# Patient Record
Sex: Male | Born: 1970
Health system: Southern US, Community
[De-identification: ages and names within clinical notes are randomized; demographics above are authoritative.]

## PROBLEM LIST (undated history)

## (undated) DIAGNOSIS — E669 Obesity, unspecified: Secondary | ICD-10-CM

## (undated) DIAGNOSIS — I639 Cerebral infarction, unspecified: Secondary | ICD-10-CM

## (undated) DIAGNOSIS — J189 Pneumonia, unspecified organism: Secondary | ICD-10-CM

## (undated) DIAGNOSIS — I5022 Chronic systolic (congestive) heart failure: Secondary | ICD-10-CM

## (undated) DIAGNOSIS — I4892 Unspecified atrial flutter: Secondary | ICD-10-CM

## (undated) DIAGNOSIS — N189 Chronic kidney disease, unspecified: Secondary | ICD-10-CM

## (undated) DIAGNOSIS — I428 Other cardiomyopathies: Secondary | ICD-10-CM

## (undated) DIAGNOSIS — Z95810 Presence of automatic (implantable) cardiac defibrillator: Secondary | ICD-10-CM

## (undated) DIAGNOSIS — I10 Essential (primary) hypertension: Secondary | ICD-10-CM

## (undated) DIAGNOSIS — I4819 Other persistent atrial fibrillation: Secondary | ICD-10-CM

## (undated) DIAGNOSIS — I219 Acute myocardial infarction, unspecified: Secondary | ICD-10-CM

## (undated) DIAGNOSIS — E119 Type 2 diabetes mellitus without complications: Secondary | ICD-10-CM

## (undated) DIAGNOSIS — E785 Hyperlipidemia, unspecified: Secondary | ICD-10-CM

## (undated) DIAGNOSIS — Z8739 Personal history of other diseases of the musculoskeletal system and connective tissue: Secondary | ICD-10-CM

## (undated) HISTORY — DX: Essential (primary) hypertension: I10

## (undated) HISTORY — DX: Obesity, unspecified: E66.9

## (undated) HISTORY — DX: Hyperlipidemia, unspecified: E78.5

## (undated) HISTORY — DX: Other cardiomyopathies: I42.8

## (undated) HISTORY — DX: Chronic systolic (congestive) heart failure: I50.22

---

## 1998-07-19 ENCOUNTER — Emergency Department (HOSPITAL_COMMUNITY): Admission: EM | Admit: 1998-07-19 | Discharge: 1998-07-19 | Payer: Self-pay | Admitting: Emergency Medicine

## 1998-07-20 ENCOUNTER — Emergency Department (HOSPITAL_COMMUNITY): Admission: EM | Admit: 1998-07-20 | Discharge: 1998-07-20 | Payer: Self-pay | Admitting: Emergency Medicine

## 1998-07-22 ENCOUNTER — Emergency Department (HOSPITAL_COMMUNITY): Admission: EM | Admit: 1998-07-22 | Discharge: 1998-07-22 | Payer: Self-pay | Admitting: Emergency Medicine

## 1998-07-25 ENCOUNTER — Emergency Department (HOSPITAL_COMMUNITY): Admission: EM | Admit: 1998-07-25 | Discharge: 1998-07-25 | Payer: Self-pay | Admitting: Emergency Medicine

## 2003-09-15 ENCOUNTER — Inpatient Hospital Stay (HOSPITAL_COMMUNITY): Admission: EM | Admit: 2003-09-15 | Discharge: 2003-09-21 | Payer: Self-pay | Admitting: Emergency Medicine

## 2003-09-16 ENCOUNTER — Encounter: Payer: Self-pay | Admitting: Cardiovascular Disease

## 2003-09-28 ENCOUNTER — Emergency Department (HOSPITAL_COMMUNITY): Admission: EM | Admit: 2003-09-28 | Discharge: 2003-09-28 | Payer: Self-pay | Admitting: Family Medicine

## 2003-10-19 ENCOUNTER — Emergency Department (HOSPITAL_COMMUNITY): Admission: EM | Admit: 2003-10-19 | Discharge: 2003-10-19 | Payer: Self-pay | Admitting: Family Medicine

## 2004-08-28 ENCOUNTER — Inpatient Hospital Stay (HOSPITAL_COMMUNITY): Admission: EM | Admit: 2004-08-28 | Discharge: 2004-09-01 | Payer: Self-pay | Admitting: Emergency Medicine

## 2004-08-29 ENCOUNTER — Encounter (INDEPENDENT_AMBULATORY_CARE_PROVIDER_SITE_OTHER): Payer: Self-pay | Admitting: Cardiovascular Disease

## 2004-11-23 ENCOUNTER — Inpatient Hospital Stay (HOSPITAL_COMMUNITY): Admission: EM | Admit: 2004-11-23 | Discharge: 2004-11-25 | Payer: Self-pay | Admitting: Emergency Medicine

## 2004-11-23 ENCOUNTER — Ambulatory Visit: Payer: Self-pay | Admitting: Internal Medicine

## 2004-12-08 ENCOUNTER — Ambulatory Visit: Payer: Self-pay | Admitting: Internal Medicine

## 2004-12-21 ENCOUNTER — Ambulatory Visit: Payer: Self-pay | Admitting: Internal Medicine

## 2004-12-22 ENCOUNTER — Ambulatory Visit: Payer: Self-pay | Admitting: Internal Medicine

## 2005-01-05 ENCOUNTER — Ambulatory Visit: Payer: Self-pay | Admitting: Hospitalist

## 2005-01-05 ENCOUNTER — Ambulatory Visit (HOSPITAL_COMMUNITY): Admission: RE | Admit: 2005-01-05 | Discharge: 2005-01-05 | Payer: Self-pay | Admitting: Internal Medicine

## 2005-01-23 ENCOUNTER — Ambulatory Visit: Payer: Self-pay | Admitting: Internal Medicine

## 2005-01-31 ENCOUNTER — Ambulatory Visit: Payer: Self-pay | Admitting: Internal Medicine

## 2005-02-08 ENCOUNTER — Ambulatory Visit: Payer: Self-pay | Admitting: Internal Medicine

## 2005-04-21 ENCOUNTER — Ambulatory Visit: Payer: Self-pay | Admitting: Internal Medicine

## 2005-06-10 ENCOUNTER — Emergency Department (HOSPITAL_COMMUNITY): Admission: AD | Admit: 2005-06-10 | Discharge: 2005-06-10 | Payer: Self-pay | Admitting: Family Medicine

## 2005-06-13 ENCOUNTER — Encounter: Payer: Self-pay | Admitting: Emergency Medicine

## 2005-06-13 ENCOUNTER — Encounter (INDEPENDENT_AMBULATORY_CARE_PROVIDER_SITE_OTHER): Payer: Self-pay | Admitting: Cardiovascular Disease

## 2005-06-13 ENCOUNTER — Ambulatory Visit: Payer: Self-pay | Admitting: Internal Medicine

## 2005-06-13 ENCOUNTER — Inpatient Hospital Stay (HOSPITAL_COMMUNITY): Admission: EM | Admit: 2005-06-13 | Discharge: 2005-06-15 | Payer: Self-pay | Admitting: Internal Medicine

## 2005-06-19 ENCOUNTER — Ambulatory Visit: Payer: Self-pay | Admitting: Hospitalist

## 2005-06-23 ENCOUNTER — Ambulatory Visit: Payer: Self-pay | Admitting: Internal Medicine

## 2005-07-19 ENCOUNTER — Ambulatory Visit: Payer: Self-pay | Admitting: Internal Medicine

## 2005-08-25 ENCOUNTER — Ambulatory Visit: Payer: Self-pay | Admitting: Internal Medicine

## 2005-12-26 DIAGNOSIS — E119 Type 2 diabetes mellitus without complications: Secondary | ICD-10-CM | POA: Insufficient documentation

## 2005-12-26 DIAGNOSIS — E1159 Type 2 diabetes mellitus with other circulatory complications: Secondary | ICD-10-CM | POA: Insufficient documentation

## 2005-12-26 DIAGNOSIS — E669 Obesity, unspecified: Secondary | ICD-10-CM | POA: Insufficient documentation

## 2005-12-26 DIAGNOSIS — E785 Hyperlipidemia, unspecified: Secondary | ICD-10-CM

## 2005-12-26 DIAGNOSIS — E1169 Type 2 diabetes mellitus with other specified complication: Secondary | ICD-10-CM | POA: Insufficient documentation

## 2005-12-26 DIAGNOSIS — I152 Hypertension secondary to endocrine disorders: Secondary | ICD-10-CM | POA: Insufficient documentation

## 2005-12-26 DIAGNOSIS — I10 Essential (primary) hypertension: Secondary | ICD-10-CM

## 2006-03-08 ENCOUNTER — Emergency Department (HOSPITAL_COMMUNITY): Admission: EM | Admit: 2006-03-08 | Discharge: 2006-03-08 | Payer: Self-pay | Admitting: Emergency Medicine

## 2006-04-20 ENCOUNTER — Telehealth: Payer: Self-pay | Admitting: *Deleted

## 2006-05-17 ENCOUNTER — Ambulatory Visit: Payer: Self-pay | Admitting: Internal Medicine

## 2006-05-17 ENCOUNTER — Encounter (INDEPENDENT_AMBULATORY_CARE_PROVIDER_SITE_OTHER): Payer: Self-pay | Admitting: Internal Medicine

## 2006-05-17 LAB — CONVERTED CEMR LAB
Blood Glucose, Fingerstick: 109
Hgb A1c MFr Bld: 6.8 %

## 2006-05-23 LAB — CONVERTED CEMR LAB
ALT: 14 units/L (ref 0–53)
AST: 17 units/L (ref 0–37)
Albumin: 4 g/dL (ref 3.5–5.2)
Alkaline Phosphatase: 98 units/L (ref 39–117)
BUN: 12 mg/dL (ref 6–23)
Basophils Absolute: 0 10*3/uL (ref 0.0–0.1)
Basophils Relative: 0 % (ref 0–1)
CO2: 25 meq/L (ref 19–32)
Calcium: 9.2 mg/dL (ref 8.4–10.5)
Chloride: 103 meq/L (ref 96–112)
Creatinine, Ser: 1.4 mg/dL (ref 0.40–1.50)
Creatinine, Urine: 186.1 mg/dL
Eosinophils Absolute: 0.2 10*3/uL (ref 0.0–0.7)
Eosinophils Relative: 4 % (ref 0–5)
Glucose, Bld: 94 mg/dL (ref 70–99)
HCT: 39 % (ref 39.0–52.0)
Hemoglobin: 12.2 g/dL — ABNORMAL LOW (ref 13.0–17.0)
Lymphocytes Relative: 32 % (ref 12–46)
Lymphs Abs: 1.7 10*3/uL (ref 0.7–3.3)
MCHC: 31.3 g/dL (ref 30.0–36.0)
MCV: 90.3 fL (ref 78.0–100.0)
Microalb Creat Ratio: 17.9 mg/g (ref 0.0–30.0)
Microalb, Ur: 3.34 mg/dL — ABNORMAL HIGH (ref 0.00–1.89)
Monocytes Absolute: 0.3 10*3/uL (ref 0.2–0.7)
Monocytes Relative: 5 % (ref 3–11)
Neutro Abs: 3.2 10*3/uL (ref 1.7–7.7)
Neutrophils Relative %: 59 % (ref 43–77)
Platelets: 323 10*3/uL (ref 150–400)
Potassium: 3.9 meq/L (ref 3.5–5.3)
RBC: 4.32 M/uL (ref 4.22–5.81)
RDW: 13.7 % (ref 11.5–14.0)
Sodium: 142 meq/L (ref 135–145)
Total Bilirubin: 0.5 mg/dL (ref 0.3–1.2)
Total Protein: 7.7 g/dL (ref 6.0–8.3)
WBC: 5.4 10*3/uL (ref 4.0–10.5)

## 2006-12-18 ENCOUNTER — Inpatient Hospital Stay (HOSPITAL_COMMUNITY): Admission: EM | Admit: 2006-12-18 | Discharge: 2006-12-20 | Payer: Self-pay | Admitting: Emergency Medicine

## 2006-12-18 ENCOUNTER — Ambulatory Visit: Payer: Self-pay | Admitting: Cardiology

## 2006-12-19 ENCOUNTER — Encounter (INDEPENDENT_AMBULATORY_CARE_PROVIDER_SITE_OTHER): Payer: Self-pay | Admitting: Internal Medicine

## 2006-12-20 ENCOUNTER — Ambulatory Visit: Payer: Self-pay | Admitting: Internal Medicine

## 2007-01-04 ENCOUNTER — Ambulatory Visit: Payer: Self-pay | Admitting: Infectious Diseases

## 2007-01-04 LAB — CONVERTED CEMR LAB
Blood Glucose, Fingerstick: 127
Hgb A1c MFr Bld: 6.6 %

## 2007-01-05 ENCOUNTER — Encounter (INDEPENDENT_AMBULATORY_CARE_PROVIDER_SITE_OTHER): Payer: Self-pay | Admitting: *Deleted

## 2007-01-05 LAB — CONVERTED CEMR LAB
BUN: 15 mg/dL (ref 6–23)
CO2: 29 meq/L (ref 19–32)
Calcium: 9.2 mg/dL (ref 8.4–10.5)
Chloride: 103 meq/L (ref 96–112)
Creatinine, Ser: 1.43 mg/dL (ref 0.40–1.50)
Glucose, Bld: 117 mg/dL — ABNORMAL HIGH (ref 70–99)
Potassium: 3.7 meq/L (ref 3.5–5.3)
Sodium: 141 meq/L (ref 135–145)

## 2007-01-11 ENCOUNTER — Ambulatory Visit: Payer: Self-pay | Admitting: Internal Medicine

## 2007-01-31 ENCOUNTER — Ambulatory Visit: Payer: Self-pay | Admitting: Internal Medicine

## 2007-01-31 ENCOUNTER — Encounter (INDEPENDENT_AMBULATORY_CARE_PROVIDER_SITE_OTHER): Payer: Self-pay | Admitting: Internal Medicine

## 2007-01-31 LAB — CONVERTED CEMR LAB: Blood Glucose, Fingerstick: 135

## 2007-02-02 LAB — CONVERTED CEMR LAB
ALT: 15 units/L (ref 0–53)
AST: 16 units/L (ref 0–37)
Albumin: 4.2 g/dL (ref 3.5–5.2)
Alkaline Phosphatase: 90 units/L (ref 39–117)
BUN: 12 mg/dL (ref 6–23)
CO2: 26 meq/L (ref 19–32)
Calcium: 9 mg/dL (ref 8.4–10.5)
Chloride: 102 meq/L (ref 96–112)
Cholesterol: 201 mg/dL — ABNORMAL HIGH (ref 0–200)
Creatinine, Ser: 1.34 mg/dL (ref 0.40–1.50)
Glucose, Bld: 99 mg/dL (ref 70–99)
HDL: 31 mg/dL — ABNORMAL LOW (ref >39)
LDL Cholesterol: 142 mg/dL — ABNORMAL HIGH (ref 0–99)
Potassium: 3.7 meq/L (ref 3.5–5.3)
Sodium: 142 meq/L (ref 135–145)
Total Bilirubin: 0.9 mg/dL (ref 0.3–1.2)
Total CHOL/HDL Ratio: 6.5
Total Protein: 7.9 g/dL (ref 6.0–8.3)
Triglycerides: 139 mg/dL (ref <150)
VLDL: 28 mg/dL (ref 0–40)

## 2007-03-27 ENCOUNTER — Ambulatory Visit: Payer: Self-pay | Admitting: Internal Medicine

## 2007-03-27 LAB — CONVERTED CEMR LAB
Blood Glucose, Fingerstick: 97
Hgb A1c MFr Bld: 6 %

## 2007-04-08 ENCOUNTER — Ambulatory Visit: Payer: Self-pay | Admitting: Internal Medicine

## 2007-04-18 ENCOUNTER — Ambulatory Visit: Payer: Self-pay | Admitting: Internal Medicine

## 2007-04-18 LAB — CONVERTED CEMR LAB
BUN: 11 mg/dL (ref 6–23)
Basophils Absolute: 0 10*3/uL (ref 0.0–0.1)
Basophils Relative: 0.5 % (ref 0.0–1.0)
CO2: 32 meq/L (ref 19–32)
Calcium: 9.3 mg/dL (ref 8.4–10.5)
Chloride: 104 meq/L (ref 96–112)
Creatinine, Ser: 1.3 mg/dL (ref 0.4–1.5)
Eosinophils Absolute: 0.7 10*3/uL — ABNORMAL HIGH (ref 0.0–0.6)
Eosinophils Relative: 10.9 % — ABNORMAL HIGH (ref 0.0–5.0)
GFR calc Af Amer: 80 mL/min
GFR calc non Af Amer: 66 mL/min
Glucose, Bld: 123 mg/dL — ABNORMAL HIGH (ref 70–99)
HCT: 42.2 % (ref 39.0–52.0)
Hemoglobin: 14 g/dL (ref 13.0–17.0)
INR: 0.9 (ref 0.8–1.0)
Lymphocytes Relative: 33.2 % (ref 12.0–46.0)
MCHC: 33.2 g/dL (ref 30.0–36.0)
MCV: 88.1 fL (ref 78.0–100.0)
Monocytes Absolute: 0.5 10*3/uL (ref 0.2–0.7)
Monocytes Relative: 8.9 % (ref 3.0–11.0)
Neutro Abs: 2.8 10*3/uL (ref 1.4–7.7)
Neutrophils Relative %: 46.5 % (ref 43.0–77.0)
Platelets: 289 10*3/uL (ref 150–400)
Potassium: 3.4 meq/L — ABNORMAL LOW (ref 3.5–5.1)
Prothrombin Time: 11.5 s (ref 10.9–13.3)
RBC: 4.79 M/uL (ref 4.22–5.81)
RDW: 14 % (ref 11.5–14.6)
Sodium: 141 meq/L (ref 135–145)
WBC: 6 10*3/uL (ref 4.5–10.5)
aPTT: 31.8 s — ABNORMAL HIGH (ref 21.7–29.8)

## 2007-04-24 ENCOUNTER — Observation Stay (HOSPITAL_COMMUNITY): Admission: RE | Admit: 2007-04-24 | Discharge: 2007-04-25 | Payer: Self-pay | Admitting: Internal Medicine

## 2007-04-24 ENCOUNTER — Ambulatory Visit: Payer: Self-pay | Admitting: Internal Medicine

## 2007-05-06 ENCOUNTER — Ambulatory Visit: Payer: Self-pay | Admitting: Internal Medicine

## 2007-05-22 ENCOUNTER — Ambulatory Visit: Payer: Self-pay

## 2007-06-05 ENCOUNTER — Encounter (INDEPENDENT_AMBULATORY_CARE_PROVIDER_SITE_OTHER): Payer: Self-pay | Admitting: Internal Medicine

## 2007-06-05 ENCOUNTER — Ambulatory Visit: Payer: Self-pay | Admitting: Hospitalist

## 2007-06-05 LAB — CONVERTED CEMR LAB
Amphetamine Screen, Ur: NEGATIVE
Barbiturate Quant, Ur: NEGATIVE
Benzodiazepines.: NEGATIVE
Blood Glucose, Fingerstick: 144
Cocaine Metabolites: NEGATIVE
Creatinine, Urine: 54.1 mg/dL
Creatinine,U: 53.4 mg/dL
Marijuana Metabolite: NEGATIVE
Methadone: NEGATIVE
Microalb Creat Ratio: 37.2 mg/g — ABNORMAL HIGH
Microalb, Ur: 2.01 mg/dL — ABNORMAL HIGH
Opiates: NEGATIVE
Phencyclidine (PCP): NEGATIVE
Propoxyphene: NEGATIVE

## 2007-07-30 ENCOUNTER — Ambulatory Visit: Payer: Self-pay | Admitting: Internal Medicine

## 2007-08-28 ENCOUNTER — Encounter (INDEPENDENT_AMBULATORY_CARE_PROVIDER_SITE_OTHER): Payer: Self-pay | Admitting: Internal Medicine

## 2007-08-28 ENCOUNTER — Ambulatory Visit: Payer: Self-pay | Admitting: *Deleted

## 2007-08-28 LAB — CONVERTED CEMR LAB
ALT: 18 units/L (ref 0–53)
AST: 22 units/L (ref 0–37)
Albumin: 4.2 g/dL (ref 3.5–5.2)
Alkaline Phosphatase: 80 units/L (ref 39–117)
BUN: 12 mg/dL (ref 6–23)
CO2: 26 meq/L (ref 19–32)
Calcium: 9 mg/dL (ref 8.4–10.5)
Chloride: 102 meq/L (ref 96–112)
Creatinine, Ser: 1.37 mg/dL (ref 0.40–1.50)
Glucose, Bld: 83 mg/dL (ref 70–99)
Potassium: 3.6 meq/L (ref 3.5–5.3)
Sodium: 142 meq/L (ref 135–145)
Total Bilirubin: 1.1 mg/dL (ref 0.3–1.2)
Total Protein: 7.8 g/dL (ref 6.0–8.3)

## 2007-09-18 ENCOUNTER — Telehealth: Payer: Self-pay | Admitting: Infectious Diseases

## 2007-10-07 ENCOUNTER — Ambulatory Visit: Payer: Self-pay | Admitting: Internal Medicine

## 2007-10-07 ENCOUNTER — Ambulatory Visit: Payer: Self-pay

## 2007-10-07 ENCOUNTER — Encounter: Payer: Self-pay | Admitting: Internal Medicine

## 2007-10-07 LAB — CONVERTED CEMR LAB
ALT: 15 units/L (ref 0–53)
AST: 14 units/L (ref 0–37)
Albumin: 4.4 g/dL (ref 3.5–5.2)
Alkaline Phosphatase: 83 units/L (ref 39–117)
BUN: 13 mg/dL (ref 6–23)
CO2: 23 meq/L (ref 19–32)
Calcium: 9.2 mg/dL (ref 8.4–10.5)
Chloride: 106 meq/L (ref 96–112)
Creatinine, Ser: 1.41 mg/dL (ref 0.40–1.50)
Glucose, Bld: 110 mg/dL — ABNORMAL HIGH (ref 70–99)
Potassium: 3.8 meq/L (ref 3.5–5.3)
Sodium: 142 meq/L (ref 135–145)
Total Bilirubin: 0.6 mg/dL (ref 0.3–1.2)
Total Protein: 7.9 g/dL (ref 6.0–8.3)

## 2007-11-27 ENCOUNTER — Telehealth (INDEPENDENT_AMBULATORY_CARE_PROVIDER_SITE_OTHER): Payer: Self-pay | Admitting: *Deleted

## 2007-12-06 ENCOUNTER — Telehealth: Payer: Self-pay | Admitting: Internal Medicine

## 2007-12-06 ENCOUNTER — Ambulatory Visit: Payer: Self-pay | Admitting: Internal Medicine

## 2007-12-06 LAB — CONVERTED CEMR LAB
Blood Glucose, Fingerstick: 198
Hgb A1c MFr Bld: 6.7 %

## 2007-12-12 ENCOUNTER — Ambulatory Visit: Payer: Self-pay

## 2008-01-13 ENCOUNTER — Telehealth: Payer: Self-pay | Admitting: *Deleted

## 2008-01-21 ENCOUNTER — Encounter: Payer: Self-pay | Admitting: Internal Medicine

## 2008-02-14 ENCOUNTER — Ambulatory Visit: Payer: Self-pay | Admitting: Internal Medicine

## 2008-02-14 LAB — CONVERTED CEMR LAB: Blood Glucose, Fingerstick: 466

## 2008-02-18 ENCOUNTER — Encounter: Payer: Self-pay | Admitting: Internal Medicine

## 2008-02-18 ENCOUNTER — Ambulatory Visit: Payer: Self-pay | Admitting: Internal Medicine

## 2008-02-18 ENCOUNTER — Telehealth: Payer: Self-pay | Admitting: Internal Medicine

## 2008-02-18 LAB — CONVERTED CEMR LAB
ALT: 18 units/L (ref 0–53)
AST: 13 units/L (ref 0–37)
Albumin: 4.1 g/dL (ref 3.5–5.2)
Alkaline Phosphatase: 121 units/L — ABNORMAL HIGH (ref 39–117)
BUN: 12 mg/dL (ref 6–23)
CO2: 26 meq/L (ref 19–32)
Calcium: 9.5 mg/dL (ref 8.4–10.5)
Chloride: 100 meq/L (ref 96–112)
Cholesterol: 194 mg/dL (ref 0–200)
Creatinine, Ser: 1.28 mg/dL (ref 0.40–1.50)
Glucose, Bld: 468 mg/dL — ABNORMAL HIGH (ref 70–99)
HDL: 33 mg/dL — ABNORMAL LOW (ref >39)
Hgb A1c MFr Bld: 10.7 %
LDL Cholesterol: 125 mg/dL — ABNORMAL HIGH (ref 0–99)
Potassium: 3.7 meq/L (ref 3.5–5.3)
Sodium: 137 meq/L (ref 135–145)
Total Bilirubin: 0.6 mg/dL (ref 0.3–1.2)
Total CHOL/HDL Ratio: 5.9
Total Protein: 7.6 g/dL (ref 6.0–8.3)
Triglycerides: 181 mg/dL — ABNORMAL HIGH (ref <150)
VLDL: 36 mg/dL (ref 0–40)

## 2008-02-24 ENCOUNTER — Ambulatory Visit: Payer: Self-pay | Admitting: Internal Medicine

## 2008-02-24 LAB — CONVERTED CEMR LAB: Blood Glucose, Fingerstick: 472

## 2008-03-09 ENCOUNTER — Ambulatory Visit: Payer: Self-pay | Admitting: Infectious Diseases

## 2008-03-09 LAB — CONVERTED CEMR LAB: Blood Glucose, Fingerstick: 426

## 2008-03-18 ENCOUNTER — Ambulatory Visit: Payer: Self-pay

## 2008-03-20 ENCOUNTER — Telehealth (INDEPENDENT_AMBULATORY_CARE_PROVIDER_SITE_OTHER): Payer: Self-pay | Admitting: *Deleted

## 2008-03-25 ENCOUNTER — Ambulatory Visit: Payer: Self-pay | Admitting: Internal Medicine

## 2008-03-25 LAB — CONVERTED CEMR LAB: Blood Glucose, Fingerstick: 519

## 2008-04-24 ENCOUNTER — Encounter: Payer: Self-pay | Admitting: Internal Medicine

## 2008-04-29 ENCOUNTER — Encounter: Payer: Self-pay | Admitting: Internal Medicine

## 2008-04-29 ENCOUNTER — Ambulatory Visit: Payer: Self-pay | Admitting: Internal Medicine

## 2008-04-29 LAB — CONVERTED CEMR LAB
BUN: 12 mg/dL (ref 6–23)
Blood Glucose, Fingerstick: 368
Blood Glucose, Home Monitor: 2 mg/dL
CO2: 25 meq/L (ref 19–32)
Calcium: 9 mg/dL (ref 8.4–10.5)
Chloride: 100 meq/L (ref 96–112)
Creatinine, Ser: 1.06 mg/dL (ref 0.40–1.50)
Glucose, Bld: 391 mg/dL — ABNORMAL HIGH (ref 70–99)
Hgb A1c MFr Bld: 14 %
Potassium: 4.1 meq/L (ref 3.5–5.3)
Sodium: 137 meq/L (ref 135–145)

## 2008-05-06 ENCOUNTER — Telehealth: Payer: Self-pay | Admitting: Internal Medicine

## 2008-06-01 ENCOUNTER — Ambulatory Visit: Payer: Self-pay | Admitting: Internal Medicine

## 2008-06-01 LAB — CONVERTED CEMR LAB: Blood Glucose, Fingerstick: 573

## 2008-06-03 ENCOUNTER — Telehealth (INDEPENDENT_AMBULATORY_CARE_PROVIDER_SITE_OTHER): Payer: Self-pay | Admitting: Internal Medicine

## 2008-06-04 ENCOUNTER — Telehealth: Payer: Self-pay | Admitting: Internal Medicine

## 2008-06-18 ENCOUNTER — Encounter (INDEPENDENT_AMBULATORY_CARE_PROVIDER_SITE_OTHER): Payer: Self-pay | Admitting: Internal Medicine

## 2008-06-18 ENCOUNTER — Ambulatory Visit: Payer: Self-pay | Admitting: Internal Medicine

## 2008-06-18 ENCOUNTER — Encounter: Payer: Self-pay | Admitting: Internal Medicine

## 2008-06-18 LAB — CONVERTED CEMR LAB: Blood Glucose, Fingerstick: 600

## 2008-06-19 LAB — CONVERTED CEMR LAB
ALT: 13 units/L (ref 0–53)
AST: 9 units/L (ref 0–37)
Albumin: 3.9 g/dL (ref 3.5–5.2)
Alkaline Phosphatase: 128 units/L — ABNORMAL HIGH (ref 39–117)
Amphetamine Screen, Ur: NEGATIVE
Anti Nuclear Antibody(ANA): NEGATIVE
BUN: 11 mg/dL (ref 6–23)
Barbiturate Quant, Ur: NEGATIVE
Benzodiazepines.: NEGATIVE
CO2: 25 meq/L (ref 19–32)
Calcium: 9 mg/dL (ref 8.4–10.5)
Chloride: 97 meq/L (ref 96–112)
Cholesterol: 208 mg/dL — ABNORMAL HIGH (ref 0–200)
Cocaine Metabolites: NEGATIVE
Cortisol, Plasma: 19.1 ug/dL
Creatinine, Ser: 1.33 mg/dL (ref 0.40–1.50)
Creatinine,U: 35.2 mg/dL
GFR calc Af Amer: >60 mL/min (ref >60)
GFR calc non Af Amer: >60 mL/min (ref >60)
Glucose, Bld: 614 mg/dL (ref 70–99)
HDL: 42 mg/dL (ref >39)
LDL Cholesterol: 124 mg/dL — ABNORMAL HIGH (ref 0–99)
Marijuana Metabolite: NEGATIVE
Methadone: NEGATIVE
Opiates: NEGATIVE
Phencyclidine (PCP): NEGATIVE
Potassium: 3.9 meq/L (ref 3.5–5.3)
Propoxyphene: NEGATIVE
Rheumatoid fact SerPl-aCnc: <20 intl units/mL (ref 0–20)
Sodium: 134 meq/L — ABNORMAL LOW (ref 135–145)
Total Bilirubin: 0.5 mg/dL (ref 0.3–1.2)
Total CHOL/HDL Ratio: 5
Total Protein: 7.1 g/dL (ref 6.0–8.3)
Triglycerides: 209 mg/dL — ABNORMAL HIGH (ref <150)
VLDL: 42 mg/dL — ABNORMAL HIGH (ref 0–40)

## 2008-06-30 ENCOUNTER — Ambulatory Visit: Payer: Self-pay | Admitting: Internal Medicine

## 2008-06-30 LAB — CONVERTED CEMR LAB: Blood Glucose, Fingerstick: 224

## 2008-07-03 DIAGNOSIS — Z95810 Presence of automatic (implantable) cardiac defibrillator: Secondary | ICD-10-CM | POA: Insufficient documentation

## 2008-07-03 DIAGNOSIS — I5020 Unspecified systolic (congestive) heart failure: Secondary | ICD-10-CM | POA: Insufficient documentation

## 2008-07-03 DIAGNOSIS — I429 Cardiomyopathy, unspecified: Secondary | ICD-10-CM | POA: Insufficient documentation

## 2008-07-08 ENCOUNTER — Ambulatory Visit: Payer: Self-pay

## 2008-07-08 ENCOUNTER — Encounter: Payer: Self-pay | Admitting: Internal Medicine

## 2008-10-19 ENCOUNTER — Encounter: Payer: Self-pay | Admitting: Internal Medicine

## 2008-10-20 ENCOUNTER — Ambulatory Visit: Payer: Self-pay | Admitting: Internal Medicine

## 2008-10-20 LAB — CONVERTED CEMR LAB
Blood Glucose, Fingerstick: 271
Hgb A1c MFr Bld: 11.7 %

## 2008-10-29 DIAGNOSIS — E876 Hypokalemia: Secondary | ICD-10-CM | POA: Insufficient documentation

## 2008-10-29 LAB — CONVERTED CEMR LAB
ALT: 12 units/L (ref 0–53)
AST: 13 units/L (ref 0–37)
Albumin: 4.5 g/dL (ref 3.5–5.2)
Alkaline Phosphatase: 76 units/L (ref 39–117)
BUN: 9 mg/dL (ref 6–23)
CO2: 23 meq/L (ref 19–32)
Calcium: 9 mg/dL (ref 8.4–10.5)
Chloride: 102 meq/L (ref 96–112)
Cholesterol: 192 mg/dL (ref 0–200)
Creatinine, Ser: 1.21 mg/dL (ref 0.40–1.50)
Glucose, Bld: 251 mg/dL — ABNORMAL HIGH (ref 70–99)
HDL: 43 mg/dL (ref >39)
LDL Cholesterol: 116 mg/dL — ABNORMAL HIGH (ref 0–99)
Potassium: 3.4 meq/L — ABNORMAL LOW (ref 3.5–5.3)
Sodium: 140 meq/L (ref 135–145)
Total Bilirubin: 0.7 mg/dL (ref 0.3–1.2)
Total CHOL/HDL Ratio: 4.5
Total Protein: 7.8 g/dL (ref 6.0–8.3)
Triglycerides: 165 mg/dL — ABNORMAL HIGH (ref <150)
VLDL: 33 mg/dL (ref 0–40)

## 2008-11-09 ENCOUNTER — Encounter: Payer: Self-pay | Admitting: Internal Medicine

## 2008-11-09 ENCOUNTER — Ambulatory Visit: Payer: Self-pay | Admitting: Internal Medicine

## 2008-11-12 ENCOUNTER — Telehealth: Payer: Self-pay | Admitting: Internal Medicine

## 2008-12-02 ENCOUNTER — Telehealth: Payer: Self-pay | Admitting: Licensed Clinical Social Worker

## 2009-01-29 ENCOUNTER — Encounter (INDEPENDENT_AMBULATORY_CARE_PROVIDER_SITE_OTHER): Payer: Self-pay | Admitting: *Deleted

## 2009-02-23 ENCOUNTER — Ambulatory Visit: Payer: Self-pay | Admitting: Internal Medicine

## 2009-02-23 LAB — CONVERTED CEMR LAB
CO2: 22 meq/L (ref 19–32)
Chloride: 100 meq/L (ref 96–112)
Creatinine, Ser: 1.15 mg/dL (ref 0.40–1.50)
Hgb A1c MFr Bld: 8 %
Microalb Creat Ratio: 248.9 mg/g — ABNORMAL HIGH (ref 0.0–30.0)

## 2009-03-04 ENCOUNTER — Encounter: Payer: Self-pay | Admitting: Internal Medicine

## 2009-03-04 ENCOUNTER — Ambulatory Visit: Payer: Self-pay

## 2009-03-29 ENCOUNTER — Telehealth: Payer: Self-pay | Admitting: Internal Medicine

## 2009-04-13 ENCOUNTER — Ambulatory Visit: Payer: Self-pay | Admitting: Internal Medicine

## 2009-04-14 LAB — CONVERTED CEMR LAB
Albumin: 4.1 g/dL (ref 3.5–5.2)
Alkaline Phosphatase: 81 units/L (ref 39–117)
BUN: 16 mg/dL (ref 6–23)
CO2: 25 meq/L (ref 19–32)
Calcium: 9.2 mg/dL (ref 8.4–10.5)
Cholesterol: 196 mg/dL (ref 0–200)
Glucose, Bld: 139 mg/dL — ABNORMAL HIGH (ref 70–99)
HDL: 39 mg/dL — ABNORMAL LOW (ref >39)
LDL Cholesterol: 136 mg/dL — ABNORMAL HIGH (ref 0–99)
Potassium: 4.2 meq/L (ref 3.5–5.3)
Triglycerides: 107 mg/dL (ref <150)

## 2009-04-27 ENCOUNTER — Telehealth: Payer: Self-pay | Admitting: Licensed Clinical Social Worker

## 2009-05-04 ENCOUNTER — Encounter: Payer: Self-pay | Admitting: Licensed Clinical Social Worker

## 2009-07-05 ENCOUNTER — Encounter: Payer: Self-pay | Admitting: Internal Medicine

## 2009-07-21 ENCOUNTER — Encounter (INDEPENDENT_AMBULATORY_CARE_PROVIDER_SITE_OTHER): Payer: Self-pay | Admitting: *Deleted

## 2009-12-10 ENCOUNTER — Encounter (INDEPENDENT_AMBULATORY_CARE_PROVIDER_SITE_OTHER): Payer: Self-pay | Admitting: *Deleted

## 2009-12-24 ENCOUNTER — Encounter: Payer: Self-pay | Admitting: Internal Medicine

## 2010-02-15 ENCOUNTER — Ambulatory Visit: Payer: Self-pay | Admitting: Internal Medicine

## 2010-02-18 ENCOUNTER — Telehealth: Payer: Self-pay | Admitting: Internal Medicine

## 2010-03-16 ENCOUNTER — Ambulatory Visit: Admit: 2010-03-16 | Payer: Self-pay

## 2010-04-14 NOTE — Miscellaneous (Signed)
Summary: [facility] [employer]   Imported By: Garlan Fillers 07/08/2009 11:29:47  _____________________________________________________________________  External Attachment:    Type:   Image     Comment:   External Document

## 2010-04-14 NOTE — Cardiovascular Report (Signed)
Summary: Office Visit   Office Visit   Imported By: Sallee Provencal 02/24/2010 11:57:01  _____________________________________________________________________  External Attachment:    Type:   Image     Comment:   External Document

## 2010-04-14 NOTE — Progress Notes (Signed)
Summary: refill/ hla  Phone Note Refill Request Message from:  Fax from Pharmacy on March 29, 2009 4:47 PM  Refills Requested: Medication #1:  METFORMIN HCL 1000 MG TABS Take 1 tab by mouth two times a day   Last Refilled: 10/05/2008 Initial call taken by: Freddy Finner RN,  March 29, 2009 4:47 PM  Follow-up for Phone Call        Refill approved-nurse to complete    Prescriptions: METFORMIN HCL 1000 MG TABS (METFORMIN HCL) Take 1 tab by mouth two times a day  #60 x 6   Entered and Authorized by:   Barton Dubois MD   Signed by:   Barton Dubois MD on 03/29/2009   Method used:   Electronically to        [facility] (retail)       [address]       [place], [place]  [address]       Ph: [phone]       Fax: [phone]   RxID:   [id number]

## 2010-04-14 NOTE — Assessment & Plan Note (Signed)
Summary: EST-CK/FU/MEDS/CFB   Vital Signs:  Patient profile:   40 year old male Height:      71 inches (180.34 cm) Weight:      250.9 pounds (114.05 kg) BMI:     35.12 Temp:     98.0 degrees F (36.67 degrees C) oral Pulse rate:   71 / minute BP sitting:   146 / 98  (right arm)  Vitals Entered By: Hilda Blades Ditzler RN (April 13, 2009 10:52 AM) Is Patient Diabetic? Yes Did you bring your meter with you today? No Pain Assessment Patient in pain? no      Nutritional Status BMI of > 30 = obese Nutritional Status Detail appetite good CBG Result 129  Have you ever been in a relationship where you felt threatened, hurt or afraid?denies   Does patient need assistance? Functional Status Self care Ambulation Normal Comments Ck-up.   Diabetic Foot Exam Last Podiatry Exam Date: 01/04/2007 Foot Inspection Is there a history of a foot ulcer?              No Is there a foot ulcer now?              No Can the patient see the bottom of their feet?          Yes Are the shoes appropriate in style and fit?          Yes Is there swelling or an abnormal foot shape?          No Are the toenails long?                Yes Are the toenails thick?                No Are the toenails ingrown?              No Is there heavy callous build-up?              No Is there pain in the calf muscle (Intermittent claudication) when walking?    NoIs there a claw toe deformity?              No Is there elevated skin temperature?            No Is there limited ankle dorsiflexion?            No Is there foot or ankle muscle weakness?            No  Diabetic Foot Care Education Patient educated on appropriate care of diabetic feet.  Pulse Check          Right Foot          Left Foot Posterior Tibial:        normal            normal Dorsalis Pedis:        normal            normal  High Risk Feet? No   10-g (5.07) Semmes-Weinstein Monofilament Test           Right Foot          Left Foot Visual Inspection                Test Control      normal         normal Site 1         normal         normal Site 2  normal         normal Site 3         normal         normal Site 4         normal         normal Site 5         normal         normal Site 6         normal         normal Site 7         normal         normal Site 8         normal         normal Site 9         normal         normal Site 10         normal         normal  Impression      normal         normal   Primary Care Provider:  Barton Dubois MD   History of Present Illness: 40 years old male with pmh as described on the EMR; who comes to the clinic for followup of her HTN, DM and HLD.  Pt denies any complaints today and reports to be taking all his meds, especially his insulin two times a day. Pt BP is much betterin comparisson to BP during previous visit and he endorses feeling better.  Pt has been exercising and following recomendations for better control of his blood sugar and reports that while checking cbg's at home the highest level obtained was in the 160's range (postprandial).  BP today was 146/98 and his CBG was 129.  Depression History:      The patient denies a depressed mood most of the day and a diminished interest in his usual daily activities.        The patient denies that he feels like life is not worth living, denies that he wishes that he were dead, and denies that he has thought about ending his life.         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) 5)  Dyslipidemia  (ICD-272.4) 6)   Hypertension  (ICD-401.9) 7)  Obesity  (ICD-278.00) 8)  Diabetes Mellitus, Type II  (ICD-250.00) 9)  Inadequate Material Resources  (ICD-V60.2)  Current Problems (verified): 1)  Hypokalemia  ([id number]) 2)  Systolic Heart Failure, Chronic  (ICD-428.22) 3)  Cardiomyopathy, Secondary  (ICD-425.9) 4)  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 Take 1 Tablet By Mouth Twice A Day 2)  Lisinopril 40 Mg Tabs (Lisinopril) .... Take 1 Tablet By Mouth Once A Day 3)  Carvedilol 25 Mg  Tabs (Carvedilol) .... Take 1 Tablet By Mouth Two Times A Day 4)  Aspirin 325 Mg Tabs (Aspirin) .... Take 1 Tablet By Mouth Once A Day 5)  Metformin Hcl 1000 Mg Tabs (Metformin Hcl) .... Take 1 Tab By Mouth Two Times A Day 6)  Pravachol 40 Mg  Tabs (Pravastatin Sodium) .... Take 1 Tab By Mouth At Bedtime 7)  Insulin Syringe 31g X 5/16" 0.5 Ml Misc (Insulin Syringe-Needle U-100) .... Use To Inject Insulin Before Breakfast and Dinner 8)  Relion 70/30 70-30 % Susp (Insulin Isophane & Regular) .... Inject 40 Units Under Skin 30 Minutes Before Breakfast and Inject 25 Units Under Skin 30 Minutes Before Evening Meal 9)  Clonidine Hcl 0.2 Mg Tabs (Clonidine Hcl) .... Take 1 Tablet By Mouth Two Times A Day 10)  Amlodipine Besylate 10 Mg Tabs (Amlodipine Besylate) .... Take 1 Tablet By Mouth Once A Day  Current Medications (verified): 1)  Furosemide 80 Mg Tabs (Furosemide) .... Take 1 Tablet By Mouth Twice A Day 2)  Lisinopril 40 Mg Tabs (Lisinopril) .... Take 1 Tablet By Mouth Once A Day 3)  Carvedilol 25 Mg  Tabs (Carvedilol) .... Take 1 Tablet By Mouth Two Times A Day 4)  Aspirin 325 Mg Tabs (Aspirin) .... Take 1 Tablet By Mouth Once A Day 5)  Metformin Hcl 1000 Mg Tabs (Metformin Hcl)  .... Take 1 Tab By Mouth Two Times A Day 6)  Pravachol 40 Mg  Tabs (Pravastatin Sodium) .... Take 1 Tab By Mouth At Bedtime 7)  Insulin Syringe 31g X 5/16" 0.5 Ml Misc (Insulin Syringe-Needle U-100) .... Use To Inject Insulin Before Breakfast and Dinner 8)  Relion 70/30 70-30 % Susp (Insulin Isophane & Regular) .... Inject 40 Units Under Skin 30 Minutes Before Breakfast and Inject 25 Units Under Skin 30 Minutes Before Evening Meal 9)  Clonidine Hcl 0.2 Mg Tabs (Clonidine Hcl) .... Take 1 Tablet By Mouth Two Times A Day 10)  Amlodipine Besylate 10 Mg Tabs (Amlodipine Besylate) .... Take 1 Tablet By Mouth Once A Day  Allergies (verified): No Known Drug Allergies  Past History:  Past Medical History: Last updated: [id number] SYSTOLIC HEART FAILURE, CHRONIC (ICD-428.22) CARDIOMYOPATHY, SECONDARY (ICD-425.9) ICD - IN SITU (ICD-V45.02) DYSLIPIDEMIA (ICD-272.4) HYPERTENSION (ICD-401.9) OBESITY (ICD-278.00) DIABETES MELLITUS, TYPE II (ICD-250.00) INADEQUATE MATERIAL RESOURCES (ICD-V60.2)    Past Surgical History: Last updated: 07/03/2008 ICD - [name]. Lovena Le, MD - 04/24/07 - Atlas II VR from [place] for nonischemic cardiomyopathy and   class II heart failure  Family History: Last updated: 07/03/2008 Family History of Diabetes:  Family History of Hypertension:   Social History: Last updated: 07/03/2008 Tobacco Use - No.  Alcohol Use - no Drug Use - no  Risk Factors: Alcohol Use: 0 (04/13/2009) Exercise: yes (04/13/2009)  Risk Factors: Smoking Status: never (04/13/2009) Passive Smoke Exposure: no (04/13/2009)  Review of Systems  The patient denies anorexia, fever, chest pain, syncope, dyspnea on exertion, peripheral edema, headaches, abdominal pain, melena, hematochezia, severe indigestion/heartburn, hematuria, unusual weight change, and abnormal bleeding.    Physical Exam  General:  alert, well-developed, well-nourished, and well-hydrated.   Lungs:  Normal respiratory  effort, chest expands symmetrically. Lungs are clear to auscultation, no crackles or wheezes. Heart:  Normal rate and regular rhythm. S1 and S2 normal without gallop, murmur, click, rub or other extra sounds. Abdomen:  Bowel sounds positive,abdomen soft and non-tender without masses, organomegaly or hernias noted. Pulses:  R and L carotid,radial,femoral,dorsalis pedis and  posterior tibial pulses are full and equal bilaterally Extremities:  No clubbing, cyanosis, edema, or deformity noted with normal full range of motion of all joints.   Neurologic:  Cranial nerves intact. Plantar reflexes are down-going bilaterally. DTRs are symmetrical throughout. Sensory, motor and coordinative functions appear intact. Normal Gait.  Diabetes Management Exam:    Foot Exam (with socks and/or shoes not present):       Sensory-Monofilament:          Left foot: normal          Right foot: normal   Impression & Recommendations:  Problem # 1:  CARDIOMYOPATHY, SECONDARY (ICD-425.9) Assessment Comment Only Pt denies chest pain, SOB, peripheral edema or dyspnea on exertion. He has is icd in place and follow closely by Dr. Lovena Le. Will continue controlling his BP and also having a good control of his diabetes and HLD. Patient advised to follow a low sodium diet and to be compliant with his medications. Will continue lisinopril, coreg, lasix and aspirin.  Problem # 2:  DYSLIPIDEMIA (ICD-272.4) Will check lipid profile and also LFT's today. Pt will continue statins and the dose would be adjusted as needed upon results. Patient advised to follow a low fat diet.   His updated medication list for this problem includes:    Pravachol 40 Mg Tabs (Pravastatin sodium) .Marland Kitchen... Take 1 tab by mouth at bedtime  Orders: T-Lipid Profile [id number])  Problem # 3:  HYPERTENSION (ICD-401.9) Patient is now compliant with his medications and motivated to get his health back. BP is improved today. Will continue current medication  regimen; will check renal function and also electrolytes status. Pt advised to follow a low sodium diet and to lose weight.  His updated medication list for this problem includes:    Furosemide 80 Mg Tabs (Furosemide) .Marland Kitchen... Take 1 tablet by mouth twice a day    Lisinopril 40 Mg Tabs (Lisinopril) .Marland Kitchen... Take 1 tablet by mouth once a day    Carvedilol 25 Mg Tabs (Carvedilol) .Marland Kitchen... Take 1 tablet by mouth two times a day    Clonidine Hcl 0.2 Mg Tabs (Clonidine hcl) .Marland Kitchen... Take 1 tablet by mouth two times a day    Amlodipine Besylate 10 Mg Tabs (Amlodipine besylate) .Marland Kitchen... Take 1 tablet by mouth once a day  Orders: T-Comprehensive Metabolic Panel ([id number])  Problem # 4:  DIABETES MELLITUS, TYPE II (ICD-250.00) Patient willcontinue using metformin and insulin. Last A1C was 8.0; will check A1C leveltoday and will adjust his insulin as needed. CBG in the office was 129.  His updated medication list for this problem includes:    Lisinopril 40 Mg Tabs (Lisinopril) .Marland Kitchen... Take 1 tablet by mouth once a day    Aspirin 325 Mg Tabs (Aspirin) .Marland Kitchen... Take 1 tablet by mouth once a day    Metformin Hcl 1000 Mg Tabs (Metformin hcl) .Marland Kitchen... Take 1 tab by mouth two times a day    Relion 70/30 70-30 % Susp (Insulin isophane & regular) ..... Inject 40 units under skin 30 minutes before breakfast and inject 25 units under skin 30 minutes before evening meal  Orders: Capillary Blood Glucose/CBG [id number]) Ophthalmology Referral (Ophthalmology) T-Hgb A1C (in-house) [id number])  Complete Medication List: 1)  Furosemide 80 Mg Tabs (Furosemide) .... Take 1 tablet by mouth twice a day 2)  Lisinopril 40 Mg Tabs (Lisinopril) .... Take 1 tablet by mouth once a day 3)  Carvedilol 25 Mg Tabs (Carvedilol) .... Take 1 tablet by mouth two times a day 4)  Aspirin 325 Mg Tabs (Aspirin) .... Take 1 tablet by mouth once a day 5)  Metformin Hcl 1000 Mg Tabs (Metformin hcl) .... Take 1 tab by mouth two times a day 6)  Pravachol 40 Mg  Tabs (Pravastatin sodium) .... Take 1 tab by mouth at bedtime 7)  Insulin Syringe 31g X 5/16" 0.5 Ml Misc (Insulin syringe-needle u-100) .... Use to inject insulin before breakfast and dinner 8)  Relion 70/30 70-30 % Susp (Insulin isophane & regular) .... Inject 40 units under skin 30 minutes before breakfast and inject 25 units under skin 30 minutes before evening meal 9)  Clonidine Hcl 0.2 Mg Tabs (Clonidine hcl) .... Take 1 tablet by mouth two times a day 10)  Amlodipine Besylate 10 Mg Tabs (Amlodipine besylate) .... Take 1 tablet by mouth once a day  Patient Instructions: 1)  Please schedule a follow-up appointment in 3 months. 2)  Take your medications as prescribed. 3)  Excellent job with your BP control, keep with the good job. 4)  Follow a low sodium, low fat and low carbohydrates diet. 5)  You will be called with any abnormalities in the tests scheduled or performed today.  If you don't hear from [place] within a week from when the test was performed, you can assume that your test was normal. Process Orders Check Orders Results:     [facility]: ABN not required for this insurance Tests Sent for requisitioning (April 13, 2009 11:42 AM):     04/13/2009: [facility] -- T-Lipid Profile [id number] (signed)     04/13/2009: [facility] -- T-Comprehensive Metabolic Panel [id number] (signed)    Prevention & Chronic Care Immunizations   Influenza vaccine: Fluvax 3+  (02/23/2009)   Influenza vaccine deferral: Deferred  (04/13/2009)    Tetanus booster: Not documented   Td booster deferral: Deferred  (04/13/2009)    Pneumococcal vaccine: Not documented   Pneumococcal vaccine deferral: Deferred  (04/13/2009)    Immunization comments: Patient will wait untilnext visit in March in order to get his T-DAP shot.  Other Screening   Smoking status: never  (04/13/2009)  Diabetes Mellitus   HgbA1C: 8.0  (02/23/2009)   HgbA1C  action/deferral: Ordered  (04/13/2009)    Eye exam: No diabetic retinopathy.   Visual acuity OD (best corrected):     20/200 Visual acuity OS (best corrected):     20/30   (01/21/2008)   Diabetic eye exam action/deferral: Ophthalmology referral  (04/13/2009)   Eye exam due: 01/2009    Foot exam: yes  (04/13/2009)   Foot exam action/deferral: Do today   High risk foot: No  (04/13/2009)   Foot care education: Done  (04/13/2009)    Urine microalbumin/creatinine ratio: 248.9  (02/23/2009)   Urine microalbumin action/deferral: Ordered    Diabetes flowsheet reviewed?: Yes   Progress toward A1C goal: Improved  Lipids   Total Cholesterol: 192  (10/20/2008)   Lipid panel action/deferral: Lipid Panel ordered   LDL: 116  (10/20/2008)   LDL Direct: Not documented   HDL: 43  (10/20/2008)   Triglycerides: 165  (10/20/2008)    SGOT (AST): 13  (10/20/2008)   BMP action: Ordered   SGPT (ALT): 12  (10/20/2008) CMP ordered    Alkaline phosphatase: 76  (10/20/2008)   Total bilirubin: 0.7  (10/20/2008)    Lipid flowsheet reviewed?: Yes   Progress toward LDL goal: Improved  Hypertension   Last Blood Pressure: 146 / 98  (04/13/2009)   Serum creatinine: 1.15  (  02/23/2009)   BMP action: Ordered   Serum potassium 3.7  (02/23/2009) CMP ordered     Hypertension flowsheet reviewed?: Yes   Progress toward BP goal: Improved  Self-Management Support :   Personal Goals (by the next clinic visit) :     Personal A1C goal: 7  (02/23/2009)     Personal blood pressure goal: 130/80  (04/13/2009)     Personal LDL goal: 100  (02/23/2009)    Patient will work on the following items until the next clinic visit to reach self-care goals:     Medications and monitoring: take my medicines every day, check my blood sugar, examine my feet every day  (04/13/2009)     Eating: drink diet soda or water instead of juice or soda, eat more vegetables, use fresh or frozen vegetables, eat foods that are low in salt,  eat baked foods instead of fried foods, eat fruit for snacks and desserts, limit or avoid alcohol  (04/13/2009)     Activity: take a 30 minute walk every day, park at the far end of the parking lot  (04/13/2009)     Home glucose monitoring frequency: 2 times a day  (02/23/2009)    Diabetes self-management support: Written self-care plan  (04/13/2009)   Diabetes care plan printed   Last diabetes self-management training by diabetes educator: 06/30/2008   Last medical nutrition therapy: 02/24/2008    Hypertension self-management support: Written self-care plan  (04/13/2009)   Hypertension self-care plan printed.    Lipid self-management support: Written self-care plan  (04/13/2009)   Lipid self-care plan printed.   Nursing Instructions: Diabetic foot exam today Refer for screening diabetic eye exam (see order) HgbA1C today (see order)    Appended Document: Results of HGB A1C    Lab Visit  Laboratory Results   Blood Tests   Date/Time Received: April 13, 2009 12:19 PM  Date/Time Reported: Lenoria Farrier  April 13, 2009 12:19 PM   HGBA1C: 7.2%   (Normal Range: Non-Diabetic - 3-6%   Control Diabetic - 6-8%)    Orders Today:

## 2010-04-14 NOTE — Letter (Signed)
Summary: Letter of [facility]  [address]   [place], [place] [address]   Phone: [phone]  Fax: [phone]    05/04/2009      RE:  Travis Bryan  [id number]  [birth year]  To Whom It May Concern:  Travis Bryan is a patient of the [facility] and under my care.   [name] has congestive heart failure with last ejection fraction below 20% and with implanted AICD.  He has uncontrolled diabetes mellitus II, uncontroled hypertension, mild renal insufficiency, and elevated cholesterol.  He cannot return to his former job or engage in any strenuous physical activity which will make substantial, gainful employment very doubtful for him. Therefore, without income he is unable to meet his child support obligations.  We support [name] in his efforts to obtain [employer] and are hopeful that they will approve him for this program and associated benefits.   Sincerely,    Barton Dubois, MD Physician  Appended Document: Letter of Support/Disability Phone number said patient not available.  Mailed letter to patient today.

## 2010-04-14 NOTE — Assessment & Plan Note (Signed)
Summary: device ck/mt   Visit Type:  Follow-up Primary Zira Helinski:  Barton Dubois MD   History of Present Illness: Travis Bryan returns today for follow-up.  He is a very pleasant young male with a nonischemic cardiomyopathy and congestive heart failure, hypertension and obesity.  He returns today for follow-up.  He has been in jail. No recent hospitalizations for CHF.   Current Medications (verified): 1)  Furosemide 80 Mg Tabs (Furosemide) .... Take 1 Tablet By Mouth Twice A Day 2)  Lisinopril 40 Mg Tabs (Lisinopril) .... Take 1 Tablet By Mouth Once A Day 3)  Carvedilol 25 Mg  Tabs (Carvedilol) .... Take 1 Tablet By Mouth Two Times A Day 4)  Aspirin 325 Mg Tabs (Aspirin) .... Take 1 Tablet By Mouth Once A Day 5)  Metformin Hcl 1000 Mg Tabs (Metformin Hcl) .... Take 1 Tab By Mouth Two Times A Day 6)  Insulin Syringe 31g X 5/16" 0.5 Ml Misc (Insulin Syringe-Needle U-100) .... Use To Inject Insulin Before Breakfast and Dinner 7)  Relion 70/30 70-30 % Susp (Insulin Isophane & Regular) .... Inject 40 Units Under Skin 30 Minutes Before Breakfast and Inject 25 Units Under Skin 30 Minutes Before Evening Meal 8)  Clonidine Hcl 0.2 Mg Tabs (Clonidine Hcl) .... Take 1 Tablet By Mouth Two Times A Day 9)  Amlodipine Besylate 10 Mg Tabs (Amlodipine Besylate) .... Take 1 Tablet By Mouth Once A Day  Allergies (verified): No Known Drug Allergies  Past History:  Past Medical History: Last updated: [id number] SYSTOLIC HEART FAILURE, CHRONIC (ICD-428.22) CARDIOMYOPATHY, SECONDARY (ICD-425.9) ICD - IN SITU (ICD-V45.02) DYSLIPIDEMIA (ICD-272.4) HYPERTENSION (ICD-401.9) OBESITY (ICD-278.00) DIABETES MELLITUS, TYPE II (ICD-250.00) INADEQUATE MATERIAL RESOURCES (ICD-V60.2)    Past Surgical History: Last updated: 07/03/2008 ICD - [name]. Lovena Le, MD - 04/24/07 - Atlas II VR from [facility] for nonischemic cardiomyopathy and   class II heart failure  Review of Systems  The patient denies chest pain,  syncope, dyspnea on exertion, and peripheral edema.    Vital Signs:  Patient profile:   40 year old male Height:      71 inches Weight:      245 pounds BMI:     34.29 Pulse rate:   69 / minute BP sitting:   181 / 122  Vitals Entered By: Margaretmary Bayley CMA (February 15, 2010 12:06 PM)  Physical Exam  General:  alert, well-developed, well-nourished, and well-hydrated.   Head:  normocephalic and atraumatic.   Eyes:  pupils equal, pupils round, and pupils reactive to light.   Mouth:  Oral mucosa and oropharynx without lesions or exudates.  Poor dentition Neck:  No bruitsor goiter. Chest Wall:  AICD in place. Lungs:  Normal respiratory effort, chest expands symmetrically. Lungs are clear to auscultation, no crackles or wheezes. Heart:  Normal rate and regular rhythm. S1 and S2 normal without gallop, murmur, click, rub or other extra sounds. Abdomen:  Bowel sounds positive,abdomen soft and non-tender without masses, organomegaly or hernias noted. Msk:  normal ROM.   Pulses:  R and L carotid,radial,femoral,dorsalis pedis and posterior tibial pulses are full and equal bilaterally Extremities:  No clubbing, cyanosis, edema, or deformity noted with normal full range of motion of all joints.   Neurologic:  Cranial nerves intact. Plantar reflexes are down-going bilaterally. DTRs are symmetrical throughout. Sensory, motor and coordinative functions appear intact. Normal Gait.    ICD Specifications Following MD:  Cristopher Peru, MD     ICD Vendor:  [facility]  ICD Model Number:  [id number]     ICD Serial Number:  [id number] ICD DOI:  04/24/2007     ICD Implanting MD:  Cristopher Peru, MD  Lead 1:    Location: RV     DOI: 04/24/2007     Model #: [id number]     Serial #: [id number]     Status: active  Indications::  NICM   ICD Follow Up Battery Voltage:  2.84 V     Charge Time:  11.1 seconds     Underlying rhythm:  SR ICD Dependent:  No       ICD Device Measurements Right Ventricle:  Amplitude: 12.0 mV,  Impedance: 375 ohms, Threshold: 0.25 V at 0.5 msec Shock Impedance: 44 ohms   Episodes MS Episodes:  0     Coumadin:  No Shock:  0     ATP:  0     Nonsustained:  0     Atrial Therapies:  0 Ventricular Pacing:  <1%  Brady Parameters Mode VVI     Lower Rate Limit:  40      Tachy Zones VF:  200     VT:  140 (MONITOR)     Next Cardiology Appt Due:  05/12/2010 Tech Comments:  NORMAL DEVICE FUNCTION.  NO EPISODES SINCE LAST CHECK.  NO CHANGES MADE. ROV IN 3 MTHS W/DEVICE CLINIC. Shelly Bombard  February 15, 2010 12:10 PM MD Comments:  Agree with above.  Impression & Recommendations:  Problem # 1:  ICD - IN SITU (ICD-V45.02) His device is working normally.  Will recheck in several months.  Problem # 2:  SYSTOLIC HEART FAILURE, CHRONIC (ICD-428.22) His symptoms remain class 2.  will follow. His updated medication list for this problem includes:    Furosemide 80 Mg Tabs (Furosemide) .Marland Kitchen... Take 1 tablet by mouth twice a day    Lisinopril 40 Mg Tabs (Lisinopril) .Marland Kitchen... Take 1 tablet by mouth once a day    Carvedilol 25 Mg Tabs (Carvedilol) .Marland Kitchen... Take 1 tablet by mouth two times a day    Aspirin 325 Mg Tabs (Aspirin) .Marland Kitchen... Take 1 tablet by mouth once a day    Amlodipine Besylate 10 Mg Tabs (Amlodipine besylate) .Marland Kitchen... Take 1 tablet by mouth once a day  Problem # 3:  HYPERTENSION (ICD-401.9) His blood pressure is not well controlled. He admits to dietary non-compliance. His updated medication list for this problem includes:    Furosemide 80 Mg Tabs (Furosemide) .Marland Kitchen... Take 1 tablet by mouth twice a day    Lisinopril 40 Mg Tabs (Lisinopril) .Marland Kitchen... Take 1 tablet by mouth once a day    Carvedilol 25 Mg Tabs (Carvedilol) .Marland Kitchen... Take 1 tablet by mouth two times a day    Aspirin 325 Mg Tabs (Aspirin) .Marland Kitchen... Take 1 tablet by mouth once a day    Clonidine Hcl 0.2 Mg Tabs (Clonidine hcl) .Marland Kitchen... Take 1 tablet by mouth two times a day    Amlodipine Besylate 10 Mg Tabs (Amlodipine besylate) .Marland Kitchen... Take 1  tablet by mouth once a day  Patient Instructions: 1)  Your physician wants you to follow-up in: 3 months with the device clinic and 12 months with Dr Travis Bryan will receive a reminder letter in the mail two months in advance. If you don't receive a letter, please call our office to schedule the follow-up appointment.

## 2010-04-14 NOTE — Letter (Signed)
Summary: Appointment - Missed  [facility], [place]  [address] [place]   [place], [place] [address]   Phone: [phone]  Fax: [phone]     Jul 21, 2009 MRN: [id number]   [facility] [address], [place]  [address]   Dear Mr. Delker,  Our records indicate you missed your appointment on 4/26 with Dr Lovena Le. It is very important that we reach you to reschedule this appointment. We look forward to participating in your health care needs. Please contact us at the number listed above at your earliest convenience to reschedule this appointment.     Sincerely,   Luanna Cole Scheduling Team

## 2010-04-14 NOTE — Cardiovascular Report (Signed)
Summary: Office Visit   Office Visit   Imported By: Sallee Provencal 03/25/2009 12:40:06  _____________________________________________________________________  External Attachment:    Type:   Image     Comment:   External Document

## 2010-04-14 NOTE — Progress Notes (Signed)
Summary: Pt request call  Phone Note Call from Patient Call back at Home Phone [phone]   Caller: Patient Reason for Call: Talk to Nurse Initial call taken by: Delsa Sale,  February 18, 2010 3:31 PM  Follow-up for Phone Call        pt called and gave me his medication list and I will let Dr Lovena Le review next week.  I called in refills for him Janan Halter, RN, BSN  February 18, 2010 6:00 PM    Prescriptions: AMLODIPINE BESYLATE 10 MG TABS (AMLODIPINE BESYLATE) Take 1 tablet by mouth once a day  #30 x 3   Entered by:   Janan Halter, RN, BSN   Authorized by:   Sophronia Simas, MD, [facility]   Signed by:   Janan Halter, RN, BSN on 02/18/2010   Method used:   Electronically to        [facility] (retail)       [address]       [place], [place]  [address]       Ph: [phone]       Fax: [phone]   RxID:   [id number] CLONIDINE HCL 0.2 MG TABS (CLONIDINE HCL) Take 1 tablet by mouth two times a day  #60 x 3   Entered by:   Janan Halter, RN, BSN   Authorized by:   Sophronia Simas, MD, [facility]   Signed by:   Janan Halter, RN, BSN on 02/18/2010   Method used:   Electronically to        [facility] (retail)       [address]       [place], [place]  [address]       Ph: [phone]       Fax: [phone]   [id number] CARVEDILOL 25 MG  TABS (CARVEDILOL) Take 1 tablet by mouth two times a day  #60 x 6   Entered by:   Janan Halter, RN, BSN   Authorized by:   Sophronia Simas, MD, [facility]   Signed by:   Janan Halter, RN, BSN on 02/18/2010   Method used:   Electronically to        [facility] (retail)       [address]       [place], [place]  [address]       Ph: [phone]       Fax: [phone]   [id number] LISINOPRIL 40 MG TABS (LISINOPRIL) Take 1 tablet by mouth once a day  #30 x 6   Entered by:   Janan Halter, RN, BSN  Authorized by:   Sophronia Simas, MD, [facility]   Signed by:   Janan Halter, RN, BSN on 02/18/2010   Method used:   Electronically to        [facility] (retail)       [address]       [place], [place]  [address]       Ph: [phone]       Fax: [phone]   RxID:   [id number] FUROSEMIDE 80 MG TABS (FUROSEMIDE) Take 1 tablet by mouth twice a day  #60 x 6   Entered by:   Claiborne Billings  Madilyn Fireman, RN, BSN   Authorized by:   Sophronia Simas, MD, [facility]   Signed by:   Janan Halter, RN, BSN on 02/18/2010   Method used:   Electronically to        [facility] (retail)       [address]       [place],   [address]       Ph: [phone]       Fax: [phone]   RxID:   [id number]

## 2010-04-14 NOTE — Letter (Signed)
Summary: Device-Delinquent Check  [place] [place] [address] [place]   [place], [place] [address]   Phone: [phone]  Fax: [phone]     December 10, 2009 MRN: [id number]   [facility] [place] [address] [place], [place]  [address]   Dear Mr. Stonesifer,  According to our records, you have not had your implanted device checked in the recommended period of time.  We are unable to determine appropriate device function without checking your device on a regular basis.  Please call our office to schedule an appointment with Dr Lovena Le, as soon as possible.  If you are having your device checked by another physician, please call us so that we may update our records.  Thank you,  Gasper Sells, EMT  December 10, 2009 2:50 PM  [facility] certified

## 2010-04-14 NOTE — Progress Notes (Signed)
Summary: phone/gg  Phone Note Call from Patient   Caller: Patient Summary of Call: Pt request a letter  that stat how long it will be before he can return to work.  He needs this for child support as a court date is coming up. Dr Dyann Kief said he has written this in the past and nothing has changed. Can you call the pt and see if this letter needs to be re-written? Pt # is [id number] Initial call taken by: Gevena Cotton RN,  April 27, 2009 3:09 PM  Follow-up for Phone Call        letter has been written by Butch Penny T Follow-up by: Gevena Cotton RN,  May 04, 2009 1:56 PM

## 2010-04-14 NOTE — Cardiovascular Report (Signed)
Summary: Certified Letter Signed - Patient (Appt. Made)  Certified Letter Signed - Patient (Appt. Made)   Imported By: Ranell [name] 01/18/2010 13:08:45  _____________________________________________________________________  External Attachment:    Type:   Image     Comment:   External Document

## 2010-06-05 ENCOUNTER — Encounter: Payer: Self-pay | Admitting: Internal Medicine

## 2010-06-14 LAB — GLUCOSE, CAPILLARY: Glucose-Capillary: 176 mg/dL — ABNORMAL HIGH (ref 70–99)

## 2010-06-23 LAB — GLUCOSE, CAPILLARY: Glucose-Capillary: 573 mg/dL (ref 70–99)

## 2010-06-27 LAB — GLUCOSE, CAPILLARY

## 2010-06-28 LAB — GLUCOSE, CAPILLARY: Glucose-Capillary: 368 mg/dL — ABNORMAL HIGH (ref 70–99)

## 2010-07-26 NOTE — Discharge Summary (Signed)
Travis Bryan, Travis Bryan NO.:  [id number]   MEDICAL RECORD NO.:  [id number]          PATIENT TYPE:  OBV   LOCATION:  2007                         FACILITY:  [facility]   PHYSICIAN:  [name]. Lovena Le, MD    DATE OF BIRTH:  08-06-1970   DATE OF ADMISSION:  04/24/2007  DATE OF DISCHARGE:  04/25/2007                               DISCHARGE SUMMARY   ALLERGIES:  NO KNOWN DRUG ALLERGIES.   Dictation, exam and workup greater than 40 minutes.   FINAL DISCHARGE DIAGNOSES:  1. Status post implant of a Estate agent II VR cardioverter      defibrillator.  2. Nonischemic cardiomyopathy.      a.     Echocardiogram April 2007:  Ejection fraction 50%.      b.     Echocardiogram October 2008:  Great decline, ejection       fraction 10-15%.  3. Persistent hypertension this admission despite medical therapy.   PLAN FOR BLOOD PRESSURE CONTROL:  Stop metoprolol and add Coreg since  the patient exhibits a marked cardiomyopathy.  We expect Coreg to be up-  titrated as tolerated from 12.5 mg twice daily to 25 mg twice daily and  eventually 50 mg twice daily.  This is for Dr. Jerilee Hoh.   SECONDARY DIAGNOSES:  1. Left heart catheterization June 2006:  Nonobstructive coronary      artery disease.  Ejection fraction 15%.  2. New York Heart Association class II chronic systolic congestive      heart failure.  3. Hypertension with admission hypertensive emergency October 2008.  4. Diabetes.  5. Obesity.  6. Admission October 2008 with acute on chronic renal failure.  7. Dyslipidemia.   PROCEDURE:  April 24, 2007:  Implant of St. Jude Atlas II VR  cardioverter defibrillator, Dr. Cristopher Peru.  Defibrillator threshold  70, less than or equal to 20 joules.   HISTORY:  Travis Bryan is a 40 year old male.  He has a history of  nonischemic cardiomyopathy.  His ejection fraction runs 10-15%.  He has  a remote of substance abuse, but none in over a year.  He has diabetes  and hypertension.   In fact, he has been admitted in October 2008 with  hypertensive emergency.   The patient is stable from a cardiac perspective, but has clear-cut  indication for ICD implantation.  The risks, benefits, and goals have  been described to the patient and his wife.  They wish to proceed.   HOSPITAL COURSE:  The patient presents electively April 24, 2007 for  cardioverter defibrillator implant.  His blood pressure is exceeding 99991111  systolic.  He has been treated in the EP lab with IV medication.  He has  been treated after the procedure with several different medications  including IV hydralazine and then re-institution of his home  medications.  Post-procedure day number 1, his blood pressure was in the  170s.  He was given Coreg 12.5 mg.  He was given lisinopril 20 mg.  He  was given clonidine 0.2 mg.  He was given hydralazine 25 mg.  He  was  given isosorbide dinitrate 20 mg.  His blood pressure normalized to [id number]  systolic.  This suggested that he go home with several of these  antihypertensives as listed below and talk with Dr. Jerilee Hoh about  escalating Coreg coverage.  This will help not only with his  cardiomyopathy, but also his blood pressure.   LABORATORY DATA:  White cells are 6, hemoglobin 14, hematocrit 42.2,  platelets 289, pro-time 11.5, INR 1, sodium 141, potassium 3.4, chloride  104, carbonate 32, glucose 123, BUN 11, creatinine 1.3.   FOLLOW UP:  1. This patient has a follow up at the ICD clinic with [facility] [address], number 1, May 08, 2007 at 9:20.  2. He will see Dr. Lovena Le Tuesday, Jul 30, 2007 at 3:50 for re-      interrogation of his device.   The patient has been given several prescriptions pertaining to blood  pressure control and also 1 for Coreg.  In addition, his incision is  healing well.  There is no hematoma.  His heart rate is a sinus rhythm  at discharge.      Sueanne Margarita, PA      [place] Lovena Le, MD   Electronically Signed    GM/MEDQ  D:  04/25/2007  T:  04/26/2007  Job:  [id number]   cc:   [name]. Lovena Le, MD  Domingo Mend, M.D.

## 2010-07-26 NOTE — Op Note (Signed)
Travis Bryan, BROKER NO.:  [id number]   MEDICAL RECORD NO.:  [id number]          PATIENT TYPE:  OBV   LOCATION:  2007                         FACILITY:  [facility]   PHYSICIAN:  [name]. Lovena Le, MD    DATE OF BIRTH:  1970/06/10   DATE OF PROCEDURE:  04/24/2007  DATE OF DISCHARGE:                               OPERATIVE REPORT   PROCEDURE PERFORMED:  ICD implantation with defibrillation threshold  testing.   INDICATIONS:  Dilated cardiomyopathy, EF 15%, class II heart failure.   INTRODUCTION:  The patient is a 40 year old male with longstanding  nonischemic cardiomyopathy, hypertension and congestive heart failure  who is admitted for ICD implantation.   PROCEDURE:  After informed consent was obtained, the patient was taken  to the diagnostic EP lab in the fasting state.  After the usual  preparation and draping, intravenous fentanyl and midazolam was given  for sedation.  Lidocaine 30 mL was infiltrated in the left  infraclavicular region.  A 7 cm incision was carried out over this  region.  Electrocautery was utilized to dissect down to the fascial  plane.  The left subclavian vein was subsequently punctured and the St.  Jude Durata, model 7121 65 cm active fixation defibrillation lead,  serial number [id number], was advanced into the right ventricle.  The lead  was placed on the RV septum where the R-waves measured 12 mV and the  pacing impedance was 594 ohms, threshold was 0.7 volts at 0.5  milliseconds and 10-volt pacing did not stimulate the diaphragm.  With  the ventricular lead in satisfactory position, it was secured to the  subpectoralis fascia with a figure-of-eight silk suture.  The sewing  sleeve was secured with silk suture.  Electrocautery was utilized to  make a subcutaneous pocket.  Kanamycin was utilized to irrigate the  pocket.  Electrocautery was utilized to assure hemostasis.  The Atlas II  VR from [place] was connected to the defibrillation  lead, serial number  [id number], and placed in the subcutaneous pocket.  Generator was secured  with silk suture.  Additional kanamycin was utilized to irrigate the  pocket and then defibrillation threshold testing was carried out.   After the patient more deeply sedated with fentanyl and Versed, VF was  induced with T-wave shock.  A 20-joule shock was delivered which  terminated VF and restored sinus rhythm.  At this point, no additional  defibrillation threshold testing was carried out and the incision closed  with a layer of 2-0 Vicryl, followed by a layer of 3-0 Vicryl.  Benzoin  was painted on the skin, Steri-Strips were applied and a pressure  dressing was placed.  The patient was returned to his room in  satisfactory condition.   COMPLICATIONS:  There were no immediate complications.   RESULTS:  This demonstrated successful implantation of a St. Jude single  chamber defibrillator in a patient with nonischemic cardiomyopathy and  class II heart failure, ejection fraction 15%.      [name]. Lovena Le, MD  Electronically Signed     GWT/MEDQ  D:  04/24/2007  T:  04/25/2007  Job:  [id number]   cc:   Caroleen Hamman, MD

## 2010-07-26 NOTE — Assessment & Plan Note (Signed)
[facility]                         ELECTROPHYSIOLOGY OFFICE NOTE   TORRE, CARULLI                        MRN:          [id number]  DATE:07/30/2007                            DOB:          12-17-1970    Travis Bryan returns today for follow-up.  He is a very pleasant young male  with a nonischemic cardiomyopathy and congestive heart failure,  hypertension and obesity.  He returns today for follow-up.  He also has  dyslipidemia.  He denies dietary indiscretion but has gained 9 pounds  since January.   His medications include:  1. Aspirin 325 a day.  2. Lasix 80 twice daily.  3. Glipizide 10 mg daily.  4. Metformin 1 g twice daily.  5. Carvedilol 12.5 mg twice daily.  6. Lisinopril 40 mg daily.   On physical exam, he is a pleasant young man in no acute distress.  He  is somewhat unkempt appearing.  The blood pressure was 180/130; on  recheck, it was 180/120.  The pulse 98 and regular, respirations were  18.  Weight was 259 pounds.  NECK:  Revealed 7-cm jugular venous distention.  LUNGS:  Clear bilaterally to auscultation.  No wheezes, rales or  rhonchi.  There is no increased work of breathing.  CARDIOVASCULAR EXAM:  Revealed a regular rate and rhythm.  Normal S1-S2.  There was a fairly prominent S4 gallop present.  The PMI was enlarged  and laterally displaced.  ABDOMINAL EXAM:  Was soft, nontender.  EXTREMITIES:  Demonstrated no peripheral edema.   Interrogation of his defibrillator demonstrates a Training and development officer V-168.  The R waves greater than 12.  The impedance 430.  The threshold 1.25  volts at 0.5 milliseconds.  Battery voltage was 3.2 volts.  Underlying  rhythm was sinus.  He was less than 1% V paced.   IMPRESSION:  1. Nonischemic cardiomyopathy.  2. Congestive heart failure class II.  3. Hypertension.  4. Status post ICD insertion.   DISCUSSION:  Overall, Mr. Seliga is stable.  His defibrillator is working  normally.  I have asked  that he decrease his dietary intake of calories  as he is gaining weight.  I have also asked that he maintain a low salt  diet; he is not.  We will plan to see him back in the office in several  months.     [name]. Lovena Le, MD  Electronically Signed    GWT/MedQ  DD: 07/30/2007  DT: 07/30/2007  Job #: [id number]

## 2010-07-26 NOTE — Assessment & Plan Note (Signed)
[facility]                         ELECTROPHYSIOLOGY OFFICE NOTE   EUGUNE, LOCKNER                        MRN:          [id number]  DATE:01/11/2007                            DOB:          [birth year]    [name] is referred today by Dr. Caroleen Hamman at the [facility]  outpatient clinic for consideration of prophylactic ICD insertion.  The  patient is a very pleasant 40 year old man with a longstanding  nonischemic cardiomyopathy initially diagnosed two years ago.  The  etiology of this cardiomyopathy is unclear.  He has a history of alcohol  abuse as well as a history of recreational drug use, although he denies  both in the last two years.  The patient has never had syncope.  He was  recently in the emergency department complaining of some abdominal  fullness and was found to have elevation of his BNP of over 1000.  He  was treated with diuresis and felt better.  Since his diagnosis of heart  failure, he has had little in the way of symptoms, although he does note  that when he exerts himself extremely, he gets short of breath and has  to stop what he is doing.  He has had no chest pain.  He denies  peripheral edema.   Additional past medical history is notable for hypertension for over 10  years and diabetes for several years.   FAMILY HISTORY:  Notable for mother and several aunts having dilated  cardiomyopathy.  One aunt died suddenly.   SOCIAL HISTORY:  Patient has a history of alcohol abuse but stopped  drinking two years ago.  He denies tobacco use.  He denies current  recreational drug use, although he did use it in the past.  He works in  [employer].   REVIEW OF SYSTEMS:  As noted in the HPI.  All systems were reviewed and  found to be negative, except as noted above.   PHYSICAL EXAMINATION:  He is a pleasant, obese young man in no acute  distress.  Blood pressure today was 163/110.  The pulse was 79 and regular.  Respirations were 18.  The weight was 238 pounds.  HEENT:  Normocephalic and atraumatic.  Pupils are equal and round.  Oropharynx is moist.  Sclerae are anicteric.  NECK:  There was 7 cm of jugular venous distention.  There was no  thyromegaly.  The trachea was midline.  The carotids were 2+ and  symmetric.  LUNGS:  Clear bilaterally to auscultation.  There are no wheezes, rales  or rhonchi present.  There is no increased work of breathing.  CARDIOVASCULAR:  Regular rate and rhythm with a normal S1 and S2.  There  is a common S4 gallop present.  The PMI was enlarged and laterally  displaced.  ABDOMEN:  Soft, nontender, nondistended.  There is no organomegaly.  The  bowel sounds are present.  There is no rebound or guarding.  EXTREMITIES:  No clubbing, cyanosis or edema.  The pulses were 2+ and  symmetric.  NEUROLOGIC:  Alert and oriented x3.  His cranial  nerves are intact.  Strength is 5/5 and symmetric.   EKG demonstrates sinus rhythm with LVH, left atrial enlargement, and  left anterior fascicular block.   MEDICATIONS:  1. Aspirin 325 a day.  2. Lasix 80 twice daily.  3. Glipizide 10 daily.  4. Imdur 60 daily.  5. Lisinopril 20 a day.  6. Metformin 1 gm twice daily.  7. Atenolol 100 mg daily.  8. Niacin 1 gm daily.  9. Potassium supplement.   IMPRESSION:  1. Nonischemic cardiomyopathy.  2. Congestive heart failure, class II.  3. Diabetes.  4. Obesity.  5. Hypertension.   DISCUSSION:  I have discussed treatment options with the patient in  detail.  He has an indication for prophylactic ICD insertion.  He is  considering his options and will call if he would like to proceed with  ICD implant.     [name]. Lovena Le, MD  Electronically Signed    GWT/MedQ  DD: 01/11/2007  DT: 01/12/2007  Job #: [id number]   cc:   Caroleen Hamman, MD

## 2010-07-26 NOTE — Discharge Summary (Signed)
Travis Bryan, Travis Bryan               ACCOUNT NO.:  [id number]   MEDICAL RECORD NO.:  [id number]          PATIENT TYPE:  INP   LOCATION:  [id number]                         FACILITY:  [facility]   PHYSICIAN:  Thomes Lolling, M.D.    DATE OF BIRTH:  February 05, 1971   DATE OF ADMISSION:  12/18/2006  DATE OF DISCHARGE:  12/20/2006                               DISCHARGE SUMMARY   DISCHARGE DIAGNOSES:  1. CHF exacerbation with ejection fraction of 10%-15% by      echocardiogram performed on December 18, 2006.  2. Hypertensive emergency.  3. Diabetes mellitus type 2 poorly controlled with last hemoglobin A1c      of 6.8.  4. Dyslipidemia.  5. Obesity.  6. History of alcohol abuse.  7. Acute on chronic renal failure.  8. Hypokalemia.   DISCHARGE MEDICATIONS:  1. Hydralazine 100 mg p.o. twice daily.  2. Aspirin 325 mg p.o. daily.  3. Lasix 80 mg p.o. twice daily.  4. Imdur 60 mg p.o. daily.  5. Lisinopril 20 mg p.o. once daily.  6. Metformin 1000 mg p.o. twice daily.  7. Metaxalone 400 mg p.o. daily.  8. K-Dur 40 mEq p.o. twice daily.  9. Niacin 100 mg p.o. daily.   HOSPITAL FOLLOWUP:  The patient will follow up with Dr. Lovena Le of  Cardiology on January 11, 2007, at 11 a.m.  The patient will need to be  worked for possibility of getting AICD given very low ejection fraction  and history of dilated cardiomyopathy.  He will also follow with Dr.  Jerilee Hoh, his primary care doctor, and he will call on Monday to  schedule that appointment.   PROCEDURES PERFORMED:  1. Acute abdominal series performed on December 18, 2006, showing      pulmonary congestion and cardiomegaly.  2. Two-view echocardiogram performed on December 18, 2006, ejection      fraction of 10%-15%.   BRIEF ADMITTING HISTORY AND PHYSICAL:  This is a 40 year old with the  history of nonischemic cardiomyopathy with EF of 50% by 2-D echo  performed in 2007.  He has a history of alcohol abuse, hypertension,  diabetes and  hypercholesterolemia.  He comes in with increasing  orthopnea and dyspnea on exertion for the last few weeks.  He also  describessymptoms suggestive of PND as he has occassionally had with  waking episodes with gasping.  He had previously stopped taking his  prescribed Imdur and metoprolol over the last month.  He also is  reporting increased abdominal girth and increasing peripheral edema over  that time.   ADMITTING PHYSICAL EXAMINATION:  VITAL SIGNS:  Temperature 97.8, blood  pressure 178/136, pulse 120, respirations 18, and satting 97% on room  air.  GENERAL:  This is a mildly obese patient, in no acute distress or any  distraction.  HEENT:  Pupils are equally round and reactive.  Extraocular eye  movements intact.  Oropharynx is clear of any erythema, exudates, or  swelling.  NECK:  Supple.  No chronic bruits or JVD.  RESPIRATORY:  Air crackles at the bases bilaterally.  The patient has  good air movement.  CARDIOVASCULAR:  Systolic ejection murmur on a sternomastoid border 2/6;  otherwise regular rhythm and rate.  GI:  Abdomen is distended and nontender.  Tympanic to percussion with no  fluid wave appreciated.  EXTREMITIES:  Negative for edema or cyanosis.  They are warm and dry.  SKIN:  No rashes seen on exam.  NEUROLOGIC:  Alert and oriented x3.  Cranial nerves II through XII  intact.  Muscle strength is +5 in all extremities.  Sensation is intact  to light touch in lower extremities.   ADMITTING LABORATORIES:  Sodium 140, potassium 3.1, chloride 105,  bicarbonate 27, BUN 12, creatinine 1.7, and glucose 142.  Hemoglobin  12.6, white count 6.7, and AST 4.7.  UA negative.  Quantitative cardiac  markers; myoglobin 114, CK-MB 1.4, troponin 0.11, BNP 1596, anion gap 8,  bilirubin 2.2, alkaline phosphatase 56, AST 22, ALT 18, protein 7.0,  albumin 3.4, and calcium 9.0.   HOSPITAL COURSE BY PROBLEM:  1. CHF exacerbation.  His CHF exacerbation was secondary to      uncontrolled  hypertension. He had abruptly stopped his metoprolol      recently. His SBP was was 178/136 upon admission.  We restarted his      prescribed medicines.  We gave him 80 mg IV of Lasix twice daily.      The patient's blood pressure responded nicely.  We were able to      diurese the patient well within the next 24 hours.  He was negative      2.3 liters in the 24 hours and clinically the patient's lungs were      clear to auscultation.  On day #2 of admission, he was complaining      of less shortness of breath and orthopnea as well.  An      echocardiogram was obtained that showed an EF 10%-15%.  The patient      will need to see a cardiologist and we made an appointment with Dr.      Lovena Le of Cardiology for the possibility of placement of the ICD on      an outpatient basis.  We discharged the patient on p.o. Lasix with      potassium supplementation in addition to hydralazine, Imdur, and      atenolol.  2. Hypertensive emergency.  The patient was admitted with hypertensive      emergency, blood pressure above 170, and shortness of breath.  We      lowered the patient's blood pressure originally with hydralazine,      Lasix, and Imdur.  The patient did quite well over the next 24-48      hours and his blood pressure eventually dropped to systolic between      [id number].  He previously had not been taking his medications, so it      is likely the cause of his hypertensive emergency.  He will need      close follow up on outpatient basis. We counselled the patient in      regards to the importance of taking his antihypertensives.  3. Acute on chronic renal failure.  The patient has baseline      creatinine of 1.2 to 1.4.  He was admitted with the creatinine of      1.7.  His elevated creatinine was likely due to poor cardiac      output.  Once we optimized his heart function, his creatinine      eventually trended down  to 1.2. We additional lasix though his      creatinine trended back up  to 1.6.  Given that he was switched over      to p.o. Lasix.  He will need a BMP at his next primary care visit      to monitor his creatinine.  4. Hypokalemia.  The patient admitted to the hospital hypokalemic      several times. This is likely secondary to his Lasix use.  We      discharged the patient on 80 mg p.o. b.i.d. of Lasix in addition to      potassium supplements.  His electrolytes will be checked at his      primary care visit.  5. Hyperbilirubinemia.  The patient had a bilirubin of 2.6 on      admission with direct 2.2, indirect 0.4.  His haptoglobin and LDH      were negative.  Remainder of his LFTs were all normal.  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   DISCHARGE LABORATORIES:  Sodium 140, potassium 3.6, chloride 103, and  bicarb 29.  White count 6.2, hemoglobin 12.1, platelets 280,000.   DISCHARGE VITALS:  Temperature 98.3, blood pressure 154/117, pulse 108,  respirations 20, and satting 95%-97% on room air.      Caroleen Hamman, MD  Electronically Signed      Thomes Lolling, M.D.  Electronically Signed    CB/MEDQ  D:  01/23/2007  T:  01/24/2007  Job:  [id number]   cc:   Domingo Mend, M.D.  [name]. Lovena Le, MD

## 2010-07-26 NOTE — Discharge Summary (Signed)
NAMESHYHEIM, ZAZUETA NO.:  [id number]   MEDICAL RECORD NO.:  [id number]          PATIENT TYPE:  OBV   LOCATION:  2007                         FACILITY:  [facility]   PHYSICIAN:  Sueanne Margarita, PA   DATE OF BIRTH:  [birth year]   DATE OF ADMISSION:  04/24/2007  DATE OF DISCHARGE:  04/25/2007                               DISCHARGE SUMMARY   ADDENDUM:  This concerns exit interview with regard to describing  medication therapy.  As dictated in the discharge summary, the patient  did require quite some variety of antihypertensives in order to get his  blood pressure from the 170s to 190s into the 130s.  When describing his  discharge medications, the patient lying with his head at the foot of  the bed, half listing, not particularly interested in discussion of  hypertension.  However, we did have to go over some medications.  I am  afraid I probably have given him too many medications for him to  comprehend or except.  The one important one is for him to stop taking  metoprolol and to start with Coreg, especially in view of his  cardiomyopathy.  He will continue on lisinopril.  I had thought that  maybe the generic form of BiDil would be helpful in this African  American.  When I got to that point in the medications, the patient  became very resistant and somewhat aggressive.  Actually, perhaps I  insulted him by giving him too many medications and potentially costing  him too much money.  So, I emphasized that he needs Coreg, lisinopril,  and he would follow up with Dr. Jerilee Hoh.      Sueanne Margarita, PA     GM/MEDQ  D:  04/25/2007  T:  04/26/2007  Job:  [id number]   cc:   [name]. Lovena Le, MD  Domingo Mend, M.D.

## 2010-07-26 NOTE — Assessment & Plan Note (Signed)
[facility]                         ELECTROPHYSIOLOGY OFFICE NOTE   ALEXXANDER, BRINDLE                        MRN:          [id number]  DATE:04/08/2007                            DOB:          10/11/1970    Mr. Fiser returns today for followup.  He is a pleasant young male with  a history of nonischemic cardiomyopathy and congestive heart failure, EF  10-15%.  He has a remote history of substance abuse but none for over a  year.  He also has diabetes with hemoglobin A1c in the six to seven  range.  He has hypertension as well.  The patient returns today for  follow-up.  Medications include aspirin 325, Lasix 80 b.i.d., glipizide  10 a day, lisinopril 20 a day, metformin 1 gram b.i.d., metoprolol, dose  unknown, and Zaroxolyn.   On exam he is a pleasant well-appearing young man in no distress.  Blood  pressure was 190/130, the pulse was 63 and regular, respirations were  18.  Weight was 250 pounds.  NECK:  Revealed no jugular distention.  LUNGS:  Clear bilateral to auscultation.  No wheezes, rales or rhonchi  are present.  There is no increased work of breathing.  CARDIOVASCULAR EXAM:  Regular rate and rhythm with normal S1-S2.  There  is a soft S4 gallop present.  EXTREMITIES:  Demonstrate no edema.   IMPRESSION:  1. Nonischemic cardiomyopathy, EF of 10-15%.  2. Congestive heart failure class II.  3. History of dyslipidemia.   DISCUSSION:  Overall Mr. Ciccone is stable but has clear-cut indication  for prophylactic ICD implantation.  The risks, benefits, goals and  expectation the procedure has been discussed with  the patient and he we  will try to schedule his implant in the next several weeks.     [name]. Lovena Le, MD  Electronically Signed    GWT/MedQ  DD: 04/08/2007  DT: 04/08/2007  Job #: [id number]   cc:   Thomes Lolling, M.D.

## 2010-07-28 ENCOUNTER — Encounter: Payer: Self-pay | Admitting: Internal Medicine

## 2010-07-28 ENCOUNTER — Ambulatory Visit (INDEPENDENT_AMBULATORY_CARE_PROVIDER_SITE_OTHER): Payer: Self-pay | Admitting: Internal Medicine

## 2010-07-28 VITALS — BP 189/144 | HR 116 | Temp 98.3°F | Ht 70.0 in | Wt 223.8 lb

## 2010-07-28 DIAGNOSIS — E119 Type 2 diabetes mellitus without complications: Secondary | ICD-10-CM

## 2010-07-28 DIAGNOSIS — I10 Essential (primary) hypertension: Secondary | ICD-10-CM

## 2010-07-28 LAB — GLUCOSE, CAPILLARY: Glucose-Capillary: 560 mg/dL (ref 70–99)

## 2010-07-28 LAB — POCT GLYCOSYLATED HEMOGLOBIN (HGB A1C): Hemoglobin A1C: >14

## 2010-07-28 MED ORDER — FUROSEMIDE 40 MG PO TABS: 40.0000 mg | ORAL_TABLET | Freq: Two times a day (BID) | ORAL | Status: DC

## 2010-07-28 MED ORDER — INSULIN NPH ISOPHANE & REGULAR (70-30) 100 UNIT/ML ~~LOC~~ SUSP: 15.0000 [IU] | Freq: Every day | SUBCUTANEOUS | Status: DC

## 2010-07-28 MED ORDER — ASPIRIN 325 MG PO TABS: 325.0000 mg | ORAL_TABLET | Freq: Every day | ORAL | Status: DC

## 2010-07-28 MED ORDER — LISINOPRIL 40 MG PO TABS: 40.0000 mg | ORAL_TABLET | Freq: Every day | ORAL | Status: DC

## 2010-07-28 MED ORDER — CARVEDILOL 25 MG PO TABS: 25.0000 mg | ORAL_TABLET | Freq: Two times a day (BID) | ORAL | Status: DC

## 2010-07-28 MED ORDER — AMLODIPINE BESYLATE 10 MG PO TABS: 10.0000 mg | ORAL_TABLET | Freq: Every day | ORAL | Status: DC

## 2010-07-28 MED ORDER — INSULIN NPH ISOPHANE & REGULAR (70-30) 100 UNIT/ML ~~LOC~~ SUSP: 40.0000 [IU] | Freq: Every day | SUBCUTANEOUS | Status: DC

## 2010-07-28 MED ORDER — METFORMIN HCL 1000 MG PO TABS: 1000.0000 mg | ORAL_TABLET | Freq: Two times a day (BID) | ORAL | Status: DC

## 2010-07-28 NOTE — Progress Notes (Signed)
  Subjective:    Patient ID: Travis Bryan, male    DOB: 11/03/1970, 40 y.o.   MRN: [id number]  HPI Travis Bryan is a pleasant 40 year old man with a history of DM 2, uncontrolled hypertension, systolic heart failure who comes to the clinic after her about more than a year, for medication refills. He was in prison  last year for about 6 months and got out in October and had about a month supply of medications from the prison. Once the medications got over he didn't take any since December until now and was not seeing a doctor. Hi HbA1c today is more than 14 and his CBG is 570 and his blood pressure 189/444. He is asymptomatic and denies any chest pain, shortness of breath, back pain, abdominal pain, vision changes, headache.     Review of Systems     any fever, chills, recent weight loss, nausea, vomiting.  Objective:   Physical Exam    Constitutional: Vital signs reviewed.  Patient is a well-developed and well-nourished in no acute distress and cooperative with exam. Alert and oriented x3.  Head: Normocephalic and atraumatic Mouth: no erythema or exudates, MMM Eyes: PERRL, EOMI, conjunctivae normal, No scleral icterus.  Neck: Supple, Trachea midline normal ROM, No JVD, mass, thyromegaly, or carotid bruit present.  Cardiovascular: Tachycardic, S1 normal, S2 normal, no MRG, pulses symmetric and intact bilaterally Pulmonary/Chest: CTAB, no wheezes, rales, or rhonchi Abdominal: Soft. Non-tender, non-distended, bowel sounds are normal, no masses, organomegaly, or guarding present.  Musculoskeletal: No joint deformities, erythema, or stiffness, ROM full and no nontender Neurological: A&O x3, Strenght is normal and symmetric bilaterally, cranial nerve II-XII are grossly intact, no focal motor deficit, sensory intact to light touch bilaterally.  Skin: Warm, dry and intact. No rash, cyanosis, or clubbing.       Assessment & Plan:

## 2010-07-28 NOTE — Patient Instructions (Signed)
Please make a followup appointment in 10-12 days with Dr. Posey Pronto. Please fill up all the prescription today and started the medication as instructed, as it is very important for you to start back the medications as your sugars are very high and your blood pressure was very high.

## 2010-07-28 NOTE — Assessment & Plan Note (Signed)
No medications taken for about 5 months now. CBG 570 and HbA1c> 14. We'll start him back on metformin thousand milligram twice a day which he was on before and will give her NovoLog 70/30 1 while supply from the clinic and dose him at 15 units before breakfast and 10 units before dinner. We'll see him back in 10 days and reevaluate.

## 2010-07-28 NOTE — Assessment & Plan Note (Addendum)
Blood pressure severely uncontrolled-189/144. No blood pus medication taken for about 5 months. We'll check a BMP today. We'll refill lisinopril, Norvasc, carvedilol, Lasix and see him back in 10 days and reevaluate.

## 2010-07-29 LAB — BASIC METABOLIC PANEL WITH GFR
BUN: 12 mg/dL (ref 6–23)
Chloride: 101 mEq/L (ref 96–112)
Creat: 1.42 mg/dL (ref 0.40–1.50)
GFR, Est Non African American: 56 mL/min — ABNORMAL LOW (ref >60)
Glucose, Bld: 479 mg/dL — ABNORMAL HIGH (ref 70–99)

## 2010-07-29 NOTE — Consult Note (Signed)
Travis Bryan, BISAILLON NO.:  [id number]   MEDICAL RECORD NO.:  [id number]                   PATIENT TYPE:  INP   LOCATION:  [id number]                                 FACILITY:  [facility]   PHYSICIAN:  Thayer Headings, M.D.              DATE OF BIRTH:  12-Oct-1970   DATE OF CONSULTATION:  09/15/2003  DATE OF DISCHARGE:                                   CONSULTATION   [name] is a 40 year old black gentleman with a history of hypertension.  He was admitted to the hospital with several days of shortness of breath and  chest tightness.   The patient has a history of hypertension.  He has been told by several  nurses at the [facility] that he has had high blood pressure.  He has never really sought medical therapy.  He has not been on any  particular diet and does not get any regular exercise.  He does not have an  primary medical doctor and really does not go to the doctor all that often.   He presents to the [facility] Emergency Room after a week of progressive  dyspnea.  Over the past day or so the dyspnea has become more and more  severe prompting his visit.  Here in the ER he was found to have an elevated  BNP as well as a chest x-ray that was consistent with congestive heart  failure.  He is admitted for further evaluation and management.   He denies any episodes of angina, syncope, or presyncope.  He denies any  orthopnea or PND.   CURRENT MEDICATIONS:  None.   ALLERGIES:  NONE.   PAST MEDICAL HISTORY:  Hypertension.   SOCIAL HISTORY:  The patient smokes several cigars a day.   FAMILY HISTORY:  His mother had a history of diabetes mellitus as well as  coronary artery disease and coronary artery bypass grafting.   REVIEW OF SYSTEMS:  Significant for dyspnea for the past several weeks.  He  denies any other problems.   PHYSICAL EXAMINATION:  GENERAL:  He is a young black gentleman.  He is  currently in no acute distress.  VITAL  SIGNS:  Blood pressure 149/104 with a heart rate of 100, his  temperature is 99 degrees.  NECK:  Exam reveals 2+ carotids, he has no bruits.  There is no JVD, no  thyromegaly.  LUNGS:  Exam reveals a few basilar rales.  HEART:  Regular rate, S1, S2.  He has no S3 gallop.  ABDOMEN:  Exam reveals good bowel sounds and is nontender.  EXTREMITIES:  He has no edema.  NEUROLOGIC:  Nonfocal.   LABORATORY DATA:  Sodium 140, potassium 2.8, chloride 108, C02 24, BUN 12,  creatinine 1.5, glucose 126.  His BNP is 964, white blood cell count 12.8,  hemoglobin 13.2 with a hematocrit of 38.  His CK-MB is 1.2 with  a troponin  of 0.05.  His chest x-ray reveals cardiomegaly.  He has pulmonary edema and  he has fluid in his fissures bilaterally.   IMPRESSION AND PLAN:  1. Hypertension with mild congestive heart failure -- the patient presents     with moderate to severe hypertension with mild congestive heart failure.     He actually appears to be fairly comfortable after receiving some     intravenous Lasix.  In addition his potassium is very low.  I would like     to start him on an ACE inhibitor plus hydrochlorothiazide.  We also may     consider hydralazine and Imdur combination.  I agree with the Lasix for     now.  I suspect that he will not need many days of intravenous Lasix.     His congestive heart failure is most likely related to his hypertension     and he should not have recurrent congestive heart failure if his blood     pressure is well-controlled.  I suspect that he will be able to go home     soon.  I have given him a card and he is to call my office for an     appointment.  2. Hypokalemia -- the patient's potassium was quite low today.  We will     increase his potassium supplementation and he will need a followup BNP     tomorrow morning.  He will need to follow up with his medical doctor.  I     have advised him to get out and exercise on a daily basis.  I have     encouraged him  to stop smoking.  I have also asked him to eat a low salt     diet.                                               Thayer Headings, M.D.    PJN/MEDQ  D:  09/15/2003  T:  09/15/2003  Job:  [id number]   cc:   Annita Brod, M.D.

## 2010-07-29 NOTE — H&P (Signed)
Travis Bryan, Travis Bryan NO.:  [id number]   MEDICAL RECORD NO.:  [id number]          PATIENT TYPE:  INP   LOCATION:  2304                         FACILITY:  [facility]   PHYSICIAN:  Sherryl Manges, M.D.  DATE OF BIRTH:  Dec 31, 1970   DATE OF ADMISSION:  06/13/2005  DATE OF DISCHARGE:                                HISTORY & PHYSICAL   PRIMARY CARE PHYSICIAN:  Gust Brooms, M.D., [facility] outpatient  clinic.   CHIEF COMPLAINT:  Weakness and lethargy for approximately six days,  associated with increased thirst and frequent urination.   HISTORY OF PRESENT ILLNESS:  This is a 40 year old male.  For past medical  history, see below.  According to the patient, over the past week or so he  has had increased thirst and frequency of urination, associated with  weakness and lethargy.  He vomited x1 on June 11, 2005, however denies  abdominal pain, diarrhea, fever, shortness of breath or cough.   PAST MEDICAL HISTORY:  1.  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  2.  Hypertension.  3.  Dyslipidemia.  4.  Obesity.   MEDICATIONS:  1.  Clonidine 0.2 mg p.o. q.i.d.  2.  K-Dur 20 mEq p.o. b.i.d.  3.  Lisinopril 40 mg p.o. daily.  4.  Hydralazine 37.5 mg p.o. t.i.d.  5.  Niacin 500 mg p.o. daily.  6.  Lasix 40 mg p.o. daily.  7.  Isosorbide dinitrate 20 mg p.o. t.i.d.  8.  Lopressor one p.o. b.i.d. (query dosage).  9.  Aspirin 325 mg p.o. daily.   ALLERGIES:  No known drug allergies.   REVIEW OF SYSTEMS:  As per HPI and chief complaint.  Otherwise negative.   SOCIAL HISTORY:  The patient is currently unemployed, but used to work as a  Astronomer at [employer].  He has three offspring and two stepdaughters.  Has one sister who is alive and well.  He is a nonsmoker, nondrinker.  Has  no history of drug abuse.  His mother is in her 79s, is hypertensive, has  cardiomyopathy/CHF/diabetes mellitus.  Father's health status is unknown.   PHYSICAL  EXAMINATION:  VITAL SIGNS:  Temperature 100.5, pulse 92 per minute  and regular, respiratory rate 19, BP 157/96 mmHg.  Pulse oximetry 96% on  room air.  GENERAL:  The patient is alert and oriented, not in acute distress,  communicative.  No short of breath at rest.  HEENT:  No clinical pallor, no jaundice, no conjunctival injection.  NECK:  Supple, JVP not seen, no palpable lymphadenopathy, no palpable  goiter.  CHEST:  Clinically clear to auscultation, no wheezes, no crackles.  CARDIAC:  Heart sounds 1 and 2 heard, normal and regular, no murmurs.  ABDOMEN:  Moderately obese, soft and nontender.  There is no palpable  organomegaly, no palpable masses, normal bowel sounds.  EXTREMITIES:  Lower extremity examination:  No pitting edema.  Palpable  peripheral pulses.  MUSCULOSKELETAL:  Quite unremarkable.  CENTRAL NERVOUS SYSTEM:  No focal neurologic deficit on gross examination.   INVESTIGATIONS:  WBC 8.1, hemoglobin 14.3, hematocrit 44.1, platelets 543.  Electrolytes:  Sodium 118, potassium 6.9, chloride 81, CO2 26, BUN 36,  creatinine 2.1, glucose 1340.  Urinalysis is negative.  ABG on room air  shows a pH of 7.38, PCO2 38.7, PO2 79.4, bicarbonate 22.0.  Blood acetone  small.  Chest x-ray dated June 12, 2005, shows cardiomegaly, however no  acute cardiopulmonary disease.  EKG dated June 13, 2005, showed sinus  rhythm, regular, normal axes, no acute ischemic changes, rate 98 per minute.   ASSESSMENT AND PLAN:  1.  Diabetes mellitus, new diagnosis.  This is severe, uncontrolled,      associated with hyperosmolarity.  The patient is clinically dehydrated.      Will hydrate with intravenous infusion of half normal saline and      intravenous insulin infusion. We shall follow electrolytes and manage      appropriately.   1.  History of idiopathic cardiomyopathy.  No clinical evidence of heart      failure.  The patient appears dehydrated, as a matter of fact. We shall      have to watch  fluid status closely.  The patient is being admitted to      ICU for close monitoring.   1.  Dehydration/Acute renal failure, secondary to #1 above.  Follow renal      indices.   1.  Hypertension.  This is uncontrolled.  The patient will restart pre-      admission antihypertensives.   1.  Hyperkalemia.  This is likely to respond to insulin treatment and      hydration, however, we shall monitor closely and address appropriately.   Further management will depend on clinical course.      Sherryl Manges, M.D.  Electronically Signed     CO/MEDQ  D:  06/13/2005  T:  06/13/2005  Job:  [id number]   cc:   Gust Brooms, MD  Fax: [phone]

## 2010-07-29 NOTE — Discharge Summary (Signed)
Travis Bryan, HI NO.:  [id number]   MEDICAL RECORD NO.:  [id number]          PATIENT TYPE:  INP   LOCATION:  2020                         FACILITY:  [facility]   PHYSICIAN:  Minus Liberty, M.D.    DATE OF BIRTH:  06-18-1970   DATE OF ADMISSION:  11/23/2004  DATE OF DISCHARGE:  11/25/2004                                 DISCHARGE SUMMARY   DISCHARGE DIAGNOSES:  1.  Acute exacerbation of congestive heart failure, resolved.  2.  Dilated cardiomyopathy, idiopathic.  3.  Hypertension.  4.  Dyslipidemia (hypertriglyceridemia, hypo-HDL).  5.  Financial barriers to access to medicines.  6.  Obesity.   DISCHARGE MEDICATIONS:  1.  Catapres 0.1 mg twice daily.  2.  K-Dur 20 mEq twice daily.  3.  Lisinopril 40 mg daily.  4.  Hydralazine 37.5 mg three times daily.  5.  Niacin 500 mg each day.  6.  Lasix 40 mg each day.  7.  Isosorbide dinitrate 20 mg three times daily.  8.  Coreg 6.25 mg twice a day.  9.  Aspirin 325 mg each day.   PROCEDURE:  A 2-D echocardiogram  performed on November 24, 2004, which  still has not been read.   CONSULTATIONS:  Cardiology, [place].   HISTORY AND PHYSICAL:  For a full H&P, please consult the chart for the  admission history and physical but in brief, Travis Bryan is a 40 year old  obese African American man with known idiopathic cardiomyopathy diagnosed  two years ago with an ejection fraction of 10 to 15% who presented to the  emergency room with several day history of symptoms of congestive heart  failure.  He had been hospitalized back in June of this year for similar  complaints and had been sent home with some very good medicines that he was  unable to subsequently obtain secondary to financial woes.  He says that he  is in the process of applying for Medicaid and that he is unable to pay for  medications in the interim.  He did not have a PCP at the time of admission.   PHYSICAL EXAMINATION:  VITAL SIGNS:  Temperature 99.7,  blood pressure  194/142, pulse 120, respirations 22, O2 saturation 92% on room air and 98%  on two liters nasal cannula.  NECK:  Positive JVD half way up the  neck to the mandible in a 45 degree  seated position without carotid bruits.  LUNGS:  Bilateral crackles half way up lung fields on both sides with good  air entry.  CARDIOVASCULAR:  Tachycardia with an S3 gallop. No murmurs or rubs audible.  ABDOMEN:  Benign.  EXTREMITIES:  No pitting edema bilaterally.  The rest of his exam was completely benign.   On admission, his BNP was 705.8.  His BMET was normal except for a glucose  of 128.  His CBC was normal.  His chest x-ray showed dilated cardiomyopathy,  pulmonary edema and pulmonary vascular congestion.  EKG showed bilateral  atrial dilatation or dilation, left ventricular hypertrophy, sinus  tachycardia at 120, inverted T-waves in V6 and 0 degree  QRS axis.  His  venous blood gas showed a pH of 7.474, pCO2 of 29.9, and a bicarb of 22.  His first set of cardiac enzymes was significant for an elevated troponin at  0.12 and an elevated CK at 276.  The CK-MB was normal at 3.4 and the  relative index was normal at 1.2.  His liver function tests were also within  normal limits.  His phosphorus was normal at 1.9 and magnesium was normal at  2.0.   HOSPITAL COURSE:  PROBLEM #1 -  CONGESTIVE HEART FAILURE DECOMPENSATION:  This was directly a result of his dilated cardiomyopathy we felt plus the  medical noncompliance due to a lack of finances.  We diuresed him over the  next few days with Lasix and restricting his fluid intake to 1.2 liters  daily and he improved.  He diuresed nicely in response to the Lasix and  improved quickly.  He was placed on isosorbide dinitrate and hydralazine  initially and then when his symptoms got better, a Coreg and beta-blocker  was added.  We followed his BNP during his hospitalization and they dropped  quite nicely.  It went from 705.8 to 292.9 and then at  the time of  discharge, 94.8.  His weight, likewise decreased across the time of his  hospitalization.  He remained chest pain-free throughout the admission and  did not complain of shortness of breath after the first day.   PROBLEM #2 -  DILATED CARDIOMYOPATHY:  This cardiomyopathy was initially  attributed to a virus or some other idiopathic etiology.  We confirmed that  by checking an HIV which was negative as well as a urine drug screen which  was negative.  The patient adamantly insists that he never has done any type  of drugs.  He used to drink up to two beers a day but stopped that over a  year ago when he was told that it could exacerbate his dilated  cardiomyopathy.  Cardiology was consulted, secondary to the elevated  troponins.  They were elevated across all the cardiac enzymes and they  recommended repeating another 2-D echocardiogram.  The last one showed an  ejection fraction of 10 to 15%.  This echo was performed on November 24, 2004, but still has not been read according to the computer.  The plan for  him is to manage him medically and to maximize him on medical therapy.  Dr.  Darrold Span plans to oversee the patient's treatment at an outpatient and if  maximize medical therapy is still insufficient for Travis Bryan, a  biventricular pacer or other invasive options remain.   PROBLEM #3 -  HYPERTENSION:  At admission, the patient's blood pressure was  at 194/142 with symptoms of congestive heart failure, thus qualifying him  for the diagnosis of hypertensive emergency.  Our goal was to decrease his  mean arterial pressure by 25% in the first two hours and that goal was  reached.  By the evening, his blood pressure had normalized to 140/90 in  response to Lasix and Lisinopril and hydralazine.  On the next day, he was  placed on a beta-blocker, and his blood pressures were within normal limits. At discharge, his Catapres was added back, as it had been added at discharge  after  his last hospitalization.   PROBLEM #4 -  DYSLIPIDEMIA WITH PRIOR TRIGLYCERIDE OF 172 AND HDL OF 24 ON  FASTING LIPID PANEL OF August 29, 2004:  Because the patient has not  been  treated since his admission or at least not treated with any regularity, a  fasting lipid profile was not repeated, as it was not expected to change.  He was begun on Niacin 250 mg daily to decrease his triglycerides and  increase his HDL.  He tolerated this dose and at discharge, he was  discharged on a dose of 500 mg daily.  The goal is to increase Niacin by 250  mg a day every four to seven days until the patient is taking 2 g daily and  then thereafter to increase by 250 to 500 mg daily every two to four weeks  for a maximum dose of 6 g daily.  This is a dirt cheap medication that works  well if the patient can tolerate it.  It is highly advisable that he stay on  this medication if he can tolerate it, as it will accomplish both of his  goals, that is to decrease his triglycerides and increase his HDL or good  cholesterol.   PROBLEM #5 -  LACK OF FUNDS/PENDING MEDICAID STATUS:  At admission, it was  requested that case management or social work come by and look over the  patient's situation and on the day of discharge, case worker came by and  facilitated in obtaining two weeks of medications for the patient so that he  may be covered before his follow-up visit in the outpatient clinic at [facility].  Also on discharge today, we sent him down to  speak with Bonna Gains in the outpatient clinic who deals with financial  issues and financial barriers to medical care and medicines.  If he does not  qualify for Medicaid in he end, as his Medicaid application is pending, and  if he does not qualify for health care share, the last option is to obtain  pain medications for him through the drug assistance programs that are  managed at the outpatient clinic by [facility].  Dr. Darrold Span is very  familiar  with this patient and plans to follow  him as an outpatient as his primary  care physician.  He is certain to make good on this and take care of the  medication issues so that at least lack of medications will not be a factor  in Travis Bryan' future hospitalizations.   PROBLEM #6 -  MORBID OBESITY:  This problem was not addressed during his  hospitalization except to put him on a heart healthy diet, recommend a heart  health diet to him.  He is going to need extensive education both for him  and his wife as an outpatient about living with congestive heart failure and  living with dilated cardiomyopathy.  As someone with  heart disease already  and hypertension, it would behoove him to engage in a weight loss program to  minimize other risk factors for cardiac disease.  He seems to require quite  a deal of education as he demonstrated poor appreciation for the implications of his illness or the types of lifestyle changes he might need  to make in order to manage it.  On the day of discharge, Travis Bryan was  symptom-free and back to his normal state.  Temperature was 98.2, blood  pressure 113 to 134/67 to 91, pulse 87 to 100, respirations 18 to 20.  He  was negative at least two liters.  Saturations were greater than 96% on room  air.   On physical examination on the day  of discharge, there was no gallop heard  and although he was slightly tachycardic still.  No crackles or wheezes were  heard in the lung fields.  There was no pitting edema nor was there calf  tenderness.   His CBC was normal with a hemoglobin 13.3, hematocrit 40, white cell count  7.1 and platelets 347.  The BMET was likewise normal with one exception, the  creatinine which was 1.6.  It had climbed up from 1.3 to 1.5 and then  subsequently to 1.6 with Lasix administration.  He was receiving quite a bit  of Lasix during his hospitalization and this is expected to normalize with  normal p.o. intake and only 40 mg  of Lasix daily.  The rest of the BMET  showed a sodium of 138, potassium 3.5, chloride 104, bicarb 27, BUN 15,  again creatinine 1.6 and glucose 118.  Urine drug screen was negative. HIV  was negative.  BNP was 94.8 and a 2-D echo was pending.  This should be  followed up as an outpatient.      Minus Liberty, M.D.     IN/MEDQ  D:  11/25/2004  T:  11/25/2004  Job:  [id number]   cc:   Gust Brooms, MD  Fax: [phone]

## 2010-07-29 NOTE — Discharge Summary (Signed)
Travis Bryan, Travis Bryan               ACCOUNT NO.:  [id number]   MEDICAL RECORD NO.:  [id number]          PATIENT TYPE:  INP   LOCATION:  0153                         FACILITY:  [facility]   PHYSICIAN:  Sherryl Manges, M.D.  DATE OF BIRTH:  06-15-1970   DATE OF ADMISSION:  08/28/2004  DATE OF DISCHARGE:                                 DISCHARGE SUMMARY   DIAGNOSES:  1.  Decompensation of congestive cardiomyopathy.  2.  Hypertension, severe, uncontrolled.  3.  History of dyslipidemia.  4.  Medical noncompliance, secondary to financial issues in the past 1-2      months.  5.  Smoker.   DISCHARGE MEDICATIONS:  To be determined at the time of discharge.   PROCEDURES:  1.  Chest x-ray dated on August 28, 2004:  This showed findings most      compatible with congestive heart failure.  2.  Renal ultrasound, dated August 28, 2004:  This showed no hydronephrosis or      renal lesion, i.e., essentially normal renal ultrasound.  3.  A 2D echocardiogram done on August 29, 2004:  This showed mildly dilated      left ventricle.  Overall left ventricular systolic function.  Altogether      reduced LVEF, which was estimated to being between 10-15%.  There was      severe diffuse left ventricular hypokinesis.  The ventricular wall      thickness was mildly increased.  There was trivial aortic valvular      regurgitation, mild mitral regurgitation.  The left atrium was mildly      dilated.  The pulmonary veins were grossly normal.  Right ventricular      systolic function was markedly reduced.   CONSULTATIONS:  Dr. Dixie Dials, cardiologist.   ADMISSION HISTORY:  As in H&P notes of August 28, 2004; however, this is a 40-  year-old African-American male with a background history of idiopathic  cardiomyopathy, ejection fraction 10-20% on 2D echocardiogram of July, 2005,  and also hypertension, who presents with a one-week history of shortness of  breath on exertion and profound orthopnea.  According to the  patient,  because of financial reasons, he has been  unable to be compliant with his  medications over the past 1-2 months.  Denied chest pain, palpitations.  Was  found to be clinically in heart failure with markedly uncontrolled  hypertension and was admitted for evaluation, investigation, and management.   CLINICAL COURSE:  Problem 1.  Severe uncontrolled hypertension:  At the time  of initial evaluation, the patient's blood pressure was found to be markedly  elevated at 205/160 mmHg.  The patient was initially managed with  intravenous nitroglycerin in the emergency department and subsequently with  intravenous Nipride, i.e., sodium nitroprusside, with good blood pressure  control.  He was subsequently commenced on ACE inhibitor, Coreg, and BiDil,  with vastly improved blood pressure control.   Problem 2:  Congestive heart failure:  This is secondary to dilated  cardiomyopathy.  Patient has a background history of idiopathic  cardiomyopathy diagnosed in July, 2005.  At that time,  he had the 2D  echocardiogram which showed an ejection fraction between 10-20%.  At that  time, this was managed with ACE inhibitors and diuretics.  The patient  presents on this occasion after 1-2 months of medical noncompliance.  Initial evaluation showed clinical features of congestive heart failure,  which was supported by chest x-ray findings dated August 28, 2004.  He was  managed with diuretics, ACE inhibitors, and BiDil with good clinical  response.  A 2D echocardiogram dated August 29, 2004 confirmed dilated  cardiomyopathy.  It is also felt that #1 above, i.e., uncontrolled  hypertension, may have been contributory to his present decompensation.  He  has been  offered cardiac catheterization by Dr. Doylene Canard, who was called in  on cardiology consultation, and this is still pending.   Problem 3:  History of dyslipidemia:  Patient was previously on niacin for  dyslipidemia.  He was continued on this  during the course of his hospital  admission; however, evaluation of fasting lipid profile shows the following  findings:  Total cholesterol 150, triglycerides 172, HDL 24, LDL 92.  This  appears quite reasonable, however, should the patient prove to have  significant coronary artery disease on cardiac catheterization, he may well  benefit from Statin treatment.   Problem 4:  Smoking history:  Patient is a smoker who utilizes cigars.  He  has been counseled to quit smoking and offered Nicoderm patch, which he has  declined.  He wants to quit on his own.  He has therefore been encouraged  accordingly.   DISPOSITION:  This is to be determined at the time of discharge; however,  the patient will certainly need outpatient follow-up.  Will need to  establish a PMD.  He was previously referred in July, 2005 to [facility],  but I understand that because of his financial status at that time, he was  rejected.  Social Work consultation has been called to help address these  concerns, particularly as the patient is unable to financially afford his  medications, according to him.  He will also need cardiology followup, which  is to be arranged at the time of discharge.  Further disposition  arrangements will be added at the time of actual discharge.       CO/MEDQ  D:  08/30/2004  T:  08/30/2004  Job:  [id number]   cc:   Birdie Riddle, M.D.  [address]  [place] [address]  Fax: [phone]

## 2010-07-29 NOTE — H&P (Signed)
Travis Bryan, Travis Bryan               ACCOUNT NO.:  [id number]   MEDICAL RECORD NO.:  [id number]          PATIENT TYPE:  INP   LOCATION:  0153                         [facility]ACILITY:  [name]   PHYSICIAN:  Levy Sjogren, M.D.     DATE OF BIRTH:  07/26/1970   DATE OF ADMISSION:  08/28/2004  DATE OF DISCHARGE:                                HISTORY & PHYSICAL   PRIMARY CARE PHYSICIAN:  Unassigned.   REASON FOR ADMISSION:  Chest pain, shortness of breath.   HISTORY OF PRESENT ILLNESS:  The patient is a 40 year old African-American  male with a three day history of shortness of breath and midsternal chest  pressure brought upon by exertional efforts and relieved by rest associated  with no nausea, no vomiting, no sweating, no palpations.  The patient  finally could not take it anymore and came to the emergency room to be  evaluated.  In the emergency room, he was found to be very hypertensive and  in congestive heart failure.   PAST MEDICAL HISTORY:  1.  CHF with ejection fraction of 10 to 20%.  2.  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  3.  Hypertension, poorly controlled.  4.  Dyslipidemia.  5.  Noncompliance.   PAST SURGICAL HISTORY:  None.   FAMILY HISTORY:  Positive for hypertension.   SOCIAL HISTORY:  He smokes some cigars.  Drinks one to two beers a day.  Is  married, has children.  Works as Social research officer, government.   ALLERGIES:  No known drug allergies.   CURRENT MEDICATIONS:  None.   REVIEW OF SYSTEMS:  CONSTITUTIONAL:  No fevers, chills, or night sweats.  No  weight change.  GASTROINTESTINAL:  No nausea, vomiting, diarrhea, or  constipation.  GENITOURINARY:  No dysuria, hematuria, or retention.   PHYSICAL EXAMINATION:  GENERAL:  Oriented x3.  No acute distress.  NECK:  Positive JVD, no bruits.  CHEST:  Clear to auscultation bilaterally.  No wheezes, rhonchi, or rales.  HEART:  Regular rate and rhythm.  No murmurs, rubs, or gallops.  ABDOMEN:  Soft, nontender,  nondistended.  Normoactive bowel sounds.  EXTREMITIES:  No cyanosis, clubbing, or edema.  NEUROLOGIC:  Grossly negative.   STUDIES:  CBC showed normal white count, platelets, and hemoglobin.  Coagulation studies were all within normal limits.  Electrolytes were all  within normal limits.  Glucose was 154.  Liver function's were normal.  UA  was negative.  BNP was elevated at 720.  Chest x-ray showed bilateral  pulmonary edema.  EKG showed LVH.   ASSESSMENT:  1.  Hypertensive emergency.  2.  Congestive heart failure exacerbation.   PLAN:  Admit to the intensive care unit, start nitro-prusside drip, overlap  with oral Norvasc, Lisinopril, and hydrochlorothiazide.  The plan is to  admit to intensive care unit, start Nitro-prusside drip, and Norvasc, and  Lisinopril, and hydrochlorothiazide.  Check cardiac enzymes q.8h. x3.       SA/MEDQ  D:  08/28/2004  T:  08/28/2004  Job:  [id number]

## 2010-07-29 NOTE — Consult Note (Signed)
Travis Bryan, Travis Bryan               ACCOUNT NO.:  [id number]   MEDICAL RECORD NO.:  [id number]          PATIENT TYPE:  INP   LOCATION:  0153                         FACILITY:  [facility]   PHYSICIAN:  Birdie Riddle, M.D.  DATE OF BIRTH:  1971/01/16   DATE OF CONSULTATION:  DATE OF DISCHARGE:                                   CONSULTATION   IMPRESSION:  1.  Axillary hypertension.  2.  Cardialated cardiomyopathy with congestive heart failure.  3.  Dyslipidemia.  4.  Tobacco abuse.   RECOMMENDATIONS:  1.  We agree with most of the antihypertensive medications  2.  We will change the hydrochlorothiazide to p.o. Lasix and add KCL due to      a complaint of Lasix and a lot potassium of 3.7.  Add Clonidine until      the blood pressure comes down.  Add Bidil small dose for long term      benefit.  Check a 2-D echocardiogram tomorrow.   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   PAST MEDICAL HISTORY:   PAST MEDICAL HISTORY:  Positive for smoking and positive for alcohol use.  Also positive for hyperlipidemia. Negative for MI.   PAST PHYSICAL HISTORY:  None.   MEDICATIONS:  Current medications include:  1.  Norvasc 1 mg daily.  2.  Hydrochlorothiazide 25 mg one daily.  3.  Aspirin 81 mg one daily.  4.  Colace 100 mg daily.  5.  Niacin 500 mg one daily.  6.  Lasix 40 mg IV x1.   ALLERGIES:  None.   SOCIAL HISTORY:  The patient is married and works as a Astronomer.   PHYSICAL EXAMINATION:  VITAL SIGNS:  Pulse 84, respirations 20, blood  pressure 130/95, height 5 feet 15 inches oxygen saturation 98% on 2L of  oxygen.  HEENT:  Normocephalic atraumatic with brown eyes. Pupils equal, round, and  reactive to light.  Extra ocular movements intact.  NECK:  No JVD, no carotid  bruits.  LUNGS:  Clear bilaterally.  HEART:  Normal S1 S2  with a grade 2/7 systolic murmur.  ABDOMEN:  Soft and non-tender.  EXTREMITIES:  No edema.  CENTRAL NERVOUS SYSTEM:  The patient moves all four extremities.   LABORATORY DATA:  Normal electrolytes, normal BUN, normal creatinine of 1.6,  normal sugar of 108, normal hemoglobin hematocrit, near normal hemoglobin  A1c.  CK slight elevated but MB is normal x2 and troponin-I is slightly  elevated at 0.13 and 0.14.  Urinalysis showed a little bit of protein but  otherwise unremarkable.  EKG revealed a sinus tachycardia with a left  ventricular hypertrophy.  Chest x-ray revealed cardiomegaly with congestive  heart failure.   Thank you for the consultation.       ASK/MEDQ  D:  08/28/2004  T:  08/29/2004  Job:  [id number]

## 2010-07-29 NOTE — Cardiovascular Report (Signed)
NAMECLINTON, Travis Bryan NO.:  [id number]   MEDICAL RECORD NO.:  [id number]          PATIENT TYPE:  OUT   LOCATION:  CATH                         FACILITY:  [facility]   PHYSICIAN:  Birdie Riddle, M.D.  DATE OF BIRTH:  02-May-1970   DATE OF PROCEDURE:  08/31/2004  DATE OF DISCHARGE:                              CARDIAC CATHETERIZATION   REFERRING PHYSICIAN:  Sherryl Manges, M.D.   PROCEDURES:  Right and left heart catheterization, selective coronary  angiography, and cardiac output study.   INDICATIONS FOR PROCEDURE:  This 40 year old black male with dilated  cardiomyopathy has ejection fraction of around 15%, along with hypertension  and abnormal electrocardiogram.   APPROACH:  Right femoral artery using 5 French sheath in he right femoral  vein using 7 French sheath and Swan-Ganz catheter.   COMPLICATIONS:  None.   Around 50 cc of dye was used.   HEMODYNAMIC DATA:  The left ventricular pressure was 130/70, the aortic  pressure was 132/103.  The right ventricular pressure was 55/12, and the  right atrial pressure was 16/11.  The pulmonary artery pressure was 53/28.  The pulmonary capillary wedge pressure was 29/26.  The cardiac output was  5.5, by thermal dilation method, and 6.6 by Fick method.  Cardiac index was  2.4 by thermal dilation method and 2.9 by Fick method.  The body surface  area was 2.25 sq/m.  Saturations were 94% in blood sample from aorta ad 69%  in pulmonary artery.  Heart rate was 91.   CORONARY ANGIOGRAPHY:  1.  The left main coronary artery was double barreled.  2.  The left anterior descending coronary artery had 20% proximal lesion.      The diagonal #1 was unremarkable, and diagonal #2, also was a small      vessel.  3.  The left circumflex coronary artery was dominant, had proximal 20 to 30%      lesion.  A ramus branch was a very small vessel, obtuse marginal branch      #1 and #2 were slender vessels, and posterior descending  coronary artery      was unremarkable.  4.  The right coronary artery was nondominant with slender distal vessels      with spasm, otherwise, unremarkable.   IMPRESSION:  1.  Mild multivessel coronary artery disease.  2.  Dilated cardiomyopathy from electrocardiographic evaluation, ejection      fraction of about 15%.  3.  Moderate pulmonary hypertension.   RECOMMENDATIONS:  This patient will be treated medically for now with the  possible addition of Lanoxin and Coumadin.       ASK/MEDQ  D:  08/31/2004  T:  08/31/2004  Job:  [id number]

## 2010-07-29 NOTE — Discharge Summary (Signed)
NAMEADVITH, JANOTA               ACCOUNT NO.:  [id number]   MEDICAL RECORD NO.:  [id number]          PATIENT TYPE:  INP   LOCATION:  2035                         FACILITY:  [facility]   PHYSICIAN:  Thomes Lolling, M.D.    DATE OF BIRTH:  December 23, 1970   DATE OF ADMISSION:  06/13/2005  DATE OF DISCHARGE:  06/15/2005                                 DISCHARGE SUMMARY   DISCHARGE DIAGNOSES:  1.  Newly diagnosed diabetes mellitus presenting with hyperosmolar and non-      ketotic coma, resolved.  2.  Non-ischemic cardiomyopathy with previously calculated ejection fraction      of 15%.  3.  Hypertension.  4.  Hyperlipidemia.  5.  Past history of alcohol and substance abuse.   DISCHARGE MEDICATIONS:  1.  Lasix 40 mg p.o. daily.  2.  Lisinopril 10 mg p.o. daily.  3.  Hydralazine 37.5 mg p.o. t.i.d.  4.  __________ 1 g daily.  5.  Imdur 20 mg t.i.d.  6.  Lopressor 50 mg p.o. b.i.d.  7.  Aspirin 325 mg p.o. daily.  8.  Glipizide 10 mg p.o. daily.  9.  Metformin 500 mg p.o. b.i.d.   CONSULTANTS:  None.   PROCEDURES:  Patient had a 2-D echocardiogram on June 13, 2005 consistent  with a mildly decreased left ventricular systolic function with an ejection  fraction of 50% with mild diffuse left ventricular hypokinesis and increased  contribution of atrial contraction to left ventricular filling.   HISTORY AND PHYSICAL EXAMINATION:  For full details please refer to the  chart but in brief Mr. Donn is a 40 year old African-American male with a  history of a dilated non-ischemic cardiomyopathy and EF of 15% who presented  to the [facility] Emergency Room on the night of April 2 complaining of one  week history of malaise, polyuria, polyphagia, and polydipsia, denying  nausea and abdominal pain and review of systems otherwise negative as well.  He was initially admitted to a hospitalist service secondary to there being  no beds at [facility] and was subsequently transferred to our service on  April 3.   VITALS UPON ADMISSION:  Temperature 99.1, blood pressure 108/45, respiratory  rate 20 with an O2 saturation of 97%.   ADMISSION LABORATORIES:  White count of 8.1, hemoglobin of 14.3 with a  glucose of 1300.  Sodium 131, potassium 3.4, chloride 95,  CO2 24, BUN 28,  creatinine 1.7.  Cardiac enzymes are negative x3.  The urinalysis shows some  trace ketones and greater than 1000 of glucose.  His TSH was normal at  2.054.   HOSPITAL COURSE:  #1 - NEWLY DIAGNOSED DIABETIC PRESENTING WITH HYPEROSMOLAR  NON-KETOTIC COMA:  Initially was hydrated, placed n.p.o. and on an insulin  drip.  Was treated as a regular [facility] patient.  Was subsequently transitioned  over to subcutaneous insulin with good results.  C-peptide levels were  normal at 1.09 indicating to [place] that he is still producing insulin which  leads [place] to believe that his problem is more of insulin resistance than  insulin non-production so we started him  on Glipizide 10 mg.  His creatinine  was slightly elevated at 1.7 which was why we did not initially start him on  Metformin but on day of discharge his creatinine has normalized back down to  1.2-1.3 range so we have initiated him as well on Metformin 500 mg p.o.  b.i.d.  He will follow up with me, Dr. Jerilee Hoh, in the outpatient clinic  on Monday, April 9 to check a stat BMET and monitor his glucose level and he  will see his primary care physician, Dr. Darrold Span, again in the [facility] again on April 13.   #2 - NON-ISCHEMIC DILATED CARDIOMYOPATHY:  Initially thought to have an EF  of 15% per previous echocardiograms.  New 2-D echocardiogram performed this  hospitalization shows only a mildly reduced ejection fraction of 50% with  mild diffuse left ventricular hypokinesis.  Given these findings with  improved ejection fraction we believe that the most likely diagnosis for his  elevated cardiomyopathy was of alcoholic causes and since he stopped his   alcohol abuse his cardiac function has slowly improved.  He will be  discharged on all of his previous medications.  Lasix dose will be decreased  from 80 daily to 40 daily which is what he was receiving at the hospital and  he had no evidence of volume overload such as pulmonary edema or peripheral  edema.   #3 - HYPERTENSION:  As stated, he will be discharged on his previous  medications.  Lasix is to be cut in half and we will discontinue the  clonidine as it does have negative effects on cardiac contractility and his  pressures have remained stable at the 140 range only on his lisinopril 10  mg.  We will restart his hydralazine, Imdur, and send him home on that  regimen.   #4 - HYPERLIPIDEMIA:  He will resume his Niacin at home.   DISPOSITION AND FOLLOW-UP:  He has a hospital follow-up appointment at the  outpatient clinic on April 9 and he will see his PCP, Dr. Darrold Span on April 13  for regular management of his cardiomyopathy and his newly diagnosed  diabetes.  I will also try to schedule an appointment with Barnabas Harries, our  diabetes educator.      Domingo Mend, M.D.      Thomes Lolling, M.D.  Electronically Signed    EH/MEDQ  D:  06/15/2005  T:  06/16/2005  Job:  [id number]

## 2010-07-29 NOTE — Discharge Summary (Signed)
Travis Bryan, Travis Bryan               ACCOUNT NO.:  [id number]   MEDICAL RECORD NO.:  [id number]          PATIENT TYPE:  INP   LOCATION:  [id number]                         FACILITY:  [place] [place]:  Lolita Lenz, M.D.DATE OF BIRTH:  11/25/1970   DATE OF ADMISSION:  08/28/2004  DATE OF DISCHARGE:  09/01/2004                                 DISCHARGE SUMMARY   CONSULTANTS:  Birdie Riddle, M.D., cardiology.   FOLLOW UP:  With [facility], Dr. Tomma Lightning.   DISCHARGE DIAGNOSES:  1.  Congestive heart failure exacerbation.  2.  Dilated idiopathic cardiomyopathy.  3.  Accelerated hypertension.  4.  Dyslipidemia.  5.  Tobacco use.   DISCHARGE MEDICATIONS:  1.  Lisinopril 40 mg p.o. q.d.  2.  Coreg 6.25 mg p.o. b.i.d.  3.  Niacin 500 mg p.o. q.d.  4.  Lasix 40 mg p.o. q.d.  5.  K-Dur 20 mg p.o. b.i.d.  6.  BiDil 20/37.5 p.o. b.i.d.  7.  Catapres 0.1 mg p.o. b.i.d.  8.  Coumadin 5 mg p.o. q.d.  9.  Aspirin 325 mg p.o. q.d.  10. Plavix 75 mg p.o. q.d.  11. Colace 100 mg p.o. b.i.d. p.r.n.   FOLLOWUP APPOINTMENTS:  1.  Birdie Riddle, M.D., at [address] phone number [phone]      [phone], on June 27 at 11:30 a.m.  2.  [facility] in 1-2 weeks.   PROCEDURE:  1.  Cardiac catheterization on August 31, 2004, showing mild multivessel      coronary artery disease with dilated cardiomyopathy, moderate pulmonary      hypertension.  2.  Echocardiogram from 08/29/2004 shows dilated left ventricle with EF 10-      15% with diffuse left ventricular hypokinesis with also reduced right      ventricular systolic function.   HOSPITAL COURSE:  The patient is a 40 year old African-American male with a  history of uncontrolled hypertension and congestive heart failure who was re-  admitted for accelerated hypertension and exacerbation of his dilated  cardiomyopathy with decompensated CHF.  He was initially placed in the ICU  and started on nitroprusside to control his blood  pressure and started on IV  diuretics, ACE inhibitors, and afterload reduction.  Once the patient became  euvolemic and was stabilized, he was transferred to the floor and subsequent  cardiology consult was obtained.  Repeat echocardiogram verified the patient  continued to have dilated cardiomyopathy with a depressed left ventricular  systolic function with an EF of 10-15% which is unchanged from  echocardiogram from July 2005.  Dr. Doylene Canard then proceeded with cardiac  catheterization which showed mild multivessel coronary artery disease.  At  this time, the patient will be medically managed with ACE inhibitor, beta  blocker, afterload reducer, Niacin to increase his HDL level, and Lasix and  K-Dur to get a euvolemic state, the BiDil as a nitrate as well, and alpha  blocker with Catapres.  Due to his depressed ejection fraction, he is at  high risk for thrombus formation.  Therefore, he will be placed on Coumadin  as well  as aspirin and Plavix.  His anticoagulation will be checked in 1  week's time when he follows up with Dr. Doylene Canard for his INR level.  Have  discussed with the patient the importance of being compliant with his  medications.  We are in the process of trying to assist the patient with  Medicaid application so that he will be able to afford his medications, as  this will be very important in not only keeping him out of the hospital but  also to prevent morbidity and mortality.  It is also thought that his  cardiomyopathy, although idiopathic, may have a contribution from alcohol as  well and so he is advised against this as well as tobacco cessation.  At the  time of discharge, the patient's condition is improved.   PHYSICAL EXAMINATION:  Vital signs:  He is afebrile with temperature 97.7,  blood pressure 118/73, O2 saturation 98% on room air, discharge weight 230  pounds.  Lungs:  Clear to auscultation.  Heart rate is regular with normal  S1 and S2.  No murmurs are  appreciated.  He has no peripheral edema.   PERTINENT DISCHARGE LABORATORY:  Hemoglobin 13.1, platelets 393, white count  6.1, BUN 10, creatinine 1.3, potassium 4.0.  Sodium 137, chloride 106, CO2  25, glucose 115, calcium 8.7.   FOLLOW UP:  As noted, the patient is to follow up with Dr. Doylene Canard for his  cardiology followup and then [facility] for general medical issues.  At  followup, he will need to have an INR level checked.       RLK/MEDQ  D:  09/01/2004  T:  09/01/2004  Job:  [id number]   cc:   Birdie Riddle, M.D.  [address]  [place] [address]  Fax: [place] Tomma Lightning, M.D.  [phone]  [place] [address]  Fax: [phone]

## 2010-07-29 NOTE — Discharge Summary (Signed)
Travis Bryan, Travis Bryan                           ACCOUNT NO.:  [id number]   MEDICAL RECORD NO.:  [id number]                   PATIENT TYPE:  INP   LOCATION:  [id number]                                 FACILITY:  [facility]   PHYSICIAN:  Jon Gills, M.D.             DATE OF BIRTH:  08-Jul-1970   DATE OF ADMISSION:  09/15/2003  DATE OF DISCHARGE:  09/21/2003                                 DISCHARGE SUMMARY   DISCHARGE DIAGNOSES:  1. Congestive heart failure with an ejection fraction of 10-20% thought     secondary to uncontrolled hypertension and possibly a viral     cardiomyopathy.  The patient ruled out for myocardial infarction.  2. Hypertension.  3. Dyslipidemia.   DISCHARGE MEDICATIONS:  1. Ecotrin 81 mg p.o. daily.  2. Coreg 3.125 mg p.o. b.i.d.  3. Lasix 40 mg p.o. daily.  4. Lisinopril 40 mg p.o. daily.  5. Niacin 500 mg p.o. daily.  6. Kay-Ciel 20 mEq p.o. b.i.d.  7. Coumadin 12.5 mg just for tonight and the patient would have received     this today prior to discharge.  The rest is as per the [facility].  Until he is seen at [facility], he needs to be taking 10 mg a     day.  However, he has been told that he must be seen at [facility]     tomorrow for them to do a Coumadin check on him tomorrow.  8. Hydrochlorothiazide 25 mg p.o. daily.   DISCHARGE ACTIVITY:  As tolerated.  The patient has been given the Coumadin  booklet.   DISCHARGE DIET:  A low-salt diet has been recommended, which he apparently  has already been following.  The patient is also to follow the instructions  as written in the Coumadin booklet.   SPECIAL INSTRUCTIONS:  The patient has been advised to take Coumadin the  same time every day so that he remembers to take it.  Have also stressed the  importance of following through on all of his followup visits and ensuring  that he takes his medicines regularly.  He has also been told that if there  are any new medicines added to his regimen by  any other doctors, the doctor  who is monitoring his Coumadin needs to be aware of it so that they can tell  him when his next INR checks need to be made.  The patient was also advised  to bring the instruction sheet that we are giving him to the [facility]  visit so that they have preliminary information on what is needed to be  done.   FOLLOWUP:  Followup will be with [facility] tomorrow and with Dr. Acie Fredrickson in  cardiology in approximately one week.  The patient was given Dr. Elmarie Shiley  business card by Dr. Acie Fredrickson himself.   HOSPITAL COURSE:  This 40 year old African American gentleman with a past  medical history of hypertension for an unknown period of time was admitted  with complaints of shortness of breath.  He was found to be in frank heart  failure with significantly elevated BNP.  His BNP was 964 at the time of  admission.  His cardiac markers have been negative x 3, which ruled him out  for a myocardial infarction.  His EKG showed left ventricular hypertrophy.  Otherwise it was normal sinus rhythm.  The patient was brought in, placed on  telemetry, and started on IV diuretics and hypertensive medications.  Cardiology was also consulted in light of his presentation in case they  needed to do any further interventional studies on him.  A 2-D  echocardiogram was obtained and that showed markedly decreased left  ventricular systolic function with an EF of 10-20%.  He also had features  consistent with severe diastolic dysfunction.  There was no valvular disease  noted of the aortic valve or mitral valve.  There was no left atrial  thrombus noted either.  He did have some mild tricuspid regurgitation.  Also, the echocardiogram was negative for any pericardial effusion.  His  presentation story was told to the admitting doctor.  He apparently had been  told in the past that his blood pressure was high, but he never followed up  with this and he has never taken any medications for  blood pressure either.  The patient apparently was doing well a few days prior to admission when he  started complaining of increasing shortness of breath and dyspnea on  exertion.  He denied any chest pain, palpitations, fever, or chills.  He did  have some complaints of cough.  In the emergency room, the patient was given  potassium 40 mg IV x 1 and some labetalol to bring his blood pressure down.  He was also noted to have a potassium of 3.3 and a creatinine of 1.5.  At  the time of discharge, his electrolyte panel one prior to discharge showed a  sodium of 140, a potassium of 3.7, a chloride of 102, a CO2 of 34, a BUN of  18, a creatinine of 1.6, a glucose of 107, and a calcium of 9.2.  Based on  the recommendations of cardiology with his poor EF, Coumadin therapy was  initiated for anticoagulation.  The patient has slowly trended up on his  INR.  On the day of discharge, his INR is 1.4.  The goal is to get it in the  2-3 range.  His PT today is 16.  The patient has been significantly  inpatient and is also getting very antsy about being in the hospital,  especially since he does not have insurance and financially it is putting a  strain on him.  With all of these considerations, cardiology and the [facility] were comfortable with him going home with an INR of 1.4,  provided he follows up with [facility].  Our case manager has been involved  with his case and they have given him information on [facility].  He also  has paperwork for an application for Medicaid.  I have stressed the  importance of making sure that he takes his blood pressure medications, the  dangers of what happens if he is not consistent with his Coumadin or his  medicines.  Our case manager will be obtaining some help from the hospital  system to get him some of his medicines for at least a week until he can get  these through [facility].  Once again, the patient has been given his Coumadin dose of  12.5 mg today with followup with [facility] tomorrow to  get his Coumadin check done.  Since his lisinopril was increased over the  weekend from 20 mg to 40 mg and he was taken off of hydralazine because we  were going up on the lisinopril and to simply his medications some, he will  need a repeat BMET done in a few days.  In fact would recommend that he get  a BMET done at the time of visit with [facility] tomorrow.  The patient has  been advised to bring his discharge sheet that he was given at the time of  discharge to his visit with [facility].   As mentioned earlier, the patient did present with hypertensive urgency.  His initial blood pressure was 185/142 with a heart rate of 116.  He has now  trended very nicely down on his blood pressure.  At the time of discharge,  his blood pressure is 129/82.  This is on Coreg, hydrochlorothiazide, Lasix,  and Prinivil.  As mentioned earlier, he was taken off of his  hydrochlorothiazide just to streamline and try and simply his medications as  much as possible.  I have informed him once again the importance of taking  these medications and that he will be on a significantly large number of  medicines than what he came in with.   The patient did have a fasting cholesterol panel done during his hospital  stay here.  Overall they were actually fairly decent, but did show some  dyslipidemia.  His total cholesterol was 155, his triglycerides were 126,  and his LDL was 103, but his HDL was low at 27.  Based on this information,  I had advised him to be on niacin to try and raise that HDL up.  Further  adjustments on the niacin will need to be made per the [facility] doctor to  get his HDL at goal, which would be greater than 40.   Once again the patient has been stressed the importance of being on his  medicines and following up with his new doctors at [facility].   His BNP on September 18, 2003, has come down to 190 and clinically he is no longer   in congestive heart failure.  He did have a TSH done as part of his workup  and that was normal at 2.219.                                               Jon Gills, M.D.    RRV/MEDQ  D:  09/21/2003  T:  09/21/2003  Job:  [id number]   cc:   Thayer Headings, M.D.  [address], [place]  [place] [address]  Fax: [phone]   [facility]

## 2010-07-29 NOTE — H&P (Signed)
Travis Bryan, Travis Bryan NO.:  [id number]   MEDICAL RECORD NO.:  [id number]                   PATIENT TYPE:  INP   LOCATION:  0103                                 FACILITY:  [facility]   PHYSICIAN:  Annita Brod, M.D.            DATE OF BIRTH:  1971/01/27   DATE OF ADMISSION:  09/15/2003  DATE OF DISCHARGE:                                HISTORY & PHYSICAL   ATTENDING PHYSICIAN:  Annita Brod, M.D.   PRIMARY CARE PHYSICIAN:  None.   CONSULTATIONS:  Thayer Headings, M.D., cardiology.   CHIEF COMPLAINT:  Shortness of breath.   HISTORY OF PRESENT ILLNESS:  This is a 40 year old African-American male  with a past medical history of hypertension for an unknown length of time,  who does not see a doctor or take any medications for this, who comes in  complaining of shortness of breath for the past few days, and found to have  new onset congestive heart failure.  The patient tells me that overall he  has been previously well, although he does not see a doctor. He was told  sometime in the past that he has had high blood pressure, but has never  followed up with this or taken any medications for this.  The patient tells  me that he was doing well, but a few days ago he started complaining of some  increasing shortness of breath and dyspnea on exertion.  This continued to  worsen, and the patient came in.  He complained of a cough, but no sputum  could be produced.  He also denied any type of chest pain or palpitations or  fevers or chills.  The patient came into the emergency room, and he was noted to have a blood  pressure of 185/142 with a heart rate of 116.  His O2 saturation was 96% on  room air.  A chest x-ray done showed evidence of pulmonary edema.  His  laboratory work checked noted that the patient had a potassium of 2.8,  creatinine 1.5, a beta natriuretic peptide of 964.  The cardiac enzymes were  checked and found to be normal.  An  electrocardiogram was done on the  patient, showing some left ventricular hypertrophy.  No evidence of any  acute severe ST elevations were noted.  The patient was given some  replacement potassium, as well as IV Lasix 40 mg and some labetalol.  His  blood pressure since then has come down to as low as 149/104.  He has  started to diurese as well.  Currently the patient states that his breathing  is better.  He still complains of some mild shortness of breath. He denies  any chest pain.  He denies any headaches, visual changes, dysphagia, nausea,  vomiting, or palpitations.  He denies any abdominal pain, hematuria,  dysuria, constipation, or diarrhea.  No focal extremity weakness or  numbness.  PAST MEDICAL HISTORY:  1. Hypertension.  2. Tobacco abuse, which the patient tells me that he recently gave up.  He     is also noted to have marijuana on his urine drug screen, and he tells me     that he drinks approximately one to two beers a day.   MEDICATIONS:  None.   ALLERGIES:  No known drug allergies.   SOCIAL HISTORY:  The patient appears to drink, smoke and occasionally smoke  marijuana.   FAMILY HISTORY:  Diabetes.   PHYSICAL EXAMINATION:  VITAL SIGNS:  On admission temperature 99.1 degrees,  heart rate 116, blood pressure 185/142, now 149/104, respirations 16, O2  saturation 96% on room air.  GENERAL:  He appears alert and oriented x3, and currently in no apparent  distress.  HEENT:  Normocephalic, atraumatic.  Mucous membranes moist.  NECK:  He has no carotid bruits.  HEART:  A regular rate and rhythm with a 2/6 systolic ejection murmur.  LUNGS:  He has decreased breath sounds throughout with bibasilar crackles  approximately going one-third up each way.  ABDOMEN:  Soft, nontender, nondistended.  Positive bowel sounds.  EXTREMITIES:  No clubbing or cyanosis.  He has a 1+ pitting edema.  He has  2+ pulses.  NEUROLOGIC:  He has no focal neurological deficits or loss of  sensation.   LABORATORY DATA:  His chest x-ray shows bilateral pulmonary edema.  Sodium  140, potassium 2.8, chloride 108, bicarbonate 24, BUN 12, creatinine 1.5,  glucose 126.  Calcium 8.6, albumin 3.3, total bilirubin 1.6.  The rest of  his liver function tests are normal.  CPK and MB are 50 and less than 1  respectively, with a troponin I of less than 0.05.  Beta natriuretic peptide  is 964.  Two more sets of cardiac markers were checked in the emergency  room, and these were found to be normal as well.  His urinalysis is found to  be completely negative.  His urine drug screen is noted to be positive for  THC.  His white count is 4.8, H&H 13.2 and 38.7 respectively, MCV 91,  platelets count 295, with a 58% shift.   ASSESSMENT/PLAN:  1. Newly-diagnosed congestive heart failure:  The patient is poorly     compliant.  Not on any medicines and he does not see a doctor.  Will     diurese and check a 2-D echocardiogram, TSH and cardiac enzymes.  I have     asked cardiology, Dr. Acie Fredrickson, to see him.  2. Hypokalemia:  This was replaced in the emergency room.  Will watch for     further drops while being on diuretics, and have given him 20 mEq of     potassium daily.  3. Hypertensive urgency:  The patient is unsure of how long he has had     hypertension.  He is not on any medications.  Will start with Lasix and     Mavik.  4. Tetrahydrocannabinol abuse.  5. Tobacco abuse:  The patient recently quit.                                               Annita Brod, M.D.    SKK/MEDQ  D:  09/15/2003  T:  09/15/2003  Job:  [id number]   cc:   Thayer Headings, M.D.  [phone]  [address]  [place] [address]  Fax: [phone]

## 2010-08-11 ENCOUNTER — Ambulatory Visit (INDEPENDENT_AMBULATORY_CARE_PROVIDER_SITE_OTHER): Payer: Self-pay | Admitting: Internal Medicine

## 2010-08-11 ENCOUNTER — Encounter: Payer: Self-pay | Admitting: Internal Medicine

## 2010-08-11 DIAGNOSIS — E119 Type 2 diabetes mellitus without complications: Secondary | ICD-10-CM

## 2010-08-11 DIAGNOSIS — I10 Essential (primary) hypertension: Secondary | ICD-10-CM

## 2010-08-11 NOTE — Patient Instructions (Signed)
Please make a followup appointment in 2 weeks for glucose and blood pressure check and further alterations in management. Please increase the dose of insulin to 20 units in the morning and at night instead of 10 and 15 units. Also continue taking all medications regularly. As we talked try to drink diet soda every time will drink soda and also try to take low-calorie diet and do exercise regularly. Try to walk at least 30 minutes every day for 5 days a week which will help to control her diabetes and blood pressure.

## 2010-08-11 NOTE — Assessment & Plan Note (Addendum)
Blood pressure 151/118 today. Better than last visit. Advised to Continue taking all medications regularly and follow low salt diet and regular exercise.

## 2010-08-11 NOTE — Assessment & Plan Note (Addendum)
CBG 465. Still uncontrolled and last HbA1c was in 14. Will increase the dose of insulin to 20 units twice a day and follow him up in 2 weeks. He drinks regular soda daily and counseled him extensively to drink diet soda instead of regular soda and also to follow a low-calorie diet and exercise-walk at least 30 minutes a day for 5 days a week.

## 2010-08-11 NOTE — Progress Notes (Signed)
  Subjective:    Patient ID: Travis Bryan, male    DOB: May 24, 1970, 40 y.o.   MRN: [id number]  HPI Travis Bryan is a pleasant 40 year old man with past medical history of DM type II, hypertension comes to the clinic for followup visit for these problems. I saw him 2 weeks before and at that time he did not take any medications for his above problems for about 5 months. I started him back on his diabetes medications namely metformin thousand twice a day, Novolin 70/30 10 in a.m. and 15 in p.m. His CBG today is 460s. And he says that he is taking all his medications regularly. So his blood pressure was severely uncontrolled during last visit and didn't take any medications for 5 months for that to. Started him back on his regular blood pressure pills. His BMP within normal limits and his blood pressure today is 151 or 118 which is much better than the last visit. He denies any symptoms for now namely chest pain, shortness of breath, nausea, vomiting, headache, cough, fever, chills, urinary abnormalities, abdominal pain.    Review of Systems    as per history of present illness Objective:   Physical Exam    Constitutional: Vital signs reviewed.  Patient is a well-developed and well-nourished in no acute distress and cooperative with exam. Alert and oriented x3.  Head: Normocephalic and atraumatic Mouth: no erythema or exudates, MMM Eyes: PERRL, EOMI, conjunctivae normal, No scleral icterus.  Neck: Supple, Trachea midline normal ROM, No JVD, mass, thyromegaly, or carotid bruit present.  Cardiovascular: RRR, S1 normal, S2 normal, no MRG. Pulmonary/Chest: CTAB, no wheezes, rales, or rhonchi Abdominal: Soft. Non-tender, non-distended, bowel sounds are normal, no masses, organomegaly, or guarding present.  Musculoskeletal: No joint deformities, erythema, or stiffness, ROM full and no nontender Neurological: A&O x3, Strenght is normal and symmetric bilaterally, cranial nerve II-XII are grossly intact,  no focal motor deficit, sensory intact to light touch bilaterally.  Skin: Warm, dry and intact. No rash, cyanosis, or clubbing.      Assessment & Plan:

## 2010-08-22 ENCOUNTER — Encounter: Payer: Self-pay | Admitting: Internal Medicine

## 2010-09-13 ENCOUNTER — Encounter: Payer: Self-pay | Admitting: Internal Medicine

## 2010-11-29 ENCOUNTER — Emergency Department (HOSPITAL_COMMUNITY)
Admission: EM | Admit: 2010-11-29 | Discharge: 2010-11-29 | Disposition: A | Discharge status: Home / Self Care | Payer: No Typology Code available for payment source | Attending: Emergency Medicine | Admitting: Emergency Medicine

## 2010-11-29 DIAGNOSIS — I10 Essential (primary) hypertension: Secondary | ICD-10-CM | POA: Insufficient documentation

## 2010-11-29 DIAGNOSIS — S20219A Contusion of unspecified front wall of thorax, initial encounter: Secondary | ICD-10-CM | POA: Insufficient documentation

## 2010-11-29 DIAGNOSIS — IMO0002 Reserved for concepts with insufficient information to code with codable children: Secondary | ICD-10-CM | POA: Insufficient documentation

## 2010-11-29 DIAGNOSIS — M79609 Pain in unspecified limb: Secondary | ICD-10-CM | POA: Insufficient documentation

## 2010-11-29 DIAGNOSIS — E119 Type 2 diabetes mellitus without complications: Secondary | ICD-10-CM | POA: Insufficient documentation

## 2010-11-29 DIAGNOSIS — R079 Chest pain, unspecified: Secondary | ICD-10-CM | POA: Insufficient documentation

## 2010-11-29 DIAGNOSIS — Z794 Long term (current) use of insulin: Secondary | ICD-10-CM | POA: Insufficient documentation

## 2010-11-29 DIAGNOSIS — Z79899 Other long term (current) drug therapy: Secondary | ICD-10-CM | POA: Insufficient documentation

## 2010-11-29 DIAGNOSIS — I509 Heart failure, unspecified: Secondary | ICD-10-CM | POA: Insufficient documentation

## 2010-12-12 LAB — GLUCOSE, CAPILLARY: Glucose-Capillary: 198 — ABNORMAL HIGH

## 2010-12-16 LAB — GLUCOSE, CAPILLARY: Glucose-Capillary: 466 mg/dL — ABNORMAL HIGH (ref 70–99)

## 2010-12-22 LAB — URINALYSIS, ROUTINE W REFLEX MICROSCOPIC
Bilirubin Urine: NEGATIVE
Hgb urine dipstick: NEGATIVE
Ketones, ur: NEGATIVE
Protein, ur: 30 — AB
Urobilinogen, UA: 0.2

## 2010-12-22 LAB — BILIRUBIN, FRACTIONATED(TOT/DIR/INDIR)
Bilirubin, Direct: 0.4 — ABNORMAL HIGH
Indirect Bilirubin: 2.2 — ABNORMAL HIGH
Total Bilirubin: 2.6 — ABNORMAL HIGH

## 2010-12-22 LAB — BASIC METABOLIC PANEL
Chloride: 109
GFR calc non Af Amer: 47 — ABNORMAL LOW
Potassium: 3.3 — ABNORMAL LOW
Sodium: 142

## 2010-12-22 LAB — CBC
HCT: 37.4 — ABNORMAL LOW
HCT: 37.8 — ABNORMAL LOW
Hemoglobin: 12.5 — ABNORMAL LOW
Hemoglobin: 12.6 — ABNORMAL LOW
Hemoglobin: 12.6 — ABNORMAL LOW
MCHC: 33.3
MCHC: 33.4
MCV: 87.3
MCV: 87.4
Platelets: 281
Platelets: 287
Platelets: 294
RBC: 4.29
RBC: 4.33
RDW: 13.9
RDW: 14.1 — ABNORMAL HIGH
RDW: 14.1 — ABNORMAL HIGH
WBC: 5.8
WBC: 6.2

## 2010-12-22 LAB — COMPREHENSIVE METABOLIC PANEL
ALT: 17
ALT: 19
AST: 21
Albumin: 3.2 — ABNORMAL LOW
Albumin: 3.4 — ABNORMAL LOW
Alkaline Phosphatase: 75
Alkaline Phosphatase: 76
BUN: 18
CO2: 25
Calcium: 9.2
Chloride: 108
Creatinine, Ser: 1.64 — ABNORMAL HIGH
GFR calc Af Amer: 55 — ABNORMAL LOW
GFR calc Af Amer: 58 — ABNORMAL LOW
GFR calc non Af Amer: 48 — ABNORMAL LOW
Glucose, Bld: 95
Potassium: 3.1 — ABNORMAL LOW
Potassium: 3.6
Sodium: 140
Sodium: 140
Total Bilirubin: 2.5 — ABNORMAL HIGH
Total Protein: 6.9
Total Protein: 7

## 2010-12-22 LAB — RETICULOCYTES: RBC.: 4.38

## 2010-12-22 LAB — POCT CARDIAC MARKERS
CKMB, poc: 1.4
Myoglobin, poc: 114
Operator id: 270651
Troponin i, poc: 0.11 — ABNORMAL HIGH

## 2010-12-22 LAB — DIFFERENTIAL
Basophils Absolute: 0
Basophils Relative: 0
Basophils Relative: 0
Eosinophils Absolute: 0.1
Eosinophils Absolute: 0.3
Eosinophils Relative: 4
Lymphocytes Relative: 35
Lymphs Abs: 2.2
Monocytes Absolute: 0.3
Monocytes Absolute: 0.3
Monocytes Relative: 4
Monocytes Relative: 4
Neutro Abs: 3.5
Neutrophils Relative %: 56

## 2010-12-22 LAB — TSH: TSH: 2.321

## 2010-12-22 LAB — ETHANOL: Alcohol, Ethyl (B): <5

## 2010-12-22 LAB — IRON AND TIBC
Iron: 56
Saturation Ratios: 18 — ABNORMAL LOW
TIBC: 314
UIBC: 258

## 2010-12-22 LAB — PROTIME-INR
INR: 1.2
Prothrombin Time: 15.1

## 2010-12-22 LAB — CARDIAC PANEL(CRET KIN+CKTOT+MB+TROPI)
CK, MB: 2.1
CK, MB: 2.3
CK, MB: 2.6
CK, MB: 2.8
Relative Index: 1.5
Relative Index: 1.6
Relative Index: 1.6
Relative Index: 1.9
Total CK: 129
Total CK: 153
Total CK: 170
Troponin I: 0.14 — ABNORMAL HIGH
Troponin I: 0.14 — ABNORMAL HIGH
Troponin I: 0.18 — ABNORMAL HIGH

## 2010-12-22 LAB — CK TOTAL AND CKMB (NOT AT ARMC)
CK, MB: 2.2
Relative Index: 1.1
Total CK: 196

## 2010-12-22 LAB — LIPID PANEL
Cholesterol: 139
HDL: 24 — ABNORMAL LOW
LDL Cholesterol: 96
Total CHOL/HDL Ratio: 5.8
Triglycerides: 93
VLDL: 19

## 2010-12-22 LAB — CREATININE, URINE, RANDOM: Creatinine, Urine: 82.4

## 2010-12-22 LAB — HIV ANTIBODY (ROUTINE TESTING W REFLEX): HIV: NONREACTIVE

## 2010-12-22 LAB — B-NATRIURETIC PEPTIDE (CONVERTED LAB): Pro B Natriuretic peptide (BNP): 1596 — ABNORMAL HIGH

## 2010-12-22 LAB — VITAMIN B12: Vitamin B-12: 419 (ref 211–911)

## 2010-12-22 LAB — TROPONIN I: Troponin I: 0.15 — ABNORMAL HIGH

## 2010-12-22 LAB — MICROALBUMIN, URINE: Microalb, Ur: 13.5 — ABNORMAL HIGH

## 2010-12-22 LAB — FERRITIN: Ferritin: 65 (ref 22–322)

## 2010-12-22 LAB — RAPID URINE DRUG SCREEN, HOSP PERFORMED
Barbiturates: NOT DETECTED
Benzodiazepines: NOT DETECTED

## 2010-12-22 LAB — FOLATE: Folate: 16.7

## 2010-12-22 LAB — PHOSPHORUS: Phosphorus: 3.3

## 2010-12-22 LAB — URINE MICROSCOPIC-ADD ON

## 2010-12-22 LAB — HAPTOGLOBIN: Haptoglobin: 178

## 2010-12-22 LAB — LACTATE DEHYDROGENASE: LDH: 241

## 2011-03-05 ENCOUNTER — Other Ambulatory Visit: Payer: Self-pay | Admitting: Internal Medicine

## 2011-03-09 ENCOUNTER — Other Ambulatory Visit: Payer: Self-pay | Admitting: Internal Medicine

## 2011-03-24 ENCOUNTER — Encounter: Payer: Self-pay | Admitting: Dietician

## 2011-05-10 ENCOUNTER — Other Ambulatory Visit: Payer: Self-pay | Admitting: Internal Medicine

## 2011-06-01 ENCOUNTER — Telehealth: Payer: Self-pay | Admitting: Dietician

## 2011-06-01 NOTE — Telephone Encounter (Signed)
In basket message form front office:Phone number on file was non working [phone] and sent him a letter to last address we have on file on 05-03-11.

## 2011-06-07 ENCOUNTER — Other Ambulatory Visit: Payer: Self-pay | Admitting: Internal Medicine

## 2011-06-08 ENCOUNTER — Other Ambulatory Visit: Payer: Self-pay

## 2011-06-08 MED ORDER — FUROSEMIDE 80 MG PO TABS: 80.0000 mg | ORAL_TABLET | Freq: Every day | ORAL | Status: DC

## 2011-08-01 ENCOUNTER — Other Ambulatory Visit: Payer: Self-pay | Admitting: Internal Medicine

## 2011-08-04 ENCOUNTER — Telehealth: Payer: Self-pay

## 2011-08-04 ENCOUNTER — Other Ambulatory Visit: Payer: Self-pay | Admitting: Internal Medicine

## 2011-08-04 MED ORDER — FUROSEMIDE 80 MG PO TABS: 80.0000 mg | ORAL_TABLET | Freq: Every day | ORAL | Status: DC

## 2011-08-04 NOTE — Telephone Encounter (Signed)
..   Requested Prescriptions   Signed Prescriptions Disp Refills  . furosemide (LASIX) 80 MG tablet 30 tablet 2    Sig: Take 1 tablet (80 mg total) by mouth daily.    Authorizing Provider: Evans Lance    Ordering User: Ardis Hughs, Yoshi Mancillas Jerilynn Mages

## 2011-08-04 NOTE — Telephone Encounter (Signed)
Called Patient about his refill on his lasix 80 mg , give him 30 tabs with 2 refills. He has appointment in August. I explained to him that he needed to make this appointment to get addition refills onthis med

## 2011-08-04 NOTE — Telephone Encounter (Signed)
Refill- Lasix  Verified [facility] as [facility], patient ordered from correct pharmacy 5/17 but pharmacy does not have refill for him.  Please research refill and call patient at [phone] to advise

## 2011-09-29 ENCOUNTER — Other Ambulatory Visit: Payer: Self-pay | Admitting: Internal Medicine

## 2011-10-20 ENCOUNTER — Telehealth: Payer: Self-pay | Admitting: Internal Medicine

## 2011-10-20 ENCOUNTER — Other Ambulatory Visit: Payer: Self-pay | Admitting: Internal Medicine

## 2011-10-20 DIAGNOSIS — I10 Essential (primary) hypertension: Secondary | ICD-10-CM

## 2011-10-20 MED ORDER — FUROSEMIDE 40 MG PO TABS: 40.0000 mg | ORAL_TABLET | Freq: Two times a day (BID) | ORAL | Status: DC

## 2011-10-20 NOTE — Telephone Encounter (Signed)
Pt calling back to let us know he is out of med and needs asap today, pls call [phone]

## 2011-10-20 NOTE — Telephone Encounter (Signed)
New Problem:    Patient called in because his pharmacy denied refillig his furosemide (LASIX) 40 MG tablet until he had another visit with Dr. Lovena Le.  Patient is scheduled to have another visit on 10/25/11 and would like to know if he could have some to hold him over until his appointment.  Please call back once the order has been placed.

## 2011-10-25 ENCOUNTER — Ambulatory Visit (INDEPENDENT_AMBULATORY_CARE_PROVIDER_SITE_OTHER): Payer: Self-pay | Admitting: Internal Medicine

## 2011-10-25 ENCOUNTER — Encounter: Payer: Self-pay | Admitting: Internal Medicine

## 2011-10-25 VITALS — BP 160/98 | Ht 70.0 in | Wt 206.0 lb

## 2011-10-25 DIAGNOSIS — E119 Type 2 diabetes mellitus without complications: Secondary | ICD-10-CM

## 2011-10-25 DIAGNOSIS — Z95810 Presence of automatic (implantable) cardiac defibrillator: Secondary | ICD-10-CM

## 2011-10-25 DIAGNOSIS — I429 Cardiomyopathy, unspecified: Secondary | ICD-10-CM

## 2011-10-25 DIAGNOSIS — I5022 Chronic systolic (congestive) heart failure: Secondary | ICD-10-CM

## 2011-10-25 DIAGNOSIS — I10 Essential (primary) hypertension: Secondary | ICD-10-CM

## 2011-10-25 LAB — ICD DEVICE OBSERVATION
CHARGE TIME: 0 s
HV IMPEDENCE: 44 Ohm
RV LEAD AMPLITUDE: 11 mv
RV LEAD IMPEDENCE ICD: 345 Ohm
RV LEAD THRESHOLD: 1 V
TOT-0006: 20111206000000
TOT-0009: 1
TOT-0010: 43

## 2011-10-25 MED ORDER — AMLODIPINE BESYLATE 10 MG PO TABS: 10.0000 mg | ORAL_TABLET | Freq: Every day | ORAL | Status: DC

## 2011-10-25 MED ORDER — METFORMIN HCL 1000 MG PO TABS: 1000.0000 mg | ORAL_TABLET | Freq: Two times a day (BID) | ORAL | Status: DC

## 2011-10-25 MED ORDER — LISINOPRIL 40 MG PO TABS: 40.0000 mg | ORAL_TABLET | Freq: Every day | ORAL | Status: DC

## 2011-10-25 MED ORDER — INSULIN NPH ISOPHANE & REGULAR (70-30) 100 UNIT/ML ~~LOC~~ SUSP: 20.0000 [IU] | Freq: Two times a day (BID) | SUBCUTANEOUS | Status: DC

## 2011-10-25 NOTE — Assessment & Plan Note (Signed)
His congestive heart failure symptoms are class II. I've encouraged him to continue his current medical therapy and maintain a low-sodium diet.

## 2011-10-25 NOTE — Progress Notes (Signed)
HPI Travis Bryan returns today for followup. He is a very pleasant 41 year old man with chronic systolic heart failure, hypertension, diabetes, status post ICD implantation. In the interim he has done well. He denies chest pain, shortness of breath, or syncope. No peripheral edema. No Known Allergies   Current Outpatient Prescriptions  Medication Sig Dispense Refill  . amLODipine (NORVASC) 10 MG tablet Take 1 tablet (10 mg total) by mouth daily.  31 tablet  6  . aspirin 325 MG tablet Take 1 tablet (325 mg total) by mouth daily.  31 tablet  11  . carvedilol (COREG) 25 MG tablet TAKE ONE TABLET BY MOUTH TWICE DAILY  60 tablet  0  . furosemide (LASIX) 40 MG tablet Take 1 tablet (40 mg total) by mouth 2 (two) times daily.  30 tablet  0  . furosemide (LASIX) 80 MG tablet TAKE ONE TABLET BY MOUTH EVERY DAY  30 tablet  0  . insulin NPH-insulin regular (NOVOLIN 70/30) (70-30) 100 UNIT/ML injection Inject 20 Units into the skin 2 (two) times daily with a meal.  10 mL  6  . lisinopril (PRINIVIL,ZESTRIL) 40 MG tablet Take 1 tablet (40 mg total) by mouth daily.  30 tablet  6  . metFORMIN (GLUCOPHAGE) 1000 MG tablet Take 1 tablet (1,000 mg total) by mouth 2 (two) times daily with a meal.  60 tablet  6  . DISCONTD: amLODipine (NORVASC) 10 MG tablet Take 1 tablet (10 mg total) by mouth daily.  31 tablet  10  . DISCONTD: insulin NPH-insulin regular (NOVOLIN 70/30) (70-30) 100 UNIT/ML injection Inject 20 Units into the skin 2 (two) times daily with a meal.        . DISCONTD: lisinopril (PRINIVIL,ZESTRIL) 40 MG tablet Take 1 tablet (40 mg total) by mouth daily.  30 tablet  11  . DISCONTD: metFORMIN (GLUCOPHAGE) 1000 MG tablet Take 1 tablet (1,000 mg total) by mouth 2 (two) times daily with a meal.  60 tablet  11     Past Medical History  Diagnosis Date  . Systolic heart failure   . Cardiomyopathy   . ICD (implantable cardiac defibrillator), single, in situ   . Hyperlipidemia   . Hypertension   . Obesity   .  Diabetes mellitus     ROS:   All systems reviewed and negative except as noted in the HPI.   Past Surgical History  Procedure Date  . Icd 04/24/07    [name]. [name] II VR from [place] for nonischemic cardiomyopathy and class II heart failure     No family history on file.   History   Social History  . Marital Status: Married    Spouse Name: N/A    Number of Children: N/A  . Years of Education: N/A   Occupational History  . Not on file.   Social History Main Topics  . Smoking status: Former Smoker    Quit date: 07/28/2002  . Smokeless tobacco: Not on file  . Alcohol Use: No  . Drug Use: No  . Sexually Active: Not on file   Other Topics Concern  . Not on file   Social History Narrative  . No narrative on file     BP 160/98  Ht 5\' 10"  (1.778 m)  Wt 206 lb (93.441 kg)  BMI 29.56 kg/m2  Physical Exam:  Well appearing NAD HEENT: Unremarkable Neck:  No JVD, no thyromegally Lungs:  Clear with no wheezes, rales, or rhonchi. HEART:  Regular rate rhythm,  no murmurs, no rubs, no clicks Abd:  soft, positive bowel sounds, no organomegally, no rebound, no guarding Ext:  2 plus pulses, no edema, no cyanosis, no clubbing Skin:  No rashes no nodules Neuro:  CN II through XII intact, motor grossly intact  DEVICE  Normal device function.  See PaceArt for details.   Assess/Plan:

## 2011-10-25 NOTE — Assessment & Plan Note (Signed)
His device is working normally. He is approaching elective replacement. We'll continue followup in several months.

## 2011-10-25 NOTE — Addendum Note (Signed)
Addended by: Phineas Inches D on: 10/25/2011 10:35 AM   Modules accepted: Orders

## 2011-10-25 NOTE — Patient Instructions (Addendum)
Your physician wants you to follow-up in: 1 year with Dr. Taylor. You will receive a reminder letter in the mail two months in advance. If you don't receive a letter, please call our office to schedule the follow-up appointment.  

## 2011-10-25 NOTE — Assessment & Plan Note (Signed)
His blood pressure is elevated today.

## 2011-11-09 ENCOUNTER — Other Ambulatory Visit: Payer: Self-pay | Admitting: Cardiology

## 2011-12-14 ENCOUNTER — Other Ambulatory Visit: Payer: Self-pay | Admitting: Internal Medicine

## 2012-03-15 ENCOUNTER — Other Ambulatory Visit: Payer: Self-pay | Admitting: Internal Medicine

## 2012-03-16 ENCOUNTER — Other Ambulatory Visit: Payer: Self-pay | Admitting: Internal Medicine

## 2012-03-18 NOTE — Telephone Encounter (Signed)
New Problem:    Patient called in needing a refill of his furosemide (LASIX) 80 MG tablet.

## 2012-03-19 MED ORDER — FUROSEMIDE 80 MG PO TABS: 80.0000 mg | ORAL_TABLET | Freq: Every day | ORAL | Status: DC

## 2012-07-24 ENCOUNTER — Other Ambulatory Visit: Payer: Self-pay | Admitting: Internal Medicine

## 2012-09-19 ENCOUNTER — Other Ambulatory Visit: Payer: Self-pay

## 2012-09-23 ENCOUNTER — Encounter: Payer: Self-pay | Admitting: Internal Medicine

## 2012-10-08 ENCOUNTER — Encounter: Payer: Self-pay | Admitting: Cardiology

## 2012-10-09 ENCOUNTER — Other Ambulatory Visit: Payer: Self-pay | Admitting: Internal Medicine

## 2012-10-24 ENCOUNTER — Encounter: Payer: Self-pay | Admitting: Cardiology

## 2012-11-05 ENCOUNTER — Encounter: Payer: Self-pay | Admitting: Cardiology

## 2012-12-05 ENCOUNTER — Other Ambulatory Visit: Payer: Self-pay | Admitting: Internal Medicine

## 2012-12-10 ENCOUNTER — Encounter: Payer: Self-pay | Admitting: Cardiology

## 2012-12-10 ENCOUNTER — Ambulatory Visit (INDEPENDENT_AMBULATORY_CARE_PROVIDER_SITE_OTHER): Payer: Self-pay | Admitting: Cardiology

## 2012-12-10 VITALS — BP 156/117 | HR 79 | Ht 70.0 in | Wt 212.1 lb

## 2012-12-10 DIAGNOSIS — I5022 Chronic systolic (congestive) heart failure: Secondary | ICD-10-CM

## 2012-12-10 DIAGNOSIS — I10 Essential (primary) hypertension: Secondary | ICD-10-CM

## 2012-12-10 DIAGNOSIS — I4901 Ventricular fibrillation: Secondary | ICD-10-CM

## 2012-12-10 DIAGNOSIS — Z95810 Presence of automatic (implantable) cardiac defibrillator: Secondary | ICD-10-CM

## 2012-12-10 DIAGNOSIS — I428 Other cardiomyopathies: Secondary | ICD-10-CM

## 2012-12-10 LAB — ICD DEVICE OBSERVATION
BATTERY VOLTAGE: 2.53 V
DEVICE MODEL ICD: 400562
PACEART VT: 0
RV LEAD AMPLITUDE: 8.4 mv
VENTRICULAR PACING ICD: 0 pct
VF: 1

## 2012-12-10 NOTE — Patient Instructions (Addendum)
Your physician recommends that you schedule a follow-up appointment in: [place] 2 MONTHS   Your physician recommends that you continue on your current medications as directed. Please refer to the Current Medication list given to you today.

## 2012-12-10 NOTE — Progress Notes (Signed)
ELECTROPHYSIOLOGY OFFICE NOTE  Patient ID: Travis Bryan MRN: [id number], DOB/AGE: 42-Jan-1972   Date of Visit: 12/10/2012  Primary Physician: No primary provider on file. Primary Cardiologist: Cristopher Peru, MD Reason for Visit: EP/device follow-up  History of Present Illness  Travis Bryan is a 42 y.o. male with NICM, chronic systolic HF, HTN, DM, CKD and alcohol abuse who presents today for routine electrophysiology followup. Since last being seen in our clinic, he reports he is doing well and has no complaints. He denies chest pain or shortness of breath. He denies palpitations, dizziness, near syncope or syncope. He denies LE swelling, orthopnea, PND or recent weight gain. He denies ICD shocks. He reports compliance with medications although he has taken his medication today.  Past Medical History Past Medical History  Diagnosis Date  . Systolic heart failure   . Cardiomyopathy   . ICD (implantable cardiac defibrillator), single, in situ   . Hyperlipidemia   . Hypertension   . Obesity   . Diabetes mellitus     Past Surgical History Past Surgical History  Procedure Laterality Date  . Icd  04/24/07    [name]. [name] II VR from [place] for nonischemic cardiomyopathy and class II heart failure    Allergies/Intolerances No Known Allergies  Current Home Medications Current Outpatient Prescriptions  Medication Sig Dispense Refill  . aspirin 325 MG tablet Take 1 tablet (325 mg total) by mouth daily.  31 tablet  11  . carvedilol (COREG) 25 MG tablet TAKE ONE TABLET BY MOUTH TWICE DAILY  60 tablet  6  . furosemide (LASIX) 80 MG tablet Take 1 tablet (80 mg total) by mouth daily.  30 tablet  6  . lisinopril (PRINIVIL,ZESTRIL) 40 MG tablet Take 1 tablet (40 mg total) by mouth daily.  30 tablet  6  . metFORMIN (GLUCOPHAGE) 1000 MG tablet Take 1 tablet (1,000 mg total) by mouth 2 (two) times daily with a meal.  60 tablet  6   No current facility-administered medications for  this visit.    Social History Social History  . Marital Status: Married   Social History Main Topics  . Smoking status: Former Smoker    Quit date: 07/28/2002  . Smokeless tobacco: No  . Alcohol Use: No  . Drug Use: No   Review of Systems General: No chills, fever, night sweats or weight changes Cardiovascular: No chest pain, dyspnea on exertion, edema, orthopnea, palpitations, paroxysmal nocturnal dyspnea Dermatological: No rash, lesions or masses Respiratory: No cough, dyspnea Urologic: No hematuria, dysuria Abdominal: No nausea, vomiting, diarrhea, bright red blood per rectum, melena, or hematemesis Neurologic: No visual changes, weakness, changes in mental status All other systems reviewed and are otherwise negative except as noted above.  Physical Exam Vitals: Blood pressure 156/117, pulse 79, height 5\' 10"  (1.778 m), weight 212 lb 1.9 oz (96.217 kg).  General: Well developed, well appearing 42 y.o. male in no acute distress. HEENT: Normocephalic, atraumatic. EOMs intact. Sclera nonicteric. Oropharynx clear.  Neck: Supple. No JVD. Lungs: Respirations regular and unlabored, CTA bilaterally. No wheezes, rales or rhonchi. Heart: RRR. S1, S2 present. No murmurs, rub, S3 or S4. Abdomen: Soft, non-distended.   Extremities: No clubbing, cyanosis or edema. PT/Radials 2+ and equal bilaterally. Psych: Normal affect. Neuro: Alert and oriented X 3. Moves all extremities spontaneously.   Diagnostics Device interrogation today - Normal device function. Thresholds and sensing consistent with previous device measurements. Impedance trends stable over time. 1 VF episode on 03/20/2012, 18 seconds in  duration, 20J shock x 1 with dirty break to SR. Otherwise no evidence of any ventricular arrhythmias. Histogram distribution appropriate for patient and level of activity. No changes made this session. Device programmed at appropriate safety margins. Device programmed to optimize intrinsic  conduction. Nearing ERI so will have patitent return to clinic in 2 months.  Assessment and Plan 1. NICM s/p ICD implant - normal device function - 1 VF episode, treated with 20J shock x 1 in Jan 2014 (not reported by Mr. Kaman as he states he was unaware) - no programming changes made - battery nearing ERI so will schedule follow-up device check in 2 months 2. Chronic systolic HF - stable without HF symptoms - continue medical therapy with Coreg and lisinopril - counseled regarding low sodium diet and daily weights 3. HTN  - BP elevated today but he has not taken medications  Signed, Ceaira Ernster, PA-C 12/10/2012, 9:47 AM

## 2012-12-24 ENCOUNTER — Other Ambulatory Visit: Payer: Self-pay | Admitting: Internal Medicine

## 2013-01-01 ENCOUNTER — Encounter: Payer: Self-pay | Admitting: Internal Medicine

## 2013-01-31 ENCOUNTER — Other Ambulatory Visit: Payer: Self-pay | Admitting: Internal Medicine

## 2013-02-11 ENCOUNTER — Encounter: Payer: Self-pay | Admitting: Cardiology

## 2013-02-17 ENCOUNTER — Encounter: Payer: Self-pay | Admitting: Internal Medicine

## 2013-02-21 ENCOUNTER — Other Ambulatory Visit: Payer: Self-pay | Admitting: Internal Medicine

## 2013-03-19 ENCOUNTER — Other Ambulatory Visit: Payer: Self-pay | Admitting: Internal Medicine

## 2013-03-21 ENCOUNTER — Other Ambulatory Visit: Payer: Self-pay | Admitting: *Deleted

## 2013-03-21 DIAGNOSIS — I10 Essential (primary) hypertension: Secondary | ICD-10-CM

## 2013-03-21 MED ORDER — LISINOPRIL 40 MG PO TABS: 40.0000 mg | ORAL_TABLET | Freq: Every day | ORAL | Status: DC

## 2013-03-25 ENCOUNTER — Encounter: Payer: Self-pay | Admitting: Cardiology

## 2013-04-09 ENCOUNTER — Other Ambulatory Visit: Payer: Self-pay | Admitting: Internal Medicine

## 2013-04-15 ENCOUNTER — Ambulatory Visit (INDEPENDENT_AMBULATORY_CARE_PROVIDER_SITE_OTHER): Payer: Self-pay | Admitting: Cardiology

## 2013-04-15 ENCOUNTER — Other Ambulatory Visit: Payer: Self-pay | Admitting: *Deleted

## 2013-04-15 ENCOUNTER — Encounter: Payer: Self-pay | Admitting: Cardiology

## 2013-04-15 VITALS — BP 162/110 | HR 82 | Wt 227.0 lb

## 2013-04-15 DIAGNOSIS — Z95810 Presence of automatic (implantable) cardiac defibrillator: Secondary | ICD-10-CM

## 2013-04-15 DIAGNOSIS — I428 Other cardiomyopathies: Secondary | ICD-10-CM

## 2013-04-15 DIAGNOSIS — I10 Essential (primary) hypertension: Secondary | ICD-10-CM

## 2013-04-15 DIAGNOSIS — I5022 Chronic systolic (congestive) heart failure: Secondary | ICD-10-CM

## 2013-04-15 DIAGNOSIS — Z4502 Encounter for adjustment and management of automatic implantable cardiac defibrillator: Secondary | ICD-10-CM

## 2013-04-15 LAB — MDC_IDC_ENUM_SESS_TYPE_INCLINIC
Battery Voltage: 2.5 V
Brady Statistic RV Percent Paced: 1 % — CL
Implantable Pulse Generator Serial Number: 400562
Lead Channel Pacing Threshold Amplitude: 0.75 V
Lead Channel Pacing Threshold Pulse Width: 0.5 ms
Lead Channel Setting Pacing Amplitude: 2.5 V
Lead Channel Setting Pacing Pulse Width: 0.5 ms
Lead Channel Setting Sensing Sensitivity: 0.3 mV
MDC IDC MSMT LEADCHNL RV IMPEDANCE VALUE: 320 Ohm
MDC IDC MSMT LEADCHNL RV SENSING INTR AMPL: 10.1 mV
MDC IDC SESS DTM: 20150203143244
MDC IDC SET ZONE DETECTION INTERVAL: 300 ms

## 2013-04-15 MED ORDER — FUROSEMIDE 80 MG PO TABS: ORAL_TABLET | ORAL | Status: DC

## 2013-04-15 MED ORDER — LISINOPRIL 40 MG PO TABS
ORAL_TABLET | ORAL | Status: DC
Start: 2013-04-15 — End: 2013-08-03

## 2013-04-15 MED ORDER — CARVEDILOL 25 MG PO TABS: ORAL_TABLET | ORAL | Status: DC

## 2013-04-15 NOTE — Progress Notes (Signed)
Patient ID: Travis Bryan MRN: [id number], DOB/AGE: [birth year]   Date of Visit: 04/15/2013  Primary Physician: No primary provider on file. Primary Cardiologist: Cristopher Peru, MD  Reason for Visit: EP/device follow-up   History of Present Illness  Travis Bryan is a 43 y.o. male with NICM, chronic systolic HF, HTN, DM, CKD and alcohol abuse who presents today for routine electrophysiology followup. His ICD battery is nearing ERI.   Since last being seen in our clinic, he reports he is doing well and has no complaints. He denies chest pain or shortness of breath. He denies palpitations, dizziness, near syncope or syncope. He denies LE swelling, orthopnea, PND or recent weight gain. He denies ICD shocks. He reports compliance with medications although he has taken his medication this morning. He does admit to eating salty foods, specifically chips.  Past Medical History Past Medical History  Diagnosis Date  . Systolic heart failure   . Cardiomyopathy   . ICD (implantable cardiac defibrillator), single, in situ   . Hyperlipidemia   . Hypertension   . Obesity   . Diabetes mellitus     Past Surgical History Past Surgical History  Procedure Laterality Date  . Icd  04/24/07    [name]. [name] II VR from [place] for nonischemic cardiomyopathy and class II heart failure    Allergies/Intolerances No Known Allergies Current Home Medications Current Outpatient Prescriptions  Medication Sig Dispense Refill  . carvedilol (COREG) 25 MG tablet TAKE ONE TABLET BY MOUTH TWICE DAILY  60 tablet  0  . furosemide (LASIX) 80 MG tablet TAKE ONE TABLET BY MOUTH ONCE DAILY  30 tablet  0  . lisinopril (PRINIVIL,ZESTRIL) 40 MG tablet TAKE ONE TABLET BY MOUTH ONCE BID      . metFORMIN (GLUCOPHAGE) 1000 MG tablet Take 1 tablet (1,000 mg total) by mouth 2 (two) times daily with a meal.  60 tablet  6   No current facility-administered medications for this visit.   Social History History    Social History  . Marital Status: Married    Spouse Name: N/A    Number of Children: N/A  . Years of Education: N/A   Occupational History  . Not on file.   Social History Main Topics  . Smoking status: Former Smoker    Quit date: 07/28/2002  . Smokeless tobacco: Not on file  . Alcohol Use: No  . Drug Use: No  . Sexual Activity: Not on file   Other Topics Concern  . Not on file   Social History Narrative  . No narrative on file     Review of Systems General: No chills, fever, night sweats or weight changes Cardiovascular: No chest pain, dyspnea on exertion, edema, orthopnea, palpitations, paroxysmal nocturnal dyspnea Dermatological: No rash, lesions or masses Respiratory: No cough, dyspnea Urologic: No hematuria, dysuria Abdominal: No nausea, vomiting, diarrhea, bright red blood per rectum, melena, or hematemesis Neurologic: No visual changes, weakness, changes in mental status All other systems reviewed and are otherwise negative except as noted above.  Physical Exam Vitals: Blood pressure 162/110, pulse 82, weight 227 lb (102.967 kg).  General: Well developed, well appearing 43 y.o. male in no acute distress. HEENT: Normocephalic, atraumatic. EOMs intact. Sclera nonicteric. Oropharynx clear.  Neck: Supple without bruits. No JVD. Lungs: Respirations regular and unlabored, CTA bilaterally. No wheezes, rales or rhonchi. Heart: RRR. S1, S2 present. No murmurs, rub, S3 or S4. Abdomen: Soft, non-tender, non-distended. BS present x 4 quadrants. No hepatosplenomegaly.  Extremities:  No clubbing, cyanosis or edema. DP/PT/Radials 2+ and equal bilaterally. Psych: Normal affect. Neuro: Alert and oriented X 3. Moves all extremities spontaneously.   Diagnostics Device interrogation today - Normal device function. Threshold and sensing consistent with previous device measurements. Impedance trends stable over time. No VT/VF episodes. No changes made this session. Device  programmed at appropriate safety margins. Device programmed to optimize intrinsic conduction. Nearing ERI so will have patitent return to clinic in 6 weeks.   Assessment and Plan  1. NICM s/p ICD implant  - normal device function  - no VT/VF episodes - no programming changes made  - battery nearing ERI so will schedule follow-up device check in 6 weeks 2. Chronic systolic HF  - stable without HF symptoms  - continue medical therapy with Coreg and lisinopril  - counseled regarding low sodium diet and daily weights  3. HTN  - BP elevated today but he has not taken medications (he states he usually takes his AM meds at 10:00) - counseled regarding compliance with medications and low sodium diet   Signed, Teola Felipe, PA-C 04/15/2013, 9:43 AM

## 2013-04-15 NOTE — Patient Instructions (Addendum)
Your physician recommends that you keep your schedule follow-up appointment on March 17 th at 8:30am with Travis Bryan  Your physician recommends that you continue on your current medications as directed. Please refer to the Current Medication list given to you today.

## 2013-05-12 ENCOUNTER — Encounter: Payer: Self-pay | Admitting: Cardiology

## 2013-05-19 ENCOUNTER — Encounter: Payer: Self-pay | Admitting: Internal Medicine

## 2013-05-27 ENCOUNTER — Encounter: Payer: Self-pay | Admitting: Cardiology

## 2013-06-10 ENCOUNTER — Encounter: Payer: Self-pay | Admitting: Cardiology

## 2013-06-11 ENCOUNTER — Encounter: Payer: Self-pay | Admitting: Cardiology

## 2013-07-02 ENCOUNTER — Other Ambulatory Visit: Payer: Self-pay | Admitting: *Deleted

## 2013-07-02 MED ORDER — FUROSEMIDE 80 MG PO TABS: ORAL_TABLET | ORAL | Status: DC

## 2013-07-23 ENCOUNTER — Encounter: Payer: Self-pay | Admitting: Internal Medicine

## 2013-07-23 ENCOUNTER — Ambulatory Visit (INDEPENDENT_AMBULATORY_CARE_PROVIDER_SITE_OTHER): Payer: Self-pay | Admitting: Internal Medicine

## 2013-07-23 VITALS — BP 160/100 | HR 79 | Ht 70.0 in | Wt 223.0 lb

## 2013-07-23 DIAGNOSIS — Z4502 Encounter for adjustment and management of automatic implantable cardiac defibrillator: Secondary | ICD-10-CM

## 2013-07-23 DIAGNOSIS — Z95810 Presence of automatic (implantable) cardiac defibrillator: Secondary | ICD-10-CM

## 2013-07-23 DIAGNOSIS — I428 Other cardiomyopathies: Secondary | ICD-10-CM

## 2013-07-23 LAB — MDC_IDC_ENUM_SESS_TYPE_INCLINIC
Lead Channel Sensing Intrinsic Amplitude: 8.4 mV
Lead Channel Setting Pacing Amplitude: 2.5 V
Lead Channel Setting Pacing Pulse Width: 0.5 ms
MDC IDC MSMT LEADCHNL RV IMPEDANCE VALUE: 320 Ohm
MDC IDC PG SERIAL: 400562
MDC IDC SET LEADCHNL RV SENSING SENSITIVITY: 0.3 mV
MDC IDC STAT BRADY RV PERCENT PACED: 1 % — AB
Zone Setting Detection Interval: 300 ms

## 2013-07-23 MED ORDER — FUROSEMIDE 80 MG PO TABS: ORAL_TABLET | ORAL | Status: DC

## 2013-07-23 NOTE — Assessment & Plan Note (Signed)
His symptoms have remained class 2. He will continue his current meds. Will repeat 2D echo to see if his EF has worsened or improved. Previously EF was 15%. No change in meds.

## 2013-07-23 NOTE — Progress Notes (Signed)
HPI [name] returns today for followup. He is a very pleasant 43 year old man with chronic systolic heart failure, hypertension, diabetes, status post ICD implantation. In the interim he has done well. He denies chest pain, shortness of breath, or syncope. No peripheral edema. He has reached ERI. He has not had an echo in 7 years. His heart failure is class 2.   No Known Allergies   Current Outpatient Prescriptions  Medication Sig Dispense Refill  . carvedilol (COREG) 25 MG tablet TAKE ONE TABLET BY MOUTH TWICE DAILY  60 tablet  0  . furosemide (LASIX) 80 MG tablet TAKE ONE TABLET BY MOUTH ONCE DAILY  30 tablet  0  . lisinopril (PRINIVIL,ZESTRIL) 40 MG tablet TAKE ONE TABLET BY MOUTH ONCE BID  60 tablet  3  . metFORMIN (GLUCOPHAGE) 1000 MG tablet Take 1 tablet (1,000 mg total) by mouth 2 (two) times daily with a meal.  60 tablet  6   No current facility-administered medications for this visit.     Past Medical History  Diagnosis Date  . Systolic heart failure   . Cardiomyopathy   . ICD (implantable cardiac defibrillator), single, in situ   . Hyperlipidemia   . Hypertension   . Obesity   . Diabetes mellitus     ROS:   All systems reviewed and negative except as noted in the HPI.   Past Surgical History  Procedure Laterality Date  . Icd  04/24/07    [name]. [name] II VR from [place] for nonischemic cardiomyopathy and class II heart failure     No family history on file.   History   Social History  . Marital Status: Married    Spouse Name: N/A    Number of Children: N/A  . Years of Education: N/A   Occupational History  . Not on file.   Social History Main Topics  . Smoking status: Former Smoker    Quit date: 07/28/2002  . Smokeless tobacco: Not on file  . Alcohol Use: No  . Drug Use: No  . Sexual Activity: Not on file   Other Topics Concern  . Not on file   Social History Narrative  . No narrative on file     BP 160/100  Pulse 79  Ht 5'  10" (1.778 m)  Wt 223 lb (101.152 kg)  BMI 32.00 kg/m2  Physical Exam:  Well appearing NAD HEENT: Unremarkable except for very poor dentition Neck:  No JVD, no thyromegally Lungs:  Clear with no wheezes, rales, or rhonchi. HEART:  Regular rate rhythm, no murmurs, no rubs, no clicks Abd:  soft, positive bowel sounds, no organomegally, no rebound, no guarding Ext:  2 plus pulses, no edema, no cyanosis, no clubbing Skin:  No rashes no nodules Neuro:  CN II through XII intact, motor grossly intact  DEVICE  Normal device function.  See PaceArt for details. Device is at [facility]  Assess/Plan:

## 2013-07-23 NOTE — Assessment & Plan Note (Signed)
His device has reached ERI. He will undergo 2D echo. Will schedule generator change out if his EF is less than or equal to 35%.

## 2013-07-23 NOTE — Patient Instructions (Addendum)
Your physician has requested that you have an echocardiogram. Echocardiography is a painless test that uses sound waves to create images of your heart. It provides your doctor with information about the size and shape of your heart and how well your heart's chambers and valves are working. This procedure takes approximately one hour. There are no restrictions for this procedure.   Will call after echo to schedule the battery replacement

## 2013-07-28 ENCOUNTER — Encounter: Payer: Self-pay | Admitting: Internal Medicine

## 2013-08-03 ENCOUNTER — Encounter (HOSPITAL_COMMUNITY): Payer: Self-pay | Admitting: Emergency Medicine

## 2013-08-03 ENCOUNTER — Emergency Department (HOSPITAL_COMMUNITY)
Admission: EM | Admit: 2013-08-03 | Discharge: 2013-08-03 | Disposition: A | Discharge status: Home / Self Care | Payer: Self-pay | Attending: Emergency Medicine | Admitting: Emergency Medicine

## 2013-08-03 DIAGNOSIS — Z4502 Encounter for adjustment and management of automatic implantable cardiac defibrillator: Secondary | ICD-10-CM | POA: Insufficient documentation

## 2013-08-03 DIAGNOSIS — I5020 Unspecified systolic (congestive) heart failure: Secondary | ICD-10-CM | POA: Insufficient documentation

## 2013-08-03 DIAGNOSIS — Z79899 Other long term (current) drug therapy: Secondary | ICD-10-CM | POA: Insufficient documentation

## 2013-08-03 DIAGNOSIS — Z87891 Personal history of nicotine dependence: Secondary | ICD-10-CM | POA: Insufficient documentation

## 2013-08-03 DIAGNOSIS — E669 Obesity, unspecified: Secondary | ICD-10-CM | POA: Insufficient documentation

## 2013-08-03 DIAGNOSIS — I10 Essential (primary) hypertension: Secondary | ICD-10-CM | POA: Insufficient documentation

## 2013-08-03 DIAGNOSIS — E119 Type 2 diabetes mellitus without complications: Secondary | ICD-10-CM | POA: Insufficient documentation

## 2013-08-03 DIAGNOSIS — Z95810 Presence of automatic (implantable) cardiac defibrillator: Secondary | ICD-10-CM

## 2013-08-03 NOTE — ED Notes (Signed)
He states he felt his defibrillator "vibrate" this morning. He thinks his batteries might be low. He denies any complaints and states he "feels fine."

## 2013-08-03 NOTE — Discharge Instructions (Signed)
Cardioverter Defibrillator Implantation  An implantable cardioverter defibrillator (ICD) is a small, lightweight, battery-powered device that is placed (implanted) under the skin in the chest or abdomen. Your caregiver may prescribe an ICD if:  · You have had an irregular heart rhythm (arrhythmia) that originated in the lower chambers of the heart (ventricles).  · Your heart has been damaged by a disease (such as coronary artery disease) or heart condition (such as a heart attack).  An ICD consists of a battery that lasts several years, a small computer called a pulse generator, and wires called leads that go into the heart. It is used to detect and correct two dangerous arrhythmias: a rapid heart rhythm (tachycardia) and an arrhythmia in which the ventricles contract in an uncoordinated way (fibrillation). When an ICD detects tachycardia, it sends an electrical signal to the heart that restores the heartbeat to normal (cardioversion). This signal is usually painless. If cardioversion does not work or if the ICD detects fibrillation, it delivers a small electrical shock to the heart (defibrillation) to restart the heart. The shock may feel like a strong jolt in the chest. ICDs may be programmed to correct other problems. Sometimes, ICDs are programmed to act as another type of implantable device called a pacemaker. Pacemakers are used to treat a slow heartbeat (bradycardia).  LET YOUR CAREGIVER KNOW ABOUT:  · Any allergies you have.  · All medicines you are taking, including vitamins, herbs, eyedrops, and over-the-counter medicines and creams.  · Previous problems you or members of your family have had with the use of anesthetics.  · Any blood disorders you have had.  · Other health problems you have.  RISKS AND COMPLICATIONS  Generally, the procedure to implant an ICD is safe. However, as with any surgical procedure, complications can occur. Possible complications associated with implanting an ICD  include:  · Swelling, bleeding, or bruising at the site where the ICD was implanted.  · Infection at the site where the ICD was implanted.  · A reaction to medicine used during the procedure.  · Nerve, heart, or blood vessel damage.  · Blood clots.  BEFORE THE PROCEDURE  · You may need to have blood tests, heart tests, or a chest X-ray done before the day of the procedure.  · Ask your caregiver about changing or stopping your regular medicines.  · Make plans to have someone drive you home. You may need to stay in the hospital overnight after the procedure.  · Stop smoking at least 24 hours before the procedure.  · Take a bath or shower the night before the procedure. You may need to scrub your chest or abdomen with a special type of soap.  · Do not eat or drink before your procedure for as long as directed by your caregiver. Ask if it is okay to take any needed medicine with a small sip of water.  PROCEDURE   The procedure to implant an ICD in your chest or abdomen is usually done at a hospital in a room that has a large X-ray machine called a fluoroscope. The machine will be above you during the procedure. It will help your caregiver see your heart during the procedure. Implanting an ICD usually takes 1 3 hours. Before the procedure:   · Small monitors will be put on your body. They will be used to check your heart, blood pressure, and oxygen level.  · A needle will be put into a vein in your hand or arm. This is   called an intravenous (IV) access tube. Fluids and medicine will flow directly into your body through the IV tube.  · Your chest or abdomen will be cleaned with a germ-killing (antiseptic) solution. The area may be shaved.  · You may be given medicine to help you relax (sedative).  · You will be given a medicine called a local anesthetic. This medicine will make the surgical site numb while the ICD is implanted. You will be sleepy but awake during the procedure.  After you are numb the procedure will  begin. The caregiver will:  · Make a small cut (incision). This will make a pocket deep under your skin that will hold the pulse generator.  · Guide the leads through a large blood vessel into your heart and attach them to the heart muscles. Depending on the ICD, the leads may go into one ventricle or they may go to both ventricles and into an upper chamber of the heart (atrium).  · Test the ICD.  · Close the incision with stitches, glue, or staples.  AFTER THE PROCEDURE  · You may feel pain. Some pain is normal. It may last a few days.  · You may stay in a recovery area until the local anesthetic has worn off. Your blood pressure and pulse will be checked often. You will be taken to a room where your heart will be monitored.  · A chest X-ray will be taken. This is done to check that the cardioverter defibrillator is in the right place.  · You may stay in the hospital overnight.  · A slight bump may be seen over the skin where the ICD was placed. Sometimes, it is possible to feel the ICD under the skin. This is normal.  · In the months and years afterward, your caregiver will check the device, the leads, and the battery every few months. Eventually, when the battery is low, the ICD will be replaced.  Document Released: 11/19/2001 Document Revised: 12/18/2012 Document Reviewed: 03/18/2012  ExitCare® Patient Information ©2014 [employer].

## 2013-08-03 NOTE — ED Provider Notes (Signed)
CSN: [id number]     Arrival date & time 08/03/13  1251 History   First MD Initiated Contact with Patient 08/03/13 1256     Chief Complaint  Patient presents with  . Pacemaker Problem     (Consider location/radiation/quality/duration/timing/severity/associated sxs/prior Treatment) HPI  43yM presenting after AICD vibrated this morning. Happened as was getting in shower. Vibrated for several seconds, stopped for a few seconds and then vibrating again. No complaints otherwise. No CP. No SOB. No fever or chills. No dizziness or lightheadedness.   Past Medical History  Diagnosis Date  . Systolic heart failure   . Cardiomyopathy   . ICD (implantable cardiac defibrillator), single, in situ   . Hyperlipidemia   . Hypertension   . Obesity   . Diabetes mellitus    Past Surgical History  Procedure Laterality Date  . Icd  04/24/07    [name]. [name] II VR from [place] for nonischemic cardiomyopathy and class II heart failure   History reviewed. No pertinent family history. History  Substance Use Topics  . Smoking status: Former Smoker    Quit date: 07/28/2002  . Smokeless tobacco: Not on file  . Alcohol Use: No    Review of Systems  All systems reviewed and negative, other than as noted in HPI.   Allergies  Review of patient's allergies indicates no known allergies.  Home Medications   Prior to Admission medications   Medication Sig Start Date End Date Taking? Authorizing Provider  carvedilol (COREG) 25 MG tablet TAKE ONE TABLET BY MOUTH TWICE DAILY 04/15/13   Brooke O Edmisten, PA-C  furosemide (LASIX) 80 MG tablet TAKE ONE TABLET BY MOUTH ONCE DAILY 07/23/13   Evans Lance, MD  lisinopril (PRINIVIL,ZESTRIL) 40 MG tablet TAKE ONE TABLET BY MOUTH ONCE BID 04/15/13   Brooke O Edmisten, PA-C  metFORMIN (GLUCOPHAGE) 1000 MG tablet Take 1 tablet (1,000 mg total) by mouth 2 (two) times daily with a meal. 10/25/11   Evans Lance, MD   BP 188/122  Pulse 97  Temp(Src) 98.7 F  (37.1 C) (Oral)  Resp 18  SpO2 99% Physical Exam  Nursing note and vitals reviewed. Constitutional: He appears well-developed and well-nourished. No distress.  HENT:  Head: Normocephalic and atraumatic.  Eyes: Conjunctivae are normal. Right eye exhibits no discharge. Left eye exhibits no discharge.  Neck: Neck supple.  Cardiovascular: Normal rate, regular rhythm and normal heart sounds.  Exam reveals no gallop and no friction rub.   No murmur heard. Pulmonary/Chest: Effort normal and breath sounds normal. No respiratory distress.  AICD device left chest. No concerning overlying skin changes.  Abdominal: Soft. He exhibits no distension. There is no tenderness.  Musculoskeletal: He exhibits no edema and no tenderness.  Neurological: He is alert.  Skin: Skin is warm and dry.  Psychiatric: He has a normal mood and affect. His behavior is normal. Thought content normal.    ED Course  Procedures (including critical care time) Labs Review Labs Reviewed - No data to display  Imaging Review No results found.   EKG Interpretation   Date/Time:  Sunday Aug 03 2013 12:57:06 EDT Ventricular Rate:  87 PR Interval:  232 QRS Duration: 108 QT Interval:  412 QTC Calculation: 495 R Axis:   -122 Text Interpretation:  Sinus rhythm with 1st degree A-V block Biatrial  enlargement Right superior axis deviation Pulmonary disease pattern  Prolonged QT Abnormal ECG ED PHYSICIAN INTERPRETATION AVAILABLE IN [facility] Confirmed by TEST, Record ([id number]) on  08/05/2013 7:35:34 AM      MDM   Final diagnoses:  ICD (implantable cardioverter-defibrillator) in place    43 year old male presenting after his ICD vibrator earlier today. Known that device is at [facility]. Suspect that this is patient notification by device of this. He is already scheduled for an echocardiogram this Friday to assess his EF for possible generator change. He has no complains otherwise. I'm not familiar of all the specifics of  his device so will verify that this is reasonable.   Device was interrogated. Patient at [facility]. No concerning findings on interrogation. Discussed with patient that this device will likely continue 2 heart rate intermittently. Understands that he feels a shock or has any other concerning symptoms he needs return the emergency room right away. If not, followup this Friday for echocardiogram and with cardiology.  Virgel Manifold, MD 08/06/13 [phone]

## 2013-08-08 ENCOUNTER — Ambulatory Visit (HOSPITAL_COMMUNITY): Payer: Self-pay | Attending: Internal Medicine | Admitting: Radiology

## 2013-08-08 DIAGNOSIS — I509 Heart failure, unspecified: Secondary | ICD-10-CM

## 2013-08-08 DIAGNOSIS — I428 Other cardiomyopathies: Secondary | ICD-10-CM | POA: Insufficient documentation

## 2013-08-08 NOTE — Progress Notes (Signed)
Echocardiogram performed.  

## 2013-08-15 ENCOUNTER — Encounter: Payer: Self-pay | Admitting: *Deleted

## 2013-08-15 ENCOUNTER — Telehealth: Payer: Self-pay | Admitting: *Deleted

## 2013-08-15 NOTE — Telephone Encounter (Signed)
Informed pt of wound check and reviewed instructions. Patient verbalized understanding and agreeable to plan.

## 2013-08-15 NOTE — Telephone Encounter (Signed)
lmtcb  (Need to inform pt of wound check appointment & letter of instructions left at front desk for him to pick up next week at lab draw)

## 2013-08-17 ENCOUNTER — Other Ambulatory Visit: Payer: Self-pay | Admitting: *Deleted

## 2013-08-17 DIAGNOSIS — I428 Other cardiomyopathies: Secondary | ICD-10-CM

## 2013-08-20 ENCOUNTER — Encounter: Payer: Self-pay | Admitting: *Deleted

## 2013-08-20 ENCOUNTER — Other Ambulatory Visit (INDEPENDENT_AMBULATORY_CARE_PROVIDER_SITE_OTHER): Payer: Self-pay

## 2013-08-20 ENCOUNTER — Other Ambulatory Visit: Payer: Self-pay | Admitting: *Deleted

## 2013-08-20 DIAGNOSIS — Z4502 Encounter for adjustment and management of automatic implantable cardiac defibrillator: Secondary | ICD-10-CM

## 2013-08-20 DIAGNOSIS — I428 Other cardiomyopathies: Secondary | ICD-10-CM

## 2013-08-20 DIAGNOSIS — Z95810 Presence of automatic (implantable) cardiac defibrillator: Secondary | ICD-10-CM

## 2013-08-20 DIAGNOSIS — E876 Hypokalemia: Secondary | ICD-10-CM

## 2013-08-20 LAB — CBC WITH DIFFERENTIAL/PLATELET
BASOS ABS: 0 10*3/uL (ref 0.0–0.1)
Basophils Relative: 0.5 % (ref 0.0–3.0)
Eosinophils Absolute: 0.3 10*3/uL (ref 0.0–0.7)
Eosinophils Relative: 6 % — ABNORMAL HIGH (ref 0.0–5.0)
HCT: 39.3 % (ref 39.0–52.0)
Hemoglobin: 12.9 g/dL — ABNORMAL LOW (ref 13.0–17.0)
LYMPHS ABS: 1.4 10*3/uL (ref 0.7–4.0)
Lymphocytes Relative: 27.2 % (ref 12.0–46.0)
MCHC: 32.9 g/dL (ref 30.0–36.0)
MCV: 93.9 fl (ref 78.0–100.0)
MONO ABS: 0.4 10*3/uL (ref 0.1–1.0)
MONOS PCT: 8 % (ref 3.0–12.0)
NEUTROS ABS: 3.1 10*3/uL (ref 1.4–7.7)
Neutrophils Relative %: 58.3 % (ref 43.0–77.0)
PLATELETS: 182 10*3/uL (ref 150.0–400.0)
RBC: 4.19 Mil/uL — ABNORMAL LOW (ref 4.22–5.81)
RDW: 14.8 % (ref 11.5–15.5)
WBC: 5.2 10*3/uL (ref 4.0–10.5)

## 2013-08-20 LAB — BASIC METABOLIC PANEL
BUN: 20 mg/dL (ref 6–23)
CHLORIDE: 101 meq/L (ref 96–112)
CO2: 30 meq/L (ref 19–32)
Calcium: 8.7 mg/dL (ref 8.4–10.5)
Creatinine, Ser: 1.6 mg/dL — ABNORMAL HIGH (ref 0.4–1.5)
GFR: 60.64 mL/min (ref >60.00)
GLUCOSE: 137 mg/dL — AB (ref 70–99)
Potassium: 2.7 mEq/L — CL (ref 3.5–5.1)
SODIUM: 139 meq/L (ref 135–145)

## 2013-08-20 MED ORDER — POTASSIUM CHLORIDE CRYS ER 20 MEQ PO TBCR: 40.0000 meq | EXTENDED_RELEASE_TABLET | Freq: Every day | ORAL | Status: DC

## 2013-08-22 ENCOUNTER — Telehealth: Payer: Self-pay | Admitting: Internal Medicine

## 2013-08-22 ENCOUNTER — Encounter (HOSPITAL_COMMUNITY): Payer: Self-pay | Admitting: Pharmacy Technician

## 2013-08-22 NOTE — Telephone Encounter (Signed)
Spoke with the pharmacy and a 30day supply is $36, there is no $4 brand per pharmacist.   I have left a message for patient to pick up

## 2013-08-22 NOTE — Telephone Encounter (Signed)
I'm not sure, I cant find his insurance information, he may need to use the 4.00 [facility] brand but his K+ dose is high

## 2013-08-22 NOTE — Telephone Encounter (Signed)
New message    The cost of k+ is too hight $ 105.00 . Looking for something cheaper.

## 2013-08-22 NOTE — Telephone Encounter (Signed)
Why would this be so high?

## 2013-08-25 ENCOUNTER — Other Ambulatory Visit: Payer: Self-pay

## 2013-08-26 ENCOUNTER — Other Ambulatory Visit (INDEPENDENT_AMBULATORY_CARE_PROVIDER_SITE_OTHER): Payer: Self-pay

## 2013-08-26 DIAGNOSIS — E876 Hypokalemia: Secondary | ICD-10-CM

## 2013-08-26 LAB — BASIC METABOLIC PANEL
BUN: 24 mg/dL — ABNORMAL HIGH (ref 6–23)
CALCIUM: 9.3 mg/dL (ref 8.4–10.5)
CO2: 28 mEq/L (ref 19–32)
Chloride: 107 mEq/L (ref 96–112)
Creatinine, Ser: 2 mg/dL — ABNORMAL HIGH (ref 0.4–1.5)
GFR: 47.76 mL/min — AB (ref >60.00)
GLUCOSE: 123 mg/dL — AB (ref 70–99)
Potassium: 3.2 mEq/L — ABNORMAL LOW (ref 3.5–5.1)
SODIUM: 142 meq/L (ref 135–145)

## 2013-08-26 MED ORDER — CEFAZOLIN SODIUM-DEXTROSE 2-3 GM-% IV SOLR
2.0000 g | INTRAVENOUS | Status: DC
  Filled 2013-08-26: qty 50

## 2013-08-26 MED ORDER — SODIUM CHLORIDE 0.9 % IR SOLN
80.0000 mg | Status: DC
  Filled 2013-08-26 (×2): qty 2

## 2013-08-27 ENCOUNTER — Encounter (HOSPITAL_COMMUNITY)
Admission: RE | Disposition: A | Discharge status: Home / Self Care | Payer: Self-pay | Source: Ambulatory Visit | Attending: Internal Medicine

## 2013-08-27 ENCOUNTER — Ambulatory Visit (HOSPITAL_COMMUNITY)
Admission: RE | Admit: 2013-08-27 | Discharge: 2013-08-27 | Disposition: A | Discharge status: Home / Self Care | Payer: Self-pay | Source: Ambulatory Visit | Attending: Internal Medicine | Admitting: Internal Medicine

## 2013-08-27 DIAGNOSIS — I5022 Chronic systolic (congestive) heart failure: Secondary | ICD-10-CM | POA: Insufficient documentation

## 2013-08-27 DIAGNOSIS — I10 Essential (primary) hypertension: Secondary | ICD-10-CM | POA: Insufficient documentation

## 2013-08-27 DIAGNOSIS — E119 Type 2 diabetes mellitus without complications: Secondary | ICD-10-CM | POA: Insufficient documentation

## 2013-08-27 DIAGNOSIS — I509 Heart failure, unspecified: Secondary | ICD-10-CM | POA: Insufficient documentation

## 2013-08-27 DIAGNOSIS — Z6832 Body mass index (BMI) 32.0-32.9, adult: Secondary | ICD-10-CM | POA: Insufficient documentation

## 2013-08-27 DIAGNOSIS — Z87891 Personal history of nicotine dependence: Secondary | ICD-10-CM | POA: Insufficient documentation

## 2013-08-27 DIAGNOSIS — E785 Hyperlipidemia, unspecified: Secondary | ICD-10-CM | POA: Insufficient documentation

## 2013-08-27 DIAGNOSIS — Z4502 Encounter for adjustment and management of automatic implantable cardiac defibrillator: Secondary | ICD-10-CM | POA: Insufficient documentation

## 2013-08-27 DIAGNOSIS — I428 Other cardiomyopathies: Secondary | ICD-10-CM

## 2013-08-27 DIAGNOSIS — E669 Obesity, unspecified: Secondary | ICD-10-CM | POA: Insufficient documentation

## 2013-08-27 HISTORY — PX: IMPLANTABLE CARDIOVERTER DEFIBRILLATOR (ICD) GENERATOR CHANGE: SHX5469

## 2013-08-27 LAB — GLUCOSE, CAPILLARY
Glucose-Capillary: 98 mg/dL (ref 70–99)
Glucose-Capillary: 108 mg/dL — ABNORMAL HIGH (ref 70–99)

## 2013-08-27 LAB — POTASSIUM: Potassium: 3.1 mEq/L — ABNORMAL LOW (ref 3.7–5.3)

## 2013-08-27 LAB — SURGICAL PCR SCREEN
MRSA, PCR: NEGATIVE
Staphylococcus aureus: NEGATIVE

## 2013-08-27 SURGERY — ICD GENERATOR CHANGE
Anesthesia: LOCAL

## 2013-08-27 MED ORDER — MIDAZOLAM HCL 5 MG/5ML IJ SOLN
INTRAMUSCULAR | Status: AC
Start: 2013-08-27 — End: 2013-08-27
  Filled 2013-08-27: qty 5

## 2013-08-27 MED ORDER — FENTANYL CITRATE 0.05 MG/ML IJ SOLN
INTRAMUSCULAR | Status: AC
  Filled 2013-08-27: qty 2

## 2013-08-27 MED ORDER — LIDOCAINE HCL (PF) 1 % IJ SOLN
INTRAMUSCULAR | Status: AC
  Filled 2013-08-27: qty 60

## 2013-08-27 MED ORDER — MUPIROCIN 2 % EX OINT
TOPICAL_OINTMENT | Freq: Two times a day (BID) | CUTANEOUS | Status: DC
  Administered 2013-08-27: 08:00:00 via NASAL

## 2013-08-27 MED ORDER — ONDANSETRON HCL 4 MG/2ML IJ SOLN: 4.0000 mg | Freq: Four times a day (QID) | INTRAMUSCULAR | Status: DC | PRN

## 2013-08-27 MED ORDER — CHLORHEXIDINE GLUCONATE 4 % EX LIQD: 60.0000 mL | Freq: Once | CUTANEOUS | Status: DC

## 2013-08-27 MED ORDER — ACETAMINOPHEN 325 MG PO TABS
325.0000 mg | ORAL_TABLET | ORAL | Status: DC | PRN
Start: 2013-08-27 — End: 2013-08-27

## 2013-08-27 MED ORDER — SODIUM CHLORIDE 0.9 % IV SOLN
INTRAVENOUS | Status: DC
  Administered 2013-08-27: 08:00:00 via INTRAVENOUS

## 2013-08-27 MED ORDER — POTASSIUM CHLORIDE CRYS ER 20 MEQ PO TBCR
40.0000 meq | EXTENDED_RELEASE_TABLET | Freq: Once | ORAL | Status: AC
  Administered 2013-08-27: 40 meq via ORAL
  Filled 2013-08-27: qty 2

## 2013-08-27 MED ORDER — MUPIROCIN 2 % EX OINT
TOPICAL_OINTMENT | CUTANEOUS | Status: AC
  Filled 2013-08-27: qty 22

## 2013-08-27 NOTE — Discharge Instructions (Signed)
Pacemaker Battery Change, Care After  °Refer to this sheet in the next few weeks. These instructions provide you with information on caring for yourself after your procedure. Your health care provider may also give you more specific instructions. Your treatment has been planned according to current medical practices, but problems sometimes occur. Call your health care provider if you have any problems or questions after your procedure. °WHAT TO EXPECT AFTER THE PROCEDURE °After your procedure, it is typical to have the following sensations: °· Soreness at the pacemaker site. °HOME CARE INSTRUCTIONS  °· Keep the incision clean and dry. °· Unless advised otherwise, you may shower beginning 48 hours after your procedure. °· For the first week after the replacement, avoid stretching motions that pull at the incision site and avoid heavy exercise with the arm on the same side as the incision. °· Only take over-the-counter or prescription medicines for pain, discomfort, or fever as directed by your health care provider. °· Your health care provider will tell you when you will need to next test your pacemaker by telephone or when to return to the office for follow up for removal of stitches. °SEEK MEDICAL CARE IF:  °· You have pain at the incision site that is not relieved by over-the-counter or prescription medicine. °· There is drainage or pus from the incision site. °· There is swelling larger than a lime at the incision site. °· You develop red streaking that extends above or below the incision site. °· You feel brief, intermittent palpitations, lightheadedness, or any symptoms that you feel might be related to your heart. °SEEK IMMEDIATE MEDICAL CARE IF:  °· You experience chest pain that is different than the pain at the pacemaker site. °· Shortness of breath. °· Palpitations or irregular heart beat. °· Lightheadedness that does not go away quickly. °· Fainting. °· You have pain that gets worse and is not relieved by  medicine. °MAKE SURE YOU:  °· Understand these instructions. °· Will watch your condition. °· Will get help right away if you are not doing well or get worse. °Document Released: 12/18/2012 Document Reviewed: 09/11/2012 °ExitCare® Patient Information ©2014 [employer]. ° °

## 2013-08-27 NOTE — H&P (Signed)
HPI [name] returns today for followup. He is a very pleasant 43 year old man with chronic systolic heart failure, hypertension, non-ischemic CM, diabetes, status post ICD implantation. In the interim he has done well. He denies chest pain, shortness of breath, or syncope. No peripheral edema. He has reached ERI. He has not had an echo in 7 years. His heart failure is class 2. Subsequent repeat echo demonstrated an EF of 25-30% despite maximal medical therapy.    No Known Allergies      Current Outpatient Prescriptions   Medication  Sig  Dispense  Refill   .  carvedilol (COREG) 25 MG tablet  TAKE ONE TABLET BY MOUTH TWICE DAILY   60 tablet   0   .  furosemide (LASIX) 80 MG tablet  TAKE ONE TABLET BY MOUTH ONCE DAILY   30 tablet   0   .  lisinopril (PRINIVIL,ZESTRIL) 40 MG tablet  TAKE ONE TABLET BY MOUTH ONCE BID   60 tablet   3   .  metFORMIN (GLUCOPHAGE) 1000 MG tablet  Take 1 tablet (1,000 mg total) by mouth 2 (two) times daily with a meal.   60 tablet   6       No current facility-administered medications for this visit.           Past Medical History   Diagnosis  Date   .  Systolic heart failure     .  Cardiomyopathy     .  ICD (implantable cardiac defibrillator), single, in situ     .  Hyperlipidemia     .  Hypertension     .  Obesity     .  Diabetes mellitus          ROS:    All systems reviewed and negative except as noted in the HPI.      Past Surgical History   Procedure  Laterality  Date   .  Icd    04/24/07       [name]. [name] II VR from [place] for nonischemic cardiomyopathy and class II heart failure          No family history on file.      History       Social History   .  Marital Status:  Married       Spouse Name:  N/A       Number of Children:  N/A   .  Years of Education:  N/A       Occupational History   .  Not on file.       Social History Main Topics   .  Smoking status:  Former Smoker       Quit date:   07/28/2002   .  Smokeless tobacco:  Not on file   .  Alcohol Use:  No   .  Drug Use:  No   .  Sexual Activity:  Not on file       Other Topics  Concern   .  Not on file       Social History Narrative   .  No narrative on file          BP 160/100  Pulse 79  Ht 5\' 10"  (1.778 m)  Wt 223 lb (101.152 kg)  BMI 32.00 kg/m2   Physical Exam:   Well appearing NAD HEENT: Unremarkable except for very poor dentition Neck:  No JVD, no thyromegally Lungs:  Clear with no  wheezes, rales, or rhonchi. HEART:  Regular rate rhythm, no murmurs, no rubs, no clicks Abd:  soft, positive bowel sounds, no organomegally, no rebound, no guarding Ext:  2 plus pulses, no edema, no cyanosis, no clubbing Skin:  No rashes no nodules Neuro:  CN II through XII intact, motor grossly intact   DEVICE   Normal device function.  See PaceArt for details. Device is at [facility]   Assess/Plan:           [place], CHRONIC - Evans Lance, MD at 07/23/2013  9:14 AM    Status: Written Related Problem: SYSTOLIC HEART FAILURE, CHRONIC    His symptoms have remained class 2. His repeat echo several weeks ago demonstrated that his EF was 25-30%.  Will plan to proceed with removal of his old ICD and insertion of a new ICD. I have discussed the risks/benefits/goals/expectations of ICD generator change with the patient and he wishes to proceed.  Mikle Bosworth.D.

## 2013-08-27 NOTE — CV Procedure (Signed)
EP Procedure Note  Procedure: Removal of a previously implanted ICD which had reached ERI, and insertion of a new ICD   Indication: Chronic class 2 CHF, EF 25% s/p ICD at [facility]  Findings: After informed consent was obtained, the patient was taken to the diagnostic EP lab in the fasting state. After the usual preparation and draping, IV versed and fentanyl was used for sedation. 30 cc of Lidocaine was infiltrated into the left infraclavicular region. A 6 cm incision was made and electrocautery was utilized to dissect down to the facial plane. The ICD generator was removed with gentle traction and the lead was evaluated and found to be working satisfactorily. The R waves were 9 mV. The impedence was 316 ohms. The threshold was 0.9 V at 0.5 ms. The St. Jude Leslie VR, serial #[id number] was connected to the old lead and placed back into the sub cutaneous pocket. The pocket was irrigated with antibiotic irrigation and the incision was closed with 2 layers of vicryl suture. Benzoin and steristrips were placed on the skin. A bandage was placed over the wound and the patient was returned to the recovery area in satisfactory condition.   Complications: none immediately  Conclusion: Successful removal of a St. Jude ICD with insertion of a new St. Jude ICD without immediate complication.  Mikle Bosworth.D.

## 2013-08-27 NOTE — H&P (Signed)
  ICD Criteria  Current LVEF:25% ;Obtained < 1 month ago.  NYHA Functional Classification: Class II  Heart Failure History:  Yes, Duration of heart failure since onset is > 9 months  Non-Ischemic Dilated Cardiomyopathy History:  Yes, timeframe is > 9 months  Atrial Fibrillation/Atrial Flutter:  No.  Ventricular Tachycardia History:  No.  Cardiac Arrest History:  No  History of Syndromes with Risk of Sudden Death:  No.  Previous ICD:  Yes, ICD Type:  Single, Reason for ICD:  Primary prevention.  15%  Electrophysiology Study: No.  Prior MI: No.  PPM: No.  OSA:  No  Patient Life Expectancy of >=1 year: Yes.  Anticoagulation Therapy:  Patient is NOT on anticoagulation therapy.   Beta Blocker Therapy:  Yes.   Ace Inhibitor/ARB Therapy:  Yes.

## 2013-08-28 ENCOUNTER — Other Ambulatory Visit: Payer: Self-pay | Admitting: *Deleted

## 2013-08-28 DIAGNOSIS — E876 Hypokalemia: Secondary | ICD-10-CM

## 2013-08-29 ENCOUNTER — Other Ambulatory Visit: Payer: Self-pay | Admitting: Cardiology

## 2013-08-29 ENCOUNTER — Telehealth: Payer: Self-pay | Admitting: Internal Medicine

## 2013-08-29 NOTE — Telephone Encounter (Signed)
New message     Pt brought some forms by to be completed earlier this week-----have they been completed?

## 2013-08-29 NOTE — Telephone Encounter (Signed)
He is aware that we will contact him when the forms are ready

## 2013-09-02 ENCOUNTER — Encounter (HOSPITAL_COMMUNITY): Payer: Self-pay | Admitting: *Deleted

## 2013-09-03 ENCOUNTER — Telehealth: Payer: Self-pay | Admitting: Internal Medicine

## 2013-09-03 NOTE — Telephone Encounter (Signed)
New message     Talk to [facility] regarding forms we sent somewhere to be completed is going to cost him 25.00 per form.

## 2013-09-05 NOTE — Telephone Encounter (Signed)
Spoke with patient and he is coming in on Monday for his wound check.  I called Beverly with [employer] [phone]) and spoke with her about the patient and his paperwork  All he needs is a note to return to work with no restrictions.  He is going to be here on Mon for his wound check and we will be able to provide that for him and fax to her at [phone]

## 2013-09-05 NOTE — Telephone Encounter (Signed)
Spoke with patient and let him know I would be glad to get his papers back from [facility] and ask Dr Lovena Le if he would complete on Tues.  He said they were due today.

## 2013-09-08 ENCOUNTER — Ambulatory Visit (INDEPENDENT_AMBULATORY_CARE_PROVIDER_SITE_OTHER): Payer: Self-pay | Admitting: *Deleted

## 2013-09-08 ENCOUNTER — Encounter: Payer: Self-pay | Admitting: *Deleted

## 2013-09-08 DIAGNOSIS — E876 Hypokalemia: Secondary | ICD-10-CM

## 2013-09-08 DIAGNOSIS — I429 Cardiomyopathy, unspecified: Secondary | ICD-10-CM

## 2013-09-08 DIAGNOSIS — I5022 Chronic systolic (congestive) heart failure: Secondary | ICD-10-CM

## 2013-09-08 LAB — BASIC METABOLIC PANEL
BUN: 16 mg/dL (ref 6–23)
CHLORIDE: 102 meq/L (ref 96–112)
CO2: 27 mEq/L (ref 19–32)
Calcium: 8.5 mg/dL (ref 8.4–10.5)
Creatinine, Ser: 1.4 mg/dL (ref 0.4–1.5)
GFR: 73.66 mL/min (ref >60.00)
Glucose, Bld: 121 mg/dL — ABNORMAL HIGH (ref 70–99)
POTASSIUM: 3.1 meq/L — AB (ref 3.5–5.1)
SODIUM: 137 meq/L (ref 135–145)

## 2013-09-08 LAB — MDC_IDC_ENUM_SESS_TYPE_INCLINIC
Battery Remaining Longevity: 104.4 mo
Date Time Interrogation Session: 20150629113226
HighPow Impedance: 41.5969
HighPow Impedance: 42 Ohm
Lead Channel Pacing Threshold Amplitude: 1.25 V
Lead Channel Pacing Threshold Pulse Width: 0.5 ms
Lead Channel Setting Pacing Amplitude: 2.5 V
Lead Channel Setting Pacing Pulse Width: 0.5 ms
Lead Channel Setting Sensing Sensitivity: 0.5 mV
MDC IDC MSMT LEADCHNL RV IMPEDANCE VALUE: 300 Ohm
MDC IDC MSMT LEADCHNL RV PACING THRESHOLD AMPLITUDE: 1.25 V
MDC IDC MSMT LEADCHNL RV PACING THRESHOLD PULSEWIDTH: 0.5 ms
MDC IDC MSMT LEADCHNL RV SENSING INTR AMPL: 8.8 mV
MDC IDC PG SERIAL: 7200640
MDC IDC STAT BRADY RV PERCENT PACED: 0.05 %
Zone Setting Detection Interval: 300 ms

## 2013-09-08 NOTE — Progress Notes (Signed)
Wound check appointment. Steri-strips removed. Wound without redness, minimal edema noted. Incision edges approximated, wound well healed. Normal device function. Thresholds, sensing, and impedances consistent with implant measurements. Device programmed at 3.5V for extra safety margin until 3 month visit. Histogram distribution appropriate for patient and level of activity. No ventricular arrhythmias noted. Patient educated about wound care, arm mobility, lifting restrictions, shock plan. ROV in 3 months with implanting physician.

## 2013-09-10 ENCOUNTER — Other Ambulatory Visit: Payer: Self-pay | Admitting: *Deleted

## 2013-09-10 DIAGNOSIS — E876 Hypokalemia: Secondary | ICD-10-CM

## 2013-09-10 MED ORDER — SPIRONOLACTONE 25 MG PO TABS: 25.0000 mg | ORAL_TABLET | Freq: Every day | ORAL | Status: DC

## 2013-09-10 NOTE — Patient Instructions (Signed)
Will start Aldactone

## 2013-09-18 ENCOUNTER — Encounter: Payer: Self-pay | Admitting: Internal Medicine

## 2013-10-20 ENCOUNTER — Other Ambulatory Visit: Payer: Self-pay | Admitting: Internal Medicine

## 2013-10-21 ENCOUNTER — Other Ambulatory Visit: Payer: Self-pay

## 2013-10-21 MED ORDER — CARVEDILOL 25 MG PO TABS: 25.0000 mg | ORAL_TABLET | Freq: Two times a day (BID) | ORAL | Status: DC

## 2013-12-01 ENCOUNTER — Encounter: Payer: Self-pay | Admitting: Internal Medicine

## 2013-12-09 ENCOUNTER — Encounter: Payer: Self-pay | Admitting: Internal Medicine

## 2014-02-19 ENCOUNTER — Encounter (HOSPITAL_COMMUNITY): Payer: Self-pay | Admitting: Internal Medicine

## 2014-03-27 ENCOUNTER — Encounter: Payer: Self-pay | Admitting: *Deleted

## 2014-03-30 ENCOUNTER — Other Ambulatory Visit: Payer: Self-pay | Admitting: Internal Medicine

## 2014-04-23 ENCOUNTER — Other Ambulatory Visit: Payer: Self-pay | Admitting: Internal Medicine

## 2014-04-25 ENCOUNTER — Telehealth: Payer: Self-pay | Admitting: Physician Assistant

## 2014-04-25 MED ORDER — CARVEDILOL 25 MG PO TABS: 25.0000 mg | ORAL_TABLET | Freq: Two times a day (BID) | ORAL | Status: DC

## 2014-04-25 NOTE — Telephone Encounter (Addendum)
Pt called Saturday answering service. States he requested refill for Coreg but pharmacy hasn't received it yet. Rx request sent in 04/23/14 per EPIC. The refill request still appears to be pending. Since he had run out today, I offered to refill - sent in electronically to his pharmacy as requested. Yzabelle Calles PA-C

## 2014-04-30 ENCOUNTER — Other Ambulatory Visit: Payer: Self-pay

## 2014-04-30 MED ORDER — LISINOPRIL 40 MG PO TABS: 40.0000 mg | ORAL_TABLET | Freq: Every day | ORAL | Status: DC

## 2014-05-04 ENCOUNTER — Other Ambulatory Visit: Payer: Self-pay | Admitting: Internal Medicine

## 2014-06-17 ENCOUNTER — Encounter: Payer: Self-pay | Admitting: Internal Medicine

## 2014-06-17 ENCOUNTER — Ambulatory Visit (INDEPENDENT_AMBULATORY_CARE_PROVIDER_SITE_OTHER): Payer: Self-pay | Admitting: Internal Medicine

## 2014-06-17 VITALS — BP 166/124 | HR 75 | Ht 70.0 in | Wt 240.2 lb

## 2014-06-17 DIAGNOSIS — Z4502 Encounter for adjustment and management of automatic implantable cardiac defibrillator: Secondary | ICD-10-CM

## 2014-06-17 DIAGNOSIS — Z95810 Presence of automatic (implantable) cardiac defibrillator: Secondary | ICD-10-CM

## 2014-06-17 DIAGNOSIS — I10 Essential (primary) hypertension: Secondary | ICD-10-CM

## 2014-06-17 DIAGNOSIS — I5022 Chronic systolic (congestive) heart failure: Secondary | ICD-10-CM

## 2014-06-17 LAB — BASIC METABOLIC PANEL
BUN: 18 mg/dL (ref 6–23)
CALCIUM: 9.3 mg/dL (ref 8.4–10.5)
CHLORIDE: 98 meq/L (ref 96–112)
CO2: 34 meq/L — AB (ref 19–32)
CREATININE: 1.48 mg/dL (ref 0.40–1.50)
GFR: 66.57 mL/min (ref >60.00)
Glucose, Bld: 163 mg/dL — ABNORMAL HIGH (ref 70–99)
Potassium: 3 mEq/L — ABNORMAL LOW (ref 3.5–5.1)
Sodium: 138 mEq/L (ref 135–145)

## 2014-06-17 LAB — MDC_IDC_ENUM_SESS_TYPE_INCLINIC
Brady Statistic RV Percent Paced: 0.28 %
Date Time Interrogation Session: 20160406111447
HighPow Impedance: 47.625
Lead Channel Impedance Value: 337.5 Ohm
Lead Channel Pacing Threshold Pulse Width: 0.5 ms
Lead Channel Sensing Intrinsic Amplitude: 11.8 mV
Lead Channel Setting Pacing Pulse Width: 0.5 ms
MDC IDC MSMT BATTERY REMAINING LONGEVITY: 99.6 mo
MDC IDC MSMT LEADCHNL RV PACING THRESHOLD AMPLITUDE: 1 V
MDC IDC PG SERIAL: 7200640
MDC IDC SET LEADCHNL RV PACING AMPLITUDE: 2.5 V
MDC IDC SET LEADCHNL RV SENSING SENSITIVITY: 0.5 mV
Zone Setting Detection Interval: 300 ms

## 2014-06-17 MED ORDER — CARVEDILOL 25 MG PO TABS: 25.0000 mg | ORAL_TABLET | Freq: Two times a day (BID) | ORAL | Status: DC

## 2014-06-17 MED ORDER — LISINOPRIL 40 MG PO TABS: 40.0000 mg | ORAL_TABLET | Freq: Every day | ORAL | Status: DC

## 2014-06-17 MED ORDER — SPIRONOLACTONE 25 MG PO TABS: 25.0000 mg | ORAL_TABLET | Freq: Every day | ORAL | Status: DC

## 2014-06-17 MED ORDER — POTASSIUM CHLORIDE CRYS ER 20 MEQ PO TBCR: 40.0000 meq | EXTENDED_RELEASE_TABLET | Freq: Every day | ORAL | Status: DC

## 2014-06-17 MED ORDER — FUROSEMIDE 80 MG PO TABS: 80.0000 mg | ORAL_TABLET | Freq: Every day | ORAL | Status: DC

## 2014-06-17 NOTE — Patient Instructions (Signed)
Your physician recommends that you continue on your current medications as directed. Please refer to the Current Medication list given to you today.  Lab today: BMET  Remote monitoring is used to monitor your Pacemaker of ICD from home. This monitoring reduces the number of office visits required to check your device to one time per year. It allows [place] to keep an eye on the functioning of your device to ensure it is working properly. You are scheduled for a device check from home on 09/16/14. You may send your transmission at any time that day. If you have a wireless device, the transmission will be sent automatically. After your physician reviews your transmission, you will receive a postcard with your next transmission date.  Your physician wants you to follow-up in: 1 year with Dr. Lovena Le.  You will receive a reminder letter in the mail two months in advance. If you don't receive a letter, please call our office to schedule the follow-up appointment.

## 2014-06-17 NOTE — Assessment & Plan Note (Signed)
His symptoms are class II. I've encouraged the patient to reduce his salt intake. He will continue his current medical therapy.

## 2014-06-17 NOTE — Assessment & Plan Note (Signed)
His St. Jude ICD is working normally. We'll plan to recheck in several months. 

## 2014-06-17 NOTE — Progress Notes (Signed)
HPI Travis Bryan returns today for followup. He is a very pleasant 44 year old man with chronic systolic heart failure, hypertension, diabetes, status post ICD implantation. In the interim he has done well. He denies chest pain, shortness of breath, or syncope. No peripheral edema. He has undergone ICD generator change out several months ago. In the interim he has been stable with no sustained ventricular arrhythmias. He denies chest pain. He notes that at times his blood pressure is elevated. No Known Allergies   Current Outpatient Prescriptions  Medication Sig Dispense Refill  . carvedilol (COREG) 25 MG tablet Take 1 tablet (25 mg total) by mouth 2 (two) times daily with a meal. 60 tablet 11  . furosemide (LASIX) 80 MG tablet Take 1 tablet (80 mg total) by mouth daily. 90 tablet 3  . lisinopril (PRINIVIL,ZESTRIL) 40 MG tablet Take 1 tablet (40 mg total) by mouth daily. 90 tablet 3  . metFORMIN (GLUCOPHAGE) 1000 MG tablet Take 1 tablet (1,000 mg total) by mouth 2 (two) times daily with a meal. 60 tablet 6  . potassium chloride SA (K-DUR,KLOR-CON) 20 MEQ tablet Take 2 tablets (40 mEq total) by mouth daily. 180 tablet 3  . spironolactone (ALDACTONE) 25 MG tablet Take 1 tablet (25 mg total) by mouth daily. 90 tablet 3   No current facility-administered medications for this visit.     Past Medical History  Diagnosis Date  . Systolic heart failure   . Cardiomyopathy   . Hyperlipidemia   . Hypertension   . Obesity   . Diabetes mellitus     ROS:   All systems reviewed and negative except as noted in the HPI.   Past Surgical History  Procedure Laterality Date  . Icd  04/24/07; 08-27-2013    [name]. [name] II VR from [place] for nonischemic cardiomyopathy; gen chamge 08-27-2013  . Implantable cardioverter defibrillator (icd) generator change N/A 08/27/2013    Procedure: ICD GENERATOR CHANGE;  Surgeon: Evans Lance, MD;  Location: [facility] CATH LAB;  Service: Cardiovascular;  Laterality:  N/A;     Family History  Problem Relation Age of Onset  . Hypertension Mother   . Heart disease Mother      History   Social History  . Marital Status: Married    Spouse Name: N/A  . Number of Children: N/A  . Years of Education: N/A   Occupational History  . Not on file.   Social History Main Topics  . Smoking status: Former Smoker    Quit date: 07/28/2002  . Smokeless tobacco: Not on file  . Alcohol Use: No  . Drug Use: No  . Sexual Activity: Not on file   Other Topics Concern  . Not on file   Social History Narrative     BP 166/124 mmHg  Pulse 75  Ht 5\' 10"  (1.778 m)  Wt 240 lb 3.2 oz (108.954 kg)  BMI 34.47 kg/m2 Blood pressure was 140/98 by me Physical Exam:  Well appearing NAD HEENT: Unremarkable except for very poor dentition Neck:  7 cm JVD, no thyromegally Lungs:  Clear with no wheezes, rales, or rhonchi. HEART:  Regular rate rhythm, no murmurs, no rubs, no clicks, soft S4 gallop is present Abd:  soft, positive bowel sounds, no organomegally, no rebound, no guarding Ext:  2 plus pulses, no edema, no cyanosis, no clubbing Skin:  No rashes no nodules Neuro:  CN II through XII intact, motor grossly intact  DEVICE  Normal device function.  See PaceArt  for details.   Assess/Plan:

## 2014-06-17 NOTE — Assessment & Plan Note (Signed)
His blood pressure is slightly elevated on my exam. I've encouraged the patient to reduce his salt intake. He'll continue his current medications.

## 2014-06-18 ENCOUNTER — Telehealth: Payer: Self-pay | Admitting: Internal Medicine

## 2014-06-18 DIAGNOSIS — E876 Hypokalemia: Secondary | ICD-10-CM

## 2014-06-18 NOTE — Telephone Encounter (Signed)
New message ° ° ° ° °Returning a nurses call to get lab results °

## 2014-06-18 NOTE — Telephone Encounter (Addendum)
Spoke with patient and he has not been taking Potassium at all.  I have asked that he take 20 meq twice daily and have his labs repeated 07/02/14.  He says he has been trying to eat potassium rich foods

## 2014-06-18 NOTE — Telephone Encounter (Signed)
Left message for patient to call me back. 

## 2014-06-18 NOTE — Telephone Encounter (Signed)
Left message for patient to return my call.

## 2014-07-02 ENCOUNTER — Other Ambulatory Visit: Payer: Self-pay

## 2014-09-16 ENCOUNTER — Telehealth: Payer: Self-pay | Admitting: Cardiology

## 2014-09-16 ENCOUNTER — Encounter: Payer: Self-pay | Admitting: *Deleted

## 2014-09-16 NOTE — Telephone Encounter (Signed)
LMOVM reminding pt to send remote transmission.   

## 2014-09-17 ENCOUNTER — Encounter: Payer: Self-pay | Admitting: Cardiology

## 2015-01-04 ENCOUNTER — Other Ambulatory Visit: Payer: Self-pay | Admitting: Internal Medicine

## 2015-01-06 ENCOUNTER — Telehealth: Payer: Self-pay | Admitting: *Deleted

## 2015-01-06 ENCOUNTER — Other Ambulatory Visit: Payer: Self-pay | Admitting: Internal Medicine

## 2015-01-06 NOTE — Telephone Encounter (Signed)
Spoke with Claiborne Billings, she wants the pt to come in as soon as possible with the next available extender, talked to Hoskins and he will schedule pt in with the extender asap, he is coming in tomorrow and seeing Richardson Dopp. Will let Julaine Hua know what is going on.

## 2015-01-06 NOTE — Progress Notes (Addendum)
Cardiology Office Note   Date:  01/07/2015   ID:  Travis Bryan, DOB [birth year], MRN [id number]  PCP:  No PCP Per Patient  Cardiologist:  Dr. Cristopher Peru     Chief Complaint  Patient presents with  . Congestive Heart Failure     History of Present Illness: Travis Bryan is a 44 y.o. male with a hx of chronic systolic CHF, NICM, status post ICD, HTN, diabetes, HL.  Last seen by Dr. Lovena Le 4/16. Patient called in recently for refills on furosemide. He's apparently been taking higher than recommended dosages. He increased this on his own apparently because of fluid excess. He was added on to my schedule today for further evaluation.  He is here alone today.  He is somewhat of a poor historian.  He denies chest pain, syncope, orthopnea, PND.  He tells me that he takes Lasix 80 mg QD. Some days he takes 120 mg because he feels swollen.  He denies any significant dyspnea. He works at [employer].  He does not have insurance.    Studies/Reports Reviewed Today:  Echo 5/15 EF 15%, diffuse HK, restrictive physiology, trivial AI, trivial MR, mild LAE, moderate RVE, moderately reduced RVSF, moderate RAE, mild to moderate TR, PASP 43 mmHg  LHC 6/06 LM: Okay LAD: Proximal 20% LCx: Proximal 20-30% RCA: Unremarkable   Past Medical History  Diagnosis Date  . Chronic systolic CHF (congestive heart failure) ([place])   . NICM (nonischemic cardiomyopathy) ([place])     a. LHC 6/06: pLAD 20, pLCx 20-30; b. Echo 5/15:  EF 15%, diffuse HK, restrictive physiology, trivial AI, trivial MR, mild LAE, moderate RVE, moderately reduced RVSF, moderate RAE, mild to moderate TR, PASP 43 mmHg  . Hyperlipidemia   . Hypertension   . Obesity   . Diabetes mellitus   . S/P implantation of automatic cardioverter/defibrillator (AICD)     Past Surgical History  Procedure Laterality Date  . Icd  04/24/07; 08-27-2013    [name]. [name] II VR from [place] for nonischemic cardiomyopathy; gen chamge 08-27-2013    . Implantable cardioverter defibrillator (icd) generator change N/A 08/27/2013    Procedure: ICD GENERATOR CHANGE;  Surgeon: Evans Lance, MD;  Location: [facility] C[facility] LAB;  Service: Cardiovascular;  Laterality: N/A;     Current Outpatient Prescriptions  Medication Sig Dispense Refill  . carvedilol (COREG) 25 MG tablet Take 1 tablet (25 mg total) by mouth 2 (two) times daily with a meal. 60 tablet 11  . furosemide (LASIX) 80 MG tablet Take 1 tablet (80 mg total) by mouth 2 (two) times daily. 180 tablet 3  . lisinopril (PRINIVIL,ZESTRIL) 40 MG tablet Take 1 tablet (40 mg total) by mouth daily. 90 tablet 3  . metFORMIN (GLUCOPHAGE) 1000 MG tablet Take 1 tablet (1,000 mg total) by mouth 2 (two) times daily with a meal. 60 tablet 6  . potassium chloride SA (K-DUR,KLOR-CON) 20 MEQ tablet Take 2 tablets (40 mEq total) by mouth 2 (two) times daily. 360 tablet 3   No current facility-administered medications for this visit.    Allergies:   Review of patient's allergies indicates no known allergies.    Social History:   Social History   Social History  . Marital Status: Married    Spouse Name: N/A  . Number of Children: N/A  . Years of Education: N/A   Social History Main Topics  . Smoking status: Former Smoker    Quit date: 07/28/2002  . Smokeless tobacco: None  .  Alcohol Use: No  . Drug Use: No  . Sexual Activity: Not Asked   Other Topics Concern  . None   Social History Narrative    Family History:   Family History  Problem Relation Age of Onset  . Hypertension Mother   . Heart disease Mother   . Diabetes Mother      ROS:   Please see the history of present illness.   Review of Systems  All other systems reviewed and are negative.     PHYSICAL EXAM: VS:  BP 180/90 mmHg  Pulse 77  Ht 5\' 10"  (1.778 m)  Wt 230 lb 6.4 oz (104.509 kg)  BMI 33.06 kg/m2  SpO2 98%    Wt Readings from Last 3 Encounters:  01/07/15 230 lb 6.4 oz (104.509 kg)  06/17/14 240 lb 3.2 oz  (108.954 kg)  08/27/13 223 lb (101.152 kg)     GEN: Well nourished, well developed, in no acute distress HEENT: normal Neck: JVP 10-12 cm,   no masses Cardiac:  Normal S1/S2, RRR; no murmur,  no rubs or gallops, tight 1+ bilateral LE edema   Respiratory: + bibasilar rales, no wheezing. GI: soft, nontender, + distended  MS: no deformity or atrophy Skin: warm and dry  Neuro:  CNs II-XII intact, Strength and sensation are intact Psych: Normal affect   EKG:  EKG is ordered today.  It demonstrates:   NSR, LAD, IVCD, poor R-wave progression, QTc 484 ms, no change since prior tracing   Recent Labs: 06/17/2014: BUN 18; Creatinine, Ser 1.48; Potassium 3.0*; Sodium 138    Lipid Panel    Component Value Date/Time   CHOL 196 04/13/2009 1945   TRIG 107 04/13/2009 1945   HDL 39* 04/13/2009 1945   CHOLHDL 5.0 Ratio 04/13/2009 1945   VLDL 21 04/13/2009 1945   LDLCALC 136* 04/13/2009 1945      ASSESSMENT AND PLAN:  1. Acute on Chronic Systolic CHF:  He is volume overloaded. He has rales on exam, JVD is elevated and he has LE edema.  He denies significant symptoms however.  He reports NYHA 2 symptoms.  He would benefit from more intense treatment of his CHF.  First, he needs diuresis.   -  Increase Lasix to 80 mg bid  -  Increase K+ to 40 mEq bid   -  BMET, BNP today  -  BMET 1 week  2. Nonischemic Cardiomyopathy:  As noted, he needs more intensive treatment of his HF.  He stopped taking Spironolactone due to concerns over warnings on the PI.  He is currently on ACE inhibitor, beta-blocker.  He would probably benefit from changing his ACE to ARNI Delene Loll).  This may be difficult given finances.  This is my first meeting with him and, at this point, I am not sure of feasibility of his ability to navigate the [employer] assistance program.  -  Consider adding Hydralazine/Nitrates at FU    -  I will refer him to the HF Clinic.  He will benefit from social work, Automotive engineer, pharmacy support  3.  Status post AICD:  FU with EP as planned.   4. Hypertensive Heart Disease:  His BP is uncontrolled.  He has not taken any medications yet today.  Will try to start Hydralazine and Nitrates at FU.  I suspect HTN is the major contributor to his Cardiomyopathy.  5. Diabetes Mellitus:  He is on metformin.  However, he has no primary care FU for his diabetes.   -  Refer to [facility] for FU.    Medication Changes: Current medicines are reviewed at length with the patient today.  Concerns regarding medicines are as outlined above.  The following changes have been made:   Discontinued Medications   SPIRONOLACTONE (ALDACTONE) 25 MG TABLET    Take 1 tablet (25 mg total) by mouth daily.   Modified Medications   Modified Medication Previous Medication   FUROSEMIDE (LASIX) 80 MG TABLET furosemide (LASIX) 80 MG tablet      Take 1 tablet (80 mg total) by mouth 2 (two) times daily.    Take 1 tablet (80 mg total) by mouth daily.   POTASSIUM CHLORIDE SA (K-DUR,KLOR-CON) 20 MEQ TABLET potassium chloride SA (K-DUR,KLOR-CON) 20 MEQ tablet      Take 2 tablets (40 mEq total) by mouth 2 (two) times daily.    Take 2 tablets (40 mEq total) by mouth daily.   New Prescriptions   No medications on file   Labs/ tests ordered today include:   Orders Placed This Encounter  Procedures  . Basic Metabolic Panel (BMET)  . Basic Metabolic Panel (BMET)  . B Nat Peptide  . Ambulatory referral to Internal Medicine  . AMB referral to CHF clinic  . EKG 12-Lead     Disposition:    FU with me in 1 week. Refer to CHF Clinic 3-4 weeks.    Signed, Versie Starks, MHS 01/07/2015 10:18 AM    [facility] [place], [place], [place]  [address] Phone: [phone]; Fax: [phone]

## 2015-01-06 NOTE — Telephone Encounter (Signed)
calling for lasix refill, has a year supply on file, he states that [facility] filled all the refills, i will call the pharmacy to see what is going on and call the pt back with information.  Spoke to [employer], she confirmed pt is out of refills due to he took it [employer] and is using a discount card, i called pt back to see if he was taking more then he is prescribed, he stated that he is taking 1.5 tablets by mouth daily. Pt stated that he fells like 1 tab po qd is not enough and that he is still retaining fluid, so that is why he started taking 1.5 tab po qd, I will speak with Janan Halter for further advice concerning pts medication and if it should be changed.

## 2015-01-07 ENCOUNTER — Ambulatory Visit (INDEPENDENT_AMBULATORY_CARE_PROVIDER_SITE_OTHER): Payer: Self-pay | Admitting: Physician Assistant

## 2015-01-07 ENCOUNTER — Encounter: Payer: Self-pay | Admitting: Physician Assistant

## 2015-01-07 ENCOUNTER — Telehealth: Payer: Self-pay | Admitting: *Deleted

## 2015-01-07 VITALS — BP 180/90 | HR 77 | Ht 70.0 in | Wt 230.4 lb

## 2015-01-07 DIAGNOSIS — E785 Hyperlipidemia, unspecified: Secondary | ICD-10-CM

## 2015-01-07 DIAGNOSIS — I429 Cardiomyopathy, unspecified: Secondary | ICD-10-CM

## 2015-01-07 DIAGNOSIS — I5023 Acute on chronic systolic (congestive) heart failure: Secondary | ICD-10-CM

## 2015-01-07 DIAGNOSIS — I110 Hypertensive heart disease with heart failure: Secondary | ICD-10-CM

## 2015-01-07 DIAGNOSIS — E118 Type 2 diabetes mellitus with unspecified complications: Secondary | ICD-10-CM

## 2015-01-07 DIAGNOSIS — I5022 Chronic systolic (congestive) heart failure: Secondary | ICD-10-CM

## 2015-01-07 DIAGNOSIS — I428 Other cardiomyopathies: Secondary | ICD-10-CM

## 2015-01-07 LAB — BASIC METABOLIC PANEL
BUN: 21 mg/dL (ref 7–25)
CO2: 31 mmol/L (ref 20–31)
Calcium: 9.2 mg/dL (ref 8.6–10.3)
Chloride: 103 mmol/L (ref 98–110)
Creat: 1.54 mg/dL — ABNORMAL HIGH (ref 0.60–1.35)
GLUCOSE: 170 mg/dL — AB (ref 65–99)
Potassium: 3 mmol/L — ABNORMAL LOW (ref 3.5–5.3)
SODIUM: 141 mmol/L (ref 135–146)

## 2015-01-07 LAB — BRAIN NATRIURETIC PEPTIDE: Brain Natriuretic Peptide: 819.4 pg/mL — ABNORMAL HIGH (ref 0.0–100.0)

## 2015-01-07 MED ORDER — POTASSIUM CHLORIDE CRYS ER 20 MEQ PO TBCR: 40.0000 meq | EXTENDED_RELEASE_TABLET | Freq: Two times a day (BID) | ORAL | Status: DC

## 2015-01-07 MED ORDER — FUROSEMIDE 80 MG PO TABS: 80.0000 mg | ORAL_TABLET | Freq: Two times a day (BID) | ORAL | Status: DC

## 2015-01-07 NOTE — Patient Instructions (Signed)
Medication Instructions:  1. INCREASE LASIX TO 80 MG TWICE DAILY; NEW RX SENT  2. INCREASE POTASSIUM TO 40 MEQ TWICE DAILY; NEW RX SENT  Labwork: 1. TODAY BMET, BNP  2. 1 WEEK BMET  Testing/Procedures: NONE  Follow-Up: 1. SCOTT WEAVER, PAC IN 1 WEEK  2. YOU ARE BEING REFERRED TO THE HEART FAILURE CLINIC DX SYSTOLIC HF; YOU WILL NEED AN APPT IN 3-4 WEEKS PER SCOTT WEAVER, PAC  3. YOU ARE BEING REFERRED TO [facility]; DX DIABETES  Any Other Special Instructions Will Be Listed Below (If Applicable).     If you need a refill on your cardiac medications before your next appointment, please call your pharmacy.

## 2015-01-07 NOTE — Telephone Encounter (Signed)
Lmptcb on cell # to go over lab results and med change; home # disconnected.

## 2015-01-07 NOTE — Telephone Encounter (Signed)
See office note 01/07/2015 Richardson Dopp, PA-C   01/07/2015 12:04 PM

## 2015-01-08 MED ORDER — POTASSIUM CHLORIDE CRYS ER 20 MEQ PO TBCR: 40.0000 meq | EXTENDED_RELEASE_TABLET | Freq: Three times a day (TID) | ORAL | Status: DC

## 2015-01-08 NOTE — Telephone Encounter (Signed)
I rtnd pt's call; lmptcb to go over lab results

## 2015-01-08 NOTE — Telephone Encounter (Signed)
Corrected the quantity on Rx K+ ; should had been 540 tablets x 3 refills

## 2015-01-08 NOTE — Telephone Encounter (Signed)
Ptcb and has been notified of lab results and med change to increase K+ to 40 meq TID. New Rx sent in. BMET 11/4 when he sees PA. Pt vebalized plan of care with understanding.

## 2015-01-08 NOTE — Telephone Encounter (Signed)
Follow up     Returned a call to the nurse to get lab results. Verified number with pt.

## 2015-01-08 NOTE — Addendum Note (Signed)
Addended by: Michae Kava on: 01/08/2015 01:42 PM   Modules accepted: Orders

## 2015-01-14 NOTE — Progress Notes (Signed)
Cardiology Office Note   Date:  01/14/2015   ID:  Travis Bryan, DOB November 19, 1970, MRN [id number]  PCP:  No PCP Per Patient  Cardiologist:  Dr. Cristopher Peru     Chief Complaint  Patient presents with  . Follow-up  . Congestive Heart Failure     History of Present Illness: Travis Bryan is a 44 y.o. male with a hx of chronic systolic CHF, NICM, s/p ICD, HTN, DM, HL.  Last seen by Dr. Lovena Le 4/16.  I saw him 01/07/15.  He was volume overloaded and I adjusted his Lasix.  He returns for FU.     Studies/Reports Reviewed Today:  Echo 5/15 EF 15%, diffuse HK, restrictive physiology, trivial AI, trivial MR, mild LAE, moderate RVE, moderately reduced RVSF, moderate RAE, mild to moderate TR, PASP 43 mmHg  LHC 6/06 LM: Okay LAD: Proximal 20% LCx: Proximal 20-30% RCA: Unremarkable   Past Medical History  Diagnosis Date  . Chronic systolic CHF (congestive heart failure) ([place])   . NICM (nonischemic cardiomyopathy) ([place])     a. LHC 6/06: pLAD 20, pLCx 20-30; b. Echo 5/15:  EF 15%, diffuse HK, restrictive physiology, trivial AI, trivial MR, mild LAE, moderate RVE, moderately reduced RVSF, moderate RAE, mild to moderate TR, PASP 43 mmHg  . Hyperlipidemia   . Hypertension   . Obesity   . Diabetes mellitus   . S/P implantation of automatic cardioverter/defibrillator (AICD)     Past Surgical History  Procedure Laterality Date  . Icd  04/24/07; 08-27-2013    [name]. [name] II VR from [place] for nonischemic cardiomyopathy; gen chamge 08-27-2013  . Implantable cardioverter defibrillator (icd) generator change N/A 08/27/2013    Procedure: ICD GENERATOR CHANGE;  Surgeon: Evans Lance, MD;  [facility];  Service: Cardiovascular;  Laterality: N/A;     Current Outpatient Prescriptions  Medication Sig Dispense Refill  . carvedilol (COREG) 25 MG tablet Take 1 tablet (25 mg total) by mouth 2 (two) times daily with a meal. 60 tablet 11  . furosemide (LASIX) 80 MG tablet  Take 1 tablet (80 mg total) by mouth 2 (two) times daily. 180 tablet 3  . lisinopril (PRINIVIL,ZESTRIL) 40 MG tablet Take 1 tablet (40 mg total) by mouth daily. 90 tablet 3  . metFORMIN (GLUCOPHAGE) 1000 MG tablet Take 1 tablet (1,000 mg total) by mouth 2 (two) times daily with a meal. 60 tablet 6  . potassium chloride SA (K-DUR,KLOR-CON) 20 MEQ tablet Take 2 tablets (40 mEq total) by mouth 3 (three) times daily. 540 tablet 3   No current facility-administered medications for this visit.    Allergies:   Review of patient's allergies indicates no known allergies.    Social History:   Social History   Social History  . Marital Status: Married    Spouse Name: N/A  . Number of Children: N/A  . Years of Education: N/A   Social History Main Topics  . Smoking status: Former Smoker    Quit date: 07/28/2002  . Smokeless tobacco: Not on file  . Alcohol Use: No  . Drug Use: No  . Sexual Activity: Not on file   Other Topics Concern  . Not on file   Social History Narrative    Family History:   Family History  Problem Relation Age of Onset  . Hypertension Mother   . Heart disease Mother   . Diabetes Mother      ROS:   Please see the history of  present illness.   Review of Systems  All other systems reviewed and are negative.     PHYSICAL EXAM: VS:  There were no vitals taken for this visit.    Wt Readings from Last 3 Encounters:  01/07/15 230 lb 6.4 oz (104.509 kg)  06/17/14 240 lb 3.2 oz (108.954 kg)  08/27/13 223 lb (101.152 kg)     GEN: Well nourished, well developed, in no acute distress HEENT: normal Neck: JVP 10-12 cm,   no masses Cardiac:  Normal S1/S2, RRR; no murmur,  no rubs or gallops, tight 1+ bilateral LE edema   Respiratory: + bibasilar rales, no wheezing. GI: soft, nontender, + distended  MS: no deformity or atrophy Skin: warm and dry  Neuro:  CNs II-XII intact, Strength and sensation are intact Psych: Normal affect   EKG:  EKG is ordered today.   It demonstrates:      Recent Labs: 01/07/2015: BUN 21; Creat 1.54*; Potassium 3.0*; Sodium 141    Lipid Panel    Component Value Date/Time   CHOL 196 04/13/2009 1945   TRIG 107 04/13/2009 1945   HDL 39* 04/13/2009 1945   CHOLHDL 5.0 Ratio 04/13/2009 1945   VLDL 21 04/13/2009 1945   LDLCALC 136* 04/13/2009 1945      ASSESSMENT AND PLAN:  1. Acute on Chronic Systolic CHF:    2. Nonischemic Cardiomyopathy:   ACE inhibitor, beta-blocker.   3. Status post AICD:  FU with EP as planned.   4. Hypertensive Heart Disease:    5. Diabetes Mellitus:  He is on metformin.  I have referred him to [facility] for FU.    Medication Changes: Current medicines are reviewed at length with the patient today.  Concerns regarding medicines are as outlined above.  The following changes have been made:   Discontinued Medications   No medications on file   Modified Medications   No medications on file   New Prescriptions   No medications on file   Labs/ tests ordered today include:   No orders of the defined types were placed in this encounter.     Disposition:    FU with     Signed, Richardson Dopp, PA-C, MHS 01/14/2015 5:57 PM    [facility] [address], [place], [place]  [address] Phone: [phone]; Fax: [phone]    This encounter was created in error - please disregard.

## 2015-01-15 ENCOUNTER — Encounter: Payer: Self-pay | Admitting: Physician Assistant

## 2015-02-08 ENCOUNTER — Inpatient Hospital Stay (HOSPITAL_COMMUNITY): Admission: RE | Admit: 2015-02-08 | Payer: Self-pay | Source: Ambulatory Visit

## 2015-02-09 NOTE — Progress Notes (Signed)
Cardiology Office Note   Date:  02/10/2015   ID:  Travis Bryan, DOB 12/13/1970, MRN [id number]  PCP:  No PCP Per Patient  Cardiologist:  Dr. Cristopher Peru     Chief Complaint  Patient presents with  . Follow-up  . Congestive Heart Failure     History of Present Illness: Travis Bryan is a 44 y.o. male with a hx of chronic systolic CHF, NICM, status post ICD, HTN, diabetes, HL.  I saw him last month after he called in for refills on furosemide. He was apparently taking a higher than recommended dose. He increased this on his own because of fluid excess.  He had a paucity of symptoms but was significantly volume overloaded on exam. I adjusted his Lasix and scheduled him to FU in 1 week. He did not show for this appointment. I also referred him to the [facility], but he missed that appointment earlier this week.  He has a referral to [facility] for primary care pending.   Returns for FU.  He is doing well.  The patient denies chest pain, shortness of breath, syncope, orthopnea, PND or significant pedal edema. Denies HAs, blurry vision, unilateral weakness, speech difficulty.  He has not had any meds today.     Studies/Reports Reviewed Today:  Echo 5/15 EF 15%, diffuse HK, restrictive physiology, trivial AI, trivial MR, mild LAE, moderate RVE, moderately reduced RVSF, moderate RAE, mild to moderate TR, PASP 43 mmHg  LHC 6/06 LM: Okay LAD: Proximal 20% LCx: Proximal 20-30% RCA: Unremarkable   Past Medical History  Diagnosis Date  . Chronic systolic CHF (congestive heart failure) ([place])   . NICM (nonischemic cardiomyopathy) ([place])     a. LHC 6/06: pLAD 20, pLCx 20-30; b. Echo 5/15:  EF 15%, diffuse HK, restrictive physiology, trivial AI, trivial MR, mild LAE, moderate RVE, moderately reduced RVSF, moderate RAE, mild to moderate TR, PASP 43 mmHg  . Hyperlipidemia   . Hypertension   . Obesity   . Diabetes mellitus   . S/P implantation of automatic cardioverter/defibrillator  (AICD)     Past Surgical History  Procedure Laterality Date  . Icd  04/24/07; 08-27-2013    [name]. [name] II VR from [place] for nonischemic cardiomyopathy; gen chamge 08-27-2013  . Implantable cardioverter defibrillator (icd) generator change N/A 08/27/2013    Procedure: ICD GENERATOR CHANGE;  Surgeon: Evans Lance, MD;  Location: [facility] CATH LAB;  Service: Cardiovascular;  Laterality: N/A;     Current Outpatient Prescriptions  Medication Sig Dispense Refill  . carvedilol (COREG) 25 MG tablet Take 1 tablet (25 mg total) by mouth 2 (two) times daily with a meal. 60 tablet 11  . furosemide (LASIX) 80 MG tablet Take 1 tablet (80 mg total) by mouth 2 (two) times daily. 180 tablet 3  . lisinopril (PRINIVIL,ZESTRIL) 40 MG tablet Take 1 tablet (40 mg total) by mouth daily. 90 tablet 3  . metFORMIN (GLUCOPHAGE) 1000 MG tablet Take 1 tablet (1,000 mg total) by mouth 2 (two) times daily with a meal. 60 tablet 6  . hydrALAZINE (APRESOLINE) 25 MG tablet Take 1 tablet (25 mg total) by mouth 3 (three) times daily. 90 tablet 11  . potassium chloride SA (K-DUR,KLOR-CON) 10 MEQ tablet Take 4 tablets (40 mEq total) by mouth 3 (three) times daily. 360 tablet 11   No current facility-administered medications for this visit.    Allergies:   Review of patient's allergies indicates no known allergies.    Social  History:   Social History   Social History  . Marital Status: Married    Spouse Name: N/A  . Number of Children: N/A  . Years of Education: N/A   Social History Main Topics  . Smoking status: Former Smoker    Quit date: 07/28/2002  . Smokeless tobacco: None  . Alcohol Use: No  . Drug Use: No  . Sexual Activity: Not Asked   Other Topics Concern  . None   Social History Narrative    Family History:   Family History  Problem Relation Age of Onset  . Hypertension Mother   . Heart disease Mother   . Diabetes Mother      ROS:   Please see the history of present illness.     Review of Systems  All other systems reviewed and are negative.     PHYSICAL EXAM: VS:  BP 180/120 mmHg  Pulse 76  Ht 5\' 10"  (1.778 m)  Wt 227 lb (102.967 kg)  BMI 32.57 kg/m2    Wt Readings from Last 3 Encounters:  02/10/15 227 lb (102.967 kg)  01/07/15 230 lb 6.4 oz (104.509 kg)  06/17/14 240 lb 3.2 oz (108.954 kg)     GEN: Well nourished, well developed, in no acute distress HEENT: normal Neck: JVP 6-7 cm,   no masses Cardiac:  Normal S1/S2, RRR; no murmur,  no rubs or gallops, trace bilateral LE edema   Respiratory: coarse BS at bases bilat, no wheezing. GI: soft, nontender, non-distended  MS: no deformity or atrophy Skin: warm and dry  Neuro:  CNs II-XII intact, Strength and sensation are intact Psych: Normal affect   EKG:  EKG is ordered today.  It demonstrates:   NSR, HR 76, LAD, LVH, repol abnormality, QTc 483 ms, no changes   Recent Labs: 01/07/2015: BUN 21; Creat 1.54*; Potassium 3.0*; Sodium 141    Lipid Panel    Component Value Date/Time   CHOL 196 04/13/2009 1945   TRIG 107 04/13/2009 1945   HDL 39* 04/13/2009 1945   CHOLHDL 5.0 Ratio 04/13/2009 1945   VLDL 21 04/13/2009 1945   LDLCALC 136* 04/13/2009 1945      ASSESSMENT AND PLAN:  1. Chronic Systolic CHF:  Volume appears improved. He again denies any symptoms. Overall he is NYHA 2. Continue current dose of Lasix.  He is not taking K+ due to cost.  I printed off a coupon for K+ 10 mEq tabs.  We will contact the [facility] clinic to see if they can allow him to fill his Rx at their pharmacy to help with cost.  Check BMET today and repeat in 1 week.   2. Nonischemic Cardiomyopathy:   He stopped taking Spironolactone due to concerns over warnings on the PI.  I recommended that he take this but he is not willing to try it. Continue ACE inhibitor, beta-blocker.   BP is out of control.  Start Hydralazine 25 mg TID.  He did not show for the HF Clinic.  I will hold off on sending him back.    3. Status post  AICD:  FU with EP as planned.   4. Hypertensive Heart Disease:  As noted, his BP is out of control.  He has not had any medications yet today.  We discussed the importance of managing his BP.  We discussed the dangers of having a CVA and worsening HF.  I advised him to limit his salt.  His NICM is probably related to uncontrolled HTN.  Continue beta-blocker, ACE inhibitor.  Start Hydralazine.  Consider nitrates at FU. I may need to resume Norvasc at some point as well (he was on this in the past).    5. Diabetes Mellitus:  He is on metformin.  However, he has no primary care FU for his diabetes.  Referral to [facility] is pending.   6. CKD:  Stage 3.  Continue to keep a close eye on renal function and K+ with adjustments in diuretics and BP meds.     Medication Changes: Current medicines are reviewed at length with the patient today.  Concerns regarding medicines are as outlined above.  The following changes have been made:   Discontinued Medications   No medications on file   Modified Medications   Modified Medication Previous Medication   POTASSIUM CHLORIDE SA (K-DUR,KLOR-CON) 10 MEQ TABLET potassium chloride SA (K-DUR,KLOR-CON) 20 MEQ tablet      Take 4 tablets (40 mEq total) by mouth 3 (three) times daily.    Take 2 tablets (40 mEq total) by mouth 3 (three) times daily.   New Prescriptions   HYDRALAZINE (APRESOLINE) 25 MG TABLET    Take 1 tablet (25 mg total) by mouth 3 (three) times daily.   Labs/ tests ordered today include:   Orders Placed This Encounter  Procedures  . Basic Metabolic Panel (BMET)  . Basic Metabolic Panel (BMET)  . EKG 12-Lead     Disposition:    FU with me 2 weeks    Signed, Versie Starks, MHS 02/10/2015 12:53 PM    [facility] [place], [place],   [address] Phone: [phone]; Fax: [phone]

## 2015-02-10 ENCOUNTER — Encounter: Payer: Self-pay | Admitting: Physician Assistant

## 2015-02-10 ENCOUNTER — Ambulatory Visit (INDEPENDENT_AMBULATORY_CARE_PROVIDER_SITE_OTHER): Payer: Self-pay | Admitting: Physician Assistant

## 2015-02-10 VITALS — BP 180/120 | HR 76 | Ht 70.0 in | Wt 227.0 lb

## 2015-02-10 DIAGNOSIS — I429 Cardiomyopathy, unspecified: Secondary | ICD-10-CM

## 2015-02-10 DIAGNOSIS — I110 Hypertensive heart disease with heart failure: Secondary | ICD-10-CM

## 2015-02-10 DIAGNOSIS — E118 Type 2 diabetes mellitus with unspecified complications: Secondary | ICD-10-CM

## 2015-02-10 DIAGNOSIS — N183 Chronic kidney disease, stage 3 (moderate): Secondary | ICD-10-CM

## 2015-02-10 DIAGNOSIS — I428 Other cardiomyopathies: Secondary | ICD-10-CM

## 2015-02-10 DIAGNOSIS — Z95810 Presence of automatic (implantable) cardiac defibrillator: Secondary | ICD-10-CM

## 2015-02-10 DIAGNOSIS — I5023 Acute on chronic systolic (congestive) heart failure: Secondary | ICD-10-CM

## 2015-02-10 LAB — BASIC METABOLIC PANEL
BUN: 18 mg/dL (ref 7–25)
CALCIUM: 9.5 mg/dL (ref 8.6–10.3)
CO2: 32 mmol/L — ABNORMAL HIGH (ref 20–31)
Chloride: 99 mmol/L (ref 98–110)
Creat: 1.43 mg/dL — ABNORMAL HIGH (ref 0.60–1.35)
Glucose, Bld: 125 mg/dL — ABNORMAL HIGH (ref 65–99)
POTASSIUM: 2.9 mmol/L — AB (ref 3.5–5.3)
SODIUM: 141 mmol/L (ref 135–146)

## 2015-02-10 MED ORDER — HYDRALAZINE HCL 25 MG PO TABS: 25.0000 mg | ORAL_TABLET | Freq: Three times a day (TID) | ORAL | Status: DC

## 2015-02-10 MED ORDER — POTASSIUM CHLORIDE CRYS ER 10 MEQ PO TBCR: 40.0000 meq | EXTENDED_RELEASE_TABLET | Freq: Three times a day (TID) | ORAL | Status: DC

## 2015-02-10 NOTE — Patient Instructions (Signed)
Medication Instructions:  1. YOUR RX FOR POTASSIUM HAS BEEN CHANGED TO THE 10 MEQ TABLET SIZE; YOUR DIRECTIONS READ TO TAKE 4 TABLETS THREE TIMES A DAY  2. START HYDRALAZINE 25 MG DIRECTIONS READ TO TAKE 1 TABLET THREE TIMES DAILY  Labwork: 1. BMET TODAY  2. REPEAT BMET ON 02/17/15   Testing/Procedures: NONE  Follow-Up: 03/01/15 12:10 WITH SCOTT WEAVER, PAC  Any Other Special Instructions Will Be Listed Below (If Applicable).     If you need a refill on your cardiac medications before your next appointment, please call your pharmacy.

## 2015-02-11 ENCOUNTER — Telehealth: Payer: Self-pay | Admitting: *Deleted

## 2015-02-11 NOTE — Telephone Encounter (Signed)
Lmptcb to go over lab results and to verify if pt picked up K+ Rx yesterday. Home # disconnected.

## 2015-02-12 ENCOUNTER — Telehealth: Payer: Self-pay | Admitting: *Deleted

## 2015-02-12 NOTE — Telephone Encounter (Signed)
I cb pt to advised per Brynda Rim. PA to eat 2 banana's today, start K+ tomorrow as already discussed in previous phone note earlier.

## 2015-02-12 NOTE — Telephone Encounter (Signed)
Pt notified of lab results today. he states he has not picked up K+ yet, said he is getting it Saturday 12/3 because he was waiting for another of his medications to filled that will not be filled until 12/3.I advised pt eat a banana today, start K+ 12/3 and get lab work on Tuesday. Pt verbalized understanding to plan of care and is agreeable to plan of care.

## 2015-02-12 NOTE — Telephone Encounter (Signed)
F/u  Pt returning Rn phone call- can use [phone]. Please call back and discuss.

## 2015-02-17 ENCOUNTER — Other Ambulatory Visit: Payer: Self-pay

## 2015-02-18 ENCOUNTER — Other Ambulatory Visit: Payer: Self-pay

## 2015-02-19 ENCOUNTER — Telehealth: Payer: Self-pay | Admitting: *Deleted

## 2015-02-19 NOTE — Telephone Encounter (Signed)
Lmptcb to see about lab appt that was scheduled for yesterday 12/8 though pt did not show. Pt was started on K+ last week and needs a bmet to check K+ .

## 2015-03-01 ENCOUNTER — Ambulatory Visit: Payer: Self-pay | Admitting: Physician Assistant

## 2015-03-02 ENCOUNTER — Other Ambulatory Visit: Payer: Self-pay | Admitting: Internal Medicine

## 2015-03-02 NOTE — Progress Notes (Signed)
Cardiology Office Note    Date:  03/03/2015   ID:  Travis Bryan, DOB [birth year], MRN [id number]  PCP:  No PCP Per Patient  Cardiologist:  Dr. Cristopher Peru   Electrophysiologist:  Dr. Cristopher Peru   Chief Complaint  Patient presents with  . Follow-up    Hypertension  . Congestive Heart Failure    History of Present Illness:  Travis Bryan is a 44 y.o. male with a hx of chronic systolic CHF, NICM, status post ICD, HTN, diabetes, HL. I saw in October 2016 after he called in for refills on furosemide. He was apparently taking a higher than recommended dose. He increased this on his own because of fluid excess. He had a paucity of symptoms but was significantly volume overloaded on exam. I adjusted his Lasix.  I referred him to the CHF clinic. He missed this appointment. I have also been trying to get him established with primary care. Last seen by me 02/10/15.  Symptoms were stable. Volume status was improved. Blood pressure was out of control. I placed him on hydralazine. Follow-up labs demonstrated hypokalemia. The patient was having difficulty getting his potassium. I gave him a coupon to get a reduced cost on his potassium pills. He was supposed to follow-up with repeat blood work but did not show. He returns for follow-up.  He denies any chest pain, dyspnea, orthopnea, PND, edema. No syncope.     Past Medical History  Diagnosis Date  . Chronic systolic CHF (congestive heart failure) ([place])   . NICM (nonischemic cardiomyopathy) ([place])     a. LHC 6/06: pLAD 20, pLCx 20-30; b. Echo 5/15:  EF 15%, diffuse HK, restrictive physiology, trivial AI, trivial MR, mild LAE, moderate RVE, moderately reduced RVSF, moderate RAE, mild to moderate TR, PASP 43 mmHg  . Hyperlipidemia   . Hypertension   . Obesity   . Diabetes mellitus   . S/P implantation of automatic cardioverter/defibrillator (AICD)     Past Surgical History  Procedure Laterality Date  . Icd  04/24/07; 08-27-2013    [name].  [name] II VR from [place] for nonischemic cardiomyopathy; gen chamge 08-27-2013  . Implantable cardioverter defibrillator (icd) generator change N/A 08/27/2013    Procedure: ICD GENERATOR CHANGE;  Surgeon: Evans Lance, MD;  Location: [facility] CATH LAB;  Service: Cardiovascular;  Laterality: N/A;    Current Outpatient Prescriptions  Medication Sig Dispense Refill  . carvedilol (COREG) 25 MG tablet Take 1 tablet (25 mg total) by mouth 2 (two) times daily with a meal. 60 tablet 11  . furosemide (LASIX) 80 MG tablet Take 1 tablet (80 mg total) by mouth 2 (two) times daily. 180 tablet 3  . hydrALAZINE (APRESOLINE) 50 MG tablet Take 1 tablet (50 mg total) by mouth 3 (three) times daily. 90 tablet 11  . lisinopril (PRINIVIL,ZESTRIL) 40 MG tablet TAKE ONE TABLET BY MOUTH ONCE DAILY 90 tablet 0  . metFORMIN (GLUCOPHAGE) 1000 MG tablet Take 1 tablet (1,000 mg total) by mouth 2 (two) times daily with a meal. 60 tablet 6  . [START ON 03/05/2015] spironolactone (ALDACTONE) 25 MG tablet Take 1 tablet (25 mg total) by mouth daily. 30 tablet 11   No current facility-administered medications for this visit.    Allergies:   Review of patient's allergies indicates no known allergies.   Social History   Social History  . Marital Status: Married    Spouse Name: N/A  . Number of Children: N/A  . Years of Education:  N/A   Social History Main Topics  . Smoking status: Former Smoker    Quit date: 07/28/2002  . Smokeless tobacco: None  . Alcohol Use: No  . Drug Use: No  . Sexual Activity: Not Asked   Other Topics Concern  . None   Social History Narrative     Family History:  The patient's family history includes Diabetes in his mother; Heart disease in his mother; Hypertension in his mother.   ROS:   Please see the history of present illness.    ROS All other systems reviewed and are negative.   PHYSICAL EXAM:   VS:  BP 180/112 mmHg  Pulse 83  Ht 5\' 10"  (1.778 m)  Wt 237 lb 12.8 oz  (107.865 kg)  BMI 34.12 kg/m2   GEN: Well nourished, well developed, in no acute distress HEENT: normal Neck: + JVD, no masses Cardiac: Normal S1/S2,  RRR; no murmurs, rubs, or gallops, Trace-1+ bilateral LE edema  Respiratory:  Decreased breath sounds bilaterally; no wheezing, rhonchi or rales GI: soft, nontender, nondistended, + BS MS: no deformity or atrophy Skin: warm and dry, no rash Neuro:  no focal deficits  Psych: Alert and oriented x 3, normal affect  Wt Readings from Last 3 Encounters:  03/03/15 237 lb 12.8 oz (107.865 kg)  02/10/15 227 lb (102.967 kg)  01/07/15 230 lb 6.4 oz (104.509 kg)      Studies/Labs Reviewed:   EKG:  EKG is not ordered today.  The ekg ordered today demonstrates n/a  Recent Labs: 02/10/2015: BUN 18; Creat 1.43*; Potassium 2.9*; Sodium 141   Recent Lipid Panel    Component Value Date/Time   CHOL 196 04/13/2009 1945   TRIG 107 04/13/2009 1945   HDL 39* 04/13/2009 1945   CHOLHDL 5.0 Ratio 04/13/2009 1945   VLDL 21 04/13/2009 1945   LDLCALC 136* 04/13/2009 1945    Additional studies/ records that were reviewed today include:   Echo 5/15 EF 15%, diffuse HK, restrictive physiology, trivial AI, trivial MR, mild LAE, moderate RVE, moderately reduced RVSF, moderate RAE, mild to moderate TR, PASP 43 mmHg  LHC 6/06 LM: Okay LAD: Proximal 20% LCx: Proximal 20-30% RCA: Unremarkable   ASSESSMENT:    1. Chronic systolic heart failure ([place])   2. Non-ischemic cardiomyopathy ([place])   3. ICD (implantable cardioverter-defibrillator) in place   4. Hypertensive heart disease with heart failure ([place])   5. CKD (chronic kidney disease), stage 3 (moderate)   6. Hypokalemia   7. Acute on chronic systolic CHF (congestive heart failure) ([place])   8. Type 2 diabetes mellitus with complication, without long-term current use of insulin (HCC)     PLAN:  In order of problems listed above:  1. Chronic Systolic CHF: He is chronically volume overloaded.  However, he does not complain of any symptoms. Overall he is NYHA 2.   His care is complicated by his inability to take potassium supplementation. I called his pharmacy today. There is no way to get potassium cheaper.  His expense is driven by the fact he has to take 12 tablets a day.  He has not taken K+ in months.  I again asked him to take Spironolactone.  He is concerned about side effects and the possibility of ICD discharge.  I had a very long conversation with him today.  We discussed the dangers of ventricular arrhythmias with low K+ and high K+.  After much discussion, he is willing to take Spironolactone.  2. Nonischemic Cardiomyopathy: He stopped  taking Spironolactone due to concerns over warnings on the PI.  Continue ACE inhibitor, beta-blocker, Hydralazine.  As noted, he is willing to take Spironolactone.  -  Start Spironolactone 25 mg QD.  -  BMET today  -  Repeat BMET 5 days and 12 days after 1st dose.  -  We discussed the importance of close FU on renal function and K+ with starting Spironolactone.   3. Status post AICD: FU with EP as planned.   4. Hypertensive Heart Disease:  His NICM is probably related to uncontrolled HTN. Continue beta-blocker, ACE inhibitor, Hydralazine. BP is uncontrolled.  We had a long discussion regarding the dangers of uncontrolled HTN to include HF, CVA and ESRD.    -  Increase Hydralazine to 50 mg TID  -  Start Spironolactone as noted.   5. CKD: Stage 3. Continue to keep a close eye on renal function and K+ with adjustments in diuretics and BP meds as noted.     6. Hypokalemia - Repeat BMET today.  Start Spironolactone.  Monitor K+ closely as outlined above.  7. Diabetes - We will see if the [facility] will arrange his appointment.  He has not been contacted yet.     Medication Adjustments/Labs and Tests Ordered: Current medicines are reviewed at length with the patient today.  Concerns regarding medicines are  outlined above.  Medication changes, Labs and Tests ordered today are listed below. Patient Instructions  Medication Instructions:  1. STOP POTASSIUM  2. START SPIRONOLACTONE 25 MG DAILY  3. INCREASE HYDRALAZINE 50 MG 1 TABLET THREE TIMES DAILY  Labwork: 1. TODAY BMET  2. BMET TO BE DONE ON 03/09/15; DUE TO NEW START OF SPIRONOLACTONE  3. BMET TO BE DONE ON 03/16/15; DUE TO NEW START OF SPIRONOLACTONE  Testing/Procedures: NONE  Follow-Up: 1. SCOTT WEAVER, PAC 1 MONTH  2. YOU NEED TO SCHEDULE AN APPT AT [facility]  Any Other Special Instructions Will Be Listed Below (If Applicable).   If you need a refill on your cardiac medications before your next appointment, please call your pharmacy.        Signed, Richardson Dopp, PA-C  03/03/2015 11:52 AM    [facility] [place], [place],   [address] Phone: [phone]; Fax: [phone]

## 2015-03-03 ENCOUNTER — Ambulatory Visit (INDEPENDENT_AMBULATORY_CARE_PROVIDER_SITE_OTHER): Payer: Self-pay | Admitting: Physician Assistant

## 2015-03-03 ENCOUNTER — Encounter: Payer: Self-pay | Admitting: Physician Assistant

## 2015-03-03 VITALS — BP 180/112 | HR 83 | Ht 70.0 in | Wt 237.8 lb

## 2015-03-03 DIAGNOSIS — E118 Type 2 diabetes mellitus with unspecified complications: Secondary | ICD-10-CM

## 2015-03-03 DIAGNOSIS — I428 Other cardiomyopathies: Secondary | ICD-10-CM

## 2015-03-03 DIAGNOSIS — I429 Cardiomyopathy, unspecified: Secondary | ICD-10-CM

## 2015-03-03 DIAGNOSIS — N183 Chronic kidney disease, stage 3 (moderate): Secondary | ICD-10-CM

## 2015-03-03 DIAGNOSIS — I5022 Chronic systolic (congestive) heart failure: Secondary | ICD-10-CM

## 2015-03-03 DIAGNOSIS — Z95810 Presence of automatic (implantable) cardiac defibrillator: Secondary | ICD-10-CM

## 2015-03-03 DIAGNOSIS — I110 Hypertensive heart disease with heart failure: Secondary | ICD-10-CM

## 2015-03-03 DIAGNOSIS — E876 Hypokalemia: Secondary | ICD-10-CM

## 2015-03-03 DIAGNOSIS — I5023 Acute on chronic systolic (congestive) heart failure: Secondary | ICD-10-CM

## 2015-03-03 LAB — BASIC METABOLIC PANEL
BUN: 17 mg/dL (ref 7–25)
CALCIUM: 8.8 mg/dL (ref 8.6–10.3)
CO2: 33 mmol/L — ABNORMAL HIGH (ref 20–31)
CREATININE: 1.48 mg/dL — AB (ref 0.60–1.35)
Chloride: 97 mmol/L — ABNORMAL LOW (ref 98–110)
Glucose, Bld: 164 mg/dL — ABNORMAL HIGH (ref 65–99)
POTASSIUM: 3.1 mmol/L — AB (ref 3.5–5.3)
Sodium: 137 mmol/L (ref 135–146)

## 2015-03-03 MED ORDER — HYDRALAZINE HCL 50 MG PO TABS: 50.0000 mg | ORAL_TABLET | Freq: Three times a day (TID) | ORAL | Status: DC

## 2015-03-03 MED ORDER — SPIRONOLACTONE 25 MG PO TABS: 25.0000 mg | ORAL_TABLET | Freq: Every day | ORAL | Status: DC

## 2015-03-03 NOTE — Patient Instructions (Addendum)
Medication Instructions:  1. STOP POTASSIUM  2. START SPIRONOLACTONE 25 MG DAILY  3. INCREASE HYDRALAZINE 50 MG 1 TABLET THREE TIMES DAILY  Labwork: 1. TODAY BMET  2. BMET TO BE DONE ON 03/09/15; DUE TO NEW START OF SPIRONOLACTONE  3. BMET TO BE DONE ON 03/16/15; DUE TO NEW START OF SPIRONOLACTONE  Testing/Procedures: NONE  Follow-Up: 1. SCOTT WEAVER, PAC 1 MONTH  2. YOU NEED TO SCHEDULE AN APPT AT [facility]  Any Other Special Instructions Will Be Listed Below (If Applicable).   If you need a refill on your cardiac medications before your next appointment, please call your pharmacy.

## 2015-03-05 ENCOUNTER — Telehealth: Payer: Self-pay | Admitting: *Deleted

## 2015-03-05 NOTE — Telephone Encounter (Signed)
Lmom lab work ok; lmom for pt to make sure he comes in 12/27 for bmet. LMOM cb [phone] if any questions.

## 2015-03-09 ENCOUNTER — Other Ambulatory Visit (INDEPENDENT_AMBULATORY_CARE_PROVIDER_SITE_OTHER): Payer: Self-pay | Admitting: *Deleted

## 2015-03-09 DIAGNOSIS — I5022 Chronic systolic (congestive) heart failure: Secondary | ICD-10-CM

## 2015-03-09 LAB — BASIC METABOLIC PANEL
BUN: 21 mg/dL (ref 7–25)
CALCIUM: 9.6 mg/dL (ref 8.6–10.3)
CHLORIDE: 99 mmol/L (ref 98–110)
CO2: 30 mmol/L (ref 20–31)
Creat: 1.58 mg/dL — ABNORMAL HIGH (ref 0.60–1.35)
GLUCOSE: 206 mg/dL — AB (ref 65–99)
POTASSIUM: 3.3 mmol/L — AB (ref 3.5–5.3)
Sodium: 140 mmol/L (ref 135–146)

## 2015-03-09 NOTE — Addendum Note (Signed)
Addended by: Eulis Foster on: 03/09/2015 08:03 AM   Modules accepted: Orders

## 2015-03-11 ENCOUNTER — Telehealth: Payer: Self-pay | Admitting: Physician Assistant

## 2015-03-11 NOTE — Telephone Encounter (Signed)
F/u  Pt returning Rn phone call. Please call back and discuss.   

## 2015-03-11 NOTE — Telephone Encounter (Signed)
Pt notified of lab results and findings by phone with verbal understanding. Pt states he is taking the Spironolactone, and is scheduled for bmet 03/16/15. Pt advised to keep lab appt to monitor K+.

## 2015-03-16 ENCOUNTER — Other Ambulatory Visit (INDEPENDENT_AMBULATORY_CARE_PROVIDER_SITE_OTHER): Payer: Self-pay | Admitting: *Deleted

## 2015-03-16 DIAGNOSIS — I5022 Chronic systolic (congestive) heart failure: Secondary | ICD-10-CM

## 2015-03-16 LAB — BASIC METABOLIC PANEL
BUN: 18 mg/dL (ref 7–25)
CALCIUM: 8.9 mg/dL (ref 8.6–10.3)
CO2: 32 mmol/L — ABNORMAL HIGH (ref 20–31)
CREATININE: 1.46 mg/dL — AB (ref 0.60–1.35)
Chloride: 100 mmol/L (ref 98–110)
Glucose, Bld: 183 mg/dL — ABNORMAL HIGH (ref 65–99)
Potassium: 3.4 mmol/L — ABNORMAL LOW (ref 3.5–5.3)
Sodium: 140 mmol/L (ref 135–146)

## 2015-03-17 ENCOUNTER — Telehealth: Payer: Self-pay | Admitting: *Deleted

## 2015-03-17 DIAGNOSIS — I5022 Chronic systolic (congestive) heart failure: Secondary | ICD-10-CM

## 2015-03-17 MED ORDER — POTASSIUM CHLORIDE ER 10 MEQ PO TBCR
10.0000 meq | EXTENDED_RELEASE_TABLET | Freq: Every day | ORAL | Status: DC
Start: 2015-03-17 — End: 2016-04-14

## 2015-03-17 NOTE — Telephone Encounter (Signed)
Pt notified of lab results and to start K+ 10 meq daily, bmet 1/13..Pt agreeable to plan of care.

## 2015-03-26 ENCOUNTER — Other Ambulatory Visit: Payer: Self-pay

## 2015-04-04 NOTE — Progress Notes (Signed)
Cardiology Office Note:    Date:  04/05/2015   ID:  Travis Bryan, DOB [birth year], MRN [id number]  PCP:  No PCP Per Patient  Cardiologist:  Dr. Cristopher Peru   Electrophysiologist:  Dr. Cristopher Peru   Chief Complaint  Patient presents with  . Congestive Heart Failure    Follow up    History of Present Illness:    Travis Bryan is a 45 y.o. male with a hx of chronic systolic CHF, NICM, s/p ICD, HTN, diabetes, HL. I saw him for the 1st time in October 2016.  He had a paucity of symptoms but was significantly volume overloaded on exam. I adjusted his Lasix. I referred him to the CHF clinic but missed the appointment. I have also been trying to get him established with primary care.   His BP has remained out of control. I have placed him on hydralazine. He has persistent hypokalemia. He was having difficulty getting his potassium due to cost.  At last visit in 12/16, I was able to convince him to take Spironolactone (previously concerned about potential side effects).  K+ has improved but remains low and low dose K+ supplement was started.  He returns for FU.    He stopped taking the Spironolactone when he was started on the K+.  He has not taken any medications yet today.  He only takes Hydralazine once a day. He has still not arranged an appointment with primary care.  He denies chest pain, dyspnea, syncope, orthopnea, PND, edema.     Past Medical History  Diagnosis Date  . Chronic systolic CHF (congestive heart failure) ([place])   . NICM (nonischemic cardiomyopathy) ([place])     a. LHC 6/06: pLAD 20, pLCx 20-30; b. Echo 5/15:  EF 15%, diffuse HK, restrictive physiology, trivial AI, trivial MR, mild LAE, moderate RVE, moderately reduced RVSF, moderate RAE, mild to moderate TR, PASP 43 mmHg  . Hyperlipidemia   . Hypertension   . Obesity   . Diabetes mellitus   . S/P implantation of automatic cardioverter/defibrillator (AICD)     Past Surgical History  Procedure Laterality Date  .  Icd  04/24/07; 08-27-2013    [name]. [name] II VR from [place] for nonischemic cardiomyopathy; gen chamge 08-27-2013  . Implantable cardioverter defibrillator (icd) generator change N/A 08/27/2013    Procedure: ICD GENERATOR CHANGE;  Surgeon: Evans Lance, MD;  Location: [facility] CATH LAB;  Service: Cardiovascular;  Laterality: N/A;    Current Medications: Outpatient Prescriptions Prior to Visit  Medication Sig Dispense Refill  . carvedilol (COREG) 25 MG tablet Take 1 tablet (25 mg total) by mouth 2 (two) times daily with a meal. 60 tablet 11  . furosemide (LASIX) 80 MG tablet Take 1 tablet (80 mg total) by mouth 2 (two) times daily. 180 tablet 3  . hydrALAZINE (APRESOLINE) 50 MG tablet Take 1 tablet (50 mg total) by mouth 3 (three) times daily. 90 tablet 11  . lisinopril (PRINIVIL,ZESTRIL) 40 MG tablet TAKE ONE TABLET BY MOUTH ONCE DAILY 90 tablet 0  . metFORMIN (GLUCOPHAGE) 1000 MG tablet Take 1 tablet (1,000 mg total) by mouth 2 (two) times daily with a meal. 60 tablet 6  . potassium chloride (K-DUR) 10 MEQ tablet Take 1 tablet (10 mEq total) by mouth daily. 30 tablet 11  . spironolactone (ALDACTONE) 25 MG tablet Take 1 tablet (25 mg total) by mouth daily. (Patient not taking: Reported on 04/05/2015) 30 tablet 11   No facility-administered medications prior to  visit.     Allergies:   Review of patient's allergies indicates no known allergies.   Social History   Social History  . Marital Status: Married    Spouse Name: N/A  . Number of Children: N/A  . Years of Education: N/A   Social History Main Topics  . Smoking status: Former Smoker    Quit date: 07/28/2002  . Smokeless tobacco: None  . Alcohol Use: No  . Drug Use: No  . Sexual Activity: Not Asked   Other Topics Concern  . None   Social History Narrative     Family History:  The patient's family history includes Diabetes in his mother; Heart disease in his mother; Hypertension in his mother.   ROS:   Please see  the history of present illness.    ROS All other systems reviewed and are negative.   Physical Exam:    VS:  BP 160/120 mmHg  Pulse 74  Ht 5\' 10"  (1.778 m)  Wt 232 lb (105.235 kg)  BMI 33.29 kg/m2   GEN: Well nourished, well developed, in no acute distress HEENT: normal Neck: + JVD, no masses Cardiac: Normal S1/S2, RRR; no murmurs; no edema    Respiratory:  clear to auscultation bilaterally; no wheezing, rhonchi or rales GI: soft, nontender MS: no deformity or atrophy Skin: warm and dry, no rash Neuro:   no focal deficits  Psych: Alert and oriented x 3, normal affect  Wt Readings from Last 3 Encounters:  04/05/15 232 lb (105.235 kg)  03/03/15 237 lb 12.8 oz (107.865 kg)  02/10/15 227 lb (102.967 kg)      Studies/Labs Reviewed:    EKG:  EKG is not ordered today.  The ekg ordered today demonstrates n/a  Recent Labs: 03/16/2015: BUN 18; Creat 1.46*; Potassium 3.4*; Sodium 140   Recent Lipid Panel    Component Value Date/Time   CHOL 196 04/13/2009 1945   TRIG 107 04/13/2009 1945   HDL 39* 04/13/2009 1945   CHOLHDL 5.0 Ratio 04/13/2009 1945   VLDL 21 04/13/2009 1945   LDLCALC 136* 04/13/2009 1945    Additional studies/ records that were reviewed today include:   Echo 5/15 EF 15%, diffuse HK, restrictive physiology, trivial AI, trivial MR, mild LAE, moderate RVE, moderately reduced RVSF, moderate RAE, mild to moderate TR, PASP 43 mmHg  LHC 6/06 LM: Okay LAD: Proximal 20% LCx: Proximal 20-30% RCA: Unremarkable   ASSESSMENT:    1. Chronic systolic CHF (congestive heart failure) ([place])   2. Non-ischemic cardiomyopathy ([place])   3. ICD (implantable cardioverter-defibrillator) in place   4. Hypertensive heart disease with heart failure ([place])   5. CKD (chronic kidney disease), stage 3 (moderate)     PLAN:    In order of problems listed above:  1. Chronic Systolic CHF - He remains NYHA 2.  I am not certain about his adherence with medical therapy.  I have  continuously tried to counsel him on the dangers of uncontrolled HTN and how it relates to his CKD and CHF.  He tells me today that I have never told him about his kidneys and feels that we tell him one thing on one occasion and a different thing on another.  I again went over all of his comorbid conditions today and reviewed his creatinines going back to 2008.  I am not able to determine how committed he is to controlling his CHF.  He has stopped his Spironolactone.  -  Continue Coreg, Hydralazine, Lisinopril, Lasix  -  Continue K+ 10 mEq QD  -  Resume Spironolactone 25 mg QD  -  BMET 1 week.  -  I have asked him to take Hydralazine TID  2. Nonischemic Cardiomyopathy -  Continue ACE inhibitor, beta-blocker, Hydralazine, Spironolactone.    3. S/p AICD - FU with EP as planned.   4. Hypertensive Heart Disease - His NICM is probably related to uncontrolled HTN. Continue beta-blocker, ACE inhibitor, Hydralazine, Spironolactone.  Again, I am not certain he is adherent to his medical therapy.  I have again warned him about the dangers of stroke, worsening CHF, worsening renal function. Adjust medications as noted.    5. CKD - Stage 3. As noted, he did not remember that we have discussed his CKD in the past and how it relates to his HTN, CHF.  I have gone over this again with him.    6. Diabetes - He has still not made an appt with the [facility] to establish primary care.  I again asked him to do this.  With his CKD, I am concerned about him remaining on Metformin. I reviewed this again with him today.        Medication Adjustments/Labs and Tests Ordered: Current medicines are reviewed at length with the patient today.  Concerns regarding medicines are outlined above.  Medication changes, Labs and Tests ordered today are outlined in the Patient Instructions noted below. Patient Instructions  Medication Instructions:  1. RESTART THE SPIRONOLACTONE 25 MG  DAILY  2. CONTINUE TO TAKE THE POTASSIUM 10 MEQ DAILY  3. MAKE SURE YOU ARE TAKING THE HYDRALAZINE EVERY 6 HOURS (THIS IS 3 TIMES A DAY)   Labwork: 04/12/15 BMET; LAB IS OPEN FROM 7:30 AM TO 5 PM  Testing/Procedures: NONE  Follow-Up: DR. Lovena Le 3 MONTHS  Any Other Special Instructions Will Be Listed Below (If Applicable). YOU NEED TO GET AN APPOINTMENT WITH YOUR PRIMARY CARE    If you need a refill on your cardiac medications before your next appointment, please call your pharmacy.    Signed, Richardson Dopp, PA-C  04/05/2015 12:00 PM    [facility] [place], [place],   [address] Phone: [phone]; Fax: [phone]

## 2015-04-05 ENCOUNTER — Encounter: Payer: Self-pay | Admitting: Physician Assistant

## 2015-04-05 ENCOUNTER — Ambulatory Visit (INDEPENDENT_AMBULATORY_CARE_PROVIDER_SITE_OTHER): Payer: Self-pay | Admitting: Physician Assistant

## 2015-04-05 VITALS — BP 160/120 | HR 74 | Ht 70.0 in | Wt 232.0 lb

## 2015-04-05 DIAGNOSIS — I110 Hypertensive heart disease with heart failure: Secondary | ICD-10-CM

## 2015-04-05 DIAGNOSIS — I5022 Chronic systolic (congestive) heart failure: Secondary | ICD-10-CM

## 2015-04-05 DIAGNOSIS — Z95810 Presence of automatic (implantable) cardiac defibrillator: Secondary | ICD-10-CM

## 2015-04-05 DIAGNOSIS — I429 Cardiomyopathy, unspecified: Secondary | ICD-10-CM

## 2015-04-05 DIAGNOSIS — I428 Other cardiomyopathies: Secondary | ICD-10-CM

## 2015-04-05 DIAGNOSIS — N183 Chronic kidney disease, stage 3 (moderate): Secondary | ICD-10-CM

## 2015-04-05 NOTE — Patient Instructions (Signed)
Medication Instructions:  1. RESTART THE SPIRONOLACTONE 25 MG DAILY  2. CONTINUE TO TAKE THE POTASSIUM 10 MEQ DAILY  3. MAKE SURE YOU ARE TAKING THE HYDRALAZINE EVERY 6 HOURS (THIS IS 3 TIMES A DAY)   Labwork: 04/12/15 BMET; LAB IS OPEN FROM 7:30 AM TO 5 PM  Testing/Procedures: NONE  Follow-Up: DR. Lovena Le 3 MONTHS  Any Other Special Instructions Will Be Listed Below (If Applicable). YOU NEED TO GET AN APPOINTMENT WITH YOUR PRIMARY CARE    If you need a refill on your cardiac medications before your next appointment, please call your pharmacy.

## 2015-04-12 ENCOUNTER — Other Ambulatory Visit: Payer: Self-pay

## 2015-06-21 ENCOUNTER — Encounter: Payer: Self-pay | Admitting: Internal Medicine

## 2015-06-22 ENCOUNTER — Encounter: Payer: Self-pay | Admitting: Internal Medicine

## 2015-07-20 ENCOUNTER — Other Ambulatory Visit: Payer: Self-pay | Admitting: Internal Medicine

## 2015-08-13 ENCOUNTER — Ambulatory Visit (INDEPENDENT_AMBULATORY_CARE_PROVIDER_SITE_OTHER): Payer: Self-pay | Admitting: Internal Medicine

## 2015-08-13 ENCOUNTER — Encounter: Payer: Self-pay | Admitting: Internal Medicine

## 2015-08-13 VITALS — BP 178/120 | HR 87 | Ht 70.0 in | Wt 230.0 lb

## 2015-08-13 DIAGNOSIS — Z95810 Presence of automatic (implantable) cardiac defibrillator: Secondary | ICD-10-CM

## 2015-08-13 DIAGNOSIS — I5022 Chronic systolic (congestive) heart failure: Secondary | ICD-10-CM

## 2015-08-13 LAB — CUP PACEART INCLINIC DEVICE CHECK
Battery Remaining Longevity: 91.2
Brady Statistic RV Percent Paced: 0.34 %
HighPow Impedance: 49.1338
Implantable Lead Implant Date: 20090211
Implantable Lead Location: 753860
Implantable Lead Model: 7121
Lead Channel Pacing Threshold Pulse Width: 0.5 ms
Lead Channel Setting Pacing Pulse Width: 0.5 ms
MDC IDC MSMT LEADCHNL RV IMPEDANCE VALUE: 300 Ohm
MDC IDC MSMT LEADCHNL RV PACING THRESHOLD AMPLITUDE: 1 V
MDC IDC MSMT LEADCHNL RV SENSING INTR AMPL: 11.8 mV
MDC IDC PG SERIAL: 7200640
MDC IDC SESS DTM: 20170602114920
MDC IDC SET LEADCHNL RV PACING AMPLITUDE: 2.5 V
MDC IDC SET LEADCHNL RV SENSING SENSITIVITY: 0.5 mV

## 2015-08-13 MED ORDER — LISINOPRIL 40 MG PO TABS: 40.0000 mg | ORAL_TABLET | Freq: Every day | ORAL | Status: DC

## 2015-08-13 NOTE — Progress Notes (Signed)
HPI [name] returns today for followup. He is a very pleasant 45 year old man with chronic systolic heart failure, uncontrolled hypertension, diabetes, status post ICD implantation. In the interim he has done well. He denies chest pain, shortness of breath, or syncope. No peripheral edema. In the interim he has been stable with no sustained ventricular arrhythmias. He denies chest pain. He notes that at times his blood pressure is elevated. 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 He denies non-compliance with his meds but admits to dietary indiscretion. No Known Allergies   Current Outpatient Prescriptions  Medication Sig Dispense Refill  . carvedilol (COREG) 25 MG tablet TAKE ONE TABLET BY MOUTH TWICE DAILY WITH MEALS 60 tablet 7  . furosemide (LASIX) 80 MG tablet Take 1 tablet (80 mg total) by mouth 2 (two) times daily. 180 tablet 3  . hydrALAZINE (APRESOLINE) 50 MG tablet Take 1 tablet (50 mg total) by mouth 3 (three) times daily. 90 tablet 11  . lisinopril (PRINIVIL,ZESTRIL) 40 MG tablet Take 1 tablet (40 mg total) by mouth daily. 90 tablet 3  . metFORMIN (GLUCOPHAGE) 1000 MG tablet Take 1 tablet (1,000 mg total) by mouth 2 (two) times daily with a meal. 60 tablet 6  . potassium chloride (K-DUR) 10 MEQ tablet Take 1 tablet (10 mEq total) by mouth daily. 30 tablet 11  . spironolactone (ALDACTONE) 25 MG tablet Take 1 tablet (25 mg total) by mouth daily. 30 tablet 11   No current facility-administered medications for this visit.     Past Medical History  Diagnosis Date  . Chronic systolic CHF (congestive heart failure) ([place])   . NICM (nonischemic cardiomyopathy) ([place])     a. LHC 6/06: pLAD 20, pLCx 20-30; b. Echo 5/15:  EF 15%, diffuse HK, restrictive physiology, trivial AI, trivial MR, mild LAE, moderate RVE, moderately reduced RVSF, moderate RAE, mild to moderate TR, PASP 43 mmHg  . Hyperlipidemia   . Hypertension   . Obesity   . Diabetes mellitus   .  S/P implantation of automatic cardioverter/defibrillator (AICD)     ROS:   All systems reviewed and negative except as noted in the HPI.   Past Surgical History  Procedure Laterality Date  . Icd  04/24/07; 08-27-2013    [name]. [name] II VR from [place] for nonischemic cardiomyopathy; gen chamge 08-27-2013  . Implantable cardioverter defibrillator (icd) generator change N/A 08/27/2013    Procedure: ICD GENERATOR CHANGE;  Surgeon: Evans Lance, MD;  [facility]: [facility] CATH LAB;  Service: Cardiovascular;  Laterality: N/A;     Family History  Problem Relation Age of Onset  . Hypertension Mother   . Heart disease Mother   . Diabetes Mother      Social History   Social History  . Marital Status: Married    Spouse Name: N/A  . Number of Children: N/A  . Years of Education: N/A   Occupational History  . Not on file.   Social History Main Topics  . Smoking status: Former Smoker    Quit date: 07/28/2002  . Smokeless tobacco: Not on file  . Alcohol Use: No  . Drug Use: No  . Sexual Activity: Not on file   Other Topics Concern  . Not on file   Social History Narrative     BP 178/120 mmHg  Pulse 87  Ht 5\' 10"  (1.778 m)  Wt 230 lb (104.327 kg)  BMI 33.00 kg/m2 Blood pressure was 150/105  by me Physical Exam:  Well appearing NAD HEENT: Unremarkable except for very poor dentition Neck:  7 cm JVD, no thyromegally Lungs:  Clear with no wheezes, rales, or rhonchi. HEART:  Regular rate rhythm, no murmurs, no rubs, no clicks, soft S4 gallop is present Abd:  soft, positive bowel sounds, no organomegally, no rebound, no guarding Ext:  2 plus pulses, no edema, no cyanosis, no clubbing Skin:  No rashes no nodules Neuro:  CN II through XII intact, motor grossly intact  DEVICE  Normal device function.  See PaceArt for details.   Assess/Plan:  1. VT - he had a single episode several months ago for which he had therapy held due to the irregularity of the tachycardia. 2.  Uncontrolled HTN - i have asked the patient to avoid salty food. He states that he will try and do better. 3. Chronic systolic heart failure - his symptoms are class 2A. I have asked him to eat less salt. I considered having him increase his coreg. 4. ICD - his [employer] single chamber ICD is working normally. Will follow.  Mikle Bosworth.D.

## 2015-08-13 NOTE — Patient Instructions (Addendum)
Medication Instructions:  Your physician recommends that you continue on your current medications as directed. Please refer to the Current Medication list given to you today.   Labwork: None ordered   Testing/Procedures: None ordered   Follow-Up: Your physician wants you to follow-up in: 3 months in the [facility] and 12 months with Dr. Lovena Le. You will receive a reminder letter in the mail two months in advance. If you don't receive a letter, please call our office to schedule the follow-up appointment.   Any Other Special Instructions Will Be Listed Below (If Applicable).     If you need a refill on your cardiac medications before your next appointment, please call your pharmacy.

## 2015-10-22 ENCOUNTER — Other Ambulatory Visit: Payer: Self-pay | Admitting: Physician Assistant

## 2015-10-22 DIAGNOSIS — I5023 Acute on chronic systolic (congestive) heart failure: Secondary | ICD-10-CM

## 2016-01-19 ENCOUNTER — Encounter (HOSPITAL_COMMUNITY): Payer: Self-pay

## 2016-01-19 ENCOUNTER — Emergency Department (HOSPITAL_COMMUNITY): Payer: Self-pay

## 2016-01-19 ENCOUNTER — Emergency Department (HOSPITAL_COMMUNITY)
Admission: EM | Admit: 2016-01-19 | Discharge: 2016-01-19 | Disposition: A | Discharge status: Home / Self Care | Payer: Self-pay | Attending: Emergency Medicine | Admitting: Emergency Medicine

## 2016-01-19 DIAGNOSIS — I5022 Chronic systolic (congestive) heart failure: Secondary | ICD-10-CM | POA: Insufficient documentation

## 2016-01-19 DIAGNOSIS — Z7984 Long term (current) use of oral hypoglycemic drugs: Secondary | ICD-10-CM | POA: Insufficient documentation

## 2016-01-19 DIAGNOSIS — Z95810 Presence of automatic (implantable) cardiac defibrillator: Secondary | ICD-10-CM | POA: Insufficient documentation

## 2016-01-19 DIAGNOSIS — Z87891 Personal history of nicotine dependence: Secondary | ICD-10-CM | POA: Insufficient documentation

## 2016-01-19 DIAGNOSIS — N189 Chronic kidney disease, unspecified: Secondary | ICD-10-CM | POA: Insufficient documentation

## 2016-01-19 DIAGNOSIS — I130 Hypertensive heart and chronic kidney disease with heart failure and stage 1 through stage 4 chronic kidney disease, or unspecified chronic kidney disease: Secondary | ICD-10-CM | POA: Insufficient documentation

## 2016-01-19 DIAGNOSIS — M109 Gout, unspecified: Secondary | ICD-10-CM | POA: Insufficient documentation

## 2016-01-19 DIAGNOSIS — E1122 Type 2 diabetes mellitus with diabetic chronic kidney disease: Secondary | ICD-10-CM | POA: Insufficient documentation

## 2016-01-19 MED ORDER — HYDROCODONE-ACETAMINOPHEN 5-325 MG PO TABS: 1.0000 | ORAL_TABLET | ORAL | 0 refills | Status: DC | PRN

## 2016-01-19 MED ORDER — HYDROCODONE-ACETAMINOPHEN 5-325 MG PO TABS
2.0000 | ORAL_TABLET | Freq: Once | ORAL | Status: AC
  Administered 2016-01-19: 2 via ORAL
  Filled 2016-01-19: qty 2

## 2016-01-19 NOTE — ED Provider Notes (Signed)
[facility] DEPT Provider Note   CSN: [id number] Arrival date & time: 01/19/16  2053     History   Chief Complaint Chief Complaint  Patient presents with  . Gout    HPI Travis Bryan is a 45 y.o. male.  HPI  45 year old male with a history of chronic CHF, diabetes, and AICD presents with right foot pain. Started this morning. He states he's had this before that usually goes away with an aspirin. He tried aspirin today with no relief. He has had gout before, he got home he times. However he thinks this is similar to gout. However he also tells me that he thinks it's a little bit worse. Has not had any redness. A type of movement or palpation causes pain. No injuries. Pain is currently severe.  Past Medical History:  Diagnosis Date  . Chronic systolic CHF (congestive heart failure) ([place])   . Diabetes mellitus   . Hyperlipidemia   . Hypertension   . NICM (nonischemic cardiomyopathy) ([place])    a. LHC 6/06: pLAD 20, pLCx 20-30; b. Echo 5/15:  EF 15%, diffuse HK, restrictive physiology, trivial AI, trivial MR, mild LAE, moderate RVE, moderately reduced RVSF, moderate RAE, mild to moderate TR, PASP 43 mmHg  . Obesity   . S/P implantation of automatic cardioverter/defibrillator (AICD)     Patient Active Problem List   Diagnosis Date Noted  . CARDIOMYOPATHY, SECONDARY 07/03/2008  . SYSTOLIC HEART FAILURE, CHRONIC 07/03/2008  . Automatic implantable cardioverter-defibrillator in situ 07/03/2008  . DIABETES MELLITUS, TYPE II 12/26/2005  . DYSLIPIDEMIA 12/26/2005  . OBESITY 12/26/2005  . Essential hypertension 12/26/2005    Past Surgical History:  Procedure Laterality Date  . ICD  04/24/07; 08-27-2013   [name]. [name] II VR from [place] for nonischemic cardiomyopathy; gen chamge 08-27-2013  . IMPLANTABLE CARDIOVERTER DEFIBRILLATOR (ICD) GENERATOR CHANGE N/A 08/27/2013   Procedure: ICD GENERATOR CHANGE;  Surgeon: Evans Lance, MD;  Location: [facility] CATH LAB;  Service:  Cardiovascular;  Laterality: N/A;       Home Medications    Prior to Admission medications   Medication Sig Start Date End Date Taking? Authorizing Provider  carvedilol (COREG) 25 MG tablet TAKE ONE TABLET BY MOUTH TWICE DAILY WITH MEALS Patient taking differently: Take 25 mg by mouth two times a day 07/21/15  Yes Evans Lance, MD  furosemide (LASIX) 80 MG tablet TAKE ONE TABLET BY MOUTH TWICE DAILY Patient taking differently: Take 80 mg by mouth two times a day 10/22/15  Yes Morganne Haile T Kathlen Mody, PA-C  hydrALAZINE (APRESOLINE) 50 MG tablet Take 1 tablet (50 mg total) by mouth 3 (three) times daily. 03/03/15  Yes Trenae Brunke T Kathlen Mody, PA-C  lisinopril (PRINIVIL,ZESTRIL) 40 MG tablet Take 1 tablet (40 mg total) by mouth daily. 08/13/15  Yes Evans Lance, MD  metFORMIN (GLUCOPHAGE) 1000 MG tablet Take 1 tablet (1,000 mg total) by mouth 2 (two) times daily with a meal. 10/25/11  Yes Evans Lance, MD  spironolactone (ALDACTONE) 25 MG tablet Take 1 tablet (25 mg total) by mouth daily. 03/05/15  Yes Liliane Shi, PA-C  HYDROcodone-acetaminophen (NORCO) 5-325 MG tablet Take 1 tablet by mouth every 4 (four) hours as needed for severe pain. 01/19/16   Sherwood Gambler, MD  potassium chloride (K-DUR) 10 MEQ tablet Take 1 tablet (10 mEq total) by mouth daily. Patient not taking: Reported on 01/19/2016 03/17/15   Liliane Shi, PA-C    Family History Family History  Problem Relation Age of  Onset  . Hypertension Mother   . Heart disease Mother   . Diabetes Mother     Social History Social History  Substance Use Topics  . Smoking status: Former Smoker    Quit date: 07/28/2002  . Smokeless tobacco: Not on file  . Alcohol use No     Allergies   Patient has no known allergies.   Review of Systems Review of Systems  Constitutional: Negative for fever.  Musculoskeletal: Positive for arthralgias and joint swelling.  Skin: Negative for color change.  Neurological: Negative for numbness.  All other  systems reviewed and are negative.    Physical Exam Updated Vital Signs BP 133/85   Pulse 102   Temp 98.5 F (36.9 C) (Oral)   Resp 20   Ht 5\' 10"  (1.778 m)   Wt 230 lb (104.3 kg)   SpO2 93%   BMI 33.00 kg/m   Physical Exam  Constitutional: He is oriented to person, place, and time. He appears well-developed and well-nourished. No distress.  Obese, no acute distress  HENT:  Head: Normocephalic and atraumatic.  Right Ear: External ear normal.  Left Ear: External ear normal.  Nose: Nose normal.  Eyes: Right eye exhibits no discharge. Left eye exhibits no discharge.  Cardiovascular: Normal rate and regular rhythm.   Pulses:      Dorsalis pedis pulses are 2+ on the right side.  Pulmonary/Chest: Effort normal.  Musculoskeletal: He exhibits no edema.       Right foot: There is decreased range of motion, tenderness and swelling.       Feet:  Neurological: He is alert and oriented to person, place, and time.  Skin: Skin is warm and dry. He is not diaphoretic.  Nursing note and vitals reviewed.    ED Treatments / Results  Labs (all labs ordered are listed, but only abnormal results are displayed) Labs Reviewed - No data to display  EKG  EKG Interpretation None       Radiology Dg Foot Complete Right  Result Date: 01/19/2016 CLINICAL DATA:  Right great toe pain and swelling, onset today. History of diabetes and gout. No known injury. EXAM: RIGHT FOOT COMPLETE - 3+ VIEW COMPARISON:  None. FINDINGS: There is mild generalized diffuse soft tissue swelling about the foot with plantar and dorsal enthesophytes of the calcaneus. No extra articular erosions nor soft tissue tophi are apparent. No acute displaced fracture is seen. IMPRESSION: Generalized soft tissue swelling right foot. Calcaneal enthesophytes. No acute osseous abnormality nor frank bone destruction. Electronically Signed   By: [place] Royalty M.D.   On: 01/19/2016 22:33    Procedures Procedures (including critical  care time)  Medications Ordered in ED Medications  HYDROcodone-acetaminophen (NORCO/VICODIN) 5-325 MG per tablet 2 tablet (2 tablets Oral Given 01/19/16 2210)     Initial Impression / Assessment and Plan / ED Course  I have reviewed the triage vital signs and the nursing notes.  Pertinent labs & imaging results that were available during my care of the patient were reviewed by me and considered in my medical decision making (see chart for details).  Clinical Course as of Jan 19 2243  Wed Jan 19, 2016  2206 This is probably gout but he states it's both similar and different than other episodes. Will get xray, give hydrocodone, re-assess.  [SG]    Clinical Course User Index [SG] Sherwood Gambler, MD    X-rays unremarkable. This is most likely gout. My suspicion for joint infection is quite low. Given  his comorbidities including CHF and chronic kidney disease (typical creatinine around 1.4 or 1.5) as well as diabetes I think NSAIDs, prednisone, and colchicine would be unwise. Thus we'll give hydrocodone for pain and let this resolve on its own. Follow-up with PCP for better outpatient management.  Final Clinical Impressions(s) / ED Diagnoses   Final diagnoses:  Acute gout involving toe of right foot, unspecified cause    New Prescriptions New Prescriptions   HYDROCODONE-ACETAMINOPHEN (NORCO) 5-325 MG TABLET    Take 1 tablet by mouth every 4 (four) hours as needed for severe pain.     Sherwood Gambler, MD 01/19/16 [phone]

## 2016-01-19 NOTE — ED Notes (Signed)
Pt stable, ambulatory, states understanding of discharge instructions 

## 2016-01-19 NOTE — ED Triage Notes (Signed)
Pt states that R foot stated hurting and has some mild swelling around his big toe that started today, denies injury, pt has a hx of gout

## 2016-03-08 ENCOUNTER — Other Ambulatory Visit: Payer: Self-pay | Admitting: Physician Assistant

## 2016-03-08 DIAGNOSIS — I5023 Acute on chronic systolic (congestive) heart failure: Secondary | ICD-10-CM

## 2016-03-28 ENCOUNTER — Inpatient Hospital Stay (HOSPITAL_COMMUNITY): Payer: BLUE CROSS/BLUE SHIELD

## 2016-03-28 ENCOUNTER — Emergency Department (HOSPITAL_COMMUNITY): Payer: BLUE CROSS/BLUE SHIELD

## 2016-03-28 ENCOUNTER — Inpatient Hospital Stay (HOSPITAL_COMMUNITY)
Admission: EM | Admit: 2016-03-28 | Discharge: 2016-04-14 | DRG: 273 | Disposition: A | Discharge status: Home / Self Care | Payer: BLUE CROSS/BLUE SHIELD | Attending: Cardiovascular Disease | Admitting: Cardiovascular Disease

## 2016-03-28 ENCOUNTER — Encounter (HOSPITAL_COMMUNITY): Payer: Self-pay

## 2016-03-28 DIAGNOSIS — E871 Hypo-osmolality and hyponatremia: Secondary | ICD-10-CM | POA: Diagnosis not present

## 2016-03-28 DIAGNOSIS — N170 Acute kidney failure with tubular necrosis: Secondary | ICD-10-CM | POA: Diagnosis not present

## 2016-03-28 DIAGNOSIS — I5023 Acute on chronic systolic (congestive) heart failure: Secondary | ICD-10-CM

## 2016-03-28 DIAGNOSIS — E785 Hyperlipidemia, unspecified: Secondary | ICD-10-CM | POA: Diagnosis present

## 2016-03-28 DIAGNOSIS — E1165 Type 2 diabetes mellitus with hyperglycemia: Secondary | ICD-10-CM | POA: Diagnosis present

## 2016-03-28 DIAGNOSIS — Z794 Long term (current) use of insulin: Secondary | ICD-10-CM

## 2016-03-28 DIAGNOSIS — I472 Ventricular tachycardia: Secondary | ICD-10-CM | POA: Diagnosis present

## 2016-03-28 DIAGNOSIS — I509 Heart failure, unspecified: Secondary | ICD-10-CM

## 2016-03-28 DIAGNOSIS — Z8249 Family history of ischemic heart disease and other diseases of the circulatory system: Secondary | ICD-10-CM

## 2016-03-28 DIAGNOSIS — E876 Hypokalemia: Secondary | ICD-10-CM

## 2016-03-28 DIAGNOSIS — Z833 Family history of diabetes mellitus: Secondary | ICD-10-CM | POA: Diagnosis not present

## 2016-03-28 DIAGNOSIS — I252 Old myocardial infarction: Secondary | ICD-10-CM | POA: Diagnosis not present

## 2016-03-28 DIAGNOSIS — I4891 Unspecified atrial fibrillation: Secondary | ICD-10-CM | POA: Diagnosis not present

## 2016-03-28 DIAGNOSIS — Z9114 Patient's other noncompliance with medication regimen: Secondary | ICD-10-CM | POA: Diagnosis not present

## 2016-03-28 DIAGNOSIS — E1122 Type 2 diabetes mellitus with diabetic chronic kidney disease: Secondary | ICD-10-CM | POA: Diagnosis not present

## 2016-03-28 DIAGNOSIS — I429 Cardiomyopathy, unspecified: Secondary | ICD-10-CM

## 2016-03-28 DIAGNOSIS — I4901 Ventricular fibrillation: Secondary | ICD-10-CM

## 2016-03-28 DIAGNOSIS — Z79899 Other long term (current) drug therapy: Secondary | ICD-10-CM | POA: Diagnosis not present

## 2016-03-28 DIAGNOSIS — R739 Hyperglycemia, unspecified: Secondary | ICD-10-CM

## 2016-03-28 DIAGNOSIS — I5022 Chronic systolic (congestive) heart failure: Secondary | ICD-10-CM

## 2016-03-28 DIAGNOSIS — Z95810 Presence of automatic (implantable) cardiac defibrillator: Secondary | ICD-10-CM | POA: Diagnosis not present

## 2016-03-28 DIAGNOSIS — Z7901 Long term (current) use of anticoagulants: Secondary | ICD-10-CM

## 2016-03-28 DIAGNOSIS — R0602 Shortness of breath: Secondary | ICD-10-CM

## 2016-03-28 DIAGNOSIS — R570 Cardiogenic shock: Secondary | ICD-10-CM | POA: Diagnosis not present

## 2016-03-28 DIAGNOSIS — N179 Acute kidney failure, unspecified: Secondary | ICD-10-CM

## 2016-03-28 DIAGNOSIS — R0600 Dyspnea, unspecified: Secondary | ICD-10-CM | POA: Diagnosis not present

## 2016-03-28 DIAGNOSIS — I2510 Atherosclerotic heart disease of native coronary artery without angina pectoris: Secondary | ICD-10-CM | POA: Diagnosis present

## 2016-03-28 DIAGNOSIS — Z87891 Personal history of nicotine dependence: Secondary | ICD-10-CM

## 2016-03-28 DIAGNOSIS — N184 Chronic kidney disease, stage 4 (severe): Secondary | ICD-10-CM | POA: Diagnosis not present

## 2016-03-28 DIAGNOSIS — I483 Typical atrial flutter: Secondary | ICD-10-CM | POA: Diagnosis not present

## 2016-03-28 DIAGNOSIS — I130 Hypertensive heart and chronic kidney disease with heart failure and stage 1 through stage 4 chronic kidney disease, or unspecified chronic kidney disease: Secondary | ICD-10-CM | POA: Diagnosis not present

## 2016-03-28 DIAGNOSIS — I499 Cardiac arrhythmia, unspecified: Secondary | ICD-10-CM

## 2016-03-28 DIAGNOSIS — Z7982 Long term (current) use of aspirin: Secondary | ICD-10-CM

## 2016-03-28 DIAGNOSIS — Z9111 Patient's noncompliance with dietary regimen: Secondary | ICD-10-CM | POA: Diagnosis not present

## 2016-03-28 DIAGNOSIS — I447 Left bundle-branch block, unspecified: Secondary | ICD-10-CM

## 2016-03-28 DIAGNOSIS — I4819 Other persistent atrial fibrillation: Secondary | ICD-10-CM

## 2016-03-28 HISTORY — DX: Acute myocardial infarction, unspecified: I21.9

## 2016-03-28 HISTORY — DX: Type 2 diabetes mellitus without complications: E11.9

## 2016-03-28 HISTORY — DX: Personal history of other diseases of the musculoskeletal system and connective tissue: Z87.39

## 2016-03-28 HISTORY — DX: Presence of automatic (implantable) cardiac defibrillator: Z95.810

## 2016-03-28 HISTORY — DX: Other persistent atrial fibrillation: I48.19

## 2016-03-28 HISTORY — DX: Unspecified atrial flutter: I48.92

## 2016-03-28 LAB — ECHOCARDIOGRAM COMPLETE: WEIGHTICAEL: 3707.26 [oz_av]

## 2016-03-28 LAB — BRAIN NATRIURETIC PEPTIDE: B NATRIURETIC PEPTIDE 5: 1431.5 pg/mL — AB (ref 0.0–100.0)

## 2016-03-28 LAB — CBC WITH DIFFERENTIAL/PLATELET
BASOS ABS: 0 10*3/uL (ref 0.0–0.1)
Basophils Relative: 0 %
EOS PCT: 1 %
Eosinophils Absolute: 0.1 10*3/uL (ref 0.0–0.7)
HEMATOCRIT: 40.9 % (ref 39.0–52.0)
Hemoglobin: 13.9 g/dL (ref 13.0–17.0)
LYMPHS PCT: 24 %
Lymphs Abs: 1.7 10*3/uL (ref 0.7–4.0)
MCH: 30.2 pg (ref 26.0–34.0)
MCHC: 34 g/dL (ref 30.0–36.0)
MCV: 88.9 fL (ref 78.0–100.0)
MONOS PCT: 7 %
Monocytes Absolute: 0.5 10*3/uL (ref 0.1–1.0)
Neutro Abs: 4.8 10*3/uL (ref 1.7–7.7)
Neutrophils Relative %: 68 %
PLATELETS: 243 10*3/uL (ref 150–400)
RBC: 4.6 MIL/uL (ref 4.22–5.81)
RDW: 13.7 % (ref 11.5–15.5)
WBC: 7 10*3/uL (ref 4.0–10.5)

## 2016-03-28 LAB — COMPREHENSIVE METABOLIC PANEL
ALT: 10 U/L — ABNORMAL LOW (ref 17–63)
ANION GAP: 14 (ref 5–15)
AST: 14 U/L — AB (ref 15–41)
Albumin: 3 g/dL — ABNORMAL LOW (ref 3.5–5.0)
Alkaline Phosphatase: 124 U/L (ref 38–126)
BILIRUBIN TOTAL: 3 mg/dL — AB (ref 0.3–1.2)
BUN: 18 mg/dL (ref 6–20)
CHLORIDE: 87 mmol/L — AB (ref 101–111)
CO2: 28 mmol/L (ref 22–32)
Calcium: 8.8 mg/dL — ABNORMAL LOW (ref 8.9–10.3)
Creatinine, Ser: 2.24 mg/dL — ABNORMAL HIGH (ref 0.61–1.24)
GFR, EST AFRICAN AMERICAN: 39 mL/min — AB (ref >60)
GFR, EST NON AFRICAN AMERICAN: 34 mL/min — AB (ref >60)
Glucose, Bld: 529 mg/dL (ref 65–99)
POTASSIUM: 2.7 mmol/L — AB (ref 3.5–5.1)
Sodium: 129 mmol/L — ABNORMAL LOW (ref 135–145)
TOTAL PROTEIN: 7 g/dL (ref 6.5–8.1)

## 2016-03-28 LAB — GLUCOSE, CAPILLARY
GLUCOSE-CAPILLARY: 316 mg/dL — AB (ref 65–99)
Glucose-Capillary: 252 mg/dL — ABNORMAL HIGH (ref 65–99)
Glucose-Capillary: 259 mg/dL — ABNORMAL HIGH (ref 65–99)

## 2016-03-28 LAB — MAGNESIUM: MAGNESIUM: 1.6 mg/dL — AB (ref 1.7–2.4)

## 2016-03-28 LAB — TROPONIN I: TROPONIN I: 0.05 ng/mL — AB (ref <0.03)

## 2016-03-28 MED ORDER — FUROSEMIDE 10 MG/ML IJ SOLN
80.0000 mg | Freq: Once | INTRAMUSCULAR | Status: DC
Start: 2016-03-28 — End: 2016-03-28

## 2016-03-28 MED ORDER — FUROSEMIDE 10 MG/ML IJ SOLN
80.0000 mg | Freq: Two times a day (BID) | INTRAMUSCULAR | Status: DC
  Administered 2016-03-29 – 2016-04-01 (×7): 80 mg via INTRAVENOUS
  Filled 2016-03-28 (×10): qty 8

## 2016-03-28 MED ORDER — CARVEDILOL 25 MG PO TABS
25.0000 mg | ORAL_TABLET | Freq: Two times a day (BID) | ORAL | Status: DC
  Administered 2016-03-28 – 2016-04-01 (×7): 25 mg via ORAL
  Filled 2016-03-28 (×7): qty 1

## 2016-03-28 MED ORDER — AMIODARONE HCL 200 MG PO TABS
400.0000 mg | ORAL_TABLET | Freq: Two times a day (BID) | ORAL | Status: DC
  Administered 2016-03-28: 400 mg via ORAL
  Filled 2016-03-28 (×2): qty 2

## 2016-03-28 MED ORDER — POTASSIUM CHLORIDE CRYS ER 20 MEQ PO TBCR: 40.0000 meq | EXTENDED_RELEASE_TABLET | Freq: Once | ORAL | Status: DC

## 2016-03-28 MED ORDER — INSULIN ASPART 100 UNIT/ML ~~LOC~~ SOLN
0.0000 [IU] | Freq: Three times a day (TID) | SUBCUTANEOUS | Status: DC
  Administered 2016-03-28: 11 [IU] via SUBCUTANEOUS
  Administered 2016-03-29: 15 [IU] via SUBCUTANEOUS
  Administered 2016-03-29: 4 [IU] via SUBCUTANEOUS
  Administered 2016-03-30: 7 [IU] via SUBCUTANEOUS
  Administered 2016-03-30: 11 [IU] via SUBCUTANEOUS
  Administered 2016-03-31 – 2016-04-01 (×3): 4 [IU] via SUBCUTANEOUS
  Administered 2016-04-01 – 2016-04-02 (×3): 7 [IU] via SUBCUTANEOUS
  Administered 2016-04-03: 11 [IU] via SUBCUTANEOUS
  Administered 2016-04-03 (×2): 4 [IU] via SUBCUTANEOUS
  Administered 2016-04-04: 3 [IU] via SUBCUTANEOUS
  Administered 2016-04-04: 4 [IU] via SUBCUTANEOUS
  Administered 2016-04-05: 3 [IU] via SUBCUTANEOUS
  Administered 2016-04-05 – 2016-04-07 (×4): 4 [IU] via SUBCUTANEOUS
  Administered 2016-04-08: 3 [IU] via SUBCUTANEOUS
  Administered 2016-04-09 – 2016-04-10 (×2): 7 [IU] via SUBCUTANEOUS
  Administered 2016-04-10 (×2): 3 [IU] via SUBCUTANEOUS
  Administered 2016-04-11: 7 [IU] via SUBCUTANEOUS
  Administered 2016-04-13 (×2): 3 [IU] via SUBCUTANEOUS

## 2016-03-28 MED ORDER — HYDRALAZINE HCL 50 MG PO TABS
50.0000 mg | ORAL_TABLET | Freq: Three times a day (TID) | ORAL | Status: DC
  Administered 2016-03-28 – 2016-03-30 (×8): 50 mg via ORAL
  Filled 2016-03-28 (×9): qty 1

## 2016-03-28 MED ORDER — POTASSIUM CHLORIDE CRYS ER 20 MEQ PO TBCR
40.0000 meq | EXTENDED_RELEASE_TABLET | Freq: Once | ORAL | Status: AC
  Administered 2016-03-28: 40 meq via ORAL
  Filled 2016-03-28: qty 2

## 2016-03-28 MED ORDER — SPIRONOLACTONE 25 MG PO TABS
25.0000 mg | ORAL_TABLET | Freq: Every day | ORAL | Status: DC
  Administered 2016-03-28 – 2016-04-01 (×5): 25 mg via ORAL
  Filled 2016-03-28 (×5): qty 1

## 2016-03-28 MED ORDER — ASPIRIN EC 81 MG PO TBEC
81.0000 mg | DELAYED_RELEASE_TABLET | Freq: Every day | ORAL | Status: DC
  Administered 2016-03-28: 81 mg via ORAL
  Filled 2016-03-28 (×2): qty 1

## 2016-03-28 MED ORDER — INSULIN ASPART 100 UNIT/ML ~~LOC~~ SOLN
5.0000 [IU] | Freq: Once | SUBCUTANEOUS | Status: AC
  Administered 2016-03-28: 5 [IU] via SUBCUTANEOUS

## 2016-03-28 MED ORDER — SODIUM CHLORIDE 0.9 % IV SOLN
30.0000 meq | Freq: Once | INTRAVENOUS | Status: AC
  Administered 2016-03-28: 30 meq via INTRAVENOUS
  Filled 2016-03-28: qty 15

## 2016-03-28 MED ORDER — POTASSIUM CHLORIDE CRYS ER 20 MEQ PO TBCR
40.0000 meq | EXTENDED_RELEASE_TABLET | Freq: Two times a day (BID) | ORAL | Status: DC
  Administered 2016-03-28 – 2016-03-31 (×6): 40 meq via ORAL
  Filled 2016-03-28 (×6): qty 2

## 2016-03-28 MED ORDER — ACETAMINOPHEN 325 MG PO TABS
650.0000 mg | ORAL_TABLET | ORAL | Status: DC | PRN
  Administered 2016-04-01 – 2016-04-02 (×2): 650 mg via ORAL
  Filled 2016-03-28 (×2): qty 2

## 2016-03-28 MED ORDER — ONDANSETRON HCL 4 MG/2ML IJ SOLN
4.0000 mg | Freq: Four times a day (QID) | INTRAMUSCULAR | Status: DC | PRN
  Administered 2016-04-02: 4 mg via INTRAVENOUS
  Filled 2016-03-28: qty 2

## 2016-03-28 MED ORDER — SODIUM CHLORIDE 0.9 % IV BOLUS (SEPSIS)
500.0000 mL | Freq: Once | INTRAVENOUS | Status: AC
  Administered 2016-03-28: 500 mL via INTRAVENOUS

## 2016-03-28 MED ORDER — INSULIN ASPART 100 UNIT/ML ~~LOC~~ SOLN
15.0000 [IU] | Freq: Once | SUBCUTANEOUS | Status: AC
  Administered 2016-03-28: 15 [IU] via SUBCUTANEOUS
  Filled 2016-03-28: qty 1

## 2016-03-28 MED ORDER — HEPARIN SODIUM (PORCINE) 5000 UNIT/ML IJ SOLN
5000.0000 [IU] | Freq: Three times a day (TID) | INTRAMUSCULAR | Status: DC
  Filled 2016-03-28: qty 1

## 2016-03-28 MED ORDER — LISINOPRIL 40 MG PO TABS
40.0000 mg | ORAL_TABLET | Freq: Every day | ORAL | Status: DC
  Administered 2016-03-28 – 2016-03-30 (×3): 40 mg via ORAL
  Filled 2016-03-28 (×3): qty 1

## 2016-03-28 NOTE — Progress Notes (Signed)
Pt. Educated about safety and bed alarm during the night, however, he refuses to be on bed alarm during the night. Will continue to monitor.   Cranston Koors, RN

## 2016-03-28 NOTE — ED Provider Notes (Signed)
Patient presented to the ER with dyspnea on exertion.  Face to face Exam: HEENT - PERRLA Lungs - CTAB Heart - RRR, s3 gallop, +JVD, bilat LE edema Abd - S/NT/ND Neuro - alert, oriented x3  Plan: Interrogation of AICD reveals that he did receive a shock for V. fib 2 days ago. Workup today suspicious for congestive heart failure.   Orpah Greek, MD 03/28/16 [phone]

## 2016-03-28 NOTE — ED Notes (Signed)
Placed pt on 2 L [place] as his O2 was dropping when he slept.

## 2016-03-28 NOTE — ED Notes (Signed)
[employer] called to report that pt did receive a shock on 03/26/16 at 7:50pm due to his heart beating 315bpm which did return it to normal.  Fax to follow.  Informed Dr. Betsey Holiday and Aniceto Boss.

## 2016-03-28 NOTE — Progress Notes (Signed)
  Echocardiogram 2D Echocardiogram has been performed.  Tresa Res 03/28/2016, 4:54 PM

## 2016-03-28 NOTE — Progress Notes (Addendum)
Pt is alert and oriented Male calm and pleasant stated he is from home with Family. Drove himself to the ED d/y having SOB and left Knee swelling. Sat 93 on 2 l of 02 plan to give IV lasix and monitor electrolytes. And Blood sugar  Patient states he needs a PCP

## 2016-03-28 NOTE — ED Notes (Signed)
Ordered meal tray. 

## 2016-03-28 NOTE — H&P (Signed)
Cardiology H&P    Patient ID: Kristoph Sattler MRN: [id number], DOB/AGE: December 12, 1970   Admit date: 03/28/2016 Date of Consult: 03/28/2016  Primary Physician: No PCP Per Patient  Primary Cardiologist: Dr. Lovena Le   Patient Profile    Hermon Zea is a 46 y.o. male with a hx of chronic systolic CHF, NICM (EF 37%) s/p ICD, HTN, diabetes, HL.  History of Present Illness    Mr. Karas presented today to the ED with a host of vague complaints. He tells me that he feels like he has fluid in his chest. He denies orthopnea, PND, and cough. He does have some exertional dyspnea, saying he had to stop while walking from his house to his car.   His ICD was interrogated and he did receive a shock for VF on 03/26/16 at 7:51pm. He has no recollection of this.   He was last seen in our office by Richardson Dopp one year ago. He was volume overloaded and it was noted that he had intermittent compliance with his medications due to cost.   Today, he tells me that he has eaten fast food this week and had a high salt meal last night. His BNP is 1431. Also he is hypokalemic (K is 2.7) and being repleted.   Past Medical History   Past Medical History:  Diagnosis Date  . Chronic systolic CHF (congestive heart failure) ([place])   . Diabetes mellitus   . Hyperlipidemia   . Hypertension   . NICM (nonischemic cardiomyopathy) ([place])    a. LHC 6/06: pLAD 20, pLCx 20-30; b. Echo 5/15:  EF 15%, diffuse HK, restrictive physiology, trivial AI, trivial MR, mild LAE, moderate RVE, moderately reduced RVSF, moderate RAE, mild to moderate TR, PASP 43 mmHg  . Obesity   . S/P implantation of automatic cardioverter/defibrillator (AICD)     Past Surgical History:  Procedure Laterality Date  . ICD  04/24/07; 08-27-2013   [name]. [name] II VR from [place] for nonischemic cardiomyopathy; gen chamge 08-27-2013  . IMPLANTABLE CARDIOVERTER DEFIBRILLATOR (ICD) GENERATOR CHANGE N/A 08/27/2013   Procedure: ICD GENERATOR CHANGE;   Surgeon: Evans Lance, MD;  Location: [facility] CATH LAB;  Service: Cardiovascular;  Laterality: N/A;     Allergies  No Known Allergies  Inpatient Medications    . furosemide  80 mg Intravenous Once  . potassium chloride (KCL MULTIRUN) 30 mEq in 265 mL IVPB  30 mEq Intravenous Once  . potassium chloride  40 mEq Oral Once    Family History    Family History  Problem Relation Age of Onset  . Hypertension Mother   . Heart disease Mother   . Diabetes Mother     Social History    Social History   Social History  . Marital status: Married    Spouse name: N/A  . Number of children: N/A  . Years of education: N/A   Occupational History  . Not on file.   Social History Main Topics  . Smoking status: Former Smoker    Quit date: 07/28/2002  . Smokeless tobacco: Never Used  . Alcohol use No  . Drug use: No  . Sexual activity: Not on file   Other Topics Concern  . Not on file   Social History Narrative  . No narrative on file     Review of Systems    General:  No chills, fever, night sweats or weight changes.  Cardiovascular:  No chest pain, + dyspnea on exertion, edema, orthopnea,  palpitations, paroxysmal nocturnal dyspnea. Dermatological: No rash, lesions/masses Respiratory: No cough, dyspnea Urologic: No hematuria, dysuria Abdominal:   No nausea, vomiting, diarrhea, bright red blood per rectum, melena, or hematemesis Neurologic:  No visual changes, wkns, changes in mental status. All other systems reviewed and are otherwise negative except as noted above.  Physical Exam    Blood pressure 125/87, pulse 111, temperature 98.3 F (36.8 C), temperature source Oral, resp. rate (!) 27, SpO2 94 %.  General: Pleasant, NAD Psych: Normal affect. Neuro: Alert and oriented X 3. Moves all extremities spontaneously. HEENT: Normal  Neck: Supple without bruits. JVD 6-7 cm above clavicle at 45 degrees.  Lungs:  Resp regular and unlabored, diminished in bases.  Heart: RRR no s3,  s4, or murmurs. Abdomen: Soft, non-tender, non-distended, BS + x 4.  Extremities: No clubbing, cyanosis or edema. DP/PT/Radials 2+ and equal bilaterally.  Labs     Recent Labs  03/28/16 0407  TROPONINI 0.05*   Lab Results  Component Value Date   WBC 7.0 03/28/2016   HGB 13.9 03/28/2016   HCT 40.9 03/28/2016   MCV 88.9 03/28/2016   PLT 243 03/28/2016    Recent Labs Lab 03/28/16 0407  NA 129*  K 2.7*  CL 87*  CO2 28  BUN 18  CREATININE 2.24*  CALCIUM 8.8*  PROT 7.0  BILITOT 3.0*  ALKPHOS 124  ALT 10*  AST 14*  GLUCOSE 529*   Lab Results  Component Value Date   CHOL 196 04/13/2009   HDL 39 (L) 04/13/2009   LDLCALC 136 (H) 04/13/2009   TRIG 107 04/13/2009   No results found for: [facility]   Radiology Studies    Dg Chest 2 View  Result Date: 03/28/2016 CLINICAL DATA:  Shortness of breath EXAM: CHEST  2 VIEW COMPARISON:  04/25/2007 FINDINGS: Left-sided single lead pacing device with lead over right ventricle. Moderate cardiomegaly without overt failure. No acute infiltrate, consolidation, or pleural effusion. No pneumothorax peer IMPRESSION: Cardiomegaly without overt failure.  No acute infiltrate Electronically Signed   By: Donavan Foil M.D.   On: 03/28/2016 01:55    EKG & Cardiac Imaging    EKG: LBBB, new when compared to old EKG's.   Assessment & Plan    1. Acute on chronic systolic CHF: BNP elevated and exam consistent with volume overload. Will diurese with IV Lasix, 120mg  BID.   Continue Coreg, hydralazine, lisinopril, spiro.   Impedence high today, consistent with clinical picture.   2. Nonischemic cardiomyopathy: Repeat Echo this admission.   3. DM: Glucose > 500, Gap is closed. Will put on resistant sliding scale.   4. Vfib: s/p ICD shock. Coreg at max dose. Consider starting Amiodarone. Will get EP to see.   5. LBBB on EKG: last heart cath was in 2006:  CORONARY ANGIOGRAPHY:  1.  The left main coronary artery was double barreled.  2.  The  left anterior descending coronary artery had 20% proximal lesion.      The diagonal #1 was unremarkable, and diagonal #2, also was a small      vessel.  3.  The left circumflex coronary artery was dominant, had proximal 20 to 30%      lesion.  A ramus branch was a very small vessel, obtuse marginal branch      #1 and #2 were slender vessels, and posterior descending coronary artery      was unremarkable.  4.  The right coronary artery was nondominant with slender distal vessels  with spasm, otherwise, unremarkable  LBBB could be rate related. Question whether he needs ischemic evaluation.   Signed, Arbutus Leas, NP 03/28/2016, 9:16 AM Pager: [phone]  Agree with note by Jettie Booze NP  Pt of Dr Elliot Cousin with H/O nonischemic cardiomyopathy status post cardiac catheterization in 2006. He does have an ICD. He is being admitted with symptoms of volume overload and low output. He admits to medication noncompliance as well as dietary noncompliance. He does have rales on exam. He has no peripheral edema. He has a summation gallop. His EKG shows sinus rhythm, sinus tachycardia with nonspecific IVCD. Interrogation of his ICD reveals VF with successful ICD treatment on the 14th. We will admit him for IV diuresis and have the heart failure/EP service evaluate as well.  Lorretta Harp, M.D., [place], [facility], [place] [address] [place], [place]  [address]  [phone] 03/28/2016 1:00 PM

## 2016-03-28 NOTE — Progress Notes (Addendum)
Insulin Aspart 5 units ordered for bedtime.Thereasa Distance order given per cardiology Fudim.   Will continue to monitor.   Benett Swoyer, RN

## 2016-03-28 NOTE — ED Notes (Signed)
Informed MD of critical labs

## 2016-03-28 NOTE — ED Notes (Signed)
Cardiology at bedside.

## 2016-03-28 NOTE — ED Provider Notes (Signed)
[facility] DEPT Provider Note   CSN: [id number] Arrival date & time: 03/28/16  0110     History   Chief Complaint Chief Complaint  Patient presents with  . Knee Pain  . Cough  . Tachycardia    HPI Travis Bryan is a 46 y.o. male.  Patient with a history of T2DM, HTN, NICM, systolic heart failure, ICD placement, presents with complaint of left knee pain and swelling, but also c/o SOB with exertion over the last several days. He denies chest pain, cough, fever, vomiting. He reports his legs swell intermittently. He denies that he has felt any defibrillator shocks. No syncope or pre-syncope. He states his left knee has been swollen and painful for several days without injury.       Past Medical History:  Diagnosis Date  . Chronic systolic CHF (congestive heart failure) ([place])   . Diabetes mellitus   . Hyperlipidemia   . Hypertension   . NICM (nonischemic cardiomyopathy) ([place])    a. LHC 6/06: pLAD 20, pLCx 20-30; b. Echo 5/15:  EF 15%, diffuse HK, restrictive physiology, trivial AI, trivial MR, mild LAE, moderate RVE, moderately reduced RVSF, moderate RAE, mild to moderate TR, PASP 43 mmHg  . Obesity   . S/P implantation of automatic cardioverter/defibrillator (AICD)     Patient Active Problem List   Diagnosis Date Noted  . CARDIOMYOPATHY, SECONDARY 07/03/2008  . SYSTOLIC HEART FAILURE, CHRONIC 07/03/2008  . Automatic implantable cardioverter-defibrillator in situ 07/03/2008  . DIABETES MELLITUS, TYPE II 12/26/2005  . DYSLIPIDEMIA 12/26/2005  . OBESITY 12/26/2005  . Essential hypertension 12/26/2005    Past Surgical History:  Procedure Laterality Date  . ICD  04/24/07; 08-27-2013   [name]. [name] II VR from [place] for nonischemic cardiomyopathy; gen chamge 08-27-2013  . IMPLANTABLE CARDIOVERTER DEFIBRILLATOR (ICD) GENERATOR CHANGE N/A 08/27/2013   Procedure: ICD GENERATOR CHANGE;  Surgeon: Evans Lance, MD;  Location: [facility] CATH LAB;  Service: Cardiovascular;   Laterality: N/A;       Home Medications    Prior to Admission medications   Medication Sig Start Date End Date Taking? Authorizing Provider  carvedilol (COREG) 25 MG tablet TAKE ONE TABLET BY MOUTH TWICE DAILY WITH MEALS Patient taking differently: Take 25 mg by mouth two times a day 07/21/15  Yes Evans Lance, MD  furosemide (LASIX) 80 MG tablet TAKE ONE TABLET BY MOUTH TWICE DAILY Patient taking differently: Take 80 mg by mouth two times a day 10/22/15  Yes Scott T Kathlen Mody, PA-C  hydrALAZINE (APRESOLINE) 50 MG tablet TAKE ONE TABLET BY MOUTH THREE TIMES DAILY 03/08/16  Yes Liliane Shi, PA-C  lisinopril (PRINIVIL,ZESTRIL) 40 MG tablet Take 1 tablet (40 mg total) by mouth daily. 08/13/15  Yes Evans Lance, MD  metFORMIN (GLUCOPHAGE) 1000 MG tablet Take 1 tablet (1,000 mg total) by mouth 2 (two) times daily with a meal. 10/25/11  Yes Evans Lance, MD  spironolactone (ALDACTONE) 25 MG tablet Take 1 tablet (25 mg total) by mouth daily. 03/05/15  Yes Liliane Shi, PA-C  HYDROcodone-acetaminophen (NORCO) 5-325 MG tablet Take 1 tablet by mouth every 4 (four) hours as needed for severe pain. Patient not taking: Reported on 03/28/2016 01/19/16   Sherwood Gambler, MD  potassium chloride (K-DUR) 10 MEQ tablet Take 1 tablet (10 mEq total) by mouth daily. Patient not taking: Reported on 03/28/2016 03/17/15   Liliane Shi, PA-C    Family History Family History  Problem Relation Age of Onset  .  Hypertension Mother   . Heart disease Mother   . Diabetes Mother     Social History Social History  Substance Use Topics  . Smoking status: Former Smoker    Quit date: 07/28/2002  . Smokeless tobacco: Never Used  . Alcohol use No     Allergies   Patient has no known allergies.   Review of Systems Review of Systems  Constitutional: Negative for chills and fever.  HENT: Negative.   Respiratory: Positive for shortness of breath. Negative for cough.   Cardiovascular: Positive for leg swelling.  Negative for chest pain and palpitations.  Gastrointestinal: Negative.  Negative for abdominal pain, nausea and vomiting.  Genitourinary: Negative.   Musculoskeletal: Negative.  Negative for myalgias.  Skin: Negative.   Neurological: Negative.  Negative for syncope and weakness.     Physical Exam Updated Vital Signs BP 121/98   Pulse 111   Temp 98.3 F (36.8 C) (Oral)   Resp 22   SpO2 93%   Physical Exam  Constitutional: He is oriented to person, place, and time. He appears well-developed and well-nourished. No distress.  HENT:  Head: Normocephalic.  Neck: Normal range of motion. Neck supple.  Cardiovascular: Regular rhythm and intact distal pulses.  Tachycardia present.   No murmur heard. Pulmonary/Chest: Effort normal and breath sounds normal. He has no wheezes. He has no rales.  Abdominal: Soft. Bowel sounds are normal. There is no tenderness. There is no rebound and no guarding.  Musculoskeletal: Normal range of motion. He exhibits no edema.  Left knee is swollen with moderate effusion. No redness or warmth. Positive crepitance. FROM.   Neurological: He is alert and oriented to person, place, and time.  Skin: Skin is warm and dry. No rash noted.  Psychiatric: He has a normal mood and affect.     ED Treatments / Results  Labs (all labs ordered are listed, but only abnormal results are displayed) Labs Reviewed  COMPREHENSIVE METABOLIC PANEL - Abnormal; Notable for the following:       Result Value   Sodium 129 (*)    Potassium 2.7 (*)    Chloride 87 (*)    Glucose, Bld 529 (*)    Creatinine, Ser 2.24 (*)    Calcium 8.8 (*)    Albumin 3.0 (*)    AST 14 (*)    ALT 10 (*)    Total Bilirubin 3.0 (*)    GFR calc non Af Amer 34 (*)    GFR calc Af Amer 39 (*)    All other components within normal limits  TROPONIN I - Abnormal; Notable for the following:    Troponin I 0.05 (*)    All other components within normal limits  BRAIN NATRIURETIC PEPTIDE - Abnormal;  Notable for the following:    B Natriuretic Peptide 1,431.5 (*)    All other components within normal limits  CBC WITH DIFFERENTIAL/PLATELET    EKG  EKG Interpretation  Date/Time:  Tuesday March 28 2016 01:20:14 EST Ventricular Rate:  112 PR Interval:    QRS Duration: 166 QT Interval:  376 QTC Calculation: 513 R Axis:   163 Text Interpretation:  Sinus tachycardia Interpretation limited secondary to artifact Abnormal ECG Right axis deviation RSR' or QR pattern in V1 suggests right ventricular conduction delay Left bundle branch block Abnormal ECG Confirmed by Christy Gentles  MD, DONALD ([id number]) on 03/28/2016 1:31:08 AM       Radiology Dg Chest 2 View  Result Date: 03/28/2016 CLINICAL DATA:  Shortness of  breath EXAM: CHEST  2 VIEW COMPARISON:  04/25/2007 FINDINGS: Left-sided single lead pacing device with lead over right ventricle. Moderate cardiomegaly without overt failure. No acute infiltrate, consolidation, or pleural effusion. No pneumothorax peer IMPRESSION: Cardiomegaly without overt failure.  No acute infiltrate Electronically Signed   By: Donavan Foil M.D.   On: 03/28/2016 01:55    Procedures Procedures (including critical care time)  Medications Ordered in ED Medications  sodium chloride 0.9 % bolus 500 mL (500 mLs Intravenous Not Given 03/28/16 0558)  potassium chloride SA (K-DUR,KLOR-CON) CR tablet 40 mEq (not administered)  potassium chloride SA (K-DUR,KLOR-CON) CR tablet 40 mEq (40 mEq Oral Not Given 03/28/16 0558)  insulin aspart (novoLOG) injection 15 Units (not administered)  potassium chloride 30 mEq in sodium chloride 0.9 % 265 mL (KCL MULTIRUN) IVPB (not administered)     Initial Impression / Assessment and Plan / ED Course  I have reviewed the triage vital signs and the nursing notes.  Pertinent labs & imaging results that were available during my care of the patient were reviewed by me and considered in my medical decision making (see chart for  details).  Clinical Course     Patient presents with knee pain and swelling but reports DOE without chest pain. He is found to be tachycardic with an EKG changed from prior showing LBBB, right axis deviation and suggestion of conduction delay in V1.  Labs show significant abnormalities as well: hyperglycemic to 529 without evidence acidoss; hypokalemia of 2.7, possibly contributing to dysrhythmia; AKI with Cr 2.24 with baseline of 1.5; elevated BNP of 1431.5 and troponin of 0.05, suggesting failure (troponin in the past elevated to 0.13 which appears baseline).  St. Jude ICD interrgated and found to have multiple discharges recently. Patient denies sensing shocks. Discussed with Dr. Betsey Holiday. Cardiology consulted with anticipated admission to their service.   Final Clinical Impressions(s) / ED Diagnoses   Final diagnoses:  None  1. Dysrhythmia 2. Hypokalemia 3. AKI 4. CHF 5. Hyperglycemia   New Prescriptions New Prescriptions   No medications on file     Charlann Lange, Hershal Coria 03/28/16 [place], MD 03/28/16 [phone]

## 2016-03-28 NOTE — ED Triage Notes (Signed)
Pt complaining of L knee pain and swelling. Pt denies any injury/trauma. Pt states also feeling occasionally dyspneic with exertion. Pt states hx CHF. Pt states taking lasix as rx'd. Pt denies any chest pain or SOB at rest.

## 2016-03-28 NOTE — Consult Note (Addendum)
ELECTROPHYSIOLOGY CONSULT NOTE    Patient ID: Travis Bryan MRN: [id number], DOB/AGE: [birth year] 46 y.o.  Admit date: 03/28/2016 Date of Consult: 03/28/2016   Primary Physician: No PCP Per Patient Primary Cardiologist: Dr. Lovena Le Requesting MD: Dr. Saunders Revel  Reason for Consultation: ICD therapy  HPI: Travis Bryan is a 46 y.o. male with PMHx of NICM, chronic CHF (systolic), HTN, DM, HLD came to the ER with c/o R knee pain and a feeling like he had fluid in his chest.  He denies any CP, palpitations.  He denies any near syncope or syncope, in discussion regarding his ICD therapy 03/26/16, he did not know he had been shocked, and again denied any hx of syncope.   LABS: K+ 2.7 (replacement in progress) BUN/Creat 18/2.24 Glucose 529 Mag 1.6 H/H 13/40 WBC 7.0 plts 243 BNP 1431 Trop I: 0.05   DEVICE information: SJM single chamber ICD, implanted 2009, primary prevention, Dr. Lovena Le, gen change 08/27/13.   Past Medical History:  Diagnosis Date  . Chronic systolic CHF (congestive heart failure) ([place])   . Diabetes mellitus   . Hyperlipidemia   . Hypertension   . NICM (nonischemic cardiomyopathy) ([place])    a. LHC 6/06: pLAD 20, pLCx 20-30; b. Echo 5/15:  EF 15%, diffuse HK, restrictive physiology, trivial AI, trivial MR, mild LAE, moderate RVE, moderately reduced RVSF, moderate RAE, mild to moderate TR, PASP 43 mmHg  . Obesity   . S/P implantation of automatic cardioverter/defibrillator (AICD)      Surgical History:  Past Surgical History:  Procedure Laterality Date  . ICD  04/24/07; 08-27-2013   [name]. [name] II VR from [place] for nonischemic cardiomyopathy; gen chamge 08-27-2013  . IMPLANTABLE CARDIOVERTER DEFIBRILLATOR (ICD) GENERATOR CHANGE N/A 08/27/2013   Procedure: ICD GENERATOR CHANGE;  Surgeon: Evans Lance, MD;  Location: [facility] CATH LAB;  Service: Cardiovascular;  Laterality: N/A;      (Not in a hospital admission)  Inpatient Medications:   Allergies: No Known  Allergies  Social History   Social History  . Marital status: Married    Spouse name: N/A  . Number of children: N/A  . Years of education: N/A   Occupational History  . Not on file.   Social History Main Topics  . Smoking status: Former Smoker    Quit date: 07/28/2002  . Smokeless tobacco: Never Used  . Alcohol use No  . Drug use: No  . Sexual activity: Not on file   Other Topics Concern  . Not on file   Social History Narrative  . No narrative on file     Family History  Problem Relation Age of Onset  . Hypertension Mother   . Heart disease Mother   . Diabetes Mother      Review of Systems: All other systems reviewed and are otherwise negative except as noted above.  Physical Exam: Vitals:   03/28/16 0814 03/28/16 0830 03/28/16 0900 03/28/16 1000  BP: 110/82 113/76 125/87 117/96  Pulse: 114 103 111 111  Resp: 25 23 (!) 27 26  Temp:      TempSrc:      SpO2: 95% 95% 94% 100%    GEN- The patient is well appearing, alert and oriented x 3 today.  Sleeping but wakes to verbal and is conversatioinal HEENT: normocephalic, atraumatic; sclera clear, conjunctiva pink; hearing intact; oropharynx clear; neck supple, no JVP Lymph- no cervical lymphadenopathy Lungs- diminished at the bases, normal work of breathing.  No wheezes, rales, rhonchi Heart-  RRR, no murmurs, rubs or gallops, PMI not laterally displaced GI- soft, non-tender, non-distended, bowel sounds present Extremities- no clubbing, cyanosis, trace edema MS- no significant deformity or atrophy Skin- warm and dry, no rash or lesion Psych- euthymic mood, full affect Neuro- no gross deficits observed  Labs:   Lab Results  Component Value Date   WBC 7.0 03/28/2016   HGB 13.9 03/28/2016   HCT 40.9 03/28/2016   MCV 88.9 03/28/2016   PLT 243 03/28/2016    Recent Labs Lab 03/28/16 0407  NA 129*  K 2.7*  CL 87*  CO2 28  BUN 18  CREATININE 2.24*  CALCIUM 8.8*  PROT 7.0  BILITOT 3.0*  ALKPHOS 124   ALT 10*  AST 14*  GLUCOSE 529*      Radiology/Studies:  Dg Chest 2 View Result Date: 03/28/2016 CLINICAL DATA:  Shortness of breath EXAM: CHEST  2 VIEW COMPARISON:  04/25/2007 FINDINGS: Left-sided single lead pacing device with lead over right ventricle. Moderate cardiomegaly without overt failure. No acute infiltrate, consolidation, or pleural effusion. No pneumothorax peer IMPRESSION: Cardiomegaly without overt failure.  No acute infiltrate Electronically Signed   By: Donavan Foil M.D.   On: 03/28/2016 01:55    EKG: Presenting EKG is SR, LBBB, 112bpm, QRS 160ms #2 is ST, 113bpm, PR 225ms, QRS 170ms, QTc 579ms TELEMETRY: SR  08/08/13: TTE Study Conclusions - Left ventricle: The cavity size was mildly dilated. Wall thickness was normal. The estimated ejection fraction was 15%. Diffuse hypokinesis. Doppler parameters are consistent with restrictive physiology, indicative of decreased left ventricular diastolic compliance and/or increased left atrial pressure. E/medial e&' > 15 suggests LV end diastolic pressure at least 20 mmHg. - Ventricular septum: The interventricular septum is D-shaped, suggestive of RV pressure/volume overload. - Aortic valve: There was no stenosis. There was trivial regurgitation. - Mitral valve: There was trivial regurgitation. - Left atrium: The atrium was mildly dilated. - Right ventricle: The cavity size was moderately dilated. Pacer wire or catheter noted in right ventricle. Systolic function was moderately reduced. - Right atrium: The atrium was moderately dilated. - Tricuspid valve: There was mild-moderate regurgitation. Peak RV-RA gradient (S): 28 mm Hg. - Pulmonary arteries: PA peak pressure: 43 mm Hg (S). - Systemic veins: IVC measured 3.0 cm with < 50% respirophasic variation, suggesting RA pressure 15 mmHg. Impressions: - Mildly dilated LV with EF 15%, diffuse hypokinesis. Restrictive diastolic function with  evidence for elevated LV filling pressure. Moderately dilated RV with moderately decreased systolic function. D-shaped interventricular septum suggestive of RV pressure/volume overload.    Assessment and Plan:   1. Polymorphic VT 03/26/16     successfully terminated with 20J shock     Patient was unaware of event     Agree with amiodarone, QTc too long for Sotalol Also noted is 01/11/16: Episode in VF zone that he ATP failed to terminate and was shocked w/20J with success.    Echo is pending  The patient has been made aware of [place] law, no driving for 6 month s/p ICD therapy  2. CHF exacerbation      IV diurestics started      On BB/ACE/spironolacton and hydralazine    Presenting EKG with wide QRS LBBB, f/u resolved Patient denies anginal complaints, given VF, would consider ischemic evaluation.  3. Significant hypokalemia, electrolyte imbalanace, noting hyponatremia as well 4. Significant hyperglycemia     DM ooc Suspect medication non-compliance though patient assures [place] he takes his medicines as prescibed Continue care with  primary team, addressing  5. HTN     BP stable    Signed, Tommye Standard, PA-C 03/28/2016 10:47 AM   I have seen and examined this patient with Tommye Standard.  Agree with above, note added to reflect my findings.  On exam, tachycardic, regular rhythm, no murmurs, crackles throughout. Presented to the hospital with vague complaints of knee pain and feeling like he had fluid in his chest. He was found to be in heart failure with an elevated BNP, hypokalemic, and hyponatremic. On device interrogation, he was found to have had ventricular fibrillation on 1/14. He received ICD shock for his VF. In discussing this with the patient, he does not remember his defibrillator firing. He says that he did not pass out at any time. For his episode of VF, which start him on amiodarone 400 mg twice a day. He is young, but his QTC is prolonged at this time, and  therefore would not be a good candidate for sotalol. Of note due to his ICD shock, advised him of [place] and no driving for 6 months.  Ousman Dise M. Sufyaan Palma MD 03/28/2016 2:42 PM

## 2016-03-29 ENCOUNTER — Inpatient Hospital Stay (HOSPITAL_COMMUNITY): Payer: BLUE CROSS/BLUE SHIELD

## 2016-03-29 DIAGNOSIS — I483 Typical atrial flutter: Secondary | ICD-10-CM

## 2016-03-29 DIAGNOSIS — R0602 Shortness of breath: Secondary | ICD-10-CM

## 2016-03-29 DIAGNOSIS — N179 Acute kidney failure, unspecified: Secondary | ICD-10-CM

## 2016-03-29 DIAGNOSIS — I472 Ventricular tachycardia: Secondary | ICD-10-CM

## 2016-03-29 LAB — BASIC METABOLIC PANEL
Anion gap: 10 (ref 5–15)
Anion gap: 6 (ref 5–15)
BUN: 16 mg/dL (ref 6–20)
BUN: 15 mg/dL (ref 6–20)
CALCIUM: 8.7 mg/dL — AB (ref 8.9–10.3)
CALCIUM: 8.7 mg/dL — AB (ref 8.9–10.3)
CO2: 28 mmol/L (ref 22–32)
CO2: 32 mmol/L (ref 22–32)
CREATININE: 1.76 mg/dL — AB (ref 0.61–1.24)
Chloride: 97 mmol/L — ABNORMAL LOW (ref 101–111)
Chloride: 100 mmol/L — ABNORMAL LOW (ref 101–111)
Creatinine, Ser: 1.62 mg/dL — ABNORMAL HIGH (ref 0.61–1.24)
GFR calc Af Amer: 52 mL/min — ABNORMAL LOW (ref >60)
GFR calc Af Amer: 58 mL/min — ABNORMAL LOW (ref >60)
GFR calc non Af Amer: 45 mL/min — ABNORMAL LOW (ref >60)
GFR, EST NON AFRICAN AMERICAN: 50 mL/min — AB (ref >60)
GLUCOSE: 377 mg/dL — AB (ref 65–99)
Glucose, Bld: 127 mg/dL — ABNORMAL HIGH (ref 65–99)
POTASSIUM: 3.4 mmol/L — AB (ref 3.5–5.1)
Potassium: 3.2 mmol/L — ABNORMAL LOW (ref 3.5–5.1)
Sodium: 135 mmol/L (ref 135–145)
Sodium: 138 mmol/L (ref 135–145)

## 2016-03-29 LAB — GLUCOSE, CAPILLARY
GLUCOSE-CAPILLARY: 333 mg/dL — AB (ref 65–99)
GLUCOSE-CAPILLARY: 191 mg/dL — AB (ref 65–99)
Glucose-Capillary: 115 mg/dL — ABNORMAL HIGH (ref 65–99)
Glucose-Capillary: 274 mg/dL — ABNORMAL HIGH (ref 65–99)

## 2016-03-29 LAB — NM MYOCAR MULTI W/SPECT W/WALL MOTION / EF
CHL CUP MPHR: 175 {beats}/min
CSEPEW: 1 METS
Exercise duration (min): 0 min
Exercise duration (sec): 0 s
Peak HR: 111 {beats}/min
Percent HR: 63 %
Rest HR: 91 {beats}/min

## 2016-03-29 MED ORDER — HEPARIN BOLUS VIA INFUSION
4000.0000 [IU] | Freq: Once | INTRAVENOUS | Status: DC
  Filled 2016-03-29: qty 4000

## 2016-03-29 MED ORDER — AMIODARONE HCL IN DEXTROSE 360-4.14 MG/200ML-% IV SOLN: 60.0000 mg/h | INTRAVENOUS | Status: DC

## 2016-03-29 MED ORDER — HEPARIN (PORCINE) IN NACL 100-0.45 UNIT/ML-% IJ SOLN: 1400.0000 [IU]/h | INTRAMUSCULAR | Status: DC

## 2016-03-29 MED ORDER — TECHNETIUM TC 99M TETROFOSMIN IV KIT
30.0000 | PACK | Freq: Once | INTRAVENOUS | Status: AC | PRN
  Administered 2016-03-29: 30 via INTRAVENOUS

## 2016-03-29 MED ORDER — AMIODARONE LOAD VIA INFUSION
150.0000 mg | Freq: Once | INTRAVENOUS | Status: DC
  Filled 2016-03-29: qty 83.34

## 2016-03-29 MED ORDER — LIVING WELL WITH DIABETES BOOK
Freq: Once | Status: AC
  Administered 2016-03-29: 14:00:00
  Filled 2016-03-29: qty 1

## 2016-03-29 MED ORDER — APIXABAN 5 MG PO TABS
5.0000 mg | ORAL_TABLET | Freq: Two times a day (BID) | ORAL | Status: DC
  Administered 2016-03-29 – 2016-04-02 (×9): 5 mg via ORAL
  Filled 2016-03-29 (×9): qty 1

## 2016-03-29 MED ORDER — AMIODARONE HCL 200 MG PO TABS
400.0000 mg | ORAL_TABLET | Freq: Two times a day (BID) | ORAL | Status: DC
  Administered 2016-03-29 – 2016-04-01 (×7): 400 mg via ORAL
  Filled 2016-03-29 (×6): qty 2

## 2016-03-29 MED ORDER — MAGNESIUM OXIDE 400 (241.3 MG) MG PO TABS
400.0000 mg | ORAL_TABLET | Freq: Every day | ORAL | Status: DC
  Administered 2016-03-29 – 2016-04-14 (×17): 400 mg via ORAL
  Filled 2016-03-29 (×17): qty 1

## 2016-03-29 MED ORDER — ISOSORBIDE MONONITRATE ER 30 MG PO TB24
30.0000 mg | ORAL_TABLET | Freq: Every day | ORAL | Status: DC
  Administered 2016-03-29 – 2016-03-30 (×2): 30 mg via ORAL
  Filled 2016-03-29 (×2): qty 1

## 2016-03-29 MED ORDER — INSULIN STARTER KIT- SYRINGES (ENGLISH)
1.0000 | Freq: Once | Status: AC
  Administered 2016-03-29: 1
  Filled 2016-03-29: qty 1

## 2016-03-29 MED ORDER — AMIODARONE HCL IN DEXTROSE 360-4.14 MG/200ML-% IV SOLN: 30.0000 mg/h | INTRAVENOUS | Status: DC

## 2016-03-29 MED ORDER — REGADENOSON 0.4 MG/5ML IV SOLN
INTRAVENOUS | Status: AC
  Filled 2016-03-29: qty 5

## 2016-03-29 MED ORDER — POTASSIUM CHLORIDE CRYS ER 20 MEQ PO TBCR
40.0000 meq | EXTENDED_RELEASE_TABLET | Freq: Once | ORAL | Status: AC
  Administered 2016-03-29: 40 meq via ORAL
  Filled 2016-03-29: qty 2

## 2016-03-29 MED ORDER — INSULIN GLARGINE 100 UNIT/ML ~~LOC~~ SOLN: 10.0000 [IU] | Freq: Every day | SUBCUTANEOUS | Status: DC

## 2016-03-29 MED ORDER — METOLAZONE 2.5 MG PO TABS
2.5000 mg | ORAL_TABLET | Freq: Once | ORAL | Status: AC
  Administered 2016-03-29: 2.5 mg via ORAL
  Filled 2016-03-29: qty 1

## 2016-03-29 MED ORDER — REGADENOSON 0.4 MG/5ML IV SOLN
0.4000 mg | Freq: Once | INTRAVENOUS | Status: DC
  Filled 2016-03-29: qty 5

## 2016-03-29 MED ORDER — ORAL CARE MOUTH RINSE
15.0000 mL | Freq: Two times a day (BID) | OROMUCOSAL | Status: DC
  Administered 2016-03-29 – 2016-04-01 (×4): 15 mL via OROMUCOSAL

## 2016-03-29 MED ORDER — INSULIN ASPART 100 UNIT/ML ~~LOC~~ SOLN
5.0000 [IU] | Freq: Three times a day (TID) | SUBCUTANEOUS | Status: DC
  Administered 2016-03-29 – 2016-03-30 (×3): 5 [IU] via SUBCUTANEOUS

## 2016-03-29 MED ORDER — INSULIN GLARGINE 100 UNIT/ML ~~LOC~~ SOLN
20.0000 [IU] | Freq: Every day | SUBCUTANEOUS | Status: DC
  Administered 2016-03-29 – 2016-03-30 (×2): 20 [IU] via SUBCUTANEOUS
  Filled 2016-03-29 (×2): qty 0.2

## 2016-03-29 MED ORDER — TECHNETIUM TC 99M TETROFOSMIN IV KIT
10.0000 | PACK | Freq: Once | INTRAVENOUS | Status: AC | PRN
  Administered 2016-03-29: 10 via INTRAVENOUS

## 2016-03-29 NOTE — Progress Notes (Signed)
Patient ID: Travis Bryan, male   DOB: 06-18-1970, 46 y.o.   MRN: [id number]   SUBJECTIVE: Patient denies dyspnea at rest, was able to lie down for Cardiolite without problems.  Noted to be in atrial flutter today (flutter on admission ECG also).   Scheduled Meds: . amiodarone  400 mg Oral BID  . apixaban  5 mg Oral BID  . aspirin EC  81 mg Oral Daily  . carvedilol  25 mg Oral BID WC  . furosemide  80 mg Intravenous BID  . hydrALAZINE  50 mg Oral TID  . insulin aspart  0-20 Units Subcutaneous TID WC  . isosorbide mononitrate  30 mg Oral Daily  . lisinopril  40 mg Oral Daily  . magnesium oxide  400 mg Oral Daily  . mouth rinse  15 mL Mouth Rinse BID  . potassium chloride  40 mEq Oral BID  . regadenoson      . regadenoson  0.4 mg Intravenous Once  . spironolactone  25 mg Oral Daily   Continuous Infusions: PRN Meds:.acetaminophen, ondansetron (ZOFRAN) IV    Vitals:   03/29/16 0942 03/29/16 0947 03/29/16 0949 03/29/16 0951  BP: (!) 135/103 (!) 135/91 (!) 148/96 (!) 142/97  Pulse: (!) 106 98 (!) 110 (!) 111  Resp:      Temp:      TempSrc:      SpO2:  98%    Weight:        Intake/Output Summary (Last 24 hours) at 03/29/16 1055 Last data filed at 03/29/16 0636  Gross per 24 hour  Intake              620 ml  Output             1100 ml  Net             -480 ml    LABS: Basic Metabolic Panel:  Recent Labs  03/28/16 0407 03/28/16 1011 03/29/16 0506  NA 129*  --  135  K 2.7*  --  3.2*  CL 87*  --  97*  CO2 28  --  28  GLUCOSE 529*  --  377*  BUN 18  --  16  CREATININE 2.24*  --  1.76*  CALCIUM 8.8*  --  8.7*  MG  --  1.6*  --    Liver Function Tests:  Recent Labs  03/28/16 0407  AST 14*  ALT 10*  ALKPHOS 124  BILITOT 3.0*  PROT 7.0  ALBUMIN 3.0*   No results for input(s): LIPASE, AMYLASE in the last 72 hours. CBC:  Recent Labs  03/28/16 0407  WBC 7.0  NEUTROABS 4.8  HGB 13.9  HCT 40.9  MCV 88.9  PLT 243   Cardiac Enzymes:  Recent Labs  03/28/16 0407  TROPONINI 0.05*   BNP: Invalid input(s): POCBNP D-Dimer: No results for input(s): DDIMER in the last 72 hours. Hemoglobin A1C: No results for input(s): HGBA1C in the last 72 hours. Fasting Lipid Panel: No results for input(s): CHOL, HDL, LDLCALC, TRIG, CHOLHDL, LDLDIRECT in the last 72 hours. Thyroid Function Tests: No results for input(s): TSH, T4TOTAL, T3FREE, THYROIDAB in the last 72 hours.  Invalid input(s): FREET3 Anemia Panel: No results for input(s): VITAMINB12, FOLATE, FERRITIN, TIBC, IRON, RETICCTPCT in the last 72 hours.  RADIOLOGY: Dg Chest 2 View  Result Date: 03/28/2016 CLINICAL DATA:  Shortness of breath EXAM: CHEST  2 VIEW COMPARISON:  04/25/2007 FINDINGS: Left-sided single lead pacing device with lead over right ventricle.  Moderate cardiomegaly without overt failure. No acute infiltrate, consolidation, or pleural effusion. No pneumothorax peer IMPRESSION: Cardiomegaly without overt failure.  No acute infiltrate Electronically Signed   By: Donavan Foil M.D.   On: 03/28/2016 01:55    PHYSICAL EXAM General: NAD Neck: JVP 14-16 cm, no thyromegaly or thyroid nodule.  Lungs: Clear to auscultation bilaterally with normal respiratory effort. CV: Lateral PMI.  Heart tachy, irregular S1/S2, no S3/S4, no murmur.  No peripheral edema.  Abdomen: Soft, nontender, no hepatosplenomegaly, mild distention.  Neurologic: Alert and oriented x 3.  Psych: Normal affect. Extremities: No clubbing or cyanosis.   TELEMETRY: Reviewed telemetry pt in atrial flutter, rate in 100s  ASSESSMENT AND PLAN: 46 yo with long history of nonischemic cardiomyopathy, HTN, DM, and CKD stage III presented with exertional dyspnea/CHF exacerbation and was noted to have had ICD discharge on 1/14 for polymorphic VT.  1. Atrial flutter with mild RVR: Newly noted, was in atrial flutter at admission.  Given severe cardiomyopathy, he will not tolerate this well. This may have triggered his current  CHF exacerbation.  - Continue amiodarone, begun at admission for VT.  - Continue Coreg. - Start apixaban.   - Discuss with Dr Lovena Le, possible flutter ablation on Friday, would need TEE on Friday am prior.  2. Acute on chronic systolic CHF: EF 74-73% on echo with moderate to severely decreased RV systolic function.  He is volume overloaded on exam, BNP elevated at admission.  BP stable.  Possibly hypertensive cardiomyopathy.  Had cath around time of diagnosis in 2006 without significant coronary disease.  He was set up today for Cardiolite, but suspect not ischemic.  - Lasix 80 mg IV bid.  Replace K, and will give a dose of metolazone before the evening dose.  - Continue hydralazine, add Imdur 30 daily.  - Continue Coreg and lisinopril. Would consider transition to [facility] down the road. - Continue spironolactone 25.  - Await Cardiolite results, high threshold for catheterization.  3. CKD: Stage III.  Creatinine better today at 1.76.  Baseline looks to be around 1.5.  Likely related to HTN and DM.  Watch carefully with diuresis.  4. DM: Poor control.  Will get diabetes consult.  5. VT: Polymorphic VT on 1/14 with ICD discharge, VT on 01/11/16 with ICD discharge.  Suspect related to CHF exacerbation rather than ischemia.  Amiodarone started.    Loralie Champagne 03/29/2016 11:10 AM

## 2016-03-29 NOTE — Progress Notes (Signed)
1 day lexiscan myoview completed without complication, pending result by [facility] radiologist  Signed, Almyra Deforest PA Pager: [phone]

## 2016-03-29 NOTE — Progress Notes (Signed)
Patient with no complaints or concerns during 7pm - 7am shift.  Taiten Brawn, RN 

## 2016-03-29 NOTE — Progress Notes (Signed)
Inpatient Diabetes Program Recommendations  AACE/ADA: New Consensus Statement on Inpatient Glycemic Control (2015)  Target Ranges:  Prepandial:   less than 140 mg/dL      Peak postprandial:   less than 180 mg/dL (1-2 hours)      Critically ill patients:  140 - 180 mg/dL   Lab Results  Component Value Date   GLUCAP 191 (H) 03/29/2016   HGBA1C >14.0 07/28/2010    Review of Glycemic Control  Diabetes history: DM2 Outpatient Diabetes medications: Metformin 1000 mg bid Current orders for Inpatient glycemic control: Lantus 10 units QHS, Novolog 0-20 units tidwc + 5 units tidwc  Inpatient Diabetes Program Recommendations:    Increase Lantus to 20 units QHS Add HS correction. Need HgbA1C to assess glycemic control prior to hospitalization  Long discussion with pt today regarding his glucose control. States he does not have a PCP for diabetes management. Doesn't know who orders his metformin. States he tries to eat healthy at home. Does not check blood sugars. States he has not experienced polyuria, polydipsia. Seemed annoyed when attempting to speak with him about diabetes. Stressed importance of obtaining PCP to manage his diabetes. Discussed how diet, exercise affect his blood sugars and importance of knowing what his blood sugar is. Needs much education. Will send Living Well book and order diabetes videos. If HgbA1C is > 9.0%, would benefit from going home on insulin.  Will f/u in am.  Thank you. Lorenda Peck, RD, LDN, CDE Inpatient Diabetes Coordinator [phone]

## 2016-03-29 NOTE — Progress Notes (Signed)
Pt c/o not feeling well EKG obtained. Pt is flutter. PA HO on the floor and spoke to pt. No new orders.

## 2016-03-29 NOTE — Discharge Instructions (Addendum)
°Information on my medicine - Coumadin®   (Warfarin) ° °This medication education was reviewed with me or my healthcare representative as part of my discharge preparation.  The pharmacist that spoke with me during my hospital stay was:  Delano Scardino Rhea, RPH ° °Why was Coumadin prescribed for you? °Coumadin was prescribed for you because you have a blood clot or a medical condition that can cause an increased risk of forming blood clots. Blood clots can cause serious health problems by blocking the flow of blood to the heart, lung, or brain. Coumadin can prevent harmful blood clots from forming. °As a reminder your indication for Coumadin is:   Stroke Prevention Because Of Atrial Fibrillation ° °What test will check on my response to Coumadin? °While on Coumadin (warfarin) you will need to have an INR test regularly to ensure that your dose is keeping you in the desired range. The INR (international normalized ratio) number is calculated from the result of the laboratory test called prothrombin time (PT). ° °If an INR APPOINTMENT HAS NOT ALREADY BEEN MADE FOR YOU please schedule an appointment to have this lab work done by your health care provider within 7 days. °Your INR goal is usually a number between:  2 to 3 or your provider may give you a more narrow range like 2-2.5.  Ask your health care provider during an office visit what your goal INR is. ° °What  do you need to  know  About  COUMADIN? °Take Coumadin (warfarin) exactly as prescribed by your healthcare provider about the same time each day.  DO NOT stop taking without talking to the doctor who prescribed the medication.  Stopping without other blood clot prevention medication to take the place of Coumadin may increase your risk of developing a new clot or stroke.  Get refills before you run out. ° °What do you do if you miss a dose? °If you miss a dose, take it as soon as you remember on the same day then continue your regularly scheduled regimen the  next day.  Do not take two doses of Coumadin at the same time. ° °Important Safety Information °A possible side effect of Coumadin (Warfarin) is an increased risk of bleeding. You should call your healthcare provider right away if you experience any of the following: °  Bleeding from an injury or your nose that does not stop. °  Unusual colored urine (red or dark brown) or unusual colored stools (red or black). °  Unusual bruising for unknown reasons. °  A serious fall or if you hit your head (even if there is no bleeding). ° °Some foods or medicines interact with Coumadin® (warfarin) and might alter your response to warfarin. To help avoid this: °  Eat a balanced diet, maintaining a consistent amount of Vitamin K. °  Notify your provider about major diet changes you plan to make. °  Avoid alcohol or limit your intake to 1 drink for women and 2 drinks for men per day. °(1 drink is 5 oz. wine, 12 oz. beer, or 1.5 oz. liquor.) ° °Make sure that ANY health care provider who prescribes medication for you knows that you are taking Coumadin (warfarin).  Also make sure the healthcare provider who is monitoring your Coumadin knows when you have started a new medication including herbals and non-prescription products. ° °Coumadin® (Warfarin)  Major Drug Interactions  °Increased Warfarin Effect Decreased Warfarin Effect  °Alcohol (large quantities) °Antibiotics (esp. Septra/Bactrim, Flagyl, Cipro) °Amiodarone (Cordarone) °Aspirin (  ASA) °Cimetidine (Tagamet) °Megestrol (Megace) °NSAIDs (ibuprofen, naproxen, etc.) °Piroxicam (Feldene) °Propafenone (Rythmol SR) °Propranolol (Inderal) °Isoniazid (INH) °Posaconazole (Noxafil) Barbiturates (Phenobarbital) °Carbamazepine (Tegretol) °Chlordiazepoxide (Librium) °Cholestyramine (Questran) °Griseofulvin °Oral Contraceptives °Rifampin °Sucralfate (Carafate) °Vitamin K  ° °Coumadin® (Warfarin) Major Herbal Interactions  °Increased Warfarin Effect Decreased Warfarin Effect   °Garlic °Ginseng °Ginkgo biloba Coenzyme Q10 °Green tea °St. John’s wort   ° °Coumadin® (Warfarin) FOOD Interactions  °Eat a consistent number of servings per week of foods HIGH in Vitamin K °(1 serving = ½ cup)  °Collards (cooked, or boiled & drained) °Kale (cooked, or boiled & drained) °Mustard greens (cooked, or boiled & drained) °Parsley *serving size only = ¼ cup °Spinach (cooked, or boiled & drained) °Swiss chard (cooked, or boiled & drained) °Turnip greens (cooked, or boiled & drained)  °Eat a consistent number of servings per week of foods MEDIUM-HIGH in Vitamin K °(1 serving = 1 cup)  °Asparagus (cooked, or boiled & drained) °Broccoli (cooked, boiled & drained, or raw & chopped) °Brussel sprouts (cooked, or boiled & drained) *serving size only = ½ cup °Lettuce, raw (green leaf, endive, romaine) °Spinach, raw °Turnip greens, raw & chopped  ° °These websites have more information on Coumadin (warfarin):  www.coumadin.com; °www.ahrq.gov/consumer/coumadin.htm; ° ° ° °

## 2016-03-29 NOTE — Progress Notes (Signed)
ANTICOAGULATION CONSULT NOTE - Initial Consult  Pharmacy Consult:  Heparin Indication:  New-onset Aflutter  No Known Allergies  Patient Measurements: Weight: 231 lb (104.8 kg) Heparin Dosing Weight: 95 kg  Vital Signs: Temp: 98.1 F (36.7 C) (01/17 0819) Temp Source: Oral (01/17 0819) BP: 142/97 (01/17 0951) Pulse Rate: 111 (01/17 0951)  Labs:  Recent Labs  03/28/16 0407 03/29/16 0506  HGB 13.9  --   HCT 40.9  --   PLT 243  --   CREATININE 2.24* 1.76*  TROPONINI 0.05*  --     Estimated Creatinine Clearance: 64.2 mL/min (by C-G formula based on SCr of 1.76 mg/dL (H)).   Medical History: Past Medical History:  Diagnosis Date  . AICD (automatic cardioverter/defibrillator) present   . Chronic systolic CHF (congestive heart failure) ([place])   . History of gout   . Hyperlipidemia   . Hypertension   . Myocardial infarction    "I think I had one a long long time ago" (03/28/2016)  . NICM (nonischemic cardiomyopathy) ([place])    a. LHC 6/06: pLAD 20, pLCx 20-30; b. Echo 5/15:  EF 15%, diffuse HK, restrictive physiology, trivial AI, trivial MR, mild LAE, moderate RVE, moderately reduced RVSF, moderate RAE, mild to moderate TR, PASP 43 mmHg  . Obesity   . Type II diabetes mellitus ([place])      Assessment: 53 YOM presented with dyspnea on exertion.  Interrogation of AICD reveals that patient received a shock 2 days PTA for Vfib.  Now with new-onset Aflutter and Pharmacy consulted to initiate IV heparin while awaiting ischemic evaluation.  Baseline labs reviewed and noted that patient has been refusing heparin SQ.   Goal of Therapy:  Heparin level 0.3-0.7 units/ml Monitor platelets by anticoagulation protocol: Yes    Plan:  - D/C heparin SQ - Heparin 4000 units IV bolus x 1, then - Heparin gtt at 1400 units/hr - Check 6 hr heparin level - Daily heparin level and CBC   Havannah Streat D. Mina Marble, PharmD, BCPS Pager:  [phone] 03/29/2016, 10:45 AM

## 2016-03-29 NOTE — Progress Notes (Signed)
Patient Name: Travis Bryan Date of Encounter: 03/29/2016  Primary Cardiologist: Dr. Lovena Le, Does not have a general cardiologist.   Hospital Problem List     Active Problems:   Acute on chronic clinical systolic heart failure ([place])     Subjective   Feels ok. Denies chest pain.   Inpatient Medications    Scheduled Meds: . regadenoson      . amiodarone  400 mg Oral BID  . aspirin EC  81 mg Oral Daily  . carvedilol  25 mg Oral BID WC  . furosemide  80 mg Intravenous BID  . heparin  5,000 Units Subcutaneous Q8H  . hydrALAZINE  50 mg Oral TID  . insulin aspart  0-20 Units Subcutaneous TID WC  . lisinopril  40 mg Oral Daily  . mouth rinse  15 mL Mouth Rinse BID  . potassium chloride  40 mEq Oral BID  . regadenoson  0.4 mg Intravenous Once  . spironolactone  25 mg Oral Daily   Continuous Infusions:  PRN Meds: acetaminophen, ondansetron (ZOFRAN) IV   Vital Signs    Vitals:   03/29/16 0942 03/29/16 0947 03/29/16 0949 03/29/16 0951  BP: (!) 135/103 (!) 135/91 (!) 148/96 (!) 142/97  Pulse: (!) 106 98 (!) 110 (!) 111  Resp:      Temp:      TempSrc:      SpO2:  98%    Weight:        Intake/Output Summary (Last 24 hours) at 03/29/16 0952 Last data filed at 03/29/16 0636  Gross per 24 hour  Intake              620 ml  Output             1100 ml  Net             -480 ml   Filed Weights   03/28/16 1500 03/29/16 0443  Weight: 231 lb 11.3 oz (105.1 kg) 231 lb (104.8 kg)    Physical Exam   GEN: Well nourished, well developed, in no acute distress.  HEENT: Grossly normal.  Neck: Supple, no JVD, carotid bruits, or masses. Cardiac: RRR, no murmurs, rubs, or gallops. No clubbing, cyanosis, edema.  Radials/DP/PT 2+ and equal bilaterally.  Respiratory:  Respirations regular and unlabored, clear to auscultation bilaterally. GI: Soft, nontender, nondistended, BS + x 4. MS: no deformity or atrophy. Skin: warm and dry, no rash. Neuro:  Strength and sensation are  intact. Psych: AAOx3.  Normal affect.  Labs    CBC  Recent Labs  03/28/16 0407  WBC 7.0  NEUTROABS 4.8  HGB 13.9  HCT 40.9  MCV 88.9  PLT 716   Basic Metabolic Panel  Recent Labs  03/28/16 0407 03/28/16 1011 03/29/16 0506  NA 129*  --  135  K 2.7*  --  3.2*  CL 87*  --  97*  CO2 28  --  28  GLUCOSE 529*  --  377*  BUN 18  --  16  CREATININE 2.24*  --  1.76*  CALCIUM 8.8*  --  8.7*  MG  --  1.6*  --    Liver Function Tests  Recent Labs  03/28/16 0407  AST 14*  ALT 10*  ALKPHOS 124  BILITOT 3.0*  PROT 7.0  ALBUMIN 3.0*   Cardiac Enzymes  Recent Labs  03/28/16 0407  TROPONINI 0.05*     Telemetry     2:1 atrial flutter. - Personally Reviewed  ECG  2:1 atrial flutter.  - Personally Reviewed  Radiology    Dg Chest 2 View  Result Date: 03/28/2016 CLINICAL DATA:  Shortness of breath EXAM: CHEST  2 VIEW COMPARISON:  04/25/2007 FINDINGS: Left-sided single lead pacing device with lead over right ventricle. Moderate cardiomegaly without overt failure. No acute infiltrate, consolidation, or pleural effusion. No pneumothorax peer IMPRESSION: Cardiomegaly without overt failure.  No acute infiltrate Electronically Signed   By: Donavan Foil M.D.   On: 03/28/2016 01:55    Cardiac Studies   Transthoracic Echocardiography 03/28/16 Study Conclusions  - Left ventricle: The cavity size was mildly reduced. Systolic   function was severely reduced. The estimated ejection fraction   was in the range of 10% to 15%. - Left atrium: The atrium was moderately dilated. - Right ventricle: Systolic function was moderately to severely   reduced. Patient Profile     Travis Bryan is a 46 year old male with a past medical history of nonischemic cardiomyopathy EF is 10-15% this admission. Also with history of HTN and DM. Has AICD in place. Presented on 03/28/16 with DOE and BNP was 1400.   Assessment & Plan    1. Acute on chronic combined systolic and diastolic CHF: EF  40-98% this admission, last EF was in 2015 and was 15%. BNP 1431. Diuresed with 80mg  IV Lasix (only gotten one dose so far?) so only out 480 ml so far. He admits to dietary noncompliance the past few days and intermittent compliance with his medications.   Continue Spiro 25mg  daily, lisinopril 40mg  daily, hydralazine 50mg  TID, and Coreg 25mg  BID.   Can add metolazone if IV diuresis doesn't pick up, however has only gotten one dose today.   Last ischemic eval was cath in 2006. Patient had polymorphic VT and ICD shock x 1 on 03/27/15. Plan for [facility] today to rule out new ischemia.   2. Polymorphic VT: Started on po Amiodarone yesterday. No further VT on tele.   3. Hypokalemia: K is 3.2 this am. Getting 51mEq BID. Mag was 1.6 yesterday, with recent polymorphic VT will start on Mag-Ox daily.   4. Chronic renal insufficiency: Creatinine improved today.   5. New onset atrial flutter: Noted on telemetry and EKG. No previous documentation of Aflutter. Started on Amiodarone po yesterday for VT, question whether we need to start on IV Amiodarone as he has intermittent rapid rates. EP was consulted yesterday.   This raises the question of anticoagulation.   This patients CHA2DS2-VASc Score and unadjusted Ischemic Stroke Rate (% per year) is equal to 3.2 % stroke rate/year from a score of 3 Above score calculated as 1 point each if present [CHF, HTN, DM, Vascular=MI/PAD/Aortic Plaque, Age if 65-74, or Male], 2 points each if present [Age > 75, or Stroke/TIA/TE]  Put on heparin gtt for now, while awaiting ischemic evaluation.   Signed, Travis Leas, NP  03/29/2016, 9:52 AM   EP Attending  Patient seen and examined. Agree with above. He is well known to me from prior clinic visits. His heart failure has been reasonably well controlled. Exam appears mostly euvolemic with minimal basilar rales. No/minimal edema on lower extremity exam. Tele reveals atrial flutter. ICD interogation reviewed  with PMVT. myoview is pending. A/P 1. PMVT - continue oral amiodarone. Will plan to dc tomorrow if no more ventricular arrhythmias 2. Acute on chronic systolic heart failure - will give an additional dose of IV lasix and then plan to switch to po 3. Ischemia eval -  would only recommend heart cath if scan show large burden of ischemia, not for LV dysfunction as we know he has a h/o non-ischemic cardiomyopathy.  4. Atrial flutter - this is a new problem. I would anticipate he undergo catheter ablation in the near future though probably not during this admit. I will discuss with him further. For now, anti-coagulation is indicated with a CHADSVASC score of 2. Will start Eliquis 5 mg bid.  Mikle Bosworth.D.

## 2016-03-30 LAB — BASIC METABOLIC PANEL
Anion gap: 9 (ref 5–15)
BUN: 20 mg/dL (ref 6–20)
CO2: 30 mmol/L (ref 22–32)
Calcium: 9 mg/dL (ref 8.9–10.3)
Chloride: 97 mmol/L — ABNORMAL LOW (ref 101–111)
Creatinine, Ser: 1.76 mg/dL — ABNORMAL HIGH (ref 0.61–1.24)
GFR, EST AFRICAN AMERICAN: 52 mL/min — AB (ref >60)
GFR, EST NON AFRICAN AMERICAN: 45 mL/min — AB (ref >60)
Glucose, Bld: 295 mg/dL — ABNORMAL HIGH (ref 65–99)
POTASSIUM: 3.7 mmol/L (ref 3.5–5.1)
SODIUM: 136 mmol/L (ref 135–145)

## 2016-03-30 LAB — GLUCOSE, CAPILLARY
GLUCOSE-CAPILLARY: 278 mg/dL — AB (ref 65–99)
GLUCOSE-CAPILLARY: 236 mg/dL — AB (ref 65–99)
GLUCOSE-CAPILLARY: 93 mg/dL (ref 65–99)
Glucose-Capillary: 184 mg/dL — ABNORMAL HIGH (ref 65–99)

## 2016-03-30 MED ORDER — POTASSIUM CHLORIDE CRYS ER 20 MEQ PO TBCR
40.0000 meq | EXTENDED_RELEASE_TABLET | Freq: Once | ORAL | Status: AC
Start: 2016-03-30 — End: 2016-03-30
  Administered 2016-03-30: 40 meq via ORAL
  Filled 2016-03-30: qty 2

## 2016-03-30 MED ORDER — METOLAZONE 5 MG PO TABS
5.0000 mg | ORAL_TABLET | Freq: Once | ORAL | Status: AC
  Administered 2016-03-30: 5 mg via ORAL
  Filled 2016-03-30: qty 1

## 2016-03-30 MED ORDER — SODIUM CHLORIDE 0.9 % IV SOLN: INTRAVENOUS | Status: DC

## 2016-03-30 MED ORDER — INSULIN STARTER KIT- SYRINGES (ENGLISH)
1.0000 | Freq: Once | Status: AC
  Filled 2016-03-30: qty 1

## 2016-03-30 NOTE — Progress Notes (Signed)
Patient ID: Travis Bryan, male   DOB: 1970-04-19, 46 y.o.   MRN: [id number]   SUBJECTIVE: Patient remains in atrial flutter, rate controlled.  He did diurese some yesterday.   No dyspnea at rest.    Cardiolite: EF 7%, inferolateral fixed defect no ischemia.   Scheduled Meds: . amiodarone  400 mg Oral BID  . apixaban  5 mg Oral BID  . carvedilol  25 mg Oral BID WC  . furosemide  80 mg Intravenous BID  . hydrALAZINE  50 mg Oral TID  . insulin aspart  0-20 Units Subcutaneous TID WC  . insulin aspart  5 Units Subcutaneous TID WC  . insulin glargine  20 Units Subcutaneous QHS  . isosorbide mononitrate  30 mg Oral Daily  . lisinopril  40 mg Oral Daily  . magnesium oxide  400 mg Oral Daily  . mouth rinse  15 mL Mouth Rinse BID  . metolazone  5 mg Oral Once  . potassium chloride  40 mEq Oral BID  . potassium chloride  40 mEq Oral Once  . regadenoson  0.4 mg Intravenous Once  . spironolactone  25 mg Oral Daily   Continuous Infusions: PRN Meds:.acetaminophen, ondansetron (ZOFRAN) IV    Vitals:   03/29/16 0951 03/29/16 2116 03/30/16 0059 03/30/16 0534  BP: (!) 142/97 124/78 129/68 127/81  Pulse: (!) 111 86  89  Resp:  18 18 18   Temp:  98.2 F (36.8 C) 98.2 F (36.8 C) 97.6 F (36.4 C)  TempSrc:  Oral Oral Oral  SpO2:  100% 100% 98%  Weight:    229 lb 6.4 oz (104.1 kg)    Intake/Output Summary (Last 24 hours) at 03/30/16 0846 Last data filed at 03/30/16 0215  Gross per 24 hour  Intake              480 ml  Output             2300 ml  Net            -1820 ml    LABS: Basic Metabolic Panel:  Recent Labs  03/28/16 1011  03/29/16 1611 03/30/16 0533  NA  --   < > 138 136  K  --   < > 3.4* 3.7  CL  --   < > 100* 97*  CO2  --   < > 32 30  GLUCOSE  --   < > 127* 295*  BUN  --   < > 15 20  CREATININE  --   < > 1.62* 1.76*  CALCIUM  --   < > 8.7* 9.0  MG 1.6*  --   --   --   < > = values in this interval not displayed. Liver Function Tests:  Recent Labs   03/28/16 0407  AST 14*  ALT 10*  ALKPHOS 124  BILITOT 3.0*  PROT 7.0  ALBUMIN 3.0*   No results for input(s): LIPASE, AMYLASE in the last 72 hours. CBC:  Recent Labs  03/28/16 0407  WBC 7.0  NEUTROABS 4.8  HGB 13.9  HCT 40.9  MCV 88.9  PLT 243   Cardiac Enzymes:  Recent Labs  03/28/16 0407  TROPONINI 0.05*   BNP: Invalid input(s): POCBNP D-Dimer: No results for input(s): DDIMER in the last 72 hours. Hemoglobin A1C: No results for input(s): HGBA1C in the last 72 hours. Fasting Lipid Panel: No results for input(s): CHOL, HDL, LDLCALC, TRIG, CHOLHDL, LDLDIRECT in the last 72 hours. Thyroid Function Tests: No  results for input(s): TSH, T4TOTAL, T3FREE, THYROIDAB in the last 72 hours.  Invalid input(s): FREET3 Anemia Panel: No results for input(s): VITAMINB12, FOLATE, FERRITIN, TIBC, IRON, RETICCTPCT in the last 72 hours.  RADIOLOGY: Dg Chest 2 View  Result Date: 03/28/2016 CLINICAL DATA:  Shortness of breath EXAM: CHEST  2 VIEW COMPARISON:  04/25/2007 FINDINGS: Left-sided single lead pacing device with lead over right ventricle. Moderate cardiomegaly without overt failure. No acute infiltrate, consolidation, or pleural effusion. No pneumothorax peer IMPRESSION: Cardiomegaly without overt failure.  No acute infiltrate Electronically Signed   By: Donavan Foil M.D.   On: 03/28/2016 01:55   Nm Myocar Multi W/spect W/wall Motion / Ef  Result Date: 03/29/2016 CLINICAL DATA:  History of cardiomyopathy and atrial flutter. Acute on chronic systolic CHF. EXAM: MYOCARDIAL IMAGING WITH SPECT (REST AND PHARMACOLOGIC-STRESS) GATED LEFT VENTRICULAR WALL MOTION STUDY LEFT VENTRICULAR EJECTION FRACTION TECHNIQUE: Standard myocardial SPECT imaging was performed after resting intravenous injection of 10 mCi Tc-13m tetrofosmin. Subsequently, intravenous infusion of Lexiscan was performed under the supervision of the Cardiology staff. At peak effect of the drug, 30 mCi Tc-52m tetrofosmin  was injected intravenously and standard myocardial SPECT imaging was performed. Quantitative gated imaging was also performed to evaluate left ventricular wall motion, and estimate left ventricular ejection fraction. COMPARISON:  None. FINDINGS: Perfusion: There is a large fixed defect involving the inferolateral wall compatible with an infarct. There is no evidence to suggest reversibility or ischemia. Left ventricle is dilated. Wall Motion: There is markedly abnormal wall motion throughout the left ventricle. Severe hypokinesia throughout the left ventricle and difficult to exclude areas of paradoxical motion due to the minimal wall motion. Left Ventricular Ejection Fraction: 7 % End diastolic volume 130 ml End systolic volume 865 ml IMPRESSION: 1. Large infarct involving the inferolateral wall. No evidence for ischemia or reversibility. 2. Markedly abnormal wall motion throughout the left ventricle. Severe hypokinesia throughout the left ventricle. 3. Left ventricular ejection fraction is 7%. 4. Non invasive risk stratification*: High *2012 Appropriate Use Criteria for Coronary Revascularization Focused Update: J Am Coll Cardiol. 7846;96(2):952-841. http://content.[email or web address]?[email or web address] Electronically Signed   By: Markus Daft M.D.   On: 03/29/2016 13:52    PHYSICAL EXAM General: NAD Neck: JVP 14-16 cm, no thyromegaly or thyroid nodule.  Lungs: Clear to auscultation bilaterally with normal respiratory effort. CV: Lateral PMI.  Heart tachy, irregular S1/S2, no S3/S4, no murmur.  No peripheral edema.  Abdomen: Soft, nontender, no hepatosplenomegaly, mild distention.  Neurologic: Alert and oriented x 3.  Psych: Normal affect. Extremities: No clubbing or cyanosis.   TELEMETRY: Reviewed telemetry pt in atrial flutter, rate in 100s  ASSESSMENT AND PLAN: 46 yo with long history of nonischemic cardiomyopathy, HTN, DM, and CKD stage III presented with exertional dyspnea/CHF  exacerbation and was noted to have had ICD discharge on 1/14 for polymorphic VT.  1. Atrial flutter with mild RVR: Newly noted, was in atrial flutter at admission.  Given severe cardiomyopathy, he will not tolerate this well. This may have triggered his current CHF exacerbation.  - Continue amiodarone, begun at admission for VT.  - Continue Coreg. - Continue apixaban.   - Plan for atrial flutter ablation on Friday with TEE prior, Dr Lovena Le to see.  2. Acute on chronic systolic CHF: EF 32-44% on echo with moderate to severely decreased RV systolic function.  Possibly hypertensive cardiomyopathy.  Had cath around time of diagnosis in 2006 without significant coronary disease.  Cardiolite 1/17 showed EF 7% with inferolateral fixed defect, no  ischemia (possible artifact).  He remains volume overloaded on exam, BP is stable.  - Lasix 80 mg IV bid + dose of metolazone this morning.  Replace K.  - Continue hydralazine + Imdur.  - Continue Coreg and lisinopril. Would consider transition to [facility] down the road. - Continue spironolactone 25.   3. CKD: Stage III.  Creatinine stable.  Baseline elevation likely related to HTN and DM.  Watch carefully with diuresis.  4. DM: Poor control.  He does not know who manages this at home (?no PCP).  Has just been taking metformin.  On insulin in hospital, adjusting with help of diabetes nurse coordinator.  Need care management assistance for home health and to see if we can find him a PCP.   5. VT: Polymorphic VT on 1/14 with ICD discharge, VT on 01/11/16 with ICD discharge.  Suspect related to CHF exacerbation rather than ischemia.  Amiodarone started.   6. Patient seems to have very poor understanding of his health issues.  Aside from his cardiac problems, he has uncontrolled diabetes and does not know who is supposed to be managing this as an outpatient.  May benefit from paramedicine and will see if we can get home health and assistance with finding a PCP.   Loralie Champagne 03/30/2016 8:46 AM

## 2016-03-30 NOTE — Progress Notes (Signed)
Inpatient Diabetes Program Recommendations  AACE/ADA: New Consensus Statement on Inpatient Glycemic Control (2015)  Target Ranges:  Prepandial:   less than 140 mg/dL      Peak postprandial:   less than 180 mg/dL (1-2 hours)      Critically ill patients:  140 - 180 mg/dL   Lab Results  Component Value Date   GLUCAP 236 (H) 03/30/2016   HGBA1C >14.0 07/28/2010    Review of Glycemic Control  Spoke to pt again this am. Seems more open to going home on insulin. States he knows how to give himself insulin because his mother had DM and had given her insulin in the past. Will ask RN to allow pt to give his own insulin injections and prick finger for glucose monitoring. States he has several meters at home with strips, lancets. Discussed importance of checking blood sugars several times/day and taking logbook to PCP for needed adjustments.   Inpatient Diabetes Program Recommendations:    Add CHO mod medium to heart healthy diet. Increase Lantus to 25 units QHS Increase Novolog to 6 units tidwc for meal coverage insulin Add HS correction.  RN please allow pt to give insulin injections and check blood sugars. Will follow.  Thank you. Lorenda Peck, RD, LDN, CDE Inpatient Diabetes Coordinator [phone]

## 2016-03-30 NOTE — Progress Notes (Signed)
SUBJECTIVE: The patient is improved today.  At this time, he denies chest pain, shortness of breath, or any new concerns.  Shortness of breath improved since admission.  CURRENT MEDICATIONS: . amiodarone  400 mg Oral BID  . apixaban  5 mg Oral BID  . carvedilol  25 mg Oral BID WC  . furosemide  80 mg Intravenous BID  . hydrALAZINE  50 mg Oral TID  . insulin aspart  0-20 Units Subcutaneous TID WC  . insulin aspart  5 Units Subcutaneous TID WC  . insulin glargine  20 Units Subcutaneous QHS  . isosorbide mononitrate  30 mg Oral Daily  . lisinopril  40 mg Oral Daily  . magnesium oxide  400 mg Oral Daily  . mouth rinse  15 mL Mouth Rinse BID  . metolazone  5 mg Oral Once  . potassium chloride  40 mEq Oral BID  . potassium chloride  40 mEq Oral Once  . regadenoson  0.4 mg Intravenous Once  . spironolactone  25 mg Oral Daily     OBJECTIVE: Physical Exam: Vitals:   03/29/16 0951 03/29/16 2116 03/30/16 0059 03/30/16 0534  BP: (!) 142/97 124/78 129/68 127/81  Pulse: (!) 111 86  89  Resp:  18 18 18   Temp:  98.2 F (36.8 C) 98.2 F (36.8 C) 97.6 F (36.4 C)  TempSrc:  Oral Oral Oral  SpO2:  100% 100% 98%  Weight:    229 lb 6.4 oz (104.1 kg)    Intake/Output Summary (Last 24 hours) at 03/30/16 5631 Last data filed at 03/30/16 0215  Gross per 24 hour  Intake              480 ml  Output             2300 ml  Net            -1820 ml    Telemetry reveals atrial flutter with V rates 90-110  GEN- The patient is well appearing, alert and oriented x 3 today.   Head- normocephalic, atraumatic Eyes-  Sclera clear, conjunctiva pink Ears- hearing intact Oropharynx- clear Neck- supple  Lungs- Clear to ausculation bilaterally, normal work of breathing Heart- Tachycardic regular rate and rhythm  GI- soft, NT, ND, + BS Extremities- no clubbing, cyanosis, or edema Skin- no rash or lesion Psych- euthymic mood, full affect Neuro- strength and sensation are intact  LABS: Basic  Metabolic Panel:  Recent Labs  03/28/16 1011  03/29/16 1611 03/30/16 0533  NA  --   < > 138 136  K  --   < > 3.4* 3.7  CL  --   < > 100* 97*  CO2  --   < > 32 30  GLUCOSE  --   < > 127* 295*  BUN  --   < > 15 20  CREATININE  --   < > 1.62* 1.76*  CALCIUM  --   < > 8.7* 9.0  MG 1.6*  --   --   --   < > = values in this interval not displayed. Liver Function Tests:  Recent Labs  03/28/16 0407  AST 14*  ALT 10*  ALKPHOS 124  BILITOT 3.0*  PROT 7.0  ALBUMIN 3.0*    CBC:  Recent Labs  03/28/16 0407  WBC 7.0  NEUTROABS 4.8  HGB 13.9  HCT 40.9  MCV 88.9  PLT 243   Cardiac Enzymes:  Recent Labs  03/28/16 0407  TROPONINI 0.05*  RADIOLOGY: Dg Chest 2 View Result Date: 03/28/2016 CLINICAL DATA:  Shortness of breath EXAM: CHEST  2 VIEW COMPARISON:  04/25/2007 FINDINGS: Left-sided single lead pacing device with lead over right ventricle. Moderate cardiomegaly without overt failure. No acute infiltrate, consolidation, or pleural effusion. No pneumothorax peer IMPRESSION: Cardiomegaly without overt failure.  No acute infiltrate Electronically Signed   By: Donavan Foil M.D.   On: 03/28/2016 01:55   Nm Myocar Multi W/spect W/wall Motion / Ef Result Date: 03/29/2016 CLINICAL DATA:  History of cardiomyopathy and atrial flutter. Acute on chronic systolic CHF. EXAM: MYOCARDIAL IMAGING WITH SPECT (REST AND PHARMACOLOGIC-STRESS) GATED LEFT VENTRICULAR WALL MOTION STUDY LEFT VENTRICULAR EJECTION FRACTION TECHNIQUE: Standard myocardial SPECT imaging was performed after resting intravenous injection of 10 mCi Tc-3m tetrofosmin. Subsequently, intravenous infusion of Lexiscan was performed under the supervision of the Cardiology staff. At peak effect of the drug, 30 mCi Tc-41m tetrofosmin was injected intravenously and standard myocardial SPECT imaging was performed. Quantitative gated imaging was also performed to evaluate left ventricular wall motion, and estimate left ventricular  ejection fraction. COMPARISON:  None. FINDINGS: Perfusion: There is a large fixed defect involving the inferolateral wall compatible with an infarct. There is no evidence to suggest reversibility or ischemia. Left ventricle is dilated. Wall Motion: There is markedly abnormal wall motion throughout the left ventricle. Severe hypokinesia throughout the left ventricle and difficult to exclude areas of paradoxical motion due to the minimal wall motion. Left Ventricular Ejection Fraction: 7 % End diastolic volume 456 ml End systolic volume 256 ml IMPRESSION: 1. Large infarct involving the inferolateral wall. No evidence for ischemia or reversibility. 2. Markedly abnormal wall motion throughout the left ventricle. Severe hypokinesia throughout the left ventricle. 3. Left ventricular ejection fraction is 7%. 4. Non invasive risk stratification*: High *2012 Appropriate Use Criteria for Coronary Revascularization Focused Update: J Am Coll Cardiol. 3893;73(4):287-681. http://[email or web address]?[email or web address] Electronically Signed   By: Markus Daft M.D.   On: 03/29/2016 13:52    ASSESSMENT AND PLAN:  Active Problems:   Acute on chronic clinical systolic heart failure ([facility])   AKI (acute kidney injury) ([place])   Typical atrial flutter ([place])   1.  Atrial flutter Newly diagnosed this admission. He has been placed on Eliquis for CHADS2VASC of at least 2.   Dr Lovena Le discussed with Dr Aundra Dubin who feels that flutter is exacerbating HF and patient would do better in SR.  Discussed treatment options with patient today. Will plan for TEE/flutter ablation tomorrow with Dr Lovena Le. Risks, benefits reviewed with patient who wishes to proceed.  2.  PMVT No recurrence this admission Continue amiodarone for now Would have low threshold for discontinuing once HF status stabilized  3.  CKD stage III Stable No change required today  4.  Acute on chronic systolic heart failure Appreciate AHF team Myoview  yesterday showed infarct but no ischemia, EF 7% Continue medical therapy  Chanetta Marshall, NP 03/30/2016 9:21 AM  EP Attending  Patient seen and examined. Agree with above. His atrial flutter is rate controlled. I have discussed the treatment options and we will plan to proceed with TEE guided flutter ablation tomorrow. I have discussed the indications/risks/benefits with the patient and he is willing to proceed.   Mikle Bosworth.D.

## 2016-03-30 NOTE — Care Management Note (Signed)
Case Management Note  Patient Details  Name: Travis Bryan MRN: [id number] Date of Birth: 10-18-1970  Subjective/Objective:      Admitted with CHF              Action/Plan: Patient is independent of his ADL, lives at home with his Travis Bryan and Travis Bryan, he works full time at  [employer] and stated that he has [employer] with prescription drug coverage; Patient stated that he just got his medical insurance and does not know exactly where his card is ( somewhere in his car) CM informed him to carry his insurance card in his wallet if possible; No PCP; CM offered him to contact his Aunt and Uncle to help find his insurance card so that I could find him a PCP; patient stated that he will find his own doctor; His uncle cooks a low sodium diet; CM explained the importance of weighing himself daily. Patient stated that " my doctor has fixed my heart." I talked to him about taking care of himself is an ongoing process and a lot depends on how well he takes care of himself at home. Lots of education needed; Unable to arrange [place] due to patient is not homebound, he is independent and works everyday 40 + hours a week; CM will continue to follow for DCP.  Expected Discharge Date:   possibly 04/02/2016               Expected Discharge Plan:  Home/Self Care  Discharge planning Services  CM Consult  Status of Service:  In process, will continue to follow  Sherrilyn Rist [phone] 03/30/2016, 11:19 AM

## 2016-03-30 NOTE — Progress Notes (Signed)
Discussed, observed and teach back was done with pt on insulin injection today with afternoon insulin dose. Pt able to give self insulin injection in thigh. Pt reluctant to give self injection in abdomen. Encouraged to try with next injections needed. Pt needs further practice.  Education video # [phone] started at 75 (pt educated on how to dial number and request video by number using room telephone and tv).  Pt to watch other videos following this one.  Will report to oncoming RN on education and video viewing.

## 2016-03-31 ENCOUNTER — Inpatient Hospital Stay (HOSPITAL_COMMUNITY): Payer: BLUE CROSS/BLUE SHIELD

## 2016-03-31 ENCOUNTER — Encounter (HOSPITAL_COMMUNITY)
Admission: EM | Disposition: A | Discharge status: Home / Self Care | Payer: Self-pay | Source: Home / Self Care | Attending: Cardiovascular Disease

## 2016-03-31 ENCOUNTER — Encounter (HOSPITAL_COMMUNITY): Payer: Self-pay

## 2016-03-31 DIAGNOSIS — I483 Typical atrial flutter: Secondary | ICD-10-CM

## 2016-03-31 DIAGNOSIS — I4892 Unspecified atrial flutter: Secondary | ICD-10-CM

## 2016-03-31 HISTORY — PX: ELECTROPHYSIOLOGIC STUDY: SHX172A

## 2016-03-31 HISTORY — PX: TEE WITHOUT CARDIOVERSION: SHX5443

## 2016-03-31 LAB — GLUCOSE, CAPILLARY
GLUCOSE-CAPILLARY: 140 mg/dL — AB (ref 65–99)
Glucose-Capillary: 172 mg/dL — ABNORMAL HIGH (ref 65–99)
Glucose-Capillary: 120 mg/dL — ABNORMAL HIGH (ref 65–99)
Glucose-Capillary: 230 mg/dL — ABNORMAL HIGH (ref 65–99)

## 2016-03-31 LAB — BASIC METABOLIC PANEL
Anion gap: 8 (ref 5–15)
BUN: 26 mg/dL — AB (ref 6–20)
CO2: 31 mmol/L (ref 22–32)
CREATININE: 2.13 mg/dL — AB (ref 0.61–1.24)
Calcium: 9.4 mg/dL (ref 8.9–10.3)
Chloride: 97 mmol/L — ABNORMAL LOW (ref 101–111)
GFR calc Af Amer: 41 mL/min — ABNORMAL LOW (ref >60)
GFR, EST NON AFRICAN AMERICAN: 36 mL/min — AB (ref >60)
Glucose, Bld: 188 mg/dL — ABNORMAL HIGH (ref 65–99)
Potassium: 3.5 mmol/L (ref 3.5–5.1)
SODIUM: 136 mmol/L (ref 135–145)

## 2016-03-31 LAB — HEMOGLOBIN A1C
Hgb A1c MFr Bld: 13.4 % — ABNORMAL HIGH (ref 4.8–5.6)
MEAN PLASMA GLUCOSE: 338 mg/dL

## 2016-03-31 SURGERY — A-FLUTTER ABLATION
Anesthesia: LOCAL

## 2016-03-31 SURGERY — ECHOCARDIOGRAM, TRANSESOPHAGEAL
Anesthesia: Moderate Sedation

## 2016-03-31 MED ORDER — BUPIVACAINE HCL (PF) 0.25 % IJ SOLN
INTRAMUSCULAR | Status: AC
  Filled 2016-03-31: qty 30

## 2016-03-31 MED ORDER — METOLAZONE 2.5 MG PO TABS: 2.5000 mg | ORAL_TABLET | Freq: Once | ORAL | Status: DC

## 2016-03-31 MED ORDER — HEPARIN (PORCINE) IN NACL 2-0.9 UNIT/ML-% IJ SOLN
INTRAMUSCULAR | Status: AC
  Filled 2016-03-31: qty 500

## 2016-03-31 MED ORDER — FENTANYL CITRATE (PF) 100 MCG/2ML IJ SOLN
INTRAMUSCULAR | Status: DC | PRN
  Administered 2016-03-31 (×6): 12.5 ug via INTRAVENOUS

## 2016-03-31 MED ORDER — INSULIN GLARGINE 100 UNIT/ML ~~LOC~~ SOLN
25.0000 [IU] | Freq: Every day | SUBCUTANEOUS | Status: DC
  Administered 2016-03-31 – 2016-04-13 (×14): 25 [IU] via SUBCUTANEOUS
  Filled 2016-03-31 (×14): qty 0.25

## 2016-03-31 MED ORDER — ACETAMINOPHEN 325 MG PO TABS: 650.0000 mg | ORAL_TABLET | ORAL | Status: DC | PRN

## 2016-03-31 MED ORDER — MIDAZOLAM HCL 5 MG/ML IJ SOLN
INTRAMUSCULAR | Status: AC
  Filled 2016-03-31: qty 2

## 2016-03-31 MED ORDER — INSULIN ASPART 100 UNIT/ML ~~LOC~~ SOLN
6.0000 [IU] | Freq: Three times a day (TID) | SUBCUTANEOUS | Status: DC
  Administered 2016-04-01 – 2016-04-14 (×33): 6 [IU] via SUBCUTANEOUS

## 2016-03-31 MED ORDER — SODIUM CHLORIDE 0.9% FLUSH
3.0000 mL | Freq: Two times a day (BID) | INTRAVENOUS | Status: DC
  Administered 2016-03-31 – 2016-04-02 (×5): 3 mL via INTRAVENOUS

## 2016-03-31 MED ORDER — MIDAZOLAM HCL 5 MG/5ML IJ SOLN
INTRAMUSCULAR | Status: DC | PRN
  Administered 2016-03-31 (×6): 1 mg via INTRAVENOUS

## 2016-03-31 MED ORDER — ISOSORBIDE MONONITRATE ER 60 MG PO TB24
60.0000 mg | ORAL_TABLET | Freq: Every day | ORAL | Status: DC
  Administered 2016-03-31 – 2016-04-01 (×2): 60 mg via ORAL
  Filled 2016-03-31 (×2): qty 1

## 2016-03-31 MED ORDER — MIDAZOLAM HCL 5 MG/5ML IJ SOLN
INTRAMUSCULAR | Status: AC
Start: 2016-03-31 — End: 2016-03-31
  Filled 2016-03-31: qty 5

## 2016-03-31 MED ORDER — HYDRALAZINE HCL 25 MG PO TABS
75.0000 mg | ORAL_TABLET | Freq: Three times a day (TID) | ORAL | Status: DC
  Administered 2016-03-31 – 2016-04-01 (×3): 75 mg via ORAL
  Filled 2016-03-31 (×3): qty 1

## 2016-03-31 MED ORDER — SODIUM CHLORIDE 0.9% FLUSH: 3.0000 mL | INTRAVENOUS | Status: DC | PRN

## 2016-03-31 MED ORDER — FENTANYL CITRATE (PF) 100 MCG/2ML IJ SOLN
INTRAMUSCULAR | Status: DC | PRN
  Administered 2016-03-31 (×2): 25 ug via INTRAVENOUS

## 2016-03-31 MED ORDER — FENTANYL CITRATE (PF) 100 MCG/2ML IJ SOLN
INTRAMUSCULAR | Status: AC
  Filled 2016-03-31: qty 2

## 2016-03-31 MED ORDER — ONDANSETRON HCL 4 MG/2ML IJ SOLN: 4.0000 mg | Freq: Four times a day (QID) | INTRAMUSCULAR | Status: DC | PRN

## 2016-03-31 MED ORDER — SODIUM CHLORIDE 0.9 % IV SOLN
250.0000 mL | INTRAVENOUS | Status: DC | PRN
  Administered 2016-04-02: 250 mL via INTRAVENOUS

## 2016-03-31 MED ORDER — BUTAMBEN-TETRACAINE-BENZOCAINE 2-2-14 % EX AERO
INHALATION_SPRAY | CUTANEOUS | Status: DC | PRN
  Administered 2016-03-31: 2 via TOPICAL

## 2016-03-31 MED ORDER — BUPIVACAINE HCL (PF) 0.25 % IJ SOLN
INTRAMUSCULAR | Status: DC | PRN
  Administered 2016-03-31: 45 mL

## 2016-03-31 MED ORDER — MIDAZOLAM HCL 10 MG/2ML IJ SOLN
INTRAMUSCULAR | Status: DC | PRN
  Administered 2016-03-31: 1 mg via INTRAVENOUS
  Administered 2016-03-31 (×2): 2 mg via INTRAVENOUS

## 2016-03-31 MED ORDER — HEPARIN (PORCINE) IN NACL 2-0.9 UNIT/ML-% IJ SOLN
INTRAMUSCULAR | Status: DC | PRN
  Administered 2016-03-31: 500 mL via INTRAVENOUS

## 2016-03-31 SURGICAL SUPPLY — 12 items
BAG SNAP BAND KOVER 36X36 (MISCELLANEOUS) ×1 IMPLANT
CATH BLAZERPRIME XP (ABLATOR) ×1 IMPLANT
CATH DUODECA HALO/ISMUS 7FR (CATHETERS) ×1 IMPLANT
CATH JOSEPHSON QUAD-ALLRED 6FR (CATHETERS) ×1 IMPLANT
CATH POLARIS X 2.5/5/2.5 DECAP (CATHETERS) ×1 IMPLANT
PACK EP LATEX FREE (CUSTOM PROCEDURE TRAY) ×2
PACK EP LF (CUSTOM PROCEDURE TRAY) IMPLANT
PAD DEFIB LIFELINK (PAD) ×1 IMPLANT
SHEATH PINNACLE 6F 10CM (SHEATH) ×1 IMPLANT
SHEATH PINNACLE 7F 10CM (SHEATH) ×2 IMPLANT
SHEATH PINNACLE 8F 10CM (SHEATH) ×2 IMPLANT
SHIELD RADPAD SCOOP 12X17 (MISCELLANEOUS) ×1 IMPLANT

## 2016-03-31 NOTE — Interval H&P Note (Signed)
History and Physical Interval Note:  03/31/2016 2:00 PM  Travis Bryan  has presented today for surgery, with the diagnosis of flutter  The various methods of treatment have been discussed with the patient and family. After consideration of risks, benefits and other options for treatment, the patient has consented to  Procedure(s): A-Flutter Ablation (N/A) as a surgical intervention .  The patient's history has been reviewed, patient examined, no change in status, stable for surgery.  I have reviewed the patient's chart and labs.  Questions were answered to the patient's satisfaction.     Cristopher Peru

## 2016-03-31 NOTE — Interval H&P Note (Signed)
History and Physical Interval Note:  03/31/2016 12:43 PM  Travis Bryan  has presented today for surgery, with the diagnosis of atrial flutter  The various methods of treatment have been discussed with the patient and family. After consideration of risks, benefits and other options for treatment, the patient has consented to  Procedure(s): TRANSESOPHAGEAL ECHOCARDIOGRAM (TEE) (N/A) as a surgical intervention .  The patient's history has been reviewed, patient examined, no change in status, stable for surgery.  I have reviewed the patient's chart and labs.  Questions were answered to the patient's satisfaction.     Berkeley Veldman [employer]

## 2016-03-31 NOTE — Progress Notes (Signed)
Referral received for patient for HF [employer].  I have sent the referral form via secure email to the HF [employer].  Patient will follow in the AHF Clinic after discharge and I will call / follow-up with patient after discharge for any further needs.

## 2016-03-31 NOTE — CV Procedure (Signed)
Procedure: TEE  Indication: Atrial flutter, pre-ablation  Sedation: Versed 5 mg IV, Fentanyl 50 mcg IV  Findings: Please see echo section for full report.  Mildly dilated LV with normal wall thickness.  EF 15-20% with diffuse hypokinesis.  Mildly dilated RV with moderate to severely decreased systolic function.  Trivial TR.  Trivial MR.  Trileaflet aortic valve with no stenosis, trivial aortic insufficiency.  Moderate left atrial enlargement with no LA appendage thrombus noted.  There was smoke in the LA.  Mild right atrial enlargement.  No evidence for PFO/ASD by color doppler.  Normal caliber aorta with mild plaque.   Impression: No LAA thrombus, may proceed to flutter ablation.   Travis Bryan 03/31/2016 1:00 PM

## 2016-03-31 NOTE — Progress Notes (Signed)
Site area: Rt fem venous sheaths x3removed  Site Prior to Removal:  Level 0 Pressure Applied For: 30 min Manual: yes   Patient Status During Pull: A/O  Post Pull Site:  Level 0 Post Pull Instructions Given:  Pt understands post instructions Post Pull Pulses Present: RT Pt 2+ Dressing Applied:  tegaderm and a 4x4 Bedrest begins @ 16:55:00 Comments: Pt leaves cath lab holding in stable condition. Rt IJ and Rt Groin unremarkable. Rt IJ and Rt groin dressing is CDI.

## 2016-03-31 NOTE — Progress Notes (Signed)
Pt returns from cath lab. R IJ site is stable no hematoma or blood present. Pt R groin site stable, no hematoma or blood noted. Pt instructed he is on bedrest till 2130. Vital signs taken. Will continue to monitor.

## 2016-03-31 NOTE — Progress Notes (Signed)
Echocardiogram Echocardiogram Transesophageal has been performed.  Aggie Cosier 03/31/2016, 1:14 PM

## 2016-03-31 NOTE — H&P (View-Only) (Signed)
Patient ID: Travis Bryan, male   DOB: 05-10-1970, 46 y.o.   MRN: [id number]   SUBJECTIVE: Patient remains in atrial flutter, rate controlled.  He diuresed well yesterday, says breathing is better.    Cardiolite: EF 7%, inferolateral fixed defect no ischemia.   Scheduled Meds: . amiodarone  400 mg Oral BID  . apixaban  5 mg Oral BID  . carvedilol  25 mg Oral BID WC  . furosemide  80 mg Intravenous BID  . hydrALAZINE  50 mg Oral TID  . insulin aspart  0-20 Units Subcutaneous TID WC  . insulin aspart  6 Units Subcutaneous TID WC  . insulin glargine  25 Units Subcutaneous QHS  . isosorbide mononitrate  30 mg Oral Daily  . lisinopril  40 mg Oral Daily  . magnesium oxide  400 mg Oral Daily  . mouth rinse  15 mL Mouth Rinse BID  . metolazone  2.5 mg Oral Once  . potassium chloride  40 mEq Oral BID  . regadenoson  0.4 mg Intravenous Once  . spironolactone  25 mg Oral Daily   Continuous Infusions: . sodium chloride     PRN Meds:.acetaminophen, ondansetron (ZOFRAN) IV    Vitals:   03/30/16 1811 03/30/16 2002 03/31/16 0508 03/31/16 0514  BP: 115/88 122/80 122/90   Pulse:  99 100   Resp:  18 16   Temp:  98.5 F (36.9 C) 98.1 F (36.7 C)   TempSrc:  Oral Oral   SpO2:  100% 100%   Weight:    225 lb 9.6 oz (102.3 kg)    Intake/Output Summary (Last 24 hours) at 03/31/16 0755 Last data filed at 03/31/16 0600  Gross per 24 hour  Intake              720 ml  Output             3725 ml  Net            -3005 ml    LABS: Basic Metabolic Panel:  Recent Labs  03/28/16 1011  03/29/16 1611 03/30/16 0533  NA  --   < > 138 136  K  --   < > 3.4* 3.7  CL  --   < > 100* 97*  CO2  --   < > 32 30  GLUCOSE  --   < > 127* 295*  BUN  --   < > 15 20  CREATININE  --   < > 1.62* 1.76*  CALCIUM  --   < > 8.7* 9.0  MG 1.6*  --   --   --   < > = values in this interval not displayed. Liver Function Tests: No results for input(s): AST, ALT, ALKPHOS, BILITOT, PROT, ALBUMIN in the last 72  hours. No results for input(s): LIPASE, AMYLASE in the last 72 hours. CBC: No results for input(s): WBC, NEUTROABS, HGB, HCT, MCV, PLT in the last 72 hours. Cardiac Enzymes: No results for input(s): CKTOTAL, CKMB, CKMBINDEX, TROPONINI in the last 72 hours. BNP: Invalid input(s): POCBNP D-Dimer: No results for input(s): DDIMER in the last 72 hours. Hemoglobin A1C:  Recent Labs  03/30/16 0800  HGBA1C 13.4*   Fasting Lipid Panel: No results for input(s): CHOL, HDL, LDLCALC, TRIG, CHOLHDL, LDLDIRECT in the last 72 hours. Thyroid Function Tests: No results for input(s): TSH, T4TOTAL, T3FREE, THYROIDAB in the last 72 hours.  Invalid input(s): FREET3 Anemia Panel: No results for input(s): VITAMINB12, FOLATE, FERRITIN, TIBC, IRON, RETICCTPCT in  the last 72 hours.  RADIOLOGY: Dg Chest 2 View  Result Date: 03/28/2016 CLINICAL DATA:  Shortness of breath EXAM: CHEST  2 VIEW COMPARISON:  04/25/2007 FINDINGS: Left-sided single lead pacing device with lead over right ventricle. Moderate cardiomegaly without overt failure. No acute infiltrate, consolidation, or pleural effusion. No pneumothorax peer IMPRESSION: Cardiomegaly without overt failure.  No acute infiltrate Electronically Signed   By: Donavan Foil M.D.   On: 03/28/2016 01:55   Nm Myocar Multi W/spect W/wall Motion / Ef  Result Date: 03/29/2016 CLINICAL DATA:  History of cardiomyopathy and atrial flutter. Acute on chronic systolic CHF. EXAM: MYOCARDIAL IMAGING WITH SPECT (REST AND PHARMACOLOGIC-STRESS) GATED LEFT VENTRICULAR WALL MOTION STUDY LEFT VENTRICULAR EJECTION FRACTION TECHNIQUE: Standard myocardial SPECT imaging was performed after resting intravenous injection of 10 mCi Tc-65m tetrofosmin. Subsequently, intravenous infusion of Lexiscan was performed under the supervision of the Cardiology staff. At peak effect of the drug, 30 mCi Tc-15m tetrofosmin was injected intravenously and standard myocardial SPECT imaging was performed.  Quantitative gated imaging was also performed to evaluate left ventricular wall motion, and estimate left ventricular ejection fraction. COMPARISON:  None. FINDINGS: Perfusion: There is a large fixed defect involving the inferolateral wall compatible with an infarct. There is no evidence to suggest reversibility or ischemia. Left ventricle is dilated. Wall Motion: There is markedly abnormal wall motion throughout the left ventricle. Severe hypokinesia throughout the left ventricle and difficult to exclude areas of paradoxical motion due to the minimal wall motion. Left Ventricular Ejection Fraction: 7 % End diastolic volume 938 ml End systolic volume 101 ml IMPRESSION: 1. Large infarct involving the inferolateral wall. No evidence for ischemia or reversibility. 2. Markedly abnormal wall motion throughout the left ventricle. Severe hypokinesia throughout the left ventricle. 3. Left ventricular ejection fraction is 7%. 4. Non invasive risk stratification*: High *2012 Appropriate Use Criteria for Coronary Revascularization Focused Update: J Am Coll Cardiol. 7510;25(8):527-782. http://[email or web address]?[email or web address] Electronically Signed   By: Markus Daft M.D.   On: 03/29/2016 13:52    PHYSICAL EXAM General: NAD Neck: JVP 14 cm, no thyromegaly or thyroid nodule.  Lungs: Clear to auscultation bilaterally with normal respiratory effort. CV: Lateral PMI.  Heart irregular S1/S2, no S3/S4, no murmur.  No peripheral edema.  Abdomen: Soft, nontender, no hepatosplenomegaly, mild distention.  Neurologic: Alert and oriented x 3.  Psych: Normal affect. Extremities: No clubbing or cyanosis.   TELEMETRY: Reviewed telemetry pt in atrial flutter, rate in 70s  ASSESSMENT AND PLAN: 46 yo with long history of nonischemic cardiomyopathy, HTN, DM, and CKD stage III presented with exertional dyspnea/CHF exacerbation and was noted to have had ICD discharge on 1/14 for polymorphic VT.  1. Atrial flutter  with mild RVR: Newly noted, was in atrial flutter at admission.  Given severe cardiomyopathy, he will not tolerate this well. This may have triggered his current CHF exacerbation.  - Continue amiodarone, begun at admission for VT.  - Continue Coreg. - Continue apixaban.   - Plan for atrial flutter ablation on Friday with TEE prior, Dr Lovena Le to see.  2. Acute on chronic systolic CHF: EF 42-35% on echo with moderate to severely decreased RV systolic function.  Possibly hypertensive cardiomyopathy.  Had cath around time of diagnosis in 2006 without significant coronary disease.  Cardiolite 1/17 showed EF 7% with inferolateral fixed defect, no ischemia (possible artifact).  He remains volume overloaded on exam, BP is stable. He diuresed well yesterday on Lasix + metolazone, still pending labs. - Lasix 80 mg IV bid +  dose of metolazone this morning.  Replace K when BMET is back.   - Continue hydralazine + Imdur.  - Continue Coreg and lisinopril. Would consider transition to [facility] down the road. - Continue spironolactone 25.   3. CKD: Stage III.  Creatinine has been stable but pending BMET today.  Baseline elevation likely related to HTN and DM.  Watch carefully with diuresis.  4. DM: Poor control => hgbA1c 13.6.  He does not know who manages this at home (?no PCP).  Has just been taking metformin.  On insulin in hospital, adjusting with help of diabetes nurse coordinator.  Need care management assistance for home health and to see if we can find him a PCP.   5. VT: Polymorphic VT on 1/14 with ICD discharge, VT on 01/11/16 with ICD discharge.  Suspect related to CHF exacerbation rather than ischemia.  Amiodarone started.   6. Patient seems to have very poor understanding of his health issues.  Aside from his cardiac problems, he has uncontrolled diabetes and does not know who is supposed to be managing this as an outpatient.  May benefit from paramedicine and will see if we can get home health and  assistance with finding a PCP.   Loralie Champagne 03/31/2016 7:55 AM   Creatinine came back 2.1 this morning though he is still volume overloaded.  Will give IV Lasix but hold off on metolazone.  Stop lisinopril for now and increase hydralazine to 75 mg tid and Imdur to 60 daily.   Loralie Champagne 03/31/2016 8:44 AM

## 2016-03-31 NOTE — H&P (View-Only) (Signed)
SUBJECTIVE: The patient is improved today.  At this time, he denies chest pain, shortness of breath, or any new concerns.  Shortness of breath improved since admission.  CURRENT MEDICATIONS: . amiodarone  400 mg Oral BID  . apixaban  5 mg Oral BID  . carvedilol  25 mg Oral BID WC  . furosemide  80 mg Intravenous BID  . hydrALAZINE  50 mg Oral TID  . insulin aspart  0-20 Units Subcutaneous TID WC  . insulin aspart  5 Units Subcutaneous TID WC  . insulin glargine  20 Units Subcutaneous QHS  . isosorbide mononitrate  30 mg Oral Daily  . lisinopril  40 mg Oral Daily  . magnesium oxide  400 mg Oral Daily  . mouth rinse  15 mL Mouth Rinse BID  . metolazone  5 mg Oral Once  . potassium chloride  40 mEq Oral BID  . potassium chloride  40 mEq Oral Once  . regadenoson  0.4 mg Intravenous Once  . spironolactone  25 mg Oral Daily     OBJECTIVE: Physical Exam: Vitals:   03/29/16 0951 03/29/16 2116 03/30/16 0059 03/30/16 0534  BP: (!) 142/97 124/78 129/68 127/81  Pulse: (!) 111 86  89  Resp:  18 18 18   Temp:  98.2 F (36.8 C) 98.2 F (36.8 C) 97.6 F (36.4 C)  TempSrc:  Oral Oral Oral  SpO2:  100% 100% 98%  Weight:    229 lb 6.4 oz (104.1 kg)    Intake/Output Summary (Last 24 hours) at 03/30/16 5009 Last data filed at 03/30/16 0215  Gross per 24 hour  Intake              480 ml  Output             2300 ml  Net            -1820 ml    Telemetry reveals atrial flutter with V rates 90-110  GEN- The patient is well appearing, alert and oriented x 3 today.   Head- normocephalic, atraumatic Eyes-  Sclera clear, conjunctiva pink Ears- hearing intact Oropharynx- clear Neck- supple  Lungs- Clear to ausculation bilaterally, normal work of breathing Heart- Tachycardic regular rate and rhythm  GI- soft, NT, ND, + BS Extremities- no clubbing, cyanosis, or edema Skin- no rash or lesion Psych- euthymic mood, full affect Neuro- strength and sensation are intact  LABS: Basic  Metabolic Panel:  Recent Labs  03/28/16 1011  03/29/16 1611 03/30/16 0533  NA  --   < > 138 136  K  --   < > 3.4* 3.7  CL  --   < > 100* 97*  CO2  --   < > 32 30  GLUCOSE  --   < > 127* 295*  BUN  --   < > 15 20  CREATININE  --   < > 1.62* 1.76*  CALCIUM  --   < > 8.7* 9.0  MG 1.6*  --   --   --   < > = values in this interval not displayed. Liver Function Tests:  Recent Labs  03/28/16 0407  AST 14*  ALT 10*  ALKPHOS 124  BILITOT 3.0*  PROT 7.0  ALBUMIN 3.0*    CBC:  Recent Labs  03/28/16 0407  WBC 7.0  NEUTROABS 4.8  HGB 13.9  HCT 40.9  MCV 88.9  PLT 243   Cardiac Enzymes:  Recent Labs  03/28/16 0407  TROPONINI 0.05*  RADIOLOGY: Dg Chest 2 View Result Date: 03/28/2016 CLINICAL DATA:  Shortness of breath EXAM: CHEST  2 VIEW COMPARISON:  04/25/2007 FINDINGS: Left-sided single lead pacing device with lead over right ventricle. Moderate cardiomegaly without overt failure. No acute infiltrate, consolidation, or pleural effusion. No pneumothorax peer IMPRESSION: Cardiomegaly without overt failure.  No acute infiltrate Electronically Signed   By: Donavan Foil M.D.   On: 03/28/2016 01:55   Nm Myocar Multi W/spect W/wall Motion / Ef Result Date: 03/29/2016 CLINICAL DATA:  History of cardiomyopathy and atrial flutter. Acute on chronic systolic CHF. EXAM: MYOCARDIAL IMAGING WITH SPECT (REST AND PHARMACOLOGIC-STRESS) GATED LEFT VENTRICULAR WALL MOTION STUDY LEFT VENTRICULAR EJECTION FRACTION TECHNIQUE: Standard myocardial SPECT imaging was performed after resting intravenous injection of 10 mCi Tc-30m tetrofosmin. Subsequently, intravenous infusion of Lexiscan was performed under the supervision of the Cardiology staff. At peak effect of the drug, 30 mCi Tc-65m tetrofosmin was injected intravenously and standard myocardial SPECT imaging was performed. Quantitative gated imaging was also performed to evaluate left ventricular wall motion, and estimate left ventricular  ejection fraction. COMPARISON:  None. FINDINGS: Perfusion: There is a large fixed defect involving the inferolateral wall compatible with an infarct. There is no evidence to suggest reversibility or ischemia. Left ventricle is dilated. Wall Motion: There is markedly abnormal wall motion throughout the left ventricle. Severe hypokinesia throughout the left ventricle and difficult to exclude areas of paradoxical motion due to the minimal wall motion. Left Ventricular Ejection Fraction: 7 % End diastolic volume 630 ml End systolic volume 160 ml IMPRESSION: 1. Large infarct involving the inferolateral wall. No evidence for ischemia or reversibility. 2. Markedly abnormal wall motion throughout the left ventricle. Severe hypokinesia throughout the left ventricle. 3. Left ventricular ejection fraction is 7%. 4. Non invasive risk stratification*: High *2012 Appropriate Use Criteria for Coronary Revascularization Focused Update: J Am Coll Cardiol. 1093;23(5):573-220. http://[email or web address] Electronically Signed   By: Markus Daft M.D.   On: 03/29/2016 13:52    ASSESSMENT AND PLAN:  Active Problems:   Acute on chronic clinical systolic heart failure (HCC)   AKI (acute kidney injury) ([place])   Typical atrial flutter ([place])   1.  Atrial flutter Newly diagnosed this admission. He has been placed on Eliquis for CHADS2VASC of at least 2.   Dr Lovena Le discussed with Dr Aundra Dubin who feels that flutter is exacerbating HF and patient would do better in SR.  Discussed treatment options with patient today. Will plan for TEE/flutter ablation tomorrow with Dr Lovena Le. Risks, benefits reviewed with patient who wishes to proceed.  2.  PMVT No recurrence this admission Continue amiodarone for now Would have low threshold for discontinuing once HF status stabilized  3.  CKD stage III Stable No change required today  4.  Acute on chronic systolic heart failure Appreciate AHF team Myoview  yesterday showed infarct but no ischemia, EF 7% Continue medical therapy  Chanetta Marshall, NP 03/30/2016 9:21 AM  EP Attending  Patient seen and examined. Agree with above. His atrial flutter is rate controlled. I have discussed the treatment options and we will plan to proceed with TEE guided flutter ablation tomorrow. I have discussed the indications/risks/benefits with the patient and he is willing to proceed.   Mikle Bosworth.D.

## 2016-03-31 NOTE — Progress Notes (Addendum)
Patient ID: Travis Bryan, male   DOB: 03-16-1970, 46 y.o.   MRN: [id number]   SUBJECTIVE: Patient remains in atrial flutter, rate controlled.  He diuresed well yesterday, says breathing is better.    Cardiolite: EF 7%, inferolateral fixed defect no ischemia.   Scheduled Meds: . amiodarone  400 mg Oral BID  . apixaban  5 mg Oral BID  . carvedilol  25 mg Oral BID WC  . furosemide  80 mg Intravenous BID  . hydrALAZINE  50 mg Oral TID  . insulin aspart  0-20 Units Subcutaneous TID WC  . insulin aspart  6 Units Subcutaneous TID WC  . insulin glargine  25 Units Subcutaneous QHS  . isosorbide mononitrate  30 mg Oral Daily  . lisinopril  40 mg Oral Daily  . magnesium oxide  400 mg Oral Daily  . mouth rinse  15 mL Mouth Rinse BID  . metolazone  2.5 mg Oral Once  . potassium chloride  40 mEq Oral BID  . regadenoson  0.4 mg Intravenous Once  . spironolactone  25 mg Oral Daily   Continuous Infusions: . sodium chloride     PRN Meds:.acetaminophen, ondansetron (ZOFRAN) IV    Vitals:   03/30/16 1811 03/30/16 2002 03/31/16 0508 03/31/16 0514  BP: 115/88 122/80 122/90   Pulse:  99 100   Resp:  18 16   Temp:  98.5 F (36.9 C) 98.1 F (36.7 C)   TempSrc:  Oral Oral   SpO2:  100% 100%   Weight:    225 lb 9.6 oz (102.3 kg)    Intake/Output Summary (Last 24 hours) at 03/31/16 0755 Last data filed at 03/31/16 0600  Gross per 24 hour  Intake              720 ml  Output             3725 ml  Net            -3005 ml    LABS: Basic Metabolic Panel:  Recent Labs  03/28/16 1011  03/29/16 1611 03/30/16 0533  NA  --   < > 138 136  K  --   < > 3.4* 3.7  CL  --   < > 100* 97*  CO2  --   < > 32 30  GLUCOSE  --   < > 127* 295*  BUN  --   < > 15 20  CREATININE  --   < > 1.62* 1.76*  CALCIUM  --   < > 8.7* 9.0  MG 1.6*  --   --   --   < > = values in this interval not displayed. Liver Function Tests: No results for input(s): AST, ALT, ALKPHOS, BILITOT, PROT, ALBUMIN in the last 72  hours. No results for input(s): LIPASE, AMYLASE in the last 72 hours. CBC: No results for input(s): WBC, NEUTROABS, HGB, HCT, MCV, PLT in the last 72 hours. Cardiac Enzymes: No results for input(s): CKTOTAL, CKMB, CKMBINDEX, TROPONINI in the last 72 hours. BNP: Invalid input(s): POCBNP D-Dimer: No results for input(s): DDIMER in the last 72 hours. Hemoglobin A1C:  Recent Labs  03/30/16 0800  HGBA1C 13.4*   Fasting Lipid Panel: No results for input(s): CHOL, HDL, LDLCALC, TRIG, CHOLHDL, LDLDIRECT in the last 72 hours. Thyroid Function Tests: No results for input(s): TSH, T4TOTAL, T3FREE, THYROIDAB in the last 72 hours.  Invalid input(s): FREET3 Anemia Panel: No results for input(s): VITAMINB12, FOLATE, FERRITIN, TIBC, IRON, RETICCTPCT in  the last 72 hours.  RADIOLOGY: Dg Chest 2 View  Result Date: 03/28/2016 CLINICAL DATA:  Shortness of breath EXAM: CHEST  2 VIEW COMPARISON:  04/25/2007 FINDINGS: Left-sided single lead pacing device with lead over right ventricle. Moderate cardiomegaly without overt failure. No acute infiltrate, consolidation, or pleural effusion. No pneumothorax peer IMPRESSION: Cardiomegaly without overt failure.  No acute infiltrate Electronically Signed   By: Donavan Foil M.D.   On: 03/28/2016 01:55   Nm Myocar Multi W/spect W/wall Motion / Ef  Result Date: 03/29/2016 CLINICAL DATA:  History of cardiomyopathy and atrial flutter. Acute on chronic systolic CHF. EXAM: MYOCARDIAL IMAGING WITH SPECT (REST AND PHARMACOLOGIC-STRESS) GATED LEFT VENTRICULAR WALL MOTION STUDY LEFT VENTRICULAR EJECTION FRACTION TECHNIQUE: Standard myocardial SPECT imaging was performed after resting intravenous injection of 10 mCi Tc-38m tetrofosmin. Subsequently, intravenous infusion of Lexiscan was performed under the supervision of the Cardiology staff. At peak effect of the drug, 30 mCi Tc-77m tetrofosmin was injected intravenously and standard myocardial SPECT imaging was performed.  Quantitative gated imaging was also performed to evaluate left ventricular wall motion, and estimate left ventricular ejection fraction. COMPARISON:  None. FINDINGS: Perfusion: There is a large fixed defect involving the inferolateral wall compatible with an infarct. There is no evidence to suggest reversibility or ischemia. Left ventricle is dilated. Wall Motion: There is markedly abnormal wall motion throughout the left ventricle. Severe hypokinesia throughout the left ventricle and difficult to exclude areas of paradoxical motion due to the minimal wall motion. Left Ventricular Ejection Fraction: 7 % End diastolic volume 496 ml End systolic volume 759 ml IMPRESSION: 1. Large infarct involving the inferolateral wall. No evidence for ischemia or reversibility. 2. Markedly abnormal wall motion throughout the left ventricle. Severe hypokinesia throughout the left ventricle. 3. Left ventricular ejection fraction is 7%. 4. Non invasive risk stratification*: High *2012 Appropriate Use Criteria for Coronary Revascularization Focused Update: J Am Coll Cardiol. 1638;46(6):599-357. http://[email or web address] Electronically Signed   By: Markus Daft M.D.   On: 03/29/2016 13:52    PHYSICAL EXAM General: NAD Neck: JVP 14 cm, no thyromegaly or thyroid nodule.  Lungs: Clear to auscultation bilaterally with normal respiratory effort. CV: Lateral PMI.  Heart irregular S1/S2, no S3/S4, no murmur.  No peripheral edema.  Abdomen: Soft, nontender, no hepatosplenomegaly, mild distention.  Neurologic: Alert and oriented x 3.  Psych: Normal affect. Extremities: No clubbing or cyanosis.   TELEMETRY: Reviewed telemetry pt in atrial flutter, rate in 70s  ASSESSMENT AND PLAN: 46 yo with long history of nonischemic cardiomyopathy, HTN, DM, and CKD stage III presented with exertional dyspnea/CHF exacerbation and was noted to have had ICD discharge on 1/14 for polymorphic VT.  1. Atrial flutter  with mild RVR: Newly noted, was in atrial flutter at admission.  Given severe cardiomyopathy, he will not tolerate this well. This may have triggered his current CHF exacerbation.  - Continue amiodarone, begun at admission for VT.  - Continue Coreg. - Continue apixaban.   - Plan for atrial flutter ablation on Friday with TEE prior, Dr Lovena Le to see.  2. Acute on chronic systolic CHF: EF 01-77% on echo with moderate to severely decreased RV systolic function.  Possibly hypertensive cardiomyopathy.  Had cath around time of diagnosis in 2006 without significant coronary disease.  Cardiolite 1/17 showed EF 7% with inferolateral fixed defect, no ischemia (possible artifact).  He remains volume overloaded on exam, BP is stable. He diuresed well yesterday on Lasix + metolazone, still pending labs. - Lasix 80 mg IV bid +  dose of metolazone this morning.  Replace K when BMET is back.   - Continue hydralazine + Imdur.  - Continue Coreg and lisinopril. Would consider transition to [facility] down the road. - Continue spironolactone 25.   3. CKD: Stage III.  Creatinine has been stable but pending BMET today.  Baseline elevation likely related to HTN and DM.  Watch carefully with diuresis.  4. DM: Poor control => hgbA1c 13.6.  He does not know who manages this at home (?no PCP).  Has just been taking metformin.  On insulin in hospital, adjusting with help of diabetes nurse coordinator.  Need care management assistance for home health and to see if we can find him a PCP.   5. VT: Polymorphic VT on 1/14 with ICD discharge, VT on 01/11/16 with ICD discharge.  Suspect related to CHF exacerbation rather than ischemia.  Amiodarone started.   6. Patient seems to have very poor understanding of his health issues.  Aside from his cardiac problems, he has uncontrolled diabetes and does not know who is supposed to be managing this as an outpatient.  May benefit from paramedicine and will see if we can get home health and  assistance with finding a PCP.   Loralie Champagne 03/31/2016 7:55 AM   Creatinine came back 2.1 this morning though he is still volume overloaded.  Will give IV Lasix but hold off on metolazone.  Stop lisinopril for now and increase hydralazine to 75 mg tid and Imdur to 60 daily.   Loralie Champagne 03/31/2016 8:44 AM

## 2016-03-31 NOTE — Progress Notes (Signed)
Site area: Rt ij Site Prior to Removal:  Level 0 Pressure Applied For: 10 min Manual:   yes Patient Status During Pull:  A/O Post Pull Site:  Level 0 Post Pull Instructions Given:  Yes pt understands Post Pull Pulses Present:  Dressing Applied:  tegaderm and a 2x2  Comments:additional sheaths to be removed

## 2016-04-01 ENCOUNTER — Inpatient Hospital Stay (HOSPITAL_COMMUNITY): Payer: BLUE CROSS/BLUE SHIELD

## 2016-04-01 ENCOUNTER — Encounter (HOSPITAL_COMMUNITY): Payer: Self-pay | Admitting: Cardiology

## 2016-04-01 LAB — GLUCOSE, CAPILLARY
GLUCOSE-CAPILLARY: 226 mg/dL — AB (ref 65–99)
GLUCOSE-CAPILLARY: 163 mg/dL — AB (ref 65–99)
Glucose-Capillary: 165 mg/dL — ABNORMAL HIGH (ref 65–99)
Glucose-Capillary: 189 mg/dL — ABNORMAL HIGH (ref 65–99)

## 2016-04-01 LAB — CBC
HCT: 44.8 % (ref 39.0–52.0)
Hemoglobin: 14.4 g/dL (ref 13.0–17.0)
MCH: 29.8 pg (ref 26.0–34.0)
MCHC: 32.1 g/dL (ref 30.0–36.0)
MCV: 92.6 fL (ref 78.0–100.0)
Platelets: 283 10*3/uL (ref 150–400)
RBC: 4.84 MIL/uL (ref 4.22–5.81)
RDW: 13.9 % (ref 11.5–15.5)
WBC: 5.3 10*3/uL (ref 4.0–10.5)

## 2016-04-01 LAB — BASIC METABOLIC PANEL
Anion gap: 10 (ref 5–15)
BUN: 35 mg/dL — AB (ref 6–20)
CALCIUM: 9.3 mg/dL (ref 8.9–10.3)
CO2: 30 mmol/L (ref 22–32)
CREATININE: 2.73 mg/dL — AB (ref 0.61–1.24)
Chloride: 95 mmol/L — ABNORMAL LOW (ref 101–111)
GFR, EST AFRICAN AMERICAN: 31 mL/min — AB (ref >60)
GFR, EST NON AFRICAN AMERICAN: 26 mL/min — AB (ref >60)
Glucose, Bld: 226 mg/dL — ABNORMAL HIGH (ref 65–99)
Potassium: 4.5 mmol/L (ref 3.5–5.1)
SODIUM: 135 mmol/L (ref 135–145)

## 2016-04-01 MED ORDER — DOBUTAMINE IN D5W 4-5 MG/ML-% IV SOLN
2.5000 ug/kg/min | INTRAVENOUS | Status: DC
  Administered 2016-04-02: 2.5 ug/kg/min via INTRAVENOUS
  Administered 2016-04-03 – 2016-04-04 (×2): 5 ug/kg/min via INTRAVENOUS
  Filled 2016-04-01 (×3): qty 250

## 2016-04-01 MED ORDER — AMIODARONE HCL 200 MG PO TABS
200.0000 mg | ORAL_TABLET | Freq: Two times a day (BID) | ORAL | Status: DC
  Administered 2016-04-01: 200 mg via ORAL
  Filled 2016-04-01 (×3): qty 1

## 2016-04-01 MED ORDER — OXYCODONE-ACETAMINOPHEN 5-325 MG PO TABS
2.0000 | ORAL_TABLET | Freq: Four times a day (QID) | ORAL | Status: DC | PRN
  Administered 2016-04-01 – 2016-04-10 (×5): 2 via ORAL
  Filled 2016-04-01 (×5): qty 2

## 2016-04-01 MED ORDER — MILRINONE LACTATE IN DEXTROSE 20-5 MG/100ML-% IV SOLN
0.1250 ug/kg/min | INTRAVENOUS | Status: DC
  Administered 2016-04-01: 0.125 ug/kg/min via INTRAVENOUS
  Filled 2016-04-01: qty 100

## 2016-04-01 MED ORDER — SPIRONOLACTONE 25 MG PO TABS: 12.5000 mg | ORAL_TABLET | Freq: Every day | ORAL | Status: DC

## 2016-04-01 NOTE — Progress Notes (Addendum)
Patient on milrinone alert and oriented BP soft in 80-90, MD notified, Dr. Haroldine Laws said okay to keep infusing milrinone. At around 1600 pt. C/o pain on his L. big toe, the toe look swollen also c/o back and chest pain with movement, 12 lead ekg done because the patient unable to describe the pain. NP and cardiology fellow notified. Pain med given. BP checked again at 1630 75/47 manually, pt. Is lethargic c/o lightheaded but oriented. milrinone and lasix on hold per order. Card fellow notified,  MD came to see the pt. Will continue to monitor the patient.

## 2016-04-01 NOTE — Progress Notes (Signed)
Patient stable overnight. RIJ site stable no hematoma. Right groin site stable no hematoma or bleeding noted with movement. Patient vital signs stable. Patient continues to be in NSR after ablation. HR in the 80's.

## 2016-04-01 NOTE — Progress Notes (Signed)
Patient ID: Travis Bryan, male   DOB: 06/25/1970, 46 y.o.   MRN: [id number]   SUBJECTIVE:   Underwent TEE and AFL ablation 1/19  EF 15-20% with moderate to severe RV HK  Lisinopril stopped due to low BP and worsening creatinine.   Now in NSR in 60-70s. Says he is "straight". Denies SOB. Not very interactive. Frustrated he can't go home.   Cardiolite: EF 7%, inferolateral fixed defect no ischemia.   Scheduled Meds: . amiodarone  400 mg Oral BID  . apixaban  5 mg Oral BID  . carvedilol  25 mg Oral BID WC  . furosemide  80 mg Intravenous BID  . hydrALAZINE  75 mg Oral TID  . insulin aspart  0-20 Units Subcutaneous TID WC  . insulin aspart  6 Units Subcutaneous TID WC  . insulin glargine  25 Units Subcutaneous QHS  . isosorbide mononitrate  60 mg Oral Daily  . magnesium oxide  400 mg Oral Daily  . mouth rinse  15 mL Mouth Rinse BID  . potassium chloride  40 mEq Oral BID  . regadenoson  0.4 mg Intravenous Once  . sodium chloride flush  3 mL Intravenous Q12H  . spironolactone  25 mg Oral Daily   Continuous Infusions:  PRN Meds:.sodium chloride, acetaminophen, ondansetron (ZOFRAN) IV, sodium chloride flush    Vitals:   03/31/16 2100 03/31/16 2115 04/01/16 0649 04/01/16 0851  BP: 106/62 107/60 101/64 96/69  Pulse: 75 74 72 74  Resp:   20 18  Temp:   99.1 F (37.3 C) 98 F (36.7 C)  TempSrc:   Oral Oral  SpO2:   100% 100%  Weight:   102.3 kg (225 lb 8 oz)     Intake/Output Summary (Last 24 hours) at 04/01/16 1059 Last data filed at 04/01/16 0923  Gross per 24 hour  Intake              240 ml  Output             1575 ml  Net            -1335 ml    LABS: Basic Metabolic Panel:  Recent Labs  03/31/16 0712 04/01/16 0634  NA 136 135  K 3.5 4.5  CL 97* 95*  CO2 31 30  GLUCOSE 188* 226*  BUN 26* 35*  CREATININE 2.13* 2.73*  CALCIUM 9.4 9.3   Liver Function Tests: No results for input(s): AST, ALT, ALKPHOS, BILITOT, PROT, ALBUMIN in the last 72 hours. No  results for input(s): LIPASE, AMYLASE in the last 72 hours. CBC:  Recent Labs  04/01/16 0634  WBC 5.3  HGB 14.4  HCT 44.8  MCV 92.6  PLT 283   Cardiac Enzymes: No results for input(s): CKTOTAL, CKMB, CKMBINDEX, TROPONINI in the last 72 hours. BNP: Invalid input(s): POCBNP D-Dimer: No results for input(s): DDIMER in the last 72 hours. Hemoglobin A1C:  Recent Labs  03/30/16 0800  HGBA1C 13.4*   Fasting Lipid Panel: No results for input(s): CHOL, HDL, LDLCALC, TRIG, CHOLHDL, LDLDIRECT in the last 72 hours. Thyroid Function Tests: No results for input(s): TSH, T4TOTAL, T3FREE, THYROIDAB in the last 72 hours.  Invalid input(s): FREET3 Anemia Panel: No results for input(s): VITAMINB12, FOLATE, FERRITIN, TIBC, IRON, RETICCTPCT in the last 72 hours.  RADIOLOGY: Dg Chest 2 View  Result Date: 03/28/2016 CLINICAL DATA:  Shortness of breath EXAM: CHEST  2 VIEW COMPARISON:  04/25/2007 FINDINGS: Left-sided single lead pacing device with lead over right ventricle.  Moderate cardiomegaly without overt failure. No acute infiltrate, consolidation, or pleural effusion. No pneumothorax peer IMPRESSION: Cardiomegaly without overt failure.  No acute infiltrate Electronically Signed   By: Donavan Foil M.D.   On: 03/28/2016 01:55   Nm Myocar Multi W/spect W/wall Motion / Ef  Result Date: 03/29/2016 CLINICAL DATA:  History of cardiomyopathy and atrial flutter. Acute on chronic systolic CHF. EXAM: MYOCARDIAL IMAGING WITH SPECT (REST AND PHARMACOLOGIC-STRESS) GATED LEFT VENTRICULAR WALL MOTION STUDY LEFT VENTRICULAR EJECTION FRACTION TECHNIQUE: Standard myocardial SPECT imaging was performed after resting intravenous injection of 10 mCi Tc-78m tetrofosmin. Subsequently, intravenous infusion of Lexiscan was performed under the supervision of the Cardiology staff. At peak effect of the drug, 30 mCi Tc-39m tetrofosmin was injected intravenously and standard myocardial SPECT imaging was performed.  Quantitative gated imaging was also performed to evaluate left ventricular wall motion, and estimate left ventricular ejection fraction. COMPARISON:  None. FINDINGS: Perfusion: There is a large fixed defect involving the inferolateral wall compatible with an infarct. There is no evidence to suggest reversibility or ischemia. Left ventricle is dilated. Wall Motion: There is markedly abnormal wall motion throughout the left ventricle. Severe hypokinesia throughout the left ventricle and difficult to exclude areas of paradoxical motion due to the minimal wall motion. Left Ventricular Ejection Fraction: 7 % End diastolic volume 448 ml End systolic volume 185 ml IMPRESSION: 1. Large infarct involving the inferolateral wall. No evidence for ischemia or reversibility. 2. Markedly abnormal wall motion throughout the left ventricle. Severe hypokinesia throughout the left ventricle. 3. Left ventricular ejection fraction is 7%. 4. Non invasive risk stratification*: High *2012 Appropriate Use Criteria for Coronary Revascularization Focused Update: J Am Coll Cardiol. 6314;97(0):263-785. http://[email or web address] Electronically Signed   By: Markus Daft M.D.   On: 03/29/2016 13:52    PHYSICAL EXAM General: NAD Neck: JVP jaw no thyromegaly or thyroid nodule.  Lungs: Clear to auscultation bilaterally with normal respiratory effort. CV: Lateral PMI.  Heart irregular S1/S2, n+s3 no murmur.  1+  edema. cool Abdomen: Soft, nontender, no hepatosplenomegaly, mild distention.  Neurologic: Alert and oriented x 3.  Psych: Normal affect. Extremities: No clubbing or cyanosis.   TELEMETRY: Reviewed telemetry pt in NSR rate in 70s  ASSESSMENT AND PLAN: 46 yo with long history of nonischemic cardiomyopathy, HTN, DM, and CKD stage III presented with exertional dyspnea/CHF exacerbation and was noted to have had ICD discharge on 1/14 for polymorphic VT.  1. Atrial flutter with mild RVR: Newly noted,  was in atrial flutter at admission.   - Now s/p AFL ablation 1/19. In NSR - Continue amiodarone, begun at admission for VT.  - Continue Coreg. - Continue apixaban.   2. Acute on chronic systolic CHF: EF 88-50% on echo with moderate to severely decreased RV systolic function.  Possibly hypertensive cardiomyopathy.  Had cath around time of diagnosis in 2006 without significant coronary disease.  Cardiolite 1/17 showed EF 7% with inferolateral fixed defect, no ischemia (possible artifact).  He remains volume overloaded on exam, BP is stable.  --TEE with severe biventricular dysfunction --Volume status remains elevated. Prominent s3 on exam. Renal function worse. Suspect low output. --He is hesitant about PICC. Will start empiric milrinone through PIV and gauge response.  --Continue lasix 80 IV bid --Stop carvedilol. Continue to hold lisinopril  --Watch for recurrent VT on milrinone 3. Acute on CKD: Stage IV.   --Creatinine worse. Likely low output cardio-renal. Hold lisinopril and carvedilol. Start milrinone. May need RHC/PICC. 4. DM: Poor control => hgbA1c 13.6.  He does not know who manages this at home (?no PCP).  Has just been taking metformin.  On insulin in hospital, adjusting with help of diabetes nurse coordinator.  Need care management assistance for home health and to see if we can find him a PCP.   5. VT: Polymorphic VT on 1/14 with ICD discharge, VT on 01/11/16 with ICD discharge.  Suspect related to CHF exacerbation rather than ischemia.  Amiodarone started --Keep K> 4 and mag > 2.   6. Patient seems to have very poor understanding of his health issues.  Aside from his cardiac problems, he has uncontrolled diabetes and does not know who is supposed to be managing this as an outpatient.  May benefit from paramedicine and will see if we can get home health and assistance with finding a PCP.   Glori Bickers MD 04/01/2016 10:59 AM

## 2016-04-02 ENCOUNTER — Inpatient Hospital Stay (HOSPITAL_COMMUNITY): Payer: BLUE CROSS/BLUE SHIELD

## 2016-04-02 DIAGNOSIS — R570 Cardiogenic shock: Secondary | ICD-10-CM

## 2016-04-02 DIAGNOSIS — I509 Heart failure, unspecified: Secondary | ICD-10-CM

## 2016-04-02 LAB — GLUCOSE, CAPILLARY
GLUCOSE-CAPILLARY: 227 mg/dL — AB (ref 65–99)
GLUCOSE-CAPILLARY: 115 mg/dL — AB (ref 65–99)
Glucose-Capillary: 202 mg/dL — ABNORMAL HIGH (ref 65–99)

## 2016-04-02 LAB — ECHOCARDIOGRAM LIMITED
Height: 70 in
Weight: 3668.45 oz

## 2016-04-02 LAB — BASIC METABOLIC PANEL
Anion gap: 12 (ref 5–15)
BUN: 46 mg/dL — AB (ref 6–20)
CALCIUM: 8.9 mg/dL (ref 8.9–10.3)
CO2: 25 mmol/L (ref 22–32)
CREATININE: 4.48 mg/dL — AB (ref 0.61–1.24)
Chloride: 95 mmol/L — ABNORMAL LOW (ref 101–111)
GFR calc Af Amer: 17 mL/min — ABNORMAL LOW (ref >60)
GFR, EST NON AFRICAN AMERICAN: 15 mL/min — AB (ref >60)
GLUCOSE: 182 mg/dL — AB (ref 65–99)
Potassium: 4.3 mmol/L (ref 3.5–5.1)
Sodium: 132 mmol/L — ABNORMAL LOW (ref 135–145)

## 2016-04-02 LAB — COOXEMETRY PANEL
CARBOXYHEMOGLOBIN: 2.1 % — AB (ref 0.5–1.5)
METHEMOGLOBIN: 1 % (ref 0.0–1.5)
O2 Saturation: 59.4 %
Total hemoglobin: 14.3 g/dL (ref 12.0–16.0)

## 2016-04-02 LAB — MAGNESIUM: Magnesium: 1.8 mg/dL (ref 1.7–2.4)

## 2016-04-02 LAB — MRSA PCR SCREENING: MRSA by PCR: NEGATIVE

## 2016-04-02 MED ORDER — MAGNESIUM SULFATE 2 GM/50ML IV SOLN
2.0000 g | Freq: Once | INTRAVENOUS | Status: AC
  Administered 2016-04-02: 2 g via INTRAVENOUS
  Filled 2016-04-02: qty 50

## 2016-04-02 MED ORDER — GI COCKTAIL ~~LOC~~
30.0000 mL | Freq: Once | ORAL | Status: AC
  Administered 2016-04-02: 30 mL via ORAL
  Filled 2016-04-02: qty 30

## 2016-04-02 MED ORDER — NOREPINEPHRINE BITARTRATE 1 MG/ML IV SOLN
0.0000 ug/min | INTRAVENOUS | Status: DC
  Administered 2016-04-02: 2 ug/min via INTRAVENOUS
  Administered 2016-04-03 (×2): 8 ug/min via INTRAVENOUS
  Filled 2016-04-02 (×2): qty 16

## 2016-04-02 MED ORDER — SODIUM CHLORIDE 0.9% FLUSH
10.0000 mL | INTRAVENOUS | Status: DC | PRN
Start: 2016-04-02 — End: 2016-04-14
  Administered 2016-04-08: 40 mL
  Administered 2016-04-08: 20 mL
  Filled 2016-04-02 (×2): qty 40

## 2016-04-02 MED ORDER — AMIODARONE HCL IN DEXTROSE 360-4.14 MG/200ML-% IV SOLN
60.0000 mg/h | INTRAVENOUS | Status: AC
  Filled 2016-04-02: qty 200

## 2016-04-02 MED ORDER — ALBUMIN HUMAN 5 % IV SOLN
25.0000 g | Freq: Once | INTRAVENOUS | Status: AC
  Administered 2016-04-02: 25 g via INTRAVENOUS
  Filled 2016-04-02: qty 250
  Filled 2016-04-02: qty 500

## 2016-04-02 MED ORDER — AMIODARONE HCL IN DEXTROSE 360-4.14 MG/200ML-% IV SOLN
30.0000 mg/h | INTRAVENOUS | Status: DC
  Administered 2016-04-03 – 2016-04-08 (×13): 30 mg/h via INTRAVENOUS
  Filled 2016-04-02 (×12): qty 200

## 2016-04-02 MED ORDER — SODIUM CHLORIDE 0.9 % IV SOLN: 250.0000 mL | INTRAVENOUS | Status: DC

## 2016-04-02 MED ORDER — SODIUM CHLORIDE 0.9% FLUSH: 3.0000 mL | INTRAVENOUS | Status: DC | PRN

## 2016-04-02 MED ORDER — HEPARIN (PORCINE) IN NACL 100-0.45 UNIT/ML-% IJ SOLN
1400.0000 [IU]/h | INTRAMUSCULAR | Status: DC
  Administered 2016-04-02: 1400 [IU]/h via INTRAVENOUS
  Administered 2016-04-03 – 2016-04-04 (×2): 1650 [IU]/h via INTRAVENOUS
  Administered 2016-04-05 – 2016-04-12 (×12): 1500 [IU]/h via INTRAVENOUS
  Administered 2016-04-13: 1400 [IU]/h via INTRAVENOUS
  Filled 2016-04-02 (×17): qty 250

## 2016-04-02 MED ORDER — AMIODARONE LOAD VIA INFUSION
150.0000 mg | Freq: Once | INTRAVENOUS | Status: AC
  Administered 2016-04-02: 150 mg via INTRAVENOUS
  Filled 2016-04-02: qty 83.34

## 2016-04-02 MED ORDER — AMIODARONE HCL IN DEXTROSE 360-4.14 MG/200ML-% IV SOLN
INTRAVENOUS | Status: AC
  Filled 2016-04-02: qty 200

## 2016-04-02 MED ORDER — ALBUMIN HUMAN 5 % IV SOLN
INTRAVENOUS | Status: AC
  Filled 2016-04-02: qty 250

## 2016-04-02 MED ORDER — FUROSEMIDE 10 MG/ML IJ SOLN
80.0000 mg | Freq: Once | INTRAMUSCULAR | Status: AC
  Administered 2016-04-02: 80 mg via INTRAVENOUS

## 2016-04-02 MED ORDER — SODIUM CHLORIDE 0.9% FLUSH
3.0000 mL | Freq: Two times a day (BID) | INTRAVENOUS | Status: DC
  Administered 2016-04-03: 3 mL via INTRAVENOUS

## 2016-04-02 MED ORDER — FUROSEMIDE 10 MG/ML IJ SOLN
160.0000 mg | Freq: Three times a day (TID) | INTRAVENOUS | Status: DC
  Administered 2016-04-02 – 2016-04-03 (×3): 160 mg via INTRAVENOUS
  Filled 2016-04-02 (×4): qty 16

## 2016-04-02 NOTE — Progress Notes (Signed)
Patient transferred to room 4N02. A/O X 4. No acute distress. Connected to monitor, CHF bath given, MRSA swab performed.

## 2016-04-02 NOTE — Progress Notes (Signed)
Re-check of 0430 BP of 74/45. Pt now lying on back and BP WNL

## 2016-04-02 NOTE — Progress Notes (Signed)
ANTICOAGULATION CONSULT NOTE - Initial Consult  Pharmacy Consult for heparin Indication: atrial fibrillation  No Known Allergies  Patient Measurements: Height: 5\' 10"  (177.8 cm) Weight: 229 lb 4.5 oz (104 kg) IBW/kg (Calculated) : 73 Heparin Dosing Weight: 96kg  Vital Signs: Temp: 98.9 F (37.2 C) (01/21 1100) Temp Source: Oral (01/21 1100) BP: 87/69 (01/21 1100) Pulse Rate: 81 (01/21 1100)  Labs:  Recent Labs  03/31/16 0712 04/01/16 0634 04/02/16 0234  HGB  --  14.4  --   HCT  --  44.8  --   PLT  --  283  --   CREATININE 2.13* 2.73* 4.48*    Estimated Creatinine Clearance: 25.2 mL/min (by C-G formula based on SCr of 4.48 mg/dL (H)).   Medical History: Past Medical History:  Diagnosis Date  . AICD (automatic cardioverter/defibrillator) present   . Chronic systolic CHF (congestive heart failure) ([place])   . History of gout   . Hyperlipidemia   . Hypertension   . Myocardial infarction    "I think I had one a long long time ago" (03/28/2016)  . NICM (nonischemic cardiomyopathy) ([place])    a. LHC 6/06: pLAD 20, pLCx 20-30; b. Echo 5/15:  EF 15%, diffuse HK, restrictive physiology, trivial AI, trivial MR, mild LAE, moderate RVE, moderately reduced RVSF, moderate RAE, mild to moderate TR, PASP 43 mmHg  . Obesity   . Type II diabetes mellitus [facility])    Assessment: 46 year old male s/p TEE/AFL ablation on 1/19. Patient was placed on apixaban prior to ablation. Patient now with low output heart failure and acute renal failure. Given possible need for procedures will change anticoagulation to heparin for now. Apixaban given this morning so will plan on starting heparin tonight. CBC normal.   Goal of Therapy:  Heparin level 0.3-0.7 units/ml Monitor platelets by anticoagulation protocol: Yes   Plan:  Start heparin tonight at 1400 units/hr Check 6 hour HL/CBC in am then daily  Erin Hearing PharmD., BCPS Clinical Pharmacist Pager [phone] 04/02/2016 11:50 AM

## 2016-04-02 NOTE — Progress Notes (Signed)
Peripherally Inserted Central Catheter/Midline Placement  The IV Nurse has discussed with the patient and/or persons authorized to consent for the patient, the purpose of this procedure and the potential benefits and risks involved with this procedure.  The benefits include less needle sticks, lab draws from the catheter, and the patient may be discharged home with the catheter. Risks include, but not limited to, infection, bleeding, blood clot (thrombus formation), and puncture of an artery; nerve damage and irregular heartbeat and possibility to perform a PICC exchange if needed/ordered by physician.  Alternatives to this procedure were also discussed.  Bard Power PICC patient education guide, fact sheet on infection prevention and patient information card has been provided to patient /or left at bedside.    PICC/Midline Placement Documentation  PICC Triple Lumen 04/02/16 PICC Right Brachial 41 cm 0 cm (Active)  Indication for Insertion or Continuance of Line Vasoactive infusions;Prolonged intravenous therapies 04/02/2016  9:14 AM  Exposed Catheter (cm) 0 cm 04/02/2016  9:14 AM  Site Assessment Clean;Dry;Intact 04/02/2016  9:14 AM  Lumen #1 Status Flushed;Saline locked;Blood return noted 04/02/2016  9:14 AM  Lumen #2 Status Flushed;Saline locked;Blood return noted 04/02/2016  9:14 AM  Lumen #3 Status Flushed;Saline locked;Blood return noted 04/02/2016  9:14 AM  Dressing Type Transparent 04/02/2016  9:14 AM  Dressing Status Clean;Dry;Intact;Antimicrobial disc in place 04/02/2016  9:14 AM  Line Care Connections checked and tightened 04/02/2016  9:14 AM  Line Adjustment (NICU/IV Team Only) No 04/02/2016  9:14 AM  Dressing Intervention New dressing 04/02/2016  9:14 AM  Dressing Change Due 04/09/16 04/02/2016  9:14 AM       Rolena Infante 04/02/2016, 9:15 AM

## 2016-04-02 NOTE — Progress Notes (Signed)
  Echocardiogram 2D Echocardiogram Limited has been performed.  Travis Bryan M 04/02/2016, 11:54 AM

## 2016-04-02 NOTE — Progress Notes (Signed)
  Amiodarone Drug - Drug Interaction Consult Note  Recommendations: Monitor K, QTc  Amiodarone is metabolized by the cytochrome P450 system and therefore has the potential to cause many drug interactions. Amiodarone has an average plasma half-life of 50 days (range 20 to 100 days).   There is potential for drug interactions to occur several weeks or months after stopping treatment and the onset of drug interactions may be slow after initiating amiodarone.   []  Statins: Increased risk of myopathy. Simvastatin- restrict dose to 20mg  daily. Other statins: counsel patients to report any muscle pain or weakness immediately.  []  Anticoagulants: Amiodarone can increase anticoagulant effect. Consider warfarin dose reduction. Patients should be monitored closely and the dose of anticoagulant altered accordingly, remembering that amiodarone levels take several weeks to stabilize.  []  Antiepileptics: Amiodarone can increase plasma concentration of phenytoin, the dose should be reduced. Note that small changes in phenytoin dose can result in large changes in levels. Monitor patient and counsel on signs of toxicity.  []  Beta blockers: increased risk of bradycardia, AV block and myocardial depression. Sotalol - avoid concomitant use.  []   Calcium channel blockers (diltiazem and verapamil): increased risk of bradycardia, AV block and myocardial depression.  []   Cyclosporine: Amiodarone increases levels of cyclosporine. Reduced dose of cyclosporine is recommended.  []  Digoxin dose should be halved when amiodarone is started.  [x]  Diuretics: increased risk of cardiotoxicity if hypokalemia occurs.  []  Oral hypoglycemic agents (glyburide, glipizide, glimepiride): increased risk of hypoglycemia. Patient's glucose levels should be monitored closely when initiating amiodarone therapy.   [x]  Drugs that prolong the QT interval:  Torsades de pointes risk may be increased with concurrent use - avoid if possible.   Monitor QTc, also keep magnesium/potassium WNL if concurrent therapy can't be avoided. Marland Kitchen Antibiotics: e.g. fluoroquinolones, erythromycin. . Antiarrhythmics: e.g. quinidine, procainamide, disopyramide, sotalol. . Antipsychotics: e.g. phenothiazines, haloperidol.  . Lithium, tricyclic antidepressants, and methadone. Thank You,  Narda Bonds  04/02/2016 10:45 PM

## 2016-04-02 NOTE — Progress Notes (Signed)
Pt lying on left side, will reassess

## 2016-04-02 NOTE — Progress Notes (Signed)
Patient continues to c/o pain in chest area but has moved from mid epigastric area to over the heart and radiating to left shoulder. Patient given Tylenol. Notified Dr. Kenton Kingfisher (cardiologist on call) who recommended trying a GI cocktail. Will administer as ordered and continue to monitor.

## 2016-04-02 NOTE — Consult Note (Addendum)
[facility] Consult Note     Date: 04/02/2016                  Patient Name:  Travis Bryan  MRN: [id number]  DOB: 1970-04-02  Age / Sex: 46 y.o., male         PCP: No PCP Per Patient                 Service Requesting Consult: Dr. Haroldine Laws Heart Failure                 Reason for Consult: Acute kidney injury            Chief Complaint: "fluid in the chest"  HPI: Pt is a 7M with NICM with EF 10-15% and moderately reduced RV function, HTN, HLD, and CKD Stage III who is now seen in consultation at the request of Dr. Haroldine Laws for evaluation and recommendations surrounding acute on chronic kidney injury.  Briefly, pt presented to ED for exertional dyspnea/CHF exacerbation and was noted to have an AICD shock for polymorphic VT .  Once admitted, he was found to have aflutter with mild RVR and underwent TEE and ablation 1/19.    He has decompensated and is now markedly volume overload.  He has become oliguric and creatinine has gone from 1.7--> 4.5.  Was briefly trialed on milrinone but was stopped due to hypotension.  Now on dobutamine and high dose Lasix 160 IV BID.  Reports nausea.  Past Medical History:  Diagnosis Date  . AICD (automatic cardioverter/defibrillator) present   . Chronic systolic CHF (congestive heart failure) ([place])   . History of gout   . Hyperlipidemia   . Hypertension   . Myocardial infarction    "I think I had one a long long time ago" (03/28/2016)  . NICM (nonischemic cardiomyopathy) ([place])    a. LHC 6/06: pLAD 20, pLCx 20-30; b. Echo 5/15:  EF 15%, diffuse HK, restrictive physiology, trivial AI, trivial MR, mild LAE, moderate RVE, moderately reduced RVSF, moderate RAE, mild to moderate TR, PASP 43 mmHg  . Obesity   . Type II diabetes mellitus ([place])     Past Surgical History:  Procedure Laterality Date  . IMPLANTABLE CARDIOVERTER DEFIBRILLATOR (ICD) GENERATOR CHANGE N/A 08/27/2013   Procedure: ICD GENERATOR CHANGE;  Surgeon: Evans Lance, MD;  Location: [facility] CATH LAB;  Service: Cardiovascular;  Laterality: N/A;  . TEE WITHOUT CARDIOVERSION N/A 03/31/2016   Procedure: TRANSESOPHAGEAL ECHOCARDIOGRAM (TEE);  Surgeon: Larey Dresser, MD;  Location: [facility] ENDOSCOPY;  Service: Cardiovascular;  Laterality: N/A;    Family History  Problem Relation Age of Onset  . Hypertension Mother   . Heart disease Mother   . Diabetes Mother    Social History:  reports that he quit smoking about 13 years ago. His smoking use included Cigars. He quit after 10.00 years of use. He has never used smokeless tobacco. He reports that he drinks alcohol. He reports that he does not use drugs.  Allergies: No Known Allergies  Medications Prior to Admission  Medication Sig Dispense Refill  . carvedilol (COREG) 25 MG tablet TAKE ONE TABLET BY MOUTH TWICE DAILY WITH MEALS (Patient taking differently: Take 25 mg by mouth two times a day) 60 tablet 7  . furosemide (LASIX) 80 MG tablet TAKE ONE TABLET BY MOUTH TWICE DAILY (Patient taking differently: Take 80 mg by mouth two times a day) 180 tablet 3  . hydrALAZINE (APRESOLINE) 50 MG tablet TAKE ONE TABLET  BY MOUTH THREE TIMES DAILY 270 tablet 1  . lisinopril (PRINIVIL,ZESTRIL) 40 MG tablet Take 1 tablet (40 mg total) by mouth daily. 90 tablet 3  . metFORMIN (GLUCOPHAGE) 1000 MG tablet Take 1 tablet (1,000 mg total) by mouth 2 (two) times daily with a meal. 60 tablet 6  . spironolactone (ALDACTONE) 25 MG tablet Take 1 tablet (25 mg total) by mouth daily. 30 tablet 11  . HYDROcodone-acetaminophen (NORCO) 5-325 MG tablet Take 1 tablet by mouth every 4 (four) hours as needed for severe pain. (Patient not taking: Reported on 03/28/2016) 15 tablet 0  . potassium chloride (K-DUR) 10 MEQ tablet Take 1 tablet (10 mEq total) by mouth daily. (Patient not taking: Reported on 03/28/2016) 30 tablet 11    Results for orders placed or performed during the hospital encounter of 03/28/16 (from the past 48 hour(s))  Glucose,  capillary     Status: Abnormal   Collection Time: 03/31/16  4:26 PM  Result Value Ref Range   Glucose-Capillary 140 (H) 65 - 99 mg/dL  Glucose, capillary     Status: Abnormal   Collection Time: 03/31/16  9:15 PM  Result Value Ref Range   Glucose-Capillary 230 (H) 65 - 99 mg/dL   Comment 1 Notify RN    Comment 2 Document in Chart   Basic metabolic panel     Status: Abnormal   Collection Time: 04/01/16  6:34 AM  Result Value Ref Range   Sodium 135 135 - 145 mmol/L   Potassium 4.5 3.5 - 5.1 mmol/L   Chloride 95 (L) 101 - 111 mmol/L   CO2 30 22 - 32 mmol/L   Glucose, Bld 226 (H) 65 - 99 mg/dL   BUN 35 (H) 6 - 20 mg/dL   Creatinine, Ser 2.73 (H) 0.61 - 1.24 mg/dL   Calcium 9.3 8.9 - 10.3 mg/dL   GFR calc non Af Amer 26 (L) >60 mL/min   GFR calc Af Amer 31 (L) >60 mL/min    Comment: (NOTE) The eGFR has been calculated using the CKD EPI equation. This calculation has not been validated in all clinical situations. eGFR's persistently <60 mL/min signify possible Chronic Kidney Disease.    Anion gap 10 5 - 15  CBC     Status: None   Collection Time: 04/01/16  6:34 AM  Result Value Ref Range   WBC 5.3 4.0 - 10.5 K/uL   RBC 4.84 4.22 - 5.81 MIL/uL   Hemoglobin 14.4 13.0 - 17.0 g/dL   HCT 44.8 39.0 - 52.0 %   MCV 92.6 78.0 - 100.0 fL   MCH 29.8 26.0 - 34.0 pg   MCHC 32.1 30.0 - 36.0 g/dL   RDW 13.9 11.5 - 15.5 %   Platelets 283 150 - 400 K/uL  Glucose, capillary     Status: Abnormal   Collection Time: 04/01/16  6:43 AM  Result Value Ref Range   Glucose-Capillary 226 (H) 65 - 99 mg/dL   Comment 1 Notify RN    Comment 2 Document in Chart   Glucose, capillary     Status: Abnormal   Collection Time: 04/01/16 11:43 AM  Result Value Ref Range   Glucose-Capillary 163 (H) 65 - 99 mg/dL   Comment 1 Notify RN   Glucose, capillary     Status: Abnormal   Collection Time: 04/01/16  4:37 PM  Result Value Ref Range   Glucose-Capillary 165 (H) 65 - 99 mg/dL   Comment 1 Notify RN    Glucose, capillary  Status: Abnormal   Collection Time: 04/01/16  9:34 PM  Result Value Ref Range   Glucose-Capillary 189 (H) 65 - 99 mg/dL   Comment 1 Notify RN    Comment 2 Document in Chart   MRSA PCR Screening     Status: None   Collection Time: 04/02/16  2:31 AM  Result Value Ref Range   MRSA by PCR NEGATIVE NEGATIVE    Comment:        The GeneXpert MRSA Assay (FDA approved for NASAL specimens only), is one component of a comprehensive MRSA colonization surveillance program. It is not intended to diagnose MRSA infection nor to guide or monitor treatment for MRSA infections.   Basic metabolic panel     Status: Abnormal   Collection Time: 04/02/16  2:34 AM  Result Value Ref Range   Sodium 132 (L) 135 - 145 mmol/L   Potassium 4.3 3.5 - 5.1 mmol/L   Chloride 95 (L) 101 - 111 mmol/L   CO2 25 22 - 32 mmol/L   Glucose, Bld 182 (H) 65 - 99 mg/dL   BUN 46 (H) 6 - 20 mg/dL   Creatinine, Ser 4.48 (H) 0.61 - 1.24 mg/dL    Comment: DELTA CHECK NOTED   Calcium 8.9 8.9 - 10.3 mg/dL   GFR calc non Af Amer 15 (L) >60 mL/min   GFR calc Af Amer 17 (L) >60 mL/min    Comment: (NOTE) The eGFR has been calculated using the CKD EPI equation. This calculation has not been validated in all clinical situations. eGFR's persistently <60 mL/min signify possible Chronic Kidney Disease.    Anion gap 12 5 - 15  Magnesium     Status: None   Collection Time: 04/02/16  2:34 AM  Result Value Ref Range   Magnesium 1.8 1.7 - 2.4 mg/dL  Glucose, capillary     Status: Abnormal   Collection Time: 04/02/16  7:21 AM  Result Value Ref Range   Glucose-Capillary 202 (H) 65 - 99 mg/dL  .Cooxemetry Panel (carboxy, met, total hgb, O2 sat)     Status: Abnormal   Collection Time: 04/02/16  8:07 AM  Result Value Ref Range   Total hemoglobin 14.3 12.0 - 16.0 g/dL   O2 Saturation 59.4 %   Carboxyhemoglobin 2.1 (H) 0.5 - 1.5 %   Methemoglobin 1.0 0.0 - 1.5 %  Glucose, capillary     Status: Abnormal    Collection Time: 04/02/16 11:30 AM  Result Value Ref Range   Glucose-Capillary 227 (H) 65 - 99 mg/dL   Dg Chest Port 1 View  Result Date: 04/01/2016 CLINICAL DATA:  Acute on chronic systolic congestive heart failure. Nonischemic cardiomyopathy. EXAM: PORTABLE CHEST 1 VIEW COMPARISON:  03/28/2016 FINDINGS: Stable mild cardiomegaly. AICD remains in stable position with tip overlying the left ventricle. No pneumothorax visualized. Mild opacity in the left costophrenic angle may be due to atelectasis or small pleural effusion. Lung fields are otherwise clear. IMPRESSION: New opacity in left costophrenic angle, which may be due to atelectasis or pleural effusion. Electronically Signed   By: Earle Gell M.D.   On: 04/01/2016 20:57    ROS: all other systems reviewed and are negative except as per HPI  Blood pressure (!) 114/102, pulse (!) 57, temperature 98.9 F (37.2 C), temperature source Oral, resp. rate (!) 27, height '5\' 10"'  (1.778 m), weight 104 kg (229 lb 4.5 oz), SpO2 95 %. Physical Exam  GEN ill-appearing, sleeping HEENT EOMI, PERRL, MMM poor dentition NECK JVD to angle  of mandible PULM bibasilar crackles CV tachycardic, regular, +S3 ABD distended, +liver edge palpable 4 cm below costal margin EXT 1+ LE edema NEURO sleeping but arousable, nonfocal  Assessment/Plan  1.  Acute kidney injury superimposed on CKD Stage 3: due to decompensated CHF with severely reduced EF .  Intrope trial started and high-dose Lasix begun.  If no improvement in urine output, will likely begin CRRT.  Not a good outpatient dialysis candidate given severely reduced Ef.  2.  Acute systolic CHF exacerbation: inotropes (dobutamine) and norepi if needed and lasix as above.  3.  Afib/ flutter: s/p ablation 1/19, on Eliquis  4.  Tanna Furry- s/p AICD shock 03/26/16; amio started but d/c'd due to CHF  5.  DM II : Hgb A1c quite elevated 13.6   Madelon Lips MD [facility] 04/02/2016, 3:26 PM

## 2016-04-02 NOTE — Progress Notes (Addendum)
Patient ID: Travis Bryan, male   DOB: Aug 21, 1970, 46 y.o.   MRN: [id number]   SUBJECTIVE:   Underwent TEE and AFL ablation 1/19  EF 15-20% with moderate to severe RV HK  Lisinopril stopped due to low BP and worsening creatinine.   Yesterday was lethargic and volume overloaded. Started on milrinone but BP dropped and switched to dobutamine. Transferred to SDU.PICC placed. SBP 90s. CVP 15. Co-ox 59% on dobutamine 2.5. Creatinine up to 4.5. Feels weak but denies other complaints.    Bedside echo done showed EF 15-20. No periprocedural effusion.   Cardiolite: EF 7%, inferolateral fixed defect no ischemia.   Scheduled Meds: . amiodarone  200 mg Oral BID  . furosemide  80 mg Intravenous BID  . insulin aspart  0-20 Units Subcutaneous TID WC  . insulin aspart  6 Units Subcutaneous TID WC  . insulin glargine  25 Units Subcutaneous QHS  . magnesium oxide  400 mg Oral Daily  . magnesium sulfate 1 - 4 g bolus IVPB  2 g Intravenous Once  . sodium chloride flush  3 mL Intravenous Q12H   Continuous Infusions: . DOBUTamine 5 mcg/kg/min (04/02/16 1057)  . heparin     PRN Meds:.sodium chloride, acetaminophen, ondansetron (ZOFRAN) IV, oxyCODONE-acetaminophen, sodium chloride flush, sodium chloride flush    Vitals:   04/02/16 0900 04/02/16 0930 04/02/16 1000 04/02/16 1100  BP: 101/69 (!) 80/54 (!) 89/66 (!) 87/69  Pulse: 85 84 83 81  Resp: (!) 28 (!) 31 (!) 24 (!) 21  Temp:    98.9 F (37.2 C)  TempSrc:    Oral  SpO2: 95% 94% 96% 96%  Weight:      Height:        Intake/Output Summary (Last 24 hours) at 04/02/16 1210 Last data filed at 04/02/16 1000  Gross per 24 hour  Intake          1097.69 ml  Output              350 ml  Net           747.69 ml    LABS: Basic Metabolic Panel:  Recent Labs  04/01/16 0634 04/02/16 0234  NA 135 132*  K 4.5 4.3  CL 95* 95*  CO2 30 25  GLUCOSE 226* 182*  BUN 35* 46*  CREATININE 2.73* 4.48*  CALCIUM 9.3 8.9  MG  --  1.8   Liver  Function Tests: No results for input(s): AST, ALT, ALKPHOS, BILITOT, PROT, ALBUMIN in the last 72 hours. No results for input(s): LIPASE, AMYLASE in the last 72 hours. CBC:  Recent Labs  04/01/16 0634  WBC 5.3  HGB 14.4  HCT 44.8  MCV 92.6  PLT 283   Cardiac Enzymes: No results for input(s): CKTOTAL, CKMB, CKMBINDEX, TROPONINI in the last 72 hours. BNP: Invalid input(s): POCBNP D-Dimer: No results for input(s): DDIMER in the last 72 hours. Hemoglobin A1C: No results for input(s): HGBA1C in the last 72 hours. Fasting Lipid Panel: No results for input(s): CHOL, HDL, LDLCALC, TRIG, CHOLHDL, LDLDIRECT in the last 72 hours. Thyroid Function Tests: No results for input(s): TSH, T4TOTAL, T3FREE, THYROIDAB in the last 72 hours.  Invalid input(s): FREET3 Anemia Panel: No results for input(s): VITAMINB12, FOLATE, FERRITIN, TIBC, IRON, RETICCTPCT in the last 72 hours.  RADIOLOGY: Dg Chest 2 View  Result Date: 03/28/2016 CLINICAL DATA:  Shortness of breath EXAM: CHEST  2 VIEW COMPARISON:  04/25/2007 FINDINGS: Left-sided single lead pacing device with lead over right ventricle.  Moderate cardiomegaly without overt failure. No acute infiltrate, consolidation, or pleural effusion. No pneumothorax peer IMPRESSION: Cardiomegaly without overt failure.  No acute infiltrate Electronically Signed   By: Donavan Foil M.D.   On: 03/28/2016 01:55   Nm Myocar Multi W/spect W/wall Motion / Ef  Result Date: 03/29/2016 CLINICAL DATA:  History of cardiomyopathy and atrial flutter. Acute on chronic systolic CHF. EXAM: MYOCARDIAL IMAGING WITH SPECT (REST AND PHARMACOLOGIC-STRESS) GATED LEFT VENTRICULAR WALL MOTION STUDY LEFT VENTRICULAR EJECTION FRACTION TECHNIQUE: Standard myocardial SPECT imaging was performed after resting intravenous injection of 10 mCi Tc-64m tetrofosmin. Subsequently, intravenous infusion of Lexiscan was performed under the supervision of the Cardiology staff. At peak effect of the drug,  30 mCi Tc-29m tetrofosmin was injected intravenously and standard myocardial SPECT imaging was performed. Quantitative gated imaging was also performed to evaluate left ventricular wall motion, and estimate left ventricular ejection fraction. COMPARISON:  None. FINDINGS: Perfusion: There is a large fixed defect involving the inferolateral wall compatible with an infarct. There is no evidence to suggest reversibility or ischemia. Left ventricle is dilated. Wall Motion: There is markedly abnormal wall motion throughout the left ventricle. Severe hypokinesia throughout the left ventricle and difficult to exclude areas of paradoxical motion due to the minimal wall motion. Left Ventricular Ejection Fraction: 7 % End diastolic volume 638 ml End systolic volume 453 ml IMPRESSION: 1. Large infarct involving the inferolateral wall. No evidence for ischemia or reversibility. 2. Markedly abnormal wall motion throughout the left ventricle. Severe hypokinesia throughout the left ventricle. 3. Left ventricular ejection fraction is 7%. 4. Non invasive risk stratification*: High *2012 Appropriate Use Criteria for Coronary Revascularization Focused Update: J Am Coll Cardiol. 6468;03(2):122-482. http://content.airportbarriers.com.aspx?articleid=[email or web address] Electronically Signed   By: Markus Daft M.D.   On: 03/29/2016 13:52   Dg Chest Port 1 View  Result Date: 04/01/2016 CLINICAL DATA:  Acute on chronic systolic congestive heart failure. Nonischemic cardiomyopathy. EXAM: PORTABLE CHEST 1 VIEW COMPARISON:  03/28/2016 FINDINGS: Stable mild cardiomegaly. AICD remains in stable position with tip overlying the left ventricle. No pneumothorax visualized. Mild opacity in the left costophrenic angle may be due to atelectasis or small pleural effusion. Lung fields are otherwise clear. IMPRESSION: New opacity in left costophrenic angle, which may be due to atelectasis or pleural effusion. Electronically Signed   By: Earle Gell M.D.   On:  04/01/2016 20:57    PHYSICAL EXAM General: NAD. Fatigued HEENT: poor dentition Neck: JVP jaw no thyromegaly or thyroid nodule.  Lungs: Basilar crackles CV: Lateral PMI.  Heart irregular S1/S2, +prominent s3 no murmur.  1+  edema. cool Abdomen: Soft, nontender, no hepatosplenomegaly, ++distention.  Neurologic: Alert and oriented x 3.  Psych: Flat affect. Extremities: No clubbing or cyanosis.   TELEMETRY: Reviewed telemetry pt in NSR rate in 80s  ASSESSMENT AND PLAN: 46 yo with long history of nonischemic cardiomyopathy, HTN, DM, and CKD stage III presented with exertional dyspnea/CHF exacerbation and was noted to have had ICD discharge on 1/14 for polymorphic VT.  1. Atrial flutter with mild RVR: Newly noted, was in atrial flutter at admission.   - Now s/p AFL ablation 1/19. In NSR - Amiodarone stopped due to hypotension  - Will switch Eliquis to heparin with AKI and probable need for Trialysis catheter 2. Acute on chronic systolic CHF -> cardiogenic shock: EF 10-15% on echo with moderate to severely decreased RV systolic function.  Possibly hypertensive cardiomyopathy.  Had cath around time of diagnosis in 2006 without significant coronary disease.  Cardiolite 1/17  showed EF 7% with inferolateral fixed defect, no ischemia (possible artifact).  He remains volume overloaded on exam, BP is stable.  --TEE with severe biventricular dysfunction --He is in cardiogenic shock with marked volume overload. ACE and BB stopped --Will increase dobutamine to 5. Add norepi as needed to keep SBP > 90 --Give lasix 160 IV q8 --I have discussed with Renal and CCM. Will likely need Trialysis catheter for CVVHD if no response to inotropes and high-dose lasix 3. Acute on CKD: Stage IV.   --Creatinine worse. Likely low output cardio-renal -> ATN. See above 4. DM: Poor control => hgbA1c 13.6.  He does not know who manages this at home (?no PCP).  Has just been taking metformin.  On insulin in hospital,  adjusting with help of diabetes nurse coordinator.  Need care management assistance for home health and to see if we can find him a PCP.   5. VT: Polymorphic VT on 1/14 with ICD discharge, VT on 01/11/16 with ICD discharge.  Suspect related to CHF exacerbation rather than ischemia.  Amiodarone started but now off due to low output HF --Keep K> 4 and mag > 2.   6. Patient seems to have very poor understanding of his health issues due to cognitive difficulties. I did my best to explain to him how sick he is.   The patient is critically ill with multiple organ systems failure and requires high complexity decision making for assessment and support, frequent evaluation and titration of therapies, application of advanced monitoring technologies and extensive interpretation of multiple databases.   Critical Care Time devoted to patient care services described in this note is 35 Minutes.    Glori Bickers MD 04/02/2016 12:10 PM

## 2016-04-02 NOTE — Progress Notes (Signed)
Called due to patient going into afib initially with RVR but now HR controlled in the 90's.  MAP 60mmHg on Levo at 12.  Had alutter ablation a few days ago.  He has severe LV dysfunction with low output HF.  As BP is stable and HR controlled, will continue IV Dobutamine for HF.  Start Amio with 150mg  IV bolus and then gtt at 60mg /hr for 6 hours and then 30mg /hr.  Will make NPO after MN for DCCV in am by Dr. Haroldine Laws.

## 2016-04-03 ENCOUNTER — Encounter (HOSPITAL_COMMUNITY): Payer: Self-pay | Admitting: Internal Medicine

## 2016-04-03 DIAGNOSIS — I481 Persistent atrial fibrillation: Secondary | ICD-10-CM

## 2016-04-03 LAB — BASIC METABOLIC PANEL
Anion gap: 13 (ref 5–15)
BUN: 51 mg/dL — AB (ref 6–20)
CHLORIDE: 90 mmol/L — AB (ref 101–111)
CO2: 28 mmol/L (ref 22–32)
Calcium: 9.1 mg/dL (ref 8.9–10.3)
Creatinine, Ser: 4.34 mg/dL — ABNORMAL HIGH (ref 0.61–1.24)
GFR calc Af Amer: 18 mL/min — ABNORMAL LOW (ref >60)
GFR calc non Af Amer: 15 mL/min — ABNORMAL LOW (ref >60)
Glucose, Bld: 275 mg/dL — ABNORMAL HIGH (ref 65–99)
POTASSIUM: 4.4 mmol/L (ref 3.5–5.1)
SODIUM: 131 mmol/L — AB (ref 135–145)

## 2016-04-03 LAB — CBC
HEMATOCRIT: 47.1 % (ref 39.0–52.0)
Hemoglobin: 15.9 g/dL (ref 13.0–17.0)
MCH: 30.6 pg (ref 26.0–34.0)
MCHC: 33.8 g/dL (ref 30.0–36.0)
MCV: 90.8 fL (ref 78.0–100.0)
Platelets: 307 10*3/uL (ref 150–400)
RBC: 5.19 MIL/uL (ref 4.22–5.81)
RDW: 14.1 % (ref 11.5–15.5)
WBC: 9.6 10*3/uL (ref 4.0–10.5)

## 2016-04-03 LAB — APTT
APTT: 45 s — AB (ref 24–36)
APTT: 95 s — AB (ref 24–36)
aPTT: 80 seconds — ABNORMAL HIGH (ref 24–36)

## 2016-04-03 LAB — COOXEMETRY PANEL
CARBOXYHEMOGLOBIN: 1.8 % — AB (ref 0.5–1.5)
Carboxyhemoglobin: 1.5 % (ref 0.5–1.5)
METHEMOGLOBIN: 0.9 % (ref 0.0–1.5)
Methemoglobin: 0.9 % (ref 0.0–1.5)
O2 SAT: 60.6 %
O2 Saturation: 62.9 %
TOTAL HEMOGLOBIN: 14.9 g/dL (ref 12.0–16.0)
Total hemoglobin: 15.7 g/dL (ref 12.0–16.0)

## 2016-04-03 LAB — GLUCOSE, CAPILLARY
GLUCOSE-CAPILLARY: 180 mg/dL — AB (ref 65–99)
GLUCOSE-CAPILLARY: 253 mg/dL — AB (ref 65–99)
GLUCOSE-CAPILLARY: 260 mg/dL — AB (ref 65–99)
GLUCOSE-CAPILLARY: 179 mg/dL — AB (ref 65–99)
Glucose-Capillary: 152 mg/dL — ABNORMAL HIGH (ref 65–99)
Glucose-Capillary: 395 mg/dL — ABNORMAL HIGH (ref 65–99)

## 2016-04-03 LAB — HEPARIN LEVEL (UNFRACTIONATED): Heparin Unfractionated: 2.2 IU/mL — ABNORMAL HIGH (ref 0.30–0.70)

## 2016-04-03 MED ORDER — FUROSEMIDE 10 MG/ML IJ SOLN
160.0000 mg | Freq: Two times a day (BID) | INTRAVENOUS | Status: DC
  Administered 2016-04-03 – 2016-04-06 (×7): 160 mg via INTRAVENOUS
  Filled 2016-04-03 (×8): qty 16

## 2016-04-03 NOTE — Progress Notes (Signed)
[facility] Rounding Note  Subjective/Interval History:  Remains on norepi and dobutamine   Objective Vital signs in last 24 hours: Vitals:   04/03/16 1130 04/03/16 1159 04/03/16 1200 04/03/16 1230  BP: (!) 117/93  (!) 108/96 119/83  Pulse: (!) 35  (!) 59 (!) 43  Resp: 20  (!) 25 (!) 24  Temp:  98.5 F (36.9 C)    TempSrc:  Oral    SpO2: 97%  97% 97%  Weight:      Height:       Weight change: -0.1 kg (-3.5 oz)  Intake/Output Summary (Last 24 hours) at 04/03/16 1255 Last data filed at 04/03/16 1200  Gross per 24 hour  Intake          2090.02 ml  Output             3025 ml  Net          -934.98 ml   Physical Exam:  Blood pressure 119/83, pulse (!) 43, temperature 98.5 F (36.9 C), temperature source Oral, resp. rate (!) 24, height 5\' 10"  (1.778 m), weight 103.9 kg (229 lb 0.9 oz), SpO2 97 %.  General: NAD, lying in bed Hard to engage Poor dentition Neck: JVP 7-8  Lungs: Decreased in the bases. No crackles CV: Heart irregular S1/S2,  +prominent s3  No murmur.    Abdomen: Soft, nontender, , mild distention.  Neurologic: Alert and oriented x 3.  Psych: Flat affect. Extremities: No clubbing or cyanosis.  No LE edema  Labs:   Recent Labs Lab 03/29/16 0506 03/29/16 1611 03/30/16 0533 03/31/16 0712 04/01/16 0634 04/02/16 0234 04/03/16 0524  NA 135 138 136 136 135 132* 131*  K 3.2* 3.4* 3.7 3.5 4.5 4.3 4.4  CL 97* 100* 97* 97* 95* 95* 90*  CO2 28 32 30 31 30 25 28   GLUCOSE 377* 127* 295* 188* 226* 182* 275*  BUN 16 15 20  26* 35* 46* 51*  CREATININE 1.76* 1.62* 1.76* 2.13* 2.73* 4.48* 4.34*  CALCIUM 8.7* 8.7* 9.0 9.4 9.3 8.9 9.1   Liver Function Tests:  Recent Labs Lab 03/28/16 0407  AST 14*  ALT 10*  ALKPHOS 124  BILITOT 3.0*  PROT 7.0  ALBUMIN 3.0*     Recent Labs Lab 03/28/16 0407 04/01/16 0634 04/03/16 0524  WBC 7.0 5.3 9.6  NEUTROABS 4.8  --   --   HGB 13.9 14.4 15.9  HCT 40.9 44.8 47.1  MCV 88.9 92.6 90.8  PLT 243 283 307  ) Recent  Labs Lab 03/28/16 0407  TROPONINI 0.05*     Recent Labs Lab 04/02/16 0721 04/02/16 1130 04/02/16 1554 04/02/16 2225 04/03/16 1201  GLUCAP 202* 227* 115* 253* 152*   Studies/Results: Dg Chest Port 1 View  Result Date: 04/01/2016 CLINICAL DATA:  Acute on chronic systolic congestive heart failure. Nonischemic cardiomyopathy. EXAM: PORTABLE CHEST 1 VIEW COMPARISON:  03/28/2016 FINDINGS: Stable mild cardiomegaly. AICD remains in stable position with tip overlying the left ventricle. No pneumothorax visualized. Mild opacity in the left costophrenic angle may be due to atelectasis or small pleural effusion. Lung fields are otherwise clear. IMPRESSION: New opacity in left costophrenic angle, which may be due to atelectasis or pleural effusion. Electronically Signed   By: Earle Gell M.D.   On: 04/01/2016 20:57   Medications: . amiodarone 30 mg/hr (04/03/16 1200)  . DOBUTamine 5 mcg/kg/min (04/03/16 1200)  . heparin 1,650 Units/hr (04/03/16 1200)  . norepinephrine (LEVOPHED) Adult infusion 8 mcg/min (04/03/16 1200)   .  furosemide  160 mg Intravenous BID  . insulin aspart  0-20 Units Subcutaneous TID WC  . insulin aspart  6 Units Subcutaneous TID WC  . insulin glargine  25 Units Subcutaneous QHS  . magnesium oxide  400 mg Oral Daily    I  have reviewed scheduled and prn medications.  Background:  46 yo AAM with NICM with EF 7% by nuclear study 03/29/16, moderately reduced RV function, HTN, HLD, DM, and CKD Stage III. Presented to ED 1/16 for exertional dyspnea/CHF exacerbation; noted to have an AICD shock for polymorphic VT. On admission found to have aflutter with mild RVR and underwent TEE and ablation 1/19.  Renal was consulted 04/02/16 for increase in creatinine from 1.7 to 4.5 with associated oliguria. Marked volume overload, intolerant of milronone 2/2 hypotension, and getting high dose lasix and dobutamine. Baseline creatinine appears to be 1.4-1,5  generally  Assessment/Recommendations 1. Acute kidney injury superimposed on CKD Stage 3: due to decompensated CHF with severely reduced EF (7% by Cardiolyte).  Intrope (dobutamine)/NEpiu added and lasix increased 160 TID. UOP has since picked up to about 2 liters and creatinine has dropped just a hair. Dr. Aundra Dubin has backed off on lasix with CVP today down to 7. Getting lasix BID now. Not an outpatient dialysis candidate given severely reduced EF. No indication for CRRT at this time 2. Acute systolic CHF exacerbation: inotropes (dobutamine) and norepi if needed and lasix as above.  3. Afib/ flutter: s/p ablation 1/19, back in AF - on amio drip 4. Tanna Furry- s/p AICD shock 03/26/16; amio started but d/c'd due to CHF 5. DM II : Poorly controlled. Hgb A1c quite elevated 13.6. Unclear who manages. Only outpt med has been metformin.   Travis Maes, MD [facility] [phone] pager 04/03/2016, 12:55 PM

## 2016-04-03 NOTE — Progress Notes (Signed)
ANTICOAGULATION CONSULT NOTE - Follow Up Consult  Pharmacy Consult for Heparin Indication: atrial fibrillation  No Known Allergies  Patient Measurements: Height: 5\' 10"  (177.8 cm) Weight: 229 lb 0.9 oz (103.9 kg) IBW/kg (Calculated) : 73 Heparin Dosing Weight: 96kg  Vital Signs: Temp: 98.7 F (37.1 C) (01/22 1709) Temp Source: Oral (01/22 1709) BP: 115/87 (01/22 2000) Pulse Rate: 74 (01/22 2000)  Labs:  Recent Labs  04/01/16 7517 04/02/16 0234 04/03/16 0524 04/03/16 0616 04/03/16 1345 04/03/16 2120  HGB 14.4  --  15.9  --   --   --   HCT 44.8  --  47.1  --   --   --   PLT 283  --  307  --   --   --   APTT  --   --   --  45* 80* 95*  HEPARINUNFRC  --   --  2.20*  --   --   --   CREATININE 2.73* 4.48* 4.34*  --   --   --     Estimated Creatinine Clearance: 26 mL/min (by C-G formula based on SCr of 4.34 mg/dL (H)).   Medications:  . amiodarone 30 mg/hr (04/03/16 2100)  . DOBUTamine 5 mcg/kg/min (04/03/16 2000)  . heparin 1,650 Units/hr (04/03/16 2000)  . norepinephrine (LEVOPHED) Adult infusion 8 mcg/min (04/03/16 2030)     Assessment: 45yom transitioned from apixaban to heparin yesterday for afib given acute renal failure and need for possible CRRT.Last apixaban dose 1/21 @ 0940.Using aPTTs to guide initial heparin dosing as apixaban falsely elevates heparin levels.    APTT remains therapeutic at 95 seconds this evening. CBC stable. No bleeding.   Goal of Therapy:  Heparin level 0.3-0.7 aPTT 66-102 seconds Monitor platelets by anticoagulation protocol: Yes   Plan:  1) Continue heparin at 1650 units/hr 2) Daily aPTT and heparin level  [place] Crane, Vena [place], BCPS  Clinical Pharmacist Pager [phone]  04/03/2016 10:21 PM

## 2016-04-03 NOTE — Progress Notes (Addendum)
Patient ID: Travis Bryan, male   DOB: 1970-07-03, 46 y.o.   MRN: [id number]   SUBJECTIVE:   Underwent TEE and AFL ablation 1/19  EF 15-20% with moderate to severe RV HK  Lisinopril stopped due to low BP and worsening creatinine.   Bedside echo done showed EF 15-20. No periprocedural effusion.   Cardiolite: EF 7%, inferolateral fixed defect no ischemia.   Last night  amio started due to A fib RVR. BP soft so norepi added to dobutamine 5 mcg. Todays CO-OX 61%. Diuresing with high dose lasix. Increased urine output noted. CVP 7.   Denies SOB/CP  Scheduled Meds: . furosemide  160 mg Intravenous Q8H  . insulin aspart  0-20 Units Subcutaneous TID WC  . insulin aspart  6 Units Subcutaneous TID WC  . insulin glargine  25 Units Subcutaneous QHS  . magnesium oxide  400 mg Oral Daily  . sodium chloride flush  3 mL Intravenous Q12H  . sodium chloride flush  3 mL Intravenous Q12H   Continuous Infusions: . sodium chloride    . amiodarone 30 mg/hr (04/03/16 0430)  . DOBUTamine 5 mcg/kg/min (04/02/16 2000)  . heparin 1,650 Units/hr (04/03/16 0640)  . norepinephrine (LEVOPHED) Adult infusion 14 mcg/min (04/02/16 2341)   PRN Meds:.sodium chloride, acetaminophen, ondansetron (ZOFRAN) IV, oxyCODONE-acetaminophen, sodium chloride flush, sodium chloride flush, sodium chloride flush    Vitals:   04/03/16 0453 04/03/16 0500 04/03/16 0517 04/03/16 0600  BP:  91/77 101/88 101/77  Pulse:  (!) 50 (!) 48 (!) 28  Resp:  (!) 25 (!) 23 (!) 29  Temp:      TempSrc:      SpO2:  96% 96% 98%  Weight: 229 lb 0.9 oz (103.9 kg)     Height:        Intake/Output Summary (Last 24 hours) at 04/03/16 5784 Last data filed at 04/03/16 0600  Gross per 24 hour  Intake          1637.07 ml  Output             2050 ml  Net          -412.93 ml    LABS: Basic Metabolic Panel:  Recent Labs  04/02/16 0234 04/03/16 0524  NA 132* 131*  K 4.3 4.4  CL 95* 90*  CO2 25 28  GLUCOSE 182* 275*  BUN 46* 51*    CREATININE 4.48* 4.34*  CALCIUM 8.9 9.1  MG 1.8  --    Liver Function Tests: No results for input(s): AST, ALT, ALKPHOS, BILITOT, PROT, ALBUMIN in the last 72 hours. No results for input(s): LIPASE, AMYLASE in the last 72 hours. CBC:  Recent Labs  04/01/16 0634 04/03/16 0524  WBC 5.3 9.6  HGB 14.4 15.9  HCT 44.8 47.1  MCV 92.6 90.8  PLT 283 307   Cardiac Enzymes: No results for input(s): CKTOTAL, CKMB, CKMBINDEX, TROPONINI in the last 72 hours. BNP: Invalid input(s): POCBNP D-Dimer: No results for input(s): DDIMER in the last 72 hours. Hemoglobin A1C: No results for input(s): HGBA1C in the last 72 hours. Fasting Lipid Panel: No results for input(s): CHOL, HDL, LDLCALC, TRIG, CHOLHDL, LDLDIRECT in the last 72 hours. Thyroid Function Tests: No results for input(s): TSH, T4TOTAL, T3FREE, THYROIDAB in the last 72 hours.  Invalid input(s): FREET3 Anemia Panel: No results for input(s): VITAMINB12, FOLATE, FERRITIN, TIBC, IRON, RETICCTPCT in the last 72 hours.  RADIOLOGY: Dg Chest 2 View  Result Date: 03/28/2016 CLINICAL DATA:  Shortness of breath EXAM:  CHEST  2 VIEW COMPARISON:  04/25/2007 FINDINGS: Left-sided single lead pacing device with lead over right ventricle. Moderate cardiomegaly without overt failure. No acute infiltrate, consolidation, or pleural effusion. No pneumothorax peer IMPRESSION: Cardiomegaly without overt failure.  No acute infiltrate Electronically Signed   By: Donavan Foil M.D.   On: 03/28/2016 01:55   Nm Myocar Multi W/spect W/wall Motion / Ef  Result Date: 03/29/2016 CLINICAL DATA:  History of cardiomyopathy and atrial flutter. Acute on chronic systolic CHF. EXAM: MYOCARDIAL IMAGING WITH SPECT (REST AND PHARMACOLOGIC-STRESS) GATED LEFT VENTRICULAR WALL MOTION STUDY LEFT VENTRICULAR EJECTION FRACTION TECHNIQUE: Standard myocardial SPECT imaging was performed after resting intravenous injection of 10 mCi Tc-77m tetrofosmin. Subsequently, intravenous  infusion of Lexiscan was performed under the supervision of the Cardiology staff. At peak effect of the drug, 30 mCi Tc-39m tetrofosmin was injected intravenously and standard myocardial SPECT imaging was performed. Quantitative gated imaging was also performed to evaluate left ventricular wall motion, and estimate left ventricular ejection fraction. COMPARISON:  None. FINDINGS: Perfusion: There is a large fixed defect involving the inferolateral wall compatible with an infarct. There is no evidence to suggest reversibility or ischemia. Left ventricle is dilated. Wall Motion: There is markedly abnormal wall motion throughout the left ventricle. Severe hypokinesia throughout the left ventricle and difficult to exclude areas of paradoxical motion due to the minimal wall motion. Left Ventricular Ejection Fraction: 7 % End diastolic volume 009 ml End systolic volume 381 ml IMPRESSION: 1. Large infarct involving the inferolateral wall. No evidence for ischemia or reversibility. 2. Markedly abnormal wall motion throughout the left ventricle. Severe hypokinesia throughout the left ventricle. 3. Left ventricular ejection fraction is 7%. 4. Non invasive risk stratification*: High *2012 Appropriate Use Criteria for Coronary Revascularization Focused Update: J Am Coll Cardiol. 8299;37(1):696-789. http://content.airportbarriers.com.aspx?[email or web address] Electronically Signed   By: Markus Daft M.D.   On: 03/29/2016 13:52   Dg Chest Port 1 View  Result Date: 04/01/2016 CLINICAL DATA:  Acute on chronic systolic congestive heart failure. Nonischemic cardiomyopathy. EXAM: PORTABLE CHEST 1 VIEW COMPARISON:  03/28/2016 FINDINGS: Stable mild cardiomegaly. AICD remains in stable position with tip overlying the left ventricle. No pneumothorax visualized. Mild opacity in the left costophrenic angle may be due to atelectasis or small pleural effusion. Lung fields are otherwise clear. IMPRESSION: New opacity in left costophrenic  angle, which may be due to atelectasis or pleural effusion. Electronically Signed   By: Earle Gell M.D.   On: 04/01/2016 20:57    PHYSICAL EXAM CVP 7 General: NAD.  HEENT: poor dentition Neck: JVP 7-8 Nothyromegaly or thyroid nodule.  Lungs: Decreased in the bases.  CV: Lateral PMI.  Heart irregular S1/S2, +prominent s3 no murmur.  No edema. Extremities warm.  Abdomen: Soft, nontender, no hepatosplenomegaly, ++distention.  Neurologic: Alert and oriented x 3.  Psych: Flat affect. Extremities: No clubbing or cyanosis.   TELEMETRY:  Aflutter 80s .   ASSESSMENT AND PLAN: 46 yo with long history of nonischemic cardiomyopathy, HTN, DM, and CKD stage III presented with exertional dyspnea/CHF exacerbation and was noted to have had ICD discharge on 1/14 for polymorphic VT.  1. Atrial flutter with mild RVR: Newly noted, was in atrial flutter at admission.   - Now s/p AFL ablation 1/19.  - In A fib RVR over night. Todays rate is controlled. Continue amio drip at 30 mg per hour. I discussed DCCV with EP this morning and since rate controlled will likely need to hold off until we can wean pressors.   -  Continue heparin with AKI.  May need Trialysis catheter 2. Acute on chronic systolic CHF -> cardiogenic shock: EF 04/02/2016  EF 20% on echo with moderate to severely decreased RV systolic function.  Possibly hypertensive cardiomyopathy.  Had cath around time of diagnosis in 2006 without significant coronary disease.  Cardiolite 1/17 showed EF 7% with inferolateral fixed defect, no ischemia (possible artifact).   --TEE with severe biventricular dysfunction --He is in cardiogenic shock with marked volume overload. ACE and BB stopped --Remains on dobutamine to 5+ norepi 14 mcg. Try to wean norepi. Todays CO-OX is 61%.  --CVP down to 7. Cut back lasix to 160 mg twice a day. May need to stop later today.  -3. Acute on CKD: Stage IV.   --Creatinine a little better. Likely low output cardio-renal -> ATN. See  above 4. DM: Poor control => hgbA1c 13.6.  He does not know who manages this at home (?no PCP).  Has just been taking metformin.  On insulin in hospital, adjusting with help of diabetes nurse coordinator.  Need care management assistance for home health and to see if we can find him a PCP.   5. VT: Polymorphic VT on 1/14 with ICD discharge, VT on 01/11/16 with ICD discharge.  Suspect related to CHF exacerbation rather than ischemia. On Amio drip with A fib RVR. --Keep K> 4 and mag > 2.  K stable today. Check Mag in am.   Amy Clegg NP-C  04/03/2016 7:12 AM   Patient seen with NP, agree with the above note.  BP stable on current support with co-ox 61%.  CVP 10 on my check.  Will decrease Lasix to 160 mg IV bid today and will try to titrate down on norepinephrine.  He is in atrial fibrillation, will require DCCV eventually (had TEE prior to atrial flutter ablation so will not require repeat TEE as he has been continuously anticoagulated => now on heparin gtt).  Would hold off until pressor/inotrope requirement improved.    35 minutes critical care time.   Loralie Champagne 04/03/2016 8:35 AM

## 2016-04-03 NOTE — Progress Notes (Signed)
SUBJECTIVE: The patient reports that his shortness of breath is stable.  No chest pain. He does not have awareness of AF.   CURRENT MEDICATIONS: . furosemide  160 mg Intravenous BID  . insulin aspart  0-20 Units Subcutaneous TID WC  . insulin aspart  6 Units Subcutaneous TID WC  . insulin glargine  25 Units Subcutaneous QHS  . magnesium oxide  400 mg Oral Daily   . amiodarone 30 mg/hr (04/03/16 1200)  . DOBUTamine 5 mcg/kg/min (04/03/16 1200)  . heparin 1,650 Units/hr (04/03/16 1200)  . norepinephrine (LEVOPHED) Adult infusion 8 mcg/min (04/03/16 1200)    OBJECTIVE: Physical Exam: Vitals:   04/03/16 1100 04/03/16 1115 04/03/16 1130 04/03/16 1159  BP: 106/66 (!) 86/73 (!) 117/93   Pulse: 67 (!) 38 (!) 35   Resp: 19 20 20    Temp:    98.5 F (36.9 C)  TempSrc:    Oral  SpO2: 97% 98% 97%   Weight:      Height:        Intake/Output Summary (Last 24 hours) at 04/03/16 1247 Last data filed at 04/03/16 1200  Gross per 24 hour  Intake          2090.02 ml  Output             3025 ml  Net          -934.98 ml    Telemetry reveals rate controlled AF   GEN- The patient is chronically ill appearing, alert and oriented x 3 today.   Head- normocephalic, atraumatic Eyes-  Sclera clear, conjunctiva pink Ears- hearing intact Oropharynx- clear, poor dentition Neck- supple  Lungs- Clear to ausculation bilaterally, normal work of breathing Heart- Irregular rate and rhythm  GI- soft, NT, ND, + BS Extremities- no clubbing, cyanosis, or edema Skin- no rash or lesion Psych- euthymic mood, full affect Neuro- strength and sensation are intact  LABS: Basic Metabolic Panel:  Recent Labs  04/02/16 0234 04/03/16 0524  NA 132* 131*  K 4.3 4.4  CL 95* 90*  CO2 25 28  GLUCOSE 182* 275*  BUN 46* 51*  CREATININE 4.48* 4.34*  CALCIUM 8.9 9.1  MG 1.8  --    CBC:  Recent Labs  04/01/16 0634 04/03/16 0524  WBC 5.3 9.6  HGB 14.4 15.9  HCT 44.8 47.1  MCV 92.6 90.8  PLT 283  307    RADIOLOGY: Dg Chest Port 1 View Result Date: 04/01/2016 CLINICAL DATA:  Acute on chronic systolic congestive heart failure. Nonischemic cardiomyopathy. EXAM: PORTABLE CHEST 1 VIEW COMPARISON:  03/28/2016 FINDINGS: Stable mild cardiomegaly. AICD remains in stable position with tip overlying the left ventricle. No pneumothorax visualized. Mild opacity in the left costophrenic angle may be due to atelectasis or small pleural effusion. Lung fields are otherwise clear. IMPRESSION: New opacity in left costophrenic angle, which may be due to atelectasis or pleural effusion. Electronically Signed   By: Earle Gell M.D.   On: 04/01/2016 20:57    ASSESSMENT AND PLAN:  Active Problems:   Acute on chronic clinical systolic heart failure (HCC)   AKI (acute kidney injury) ([place])   Typical atrial flutter ([place])  1. Persistent atrial flutter/fibrillation Newly diagnosed this admission.  He is s/p flutter ablation last week, but unfortunately has developed atrial fibrillation since requiring pressors Continue [place] long term for CHADS2VASC of 2 Would continue amiodarone for now and hold off on DCCV until weaned from pressors to give better chance at maintaining SR  2.  PMVT No recurrence this admission Continue amiodarone for now  3.  CKD stage III Stable No change required today  4.  Acute on chronic systolic heart failure Management per AHF team  Chanetta Marshall, NP 04/03/2016 12:51 PM    EP Attending  Patient seen and examined. Agree with above. He has a low output state. He will require more amio to maintain NSR. I would suggest several more days followed by DCCV.  Mikle Bosworth.D.

## 2016-04-03 NOTE — Progress Notes (Signed)
ANTICOAGULATION CONSULT NOTE - Follow Up Consult  Pharmacy Consult for Heparin (Apixaban on hold) Indication: atrial fibrillation  No Known Allergies  Patient Measurements: Height: 5\' 10"  (177.8 cm) Weight: 229 lb 0.9 oz (103.9 kg) IBW/kg (Calculated) : 73  Vital Signs: Temp: 98.4 F (36.9 C) (01/22 0400) Temp Source: Oral (01/22 0400) BP: 101/77 (01/22 0600) Pulse Rate: 28 (01/22 0600)  Labs:  Recent Labs  04/01/16 0634 04/02/16 0234 04/03/16 0524 04/03/16 0616  HGB 14.4  --  15.9  --   HCT 44.8  --  47.1  --   PLT 283  --  307  --   APTT  --   --   --  45*  HEPARINUNFRC  --   --  2.20*  --   CREATININE 2.73* 4.48* 4.34*  --     Estimated Creatinine Clearance: 26 mL/min (by C-G formula based on SCr of 4.34 mg/dL (H)).   Assessment: Heparin while apixaban on hold with possible need for procedures, initial aPTT is low, using aPTT to guide dosing for now given apixaban influence on anti-Xa levels, no issues per RN.   Goal of Therapy:  Heparin level 0.3-0.7 units/ml aPTT 66-102 seconds Monitor platelets by anticoagulation protocol: Yes   Plan:  -Inc heparin to 1650 units/hr -1400 aPTT  Narda Bonds 04/03/2016,6:39 AM

## 2016-04-03 NOTE — Progress Notes (Signed)
ANTICOAGULATION CONSULT NOTE - Follow Up Consult  Pharmacy Consult for Heparin Indication: atrial fibrillation  No Known Allergies  Patient Measurements: Height: 5\' 10"  (177.8 cm) Weight: 229 lb 0.9 oz (103.9 kg) IBW/kg (Calculated) : 73 Heparin Dosing Weight: 96kg  Vital Signs: Temp: 98.5 F (36.9 C) (01/22 1159) Temp Source: Oral (01/22 1159) BP: 119/83 (01/22 1230) Pulse Rate: 43 (01/22 1230)  Labs:  Recent Labs  04/01/16 5183 04/02/16 0234 04/03/16 0524 04/03/16 0616 04/03/16 1345  HGB 14.4  --  15.9  --   --   HCT 44.8  --  47.1  --   --   PLT 283  --  307  --   --   APTT  --   --   --  45* 80*  HEPARINUNFRC  --   --  2.20*  --   --   CREATININE 2.73* 4.48* 4.34*  --   --     Estimated Creatinine Clearance: 26 mL/min (by C-G formula based on SCr of 4.34 mg/dL (H)).   Medications:  Heparin @ 1650 units/hr  Assessment: 45yom transitioned from apixaban to heparin yesterday for afib given acute renal failure and need for possible CRRT.Last apixaban dose 1/21 @ 0940.Using aPTTs to guide initial heparin dosing as apixaban falsely elevates heparin levels.  APTT is now therapeutic at 80 seconds. CBC stable. No bleeding.   Goal of Therapy:  Heparin level 0.3-0.7 aPTT 66-102 seconds Monitor platelets by anticoagulation protocol: Yes   Plan:  1) Continue heparin at 1650 units/hr 2) 8 hour aPTT to confirm 3) Daily aPTT and heparin level  Deboraha Sprang 04/03/2016,2:18 PM

## 2016-04-03 NOTE — Plan of Care (Signed)
Problem: Education: Goal: Ability to demonstrate managment of disease process will improve Outcome: Progressing Needs reinforcement

## 2016-04-04 LAB — CBC
HEMATOCRIT: 44.7 % (ref 39.0–52.0)
HEMOGLOBIN: 14.8 g/dL (ref 13.0–17.0)
MCH: 29.8 pg (ref 26.0–34.0)
MCHC: 33.1 g/dL (ref 30.0–36.0)
MCV: 89.9 fL (ref 78.0–100.0)
Platelets: 277 10*3/uL (ref 150–400)
RBC: 4.97 MIL/uL (ref 4.22–5.81)
RDW: 14 % (ref 11.5–15.5)
WBC: 6.1 10*3/uL (ref 4.0–10.5)

## 2016-04-04 LAB — BASIC METABOLIC PANEL
Anion gap: 12 (ref 5–15)
BUN: 58 mg/dL — AB (ref 6–20)
CHLORIDE: 89 mmol/L — AB (ref 101–111)
CO2: 28 mmol/L (ref 22–32)
CREATININE: 3.9 mg/dL — AB (ref 0.61–1.24)
Calcium: 9 mg/dL (ref 8.9–10.3)
GFR calc Af Amer: 20 mL/min — ABNORMAL LOW (ref >60)
GFR calc non Af Amer: 17 mL/min — ABNORMAL LOW (ref >60)
GLUCOSE: 268 mg/dL — AB (ref 65–99)
Potassium: 3.6 mmol/L (ref 3.5–5.1)
Sodium: 129 mmol/L — ABNORMAL LOW (ref 135–145)

## 2016-04-04 LAB — COOXEMETRY PANEL
Carboxyhemoglobin: 1.2 % (ref 0.5–1.5)
Methemoglobin: 0.9 % (ref 0.0–1.5)
O2 SAT: 60.6 %
Total hemoglobin: 15.2 g/dL (ref 12.0–16.0)

## 2016-04-04 LAB — GLUCOSE, CAPILLARY
Glucose-Capillary: 110 mg/dL — ABNORMAL HIGH (ref 65–99)
Glucose-Capillary: 149 mg/dL — ABNORMAL HIGH (ref 65–99)
Glucose-Capillary: 155 mg/dL — ABNORMAL HIGH (ref 65–99)
Glucose-Capillary: 137 mg/dL — ABNORMAL HIGH (ref 65–99)

## 2016-04-04 LAB — APTT: APTT: 94 s — AB (ref 24–36)

## 2016-04-04 LAB — HEPARIN LEVEL (UNFRACTIONATED): HEPARIN UNFRACTIONATED: 1.16 [IU]/mL — AB (ref 0.30–0.70)

## 2016-04-04 LAB — MAGNESIUM: Magnesium: 2.4 mg/dL (ref 1.7–2.4)

## 2016-04-04 MED ORDER — POTASSIUM CHLORIDE CRYS ER 20 MEQ PO TBCR
40.0000 meq | EXTENDED_RELEASE_TABLET | Freq: Once | ORAL | Status: AC
  Administered 2016-04-04: 40 meq via ORAL
  Filled 2016-04-04: qty 2

## 2016-04-04 MED ORDER — METOLAZONE 5 MG PO TABS
5.0000 mg | ORAL_TABLET | Freq: Once | ORAL | Status: AC
  Administered 2016-04-04: 5 mg via ORAL
  Filled 2016-04-04: qty 1

## 2016-04-04 MED ORDER — ISOSORB DINITRATE-HYDRALAZINE 20-37.5 MG PO TABS
0.5000 | ORAL_TABLET | Freq: Three times a day (TID) | ORAL | Status: DC
  Administered 2016-04-04 (×3): 0.5 via ORAL
  Filled 2016-04-04 (×3): qty 1

## 2016-04-04 NOTE — Progress Notes (Signed)
Please see previous NCM notes NCM spoke to pt and explained importance of having his insurance info on file. States he will call his employer to see if they can fax [employer] info to unit. Jonnie Finner RN CCM Case Mgmt phone [phone]

## 2016-04-04 NOTE — Progress Notes (Addendum)
Patient ID: Travis Bryan, male   DOB: 14-Apr-1970, 46 y.o.   MRN: [id number]   SUBJECTIVE:   Admitted with acute/chronic systolic CHF and atrial flutter. Underwent TEE and AFL ablation 1/19.  Post-procedure developed AKI and cardiogenic shock, started on dobutamine and norepinephrine.  He subsequently went into atrial flutter with controlled rate on amiodarone.   Off norepinephrine now, on dobutamine 5.  BP stable, co-ox 61%.   CVP 12-13 this morning.  Denies dyspnea.  UOP more sluggish with lower Lasix dose yesterday.  Creatinine trending down but BUN trending up.   EF 15-20% with moderate to severe RV HK  Bedside echo done showed EF 15-20%. No periprocedural effusion.   Cardiolite: EF 7%, inferolateral fixed defect no ischemia.   Scheduled Meds: . furosemide  160 mg Intravenous BID  . insulin aspart  0-20 Units Subcutaneous TID WC  . insulin aspart  6 Units Subcutaneous TID WC  . insulin glargine  25 Units Subcutaneous QHS  . isosorbide-hydrALAZINE  0.5 tablet Oral TID  . magnesium oxide  400 mg Oral Daily  . metolazone  5 mg Oral Once  . potassium chloride  40 mEq Oral Once   Continuous Infusions: . amiodarone 30 mg/hr (04/03/16 2100)  . DOBUTamine 5 mcg/kg/min (04/03/16 2000)  . heparin 1,650 Units/hr (04/03/16 2000)  . norepinephrine (LEVOPHED) Adult infusion Stopped (04/04/16 0335)   PRN Meds:.acetaminophen, ondansetron (ZOFRAN) IV, oxyCODONE-acetaminophen, sodium chloride flush    Vitals:   04/04/16 0400 04/04/16 0500 04/04/16 0600 04/04/16 0700  BP: (!) 127/97 (!) 131/101 (!) 135/100 (!) 119/97  Pulse: (!) 36 (!) 57 (!) 34 (!) 46  Resp: (!) 24 19 16 16   Temp: 98.7 F (37.1 C)     TempSrc: Oral     SpO2: 98% 98% 98% 96%  Weight:   226 lb 6.6 oz (102.7 kg)   Height:        Intake/Output Summary (Last 24 hours) at 04/04/16 0732 Last data filed at 04/04/16 0700  Gross per 24 hour  Intake          1973.83 ml  Output             1306 ml  Net           667.83 ml      LABS: Basic Metabolic Panel:  Recent Labs  04/02/16 0234 04/03/16 0524 04/04/16 0334  NA 132* 131* 129*  K 4.3 4.4 3.6  CL 95* 90* 89*  CO2 25 28 28   GLUCOSE 182* 275* 268*  BUN 46* 51* 58*  CREATININE 4.48* 4.34* 3.90*  CALCIUM 8.9 9.1 9.0  MG 1.8  --  2.4   Liver Function Tests: No results for input(s): AST, ALT, ALKPHOS, BILITOT, PROT, ALBUMIN in the last 72 hours. No results for input(s): LIPASE, AMYLASE in the last 72 hours. CBC:  Recent Labs  04/03/16 0524 04/04/16 0334  WBC 9.6 6.1  HGB 15.9 14.8  HCT 47.1 44.7  MCV 90.8 89.9  PLT 307 277   Cardiac Enzymes: No results for input(s): CKTOTAL, CKMB, CKMBINDEX, TROPONINI in the last 72 hours. BNP: Invalid input(s): POCBNP D-Dimer: No results for input(s): DDIMER in the last 72 hours. Hemoglobin A1C: No results for input(s): HGBA1C in the last 72 hours. Fasting Lipid Panel: No results for input(s): CHOL, HDL, LDLCALC, TRIG, CHOLHDL, LDLDIRECT in the last 72 hours. Thyroid Function Tests: No results for input(s): TSH, T4TOTAL, T3FREE, THYROIDAB in the last 72 hours.  Invalid input(s): FREET3 Anemia Panel: No  results for input(s): VITAMINB12, FOLATE, FERRITIN, TIBC, IRON, RETICCTPCT in the last 72 hours.  RADIOLOGY: Dg Chest 2 View  Result Date: 03/28/2016 CLINICAL DATA:  Shortness of breath EXAM: CHEST  2 VIEW COMPARISON:  04/25/2007 FINDINGS: Left-sided single lead pacing device with lead over right ventricle. Moderate cardiomegaly without overt failure. No acute infiltrate, consolidation, or pleural effusion. No pneumothorax peer IMPRESSION: Cardiomegaly without overt failure.  No acute infiltrate Electronically Signed   By: Donavan Foil M.D.   On: 03/28/2016 01:55   Nm Myocar Multi W/spect W/wall Motion / Ef  Result Date: 03/29/2016 CLINICAL DATA:  History of cardiomyopathy and atrial flutter. Acute on chronic systolic CHF. EXAM: MYOCARDIAL IMAGING WITH SPECT (REST AND PHARMACOLOGIC-STRESS) GATED  LEFT VENTRICULAR WALL MOTION STUDY LEFT VENTRICULAR EJECTION FRACTION TECHNIQUE: Standard myocardial SPECT imaging was performed after resting intravenous injection of 10 mCi Tc-63m tetrofosmin. Subsequently, intravenous infusion of Lexiscan was performed under the supervision of the Cardiology staff. At peak effect of the drug, 30 mCi Tc-61m tetrofosmin was injected intravenously and standard myocardial SPECT imaging was performed. Quantitative gated imaging was also performed to evaluate left ventricular wall motion, and estimate left ventricular ejection fraction. COMPARISON:  None. FINDINGS: Perfusion: There is a large fixed defect involving the inferolateral wall compatible with an infarct. There is no evidence to suggest reversibility or ischemia. Left ventricle is dilated. Wall Motion: There is markedly abnormal wall motion throughout the left ventricle. Severe hypokinesia throughout the left ventricle and difficult to exclude areas of paradoxical motion due to the minimal wall motion. Left Ventricular Ejection Fraction: 7 % End diastolic volume 253 ml End systolic volume 664 ml IMPRESSION: 1. Large infarct involving the inferolateral wall. No evidence for ischemia or reversibility. 2. Markedly abnormal wall motion throughout the left ventricle. Severe hypokinesia throughout the left ventricle. 3. Left ventricular ejection fraction is 7%. 4. Non invasive risk stratification*: High *2012 Appropriate Use Criteria for Coronary Revascularization Focused Update: J Am Coll Cardiol. 4034;74(2):595-638. http://content.airportbarriers.com.aspx?articleid=[email or web address] Electronically Signed   By: Markus Daft M.D.   On: 03/29/2016 13:52   Dg Chest Port 1 View  Result Date: 04/01/2016 CLINICAL DATA:  Acute on chronic systolic congestive heart failure. Nonischemic cardiomyopathy. EXAM: PORTABLE CHEST 1 VIEW COMPARISON:  03/28/2016 FINDINGS: Stable mild cardiomegaly. AICD remains in stable position with tip overlying the  left ventricle. No pneumothorax visualized. Mild opacity in the left costophrenic angle may be due to atelectasis or small pleural effusion. Lung fields are otherwise clear. IMPRESSION: New opacity in left costophrenic angle, which may be due to atelectasis or pleural effusion. Electronically Signed   By: Earle Gell M.D.   On: 04/01/2016 20:57    PHYSICAL EXAM CVP 12-13 General: NAD.  HEENT: poor dentition Neck: JVP 10-12. No thyromegaly or thyroid nodule.  Lungs: Decreased in the bases.  CV: Lateral PMI.  Heart irregular S1/S2, +prominent s3 no murmur.  No edema. Extremities warm.  Abdomen: Soft, nontender, no hepatosplenomegaly, no distention.  Neurologic: Alert and oriented x 3.  Psych: Flat affect. Extremities: No clubbing or cyanosis.   TELEMETRY:  Atrial fibrillation 80s   ASSESSMENT AND PLAN: 46 yo with long history of nonischemic cardiomyopathy, HTN, DM, and CKD stage III presented with exertional dyspnea/CHF exacerbation and was noted to have had ICD discharge on 1/14 for polymorphic VT.  1. Atrial flutter with mild RVR => atrial fibrillation: Newly noted, was in atrial flutter at admission.  Now s/p AFL ablation 1/19.  Currently in atrial fibrillation.  - Continue amiodarone gtt,  will plan DCCV when inotrope titrated down.   He has been continuously anticoagulated.  - Continue heparin with AKI.  2. Acute on chronic systolic CHF -> cardiogenic shock: EF 04/02/2016  EF 20% on echo with moderate to severely decreased RV systolic function.  Possibly hypertensive cardiomyopathy.  Had cath around time of diagnosis in 2006 without significant coronary disease.  Cardiolite 1/17 showed EF 7% with inferolateral fixed defect, no ischemia (possible artifact).  TEE with severe biventricular dysfunction.  He developed cardiogenic shock with AKI after flutter ablation.  Today, he is off norepinephrine but still on dobutamine 5 with co-ox 61% and CVP 12-13.   - Continue dobutamine 5 today, ?start  wean tomorrow.  - Continue Lasix 160 mg IV bid but add metolazone 5 mg x 1.  - Good BP, will add Bidil 1/2 tab tid.  3. Acute on CKD: Stage IV.  Creatinine better but BUN worse. Likely low output cardio-renal -> ATN. See above 4. DM: Poor control => hgbA1c 13.6.  He does not know who manages this at home (?no PCP).  Has just been taking metformin.  On insulin in hospital, adjusting with help of diabetes nurse coordinator.  Need care management assistance for home health and to see if we can find him a PCP.   5. VT: Polymorphic VT on 1/14 with ICD discharge, VT on 01/11/16 with ICD discharge.  Suspect related to CHF exacerbation rather than ischemia. On Amio drip with A fib RVR. - Keep K> 4 and mag > 2.  Replace K today.   35 minutes critical care time.   Loralie Champagne 04/04/2016 7:32 AM

## 2016-04-04 NOTE — Progress Notes (Signed)
CARDIAC REHAB PHASE I   PRE:  Rate/Rhythm: 75 afib few PVCs  BP:  Supine:   Sitting: 102/69  Standing:    SaO2: 95% 2L   92%RA  MODE:  Ambulation: 390 ft   POST:  Rate/Rhythm: heart rate up to 138 afib ? BBB  Wide but did not look like VT        78 after resting  BP:  Supine:   Sitting: 117/92  Standing:    SaO2: 95%RA   Back to 2L in room 1400-1448 Pt walked 390 ft with steady gait, the only complaint being ? Gout left foot.  Stated left foot a little painful. To recliner after walk. [name] heart rate up to 138 at the highest but pt asymptomatic. Denied SOB or dizziness. To  Recliner after walk. Call bell in reach. Left CHF booklet. Pt stated he does not have scales.  Would be good to have family here for ed. Will continue to assess. Heart rate to 78 with rest and narrow again.   Graylon Good, RN BSN  04/04/2016 2:43 PM

## 2016-04-04 NOTE — Progress Notes (Signed)
ANTICOAGULATION CONSULT NOTE - Follow Up Consult  Pharmacy Consult for Heparin Indication: atrial fibrillation  No Known Allergies  Patient Measurements: Height: 5\' 10"  (177.8 cm) Weight: 226 lb 6.6 oz (102.7 kg) IBW/kg (Calculated) : 73 Heparin Dosing Weight: 96kg  Vital Signs: Temp: 97.8 F (36.6 C) (01/23 0819) Temp Source: Oral (01/23 0819) BP: 119/97 (01/23 0700) Pulse Rate: 46 (01/23 0700)  Labs:  Recent Labs  04/02/16 0234 04/03/16 0524  04/03/16 1345 04/03/16 2120 04/04/16 0334  HGB  --  15.9  --   --   --  14.8  HCT  --  47.1  --   --   --  44.7  PLT  --  307  --   --   --  277  APTT  --   --   < > 80* 95* 94*  HEPARINUNFRC  --  2.20*  --   --   --  1.16*  CREATININE 4.48* 4.34*  --   --   --  3.90*  < > = values in this interval not displayed.  Estimated Creatinine Clearance: 28.7 mL/min (by C-G formula based on SCr of 3.9 mg/dL (H)).   Medications:  Heparin @ 1650 units/hr  Assessment: 45yom transitioned from apixaban to heparin 1/21 for afib given acute renal failure and need for possible CRRT.Last apixaban dose 1/21 @ 0940. Using aPTTs to guide initial heparin dosing as apixaban falsely elevates heparin levels.    APTT is therapeutic at 94 seconds. Heparin level remains elevated at 1.16 but trending down. CBC stable. No bleeding.   Goal of Therapy:  Heparin level 0.3-0.7 aPTT 66-102 seconds Monitor platelets by anticoagulation protocol: Yes   Plan:  1) Continue heparin at 1650 units/hr 2) Daily aPTT and heparin level  Deboraha Sprang 04/04/2016,9:59 AM

## 2016-04-04 NOTE — Progress Notes (Signed)
[facility] Rounding Note Subjective/Interval History:  Remains difficult to engage in any kind of conversation Denies SOB or pain HR as low as 30's currently 90's on IV Amio Norepi off/dobutamine at 5 UOP  1.4 liters/24 hours (on reduced lasix at BID)  Objective  Intake/Output Summary (Last 24 hours) at 04/04/16 0701 Last data filed at 04/04/16 0100  Gross per 24 hour  Intake           1719.4 ml  Output             1436 ml  Net            283.4 ml   Physical Exam:  Blood pressure (!) 135/100, pulse (!) 34, temperature 98.7 F (37.1 C), temperature source Oral, resp. rate 16, height 5\' 10"  (1.778 m), weight 102.7 kg (226 lb 6.6 oz), SpO2 98 %.  NAD, lying in bed Hard to engage Poor dentition BS decreased in the bases.  No crackles Heart irregularly irreg  S1/S2  +prominent S3  No murmur.    Abd soft, nontender, mild distention unchanged.  Awakens, alert, extremely flat affect. No LE edema  Labs:   Recent Labs Lab 03/29/16 1611 03/30/16 0533 03/31/16 2353 04/01/16 6144 04/02/16 0234 04/03/16 0524 04/04/16 0334  NA 138 136 136 135 132* 131* 129*  K 3.4* 3.7 3.5 4.5 4.3 4.4 3.6  CL 100* 97* 97* 95* 95* 90* 89*  CO2 32 30 31 30 25 28 28   GLUCOSE 127* 295* 188* 226* 182* 275* 268*  BUN 15 20 26* 35* 46* 51* 58*  CREATININE 1.62* 1.76* 2.13* 2.73* 4.48* 4.34* 3.90*  CALCIUM 8.7* 9.0 9.4 9.3 8.9 9.1 9.0    Recent Labs Lab 04/01/16 0634 04/03/16 0524 04/04/16 0334  WBC 5.3 9.6 6.1  HGB 14.4 15.9 14.8  HCT 44.8 47.1 44.7  MCV 92.6 90.8 89.9  PLT 283 307 277    Recent Labs Lab 04/02/16 2225 04/03/16 0809 04/03/16 1201 04/03/16 1656 04/03/16 2119  GLUCAP 253* 260* 152* 179* 395*    Medications: . amiodarone 30 mg/hr (04/03/16 2100)  . DOBUTamine 5 mcg/kg/min (04/03/16 2000)  . heparin 1,650 Units/hr (04/03/16 2000)  . norepinephrine (LEVOPHED) Adult infusion Stopped (04/04/16 0335)   . furosemide  160 mg Intravenous BID  . insulin aspart  0-20 Units  Subcutaneous TID WC  . insulin aspart  6 Units Subcutaneous TID WC  . insulin glargine  25 Units Subcutaneous QHS  . magnesium oxide  400 mg Oral Daily    Background:  46 yo AAM with NICM with EF 7% by nuclear study 03/29/16 (20% by ECHO) and mod to severely reduced RV function, HTN, HLD, uncontrolled DM, and CKD Stage III at baseline. Presented to ED 1/16 for exertional dyspnea/CHF exacerbation. Noted to have an AICD shock for polymorphic VT. On admission ->aflutter with mild RVR ->TEE and ablation 1/19 (w recurrence of AFF).  Renal consulted 04/02/16 for increase in creatinine from 1.7 to 4.5 with oliguria. Marked volume overload, intolerant of milronone 2/2 hypotension, and on high dose lasix and dobutamine. Baseline creatinine appears to be 1.4-1.5 generally  Assessment/Recommendations  1. AKI superimposed on CKD Stage III  2/2 cardiorenal syndrome (decompensated biventricular CHF with severely reduced EF - 7% by Cardiolyte/20% by echo).  Lasix reduced 1/22 from tid->bid, norepi off during the night. Still reasonable UOP and creatinine starting to decline, but CVP back up to 12 this AM. Cards has not seen yet (Dr. Aundra Dubin is managing Lasix).  NOT an  outpatient dialysis candidate given severely reduced EF. No indication for CRRT but given his severe HF, would have to think long and hard about whether that intervention even appropriate in this patient.  2. Acute systolic CHF exacerbation: remains on dobutamine and lasix  3. Afib/ flutter: s/p ablation 1/19, back in AF - on amio drip; EP following 4. VT- s/p AICD shock 03/26/16 5. DM II : Poorly controlled. Hgb A1c quite elevated 13.6. Unclear who manages. Only outpt med has been metformin.   Jamal Maes, MD [facility] [phone] pager 04/04/2016, 7:01 AM

## 2016-04-05 LAB — BASIC METABOLIC PANEL
Anion gap: 13 (ref 5–15)
BUN: 60 mg/dL — AB (ref 6–20)
CO2: 30 mmol/L (ref 22–32)
Calcium: 9.3 mg/dL (ref 8.9–10.3)
Chloride: 89 mmol/L — ABNORMAL LOW (ref 101–111)
Creatinine, Ser: 3.46 mg/dL — ABNORMAL HIGH (ref 0.61–1.24)
GFR, EST AFRICAN AMERICAN: 23 mL/min — AB (ref >60)
GFR, EST NON AFRICAN AMERICAN: 20 mL/min — AB (ref >60)
Glucose, Bld: 177 mg/dL — ABNORMAL HIGH (ref 65–99)
POTASSIUM: 3.5 mmol/L (ref 3.5–5.1)
SODIUM: 132 mmol/L — AB (ref 135–145)

## 2016-04-05 LAB — CBC
HEMATOCRIT: 41.8 % (ref 39.0–52.0)
Hemoglobin: 14.1 g/dL (ref 13.0–17.0)
MCH: 30.3 pg (ref 26.0–34.0)
MCHC: 33.7 g/dL (ref 30.0–36.0)
MCV: 89.7 fL (ref 78.0–100.0)
PLATELETS: 258 10*3/uL (ref 150–400)
RBC: 4.66 MIL/uL (ref 4.22–5.81)
RDW: 13.8 % (ref 11.5–15.5)
WBC: 5.3 10*3/uL (ref 4.0–10.5)

## 2016-04-05 LAB — GLUCOSE, CAPILLARY
GLUCOSE-CAPILLARY: 91 mg/dL (ref 65–99)
Glucose-Capillary: 180 mg/dL — ABNORMAL HIGH (ref 65–99)
Glucose-Capillary: 143 mg/dL — ABNORMAL HIGH (ref 65–99)
Glucose-Capillary: 146 mg/dL — ABNORMAL HIGH (ref 65–99)

## 2016-04-05 LAB — APTT
aPTT: 116 seconds — ABNORMAL HIGH (ref 24–36)
aPTT: 77 seconds — ABNORMAL HIGH (ref 24–36)

## 2016-04-05 LAB — COOXEMETRY PANEL
Carboxyhemoglobin: 1.1 % (ref 0.5–1.5)
Methemoglobin: 0.8 % (ref 0.0–1.5)
O2 SAT: 60.8 %
Total hemoglobin: 14.5 g/dL (ref 12.0–16.0)

## 2016-04-05 LAB — HEPARIN LEVEL (UNFRACTIONATED): Heparin Unfractionated: 1.09 IU/mL — ABNORMAL HIGH (ref 0.30–0.70)

## 2016-04-05 MED ORDER — METOLAZONE 5 MG PO TABS
5.0000 mg | ORAL_TABLET | Freq: Once | ORAL | Status: AC
  Administered 2016-04-05: 5 mg via ORAL
  Filled 2016-04-05: qty 1

## 2016-04-05 MED ORDER — ISOSORB DINITRATE-HYDRALAZINE 20-37.5 MG PO TABS
1.0000 | ORAL_TABLET | Freq: Three times a day (TID) | ORAL | Status: DC
  Administered 2016-04-05 (×3): 1 via ORAL
  Filled 2016-04-05 (×3): qty 1

## 2016-04-05 MED ORDER — POTASSIUM CHLORIDE CRYS ER 20 MEQ PO TBCR
40.0000 meq | EXTENDED_RELEASE_TABLET | Freq: Once | ORAL | Status: AC
  Administered 2016-04-05: 40 meq via ORAL
  Filled 2016-04-05: qty 2

## 2016-04-05 NOTE — Progress Notes (Signed)
ANTICOAGULATION CONSULT NOTE - Follow Up Consult  Pharmacy Consult for Heparin Indication: atrial fibrillation  No Known Allergies  Patient Measurements: Height: 5\' 10"  (177.8 cm) Weight: 225 lb 8.5 oz (102.3 kg) IBW/kg (Calculated) : 73 Heparin Dosing Weight: 96kg  Vital Signs: Temp: 98.2 F (36.8 C) (01/24 1100) Temp Source: Oral (01/24 1100) BP: 133/104 (01/24 1200) Pulse Rate: 60 (01/24 1200)  Labs:  Recent Labs  04/03/16 0524  04/04/16 0334 04/05/16 0451 04/05/16 1254  HGB 15.9  --  14.8 14.1  --   HCT 47.1  --  44.7 41.8  --   PLT 307  --  277 258  --   APTT  --   < > 94* 116* 77*  HEPARINUNFRC 2.20*  --  1.16* 1.09*  --   CREATININE 4.34*  --  3.90* 3.46*  --   < > = values in this interval not displayed.  Estimated Creatinine Clearance: 32.3 mL/min (by C-G formula based on SCr of 3.46 mg/dL (H)).   Medications:  Heparin @ 1500 units/hr  Assessment: 45yom transitioned from apixaban to heparin 1/21 for afib given acute renal failure and need for possible CRRT.Last apixaban dose 1/21 @ 0940. Using aPTTs to guide initial heparin dosing as apixaban falsely elevates heparin levels.    APTT was high this morning and rate decreased. Follow up aPTT is therapeutic at 77 seconds. CBC stable. No bleeding  Goal of Therapy:  Heparin level 0.3-0.7 aPTT 66-102 seconds Monitor platelets by anticoagulation protocol: Yes   Plan:  1) Continue heparin at 1500 units/hr 2) Daily aPTT, heparin level, and CBC   Deboraha Sprang 04/05/2016,1:30 PM

## 2016-04-05 NOTE — Progress Notes (Signed)
ANTICOAGULATION CONSULT NOTE - Follow Up Consult  Pharmacy Consult for Heparin Indication: atrial fibrillation  No Known Allergies  Patient Measurements: Height: 5\' 10"  (177.8 cm) Weight: 225 lb 8.5 oz (102.3 kg) IBW/kg (Calculated) : 73 Heparin Dosing Weight: 96kg  Vital Signs: BP: 136/107 (01/24 0300) Pulse Rate: 93 (01/24 0300)  Labs:  Recent Labs  04/03/16 0524  04/03/16 2120 04/04/16 0334 04/05/16 0451  HGB 15.9  --   --  14.8 14.1  HCT 47.1  --   --  44.7 41.8  PLT 307  --   --  277 258  APTT  --   < > 95* 94* 116*  HEPARINUNFRC 2.20*  --   --  1.16* 1.09*  CREATININE 4.34*  --   --  3.90* 3.46*  < > = values in this interval not displayed.  Estimated Creatinine Clearance: 32.3 mL/min (by C-G formula based on SCr of 3.46 mg/dL (H)).   Medications:  Heparin @ 1650 units/hr  Assessment: 45yom transitioned from apixaban to heparin 1/21 for afib given acute renal failure and need for possible CRRT.Last apixaban dose 1/21 @ 0940. Using aPTTs to guide initial heparin dosing as apixaban falsely elevates heparin levels.    APTT is supratherapeutic at 116 seconds. Heparin level remains elevated at 1.09 but trending down. CBC stable. No bleeding.   Goal of Therapy:  Heparin level 0.3-0.7 units/ml aPTT 66-102 seconds Monitor platelets by anticoagulation protocol: Yes   Plan:  1) Decrease heparin to 1500 units/hr 2) F/u 6 hr PTT  Sherlon Handing, PharmD, BCPS Clinical pharmacist, pager [phone] 04/05/2016,6:12 AM

## 2016-04-05 NOTE — Progress Notes (Signed)
Spoke with the diabetes coordinator, patient will most likely be discharged with insulin, was asked to provide patient with more education and teach patient how to give self injections of insulin. I spoke with the patient at the bedside and patient states, "I know how to give myself insulin I have done it before". I tried to gain clarification but patient was vague and couldn't tell me when he was on insulin or for how long. I asked the patient to show me how he gives insulin. I recommended that patient give his shots in his stomach however patient would prefer to give shots in his thigh. After discussing and providing education patient was able to provide teach back. Patient had concerns and stated, "I hate giving myself shots". Patient would benefit from continuing education on diabetes management and self injections.  Rowe Pavy, RN

## 2016-04-05 NOTE — Progress Notes (Signed)
[facility] Rounding Note Subjective/Interval History:   More interactive today Currently denies SOB Great UOP yesterday (>3 liters) Creatinine slow decline Off norepi  Objective  Intake/Output Summary (Last 24 hours) at 04/05/16 0840 Last data filed at 04/05/16 2130  Gross per 24 hour  Intake          1430.97 ml  Output             3176 ml  Net         -1745.03 ml   Physical Exam:  Blood pressure (!) 136/107, pulse 93, temperature 97.8 F (36.6 C), temperature source Oral, resp. rate 20, height 5\' 10"  (1.778 m), weight 102.3 kg (225 lb 8.5 oz), SpO2 96 %.  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  Post crackles Heart irregularly irreg  S1/S2  +S3  No murmur.    Abd soft, nontender, mild distention unchanged.  Awakens, alert, extremely flat affect. No LE edema  Labs:  Recent Labs Lab 03/30/16 0533 03/31/16 8657 04/01/16 8469 04/02/16 0234 04/03/16 0524 04/04/16 0334 04/05/16 0451  NA 136 136 135 132* 131* 129* 132*  K 3.7 3.5 4.5 4.3 4.4 3.6 3.5  CL 97* 97* 95* 95* 90* 89* 89*  CO2 30 31 30 25 28 28 30   GLUCOSE 295* 188* 226* 182* 275* 268* 177*  BUN 20 26* 35* 46* 51* 58* 60*  CREATININE 1.76* 2.13* 2.73* 4.48* 4.34* 3.90* 3.46*  CALCIUM 9.0 9.4 9.3 8.9 9.1 9.0 9.3    Recent Labs Lab 04/01/16 0634 04/03/16 0524 04/04/16 0334 04/05/16 0451  WBC 5.3 9.6 6.1 5.3  HGB 14.4 15.9 14.8 14.1  HCT 44.8 47.1 44.7 41.8  MCV 92.6 90.8 89.9 89.7  PLT 283 307 277 258    Recent Labs Lab 04/04/16 0820 04/04/16 1248 04/04/16 1627 04/04/16 2103 04/05/16 0830  GLUCAP 110* 149* 155* 137* 91    Medications: . amiodarone 30 mg/hr (04/05/16 0713)  . DOBUTamine 2.5 mcg/kg/min (04/05/16 0820)  . heparin 1,500 Units/hr (04/05/16 6295)  . norepinephrine (LEVOPHED) Adult infusion Stopped (04/04/16 0335)   . furosemide  160 mg Intravenous BID  . insulin aspart   0-20 Units Subcutaneous TID WC  . insulin aspart  6 Units Subcutaneous TID WC  . insulin glargine  25 Units Subcutaneous QHS  . isosorbide-hydrALAZINE  1 tablet Oral TID  . magnesium oxide  400 mg Oral Daily    Background:  46 yo AAM with NICM with EF 7% by nuclear study 03/29/16 (20% by ECHO) and mod to severely reduced RV function, HTN, HLD, uncontrolled DM, and CKD Stage III at baseline. Presented to ED 1/16 for exertional dyspnea/CHF exacerbation. Noted to have an AICD shock for polymorphic VT. On admission ->aflutter with mild RVR ->TEE and ablation 1/19 (w recurrence of AFF).  Renal consulted 04/02/16 for increase in creatinine from 1.7 to 4.5 with oliguria. Marked volume overload, intolerant of milronone 2/2 hypotension, and on high dose lasix and dobutamine. Baseline creatinine appears to be 1.4-1.5 generally  Assessment/Recommendations  1. AKI superimposed on CKD Stage III  2/2 cardiorenal syndrome (decompensated biventricular CHF with severely reduced EF - 7% by Cardiolyte/20% by echo).  Current lasix 160 BID/dobutamine at 2.5 and still diuresing.  Creatinine continues to improve slowly even in face of diuresis. He is NOT an outpatient dialysis candidate given severely reduced EF. For future reference, if he redevelops progressive AKI on  CKD would have to think long and hard as to whether CRRT would even be appropriate. Fortunately have not had to consider this trip  2. Acute systolic CHF exacerbation: remains on dobutamine and lasix, managed by Dr. Aundra Dubin 3. Afib/ flutter: s/p ablation 1/19, back in AF - on amio drip; EP following 4. VT- s/p AICD shock 03/26/16 5. DM II : Poorly controlled. Hgb A1c quite elevated 13.6. Unclear who manages. Only outpt med has been metformin.   At this time I am not adding anything to his care. My thoughts are as noted in #1 above. Renal will sign off at this time. Please call me if further questions come up.  Jamal Maes, MD [facility] [phone] pager 04/05/2016, 8:40 AM

## 2016-04-05 NOTE — Progress Notes (Signed)
Patient ID: Travis Bryan, male   DOB: 27-Feb-1971, 46 y.o.   MRN: [id number]   SUBJECTIVE:   Admitted with acute/chronic systolic CHF and atrial flutter. Underwent TEE and AFL ablation 1/19.  Post-procedure developed AKI and cardiogenic shock, started on dobutamine and norepinephrine.  He subsequently went into atrial flutter with controlled rate on amiodarone.   Off norepinephrine now.  BP high last night, dobutamine was on at 5 but was stopped this morning.  Currently, SBP 110s-120s. Co-ox 61%.   CVP 17 this morning despite good diuresis yesterday.  Denies dyspnea, walked with cardiac rehab.  Creatinine lower.  EF 15-20% with moderate to severe RV HK  Cardiolite: EF 7%, inferolateral fixed defect no ischemia.   Scheduled Meds: . furosemide  160 mg Intravenous BID  . insulin aspart  0-20 Units Subcutaneous TID WC  . insulin aspart  6 Units Subcutaneous TID WC  . insulin glargine  25 Units Subcutaneous QHS  . isosorbide-hydrALAZINE  1 tablet Oral TID  . magnesium oxide  400 mg Oral Daily  . metolazone  5 mg Oral Once  . potassium chloride  40 mEq Oral Once   Continuous Infusions: . amiodarone 30 mg/hr (04/05/16 0713)  . DOBUTamine Stopped (04/05/16 0358)  . heparin 1,500 Units/hr (04/05/16 7824)  . norepinephrine (LEVOPHED) Adult infusion Stopped (04/04/16 0335)   PRN Meds:.acetaminophen, ondansetron (ZOFRAN) IV, oxyCODONE-acetaminophen, sodium chloride flush    Vitals:   04/05/16 0100 04/05/16 0200 04/05/16 0300 04/05/16 0432  BP: 109/82 102/75 (!) 136/107   Pulse: 75 (!) 45 93   Resp: 17 (!) 21 20   Temp:      TempSrc:      SpO2: 97% 96% 96%   Weight:    225 lb 8.5 oz (102.3 kg)  Height:        Intake/Output Summary (Last 24 hours) at 04/05/16 0736 Last data filed at 04/05/16 2353  Gross per 24 hour  Intake          1430.97 ml  Output             3526 ml  Net         -2095.03 ml    LABS: Basic Metabolic Panel:  Recent Labs  04/04/16 0334 04/05/16 0451  NA  129* 132*  K 3.6 3.5  CL 89* 89*  CO2 28 30  GLUCOSE 268* 177*  BUN 58* 60*  CREATININE 3.90* 3.46*  CALCIUM 9.0 9.3  MG 2.4  --    Liver Function Tests: No results for input(s): AST, ALT, ALKPHOS, BILITOT, PROT, ALBUMIN in the last 72 hours. No results for input(s): LIPASE, AMYLASE in the last 72 hours. CBC:  Recent Labs  04/04/16 0334 04/05/16 0451  WBC 6.1 5.3  HGB 14.8 14.1  HCT 44.7 41.8  MCV 89.9 89.7  PLT 277 258   Cardiac Enzymes: No results for input(s): CKTOTAL, CKMB, CKMBINDEX, TROPONINI in the last 72 hours. BNP: Invalid input(s): POCBNP D-Dimer: No results for input(s): DDIMER in the last 72 hours. Hemoglobin A1C: No results for input(s): HGBA1C in the last 72 hours. Fasting Lipid Panel: No results for input(s): CHOL, HDL, LDLCALC, TRIG, CHOLHDL, LDLDIRECT in the last 72 hours. Thyroid Function Tests: No results for input(s): TSH, T4TOTAL, T3FREE, THYROIDAB in the last 72 hours.  Invalid input(s): FREET3 Anemia Panel: No results for input(s): VITAMINB12, FOLATE, FERRITIN, TIBC, IRON, RETICCTPCT in the last 72 hours.  RADIOLOGY: Dg Chest 2 View  Result Date: 03/28/2016 CLINICAL DATA:  Shortness of  breath EXAM: CHEST  2 VIEW COMPARISON:  04/25/2007 FINDINGS: Left-sided single lead pacing device with lead over right ventricle. Moderate cardiomegaly without overt failure. No acute infiltrate, consolidation, or pleural effusion. No pneumothorax peer IMPRESSION: Cardiomegaly without overt failure.  No acute infiltrate Electronically Signed   By: Donavan Foil M.D.   On: 03/28/2016 01:55   Nm Myocar Multi W/spect W/wall Motion / Ef  Result Date: 03/29/2016 CLINICAL DATA:  History of cardiomyopathy and atrial flutter. Acute on chronic systolic CHF. EXAM: MYOCARDIAL IMAGING WITH SPECT (REST AND PHARMACOLOGIC-STRESS) GATED LEFT VENTRICULAR WALL MOTION STUDY LEFT VENTRICULAR EJECTION FRACTION TECHNIQUE: Standard myocardial SPECT imaging was performed after resting  intravenous injection of 10 mCi Tc-27m tetrofosmin. Subsequently, intravenous infusion of Lexiscan was performed under the supervision of the Cardiology staff. At peak effect of the drug, 30 mCi Tc-67m tetrofosmin was injected intravenously and standard myocardial SPECT imaging was performed. Quantitative gated imaging was also performed to evaluate left ventricular wall motion, and estimate left ventricular ejection fraction. COMPARISON:  None. FINDINGS: Perfusion: There is a large fixed defect involving the inferolateral wall compatible with an infarct. There is no evidence to suggest reversibility or ischemia. Left ventricle is dilated. Wall Motion: There is markedly abnormal wall motion throughout the left ventricle. Severe hypokinesia throughout the left ventricle and difficult to exclude areas of paradoxical motion due to the minimal wall motion. Left Ventricular Ejection Fraction: 7 % End diastolic volume 622 ml End systolic volume 633 ml IMPRESSION: 1. Large infarct involving the inferolateral wall. No evidence for ischemia or reversibility. 2. Markedly abnormal wall motion throughout the left ventricle. Severe hypokinesia throughout the left ventricle. 3. Left ventricular ejection fraction is 7%. 4. Non invasive risk stratification*: High *2012 Appropriate Use Criteria for Coronary Revascularization Focused Update: J Am Coll Cardiol. 3545;62(5):638-937. http://content.[email or web address]?[email or web address] Electronically Signed   By: Markus Daft M.D.   On: 03/29/2016 13:52   Dg Chest Port 1 View  Result Date: 04/01/2016 CLINICAL DATA:  Acute on chronic systolic congestive heart failure. Nonischemic cardiomyopathy. EXAM: PORTABLE CHEST 1 VIEW COMPARISON:  03/28/2016 FINDINGS: Stable mild cardiomegaly. AICD remains in stable position with tip overlying the left ventricle. No pneumothorax visualized. Mild opacity in the left costophrenic angle may be due to atelectasis or small pleural effusion. Lung  fields are otherwise clear. IMPRESSION: New opacity in left costophrenic angle, which may be due to atelectasis or pleural effusion. Electronically Signed   By: Earle Gell M.D.   On: 04/01/2016 20:57    PHYSICAL EXAM CVP 17 General: NAD.  HEENT: poor dentition Neck: JVP 12. No thyromegaly or thyroid nodule.  Lungs: Decreased in the bases.  CV: Lateral PMI.  Heart irregular S1/S2, +prominent s3 no murmur.  No edema. Extremities warm.  Abdomen: Soft, nontender, no hepatosplenomegaly, no distention.  Neurologic: Alert and oriented x 3.  Psych: Flat affect. Extremities: No clubbing or cyanosis.   TELEMETRY:  Atrial fibrillation 80s   ASSESSMENT AND PLAN: 46 yo with long history of nonischemic cardiomyopathy, HTN, DM, and CKD stage III presented with exertional dyspnea/CHF exacerbation and was noted to have had ICD discharge on 1/14 for polymorphic VT.  1. Atrial flutter with mild RVR => atrial fibrillation: Newly noted, was in atrial flutter at admission.  Now s/p AFL ablation 1/19.  Currently in atrial fibrillation.  - Continue amiodarone gtt, will plan DCCV when inotrope titrated off.   He has been continuously anticoagulated.  - Continue heparin with AKI.  2. Acute on chronic systolic CHF -> cardiogenic  shock: EF 04/02/2016  EF 20% on echo with moderate to severely decreased RV systolic function.  Possibly hypertensive cardiomyopathy.  Had cath around time of diagnosis in 2006 without significant coronary disease.  Cardiolite 1/17 showed EF 7% with inferolateral fixed defect, no ischemia (possible artifact).  TEE with severe biventricular dysfunction.  He developed cardiogenic shock with AKI after flutter ablation. Norepinephrine now off.  Dobutamine was on at 5 yesterday, stopped this morning with elevated BP.  Co-ox 61%, CVP 17.  He needs further diuresis.  - Start dobutamine back at 2.5 mcg/kg/min.  - Continue Lasix 160 mg IV bid and add metolazone 5 mg x 1 again today.  - Increase Bidil  to 1 tab tid.  3. Acute on CKD: Stage IV.  Creatinine better. Likely low output cardio-renal -> ATN. See above 4. DM: Poor control => hgbA1c 13.6.  He does not know who manages this at home (?no PCP).  Has just been taking metformin.  On insulin in hospital, adjusting with help of diabetes nurse coordinator.  Need care management assistance for home health and to see if we can find him a PCP.   5. VT: Polymorphic VT on 1/14 with ICD discharge, VT on 01/11/16 with ICD discharge.  Suspect related to CHF exacerbation rather than ischemia. On Amio drip with A fib RVR. - Keep K> 4 and mag > 2.  Replace K today.   Loralie Champagne 04/05/2016 7:36 AM

## 2016-04-05 NOTE — Progress Notes (Signed)
CARDIAC REHAB PHASE I   PRE:  Rate/Rhythm: 79 afib    BP: sitting 123/87    SaO2: 96 2L  MODE:  Ambulation: 390 ft   POST:  Rate/Rhythm: 141 afib    BP: sitting 122/83     SaO2: 89-91 RA  Pt c/o left top of foot hurting, like gout. Sts this has been ongoing for a few days. Less limping with distance. Pt denied SOB however HR elevated with more distance. To recliner. Left off O2 and notified RN. Pt sts he has not read HF booklet. Will f/u. Peyton, ACSM 04/05/2016 12:03 PM

## 2016-04-05 NOTE — Progress Notes (Signed)
Spoke with Dr. regarding concerns with elevated blood pressure.  Instructed to turn off gtt, and to titrate according to pressure.  Gtt was turned off and monitoring continues.

## 2016-04-06 DIAGNOSIS — I4891 Unspecified atrial fibrillation: Secondary | ICD-10-CM

## 2016-04-06 LAB — CBC
HEMATOCRIT: 41.4 % (ref 39.0–52.0)
Hemoglobin: 13.9 g/dL (ref 13.0–17.0)
MCH: 30 pg (ref 26.0–34.0)
MCHC: 33.6 g/dL (ref 30.0–36.0)
MCV: 89.4 fL (ref 78.0–100.0)
Platelets: 283 10*3/uL (ref 150–400)
RBC: 4.63 MIL/uL (ref 4.22–5.81)
RDW: 13.6 % (ref 11.5–15.5)
WBC: 5 10*3/uL (ref 4.0–10.5)

## 2016-04-06 LAB — HEPARIN LEVEL (UNFRACTIONATED): Heparin Unfractionated: 0.8 IU/mL — ABNORMAL HIGH (ref 0.30–0.70)

## 2016-04-06 LAB — BASIC METABOLIC PANEL
Anion gap: 14 (ref 5–15)
BUN: 64 mg/dL — ABNORMAL HIGH (ref 6–20)
CO2: 29 mmol/L (ref 22–32)
Calcium: 9.4 mg/dL (ref 8.9–10.3)
Chloride: 90 mmol/L — ABNORMAL LOW (ref 101–111)
Creatinine, Ser: 3.47 mg/dL — ABNORMAL HIGH (ref 0.61–1.24)
GFR, EST AFRICAN AMERICAN: 23 mL/min — AB (ref >60)
GFR, EST NON AFRICAN AMERICAN: 20 mL/min — AB (ref >60)
GLUCOSE: 142 mg/dL — AB (ref 65–99)
POTASSIUM: 3.6 mmol/L (ref 3.5–5.1)
Sodium: 133 mmol/L — ABNORMAL LOW (ref 135–145)

## 2016-04-06 LAB — COOXEMETRY PANEL
CARBOXYHEMOGLOBIN: 1.6 % — AB (ref 0.5–1.5)
METHEMOGLOBIN: 0.6 % (ref 0.0–1.5)
O2 Saturation: 75.2 %
TOTAL HEMOGLOBIN: 14.4 g/dL (ref 12.0–16.0)

## 2016-04-06 LAB — GLUCOSE, CAPILLARY
GLUCOSE-CAPILLARY: 129 mg/dL — AB (ref 65–99)
Glucose-Capillary: 91 mg/dL (ref 65–99)
Glucose-Capillary: 154 mg/dL — ABNORMAL HIGH (ref 65–99)
Glucose-Capillary: 151 mg/dL — ABNORMAL HIGH (ref 65–99)

## 2016-04-06 LAB — APTT: aPTT: 87 seconds — ABNORMAL HIGH (ref 24–36)

## 2016-04-06 MED ORDER — ISOSORB DINITRATE-HYDRALAZINE 20-37.5 MG PO TABS
2.0000 | ORAL_TABLET | Freq: Three times a day (TID) | ORAL | Status: DC
  Administered 2016-04-06 – 2016-04-14 (×23): 2 via ORAL
  Filled 2016-04-06 (×25): qty 2

## 2016-04-06 MED ORDER — METOLAZONE 5 MG PO TABS
5.0000 mg | ORAL_TABLET | Freq: Once | ORAL | Status: AC
  Administered 2016-04-06: 5 mg via ORAL
  Filled 2016-04-06: qty 1

## 2016-04-06 MED ORDER — POTASSIUM CHLORIDE CRYS ER 20 MEQ PO TBCR
40.0000 meq | EXTENDED_RELEASE_TABLET | Freq: Once | ORAL | Status: AC
  Administered 2016-04-06: 40 meq via ORAL
  Filled 2016-04-06: qty 2

## 2016-04-06 NOTE — Progress Notes (Signed)
ANTICOAGULATION CONSULT NOTE - Follow Up Consult  Pharmacy Consult for Heparin Indication: atrial fibrillation  No Known Allergies  Patient Measurements: Height: 5\' 10"  (177.8 cm) Weight: 224 lb 6.9 oz (101.8 kg) IBW/kg (Calculated) : 73 Heparin Dosing Weight: 96kg  Vital Signs: Temp: 98.1 F (36.7 C) (01/25 0700) Temp Source: Oral (01/25 0700) BP: 135/125 (01/25 0400) Pulse Rate: 88 (01/25 0400)  Labs:  Recent Labs  04/04/16 0334 04/05/16 0451 04/05/16 1254 04/06/16 0500  HGB 14.8 14.1  --  13.9  HCT 44.7 41.8  --  41.4  PLT 277 258  --  283  APTT 94* 116* 77* 87*  HEPARINUNFRC 1.16* 1.09*  --  0.80*  CREATININE 3.90* 3.46*  --  3.47*    Estimated Creatinine Clearance: 32.1 mL/min (by C-G formula based on SCr of 3.47 mg/dL (H)).   Medications:  Heparin @ 1500 units/hr  Assessment: 45yom transitioned from apixaban to heparin 1/21 for afib given acute renal failure and need for possible CRRT.Last apixaban dose 1/21 @ 0940. Using aPTTs to guide initial heparin dosing as apixaban falsely elevates heparin levels.    APTT therapeutic at 87 seconds. Heparin level remains elevated but trending down to 0.80. APTT and heparin level starting to correlate. CBC stable. No bleeding. Noted plan for DCCV tomorrow. Renal function slowly improving with high dose lasix and dobutamine - will likely avoid CRRT this admit per renal, not outpatient dialysis candidate either.   Goal of Therapy:  Heparin level 0.3-0.7 aPTT 66-102 seconds Monitor platelets by anticoagulation protocol: Yes   Plan:  1) Continue heparin at 1500 units/hr 2) Daily aPTT, heparin level, and CBC - may be able to switch to just heparin levels after tomorrow   Deboraha Sprang 04/06/2016,8:29 AM

## 2016-04-06 NOTE — Progress Notes (Signed)
Patient ID: Travis Bryan, male   DOB: 1970/08/10, 46 y.o.   MRN: [id number]   SUBJECTIVE:   Admitted with acute/chronic systolic CHF and atrial flutter. Underwent TEE and AFL ablation 1/19.  Post-procedure developed AKI and cardiogenic shock, started on dobutamine and norepinephrine.  He subsequently went into atrial flutter with controlled rate on amiodarone.   Yesterday dobutamine restarted at 2.5 mcg. Todays CO-OX 75%. Continues to diurese with high dose IV lasix. Weight down 1 pound.  Creatinine unchanged 3.4. Ambulated 390 feet.   Denies SOB/CP.   EF 15-20% with moderate to severe RV HK  Cardiolite: EF 7%, inferolateral fixed defect no ischemia.   Scheduled Meds: . furosemide  160 mg Intravenous BID  . insulin aspart  0-20 Units Subcutaneous TID WC  . insulin aspart  6 Units Subcutaneous TID WC  . insulin glargine  25 Units Subcutaneous QHS  . isosorbide-hydrALAZINE  1 tablet Oral TID  . magnesium oxide  400 mg Oral Daily   Continuous Infusions: . amiodarone 30 mg/hr (04/06/16 0537)  . DOBUTamine 2.5 mcg/kg/min (04/06/16 0000)  . heparin 1,500 Units/hr (04/06/16 0537)  . norepinephrine (LEVOPHED) Adult infusion Stopped (04/04/16 0335)   PRN Meds:.acetaminophen, ondansetron (ZOFRAN) IV, oxyCODONE-acetaminophen, sodium chloride flush    Vitals:   04/06/16 0000 04/06/16 0200 04/06/16 0300 04/06/16 0400  BP: 110/72 116/79 120/89 (!) 135/125  Pulse: (!) 41 (!) 59 (!) 57 88  Resp: (!) 21 16 17 20   Temp: 98.4 F (36.9 C)   98.1 F (36.7 C)  TempSrc: Oral   Oral  SpO2: 98% 98% 95% 97%  Weight:    224 lb 6.9 oz (101.8 kg)  Height:        Intake/Output Summary (Last 24 hours) at 04/06/16 0716 Last data filed at 04/06/16 0000  Gross per 24 hour  Intake          1253.71 ml  Output             2225 ml  Net          -971.29 ml    LABS: Basic Metabolic Panel:  Recent Labs  04/04/16 0334 04/05/16 0451 04/06/16 0500  NA 129* 132* 133*  K 3.6 3.5 3.6  CL 89* 89*  90*  CO2 28 30 29   GLUCOSE 268* 177* 142*  BUN 58* 60* 64*  CREATININE 3.90* 3.46* 3.47*  CALCIUM 9.0 9.3 9.4  MG 2.4  --   --    Liver Function Tests: No results for input(s): AST, ALT, ALKPHOS, BILITOT, PROT, ALBUMIN in the last 72 hours. No results for input(s): LIPASE, AMYLASE in the last 72 hours. CBC:  Recent Labs  04/05/16 0451 04/06/16 0500  WBC 5.3 5.0  HGB 14.1 13.9  HCT 41.8 41.4  MCV 89.7 89.4  PLT 258 283   Cardiac Enzymes: No results for input(s): CKTOTAL, CKMB, CKMBINDEX, TROPONINI in the last 72 hours. BNP: Invalid input(s): POCBNP D-Dimer: No results for input(s): DDIMER in the last 72 hours. Hemoglobin A1C: No results for input(s): HGBA1C in the last 72 hours. Fasting Lipid Panel: No results for input(s): CHOL, HDL, LDLCALC, TRIG, CHOLHDL, LDLDIRECT in the last 72 hours. Thyroid Function Tests: No results for input(s): TSH, T4TOTAL, T3FREE, THYROIDAB in the last 72 hours.  Invalid input(s): FREET3 Anemia Panel: No results for input(s): VITAMINB12, FOLATE, FERRITIN, TIBC, IRON, RETICCTPCT in the last 72 hours.  RADIOLOGY: Dg Chest 2 View  Result Date: 03/28/2016 CLINICAL DATA:  Shortness of breath EXAM: CHEST  2  VIEW COMPARISON:  04/25/2007 FINDINGS: Left-sided single lead pacing device with lead over right ventricle. Moderate cardiomegaly without overt failure. No acute infiltrate, consolidation, or pleural effusion. No pneumothorax peer IMPRESSION: Cardiomegaly without overt failure.  No acute infiltrate Electronically Signed   By: Donavan Foil M.D.   On: 03/28/2016 01:55   Nm Myocar Multi W/spect W/wall Motion / Ef  Result Date: 03/29/2016 CLINICAL DATA:  History of cardiomyopathy and atrial flutter. Acute on chronic systolic CHF. EXAM: MYOCARDIAL IMAGING WITH SPECT (REST AND PHARMACOLOGIC-STRESS) GATED LEFT VENTRICULAR WALL MOTION STUDY LEFT VENTRICULAR EJECTION FRACTION TECHNIQUE: Standard myocardial SPECT imaging was performed after resting  intravenous injection of 10 mCi Tc-43m tetrofosmin. Subsequently, intravenous infusion of Lexiscan was performed under the supervision of the Cardiology staff. At peak effect of the drug, 30 mCi Tc-54m tetrofosmin was injected intravenously and standard myocardial SPECT imaging was performed. Quantitative gated imaging was also performed to evaluate left ventricular wall motion, and estimate left ventricular ejection fraction. COMPARISON:  None. FINDINGS: Perfusion: There is a large fixed defect involving the inferolateral wall compatible with an infarct. There is no evidence to suggest reversibility or ischemia. Left ventricle is dilated. Wall Motion: There is markedly abnormal wall motion throughout the left ventricle. Severe hypokinesia throughout the left ventricle and difficult to exclude areas of paradoxical motion due to the minimal wall motion. Left Ventricular Ejection Fraction: 7 % End diastolic volume 025 ml End systolic volume 427 ml IMPRESSION: 1. Large infarct involving the inferolateral wall. No evidence for ischemia or reversibility. 2. Markedly abnormal wall motion throughout the left ventricle. Severe hypokinesia throughout the left ventricle. 3. Left ventricular ejection fraction is 7%. 4. Non invasive risk stratification*: High *2012 Appropriate Use Criteria for Coronary Revascularization Focused Update: J Am Coll Cardiol. 0623;76(2):831-517. http://[email or web address]?[email or web address] Electronically Signed   By: Markus Daft M.D.   On: 03/29/2016 13:52   Dg Chest Port 1 View  Result Date: 04/01/2016 CLINICAL DATA:  Acute on chronic systolic congestive heart failure. Nonischemic cardiomyopathy. EXAM: PORTABLE CHEST 1 VIEW COMPARISON:  03/28/2016 FINDINGS: Stable mild cardiomegaly. AICD remains in stable position with tip overlying the left ventricle. No pneumothorax visualized. Mild opacity in the left costophrenic angle may be due to atelectasis or small pleural effusion. Lung  fields are otherwise clear. IMPRESSION: New opacity in left costophrenic angle, which may be due to atelectasis or pleural effusion. Electronically Signed   By: Earle Gell M.D.   On: 04/01/2016 20:57    PHYSICAL EXAM CVP 11 General: NAD. In bed  HEENT: poor dentition Neck: JVP 10-11. No thyromegaly or thyroid nodule.  Lungs: Decreased in the bases.  CV: Lateral PMI.  Heart irregular S1/S2, +prominent s3 no murmur.  No edema. Extremities warm.  Abdomen: Soft, nontender, no hepatosplenomegaly, no distention.  Neurologic: Alert and oriented x 3.  Psych: Flat affect. Extremities: No clubbing or cyanosis.   TELEMETRY:  Atrial fibrillation 70s   ASSESSMENT AND PLAN: 46 yo with long history of nonischemic cardiomyopathy, HTN, DM, and CKD stage III presented with exertional dyspnea/CHF exacerbation and was noted to have had ICD discharge on 1/14 for polymorphic VT.  1. Atrial flutter with mild RVR => atrial fibrillation: Newly noted, was in atrial flutter at admission.  Now s/p AFL ablation 1/19.  Currently in atrial fibrillation.  - Continue amiodarone gtt, will plan DCCV when inotrope titrated off.   He has been continuously anticoagulated.  - Continue heparin with AKI.  2. Acute on chronic systolic CHF -> cardiogenic shock: EF 04/02/2016  EF 20% on echo with moderate to severely decreased RV systolic function.  Possibly hypertensive cardiomyopathy.  Had cath around time of diagnosis in 2006 without significant coronary disease.  Cardiolite 1/17 showed EF 7% with inferolateral fixed defect, no ischemia (possible artifact).  TEE with severe biventricular dysfunction.  He developed cardiogenic shock with AKI after flutter ablation. Norepinephrine now off.   Todays CO-OX is 75%. CVP down to 10-11.   - Continue dobutamine at 2.5 mcg/kg/min.  - Continue Lasix 160 mg IV bid and  metolazone 5 mg x 1 today. Anticipate transitioning to po diuretics tomorrow.  - Increase Bidil to 2 tab tid.  3. Acute on  CKD: Stage IV.  Creatinine leveled off 3.4 Likely low output cardio-renal -> ATN. See above 4. DM: Poor control => hgbA1c 13.6.  He does not know who manages this at home (?no PCP).  Has just been taking metformin.  On insulin in hospital, adjusting with help of diabetes nurse coordinator.  Need care management assistance for home health and to see if we can find him a PCP.   5. VT: Polymorphic VT on 1/14 with ICD discharge, VT on 01/11/16 with ICD discharge.  Suspect related to CHF exacerbation rather than ischemia. On Amio drip with A fib RVR. - Keep K> 4 and mag > 2.  K 3.6 today. Give 40 meq potassium   Continue to mobilize.      Amy Clegg NP-C  04/06/2016 7:16 AM  Patient seen with NP, agree with the above note.  Still some volume overload.  Creatinine stable.  Good BP.   - Diurese today as above, probably to po tomorrow. - Continue dobutamine while diuresing, likely stop in am tomorrow. - Will plan DCCV tomorrow afternoon after dobutamine stopped.  - Increase Bidil.   Loralie Champagne 04/06/2016 7:42 AM

## 2016-04-06 NOTE — Progress Notes (Signed)
Patient transferred to 4N11. A/OX4. NAD. VSS.

## 2016-04-06 NOTE — Progress Notes (Signed)
Pt declined. Sts his feet hurt and that he just got meds for them. Sts he will walk with nursing at 1630. Will f/u tomorrow. Yves Dill CES, ACSM 3:32 PM 04/06/2016

## 2016-04-06 NOTE — Progress Notes (Signed)
Inpatient Diabetes Program Recommendations  AACE/ADA: New Consensus Statement on Inpatient Glycemic Control (2015)  Target Ranges:  Prepandial:   less than 140 mg/dL      Peak postprandial:   less than 180 mg/dL (1-2 hours)      Critically ill patients:  140 - 180 mg/dL   Lab Results  Component Value Date   GLUCAP 91 04/06/2016   HGBA1C 13.4 (H) 03/30/2016    Review of Glycemic Control:  Results for GERAD, CORNELIO (MRN [id number]) as of 04/06/2016 09:13  Ref. Range 04/05/2016 08:30 04/05/2016 11:09 04/05/2016 16:03 04/05/2016 21:45 04/06/2016 08:17  Glucose-Capillary Latest Ref Range: 65 - 99 mg/dL 91 180 (H) 143 (H) 146 (H) 91   Current orders for Inpatient glycemic control:  Lantus 25 units q HS, Novolog 6 units tid with meals, Novolog resistant tid with meals  Inpatient Diabetes Program Recommendations:    Blood sugars are within hospital goal.  Excellent teaching done by RN last PM. Note that patient states that he does not like injections.  May consider at discharge only ordering Lantus once daily (without Novolog multiple daily injections).  Based on BUN/Creatnine, patient will not be able to resume Metformin.   Will follow-up with patient and RN.    Thanks, Adah Perl, RN, BC-ADM Inpatient Diabetes Coordinator Pager [phone] (8a-5p)

## 2016-04-07 ENCOUNTER — Encounter (HOSPITAL_COMMUNITY)
Admission: EM | Disposition: A | Discharge status: Home / Self Care | Payer: Self-pay | Source: Home / Self Care | Attending: Cardiovascular Disease

## 2016-04-07 ENCOUNTER — Inpatient Hospital Stay (HOSPITAL_COMMUNITY): Payer: BLUE CROSS/BLUE SHIELD | Admitting: Anesthesiology

## 2016-04-07 ENCOUNTER — Encounter (HOSPITAL_COMMUNITY): Payer: Self-pay | Admitting: *Deleted

## 2016-04-07 DIAGNOSIS — I4819 Other persistent atrial fibrillation: Secondary | ICD-10-CM

## 2016-04-07 HISTORY — PX: CARDIOVERSION: SHX1299

## 2016-04-07 LAB — HEPARIN LEVEL (UNFRACTIONATED): Heparin Unfractionated: 0.63 IU/mL (ref 0.30–0.70)

## 2016-04-07 LAB — GLUCOSE, CAPILLARY
GLUCOSE-CAPILLARY: 114 mg/dL — AB (ref 65–99)
Glucose-Capillary: 90 mg/dL (ref 65–99)
Glucose-Capillary: 120 mg/dL — ABNORMAL HIGH (ref 65–99)
Glucose-Capillary: 186 mg/dL — ABNORMAL HIGH (ref 65–99)

## 2016-04-07 LAB — BASIC METABOLIC PANEL
Anion gap: 13 (ref 5–15)
BUN: 69 mg/dL — AB (ref 6–20)
CO2: 31 mmol/L (ref 22–32)
CREATININE: 3.62 mg/dL — AB (ref 0.61–1.24)
Calcium: 9.9 mg/dL (ref 8.9–10.3)
Chloride: 89 mmol/L — ABNORMAL LOW (ref 101–111)
GFR calc Af Amer: 22 mL/min — ABNORMAL LOW (ref >60)
GFR, EST NON AFRICAN AMERICAN: 19 mL/min — AB (ref >60)
GLUCOSE: 128 mg/dL — AB (ref 65–99)
Potassium: 4 mmol/L (ref 3.5–5.1)
Sodium: 133 mmol/L — ABNORMAL LOW (ref 135–145)

## 2016-04-07 LAB — CBC
HEMATOCRIT: 43 % (ref 39.0–52.0)
Hemoglobin: 14.1 g/dL (ref 13.0–17.0)
MCH: 29.4 pg (ref 26.0–34.0)
MCHC: 32.8 g/dL (ref 30.0–36.0)
MCV: 89.8 fL (ref 78.0–100.0)
PLATELETS: 270 10*3/uL (ref 150–400)
RBC: 4.79 MIL/uL (ref 4.22–5.81)
RDW: 13.6 % (ref 11.5–15.5)
WBC: 5.2 10*3/uL (ref 4.0–10.5)

## 2016-04-07 LAB — COOXEMETRY PANEL
Carboxyhemoglobin: 1 % (ref 0.5–1.5)
METHEMOGLOBIN: 0.8 % (ref 0.0–1.5)
O2 Saturation: 59.5 %
Total hemoglobin: 16.6 g/dL — ABNORMAL HIGH (ref 12.0–16.0)

## 2016-04-07 LAB — APTT: aPTT: 87 seconds — ABNORMAL HIGH (ref 24–36)

## 2016-04-07 SURGERY — CARDIOVERSION
Anesthesia: Monitor Anesthesia Care

## 2016-04-07 MED ORDER — PROPOFOL 10 MG/ML IV BOLUS
INTRAVENOUS | Status: DC | PRN
  Administered 2016-04-07: 70 mg via INTRAVENOUS

## 2016-04-07 MED ORDER — SODIUM CHLORIDE 0.9 % IV SOLN
INTRAVENOUS | Status: DC | PRN
  Administered 2016-04-07: 10:00:00 via INTRAVENOUS

## 2016-04-07 MED ORDER — ORAL CARE MOUTH RINSE
15.0000 mL | Freq: Two times a day (BID) | OROMUCOSAL | Status: DC
  Administered 2016-04-07 – 2016-04-11 (×3): 15 mL via OROMUCOSAL

## 2016-04-07 MED ORDER — WARFARIN - PHARMACIST DOSING INPATIENT
Freq: Every day | Status: DC
  Administered 2016-04-07 – 2016-04-13 (×7)

## 2016-04-07 MED ORDER — BIOTENE DRY MOUTH MT LIQD
15.0000 mL | Freq: Two times a day (BID) | OROMUCOSAL | Status: DC
  Administered 2016-04-07: 15 mL via OROMUCOSAL

## 2016-04-07 MED ORDER — WARFARIN SODIUM 7.5 MG PO TABS
7.5000 mg | ORAL_TABLET | Freq: Once | ORAL | Status: AC
  Administered 2016-04-07: 7.5 mg via ORAL
  Filled 2016-04-07: qty 1

## 2016-04-07 MED ORDER — LIDOCAINE HCL (CARDIAC) 20 MG/ML IV SOLN
INTRAVENOUS | Status: DC | PRN
  Administered 2016-04-07: 100 mg via INTRATRACHEAL

## 2016-04-07 MED ORDER — TORSEMIDE 20 MG PO TABS
60.0000 mg | ORAL_TABLET | Freq: Two times a day (BID) | ORAL | Status: DC
  Administered 2016-04-08: 60 mg via ORAL
  Filled 2016-04-07: qty 3

## 2016-04-07 NOTE — Progress Notes (Signed)
Inpatient Diabetes Program Recommendations  AACE/ADA: New Consensus Statement on Inpatient Glycemic Control (2015)  Target Ranges:  Prepandial:   less than 140 mg/dL      Peak postprandial:   less than 180 mg/dL (1-2 hours)      Critically ill patients:  140 - 180 mg/dL   Lab Results  Component Value Date   GLUCAP 120 (H) 04/07/2016   HGBA1C 13.4 (H) 03/30/2016    Review of Glycemic Control:  Results for TAWAN, DEGROOTE (MRN [id number]) as of 04/07/2016 15:01  Ref. Range 04/06/2016 11:48 04/06/2016 16:35 04/06/2016 21:00 04/07/2016 08:53 04/07/2016 12:46  Glucose-Capillary Latest Ref Range: 65 - 99 mg/dL 154 (H) 151 (H) 129 (H) 90 120 (H)   Current orders for Inpatient glycemic control:   Novolog resistant tid with meals, Lantus 25 units q HS, Novolog 6 units tid with meals  Inpatient Diabetes Program Recommendations:    Note that blood sugars well controlled.  May consider d/c of Novolog meal coverage and correction when patient is discharged home.  Spoke with patient regarding medications/prescription coverage.  He states that he has BCBS but does not know where the card is.  Briefly reviewed survival skills of diabetes including high and low blood sugars, treatment of low blood sugars, foot care, importance of f/u with MD and monitoring.  Patient states he knows how to check blood sugars however does not have a meter.  At discharge he will need prescription for meter (order #[id number]), Lantus insulin and syringes.  If he does have Rx. Coverage, he may prefer insulin pen however he states he has done vial and syringe before in the past.   Discussed with RN.    Thanks, Adah Perl, RN, BC-ADM Inpatient Diabetes Coordinator Pager [phone] (8a-5p)

## 2016-04-07 NOTE — Progress Notes (Addendum)
ANTICOAGULATION CONSULT NOTE - Follow Up Consult  Pharmacy Consult for Heparin Indication: atrial fibrillation  No Known Allergies  Patient Measurements: Height: 5\' 10"  (177.8 cm) Weight: 222 lb 6.4 oz (100.9 kg) IBW/kg (Calculated) : 73 Heparin Dosing Weight: 96kg  Vital Signs: Temp: 98.5 F (36.9 C) (01/26 0300) Temp Source: Oral (01/26 0300) BP: 125/91 (01/26 0300) Pulse Rate: 70 (01/26 0300)  Labs:  Recent Labs  04/05/16 0451 04/05/16 1254 04/06/16 0500 04/07/16 0510  HGB 14.1  --  13.9 14.1  HCT 41.8  --  41.4 43.0  PLT 258  --  283 270  APTT 116* 77* 87* 87*  HEPARINUNFRC 1.09*  --  0.80* 0.63  CREATININE 3.46*  --  3.47* 3.62*    Estimated Creatinine Clearance: 30.7 mL/min (by C-G formula based on SCr of 3.62 mg/dL (H)).   Medications:  Heparin @ 1500 units/hr  Assessment: 45yom transitioned from apixaban to heparin 1/21 for afib given acute renal failure and need for possible CRRT.Last apixaban dose 1/21 @ 0940. Using aPTTs to guide initial heparin dosing as apixaban falsely elevates heparin levels.    APTT therapeutic at 87 seconds. Heparin level down to 0.63. APTT and heparin level appear to correlate. CBC stable. No bleeding.  Noted plan for DCCV today. Renal function slowly improving with high dose lasix and dobutamine (turn off prior to dccv) - will likely avoid CRRT this admit per renal, not outpatient dialysis candidate either.   Given poor renal function will transition to coumadin tonight as opposed to going back on apixaban.   Goal of Therapy:  INR goal 2-3 Heparin level 0.3-0.7 aPTT 66-102 seconds Monitor platelets by anticoagulation protocol: Yes   Plan:  1) Continue heparin at 1500 units/hr 2) Daily heparin level, and CBC 3) Warfarin 7.5mg  tonight 4) Daily INR  Erin Hearing PharmD., BCPS Clinical Pharmacist Pager [phone] 04/07/2016 8:05 AM

## 2016-04-07 NOTE — Hospital Discharge Follow-Up (Addendum)
[facility] [place]:  This Case Manager received call from Elissa Hefty, RN CM who was inquiring if patient meets criteria for the [facility] at [name] and [facility]. Patient does not meet criteria for the Transitional Care program as this is his first admission in the last year. Would benefit from a hospital follow-up appointment at [place]; however, there are not any available appointments at this time. Will contact Elissa Hefty, RN CM if an appointment becomes available at a later time. Elissa Hefty, RN CM updated.  Addendum-This Case Manager placed call to Elissa Hefty, RN CM to inform her appointment available on 04/12/16 at [facility] and [facility]. She requested this Case Manager contact patient to discuss appointment. Call placed to patient in room. Provided information about [facility] [facility] and inquired if patient would like this Case Manager to schedule him an appointment. Patient indicated he did not want to schedule an appointment at this time. Elissa Hefty, RN CM updated. Will put clinic's contact information on AVS in case patient wants to schedule appointment after discharge.

## 2016-04-07 NOTE — CV Procedure (Signed)
    Cardioversion Note  Travis Bryan [id number] Jul 08, 1970  Procedure: DC Cardioversion Indications: Atrial fib  Procedure Details Consent: Obtained Time Out: Verified patient identification, verified procedure, site/side was marked, verified correct patient position, special equipment/implants available, Radiology Safety Procedures followed,  medications/allergies/relevent history reviewed, required imaging and test results available.  Performed  The patient has been on adequate anticoagulation.  The patient received Lidocaine 100 mg followed by Propopfol 80 mg  for sedation.  Synchronous cardioversion was performed at 120  joules.  The cardioversion was successful     Complications: No apparent complications Patient did tolerate procedure well.   Travis Bryan, Brooke Bonito., MD, [facility] 04/07/2016, 10:49 AM

## 2016-04-07 NOTE — Transfer of Care (Signed)
Immediate Anesthesia Transfer of Care Note  Patient: Travis Bryan  Procedure(s) Performed: Procedure(s): CARDIOVERSION (N/A)  Patient Location: Endoscopy Unit  Anesthesia Type:MAC  Level of Consciousness: awake, alert  and oriented  Airway & Oxygen Therapy: Patient Spontanous Breathing and Patient connected to nasal cannula oxygen  Post-op Assessment: Report given to RN and Post -op Vital signs reviewed and stable  Post vital signs: Reviewed and stable  Last Vitals:  Vitals:   04/07/16 0855 04/07/16 1026  BP: (!) 122/92 112/65  Pulse: 60 80  Resp: 18 20  Temp: 36.8 C 36.7 C    Last Pain:  Vitals:   04/07/16 1026  TempSrc: Oral  PainSc:       Patients Stated Pain Goal: 0 (23/36/12 2449)  Complications: No apparent anesthesia complications

## 2016-04-07 NOTE — Anesthesia Preprocedure Evaluation (Signed)
Anesthesia Evaluation  Patient identified by MRN, date of birth, ID band Patient awake    Reviewed: Allergy & Precautions, NPO status , Patient's Chart, lab work & pertinent test results  Airway Mallampati: II  TM Distance: >3 FB Neck ROM: Full    Dental no notable dental hx. (+) Poor Dentition, Missing, Loose   Pulmonary former smoker,    Pulmonary exam normal breath sounds clear to auscultation       Cardiovascular hypertension, Pt. on medications + CAD, + Past MI and +CHF  Normal cardiovascular exam+ dysrhythmias Atrial Fibrillation + Cardiac Defibrillator  Rhythm:Regular Rate:Normal     Neuro/Psych negative neurological ROS  negative psych ROS   GI/Hepatic negative GI ROS, Neg liver ROS,   Endo/Other  diabetes, Type 2  Renal/GU negative Renal ROS  negative genitourinary   Musculoskeletal negative musculoskeletal ROS (+)   Abdominal   Peds negative pediatric ROS (+)  Hematology negative hematology ROS (+)   Anesthesia Other Findings   Reproductive/Obstetrics negative OB ROS                             Anesthesia Physical Anesthesia Plan  ASA: IV  Anesthesia Plan: General   Post-op Pain Management:    Induction: Intravenous  Airway Management Planned: Mask  Additional Equipment:   Intra-op Plan:   Post-operative Plan:   Informed Consent: I have reviewed the patients History and Physical, chart, labs and discussed the procedure including the risks, benefits and alternatives for the proposed anesthesia with the patient or authorized representative who has indicated his/her understanding and acceptance.   Dental advisory given  Plan Discussed with: CRNA  Anesthesia Plan Comments:         Anesthesia Quick Evaluation

## 2016-04-07 NOTE — Care Management Note (Addendum)
Case Management Note  Patient Details  Name: Travis Bryan MRN: [id number] Date of Birth: 10/31/1970  Subjective/Objective:     Adm w chf, will go home on insulin for diabetes also               Action/Plan: lives w aunt and uncle. States he works and has [employer] ins. States doesn't have card with him and his aunt could not find his card. He feels like when he gets home he will be able to find his ins card.   Expected Discharge Date:                  Expected Discharge Plan:  Home/Self Care  In-House Referral:     Discharge planning Services  CM Consult, Medication Assistance, [facility]  Post Acute Care Choice:    Choice offered to:     DME Arranged:    DME Agency:     HH Arranged:    HH Agency:     Status of Service:  Completed, signed off  If discussed at [employer] of Stay Meetings, dates discussed:    Additional Comments: gave pt phone number to call to get lantus copay card where he would not have any copay for lantus. Since no ins listed gave pt [facility] list,inform on [facility] card and pt assist form for lantus w [employer]. Did give pt match letter in case cannot find ins card he will have a way to get meds til can get into orange card program.  Lacretia Leigh, RN 04/07/2016, 3:00 PM

## 2016-04-07 NOTE — Progress Notes (Signed)
Patient ID: Travis Bryan, male   DOB: March 26, 1970, 46 y.o.   MRN: [id number]   SUBJECTIVE:   Admitted with acute/chronic systolic CHF and atrial flutter. Underwent TEE and AFL ablation 1/19.  Post-procedure developed AKI and cardiogenic shock, started on dobutamine and norepinephrine.  He subsequently went into atrial flutter with controlled rate on amiodarone.   Yesterday he continued to diurese with IV lasix. CVP 6-7.  Todays CO-OX is 60%. Creatinine trending up 3.4>3.6 .   Denies SOB/CP.   EF 15-20% with moderate to severe RV HK  Cardiolite: EF 7%, inferolateral fixed defect no ischemia.   Scheduled Meds: . furosemide  160 mg Intravenous BID  . insulin aspart  0-20 Units Subcutaneous TID WC  . insulin aspart  6 Units Subcutaneous TID WC  . insulin glargine  25 Units Subcutaneous QHS  . isosorbide-hydrALAZINE  2 tablet Oral TID  . magnesium oxide  400 mg Oral Daily   Continuous Infusions: . amiodarone 30 mg/hr (04/07/16 0703)  . DOBUTamine 2.5 mcg/kg/min (04/06/16 1100)  . heparin 1,500 Units/hr (04/06/16 2240)  . norepinephrine (LEVOPHED) Adult infusion Stopped (04/04/16 0335)   PRN Meds:.acetaminophen, ondansetron (ZOFRAN) IV, oxyCODONE-acetaminophen, sodium chloride flush    Vitals:   04/06/16 2300 04/07/16 0000 04/07/16 0300 04/07/16 0500  BP: 100/68 119/85 (!) 125/91   Pulse: 62 (!) 55 70   Resp: 19 16 18    Temp: 97.8 F (36.6 C)  98.5 F (36.9 C)   TempSrc: Oral  Oral   SpO2: 98% 97% 96%   Weight:    222 lb 6.4 oz (100.9 kg)  Height:        Intake/Output Summary (Last 24 hours) at 04/07/16 0803 Last data filed at 04/07/16 0700  Gross per 24 hour  Intake           1232.4 ml  Output             1401 ml  Net           -168.6 ml    LABS: Basic Metabolic Panel:  Recent Labs  04/06/16 0500 04/07/16 0510  NA 133* 133*  K 3.6 4.0  CL 90* 89*  CO2 29 31  GLUCOSE 142* 128*  BUN 64* 69*  CREATININE 3.47* 3.62*  CALCIUM 9.4 9.9   Liver Function  Tests: No results for input(s): AST, ALT, ALKPHOS, BILITOT, PROT, ALBUMIN in the last 72 hours. No results for input(s): LIPASE, AMYLASE in the last 72 hours. CBC:  Recent Labs  04/06/16 0500 04/07/16 0510  WBC 5.0 5.2  HGB 13.9 14.1  HCT 41.4 43.0  MCV 89.4 89.8  PLT 283 270   Cardiac Enzymes: No results for input(s): CKTOTAL, CKMB, CKMBINDEX, TROPONINI in the last 72 hours. BNP: Invalid input(s): POCBNP D-Dimer: No results for input(s): DDIMER in the last 72 hours. Hemoglobin A1C: No results for input(s): HGBA1C in the last 72 hours. Fasting Lipid Panel: No results for input(s): CHOL, HDL, LDLCALC, TRIG, CHOLHDL, LDLDIRECT in the last 72 hours. Thyroid Function Tests: No results for input(s): TSH, T4TOTAL, T3FREE, THYROIDAB in the last 72 hours.  Invalid input(s): FREET3 Anemia Panel: No results for input(s): VITAMINB12, FOLATE, FERRITIN, TIBC, IRON, RETICCTPCT in the last 72 hours.  RADIOLOGY: Dg Chest 2 View  Result Date: 03/28/2016 CLINICAL DATA:  Shortness of breath EXAM: CHEST  2 VIEW COMPARISON:  04/25/2007 FINDINGS: Left-sided single lead pacing device with lead over right ventricle. Moderate cardiomegaly without overt failure. No acute infiltrate, consolidation, or pleural effusion. No  pneumothorax peer IMPRESSION: Cardiomegaly without overt failure.  No acute infiltrate Electronically Signed   By: Donavan Foil M.D.   On: 03/28/2016 01:55   Nm Myocar Multi W/spect W/wall Motion / Ef  Result Date: 03/29/2016 CLINICAL DATA:  History of cardiomyopathy and atrial flutter. Acute on chronic systolic CHF. EXAM: MYOCARDIAL IMAGING WITH SPECT (REST AND PHARMACOLOGIC-STRESS) GATED LEFT VENTRICULAR WALL MOTION STUDY LEFT VENTRICULAR EJECTION FRACTION TECHNIQUE: Standard myocardial SPECT imaging was performed after resting intravenous injection of 10 mCi Tc-73m tetrofosmin. Subsequently, intravenous infusion of Lexiscan was performed under the supervision of the Cardiology  staff. At peak effect of the drug, 30 mCi Tc-72m tetrofosmin was injected intravenously and standard myocardial SPECT imaging was performed. Quantitative gated imaging was also performed to evaluate left ventricular wall motion, and estimate left ventricular ejection fraction. COMPARISON:  None. FINDINGS: Perfusion: There is a large fixed defect involving the inferolateral wall compatible with an infarct. There is no evidence to suggest reversibility or ischemia. Left ventricle is dilated. Wall Motion: There is markedly abnormal wall motion throughout the left ventricle. Severe hypokinesia throughout the left ventricle and difficult to exclude areas of paradoxical motion due to the minimal wall motion. Left Ventricular Ejection Fraction: 7 % End diastolic volume 220 ml End systolic volume 254 ml IMPRESSION: 1. Large infarct involving the inferolateral wall. No evidence for ischemia or reversibility. 2. Markedly abnormal wall motion throughout the left ventricle. Severe hypokinesia throughout the left ventricle. 3. Left ventricular ejection fraction is 7%. 4. Non invasive risk stratification*: High *2012 Appropriate Use Criteria for Coronary Revascularization Focused Update: J Am Coll Cardiol. 2706;23(7):628-315. http://content.airportbarriers.com.aspx?articleid=[email or web address] Electronically Signed   By: Markus Daft M.D.   On: 03/29/2016 13:52   Dg Chest Port 1 View  Result Date: 04/01/2016 CLINICAL DATA:  Acute on chronic systolic congestive heart failure. Nonischemic cardiomyopathy. EXAM: PORTABLE CHEST 1 VIEW COMPARISON:  03/28/2016 FINDINGS: Stable mild cardiomegaly. AICD remains in stable position with tip overlying the left ventricle. No pneumothorax visualized. Mild opacity in the left costophrenic angle may be due to atelectasis or small pleural effusion. Lung fields are otherwise clear. IMPRESSION: New opacity in left costophrenic angle, which may be due to atelectasis or pleural effusion. Electronically  Signed   By: Earle Gell M.D.   On: 04/01/2016 20:57    PHYSICAL EXAM CVP 6-7 General: NAD. In the chair  HEENT: poor dentition Neck: JVP 6-7. No thyromegaly or thyroid nodule.  Lungs: Decreased in the bases with crackles. .  CV: Lateral PMI.  Heart irregular S1/S2, +prominent s3 no murmur.  No edema. Extremities warm.  Abdomen: Soft, nontender, no hepatosplenomegaly, no distention.  Neurologic: Alert and oriented x 3.  Psych: Flat affect. Extremities: No clubbing or cyanosis.   TELEMETRY:  Atrial fibrillation 70-80s   ASSESSMENT AND PLAN: 46 yo with long history of nonischemic cardiomyopathy, HTN, DM, and CKD stage III presented with exertional dyspnea/CHF exacerbation and was noted to have had ICD discharge on 1/14 for polymorphic VT.  1. Atrial flutter with mild RVR => atrial fibrillation: Newly noted, was in atrial flutter at admission.  Now s/p AFL ablation 1/19.  Currently in atrial fibrillation.  - Continue amiodarone gtt, will plan DCCV today.  He has been continuously anticoagulated.  - Continue heparin with AKI.  2. Acute on chronic systolic CHF -> cardiogenic shock: EF 04/02/2016  EF 20% on echo with moderate to severely decreased RV systolic function.  Possibly hypertensive cardiomyopathy.  Had cath around time of diagnosis in 2006 without significant  coronary disease.  Cardiolite 1/17 showed EF 7% with inferolateral fixed defect, no ischemia (possible artifact).  TEE with severe biventricular dysfunction.  He developed cardiogenic shock with AKI after flutter ablation. Norepinephrine now off.   Todays CO-OX is 60%. CVP down 6-7.   - Stop dobutamine   - Hold IV lasix and metolazone.  Start torsemide 60 mg twice a day tomorrow.  - Continue Bidil to 2 tab tid.  3. Acute on CKD: Stage IV.  Creatinine 3.62, mildly higher than yesterday. Suspect low output cardio-renal -> ATN. See above.  Renal was following but has now signed off with plateau in creatinine.  4. DM: Poor control =>  hgbA1c 13.6.  He does not know who manages this at home (?no PCP).  Has just been taking metformin.  On insulin in hospital, adjusting with help of diabetes nurse coordinator.  Need care management assistance for home health and to see if we can find him a PCP.   5. VT: Polymorphic VT on 1/14 with ICD discharge, VT on 01/11/16 with ICD discharge.  Suspect related to CHF exacerbation rather than ischemia. On Amio drip with A fib - Keep K> 4 and mag > 2.  K 4  Consult cardiac rehab.    Amy Clegg NP-C  04/07/2016 8:03 AM   Patient seen with NP, agree with the above note.  Successful cardioversion today, now back in NSR.  He is off dobutamine with adequate co-ox this morning.  CVP 6-7.  IV Lasix/metolazone stopped today with good co-ox and rise in creatinine.  Tomorrow, if creatinine is stable, will start torsemide 60 mg po bid.   As creatinine is remaining > 3, will start him on warfarin rather than going back to DOAC.  Will start tonight, will need bridge to therapeutic warfarin with DCCV today.    Can stop amiodarone gtt tomorrow morning and start po.   Loralie Champagne 04/07/2016 2:06 PM

## 2016-04-07 NOTE — Anesthesia Postprocedure Evaluation (Signed)
Anesthesia Post Note  Patient: Travis Bryan  Procedure(s) Performed: Procedure(s) (LRB): CARDIOVERSION (N/A)  Patient location during evaluation: Endoscopy Anesthesia Type: General Level of consciousness: awake and alert Pain management: pain level controlled Vital Signs Assessment: post-procedure vital signs reviewed and stable Respiratory status: spontaneous breathing, nonlabored ventilation, respiratory function stable and patient connected to nasal cannula oxygen Cardiovascular status: blood pressure returned to baseline and stable Postop Assessment: no signs of nausea or vomiting Anesthetic complications: no       Last Vitals:  Vitals:   04/07/16 1100 04/07/16 1110  BP: 108/68 (!) 97/46  Pulse: 71 71  Resp: (!) 21 14  Temp:  36.5 C    Last Pain:  Vitals:   04/07/16 1110  TempSrc: Oral  PainSc:                  [name]

## 2016-04-08 ENCOUNTER — Encounter (HOSPITAL_COMMUNITY): Payer: Self-pay | Admitting: Cardiovascular Disease

## 2016-04-08 LAB — CBC
HEMATOCRIT: 43.9 % (ref 39.0–52.0)
HEMOGLOBIN: 14.6 g/dL (ref 13.0–17.0)
MCH: 30.1 pg (ref 26.0–34.0)
MCHC: 33.3 g/dL (ref 30.0–36.0)
MCV: 90.5 fL (ref 78.0–100.0)
Platelets: 261 10*3/uL (ref 150–400)
RBC: 4.85 MIL/uL (ref 4.22–5.81)
RDW: 14 % (ref 11.5–15.5)
WBC: 5.8 10*3/uL (ref 4.0–10.5)

## 2016-04-08 LAB — GLUCOSE, CAPILLARY
GLUCOSE-CAPILLARY: 67 mg/dL (ref 65–99)
GLUCOSE-CAPILLARY: 123 mg/dL — AB (ref 65–99)
GLUCOSE-CAPILLARY: 113 mg/dL — AB (ref 65–99)
Glucose-Capillary: 78 mg/dL (ref 65–99)

## 2016-04-08 LAB — BASIC METABOLIC PANEL
ANION GAP: 15 (ref 5–15)
BUN: 70 mg/dL — ABNORMAL HIGH (ref 6–20)
CHLORIDE: 89 mmol/L — AB (ref 101–111)
CO2: 31 mmol/L (ref 22–32)
Calcium: 9.9 mg/dL (ref 8.9–10.3)
Creatinine, Ser: 3.93 mg/dL — ABNORMAL HIGH (ref 0.61–1.24)
GFR calc non Af Amer: 17 mL/min — ABNORMAL LOW (ref >60)
GFR, EST AFRICAN AMERICAN: 20 mL/min — AB (ref >60)
GLUCOSE: 63 mg/dL — AB (ref 65–99)
Potassium: 3.9 mmol/L (ref 3.5–5.1)
Sodium: 135 mmol/L (ref 135–145)

## 2016-04-08 LAB — PROTIME-INR
INR: 1.17
Prothrombin Time: 15 seconds (ref 11.4–15.2)

## 2016-04-08 LAB — COOXEMETRY PANEL
Carboxyhemoglobin: 1.8 % — ABNORMAL HIGH (ref 0.5–1.5)
Methemoglobin: 0.6 % (ref 0.0–1.5)
O2 Saturation: 70.6 %
TOTAL HEMOGLOBIN: 15 g/dL (ref 12.0–16.0)

## 2016-04-08 LAB — HEPARIN LEVEL (UNFRACTIONATED): HEPARIN UNFRACTIONATED: 0.63 [IU]/mL (ref 0.30–0.70)

## 2016-04-08 MED ORDER — WARFARIN SODIUM 7.5 MG PO TABS
7.5000 mg | ORAL_TABLET | Freq: Once | ORAL | Status: AC
  Administered 2016-04-08: 7.5 mg via ORAL
  Filled 2016-04-08: qty 1

## 2016-04-08 MED ORDER — AMIODARONE HCL 200 MG PO TABS
200.0000 mg | ORAL_TABLET | Freq: Two times a day (BID) | ORAL | Status: DC
  Administered 2016-04-08 – 2016-04-09 (×3): 200 mg via ORAL
  Filled 2016-04-08 (×3): qty 1

## 2016-04-08 NOTE — Progress Notes (Signed)
ANTICOAGULATION CONSULT NOTE - Follow Up Consult  Pharmacy Consult for Heparin bridge to warfarin Indication: atrial fibrillation  No Known Allergies  Patient Measurements: Height: 5\' 10"  (177.8 cm) Weight: 223 lb 4.8 oz (101.3 kg) IBW/kg (Calculated) : 73 Heparin Dosing Weight: 96kg  Vital Signs: Temp: 97.7 F (36.5 C) (01/27 0857) Temp Source: Oral (01/27 0857) BP: 124/85 (01/27 0857) Pulse Rate: 77 (01/27 0857)  Labs:  Recent Labs  04/05/16 1254  04/06/16 0500 04/07/16 0510 04/08/16 0458  HGB  --   < > 13.9 14.1 14.6  HCT  --   --  41.4 43.0 43.9  PLT  --   --  283 270 261  APTT 77*  --  87* 87*  --   LABPROT  --   --   --   --  15.0  INR  --   --   --   --  1.17  HEPARINUNFRC  --   --  0.80* 0.63 0.63  CREATININE  --   --  3.47* 3.62* 3.93*  < > = values in this interval not displayed.  Estimated Creatinine Clearance: 28.3 mL/min (by C-G formula based on SCr of 3.93 mg/dL (H)).   Medications:  Heparin @ 1500 units/hr  Assessment: 45yom transitioned from apixaban to heparin 1/21 for afib given acute renal failure and need for possible CRRT. Last apixaban dose 1/21 @ 0940. Used both APTT and heparin level with DOAC but they now appear to correlate. Transition to heparin levels only, therapeutic today. Successful DCCV 1/26. SCr still increasing (3.93 today) but patient still producing urine - will likely avoid CRRT this admit per renal, not outpatient dialysis candidate either. Given poor renal function, transitioning to coumadin as opposed to going back on apixaban.   Goal of Therapy:  INR goal 2-3 Heparin level 0.3-0.7 Monitor platelets by anticoagulation protocol: Yes   Plan:  1) Continue heparin at 1500 units/hr 2) Daily heparin level, and CBC 3) Warfarin 7.5mg  tonight 4) Daily INR  Melburn Popper, PharmD Clinical Pharmacy Resident Pager: [phone] 04/08/16 10:25 AM

## 2016-04-08 NOTE — Progress Notes (Signed)
CARDIAC REHAB PHASE I   PRE:  Rate/Rhythm: 74 SR  BP:  Supine:   Sitting: 113/78  Standing:    SaO2: 95%RA  MODE:  Ambulation: 940 ft   POST:  Rate/Rhythm: 103 ST  BP:  Supine:   Sitting: 135/92  Standing:    SaO2: 94%RA 1350-1416 Pt walked 940 ft on RA with slow steady gait. Left foot still a little painful. To recliner after walk. Tolerated well. Encouraged him to eat green vegetables as they are not as starchy.. Asked him what Diabetic RN talked with him about. He stated same things I heard before. A little of this and a little of that. Not sure best way to reach his educational needs. Would help to have family here so they could re enforce. Not interested in looking at CHF booklet at this time.   Graylon Good, RN BSN  04/08/2016 2:12 PM

## 2016-04-08 NOTE — Progress Notes (Signed)
Cbg-67mg /dl. Asymptomatic, finished breakfast and oj 120 ml given. Repeat cbg-76mg /dl. Cont. To monitor.

## 2016-04-08 NOTE — Progress Notes (Addendum)
Patient ID: Travis Bryan, male   DOB: 1970/08/09, 46 y.o.   MRN: [id number]   SUBJECTIVE:   Admitted with acute/chronic systolic CHF and atrial flutter. Underwent TEE and AFL ablation 1/19.  Post-procedure developed AKI and cardiogenic shock, started on dobutamine and norepinephrine.  He subsequently went into atrial flutter with controlled rate on amiodarone.   Dobutamine stopped yesterday and underwent DCCV.  IV lasix stopped yesterday.  Co-ox 71% today. Feels ok. Anxious to go home. Creatinine climbing again. 3.5->3.6->3.9. CVP not set up   Denies SOB/CP.    EF 15-20% with moderate to severe RV HK  Cardiolite: EF 7%, inferolateral fixed defect no ischemia.   Scheduled Meds: . insulin aspart  0-20 Units Subcutaneous TID WC  . insulin aspart  6 Units Subcutaneous TID WC  . insulin glargine  25 Units Subcutaneous QHS  . isosorbide-hydrALAZINE  2 tablet Oral TID  . magnesium oxide  400 mg Oral Daily  . mouth rinse  15 mL Mouth Rinse BID  . torsemide  60 mg Oral BID  . warfarin  7.5 mg Oral ONCE-1800  . Warfarin - Pharmacist Dosing Inpatient   Does not apply q1800   Continuous Infusions: . amiodarone 30 mg/hr (04/08/16 1100)  . heparin 1,500 Units/hr (04/08/16 1000)  . norepinephrine (LEVOPHED) Adult infusion Stopped (04/04/16 0335)   PRN Meds:.acetaminophen, ondansetron (ZOFRAN) IV, oxyCODONE-acetaminophen, sodium chloride flush    Vitals:   04/08/16 0300 04/08/16 0500 04/08/16 0857 04/08/16 1156  BP: 114/72  124/85   Pulse: 70  77 77  Resp: 16  17 18   Temp: 97.3 F (36.3 C)  97.7 F (36.5 C) 99 F (37.2 C)  TempSrc: Oral  Oral Oral  SpO2: (!) 89%  90% 91%  Weight:  101.3 kg (223 lb 4.8 oz)    Height:        Intake/Output Summary (Last 24 hours) at 04/08/16 1202 Last data filed at 04/08/16 1157  Gross per 24 hour  Intake              915 ml  Output             1350 ml  Net             -435 ml    LABS: Basic Metabolic Panel:  Recent Labs  04/07/16 0510  04/08/16 0458  NA 133* 135  K 4.0 3.9  CL 89* 89*  CO2 31 31  GLUCOSE 128* 63*  BUN 69* 70*  CREATININE 3.62* 3.93*  CALCIUM 9.9 9.9   Liver Function Tests: No results for input(s): AST, ALT, ALKPHOS, BILITOT, PROT, ALBUMIN in the last 72 hours. No results for input(s): LIPASE, AMYLASE in the last 72 hours. CBC:  Recent Labs  04/07/16 0510 04/08/16 0458  WBC 5.2 5.8  HGB 14.1 14.6  HCT 43.0 43.9  MCV 89.8 90.5  PLT 270 261   Cardiac Enzymes: No results for input(s): CKTOTAL, CKMB, CKMBINDEX, TROPONINI in the last 72 hours. BNP: Invalid input(s): POCBNP D-Dimer: No results for input(s): DDIMER in the last 72 hours. Hemoglobin A1C: No results for input(s): HGBA1C in the last 72 hours. Fasting Lipid Panel: No results for input(s): CHOL, HDL, LDLCALC, TRIG, CHOLHDL, LDLDIRECT in the last 72 hours. Thyroid Function Tests: No results for input(s): TSH, T4TOTAL, T3FREE, THYROIDAB in the last 72 hours.  Invalid input(s): FREET3 Anemia Panel: No results for input(s): VITAMINB12, FOLATE, FERRITIN, TIBC, IRON, RETICCTPCT in the last 72 hours.  RADIOLOGY: Dg Chest 2 View  Result Date: 03/28/2016 CLINICAL DATA:  Shortness of breath EXAM: CHEST  2 VIEW COMPARISON:  04/25/2007 FINDINGS: Left-sided single lead pacing device with lead over right ventricle. Moderate cardiomegaly without overt failure. No acute infiltrate, consolidation, or pleural effusion. No pneumothorax peer IMPRESSION: Cardiomegaly without overt failure.  No acute infiltrate Electronically Signed   By: Donavan Foil M.D.   On: 03/28/2016 01:55   Nm Myocar Multi W/spect W/wall Motion / Ef  Result Date: 03/29/2016 CLINICAL DATA:  History of cardiomyopathy and atrial flutter. Acute on chronic systolic CHF. EXAM: MYOCARDIAL IMAGING WITH SPECT (REST AND PHARMACOLOGIC-STRESS) GATED LEFT VENTRICULAR WALL MOTION STUDY LEFT VENTRICULAR EJECTION FRACTION TECHNIQUE: Standard myocardial SPECT imaging was performed after  resting intravenous injection of 10 mCi Tc-36m tetrofosmin. Subsequently, intravenous infusion of Lexiscan was performed under the supervision of the Cardiology staff. At peak effect of the drug, 30 mCi Tc-22m tetrofosmin was injected intravenously and standard myocardial SPECT imaging was performed. Quantitative gated imaging was also performed to evaluate left ventricular wall motion, and estimate left ventricular ejection fraction. COMPARISON:  None. FINDINGS: Perfusion: There is a large fixed defect involving the inferolateral wall compatible with an infarct. There is no evidence to suggest reversibility or ischemia. Left ventricle is dilated. Wall Motion: There is markedly abnormal wall motion throughout the left ventricle. Severe hypokinesia throughout the left ventricle and difficult to exclude areas of paradoxical motion due to the minimal wall motion. Left Ventricular Ejection Fraction: 7 % End diastolic volume 824 ml End systolic volume 235 ml IMPRESSION: 1. Large infarct involving the inferolateral wall. No evidence for ischemia or reversibility. 2. Markedly abnormal wall motion throughout the left ventricle. Severe hypokinesia throughout the left ventricle. 3. Left ventricular ejection fraction is 7%. 4. Non invasive risk stratification*: High *2012 Appropriate Use Criteria for Coronary Revascularization Focused Update: J Am Coll Cardiol. 3614;43(1):540-086. http://[email or web address] Electronically Signed   By: Markus Daft M.D.   On: 03/29/2016 13:52   Dg Chest Port 1 View  Result Date: 04/01/2016 CLINICAL DATA:  Acute on chronic systolic congestive heart failure. Nonischemic cardiomyopathy. EXAM: PORTABLE CHEST 1 VIEW COMPARISON:  03/28/2016 FINDINGS: Stable mild cardiomegaly. AICD remains in stable position with tip overlying the left ventricle. No pneumothorax visualized. Mild opacity in the left costophrenic angle may be due to atelectasis or small pleural  effusion. Lung fields are otherwise clear. IMPRESSION: New opacity in left costophrenic angle, which may be due to atelectasis or pleural effusion. Electronically Signed   By: Earle Gell M.D.   On: 04/01/2016 20:57    PHYSICAL EXAM General: NAD. In bed HEENT: poor dentition Neck: JVP 8-9. No thyromegaly or thyroid nodule.  Lungs: Decreased in the bases with crackles. .  CV: Lateral PMI.  Heart regular S1/S2, +prominent s3 no murmur.  No edema. Extremities warm.  Abdomen: Soft, nontender, no hepatosplenomegaly, no distention.  Neurologic: Alert and oriented x 3.  Psych: Flat affect. Extremities: No clubbing or cyanosis.   TELEMETRY:  Atrial fibrillation 70-80s   ASSESSMENT AND PLAN: 46 yo with long history of nonischemic cardiomyopathy, HTN, DM, and CKD stage III presented with exertional dyspnea/CHF exacerbation and was noted to have had ICD discharge on 1/14 for polymorphic VT.  1. Atrial flutter with mild RVR => atrial fibrillation: Newly noted, was in atrial flutter at admission.  Now s/p AFL ablation 1/19.   - S/p DCVV 1/26 - Switch amio to po - INR 1.17. Loading coumadin. Continue heparin. 2. Acute on chronic systolic CHF -> cardiogenic shock: EF 04/02/2016  EF 20% on echo with moderate to severely decreased RV systolic function.  Possibly hypertensive cardiomyopathy.  Had cath around time of diagnosis in 2006 without significant coronary disease.  Cardiolite 1/17 showed EF 7% with inferolateral fixed defect, no ischemia (possible artifact).  TEE with severe biventricular dysfunction.  He developed cardiogenic shock with AKI after flutter ablation. Norepinephrine now off.   Todays CO-OX is 71%. CVP down 6-7.   - Now off norepi and dobutamine   - Renal function continues to deteriorate despite normal co-ox and adequate BP. Continue to hold diuretics. May need to cut abck Bidil.  - Continue Bidil to 2 tab tid.  3. Acute on CKD: Stage IV.  Creatinine trending back up despite normal  co-ox. Continue to hold diuretics.  - Renal has signed off. May need to reconsult. He is not candidate for HD. If creatinine worsening may need to consider Hospice care.  4. DM: Poor control => hgbA1c 13.6.  He does not know who manages this at home (?no PCP).  Has just been taking metformin.  On insulin in hospital, adjusting with help of diabetes nurse coordinator.  Need care management assistance for home health and to see if we can find him a PCP.   5. VT: Polymorphic VT on 1/14 with ICD discharge, VT on 01/11/16 with ICD discharge.  Suspect related to CHF exacerbation rather than ischemia. On Amio drip with A fib - Keep K> 4 and mag > 2.  K 4   Maizie Garno MD 04/08/2016 12:02 PM

## 2016-04-09 LAB — BASIC METABOLIC PANEL
ANION GAP: 14 (ref 5–15)
BUN: 68 mg/dL — ABNORMAL HIGH (ref 6–20)
CALCIUM: 9.9 mg/dL (ref 8.9–10.3)
CO2: 29 mmol/L (ref 22–32)
Chloride: 89 mmol/L — ABNORMAL LOW (ref 101–111)
Creatinine, Ser: 3.95 mg/dL — ABNORMAL HIGH (ref 0.61–1.24)
GFR, EST AFRICAN AMERICAN: 20 mL/min — AB (ref >60)
GFR, EST NON AFRICAN AMERICAN: 17 mL/min — AB (ref >60)
Glucose, Bld: 123 mg/dL — ABNORMAL HIGH (ref 65–99)
Potassium: 4 mmol/L (ref 3.5–5.1)
Sodium: 132 mmol/L — ABNORMAL LOW (ref 135–145)

## 2016-04-09 LAB — GLUCOSE, CAPILLARY
GLUCOSE-CAPILLARY: 157 mg/dL — AB (ref 65–99)
GLUCOSE-CAPILLARY: 202 mg/dL — AB (ref 65–99)
GLUCOSE-CAPILLARY: 172 mg/dL — AB (ref 65–99)
Glucose-Capillary: 100 mg/dL — ABNORMAL HIGH (ref 65–99)
Glucose-Capillary: 106 mg/dL — ABNORMAL HIGH (ref 65–99)

## 2016-04-09 LAB — COOXEMETRY PANEL
CARBOXYHEMOGLOBIN: 1.9 % — AB (ref 0.5–1.5)
Methemoglobin: 0.6 % (ref 0.0–1.5)
O2 SAT: 61.8 %
Total hemoglobin: 14.7 g/dL (ref 12.0–16.0)

## 2016-04-09 LAB — CBC
HCT: 42.4 % (ref 39.0–52.0)
Hemoglobin: 14.2 g/dL (ref 13.0–17.0)
MCH: 29.8 pg (ref 26.0–34.0)
MCHC: 33.5 g/dL (ref 30.0–36.0)
MCV: 88.9 fL (ref 78.0–100.0)
PLATELETS: 260 10*3/uL (ref 150–400)
RBC: 4.77 MIL/uL (ref 4.22–5.81)
RDW: 13.8 % (ref 11.5–15.5)
WBC: 5 10*3/uL (ref 4.0–10.5)

## 2016-04-09 LAB — PROTIME-INR
INR: 1.18
Prothrombin Time: 15 seconds (ref 11.4–15.2)

## 2016-04-09 LAB — HEPARIN LEVEL (UNFRACTIONATED): HEPARIN UNFRACTIONATED: 0.59 [IU]/mL (ref 0.30–0.70)

## 2016-04-09 MED ORDER — WARFARIN SODIUM 10 MG PO TABS
10.0000 mg | ORAL_TABLET | Freq: Once | ORAL | Status: AC
  Administered 2016-04-09: 10 mg via ORAL
  Filled 2016-04-09: qty 1

## 2016-04-09 MED ORDER — AMIODARONE HCL IN DEXTROSE 360-4.14 MG/200ML-% IV SOLN
60.0000 mg/h | INTRAVENOUS | Status: AC
  Administered 2016-04-09 (×2): 60 mg/h via INTRAVENOUS
  Filled 2016-04-09 (×2): qty 200

## 2016-04-09 MED ORDER — AMIODARONE LOAD VIA INFUSION
150.0000 mg | Freq: Once | INTRAVENOUS | Status: AC
  Administered 2016-04-09: 150 mg via INTRAVENOUS
  Filled 2016-04-09: qty 83.34

## 2016-04-09 NOTE — Progress Notes (Signed)
Patient ID: Travis Bryan, male   DOB: 1970/09/17, 46 y.o.   MRN: [id number]   SUBJECTIVE:   Admitted with acute/chronic systolic CHF and atrial flutter. Underwent TEE and AFL ablation 1/19.  Post-procedure developed AKI and cardiogenic shock, started on dobutamine and norepinephrine.  He subsequently went into atrial flutter with controlled rate on amiodarone.   Dobutamine stopped 1/26 and underwent DCCV of atrial fib.  IV lasix stopped 1/27  Went back into AF last night. Rate controlled. Completely asymptomatic.Wants to go home.  CVP 3. Co-ox 62%  Creatinine 3.5->3.6->3.9->3.9    EF 15-20% with moderate to severe RV HK  Cardiolite: EF 7%, inferolateral fixed defect no ischemia.   Scheduled Meds: . insulin aspart  0-20 Units Subcutaneous TID WC  . insulin aspart  6 Units Subcutaneous TID WC  . insulin glargine  25 Units Subcutaneous QHS  . isosorbide-hydrALAZINE  2 tablet Oral TID  . magnesium oxide  400 mg Oral Daily  . mouth rinse  15 mL Mouth Rinse BID  . warfarin  10 mg Oral ONCE-1800  . Warfarin - Pharmacist Dosing Inpatient   Does not apply q1800   Continuous Infusions: . amiodarone 60 mg/hr (04/09/16 1205)  . heparin 1,500 Units/hr (04/09/16 0020)  . norepinephrine (LEVOPHED) Adult infusion Stopped (04/04/16 0335)   PRN Meds:.acetaminophen, ondansetron (ZOFRAN) IV, oxyCODONE-acetaminophen, sodium chloride flush    Vitals:   04/09/16 0900 04/09/16 1000 04/09/16 1100 04/09/16 1200  BP: 111/84 111/67    Pulse: (!) 116 (!) 46  (!) 32  Resp: 19 (!) 21 (!) 24 18  Temp:      TempSrc:      SpO2: 90% 97%  94%  Weight:      Height:        Intake/Output Summary (Last 24 hours) at 04/09/16 1314 Last data filed at 04/09/16 1204  Gross per 24 hour  Intake              735 ml  Output             2300 ml  Net            -1565 ml    LABS: Basic Metabolic Panel:  Recent Labs  04/08/16 0458 04/09/16 0415  NA 135 132*  K 3.9 4.0  CL 89* 89*  CO2 31 29  GLUCOSE  63* 123*  BUN 70* 68*  CREATININE 3.93* 3.95*  CALCIUM 9.9 9.9   Liver Function Tests: No results for input(s): AST, ALT, ALKPHOS, BILITOT, PROT, ALBUMIN in the last 72 hours. No results for input(s): LIPASE, AMYLASE in the last 72 hours. CBC:  Recent Labs  04/08/16 0458 04/09/16 0415  WBC 5.8 5.0  HGB 14.6 14.2  HCT 43.9 42.4  MCV 90.5 88.9  PLT 261 260   Cardiac Enzymes: No results for input(s): CKTOTAL, CKMB, CKMBINDEX, TROPONINI in the last 72 hours. BNP: Invalid input(s): POCBNP D-Dimer: No results for input(s): DDIMER in the last 72 hours. Hemoglobin A1C: No results for input(s): HGBA1C in the last 72 hours. Fasting Lipid Panel: No results for input(s): CHOL, HDL, LDLCALC, TRIG, CHOLHDL, LDLDIRECT in the last 72 hours. Thyroid Function Tests: No results for input(s): TSH, T4TOTAL, T3FREE, THYROIDAB in the last 72 hours.  Invalid input(s): FREET3 Anemia Panel: No results for input(s): VITAMINB12, FOLATE, FERRITIN, TIBC, IRON, RETICCTPCT in the last 72 hours.  RADIOLOGY: Dg Chest 2 View  Result Date: 03/28/2016 CLINICAL DATA:  Shortness of breath EXAM: CHEST  2 VIEW COMPARISON:  04/25/2007 FINDINGS: Left-sided single lead pacing device with lead over right ventricle. Moderate cardiomegaly without overt failure. No acute infiltrate, consolidation, or pleural effusion. No pneumothorax peer IMPRESSION: Cardiomegaly without overt failure.  No acute infiltrate Electronically Signed   By: Donavan Foil M.D.   On: 03/28/2016 01:55   Nm Myocar Multi W/spect W/wall Motion / Ef  Result Date: 03/29/2016 CLINICAL DATA:  History of cardiomyopathy and atrial flutter. Acute on chronic systolic CHF. EXAM: MYOCARDIAL IMAGING WITH SPECT (REST AND PHARMACOLOGIC-STRESS) GATED LEFT VENTRICULAR WALL MOTION STUDY LEFT VENTRICULAR EJECTION FRACTION TECHNIQUE: Standard myocardial SPECT imaging was performed after resting intravenous injection of 10 mCi Tc-45m tetrofosmin. Subsequently,  intravenous infusion of Lexiscan was performed under the supervision of the Cardiology staff. At peak effect of the drug, 30 mCi Tc-23m tetrofosmin was injected intravenously and standard myocardial SPECT imaging was performed. Quantitative gated imaging was also performed to evaluate left ventricular wall motion, and estimate left ventricular ejection fraction. COMPARISON:  None. FINDINGS: Perfusion: There is a large fixed defect involving the inferolateral wall compatible with an infarct. There is no evidence to suggest reversibility or ischemia. Left ventricle is dilated. Wall Motion: There is markedly abnormal wall motion throughout the left ventricle. Severe hypokinesia throughout the left ventricle and difficult to exclude areas of paradoxical motion due to the minimal wall motion. Left Ventricular Ejection Fraction: 7 % End diastolic volume 620 ml End systolic volume 355 ml IMPRESSION: 1. Large infarct involving the inferolateral wall. No evidence for ischemia or reversibility. 2. Markedly abnormal wall motion throughout the left ventricle. Severe hypokinesia throughout the left ventricle. 3. Left ventricular ejection fraction is 7%. 4. Non invasive risk stratification*: High *2012 Appropriate Use Criteria for Coronary Revascularization Focused Update: J Am Coll Cardiol. 9741;63(8):453-646. http://[email or web address] Electronically Signed   By: Markus Daft M.D.   On: 03/29/2016 13:52   Dg Chest Port 1 View  Result Date: 04/01/2016 CLINICAL DATA:  Acute on chronic systolic congestive heart failure. Nonischemic cardiomyopathy. EXAM: PORTABLE CHEST 1 VIEW COMPARISON:  03/28/2016 FINDINGS: Stable mild cardiomegaly. AICD remains in stable position with tip overlying the left ventricle. No pneumothorax visualized. Mild opacity in the left costophrenic angle may be due to atelectasis or small pleural effusion. Lung fields are otherwise clear. IMPRESSION: New opacity in left  costophrenic angle, which may be due to atelectasis or pleural effusion. Electronically Signed   By: Earle Gell M.D.   On: 04/01/2016 20:57    PHYSICAL EXAM CVP 3 General: NAD. Sitting in chair HEENT: poor dentition Neck: JVP flat No thyromegaly or thyroid nodule.  Lungs: Clear.  CV: Lateral PMI.  Heart irregular S1/S2, +s3 no murmur.  No edema. Extremities warm.  Abdomen: Soft, nontender, no hepatosplenomegaly, no distention.  Neurologic: Alert and oriented x 3.  Psych: Flat affect. Extremities: No clubbing or cyanosis.   TELEMETRY:  Atrial fibrillation 70-80s   ASSESSMENT AND PLAN: 46 yo with long history of nonischemic cardiomyopathy, HTN, DM, and CKD stage III presented with exertional dyspnea/CHF exacerbation and was noted to have had ICD discharge on 1/14 for polymorphic VT.  1. Atrial flutter with mild RVR => atrial fibrillation: Newly noted, was in atrial flutter at admission.  Now s/p AFL ablation 1/19.   - S/p DCVV 1/26 - Back in atrial fib this am but asymptomatic. Rate controlled and co-ox ok.  - Will restart IV amio (was on po). Will watch co-ox and rate. If rate and co-ox stable may elect for rate control strategy. If dropping then will  need to cosndier repeat DC-CV with possible addition of Ranexa.  - INR 1.18. Loading coumadin. Continue heparin. - NPO after MN.  2. Acute on chronic systolic CHF -> cardiogenic shock: EF 04/02/2016  EF 20% on echo with moderate to severely decreased RV systolic function.  Possibly hypertensive cardiomyopathy.  Had cath around time of diagnosis in 2006 without significant coronary disease.  Cardiolite 1/17 showed EF 7% with inferolateral fixed defect, no ischemia (possible artifact).  TEE with severe biventricular dysfunction.  He developed cardiogenic shock with AKI after flutter ablation. Norepinephrine now off.   --Todays CO-OX is 62%. CVP down 3.   - Now off norepi and dobutamine   - Renal function elevated but stable. Continue to hold  diuretics. Follow creatinine closely.  - Continue Bidil to 2 tab tid. Can cut back if SBP < 110 consistently - Off b-blocker and ACE due to low ouput and AKI 3. Acute on CKD: Stage IV.  Creatinine trending back up despite normal co-ox. Continue to hold diuretics.  - Renal has signed off. May need to reconsult. He is not candidate for HD. If creatinine worsening may need to consider Hospice care.  4. DM: Poor control => hgbA1c 13.6.  He does not know who manages this at home (?no PCP).  Has just been taking metformin.  On insulin in hospital, adjusting with help of diabetes nurse coordinator.  Need care management assistance for home health and to see if we can find him a PCP.   5. VT: Polymorphic VT on 1/14 with ICD discharge, VT on 01/11/16 with ICD discharge.  Suspect related to CHF exacerbation rather than ischemia.  -Quiescent on amio - Keep K> 4 and mag > 2.  K 4   Bensimhon, Daniel MD 04/09/2016 1:14 PM

## 2016-04-09 NOTE — Progress Notes (Signed)
ANTICOAGULATION CONSULT NOTE - Follow Up Consult  Pharmacy Consult for heparin bridge to warfarin Indication: atrial fibrillation  No Known Allergies  Patient Measurements: Height: 5\' 10"  (177.8 cm) Weight: 223 lb (101.2 kg) IBW/kg (Calculated) : 73 Heparin Dosing Weight: 96kg  Vital Signs: Temp: 97.8 F (36.6 C) (01/28 0300) Temp Source: Oral (01/28 0300) BP: 136/95 (01/28 0700) Pulse Rate: 68 (01/28 0700)  Labs:  Recent Labs  04/07/16 0510 04/08/16 0458 04/09/16 0415  HGB 14.1 14.6 14.2  HCT 43.0 43.9 42.4  PLT 270 261 260  APTT 87*  --   --   LABPROT  --  15.0 15.0  INR  --  1.17 1.18  HEPARINUNFRC 0.63 0.63 0.59  CREATININE 3.62* 3.93* 3.95*    Estimated Creatinine Clearance: 28.2 mL/min (by C-G formula based on SCr of 3.95 mg/dL (H)).   Medications:  Heparin @ 1500 units/hr  Assessment: 45yom transitioned from apixaban to heparin 1/21 for afib given acute renal failure and need for possible CRRT. Last apixaban dose 1/21 @ 0940. Used both APTT and heparin level with DOAC but they now appear to correlate. Transition to heparin levels only, therapeutic today. Successful DCCV 1/26.   SCr still increasing (3.95 today) despite good co-ox and per HF note, may reconsult renal. Previously mentioned that patient is not an outpatient dialysis candidate. Given poor renal function, transitioning to coumadin as opposed to going back on apixaban. INR remains subtherapeutic. Increase dose today. CBC stable.  Goal of Therapy:  INR goal 2-3 Heparin level 0.3-0.7 Monitor platelets by anticoagulation protocol: Yes   Plan:  1) Continue heparin at 1500 units/hr 2) Daily heparin level, and CBC 3) Warfarin 10mg  tonight 4) Daily INR  Melburn Popper, PharmD Clinical Pharmacy Resident Pager: [phone] 04/09/16 8:25 AM

## 2016-04-09 NOTE — Progress Notes (Signed)
Spoke with Dr. Oval Linsey. Asa long as patient is asymptomatic we will continue to monitor and Dr. Haroldine Laws will be rounding this morning.

## 2016-04-09 NOTE — Progress Notes (Signed)
Paged on call cardiologist. Patient was cardioverted yesterday and is now back in [place] and SVT. Have had several calls from [place] concerning rhythm.

## 2016-04-10 LAB — COOXEMETRY PANEL
Carboxyhemoglobin: 1.7 % — ABNORMAL HIGH (ref 0.5–1.5)
METHEMOGLOBIN: 0.6 % (ref 0.0–1.5)
O2 Saturation: 61.6 %
Total hemoglobin: 15.4 g/dL (ref 12.0–16.0)

## 2016-04-10 LAB — GLUCOSE, CAPILLARY
GLUCOSE-CAPILLARY: 114 mg/dL — AB (ref 65–99)
GLUCOSE-CAPILLARY: 126 mg/dL — AB (ref 65–99)
Glucose-Capillary: 235 mg/dL — ABNORMAL HIGH (ref 65–99)
Glucose-Capillary: 72 mg/dL (ref 65–99)

## 2016-04-10 LAB — CBC
HEMATOCRIT: 44.9 % (ref 39.0–52.0)
HEMOGLOBIN: 15 g/dL (ref 13.0–17.0)
MCH: 29.8 pg (ref 26.0–34.0)
MCHC: 33.4 g/dL (ref 30.0–36.0)
MCV: 89.1 fL (ref 78.0–100.0)
Platelets: 293 10*3/uL (ref 150–400)
RBC: 5.04 MIL/uL (ref 4.22–5.81)
RDW: 14 % (ref 11.5–15.5)
WBC: 5.4 10*3/uL (ref 4.0–10.5)

## 2016-04-10 LAB — BASIC METABOLIC PANEL
ANION GAP: 13 (ref 5–15)
BUN: 69 mg/dL — ABNORMAL HIGH (ref 6–20)
CO2: 28 mmol/L (ref 22–32)
Calcium: 9.9 mg/dL (ref 8.9–10.3)
Chloride: 91 mmol/L — ABNORMAL LOW (ref 101–111)
Creatinine, Ser: 3.63 mg/dL — ABNORMAL HIGH (ref 0.61–1.24)
GFR calc non Af Amer: 19 mL/min — ABNORMAL LOW (ref >60)
GFR, EST AFRICAN AMERICAN: 22 mL/min — AB (ref >60)
GLUCOSE: 129 mg/dL — AB (ref 65–99)
POTASSIUM: 4 mmol/L (ref 3.5–5.1)
Sodium: 132 mmol/L — ABNORMAL LOW (ref 135–145)

## 2016-04-10 LAB — PROTIME-INR
INR: 1.21
Prothrombin Time: 15.4 seconds — ABNORMAL HIGH (ref 11.4–15.2)

## 2016-04-10 LAB — HEPARIN LEVEL (UNFRACTIONATED): HEPARIN UNFRACTIONATED: 0.54 [IU]/mL (ref 0.30–0.70)

## 2016-04-10 MED ORDER — SODIUM CHLORIDE 0.9% FLUSH
3.0000 mL | Freq: Two times a day (BID) | INTRAVENOUS | Status: DC
  Administered 2016-04-10 – 2016-04-12 (×6): 3 mL via INTRAVENOUS

## 2016-04-10 MED ORDER — SODIUM CHLORIDE 0.9% FLUSH: 3.0000 mL | INTRAVENOUS | Status: DC | PRN

## 2016-04-10 MED ORDER — SODIUM CHLORIDE 0.9 % IV SOLN
250.0000 mL | INTRAVENOUS | Status: DC
  Administered 2016-04-11: 08:00:00 via INTRAVENOUS
  Administered 2016-04-11: 500 mL via INTRAVENOUS

## 2016-04-10 MED ORDER — TORSEMIDE 20 MG PO TABS: 80.0000 mg | ORAL_TABLET | Freq: Every day | ORAL | Status: DC

## 2016-04-10 MED ORDER — RANOLAZINE ER 500 MG PO TB12
500.0000 mg | ORAL_TABLET | Freq: Two times a day (BID) | ORAL | Status: DC
  Administered 2016-04-10 – 2016-04-14 (×9): 500 mg via ORAL
  Filled 2016-04-10 (×9): qty 1

## 2016-04-10 MED ORDER — AMIODARONE HCL IN DEXTROSE 360-4.14 MG/200ML-% IV SOLN
30.0000 mg/h | INTRAVENOUS | Status: DC
  Administered 2016-04-10 – 2016-04-12 (×5): 30 mg/h via INTRAVENOUS
  Filled 2016-04-10 (×5): qty 200

## 2016-04-10 MED ORDER — WARFARIN SODIUM 10 MG PO TABS
10.0000 mg | ORAL_TABLET | Freq: Once | ORAL | Status: AC
  Administered 2016-04-10: 10 mg via ORAL
  Filled 2016-04-10: qty 1

## 2016-04-10 MED ORDER — TORSEMIDE 20 MG PO TABS
80.0000 mg | ORAL_TABLET | Freq: Every day | ORAL | Status: DC
  Administered 2016-04-11: 80 mg via ORAL
  Filled 2016-04-10: qty 4

## 2016-04-10 NOTE — Progress Notes (Signed)
Patient ID: Travis Bryan, male   DOB: 06-16-1970, 46 y.o.   MRN: [id number]   SUBJECTIVE:   Admitted with acute/chronic systolic CHF and atrial flutter. Underwent TEE and AFL ablation 1/19.  Post-procedure developed AKI and cardiogenic shock, started on dobutamine and norepinephrine.  He subsequently went into atrial flutter with controlled rate on amiodarone.   Dobutamine stopped 1/26 and underwent DCCV of atrial fib.  IV lasix stopped 1/27  Went back into AF 1/27. Rate controlled. Co-ox 62% today.  Weight down 1 lb.  No dyspnea.   Creatinine 3.5->3.6->3.9->3.9->3.63  EF 15-20% with moderate to severe RV HK  Cardiolite: EF 7%, inferolateral fixed defect no ischemia.   Scheduled Meds: . insulin aspart  0-20 Units Subcutaneous TID WC  . insulin aspart  6 Units Subcutaneous TID WC  . insulin glargine  25 Units Subcutaneous QHS  . isosorbide-hydrALAZINE  2 tablet Oral TID  . magnesium oxide  400 mg Oral Daily  . mouth rinse  15 mL Mouth Rinse BID  . ranolazine  500 mg Oral BID  . torsemide  80 mg Oral Daily  . Warfarin - Pharmacist Dosing Inpatient   Does not apply q1800   Continuous Infusions: . amiodarone 30 mg/hr (04/10/16 0116)  . heparin 1,500 Units/hr (04/10/16 0510)  . norepinephrine (LEVOPHED) Adult infusion Stopped (04/04/16 0335)   PRN Meds:.acetaminophen, ondansetron (ZOFRAN) IV, oxyCODONE-acetaminophen, sodium chloride flush    Vitals:   04/10/16 0000 04/10/16 0400 04/10/16 0500 04/10/16 0700  BP: 111/80 (!) 137/98  124/84  Pulse: 65 (!) 145  (!) 44  Resp: 16 (!) 21  18  Temp: 98.7 F (37.1 C)   98.2 F (36.8 C)  TempSrc: Oral   Oral  SpO2: 96% 93%  95%  Weight:   222 lb 7.1 oz (100.9 kg)   Height:        Intake/Output Summary (Last 24 hours) at 04/10/16 0858 Last data filed at 04/10/16 0700  Gross per 24 hour  Intake          1117.65 ml  Output             2700 ml  Net         -1582.35 ml    LABS: Basic Metabolic Panel:  Recent Labs   04/09/16 0415 04/10/16 0521  NA 132* 132*  K 4.0 4.0  CL 89* 91*  CO2 29 28  GLUCOSE 123* 129*  BUN 68* 69*  CREATININE 3.95* 3.63*  CALCIUM 9.9 9.9   Liver Function Tests: No results for input(s): AST, ALT, ALKPHOS, BILITOT, PROT, ALBUMIN in the last 72 hours. No results for input(s): LIPASE, AMYLASE in the last 72 hours. CBC:  Recent Labs  04/09/16 0415 04/10/16 0521  WBC 5.0 5.4  HGB 14.2 15.0  HCT 42.4 44.9  MCV 88.9 89.1  PLT 260 293   Cardiac Enzymes: No results for input(s): CKTOTAL, CKMB, CKMBINDEX, TROPONINI in the last 72 hours. BNP: Invalid input(s): POCBNP D-Dimer: No results for input(s): DDIMER in the last 72 hours. Hemoglobin A1C: No results for input(s): HGBA1C in the last 72 hours. Fasting Lipid Panel: No results for input(s): CHOL, HDL, LDLCALC, TRIG, CHOLHDL, LDLDIRECT in the last 72 hours. Thyroid Function Tests: No results for input(s): TSH, T4TOTAL, T3FREE, THYROIDAB in the last 72 hours.  Invalid input(s): FREET3 Anemia Panel: No results for input(s): VITAMINB12, FOLATE, FERRITIN, TIBC, IRON, RETICCTPCT in the last 72 hours.  RADIOLOGY: Dg Chest 2 View  Result Date: 03/28/2016 CLINICAL DATA:  Shortness of breath EXAM: CHEST  2 VIEW COMPARISON:  04/25/2007 FINDINGS: Left-sided single lead pacing device with lead over right ventricle. Moderate cardiomegaly without overt failure. No acute infiltrate, consolidation, or pleural effusion. No pneumothorax peer IMPRESSION: Cardiomegaly without overt failure.  No acute infiltrate Electronically Signed   By: Donavan Foil M.D.   On: 03/28/2016 01:55   Nm Myocar Multi W/spect W/wall Motion / Ef  Result Date: 03/29/2016 CLINICAL DATA:  History of cardiomyopathy and atrial flutter. Acute on chronic systolic CHF. EXAM: MYOCARDIAL IMAGING WITH SPECT (REST AND PHARMACOLOGIC-STRESS) GATED LEFT VENTRICULAR WALL MOTION STUDY LEFT VENTRICULAR EJECTION FRACTION TECHNIQUE: Standard myocardial SPECT imaging was  performed after resting intravenous injection of 10 mCi Tc-42m tetrofosmin. Subsequently, intravenous infusion of Lexiscan was performed under the supervision of the Cardiology staff. At peak effect of the drug, 30 mCi Tc-12m tetrofosmin was injected intravenously and standard myocardial SPECT imaging was performed. Quantitative gated imaging was also performed to evaluate left ventricular wall motion, and estimate left ventricular ejection fraction. COMPARISON:  None. FINDINGS: Perfusion: There is a large fixed defect involving the inferolateral wall compatible with an infarct. There is no evidence to suggest reversibility or ischemia. Left ventricle is dilated. Wall Motion: There is markedly abnormal wall motion throughout the left ventricle. Severe hypokinesia throughout the left ventricle and difficult to exclude areas of paradoxical motion due to the minimal wall motion. Left Ventricular Ejection Fraction: 7 % End diastolic volume 559 ml End systolic volume 741 ml IMPRESSION: 1. Large infarct involving the inferolateral wall. No evidence for ischemia or reversibility. 2. Markedly abnormal wall motion throughout the left ventricle. Severe hypokinesia throughout the left ventricle. 3. Left ventricular ejection fraction is 7%. 4. Non invasive risk stratification*: High *2012 Appropriate Use Criteria for Coronary Revascularization Focused Update: J Am Coll Cardiol. 6384;53(6):468-032. http://content.airportbarriers.com.aspx?articleid=[email or web address] Electronically Signed   By: Markus Daft M.D.   On: 03/29/2016 13:52   Dg Chest Port 1 View  Result Date: 04/01/2016 CLINICAL DATA:  Acute on chronic systolic congestive heart failure. Nonischemic cardiomyopathy. EXAM: PORTABLE CHEST 1 VIEW COMPARISON:  03/28/2016 FINDINGS: Stable mild cardiomegaly. AICD remains in stable position with tip overlying the left ventricle. No pneumothorax visualized. Mild opacity in the left costophrenic angle may be due to atelectasis or  small pleural effusion. Lung fields are otherwise clear. IMPRESSION: New opacity in left costophrenic angle, which may be due to atelectasis or pleural effusion. Electronically Signed   By: Earle Gell M.D.   On: 04/01/2016 20:57    PHYSICAL EXAM General: NAD. Sitting in chair HEENT: poor dentition Neck: JVP 8 cm. No thyromegaly or thyroid nodule.  Lungs: Clear.  CV: Lateral PMI.  Heart irregular S1/S2, no S3, no murmur.  No edema. Extremities warm.  Abdomen: Soft, nontender, no hepatosplenomegaly, no distention.  Neurologic: Alert and oriented x 3.  Psych: Flat affect. Extremities: No clubbing or cyanosis.   TELEMETRY:  Atrial fibrillation 90s-100s   ASSESSMENT AND PLAN: 46 yo with long history of nonischemic cardiomyopathy, HTN, DM, and CKD stage III presented with exertional dyspnea/CHF exacerbation and was noted to have had ICD discharge on 1/14 for polymorphic VT.  1. Atrial flutter with mild RVR => atrial fibrillation: Newly noted, was in atrial flutter at admission.  Now s/p AFL ablation 1/19.  Next went into atrial fibrillation, s/p DCVV 1/26.  Now back in atrial fibrillation, IV amiodarone restarted yesterday. - Long-term, think he will do better in NSR given severe HF.  Will add ranolazine this morning and repeat  DCCV (will get ECG for QT interval).  If he fails this cardioversion on ranolazine, will plan rate control strategy.  - INR 1.2. Loading coumadin. Continue heparin.  2. Acute on chronic systolic CHF -> cardiogenic shock: EF 04/02/2016  EF 20% on echo with moderate to severely decreased RV systolic function.  Possibly hypertensive cardiomyopathy.  Had cath around time of diagnosis in 2006 without significant coronary disease.  Cardiolite 1/17 showed EF 7% with inferolateral fixed defect, no ischemia (possible artifact).  TEE with severe biventricular dysfunction.  He developed cardiogenic shock with AKI after flutter ablation. Norepinephrine and dobutamine now off, co-ox 62%.   Volume status looks ok.  Creatinine trending down today.   - Would start on diuretic probably tomorrow (still good UOP and weight down with no diuretic yesterday), torsemide 80 mg daily.  - Continue Bidil to 2 tab tid. BP stable.  - Off b-blocker and ACE due to low ouput and AKI 3. Acute on CKD: Stage IV.  Creatinine trending down, good UOP.  As above, will start po diuretic tomorrow.  4. DM: Poor control => hgbA1c 13.6.  He does not know who manages this at home (?no PCP).  Has just been taking metformin.  On insulin in hospital, adjusting with help of diabetes nurse coordinator.  Need care management assistance for home health and to see if we can find him a PCP.   5. VT: Polymorphic VT on 1/14 with ICD discharge, VT on 01/11/16 with ICD discharge.  Suspect related to CHF exacerbation rather than ischemia.  -Quiescent on amio - Keep K> 4 and mag > 2.  K 4   Loralie Champagne MD 04/10/2016 8:58 AM

## 2016-04-10 NOTE — Progress Notes (Signed)
ANTICOAGULATION CONSULT NOTE - Follow Up Consult  Pharmacy Consult for heparin bridge to warfarin Indication: atrial fibrillation  No Known Allergies  Patient Measurements: Height: 5\' 10"  (177.8 cm) Weight: 222 lb 7.1 oz (100.9 kg) IBW/kg (Calculated) : 73 Heparin Dosing Weight: 96kg  Vital Signs: Temp: 98.9 F (37.2 C) (01/29 1100) Temp Source: Oral (01/29 1100) BP: 94/61 (01/29 1100) Pulse Rate: 104 (01/29 1100)  Labs:  Recent Labs  04/08/16 0458 04/09/16 0415 04/10/16 0521  HGB 14.6 14.2 15.0  HCT 43.9 42.4 44.9  PLT 261 260 293  LABPROT 15.0 15.0 15.4*  INR 1.17 1.18 1.21  HEPARINUNFRC 0.63 0.59 0.54  CREATININE 3.93* 3.95* 3.63*    Estimated Creatinine Clearance: 30.6 mL/min (by C-G formula based on SCr of 3.63 mg/dL (H)).   Medications:  Heparin @ 1500 units/hr  Assessment: 45yom transitioned from apixaban to heparin 1/21 for afib given acute renal failure and need for possible CRRT. Last apixaban dose 1/21 @ 0940. Used both APTT and heparin level with DOAC but they now appear to correlate. Transition to heparin levels only, therapeutic today. Successful DCCV 1/26.   Given poor renal function, transitioning to coumadin as opposed to going back on apixaban. INR remains subtherapeutic at 1.2. Increase dose done yesterday. CBC stable. Heparin remains at goal on 1500 units/hr.  Goal of Therapy:  INR goal 2-3 Heparin level 0.3-0.7 Monitor platelets by anticoagulation protocol: Yes   Plan:  1) Continue heparin at 1500 units/hr 2) Daily heparin level, and CBC 3) Warfarin 10mg  again tonight 4) Daily INR  Erin Hearing PharmD., BCPS Clinical Pharmacist Pager [phone] 04/10/2016 11:37 AM

## 2016-04-10 NOTE — Progress Notes (Signed)
CSW met with patient at bedside with paramedics to introduce and discuss paramedicine program. CSW discussed program and follow up in the heart failure clinic. Patient agreeable to referral and support provided by Paramedicine program post hospitalization. CSW will follow up with patient and paramedics post discharge through the paramedicine program. Raquel Sarna, [place], [place]

## 2016-04-10 NOTE — Progress Notes (Signed)
CARDIAC REHAB PHASE I   PRE:  Rate/Rhythm: 80 afib    BP: sitting 138/96    SaO2: 97 RA  MODE:  Ambulation: 1410 ft   POST:  Rate/Rhythm: 145 max, mostly 130s walking    BP: sitting 160/126     SaO2: 97 RA  Pt moving well without c/o. Denies SOB however HR elevated with walking (typical for him when in afib). Pt wanted to increase distance today, walking 3 laps. BP was elevated after walk. Pt continues to deny problems, return to recliner.  I went in 10 minutes later to recheck his BP but machine would not register correctly. I began discussing low sodium diet, daily wts, and reasoning for this. Pt seemed to be taking this in. Will continue education process. [phone]  [place], ACSM 04/10/2016 9:32 AM

## 2016-04-11 ENCOUNTER — Inpatient Hospital Stay (HOSPITAL_COMMUNITY): Payer: BLUE CROSS/BLUE SHIELD | Admitting: Certified Registered Nurse Anesthetist

## 2016-04-11 ENCOUNTER — Encounter (HOSPITAL_COMMUNITY): Payer: Self-pay | Admitting: *Deleted

## 2016-04-11 ENCOUNTER — Encounter (HOSPITAL_COMMUNITY)
Admission: EM | Disposition: A | Discharge status: Home / Self Care | Payer: Self-pay | Source: Home / Self Care | Attending: Cardiovascular Disease

## 2016-04-11 DIAGNOSIS — I4891 Unspecified atrial fibrillation: Secondary | ICD-10-CM

## 2016-04-11 HISTORY — PX: CARDIOVERSION: SHX1299

## 2016-04-11 LAB — BASIC METABOLIC PANEL
Anion gap: 12 (ref 5–15)
BUN: 63 mg/dL — ABNORMAL HIGH (ref 6–20)
CALCIUM: 9.3 mg/dL (ref 8.9–10.3)
CO2: 29 mmol/L (ref 22–32)
Chloride: 92 mmol/L — ABNORMAL LOW (ref 101–111)
Creatinine, Ser: 3.3 mg/dL — ABNORMAL HIGH (ref 0.61–1.24)
GFR, EST AFRICAN AMERICAN: 24 mL/min — AB (ref >60)
GFR, EST NON AFRICAN AMERICAN: 21 mL/min — AB (ref >60)
GLUCOSE: 123 mg/dL — AB (ref 65–99)
Potassium: 3.9 mmol/L (ref 3.5–5.1)
SODIUM: 133 mmol/L — AB (ref 135–145)

## 2016-04-11 LAB — CBC
HCT: 31.9 % — ABNORMAL LOW (ref 39.0–52.0)
HEMATOCRIT: 43.2 % (ref 39.0–52.0)
Hemoglobin: 10.5 g/dL — ABNORMAL LOW (ref 13.0–17.0)
Hemoglobin: 14.5 g/dL (ref 13.0–17.0)
MCH: 29.5 pg (ref 26.0–34.0)
MCH: 29.7 pg (ref 26.0–34.0)
MCHC: 32.9 g/dL (ref 30.0–36.0)
MCHC: 33.6 g/dL (ref 30.0–36.0)
MCV: 89.6 fL (ref 78.0–100.0)
MCV: 88.5 fL (ref 78.0–100.0)
PLATELETS: 200 10*3/uL (ref 150–400)
PLATELETS: 285 10*3/uL (ref 150–400)
RBC: 3.56 MIL/uL — AB (ref 4.22–5.81)
RBC: 4.88 MIL/uL (ref 4.22–5.81)
RDW: 14 % (ref 11.5–15.5)
RDW: 14.1 % (ref 11.5–15.5)
WBC: 4 10*3/uL (ref 4.0–10.5)
WBC: 5.4 10*3/uL (ref 4.0–10.5)

## 2016-04-11 LAB — COOXEMETRY PANEL
CARBOXYHEMOGLOBIN: 1.1 % (ref 0.5–1.5)
METHEMOGLOBIN: 0.8 % (ref 0.0–1.5)
O2 SAT: 61.9 %
Total hemoglobin: 16.9 g/dL — ABNORMAL HIGH (ref 12.0–16.0)

## 2016-04-11 LAB — PROTIME-INR
INR: 3.07
INR: 1.45
PROTHROMBIN TIME: 32.4 s — AB (ref 11.4–15.2)
Prothrombin Time: 17.7 seconds — ABNORMAL HIGH (ref 11.4–15.2)

## 2016-04-11 LAB — HEPARIN LEVEL (UNFRACTIONATED): Heparin Unfractionated: 0.59 IU/mL (ref 0.30–0.70)

## 2016-04-11 LAB — GLUCOSE, CAPILLARY
GLUCOSE-CAPILLARY: 96 mg/dL (ref 65–99)
GLUCOSE-CAPILLARY: 236 mg/dL — AB (ref 65–99)
GLUCOSE-CAPILLARY: 146 mg/dL — AB (ref 65–99)
Glucose-Capillary: 115 mg/dL — ABNORMAL HIGH (ref 65–99)

## 2016-04-11 SURGERY — CARDIOVERSION
Anesthesia: General

## 2016-04-11 MED ORDER — WARFARIN SODIUM 2.5 MG PO TABS
12.5000 mg | ORAL_TABLET | Freq: Once | ORAL | Status: AC
  Administered 2016-04-11: 12.5 mg via ORAL
  Filled 2016-04-11: qty 1

## 2016-04-11 MED ORDER — PROPOFOL 10 MG/ML IV BOLUS
INTRAVENOUS | Status: DC | PRN
  Administered 2016-04-11: 30 mg via INTRAVENOUS
  Administered 2016-04-11: 40 mg via INTRAVENOUS
  Administered 2016-04-11: 30 mg via INTRAVENOUS

## 2016-04-11 NOTE — Interval H&P Note (Signed)
History and Physical Interval Note:  04/11/2016 8:18 AM  Travis Bryan  has presented today for surgery, with the diagnosis of a fib  The various methods of treatment have been discussed with the patient and family. After consideration of risks, benefits and other options for treatment, the patient has consented to  Procedure(s): CARDIOVERSION (N/A) as a surgical intervention .  The patient's history has been reviewed, patient examined, no change in status, stable for surgery.  I have reviewed the patient's chart and labs.  Questions were answered to the patient's satisfaction.     Ruthellen Tippy [employer]

## 2016-04-11 NOTE — Transfer of Care (Signed)
Immediate Anesthesia Transfer of Care Note  Patient: Travis Bryan  Procedure(s) Performed: Procedure(s): CARDIOVERSION (N/A)  Patient Location: Endoscopy Unit  Anesthesia Type:General  Level of Consciousness: awake, alert  and oriented  Airway & Oxygen Therapy: Patient Spontanous Breathing  Post-op Assessment: Report given to RN and Post -op Vital signs reviewed and stable  Post vital signs: Reviewed and stable  Last Vitals:  Vitals:   04/11/16 0600 04/11/16 0744  BP: (!) 123/91 (!) 141/99  Pulse: (!) 48 81  Resp: (!) 23 (!) 23  Temp:  37.3 C    Last Pain:  Vitals:   04/11/16 0744  TempSrc: Oral  PainSc:       Patients Stated Pain Goal: 2 (62/82/41 7530)  Complications: No apparent anesthesia complications

## 2016-04-11 NOTE — Progress Notes (Signed)
CARDIAC REHAB PHASE I   PRE:  Rate/Rhythm: 72 SR  BP:  Supine:   Sitting: 134/93  Standing:    SaO2: 96%RA  MODE:  Ambulation: 1410 ft   POST:  Rate/Rhythm: 86 SR  BP:  Supine:   Sitting: 150/104  Standing:    SaO2: 97%RA 1355-1430 Pt walked 1410 ft on RA independently as I managed IV. Remained in NSR. Tried to re enforce ed as we walked. He could remember to call MD if he gains 3 pounds in a day but did not remember the 5 pounds in a week. Encouraged him to watch salt and discussed some healthy food choices. Back to recliner after walk. No complaints.   Graylon Good, RN BSN  04/11/2016 2:26 PM

## 2016-04-11 NOTE — Progress Notes (Signed)
Pt is in the procedure room during report time.

## 2016-04-11 NOTE — Anesthesia Postprocedure Evaluation (Addendum)
Anesthesia Post Note  Patient: Travis Bryan  Procedure(s) Performed: Procedure(s) (LRB): CARDIOVERSION (N/A)  Patient location during evaluation: Endoscopy Anesthesia Type: General Level of consciousness: awake, awake and alert and oriented Pain management: pain level controlled Vital Signs Assessment: post-procedure vital signs reviewed and stable Respiratory status: spontaneous breathing Cardiovascular status: blood pressure returned to baseline Postop Assessment: no signs of nausea or vomiting Anesthetic complications: no       Last Vitals:  Vitals:   04/11/16 0600 04/11/16 0744  BP: (!) 123/91 (!) 141/99  Pulse: (!) 48 81  Resp: (!) 23 (!) 23  Temp:  37.3 C    Last Pain:  Vitals:   04/11/16 0744  TempSrc: Oral  PainSc:                  Clearnce Sorrel

## 2016-04-11 NOTE — Hospital Discharge Follow-Up (Addendum)
[facility] [place]:  This Case Manager received call from Elissa Hefty, RN CM that patient needing a hospital follow-up appointment and to establish care with a PCP. Informed Debbie that clinic does not have any available appointments at this time. Will continue to follow in case appointment becomes available. Have [facility] for [facility] to see if they can take pt on.

## 2016-04-11 NOTE — Anesthesia Preprocedure Evaluation (Addendum)
Anesthesia Evaluation  Patient identified by MRN, date of birth, ID band Patient awake    Reviewed: Allergy & Precautions, NPO status , Patient's Chart, lab work & pertinent test results  History of Anesthesia Complications Negative for: history of anesthetic complications  Airway Mallampati: II  TM Distance: >3 FB Neck ROM: Full    Dental  (+) Dental Advisory Given   Pulmonary former smoker,    breath sounds clear to auscultation       Cardiovascular hypertension, Pt. on medications + Past MI and +CHF  + dysrhythmias + Cardiac Defibrillator  Rhythm:Irregular     Neuro/Psych    GI/Hepatic   Endo/Other  diabetes, Type 2, Insulin Dependent  Renal/GU      Musculoskeletal   Abdominal   Peds  Hematology   Anesthesia Other Findings   Reproductive/Obstetrics                            Anesthesia Physical Anesthesia Plan  ASA: III  Anesthesia Plan: General   Post-op Pain Management:    Induction: Intravenous  Airway Management Planned: Mask  Additional Equipment: None  Intra-op Plan:   Post-operative Plan:   Informed Consent: I have reviewed the patients History and Physical, chart, labs and discussed the procedure including the risks, benefits and alternatives for the proposed anesthesia with the patient or authorized representative who has indicated his/her understanding and acceptance.   Dental advisory given  Plan Discussed with: Anesthesiologist and Surgeon  Anesthesia Plan Comments:        Anesthesia Quick Evaluation

## 2016-04-11 NOTE — Procedures (Signed)
Electrical Cardioversion Procedure Note Travis Bryan [id number] [birth year]  Procedure: Electrical Cardioversion Indications:  Atrial Fibrillation.  He is on heparin gtt/warfarin.   Procedure Details Consent: Risks of procedure as well as the alternatives and risks of each were explained to the (patient/caregiver).  Consent for procedure obtained. Time Out: Verified patient identification, verified procedure, site/side was marked, verified correct patient position, special equipment/implants available, medications/allergies/relevent history reviewed, required imaging and test results available.  Performed  Patient placed on cardiac monitor, pulse oximetry, supplemental oxygen as necessary.  Sedation given: Propofol per anesthesia Pacer pads placed anterior and posterior chest.  Cardioverted 1 time(s).  Cardioverted at [name].  Evaluation Findings: Post procedure EKG shows: NSR Complications: None Patient did tolerate procedure well.   Travis Bryan 04/11/2016, 8:25 AM

## 2016-04-11 NOTE — Progress Notes (Signed)
ANTICOAGULATION CONSULT NOTE - Follow Up Consult  Pharmacy Consult for heparin bridge to warfarin Indication: atrial fibrillation  Allergies  Allergen Reactions  . No Known Allergies     Patient Measurements: Height: 5\' 10"  (177.8 cm) Weight: 222 lb (100.7 kg) IBW/kg (Calculated) : 73 Heparin Dosing Weight: 96kg  Vital Signs: Temp: 97.6 F (36.4 C) (01/30 1100) Temp Source: Oral (01/30 1100) BP: 117/79 (01/30 1100) Pulse Rate: 76 (01/30 1200)  Labs:  Recent Labs  04/09/16 0415 04/10/16 0521 04/11/16 0421 04/11/16 0422 04/11/16 0603  HGB 14.2 15.0  --  10.5* 14.5  HCT 42.4 44.9  --  31.9* 43.2  PLT 260 293  --  200 285  LABPROT 15.0 15.4* 32.4*  --  17.7*  INR 1.18 1.21 3.07  --  1.45  HEPARINUNFRC 0.59 0.54 <0.10*  --  0.59  CREATININE 3.95* 3.63*  --  3.30*  --     Estimated Creatinine Clearance: 33.6 mL/min (by C-G formula based on SCr of 3.3 mg/dL (H)).   Medications:  Heparin @ 1500 units/hr  Assessment: 45yom transitioned from apixaban to heparin 1/21 for afib given acute renal failure and need for possible CRRT. Last apixaban dose 1/21 @ 0940. Used both APTT and heparin level with DOAC but they now appear to correlate. Transition to heparin levels only, therapeutic today. Successful DCCV 1/26.   Given poor renal function, transitioning to coumadin as opposed to going back on apixaban.   INR remains subtherapeutic at 1.45. Increase dose tonight. CBC stable. Heparin remains at goal on 1500 units/hr.  Goal of Therapy:  INR goal 2-3 Heparin level 0.3-0.7 Monitor platelets by anticoagulation protocol: Yes   Plan:  1) Continue heparin at 1500 units/hr 2) Daily heparin level, and CBC 3) Warfarin 12.5mg  tonight 4) Daily INR  Erin Hearing PharmD., BCPS Clinical Pharmacist Pager [phone] 04/11/2016 3:09 PM

## 2016-04-11 NOTE — H&P (View-Only) (Signed)
Patient ID: Travis Bryan, male   DOB: 1971/01/17, 46 y.o.   MRN: [id number]   SUBJECTIVE:   Admitted with acute/chronic systolic CHF and atrial flutter. Underwent TEE and AFL ablation 1/19.  Post-procedure developed AKI and cardiogenic shock, started on dobutamine and norepinephrine.  He subsequently went into atrial flutter with controlled rate on amiodarone.   Dobutamine stopped 1/26 and underwent DCCV of atrial fib.  IV lasix stopped 1/27  Went back into AF 1/27. Rate controlled. Co-ox 62% today.  Weight down 1 lb.  No dyspnea.   Creatinine 3.5->3.6->3.9->3.9->3.63  EF 15-20% with moderate to severe RV HK  Cardiolite: EF 7%, inferolateral fixed defect no ischemia.   Scheduled Meds: . insulin aspart  0-20 Units Subcutaneous TID WC  . insulin aspart  6 Units Subcutaneous TID WC  . insulin glargine  25 Units Subcutaneous QHS  . isosorbide-hydrALAZINE  2 tablet Oral TID  . magnesium oxide  400 mg Oral Daily  . mouth rinse  15 mL Mouth Rinse BID  . ranolazine  500 mg Oral BID  . torsemide  80 mg Oral Daily  . Warfarin - Pharmacist Dosing Inpatient   Does not apply q1800   Continuous Infusions: . amiodarone 30 mg/hr (04/10/16 0116)  . heparin 1,500 Units/hr (04/10/16 0510)  . norepinephrine (LEVOPHED) Adult infusion Stopped (04/04/16 0335)   PRN Meds:.acetaminophen, ondansetron (ZOFRAN) IV, oxyCODONE-acetaminophen, sodium chloride flush    Vitals:   04/10/16 0000 04/10/16 0400 04/10/16 0500 04/10/16 0700  BP: 111/80 (!) 137/98  124/84  Pulse: 65 (!) 145  (!) 44  Resp: 16 (!) 21  18  Temp: 98.7 F (37.1 C)   98.2 F (36.8 C)  TempSrc: Oral   Oral  SpO2: 96% 93%  95%  Weight:   222 lb 7.1 oz (100.9 kg)   Height:        Intake/Output Summary (Last 24 hours) at 04/10/16 0858 Last data filed at 04/10/16 0700  Gross per 24 hour  Intake          1117.65 ml  Output             2700 ml  Net         -1582.35 ml    LABS: Basic Metabolic Panel:  Recent Labs   04/09/16 0415 04/10/16 0521  NA 132* 132*  K 4.0 4.0  CL 89* 91*  CO2 29 28  GLUCOSE 123* 129*  BUN 68* 69*  CREATININE 3.95* 3.63*  CALCIUM 9.9 9.9   Liver Function Tests: No results for input(s): AST, ALT, ALKPHOS, BILITOT, PROT, ALBUMIN in the last 72 hours. No results for input(s): LIPASE, AMYLASE in the last 72 hours. CBC:  Recent Labs  04/09/16 0415 04/10/16 0521  WBC 5.0 5.4  HGB 14.2 15.0  HCT 42.4 44.9  MCV 88.9 89.1  PLT 260 293   Cardiac Enzymes: No results for input(s): CKTOTAL, CKMB, CKMBINDEX, TROPONINI in the last 72 hours. BNP: Invalid input(s): POCBNP D-Dimer: No results for input(s): DDIMER in the last 72 hours. Hemoglobin A1C: No results for input(s): HGBA1C in the last 72 hours. Fasting Lipid Panel: No results for input(s): CHOL, HDL, LDLCALC, TRIG, CHOLHDL, LDLDIRECT in the last 72 hours. Thyroid Function Tests: No results for input(s): TSH, T4TOTAL, T3FREE, THYROIDAB in the last 72 hours.  Invalid input(s): FREET3 Anemia Panel: No results for input(s): VITAMINB12, FOLATE, FERRITIN, TIBC, IRON, RETICCTPCT in the last 72 hours.  RADIOLOGY: Dg Chest 2 View  Result Date: 03/28/2016 CLINICAL DATA:  Shortness of breath EXAM: CHEST  2 VIEW COMPARISON:  04/25/2007 FINDINGS: Left-sided single lead pacing device with lead over right ventricle. Moderate cardiomegaly without overt failure. No acute infiltrate, consolidation, or pleural effusion. No pneumothorax peer IMPRESSION: Cardiomegaly without overt failure.  No acute infiltrate Electronically Signed   By: Donavan Foil M.D.   On: 03/28/2016 01:55   Nm Myocar Multi W/spect W/wall Motion / Ef  Result Date: 03/29/2016 CLINICAL DATA:  History of cardiomyopathy and atrial flutter. Acute on chronic systolic CHF. EXAM: MYOCARDIAL IMAGING WITH SPECT (REST AND PHARMACOLOGIC-STRESS) GATED LEFT VENTRICULAR WALL MOTION STUDY LEFT VENTRICULAR EJECTION FRACTION TECHNIQUE: Standard myocardial SPECT imaging was  performed after resting intravenous injection of 10 mCi Tc-30m tetrofosmin. Subsequently, intravenous infusion of Lexiscan was performed under the supervision of the Cardiology staff. At peak effect of the drug, 30 mCi Tc-10m tetrofosmin was injected intravenously and standard myocardial SPECT imaging was performed. Quantitative gated imaging was also performed to evaluate left ventricular wall motion, and estimate left ventricular ejection fraction. COMPARISON:  None. FINDINGS: Perfusion: There is a large fixed defect involving the inferolateral wall compatible with an infarct. There is no evidence to suggest reversibility or ischemia. Left ventricle is dilated. Wall Motion: There is markedly abnormal wall motion throughout the left ventricle. Severe hypokinesia throughout the left ventricle and difficult to exclude areas of paradoxical motion due to the minimal wall motion. Left Ventricular Ejection Fraction: 7 % End diastolic volume 161 ml End systolic volume 096 ml IMPRESSION: 1. Large infarct involving the inferolateral wall. No evidence for ischemia or reversibility. 2. Markedly abnormal wall motion throughout the left ventricle. Severe hypokinesia throughout the left ventricle. 3. Left ventricular ejection fraction is 7%. 4. Non invasive risk stratification*: High *2012 Appropriate Use Criteria for Coronary Revascularization Focused Update: J Am Coll Cardiol. 0454;09(8):119-147. http://[email or web address]?[email or web address] Electronically Signed   By: Markus Daft M.D.   On: 03/29/2016 13:52   Dg Chest Port 1 View  Result Date: 04/01/2016 CLINICAL DATA:  Acute on chronic systolic congestive heart failure. Nonischemic cardiomyopathy. EXAM: PORTABLE CHEST 1 VIEW COMPARISON:  03/28/2016 FINDINGS: Stable mild cardiomegaly. AICD remains in stable position with tip overlying the left ventricle. No pneumothorax visualized. Mild opacity in the left costophrenic angle may be due to atelectasis or  small pleural effusion. Lung fields are otherwise clear. IMPRESSION: New opacity in left costophrenic angle, which may be due to atelectasis or pleural effusion. Electronically Signed   By: Earle Gell M.D.   On: 04/01/2016 20:57    PHYSICAL EXAM General: NAD. Sitting in chair HEENT: poor dentition Neck: JVP 8 cm. No thyromegaly or thyroid nodule.  Lungs: Clear.  CV: Lateral PMI.  Heart irregular S1/S2, no S3, no murmur.  No edema. Extremities warm.  Abdomen: Soft, nontender, no hepatosplenomegaly, no distention.  Neurologic: Alert and oriented x 3.  Psych: Flat affect. Extremities: No clubbing or cyanosis.   TELEMETRY:  Atrial fibrillation 90s-100s   ASSESSMENT AND PLAN: 46 yo with long history of nonischemic cardiomyopathy, HTN, DM, and CKD stage III presented with exertional dyspnea/CHF exacerbation and was noted to have had ICD discharge on 1/14 for polymorphic VT.  1. Atrial flutter with mild RVR => atrial fibrillation: Newly noted, was in atrial flutter at admission.  Now s/p AFL ablation 1/19.  Next went into atrial fibrillation, s/p DCVV 1/26.  Now back in atrial fibrillation, IV amiodarone restarted yesterday. - Long-term, think he will do better in NSR given severe HF.  Will add ranolazine this morning and repeat  DCCV (will get ECG for QT interval).  If he fails this cardioversion on ranolazine, will plan rate control strategy.  - INR 1.2. Loading coumadin. Continue heparin.  2. Acute on chronic systolic CHF -> cardiogenic shock: EF 04/02/2016  EF 20% on echo with moderate to severely decreased RV systolic function.  Possibly hypertensive cardiomyopathy.  Had cath around time of diagnosis in 2006 without significant coronary disease.  Cardiolite 1/17 showed EF 7% with inferolateral fixed defect, no ischemia (possible artifact).  TEE with severe biventricular dysfunction.  He developed cardiogenic shock with AKI after flutter ablation. Norepinephrine and dobutamine now off, co-ox 62%.   Volume status looks ok.  Creatinine trending down today.   - Would start on diuretic probably tomorrow (still good UOP and weight down with no diuretic yesterday), torsemide 80 mg daily.  - Continue Bidil to 2 tab tid. BP stable.  - Off b-blocker and ACE due to low ouput and AKI 3. Acute on CKD: Stage IV.  Creatinine trending down, good UOP.  As above, will start po diuretic tomorrow.  4. DM: Poor control => hgbA1c 13.6.  He does not know who manages this at home (?no PCP).  Has just been taking metformin.  On insulin in hospital, adjusting with help of diabetes nurse coordinator.  Need care management assistance for home health and to see if we can find him a PCP.   5. VT: Polymorphic VT on 1/14 with ICD discharge, VT on 01/11/16 with ICD discharge.  Suspect related to CHF exacerbation rather than ischemia.  -Quiescent on amio - Keep K> 4 and mag > 2.  K 4   Loralie Champagne MD 04/10/2016 8:58 AM

## 2016-04-11 NOTE — Progress Notes (Signed)
Cm had spoken w transitional care [facility]. Pt had refused appt being set up. They are looking for appt again but need md to stress importance of following up w pcp. Will cont to follow.

## 2016-04-11 NOTE — Progress Notes (Signed)
Patient ID: Travis Bryan, male   DOB: 17-Feb-1971, 46 y.o.   MRN: [id number]   SUBJECTIVE:   Admitted with acute/chronic systolic CHF and atrial flutter. Underwent TEE and AFL ablation 1/19.  Post-procedure developed AKI and cardiogenic shock, started on dobutamine and norepinephrine.  He subsequently went into atrial flutter with controlled rate on amiodarone.   Dobutamine stopped 1/26 and underwent DCCV of atrial fib.  IV lasix stopped 1/27  Went back into AF 1/27. I cardioverted him back to NSR this morning after starting ranolazine. Co-ox 62% today.  No dyspnea, walking in halls.  Creatinine 3.5->3.6->3.9->3.9->3.63->3.3.   EF 15-20% with moderate to severe RV HK  Cardiolite: EF 7%, inferolateral fixed defect no ischemia.   Scheduled Meds: . [MAR Hold] insulin aspart  0-20 Units Subcutaneous TID WC  . [MAR Hold] insulin aspart  6 Units Subcutaneous TID WC  . [MAR Hold] insulin glargine  25 Units Subcutaneous QHS  . [MAR Hold] isosorbide-hydrALAZINE  2 tablet Oral TID  . [MAR Hold] magnesium oxide  400 mg Oral Daily  . [MAR Hold] mouth rinse  15 mL Mouth Rinse BID  . [MAR Hold] ranolazine  500 mg Oral BID  . [MAR Hold] sodium chloride flush  3 mL Intravenous Q12H  . [MAR Hold] torsemide  80 mg Oral Daily  . [MAR Hold] Warfarin - Pharmacist Dosing Inpatient   Does not apply q1800   Continuous Infusions: . sodium chloride 500 mL (04/11/16 0748)  . amiodarone 30 mg/hr (04/10/16 2154)  . heparin 1,500 Units/hr (04/10/16 2044)  . [MAR Hold] norepinephrine (LEVOPHED) Adult infusion Stopped (04/04/16 0335)   PRN Meds:.[MAR Hold] acetaminophen, [MAR Hold] ondansetron (ZOFRAN) IV, [MAR Hold] oxyCODONE-acetaminophen, [MAR Hold] sodium chloride flush, [MAR Hold] sodium chloride flush    Vitals:   04/11/16 0600 04/11/16 0744 04/11/16 0829 04/11/16 0830  BP: (!) 123/91 (!) 141/99 (!) 138/95 136/88  Pulse: (!) 48 81 77   Resp: (!) 23 (!) 23 (!) 24   Temp:  99.1 F (37.3 C)      TempSrc:  Oral    SpO2: 94% 92% 95%   Weight:      Height:        Intake/Output Summary (Last 24 hours) at 04/11/16 0835 Last data filed at 04/11/16 0600  Gross per 24 hour  Intake           867.35 ml  Output             1200 ml  Net          -332.65 ml    LABS: Basic Metabolic Panel:  Recent Labs  04/10/16 0521 04/11/16 0422  NA 132* 133*  K 4.0 3.9  CL 91* 92*  CO2 28 29  GLUCOSE 129* 123*  BUN 69* 63*  CREATININE 3.63* 3.30*  CALCIUM 9.9 9.3   Liver Function Tests: No results for input(s): AST, ALT, ALKPHOS, BILITOT, PROT, ALBUMIN in the last 72 hours. No results for input(s): LIPASE, AMYLASE in the last 72 hours. CBC:  Recent Labs  04/11/16 0422 04/11/16 0603  WBC 4.0 5.4  HGB 10.5* 14.5  HCT 31.9* 43.2  MCV 89.6 88.5  PLT 200 285   Cardiac Enzymes: No results for input(s): CKTOTAL, CKMB, CKMBINDEX, TROPONINI in the last 72 hours. BNP: Invalid input(s): POCBNP D-Dimer: No results for input(s): DDIMER in the last 72 hours. Hemoglobin A1C: No results for input(s): HGBA1C in the last 72 hours. Fasting Lipid Panel: No results for input(s): CHOL, HDL, LDLCALC,  TRIG, CHOLHDL, LDLDIRECT in the last 72 hours. Thyroid Function Tests: No results for input(s): TSH, T4TOTAL, T3FREE, THYROIDAB in the last 72 hours.  Invalid input(s): FREET3 Anemia Panel: No results for input(s): VITAMINB12, FOLATE, FERRITIN, TIBC, IRON, RETICCTPCT in the last 72 hours.  RADIOLOGY: Dg Chest 2 View  Result Date: 03/28/2016 CLINICAL DATA:  Shortness of breath EXAM: CHEST  2 VIEW COMPARISON:  04/25/2007 FINDINGS: Left-sided single lead pacing device with lead over right ventricle. Moderate cardiomegaly without overt failure. No acute infiltrate, consolidation, or pleural effusion. No pneumothorax peer IMPRESSION: Cardiomegaly without overt failure.  No acute infiltrate Electronically Signed   By: Donavan Foil M.D.   On: 03/28/2016 01:55   Nm Myocar Multi W/spect W/wall Motion  / Ef  Result Date: 03/29/2016 CLINICAL DATA:  History of cardiomyopathy and atrial flutter. Acute on chronic systolic CHF. EXAM: MYOCARDIAL IMAGING WITH SPECT (REST AND PHARMACOLOGIC-STRESS) GATED LEFT VENTRICULAR WALL MOTION STUDY LEFT VENTRICULAR EJECTION FRACTION TECHNIQUE: Standard myocardial SPECT imaging was performed after resting intravenous injection of 10 mCi Tc-71m tetrofosmin. Subsequently, intravenous infusion of Lexiscan was performed under the supervision of the Cardiology staff. At peak effect of the drug, 30 mCi Tc-51m tetrofosmin was injected intravenously and standard myocardial SPECT imaging was performed. Quantitative gated imaging was also performed to evaluate left ventricular wall motion, and estimate left ventricular ejection fraction. COMPARISON:  None. FINDINGS: Perfusion: There is a large fixed defect involving the inferolateral wall compatible with an infarct. There is no evidence to suggest reversibility or ischemia. Left ventricle is dilated. Wall Motion: There is markedly abnormal wall motion throughout the left ventricle. Severe hypokinesia throughout the left ventricle and difficult to exclude areas of paradoxical motion due to the minimal wall motion. Left Ventricular Ejection Fraction: 7 % End diastolic volume 811 ml End systolic volume 914 ml IMPRESSION: 1. Large infarct involving the inferolateral wall. No evidence for ischemia or reversibility. 2. Markedly abnormal wall motion throughout the left ventricle. Severe hypokinesia throughout the left ventricle. 3. Left ventricular ejection fraction is 7%. 4. Non invasive risk stratification*: High *2012 Appropriate Use Criteria for Coronary Revascularization Focused Update: J Am Coll Cardiol. 7829;56(2):130-865. http://[email or web address]?[email or web address] Electronically Signed   By: Markus Daft M.D.   On: 03/29/2016 13:52   Dg Chest Port 1 View  Result Date: 04/01/2016 CLINICAL DATA:  Acute on chronic systolic  congestive heart failure. Nonischemic cardiomyopathy. EXAM: PORTABLE CHEST 1 VIEW COMPARISON:  03/28/2016 FINDINGS: Stable mild cardiomegaly. AICD remains in stable position with tip overlying the left ventricle. No pneumothorax visualized. Mild opacity in the left costophrenic angle may be due to atelectasis or small pleural effusion. Lung fields are otherwise clear. IMPRESSION: New opacity in left costophrenic angle, which may be due to atelectasis or pleural effusion. Electronically Signed   By: Earle Gell M.D.   On: 04/01/2016 20:57    PHYSICAL EXAM General: NAD. Sitting in chair HEENT: poor dentition Neck: JVP 8 cm. No thyromegaly or thyroid nodule.  Lungs: Clear.  CV: Lateral PMI.  Heart irregular S1/S2, no S3, no murmur.  No edema. Extremities warm.  Abdomen: Soft, nontender, no hepatosplenomegaly, no distention.  Neurologic: Alert and oriented x 3.  Psych: Flat affect. Extremities: No clubbing or cyanosis.   TELEMETRY:  Atrial fibrillation 90s-100s => NSR 70s  ASSESSMENT AND PLAN: 46 yo with long history of nonischemic cardiomyopathy, HTN, DM, and CKD stage III presented with exertional dyspnea/CHF exacerbation and was noted to have had ICD discharge on 1/14 for polymorphic VT.  1. Atrial  flutter with mild RVR => atrial fibrillation: Newly noted, was in atrial flutter at admission.  Now s/p AFL ablation 1/19.  Next went into atrial fibrillation, s/p DCVV 1/26.  Back in atrial fibrillation 1/27.  Ranolazine started.  DCCV to NSR 1/30. - Long-term, think he will do better in NSR given severe HF. He is now on ranolazine + amiodarone, QTc 517 msec this morning (ok with amiodarone use and IVCD).  If he fails this cardioversion on ranolazine, will plan rate control strategy.  - INR 1.45. Loading coumadin. Continue heparin.  2. Acute on chronic systolic CHF -> cardiogenic shock: EF 04/02/2016  EF 20% on echo with moderate to severely decreased RV systolic function.  Possibly hypertensive  cardiomyopathy.  Had cath around time of diagnosis in 2006 without significant coronary disease.  Cardiolite 1/17 showed EF 7% with inferolateral fixed defect, no ischemia (possible artifact).  TEE with severe biventricular dysfunction.  He developed cardiogenic shock with AKI after flutter ablation. Norepinephrine and dobutamine now off, co-ox 62%.  Volume status looks ok.  Creatinine trending down today.   - He will start torsemide 80 mg daily today.  - Continue Bidil to 2 tab tid. BP stable.  - Off b-blocker and ACE due to low ouput and AKI 3. Acute on CKD: Stage IV.  Creatinine trending down, good UOP.  As above, will start po diuretic today.   4. DM: Poor control => hgbA1c 13.6.  He does not know who manages this at home (?no PCP).  Has just been taking metformin.  On insulin in hospital, adjusting with help of diabetes nurse coordinator.  Need care management assistance for home health and to see if we can find him a PCP.   5. VT: Polymorphic VT on 1/14 with ICD discharge, VT on 01/11/16 with ICD discharge.  Suspect related to CHF exacerbation rather than ischemia.  - Quiescent on amio - Keep K> 4 and mag > 2.  K 4  Loralie Champagne MD 04/11/2016 8:35 AM

## 2016-04-12 ENCOUNTER — Encounter (HOSPITAL_COMMUNITY): Payer: Self-pay | Admitting: Cardiology

## 2016-04-12 LAB — CBC
HEMATOCRIT: 39.9 % (ref 39.0–52.0)
Hemoglobin: 13.3 g/dL (ref 13.0–17.0)
MCH: 29.7 pg (ref 26.0–34.0)
MCHC: 33.3 g/dL (ref 30.0–36.0)
MCV: 89.1 fL (ref 78.0–100.0)
PLATELETS: 276 10*3/uL (ref 150–400)
RBC: 4.48 MIL/uL (ref 4.22–5.81)
RDW: 13.9 % (ref 11.5–15.5)
WBC: 4.3 10*3/uL (ref 4.0–10.5)

## 2016-04-12 LAB — BASIC METABOLIC PANEL
Anion gap: 12 (ref 5–15)
BUN: 64 mg/dL — AB (ref 6–20)
CALCIUM: 9.6 mg/dL (ref 8.9–10.3)
CO2: 27 mmol/L (ref 22–32)
Chloride: 94 mmol/L — ABNORMAL LOW (ref 101–111)
Creatinine, Ser: 3.81 mg/dL — ABNORMAL HIGH (ref 0.61–1.24)
GFR calc Af Amer: 21 mL/min — ABNORMAL LOW (ref >60)
GFR, EST NON AFRICAN AMERICAN: 18 mL/min — AB (ref >60)
GLUCOSE: 117 mg/dL — AB (ref 65–99)
POTASSIUM: 4.1 mmol/L (ref 3.5–5.1)
SODIUM: 133 mmol/L — AB (ref 135–145)

## 2016-04-12 LAB — PROTIME-INR
INR: 1.61
PROTHROMBIN TIME: 19.4 s — AB (ref 11.4–15.2)

## 2016-04-12 LAB — COOXEMETRY PANEL
Carboxyhemoglobin: 1.5 % (ref 0.5–1.5)
METHEMOGLOBIN: 0.6 % (ref 0.0–1.5)
O2 SAT: 62.3 %
TOTAL HEMOGLOBIN: 18.6 g/dL — AB (ref 12.0–16.0)

## 2016-04-12 LAB — MAGNESIUM: MAGNESIUM: 2.3 mg/dL (ref 1.7–2.4)

## 2016-04-12 LAB — HEPARIN LEVEL (UNFRACTIONATED): HEPARIN UNFRACTIONATED: 0.66 [IU]/mL (ref 0.30–0.70)

## 2016-04-12 LAB — GLUCOSE, CAPILLARY
GLUCOSE-CAPILLARY: 100 mg/dL — AB (ref 65–99)
GLUCOSE-CAPILLARY: 82 mg/dL (ref 65–99)
GLUCOSE-CAPILLARY: 76 mg/dL (ref 65–99)
Glucose-Capillary: 98 mg/dL (ref 65–99)

## 2016-04-12 MED ORDER — AMIODARONE HCL 200 MG PO TABS
400.0000 mg | ORAL_TABLET | Freq: Two times a day (BID) | ORAL | Status: DC
  Administered 2016-04-12 – 2016-04-14 (×5): 400 mg via ORAL
  Filled 2016-04-12 (×5): qty 2

## 2016-04-12 MED ORDER — TORSEMIDE 20 MG PO TABS
60.0000 mg | ORAL_TABLET | Freq: Every day | ORAL | Status: DC
  Administered 2016-04-13 – 2016-04-14 (×2): 60 mg via ORAL
  Filled 2016-04-12 (×2): qty 3

## 2016-04-12 MED ORDER — WARFARIN SODIUM 7.5 MG PO TABS
15.0000 mg | ORAL_TABLET | Freq: Once | ORAL | Status: AC
  Administered 2016-04-12: 15 mg via ORAL
  Filled 2016-04-12: qty 2

## 2016-04-12 NOTE — Progress Notes (Signed)
ANTICOAGULATION CONSULT NOTE - Follow Up Consult  Pharmacy Consult for heparin bridge to warfarin Indication: atrial fibrillation  Allergies  Allergen Reactions  . No Known Allergies     Patient Measurements: Height: 5\' 10"  (177.8 cm) Weight: 224 lb 3.2 oz (101.7 kg) IBW/kg (Calculated) : 73 Heparin Dosing Weight: 96kg  Vital Signs: Temp: 98.2 F (36.8 C) (01/31 0848) Temp Source: Oral (01/31 0848) BP: 142/101 (01/31 0848) Pulse Rate: 64 (01/31 0848)  Labs:  Recent Labs  04/10/16 0521 04/11/16 0421 04/11/16 0422 04/11/16 0603 04/12/16 0410  HGB 15.0  --  10.5* 14.5 13.3  HCT 44.9  --  31.9* 43.2 39.9  PLT 293  --  200 285 276  LABPROT 15.4* 32.4*  --  17.7* 19.4*  INR 1.21 3.07  --  1.45 1.61  HEPARINUNFRC 0.54 <0.10*  --  0.59 0.66  CREATININE 3.63*  --  3.30*  --  3.81*    Estimated Creatinine Clearance: 29.3 mL/min (by C-G formula based on SCr of 3.81 mg/dL (H)).   Medications:  Heparin @ 1500 units/hr  Assessment: 45yom transitioned from apixaban to heparin 1/21 for afib given acute renal failure and need for possible CRRT. Last apixaban dose 1/21 @ 0940. Used both APTT and heparin level with DOAC but they now appear to correlate. Transition to heparin levels only, therapeutic today. Successful DCCV 1/26.   Given poor renal function, transitioning to coumadin as opposed to going back on apixaban.   INR remains subtherapeutic at 1.6. Increase dose tonight. CBC stable. Heparin remains at goal on 1500 units/hr.  Goal of Therapy:  INR goal 2-3 Heparin level 0.3-0.7 Monitor platelets by anticoagulation protocol: Yes   Plan:  1) Continue heparin at 1500 units/hr 2) Daily heparin level, and CBC 3) Warfarin 15mg  tonight 4) Daily INR  Erin Hearing PharmD., BCPS Clinical Pharmacist Pager [phone] 04/12/2016 9:41 AM

## 2016-04-12 NOTE — Progress Notes (Signed)
CARDIAC REHAB PHASE I   PRE:  Rate/Rhythm: 69 SR    BP: sitting 131/86    SaO2: 92 RA  MODE:  Ambulation: 1880 ft   POST:  Rate/Rhythm: 84 SR    BP: sitting 146/108      SaO2: 96 RA  Tolerated well, no c/o. Increased distance. Discussed low sodium, daily wts, when to call MD, DM diet, walking daily. He is starting to get the basics, not sure how much he will actually apply. Set up HF video and also showed him pages from HF booklet. Encouraged more walking. [place], ACSM 04/12/2016 11:42 AM

## 2016-04-12 NOTE — Progress Notes (Signed)
Patient ID: Travis Bryan, male   DOB: 12-03-1970, 46 y.o.   MRN: [id number]   SUBJECTIVE:   Admitted with acute/chronic systolic CHF and atrial flutter. Underwent TEE and AFL ablation 1/19.  Post-procedure developed AKI and cardiogenic shock, started on dobutamine and norepinephrine.  He subsequently went into atrial flutter with controlled rate on amiodarone.   Dobutamine stopped 1/26 and underwent DCCV of atrial fib.  IV lasix stopped 1/27  Went back into AF 1/27. S/p DCCV 04/11/16 with return to NSR. Now on amio and ranolazine.    Feeling good this am.  No SOB walking around room or halls.   Coox 62.3% this am. Creatinine 3.3 -> 3.8. CVP 4-5 seated in chair.   INR 1.6  EF 15-20% with moderate to severe RV HK  Cardiolite: EF 7%, inferolateral fixed defect no ischemia.   Scheduled Meds: . insulin aspart  0-20 Units Subcutaneous TID WC  . insulin aspart  6 Units Subcutaneous TID WC  . insulin glargine  25 Units Subcutaneous QHS  . isosorbide-hydrALAZINE  2 tablet Oral TID  . magnesium oxide  400 mg Oral Daily  . mouth rinse  15 mL Mouth Rinse BID  . ranolazine  500 mg Oral BID  . sodium chloride flush  3 mL Intravenous Q12H  . torsemide  80 mg Oral Daily  . Warfarin - Pharmacist Dosing Inpatient   Does not apply q1800   Continuous Infusions: . sodium chloride 500 mL (04/11/16 0748)  . amiodarone 30 mg/hr (04/12/16 0045)  . heparin 1,500 Units/hr (04/12/16 0626)  . norepinephrine (LEVOPHED) Adult infusion Stopped (04/04/16 0335)   PRN Meds:.acetaminophen, ondansetron (ZOFRAN) IV, oxyCODONE-acetaminophen, sodium chloride flush, sodium chloride flush    Vitals:   04/12/16 0200 04/12/16 0359 04/12/16 0400 04/12/16 0600  BP: (!) 140/101 (!) 150/104 (!) 144/104 (!) 132/99  Pulse: 65 70 70 61  Resp: (!) 28 20 (!) 24 15  Temp:  98.1 F (36.7 C)    TempSrc:  Oral    SpO2: 95% 95% 94% 97%  Weight:  224 lb 3.2 oz (101.7 kg)    Height:        Intake/Output Summary (Last 24  hours) at 04/12/16 0849 Last data filed at 04/12/16 0600  Gross per 24 hour  Intake           1727.4 ml  Output             2100 ml  Net           -372.6 ml    LABS: Basic Metabolic Panel:  Recent Labs  04/11/16 0422 04/12/16 0410  NA 133* 133*  K 3.9 4.1  CL 92* 94*  CO2 29 27  GLUCOSE 123* 117*  BUN 63* 64*  CREATININE 3.30* 3.81*  CALCIUM 9.3 9.6   Liver Function Tests: No results for input(s): AST, ALT, ALKPHOS, BILITOT, PROT, ALBUMIN in the last 72 hours. No results for input(s): LIPASE, AMYLASE in the last 72 hours. CBC:  Recent Labs  04/11/16 0603 04/12/16 0410  WBC 5.4 4.3  HGB 14.5 13.3  HCT 43.2 39.9  MCV 88.5 89.1  PLT 285 276   Cardiac Enzymes: No results for input(s): CKTOTAL, CKMB, CKMBINDEX, TROPONINI in the last 72 hours. BNP: Invalid input(s): POCBNP D-Dimer: No results for input(s): DDIMER in the last 72 hours. Hemoglobin A1C: No results for input(s): HGBA1C in the last 72 hours. Fasting Lipid Panel: No results for input(s): CHOL, HDL, LDLCALC, TRIG, CHOLHDL, LDLDIRECT in the last  72 hours. Thyroid Function Tests: No results for input(s): TSH, T4TOTAL, T3FREE, THYROIDAB in the last 72 hours.  Invalid input(s): FREET3 Anemia Panel: No results for input(s): VITAMINB12, FOLATE, FERRITIN, TIBC, IRON, RETICCTPCT in the last 72 hours.  RADIOLOGY: Dg Chest 2 View  Result Date: 03/28/2016 CLINICAL DATA:  Shortness of breath EXAM: CHEST  2 VIEW COMPARISON:  04/25/2007 FINDINGS: Left-sided single lead pacing device with lead over right ventricle. Moderate cardiomegaly without overt failure. No acute infiltrate, consolidation, or pleural effusion. No pneumothorax peer IMPRESSION: Cardiomegaly without overt failure.  No acute infiltrate Electronically Signed   By: Donavan Foil M.D.   On: 03/28/2016 01:55   Nm Myocar Multi W/spect W/wall Motion / Ef  Result Date: 03/29/2016 CLINICAL DATA:  History of cardiomyopathy and atrial flutter. Acute on  chronic systolic CHF. EXAM: MYOCARDIAL IMAGING WITH SPECT (REST AND PHARMACOLOGIC-STRESS) GATED LEFT VENTRICULAR WALL MOTION STUDY LEFT VENTRICULAR EJECTION FRACTION TECHNIQUE: Standard myocardial SPECT imaging was performed after resting intravenous injection of 10 mCi Tc-49m tetrofosmin. Subsequently, intravenous infusion of Lexiscan was performed under the supervision of the Cardiology staff. At peak effect of the drug, 30 mCi Tc-52m tetrofosmin was injected intravenously and standard myocardial SPECT imaging was performed. Quantitative gated imaging was also performed to evaluate left ventricular wall motion, and estimate left ventricular ejection fraction. COMPARISON:  None. FINDINGS: Perfusion: There is a large fixed defect involving the inferolateral wall compatible with an infarct. There is no evidence to suggest reversibility or ischemia. Left ventricle is dilated. Wall Motion: There is markedly abnormal wall motion throughout the left ventricle. Severe hypokinesia throughout the left ventricle and difficult to exclude areas of paradoxical motion due to the minimal wall motion. Left Ventricular Ejection Fraction: 7 % End diastolic volume 283 ml End systolic volume 662 ml IMPRESSION: 1. Large infarct involving the inferolateral wall. No evidence for ischemia or reversibility. 2. Markedly abnormal wall motion throughout the left ventricle. Severe hypokinesia throughout the left ventricle. 3. Left ventricular ejection fraction is 7%. 4. Non invasive risk stratification*: High *2012 Appropriate Use Criteria for Coronary Revascularization Focused Update: J Am Coll Cardiol. 9476;54(6):503-546. http://content.[email or web address]?articleid=[email or web address] Electronically Signed   By: Markus Daft M.D.   On: 03/29/2016 13:52   Dg Chest Port 1 View  Result Date: 04/01/2016 CLINICAL DATA:  Acute on chronic systolic congestive heart failure. Nonischemic cardiomyopathy. EXAM: PORTABLE CHEST 1 VIEW COMPARISON:   03/28/2016 FINDINGS: Stable mild cardiomegaly. AICD remains in stable position with tip overlying the left ventricle. No pneumothorax visualized. Mild opacity in the left costophrenic angle may be due to atelectasis or small pleural effusion. Lung fields are otherwise clear. IMPRESSION: New opacity in left costophrenic angle, which may be due to atelectasis or pleural effusion. Electronically Signed   By: Earle Gell M.D.   On: 04/01/2016 20:57   PHYSICAL EXAM General: Seated in chair. NAD.  HEENT: poor dentition Neck: JVP 8 cm. No thyromegaly or thyroid nodule.  Lungs: Mildly diminished basilar sounds.  CV: Lateral PMI.  Heart irregular S1/S2, no S3, no murmur.   Abdomen: Soft, NT, ND, no HSM. No bruits or masses. +BS  Neurologic: Alert and oriented x 3.  Psych: Flat affect. Extremities: No clubbing or cyanosis. No peripheral edema.   TELEMETRY:  Reviewed, Remains in NSR 67s   ASSESSMENT AND PLAN: 46 yo with long history of nonischemic cardiomyopathy, HTN, DM, and CKD stage III presented with exertional dyspnea/CHF exacerbation and was noted to have had ICD discharge on 1/14 for polymorphic VT.   1.  Atrial flutter with mild RVR => atrial fibrillation: Newly noted, was in atrial flutter at admission.  Now s/p AFL ablation 1/19.  Next went into atrial fibrillation, s/p DCVV 1/26.  Back in atrial fibrillation 1/27.  Ranolazine started.  s/p DCCV to NSR 1/30. - Continue ranolazine + amiodarone, QTc 517 msec 04/11/16 (OK with amiodarone use and IVCD).   - If goes back into Afib, will favor rate control.  - INR 1.61. Loading coumadin. Continue heparin.  2. Acute on chronic systolic CHF -> cardiogenic shock: EF 04/02/2016  EF 20% on echo with moderate to severely decreased RV systolic function.  Possibly hypertensive cardiomyopathy.  Had cath around time of diagnosis in 2006 without significant coronary disease.  Cardiolite 1/17 showed EF 7% with inferolateral fixed defect, no ischemia (possible  artifact).  TEE with severe biventricular dysfunction.  He developed cardiogenic shock with AKI after flutter ablation. Norepinephrine and dobutamine now off, co-ox 62%.   - Volume status stable to dry.  - Creatinine back up after starting torsemide 80 mg daily  yesterday. Will hold today and start on 60 mg torsemide daily tomorrow.  - Continue Bidil to 2 tab tid. BP stable.  - Off b-blocker and ACE due to low ouput and AKI 3. Acute on CKD: Stage IV.   - Creatinine back up with resumption of po meds. Adjustment as above.  4. DM: Poor control => hgbA1c 13.6.  He does not know who manages this at home (?no PCP).  Has just been taking metformin.  On insulin in hospital, adjusting with help of diabetes nurse coordinator.  - Care management following and has PCP.    5. VT: Polymorphic VT on 1/14 with ICD discharge, VT on 01/11/16 with ICD discharge.  Suspect related to CHF exacerbation rather than ischemia.  - Quiescent on amio - Keep K> 4 and mag > 2.  K 4.1 this am. Check Mg.   Shirley Friar, PA-C  04/12/2016 8:49 AM   Advanced Heart Failure Team Pager [phone] (M-F; 7a - 4p)  Please contact [facility] for night-coverage after hours (4p -7a ) and weekends on [email or web address]  Patient seen with PA, agree with the above note.  He is remaining in NSR after DCCV yesterday.  Creatinine up to 3.8 today, CVP low.  - Hold torsemide today, possibly restart tomorrow at 60 mg daily (lower dose).  - Transition to po amiodarone.  - Home when INR therapeutic (bridging with heparin gtt).   Loralie Champagne 04/12/2016 9:22 AM

## 2016-04-12 NOTE — Progress Notes (Signed)
Pt ambulated around unit x3. No acute or respiratory distress noted. Remained in SR with rate in 90's. No complaints. Text page sent to on-call regarding elevated BP's on last 2 readings. Awaiting response. Pt is asymptomatic. BP readings taken prior to ambulation

## 2016-04-12 NOTE — Progress Notes (Signed)
Cm has gotten pt appt at [facility] for feb 8,2018 at 10:15am. Pt will need to be at clinic by 10am.

## 2016-04-13 LAB — COMPREHENSIVE METABOLIC PANEL
ALT: 19 U/L (ref 17–63)
ANION GAP: 12 (ref 5–15)
AST: 22 U/L (ref 15–41)
Albumin: 3.2 g/dL — ABNORMAL LOW (ref 3.5–5.0)
Alkaline Phosphatase: 112 U/L (ref 38–126)
BUN: 61 mg/dL — ABNORMAL HIGH (ref 6–20)
CHLORIDE: 94 mmol/L — AB (ref 101–111)
CO2: 28 mmol/L (ref 22–32)
Calcium: 9.7 mg/dL (ref 8.9–10.3)
Creatinine, Ser: 3.47 mg/dL — ABNORMAL HIGH (ref 0.61–1.24)
GFR calc non Af Amer: 20 mL/min — ABNORMAL LOW (ref >60)
GFR, EST AFRICAN AMERICAN: 23 mL/min — AB (ref >60)
Glucose, Bld: 92 mg/dL (ref 65–99)
Potassium: 3.8 mmol/L (ref 3.5–5.1)
SODIUM: 134 mmol/L — AB (ref 135–145)
Total Bilirubin: 1.2 mg/dL (ref 0.3–1.2)
Total Protein: 7.9 g/dL (ref 6.5–8.1)

## 2016-04-13 LAB — CBC WITH DIFFERENTIAL/PLATELET
BASOS PCT: 1 %
Basophils Absolute: 0 10*3/uL (ref 0.0–0.1)
EOS ABS: 0.3 10*3/uL (ref 0.0–0.7)
EOS PCT: 7 %
HCT: 39.5 % (ref 39.0–52.0)
Hemoglobin: 13.2 g/dL (ref 13.0–17.0)
LYMPHS ABS: 1.4 10*3/uL (ref 0.7–4.0)
Lymphocytes Relative: 37 %
MCH: 29.8 pg (ref 26.0–34.0)
MCHC: 33.4 g/dL (ref 30.0–36.0)
MCV: 89.2 fL (ref 78.0–100.0)
Monocytes Absolute: 0.4 10*3/uL (ref 0.1–1.0)
Monocytes Relative: 9 %
Neutro Abs: 1.8 10*3/uL (ref 1.7–7.7)
Neutrophils Relative %: 46 %
PLATELETS: 273 10*3/uL (ref 150–400)
RBC: 4.43 MIL/uL (ref 4.22–5.81)
RDW: 13.9 % (ref 11.5–15.5)
WBC: 3.8 10*3/uL — AB (ref 4.0–10.5)

## 2016-04-13 LAB — GLUCOSE, CAPILLARY
GLUCOSE-CAPILLARY: 132 mg/dL — AB (ref 65–99)
GLUCOSE-CAPILLARY: 140 mg/dL — AB (ref 65–99)
Glucose-Capillary: 98 mg/dL (ref 65–99)
Glucose-Capillary: 100 mg/dL — ABNORMAL HIGH (ref 65–99)

## 2016-04-13 LAB — COOXEMETRY PANEL
CARBOXYHEMOGLOBIN: 1.5 % (ref 0.5–1.5)
METHEMOGLOBIN: 0.5 % (ref 0.0–1.5)
O2 SAT: 69 %
Total hemoglobin: 13.2 g/dL (ref 12.0–16.0)

## 2016-04-13 LAB — HEPARIN LEVEL (UNFRACTIONATED)
HEPARIN UNFRACTIONATED: 0.66 [IU]/mL (ref 0.30–0.70)
Heparin Unfractionated: 0.76 IU/mL — ABNORMAL HIGH (ref 0.30–0.70)

## 2016-04-13 LAB — PROTIME-INR
INR: 1.86
Prothrombin Time: 21.6 seconds — ABNORMAL HIGH (ref 11.4–15.2)

## 2016-04-13 MED ORDER — POTASSIUM CHLORIDE CRYS ER 20 MEQ PO TBCR
20.0000 meq | EXTENDED_RELEASE_TABLET | Freq: Every day | ORAL | Status: DC
  Administered 2016-04-13 – 2016-04-14 (×2): 20 meq via ORAL
  Filled 2016-04-13 (×2): qty 1

## 2016-04-13 MED ORDER — WARFARIN SODIUM 2.5 MG PO TABS
12.5000 mg | ORAL_TABLET | Freq: Once | ORAL | Status: AC
  Administered 2016-04-13: 12.5 mg via ORAL
  Filled 2016-04-13: qty 1

## 2016-04-13 NOTE — Progress Notes (Signed)
Patient ID: Travis Bryan, male   DOB: Jun 08, 1970, 46 y.o.   MRN: [id number]   SUBJECTIVE:   Admitted with acute/chronic systolic CHF and atrial flutter. Underwent TEE and AFL ablation 1/19.  Post-procedure developed AKI and cardiogenic shock, started on dobutamine and norepinephrine.  He subsequently went into atrial flutter with controlled rate on amiodarone.   Dobutamine stopped 1/26 and underwent DCCV of atrial fib.  IV lasix stopped 1/27  Went back into AF 1/27. S/p DCCV 04/11/16 with return to NSR. Now on amio and ranolazine.    Feeling OK this morning.  Denies SOB or lightheadedness.   Coox 69%. Creatinine 3.3 -> 3.8 -> 3.4. CVP 9  INR 1.86  EF 15-20% with moderate to severe RV HK  Cardiolite: EF 7%, inferolateral fixed defect no ischemia.   Scheduled Meds: . amiodarone  400 mg Oral BID  . insulin aspart  0-20 Units Subcutaneous TID WC  . insulin aspart  6 Units Subcutaneous TID WC  . insulin glargine  25 Units Subcutaneous QHS  . isosorbide-hydrALAZINE  2 tablet Oral TID  . magnesium oxide  400 mg Oral Daily  . mouth rinse  15 mL Mouth Rinse BID  . ranolazine  500 mg Oral BID  . sodium chloride flush  3 mL Intravenous Q12H  . torsemide  60 mg Oral Daily  . warfarin  12.5 mg Oral ONCE-1800  . Warfarin - Pharmacist Dosing Inpatient   Does not apply q1800   Continuous Infusions: . sodium chloride 500 mL (04/11/16 0748)  . heparin 1,400 Units/hr (04/13/16 0421)   PRN Meds:.acetaminophen, ondansetron (ZOFRAN) IV, oxyCODONE-acetaminophen, sodium chloride flush, sodium chloride flush    Vitals:   04/13/16 0000 04/13/16 0300 04/13/16 0400 04/13/16 0700  BP: (!) 136/93 (!) 147/99 (!) 147/99 (!) 142/102  Pulse: 62 65 80 65  Resp: 14 18 (!) 22 19  Temp:  98.4 F (36.9 C)  97.6 F (36.4 C)  TempSrc:  Oral  Oral  SpO2: 97% 96% 97% 95%  Weight:  223 lb 8 oz (101.4 kg)    Height:        Intake/Output Summary (Last 24 hours) at 04/13/16 0854 Last data filed at 04/13/16  0800  Gross per 24 hour  Intake              810 ml  Output             2050 ml  Net            -1240 ml    LABS: Basic Metabolic Panel:  Recent Labs  04/12/16 0410 04/12/16 0930 04/13/16 0340  NA 133*  --  134*  K 4.1  --  3.8  CL 94*  --  94*  CO2 27  --  28  GLUCOSE 117*  --  92  BUN 64*  --  61*  CREATININE 3.81*  --  3.47*  CALCIUM 9.6  --  9.7  MG  --  2.3  --    Liver Function Tests:  Recent Labs  04/13/16 0340  AST 22  ALT 19  ALKPHOS 112  BILITOT 1.2  PROT 7.9  ALBUMIN 3.2*   No results for input(s): LIPASE, AMYLASE in the last 72 hours. CBC:  Recent Labs  04/12/16 0410 04/13/16 0340  WBC 4.3 3.8*  NEUTROABS  --  1.8  HGB 13.3 13.2  HCT 39.9 39.5  MCV 89.1 89.2  PLT 276 273   Cardiac Enzymes: No results for input(s): CKTOTAL,  CKMB, CKMBINDEX, TROPONINI in the last 72 hours. BNP: Invalid input(s): POCBNP D-Dimer: No results for input(s): DDIMER in the last 72 hours. Hemoglobin A1C: No results for input(s): HGBA1C in the last 72 hours. Fasting Lipid Panel: No results for input(s): CHOL, HDL, LDLCALC, TRIG, CHOLHDL, LDLDIRECT in the last 72 hours. Thyroid Function Tests: No results for input(s): TSH, T4TOTAL, T3FREE, THYROIDAB in the last 72 hours.  Invalid input(s): FREET3 Anemia Panel: No results for input(s): VITAMINB12, FOLATE, FERRITIN, TIBC, IRON, RETICCTPCT in the last 72 hours.  RADIOLOGY: Dg Chest 2 View  Result Date: 03/28/2016 CLINICAL DATA:  Shortness of breath EXAM: CHEST  2 VIEW COMPARISON:  04/25/2007 FINDINGS: Left-sided single lead pacing device with lead over right ventricle. Moderate cardiomegaly without overt failure. No acute infiltrate, consolidation, or pleural effusion. No pneumothorax peer IMPRESSION: Cardiomegaly without overt failure.  No acute infiltrate Electronically Signed   By: Donavan Foil M.D.   On: 03/28/2016 01:55   Nm Myocar Multi W/spect W/wall Motion / Ef  Result Date: 03/29/2016 CLINICAL DATA:   History of cardiomyopathy and atrial flutter. Acute on chronic systolic CHF. EXAM: MYOCARDIAL IMAGING WITH SPECT (REST AND PHARMACOLOGIC-STRESS) GATED LEFT VENTRICULAR WALL MOTION STUDY LEFT VENTRICULAR EJECTION FRACTION TECHNIQUE: Standard myocardial SPECT imaging was performed after resting intravenous injection of 10 mCi Tc-79m tetrofosmin. Subsequently, intravenous infusion of Lexiscan was performed under the supervision of the Cardiology staff. At peak effect of the drug, 30 mCi Tc-5m tetrofosmin was injected intravenously and standard myocardial SPECT imaging was performed. Quantitative gated imaging was also performed to evaluate left ventricular wall motion, and estimate left ventricular ejection fraction. COMPARISON:  None. FINDINGS: Perfusion: There is a large fixed defect involving the inferolateral wall compatible with an infarct. There is no evidence to suggest reversibility or ischemia. Left ventricle is dilated. Wall Motion: There is markedly abnormal wall motion throughout the left ventricle. Severe hypokinesia throughout the left ventricle and difficult to exclude areas of paradoxical motion due to the minimal wall motion. Left Ventricular Ejection Fraction: 7 % End diastolic volume 092 ml End systolic volume 330 ml IMPRESSION: 1. Large infarct involving the inferolateral wall. No evidence for ischemia or reversibility. 2. Markedly abnormal wall motion throughout the left ventricle. Severe hypokinesia throughout the left ventricle. 3. Left ventricular ejection fraction is 7%. 4. Non invasive risk stratification*: High *2012 Appropriate Use Criteria for Coronary Revascularization Focused Update: J Am Coll Cardiol. 0762;26(3):335-456. http://content.[email or web address]?[email or web address] Electronically Signed   By: Markus Daft M.D.   On: 03/29/2016 13:52   Dg Chest Port 1 View  Result Date: 04/01/2016 CLINICAL DATA:  Acute on chronic systolic congestive heart failure. Nonischemic  cardiomyopathy. EXAM: PORTABLE CHEST 1 VIEW COMPARISON:  03/28/2016 FINDINGS: Stable mild cardiomegaly. AICD remains in stable position with tip overlying the left ventricle. No pneumothorax visualized. Mild opacity in the left costophrenic angle may be due to atelectasis or small pleural effusion. Lung fields are otherwise clear. IMPRESSION: New opacity in left costophrenic angle, which may be due to atelectasis or pleural effusion. Electronically Signed   By: Earle Gell M.D.   On: 04/01/2016 20:57   PHYSICAL EXAM CVP 9 General: Seated in chair.   HEENT: Poor dentition.  Neck: JVP 8 cm. No thyromegaly or thyroid nodule.  Lungs: Mildly diminished basilar sounds.  CV: Lateral PMI.  Heart irregular S1/S2, no S3, no murmur.   Abdomen: Soft, NT, ND, no HSM. No bruits or masses. +BS  Neurologic: Alert and oriented x 3.  Psych: Flat affect. Extremities: No clubbing  or cyanosis. No peripheral edema.   TELEMETRY:  Reviewed, Remains in NSR 60s   ASSESSMENT AND PLAN: 46 yo with long history of nonischemic cardiomyopathy, HTN, DM, and CKD stage III presented with exertional dyspnea/CHF exacerbation and was noted to have had ICD discharge on 1/14 for polymorphic VT.   1. Atrial flutter with mild RVR => atrial fibrillation: Newly noted, was in atrial flutter at admission.  Now s/p AFL ablation 1/19.  Next went into atrial fibrillation, s/p DCVV 1/26.  Back in atrial fibrillation 1/27.  Ranolazine started.  s/p DCCV to NSR 1/30. - Continue ranolazine + amiodarone, QTc 517 msec 04/11/16 (OK with amiodarone use and IVCD).   - If goes back into Afib, will favor rate control.  - INR 1.86. Loading coumadin. Continue heparin.   2. Acute on chronic systolic CHF -> cardiogenic shock: EF 04/02/2016  EF 20% on echo with moderate to severely decreased RV systolic function.  Possibly hypertensive cardiomyopathy.  Had cath around time of diagnosis in 2006 without significant coronary disease.  Cardiolite 1/17 showed EF  7% with inferolateral fixed defect, no ischemia (possible artifact).  TEE with severe biventricular dysfunction.  He developed cardiogenic shock with AKI after flutter ablation.  - Coox 69% off pressor support - Volume status trending back up.  Starting torsemide back after holding yesterday. Will use 60 mg daily for now.  - Creatinine 3.4 this am. Diuretics held yesterday.  - Continue Bidil to 2 tab tid. BP stable.  - Off b-blocker and ACE due to low ouput and AKI - Spiro contraindicated with Creatinine > 2.5 at baseline.  3. Acute on CKD Stage III.   - Elevated.  Resuming torsemide as above.  Baseline on admission ~2.0. Currently 3.4.  4. DM: Poor control => hgbA1c 13.6.  He does not know who manages this at home (?no PCP).  Has just been taking metformin.  On insulin in hospital, adjusting with help of diabetes nurse coordinator.  - Care management following and has PCP.   No change to current plan.  5. VT: Polymorphic VT on 1/14 with ICD discharge, VT on 01/11/16 with ICD discharge.  Suspect related to CHF exacerbation rather than ischemia.  - Quiescent on amio - Keep K> 4 and mag > 2.   Creatinine remains elevated from baseline.  INR trending up with coumadin with load. Possibly home tomorrow with close follow up.   Shirley Friar, PA-C  04/13/2016 8:54 AM   Advanced Heart Failure Team Pager [phone] (M-F; 7a - 4p)  Please contact [facility] for night-coverage after hours (4p -7a ) and weekends on [email or web address]  Patient seen with PA, agree with the above note.  He remains in NSR.  Co-ox and CVP ok today, creatinine down to 3.4.  INR 1.86.  - Continue heparin/warfarin overlap for likely 1 more day.  - Start back on torsemide 60 mg daily today.  - Possible discharge tomorrow.   Loralie Champagne 04/13/2016 9:13 AM

## 2016-04-13 NOTE — Progress Notes (Signed)
ANTICOAGULATION CONSULT NOTE - Follow Up Consult  Pharmacy Consult for heparin Indication: atrial fibrillation  Labs:  Recent Labs  04/10/16 0521  04/11/16 0422 04/11/16 0603 04/12/16 0410 04/13/16 0340  HGB 15.0  --  10.5* 14.5 13.3 13.2  HCT 44.9  --  31.9* 43.2 39.9 39.5  PLT 293  --  200 285 276 273  LABPROT 15.4*  < >  --  17.7* 19.4* 21.6*  INR 1.21  < >  --  1.45 1.61 1.86  HEPARINUNFRC 0.54  < >  --  0.59 0.66 0.76*  CREATININE 3.63*  --  3.30*  --  3.81*  --   < > = values in this interval not displayed.   Assessment: 46yo male now above goal on heparin after several levels at goal though had been trending up.  Goal of Therapy:  Heparin level 0.3-0.7 units/ml   Plan:  Will decrease heparin gtt slightly to 1400 units/hr and check level in [place], PharmD, BCPS  04/13/2016,4:19 AM

## 2016-04-13 NOTE — Progress Notes (Signed)
[place] for heparin bridge to warfarin Indication: atrial fibrillation  Allergies  Allergen Reactions  . No Known Allergies     Patient Measurements: Height: 5\' 10"  (177.8 cm) Weight: 223 lb 8 oz (101.4 kg) IBW/kg (Calculated) : 73 Heparin Dosing Weight: 96kg  Vital Signs: Temp: 97.6 F (36.4 C) (02/01 0700) Temp Source: Oral (02/01 0700) BP: 142/102 (02/01 0700) Pulse Rate: 65 (02/01 0700)  Labs:  Recent Labs  04/11/16 0422 04/11/16 0603 04/12/16 0410 04/13/16 0340  HGB 10.5* 14.5 13.3 13.2  HCT 31.9* 43.2 39.9 39.5  PLT 200 285 276 273  LABPROT  --  17.7* 19.4* 21.6*  INR  --  1.45 1.61 1.86  HEPARINUNFRC  --  0.59 0.66 0.76*  CREATININE 3.30*  --  3.81* 3.47*    Estimated Creatinine Clearance: 32.1 mL/min (by C-G formula based on SCr of 3.47 mg/dL (H)).   Medications:  Heparin @ 1500 units/hr  Assessment: 45yom transitioned from apixaban to heparin 1/21 for afib given acute renal failure and need for possible CRRT. Last apixaban dose 1/21 @ 0940. Used both APTT and heparin level with DOAC but they now appear to correlate. Transition to heparin levels only, therapeutic today. Successful DCCV 1/26.   Given poor renal function, transitioning to coumadin as opposed to going back on apixaban.   INR remains subtherapeutic at 1.8.  Heparin above goal on 1500 units/hr this am will adjust. No bleeding issues noted. CBC stable.  Goal of Therapy:  INR goal 2-3 Heparin level 0.3-0.7 Monitor platelets by anticoagulation protocol: Yes   Plan:  1) Continue heparin at 1500 units/hr 2) Daily heparin level, and CBC 3) Warfarin 15mg  tonight 4) Daily INR  Erin Hearing PharmD., BCPS Clinical Pharmacist Pager [phone] 04/13/2016 8:16 AM

## 2016-04-13 NOTE — Progress Notes (Signed)
CARDIAC REHAB PHASE I   PRE:  Rate/Rhythm: 68 SR    BP: sitting 132/88    SaO2: 96 RA  MODE:  Ambulation: 2350 ft   POST:  Rate/Rhythm: 95 SR    BP: sitting 145/109     SaO2: 96 RA  Tolerated well. No c/o. Pt not highly motivated, needs encouragement. I continued to reinforce education, which he can perform teach back. Encouraged more walking. [phone]   [place], ACSM 04/13/2016 11:20 AM

## 2016-04-14 ENCOUNTER — Other Ambulatory Visit (HOSPITAL_COMMUNITY): Payer: Self-pay | Admitting: Cardiology

## 2016-04-14 LAB — GLUCOSE, CAPILLARY
Glucose-Capillary: 107 mg/dL — ABNORMAL HIGH (ref 65–99)
Glucose-Capillary: 94 mg/dL (ref 65–99)

## 2016-04-14 LAB — BASIC METABOLIC PANEL
Anion gap: 14 (ref 5–15)
BUN: 55 mg/dL — AB (ref 6–20)
CHLORIDE: 97 mmol/L — AB (ref 101–111)
CO2: 23 mmol/L (ref 22–32)
CREATININE: 3.51 mg/dL — AB (ref 0.61–1.24)
Calcium: 10.1 mg/dL (ref 8.9–10.3)
GFR calc Af Amer: 23 mL/min — ABNORMAL LOW (ref >60)
GFR calc non Af Amer: 20 mL/min — ABNORMAL LOW (ref >60)
GLUCOSE: 118 mg/dL — AB (ref 65–99)
Potassium: 4.6 mmol/L (ref 3.5–5.1)
Sodium: 134 mmol/L — ABNORMAL LOW (ref 135–145)

## 2016-04-14 LAB — COOXEMETRY PANEL
Carboxyhemoglobin: 1.5 % (ref 0.5–1.5)
Methemoglobin: 0.5 % (ref 0.0–1.5)
O2 SAT: 66 %
TOTAL HEMOGLOBIN: 14.4 g/dL (ref 12.0–16.0)

## 2016-04-14 LAB — HEPARIN LEVEL (UNFRACTIONATED): HEPARIN UNFRACTIONATED: 0.65 [IU]/mL (ref 0.30–0.70)

## 2016-04-14 LAB — PROTIME-INR
INR: 2.17
Prothrombin Time: 24.6 seconds — ABNORMAL HIGH (ref 11.4–15.2)

## 2016-04-14 LAB — CBC
HEMATOCRIT: 44.1 % (ref 39.0–52.0)
Hemoglobin: 14.6 g/dL (ref 13.0–17.0)
MCH: 29.7 pg (ref 26.0–34.0)
MCHC: 33.1 g/dL (ref 30.0–36.0)
MCV: 89.6 fL (ref 78.0–100.0)
PLATELETS: 270 10*3/uL (ref 150–400)
RBC: 4.92 MIL/uL (ref 4.22–5.81)
RDW: 13.9 % (ref 11.5–15.5)
WBC: 4.4 10*3/uL (ref 4.0–10.5)

## 2016-04-14 MED ORDER — INSULIN ASPART 100 UNIT/ML ~~LOC~~ SOLN: 0.0000 [IU] | Freq: Three times a day (TID) | SUBCUTANEOUS | 11 refills | Status: DC

## 2016-04-14 MED ORDER — HYDRALAZINE HCL 25 MG PO TABS: 75.0000 mg | ORAL_TABLET | Freq: Three times a day (TID) | ORAL | 3 refills | Status: DC

## 2016-04-14 MED ORDER — CARVEDILOL 3.125 MG PO TABS: 3.1250 mg | ORAL_TABLET | Freq: Two times a day (BID) | ORAL | 6 refills | Status: DC

## 2016-04-14 MED ORDER — MAGNESIUM OXIDE 400 (241.3 MG) MG PO TABS: 400.0000 mg | ORAL_TABLET | Freq: Every day | ORAL | 6 refills | Status: DC

## 2016-04-14 MED ORDER — INSULIN GLARGINE 100 UNIT/ML ~~LOC~~ SOLN: 25.0000 [IU] | Freq: Every day | SUBCUTANEOUS | 11 refills | Status: DC

## 2016-04-14 MED ORDER — ISOSORB DINITRATE-HYDRALAZINE 20-37.5 MG PO TABS: 2.0000 | ORAL_TABLET | Freq: Three times a day (TID) | ORAL | 6 refills | Status: DC

## 2016-04-14 MED ORDER — AMIODARONE HCL 200 MG PO TABS: ORAL_TABLET | ORAL | 3 refills | Status: DC

## 2016-04-14 MED ORDER — CARVEDILOL 3.125 MG PO TABS
3.1250 mg | ORAL_TABLET | Freq: Two times a day (BID) | ORAL | Status: DC
  Administered 2016-04-14: 3.125 mg via ORAL
  Filled 2016-04-14: qty 1

## 2016-04-14 MED ORDER — INSULIN STARTER KIT- SYRINGES (ENGLISH): 1.0000 | Freq: Once | 0 refills | Status: AC

## 2016-04-14 MED ORDER — RANOLAZINE ER 500 MG PO TB12: 500.0000 mg | ORAL_TABLET | Freq: Two times a day (BID) | ORAL | 6 refills | Status: DC

## 2016-04-14 MED ORDER — WARFARIN SODIUM 10 MG PO TABS: 10.0000 mg | ORAL_TABLET | Freq: Every day | ORAL | Status: DC

## 2016-04-14 MED ORDER — ISOSORBIDE MONONITRATE ER 30 MG PO TB24: 90.0000 mg | ORAL_TABLET | Freq: Every day | ORAL | 3 refills | Status: DC

## 2016-04-14 MED ORDER — INSULIN ASPART 100 UNIT/ML ~~LOC~~ SOLN: 6.0000 [IU] | Freq: Three times a day (TID) | SUBCUTANEOUS | 11 refills | Status: DC

## 2016-04-14 MED ORDER — WARFARIN SODIUM 10 MG PO TABS: 10.0000 mg | ORAL_TABLET | Freq: Every day | ORAL | 6 refills | Status: DC

## 2016-04-14 MED ORDER — TORSEMIDE 20 MG PO TABS: 60.0000 mg | ORAL_TABLET | Freq: Every day | ORAL | 6 refills | Status: DC

## 2016-04-14 MED ORDER — INSULIN STARTER KIT- SYRINGES (ENGLISH)
1.0000 | Freq: Once | Status: AC
  Administered 2016-04-14: 1
  Filled 2016-04-14: qty 1

## 2016-04-14 MED ORDER — POTASSIUM CHLORIDE CRYS ER 20 MEQ PO TBCR: 20.0000 meq | EXTENDED_RELEASE_TABLET | Freq: Every day | ORAL | 6 refills | Status: DC

## 2016-04-14 MED FILL — hydrALAZINE HCL 25 MG TABS: 25 | 30 days supply | Qty: 270 | Fill #0

## 2016-04-14 MED FILL — ISOSORBIDE MN ER 30 MG TAB: 30 | 30 days supply | Qty: 90 | Fill #0

## 2016-04-14 MED FILL — KLOR-CON M20 TABLET: 20 | 30 days supply | Qty: 30 | Fill #0

## 2016-04-14 MED FILL — CARVEDILOL 3.125 MG TABLET: 3.125 | 30 days supply | Qty: 60 | Fill #0

## 2016-04-14 MED FILL — AMIODARONE HCL 200 MG TAB: 200 | 30 days supply | Qty: 62 | Fill #0

## 2016-04-14 MED FILL — LANTUS 100 UNITS/ML VIAL: 100 | 28 days supply | Qty: 10 | Fill #0

## 2016-04-14 MED FILL — WARFARIN NA 10 MG TAB: 10 | 30 days supply | Qty: 40 | Fill #0

## 2016-04-14 MED FILL — TORSEMIDE 20 MG TABLET: 20 | 30 days supply | Qty: 90 | Fill #0

## 2016-04-14 MED FILL — RANEXA ER 500 MG TABLET: 500 | 30 days supply | Qty: 60 | Fill #0

## 2016-04-14 MED FILL — NovoLOG 100 UNIT/ML SOLN: 100 | 17 days supply | Qty: 10 | Fill #0

## 2016-04-14 NOTE — Progress Notes (Signed)
CARDIAC REHAB PHASE I   PRE:  Rate/Rhythm: 76 SR  BP:  Sitting: 134/93        SaO2: 95 RA  MODE:  Ambulation: 1410 ft   POST:  Rate/Rhythm: 99 SR  BP:  Sitting: 156/104         SaO2: 94 RA  Pt ambulated 1410 ft on RA, IV, steady gait, tolerated well with no complaints. Pt BP elevated. Reviewed education, pt able to perform teach back. Pt states he has insurance with BCBS, discussed possibility of phase 2 cardiac rehab, pt expressed interest, will send referral to [facility] per pt request. Reviewed diabetes diet handout, pt may benefit from diabetes coordinator consultation for further diabetes education prior to d/c. Pt to recliner after walk, call bell within reach.     [phone] Lenna Sciara, RN, BSN 04/14/2016 10:15 AM

## 2016-04-14 NOTE — Progress Notes (Signed)
Dc instructions given to patient at this time.  Pt verbalized understanding of all instructions.  No s/s of any acute distress noted.

## 2016-04-14 NOTE — Care Management Note (Addendum)
Case Management Note  Patient Details  Name: Travis Bryan MRN: [id number] Date of Birth: 04/22/1970  Subjective/Objective:    Adm w chf, arrthymia                Action/Plan:lives w family, no pcp pta   Expected Discharge Date:                  Expected Discharge Plan:  Home/Self Care  In-House Referral:     Discharge planning Services  CM Consult, Medication Assistance, [facility], [facility], Follow-up appt scheduled  Post Acute Care Choice:    Choice offered to:     DME Arranged:    DME Agency:     HH Arranged:    [place] Agency:     Status of Service:  Completed, signed off  If discussed at [employer], dates discussed:    Additional Comments:[facility] to establish at pcp. appt for 04-20-16. appt on dc instruct sheet. Pt has match letter to use for meds til gets in clinic and can then [facility]. [facility] for [facility]s w match letter. Explained match letter again.  Lacretia Leigh, RN 04/14/2016, 9:37 AM

## 2016-04-14 NOTE — Progress Notes (Signed)
[facility] for heparin bridge to warfarin Indication: atrial fibrillation  Allergies  Allergen Reactions  . No Known Allergies     Patient Measurements: Height: 5\' 10"  (177.8 cm) Weight: 222 lb 6.4 oz (100.9 kg) IBW/kg (Calculated) : 73 Heparin Dosing Weight: 96kg  Vital Signs: Temp: 98.1 F (36.7 C) (02/02 0747) Temp Source: Oral (02/02 0747) BP: 143/111 (02/02 0747) Pulse Rate: 68 (02/02 0747)  Labs:  Recent Labs  04/12/16 0410 04/13/16 0340 04/13/16 1229 04/14/16 0500 04/14/16 0810  HGB 13.3 13.2  --   --  14.6  HCT 39.9 39.5  --   --  44.1  PLT 276 273  --   --  270  LABPROT 19.4* 21.6*  --   --  24.6*  INR 1.61 1.86  --   --  2.17  HEPARINUNFRC 0.66 0.76* 0.66 0.65  --   CREATININE 3.81* 3.47*  --   --  3.51*    Estimated Creatinine Clearance: 31.7 mL/min (by C-G formula based on SCr of 3.51 mg/dL (H)).   Medications:  Heparin @ 1500 units/hr  Assessment/Plan:  45yom transitioned from apixaban to heparin 1/21 for afib given acute renal failure and need for possible CRRT. Last apixaban dose 1/21 @ 0940. Used both APTT and heparin level with DOAC but they now appear to correlate. Transition to heparin levels only, therapeutic today. Successful DCCV 1/26.   Given poor renal function, transitioning to coumadin as opposed to going back on apixaban.   INR now at goal 2.1 this am.  Heparin also at goal on 1400 units/hr will stop as he is being discharged home. No bleeding issues noted. CBC stable.  Average coumadin dose has been around 10mg  daily. Will send home on 10mg  tablets with INR follow up in clinic early next week (04/19/16)  Goal of Therapy:  INR goal 2-3 Heparin level 0.3-0.7 Monitor platelets by anticoagulation protocol: Yes   Erin Hearing PharmD., BCPS Clinical Pharmacist Pager [phone] 04/14/2016 10:20 AM

## 2016-04-14 NOTE — Progress Notes (Signed)
Patient ID: Travis Bryan, male   DOB: 1970-10-10, 46 y.o.   MRN: [id number]   SUBJECTIVE:   Admitted with acute/chronic systolic CHF and atrial flutter. Underwent TEE and AFL ablation 1/19.  Post-procedure developed AKI and cardiogenic shock, started on dobutamine and norepinephrine.  He subsequently went into atrial flutter with controlled rate on amiodarone.   Dobutamine stopped 1/26 and underwent DCCV of atrial fib.  IV lasix stopped 1/27  Went back into AF 1/27. S/p DCCV 04/11/16 with return to NSR. Now on amio and ranolazine.    Feeling OK this morning.  Denies SOB or lightheadedness.   Coox 66%. Creatinine 3.3 -> 3.8 -> 3.4 -> pending. CVP 6.   INR pending.   EF 15-20% with moderate to severe RV HK  Cardiolite: EF 7%, inferolateral fixed defect no ischemia.   Scheduled Meds: . amiodarone  400 mg Oral BID  . carvedilol  3.125 mg Oral BID WC  . insulin aspart  0-20 Units Subcutaneous TID WC  . insulin aspart  6 Units Subcutaneous TID WC  . insulin glargine  25 Units Subcutaneous QHS  . isosorbide-hydrALAZINE  2 tablet Oral TID  . magnesium oxide  400 mg Oral Daily  . mouth rinse  15 mL Mouth Rinse BID  . potassium chloride  20 mEq Oral Daily  . ranolazine  500 mg Oral BID  . sodium chloride flush  3 mL Intravenous Q12H  . torsemide  60 mg Oral Daily  . Warfarin - Pharmacist Dosing Inpatient   Does not apply q1800   Continuous Infusions: . sodium chloride 500 mL (04/11/16 0748)  . heparin 1,400 Units/hr (04/13/16 1900)   PRN Meds:.acetaminophen, ondansetron (ZOFRAN) IV, oxyCODONE-acetaminophen, sodium chloride flush, sodium chloride flush    Vitals:   04/14/16 0000 04/14/16 0034 04/14/16 0300 04/14/16 0747  BP: 136/85 136/85 (!) 132/92 (!) 143/111  Pulse: 65 66 69 68  Resp: 20 18 19  (!) 25  Temp:  98 F (36.7 C) 97.4 F (36.3 C) 98.1 F (36.7 C)  TempSrc:  Oral Oral Oral  SpO2: 90% 95% 94% 91%  Weight:   222 lb 6.4 oz (100.9 kg)   Height:         Intake/Output Summary (Last 24 hours) at 04/14/16 0816 Last data filed at 04/14/16 0200  Gross per 24 hour  Intake              732 ml  Output             1150 ml  Net             -418 ml    LABS: Basic Metabolic Panel:  Recent Labs  04/12/16 0410 04/12/16 0930 04/13/16 0340  NA 133*  --  134*  K 4.1  --  3.8  CL 94*  --  94*  CO2 27  --  28  GLUCOSE 117*  --  92  BUN 64*  --  61*  CREATININE 3.81*  --  3.47*  CALCIUM 9.6  --  9.7  MG  --  2.3  --    Liver Function Tests:  Recent Labs  04/13/16 0340  AST 22  ALT 19  ALKPHOS 112  BILITOT 1.2  PROT 7.9  ALBUMIN 3.2*   No results for input(s): LIPASE, AMYLASE in the last 72 hours. CBC:  Recent Labs  04/12/16 0410 04/13/16 0340  WBC 4.3 3.8*  NEUTROABS  --  1.8  HGB 13.3 13.2  HCT 39.9 39.5  MCV 89.1 89.2  PLT 276 273   Cardiac Enzymes: No results for input(s): CKTOTAL, CKMB, CKMBINDEX, TROPONINI in the last 72 hours. BNP: Invalid input(s): POCBNP D-Dimer: No results for input(s): DDIMER in the last 72 hours. Hemoglobin A1C: No results for input(s): HGBA1C in the last 72 hours. Fasting Lipid Panel: No results for input(s): CHOL, HDL, LDLCALC, TRIG, CHOLHDL, LDLDIRECT in the last 72 hours. Thyroid Function Tests: No results for input(s): TSH, T4TOTAL, T3FREE, THYROIDAB in the last 72 hours.  Invalid input(s): FREET3 Anemia Panel: No results for input(s): VITAMINB12, FOLATE, FERRITIN, TIBC, IRON, RETICCTPCT in the last 72 hours.  RADIOLOGY: Dg Chest 2 View  Result Date: 03/28/2016 CLINICAL DATA:  Shortness of breath EXAM: CHEST  2 VIEW COMPARISON:  04/25/2007 FINDINGS: Left-sided single lead pacing device with lead over right ventricle. Moderate cardiomegaly without overt failure. No acute infiltrate, consolidation, or pleural effusion. No pneumothorax peer IMPRESSION: Cardiomegaly without overt failure.  No acute infiltrate Electronically Signed   By: Donavan Foil M.D.   On: 03/28/2016 01:55    Nm Myocar Multi W/spect W/wall Motion / Ef  Result Date: 03/29/2016 CLINICAL DATA:  History of cardiomyopathy and atrial flutter. Acute on chronic systolic CHF. EXAM: MYOCARDIAL IMAGING WITH SPECT (REST AND PHARMACOLOGIC-STRESS) GATED LEFT VENTRICULAR WALL MOTION STUDY LEFT VENTRICULAR EJECTION FRACTION TECHNIQUE: Standard myocardial SPECT imaging was performed after resting intravenous injection of 10 mCi Tc-60m tetrofosmin. Subsequently, intravenous infusion of Lexiscan was performed under the supervision of the Cardiology staff. At peak effect of the drug, 30 mCi Tc-69m tetrofosmin was injected intravenously and standard myocardial SPECT imaging was performed. Quantitative gated imaging was also performed to evaluate left ventricular wall motion, and estimate left ventricular ejection fraction. COMPARISON:  None. FINDINGS: Perfusion: There is a large fixed defect involving the inferolateral wall compatible with an infarct. There is no evidence to suggest reversibility or ischemia. Left ventricle is dilated. Wall Motion: There is markedly abnormal wall motion throughout the left ventricle. Severe hypokinesia throughout the left ventricle and difficult to exclude areas of paradoxical motion due to the minimal wall motion. Left Ventricular Ejection Fraction: 7 % End diastolic volume 295 ml End systolic volume 188 ml IMPRESSION: 1. Large infarct involving the inferolateral wall. No evidence for ischemia or reversibility. 2. Markedly abnormal wall motion throughout the left ventricle. Severe hypokinesia throughout the left ventricle. 3. Left ventricular ejection fraction is 7%. 4. Non invasive risk stratification*: High *2012 Appropriate Use Criteria for Coronary Revascularization Focused Update: J Am Coll Cardiol. 4166;06(3):016-010. http://content.airportbarriers.com.aspx?articleid=[email or web address] Electronically Signed   By: Markus Daft M.D.   On: 03/29/2016 13:52   Dg Chest Port 1 View  Result Date:  04/01/2016 CLINICAL DATA:  Acute on chronic systolic congestive heart failure. Nonischemic cardiomyopathy. EXAM: PORTABLE CHEST 1 VIEW COMPARISON:  03/28/2016 FINDINGS: Stable mild cardiomegaly. AICD remains in stable position with tip overlying the left ventricle. No pneumothorax visualized. Mild opacity in the left costophrenic angle may be due to atelectasis or small pleural effusion. Lung fields are otherwise clear. IMPRESSION: New opacity in left costophrenic angle, which may be due to atelectasis or pleural effusion. Electronically Signed   By: Earle Gell M.D.   On: 04/01/2016 20:57   PHYSICAL EXAM CVP 7 General: Seated in chair.   HEENT: Poor dentition.  Neck: JVP 7 cm. No thyromegaly or thyroid nodule.  Lungs: Mildly diminished basilar sounds.  CV: Lateral PMI.  Heart irregular S1/S2, no S3, no murmur.   Abdomen: Soft, NT, ND, no HSM. No bruits or  masses. +BS  Neurologic: Alert and oriented x 3.  Psych: Normal affect. Extremities: No clubbing or cyanosis. No peripheral edema.   TELEMETRY:  Reviewed, Remains in NSR 70s   ASSESSMENT AND PLAN: 46 yo with long history of nonischemic cardiomyopathy, HTN, DM, and CKD stage III presented with exertional dyspnea/CHF exacerbation and was noted to have had ICD discharge on 1/14 for polymorphic VT.   1. Atrial flutter with mild RVR => atrial fibrillation: Newly noted, was in atrial flutter at admission.  Now s/p AFL ablation 1/19.  Next went into atrial fibrillation, s/p DCVV 1/26.  Back in atrial fibrillation 1/27.  Ranolazine started.  s/p DCCV to NSR 1/30. - Continue ranolazine + amiodarone, QTc 503 msec 04/13/16 (OK with amiodarone use and IVCD).   - If goes back into Afib, will favor rate control.  - Pending INR this morning, loading coumadin with heparin bridge.    2. Acute on chronic systolic CHF -> cardiogenic shock: EF 04/02/2016  EF 20% on echo with moderate to severely decreased RV systolic function.  Possibly hypertensive  cardiomyopathy.  Had cath around time of diagnosis in 2006 without significant coronary disease.  St Jude ICD.  Cardiolite 1/17 showed EF 7% with inferolateral fixed defect, no ischemia (possible artifact).  TEE with severe biventricular dysfunction.  He developed cardiogenic shock with AKI after flutter ablation. He has been diuresed and is now stable.  Co-ox 66% today.  - Stable volume, continue torsemide 60 mg daily.  - Continue Bidil to 2 tab tid. BP stable.  - Continue Coreg 3.125 mg bid. Arlyce Harman contraindicated with Creatinine > 2.5 at baseline.  3. Acute on CKD Stage III: Pending am creatinine.  4. DM: Poor control => hgbA1c 13.6.  He does not know who manages this at home (?no PCP).  Has just been taking metformin.  On insulin in hospital, adjusting with help of diabetes nurse coordinator.  - Care management following and has PCP.   No change to current plan.  5. VT: Polymorphic VT on 1/14 with ICD discharge, VT on 01/11/16 with ICD discharge.  Suspect related to CHF exacerbation rather than ischemia.  - Quiescent on amio - Keep K> 4 and mag > 2.  6. Disposition: I would like to send him home today.  Still pending INR, can give Lovenox if still low but suspect close.  He will need followup with me in 10-14 days.  He needs a PCP urgently to follow his diabetes.  He needs coumadin clinic followup.  Cardiac meds for home: Coumadin, Coreg 3.125 mg bid, Bidil 2 tabs tid, torsemide 60 daily, amiodarone 400 mg bid x 5 more days then 200 mg bid x 1 week then 200 mg daily after that, KCl 20 daily, ranolazine 500 mg bid.  He will need to go home on current insulin regimen, will likely need teaching as well as a home machine to check blood glucose.  He needs followup scheduled with PCP.   Loralie Champagne 04/14/2016 8:16 AM

## 2016-04-14 NOTE — Discharge Summary (Signed)
Advanced Heart Failure Discharge Note  Discharge Summary   Patient ID: Travis Bryan MRN: [id number], DOB/AGE: 05/09/1970 46 y.o. Admit date: 03/28/2016 D/C date:     04/14/2016   Primary Discharge Diagnoses:  1. Aflutter with mild RVR => Afib 2. Acute on chronic systolic CHF - LVEF 40% on echo 04/02/16 s/p St Jude ICD 3. Acute on CKD III 4. Uncontrolled DM2 -> Hgb A1c 13.6 5. VT 6. Hypokalemia  Hospital Course:   Travis Bryan is a 46 y.o. male with history of Chronic systolic CHF s/p , A flutter, hx of polymorphic VT, poorly controlled Diabetes type 2, and CKD stage III  Admitted 03/28/16 after presenting to ED with multiple complaints.  By ICD interrogation had VT with shock on 03/26/16 and did not remember.  Found to be markedly hypokalemic at 2.7 which was repleted  Noted to be in Aflutter on tele and EKG on admission. Started on Eliquis but transitioned to coumadin prior to d/c. Discussed with EP.  TEE performed prior to undergoing AFL ablation 03/31/16. Pt then went into afib.  S/p DCCV 1/26 but did not hold NSR. Ranolazone started and DCCV repeat 04/11/16. Pt held NSR this time and remained in NSR up to discharge.  Hospital course complicated by AKI and cardiogenic shock s/p AFL ablation requiring dobutamine and norepineprhine.  These were weaned as tolerated. Echo 04/02/16 with LVEF 20% with mod/sev RV dysfunction. HF medications titrated as tolerated once off pressor support.   Pt also noted to have uncontrolled DM2 with markedly elevated Hgb A1c at 13.6 on admission.  Started on insulin and to be continued at discharge.  Follow up with PCP and CHW arranged.   Overall pt diuresed 14.6L admission. Weight down 9 lbs from admission weight. He will be discharged to home in stable but tenuous condition with follow up as below.  Pt is at high risk for re-admission with poor insight into his disease and questionable compliance.  Every effort was made to make adequate follow up for this  patient and provide resources for follow up and medication.  Match letter sent to pay for his medications, which will be sent to [facility].   Discharge Weight Range: 222 lbs Discharge Vitals: Blood pressure (!) 143/111, pulse 68, temperature 98.1 F (36.7 C), temperature source Oral, resp. rate (!) 25, height _0  (1.778 m), weight 222 lb 6.4 oz (100.9 kg), SpO2 91 %.  Labs: Lab Results  Component Value Date   WBC 4.4 04/14/2016   HGB 14.6 04/14/2016   HCT 44.1 04/14/2016   MCV 89.6 04/14/2016   PLT 270 04/14/2016     Recent Labs Lab 04/13/16 0340 04/14/16 0810  NA 134* 134*  K 3.8 4.6  CL 94* 97*  CO2 28 23  BUN 61* 55*  CREATININE 3.47* 3.51*  CALCIUM 9.7 10.1  PROT 7.9  --   BILITOT 1.2  --   ALKPHOS 112  --   ALT 19  --   AST 22  --   GLUCOSE 92 118*   Lab Results  Component Value Date   CHOL 196 04/13/2009   HDL 39 (L) 04/13/2009   LDLCALC 136 (H) 04/13/2009   TRIG 107 04/13/2009   BNP (last 3 results)  Recent Labs  03/28/16 0407  BNP 1,431.5*    ProBNP (last 3 results) No results for input(s): PROBNP in the last 8760 hours.   Diagnostic Studies/Procedures   No results found.  Discharge Medications   Allergies as  of 04/14/2016      Reactions   No Known Allergies       Medication List    STOP taking these medications   furosemide 80 MG tablet Commonly known as:  LASIX   hydrALAZINE 50 MG tablet Commonly known as:  APRESOLINE   HYDROcodone-acetaminophen 5-325 MG tablet Commonly known as:  NORCO   lisinopril 40 MG tablet Commonly known as:  PRINIVIL,ZESTRIL   metFORMIN 1000 MG tablet Commonly known as:  GLUCOPHAGE   potassium chloride 10 MEQ tablet Commonly known as:  K-DUR   spironolactone 25 MG tablet Commonly known as:  ALDACTONE     TAKE these medications   amiodarone 200 MG tablet Commonly known as:  PACERONE Take 400 mg (2 tabs) BID until 04/19/16. Then take 200 mg (1 tab) BID until 04/26/16.  Then  take 200 mg daily.   carvedilol 3.125 MG tablet Commonly known as:  COREG Take 1 tablet (3.125 mg total) by mouth 2 (two) times daily with a meal. What changed:  See the new instructions.   insulin aspart 100 UNIT/ML injection Commonly known as:  novoLOG Inject 6 Units into the skin 3 (three) times daily with meals.   insulin aspart 100 UNIT/ML injection Commonly known as:  novoLOG Inject 0-20 Units into the skin 3 (three) times daily with meals.   insulin glargine 100 UNIT/ML injection Commonly known as:  LANTUS Inject 0.25 mLs (25 Units total) into the skin at bedtime.   insulin starter kit- syringes Misc 1 kit by Other route once.   isosorbide-hydrALAZINE 20-37.5 MG tablet Commonly known as:  BIDIL Take 2 tablets by mouth 3 (three) times daily.   magnesium oxide 400 (241.3 Mg) MG tablet Commonly known as:  MAG-OX Take 1 tablet (400 mg total) by mouth daily. Start taking on:  04/15/2016   potassium chloride SA 20 MEQ tablet Commonly known as:  K-DUR,KLOR-CON Take 1 tablet (20 mEq total) by mouth daily. Start taking on:  04/15/2016   ranolazine 500 MG 12 hr tablet Commonly known as:  RANEXA Take 1 tablet (500 mg total) by mouth 2 (two) times daily.   torsemide 20 MG tablet Commonly known as:  DEMADEX Take 3 tablets (60 mg total) by mouth daily. Start taking on:  04/15/2016   warfarin 10 MG tablet Commonly known as:  COUMADIN Take 1 tablet (10 mg total) by mouth daily at 6 PM. Or as directed by Coumadin clinic       Disposition   The patient will be discharged in stable condition to home. Discharge Instructions    (HEART FAILURE PATIENTS) Call MD:  Anytime you have any of the following symptoms: 1) 3 pound weight gain in 24 hours or 5 pounds in 1 week 2) shortness of breath, with or without a dry hacking cough 3) swelling in the hands, feet or stomach 4) if you have to sleep on extra pillows at night in order to breathe.    Complete by:  As directed    Amb Referral  to Cardiac Rehabilitation    Complete by:  As directed    Diagnosis:  Heart Failure (see criteria below if ordering Phase II)   Heart Failure Type:  Chronic Systolic   Diet - low sodium heart healthy    Complete by:  As directed    Heart Failure patients record your daily weight using the same scale at the same time of day    Complete by:  As directed    Increase  activity slowly    Complete by:  As directed      [place] Follow up on 04/20/2016.   Why:  appt at 10:15am please be there at 10am Contact information: [address] [place] Follow up on 04/19/2016.   Specialty:  Cardiology Why:  at 0915 for coumadin follow up.  Contact information: [address] [place] Luis Llorens Torres, NP Follow up on 05/05/2016.   Specialty:  Cardiology Why:  at 0940 for follow up in Dr. Forde Dandy office. Please bring all of your medication to your visit.  Contact information: [place] [id number] [phone]        Loralie Champagne, MD Follow up on 04/26/2016.   Specialty:  Cardiology Why:  at 1030 for post hospital follow up. Please bring all of your medications to your visit. The code for parking is 5001. Leisure centre manager through Architect site [employer]. Underground parking on your right. Or park in bottom ER lot.  Contact information: [place] [phone]             Duration of Discharge Encounter: Greater than 35 minutes   Signed, Shirley Friar, PA-C 04/14/2016, 10:59 AM

## 2016-04-19 ENCOUNTER — Telehealth (HOSPITAL_COMMUNITY): Payer: Self-pay | Admitting: Cardiology

## 2016-04-19 ENCOUNTER — Telehealth: Payer: Self-pay | Admitting: General Practice

## 2016-04-19 NOTE — Telephone Encounter (Signed)
APT. REMINDER CALL, LMTCB °

## 2016-04-19 NOTE — Telephone Encounter (Signed)
Katie called after patients home visit to report bp 158/118 Took all meds, no sxs, will see pcp 2/8   Per [place] to increase hydralazine to 100 mg TID  Detailed message left for [facility] [place] paramedicine  Patient aware and voiced understanding

## 2016-04-20 ENCOUNTER — Ambulatory Visit (INDEPENDENT_AMBULATORY_CARE_PROVIDER_SITE_OTHER): Payer: Self-pay | Admitting: *Deleted

## 2016-04-20 ENCOUNTER — Encounter: Payer: Self-pay | Admitting: Internal Medicine

## 2016-04-20 ENCOUNTER — Ambulatory Visit (INDEPENDENT_AMBULATORY_CARE_PROVIDER_SITE_OTHER): Payer: Self-pay | Admitting: Internal Medicine

## 2016-04-20 DIAGNOSIS — Z8249 Family history of ischemic heart disease and other diseases of the circulatory system: Secondary | ICD-10-CM

## 2016-04-20 DIAGNOSIS — I481 Persistent atrial fibrillation: Secondary | ICD-10-CM

## 2016-04-20 DIAGNOSIS — Z794 Long term (current) use of insulin: Secondary | ICD-10-CM

## 2016-04-20 DIAGNOSIS — N184 Chronic kidney disease, stage 4 (severe): Secondary | ICD-10-CM

## 2016-04-20 DIAGNOSIS — Z7901 Long term (current) use of anticoagulants: Secondary | ICD-10-CM

## 2016-04-20 DIAGNOSIS — Z833 Family history of diabetes mellitus: Secondary | ICD-10-CM

## 2016-04-20 DIAGNOSIS — I4819 Other persistent atrial fibrillation: Secondary | ICD-10-CM

## 2016-04-20 DIAGNOSIS — Z5181 Encounter for therapeutic drug level monitoring: Secondary | ICD-10-CM

## 2016-04-20 DIAGNOSIS — E1165 Type 2 diabetes mellitus with hyperglycemia: Secondary | ICD-10-CM

## 2016-04-20 DIAGNOSIS — I5022 Chronic systolic (congestive) heart failure: Secondary | ICD-10-CM

## 2016-04-20 DIAGNOSIS — I483 Typical atrial flutter: Secondary | ICD-10-CM

## 2016-04-20 DIAGNOSIS — I10 Essential (primary) hypertension: Secondary | ICD-10-CM

## 2016-04-20 DIAGNOSIS — E1122 Type 2 diabetes mellitus with diabetic chronic kidney disease: Secondary | ICD-10-CM

## 2016-04-20 DIAGNOSIS — Z95810 Presence of automatic (implantable) cardiac defibrillator: Secondary | ICD-10-CM

## 2016-04-20 DIAGNOSIS — I130 Hypertensive heart and chronic kidney disease with heart failure and stage 1 through stage 4 chronic kidney disease, or unspecified chronic kidney disease: Secondary | ICD-10-CM

## 2016-04-20 DIAGNOSIS — Z87891 Personal history of nicotine dependence: Secondary | ICD-10-CM

## 2016-04-20 LAB — POCT INR: INR: 4.9

## 2016-04-20 LAB — GLUCOSE, CAPILLARY: Glucose-Capillary: 171 mg/dL — ABNORMAL HIGH (ref 65–99)

## 2016-04-20 NOTE — Assessment & Plan Note (Addendum)
Patient was recently admitted to the hospital with a flutter. Patient was noted to be in a flutter on telemetry and EKG on admission he remains on Coumadin since discharge. TEE was performed prior to undergoing a bowel ablation on January 19 of 2018, but patient later went into atrial fibrillation. He is status post DCCV on January 26 but unfortunately did not maintain normal sinus rhythm. He again underwent DCCV on January 30. He maintained normal sinus rhythm until discharge. INR check today was 4.9. His Coumadin was adjusted accordingly. We went over his Coumadin doses thoroughly and patient understands his regimen. Patient is to follow up with Coumadin clinic in one week. Patient denies any heart fluttering racing or palpitations. Thankfully patient remains in sinus rhythm on this regimen. His heart rate today is 88.  Plan: -Continue amiodarone 200 mg twice a day until February 14 and then reduce dose to 200 mg once a day. Patient voices understanding. -Continue Coumadin -Patient is to hold Coumadin today and tomorrow, then return to his schedule on February 10 due to supratherapeutic INR at 4.9 today patient voices understanding

## 2016-04-20 NOTE — Progress Notes (Signed)
CC: Hospital follow-up for chronic systolic heart failure and type 2 diabetes  HPI: Travis Bryan is a 46 y.o. male with PMHx of uncontrolled type 2 diabetes, obesity, persistent atrial fibrillation on Coumadin, hypertension, and chronic systolic heart failure status post AICD placement who presents to the clinic for follow-up for chronic systolic heart failure and type 2 diabetes.   Patient was recently admitted to the hospital with a flutter. Patient was noted to be in a flutter on telemetry and EKG on admission he remains on Coumadin since discharge. TEE was performed prior to undergoing a bowel ablation on January 19 of 2018, but patient later went into atrial fibrillation. He is status post DCCV on January 26 but unfortunately did not maintain normal sinus rhythm. He again underwent DCCV on January 30. He maintained normal sinus rhythm until discharge. INR check today was 4.9. His Coumadin was adjusted accordingly. We went over his Coumadin doses thoroughly and patient understands his regimen. Patient is to follow up with Coumadin clinic in one week. Patient denies any heart fluttering racing or palpitations.  Patient has a history of chronic kidney disease stage III. However, during hospitalization he developed acute on chronic kidney disease. His creatinine on discharge was 3.51. We will recheck a basic metabolic panel today. He remains on potassium and magnesium supplementation.  Patient has chronic systolic heart failure for which she previously followed with cardiology. During hospitalization, patient developed cardiogenic shock requiring dobutamine and norepinephrine. Echocardiogram on January 21 revealed EF of 20% and moderate to severe right ventricular dysfunction. Dobutamine and norepinephrine were titrated off and his new heart failure medications were titrated up as tolerated. He was diuresed 14.6 L during hospitalization and weight decreased 9 pounds from admission weight. His  weight on discharge was 222 pounds, weight today is 223.6. Patient denies chest pain and shortness of breath weight gain and dyspnea on exertion or orthopnea. He reports good understanding of his medication regimen and reports compliance. He denies any issues with obtaining his medications. He does not need any refills at this time. His aunt has been helping him with his medications and he has a pillbox at home as well. He will follow-up with cardiology on February 14 in February 23.  Patient also has uncontrolled diabetes. His A1c on 03/30/2016 was 13.4 he was discharged on NovoLog 6 units 3 times a day with meals, NovoLog sliding scale insulin, and Lantus 20 units at night. He did not bring his meter with him today. Patient reports compliance with his medications, but he has been taking his NovoLog after he eats. We discussed taking this medication prior to eating. He did not bring his meter with him, but he reports his fasting glucose in the morning is in the 80s to 90s. Denies any lightheadedness diaphoresis or weakness at this range. Throughout the rest of the day his glucose ranges from the 120s to 150s. Today his CBG check was 170 after drinking orange juice.  Past Medical History:  Diagnosis Date  . AICD (automatic cardioverter/defibrillator) present   . Atrial flutter ([place])    a. s/p ablation 03/2016  . Chronic systolic CHF (congestive heart failure) ([place])   . History of gout   . Hyperlipidemia   . Hypertension   . Myocardial infarction    "I think I had one a long long time ago" (03/28/2016)  . NICM (nonischemic cardiomyopathy) ([place])    a. LHC 6/06: pLAD 20, pLCx 20-30; b. Echo 5/15:  EF 15%, diffuse HK,  restrictive physiology, trivial AI, trivial MR, mild LAE, moderate RVE, moderately reduced RVSF, moderate RAE, mild to moderate TR, PASP 43 mmHg  . Obesity   . Persistent atrial fibrillation ([place])   . Type II diabetes mellitus ([facility])    Family History  Problem Relation Age of Onset  .  Hypertension Mother   . Heart disease Mother   . Diabetes Mother    Social History   Social History  . Marital status: Widowed    Spouse name: N/A  . Number of children: N/A  . Years of education: N/A   Occupational History  . Not on file.   Social History Main Topics  . Smoking status: Former Smoker    Years: 10.00    Types: Cigars    Quit date: 07/28/2002  . Smokeless tobacco: Never Used  . Alcohol use Yes     Comment: 03/28/2016 "stopped drinking 4-5 months ago"  . Drug use: No  . Sexual activity: Not Currently   Other Topics Concern  . Not on file   Social History Narrative  . No narrative on file   Past Surgical History:  Procedure Laterality Date  . CARDIOVERSION N/A 04/07/2016   Procedure: CARDIOVERSION;  Surgeon: Thayer Headings, MD;  Location: [facility] ENDOSCOPY;  Service: Cardiovascular;  Laterality: N/A;  . CARDIOVERSION N/A 04/11/2016   Procedure: CARDIOVERSION;  Surgeon: Larey Dresser, MD;  Location: [place];  Service: Cardiovascular;  Laterality: N/A;  . ELECTROPHYSIOLOGIC STUDY N/A 03/31/2016   Procedure: A-Flutter Ablation;  Surgeon: Evans Lance, MD;  Location: [facility] LAB;  Service: Cardiovascular;  Laterality: N/A;  . IMPLANTABLE CARDIOVERTER DEFIBRILLATOR (ICD) GENERATOR CHANGE N/A 08/27/2013   Procedure: ICD GENERATOR CHANGE;  Surgeon: Evans Lance, MD;  Location: [facility] CATH LAB;  Service: Cardiovascular;  Laterality: N/A;  . TEE WITHOUT CARDIOVERSION N/A 03/31/2016   Procedure: TRANSESOPHAGEAL ECHOCARDIOGRAM (TEE);  Surgeon: Larey Dresser, MD;  Location: [facility] ENDOSCOPY;  Service: Cardiovascular;  Laterality: N/A;     Review of Systems: Please see pertinent ROS reviewed in HPI and problem based charting.    Physical Exam: Vitals:   04/20/16 1331 04/20/16 1407  BP: (!) 150/97 134/84  Pulse: 80   Temp: 98.7 F (37.1 C)   TempSrc: Oral   SpO2: 98%   Weight: 223 lb 9.6 oz (101.4 kg)   Height: 5\' 10"  (1.778 m)    General: Vital signs  reviewed.  Patient is chronically ill appearing, in no acute distress and cooperative with exam.  Eyes: No scleral icterus or conjunctival injection. Mouth: Normal oral mucosa. Neck: Supple, trachea midline, no JVD or carotid bruit present.  Cardiovascular: RRR, S1 normal, S2 normal Pulmonary/Chest: Clear to auscultation bilaterally, no wheezes, rales, or rhonchi. Abdominal: Soft, non-tender, non-distended, BS + Extremities: Trace lower extremity edema bilaterally, non-tender Skin: No rashes or erythema.  Psychiatric: Normal mood and affect. speech and behavior is normal. Cognition and memory are normal.   Assessment & Plan:  See encounters tab for problem based medical decision making. Patient discussed with Dr. Daryll Drown

## 2016-04-20 NOTE — Patient Instructions (Addendum)
Travis Bryan, Travis Bryan are doing a great job with taking your medications! Please continue taking all medications as prescribed.  For your diabetes, continue your current regimen, but remember to take the Novolog 6 units before breakfast, lunch, and dinner. If you forget to take it beforehand, you can take it after you eats. Continue to check you blood sugar. He would like you to follow up with [place] in 2 months to recheck your diabetes control.  Please follow-up with your cardiologist for your heart failure. If you develop shortness of breath or weight gain, please call the clinic and we would be happy to see you. Your current weight is 223 pounds and you should aim to stay in this range. Gaining weight more than 5 pounds above this, would be concern for fluid.  If you have any questions going forward or have any difficulty with your medications please give our clinic a call!

## 2016-04-20 NOTE — Assessment & Plan Note (Addendum)
Patient also has uncontrolled diabetes. His A1c on 03/30/2016 was 13.4 he was discharged on NovoLog 6 units 3 times a day with meals, NovoLog sliding scale insulin, and Lantus 20 units at night. He did not bring his meter with him today. Patient reports compliance with his medications, but he has been taking his NovoLog after he eats. We discussed taking this medication prior to eating. He did not bring his meter with him, but he reports his fasting glucose in the morning is in the 80s to 90s. Denies any lightheadedness diaphoresis or weakness at this range. Throughout the rest of the day his glucose ranges from the 120s to 150s. Today his CBG check was 170 after drinking orange juice.  Plan: -Continue current regimen of Lantus 20 units at night and NovoLog 6 units 3 times a day with meals -Return in 2 months for repeat A1c

## 2016-04-20 NOTE — Assessment & Plan Note (Addendum)
BP Readings from Last 3 Encounters:  04/20/16 134/84  04/14/16 (!) 146/108  01/19/16 128/82    Lab Results  Component Value Date   NA 134 (L) 04/14/2016   K 4.6 04/14/2016   CREATININE 3.51 (H) 04/14/2016    Assessment: Blood pressure control:   controlled Progress toward BP goal:   at goal Comments: Compliant with torsemide 60 mg daily, Imdur 90 mg daily, hydralazine 75 mg 3 times a day, Coreg 3.125 mg twice a day, and amiodarone 200 mg twice a day.  Plan: Medications:  continue current medications

## 2016-04-20 NOTE — Patient Instructions (Signed)

## 2016-04-20 NOTE — Assessment & Plan Note (Addendum)
Patient has chronic systolic heart failure for which she previously followed with cardiology. During hospitalization, patient developed cardiogenic shock requiring dobutamine and norepinephrine. Echocardiogram on January 21 revealed EF of 20% and moderate to severe right ventricular dysfunction. Dobutamine and norepinephrine were titrated off and his new heart failure medications were titrated up as tolerated. He was diuresed 14.6 L during hospitalization and weight decreased 9 pounds from admission weight. His weight on discharge was 222 pounds, weight today is 223.6. Patient denies chest pain and shortness of breath weight gain and dyspnea on exertion or orthopnea. He reports good understanding of his medication regimen and reports compliance. He denies any issues with obtaining his medications. He does not need any refills at this time. His aunt has been helping him with his medications and he has a pillbox at home as well. He will follow-up with cardiology on February 14 in February 23. Patient appears euvolemic on exam.  Plan: -Continue current regimen of torsemide 60 mg once a day, Ranexa 500 mg twice a day, Imdur 90 mg once a day, hydralazine 75 mg 3 times a day, Coreg 3.125 mg twice a day, and amiodarone 200 mg once a day. -Continue weight checks, patient instructed to notify our clinic or cardiology if he gains 3-5 pounds in a day or 2.

## 2016-04-20 NOTE — Assessment & Plan Note (Addendum)
Patient has a history of chronic kidney disease stage III. However, during hospitalization he developed acute on chronic kidney disease. His creatinine on discharge was 3.51. We will recheck a basic metabolic panel today. He remains on potassium and magnesium supplementation.

## 2016-04-21 ENCOUNTER — Encounter: Payer: Self-pay | Admitting: Internal Medicine

## 2016-04-21 LAB — BMP8+ANION GAP
Anion Gap: 19 mmol/L — ABNORMAL HIGH (ref 10.0–18.0)
BUN / CREAT RATIO: 13 (ref 9–20)
BUN: 43 mg/dL — AB (ref 6–24)
CHLORIDE: 99 mmol/L (ref 96–106)
CO2: 23 mmol/L (ref 18–29)
Calcium: 9.6 mg/dL (ref 8.7–10.2)
Creatinine, Ser: 3.43 mg/dL — ABNORMAL HIGH (ref 0.76–1.27)
GFR calc Af Amer: 24 mL/min/{1.73_m2} — ABNORMAL LOW (ref >59)
GFR calc non Af Amer: 20 mL/min/{1.73_m2} — ABNORMAL LOW (ref >59)
GLUCOSE: 135 mg/dL — AB (ref 65–99)
POTASSIUM: 4.9 mmol/L (ref 3.5–5.2)
SODIUM: 141 mmol/L (ref 134–144)

## 2016-04-26 ENCOUNTER — Ambulatory Visit (INDEPENDENT_AMBULATORY_CARE_PROVIDER_SITE_OTHER): Payer: BLUE CROSS/BLUE SHIELD | Admitting: *Deleted

## 2016-04-26 ENCOUNTER — Encounter (HOSPITAL_COMMUNITY): Payer: Self-pay

## 2016-04-26 ENCOUNTER — Ambulatory Visit (HOSPITAL_COMMUNITY)
Admit: 2016-04-26 | Discharge: 2016-04-26 | Disposition: A | Discharge status: Home / Self Care | Payer: BLUE CROSS/BLUE SHIELD | Attending: Cardiology | Admitting: Cardiology

## 2016-04-26 ENCOUNTER — Other Ambulatory Visit (HOSPITAL_COMMUNITY): Payer: Self-pay

## 2016-04-26 VITALS — BP 160/90 | HR 85 | Wt 228.0 lb

## 2016-04-26 DIAGNOSIS — E119 Type 2 diabetes mellitus without complications: Secondary | ICD-10-CM | POA: Insufficient documentation

## 2016-04-26 DIAGNOSIS — I5022 Chronic systolic (congestive) heart failure: Secondary | ICD-10-CM | POA: Diagnosis not present

## 2016-04-26 DIAGNOSIS — Z794 Long term (current) use of insulin: Secondary | ICD-10-CM | POA: Insufficient documentation

## 2016-04-26 DIAGNOSIS — I480 Paroxysmal atrial fibrillation: Secondary | ICD-10-CM | POA: Diagnosis not present

## 2016-04-26 DIAGNOSIS — I481 Persistent atrial fibrillation: Secondary | ICD-10-CM

## 2016-04-26 DIAGNOSIS — Z7901 Long term (current) use of anticoagulants: Secondary | ICD-10-CM | POA: Insufficient documentation

## 2016-04-26 DIAGNOSIS — I428 Other cardiomyopathies: Secondary | ICD-10-CM

## 2016-04-26 DIAGNOSIS — I2510 Atherosclerotic heart disease of native coronary artery without angina pectoris: Secondary | ICD-10-CM | POA: Insufficient documentation

## 2016-04-26 DIAGNOSIS — Z9889 Other specified postprocedural states: Secondary | ICD-10-CM | POA: Insufficient documentation

## 2016-04-26 DIAGNOSIS — I130 Hypertensive heart and chronic kidney disease with heart failure and stage 1 through stage 4 chronic kidney disease, or unspecified chronic kidney disease: Secondary | ICD-10-CM | POA: Insufficient documentation

## 2016-04-26 DIAGNOSIS — I4819 Other persistent atrial fibrillation: Secondary | ICD-10-CM

## 2016-04-26 DIAGNOSIS — I4892 Unspecified atrial flutter: Secondary | ICD-10-CM | POA: Insufficient documentation

## 2016-04-26 DIAGNOSIS — N184 Chronic kidney disease, stage 4 (severe): Secondary | ICD-10-CM

## 2016-04-26 DIAGNOSIS — Z87891 Personal history of nicotine dependence: Secondary | ICD-10-CM | POA: Insufficient documentation

## 2016-04-26 DIAGNOSIS — E1122 Type 2 diabetes mellitus with diabetic chronic kidney disease: Secondary | ICD-10-CM | POA: Insufficient documentation

## 2016-04-26 DIAGNOSIS — I429 Cardiomyopathy, unspecified: Secondary | ICD-10-CM | POA: Insufficient documentation

## 2016-04-26 LAB — COMPREHENSIVE METABOLIC PANEL
ALK PHOS: 124 U/L (ref 38–126)
ALT: 24 U/L (ref 17–63)
AST: 26 U/L (ref 15–41)
Albumin: 3.5 g/dL (ref 3.5–5.0)
Anion gap: 12 (ref 5–15)
BILIRUBIN TOTAL: 1 mg/dL (ref 0.3–1.2)
BUN: 25 mg/dL — ABNORMAL HIGH (ref 6–20)
CALCIUM: 9.5 mg/dL (ref 8.9–10.3)
CO2: 27 mmol/L (ref 22–32)
CREATININE: 2.79 mg/dL — AB (ref 0.61–1.24)
Chloride: 102 mmol/L (ref 101–111)
GFR, EST AFRICAN AMERICAN: 30 mL/min — AB (ref >60)
GFR, EST NON AFRICAN AMERICAN: 26 mL/min — AB (ref >60)
Glucose, Bld: 91 mg/dL (ref 65–99)
Potassium: 4.1 mmol/L (ref 3.5–5.1)
Sodium: 141 mmol/L (ref 135–145)
TOTAL PROTEIN: 7.8 g/dL (ref 6.5–8.1)

## 2016-04-26 LAB — CBC
HEMATOCRIT: 44.3 % (ref 39.0–52.0)
Hemoglobin: 14.4 g/dL (ref 13.0–17.0)
MCH: 29.1 pg (ref 26.0–34.0)
MCHC: 32.5 g/dL (ref 30.0–36.0)
MCV: 89.5 fL (ref 78.0–100.0)
Platelets: 207 10*3/uL (ref 150–400)
RBC: 4.95 MIL/uL (ref 4.22–5.81)
RDW: 14.3 % (ref 11.5–15.5)
WBC: 4.6 10*3/uL (ref 4.0–10.5)

## 2016-04-26 LAB — TSH: TSH: 4.118 u[IU]/mL (ref 0.350–4.500)

## 2016-04-26 LAB — MAGNESIUM: MAGNESIUM: 1.8 mg/dL (ref 1.7–2.4)

## 2016-04-26 MED ORDER — CARVEDILOL 6.25 MG PO TABS: 6.2500 mg | ORAL_TABLET | Freq: Two times a day (BID) | ORAL | 3 refills | Status: DC

## 2016-04-26 MED ORDER — TORSEMIDE 20 MG PO TABS: 60.0000 mg | ORAL_TABLET | Freq: Every day | ORAL | 6 refills | Status: DC

## 2016-04-26 MED ORDER — HYDRALAZINE HCL 100 MG PO TABS: 100.0000 mg | ORAL_TABLET | Freq: Three times a day (TID) | ORAL | 2 refills | Status: DC

## 2016-04-26 MED FILL — CARVEDILOL 6.25 MG TABLET: 6.25 | 30 days supply | Qty: 60 | Fill #0

## 2016-04-26 MED FILL — hydrALAZINE HCL 100 MG TABS: 100 | 25 days supply | Qty: 90 | Fill #0

## 2016-04-26 NOTE — Progress Notes (Signed)
Patient brought FMLA papers with him to his visit.  These have been completed and faxed back to [employer] @ [phone]. Copy provided to patient.

## 2016-04-26 NOTE — Patient Instructions (Addendum)
Labs today (will call for abnormal results, otherwise no news is good news)  STOP taking potassium (KCL)  Increase Hydralazine to 100 mg (1 Tablet) Three Times Daily  Increase Carvedilol to 6.25 mg (1 Tablet) Two times Daily  Increase Torsemide to 80 mg (4 Tablets) Daily for 3 days only, then resume current dose of 60 mg (3 Tablets) once Daily.  We have Referred you to see a Nephrologist.  They will contact you to set up your initial appointment with them.   Follow up in 3 weeks

## 2016-04-26 NOTE — Progress Notes (Signed)
Paramedicine Encounter   Patient ID: Travis Bryan , male,   DOB: November 01, 1970,45 y.o.,  MRN: [id number]   Met patient in clinic today with provider.  There were some med changes today and I will follow up on Thursday for bp check and to verify he has been doing the med changes at home. Last week during our visit his b/p was elevated and his hydralazine was increased to 181m TID per the clinic but pt states he hasnt been doing that at home.   Weight today @ clinic-228 Last visit-217 Not weighing daily-didn't weigh yesterday-dont recall last time he weighed CBG PTA-121 @ home this morning Time spent with patient [id number] 04/26/2016

## 2016-04-26 NOTE — Progress Notes (Signed)
CSW met with patient and paramedic in the clinic today. Patient states he is doing well and has his medications. Katie paramedic has made a home visit and working on medication compliance as well as additional education and patient completing daily weights. Patient is very pleasant and grateful for the assistance with paramedicine program. Patient is hopeful to return to work asap. CSW discussed importance of medications and reaching out to the clinic for financial assistance if needed. Patient verbalizes understanding and will continue to be followed through the clinic and paramedicine program. CSW will coordinate with paramedicine as needed. Raquel Sarna, [place], [place]

## 2016-04-26 NOTE — Progress Notes (Signed)
PCP: Dr. Martyn Malay HF Cardiology: Dr. Aundra Dubin EP: Dr. Lovena Le  46 yo with history of poorly controlled DM, atrial flutter and fibrillation, nonischemic cardiomyopathy, and CKD presents for cardiology followup.  Cardiomyopathy has been known for a number of years. Cath in 6/06 showed no obstructive coronary disease.  He had ICD discharge for VT in 10/17 and again in 1/18, both times likely in the setting of CHF exacerbation.    He was admitted in 1/18 with exertional dyspnea/CHF exacerbation.  ICD had also discharged for VT.  He was noted to be in new atrial flutter.  He had a flutter ablation.  However, post-procedure, he developed AKI and cardiogenic shock.  Dobutamine and norepinephrine were required but eventually titrated off.  He developed atrial fibrillation subsequent to the atrial flutter ablation.  He had DCCV to NSR 04/11/16.  He is on amiodarone and ranolazine.   Today, he remains in NSR.  BP is elevated despite taking meds. He says that he is doing well.  No dyspnea walking on flat ground.  No chest pain.  No orthopnea/PND.  No palpitations.  He wants to go back to work.  No BRBPR or melena.   Labs (2/18): hgb 14.6, K 4.9, creatinine 3.43  ECG: NSR, 1st degree AVB, IVCD 188 msec, QTc 537  Corevue: impedance falling suggestive of fluid overload.   PMH: 1. Chronic systolic CHF: Nonischemic cardiomyopathy.  St Jude ICD.  - LHC (6/06): Nonobstructive CAD.  - Echo (1/18): EF 20%, moderate RV dilation with moderate-severely decreased RV systolic function, PASP 48 mmHg.  - Cardiolite (1/18): EF 7%, large fixed inferolateral defect no ischemia.  2. Atrial flutter: s/p ablation 1/18.  3. Atrial fibrillation: Paroxysmal.  S/p DCCV 1/18.  4. CKD: Stage IV. 5. Type II diabetes: Poorly controlled over time.  6. VT: 10/17 and 1/18 with ICD discharge.    Social History   Social History  . Marital status: Widowed    Spouse name: N/A  . Number of children: N/A  . Years of education: N/A    Social History Main Topics  . Smoking status: Former Smoker    Years: 10.00    Types: Cigars    Quit date: 07/28/2002  . Smokeless tobacco: Never Used  . Alcohol use Yes     Comment: 03/28/2016 "stopped drinking 4-5 months ago"  . Drug use: No  . Sexual activity: Not Currently   Other Topics Concern  . None   Social History Narrative  . None   Family History  Problem Relation Age of Onset  . Hypertension Mother   . Heart disease Mother   . Diabetes Mother    ROS: All systems reviewed and negative except as per HPI.   Current Outpatient Prescriptions  Medication Sig Dispense Refill  . amiodarone (PACERONE) 200 MG tablet Take 400 mg (2 tabs) BID until 04/19/16. Then take 200 mg (1 tab) BID until 04/26/16.  Then take 200 mg daily. 62 tablet 3  . carvedilol (COREG) 6.25 MG tablet Take 1 tablet (6.25 mg total) by mouth 2 (two) times daily with a meal. 60 tablet 3  . hydrALAZINE (APRESOLINE) 100 MG tablet Take 1 tablet (100 mg total) by mouth 3 (three) times daily. 90 tablet 2  . insulin aspart (NOVOLOG) 100 UNIT/ML injection Inject 6 Units into the skin 3 (three) times daily with meals. 10 mL 11  . insulin aspart (NOVOLOG) 100 UNIT/ML injection Inject 0-20 Units into the skin 3 (three) times daily with meals. 10  mL 11  . insulin glargine (LANTUS) 100 UNIT/ML injection Inject 0.25 mLs (25 Units total) into the skin at bedtime. 10 mL 11  . isosorbide mononitrate (IMDUR) 30 MG 24 hr tablet Take 3 tablets (90 mg total) by mouth daily. 270 tablet 3  . magnesium oxide (MAG-OX) 400 (241.3 Mg) MG tablet Take 1 tablet (400 mg total) by mouth daily. 30 tablet 6  . ranolazine (RANEXA) 500 MG 12 hr tablet Take 1 tablet (500 mg total) by mouth 2 (two) times daily. 60 tablet 6  . torsemide (DEMADEX) 20 MG tablet Take 3 tablets (60 mg total) by mouth daily. 90 tablet 6  . warfarin (COUMADIN) 10 MG tablet Take 1 tablet (10 mg total) by mouth daily at 6 PM. Or as directed by Coumadin clinic 40 tablet 6    No current facility-administered medications for this encounter.    BP (!) 160/90   Pulse 85   Wt 228 lb (103.4 kg)   SpO2 98%   BMI 32.71 kg/m  General: NAD Neck: No JVD, no thyromegaly or thyroid nodule.  Lungs: Clear to auscultation bilaterally with normal respiratory effort. CV: Nondisplaced PMI.  Heart regular S1/S2, no S3/S4, no murmur.  No peripheral edema.  No carotid bruit.  Normal pedal pulses.  Abdomen: Soft, nontender, no hepatosplenomegaly, no distention.  Skin: Intact without lesions or rashes.  Neurologic: Alert and oriented x 3.  Psych: Normal affect. Extremities: No clubbing or cyanosis.  HEENT: Normal.   Assessment/Plan: 1. Chronic systolic CHF: Nonischemic cardiomyopathy.  St Jude ICD.  Echo 1/18 with EF 20% and moderate to severely decreased RV systolic function.  On exam, he does not appear significantly volume overloaded but Corevue suggests that fluid is building up.  NYHA class II symptoms.  - Increase torsemide to 80 mg daily x 3 days then back to 60 mg daily.  BMET today.  - Stop KCl.  - Increase Coreg to 6.25 mg bid.  - Increase hydralazine to 100 mg tid and continue Imdur 90 daily.  - No ACEI/ARB/ARNI or spironolactone with CKD.  - Wide QRS today, 188 msec with IVCD.  Will discuss with Dr Lovena Le regarding benefit to upgrading device to CRT.  2. Atrial fibrillation: He is in NSR today.   - Continue amiodarone.  He is on a tapering dose.  Check LFTs and TSH today.  He will need a regular eye exam.  - Continue warfarin.   3. HTN: BP elevated today, increasing meds as above.  4. Diabetes: He now has a PCP.  5. VT: Now on amiodarone.  6. CKD: Stage IV.  BMET today.  Needs nephrology referral, I will make today.   Loralie Champagne 04/26/2016

## 2016-04-27 ENCOUNTER — Encounter (INDEPENDENT_AMBULATORY_CARE_PROVIDER_SITE_OTHER): Payer: Self-pay

## 2016-04-27 ENCOUNTER — Telehealth (HOSPITAL_COMMUNITY): Payer: Self-pay | Admitting: Vascular Surgery

## 2016-04-27 ENCOUNTER — Encounter (HOSPITAL_COMMUNITY): Payer: Self-pay | Admitting: *Deleted

## 2016-04-27 ENCOUNTER — Telehealth (HOSPITAL_COMMUNITY): Payer: Self-pay | Admitting: *Deleted

## 2016-04-27 ENCOUNTER — Ambulatory Visit (INDEPENDENT_AMBULATORY_CARE_PROVIDER_SITE_OTHER): Payer: BLUE CROSS/BLUE SHIELD | Admitting: *Deleted

## 2016-04-27 ENCOUNTER — Encounter: Payer: Self-pay | Admitting: Cardiology

## 2016-04-27 DIAGNOSIS — Z95810 Presence of automatic (implantable) cardiac defibrillator: Secondary | ICD-10-CM

## 2016-04-27 DIAGNOSIS — Z5181 Encounter for therapeutic drug level monitoring: Secondary | ICD-10-CM

## 2016-04-27 DIAGNOSIS — I483 Typical atrial flutter: Secondary | ICD-10-CM

## 2016-04-27 DIAGNOSIS — I481 Persistent atrial fibrillation: Secondary | ICD-10-CM

## 2016-04-27 DIAGNOSIS — I4819 Other persistent atrial fibrillation: Secondary | ICD-10-CM

## 2016-04-27 LAB — PROTIME-INR
INR: 5.6 — AB (ref <=1.1)
INR: 5.6 — ABNORMAL HIGH (ref 0.8–1.2)
PROTHROMBIN TIME: 55.4 s — AB (ref 9.1–12.0)

## 2016-04-27 LAB — POCT INR: INR: 6

## 2016-04-27 NOTE — Telephone Encounter (Signed)
Pt wants a letter stating he can go back to work , he is Dr. Aundra Dubin.Marland Kitchen Please advise

## 2016-04-27 NOTE — Progress Notes (Signed)
Remote ICD transmission.   

## 2016-04-27 NOTE — Telephone Encounter (Signed)
Letter completed and left at the front desk.  I have called patient and left VM letting him know the letter is ready for him to pick up.

## 2016-04-27 NOTE — Telephone Encounter (Signed)
Referral form and required documents faxed to [place] @ [phone].

## 2016-05-03 ENCOUNTER — Other Ambulatory Visit (HOSPITAL_COMMUNITY): Payer: Self-pay

## 2016-05-03 ENCOUNTER — Telehealth (HOSPITAL_COMMUNITY): Payer: Self-pay | Admitting: *Deleted

## 2016-05-03 MED FILL — ISOSORBIDE MN ER 30 MG TAB: 30 | 30 days supply | Qty: 90 | Fill #1

## 2016-05-03 MED FILL — TORSEMIDE 20 MG TABLET: 20 | 30 days supply | Qty: 90 | Fill #1

## 2016-05-03 NOTE — Telephone Encounter (Signed)
Katie left a VM earlier stating pt's wt is up from 217 lb to 223 lb in 1 week.  Pt denied SOB but does have LE edema and abd distention.  BP 152/110.  Pt has been taking meds.  Discussed all above w/Andy Chalmers Cater, PA he recommends pt increase Torsemide to 80 mg daily for 3 days then return to 60 mg daily, paramedicine f/u next week.  Spoke w/Katie, she is aware and agreeable, she will let pt know of med changes and will see him again next week, she will let us know if not improving.

## 2016-05-03 NOTE — Progress Notes (Signed)
Internal Medicine Clinic Attending  Case discussed with Dr. Burns soon after the resident saw the patient.  We reviewed the resident's history and exam and pertinent patient test results.  I agree with the assessment, diagnosis, and plan of care documented in the resident's note. 

## 2016-05-03 NOTE — Progress Notes (Signed)
Paramedicine Encounter    Patient ID: Travis Bryan, male    DOB: 15-Jul-1970, 46 y.o.   MRN: [id number]   Patient Care Team: Zada Finders, MD as PCP - General (Internal Medicine)  Patient Active Problem List   Diagnosis Date Noted  . Persistent atrial fibrillation ([place])   . Chronic kidney disease, stage IV (severe) ([place])   . Typical atrial flutter ([place])   . CARDIOMYOPATHY, SECONDARY 07/03/2008  . SYSTOLIC HEART FAILURE, CHRONIC 07/03/2008  . Automatic implantable cardioverter-defibrillator in situ 07/03/2008  . Type 2 diabetes mellitus ([place]) 12/26/2005  . DYSLIPIDEMIA 12/26/2005  . OBESITY 12/26/2005  . Essential hypertension 12/26/2005    Current Outpatient Prescriptions:  .  amiodarone (PACERONE) 200 MG tablet, Take 400 mg (2 tabs) BID until 04/19/16. Then take 200 mg (1 tab) BID until 04/26/16.  Then take 200 mg daily., Disp: 62 tablet, Rfl: 3 .  carvedilol (COREG) 6.25 MG tablet, Take 1 tablet (6.25 mg total) by mouth 2 (two) times daily with a meal., Disp: 60 tablet, Rfl: 3 .  hydrALAZINE (APRESOLINE) 100 MG tablet, Take 1 tablet (100 mg total) by mouth 3 (three) times daily., Disp: 90 tablet, Rfl: 2 .  insulin aspart (NOVOLOG) 100 UNIT/ML injection, Inject 6 Units into the skin 3 (three) times daily with meals., Disp: 10 mL, Rfl: 11 .  insulin aspart (NOVOLOG) 100 UNIT/ML injection, Inject 0-20 Units into the skin 3 (three) times daily with meals., Disp: 10 mL, Rfl: 11 .  insulin glargine (LANTUS) 100 UNIT/ML injection, Inject 0.25 mLs (25 Units total) into the skin at bedtime., Disp: 10 mL, Rfl: 11 .  isosorbide mononitrate (IMDUR) 30 MG 24 hr tablet, Take 3 tablets (90 mg total) by mouth daily., Disp: 270 tablet, Rfl: 3 .  magnesium oxide (MAG-OX) 400 (241.3 Mg) MG tablet, Take 1 tablet (400 mg total) by mouth daily., Disp: 30 tablet, Rfl: 6 .  ranolazine (RANEXA) 500 MG 12 hr tablet, Take 1 tablet (500 mg total) by mouth 2 (two) times daily., Disp: 60 tablet, Rfl: 6 .  torsemide  (DEMADEX) 20 MG tablet, Take 3 tablets (60 mg total) by mouth daily., Disp: 90 tablet, Rfl: 6 .  warfarin (COUMADIN) 10 MG tablet, Take 1 tablet (10 mg total) by mouth daily at 6 PM. Or as directed by Coumadin clinic, Disp: 40 tablet, Rfl: 6 Allergies  Allergen Reactions  . No Known Allergies      Social History   Social History  . Marital status: Widowed    Spouse name: N/A  . Number of children: N/A  . Years of education: N/A   Occupational History  . Not on file.   Social History Main Topics  . Smoking status: Former Smoker    Years: 10.00    Types: Cigars    Quit date: 07/28/2002  . Smokeless tobacco: Never Used  . Alcohol use Yes     Comment: 03/28/2016 "stopped drinking 4-5 months ago"  . Drug use: No  . Sexual activity: Not Currently   Other Topics Concern  . Not on file   Social History Narrative  . No narrative on file    Physical Exam  Constitutional: He is oriented to person, place, and time. He appears well-developed.  Eyes: Pupils are equal, round, and reactive to light.  Neck: No JVD present.  Cardiovascular: Normal rate and regular rhythm.   Pulmonary/Chest: Effort normal and breath sounds normal. No respiratory distress. He has no wheezes. He has no rales.  Abdominal: He  exhibits distension.  Musculoskeletal: He exhibits edema.  Neurological: He is oriented to person, place, and time.  Skin: Skin is warm and dry.        SAFE - 04/26/16 1100      Situation   Admitting diagnosis chf   Heart failure history Exisiting   Comorbidities Atrial fibillation;CKD/renal insufficiency;DM;HTN;Other  pacemaker/defib   Readmitted within 30 days Yes   Hospital admission within past 12 months Yes     Assessment   Lives alone No   Primary support person aunt/uncle   Mode of transportation personal car   Other services involved None   Home equipement Scale     Weight   Weighs self daily No   Scale provided No  has own scales   Records on weight chart  No     Resources   Has "Living better w/heart failure" book Yes   Has HF Zone tool Yes   Able to identify yellow zone signs/when to call MD Yes   Records zone daily No     Medications   Uses a pill box Yes   Who stocks the pill box aunt/uncle   Pill box checked this visit No  did not bring to appointment   Difficulty obtaining medications No   Missed one or more doses of medications per week Yes   How many missed doses this week --  was not doing the increased dose of hydralazine     Nutrition   Patient receives meals on wheels No   Patient follows low sodium diet No   Has foods at home that meet the current recommended diet No   Patient follows low sugar/card diet No   Nutritional concerns/issues y     Activity Level   ADL's/Mobility Independent   How many feet can patient ambulate >73ft   Typical activity level active   Barriers none     Urine   Difficulty urinating Yes   Changes in urine None     Time spent with patient   Time spent with patient  45 Minutes        Future Appointments Date Time Provider [place]  05/05/2016 9:40 AM Luetta Nutting Sena Slate, NP [facility]  05/05/2016 10:45 AM [facility]IN [facility]  05/19/2016 2:20 PM [facility] None  06/21/2016 1:15 PM Zada Finders, MD [facility]  07/26/2016 11:20 AM [facility] DEVICE REMOTES [facility]   BP (!) 152/110 (Patient Position: Sitting)   Pulse 68   Wt 223 lb 8 oz (101.4 kg)   SpO2 98%   BMI 32.07 kg/m   CBG-127 Weight yesterday-didn't weigh Last visit weight-228 @ clinic   Last home visit 97  Mr. Jeff is seen at home today and reports feeling well. He denied SOB, H/A, and orthopnea. He complained of right side low back pain which lasted "a couple days" but had "mostly" resolved. He had an elevated BP, edema in lower extremities and his abdomen was distended. The triage line at the HF clinic was called and a message was left  regarding these findings. Upon receiving a response from the clinic, Mr. Londo will be contacted by phone with their instructions. Medications in pillbox were verified with no missed doses. Torsemide and isosorbide were ordered during visit as well.  ACTION: Home visit completed Next visit planned for 1 week   Jacquiline Doe, EMT-P Marylouise Stacks, EMT-P

## 2016-05-04 ENCOUNTER — Emergency Department (HOSPITAL_COMMUNITY)
Admission: EM | Admit: 2016-05-04 | Discharge: 2016-05-04 | Disposition: A | Discharge status: Home / Self Care | Payer: BLUE CROSS/BLUE SHIELD | Attending: Emergency Medicine | Admitting: Emergency Medicine

## 2016-05-04 ENCOUNTER — Encounter (HOSPITAL_COMMUNITY): Payer: Self-pay

## 2016-05-04 ENCOUNTER — Emergency Department (HOSPITAL_COMMUNITY): Payer: BLUE CROSS/BLUE SHIELD

## 2016-05-04 DIAGNOSIS — E1122 Type 2 diabetes mellitus with diabetic chronic kidney disease: Secondary | ICD-10-CM | POA: Diagnosis not present

## 2016-05-04 DIAGNOSIS — I252 Old myocardial infarction: Secondary | ICD-10-CM | POA: Diagnosis not present

## 2016-05-04 DIAGNOSIS — N184 Chronic kidney disease, stage 4 (severe): Secondary | ICD-10-CM | POA: Insufficient documentation

## 2016-05-04 DIAGNOSIS — M25551 Pain in right hip: Secondary | ICD-10-CM | POA: Insufficient documentation

## 2016-05-04 DIAGNOSIS — I130 Hypertensive heart and chronic kidney disease with heart failure and stage 1 through stage 4 chronic kidney disease, or unspecified chronic kidney disease: Secondary | ICD-10-CM | POA: Diagnosis not present

## 2016-05-04 DIAGNOSIS — Z95810 Presence of automatic (implantable) cardiac defibrillator: Secondary | ICD-10-CM | POA: Insufficient documentation

## 2016-05-04 DIAGNOSIS — Z87891 Personal history of nicotine dependence: Secondary | ICD-10-CM | POA: Diagnosis not present

## 2016-05-04 DIAGNOSIS — I5022 Chronic systolic (congestive) heart failure: Secondary | ICD-10-CM | POA: Diagnosis not present

## 2016-05-04 DIAGNOSIS — Z7901 Long term (current) use of anticoagulants: Secondary | ICD-10-CM | POA: Diagnosis not present

## 2016-05-04 DIAGNOSIS — Z794 Long term (current) use of insulin: Secondary | ICD-10-CM | POA: Diagnosis not present

## 2016-05-04 LAB — URINALYSIS, ROUTINE W REFLEX MICROSCOPIC
Bilirubin Urine: NEGATIVE
GLUCOSE, UA: NEGATIVE mg/dL
HGB URINE DIPSTICK: NEGATIVE
KETONES UR: NEGATIVE mg/dL
LEUKOCYTES UA: NEGATIVE
Nitrite: NEGATIVE
PROTEIN: NEGATIVE mg/dL
Specific Gravity, Urine: 1.006 (ref 1.005–1.030)
pH: 5 (ref 5.0–8.0)

## 2016-05-04 MED ORDER — LIDOCAINE 5 % EX PTCH: 1.0000 | MEDICATED_PATCH | CUTANEOUS | 0 refills | Status: DC

## 2016-05-04 MED ORDER — LIDOCAINE 5 % EX PTCH
1.0000 | MEDICATED_PATCH | CUTANEOUS | Status: DC
  Administered 2016-05-04: 1 via TRANSDERMAL
  Filled 2016-05-04: qty 1

## 2016-05-04 NOTE — ED Provider Notes (Signed)
[facility] DEPT Provider Note   CSN: [id number] Arrival date & time: 05/04/16  1115  By signing my name below, I, Neta Mends, attest that this documentation has been prepared under the direction and in the presence of Shawn Joy, PA-C. Electronically Signed: Neta Mends, ED Scribe. 05/04/2016. 12:14 PM.   History   Chief Complaint Chief Complaint  Patient presents with  . Hip Pain   The history is provided by the patient. No language interpreter was used.   HPI Comments:  Travis Bryan is a 46 y.o. male who presents to the Emergency Department complaining of constant pain to his right hip/buttocks x 4 days. Pain began as an aching that increased in intensity. It originally began after he was sitting on a hard chair for an extended period of time. Current pain is 8 out of 10, aching, radiating down the back of the right leg. Pain starts in the posterior right hip/lower back and radiates into the right buttocks and posterior upper right leg. He endorses previous back pain, but none with similar radiation. He has been ambulatory since the pain began. He has taken Tylenol intermittently without complete relief. He denies falls/trauma, lower extremity swelling, changes in bowel or bladder function, urinary symptoms, neuro deficits, or any other complaints.    Past Medical History:  Diagnosis Date  . AICD (automatic cardioverter/defibrillator) present   . Atrial flutter ([place])    a. s/p ablation 03/2016  . Chronic systolic CHF (congestive heart failure) ([place])   . History of gout   . Hyperlipidemia   . Hypertension   . Myocardial infarction    "I think I had one a long long time ago" (03/28/2016)  . NICM (nonischemic cardiomyopathy) ([place])    a. LHC 6/06: pLAD 20, pLCx 20-30; b. Echo 5/15:  EF 15%, diffuse HK, restrictive physiology, trivial AI, trivial MR, mild LAE, moderate RVE, moderately reduced RVSF, moderate RAE, mild to moderate TR, PASP 43 mmHg  . Obesity   .  Persistent atrial fibrillation ([place])   . Type II diabetes mellitus [facility])     Patient Active Problem List   Diagnosis Date Noted  . Persistent atrial fibrillation ([place])   . Chronic kidney disease, stage IV (severe) ([place])   . Typical atrial flutter ([place])   . CARDIOMYOPATHY, SECONDARY 07/03/2008  . SYSTOLIC HEART FAILURE, CHRONIC 07/03/2008  . Automatic implantable cardioverter-defibrillator in situ 07/03/2008  . Type 2 diabetes mellitus ([place]) 12/26/2005  . DYSLIPIDEMIA 12/26/2005  . OBESITY 12/26/2005  . Essential hypertension 12/26/2005    Past Surgical History:  Procedure Laterality Date  . CARDIOVERSION N/A 04/07/2016   Procedure: CARDIOVERSION;  Surgeon: Thayer Headings, MD;  Location[facility] ENDOSCOPY;  Service: Cardiovascular;  Laterality: N/A;  . CARDIOVERSION N/A 04/11/2016   Procedure: CARDIOVERSION;  Surgeon: Larey Dresser, MD;  Location: [facility];  Service: Cardiovascular;  Laterality: N/A;  . ELECTROPHYSIOLOGIC STUDY N/A 03/31/2016   Procedure: A-Flutter Ablation;  Surgeon: Evans Lance, MD;  Location: [place] CV LAB;  Service: Cardiovascular;  Laterality: N/A;  . IMPLANTABLE CARDIOVERTER DEFIBRILLATOR (ICD) GENERATOR CHANGE N/A 08/27/2013   Procedure: ICD GENERATOR CHANGE;  Surgeon: Evans Lance, MD;  Location: [facility] CATH LAB;  Service: Cardiovascular;  Laterality: N/A;  . TEE WITHOUT CARDIOVERSION N/A 03/31/2016   Procedure: TRANSESOPHAGEAL ECHOCARDIOGRAM (TEE);  Surgeon: Larey Dresser, MD;  Location: [facility] ENDOSCOPY;  Service: Cardiovascular;  Laterality: N/A;       Home Medications    Prior to Admission medications  Medication Sig Start Date End Date Taking? Authorizing Provider  amiodarone (PACERONE) 200 MG tablet Take 200 mg by mouth daily.    Historical Provider, MD  carvedilol (COREG) 6.25 MG tablet Take 1 tablet (6.25 mg total) by mouth 2 (two) times daily with a meal. 04/26/16   Larey Dresser, MD  hydrALAZINE (APRESOLINE) 100 MG tablet Take 1 tablet  (100 mg total) by mouth 3 (three) times daily. 04/26/16 07/25/16  Larey Dresser, MD  insulin aspart (NOVOLOG) 100 UNIT/ML injection Inject 6 Units into the skin 3 (three) times daily with meals. 04/14/16   Shirley Friar, PA-C  insulin glargine (LANTUS) 100 UNIT/ML injection Inject 0.25 mLs (25 Units total) into the skin at bedtime. 04/14/16   Shirley Friar, PA-C  isosorbide mononitrate (IMDUR) 30 MG 24 hr tablet Take 3 tablets (90 mg total) by mouth daily. 04/14/16 07/13/16  Shirley Friar, PA-C  lidocaine (LIDODERM) 5 % Place 1 patch onto the skin daily. Remove & Discard patch within 12 hours or as directed by MD 05/04/16   Lorayne Bender, PA-C  magnesium oxide (MAG-OX) 400 (241.3 Mg) MG tablet Take 1 tablet (400 mg total) by mouth daily. 04/15/16   Shirley Friar, PA-C  ranolazine (RANEXA) 500 MG 12 hr tablet Take 1 tablet (500 mg total) by mouth 2 (two) times daily. 04/14/16   Shirley Friar, PA-C  torsemide (DEMADEX) 20 MG tablet Take 3 tablets (60 mg total) by mouth daily. 04/26/16   Larey Dresser, MD  warfarin (COUMADIN) 5 MG tablet Take 1 tablet (5 mg total) by mouth daily. Take As Directed by Coumadin Clinic 05/05/16   Evans Lance, MD    Family History Family History  Problem Relation Age of Onset  . Hypertension Mother   . Heart disease Mother   . Diabetes Mother     Social History Social History  Substance Use Topics  . Smoking status: Former Smoker    Years: 10.00    Types: Cigars    Quit date: 07/28/2002  . Smokeless tobacco: Never Used  . Alcohol use No     Comment: 03/28/2016 "stopped drinking 4-5 months ago"     Allergies   No known allergies   Review of Systems Review of Systems  Constitutional: Negative for chills and fever.  Gastrointestinal: Negative for abdominal pain, constipation, diarrhea, nausea and vomiting.  Genitourinary: Negative for difficulty urinating, discharge and dysuria.  Musculoskeletal: Positive for arthralgias  and back pain. Negative for joint swelling.  Neurological: Negative for weakness and numbness.     Physical Exam Updated Vital Signs BP 124/85 (BP Location: Left Arm)   Pulse 73   Temp 98.6 F (37 C) (Oral)   Resp 20   Ht 5\' 10"  (1.778 m)   Wt 223 lb (101.2 kg)   SpO2 94%   BMI 32.00 kg/m   Physical Exam  Constitutional: He appears well-developed and well-nourished. No distress.  HENT:  Head: Normocephalic and atraumatic.  Eyes: Conjunctivae are normal.  Neck: Neck supple.  Cardiovascular: Normal rate and regular rhythm.   Pulmonary/Chest: Effort normal.  Musculoskeletal: He exhibits tenderness.  Tenderness to the right lower back and into the right buttocks. No tenderness to right lateral or anterior hip, abdomen, or groin. Increased pain to the region with external rotation of the right hip. No erythema, swelling, or increased warmth in the hip. Normal motor function intact in left lower extremity and spine. No midline spinal tenderness.   Lymphadenopathy:  Right: No inguinal adenopathy present.       Left: No inguinal adenopathy present.  Neurological: He is alert.  No sensory deficits. Strength 5/5 in both lower extremities. No gait disturbance. Coordination intact including heel to shin.  Skin: Skin is warm and dry. He is not diaphoretic.  Psychiatric: He has a normal mood and affect. His behavior is normal.  Nursing note and vitals reviewed.    ED Treatments / Results  DIAGNOSTIC STUDIES:  Oxygen Saturation is 94% on RA, adequate by my interpretation.    COORDINATION OF CARE:  12:14 PM Will order xray. Discussed treatment plan with pt at bedside and pt agreed to plan.   Labs (all labs ordered are listed, but only abnormal results are displayed) Labs Reviewed  URINALYSIS, ROUTINE W REFLEX MICROSCOPIC    EKG  EKG Interpretation None       Radiology No results found.  Procedures Procedures (including critical care time)  Medications Ordered  in ED Medications - No data to display   Initial Impression / Assessment and Plan / ED Course  I have reviewed the triage vital signs and the nursing notes.  Pertinent labs & imaging results that were available during my care of the patient were reviewed by me and considered in my medical decision making (see chart for details).     Patient presents with right lower back pain extending into the hip and the right upper leg. Very low suspicion for septic joint. Patient has no wound or point of entry. He has no previous history of gonorrhea, history of joint surgery, or injury to the area. The area is not exquisitely tender. He has full range of motion with very little reaction. Patient is nontoxic appearing, afebrile, not tachycardic, not tachypneic, not hypotensive, maintains adequate SPO2 on room air, and is in no apparent distress.  Treatment of patient's pain limited by his medical history. Patient has access to close PCP follow-up and was encouraged to do so. Orthopedic follow-up also presented as an option. Return precautions discussed. Pt voices understanding of all instructions and is comfortable with discharge.    Vitals:   05/04/16 1129 05/04/16 1134 05/04/16 1433  BP: 124/85  115/66  Pulse: 73  90  Resp: 20  18  Temp: 98.6 F (37 C)    TempSrc: Oral    SpO2: 94%  100%  Weight:  101.2 kg   Height:  5\' 10"  (1.778 m)      Final Clinical Impressions(s) / ED Diagnoses   Final diagnoses:  Right hip pain    New Prescriptions Discharge Medication List as of 05/04/2016  2:22 PM    START taking these medications   Details  lidocaine (LIDODERM) 5 % Place 1 patch onto the skin daily. Remove & Discard patch within 12 hours or as directed by MD, Starting Thu 05/04/2016, Print       I personally performed the services described in this documentation, which was scribed in my presence. The recorded information has been reviewed and is accurate.    Lorayne Bender, PA-C 05/07/16  [place], MD 05/08/16 [phone]

## 2016-05-04 NOTE — Progress Notes (Signed)
Electrophysiology Office Note Date: 05/05/2016  ID:  Travis Bryan, DOB [birth year], MRN [id number]  PCP: Zada Finders, MD Primary Cardiologist: Aundra Dubin Electrophysiologist: Lovena Le  CC: hospital and ICD follow-up  Travis Bryan is a 46 y.o. male seen today for Dr Lovena Le. He was admitted last month with atrial flutter and underwent flutter ablation. Post procedure, he developed AKI and cardiogenic shock with subsequent atrial fibrillation.  He was improved and eventually discharged on amiodarone and ranexa.  He presents today for EP post hospital follow up. He has recently been seen by AHF clinic. Since discharge, the patient reports doing very well. He denies chest pain, palpitations, dyspnea, PND, orthopnea, nausea, vomiting, dizziness, syncope, edema, weight gain, or early satiety.  He has not had ICD shocks.   Device History: STJ single chamber ICD implanted 2009 for NICM; gen change 2015 History of appropriate therapy: yes 03/2016 History of AAD therapy: Yes - amiodarone for AF    Past Medical History:  Diagnosis Date  . AICD (automatic cardioverter/defibrillator) present   . Atrial flutter ([place])    a. s/p ablation 03/2016  . Chronic systolic CHF (congestive heart failure) ([place])   . History of gout   . Hyperlipidemia   . Hypertension   . Myocardial infarction    "I think I had one a long long time ago" (03/28/2016)  . NICM (nonischemic cardiomyopathy) ([place])    a. LHC 6/06: pLAD 20, pLCx 20-30; b. Echo 5/15:  EF 15%, diffuse HK, restrictive physiology, trivial AI, trivial MR, mild LAE, moderate RVE, moderately reduced RVSF, moderate RAE, mild to moderate TR, PASP 43 mmHg  . Obesity   . Persistent atrial fibrillation ([place])   . Type II diabetes mellitus ([place])    Past Surgical History:  Procedure Laterality Date  . CARDIOVERSION N/A 04/07/2016   Procedure: CARDIOVERSION;  Surgeon: Thayer Headings, MD;  Location: [facility] ENDOSCOPY;  Service: Cardiovascular;  Laterality: N/A;  .  CARDIOVERSION N/A 04/11/2016   Procedure: CARDIOVERSION;  Surgeon: Larey Dresser, MD;  Location: [facility];  Service: Cardiovascular;  Laterality: N/A;  . ELECTROPHYSIOLOGIC STUDY N/A 03/31/2016   Procedure: A-Flutter Ablation;  Surgeon: Evans Lance, MD;  Location: [place] CV LAB;  Service: Cardiovascular;  Laterality: N/A;  . IMPLANTABLE CARDIOVERTER DEFIBRILLATOR (ICD) GENERATOR CHANGE N/A 08/27/2013   Procedure: ICD GENERATOR CHANGE;  Surgeon: Evans Lance, MD;  Location: [facility] CATH LAB;  Service: Cardiovascular;  Laterality: N/A;  . TEE WITHOUT CARDIOVERSION N/A 03/31/2016   Procedure: TRANSESOPHAGEAL ECHOCARDIOGRAM (TEE);  Surgeon: Larey Dresser, MD;  Location: [facility] ENDOSCOPY;  Service: Cardiovascular;  Laterality: N/A;    Current Outpatient Prescriptions  Medication Sig Dispense Refill  . amiodarone (PACERONE) 200 MG tablet Take 200 mg by mouth daily.    . carvedilol (COREG) 6.25 MG tablet Take 1 tablet (6.25 mg total) by mouth 2 (two) times daily with a meal. 60 tablet 3  . hydrALAZINE (APRESOLINE) 100 MG tablet Take 1 tablet (100 mg total) by mouth 3 (three) times daily. 90 tablet 2  . insulin aspart (NOVOLOG) 100 UNIT/ML injection Inject 6 Units into the skin 3 (three) times daily with meals. 10 mL 11  . insulin glargine (LANTUS) 100 UNIT/ML injection Inject 0.25 mLs (25 Units total) into the skin at bedtime. 10 mL 11  . isosorbide mononitrate (IMDUR) 30 MG 24 hr tablet Take 3 tablets (90 mg total) by mouth daily. 270 tablet 3  . lidocaine (LIDODERM) 5 % Place 1 patch onto the skin daily.  Remove & Discard patch within 12 hours or as directed by MD 30 patch 0  . magnesium oxide (MAG-OX) 400 (241.3 Mg) MG tablet Take 1 tablet (400 mg total) by mouth daily. 30 tablet 6  . ranolazine (RANEXA) 500 MG 12 hr tablet Take 1 tablet (500 mg total) by mouth 2 (two) times daily. 60 tablet 6  . torsemide (DEMADEX) 20 MG tablet Take 3 tablets (60 mg total) by mouth daily. 90 tablet 6  .  warfarin (COUMADIN) 10 MG tablet Take 1 tablet (10 mg total) by mouth daily at 6 PM. Or as directed by Coumadin clinic 40 tablet 6   No current facility-administered medications for this visit.     Allergies:   No known allergies   Social History: Social History   Social History  . Marital status: Widowed    Spouse name: N/A  . Number of children: N/A  . Years of education: N/A   Occupational History  . Not on file.   Social History Main Topics  . Smoking status: Former Smoker    Years: 10.00    Types: Cigars    Quit date: 07/28/2002  . Smokeless tobacco: Never Used  . Alcohol use No     Comment: 03/28/2016 "stopped drinking 4-5 months ago"  . Drug use: No  . Sexual activity: Not Currently   Other Topics Concern  . Not on file   Social History Narrative  . No narrative on file    Family History: Family History  Problem Relation Age of Onset  . Hypertension Mother   . Heart disease Mother   . Diabetes Mother     Review of Systems: All other systems reviewed and are otherwise negative except as noted above.   Physical Exam: VS:  BP (!) 156/98   Pulse 77   Ht 5\' 10"  (1.778 m)   Wt 226 lb (102.5 kg)   SpO2 96%   BMI 32.43 kg/m  , BMI Body mass index is 32.43 kg/m.  GEN- The patient is obese and chronically ill appearing, alert and oriented x 3 today.   HEENT: normocephalic, atraumatic; sclera clear, conjunctiva pink; hearing intact; oropharynx clear; neck supple, poor dentition Lungs- Clear to ausculation bilaterally, normal work of breathing.  No wheezes, rales, rhonchi Heart- Regular rate and rhythm  GI- soft, non-tender, non-distended, bowel sounds present  Extremities- no clubbing, cyanosis, +BLE dependent edema MS- no significant deformity or atrophy Skin- warm and dry, no rash or lesion; ICD pocket well healed Psych- euthymic mood, full affect Neuro- strength and sensation are intact  ICD interrogation- reviewed in detail today,  See PACEART  report  EKG:  EKG is not ordered today.  Recent Labs: 03/28/2016: B Natriuretic Peptide 1,431.5 04/26/2016: ALT 24; BUN 25; Creatinine, Ser 2.79; Hemoglobin 14.4; Magnesium 1.8; Platelets 207; Potassium 4.1; Sodium 141; TSH 4.118   Wt Readings from Last 3 Encounters:  05/05/16 226 lb (102.5 kg)  05/04/16 223 lb (101.2 kg)  05/03/16 223 lb 8 oz (101.4 kg)     Other studies Reviewed: Additional studies/ records that were reviewed today include: hospital records   Assessment and Plan:  1.  Chronic systolic dysfunction euvolemic today Stable on an appropriate medical regimen Normal ICD function See [facility] report No changes today Continue follow up in AHF clinic Will enroll in [facility] clinic today EKG reviewed from last week- LBBB with QRS of >171msec. He would likely benefit from CRT upgrade. A1C during last admission 13.4 which makes infection  risks too high at this point. Would continue medical therapy and work on glucose control.  If A1C improved in several weeks, could consider upgrade at that time.   2.  Persistent atrial fibrillation Continue warfarin for CHADS2VASC of 3 V rates controlled Continue amiodarone 200mg  daily for now.  Recent LFT's, TSH stable.   3.  HTN Stable No change required today  4.  VT No recurrence since discharge Amiodarone as above  5.  CKD, stage IV Referred to nephrology by AHF clinic  Current medicines are reviewed at length with the patient today.   The patient does not have concerns regarding his medicines.  The following changes were made today:  none  Labs/ tests ordered today include: none No orders of the defined types were placed in this encounter.    Disposition:   Follow up with Dr Lovena Le in 6 weeks to consider CRT upgrade if glucose under better control    Signed, Chanetta Marshall, NP 05/05/2016 12:14 PM  [place] [address] [place] [address] [phone] (office) [phone] (fax)

## 2016-05-04 NOTE — ED Notes (Signed)
Declined W/C at D/C and was escorted to lobby by RN. 

## 2016-05-04 NOTE — ED Triage Notes (Signed)
Per Pt, Pt is coming from home with pain in his right buttock. Pt denies injury, fall or rash. Denies any change in bowel or bladder. Reports no relief with Tylenol.

## 2016-05-04 NOTE — Discharge Instructions (Addendum)
There were no acute abnormalities on the x-ray or the urine test today. If you are able to take medications like ibuprofen, these types are medications are best for this type of pain. If you are unable to take these medications, you may take tylenol.  Performed the attached exercises to stretch out the joint and the surrounding muscles. Follow-up with your PCP on this matter as soon as possible. You may also follow-up with orthopedics should symptoms fail to resolve. Return to ED should symptoms worsen.

## 2016-05-05 ENCOUNTER — Ambulatory Visit (INDEPENDENT_AMBULATORY_CARE_PROVIDER_SITE_OTHER): Payer: BLUE CROSS/BLUE SHIELD | Admitting: *Deleted

## 2016-05-05 ENCOUNTER — Ambulatory Visit (INDEPENDENT_AMBULATORY_CARE_PROVIDER_SITE_OTHER): Payer: BLUE CROSS/BLUE SHIELD | Admitting: Nurse Practitioner

## 2016-05-05 ENCOUNTER — Encounter: Payer: Self-pay | Admitting: Nurse Practitioner

## 2016-05-05 VITALS — BP 156/98 | HR 77 | Ht 70.0 in | Wt 226.0 lb

## 2016-05-05 DIAGNOSIS — Z5181 Encounter for therapeutic drug level monitoring: Secondary | ICD-10-CM | POA: Diagnosis not present

## 2016-05-05 DIAGNOSIS — I472 Ventricular tachycardia, unspecified: Secondary | ICD-10-CM

## 2016-05-05 DIAGNOSIS — I5022 Chronic systolic (congestive) heart failure: Secondary | ICD-10-CM | POA: Diagnosis not present

## 2016-05-05 DIAGNOSIS — I10 Essential (primary) hypertension: Secondary | ICD-10-CM

## 2016-05-05 DIAGNOSIS — I4819 Other persistent atrial fibrillation: Secondary | ICD-10-CM

## 2016-05-05 DIAGNOSIS — I483 Typical atrial flutter: Secondary | ICD-10-CM | POA: Diagnosis not present

## 2016-05-05 DIAGNOSIS — N184 Chronic kidney disease, stage 4 (severe): Secondary | ICD-10-CM | POA: Diagnosis not present

## 2016-05-05 DIAGNOSIS — I481 Persistent atrial fibrillation: Secondary | ICD-10-CM | POA: Diagnosis not present

## 2016-05-05 DIAGNOSIS — Z95810 Presence of automatic (implantable) cardiac defibrillator: Secondary | ICD-10-CM

## 2016-05-05 LAB — CUP PACEART REMOTE DEVICE CHECK
Battery Voltage: 2.98 V
Date Time Interrogation Session: 20180214154859
HIGH POWER IMPEDANCE MEASURED VALUE: 48 Ohm
HighPow Impedance: 48 Ohm
Implantable Lead Location: 753860
Implantable Pulse Generator Implant Date: 20150617
Lead Channel Pacing Threshold Pulse Width: 0.5 ms
Lead Channel Sensing Intrinsic Amplitude: 11.8 mV
Lead Channel Setting Pacing Amplitude: 2.5 V
Lead Channel Setting Pacing Pulse Width: 0.5 ms
MDC IDC LEAD IMPLANT DT: 20090211
MDC IDC MSMT BATTERY REMAINING LONGEVITY: 79 mo
MDC IDC MSMT BATTERY REMAINING PERCENTAGE: 78 %
MDC IDC MSMT LEADCHNL RV IMPEDANCE VALUE: 300 Ohm
MDC IDC MSMT LEADCHNL RV PACING THRESHOLD AMPLITUDE: 1 V
MDC IDC SET LEADCHNL RV SENSING SENSITIVITY: 0.5 mV
MDC IDC STAT BRADY RV PERCENT PACED: 1 %
Pulse Gen Serial Number: 7200640

## 2016-05-05 LAB — PROTIME-INR
INR: 6.1 — AB (ref 0.8–1.2)
Prothrombin Time: 60 s — ABNORMAL HIGH (ref 9.1–12.0)

## 2016-05-05 LAB — POCT INR: INR: 8

## 2016-05-05 MED ORDER — WARFARIN SODIUM 5 MG PO TABS: 5.0000 mg | ORAL_TABLET | Freq: Every day | ORAL | 1 refills | Status: DC

## 2016-05-05 MED FILL — WARFARIN SODIUM 5 MG TABLET: 5 | 30 days supply | Qty: 30 | Fill #0

## 2016-05-05 NOTE — Patient Instructions (Signed)
Medication Instructions:   Your physician recommends that you continue on your current medications as directed. Please refer to the Current Medication list given to you today.   If you need a refill on your cardiac medications before your next appointment, please call your pharmacy.  Labwork:   LAB TODAY PER COUMADIN   Testing/Procedures: NONE ORDERED  TODAY    Follow-Up: 6 WEEKS   WITH  DR Lovena Le     Any Other Special Instructions Will Be Listed Below (If Applicable).

## 2016-05-10 ENCOUNTER — Other Ambulatory Visit (HOSPITAL_COMMUNITY): Payer: Self-pay

## 2016-05-10 NOTE — Progress Notes (Signed)
Paramedicine Encounter    Patient ID: Travis Bryan, male    DOB: [birth year], 46 y.o.   MRN: [id number]   Patient Care Team: Zada Finders, MD as PCP - General (Internal Medicine)  Patient Active Problem List   Diagnosis Date Noted  . Persistent atrial fibrillation ([place])   . Chronic kidney disease, stage IV (severe) ([place])   . Typical atrial flutter ([place])   . CARDIOMYOPATHY, SECONDARY 07/03/2008  . SYSTOLIC HEART FAILURE, CHRONIC 07/03/2008  . Automatic implantable cardioverter-defibrillator in situ 07/03/2008  . Type 2 diabetes mellitus ([place]) 12/26/2005  . DYSLIPIDEMIA 12/26/2005  . OBESITY 12/26/2005  . Essential hypertension 12/26/2005    Current Outpatient Prescriptions:  .  amiodarone (PACERONE) 200 MG tablet, Take 200 mg by mouth daily., Disp: , Rfl:  .  carvedilol (COREG) 6.25 MG tablet, Take 1 tablet (6.25 mg total) by mouth 2 (two) times daily with a meal., Disp: 60 tablet, Rfl: 3 .  hydrALAZINE (APRESOLINE) 100 MG tablet, Take 1 tablet (100 mg total) by mouth 3 (three) times daily., Disp: 90 tablet, Rfl: 2 .  insulin aspart (NOVOLOG) 100 UNIT/ML injection, Inject 6 Units into the skin 3 (three) times daily with meals., Disp: 10 mL, Rfl: 11 .  insulin glargine (LANTUS) 100 UNIT/ML injection, Inject 0.25 mLs (25 Units total) into the skin at bedtime., Disp: 10 mL, Rfl: 11 .  isosorbide mononitrate (IMDUR) 30 MG 24 hr tablet, Take 3 tablets (90 mg total) by mouth daily., Disp: 270 tablet, Rfl: 3 .  lidocaine (LIDODERM) 5 %, Place 1 patch onto the skin daily. Remove & Discard patch within 12 hours or as directed by MD, Disp: 30 patch, Rfl: 0 .  magnesium oxide (MAG-OX) 400 (241.3 Mg) MG tablet, Take 1 tablet (400 mg total) by mouth daily., Disp: 30 tablet, Rfl: 6 .  ranolazine (RANEXA) 500 MG 12 hr tablet, Take 1 tablet (500 mg total) by mouth 2 (two) times daily., Disp: 60 tablet, Rfl: 6 .  torsemide (DEMADEX) 20 MG tablet, Take 3 tablets (60 mg total) by mouth daily., Disp: 90  tablet, Rfl: 6 .  warfarin (COUMADIN) 5 MG tablet, Take 1 tablet (5 mg total) by mouth daily. Take As Directed by Coumadin Clinic, Disp: 30 tablet, Rfl: 1 Allergies  Allergen Reactions  . No Known Allergies      Social History   Social History  . Marital status: Widowed    Spouse name: N/A  . Number of children: N/A  . Years of education: N/A   Occupational History  . Not on file.   Social History Main Topics  . Smoking status: Former Smoker    Years: 10.00    Types: Cigars    Quit date: 07/28/2002  . Smokeless tobacco: Never Used  . Alcohol use No     Comment: 03/28/2016 "stopped drinking 4-5 months ago"  . Drug use: No  . Sexual activity: Not Currently   Other Topics Concern  . Not on file   Social History Narrative  . No narrative on file    Physical Exam  Constitutional: He appears well-developed.  Neck: Normal range of motion.  Cardiovascular: Normal rate.   Pulmonary/Chest: Effort normal. No respiratory distress.  Abdominal: He exhibits no distension.  Musculoskeletal: He exhibits no edema.  Neurological: He is alert.  Skin: Skin is warm.        SAFE - 04/26/16 1100      Situation   Admitting diagnosis chf   Heart failure history Exisiting  Comorbidities Atrial fibillation;CKD/renal insufficiency;DM;HTN;Other  pacemaker/defib   Readmitted within 30 days Yes   Hospital admission within past 12 months Yes     Assessment   Lives alone No   Primary support person aunt/uncle   Mode of transportation personal car   Other services involved None   Home equipement Scale     Weight   Weighs self daily No   Scale provided No  has own scales   Records on weight chart No     Resources   Has "Living better w/heart failure" book Yes   Has HF Zone tool Yes   Able to identify yellow zone signs/when to call MD Yes   Records zone daily No     Medications   Uses a pill box Yes   Who stocks the pill box aunt/uncle   Pill box checked this visit No   did not bring to appointment   Difficulty obtaining medications No   Missed one or more doses of medications per week Yes   How many missed doses this week --  was not doing the increased dose of hydralazine     Nutrition   Patient receives meals on wheels No   Patient follows low sodium diet No   Has foods at home that meet the current recommended diet No   Patient follows low sugar/card diet No   Nutritional concerns/issues y     Activity Level   ADL's/Mobility Independent   How many feet can patient ambulate >41ft   Typical activity level active   Barriers none     Urine   Difficulty urinating Yes   Changes in urine None     Time spent with patient   Time spent with patient  45 Minutes        Future Appointments Date Time Provider [place]  05/12/2016 9:15 AM [facility]  05/19/2016 2:20 PM [place] None  06/21/2016 1:15 PM Zada Finders, MD [facility]  07/26/2016 11:20 AM [facility] DEVICE REMOTES [facility]   There were no vitals taken for this visit. Weight yesterday- Unknown Last visit weight- 223 lb  Routine visit with Travis Bryan. He denies any SOB, H/A, dizziness or bleeding. Medication box was refilled and was Ranexa ordered. He has enough to last until Monday and stated he will be able to pick up before then. The edema which was present at last visit has resolved. No medication doses missed.  Jacquiline Doe, EMT-Paramedic Marylouise Stacks, EMT-Paramedic 05/10/16  ACTION: Home visit completed Next visit planned for 05/15/16

## 2016-05-11 MED FILL — RANEXA ER 500 MG TABLET: 500 | 30 days supply | Qty: 60 | Fill #1

## 2016-05-12 ENCOUNTER — Ambulatory Visit (INDEPENDENT_AMBULATORY_CARE_PROVIDER_SITE_OTHER): Payer: BLUE CROSS/BLUE SHIELD

## 2016-05-12 DIAGNOSIS — I481 Persistent atrial fibrillation: Secondary | ICD-10-CM | POA: Diagnosis not present

## 2016-05-12 DIAGNOSIS — Z5181 Encounter for therapeutic drug level monitoring: Secondary | ICD-10-CM | POA: Diagnosis not present

## 2016-05-12 DIAGNOSIS — I4819 Other persistent atrial fibrillation: Secondary | ICD-10-CM

## 2016-05-12 DIAGNOSIS — I483 Typical atrial flutter: Secondary | ICD-10-CM

## 2016-05-12 DIAGNOSIS — Z95810 Presence of automatic (implantable) cardiac defibrillator: Secondary | ICD-10-CM | POA: Diagnosis not present

## 2016-05-12 LAB — POCT INR: INR: 1.7

## 2016-05-15 ENCOUNTER — Other Ambulatory Visit (HOSPITAL_COMMUNITY): Payer: Self-pay

## 2016-05-15 NOTE — Progress Notes (Addendum)
Paramedicine Encounter    Patient ID: Travis Bryan, male    DOB: 1971/01/30, 46 y.o.   MRN: [id number]   Patient Care Team: Zada Finders, MD as PCP - General (Internal Medicine)  Patient Active Problem List   Diagnosis Date Noted  . Persistent atrial fibrillation ([place])   . Chronic kidney disease, stage IV (severe) ([place])   . Typical atrial flutter ([place])   . CARDIOMYOPATHY, SECONDARY 07/03/2008  . SYSTOLIC HEART FAILURE, CHRONIC 07/03/2008  . Automatic implantable cardioverter-defibrillator in situ 07/03/2008  . Type 2 diabetes mellitus ([place]) 12/26/2005  . DYSLIPIDEMIA 12/26/2005  . OBESITY 12/26/2005  . Essential hypertension 12/26/2005    Current Outpatient Prescriptions:  .  amiodarone (PACERONE) 200 MG tablet, Take 200 mg by mouth daily., Disp: , Rfl:  .  carvedilol (COREG) 6.25 MG tablet, Take 1 tablet (6.25 mg total) by mouth 2 (two) times daily with a meal., Disp: 60 tablet, Rfl: 3 .  hydrALAZINE (APRESOLINE) 100 MG tablet, Take 1 tablet (100 mg total) by mouth 3 (three) times daily., Disp: 90 tablet, Rfl: 2 .  insulin aspart (NOVOLOG) 100 UNIT/ML injection, Inject 6 Units into the skin 3 (three) times daily with meals., Disp: 10 mL, Rfl: 11 .  insulin glargine (LANTUS) 100 UNIT/ML injection, Inject 0.25 mLs (25 Units total) into the skin at bedtime., Disp: 10 mL, Rfl: 11 .  isosorbide mononitrate (IMDUR) 30 MG 24 hr tablet, Take 3 tablets (90 mg total) by mouth daily., Disp: 270 tablet, Rfl: 3 .  magnesium oxide (MAG-OX) 400 (241.3 Mg) MG tablet, Take 1 tablet (400 mg total) by mouth daily., Disp: 30 tablet, Rfl: 6 .  ranolazine (RANEXA) 500 MG 12 hr tablet, Take 1 tablet (500 mg total) by mouth 2 (two) times daily., Disp: 60 tablet, Rfl: 6 .  torsemide (DEMADEX) 20 MG tablet, Take 3 tablets (60 mg total) by mouth daily., Disp: 90 tablet, Rfl: 6 .  warfarin (COUMADIN) 5 MG tablet, Take 1 tablet (5 mg total) by mouth daily. Take As Directed by [facility]mad[facility] Clinic, Disp: 30 tablet,  Rfl: 1 .  lidocaine (LIDODERM) 5 %, Place 1 patch onto the skin daily. Remove & Discard patch within 12 hours or as directed by MD (Patient not taking: Reported on 05/10/2016), Disp: 30 patch, Rfl: 0 Allergies  Allergen Reactions  . No Known Allergies      Social History   Social History  . Marital status: Widowed    Spouse name: N/A  . Number of children: N/A  . Years of education: N/A   Occupational History  . Not on file.   Social History Main Topics  . Smoking status: Former Smoker    Years: 10.00    Types: Cigars    Quit date: 07/28/2002  . Smokeless tobacco: Never Used  . Alcohol use No     Comment: 03/28/2016 "stopped drinking 4-5 months ago"  . Drug use: No  . Sexual activity: Not Currently   Other Topics Concern  . Not on file   Social History Narrative  . No narrative on file    Physical Exam  Constitutional: He is oriented to person, place, and time. He appears well-developed.  Neck: Normal range of motion.  Cardiovascular: Normal rate.   Pulmonary/Chest: Effort normal and breath sounds normal.  Abdominal: Soft.  Musculoskeletal: Normal range of motion. He exhibits no edema.  Neurological: He is oriented to person, place, and time.  Skin: Skin is warm and dry.  Psychiatric: He has a normal  mood and affect.        SAFE - 04/26/16 1100      Situation   Admitting diagnosis chf   Heart failure history Exisiting   Comorbidities Atrial fibillation;CKD/renal insufficiency;DM;HTN;Other  pacemaker/defib   Readmitted within 30 days Yes   Hospital admission within past 12 months Yes     Assessment   Lives alone No   Primary support person aunt/uncle   Mode of transportation personal car   Other services involved None   Home equipement Scale     Weight   Weighs self daily No   Scale provided No  has own scales   Records on weight chart No     Resources   Has "Living better w/heart failure" book Yes   Has HF Zone tool Yes   Able to identify  yellow zone signs/when to call MD Yes   Records zone daily No     Medications   Uses a pill box Yes   Who stocks the pill box aunt/uncle   Pill box checked this visit No  did not bring to appointment   Difficulty obtaining medications No   Missed one or more doses of medications per week Yes   How many missed doses this week --  was not doing the increased dose of hydralazine     Nutrition   Patient receives meals on wheels No   Patient follows low sodium diet No   Has foods at home that meet the current recommended diet No   Patient follows low sugar/card diet No   Nutritional concerns/issues y     Activity Level   ADL's/Mobility Independent   How many feet can patient ambulate >21ft   Typical activity level active   Barriers none     Urine   Difficulty urinating Yes   Changes in urine None     Time spent with patient   Time spent with patient  45 Minutes        Future Appointments Date Time Provider [place]  05/19/2016 1:45 PM [facility]  05/19/2016 2:20 PM [facility] None  06/21/2016 1:15 PM Zada Finders, MD [facility]  07/26/2016 11:20 AM [facility] DEVICE REMOTES [facility]   BP (!) 144/102   Pulse 64   Resp 16   Wt 225 lb 14.4 oz (102.5 kg)   SpO2 96%   BMI 32.41 kg/m  Weight yesterday-226 Last visit weight-226.5 CBG-120 PTA  Pt reports he is feeling good, pts aunt and himself are doing his pill box without any issues-meds verified and the ones in pill box verified and it was all correct.  No sob, no dizziness, no h/a.  Pt states his low sodium diet is so-so but he is trying to improve that. He states he drinks between 3-4 bottles of liquid a day-advised him to be cautious of the fluid limit and that exceeds it by a small amount and cut back a tad with the fluids. B/p elevated-he states he just got back in a few min before I arrived-states he took his meds around 9am.  No edema  today.  Checked his b/p after a few min of him resting and it did come down,  I left a message with heart clinic-he also has f/u clinic appoint this week.   170/100 144/102      Marylouise Stacks, EMT-Paramedic  05/15/16

## 2016-05-18 MED FILL — KLOR-CON M20 TABLET: 20 | 30 days supply | Qty: 30 | Fill #0

## 2016-05-19 ENCOUNTER — Encounter (HOSPITAL_COMMUNITY): Payer: Self-pay

## 2016-05-19 ENCOUNTER — Ambulatory Visit (HOSPITAL_COMMUNITY)
Admission: RE | Admit: 2016-05-19 | Discharge: 2016-05-19 | Disposition: A | Discharge status: Home / Self Care | Payer: BLUE CROSS/BLUE SHIELD | Source: Ambulatory Visit | Attending: Cardiology | Admitting: Cardiology

## 2016-05-19 ENCOUNTER — Ambulatory Visit (INDEPENDENT_AMBULATORY_CARE_PROVIDER_SITE_OTHER): Payer: BLUE CROSS/BLUE SHIELD | Admitting: *Deleted

## 2016-05-19 VITALS — BP 178/120 | HR 69 | Wt 231.0 lb

## 2016-05-19 DIAGNOSIS — I472 Ventricular tachycardia: Secondary | ICD-10-CM | POA: Diagnosis not present

## 2016-05-19 DIAGNOSIS — Z9889 Other specified postprocedural states: Secondary | ICD-10-CM | POA: Insufficient documentation

## 2016-05-19 DIAGNOSIS — I481 Persistent atrial fibrillation: Secondary | ICD-10-CM

## 2016-05-19 DIAGNOSIS — I429 Cardiomyopathy, unspecified: Secondary | ICD-10-CM | POA: Diagnosis not present

## 2016-05-19 DIAGNOSIS — N184 Chronic kidney disease, stage 4 (severe): Secondary | ICD-10-CM | POA: Insufficient documentation

## 2016-05-19 DIAGNOSIS — Z87891 Personal history of nicotine dependence: Secondary | ICD-10-CM | POA: Insufficient documentation

## 2016-05-19 DIAGNOSIS — Z794 Long term (current) use of insulin: Secondary | ICD-10-CM

## 2016-05-19 DIAGNOSIS — I5022 Chronic systolic (congestive) heart failure: Secondary | ICD-10-CM | POA: Insufficient documentation

## 2016-05-19 DIAGNOSIS — I4892 Unspecified atrial flutter: Secondary | ICD-10-CM | POA: Diagnosis not present

## 2016-05-19 DIAGNOSIS — E1165 Type 2 diabetes mellitus with hyperglycemia: Secondary | ICD-10-CM | POA: Diagnosis not present

## 2016-05-19 DIAGNOSIS — I447 Left bundle-branch block, unspecified: Secondary | ICD-10-CM | POA: Diagnosis not present

## 2016-05-19 DIAGNOSIS — I130 Hypertensive heart and chronic kidney disease with heart failure and stage 1 through stage 4 chronic kidney disease, or unspecified chronic kidney disease: Secondary | ICD-10-CM | POA: Diagnosis not present

## 2016-05-19 DIAGNOSIS — I2510 Atherosclerotic heart disease of native coronary artery without angina pectoris: Secondary | ICD-10-CM | POA: Diagnosis not present

## 2016-05-19 DIAGNOSIS — Z95810 Presence of automatic (implantable) cardiac defibrillator: Secondary | ICD-10-CM | POA: Diagnosis not present

## 2016-05-19 DIAGNOSIS — Z5181 Encounter for therapeutic drug level monitoring: Secondary | ICD-10-CM

## 2016-05-19 DIAGNOSIS — I480 Paroxysmal atrial fibrillation: Secondary | ICD-10-CM

## 2016-05-19 DIAGNOSIS — I4819 Other persistent atrial fibrillation: Secondary | ICD-10-CM

## 2016-05-19 DIAGNOSIS — I483 Typical atrial flutter: Secondary | ICD-10-CM | POA: Diagnosis not present

## 2016-05-19 DIAGNOSIS — E1122 Type 2 diabetes mellitus with diabetic chronic kidney disease: Secondary | ICD-10-CM | POA: Diagnosis not present

## 2016-05-19 DIAGNOSIS — Z7901 Long term (current) use of anticoagulants: Secondary | ICD-10-CM | POA: Diagnosis not present

## 2016-05-19 LAB — BASIC METABOLIC PANEL
ANION GAP: 7 (ref 5–15)
BUN: 22 mg/dL — AB (ref 6–20)
CHLORIDE: 103 mmol/L (ref 101–111)
CO2: 31 mmol/L (ref 22–32)
Calcium: 9.1 mg/dL (ref 8.9–10.3)
Creatinine, Ser: 2.23 mg/dL — ABNORMAL HIGH (ref 0.61–1.24)
GFR, EST AFRICAN AMERICAN: 39 mL/min — AB (ref >60)
GFR, EST NON AFRICAN AMERICAN: 34 mL/min — AB (ref >60)
Glucose, Bld: 102 mg/dL — ABNORMAL HIGH (ref 65–99)
POTASSIUM: 3 mmol/L — AB (ref 3.5–5.1)
Sodium: 141 mmol/L (ref 135–145)

## 2016-05-19 LAB — POCT INR: INR: 1.6

## 2016-05-19 MED ORDER — CARVEDILOL 6.25 MG PO TABS: 9.3750 mg | ORAL_TABLET | Freq: Two times a day (BID) | ORAL | 3 refills | Status: DC

## 2016-05-19 MED ORDER — TORSEMIDE 20 MG PO TABS: 80.0000 mg | ORAL_TABLET | Freq: Every day | ORAL | 6 refills | Status: DC

## 2016-05-19 MED ORDER — AMLODIPINE BESYLATE 5 MG PO TABS: 5.0000 mg | ORAL_TABLET | Freq: Every day | ORAL | 6 refills | Status: DC

## 2016-05-19 MED FILL — AMLODIPINE BESYLATE 5 MG TA: 5 | 30 days supply | Qty: 30 | Fill #0

## 2016-05-19 MED FILL — CARVEDILOL 6.25 MG TABLET: 6.25 | 30 days supply | Qty: 90 | Fill #0

## 2016-05-19 NOTE — Progress Notes (Signed)
PCP: Dr. Martyn Malay HF Cardiology: Dr. Aundra Dubin EP: Dr. Lovena Le Nephrology: Dr Lorrene Reid   46 yo with history of poorly controlled DM, atrial flutter and fibrillation, nonischemic cardiomyopathy, and CKD presents for cardiology followup.  Cardiomyopathy has been known for a number of years. Cath in 6/06 showed no obstructive coronary disease.  He had ICD discharge for VT in 10/17 and again in 1/18, both times likely in the setting of CHF exacerbation.    He was admitted in 1/18 with exertional dyspnea/CHF exacerbation.  ICD had also discharged for VT.  He was noted to be in new atrial flutter.  He had a flutter ablation.  However, post-procedure, he developed AKI and cardiogenic shock.  Dobutamine and norepinephrine were required but eventually titrated off.  He developed atrial fibrillation subsequent to the atrial flutter ablation.  He had DCCV to NSR 04/11/16.  He is on amiodarone and ranolazine. He is in NSR today.   Today he returns for HF follow up. Last visit he increased torsemide for a few days, increased Coreg to 6.25 mg bid, and increase hydralazine to 100 mg tid.  Last week he saw Dr Lorrene Reid to establish care. Overall feeling ok. Denies SOB/PND/Orthopnea. Back at work full time. Weight at home 226 pounds.  No alcohol or drug use. Taking all medications. Followed by Paramedicine.  BP remains elevated.     Labs (2/18): hgb 14.6, K 4.9, creatinine 3.43 Labs 04/26/2016: K 4.1 Creatinine 2.79, TSH normal, LFTs normal, hgb 14.4  ECG (personally reviewed): NSR, 1st degree AVB, IVCD 162 msec  Corevue: impedance falling suggestive of fluid overload.   PMH: 1. Chronic systolic CHF: Nonischemic cardiomyopathy.  St Jude ICD.  - LHC (6/06): Nonobstructive CAD.  - Echo (1/18): EF 20%, moderate RV dilation with moderate-severely decreased RV systolic function, PASP 48 mmHg.  - Cardiolite (1/18): EF 7%, large fixed inferolateral defect no ischemia.  2. Atrial flutter: s/p ablation 1/18.  3. Atrial  fibrillation: Paroxysmal.  S/p DCCV 1/18.  4. CKD: Stage IV. 5. Type II diabetes: Poorly controlled over time.  6. VT: 10/17 and 1/18 with ICD discharge.    Social History   Social History  . Marital status: Widowed    Spouse name: N/A  . Number of children: N/A  . Years of education: N/A   Social History Main Topics  . Smoking status: Former Smoker    Years: 10.00    Types: Cigars    Quit date: 07/28/2002  . Smokeless tobacco: Never Used  . Alcohol use No     Comment: 03/28/2016 "stopped drinking 4-5 months ago"  . Drug use: No  . Sexual activity: Not Currently   Other Topics Concern  . None   Social History Narrative  . None   Family History  Problem Relation Age of Onset  . Hypertension Mother   . Heart disease Mother   . Diabetes Mother    ROS: All systems reviewed and negative except as per HPI.   Current Outpatient Prescriptions  Medication Sig Dispense Refill  . amiodarone (PACERONE) 200 MG tablet Take 200 mg by mouth daily.    . carvedilol (COREG) 6.25 MG tablet Take 1 tablet (6.25 mg total) by mouth 2 (two) times daily with a meal. 60 tablet 3  . hydrALAZINE (APRESOLINE) 100 MG tablet Take 1 tablet (100 mg total) by mouth 3 (three) times daily. 90 tablet 2  . insulin aspart (NOVOLOG) 100 UNIT/ML injection Inject 6 Units into the skin 3 (three) times daily  with meals. 10 mL 11  . insulin glargine (LANTUS) 100 UNIT/ML injection Inject 0.25 mLs (25 Units total) into the skin at bedtime. 10 mL 11  . isosorbide mononitrate (IMDUR) 30 MG 24 hr tablet Take 3 tablets (90 mg total) by mouth daily. 270 tablet 3  . magnesium oxide (MAG-OX) 400 (241.3 Mg) MG tablet Take 1 tablet (400 mg total) by mouth daily. 30 tablet 6  . ranolazine (RANEXA) 500 MG 12 hr tablet Take 1 tablet (500 mg total) by mouth 2 (two) times daily. 60 tablet 6  . torsemide (DEMADEX) 20 MG tablet Take 3 tablets (60 mg total) by mouth daily. 90 tablet 6  . warfarin (COUMADIN) 5 MG tablet Take 1 tablet  (5 mg total) by mouth daily. Take As Directed by Coumadin Clinic 30 tablet 1  . lidocaine (LIDODERM) 5 % Place 1 patch onto the skin daily. Remove & Discard patch within 12 hours or as directed by MD (Patient not taking: Reported on 05/10/2016) 30 patch 0   No current facility-administered medications for this encounter.    BP (!) 178/120 (BP Location: Left Arm, Patient Position: Sitting, Cuff Size: Normal)   Pulse 69   Wt 231 lb (104.8 kg)   SpO2 93%   BMI 33.15 kg/m  General: NAD Neck: JVP 10 cm, no thyromegaly or thyroid nodule.  Lungs: CTAB. CV: Nondisplaced PMI.  Heart regular S1/S2, no S3/S4, no murmur.  No peripheral edema.  No carotid bruit.  Normal pedal pulses.  Abdomen: Soft, nontender, no hepatosplenomegaly, no distention.  Skin: Intact without lesions or rashes.  Neurologic: Alert and oriented x 3.  Psych: Normal affect. Extremities: No clubbing or cyanosis.  HEENT: Normal.   Assessment/Plan: 1. Chronic systolic CHF: Nonischemic cardiomyopathy.  St Jude ICD.  Echo 1/18 with EF 20% and moderate to severely decreased RV systolic function.  NYHA class II symptoms.  Mild volume overload by exam and Corevue.  - Increase torsemide to 80 mg daily.   - Increase Coreg to 9.375 mg bid.  - Continue hydralazine 100 mg tid and continue Imdur 90 daily.  - No ACEI/ARB/ARNI or spironolactone with CKD.  - With LBBB-like IVCD, would favor CRT upgrade.  Dr. Lovena Le would like to see better glucose control prior to placing LV lead.  2. Atrial fibrillation: Paroxysmal.  He is in NSR today.   - Continue amiodarone.  LFTs and TSH normal recently.  He will need a regular eye exam.  - Continue warfarin.   3. HTN: BP elevated today, add amlodipine 5 mg daily and increase Coreg as above.   4. Diabetes: He now has a PCP.  Will check hgbA1c today.  Needs better glucose control prior to placing LV lead.  5. VT: Now on amiodarone.  6. CKD: Stage IV.  Seen by nephrology.    Loralie Champagne 05/21/2016

## 2016-05-19 NOTE — Patient Instructions (Addendum)
INCREASE Coreg to 9.375 mg ( one and one half tab) twice a day INCREASE Torsemide to 80 mg ( 4 tabs) daily START Amlodipine 5 mg one tab daily   BE SURE TO FOLLOW UP WITH YOUR PCP ABOUT YOUR BLOOD SUGARS  Your physician recommends that you schedule a follow-up appointment in: [place] AMY CLEGG, NP

## 2016-05-20 LAB — HEMOGLOBIN A1C
HEMOGLOBIN A1C: 9 % — AB (ref 4.8–5.6)
MEAN PLASMA GLUCOSE: 212 mg/dL

## 2016-05-20 MED FILL — TORSEMIDE 20 MG TABLET: 20 | 30 days supply | Qty: 120 | Fill #0

## 2016-05-24 ENCOUNTER — Telehealth (HOSPITAL_COMMUNITY): Payer: Self-pay | Admitting: Internal Medicine

## 2016-05-24 ENCOUNTER — Other Ambulatory Visit (HOSPITAL_COMMUNITY): Payer: Self-pay

## 2016-05-24 ENCOUNTER — Other Ambulatory Visit (HOSPITAL_COMMUNITY): Payer: Self-pay | Admitting: Pharmacist

## 2016-05-24 MED ORDER — POTASSIUM CHLORIDE CRYS ER 20 MEQ PO TBCR: 20.0000 meq | EXTENDED_RELEASE_TABLET | Freq: Every day | ORAL | 11 refills | Status: DC

## 2016-05-24 MED FILL — AMIODARONE HCL 200 MG TAB: 200 | 30 days supply | Qty: 30 | Fill #1

## 2016-05-24 MED FILL — hydrALAZINE HCL 100 MG TABS: 100 | 30 days supply | Qty: 90 | Fill #1

## 2016-05-24 NOTE — Progress Notes (Signed)
Paramedicine Encounter    Patient ID: Travis Bryan, male    DOB: 1970-12-13, 46 y.o.   MRN: [id number]   Patient Care Team: Zada Finders, MD as PCP - General (Internal Medicine)  Patient Active Problem List   Diagnosis Date Noted  . Persistent atrial fibrillation ([place])   . Chronic kidney disease, stage IV (severe) ([place])   . Typical atrial flutter ([place])   . CARDIOMYOPATHY, SECONDARY 07/03/2008  . SYSTOLIC HEART FAILURE, CHRONIC 07/03/2008  . Automatic implantable cardioverter-defibrillator in situ 07/03/2008  . Type 2 diabetes mellitus ([place]) 12/26/2005  . DYSLIPIDEMIA 12/26/2005  . OBESITY 12/26/2005  . Essential hypertension 12/26/2005    Current Outpatient Prescriptions:  .  amiodarone (PACERONE) 200 MG tablet, Take 200 mg by mouth daily., Disp: , Rfl:  .  amLODipine (NORVASC) 5 MG tablet, Take 1 tablet (5 mg total) by mouth daily., Disp: 30 tablet, Rfl: 6 .  carvedilol (COREG) 6.25 MG tablet, Take 1.5 tablets (9.375 mg total) by mouth 2 (two) times daily with a meal., Disp: 90 tablet, Rfl: 3 .  hydrALAZINE (APRESOLINE) 100 MG tablet, Take 1 tablet (100 mg total) by mouth 3 (three) times daily., Disp: 90 tablet, Rfl: 2 .  insulin aspart (NOVOLOG) 100 UNIT/ML injection, Inject 6 Units into the skin 3 (three) times daily with meals., Disp: 10 mL, Rfl: 11 .  insulin glargine (LANTUS) 100 UNIT/ML injection, Inject 0.25 mLs (25 Units total) into the skin at bedtime., Disp: 10 mL, Rfl: 11 .  isosorbide mononitrate (IMDUR) 30 MG 24 hr tablet, Take 3 tablets (90 mg total) by mouth daily., Disp: 270 tablet, Rfl: 3 .  magnesium oxide (MAG-OX) 400 (241.3 Mg) MG tablet, Take 1 tablet (400 mg total) by mouth daily., Disp: 30 tablet, Rfl: 6 .  ranolazine (RANEXA) 500 MG 12 hr tablet, Take 1 tablet (500 mg total) by mouth 2 (two) times daily., Disp: 60 tablet, Rfl: 6 .  torsemide (DEMADEX) 20 MG tablet, Take 4 tablets (80 mg total) by mouth daily., Disp: 120 tablet, Rfl: 6 .  warfarin (COUMADIN)  5 MG tablet, Take 1 tablet (5 mg total) by mouth daily. Take As Directed by [facility] Clinic, Disp: 30 tablet, Rfl: 1 .  lidocaine (LIDODERM) 5 %, Place 1 patch onto the skin daily. Remove & Discard patch within 12 hours or as directed by MD (Patient not taking: Reported on 05/10/2016), Disp: 30 patch, Rfl: 0 Allergies  Allergen Reactions  . No Known Allergies      Social History   Social History  . Marital status: Widowed    Spouse name: N/A  . Number of children: N/A  . Years of education: N/A   Occupational History  . Not on file.   Social History Main Topics  . Smoking status: Former Smoker    Years: 10.00    Types: Cigars    Quit date: 07/28/2002  . Smokeless tobacco: Never Used  . Alcohol use No     Comment: 03/28/2016 "stopped drinking 4-5 months ago"  . Drug use: No  . Sexual activity: Not Currently   Other Topics Concern  . Not on file   Social History Narrative  . No narrative on file    Physical Exam  Constitutional: He is oriented to person, place, and time.  Neck: Normal range of motion.  Cardiovascular: Normal rate.   Pulmonary/Chest: Effort normal and breath sounds normal. No respiratory distress. He has no wheezes. He has no rales.  Musculoskeletal: He exhibits no edema.  Neurological: He is alert and oriented to person, place, and time.  Skin: Skin is warm and dry.        SAFE - 04/26/16 1100      Situation   Admitting diagnosis chf   Heart failure history Exisiting   Comorbidities Atrial fibillation;CKD/renal insufficiency;DM;HTN;Other  pacemaker/defib   Readmitted within 30 days Yes   Hospital admission within past 12 months Yes     Assessment   Lives alone No   Primary support person aunt/uncle   Mode of transportation personal car   Other services involved None   Home equipement Scale     Weight   Weighs self daily No   Scale provided No  has own scales   Records on weight chart No     Resources   Has "Living better w/heart  failure" book Yes   Has HF Zone tool Yes   Able to identify yellow zone signs/when to call MD Yes   Records zone daily No     Medications   Uses a pill box Yes   Who stocks the pill box aunt/uncle   Pill box checked this visit No  did not bring to appointment   Difficulty obtaining medications No   Missed one or more doses of medications per week Yes   How many missed doses this week --  was not doing the increased dose of hydralazine     Nutrition   Patient receives meals on wheels No   Patient follows low sodium diet No   Has foods at home that meet the current recommended diet No   Patient follows low sugar/card diet No   Nutritional concerns/issues y     Activity Level   ADL's/Mobility Independent   How many feet can patient ambulate >74ft   Typical activity level active   Barriers none     Urine   Difficulty urinating Yes   Changes in urine None     Time spent with patient   Time spent with patient  45 Minutes        Future Appointments Date Time Provider [place]  05/26/2016 11:00 AM [facility]  06/21/2016 1:15 PM Zada Finders, MD [facility]  06/22/2016 10:30 AM [facility] PA/NP [facility] None  07/26/2016 11:20 AM [facility] DEVICE REMOTES [facility]   BP (!) 150/92   Pulse 72   Resp 16   SpO2 92%  Weight yesterday-226 Last visit weight-225 CBG-145  Mr. Leonetti is seen at home today and reports feeling well. He denies orthopnea, dizziness, headache or SOB. No doses of medication had been missed from the previous week. He reports eating chinese food last night which may be a contributing factor to his hypertension today. Medications were verified and pillbox refilled. Amiodarone was only able to be placed through Sunday. Amiodarone, hydralazine and torsemide were called in at the outpatient pharmacy and should be ready by tomorrow. Mr. Lavell says he will be able to pick the up.    Jacquiline Doe, EMT-Paramedic Marylouise Stacks, EMT-Paramedic 05/24/16  ACTION: Home visit completed Next visit planned for 1 week

## 2016-05-24 NOTE — Telephone Encounter (Signed)
Verified BCBS insurance benefits through [employer] No Co-Pay, Co-Insurance 20%, Deductible $600.00, pt has met all. Out of Pocket $3500.00, pt has met $1177.67, pt's responsibility [phone]. Reference [id number].... KJ  S/w pt concerning our Cardiac Rehab Program to see if he was ready to set up class time. He states he needs a little more time to call him back in 3 weeks and to mail out our program to pt. Mailed out program today... KJ

## 2016-05-26 ENCOUNTER — Ambulatory Visit (INDEPENDENT_AMBULATORY_CARE_PROVIDER_SITE_OTHER): Payer: BLUE CROSS/BLUE SHIELD | Admitting: *Deleted

## 2016-05-26 DIAGNOSIS — Z95810 Presence of automatic (implantable) cardiac defibrillator: Secondary | ICD-10-CM | POA: Diagnosis not present

## 2016-05-26 DIAGNOSIS — Z5181 Encounter for therapeutic drug level monitoring: Secondary | ICD-10-CM

## 2016-05-26 DIAGNOSIS — I481 Persistent atrial fibrillation: Secondary | ICD-10-CM

## 2016-05-26 DIAGNOSIS — I483 Typical atrial flutter: Secondary | ICD-10-CM | POA: Diagnosis not present

## 2016-05-26 DIAGNOSIS — I4819 Other persistent atrial fibrillation: Secondary | ICD-10-CM

## 2016-05-26 LAB — POCT INR: INR: 2.4

## 2016-05-30 ENCOUNTER — Other Ambulatory Visit: Payer: Self-pay | Admitting: Internal Medicine

## 2016-06-01 ENCOUNTER — Other Ambulatory Visit (HOSPITAL_COMMUNITY): Payer: Self-pay

## 2016-06-01 ENCOUNTER — Telehealth (HOSPITAL_COMMUNITY): Payer: Self-pay

## 2016-06-01 NOTE — Progress Notes (Signed)
Paramedicine Encounter    Patient ID: Travis Bryan, male    DOB: [birth year], 46 y.o.   MRN: [id number]   Patient Care Team: Zada Finders, MD as PCP - General (Internal Medicine)  Patient Active Problem List   Diagnosis Date Noted  . Persistent atrial fibrillation ([place])   . Chronic kidney disease, stage IV (severe) ([place])   . Typical atrial flutter ([place])   . CARDIOMYOPATHY, SECONDARY 07/03/2008  . SYSTOLIC HEART FAILURE, CHRONIC 07/03/2008  . Automatic implantable cardioverter-defibrillator in situ 07/03/2008  . Type 2 diabetes mellitus ([place]) 12/26/2005  . DYSLIPIDEMIA 12/26/2005  . OBESITY 12/26/2005  . Essential hypertension 12/26/2005    Current Outpatient Prescriptions:  .  amiodarone (PACERONE) 200 MG tablet, Take 200 mg by mouth daily., Disp: , Rfl:  .  amLODipine (NORVASC) 5 MG tablet, Take 1 tablet (5 mg total) by mouth daily., Disp: 30 tablet, Rfl: 6 .  carvedilol (COREG) 6.25 MG tablet, Take 1.5 tablets (9.375 mg total) by mouth 2 (two) times daily with a meal., Disp: 90 tablet, Rfl: 3 .  hydrALAZINE (APRESOLINE) 100 MG tablet, Take 1 tablet (100 mg total) by mouth 3 (three) times daily., Disp: 90 tablet, Rfl: 2 .  insulin aspart (NOVOLOG) 100 UNIT/ML injection, Inject 6 Units into the skin 3 (three) times daily with meals., Disp: 10 mL, Rfl: 11 .  insulin glargine (LANTUS) 100 UNIT/ML injection, Inject 0.25 mLs (25 Units total) into the skin at bedtime., Disp: 10 mL, Rfl: 11 .  isosorbide mononitrate (IMDUR) 30 MG 24 hr tablet, Take 3 tablets (90 mg total) by mouth daily., Disp: 270 tablet, Rfl: 3 .  magnesium oxide (MAG-OX) 400 (241.3 Mg) MG tablet, Take 1 tablet (400 mg total) by mouth daily., Disp: 30 tablet, Rfl: 6 .  potassium chloride SA (K-DUR,KLOR-CON) 20 MEQ tablet, Take 1 tablet (20 mEq total) by mouth daily., Disp: 30 tablet, Rfl: 11 .  ranolazine (RANEXA) 500 MG 12 hr tablet, Take 1 tablet (500 mg total) by mouth 2 (two) times daily., Disp: 60 tablet, Rfl: 6 .   torsemide (DEMADEX) 20 MG tablet, Take 4 tablets (80 mg total) by mouth daily., Disp: 120 tablet, Rfl: 6 .  warfarin (COUMADIN) 5 MG tablet, Take 1 tablet (5 mg total) by mouth daily. Take As Directed by Coumadin Clinic, Disp: 30 tablet, Rfl: 1 .  lidocaine (LIDODERM) 5 %, Place 1 patch onto the skin daily. Remove & Discard patch within 12 hours or as directed by MD (Patient not taking: Reported on 05/10/2016), Disp: 30 patch, Rfl: 0 Allergies  Allergen Reactions  . No Known Allergies      Social History   Social History  . Marital status: Widowed    Spouse name: N/A  . Number of children: N/A  . Years of education: N/A   Occupational History  . Not on file.   Social History Main Topics  . Smoking status: Former Smoker    Years: 10.00    Types: Cigars    Quit date: 07/28/2002  . Smokeless tobacco: Never Used  . Alcohol use No     Comment: 03/28/2016 "stopped drinking 4-5 months ago"  . Drug use: No  . Sexual activity: Not Currently   Other Topics Concern  . Not on file   Social History Narrative  . No narrative on file    Physical Exam      SAFE - 04/26/16 1100      Situation   Admitting diagnosis chf   Heart failure  history Exisiting   Comorbidities Atrial fibillation;CKD/renal insufficiency;DM;HTN;Other  pacemaker/defib   Readmitted within 30 days Yes   Hospital admission within past 12 months Yes     Assessment   Lives alone No   Primary support person aunt/uncle   Mode of transportation personal car   Other services involved None   Home equipement Scale     Weight   Weighs self daily No   Scale provided No  has own scales   Records on weight chart No     Resources   Has "Living better w/heart failure" book Yes   Has HF Zone tool Yes   Able to identify yellow zone signs/when to call MD Yes   Records zone daily No     Medications   Uses a pill box Yes   Who stocks the pill box aunt/uncle   Pill box checked this visit No  did not bring to  appointment   Difficulty obtaining medications No   Missed one or more doses of medications per week Yes   How many missed doses this week --  was not doing the increased dose of hydralazine     Nutrition   Patient receives meals on wheels No   Patient follows low sodium diet No   Has foods at home that meet the current recommended diet No   Patient follows low sugar/card diet No   Nutritional concerns/issues y     Activity Level   ADL's/Mobility Independent   How many feet can patient ambulate >19ft   Typical activity level active   Barriers none     Urine   Difficulty urinating Yes   Changes in urine None     Time spent with patient   Time spent with patient  45 Minutes          ReDS Vest - 06/01/16 1200      ReDS Vest   MR  No   Estimated volume prior to reading Med   Fitting Posture Standing   Height Marker Tall   Ruler Value [place]   ReDS Value 50      Future Appointments Date Time Provider [place]  06/02/2016 11:00 AM [facility]  06/15/2016 3:15 PM Evans Lance, MD [facility]  06/21/2016 1:15 PM Zada Finders, MD [facility]  06/22/2016 10:30 AM [facility] PA/NP [facility] None  07/26/2016 11:20 AM [facility] DEVICE REMOTES [facility]   BP (!) 144/102   Pulse 63   Resp 16   Wt 234 lb (106.1 kg)   SpO2 97%   BMI 33.58 kg/m  Weight yesterday-didn't weigh  Last visit weight-225 CBG PTA-90  Pt states he feels good, his weight is up 8lbs in 2 days--he states it isnt fluid though. Pt denies any c/p, no h/a, no dizziness.  Pt denies any sob, is able to lay flat in the bed.  Pt or his aunt does his pill box and during med check today everything is placed in there perfectly. Just advised him to be sure to space out the hydralazine every 8hrs and the the bedtime ones at least 12 hrs from the morning as he was taking them too close together.  Reds vest applied and  value was 50%. Left message on triage line regarding b/p, weight and reds value.   ACTION: Home visit completed Next visit planned for next week    Marylouise Stacks, EMT-Paramedic  06/01/16

## 2016-06-01 NOTE — Telephone Encounter (Signed)
Katie called CHF clinic to report patient had 8 lb weight gain with BLEE and BP 140s/100s Per Oda Kilts PA-C advised to increase torsemide to 80 mg in am and 40 mg in pm for 2-3 days and repeat bmet Monday.  Renee Pain, RN

## 2016-06-02 ENCOUNTER — Other Ambulatory Visit (HOSPITAL_COMMUNITY): Payer: Self-pay

## 2016-06-02 ENCOUNTER — Ambulatory Visit (INDEPENDENT_AMBULATORY_CARE_PROVIDER_SITE_OTHER): Payer: BLUE CROSS/BLUE SHIELD | Admitting: Pharmacist

## 2016-06-02 DIAGNOSIS — I483 Typical atrial flutter: Secondary | ICD-10-CM

## 2016-06-02 DIAGNOSIS — I481 Persistent atrial fibrillation: Secondary | ICD-10-CM

## 2016-06-02 DIAGNOSIS — Z95810 Presence of automatic (implantable) cardiac defibrillator: Secondary | ICD-10-CM

## 2016-06-02 DIAGNOSIS — I4819 Other persistent atrial fibrillation: Secondary | ICD-10-CM

## 2016-06-02 DIAGNOSIS — Z5181 Encounter for therapeutic drug level monitoring: Secondary | ICD-10-CM

## 2016-06-02 LAB — POCT INR: INR: 1.5

## 2016-06-02 NOTE — Progress Notes (Signed)
Paramedicine Encounter    Patient ID: Travis Bryan, male    DOB: 12/08/1970, 46 y.o.   MRN: [id number]   Patient Care Team: Zada Finders, MD as PCP - General (Internal Medicine)  Patient Active Problem List   Diagnosis Date Noted  . Persistent atrial fibrillation ([place])   . Chronic kidney disease, stage IV (severe) ([place])   . Typical atrial flutter ([place])   . CARDIOMYOPATHY, SECONDARY 07/03/2008  . SYSTOLIC HEART FAILURE, CHRONIC 07/03/2008  . Automatic implantable cardioverter-defibrillator in situ 07/03/2008  . Type 2 diabetes mellitus ([place]) 12/26/2005  . DYSLIPIDEMIA 12/26/2005  . OBESITY 12/26/2005  . Essential hypertension 12/26/2005    Current Outpatient Prescriptions:  .  amiodarone (PACERONE) 200 MG tablet, Take 200 mg by mouth daily., Disp: , Rfl:  .  amLODipine (NORVASC) 5 MG tablet, Take 1 tablet (5 mg total) by mouth daily., Disp: 30 tablet, Rfl: 6 .  carvedilol (COREG) 6.25 MG tablet, Take 1.5 tablets (9.375 mg total) by mouth 2 (two) times daily with a meal., Disp: 90 tablet, Rfl: 3 .  hydrALAZINE (APRESOLINE) 100 MG tablet, Take 1 tablet (100 mg total) by mouth 3 (three) times daily., Disp: 90 tablet, Rfl: 2 .  insulin aspart (NOVOLOG) 100 UNIT/ML injection, Inject 6 Units into the skin 3 (three) times daily with meals., Disp: 10 mL, Rfl: 11 .  insulin glargine (LANTUS) 100 UNIT/ML injection, Inject 0.25 mLs (25 Units total) into the skin at bedtime., Disp: 10 mL, Rfl: 11 .  isosorbide mononitrate (IMDUR) 30 MG 24 hr tablet, Take 3 tablets (90 mg total) by mouth daily., Disp: 270 tablet, Rfl: 3 .  lidocaine (LIDODERM) 5 %, Place 1 patch onto the skin daily. Remove & Discard patch within 12 hours or as directed by MD (Patient not taking: Reported on 05/10/2016), Disp: 30 patch, Rfl: 0 .  magnesium oxide (MAG-OX) 400 (241.3 Mg) MG tablet, Take 1 tablet (400 mg total) by mouth daily., Disp: 30 tablet, Rfl: 6 .  potassium chloride SA (K-DUR,KLOR-CON) 20 MEQ tablet, Take 1  tablet (20 mEq total) by mouth daily., Disp: 30 tablet, Rfl: 11 .  ranolazine (RANEXA) 500 MG 12 hr tablet, Take 1 tablet (500 mg total) by mouth 2 (two) times daily., Disp: 60 tablet, Rfl: 6 .  torsemide (DEMADEX) 20 MG tablet, Take 4 tablets (80 mg total) by mouth daily., Disp: 120 tablet, Rfl: 6 .  warfarin (COUMADIN) 5 MG tablet, Take 1 tablet (5 mg total) by mouth daily. Take As Directed by Coumadin Clinic, Disp: 30 tablet, Rfl: 1 Allergies  Allergen Reactions  . No Known Allergies      Social History   Social History  . Marital status: Widowed    Spouse name: N/A  . Number of children: N/A  . Years of education: N/A   Occupational History  . Not on file.   Social History Main Topics  . Smoking status: Former Smoker    Years: 10.00    Types: Cigars    Quit date: 07/28/2002  . Smokeless tobacco: Never Used  . Alcohol use No     Comment: 03/28/2016 "stopped drinking 4-5 months ago"  . Drug use: No  . Sexual activity: Not Currently   Other Topics Concern  . Not on file   Social History Narrative  . No narrative on file    Physical Exam  Constitutional: He is oriented to person, place, and time.  Neck: Normal range of motion.  Cardiovascular: Normal rate.   Pulmonary/Chest: Effort  normal and breath sounds normal. No respiratory distress. He has no wheezes. He has no rales.  Abdominal: Soft.  Musculoskeletal: Normal range of motion. He exhibits edema.  Neurological: He is alert and oriented to person, place, and time.        SAFE - 04/26/16 1100      Situation   Admitting diagnosis chf   Heart failure history Exisiting   Comorbidities Atrial fibillation;CKD/renal insufficiency;DM;HTN;Other  pacemaker/defib   Readmitted within 30 days Yes   Hospital admission within past 12 months Yes     Assessment   Lives alone No   Primary support person aunt/uncle   Mode of transportation personal car   Other services involved None   Home equipement Scale      Weight   Weighs self daily No   Scale provided No  has own scales   Records on weight chart No     Resources   Has "Living better w/heart failure" book Yes   Has HF Zone tool Yes   Able to identify yellow zone signs/when to call MD Yes   Records zone daily No     Medications   Uses a pill box Yes   Who stocks the pill box aunt/uncle   Pill box checked this visit No  did not bring to appointment   Difficulty obtaining medications No   Missed one or more doses of medications per week Yes   How many missed doses this week --  was not doing the increased dose of hydralazine     Nutrition   Patient receives meals on wheels No   Patient follows low sodium diet No   Has foods at home that meet the current recommended diet No   Patient follows low sugar/card diet No   Nutritional concerns/issues y     Activity Level   ADL's/Mobility Independent   How many feet can patient ambulate >59ft   Typical activity level active   Barriers none     Urine   Difficulty urinating Yes   Changes in urine None     Time spent with patient   Time spent with patient  45 Minutes          ReDS Vest - 06/01/16 1200      ReDS Vest   MR  No   Estimated volume prior to reading Med   Fitting Posture Standing   Height Marker Tall   Ruler Value [place]   ReDS Value 50      Future Appointments Date Time Provider [place]  06/09/2016 11:00 AM [facility]  06/15/2016 3:15 PM Evans Lance, MD [facility]  06/21/2016 1:15 PM Zada Finders, MD [facility]  06/22/2016 10:30 AM [facility] PA/NP [facility] None  07/26/2016 11:20 AM [facility] DEVICE REMOTES [facility]   BP (!) 130/98 (BP Location: Left Arm, Patient Position: Sitting, Cuff Size: Normal)   Pulse 68   Resp 16   SpO2 97%    Mr Antrim is seen at home today and reports feeling well. He denies SOB, orthopnea, dizziness and headache. Today's  visit was a follow up to inform him of a change in his Torsemide dosage. He was increased to 80 mg in the morning and 40 mg in the evening for the next three days and instructed to present to the clinic on Monday for lab work. An appointment was scheduled for 10:00 via [place] at the clinic. While scheduling he lab  appointment for Mr Mende, I asked if he should change the amount of potassium he was taking over the next three days. Jonni Sanger advise, via [place], that no change was necessary. Mr Wadhwa remained edematous in bilateral lower extremities and his abdomen appeared distended, though he denied the latter.   Jacquiline Doe, EMT 06/02/16  ACTION: Home visit completed

## 2016-06-05 ENCOUNTER — Other Ambulatory Visit: Payer: Self-pay | Admitting: Internal Medicine

## 2016-06-05 ENCOUNTER — Ambulatory Visit (HOSPITAL_COMMUNITY)
Admission: RE | Admit: 2016-06-05 | Discharge: 2016-06-05 | Disposition: A | Discharge status: Home / Self Care | Payer: BLUE CROSS/BLUE SHIELD | Source: Ambulatory Visit | Attending: Internal Medicine | Admitting: Internal Medicine

## 2016-06-05 DIAGNOSIS — I5022 Chronic systolic (congestive) heart failure: Secondary | ICD-10-CM | POA: Insufficient documentation

## 2016-06-05 LAB — BASIC METABOLIC PANEL
Anion gap: 9 (ref 5–15)
BUN: 28 mg/dL — ABNORMAL HIGH (ref 6–20)
CHLORIDE: 101 mmol/L (ref 101–111)
CO2: 30 mmol/L (ref 22–32)
CREATININE: 2.49 mg/dL — AB (ref 0.61–1.24)
Calcium: 9 mg/dL (ref 8.9–10.3)
GFR calc non Af Amer: 30 mL/min — ABNORMAL LOW (ref >60)
GFR, EST AFRICAN AMERICAN: 34 mL/min — AB (ref >60)
Glucose, Bld: 109 mg/dL — ABNORMAL HIGH (ref 65–99)
Potassium: 3.5 mmol/L (ref 3.5–5.1)
SODIUM: 140 mmol/L (ref 135–145)

## 2016-06-07 ENCOUNTER — Telehealth (HOSPITAL_COMMUNITY): Payer: Self-pay | Admitting: *Deleted

## 2016-06-07 ENCOUNTER — Other Ambulatory Visit (HOSPITAL_COMMUNITY): Payer: Self-pay

## 2016-06-07 DIAGNOSIS — I5022 Chronic systolic (congestive) heart failure: Secondary | ICD-10-CM

## 2016-06-07 MED ORDER — METOLAZONE 2.5 MG PO TABS: 2.5000 mg | ORAL_TABLET | ORAL | 0 refills | Status: DC | PRN

## 2016-06-07 MED ORDER — TORSEMIDE 20 MG PO TABS: ORAL_TABLET | ORAL | 3 refills | Status: DC

## 2016-06-07 MED ORDER — POTASSIUM CHLORIDE CRYS ER 20 MEQ PO TBCR: 40.0000 meq | EXTENDED_RELEASE_TABLET | Freq: Every day | ORAL | 3 refills | Status: DC

## 2016-06-07 MED FILL — WARFARIN SODIUM 5 MG TABLET: 5 | 30 days supply | Qty: 30 | Fill #1

## 2016-06-07 MED FILL — metOLazone 2.5 MG TABS: 2.5 | 5 days supply | Qty: 5 | Fill #0

## 2016-06-07 MED FILL — TORSEMIDE 20 MG TABLET: 20 | 30 days supply | Qty: 180 | Fill #0

## 2016-06-07 MED FILL — KLOR-CON M20 TABLET: 20 | 30 days supply | Qty: 60 | Fill #0

## 2016-06-07 NOTE — Telephone Encounter (Signed)
Katie with paramedic called while visiting patient to report that patient's weight is up an additional 2 lbs from last week and vest reading was 49%.  Patient's vest reading was 50% last week.  Patient is not complaining of shortness of breath but still has swelling in both legs.  Spoke with Darrick Grinder and she advised patient to increase Torsemide to 80 mg in the AM and 40 mg in the PM.  Increase Potassium to 40 mEq Daily and have him take 2.5 mg of Metolazone for two days only.  Also wants him in for lab work next week.  Medication's ordered and BMET ordered.  Appointment scheduled for next Monday.  Joellen Jersey is aware of changes and will instruct patient on the changes.

## 2016-06-07 NOTE — Progress Notes (Signed)
Paramedicine Encounter    Patient ID: Damarri Rampy, male    DOB: [birth year], 46 y.o.   MRN: [id number]   Patient Care Team: Zada Finders, MD as PCP - General (Internal Medicine)  Patient Active Problem List   Diagnosis Date Noted  . Persistent atrial fibrillation ([place])   . Chronic kidney disease, stage IV (severe) ([place])   . Typical atrial flutter ([place])   . CARDIOMYOPATHY, SECONDARY 07/03/2008  . SYSTOLIC HEART FAILURE, CHRONIC 07/03/2008  . Automatic implantable cardioverter-defibrillator in situ 07/03/2008  . Type 2 diabetes mellitus ([place]) 12/26/2005  . DYSLIPIDEMIA 12/26/2005  . OBESITY 12/26/2005  . Essential hypertension 12/26/2005    Current Outpatient Prescriptions:  .  amiodarone (PACERONE) 200 MG tablet, Take 200 mg by mouth daily., Disp: , Rfl:  .  amLODipine (NORVASC) 5 MG tablet, Take 1 tablet (5 mg total) by mouth daily., Disp: 30 tablet, Rfl: 6 .  carvedilol (COREG) 6.25 MG tablet, Take 1.5 tablets (9.375 mg total) by mouth 2 (two) times daily with a meal., Disp: 90 tablet, Rfl: 3 .  hydrALAZINE (APRESOLINE) 100 MG tablet, Take 1 tablet (100 mg total) by mouth 3 (three) times daily., Disp: 90 tablet, Rfl: 2 .  insulin aspart (NOVOLOG) 100 UNIT/ML injection, Inject 6 Units into the skin 3 (three) times daily with meals., Disp: 10 mL, Rfl: 11 .  insulin glargine (LANTUS) 100 UNIT/ML injection, Inject 0.25 mLs (25 Units total) into the skin at bedtime., Disp: 10 mL, Rfl: 11 .  isosorbide mononitrate (IMDUR) 30 MG 24 hr tablet, Take 3 tablets (90 mg total) by mouth daily., Disp: 270 tablet, Rfl: 3 .  magnesium oxide (MAG-OX) 400 (241.3 Mg) MG tablet, Take 1 tablet (400 mg total) by mouth daily., Disp: 30 tablet, Rfl: 6 .  potassium chloride SA (K-DUR,KLOR-CON) 20 MEQ tablet, Take 1 tablet (20 mEq total) by mouth daily., Disp: 30 tablet, Rfl: 11 .  ranolazine (RANEXA) 500 MG 12 hr tablet, Take 1 tablet (500 mg total) by mouth 2 (two) times daily., Disp: 60 tablet, Rfl: 6 .   torsemide (DEMADEX) 20 MG tablet, Take 4 tablets (80 mg total) by mouth daily., Disp: 120 tablet, Rfl: 6 .  warfarin (COUMADIN) 5 MG tablet, Take 1 tablet (5 mg total) by mouth daily. Take As Directed by Coumadin Clinic, Disp: 30 tablet, Rfl: 1 .  lidocaine (LIDODERM) 5 %, Place 1 patch onto the skin daily. Remove & Discard patch within 12 hours or as directed by MD (Patient not taking: Reported on 05/10/2016), Disp: 30 patch, Rfl: 0 Allergies  Allergen Reactions  . No Known Allergies      Social History   Social History  . Marital status: Widowed    Spouse name: N/A  . Number of children: N/A  . Years of education: N/A   Occupational History  . Not on file.   Social History Main Topics  . Smoking status: Former Smoker    Years: 10.00    Types: Cigars    Quit date: 07/28/2002  . Smokeless tobacco: Never Used  . Alcohol use No     Comment: 03/28/2016 "stopped drinking 4-5 months ago"  . Drug use: No  . Sexual activity: Not Currently   Other Topics Concern  . Not on file   Social History Narrative  . No narrative on file    Physical Exam  Constitutional: He is oriented to person, place, and time.  Pulmonary/Chest: Effort normal and breath sounds normal. No respiratory distress. He has no  wheezes. He has no rales.  Abdominal: He exhibits distension.  abd appears distended but pt denies   Musculoskeletal: Normal range of motion. He exhibits edema.  Legs are still swollen   Neurological: He is oriented to person, place, and time.  Skin: Skin is warm and dry.        SAFE - 04/26/16 1100      Situation   Admitting diagnosis chf   Heart failure history Exisiting   Comorbidities Atrial fibillation;CKD/renal insufficiency;DM;HTN;Other  pacemaker/defib   Readmitted within 30 days Yes   Hospital admission within past 12 months Yes     Assessment   Lives alone No   Primary support person aunt/uncle   Mode of transportation personal car   Other services involved None    Home equipement Scale     Weight   Weighs self daily No   Scale provided No  has own scales   Records on weight chart No     Resources   Has "Living better w/heart failure" book Yes   Has HF Zone tool Yes   Able to identify yellow zone signs/when to call MD Yes   Records zone daily No     Medications   Uses a pill box Yes   Who stocks the pill box aunt/uncle   Pill box checked this visit No  did not bring to appointment   Difficulty obtaining medications No   Missed one or more doses of medications per week Yes   How many missed doses this week --  was not doing the increased dose of hydralazine     Nutrition   Patient receives meals on wheels No   Patient follows low sodium diet No   Has foods at home that meet the current recommended diet No   Patient follows low sugar/card diet No   Nutritional concerns/issues y     Activity Level   ADL's/Mobility Independent   How many feet can patient ambulate >58ft   Typical activity level active   Barriers none     Urine   Difficulty urinating Yes   Changes in urine None     Time spent with patient   Time spent with patient  45 Minutes          ReDS Vest - 06/07/16 1300      ReDS Vest   MR  No   Estimated volume prior to reading Med   Fitting Posture Standing   Height Marker Tall   Ruler Value [place]   ReDS Value 49      Future Appointments Date Time Provider [place]  06/09/2016 11:00 AM [facility]  06/15/2016 3:15 PM Evans Lance, MD [facility]  06/21/2016 1:15 PM Zada Finders, MD [facility]  06/22/2016 10:30 AM [facility] PA/NP [facility] None  07/26/2016 11:20 AM [facility] DEVICE REMOTES [facility]   BP 108/70   Pulse 68   Resp 16   Wt 226 lb (102.5 kg)   SpO2 97%   BMI 32.43 kg/m  Weight yesterday-didn't weigh Last visit weight-224 CBG PTA-70 CBG EMS-242  Pt reports he followed through with the  orders last week of increasing his torsemide. He said he felt no different.  He hasnt been weighing daily. He states he is trying to get into it. He does his own pill box, has one day left before he is going to refill it.  CBG as noted-he did not take his insulin  with breakfast this morning since it was 70. Advised him to contact the doc that is prescribing the insulin to let them know its continuously being low in the mornings. Spoke with susie at clinic and she will talk to andy about pts vest value, bp and weight. He is up 2 lbs from last week.  Susie called me back and advised for pt to increase torsemide to 80mg  in am and 40mg  in pm, increase to 46meq of potassium, for 2 days take 2.5mg  of metolazone and come in for labs on Monday at 11-pt is aware and understands the change of meds and appointment. I will go p/u meds for him today and bring them to him tomorrow.   ACTION: Home visit completed Next visit planned for next week   Marylouise Stacks, EMT-Paramedic 06/07/16

## 2016-06-08 ENCOUNTER — Other Ambulatory Visit (HOSPITAL_COMMUNITY): Payer: Self-pay

## 2016-06-08 NOTE — Progress Notes (Signed)
Paramedicine Encounter   Patient ID: Travis Bryan , male,   DOB: January 28, 1971,45 y.o.,  MRN: [id number]   Picked up pts meds at pharmacy today and delivered them to his house so he could start the metolazone dose per clinic for 2 days.  Will f/u next week .  Marylouise Stacks, EMT-Paramedic 06/08/2016   ACTION: Home visit completed Next visit planned for next week

## 2016-06-09 ENCOUNTER — Ambulatory Visit (INDEPENDENT_AMBULATORY_CARE_PROVIDER_SITE_OTHER): Payer: BLUE CROSS/BLUE SHIELD | Admitting: *Deleted

## 2016-06-09 DIAGNOSIS — Z5181 Encounter for therapeutic drug level monitoring: Secondary | ICD-10-CM

## 2016-06-09 DIAGNOSIS — Z95810 Presence of automatic (implantable) cardiac defibrillator: Secondary | ICD-10-CM | POA: Diagnosis not present

## 2016-06-09 DIAGNOSIS — I481 Persistent atrial fibrillation: Secondary | ICD-10-CM

## 2016-06-09 DIAGNOSIS — I483 Typical atrial flutter: Secondary | ICD-10-CM

## 2016-06-09 DIAGNOSIS — I4819 Other persistent atrial fibrillation: Secondary | ICD-10-CM

## 2016-06-09 LAB — POCT INR: INR: 1.9

## 2016-06-13 ENCOUNTER — Ambulatory Visit (HOSPITAL_COMMUNITY)
Admission: RE | Admit: 2016-06-13 | Discharge: 2016-06-13 | Disposition: A | Discharge status: Home / Self Care | Payer: BLUE CROSS/BLUE SHIELD | Source: Ambulatory Visit | Attending: Internal Medicine | Admitting: Internal Medicine

## 2016-06-13 DIAGNOSIS — I5022 Chronic systolic (congestive) heart failure: Secondary | ICD-10-CM | POA: Diagnosis present

## 2016-06-13 LAB — BASIC METABOLIC PANEL
Anion gap: 11 (ref 5–15)
BUN: 54 mg/dL — AB (ref 6–20)
CHLORIDE: 95 mmol/L — AB (ref 101–111)
CO2: 32 mmol/L (ref 22–32)
CREATININE: 3.47 mg/dL — AB (ref 0.61–1.24)
Calcium: 9.5 mg/dL (ref 8.9–10.3)
GFR calc Af Amer: 23 mL/min — ABNORMAL LOW (ref >60)
GFR calc non Af Amer: 20 mL/min — ABNORMAL LOW (ref >60)
GLUCOSE: 111 mg/dL — AB (ref 65–99)
POTASSIUM: 3.2 mmol/L — AB (ref 3.5–5.1)
SODIUM: 138 mmol/L (ref 135–145)

## 2016-06-14 MED FILL — ISOSORBIDE MN ER 30 MG TAB: 30 | 30 days supply | Qty: 90 | Fill #2

## 2016-06-14 MED FILL — RANEXA ER 500 MG TABLET: 500 | 30 days supply | Qty: 60 | Fill #2

## 2016-06-14 MED FILL — AMLODIPINE BESYLATE 5 MG TA: 5 | 30 days supply | Qty: 30 | Fill #1

## 2016-06-14 MED FILL — LANTUS 100 UNITS/ML VIAL: 100 | 28 days supply | Qty: 10 | Fill #1

## 2016-06-15 ENCOUNTER — Other Ambulatory Visit (HOSPITAL_COMMUNITY): Payer: Self-pay

## 2016-06-15 ENCOUNTER — Ambulatory Visit (INDEPENDENT_AMBULATORY_CARE_PROVIDER_SITE_OTHER): Payer: BLUE CROSS/BLUE SHIELD | Admitting: Internal Medicine

## 2016-06-15 ENCOUNTER — Ambulatory Visit (INDEPENDENT_AMBULATORY_CARE_PROVIDER_SITE_OTHER): Payer: BLUE CROSS/BLUE SHIELD | Admitting: Pharmacist

## 2016-06-15 ENCOUNTER — Encounter: Payer: Self-pay | Admitting: Internal Medicine

## 2016-06-15 ENCOUNTER — Telehealth (HOSPITAL_COMMUNITY): Payer: Self-pay | Admitting: *Deleted

## 2016-06-15 ENCOUNTER — Telehealth (HOSPITAL_COMMUNITY): Payer: Self-pay | Admitting: Internal Medicine

## 2016-06-15 VITALS — BP 120/84 | HR 66 | Ht 70.0 in | Wt 227.0 lb

## 2016-06-15 DIAGNOSIS — I483 Typical atrial flutter: Secondary | ICD-10-CM

## 2016-06-15 DIAGNOSIS — I428 Other cardiomyopathies: Secondary | ICD-10-CM

## 2016-06-15 DIAGNOSIS — Z95810 Presence of automatic (implantable) cardiac defibrillator: Secondary | ICD-10-CM

## 2016-06-15 DIAGNOSIS — I472 Ventricular tachycardia, unspecified: Secondary | ICD-10-CM

## 2016-06-15 DIAGNOSIS — I4819 Other persistent atrial fibrillation: Secondary | ICD-10-CM

## 2016-06-15 DIAGNOSIS — I481 Persistent atrial fibrillation: Secondary | ICD-10-CM

## 2016-06-15 DIAGNOSIS — Z5181 Encounter for therapeutic drug level monitoring: Secondary | ICD-10-CM | POA: Diagnosis not present

## 2016-06-15 DIAGNOSIS — I5022 Chronic systolic (congestive) heart failure: Secondary | ICD-10-CM | POA: Diagnosis not present

## 2016-06-15 LAB — CUP PACEART INCLINIC DEVICE CHECK
Brady Statistic RV Percent Paced: 0.02 %
HighPow Impedance: 50.3036
Implantable Lead Implant Date: 20090211
Implantable Lead Model: 7121
Lead Channel Pacing Threshold Amplitude: 1 V
Lead Channel Setting Pacing Pulse Width: 0.5 ms
MDC IDC LEAD LOCATION: 753860
MDC IDC MSMT LEADCHNL RV IMPEDANCE VALUE: 337.5 Ohm
MDC IDC MSMT LEADCHNL RV PACING THRESHOLD PULSEWIDTH: 0.5 ms
MDC IDC MSMT LEADCHNL RV SENSING INTR AMPL: 11.8 mV
MDC IDC PG IMPLANT DT: 20150617
MDC IDC SESS DTM: 20180405180607
MDC IDC SET LEADCHNL RV PACING AMPLITUDE: 2.5 V
MDC IDC SET LEADCHNL RV SENSING SENSITIVITY: 0.5 mV
Pulse Gen Serial Number: 7200640

## 2016-06-15 LAB — POCT INR: INR: 1.8

## 2016-06-15 MED ORDER — TORSEMIDE 20 MG PO TABS: 60.0000 mg | ORAL_TABLET | Freq: Every day | ORAL | 3 refills | Status: DC

## 2016-06-15 NOTE — Patient Instructions (Addendum)
Medication Instructions:  Your physician recommends that you continue on your current medications as directed. Please refer to the Current Medication list given to you today.   Labwork: none  Testing/Procedures: none  Follow-Up: Remote monitoring is used to monitor your Pacemaker of ICD from home. This monitoring reduces the number of office visits required to check your device to one time per year. It allows [place] to keep an eye on the functioning of your device to ensure it is working properly. You are scheduled for a device check from home on 09/14/16. You may send your transmission at any time that day. If you have a wireless device, the transmission will be sent automatically. After your physician reviews your transmission, you will receive a postcard with your next transmission date.  Your physician wants you to follow-up in: 6 months.  You will receive a reminder letter in the mail two months in advance. If you don't receive a letter, please call our office to schedule the follow-up appointment.   Any Other Special Instructions Will Be Listed Below (If Applicable).     If you need a refill on your cardiac medications before your next appointment, please call your pharmacy.

## 2016-06-15 NOTE — Progress Notes (Signed)
HPI Travis Bryan returns today for followup. He is a very pleasant 46 year old man with chronic systolic heart failure, uncontrolled hypertension, diabetes, status post ICD implantation. In the interim, he has been in the hospital with decompensated CHF associated with atrial flutter, s/p ablation and atrial fib, s/p initiation of amiodarone therapy. He feels better. He is referred back today as he has been noted to have an increase in his QRS duration from 120 to 170 with an IVCD. The patient's CHF is improved under careful followup in our CHF clinic. Minimal edema. I note his HgbA1C has been elevated, most recently 9, and prior to that 13. Since DC from the hospital, he appears to have mostly class 2 symptoms. He is working the evening shift. Allergies  Allergen Reactions  . No Known Allergies      Current Outpatient Prescriptions  Medication Sig Dispense Refill  . amiodarone (PACERONE) 200 MG tablet Take 200 mg by mouth daily.    Marland Kitchen amLODipine (NORVASC) 5 MG tablet Take 1 tablet (5 mg total) by mouth daily. 30 tablet 6  . carvedilol (COREG) 6.25 MG tablet Take 1.5 tablets (9.375 mg total) by mouth 2 (two) times daily with a meal. 90 tablet 3  . hydrALAZINE (APRESOLINE) 100 MG tablet Take 1 tablet (100 mg total) by mouth 3 (three) times daily. 90 tablet 2  . insulin aspart (NOVOLOG) 100 UNIT/ML injection Inject 6 Units into the skin 3 (three) times daily with meals. 10 mL 11  . insulin glargine (LANTUS) 100 UNIT/ML injection Inject 0.25 mLs (25 Units total) into the skin at bedtime. 10 mL 11  . isosorbide mononitrate (IMDUR) 30 MG 24 hr tablet Take 3 tablets (90 mg total) by mouth daily. 270 tablet 3  . lidocaine (LIDODERM) 5 % Place 1 patch onto the skin daily. Remove & Discard patch within 12 hours or as directed by MD 30 patch 0  . magnesium oxide (MAG-OX) 400 (241.3 Mg) MG tablet Take 1 tablet (400 mg total) by mouth daily. 30 tablet 6  . potassium chloride SA (K-DUR,KLOR-CON) 20 MEQ tablet Take  2 tablets (40 mEq total) by mouth daily. 60 tablet 3  . ranolazine (RANEXA) 500 MG 12 hr tablet Take 1 tablet (500 mg total) by mouth 2 (two) times daily. 60 tablet 6  . torsemide (DEMADEX) 20 MG tablet Take 3 tablets (60 mg total) by mouth daily. Take 80 mg (4 Tabs) in the AM and 40 mg (2 Tabs) in the PM. 180 tablet 3  . warfarin (COUMADIN) 5 MG tablet Take 1 tablet (5 mg total) by mouth daily. Take As Directed by Coumadin Clinic 30 tablet 1  . metolazone (ZAROXOLYN) 2.5 MG tablet Take 1 tablet (2.5 mg total) by mouth as needed. Take as directed (Patient not taking: Reported on 06/15/2016) 5 tablet 0   No current facility-administered medications for this visit.      Past Medical History:  Diagnosis Date  . AICD (automatic cardioverter/defibrillator) present   . Atrial flutter ([place])    a. s/p ablation 03/2016  . Chronic systolic CHF (congestive heart failure) ([place])   . History of gout   . Hyperlipidemia   . Hypertension   . Myocardial infarction    "I think I had one a long long time ago" (03/28/2016)  . NICM (nonischemic cardiomyopathy) ([place])    a. LHC 6/06: pLAD 20, pLCx 20-30; b. Echo 5/15:  EF 15%, diffuse HK, restrictive physiology, trivial AI, trivial MR, mild LAE, moderate RVE, moderately reduced  RVSF, moderate RAE, mild to moderate TR, PASP 43 mmHg  . Obesity   . Persistent atrial fibrillation ([place])   . Type II diabetes mellitus ([facility])     ROS:   All systems reviewed and negative except as noted in the HPI.   Past Surgical History:  Procedure Laterality Date  . CARDIOVERSION N/A 04/07/2016   Procedure: CARDIOVERSION;  Surgeon: Thayer Headings, MD;  Location: [facility] ENDOSCOPY;  Service: Cardiovascular;  Laterality: N/A;  . CARDIOVERSION N/A 04/11/2016   Procedure: CARDIOVERSION;  Surgeon: Larey Dresser, MD;  Location: [place];  Service: Cardiovascular;  Laterality: N/A;  . ELECTROPHYSIOLOGIC STUDY N/A 03/31/2016   Procedure: A-Flutter Ablation;  Surgeon: Evans Lance, MD;   Location: [place] CV LAB;  Service: Cardiovascular;  Laterality: N/A;  . IMPLANTABLE CARDIOVERTER DEFIBRILLATOR (ICD) GENERATOR CHANGE N/A 08/27/2013   Procedure: ICD GENERATOR CHANGE;  Surgeon: Evans Lance, MD;  Location: [facility] CATH LAB;  Service: Cardiovascular;  Laterality: N/A;  . TEE WITHOUT CARDIOVERSION N/A 03/31/2016   Procedure: TRANSESOPHAGEAL ECHOCARDIOGRAM (TEE);  Surgeon: Larey Dresser, MD;  Location: [facility] ENDOSCOPY;  Service: Cardiovascular;  Laterality: N/A;     Family History  Problem Relation Age of Onset  . Hypertension Mother   . Heart disease Mother   . Diabetes Mother      Social History   Social History  . Marital status: Widowed    Spouse name: N/A  . Number of children: N/A  . Years of education: N/A   Occupational History  . Not on file.   Social History Main Topics  . Smoking status: Former Smoker    Years: 10.00    Types: Cigars    Quit date: 07/28/2002  . Smokeless tobacco: Never Used  . Alcohol use No     Comment: 03/28/2016 "stopped drinking 4-5 months ago"  . Drug use: No  . Sexual activity: Not Currently   Other Topics Concern  . Not on file   Social History Narrative  . No narrative on file     BP 120/84   Pulse 66   Ht 5\' 10"  (1.778 m)   Wt 227 lb (103 kg)   SpO2 93%   BMI 32.57 kg/m  Blood pressure was 150/105 by me Physical Exam:  Well appearing NAD HEENT: Unremarkable except for very poor dentition Neck:  7 cm JVD, no thyromegally Lungs:  Clear with no wheezes, rales, or rhonchi. HEART:  Regular rate rhythm, no murmurs, no rubs, no clicks, soft S4 gallop is present Abd:  soft, positive bowel sounds, no organomegally, no rebound, no guarding Ext:  2 plus pulses, no edema, no cyanosis, no clubbing Skin:  No rashes no nodules Neuro:  CN II through XII intact, motor grossly intact  DEVICE  Normal device function.  See PaceArt for details.   Assess/Plan:  1. VT - he is now on amio and has had no additional VT. Will  follow. 2. HTN heart disease - I have asked the patient to avoid salty food. He states that he will try and do better. 3. Chronic systolic heart failure - his symptoms are class 2A. I have asked him to eat less salt. He is being treated in our CHF clinic and is much improved.  4. ICD - his [employer] single chamber ICD is working normally. In the past 2 months since being on amiodarone, his QRS duration has increased. However, his CHF is compensated. For now, I would suggest he undergo watchful waiting.  His ECG shows and IVCD. His DM is not controlled as well as I would like.  Mikle Bosworth.D.

## 2016-06-15 NOTE — Progress Notes (Signed)
Paramedicine Encounter    Patient ID: Travis Bryan, male    DOB: [birth year], 46 y.o.   MRN: [id number]   Patient Care Team: Zada Finders, MD as PCP - General (Internal Medicine)  Patient Active Problem List   Diagnosis Date Noted  . Persistent atrial fibrillation ([place])   . Chronic kidney disease, stage IV (severe) ([place])   . Typical atrial flutter ([place])   . CARDIOMYOPATHY, SECONDARY 07/03/2008  . SYSTOLIC HEART FAILURE, CHRONIC 07/03/2008  . Automatic implantable cardioverter-defibrillator in situ 07/03/2008  . Type 2 diabetes mellitus ([place]) 12/26/2005  . DYSLIPIDEMIA 12/26/2005  . OBESITY 12/26/2005  . Essential hypertension 12/26/2005    Current Outpatient Prescriptions:  .  amiodarone (PACERONE) 200 MG tablet, Take 200 mg by mouth daily., Disp: , Rfl:  .  amLODipine (NORVASC) 5 MG tablet, Take 1 tablet (5 mg total) by mouth daily., Disp: 30 tablet, Rfl: 6 .  carvedilol (COREG) 6.25 MG tablet, Take 1.5 tablets (9.375 mg total) by mouth 2 (two) times daily with a meal., Disp: 90 tablet, Rfl: 3 .  hydrALAZINE (APRESOLINE) 100 MG tablet, Take 1 tablet (100 mg total) by mouth 3 (three) times daily., Disp: 90 tablet, Rfl: 2 .  insulin aspart (NOVOLOG) 100 UNIT/ML injection, Inject 6 Units into the skin 3 (three) times daily with meals., Disp: 10 mL, Rfl: 11 .  insulin glargine (LANTUS) 100 UNIT/ML injection, Inject 0.25 mLs (25 Units total) into the skin at bedtime., Disp: 10 mL, Rfl: 11 .  isosorbide mononitrate (IMDUR) 30 MG 24 hr tablet, Take 3 tablets (90 mg total) by mouth daily., Disp: 270 tablet, Rfl: 3 .  lidocaine (LIDODERM) 5 %, Place 1 patch onto the skin daily. Remove & Discard patch within 12 hours or as directed by MD (Patient not taking: Reported on 05/10/2016), Disp: 30 patch, Rfl: 0 .  magnesium oxide (MAG-OX) 400 (241.3 Mg) MG tablet, Take 1 tablet (400 mg total) by mouth daily., Disp: 30 tablet, Rfl: 6 .  metolazone (ZAROXOLYN) 2.5 MG tablet, Take 1 tablet (2.5 mg  total) by mouth as needed. Take as directed, Disp: 5 tablet, Rfl: 0 .  potassium chloride SA (K-DUR,KLOR-CON) 20 MEQ tablet, Take 2 tablets (40 mEq total) by mouth daily., Disp: 60 tablet, Rfl: 3 .  ranolazine (RANEXA) 500 MG 12 hr tablet, Take 1 tablet (500 mg total) by mouth 2 (two) times daily., Disp: 60 tablet, Rfl: 6 .  torsemide (DEMADEX) 20 MG tablet, Take 80 mg (4 Tabs) in the AM and 40 mg (2 Tabs) in the PM., Disp: 180 tablet, Rfl: 3 .  warfarin (COUMADIN) 5 MG tablet, Take 1 tablet (5 mg total) by mouth daily. Take As Directed by Coumadin Clinic, Disp: 30 tablet, Rfl: 1 Allergies  Allergen Reactions  . No Known Allergies      Social History   Social History  . Marital status: Widowed    Spouse name: N/A  . Number of children: N/A  . Years of education: N/A   Occupational History  . Not on file.   Social History Main Topics  . Smoking status: Former Smoker    Years: 10.00    Types: Cigars    Quit date: 07/28/2002  . Smokeless tobacco: Never Used  . Alcohol use No     Comment: 03/28/2016 "stopped drinking 4-5 months ago"  . Drug use: No  . Sexual activity: Not Currently   Other Topics Concern  . Not on file   Social History Narrative  . No  narrative on file    Physical Exam  Constitutional: He is oriented to person, place, and time.  Neck: Normal range of motion.  Pulmonary/Chest: Effort normal and breath sounds normal. No respiratory distress. He has no wheezes. He has no rales.  Abdominal: Soft.  Musculoskeletal: He exhibits edema.  Swelling to left leg, none to right   Neurological: He is oriented to person, place, and time.  Skin: Skin is warm and dry.  Psychiatric: He has a normal mood and affect.        SAFE - 04/26/16 1100      Situation   Admitting diagnosis chf   Heart failure history Exisiting   Comorbidities Atrial fibillation;CKD/renal insufficiency;DM;HTN;Other  pacemaker/defib   Readmitted within 30 days Yes   Hospital admission  within past 12 months Yes     Assessment   Lives alone No   Primary support person aunt/uncle   Mode of transportation personal car   Other services involved None   Home equipement Scale     Weight   Weighs self daily No   Scale provided No  has own scales   Records on weight chart No     Resources   Has "Living better w/heart failure" book Yes   Has HF Zone tool Yes   Able to identify yellow zone signs/when to call MD Yes   Records zone daily No     Medications   Uses a pill box Yes   Who stocks the pill box aunt/uncle   Pill box checked this visit No  did not bring to appointment   Difficulty obtaining medications No   Missed one or more doses of medications per week Yes   How many missed doses this week --  was not doing the increased dose of hydralazine     Nutrition   Patient receives meals on wheels No   Patient follows low sodium diet No   Has foods at home that meet the current recommended diet No   Patient follows low sugar/card diet No   Nutritional concerns/issues y     Activity Level   ADL's/Mobility Independent   How many feet can patient ambulate >36ft   Typical activity level active   Barriers none     Urine   Difficulty urinating Yes   Changes in urine None     Time spent with patient   Time spent with patient  45 Minutes        Future Appointments Date Time Provider [place]  06/15/2016 2:45 PM [facility]  06/15/2016 3:15 PM Evans Lance, MD [facility]  06/21/2016 1:15 PM Zada Finders, MD [facility]  06/22/2016 10:30 AM [facility] PA/NP [facility] None  07/26/2016 11:20 AM [facility] DEVICE REMOTES [facility]   BP (!) 140/100   Pulse 70   Resp 16   Wt 222 lb 4.8 oz (100.8 kg)   SpO2 98%   BMI 31.90 kg/m  Weight yesterday-223 Last visit weight-226 CBG PTA-85 CBG EMS-98  Pt states he has no complaints, clinic attempted to get in touch with him yesterday  about holding his torsemide but didn't get in touch with him-so he is going to hold dose today and go back to 60mg  daily. meds verified and pill box adjusted-he was missing a couple hydralazine in a couple slots and missing ranexa in one slot so that was fixed as well. No complaints. No dizziness. No h/a. He has swelling to the right and not  the left. He has appointment next week with clinic and goes to the coumadin clinic today and he will adjust accordingly.     ACTION: Home visit completed Next visit planned for next week at clinic. advised him to call if he got increased sob or increased swelling since his torsemide was reduced.   Marylouise Stacks, EMT-Paramedic 06/15/16

## 2016-06-15 NOTE — Telephone Encounter (Signed)
Notes recorded by Scarlette Calico, RN on 06/15/2016 at 12:39 PM EDT Katie w/paramedicine called about pt's lab results. She will advise pt of med changes, he has an appt 4/12 and will have repeat labs at that time ------  Notes recorded by Kerry Dory, CMA on 06/14/2016 at 4:25 PM EDT Left message for patient to call back. [phone]  ------  Notes recorded by Conrad , NP on 06/14/2016 at 2:05 PM EDT Please call. Hold torsemide for 24 hours. Cut back torsemide 60 mg daily. Repeat BMET in 10 days.

## 2016-06-15 NOTE — Telephone Encounter (Signed)
I left message on patient voicemail to call office and let us know if interested in the Cardiac Rehab program. If patient does not respond, patient has been contacted by phone 2X and letter has been sent. If no response from message left referral will be canceled.

## 2016-06-21 ENCOUNTER — Encounter: Payer: Self-pay | Admitting: Internal Medicine

## 2016-06-21 ENCOUNTER — Ambulatory Visit (INDEPENDENT_AMBULATORY_CARE_PROVIDER_SITE_OTHER): Payer: BLUE CROSS/BLUE SHIELD | Admitting: Internal Medicine

## 2016-06-21 VITALS — BP 137/83 | HR 65 | Temp 98.0°F | Ht 70.0 in | Wt 227.6 lb

## 2016-06-21 DIAGNOSIS — Z95810 Presence of automatic (implantable) cardiac defibrillator: Secondary | ICD-10-CM | POA: Diagnosis not present

## 2016-06-21 DIAGNOSIS — Z794 Long term (current) use of insulin: Secondary | ICD-10-CM | POA: Diagnosis not present

## 2016-06-21 DIAGNOSIS — E1122 Type 2 diabetes mellitus with diabetic chronic kidney disease: Secondary | ICD-10-CM | POA: Diagnosis not present

## 2016-06-21 DIAGNOSIS — Z0000 Encounter for general adult medical examination without abnormal findings: Secondary | ICD-10-CM

## 2016-06-21 DIAGNOSIS — N183 Chronic kidney disease, stage 3 (moderate): Secondary | ICD-10-CM | POA: Diagnosis not present

## 2016-06-21 DIAGNOSIS — I481 Persistent atrial fibrillation: Secondary | ICD-10-CM | POA: Diagnosis not present

## 2016-06-21 DIAGNOSIS — Z79899 Other long term (current) drug therapy: Secondary | ICD-10-CM | POA: Diagnosis not present

## 2016-06-21 DIAGNOSIS — Z87891 Personal history of nicotine dependence: Secondary | ICD-10-CM

## 2016-06-21 DIAGNOSIS — E1165 Type 2 diabetes mellitus with hyperglycemia: Secondary | ICD-10-CM

## 2016-06-21 DIAGNOSIS — I5022 Chronic systolic (congestive) heart failure: Secondary | ICD-10-CM

## 2016-06-21 DIAGNOSIS — I130 Hypertensive heart and chronic kidney disease with heart failure and stage 1 through stage 4 chronic kidney disease, or unspecified chronic kidney disease: Secondary | ICD-10-CM | POA: Diagnosis not present

## 2016-06-21 DIAGNOSIS — N184 Chronic kidney disease, stage 4 (severe): Secondary | ICD-10-CM

## 2016-06-21 DIAGNOSIS — Z7901 Long term (current) use of anticoagulants: Secondary | ICD-10-CM | POA: Diagnosis not present

## 2016-06-21 DIAGNOSIS — I10 Essential (primary) hypertension: Secondary | ICD-10-CM

## 2016-06-21 DIAGNOSIS — I4819 Other persistent atrial fibrillation: Secondary | ICD-10-CM

## 2016-06-21 NOTE — Patient Instructions (Addendum)
It was a pleasure to meet you Travis Bryan.  I think you are doing a good job working on your diabetes. Your blood sugar readings from your meter look good. We will recheck your Hemoglobin A1c in 2 months.  Please continue your Lantus 25 units at night and your Novolog 6 units with breakfast.  Please continue to think about trying Victoza (Liraglutide) for your diabetes. This would be once a day and you would not have to worry about taking insulin at work if we start this.   I have referred you to an eye doctor and to our diabetic counselor.  Think about the Pneumonia vaccine for next visit.  Liraglutide injection What is this medicine? LIRAGLUTIDE (LIR a GLOO tide) is used to improve blood sugar control in adults with type 2 diabetes. This medicine may be used with other diabetes medicines. This drug may also reduce the risk of heart attack or stroke if you have type 2 diabetes and risk factors for heart disease. This medicine may be used for other purposes; ask your health care provider or pharmacist if you have questions. COMMON BRAND NAME(S): Victoza What should I tell my health care provider before I take this medicine? They need to know if you have any of these conditions: -endocrine tumors (MEN 2) or if someone in your family had these tumors -gallbladder disease -high cholesterol -history of alcohol abuse problem -history of pancreatitis -kidney disease or if you are on dialysis -liver disease -previous swelling of the tongue, face, or lips with difficulty breathing, difficulty swallowing, hoarseness, or tightening of the throat -stomach problems -thyroid cancer or if someone in your family had thyroid cancer -an unusual or allergic reaction to liraglutide, other medicines, foods, dyes, or preservatives -pregnant or trying to get pregnant -breast-feeding How should I use this medicine? This medicine is for injection under the skin of your upper leg, stomach area, or upper arm.  You will be taught how to prepare and give this medicine. Use exactly as directed. Take your medicine at regular intervals. Do not take it more often than directed. It is important that you put your used needles and syringes in a special sharps container. Do not put them in a trash can. If you do not have a sharps container, call your pharmacist or healthcare provider to get one. A special MedGuide will be given to you by the pharmacist with each prescription and refill. Be sure to read this information carefully each time. Talk to your pediatrician regarding the use of this medicine in children. Special care may be needed. Overdosage: If you think you have taken too much of this medicine contact a poison control center or emergency room at once. NOTE: This medicine is only for you. Do not share this medicine with others. What if I miss a dose? If you miss a dose, take it as soon as you can. If it is almost time for your next dose, take only that dose. Do not take double or extra doses. What may interact with this medicine? -other medicines for diabetes Many medications may cause changes in blood sugar, these include: -alcohol containing beverages -antiviral medicines for HIV or AIDS -aspirin and aspirin-like drugs -certain medicines for blood pressure, heart disease, irregular heart beat -chromium -diuretics -male hormones, such as estrogens or progestins, birth control pills -fenofibrate -gemfibrozil -isoniazid -lanreotide -male hormones or anabolic steroids -MAOIs like Carbex, Eldepryl, Marplan, Nardil, and Parnate -medicines for weight loss -medicines for allergies, asthma, cold, or cough -medicines  for depression, anxiety, or psychotic disturbances -niacin -nicotine -NSAIDs, medicines for pain and inflammation, like ibuprofen or naproxen -octreotide -pasireotide -pentamidine -phenytoin -probenecid -quinolone antibiotics such as ciprofloxacin, levofloxacin, ofloxacin -some  herbal dietary supplements -steroid medicines such as prednisone or cortisone -sulfamethoxazole; trimethoprim -thyroid hormones Some medications can hide the warning symptoms of low blood sugar (hypoglycemia). You may need to monitor your blood sugar more closely if you are taking one of these medications. These include: -beta-blockers, often used for high blood pressure or heart problems (examples include atenolol, metoprolol, propranolol) -clonidine -guanethidine -reserpine This list may not describe all possible interactions. Give your health care provider a list of all the medicines, herbs, non-prescription drugs, or dietary supplements you use. Also tell them if you smoke, drink alcohol, or use illegal drugs. Some items may interact with your medicine. What should I watch for while using this medicine? Visit your doctor or health care professional for regular checks on your progress. Drink plenty of fluids while taking this medicine. Check with your doctor or health care professional if you get an attack of severe diarrhea, nausea, and vomiting. The loss of too much body fluid can make it dangerous for you to take this medicine. A test called the HbA1C (A1C) will be monitored. This is a simple blood test. It measures your blood sugar control over the last 2 to 3 months. You will receive this test every 3 to 6 months. Learn how to check your blood sugar. Learn the symptoms of low and high blood sugar and how to manage them. Always carry a quick-source of sugar with you in case you have symptoms of low blood sugar. Examples include hard sugar candy or glucose tablets. Make sure others know that you can choke if you eat or drink when you develop serious symptoms of low blood sugar, such as seizures or unconsciousness. They must get medical help at once. Tell your doctor or health care professional if you have high blood sugar. You might need to change the dose of your medicine. If you are sick or  exercising more than usual, you might need to change the dose of your medicine. Do not skip meals. Ask your doctor or health care professional if you should avoid alcohol. Many nonprescription cough and cold products contain sugar or alcohol. These can affect blood sugar. Pens should never be shared. Even if the needle is changed, sharing may result in passing of viruses like hepatitis or HIV. Wear a medical ID bracelet or chain, and carry a card that describes your disease and details of your medicine and dosage times. What side effects may I notice from receiving this medicine? Side effects that you should report to your doctor or health care professional as soon as possible: -allergic reactions like skin rash, itching or hives, swelling of the face, lips, or tongue -breathing problems -diarrhea that continues or is severe -lump or swelling on the neck -severe nausea -signs and symptoms of infection like fever or chills; cough; sore throat; pain or trouble passing urine -signs and symptoms of low blood sugar such as feeling anxious, confusion, dizziness, increased hunger, unusually weak or tired, sweating, shakiness, cold, irritable, headache, blurred vision, fast heartbeat, loss of consciousness -signs and symptoms of kidney injury like trouble passing urine or change in the amount of urine -trouble swallowing -unusual stomach upset or pain -vomiting Side effects that usually do not require medical attention (report to your doctor or health care professional if they continue or are bothersome): -constipation -  decreased appetite -diarrhea -fatigue -headache -nausea -pain, redness, or irritation at site where injected -stomach upset -stuffy or runny nose This list may not describe all possible side effects. Call your doctor for medical advice about side effects. You may report side effects to FDA at 1-800-FDA-1088. Where should I keep my medicine? Keep out of the reach of  children. Store unopened pen in a refrigerator between 2 and 8 degrees C (36 and 46 degrees F). Do not freeze or use if the medicine has been frozen. Protect from light and excessive heat. After you first use the pen, it can be stored at room temperature between 15 and 30 degrees C (59 and 86 degrees F) or in a refrigerator. Throw away your used pen after 30 days or after the expiration date, whichever comes first. Do not store your pen with the needle attached. If the needle is left on, medicine may leak from the pen. NOTE: This sheet is a summary. It may not cover all possible information. If you have questions about this medicine, talk to your doctor, pharmacist, or health care provider.  2018 Elsevier/Gold Standard (2016-03-16 14:39:40)

## 2016-06-21 NOTE — Progress Notes (Signed)
CC: T2DM  HPI:  [name] is a 46 y.o. male with PMH as listed below including Chronic systolic CHF, NICM, Afib/Aflutter s/p ablation, ICD placement, HTN, CKD, T2DM, and HLD who presents for follow up management of his T2DM. Please see problem list for status of patient's chronic medical issues.  T2DM: Patient is currently prescribed Lantus 25 units qhs and Novolog 6 units three times daily with meals. He says he is only taking the Novolog 6 units with breakfast and takes his Lantus with dinner. He says he checks his blood sugars at breakfast and dinner time. He does not check at lunch time because it is difficult while he is at work. He did bring his meter which I reviewed. His blood sugars have ranged from 68-212 over the last month with an average reading of 120. His pre-dinner CBGs average 138. He denies feeling any hyperglycemic or hypoglycemic symptoms (he is on a beta blocker). He tries to eat three consistent meals per day with small snacks in between.  Chronic Systolic CHF s/p ICD placement: Patient is following closely with the Heart Failure team (Dr. Aundra Bryan) and EP (Dr. Lovena Bryan). TTE on 04/02/2016 showed an EF of 20%, diffuse hypokinesis, grade 2 diastolic dysfunction. He is currently taking Torsemide 60 mg daily, Coreg 9.375 mg BID, Hydralazine 100 mg TID, Imdur 90 mg daily, and Ranexa 500 mg BID. He denies any chest pain, palpitations, shortness of breath, orthopnea, peripheral edema, or significant change in weight.  HTN: Patient currently takes Amlodipine 5 mg daily in addition to his CHF medications listed above. BP is 137/83 this visit.  Atrial Fibrillation: He is s/p DCCV on 04/11/16 with return to NSR. He currently takes Amiodarone for rhythm control and is on Coumadin for anticoagulation.  CKD 3-4: GFR has been ranging between 20-35 mL/min since the beginning of this year. He has been referred to Nephrology and is now following with Dr. Lorrene Bryan.  Healthcare  Maintenance: Patient is eligible for the PPSV23 vaccine. He has not had an eye exam.   Past Medical History:  Diagnosis Date  . AICD (automatic cardioverter/defibrillator) present   . Atrial flutter ([place])    a. s/p ablation 03/2016  . Chronic systolic CHF (congestive heart failure) ([place])   . History of gout   . Hyperlipidemia   . Hypertension   . Myocardial infarction    "I think I had one a long long time ago" (03/28/2016)  . NICM (nonischemic cardiomyopathy) ([place])    a. LHC 6/06: pLAD 20, pLCx 20-30; b. Echo 5/15:  EF 15%, diffuse HK, restrictive physiology, trivial AI, trivial MR, mild LAE, moderate RVE, moderately reduced RVSF, moderate RAE, mild to moderate TR, PASP 43 mmHg  . Obesity   . Persistent atrial fibrillation ([place])   . Type II diabetes mellitus ([place])     Review of Systems:   Review of Systems  Constitutional: Negative for chills, diaphoresis, fever and weight loss.  Respiratory: Negative for cough, sputum production, shortness of breath and wheezing.   Cardiovascular: Negative for chest pain, palpitations, orthopnea and leg swelling.  Musculoskeletal: Negative for falls.  Neurological: Negative for dizziness and loss of consciousness.     Physical Exam:  Vitals:   06/21/16 1327  BP: 137/83  Pulse: 65  Temp: 98 F (36.7 C)  TempSrc: Oral  SpO2: 100%  Weight: 227 lb 9.6 oz (103.2 kg)  Height: 5\' 10"  (1.778 m)   Physical Exam  Constitutional: He is oriented to person, place, and time.  He appears well-nourished. No distress.  HENT:  Poor dentition  Cardiovascular: Normal rate and regular rhythm.   Pulses:      Dorsalis pedis pulses are 1+ on the right side, and 1+ on the left side.       Posterior tibial pulses are 2+ on the right side, and 2+ on the left side.  Systolic murmur heard best at LUS border  Pulmonary/Chest: Effort normal. No respiratory distress. He has no wheezes. He has no rales.  Musculoskeletal: Normal range of motion. He exhibits no  edema or tenderness.  Neurological: He is alert and oriented to person, place, and time.  Skin: He is not diaphoretic.    Assessment & Plan:   See Encounters Tab for problem based charting.  Patient discussed with Dr. Dareen Bryan  Type 2 diabetes mellitus [facility]) Patient has not been taking his insulin as prescribed, however his blood sugars on review of his meter have been mostly well controlled. If anything, he has a few low blood sugars which I am concerned about. His pre-dinner insulin readings are well-controlled with an average of 138 even though he is not taking mealtime insulin with lunch.  I think he would benefit from a transition away from meal time Novolog insulin with the addition of an agent such as Liraglutide or Sitagliptin, however patient is reluctant to make a change at this time. He is not due for a repeat A1c for another 2 months, however I anticipate this would come down nicely from his A1c of 9.0 (05/19/16) if he continues his current medication regimen and eating pattern. I did provide him information on Liraglutide so he can think about starting this. I do think it would be easier for him to take the once daily Liraglutide so he does not have to worry about mealtime insulin while at work. For now, we will continue with the insulin as he has been taking. - Continue Lantus 25 units qhs - Continue Novolog 6 units with breakfast only - Consider change from mealtime insulin to Liraglutide or Sitagliptin if patient agreeable - Continue to check CBGs three times daily; bring meter on follow up - Repeat Hgb A1c in 2 months - Referred to Ophthalmology for retinopathy screen - Foot exam completed this visit  Chronic systolic heart failure ([place]) His CHF is currently stable. He reports adherence to his medications and continues to follow closely with Heart Failure. He will continue his current management and follow up with cardiology as scheduled.  Essential hypertension His blood  pressure is currently controlled. He will continue Amlodipine 5 mg daily in addition to his heart failure medications.  Persistent atrial fibrillation ([place]) Patient appears to be in sinus rhythm with rate controlled this visit. He will continue his Amiodarone for rhythm control and Coumadin for anticoagulation. - Continue current management and f/u with Cardiology as scheduled - Referred to Ophthalmology for diabetic retinopathy screen and current Amiodarone use  Chronic kidney disease, stage IV (severe) ([place]) Patient with history of CKD III with subsequent acute on chronic kidney injury during hospitalization earlier this year. He is now following with Nephrology, Dr. Lorrene Bryan. - Continue to follow with Nephrology  Healthcare maintenance Patient declined the pneumonia vaccine this visit. He will think about it. Referral sent for Ophthalmology for diabetic retinopathy screen and current Amiodarone use.

## 2016-06-22 ENCOUNTER — Encounter (HOSPITAL_COMMUNITY): Payer: Self-pay

## 2016-06-22 ENCOUNTER — Other Ambulatory Visit (HOSPITAL_COMMUNITY): Payer: Self-pay

## 2016-06-22 ENCOUNTER — Ambulatory Visit (HOSPITAL_COMMUNITY)
Admission: RE | Admit: 2016-06-22 | Discharge: 2016-06-22 | Disposition: A | Discharge status: Home / Self Care | Payer: BLUE CROSS/BLUE SHIELD | Source: Ambulatory Visit | Attending: Cardiology | Admitting: Cardiology

## 2016-06-22 VITALS — BP 126/80 | HR 65 | Wt 228.0 lb

## 2016-06-22 DIAGNOSIS — I428 Other cardiomyopathies: Secondary | ICD-10-CM | POA: Insufficient documentation

## 2016-06-22 DIAGNOSIS — I447 Left bundle-branch block, unspecified: Secondary | ICD-10-CM | POA: Insufficient documentation

## 2016-06-22 DIAGNOSIS — Z7901 Long term (current) use of anticoagulants: Secondary | ICD-10-CM | POA: Diagnosis not present

## 2016-06-22 DIAGNOSIS — I472 Ventricular tachycardia, unspecified: Secondary | ICD-10-CM

## 2016-06-22 DIAGNOSIS — I2510 Atherosclerotic heart disease of native coronary artery without angina pectoris: Secondary | ICD-10-CM | POA: Insufficient documentation

## 2016-06-22 DIAGNOSIS — I110 Hypertensive heart disease with heart failure: Secondary | ICD-10-CM | POA: Diagnosis not present

## 2016-06-22 DIAGNOSIS — E1165 Type 2 diabetes mellitus with hyperglycemia: Secondary | ICD-10-CM | POA: Diagnosis not present

## 2016-06-22 DIAGNOSIS — I5022 Chronic systolic (congestive) heart failure: Secondary | ICD-10-CM

## 2016-06-22 DIAGNOSIS — Z87891 Personal history of nicotine dependence: Secondary | ICD-10-CM | POA: Diagnosis not present

## 2016-06-22 DIAGNOSIS — Z794 Long term (current) use of insulin: Secondary | ICD-10-CM | POA: Diagnosis not present

## 2016-06-22 DIAGNOSIS — E1122 Type 2 diabetes mellitus with diabetic chronic kidney disease: Secondary | ICD-10-CM | POA: Diagnosis not present

## 2016-06-22 DIAGNOSIS — N184 Chronic kidney disease, stage 4 (severe): Secondary | ICD-10-CM

## 2016-06-22 DIAGNOSIS — I4892 Unspecified atrial flutter: Secondary | ICD-10-CM | POA: Diagnosis not present

## 2016-06-22 DIAGNOSIS — I130 Hypertensive heart and chronic kidney disease with heart failure and stage 1 through stage 4 chronic kidney disease, or unspecified chronic kidney disease: Secondary | ICD-10-CM | POA: Diagnosis not present

## 2016-06-22 DIAGNOSIS — Z9889 Other specified postprocedural states: Secondary | ICD-10-CM | POA: Insufficient documentation

## 2016-06-22 DIAGNOSIS — I480 Paroxysmal atrial fibrillation: Secondary | ICD-10-CM | POA: Diagnosis not present

## 2016-06-22 LAB — BASIC METABOLIC PANEL
ANION GAP: 9 (ref 5–15)
BUN: 34 mg/dL — ABNORMAL HIGH (ref 6–20)
CHLORIDE: 102 mmol/L (ref 101–111)
CO2: 27 mmol/L (ref 22–32)
Calcium: 9.1 mg/dL (ref 8.9–10.3)
Creatinine, Ser: 2.61 mg/dL — ABNORMAL HIGH (ref 0.61–1.24)
GFR calc Af Amer: 32 mL/min — ABNORMAL LOW (ref >60)
GFR calc non Af Amer: 28 mL/min — ABNORMAL LOW (ref >60)
GLUCOSE: 106 mg/dL — AB (ref 65–99)
POTASSIUM: 3.7 mmol/L (ref 3.5–5.1)
Sodium: 138 mmol/L (ref 135–145)

## 2016-06-22 MED FILL — CARVEDILOL 6.25 MG TABLET: 6.25 | 30 days supply | Qty: 90 | Fill #1

## 2016-06-22 NOTE — Progress Notes (Signed)
PCP: Dr. Martyn Malay HF Cardiology: Dr. Aundra Dubin EP: Dr. Lovena Le Nephrology: Dr Lorrene Reid   46 yo with history of poorly controlled DM, atrial flutter and fibrillation, nonischemic cardiomyopathy, and CKD presents for cardiology followup.  Cardiomyopathy has been known for a number of years. Cath in 6/06 showed no obstructive coronary disease.  He had ICD discharge for VT in 10/17 and again in 1/18, both times likely in the setting of CHF exacerbation.    He was admitted in 1/18 with exertional dyspnea/CHF exacerbation.  ICD had also discharged for VT.  He was noted to be in new atrial flutter.  He had a flutter ablation.  However, post-procedure, he developed AKI and cardiogenic shock.  Dobutamine and norepinephrine were required but eventually titrated off.  He developed atrial fibrillation subsequent to the atrial flutter ablation.  He had DCCV to NSR 04/11/16.  He is on amiodarone and ranolazine. He is in NSR today.   Today, he returns for HF follow up. Overall feeling well, breathing is ok. No SOB with walking into clinic, can walk around the grocery store without SOB. Works at a [employer] without SOB. Weights at home 223-228 pounds. Denies orthopnea and PND. Taking all medications with compliance, followed by paramedicine. Drinking less than 2L a day, eating a low salt diet. Does not exercise.     Labs (2/18): hgb 14.6, K 4.9, creatinine 3.43 Labs 04/26/2016: K 4.1 Creatinine 2.79, TSH normal, LFTs normal, hgb 14.4  Corevue:volume status ok, no VT/VF.   PMH: 1. Chronic systolic CHF: Nonischemic cardiomyopathy.  St Jude ICD.  - LHC (6/06): Nonobstructive CAD.  - Echo (1/18): EF 20%, moderate RV dilation with moderate-severely decreased RV systolic function, PASP 48 mmHg.  - Cardiolite (1/18): EF 7%, large fixed inferolateral defect no ischemia.  2. Atrial flutter: s/p ablation 1/18.  3. Atrial fibrillation: Paroxysmal.  S/p DCCV 1/18.  4. CKD: Stage IV. 5. Type II diabetes:  Poorly controlled over time.  6. VT: 10/17 and 1/18 with ICD discharge.    Social History   Social History  . Marital status: Widowed    Spouse name: N/A  . Number of children: N/A  . Years of education: N/A   Social History Main Topics  . Smoking status: Former Smoker    Years: 10.00    Types: Cigars    Quit date: 07/28/2002  . Smokeless tobacco: Never Used  . Alcohol use No     Comment: 03/28/2016 "stopped drinking 4-5 months ago"  . Drug use: No  . Sexual activity: Not Currently   Other Topics Concern  . None   Social History Narrative  . None   Family History  Problem Relation Age of Onset  . Hypertension Mother   . Heart disease Mother   . Diabetes Mother    ROS: All systems reviewed and negative except as per HPI.   Current Outpatient Prescriptions  Medication Sig Dispense Refill  . amiodarone (PACERONE) 200 MG tablet Take 200 mg by mouth daily.    Marland Kitchen amLODipine (NORVASC) 5 MG tablet Take 1 tablet (5 mg total) by mouth daily. 30 tablet 6  . carvedilol (COREG) 6.25 MG tablet Take 1.5 tablets (9.375 mg total) by mouth 2 (two) times daily with a meal. 90 tablet 3  . hydrALAZINE (APRESOLINE) 100 MG tablet Take 1 tablet (100 mg total) by mouth 3 (three) times daily. 90 tablet 2  . insulin aspart (NOVOLOG) 100 UNIT/ML injection Inject 6 Units into the skin  3 (three) times daily with meals. 10 mL 11  . insulin glargine (LANTUS) 100 UNIT/ML injection Inject 0.25 mLs (25 Units total) into the skin at bedtime. 10 mL 11  . isosorbide mononitrate (IMDUR) 30 MG 24 hr tablet Take 3 tablets (90 mg total) by mouth daily. 270 tablet 3  . lidocaine (LIDODERM) 5 % Place 1 patch onto the skin daily. Remove & Discard patch within 12 hours or as directed by MD 30 patch 0  . magnesium oxide (MAG-OX) 400 (241.3 Mg) MG tablet Take 1 tablet (400 mg total) by mouth daily. 30 tablet 6  . potassium chloride SA (K-DUR,KLOR-CON) 20 MEQ tablet Take 2 tablets (40 mEq total) by mouth daily. 60 tablet  3  . ranolazine (RANEXA) 500 MG 12 hr tablet Take 1 tablet (500 mg total) by mouth 2 (two) times daily. 60 tablet 6  . torsemide (DEMADEX) 20 MG tablet Take 60 mg by mouth daily.    Marland Kitchen warfarin (COUMADIN) 5 MG tablet Take 1 tablet (5 mg total) by mouth daily. Take As Directed by Coumadin Clinic 30 tablet 1  . metolazone (ZAROXOLYN) 2.5 MG tablet Take 1 tablet (2.5 mg total) by mouth as needed. Take as directed (Patient not taking: Reported on 06/15/2016) 5 tablet 0   No current facility-administered medications for this encounter.    BP 126/80   Pulse 65   Wt 228 lb (103.4 kg)   SpO2 99%   BMI 32.71 kg/m  General: NAD, walked into clinic without difficulty.  HEENT: Normal.  Neck: JVP 8-9 cm. no thyromegaly or thyroid nodule.  Lungs: Clear in upper lobes, diminished in bases.  CV: Nondisplaced PMI.  Heart rate regular, S1 and S2. No murmurs  No peripheral edema.  No carotid bruit.  Normal pedal pulses.  Abdomen: Soft, nontender, no hepatosplenomegaly, no distention.  Skin: Intact without lesions or rashes.  Neurologic: Alert and oriented x 3.  Psych: Normal affect Extremities: No clubbing or cyanosis.    Assessment/Plan: 1. Chronic systolic CHF: Nonischemic cardiomyopathy.  St Jude ICD.  Echo 1/18 with EF 20% and moderate to severely decreased RV systolic function.  NYHA class II symptoms.    - Volume status stable on exam, REDs vest 42 which is improved from prior reading which was 49.  - Continue torsemide 60mg  BID, recently decreased with worsening renal function. He is going to see Dr. Lorrene Reid tomorrow. Hesitant to increase diuretics with worsening renal function.  - Continue Coreg to 9.375 mg bid.  - Continue hydralazine 100 mg tid and continue Imdur 90 daily.  - No ACEI/ARB/ARNI or spironolactone with CKD.  - With LBBB-like IVCD, would favor CRT upgrade.  Dr. Lovena Le suggests watchful waiting for now.  2. Atrial fibrillation: Paroxysmal.   - NSR today.  - Continue amiodarone.   LFTs and TSH normal in 04/2016.   - Instructed him to get regular eye exams.  - Continue warfarin.   3. HTN:  - Much improved with the addition of amlodipine.  - well controlled today.  4. Diabetes:  - A1c is 9.0 - followed by Internal Medicine center 5. VT: No episodes noted on CorVue - Continue Amiodarone  6. CKD: Stage IV.   - BMET today. Followed by Renal, he has an appointment with Renal tomorrow  Follow up in 2 weeks. BMET, BNP today.   Arbutus Leas 06/22/2016

## 2016-06-22 NOTE — Progress Notes (Signed)
Paramedicine Encounter   Patient ID: Travis Bryan , male,   DOB: 17-Jan-1971,45 y.o.,  MRN: [id number]   Met patient in clinic today with provider.  Time spent with patient 79mn  Pt reports he feeling good, weight at home between 223-227lbs.  He reports his breathing is doing good. Able to work without difficulty. Pt hadnt checked CBG this morning and also hasnt eaten this morning either but did take his meds. No dizziness. No h/a.  Pt has all his meds. He fills his own pill box. REDS reading was 42%.   KMarylouise Stacks EMT-Paramedic 06/22/2016  ACTION: Home visit completed Next visit planned for next week

## 2016-06-22 NOTE — Progress Notes (Signed)
    ReDS Vest - 06/22/16 1100      ReDS Vest   MR  No   Fitting Posture Standing   Height Marker Tall   Ruler Value 8   Center Strip Shifted   ReDS Value 42

## 2016-06-22 NOTE — Patient Instructions (Signed)
Labs today  Your physician recommends that you schedule a follow-up appointment in: 2 weeks

## 2016-06-23 ENCOUNTER — Ambulatory Visit (INDEPENDENT_AMBULATORY_CARE_PROVIDER_SITE_OTHER): Payer: BLUE CROSS/BLUE SHIELD | Admitting: *Deleted

## 2016-06-23 ENCOUNTER — Telehealth: Payer: Self-pay | Admitting: *Deleted

## 2016-06-23 DIAGNOSIS — I483 Typical atrial flutter: Secondary | ICD-10-CM

## 2016-06-23 DIAGNOSIS — Z95810 Presence of automatic (implantable) cardiac defibrillator: Secondary | ICD-10-CM | POA: Diagnosis not present

## 2016-06-23 DIAGNOSIS — Z5181 Encounter for therapeutic drug level monitoring: Secondary | ICD-10-CM

## 2016-06-23 DIAGNOSIS — I481 Persistent atrial fibrillation: Secondary | ICD-10-CM | POA: Diagnosis not present

## 2016-06-23 DIAGNOSIS — I4819 Other persistent atrial fibrillation: Secondary | ICD-10-CM

## 2016-06-23 LAB — POCT INR: INR: 1.5

## 2016-06-23 NOTE — Telephone Encounter (Signed)
LVM FOR PATIENT REGARDING HIS EYE APPOINTMENT WITH [facility] MAY 8.018 ARRIVE 9:30 A.M.  / APPOINTMENT ALSO MAILED TO PATIENT.

## 2016-06-24 DIAGNOSIS — Z0000 Encounter for general adult medical examination without abnormal findings: Secondary | ICD-10-CM | POA: Insufficient documentation

## 2016-06-24 MED ORDER — INSULIN ASPART 100 UNIT/ML ~~LOC~~ SOLN: 6.0000 [IU] | Freq: Every day | SUBCUTANEOUS | 11 refills | Status: DC

## 2016-06-24 NOTE — Assessment & Plan Note (Signed)
Patient with history of CKD III with subsequent acute on chronic kidney injury during hospitalization earlier this year. He is now following with Nephrology, Dr. Lorrene Reid. - Continue to follow with Nephrology

## 2016-06-24 NOTE — Assessment & Plan Note (Signed)
His blood pressure is currently controlled. He will continue Amlodipine 5 mg daily in addition to his heart failure medications.

## 2016-06-24 NOTE — Assessment & Plan Note (Signed)
Patient has not been taking his insulin as prescribed, however his blood sugars on review of his meter have been mostly well controlled. If anything, he has a few low blood sugars which I am concerned about. His pre-dinner insulin readings are well-controlled with an average of 138 even though he is not taking mealtime insulin with lunch.  I think he would benefit from a transition away from meal time Novolog insulin with the addition of an agent such as Liraglutide or Sitagliptin, however patient is reluctant to make a change at this time. He is not due for a repeat A1c for another 2 months, however I anticipate this would come down nicely from his A1c of 9.0 (05/19/16) if he continues his current medication regimen and eating pattern. I did provide him information on Liraglutide so he can think about starting this. I do think it would be easier for him to take the once daily Liraglutide so he does not have to worry about mealtime insulin while at work. For now, we will continue with the insulin as he has been taking. - Continue Lantus 25 units qhs - Continue Novolog 6 units with breakfast only - Consider change from mealtime insulin to Liraglutide or Sitagliptin if patient agreeable - Continue to check CBGs three times daily; bring meter on follow up - Repeat Hgb A1c in 2 months - Referred to Ophthalmology for retinopathy screen - Foot exam completed this visit

## 2016-06-24 NOTE — Assessment & Plan Note (Signed)
His CHF is currently stable. He reports adherence to his medications and continues to follow closely with Heart Failure. He will continue his current management and follow up with cardiology as scheduled.

## 2016-06-24 NOTE — Assessment & Plan Note (Signed)
Patient declined the pneumonia vaccine this visit. He will think about it. Referral sent for Ophthalmology for diabetic retinopathy screen and current Amiodarone use.

## 2016-06-24 NOTE — Assessment & Plan Note (Signed)
Patient appears to be in sinus rhythm with rate controlled this visit. He will continue his Amiodarone for rhythm control and Coumadin for anticoagulation. - Continue current management and f/u with Cardiology as scheduled - Referred to Ophthalmology for diabetic retinopathy screen and current Amiodarone use

## 2016-06-27 NOTE — Progress Notes (Signed)
Internal Medicine Clinic Attending  Case discussed with Dr. Patel,Vishal at the time of the visit.  We reviewed the resident's history and exam and pertinent patient test results.  I agree with the assessment, diagnosis, and plan of care documented in the resident's note.  

## 2016-06-30 ENCOUNTER — Encounter (HOSPITAL_COMMUNITY): Payer: Self-pay | Admitting: Internal Medicine

## 2016-06-30 ENCOUNTER — Ambulatory Visit (INDEPENDENT_AMBULATORY_CARE_PROVIDER_SITE_OTHER): Payer: BLUE CROSS/BLUE SHIELD | Admitting: *Deleted

## 2016-06-30 ENCOUNTER — Other Ambulatory Visit (HOSPITAL_COMMUNITY): Payer: Self-pay

## 2016-06-30 DIAGNOSIS — I483 Typical atrial flutter: Secondary | ICD-10-CM

## 2016-06-30 DIAGNOSIS — Z95810 Presence of automatic (implantable) cardiac defibrillator: Secondary | ICD-10-CM

## 2016-06-30 DIAGNOSIS — Z5181 Encounter for therapeutic drug level monitoring: Secondary | ICD-10-CM | POA: Diagnosis not present

## 2016-06-30 DIAGNOSIS — I481 Persistent atrial fibrillation: Secondary | ICD-10-CM

## 2016-06-30 DIAGNOSIS — I4819 Other persistent atrial fibrillation: Secondary | ICD-10-CM

## 2016-06-30 LAB — POCT INR: INR: 2.2

## 2016-06-30 NOTE — Progress Notes (Signed)
Mailed letter w/CRP to pt... Travis Bryan

## 2016-06-30 NOTE — Progress Notes (Signed)
Paramedicine Encounter    Patient ID: Travis Bryan, male    DOB: 21-Nov-1970, 46 y.o.   MRN: [id number]   Patient Care Team: Zada Finders, MD as PCP - General (Internal Medicine)  Patient Active Problem List   Diagnosis Date Noted  . Healthcare maintenance 06/24/2016  . Persistent atrial fibrillation ([place])   . Chronic kidney disease, stage IV (severe) ([place])   . Typical atrial flutter ([place])   . CARDIOMYOPATHY, SECONDARY 07/03/2008  . Chronic systolic heart failure ([place]) 07/03/2008  . Automatic implantable cardioverter-defibrillator in situ 07/03/2008  . Type 2 diabetes mellitus ([place]) 12/26/2005  . DYSLIPIDEMIA 12/26/2005  . Essential hypertension 12/26/2005    Current Outpatient Prescriptions:  .  amiodarone (PACERONE) 200 MG tablet, Take 200 mg by mouth daily., Disp: , Rfl:  .  amLODipine (NORVASC) 5 MG tablet, Take 1 tablet (5 mg total) by mouth daily., Disp: 30 tablet, Rfl: 6 .  carvedilol (COREG) 6.25 MG tablet, Take 1.5 tablets (9.375 mg total) by mouth 2 (two) times daily with a meal., Disp: 90 tablet, Rfl: 3 .  hydrALAZINE (APRESOLINE) 100 MG tablet, Take 1 tablet (100 mg total) by mouth 3 (three) times daily., Disp: 90 tablet, Rfl: 2 .  insulin aspart (NOVOLOG) 100 UNIT/ML injection, Inject 6 Units into the skin daily with breakfast., Disp: 10 mL, Rfl: 11 .  insulin glargine (LANTUS) 100 UNIT/ML injection, Inject 0.25 mLs (25 Units total) into the skin at bedtime., Disp: 10 mL, Rfl: 11 .  isosorbide mononitrate (IMDUR) 30 MG 24 hr tablet, Take 3 tablets (90 mg total) by mouth daily., Disp: 270 tablet, Rfl: 3 .  lidocaine (LIDODERM) 5 %, Place 1 patch onto the skin daily. Remove & Discard patch within 12 hours or as directed by MD, Disp: 30 patch, Rfl: 0 .  magnesium oxide (MAG-OX) 400 (241.3 Mg) MG tablet, Take 1 tablet (400 mg total) by mouth daily., Disp: 30 tablet, Rfl: 6 .  potassium chloride SA (K-DUR,KLOR-CON) 20 MEQ tablet, Take 2 tablets (40 mEq total) by mouth daily.,  Disp: 60 tablet, Rfl: 3 .  ranolazine (RANEXA) 500 MG 12 hr tablet, Take 1 tablet (500 mg total) by mouth 2 (two) times daily., Disp: 60 tablet, Rfl: 6 .  torsemide (DEMADEX) 20 MG tablet, Take 60 mg by mouth daily., Disp: , Rfl:  .  warfarin (COUMADIN) 5 MG tablet, Take 1 tablet (5 mg total) by mouth daily. Take As Directed by Coumadin Clinic, Disp: 30 tablet, Rfl: 1 .  metolazone (ZAROXOLYN) 2.5 MG tablet, Take 1 tablet (2.5 mg total) by mouth as needed. Take as directed (Patient not taking: Reported on 06/15/2016), Disp: 5 tablet, Rfl: 0 Allergies  Allergen Reactions  . No Known Allergies      Social History   Social History  . Marital status: Widowed    Spouse name: N/A  . Number of children: N/A  . Years of education: N/A   Occupational History  . Not on file.   Social History Main Topics  . Smoking status: Former Smoker    Years: 10.00    Types: Cigars    Quit date: 07/28/2002  . Smokeless tobacco: Never Used  . Alcohol use No     Comment: 03/28/2016 "stopped drinking 4-5 months ago"  . Drug use: No  . Sexual activity: Not Currently   Other Topics Concern  . Not on file   Social History Narrative  . No narrative on file    Physical Exam  Constitutional: He is  oriented to person, place, and time. He appears well-developed.  Eyes: Pupils are equal, round, and reactive to light.  Neck: Normal range of motion. No JVD present.  Cardiovascular: Normal rate and regular rhythm.   Pulmonary/Chest: No respiratory distress. He has no wheezes. He has no rales.  Abdominal: He exhibits no distension. There is no tenderness.  Musculoskeletal: Normal range of motion. He exhibits edema.  Neurological: He is alert and oriented to person, place, and time.  Skin: Skin is warm and dry.        Future Appointments Date Time Provider [place]  07/06/2016 9:30 AM [facility] PA/NP [facility] None  07/06/2016 10:35 AM [facility]  07/18/2016  9:15 AM Chauncey Reading Plyler, RD [facility]  09/06/2016 1:15 PM Zada Finders, MD [facility]  09/14/2016 3:35 PM [facility] DEVICE REMOTES [facility]   BP (!) 158/100 (BP Location: Left Arm, Patient Position: Sitting, Cuff Size: Normal)   Pulse 68   Resp 16   Wt 229 lb 14.4 oz (104.3 kg)   SpO2 96%   BMI 32.99 kg/m  Weight yesterday- Did not weigh Last visit weight- 228 lbs  Travis Bryan was seen at home today and reports feeling in good health and had just come home from a visit at the coumadin clinic. He denies SOB, H/A, dizziness or orthopnea. Minimal edema was noted in his lower extremities, specifically where the top elastic of his socks were. He had missed his afternoon dose of hydralazine all week but it appeared he has been taking his other medications as prescribed. He did not give an explanation for the missed doses. He did not weigh yesterday and could not tell me what his weights have been running this week but did weigh at my request today. Medications were verified and his pillbox was refilled. He was hypertensive as noted in the V/S but had not taken his morning medications until after I arrived. I requested that, on his way to work, he stop at a drug store and use the automated cuff and let me know the reading it gives to see if there is an improvement.    Jacquiline Doe, EMT-Paramedic 06/30/16  ACTION: Home visit completed Next visit planned for 1 week

## 2016-07-06 ENCOUNTER — Other Ambulatory Visit: Payer: Self-pay | Admitting: Internal Medicine

## 2016-07-06 ENCOUNTER — Other Ambulatory Visit (HOSPITAL_COMMUNITY): Payer: Self-pay

## 2016-07-06 ENCOUNTER — Ambulatory Visit (HOSPITAL_COMMUNITY)
Admission: RE | Admit: 2016-07-06 | Discharge: 2016-07-06 | Disposition: A | Discharge status: Home / Self Care | Payer: BLUE CROSS/BLUE SHIELD | Source: Ambulatory Visit | Attending: Cardiology | Admitting: Cardiology

## 2016-07-06 ENCOUNTER — Encounter (HOSPITAL_COMMUNITY): Payer: Self-pay

## 2016-07-06 VITALS — BP 158/112 | HR 77 | Wt 232.6 lb

## 2016-07-06 DIAGNOSIS — I4892 Unspecified atrial flutter: Secondary | ICD-10-CM | POA: Insufficient documentation

## 2016-07-06 DIAGNOSIS — E1165 Type 2 diabetes mellitus with hyperglycemia: Secondary | ICD-10-CM | POA: Diagnosis not present

## 2016-07-06 DIAGNOSIS — I130 Hypertensive heart and chronic kidney disease with heart failure and stage 1 through stage 4 chronic kidney disease, or unspecified chronic kidney disease: Secondary | ICD-10-CM | POA: Insufficient documentation

## 2016-07-06 DIAGNOSIS — Z95810 Presence of automatic (implantable) cardiac defibrillator: Secondary | ICD-10-CM | POA: Insufficient documentation

## 2016-07-06 DIAGNOSIS — I110 Hypertensive heart disease with heart failure: Secondary | ICD-10-CM

## 2016-07-06 DIAGNOSIS — I10 Essential (primary) hypertension: Secondary | ICD-10-CM | POA: Diagnosis not present

## 2016-07-06 DIAGNOSIS — Z794 Long term (current) use of insulin: Secondary | ICD-10-CM

## 2016-07-06 DIAGNOSIS — N184 Chronic kidney disease, stage 4 (severe): Secondary | ICD-10-CM

## 2016-07-06 DIAGNOSIS — Z87891 Personal history of nicotine dependence: Secondary | ICD-10-CM | POA: Diagnosis not present

## 2016-07-06 DIAGNOSIS — Z7901 Long term (current) use of anticoagulants: Secondary | ICD-10-CM | POA: Insufficient documentation

## 2016-07-06 DIAGNOSIS — I5022 Chronic systolic (congestive) heart failure: Secondary | ICD-10-CM

## 2016-07-06 DIAGNOSIS — I447 Left bundle-branch block, unspecified: Secondary | ICD-10-CM | POA: Diagnosis not present

## 2016-07-06 DIAGNOSIS — E1122 Type 2 diabetes mellitus with diabetic chronic kidney disease: Secondary | ICD-10-CM | POA: Diagnosis not present

## 2016-07-06 DIAGNOSIS — I480 Paroxysmal atrial fibrillation: Secondary | ICD-10-CM | POA: Diagnosis not present

## 2016-07-06 DIAGNOSIS — I2510 Atherosclerotic heart disease of native coronary artery without angina pectoris: Secondary | ICD-10-CM | POA: Diagnosis not present

## 2016-07-06 DIAGNOSIS — Z79899 Other long term (current) drug therapy: Secondary | ICD-10-CM | POA: Diagnosis not present

## 2016-07-06 DIAGNOSIS — I429 Cardiomyopathy, unspecified: Secondary | ICD-10-CM | POA: Diagnosis not present

## 2016-07-06 NOTE — Progress Notes (Signed)
Paramedicine Encounter   Patient ID: Travis Bryan , male,   DOB: Aug 05, 1970,45 y.o.,  MRN: [id number]   Met patient in clinic today with provider.  Time spent with patient 30 min   Pt states he has not taken his meds this morning as he just woke up, he reports he is not weighing everyday.-last time he weighed was last week when zack was there was 229-today in clinic was 232. On 4/5 his weight was 222.  He denies any increased sob. Not taking the hydralazine 3x a day. He eats the "[employer]"- he reports that it comes from [employer] and its frozen meals-asked him next time we are there to let us look at it to read the sodium content. No changes were made today. He did not check his CBG this morning either and did not eat breakfast.   Marylouise Stacks, EMT-Paramedic 07/06/2016   ACTION: Home visit completed Next visit planned for next week

## 2016-07-06 NOTE — Progress Notes (Signed)
PCP: Dr. Martyn Malay HF Cardiology: Dr. Aundra Dubin EP: Dr. Lovena Le Nephrology: Dr Lorrene Reid   46 yo with history of poorly controlled DM, atrial flutter and fibrillation, nonischemic cardiomyopathy, and CKD presents for cardiology followup.  Cardiomyopathy has been known for a number of years. Cath in 6/06 showed no obstructive coronary disease.  He had ICD discharge for VT in 10/17 and again in 1/18, both times likely in the setting of CHF exacerbation.    He was admitted in 1/18 with exertional dyspnea/CHF exacerbation.  ICD had also discharged for VT.  He was noted to be in new atrial flutter.  He had a flutter ablation.  However, post-procedure, he developed AKI and cardiogenic shock.  Dobutamine and norepinephrine were required but eventually titrated off.  He developed atrial fibrillation subsequent to the atrial flutter ablation.  He had DCCV to NSR 04/11/16.  He is on amiodarone and ranolazine. He is in NSR today.   He returns today for HF follow up. Overall feeling ok, continues to have poor insight regarding his health. He has not taken his morning medications, BP is 158/100 and says he "doesn't feel any different". Weight up 4 pounds from last visit. Not weighing at home. Taking medications with intermittent compliance. Denies chest pain, denies SOB. 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 No SOB with stairs. Drinking less than 2L a day, eating the food provided by [facility]. Has a large amount of sodium content - frozen meals. Corevue not done this visit, as it was out of service.     Labs (2/18): hgb 14.6, K 4.9, creatinine 3.43 Labs 04/26/2016: K 4.1 Creatinine 2.79, TSH normal, LFTs normal, hgb 14.4   PMH: 1. Chronic systolic CHF: Nonischemic cardiomyopathy.  St Jude ICD.  - LHC (6/06): Nonobstructive CAD.  - Echo (1/18): EF 20%, moderate RV dilation with moderate-severely decreased RV systolic function, PASP 48 mmHg.  - Cardiolite  (1/18): EF 7%, large fixed inferolateral defect no ischemia.  2. Atrial flutter: s/p ablation 1/18.  3. Atrial fibrillation: Paroxysmal.  S/p DCCV 1/18.  4. CKD: Stage IV. 5. Type II diabetes: Poorly controlled over time.  6. VT: 10/17 and 1/18 with ICD discharge.    Social History   Social History  . Marital status: Widowed    Spouse name: N/A  . Number of children: N/A  . Years of education: N/A   Social History Main Topics  . Smoking status: Former Smoker    Years: 10.00    Types: Cigars    Quit date: 07/28/2002  . Smokeless tobacco: Never Used  . Alcohol use No     Comment: 03/28/2016 "stopped drinking 4-5 months ago"  . Drug use: No  . Sexual activity: Not Currently   Other Topics Concern  . None   Social History Narrative  . None   Family History  Problem Relation Age of Onset  . Hypertension Mother   . Heart disease Mother   . Diabetes Mother    ROS: All systems reviewed and negative except as per HPI.   Current Outpatient Prescriptions  Medication Sig Dispense Refill  . amiodarone (PACERONE) 200 MG tablet Take 200 mg by mouth daily.    Marland Kitchen amLODipine (NORVASC) 5 MG tablet Take 1 tablet (5 mg total) by mouth daily. 30 tablet 6  . carvedilol (COREG) 6.25 MG tablet Take 1.5 tablets (9.375 mg total) by mouth 2 (two) times daily with a meal.  90 tablet 3  . hydrALAZINE (APRESOLINE) 100 MG tablet Take 1 tablet (100 mg total) by mouth 3 (three) times daily. 90 tablet 2  . insulin aspart (NOVOLOG) 100 UNIT/ML injection Inject 6 Units into the skin daily with breakfast. 10 mL 11  . insulin glargine (LANTUS) 100 UNIT/ML injection Inject 0.25 mLs (25 Units total) into the skin at bedtime. 10 mL 11  . isosorbide mononitrate (IMDUR) 30 MG 24 hr tablet Take 3 tablets (90 mg total) by mouth daily. 270 tablet 3  . lidocaine (LIDODERM) 5 % Place 1 patch onto the skin daily. Remove & Discard patch within 12 hours or as directed by MD 30 patch 0  . magnesium oxide (MAG-OX) 400  (241.3 Mg) MG tablet Take 1 tablet (400 mg total) by mouth daily. 30 tablet 6  . metolazone (ZAROXOLYN) 2.5 MG tablet Take 1 tablet (2.5 mg total) by mouth as needed. Take as directed 5 tablet 0  . potassium chloride SA (K-DUR,KLOR-CON) 20 MEQ tablet Take 2 tablets (40 mEq total) by mouth daily. 60 tablet 3  . ranolazine (RANEXA) 500 MG 12 hr tablet Take 1 tablet (500 mg total) by mouth 2 (two) times daily. 60 tablet 6  . torsemide (DEMADEX) 20 MG tablet Take 60 mg by mouth daily.    Marland Kitchen warfarin (COUMADIN) 5 MG tablet Take 1 tablet (5 mg total) by mouth daily. Take As Directed by Coumadin Clinic 30 tablet 1   No current facility-administered medications for this encounter.    BP (!) 158/112 (BP Location: Left Arm, Patient Position: Sitting, Cuff Size: Normal)   Pulse 77   Wt 232 lb 9.6 oz (105.5 kg)   SpO2 96%   BMI 33.37 kg/m  General: Male, NAD. Walked into clinic without difficulty.  HEENT: normal, atraumatic.  Neck: Supple, JVP 6-7cm. No thyromegaly or thyroid nodule.  Lungs: Clear to auscultation bilaterally. Normal effort.   CV: PMI nondisplaced. Regular rate and rhythm. No murmurs  No peripheral edema.  No carotid bruit.  Normal pedal pulses.  Abdomen: Soft, nontender, no hepatosplenomegaly, no distention.  Skin: Intact without lesions or rashes.  Neurologic: Alert and oriented x 3.  Psych: Normal affect Extremities: No clubbing or cyanosis. No edema. Extremities warm.    Assessment/Plan: 1. Chronic systolic CHF: Nonischemic cardiomyopathy.  St Jude ICD.  Echo 1/18 with EF 20% and moderate to severely decreased RV systolic function.     - NYHA II - Volume status stable.  - Continue Coreg to 9.375 mg bid.  - Continue hydralazine 100mg  TID - Continue Imdur 90mg  daily.   - No ACEI/ARB/ARNI or spironolactone with CKD.  - With LBBB-like IVCD, would favor CRT upgrade.  Saw Dr. Lovena Le in 06/2016 and he suggested watchful waiting.  2. Atrial fibrillation: Paroxysmal.   - Continue  amiodarone 200mg   LFTs and TSH normal in 04/2016.   - He understands to get yearly eye exams.  - Continue warfarin for anticoagulation.  3. HTN:  - Hypertensive today. He has not taken his medications. He will take them as soon as he leaves here. No dizziness, headaches.  - His BP is well controlled when he takes his medications, will not adjust any today. We talked about compliance and how he may not feel bad when his BP is high, but it is damaging to his organs further worsening his overall health.  4. Diabetes:  - A1c is 9.0 - Follows with internal medicine.  5. VT: No episodes noted on CorVue - Continue  Amiodarone  6. CKD: Stage IV.   - Baseline creatinine 2.7-3. Sometimes as high as 3.6-3.9 - Followed by Dr. Lorrene Reid - Last visit was 06/23/16 - He currently has no access for HD, plan is for access to be placed once his GFR is consistently <20.  - GFR in the 30's.    Follow up in 2 weeks. Will schedule repeat Echo in June.   Arbutus Leas 07/06/2016

## 2016-07-06 NOTE — Patient Instructions (Signed)
Follow up in 2 weeks.    Echo has been ordered for you to have done in June, 2018.  We will schedule your echo at check out.

## 2016-07-10 ENCOUNTER — Other Ambulatory Visit (HOSPITAL_COMMUNITY): Payer: Self-pay

## 2016-07-10 NOTE — Progress Notes (Signed)
Paramedicine Encounter    Patient ID: Travis Bryan, male    DOB: 1970-07-04, 46 y.o.   MRN: [id number]   Patient Care Team: Zada Finders, MD as PCP - General (Internal Medicine)  Patient Active Problem List   Diagnosis Date Noted  . Healthcare maintenance 06/24/2016  . Persistent atrial fibrillation ([place])   . Chronic kidney disease, stage IV (severe) ([place])   . Typical atrial flutter ([place])   . CARDIOMYOPATHY, SECONDARY 07/03/2008  . Chronic systolic heart failure ([place]) 07/03/2008  . Automatic implantable cardioverter-defibrillator in situ 07/03/2008  . Type 2 diabetes mellitus ([place]) 12/26/2005  . DYSLIPIDEMIA 12/26/2005  . Essential hypertension 12/26/2005    Current Outpatient Prescriptions:  .  amiodarone (PACERONE) 200 MG tablet, Take 200 mg by mouth daily., Disp: , Rfl:  .  amLODipine (NORVASC) 5 MG tablet, Take 1 tablet (5 mg total) by mouth daily., Disp: 30 tablet, Rfl: 6 .  carvedilol (COREG) 6.25 MG tablet, Take 1.5 tablets (9.375 mg total) by mouth 2 (two) times daily with a meal., Disp: 90 tablet, Rfl: 3 .  hydrALAZINE (APRESOLINE) 100 MG tablet, Take 1 tablet (100 mg total) by mouth 3 (three) times daily., Disp: 90 tablet, Rfl: 2 .  insulin aspart (NOVOLOG) 100 UNIT/ML injection, Inject 6 Units into the skin daily with breakfast., Disp: 10 mL, Rfl: 11 .  insulin glargine (LANTUS) 100 UNIT/ML injection, Inject 0.25 mLs (25 Units total) into the skin at bedtime., Disp: 10 mL, Rfl: 11 .  isosorbide mononitrate (IMDUR) 30 MG 24 hr tablet, Take 3 tablets (90 mg total) by mouth daily., Disp: 270 tablet, Rfl: 3 .  lidocaine (LIDODERM) 5 %, Place 1 patch onto the skin daily. Remove & Discard patch within 12 hours or as directed by MD, Disp: 30 patch, Rfl: 0 .  magnesium oxide (MAG-OX) 400 (241.3 Mg) MG tablet, Take 1 tablet (400 mg total) by mouth daily., Disp: 30 tablet, Rfl: 6 .  metolazone (ZAROXOLYN) 2.5 MG tablet, Take 1 tablet (2.5 mg total) by mouth as needed. Take as  directed, Disp: 5 tablet, Rfl: 0 .  potassium chloride SA (K-DUR,KLOR-CON) 20 MEQ tablet, Take 2 tablets (40 mEq total) by mouth daily., Disp: 60 tablet, Rfl: 3 .  ranolazine (RANEXA) 500 MG 12 hr tablet, Take 1 tablet (500 mg total) by mouth 2 (two) times daily., Disp: 60 tablet, Rfl: 6 .  torsemide (DEMADEX) 20 MG tablet, Take 60 mg by mouth daily., Disp: , Rfl:  .  warfarin (COUMADIN) 5 MG tablet, Take 1 tablet (5 mg total) by mouth daily. Take As Directed by Coumadin Clinic, Disp: 30 tablet, Rfl: 1 Allergies  Allergen Reactions  . No Known Allergies      Social History   Social History  . Marital status: Widowed    Spouse name: N/A  . Number of children: N/A  . Years of education: N/A   Occupational History  . Not on file.   Social History Main Topics  . Smoking status: Former Smoker    Years: 10.00    Types: Cigars    Quit date: 07/28/2002  . Smokeless tobacco: Never Used  . Alcohol use No     Comment: 03/28/2016 "stopped drinking 4-5 months ago"  . Drug use: No  . Sexual activity: Not Currently   Other Topics Concern  . Not on file   Social History Narrative  . No narrative on file    Physical Exam  Constitutional: He is oriented to person, place, and time.  Neck: Normal range of motion. No JVD present.  Pulmonary/Chest: Effort normal and breath sounds normal. No respiratory distress. He has no wheezes. He has no rales.  Abdominal: He exhibits no distension.  Musculoskeletal: He exhibits no edema.  Left knee is swollen and becomes sore/stiff after sitting for long periods of time. This began on Friday (4.27.18) Distal pulses were intact. No edema was seen anywhere else.  Neurological: He is alert and oriented to person, place, and time.  Skin: Skin is warm and dry. He is not diaphoretic.        Future Appointments Date Time Provider [place]  07/11/2016 11:45 AM [facility]  07/18/2016 9:15 AM Chauncey Reading Plyler,  RD [facility]  07/20/2016 10:00 AM [facility] PA/NP [facility] None  09/05/2016 10:00 AM MC ECHO 1-BUZZ [facility]-ECHOL[facility]  09/06/2016 1:15 PM Zada Finders, MD [facility]  09/14/2016 3:35 PM [facility] DEVICE REMOTES [facility]   BP (!) 136/96 (BP Location: Right Arm, Patient Position: Sitting, Cuff Size: Normal)   Pulse 68   Resp 16   Wt 225 lb 9.6 oz (102.3 kg)   SpO2 93%   BMI 32.37 kg/m  Weight yesterday-Did not weigh Last visit weight-229 lbs  Travis Bryan is seen at home today and reports feeling generally well. He denied SOB, H/A, dizziness or orthopnea. He was filling his pillbox when I arrived and said he did not need help with it. His medications were verified. His only complaint today was left knee swelling which began on Friday. He denied pain while standing but was limping. There were not obvious injuries and he was not able to think of anything he did that could have caused it. Distal pulses were intact and there was no difference in sensation between knees. I advised him to call his doctor at internal medicine to be seen but he said he wanted to "go to the ER after work." I tried to convince him to be seen before work and avoid the ER if possible but he insisted. His BP was elevated today, as it was at the last visit but he had not taken any of his medications this morning. He has not been weighing daily and we talked about why this is important and he said he will start again.   Travis Bryan, EMT 07/10/16  ACTION: Home visit completed Next visit planned for 1 week

## 2016-07-11 ENCOUNTER — Ambulatory Visit (INDEPENDENT_AMBULATORY_CARE_PROVIDER_SITE_OTHER): Payer: BLUE CROSS/BLUE SHIELD | Admitting: *Deleted

## 2016-07-11 DIAGNOSIS — I4819 Other persistent atrial fibrillation: Secondary | ICD-10-CM

## 2016-07-11 DIAGNOSIS — I483 Typical atrial flutter: Secondary | ICD-10-CM

## 2016-07-11 DIAGNOSIS — I481 Persistent atrial fibrillation: Secondary | ICD-10-CM | POA: Diagnosis not present

## 2016-07-11 DIAGNOSIS — Z5181 Encounter for therapeutic drug level monitoring: Secondary | ICD-10-CM | POA: Diagnosis not present

## 2016-07-11 DIAGNOSIS — Z95810 Presence of automatic (implantable) cardiac defibrillator: Secondary | ICD-10-CM | POA: Diagnosis not present

## 2016-07-11 LAB — POCT INR: INR: 1.3

## 2016-07-11 MED ORDER — WARFARIN SODIUM 5 MG PO TABS: ORAL_TABLET | ORAL | 0 refills | Status: DC

## 2016-07-11 MED FILL — WARFARIN SODIUM 5 MG TABLET: 5 | 30 days supply | Qty: 45 | Fill #0

## 2016-07-12 MED FILL — AMLODIPINE BESYLATE 5 MG TA: 5 | 30 days supply | Qty: 30 | Fill #2

## 2016-07-12 MED FILL — POTASSIUM CL ER 20 MEQ TABL: 20 | 30 days supply | Qty: 60 | Fill #1

## 2016-07-12 MED FILL — ISOSORBIDE MN ER 30 MG TAB: 30 | 30 days supply | Qty: 90 | Fill #3

## 2016-07-12 MED FILL — hydrALAZINE HCL 100 MG TABS: 100 | 30 days supply | Qty: 90 | Fill #2

## 2016-07-12 MED FILL — RANEXA ER 500 MG TABLET: 500 | 30 days supply | Qty: 60 | Fill #3

## 2016-07-13 MED FILL — NovoLOG 100 UNIT/ML SOLN: 100 | 17 days supply | Qty: 10 | Fill #1

## 2016-07-18 ENCOUNTER — Ambulatory Visit (INDEPENDENT_AMBULATORY_CARE_PROVIDER_SITE_OTHER): Payer: BLUE CROSS/BLUE SHIELD | Admitting: Dietician

## 2016-07-18 ENCOUNTER — Ambulatory Visit (INDEPENDENT_AMBULATORY_CARE_PROVIDER_SITE_OTHER): Payer: BLUE CROSS/BLUE SHIELD | Admitting: *Deleted

## 2016-07-18 DIAGNOSIS — Z713 Dietary counseling and surveillance: Secondary | ICD-10-CM

## 2016-07-18 DIAGNOSIS — Z95810 Presence of automatic (implantable) cardiac defibrillator: Secondary | ICD-10-CM

## 2016-07-18 DIAGNOSIS — I4891 Unspecified atrial fibrillation: Secondary | ICD-10-CM

## 2016-07-18 DIAGNOSIS — Z794 Long term (current) use of insulin: Secondary | ICD-10-CM

## 2016-07-18 DIAGNOSIS — I4892 Unspecified atrial flutter: Secondary | ICD-10-CM

## 2016-07-18 DIAGNOSIS — E1165 Type 2 diabetes mellitus with hyperglycemia: Secondary | ICD-10-CM | POA: Diagnosis not present

## 2016-07-18 DIAGNOSIS — Z6833 Body mass index (BMI) 33.0-33.9, adult: Secondary | ICD-10-CM | POA: Diagnosis not present

## 2016-07-18 DIAGNOSIS — I4819 Other persistent atrial fibrillation: Secondary | ICD-10-CM

## 2016-07-18 DIAGNOSIS — I483 Typical atrial flutter: Secondary | ICD-10-CM | POA: Diagnosis not present

## 2016-07-18 DIAGNOSIS — I481 Persistent atrial fibrillation: Secondary | ICD-10-CM

## 2016-07-18 DIAGNOSIS — Z5181 Encounter for therapeutic drug level monitoring: Secondary | ICD-10-CM | POA: Diagnosis not present

## 2016-07-18 LAB — GLUCOSE, CAPILLARY: Glucose-Capillary: 96 mg/dL (ref 65–99)

## 2016-07-18 LAB — POCT INR: INR: 1.5

## 2016-07-18 NOTE — Patient Instructions (Addendum)
PLEASE MAKE AN APPOINTMENT WITH Travis Bryan in November 2018  Dr. Katy Fitch - EYE DOCTOR- Friday May 18th at 11 AM-    Please cut toenails carefully.   Consider drinking smaller amount of juice at breakfast  Checking blood sugar before lunch and sometimes before dinner.

## 2016-07-18 NOTE — Progress Notes (Signed)
  Medical Nutrition Therapy:  Appt start time: 0900 end time:  1000. Visit # 1  Patient says he met with me before. It must of been before our new EMR came in 2012.   Assessment:  Primary concerns today: no concerns  "I have always been big". He is content with his weight 220-230#. He has been told a healthy weight is ~ 215# He eats 3 meals and 1-2 snacks a day. He says he does not add salt to foods. Lunch is out often. He drinks sweet tea, large glass of orange juice, ice cream at bedtime adding extra calories and likely increasing his blood sugar. He is active at work, but not outside of work.  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 He missed his eye appointment today which was scheduled at the same tine as this visit. This was rescheduled.  Has not seen a dentist in >5 years. His feet are in good shape, but his toenails are very long. I suggested podiatry, but he wanted to cut them. Written and oral information about how to cut them safely was provided today Preferred Learning Style: No preference indicated  Learning Readiness: Contemplating  ANTHROPOMETRICS: weight-231.1#, height-69.5", BMI-33 64- obese class 1 WEIGHT HISTORY: Highest:  Lowest-170-210# in his teens and 20s SLEEP:4-8 hours a night, mostly 4-6 on weeknights No  Nausea,Vomiting,Diarrhea, Constipation,hair loss reported  MEDICATIONS: lantus 25 units at bedtime, reports 100% adherence  BLOOD SUGAR:no meter with him today, reports 98 last night before bed, 939,030,09,23,30,07, only two high at 242 and 231 in past  Few weeksDIETARY INTAKE: Usual eating pattern includes 3 meals and 2 snacks per day.    Dining Out (times/week): 5 days a week for lunch 24-hr recall:  B ( 10 AM): grits, 3/4 cup, eggs x2, turley sausage, 12-16 oz orange juice at home L ( 1-2 PM): subway 6 and 12 " no veggies, american cheese, tuna or chicken on [place] herb and cheese Snk ( 630 PM): few sips regular green ta D ( 830 PM): green  tea, [employer] entress from his aunt- meat, starch and veggie Snk ( PM): watches tv before bed after work Beverages: water, regular sweet tea two times a day, milk with cereal- frosted flakes & cheerios as snacks  Progress Towards Goal(s):  No progress.   Nutritional Diagnosis:  NB-1.3 Not ready for diet/lifestyle change  As related to lack of desire to change.  As evidenced by his lack of concerns, questions or desire to change lifestyle.    Intervention:  Nutrition education about general diabetes self care and nutrtion self care for diabetes and kidney disease. . Coordination of care: none needed today  Teaching Method Utilized: Visual, Auditory, Hands on Handouts given during visit include: Barriers to learning/adherence to lifestyle change: competing values Demonstrated degree of understanding via:  Teach Back   Monitoring/Evaluation:  Dietary intake, exercise, meter, and body weight in 6 month(s). [place], [place] 07/18/2016 4:48 PM.

## 2016-07-20 ENCOUNTER — Ambulatory Visit (HOSPITAL_COMMUNITY)
Admission: RE | Admit: 2016-07-20 | Discharge: 2016-07-20 | Disposition: A | Discharge status: Home / Self Care | Payer: BLUE CROSS/BLUE SHIELD | Source: Ambulatory Visit | Attending: Internal Medicine | Admitting: Internal Medicine

## 2016-07-20 ENCOUNTER — Encounter (HOSPITAL_COMMUNITY): Payer: Self-pay

## 2016-07-20 VITALS — BP 130/88 | HR 74 | Wt 232.2 lb

## 2016-07-20 DIAGNOSIS — I5022 Chronic systolic (congestive) heart failure: Secondary | ICD-10-CM | POA: Insufficient documentation

## 2016-07-20 DIAGNOSIS — I429 Cardiomyopathy, unspecified: Secondary | ICD-10-CM | POA: Insufficient documentation

## 2016-07-20 DIAGNOSIS — I110 Hypertensive heart disease with heart failure: Secondary | ICD-10-CM | POA: Diagnosis not present

## 2016-07-20 DIAGNOSIS — I480 Paroxysmal atrial fibrillation: Secondary | ICD-10-CM | POA: Insufficient documentation

## 2016-07-20 DIAGNOSIS — I472 Ventricular tachycardia, unspecified: Secondary | ICD-10-CM

## 2016-07-20 DIAGNOSIS — I447 Left bundle-branch block, unspecified: Secondary | ICD-10-CM | POA: Diagnosis not present

## 2016-07-20 DIAGNOSIS — E1122 Type 2 diabetes mellitus with diabetic chronic kidney disease: Secondary | ICD-10-CM | POA: Insufficient documentation

## 2016-07-20 DIAGNOSIS — Z9889 Other specified postprocedural states: Secondary | ICD-10-CM | POA: Diagnosis not present

## 2016-07-20 DIAGNOSIS — I2510 Atherosclerotic heart disease of native coronary artery without angina pectoris: Secondary | ICD-10-CM | POA: Diagnosis not present

## 2016-07-20 DIAGNOSIS — Z87891 Personal history of nicotine dependence: Secondary | ICD-10-CM | POA: Insufficient documentation

## 2016-07-20 DIAGNOSIS — Z794 Long term (current) use of insulin: Secondary | ICD-10-CM | POA: Insufficient documentation

## 2016-07-20 DIAGNOSIS — I4892 Unspecified atrial flutter: Secondary | ICD-10-CM | POA: Insufficient documentation

## 2016-07-20 DIAGNOSIS — I428 Other cardiomyopathies: Secondary | ICD-10-CM

## 2016-07-20 DIAGNOSIS — Z9114 Patient's other noncompliance with medication regimen: Secondary | ICD-10-CM | POA: Insufficient documentation

## 2016-07-20 DIAGNOSIS — N184 Chronic kidney disease, stage 4 (severe): Secondary | ICD-10-CM | POA: Insufficient documentation

## 2016-07-20 DIAGNOSIS — I130 Hypertensive heart and chronic kidney disease with heart failure and stage 1 through stage 4 chronic kidney disease, or unspecified chronic kidney disease: Secondary | ICD-10-CM | POA: Insufficient documentation

## 2016-07-20 DIAGNOSIS — Z7901 Long term (current) use of anticoagulants: Secondary | ICD-10-CM | POA: Insufficient documentation

## 2016-07-20 MED ORDER — CARVEDILOL 12.5 MG PO TABS: 12.5000 mg | ORAL_TABLET | Freq: Two times a day (BID) | ORAL | 6 refills | Status: DC

## 2016-07-20 MED FILL — CARVEDILOL 12.5 MG TABLET: 12.5 | 30 days supply | Qty: 60 | Fill #0

## 2016-07-20 NOTE — Patient Instructions (Signed)
INCREASE Coreg to 12.5 mg, one tab twice a day   Your physician recommends that you schedule a follow-up appointment in: with Dr Aundra Dubin in 6-8 weeks and echo  Your physician has requested that you have an echocardiogram. Echocardiography is a painless test that uses sound waves to create images of your heart. It provides your doctor with information about the size and shape of your heart and how well your heart's chambers and valves are working. This procedure takes approximately one hour. There are no restrictions for this procedure.

## 2016-07-20 NOTE — Progress Notes (Signed)
PCP: Dr. Martyn Malay HF Cardiology: Dr. Aundra Dubin EP: Dr. Lovena Le Nephrology: Dr Lorrene Reid   Travis Bryan is a 35 year oold with a history of PAF, uncontrolled DM, CKD Stage IV, NICM, and VT.  Cardiomyopathy has been known for a number of years. Cath in 6/06 showed no obstructive coronary disease.  He had ICD discharge for VT in 10/17 and again in 1/18, both times likely in the setting of CHF exacerbation.    He was admitted in 1/18 with exertional dyspnea/CHF exacerbation.  ICD had also discharged for VT.  He was noted to be in new atrial flutter.  He had a flutter ablation.  However, post-procedure, he developed AKI and cardiogenic shock.  Dobutamine and norepinephrine were required but eventually titrated off.  He developed atrial fibrillation subsequent to the atrial flutter ablation.  He had DCCV to NSR 04/11/16.  He is on amiodarone and ranolazine. He is in NSR today.   Today he returns for HF follow up. Last visit he had not been taking medications and he was hypertensive. He instructed to take all HF meds. Overall feeling good. Denies SOB/PND/Orthopnea. Able to walk up steps without difficulty. No ICD shocks. He is not weighing at home. No bleeding problems. Tries to follow low salt diet. No fever or chills. Working full time. Taking all medications.   Labs  06/22/2016: K 3.7 creatinine 2.61    PMH:  1. Chronic systolic CHF: Nonischemic cardiomyopathy.  St Jude ICD.  - LHC (6/06): Nonobstructive CAD.  - Echo (1/18): EF 20%, moderate RV dilation with moderate-severely decreased RV systolic function, PASP 48 mmHg.  - Cardiolite (1/18): EF 7%, large fixed inferolateral defect no ischemia.  2. Atrial flutter: s/p ablation 1/18.  3. Atrial fibrillation: Paroxysmal.  S/p DCCV 1/18.  4. CKD: Stage IV. 5. Type II diabetes: Poorly controlled over time.  6. VT: 10/17 and 1/18 with ICD discharge.    Social History   Social History  . Marital status: Widowed    Spouse name: N/A  . Number of  children: N/A  . Years of education: N/A   Social History Main Topics  . Smoking status: Former Smoker    Years: 10.00    Types: Cigars    Quit date: 07/28/2002  . Smokeless tobacco: Never Used  . Alcohol use No     Comment: 03/28/2016 "stopped drinking 4-5 months ago"  . Drug use: No  . Sexual activity: Not Currently   Other Topics Concern  . Not on file   Social History Narrative  . No narrative on file   Family History  Problem Relation Age of Onset  . Hypertension Mother   . Heart disease Mother   . Diabetes Mother    ROS: All systems reviewed and negative except as per HPI.   Current Outpatient Prescriptions  Medication Sig Dispense Refill  . amiodarone (PACERONE) 200 MG tablet Take 200 mg by mouth daily.    Marland Kitchen amLODipine (NORVASC) 5 MG tablet Take 1 tablet (5 mg total) by mouth daily. 30 tablet 6  . carvedilol (COREG) 6.25 MG tablet Take 1.5 tablets (9.375 mg total) by mouth 2 (two) times daily with a meal. 90 tablet 3  . hydrALAZINE (APRESOLINE) 100 MG tablet Take 1 tablet (100 mg total) by mouth 3 (three) times daily. 90 tablet 2  . insulin aspart (NOVOLOG) 100 UNIT/ML injection Inject 6 Units into the skin daily with breakfast. 10 mL 11  . insulin glargine (LANTUS) 100 UNIT/ML  injection Inject 0.25 mLs (25 Units total) into the skin at bedtime. 10 mL 11  . isosorbide mononitrate (IMDUR) 30 MG 24 hr tablet Take 3 tablets (90 mg total) by mouth daily. 270 tablet 3  . lidocaine (LIDODERM) 5 % Place 1 patch onto the skin daily. Remove & Discard patch within 12 hours or as directed by MD 30 patch 0  . magnesium oxide (MAG-OX) 400 (241.3 Mg) MG tablet Take 1 tablet (400 mg total) by mouth daily. 30 tablet 6  . potassium chloride SA (K-DUR,KLOR-CON) 20 MEQ tablet Take 2 tablets (40 mEq total) by mouth daily. 60 tablet 3  . ranolazine (RANEXA) 500 MG 12 hr tablet Take 1 tablet (500 mg total) by mouth 2 (two) times daily. 60 tablet 6  . torsemide (DEMADEX) 20 MG tablet Take 60  mg by mouth daily.    Marland Kitchen warfarin (COUMADIN) 5 MG tablet Take As Directed by [facility]madin Clinic 45 tablet 0  . metolazone (ZAROXOLYN) 2.5 MG tablet Take 1 tablet (2.5 mg total) by mouth as needed. Take as directed (Patient not taking: Reported on 07/20/2016) 5 tablet 0   No current facility-administered medications for this encounter.    BP 130/88   Pulse 74   Wt 232 lb 4 oz (105.3 kg)   SpO2 97%   BMI 33.81 kg/m   Filed Weights   07/20/16 1001  Weight: 232 lb 4 oz (105.3 kg)    General:  Well appearing. No resp difficulty. Ambulated in the clinic without difficulty HEENT: normal except poor dentition Neck: supple. JVP 5-6 . Carotids 2+ bilat; no bruits. No lymphadenopathy or thryomegaly appreciated. Cor: PMI nondisplaced. Regular rate & rhythm. No rubs, gallops or murmurs. Lungs: clear Abdomen: soft, nontender, nondistended. No hepatosplenomegaly. No bruits or masses. Good bowel sounds. Extremities: no cyanosis, clubbing, rash, edema Neuro: alert & orientedx3, cranial nerves grossly intact. moves all 4 extremities w/o difficulty. Affect pleasant    Assessment/Plan: 1. Chronic systolic CHF: Nonischemic cardiomyopathy.  St Jude ICD.  Echo 1/18 with EF 20% and moderate to severely decreased RV systolic function.     -NYHA 1. Volume status stable.  Increase coreg to 12.5 mg twice a day.  Continue hydralazine and imdur at current dose.  - No Ace/ arni/spiro with ckd    - With LBBB-like IVCD, would favor CRT upgrade.  Saw Dr. Lovena Le in 06/2016 and he suggested watchful waiting.  Repeat ECHO in June  2. Atrial fibrillation: Paroxysmal.   - Regular pulse. Continue amiodarone 200 mg daily.   LFTs and TSH normal in 04/2016.   - He understands to get yearly eye exams.  - On coumadin. No bleeding problems.   3. HTN:  -Continue current regimen. Improved with medication compliance.  4. VT: No ICD shocks  - Continue Amiodarone  5. CKD: Stage IV.   - Baseline creatinine 2.7-3. Most recent  creatinine 2.6  - Followed by Dr. Lorrene Reid 6. Medication noncompliance: Reinforced medication adherence.   Greater than 50% of the (total minutes 25) visit spent in counseling/coordination of care regarding heart failure, renal disease, and medication compliance.  Follow up 6-8 weeks with Dr Aundra Dubin.   Catie Chiao NP-C  10:19 AM

## 2016-07-21 ENCOUNTER — Other Ambulatory Visit (HOSPITAL_COMMUNITY): Payer: Self-pay

## 2016-07-21 NOTE — Progress Notes (Signed)
Paramedicine Encounter    Patient ID: Travis Bryan, male    DOB: 13-Feb-1971, 46 y.o.   MRN: [id number]   Patient Care Team: Zada Finders, MD as PCP - General (Internal Medicine)  Patient Active Problem List   Diagnosis Date Noted  . Healthcare maintenance 06/24/2016  . Persistent atrial fibrillation ([place])   . Chronic kidney disease, stage IV (severe) ([place])   . Typical atrial flutter ([place])   . CARDIOMYOPATHY, SECONDARY 07/03/2008  . Chronic systolic heart failure ([place]) 07/03/2008  . Automatic implantable cardioverter-defibrillator in situ 07/03/2008  . Type 2 diabetes mellitus ([place]) 12/26/2005  . DYSLIPIDEMIA 12/26/2005  . Essential hypertension 12/26/2005    Current Outpatient Prescriptions:  .  amiodarone (PACERONE) 200 MG tablet, Take 200 mg by mouth daily., Disp: , Rfl:  .  amLODipine (NORVASC) 5 MG tablet, Take 1 tablet (5 mg total) by mouth daily., Disp: 30 tablet, Rfl: 6 .  carvedilol (COREG) 12.5 MG tablet, Take 1 tablet (12.5 mg total) by mouth 2 (two) times daily with a meal., Disp: 60 tablet, Rfl: 6 .  hydrALAZINE (APRESOLINE) 100 MG tablet, Take 1 tablet (100 mg total) by mouth 3 (three) times daily., Disp: 90 tablet, Rfl: 2 .  insulin aspart (NOVOLOG) 100 UNIT/ML injection, Inject 6 Units into the skin daily with breakfast., Disp: 10 mL, Rfl: 11 .  insulin glargine (LANTUS) 100 UNIT/ML injection, Inject 0.25 mLs (25 Units total) into the skin at bedtime., Disp: 10 mL, Rfl: 11 .  isosorbide mononitrate (IMDUR) 30 MG 24 hr tablet, Take 3 tablets (90 mg total) by mouth daily., Disp: 270 tablet, Rfl: 3 .  lidocaine (LIDODERM) 5 %, Place 1 patch onto the skin daily. Remove & Discard patch within 12 hours or as directed by MD, Disp: 30 patch, Rfl: 0 .  magnesium oxide (MAG-OX) 400 (241.3 Mg) MG tablet, Take 1 tablet (400 mg total) by mouth daily., Disp: 30 tablet, Rfl: 6 .  potassium chloride SA (K-DUR,KLOR-CON) 20 MEQ tablet, Take 2 tablets (40 mEq total) by mouth daily.,  Disp: 60 tablet, Rfl: 3 .  ranolazine (RANEXA) 500 MG 12 hr tablet, Take 1 tablet (500 mg total) by mouth 2 (two) times daily., Disp: 60 tablet, Rfl: 6 .  torsemide (DEMADEX) 20 MG tablet, Take 60 mg by mouth daily., Disp: , Rfl:  .  warfarin (COUMADIN) 5 MG tablet, Take As Directed by Coumadin Clinic, Disp: 45 tablet, Rfl: 0 .  metolazone (ZAROXOLYN) 2.5 MG tablet, Take 1 tablet (2.5 mg total) by mouth as needed. Take as directed (Patient not taking: Reported on 07/20/2016), Disp: 5 tablet, Rfl: 0 Allergies  Allergen Reactions  . No Known Allergies      Social History   Social History  . Marital status: Widowed    Spouse name: N/A  . Number of children: N/A  . Years of education: N/A   Occupational History  . Not on file.   Social History Main Topics  . Smoking status: Former Smoker    Years: 10.00    Types: Cigars    Quit date: 07/28/2002  . Smokeless tobacco: Never Used  . Alcohol use No     Comment: 03/28/2016 "stopped drinking 4-5 months ago"  . Drug use: No  . Sexual activity: Not Currently   Other Topics Concern  . Not on file   Social History Narrative  . No narrative on file    Physical Exam  Constitutional: He is oriented to person, place, and time.  Neck: Normal  range of motion.  Cardiovascular: Normal rate and regular rhythm.   Abdominal: Soft. He exhibits no distension.  Musculoskeletal: Normal range of motion. He exhibits no edema.  Neurological: He is alert and oriented to person, place, and time.  Skin: Skin is warm and dry.        Future Appointments Date Time Provider [place]  07/28/2016 10:45 AM [facility]  09/06/2016 1:15 PM Zada Finders, MD [facility]  09/11/2016 9:00 AM [facility] ECHO 1-BUZZ [facility]  09/11/2016 10:20 AM Larey Dresser, MD [facility] None  09/14/2016 3:35 PM [facility] DEVICE REMOTES [facility]   BP 114/80 (BP Location: Left Arm, Patient Position: Sitting,  Cuff Size: Normal)   Pulse 64   Resp 16   Wt 231 lb (104.8 kg)   SpO2 91%   BMI 33.62 kg/m  Weight yesterday- 232 lbs Last visit weight- 225 lbs  Travis Bryan was seen at home today and reports feeling well. He is taking all medications as prdered and had his pill box filled when I arrived. I verified his medications and confirmed the pillbox was filled correctly. His SpO2 was low but he denied SOB. He did not appear edematous in his extremities and his lung sounds were clear and equal bilaterally. He denied SOB, H/A, dizziness or orthopnea.   Jacquiline Doe, EMT 07/21/16  ACTION: Home visit completed Next visit planned for 1 week

## 2016-07-27 ENCOUNTER — Other Ambulatory Visit: Payer: Self-pay | Admitting: Dietician

## 2016-07-27 DIAGNOSIS — E1165 Type 2 diabetes mellitus with hyperglycemia: Secondary | ICD-10-CM

## 2016-07-27 DIAGNOSIS — Z794 Long term (current) use of insulin: Principal | ICD-10-CM

## 2016-07-27 MED ORDER — "INSULIN SYRINGE-NEEDLE U-100 31G X 15/64"" 0.3 ML MISC": 12 refills | Status: DC

## 2016-07-27 MED FILL — NovoLOG 100 UNIT/ML SOLN: 100 | 17 days supply | Qty: 10 | Fill #2

## 2016-07-27 MED FILL — ULTICARE SYR 0.3 ML 30GX5/1: 30G X 5/16" | 50 days supply | Qty: 200 | Fill #0

## 2016-07-27 NOTE — Telephone Encounter (Signed)
Patient called requesting syringes.   Called [employer] for dental benefits: He only has medical. Left message to Informing patient and recommended he see dentist and pay out of pocket.

## 2016-07-28 ENCOUNTER — Other Ambulatory Visit (HOSPITAL_COMMUNITY): Payer: Self-pay

## 2016-07-28 ENCOUNTER — Ambulatory Visit (INDEPENDENT_AMBULATORY_CARE_PROVIDER_SITE_OTHER): Payer: BLUE CROSS/BLUE SHIELD | Admitting: *Deleted

## 2016-07-28 DIAGNOSIS — Z95810 Presence of automatic (implantable) cardiac defibrillator: Secondary | ICD-10-CM

## 2016-07-28 DIAGNOSIS — I483 Typical atrial flutter: Secondary | ICD-10-CM

## 2016-07-28 DIAGNOSIS — I481 Persistent atrial fibrillation: Secondary | ICD-10-CM | POA: Diagnosis not present

## 2016-07-28 DIAGNOSIS — Z5181 Encounter for therapeutic drug level monitoring: Secondary | ICD-10-CM

## 2016-07-28 DIAGNOSIS — I4819 Other persistent atrial fibrillation: Secondary | ICD-10-CM

## 2016-07-28 LAB — POCT INR: INR: 2.1

## 2016-07-28 NOTE — Progress Notes (Signed)
Paramedicine Encounter    Patient ID: Travis Bryan, male    DOB: 1970-03-29, 46 y.o.   MRN: [id number]   Patient Care Team: Travis Finders, MD as PCP - General (Internal Medicine)  Patient Active Problem List   Diagnosis Date Noted  . Healthcare maintenance 06/24/2016  . Persistent atrial fibrillation ([place])   . Chronic kidney disease, stage IV (severe) ([place])   . Typical atrial flutter ([place])   . CARDIOMYOPATHY, SECONDARY 07/03/2008  . Chronic systolic heart failure ([place]) 07/03/2008  . Automatic implantable cardioverter-defibrillator in situ 07/03/2008  . Type 2 diabetes mellitus ([place]) 12/26/2005  . DYSLIPIDEMIA 12/26/2005  . Essential hypertension 12/26/2005    Current Outpatient Prescriptions:  .  amiodarone (PACERONE) 200 MG tablet, Take 200 mg by mouth daily., Disp: , Rfl:  .  amLODipine (NORVASC) 5 MG tablet, Take 1 tablet (5 mg total) by mouth daily., Disp: 30 tablet, Rfl: 6 .  carvedilol (COREG) 12.5 MG tablet, Take 1 tablet (12.5 mg total) by mouth 2 (two) times daily with a meal., Disp: 60 tablet, Rfl: 6 .  hydrALAZINE (APRESOLINE) 100 MG tablet, Take 1 tablet (100 mg total) by mouth 3 (three) times daily., Disp: 90 tablet, Rfl: 2 .  insulin aspart (NOVOLOG) 100 UNIT/ML injection, Inject 6 Units into the skin daily with breakfast., Disp: 10 mL, Rfl: 11 .  insulin glargine (LANTUS) 100 UNIT/ML injection, Inject 0.25 mLs (25 Units total) into the skin at bedtime., Disp: 10 mL, Rfl: 11 .  Insulin Syringe-Needle U-100 31G X 15/64" 0.3 ML MISC, Use to inject insulin 4 times a day, Disp: 200 each, Rfl: 12 .  isosorbide mononitrate (IMDUR) 30 MG 24 hr tablet, Take 3 tablets (90 mg total) by mouth daily., Disp: 270 tablet, Rfl: 3 .  magnesium oxide (MAG-OX) 400 (241.3 Mg) MG tablet, Take 1 tablet (400 mg total) by mouth daily., Disp: 30 tablet, Rfl: 6 .  potassium chloride SA (K-DUR,KLOR-CON) 20 MEQ tablet, Take 2 tablets (40 mEq total) by mouth daily., Disp: 60 tablet, Rfl: 3 .   ranolazine (RANEXA) 500 MG 12 hr tablet, Take 1 tablet (500 mg total) by mouth 2 (two) times daily., Disp: 60 tablet, Rfl: 6 .  torsemide (DEMADEX) 20 MG tablet, Take 60 mg by mouth daily., Disp: , Rfl:  .  warfarin (COUMADIN) 5 MG tablet, Take As Directed by Coumadin Clinic, Disp: 45 tablet, Rfl: 0 .  lidocaine (LIDODERM) 5 %, Place 1 patch onto the skin daily. Remove & Discard patch within 12 hours or as directed by MD (Patient not taking: Reported on 07/28/2016), Disp: 30 patch, Rfl: 0 .  metolazone (ZAROXOLYN) 2.5 MG tablet, Take 1 tablet (2.5 mg total) by mouth as needed. Take as directed (Patient not taking: Reported on 07/20/2016), Disp: 5 tablet, Rfl: 0 Allergies  Allergen Reactions  . No Known Allergies      Social History   Social History  . Marital status: Widowed    Spouse name: N/A  . Number of children: N/A  . Years of education: N/A   Occupational History  . Not on file.   Social History Main Topics  . Smoking status: Former Smoker    Years: 10.00    Types: Cigars    Quit date: 07/28/2002  . Smokeless tobacco: Never Used  . Alcohol use No     Comment: 03/28/2016 "stopped drinking 4-5 months ago"  . Drug use: No  . Sexual activity: Not Currently   Other Topics Concern  . Not on  file   Social History Narrative  . No narrative on file    Physical Exam  Constitutional: He is oriented to person, place, and time.  Neck: Normal range of motion.  Cardiovascular: Normal rate and regular rhythm.   Pulmonary/Chest: Effort normal and breath sounds normal. No respiratory distress. He has no wheezes. He has no rales.  Abdominal: Soft. He exhibits no distension.  Musculoskeletal: Normal range of motion. He exhibits edema.  Neurological: He is alert and oriented to person, place, and time.  Skin: Skin is warm and dry.        Future Appointments Date Time Provider [place]  08/11/2016 11:15 AM [facility]  09/06/2016  1:15 PM Travis Finders, MD [facility]  09/11/2016 9:00 AM MC ECHO 1-BUZZ [facility]  09/11/2016 10:20 AM Larey Dresser, MD [facility] None  09/14/2016 3:35 PM [facility] DEVICE REMOTES [facility]   BP (!) 146/98 (BP Location: Left Arm, Patient Position: Sitting, Cuff Size: Normal)   Pulse 68   Resp 18   Wt 232 lb 12.8 oz (105.6 kg)   SpO2 95%   BMI 33.89 kg/m  Weight yesterday- did not weigh  Last visit weight- 231 lbs   Mr Travis Bryan was seen at home today and reports feeling well. He is taking all of his medications as ordered but has been inconsistently weighing himself. I explained the importance of daily weight to him again and he said he understood. He denied SOB, H/A, dizziness or orthopnea. He had bilateral lower leg edema. His pillbox was refilled but amiodarone ran out (has enough to get through Tuesday). It was ordered ordered and will be ready this afternoon but Mr Travis Bryan said he would pick it up Monday.  Travis Bryan, EMT 07/28/16  ACTION: Home visit completed Next visit planned for 1 week

## 2016-08-04 ENCOUNTER — Other Ambulatory Visit (HOSPITAL_COMMUNITY): Payer: Self-pay

## 2016-08-04 MED FILL — TORSEMIDE 20 MG TABLET: 20 | 30 days supply | Qty: 180 | Fill #1

## 2016-08-04 NOTE — Progress Notes (Signed)
Paramedicine Encounter    Patient ID: Travis Bryan, male    DOB: [birth year], 46 y.o.   MRN: [id number]   Patient Care Team: Zada Finders, MD as PCP - General (Internal Medicine)  Patient Active Problem List   Diagnosis Date Noted  . Healthcare maintenance 06/24/2016  . Persistent atrial fibrillation ([place])   . Chronic kidney disease, stage IV (severe) ([place])   . Typical atrial flutter ([place])   . CARDIOMYOPATHY, SECONDARY 07/03/2008  . Chronic systolic heart failure ([place]) 07/03/2008  . Automatic implantable cardioverter-defibrillator in situ 07/03/2008  . Type 2 diabetes mellitus ([place]) 12/26/2005  . DYSLIPIDEMIA 12/26/2005  . Essential hypertension 12/26/2005    Current Outpatient Prescriptions:  .  amiodarone (PACERONE) 200 MG tablet, Take 200 mg by mouth daily., Disp: , Rfl:  .  amLODipine (NORVASC) 5 MG tablet, Take 1 tablet (5 mg total) by mouth daily., Disp: 30 tablet, Rfl: 6 .  carvedilol (COREG) 12.5 MG tablet, Take 1 tablet (12.5 mg total) by mouth 2 (two) times daily with a meal., Disp: 60 tablet, Rfl: 6 .  hydrALAZINE (APRESOLINE) 100 MG tablet, Take 1 tablet (100 mg total) by mouth 3 (three) times daily., Disp: 90 tablet, Rfl: 2 .  insulin aspart (NOVOLOG) 100 UNIT/ML injection, Inject 6 Units into the skin daily with breakfast., Disp: 10 mL, Rfl: 11 .  insulin glargine (LANTUS) 100 UNIT/ML injection, Inject 0.25 mLs (25 Units total) into the skin at bedtime., Disp: 10 mL, Rfl: 11 .  Insulin Syringe-Needle U-100 31G X 15/64" 0.3 ML MISC, Use to inject insulin 4 times a day, Disp: 200 each, Rfl: 12 .  isosorbide mononitrate (IMDUR) 30 MG 24 hr tablet, Take 3 tablets (90 mg total) by mouth daily., Disp: 270 tablet, Rfl: 3 .  lidocaine (LIDODERM) 5 %, Place 1 patch onto the skin daily. Remove & Discard patch within 12 hours or as directed by MD (Patient not taking: Reported on 07/28/2016), Disp: 30 patch, Rfl: 0 .  magnesium oxide (MAG-OX) 400 (241.3 Mg) MG tablet, Take 1 tablet  (400 mg total) by mouth daily., Disp: 30 tablet, Rfl: 6 .  metolazone (ZAROXOLYN) 2.5 MG tablet, Take 1 tablet (2.5 mg total) by mouth as needed. Take as directed (Patient not taking: Reported on 07/20/2016), Disp: 5 tablet, Rfl: 0 .  potassium chloride SA (K-DUR,KLOR-CON) 20 MEQ tablet, Take 2 tablets (40 mEq total) by mouth daily., Disp: 60 tablet, Rfl: 3 .  ranolazine (RANEXA) 500 MG 12 hr tablet, Take 1 tablet (500 mg total) by mouth 2 (two) times daily., Disp: 60 tablet, Rfl: 6 .  torsemide (DEMADEX) 20 MG tablet, Take 60 mg by mouth daily., Disp: , Rfl:  .  warfarin (COUMADIN) 5 MG tablet, Take As Directed by [facility]in Clinic, Disp: 45 tablet, Rfl: 0 Allergies  Allergen Reactions  . No Known Allergies      Social History   Social History  . Marital status: Widowed    Spouse name: N/A  . Number of children: N/A  . Years of education: N/A   Occupational History  . Not on file.   Social History Main Topics  . Smoking status: Former Smoker    Years: 10.00    Types: Cigars    Quit date: 07/28/2002  . Smokeless tobacco: Never Used  . Alcohol use No     Comment: 03/28/2016 "stopped drinking 4-5 months ago"  . Drug use: No  . Sexual activity: Not Currently   Other Topics Concern  . Not on  file   Social History Narrative  . No narrative on file    Physical Exam  Constitutional: He is oriented to person, place, and time.  Neck: Normal range of motion.  Cardiovascular: Normal rate and regular rhythm.   Pulmonary/Chest: Effort normal and breath sounds normal. No respiratory distress. He has no wheezes. He has no rales.  Abdominal: Soft. He exhibits no distension.  Musculoskeletal: Normal range of motion. He exhibits no edema.  Neurological: He is alert and oriented to person, place, and time.  Skin: Skin is warm and dry.  Psychiatric: He has a normal mood and affect.        Future Appointments Date Time Provider [place]  08/11/2016 11:15 AM [facility]  09/06/2016 1:15 PM Zada Finders, MD [facility]  09/11/2016 9:00 AM MC ECHO 1-BUZZ [facility]  09/11/2016 10:20 AM Larey Dresser, MD MC-[facility] None  09/14/2016 3:35 PM [facility] DEVICE REMOTES [facility]   There were no vitals taken for this visit. Weight yesterday- 233.7 lbs Last visit weight- 232 lbs CBG- 70 mg/dl  Has not picked up amiodarone since last week and is out. He said the pharmacy had called to let him know that it was ready but he forgot to pick it up. I contacted the pharmacy and they advised it was still ready for pick-up and Torsemide was ordered. He said he would pick up both today. He did not take his afternoon dose of hydralazine all week because he forgets to take it to work with him. To fix this he requested to have the afternoon dose placed with his evening dose since he remembers to take that to work and he would take be sure to take them separately. His warfarin was in his bed time slot and it did not appear that he had taken in because it was all still present in all slots. He stated he prefers to have them in his evening slot so the pills were moved. He denied SOB, H/A, dizziness or orthopnea. 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 As a result he says he has felt bloated. He denied SOB during this time and says he did not have > 3lbs weight gain.  On Tuesday the following medications need to be ordered in order to have them ready for Friday; Potassium, Ranexa, Magnesium, Amlodipine, Isosorbide, Warfarin.  Jacquiline Doe, EMT 08/04/16  ACTION: Home visit completed Next visit planned for 1 week

## 2016-08-08 MED FILL — ISOSORBIDE MN ER 30 MG TAB: 30 | 30 days supply | Qty: 90 | Fill #4

## 2016-08-08 MED FILL — POTASSIUM CL ER 20 MEQ TABL: 20 | 30 days supply | Qty: 60 | Fill #2

## 2016-08-08 MED FILL — AMLODIPINE BESYLATE 5 MG TA: 5 | 30 days supply | Qty: 30 | Fill #3

## 2016-08-08 MED FILL — RANEXA ER 500 MG TABLET: 500 | 30 days supply | Qty: 60 | Fill #4

## 2016-08-08 MED FILL — LANTUS 100 UNITS/ML VIAL: 100 | 28 days supply | Qty: 10 | Fill #2

## 2016-08-11 ENCOUNTER — Encounter (INDEPENDENT_AMBULATORY_CARE_PROVIDER_SITE_OTHER): Payer: Self-pay

## 2016-08-11 ENCOUNTER — Ambulatory Visit (INDEPENDENT_AMBULATORY_CARE_PROVIDER_SITE_OTHER): Payer: BLUE CROSS/BLUE SHIELD | Admitting: *Deleted

## 2016-08-11 DIAGNOSIS — I483 Typical atrial flutter: Secondary | ICD-10-CM | POA: Diagnosis not present

## 2016-08-11 DIAGNOSIS — Z5181 Encounter for therapeutic drug level monitoring: Secondary | ICD-10-CM

## 2016-08-11 DIAGNOSIS — I4819 Other persistent atrial fibrillation: Secondary | ICD-10-CM

## 2016-08-11 DIAGNOSIS — Z95810 Presence of automatic (implantable) cardiac defibrillator: Secondary | ICD-10-CM

## 2016-08-11 DIAGNOSIS — I481 Persistent atrial fibrillation: Secondary | ICD-10-CM

## 2016-08-11 LAB — POCT INR: INR: 1.9

## 2016-08-14 NOTE — Addendum Note (Signed)
Addendum  created 08/14/16 1033 by Oleta Mouse, MD   Sign clinical note

## 2016-08-18 ENCOUNTER — Other Ambulatory Visit (HOSPITAL_COMMUNITY): Payer: Self-pay | Admitting: Pharmacist

## 2016-08-18 ENCOUNTER — Other Ambulatory Visit: Payer: Self-pay | Admitting: *Deleted

## 2016-08-18 ENCOUNTER — Other Ambulatory Visit (HOSPITAL_COMMUNITY): Payer: Self-pay

## 2016-08-18 MED ORDER — WARFARIN SODIUM 5 MG PO TABS
ORAL_TABLET | ORAL | 2 refills | Status: DC
Start: 2016-08-18 — End: 2016-11-14

## 2016-08-18 MED ORDER — METOLAZONE 2.5 MG PO TABS: 2.5000 mg | ORAL_TABLET | ORAL | 0 refills | Status: DC | PRN

## 2016-08-18 MED FILL — AMIODARONE HCL 200 MG TAB: 200 | 30 days supply | Qty: 30 | Fill #2

## 2016-08-18 MED FILL — WARFARIN SODIUM 5 MG TABLET: 5 | 30 days supply | Qty: 45 | Fill #0

## 2016-08-18 MED FILL — CARVEDILOL 12.5 MG TABLET: 12.5 | 30 days supply | Qty: 60 | Fill #1

## 2016-08-18 NOTE — Progress Notes (Signed)
Paramedicine Encounter    Patient ID: Mahmoud Blazejewski, male    DOB: 11-21-1970, 46 y.o.   MRN: [id number]   Patient Care Team: Zada Finders, MD as PCP - General (Internal Medicine)  Patient Active Problem List   Diagnosis Date Noted  . Healthcare maintenance 06/24/2016  . Persistent atrial fibrillation ([place])   . Chronic kidney disease, stage IV (severe) ([place])   . Typical atrial flutter ([place])   . CARDIOMYOPATHY, SECONDARY 07/03/2008  . Chronic systolic heart failure ([place]) 07/03/2008  . Automatic implantable cardioverter-defibrillator in situ 07/03/2008  . Type 2 diabetes mellitus ([place]) 12/26/2005  . DYSLIPIDEMIA 12/26/2005  . Essential hypertension 12/26/2005    Current Outpatient Prescriptions:  .  amiodarone (PACERONE) 200 MG tablet, Take 200 mg by mouth daily., Disp: , Rfl:  .  amLODipine (NORVASC) 5 MG tablet, Take 1 tablet (5 mg total) by mouth daily., Disp: 30 tablet, Rfl: 6 .  carvedilol (COREG) 12.5 MG tablet, Take 1 tablet (12.5 mg total) by mouth 2 (two) times daily with a meal., Disp: 60 tablet, Rfl: 6 .  hydrALAZINE (APRESOLINE) 100 MG tablet, Take 1 tablet (100 mg total) by mouth 3 (three) times daily., Disp: 90 tablet, Rfl: 2 .  insulin aspart (NOVOLOG) 100 UNIT/ML injection, Inject 6 Units into the skin daily with breakfast., Disp: 10 mL, Rfl: 11 .  insulin glargine (LANTUS) 100 UNIT/ML injection, Inject 0.25 mLs (25 Units total) into the skin at bedtime., Disp: 10 mL, Rfl: 11 .  Insulin Syringe-Needle U-100 31G X 15/64" 0.3 ML MISC, Use to inject insulin 4 times a day, Disp: 200 each, Rfl: 12 .  isosorbide mononitrate (IMDUR) 30 MG 24 hr tablet, Take 3 tablets (90 mg total) by mouth daily., Disp: 270 tablet, Rfl: 3 .  magnesium oxide (MAG-OX) 400 (241.3 Mg) MG tablet, Take 1 tablet (400 mg total) by mouth daily., Disp: 30 tablet, Rfl: 6 .  potassium chloride SA (K-DUR,KLOR-CON) 20 MEQ tablet, Take 2 tablets (40 mEq total) by mouth daily., Disp: 60 tablet, Rfl: 3 .   ranolazine (RANEXA) 500 MG 12 hr tablet, Take 1 tablet (500 mg total) by mouth 2 (two) times daily., Disp: 60 tablet, Rfl: 6 .  torsemide (DEMADEX) 20 MG tablet, Take 60 mg by mouth daily., Disp: , Rfl:  .  lidocaine (LIDODERM) 5 %, Place 1 patch onto the skin daily. Remove & Discard patch within 12 hours or as directed by MD (Patient not taking: Reported on 07/28/2016), Disp: 30 patch, Rfl: 0 .  metolazone (ZAROXOLYN) 2.5 MG tablet, Take 1 tablet (2.5 mg total) by mouth as needed. Take as directed, Disp: 5 tablet, Rfl: 0 .  warfarin (COUMADIN) 5 MG tablet, Take As Directed by Coumadin Clinic, Disp: 45 tablet, Rfl: 2 Allergies  Allergen Reactions  . No Known Allergies      Social History   Social History  . Marital status: Widowed    Spouse name: N/A  . Number of children: N/A  . Years of education: N/A   Occupational History  . Not on file.   Social History Main Topics  . Smoking status: Former Smoker    Years: 10.00    Types: Cigars    Quit date: 07/28/2002  . Smokeless tobacco: Never Used  . Alcohol use No     Comment: 03/28/2016 "stopped drinking 4-5 months ago"  . Drug use: No  . Sexual activity: Not Currently   Other Topics Concern  . Not on file   Social History Narrative  .  No narrative on file    Physical Exam  Constitutional: He is oriented to person, place, and time. He appears well-developed.  Neck: Normal range of motion.  Cardiovascular: Normal rate and regular rhythm.   Pulmonary/Chest: Effort normal and breath sounds normal. No respiratory distress. He has no wheezes. He has no rales.  Abdominal: Soft. He exhibits no distension. There is no tenderness.  Musculoskeletal: Normal range of motion. He exhibits no edema.  Neurological: He is alert and oriented to person, place, and time.  Skin: Skin is warm and dry.        Future Appointments Date Time Provider [place]  08/25/2016 11:15 AM [facility]   09/06/2016 1:15 PM Zada Finders, MD [facility]  09/11/2016 9:00 AM MC ECHO 1-BUZZ [facility]ECH[facility]  09/11/2016 10:20 AM Larey Dresser, MD MC-[facility]SC None  09/14/2016 3:35 PM [facility] DEVICE REMOTES [facility]   BP 116/78 (BP Location: Right Arm, Patient Position: Sitting, Cuff Size: Normal)   Pulse 66   Resp 16   Wt 228 lb (103.4 kg)   SpO2 93%   BMI 33.19 kg/m  Weight yesterday- 230 lbs Last visit weight- 232 lbs CBG- 167 mg/dL  Mr Carreker was seen at home today and reports feeling well. He does not have any edema noted and denied SOB, H/A or dizziness. He continues to sleep without a pillow at all and has been taking all of his medications as ordered. He had not picked up amiodarone, carvedilol, or warfarin but all were ready to be picked up at the pharmacy. He advised he would go get those medications this afternoon and put the in his box. I will follow up on Monday to via phone to ensure he has picked up the medications and also called in hydralazine.   Jacquiline Doe, EMT 08/18/16  ACTION: Home visit completed Next visit planned for 1 week

## 2016-08-25 ENCOUNTER — Other Ambulatory Visit (HOSPITAL_COMMUNITY): Payer: Self-pay | Admitting: Cardiology

## 2016-08-25 ENCOUNTER — Ambulatory Visit (INDEPENDENT_AMBULATORY_CARE_PROVIDER_SITE_OTHER): Payer: BLUE CROSS/BLUE SHIELD | Admitting: Pharmacist

## 2016-08-25 ENCOUNTER — Other Ambulatory Visit (HOSPITAL_COMMUNITY): Payer: Self-pay

## 2016-08-25 DIAGNOSIS — I481 Persistent atrial fibrillation: Secondary | ICD-10-CM | POA: Diagnosis not present

## 2016-08-25 DIAGNOSIS — I483 Typical atrial flutter: Secondary | ICD-10-CM | POA: Diagnosis not present

## 2016-08-25 DIAGNOSIS — Z95810 Presence of automatic (implantable) cardiac defibrillator: Secondary | ICD-10-CM

## 2016-08-25 DIAGNOSIS — I4819 Other persistent atrial fibrillation: Secondary | ICD-10-CM

## 2016-08-25 DIAGNOSIS — Z5181 Encounter for therapeutic drug level monitoring: Secondary | ICD-10-CM

## 2016-08-25 LAB — POCT INR: INR: 2.1

## 2016-08-25 NOTE — Progress Notes (Signed)
Paramedicine Encounter    Patient ID: Rosemary Pentecost, male    DOB: [birth year], 46 y.o.   MRN: [id number]   Patient Care Team: Zada Finders, MD as PCP - General (Internal Medicine)  Patient Active Problem List   Diagnosis Date Noted  . Healthcare maintenance 06/24/2016  . Persistent atrial fibrillation ([place])   . Chronic kidney disease, stage IV (severe) ([place])   . Typical atrial flutter ([place])   . CARDIOMYOPATHY, SECONDARY 07/03/2008  . Chronic systolic heart failure ([place]) 07/03/2008  . Automatic implantable cardioverter-defibrillator in situ 07/03/2008  . Type 2 diabetes mellitus ([place]) 12/26/2005  . DYSLIPIDEMIA 12/26/2005  . Essential hypertension 12/26/2005    Current Outpatient Prescriptions:  .  amiodarone (PACERONE) 200 MG tablet, Take 200 mg by mouth daily., Disp: , Rfl:  .  amLODipine (NORVASC) 5 MG tablet, Take 1 tablet (5 mg total) by mouth daily., Disp: 30 tablet, Rfl: 6 .  carvedilol (COREG) 12.5 MG tablet, Take 1 tablet (12.5 mg total) by mouth 2 (two) times daily with a meal., Disp: 60 tablet, Rfl: 6 .  hydrALAZINE (APRESOLINE) 100 MG tablet, Take 1 tablet (100 mg total) by mouth 3 (three) times daily., Disp: 90 tablet, Rfl: 2 .  insulin aspart (NOVOLOG) 100 UNIT/ML injection, Inject 6 Units into the skin daily with breakfast., Disp: 10 mL, Rfl: 11 .  insulin glargine (LANTUS) 100 UNIT/ML injection, Inject 0.25 mLs (25 Units total) into the skin at bedtime., Disp: 10 mL, Rfl: 11 .  Insulin Syringe-Needle U-100 31G X 15/64" 0.3 ML MISC, Use to inject insulin 4 times a day, Disp: 200 each, Rfl: 12 .  isosorbide mononitrate (IMDUR) 30 MG 24 hr tablet, Take 3 tablets (90 mg total) by mouth daily., Disp: 270 tablet, Rfl: 3 .  magnesium oxide (MAG-OX) 400 (241.3 Mg) MG tablet, Take 1 tablet (400 mg total) by mouth daily., Disp: 30 tablet, Rfl: 6 .  potassium chloride SA (K-DUR,KLOR-CON) 20 MEQ tablet, Take 2 tablets (40 mEq total) by mouth daily., Disp: 60 tablet, Rfl: 3 .   ranolazine (RANEXA) 500 MG 12 hr tablet, Take 1 tablet (500 mg total) by mouth 2 (two) times daily., Disp: 60 tablet, Rfl: 6 .  torsemide (DEMADEX) 20 MG tablet, Take 60 mg by mouth daily., Disp: , Rfl:  .  warfarin (COUMADIN) 5 MG tablet, Take As Directed by Coumadin Clinic, Disp: 45 tablet, Rfl: 2 .  lidocaine (LIDODERM) 5 %, Place 1 patch onto the skin daily. Remove & Discard patch within 12 hours or as directed by MD (Patient not taking: Reported on 07/28/2016), Disp: 30 patch, Rfl: 0 .  metolazone (ZAROXOLYN) 2.5 MG tablet, Take 1 tablet (2.5 mg total) by mouth as needed. Take as directed, Disp: 5 tablet, Rfl: 0 Allergies  Allergen Reactions  . No Known Allergies      Social History   Social History  . Marital status: Widowed    Spouse name: N/A  . Number of children: N/A  . Years of education: N/A   Occupational History  . Not on file.   Social History Main Topics  . Smoking status: Former Smoker    Years: 10.00    Types: Cigars    Quit date: 07/28/2002  . Smokeless tobacco: Never Used  . Alcohol use No     Comment: 03/28/2016 "stopped drinking 4-5 months ago"  . Drug use: No  . Sexual activity: Not Currently   Other Topics Concern  . Not on file   Social History Narrative  .  No narrative on file    Physical Exam  Constitutional: He is oriented to person, place, and time. He appears well-developed.  Neck: Normal range of motion.  Cardiovascular: Normal rate and regular rhythm.   Pulmonary/Chest: Effort normal and breath sounds normal. No respiratory distress. He has no wheezes. He has no rales.  Abdominal: Soft. He exhibits no distension.  Musculoskeletal: Normal range of motion. He exhibits no edema.  Neurological: He is alert and oriented to person, place, and time.  Skin: Skin is warm and dry.  Psychiatric: He has a normal mood and affect.        Future Appointments Date Time Provider [place]  09/06/2016 1:15 PM Zada Finders, MD [facility]   09/08/2016 8:30 AM [facility]  09/11/2016 9:00 AM MC ECHO 1-BUZZ MC-ECHOL[facility]  09/11/2016 10:20 AM Larey Dresser, MD MC-[facility] None  09/14/2016 3:35 PM [facility] DEVICE REMOTES [facility]   BP 136/84 (BP Location: Left Arm, Patient Position: Supine, Cuff Size: Normal)   Pulse 72   Resp 18   Wt 230 lb 12.8 oz (104.7 kg)   SpO2 98%   BMI 33.59 kg/m  Weight yesterday- 233 lbs Last visit weight- 228 lbs  Mr Lyles was seen at home today and reports feeling well. He denied SOB, H/A, dizziness or orthopnea. Yesterday his weight had increased by 5 pounds over the course of the week but he was back down today. 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 This is a possibility for his weight fluctuation. His medications were verified and his pillbox was refilled. All medications requiring refill were ordered from the pharmacy and will be ready for pick up by the beginning of next week. He denied needing anything further.  Jacquiline Doe, EMT-Paramedic 08/25/16  ACTION: Home visit completed Next visit planned for 1 week

## 2016-09-01 ENCOUNTER — Other Ambulatory Visit (HOSPITAL_COMMUNITY): Payer: Self-pay

## 2016-09-01 MED FILL — hydrALAZINE HCL 100 MG TABS: 100 | 30 days supply | Qty: 90 | Fill #0

## 2016-09-01 NOTE — Progress Notes (Signed)
Paramedicine Encounter    Patient ID: Travis Bryan, male    DOB: 1970/10/27, 46 y.o.   MRN: [id number]   Patient Care Team: Zada Finders, MD as PCP - General (Internal Medicine)  Patient Active Problem List   Diagnosis Date Noted  . Healthcare maintenance 06/24/2016  . Persistent atrial fibrillation ([place])   . Chronic kidney disease, stage IV (severe) ([place])   . Typical atrial flutter ([place])   . CARDIOMYOPATHY, SECONDARY 07/03/2008  . Chronic systolic heart failure ([place]) 07/03/2008  . Automatic implantable cardioverter-defibrillator in situ 07/03/2008  . Type 2 diabetes mellitus ([place]) 12/26/2005  . DYSLIPIDEMIA 12/26/2005  . Essential hypertension 12/26/2005    Current Outpatient Prescriptions:  .  amLODipine (NORVASC) 5 MG tablet, Take 1 tablet (5 mg total) by mouth daily., Disp: 30 tablet, Rfl: 6 .  carvedilol (COREG) 12.5 MG tablet, Take 1 tablet (12.5 mg total) by mouth 2 (two) times daily with a meal., Disp: 60 tablet, Rfl: 6 .  hydrALAZINE (APRESOLINE) 100 MG tablet, TAKE 1 TABLET BY MOUTH 3 TIMES DAILY., Disp: 90 tablet, Rfl: 2 .  insulin aspart (NOVOLOG) 100 UNIT/ML injection, Inject 6 Units into the skin daily with breakfast., Disp: 10 mL, Rfl: 11 .  insulin glargine (LANTUS) 100 UNIT/ML injection, Inject 0.25 mLs (25 Units total) into the skin at bedtime., Disp: 10 mL, Rfl: 11 .  Insulin Syringe-Needle U-100 31G X 15/64" 0.3 ML MISC, Use to inject insulin 4 times a day, Disp: 200 each, Rfl: 12 .  isosorbide mononitrate (IMDUR) 30 MG 24 hr tablet, Take 3 tablets (90 mg total) by mouth daily., Disp: 270 tablet, Rfl: 3 .  magnesium oxide (MAG-OX) 400 (241.3 Mg) MG tablet, Take 1 tablet (400 mg total) by mouth daily., Disp: 30 tablet, Rfl: 6 .  potassium chloride SA (K-DUR,KLOR-CON) 20 MEQ tablet, Take 2 tablets (40 mEq total) by mouth daily., Disp: 60 tablet, Rfl: 3 .  ranolazine (RANEXA) 500 MG 12 hr tablet, Take 1 tablet (500 mg total) by mouth 2 (two) times daily., Disp: 60  tablet, Rfl: 6 .  torsemide (DEMADEX) 20 MG tablet, Take 60 mg by mouth daily., Disp: , Rfl:  .  warfarin (COUMADIN) 5 MG tablet, Take As Directed by Coumadin Clinic, Disp: 45 tablet, Rfl: 2 .  amiodarone (PACERONE) 200 MG tablet, Take 200 mg by mouth daily., Disp: , Rfl:  .  lidocaine (LIDODERM) 5 %, Place 1 patch onto the skin daily. Remove & Discard patch within 12 hours or as directed by MD (Patient not taking: Reported on 07/28/2016), Disp: 30 patch, Rfl: 0 .  metolazone (ZAROXOLYN) 2.5 MG tablet, Take 1 tablet (2.5 mg total) by mouth as needed. Take as directed (Patient not taking: Reported on 09/01/2016), Disp: 5 tablet, Rfl: 0 Allergies  Allergen Reactions  . No Known Allergies      Social History   Social History  . Marital status: Widowed    Spouse name: N/A  . Number of children: N/A  . Years of education: N/A   Occupational History  . Not on file.   Social History Main Topics  . Smoking status: Former Smoker    Years: 10.00    Types: Cigars    Quit date: 07/28/2002  . Smokeless tobacco: Never Used  . Alcohol use No     Comment: 03/28/2016 "stopped drinking 4-5 months ago"  . Drug use: No  . Sexual activity: Not Currently   Other Topics Concern  . Not on file   Social  History Narrative  . No narrative on file    Physical Exam  Constitutional: He is oriented to person, place, and time. He appears well-developed.  Neck: Normal range of motion.  Cardiovascular: Normal rate and regular rhythm.   Pulmonary/Chest: Effort normal and breath sounds normal. No respiratory distress. He has no wheezes. He has no rales.  Abdominal: Soft. He exhibits no distension.  Musculoskeletal: Normal range of motion. He exhibits no edema.  Neurological: He is alert and oriented to person, place, and time.  Skin: Skin is warm and dry.        Future Appointments Date Time Provider [place]  09/06/2016 1:15 PM Zada Finders, MD [facility]  09/08/2016 8:30 AM  [facility]  09/11/2016 9:00 AM MC ECHO 1-BUZZ MC-ECHOLAB [facility]  09/11/2016 10:20 AM Larey Dresser, MD MC-[facility] None  09/14/2016 3:35 PM [facility] DEVICE REMOTES [facility]   BP 120/86 (BP Location: Right Arm, Patient Position: Sitting, Cuff Size: Normal)   Pulse 66   Wt 229 lb (103.9 kg)   SpO2 94%   BMI 33.33 kg/m  Weight yesterday- Did not weigh Last visit weight- 230 lbs  Travis Bryan was seen at home today and reports feeling well. He denied SOB, H/A, dizziness or orthopnea. His weight is down one pound from last week but he says he forgot to weigh all week. I advised that daily weights are important and he said he understood. He stated he is out of 100 mg hydralazine but had some 50 mg tablets that he was doubling up until he could pick up his 100 mg tablets. He said he had car trouble and had to pay to have it fixed which left him low on funds. He had plenty to use through next week and said he would be able to pat for his medications by then. All daily medications were verified and he reports taking them as prescribed. His pill box was refilled and we spoke about decreasing his visits from once a week to once every two weeks. He said that sounded good to him and said he knows how to fill his pillbox. I will contact the clinic and let them know about the change.  Jacquiline Doe, EMT 09/01/16  ACTION: Home visit completed Next visit planned for 2 weeks

## 2016-09-05 ENCOUNTER — Other Ambulatory Visit (HOSPITAL_COMMUNITY): Payer: BLUE CROSS/BLUE SHIELD

## 2016-09-06 ENCOUNTER — Ambulatory Visit (INDEPENDENT_AMBULATORY_CARE_PROVIDER_SITE_OTHER): Payer: BLUE CROSS/BLUE SHIELD | Admitting: Internal Medicine

## 2016-09-06 ENCOUNTER — Encounter: Payer: Self-pay | Admitting: Internal Medicine

## 2016-09-06 VITALS — BP 117/66 | HR 65 | Temp 98.4°F | Ht 70.0 in | Wt 235.3 lb

## 2016-09-06 DIAGNOSIS — Z79899 Other long term (current) drug therapy: Secondary | ICD-10-CM | POA: Diagnosis not present

## 2016-09-06 DIAGNOSIS — E1169 Type 2 diabetes mellitus with other specified complication: Secondary | ICD-10-CM

## 2016-09-06 DIAGNOSIS — Z95810 Presence of automatic (implantable) cardiac defibrillator: Secondary | ICD-10-CM

## 2016-09-06 DIAGNOSIS — N184 Chronic kidney disease, stage 4 (severe): Secondary | ICD-10-CM

## 2016-09-06 DIAGNOSIS — Z0000 Encounter for general adult medical examination without abnormal findings: Secondary | ICD-10-CM

## 2016-09-06 DIAGNOSIS — I4819 Other persistent atrial fibrillation: Secondary | ICD-10-CM

## 2016-09-06 DIAGNOSIS — E118 Type 2 diabetes mellitus with unspecified complications: Secondary | ICD-10-CM

## 2016-09-06 DIAGNOSIS — I5022 Chronic systolic (congestive) heart failure: Secondary | ICD-10-CM | POA: Diagnosis not present

## 2016-09-06 DIAGNOSIS — I481 Persistent atrial fibrillation: Secondary | ICD-10-CM | POA: Diagnosis not present

## 2016-09-06 DIAGNOSIS — Z7901 Long term (current) use of anticoagulants: Secondary | ICD-10-CM | POA: Diagnosis not present

## 2016-09-06 DIAGNOSIS — E1122 Type 2 diabetes mellitus with diabetic chronic kidney disease: Secondary | ICD-10-CM | POA: Diagnosis not present

## 2016-09-06 DIAGNOSIS — Z87891 Personal history of nicotine dependence: Secondary | ICD-10-CM | POA: Diagnosis not present

## 2016-09-06 DIAGNOSIS — Z794 Long term (current) use of insulin: Secondary | ICD-10-CM | POA: Diagnosis not present

## 2016-09-06 DIAGNOSIS — I130 Hypertensive heart and chronic kidney disease with heart failure and stage 1 through stage 4 chronic kidney disease, or unspecified chronic kidney disease: Secondary | ICD-10-CM

## 2016-09-06 DIAGNOSIS — E784 Other hyperlipidemia: Secondary | ICD-10-CM | POA: Diagnosis not present

## 2016-09-06 DIAGNOSIS — E785 Hyperlipidemia, unspecified: Secondary | ICD-10-CM

## 2016-09-06 DIAGNOSIS — I10 Essential (primary) hypertension: Secondary | ICD-10-CM

## 2016-09-06 LAB — POCT GLYCOSYLATED HEMOGLOBIN (HGB A1C): HEMOGLOBIN A1C: 6

## 2016-09-06 LAB — GLUCOSE, CAPILLARY: Glucose-Capillary: 114 mg/dL — ABNORMAL HIGH (ref 65–99)

## 2016-09-06 NOTE — Patient Instructions (Signed)
It was a pleasure to see you again Travis Bryan.  Your diabetes is under good control, keep up the good work!  Your Hemoglobin A1c is 6.0 today.  I have included information on the diabetes medications we talked about today.  Please follow up with the heart, kidney, and eye doctors as scheduled.  Please see me again in 3 months or sooner if needed.   Empagliflozin oral tablets What is this medicine? EMPAGLIFLOZIN (EM pa gli FLOE zin) helps to treat type 2 diabetes. It helps to control blood sugar. This drug may also reduce the risk of heart attack or stroke if you have type 2 diabetes and risk factors for heart disease. Treatment is combined with diet and exercise. This medicine may be used for other purposes; ask your health care provider or pharmacist if you have questions. COMMON BRAND NAME(S): JARDIANCE What should I tell my health care provider before I take this medicine? They need to know if you have any of these conditions: -dehydration -diabetic ketoacidosis -diet low in salt -eating less due to illness, surgery, dieting, or any other reason -having surgery -high cholesterol -high levels of potassium in the blood -history of pancreatitis or pancreas problems -history of yeast infection of the penis or vagina -if you often drink alcohol -infections in the bladder, kidneys, or urinary tract -kidney disease -liver disease -low blood pressure -on hemodialysis -problems urinating -type 1 diabetes -uncircumcised male -an unusual or allergic reaction to empagliflozin, other medicines, foods, dyes, or preservatives -pregnant or trying to get pregnant -breast-feeding How should I use this medicine? Take this medicine by mouth with a glass of water. Follow the directions on the prescription label. Take it in the morning, with or without food. Take your dose at the same time each day. Do not take more often than directed. Do not stop taking except on your doctor's advice. Talk to  your pediatrician regarding the use of this medicine in children. Special care may be needed. Overdosage: If you think you have taken too much of this medicine contact a poison control center or emergency room at once. NOTE: This medicine is only for you. Do not share this medicine with others. What if I miss a dose? If you miss a dose, take it as soon as you can. If it is almost time for your next dose, take only that dose. Do not take double or extra doses. What may interact with this medicine? Do not take this medicine with any of the following medications: -gatifloxacin This medicine may also interact with the following medications: -alcohol -certain medicines for blood pressure, heart disease -diuretics This list may not describe all possible interactions. Give your health care provider a list of all the medicines, herbs, non-prescription drugs, or dietary supplements you use. Also tell them if you smoke, drink alcohol, or use illegal drugs. Some items may interact with your medicine. What should I watch for while using this medicine? Visit your doctor or health care professional for regular checks on your progress. This medicine can cause a serious condition in which there is too much acid in the blood. If you develop nausea, vomiting, stomach pain, unusual tiredness, or breathing problems, stop taking this medicine and call your doctor right away. If possible, use a ketone dipstick to check for ketones in your urine. A test called the HbA1C (A1C) will be monitored. This is a simple blood test. It measures your blood sugar control over the last 2 to 3 months. You will  receive this test every 3 to 6 months. Learn how to check your blood sugar. Learn the symptoms of low and high blood sugar and how to manage them. Always carry a quick-source of sugar with you in case you have symptoms of low blood sugar. Examples include hard sugar candy or glucose tablets. Make sure others know that you can  choke if you eat or drink when you develop serious symptoms of low blood sugar, such as seizures or unconsciousness. They must get medical help at once. Tell your doctor or health care professional if you have high blood sugar. You might need to change the dose of your medicine. If you are sick or exercising more than usual, you might need to change the dose of your medicine. Do not skip meals. Ask your doctor or health care professional if you should avoid alcohol. Many nonprescription cough and cold products contain sugar or alcohol. These can affect blood sugar. Wear a medical ID bracelet or chain, and carry a card that describes your disease and details of your medicine and dosage times. What side effects may I notice from receiving this medicine? Side effects that you should report to your doctor or health care professional as soon as possible: -allergic reactions like skin rash, itching or hives, swelling of the face, lips, or tongue -breathing problems -dizziness -fast or irregular heartbeat -feeling faint or lightheaded, falls -muscle weakness -nausea, vomiting, unusual stomach upset or pain -signs and symptoms of low blood sugar such as feeling anxious, confusion, dizziness, increased hunger, unusually weak or tired, sweating, shakiness, cold, irritable, headache, blurred vision, fast heartbeat, loss of consciousness -signs and symptoms of a urinary tract infection, such as fever, chills, a burning feeling when urinating, blood in the urine, back pain -trouble passing urine or change in the amount of urine, including an urgent need to urinate more often, in larger amounts, or at night -penile discharge, itching, or pain in men -unusual tiredness -vaginal discharge, itching, or odor in women Side effects that usually do not require medical attention (report to your doctor or health care professional if they continue or are bothersome): -joint pain -mild increase in  urination -thirsty This list may not describe all possible side effects. Call your doctor for medical advice about side effects. You may report side effects to FDA at 1-800-FDA-1088. Where should I keep my medicine? Keep out of the reach of children. Store at room temperature between 20 and 25 degrees C (68 and 77 degrees F). Throw away any unused medicine after the expiration date. NOTE: This sheet is a summary. It may not cover all possible information. If you have questions about this medicine, talk to your doctor, pharmacist, or health care provider.  2018 Elsevier/Gold Standard (2015-04-01 11:46:10)   Sitagliptin oral tablet What is this medicine? SITAGLIPTIN (sit a GLIP tin) helps to treat type 2 diabetes. It helps to control blood sugar. Treatment is combined with diet and exercise. This medicine may be used for other purposes; ask your health care provider or pharmacist if you have questions. COMMON BRAND NAME(S): Januvia What should I tell my health care provider before I take this medicine? They need to know if you have any of these conditions: -diabetic ketoacidosis -kidney disease -pancreatitis -previous swelling of the tongue, face, or lips with difficulty breathing, difficulty swallowing, hoarseness, or tightening of the throat -type 1 diabetes -an unusual or allergic reaction to sitagliptin, other medicines, foods, dyes, or preservatives -pregnant or trying to get pregnant -breast-feeding How  should I use this medicine? Take this medicine by mouth with a glass of water. Follow the directions on the prescription label. You can take it with or without food. Do not cut, crush or chew this medicine. Take your dose at the same time each day. Do not take more often than directed. Do not stop taking except on your doctor's advice. A special MedGuide will be given to you by the pharmacist with each prescription and refill. Be sure to read this information carefully each time. Talk  to your pediatrician regarding the use of this medicine in children. Special care may be needed. Overdosage: If you think you have taken too much of this medicine contact a poison control center or emergency room at once. NOTE: This medicine is only for you. Do not share this medicine with others. What if I miss a dose? If you miss a dose, take it as soon as you can. If it is almost time for your next dose, take only that dose. Do not take double or extra doses. What may interact with this medicine? Do not take this medicine with any of the following medications: -gatifloxacin This medicine may also interact with the following medications: -alcohol -digoxin -insulin -sulfonylureas like glimepiride, glipizide, glyburide This list may not describe all possible interactions. Give your health care provider a list of all the medicines, herbs, non-prescription drugs, or dietary supplements you use. Also tell them if you smoke, drink alcohol, or use illegal drugs. Some items may interact with your medicine. What should I watch for while using this medicine? Visit your doctor or health care professional for regular checks on your progress. A test called the HbA1C (A1C) will be monitored. This is a simple blood test. It measures your blood sugar control over the last 2 to 3 months. You will receive this test every 3 to 6 months. Learn how to check your blood sugar. Learn the symptoms of low and high blood sugar and how to manage them. Always carry a quick-source of sugar with you in case you have symptoms of low blood sugar. Examples include hard sugar candy or glucose tablets. Make sure others know that you can choke if you eat or drink when you develop serious symptoms of low blood sugar, such as seizures or unconsciousness. They must get medical help at once. Tell your doctor or health care professional if you have high blood sugar. You might need to change the dose of your medicine. If you are sick or  exercising more than usual, you might need to change the dose of your medicine. Do not skip meals. Ask your doctor or health care professional if you should avoid alcohol. Many nonprescription cough and cold products contain sugar or alcohol. These can affect blood sugar. Wear a medical ID bracelet or chain, and carry a card that describes your disease and details of your medicine and dosage times. What side effects may I notice from receiving this medicine? Side effects that you should report to your doctor or health care professional as soon as possible: -allergic reactions like skin rash, itching or hives, swelling of the face, lips, or tongue -breathing problems -general ill feeling or flu-like symptoms -joint pain -loss of appetite -redness, blistering, peeling or loosening of the skin, including inside the mouth -signs and symptoms of low blood sugar such as feeling anxious, confusion, dizziness, increased hunger, unusually weak or tired, sweating, shakiness, cold, irritable, headache, blurred vision, fast heartbeat, loss of consciousness -unusual stomach pain or  discomfort -vomiting Side effects that usually do not require medical attention (report to your doctor or health care professional if they continue or are bothersome): -diarrhea -headache -sore throat -stomach upset -stuffy or runny nose This list may not describe all possible side effects. Call your doctor for medical advice about side effects. You may report side effects to FDA at 1-800-FDA-1088. Where should I keep my medicine? Keep out of the reach of children. Store at room temperature between 15 and 30 degrees C (59 and 86 degrees F). Throw away any unused medicine after the expiration date. NOTE: This sheet is a summary. It may not cover all possible information. If you have questions about this medicine, talk to your doctor, pharmacist, or health care provider.  2018 Elsevier/Gold Standard (2015-08-27 14:23:55)

## 2016-09-06 NOTE — Progress Notes (Signed)
CC: T2DM  HPI:  Travis Bryan is a 46 y.o. male with PMH as listed below including Chronic systolic CHF(EF 19% in 08/2227), NICM, Afib/Aflutter s/p ablation, ICD placement, HTN, CKD, T2DM, and HLD who presents for follow up management of his T2DM. Please see problem list for status of patient's chronic medical issues.  T2DM: Last A1c was 9.0 in March 2018. Patient is currently prescribed Lantus 25 units qhs and Novolog 6 units once daily with breakfast. He reports adherence. He says he checks his blood sugars at breakfast and dinner time. He does not check at lunch time because it is difficult while he is at work. He did not bring his meter this visit. He says his blood sugars have mostly ranged from 80-120. He does report a high in the 200s which was after he ate a meal. He denies feeling any hyperglycemic or hypoglycemic symptoms (he is on a beta blocker). We discussed adjusting his medications on last visit by transitioning to [place] or adding oral medications, however he did not want to make a change at that time.  Chronic Systolic CHF s/p ICD placement: Patient is following closely with the Heart Failure team (Dr. Aundra Dubin) and EP (Dr. Lovena Le). TTE on 04/02/2016 showed an EF of 20%, diffuse hypokinesis, grade 2 diastolic dysfunction. He is currently taking Torsemide 60 mg daily, Coreg 12.5 mg BID, Hydralazine 100 mg TID, Imdur 90 mg daily, and Ranexa 500 mg BID. He denies any chest pain, palpitations, shortness of breath, orthopnea, or peripheral edema. He occasionally checks his weight at home and reports a range from 229-232 lbs. He did not check his weight this morning. His weight in clinic is 235 lbs compared to 229 lbs 5 days prior.  HTN: Patient currently takes Amlodipine 5 mg daily in addition to his CHF medications listed above. BP is 117/66 this visit.  Atrial Fibrillation: He is s/p DCCV on 04/11/16 with return to NSR. He currently takes Amiodarone for rhythm control and is on  Coumadin for anticoagulation. He denies any obvious bleeding.  HLD: Patient is not currently on a statin. His current 10 year ASCVD risk is 11.4% with a lifetime risk of 69%.  CKD 3-4: GFR has been ranging between 20-35 mL/min since the beginning of this year. He has been referred to Nephrology and is now following with Dr. Lorrene Reid.  Healthcare Maintenance: Patient is eligible for the PPSV23 vaccine. He was referred to ophthalmology for annual diabetic retinopathy screening, however he had to reschedule his appointment.   Past Medical History:  Diagnosis Date  . AICD (automatic cardioverter/defibrillator) present   . Atrial flutter ([place])    a. s/p ablation 03/2016  . Chronic systolic CHF (congestive heart failure) ([place])   . History of gout   . Hyperlipidemia   . Hypertension   . Myocardial infarction [facility])    "I think I had one a long long time ago" (03/28/2016)  . NICM (nonischemic cardiomyopathy) ([place])    a. LHC 6/06: pLAD 20, pLCx 20-30; b. Echo 5/15:  EF 15%, diffuse HK, restrictive physiology, trivial AI, trivial MR, mild LAE, moderate RVE, moderately reduced RVSF, moderate RAE, mild to moderate TR, PASP 43 mmHg  . Obesity   . Persistent atrial fibrillation ([place])   . Type II diabetes mellitus ([place])     Review of Systems:   Review of Systems  Constitutional: Negative for chills, diaphoresis, fever and weight loss.  HENT: Negative for nosebleeds.   Respiratory: Negative for cough, hemoptysis, shortness of  breath and wheezing.   Cardiovascular: Negative for chest pain, palpitations, orthopnea, leg swelling and PND.  Gastrointestinal: Negative for blood in stool and melena.  Genitourinary: Negative for hematuria.  Endo/Heme/Allergies: Does not bruise/bleed easily.     Physical Exam:  Vitals:   09/06/16 1321  BP: 117/66  Pulse: 65  Temp: 98.4 F (36.9 C)  TempSrc: Oral  SpO2: 95%  Weight: 235 lb 4.8 oz (106.7 kg)  Height: 5\' 10"  (1.778 m)   Physical Exam    Constitutional: He is oriented to person, place, and time. He appears well-developed and well-nourished. No distress.  Cardiovascular: Normal rate and regular rhythm.   Pulmonary/Chest: Effort normal. No respiratory distress. He has no wheezes.  Bibasilar crackles  Musculoskeletal: He exhibits no edema or tenderness.  Neurological: He is alert and oriented to person, place, and time.  Skin: He is not diaphoretic.    Assessment & Plan:   See Encounters Tab for problem based charting.  Patient discussed with Dr. Dareen Piano  Type 2 diabetes mellitus ([place]) Patient's Hgb A1c this visit is 6.0. His diabetes is now well-controlled, however I am concerned he may be having hypoglycemia at home. Unfortunately he did not bring his meter so it is hard to pinpoint any variations in his glycemic control. He says he does notice his lower CBGs (80-90) in the morning before breakfast. He does eat a meal everytime he uses Novolog. He says he has been avoiding sugary beverages and is trying to eat healthier. I discussed transitioning him to Liraglutide or Sitagliptin and coming down on his insulin, however he wants to think about this. For now, we will come down on his Lantus and continue his Novolog with breakfast. - Decrease Lantus to 22 units qhs - Continue Novolog 6 units with breakfast only - Consider addition of Sitagliptin 25 mg daily (renal dosed) - Repeat A1c in 3 months - f/u with Ophthalmology - Consider Continuous Glucose Monitoring on follow up  Chronic systolic heart failure ([place]) Patient denies symptoms of orthopnea or dyspnea. He does not have peripheral edema but his weight is up and he has bibasilar crackles on lung auscultation. I have advised him to take one of his Metolazone and continue his other medications as prescribed. I also advised him to monitor his weights daily as well as fluid/salt restriction. He will follow up with cardiology on 09/11/16 for planned repeat TTE. - Take  Metolazone 2.5 mg once - Continue Torsemide 60 mg daily, Coreg 12.5 mg BID, Hydralazine 100 mg TID, Imdur 90 mg daily, and Ranexa 500 mg BID  Essential hypertension BP Readings from Last 3 Encounters:  09/06/16 117/66  09/01/16 120/86  08/25/16 136/84   Blood pressure is well-controlled on his current heart failure medications as well as Amlodipine 5 mg daily. - Continue Amlodipine 5 mg daily, Torsemide 60 mg daily, Coreg 12.5 mg BID, Hydralazine 100 mg TID, Imdur 90 mg daily  Persistent atrial fibrillation ([facility]) Rate is normal with regular rhythm on exam this visit. He denies any palpitations or obvious bleeding while on Coumadin. He reports adherence to Amiodarone 200 mg daily. - Continue current management  Hyperlipidemia associated with type 2 diabetes mellitus ([facility]) Lipid Panel     Component Value Date/Time   CHOL 243 (H) 09/06/2016 1415   TRIG 134 09/06/2016 1415   HDL 49 09/06/2016 1415   CHOLHDL 5.0 09/06/2016 1415   CHOLHDL 5.0 Ratio 04/13/2009 1945   VLDL 21 04/13/2009 1945   LDLCALC 167 (H) 09/06/2016 1415  Patient's current 10 year ASCVD risk is 11.4% with a lifetime risk of 69%. We will start high-intensity Atorvastatin 40 mg daily.  Chronic kidney disease, stage IV (severe) ([place]) Patient reports good urine output without dysuria or hematuria. Patient to follow up with Nephrology this week. Urine microalbumin/creatinine ratio this visit is 206.6 mg/g indicative of microalbuminuria without nephrotic range proteinuria. - f/u with Nephrology  Healthcare maintenance Patient declined the pneumonia vaccine this visit. He has rescheduled his Ophthalmology appointment for diabetic retinopathy screening and current Amiodarone use.

## 2016-09-07 LAB — LIPID PANEL
CHOLESTEROL TOTAL: 243 mg/dL — AB (ref 100–199)
Chol/HDL Ratio: 5 ratio (ref 0.0–5.0)
HDL: 49 mg/dL (ref >39)
LDL CALC: 167 mg/dL — AB (ref 0–99)
TRIGLYCERIDES: 134 mg/dL (ref 0–149)
VLDL CHOLESTEROL CAL: 27 mg/dL (ref 5–40)

## 2016-09-07 LAB — MICROALBUMIN / CREATININE URINE RATIO
Creatinine, Urine: 54.7 mg/dL
Microalb/Creat Ratio: 206.6 mg/g creat — ABNORMAL HIGH (ref 0.0–30.0)
Microalbumin, Urine: 113 ug/mL

## 2016-09-07 MED ORDER — ATORVASTATIN CALCIUM 40 MG PO TABS: 40.0000 mg | ORAL_TABLET | Freq: Every day | ORAL | 3 refills | Status: DC

## 2016-09-07 NOTE — Assessment & Plan Note (Addendum)
Patient denies symptoms of orthopnea or dyspnea. He does not have peripheral edema but his weight is up and he has bibasilar crackles on lung auscultation. I have advised him to take one of his Metolazone and continue his other medications as prescribed. I also advised him to monitor his weights daily as well as fluid/salt restriction. He will follow up with cardiology on 09/11/16 for planned repeat TTE. - Take Metolazone 2.5 mg once - Continue Torsemide 60 mg daily, Coreg 12.5 mg BID, Hydralazine 100 mg TID, Imdur 90 mg daily, and Ranexa 500 mg BID

## 2016-09-07 NOTE — Assessment & Plan Note (Signed)
Patient declined the pneumonia vaccine this visit. He has rescheduled his Ophthalmology appointment for diabetic retinopathy screening and current Amiodarone use.

## 2016-09-07 NOTE — Assessment & Plan Note (Signed)
Lipid Panel     Component Value Date/Time   CHOL 243 (H) 09/06/2016 1415   TRIG 134 09/06/2016 1415   HDL 49 09/06/2016 1415   CHOLHDL 5.0 09/06/2016 1415   CHOLHDL 5.0 Ratio 04/13/2009 1945   VLDL 21 04/13/2009 1945   LDLCALC 167 (H) 09/06/2016 1415   Patient's current 10 year ASCVD risk is 11.4% with a lifetime risk of 69%. We will start high-intensity Atorvastatin 40 mg daily.

## 2016-09-07 NOTE — Assessment & Plan Note (Signed)
Patient reports good urine output without dysuria or hematuria. Patient to follow up with Nephrology this week. Urine microalbumin/creatinine ratio this visit is 206.6 mg/g indicative of microalbuminuria without nephrotic range proteinuria. - f/u with Nephrology

## 2016-09-07 NOTE — Assessment & Plan Note (Signed)
Rate is normal with regular rhythm on exam this visit. He denies any palpitations or obvious bleeding while on Coumadin. He reports adherence to Amiodarone 200 mg daily. - Continue current management

## 2016-09-07 NOTE — Progress Notes (Signed)
Internal Medicine Clinic Attending  Case discussed with Dr. Patel,Vishal at the time of the visit.  We reviewed the resident's history and exam and pertinent patient test results.  I agree with the assessment, diagnosis, and plan of care documented in the resident's note.  

## 2016-09-07 NOTE — Assessment & Plan Note (Addendum)
Patient's Hgb A1c this visit is 6.0. His diabetes is now well-controlled, however I am concerned he may be having hypoglycemia at home. Unfortunately he did not bring his meter so it is hard to pinpoint any variations in his glycemic control. He says he does notice his lower CBGs (80-90) in the morning before breakfast. He does eat a meal everytime he uses Novolog. He says he has been avoiding sugary beverages and is trying to eat healthier. I discussed transitioning him to Liraglutide or Sitagliptin and coming down on his insulin, however he wants to think about this. For now, we will come down on his Lantus and continue his Novolog with breakfast. - Decrease Lantus to 22 units qhs - Continue Novolog 6 units with breakfast only - Consider addition of Sitagliptin 25 mg daily (renal dosed) - Repeat A1c in 3 months - f/u with Ophthalmology - Consider Continuous Glucose Monitoring on follow up

## 2016-09-07 NOTE — Assessment & Plan Note (Signed)
BP Readings from Last 3 Encounters:  09/06/16 117/66  09/01/16 120/86  08/25/16 136/84   Blood pressure is well-controlled on his current heart failure medications as well as Amlodipine 5 mg daily. - Continue Amlodipine 5 mg daily, Torsemide 60 mg daily, Coreg 12.5 mg BID, Hydralazine 100 mg TID, Imdur 90 mg daily

## 2016-09-08 ENCOUNTER — Ambulatory Visit (INDEPENDENT_AMBULATORY_CARE_PROVIDER_SITE_OTHER): Payer: BLUE CROSS/BLUE SHIELD | Admitting: *Deleted

## 2016-09-08 DIAGNOSIS — Z95810 Presence of automatic (implantable) cardiac defibrillator: Secondary | ICD-10-CM | POA: Diagnosis not present

## 2016-09-08 DIAGNOSIS — Z5181 Encounter for therapeutic drug level monitoring: Secondary | ICD-10-CM | POA: Diagnosis not present

## 2016-09-08 DIAGNOSIS — I481 Persistent atrial fibrillation: Secondary | ICD-10-CM

## 2016-09-08 DIAGNOSIS — I4819 Other persistent atrial fibrillation: Secondary | ICD-10-CM

## 2016-09-08 DIAGNOSIS — I483 Typical atrial flutter: Secondary | ICD-10-CM | POA: Diagnosis not present

## 2016-09-08 LAB — POCT INR: INR: 2.3

## 2016-09-08 MED FILL — metOLazone 2.5 MG TABS: 2.5 | 5 days supply | Qty: 5 | Fill #0

## 2016-09-11 ENCOUNTER — Other Ambulatory Visit (HOSPITAL_COMMUNITY): Payer: Self-pay

## 2016-09-11 ENCOUNTER — Encounter (HOSPITAL_COMMUNITY): Payer: Self-pay | Admitting: Cardiology

## 2016-09-11 ENCOUNTER — Ambulatory Visit (HOSPITAL_COMMUNITY)
Admission: RE | Admit: 2016-09-11 | Discharge: 2016-09-11 | Disposition: A | Discharge status: Home / Self Care | Payer: BLUE CROSS/BLUE SHIELD | Source: Ambulatory Visit | Attending: Cardiology | Admitting: Cardiology

## 2016-09-11 ENCOUNTER — Ambulatory Visit (HOSPITAL_BASED_OUTPATIENT_CLINIC_OR_DEPARTMENT_OTHER)
Admission: RE | Admit: 2016-09-11 | Discharge: 2016-09-11 | Disposition: A | Discharge status: Home / Self Care | Payer: BLUE CROSS/BLUE SHIELD | Source: Ambulatory Visit | Attending: Cardiology | Admitting: Cardiology

## 2016-09-11 VITALS — BP 151/98 | HR 65 | Wt 230.0 lb

## 2016-09-11 DIAGNOSIS — I5022 Chronic systolic (congestive) heart failure: Secondary | ICD-10-CM

## 2016-09-11 DIAGNOSIS — I454 Nonspecific intraventricular block: Secondary | ICD-10-CM | POA: Insufficient documentation

## 2016-09-11 DIAGNOSIS — E1169 Type 2 diabetes mellitus with other specified complication: Secondary | ICD-10-CM | POA: Diagnosis not present

## 2016-09-11 DIAGNOSIS — Z7901 Long term (current) use of anticoagulants: Secondary | ICD-10-CM | POA: Diagnosis not present

## 2016-09-11 DIAGNOSIS — Z87891 Personal history of nicotine dependence: Secondary | ICD-10-CM | POA: Diagnosis not present

## 2016-09-11 DIAGNOSIS — Z95810 Presence of automatic (implantable) cardiac defibrillator: Secondary | ICD-10-CM | POA: Insufficient documentation

## 2016-09-11 DIAGNOSIS — E785 Hyperlipidemia, unspecified: Secondary | ICD-10-CM | POA: Diagnosis not present

## 2016-09-11 DIAGNOSIS — I130 Hypertensive heart and chronic kidney disease with heart failure and stage 1 through stage 4 chronic kidney disease, or unspecified chronic kidney disease: Secondary | ICD-10-CM | POA: Diagnosis present

## 2016-09-11 DIAGNOSIS — Z79899 Other long term (current) drug therapy: Secondary | ICD-10-CM | POA: Insufficient documentation

## 2016-09-11 DIAGNOSIS — I428 Other cardiomyopathies: Secondary | ICD-10-CM | POA: Diagnosis not present

## 2016-09-11 DIAGNOSIS — Z794 Long term (current) use of insulin: Secondary | ICD-10-CM | POA: Insufficient documentation

## 2016-09-11 DIAGNOSIS — R001 Bradycardia, unspecified: Secondary | ICD-10-CM | POA: Insufficient documentation

## 2016-09-11 DIAGNOSIS — I2510 Atherosclerotic heart disease of native coronary artery without angina pectoris: Secondary | ICD-10-CM | POA: Diagnosis not present

## 2016-09-11 DIAGNOSIS — I480 Paroxysmal atrial fibrillation: Secondary | ICD-10-CM | POA: Insufficient documentation

## 2016-09-11 DIAGNOSIS — I252 Old myocardial infarction: Secondary | ICD-10-CM | POA: Insufficient documentation

## 2016-09-11 DIAGNOSIS — I509 Heart failure, unspecified: Secondary | ICD-10-CM | POA: Insufficient documentation

## 2016-09-11 DIAGNOSIS — N184 Chronic kidney disease, stage 4 (severe): Secondary | ICD-10-CM | POA: Insufficient documentation

## 2016-09-11 DIAGNOSIS — I10 Essential (primary) hypertension: Secondary | ICD-10-CM | POA: Diagnosis not present

## 2016-09-11 DIAGNOSIS — E1122 Type 2 diabetes mellitus with diabetic chronic kidney disease: Secondary | ICD-10-CM | POA: Insufficient documentation

## 2016-09-11 DIAGNOSIS — I083 Combined rheumatic disorders of mitral, aortic and tricuspid valves: Secondary | ICD-10-CM | POA: Diagnosis not present

## 2016-09-11 DIAGNOSIS — I440 Atrioventricular block, first degree: Secondary | ICD-10-CM | POA: Diagnosis not present

## 2016-09-11 LAB — COMPREHENSIVE METABOLIC PANEL
ALK PHOS: 159 U/L — AB (ref 38–126)
ALT: 25 U/L (ref 17–63)
ANION GAP: 11 (ref 5–15)
AST: 29 U/L (ref 15–41)
Albumin: 3.8 g/dL (ref 3.5–5.0)
BUN: 27 mg/dL — ABNORMAL HIGH (ref 6–20)
CALCIUM: 9.4 mg/dL (ref 8.9–10.3)
CO2: 28 mmol/L (ref 22–32)
CREATININE: 2.72 mg/dL — AB (ref 0.61–1.24)
Chloride: 99 mmol/L — ABNORMAL LOW (ref 101–111)
GFR, EST AFRICAN AMERICAN: 31 mL/min — AB (ref >60)
GFR, EST NON AFRICAN AMERICAN: 27 mL/min — AB (ref >60)
Glucose, Bld: 127 mg/dL — ABNORMAL HIGH (ref 65–99)
Potassium: 3 mmol/L — ABNORMAL LOW (ref 3.5–5.1)
Sodium: 138 mmol/L (ref 135–145)
TOTAL PROTEIN: 8.3 g/dL — AB (ref 6.5–8.1)
Total Bilirubin: 1.2 mg/dL (ref 0.3–1.2)

## 2016-09-11 LAB — TSH: TSH: 4.823 u[IU]/mL — AB (ref 0.350–4.500)

## 2016-09-11 MED ORDER — TORSEMIDE 20 MG PO TABS: ORAL_TABLET | ORAL | 3 refills | Status: DC

## 2016-09-11 MED ORDER — ATORVASTATIN CALCIUM 40 MG PO TABS: 40.0000 mg | ORAL_TABLET | Freq: Every day | ORAL | 3 refills | Status: DC

## 2016-09-11 MED ORDER — CARVEDILOL 12.5 MG PO TABS: 18.7500 mg | ORAL_TABLET | Freq: Two times a day (BID) | ORAL | 6 refills | Status: DC

## 2016-09-11 MED FILL — ATORVASTATIN 40 MG TABLET: 40 | 90 days supply | Qty: 90 | Fill #0

## 2016-09-11 MED FILL — CARVEDILOL 12.5 MG TABLET: 12.5 | 30 days supply | Qty: 90 | Fill #0

## 2016-09-11 MED FILL — TORSEMIDE 20 MG TABLET: 20 | 30 days supply | Qty: 105 | Fill #0

## 2016-09-11 NOTE — Patient Instructions (Signed)
Increase Carvedilol 18.75 mg (1 & 1/2 tabs) Twice daily   Increase Torsemide to 60 mg (3 tabs) every other day ALTERNATING with 80 mg (4 tabs) every other day   Labs today  Labs in 2 weeks  Your physician recommends that you schedule a follow-up appointment in: 6 weeks

## 2016-09-11 NOTE — Progress Notes (Signed)
Paramedicine Encounter   Patient ID: Travis Bryan , male,   DOB: Jun 07, 1970,45 y.o.,  MRN: [id number]   Met patient in clinic today with provider.  Time spent with patient 40 minutes  Was started on 40 mg atorvastatin once per day on 09-07-16 per internal medicine. Dr. Aundra Dubin is interested in upgrading to a biventricular defibrillator. Dr Aundra Dubin is increasing torsemide from 60 mg to 80 mg every other morning.   Jacquiline Doe, EMT-Paramedic 09/11/2016   ACTION: Next visit planned for 2 weeks

## 2016-09-11 NOTE — Progress Notes (Signed)
  Echocardiogram 2D Echocardiogram has been performed.  Travis Bryan M 09/11/2016, 9:25 AM

## 2016-09-11 NOTE — Progress Notes (Signed)
PCP: Dr. Posey Pronto HF Cardiology: Dr. Aundra Dubin EP: Dr. Lovena Le Nephrology: Dr Lorrene Reid   46 yo with history of poorly controlled DM, atrial flutter and fibrillation, nonischemic cardiomyopathy, and CKD presents for cardiology followup.  Cardiomyopathy has been known for a number of years. Cath in 6/06 showed no obstructive coronary disease.  He had ICD discharge for VT in 10/17 and again in 1/18, both times likely in the setting of CHF exacerbation.    He was admitted in 1/18 with exertional dyspnea/CHF exacerbation.  ICD had also discharged for VT.  He was noted to be in new atrial flutter.  He had a flutter ablation.  However, post-procedure, he developed AKI and cardiogenic shock.  Dobutamine and norepinephrine were required but eventually titrated off.  He developed atrial fibrillation subsequent to the atrial flutter ablation.  He had DCCV to NSR 04/11/16.  He is on amiodarone and ranolazine. He is in NSR today.   Echo today was reviewed, EF 25-30%, moderate dilation, moderate LVH, mild MR, moderately decreased RV systolic function, PASP 51 mmHg.   Today he returns for HF follow up. He continues to feel quite good.  Working full time in a warehouse. Able to do his job and walk around generally without dyspnea. No chest pain.  No orthopnea/PND. Taking all medications. Followed by Paramedicine.  BP remains elevated.     Labs (2/18): hgb 14.6, K 4.9, creatinine 3.43 Labs 04/26/2016: K 4.1 Creatinine 2.79, TSH normal, LFTs normal, hgb 14.4 Labs (4/18): K 3.7, creatinine 2.61 Labs (6/18): LDL 167, HDL 49, hgb A1c 6  ECG (personally reviewed): NSR, 1st degree AVB, IVCD 136 msec  PMH: 1. Chronic systolic CHF: Nonischemic cardiomyopathy.  St Jude ICD.  - LHC (6/06): Nonobstructive CAD.  - Echo (1/18): EF 20%, moderate RV dilation with moderate-severely decreased RV systolic function, PASP 48 mmHg.  - Cardiolite (1/18): EF 7%, large fixed inferolateral defect no ischemia.  2. Atrial flutter: s/p ablation  1/18.  3. Atrial fibrillation: Paroxysmal.  S/p DCCV 1/18.  4. CKD: Stage IV. 5. Type II diabetes: Poorly controlled over time.  6. VT: 10/17 and 1/18 with ICD discharge.    Social History   Social History  . Marital status: Widowed    Spouse name: N/A  . Number of children: N/A  . Years of education: N/A   Social History Main Topics  . Smoking status: Former Smoker    Years: 10.00    Types: Cigars    Quit date: 07/28/2002  . Smokeless tobacco: Never Used  . Alcohol use No     Comment: 03/28/2016 "stopped drinking 4-5 months ago"  . Drug use: No  . Sexual activity: Not Currently   Other Topics Concern  . None   Social History Narrative  . None   Family History  Problem Relation Age of Onset  . Hypertension Mother   . Heart disease Mother   . Diabetes Mother    ROS: All systems reviewed and negative except as per HPI.   Current Outpatient Prescriptions  Medication Sig Dispense Refill  . amiodarone (PACERONE) 200 MG tablet Take 200 mg by mouth daily.    Marland Kitchen amLODipine (NORVASC) 5 MG tablet Take 1 tablet (5 mg total) by mouth daily. 30 tablet 6  . atorvastatin (LIPITOR) 40 MG tablet Take 1 tablet (40 mg total) by mouth daily. 90 tablet 3  . carvedilol (COREG) 12.5 MG tablet Take 1.5 tablets (18.75 mg total) by mouth 2 (two) times daily with a meal. 90  tablet 6  . hydrALAZINE (APRESOLINE) 100 MG tablet TAKE 1 TABLET BY MOUTH 3 TIMES DAILY. 90 tablet 2  . insulin aspart (NOVOLOG) 100 UNIT/ML injection Inject 6 Units into the skin daily with breakfast. 10 mL 11  . insulin glargine (LANTUS) 100 UNIT/ML injection Inject 0.25 mLs (25 Units total) into the skin at bedtime. 10 mL 11  . Insulin Syringe-Needle U-100 31G X 15/64" 0.3 ML MISC Use to inject insulin 4 times a day 200 each 12  . isosorbide mononitrate (IMDUR) 30 MG 24 hr tablet Take 3 tablets (90 mg total) by mouth daily. 270 tablet 3  . magnesium oxide (MAG-OX) 400 (241.3 Mg) MG tablet Take 1 tablet (400 mg total) by  mouth daily. 30 tablet 6  . metolazone (ZAROXOLYN) 2.5 MG tablet Take 1 tablet (2.5 mg total) by mouth as needed. Take as directed 5 tablet 0  . potassium chloride SA (K-DUR,KLOR-CON) 20 MEQ tablet Take 2 tablets (40 mEq total) by mouth daily. 60 tablet 3  . ranolazine (RANEXA) 500 MG 12 hr tablet Take 1 tablet (500 mg total) by mouth 2 (two) times daily. 60 tablet 6  . torsemide (DEMADEX) 20 MG tablet Take 3 tabs every other day ALTERNATING with 4 tabs every other day 120 tablet 3  . warfarin (COUMADIN) 5 MG tablet Take As Directed by [facility] 45 tablet 2   No current facility-administered medications for this encounter.    BP (!) 151/98   Pulse 65   Wt 230 lb (104.3 kg)   SpO2 98%   BMI 33.00 kg/m  General: NAD Neck: JVP 10-11 cm with HJR, no thyromegaly or thyroid nodule.  Lungs: Clear bilaterally CV: Nondisplaced PMI.  Heart regular S1/S2, no S3/S4, no murmur.  No peripheral edema.  No carotid bruit.  Normal pedal pulses.  Abdomen: Soft, nontender, no hepatosplenomegaly, no distention.  Skin: Intact without lesions or rashes.  Neurologic: Alert and oriented x 3.  Psych: Normal affect. Extremities: No clubbing or cyanosis.  HEENT: Normal.   Assessment/Plan: 1. Chronic systolic CHF: Nonischemic cardiomyopathy.  St Jude ICD.  Echo 1/18 with EF 20% and moderate to severely decreased RV systolic function.  Echo 6/18 with EF 25-30%, moderate dilation, moderate LVH, mild MR, moderately decreased RV systolic function, PASP 51 mmHg. NYHA class II symptoms.  Mild volume overload by exam though symptoms minimal.  - Increase torsemide to 80 mg daily alternating with 60 mg daily (he is currently taking 60 mg daily), BMET today and in 2 wks. .     - Increase Coreg to 18.75 mg bid.  - Continue hydralazine 100 mg tid and continue Imdur 90 daily.  - No ACEI/ARB/ARNI or spironolactone with CKD.  - He had a wide IVCD when last checked prior to today, and I had discussed with Dr. Lovena Le  regarding CRT upgrade.  However, today IVCD is only 136 msec. I am not sure that he will get much benefit from CRT.  2. Atrial fibrillation: Paroxysmal.  He is in NSR today.   - Continue amiodarone.  LFTs and TSH to be checked today.  He will need a regular eye exam.  - Continue warfarin.   3. HTN: BP elevated today, increasing Coreg as above.   4. Diabetes: Last hgbA1c was 6.  5. VT: Now on amiodarone.  6. CKD: Stage IV.  Seen by nephrology.  BMET today.   Loralie Champagne 09/11/2016

## 2016-09-14 ENCOUNTER — Telehealth: Payer: Self-pay | Admitting: Cardiology

## 2016-09-14 ENCOUNTER — Ambulatory Visit (INDEPENDENT_AMBULATORY_CARE_PROVIDER_SITE_OTHER): Payer: BLUE CROSS/BLUE SHIELD | Admitting: *Deleted

## 2016-09-14 ENCOUNTER — Telehealth (HOSPITAL_COMMUNITY): Payer: Self-pay | Admitting: *Deleted

## 2016-09-14 DIAGNOSIS — R7989 Other specified abnormal findings of blood chemistry: Secondary | ICD-10-CM

## 2016-09-14 DIAGNOSIS — I428 Other cardiomyopathies: Secondary | ICD-10-CM

## 2016-09-14 DIAGNOSIS — I5022 Chronic systolic (congestive) heart failure: Secondary | ICD-10-CM

## 2016-09-14 MED ORDER — POTASSIUM CHLORIDE CRYS ER 20 MEQ PO TBCR
EXTENDED_RELEASE_TABLET | ORAL | 3 refills | Status: DC
Start: 2016-09-14 — End: 2017-01-16

## 2016-09-14 MED ORDER — ISOSORBIDE MONONITRATE ER 30 MG PO TB24: 90.0000 mg | ORAL_TABLET | Freq: Every day | ORAL | 3 refills | Status: DC

## 2016-09-14 MED ORDER — RANOLAZINE ER 500 MG PO TB12: 500.0000 mg | ORAL_TABLET | Freq: Two times a day (BID) | ORAL | 6 refills | Status: DC

## 2016-09-14 MED ORDER — AMLODIPINE BESYLATE 5 MG PO TABS: 5.0000 mg | ORAL_TABLET | Freq: Every day | ORAL | 6 refills | Status: DC

## 2016-09-14 MED FILL — ISOSORBIDE MN ER 30 MG TAB: 30 | 90 days supply | Qty: 270 | Fill #0

## 2016-09-14 MED FILL — AMIODARONE HCL 200 MG TAB: 200 | 30 days supply | Qty: 30 | Fill #3

## 2016-09-14 MED FILL — RANEXA ER 500 MG TABLET: 500 | 30 days supply | Qty: 60 | Fill #5

## 2016-09-14 MED FILL — WARFARIN SODIUM 5 MG TABLET: 5 | 30 days supply | Qty: 45 | Fill #1

## 2016-09-14 MED FILL — AMLODIPINE BESYLATE 5 MG TA: 5 | 30 days supply | Qty: 30 | Fill #0

## 2016-09-14 MED FILL — POTASSIUM CL ER 20 MEQ TABL: 20 | 30 days supply | Qty: 90 | Fill #0

## 2016-09-14 NOTE — Telephone Encounter (Signed)
Notes recorded by Scarlette Calico, RN on 09/14/2016 at 10:26 AM EDT Pt aware, agreeable and verbalizes understanding, he is sch for bmet 7/16, resch to 7/12 and added thyroid panel ------  Notes recorded by Kennieth Rad, RN on 09/12/2016 at 4:46 PM EDT Second message left on mobile phone, tried calling home number and no answer with no VM to leave message ------  Notes recorded by Kennieth Rad, RN on 09/12/2016 at 4:37 PM EDT Called and left message for patient to call us back.

## 2016-09-14 NOTE — Telephone Encounter (Signed)
Spoke with pt and reminded pt of remote transmission that is due today. Pt verbalized understanding.   

## 2016-09-14 NOTE — Telephone Encounter (Signed)
-----   Message from Larey Dresser, MD sent at 09/12/2016  4:28 PM EDT ----- K is low, have to be careful with elevated creatinine.  Would take KCl 40 mEq bid x 1 day, then KCl 40 qam/20 qpm after that.  TSH mildly elevated, at followup will need to check TSH, free T3, free T4.

## 2016-09-15 NOTE — Progress Notes (Signed)
Remote ICD transmission.   

## 2016-09-22 ENCOUNTER — Telehealth (HOSPITAL_COMMUNITY): Payer: Self-pay | Admitting: *Deleted

## 2016-09-22 ENCOUNTER — Other Ambulatory Visit (HOSPITAL_COMMUNITY): Payer: Self-pay

## 2016-09-22 ENCOUNTER — Encounter: Payer: Self-pay | Admitting: Cardiology

## 2016-09-22 NOTE — Progress Notes (Signed)
Paramedicine Encounter    Patient ID: Travis Bryan, male    DOB: [birth year], 46 y.o.   MRN: [id number]   Patient Care Team: Zada Finders, MD as PCP - General (Internal Medicine)  Patient Active Problem List   Diagnosis Date Noted  . Healthcare maintenance 06/24/2016  . Persistent atrial fibrillation ([place])   . Chronic kidney disease, stage IV (severe) ([place])   . Typical atrial flutter ([place])   . CARDIOMYOPATHY, SECONDARY 07/03/2008  . Chronic systolic heart failure ([place]) 07/03/2008  . Automatic implantable cardioverter-defibrillator in situ 07/03/2008  . Type 2 diabetes mellitus ([place]) 12/26/2005  . Hyperlipidemia associated with type 2 diabetes mellitus ([place]) 12/26/2005  . Essential hypertension 12/26/2005    Current Outpatient Prescriptions:  .  amiodarone (PACERONE) 200 MG tablet, Take 200 mg by mouth daily., Disp: , Rfl:  .  amLODipine (NORVASC) 5 MG tablet, Take 1 tablet (5 mg total) by mouth daily., Disp: 30 tablet, Rfl: 6 .  atorvastatin (LIPITOR) 40 MG tablet, Take 1 tablet (40 mg total) by mouth daily., Disp: 90 tablet, Rfl: 3 .  carvedilol (COREG) 12.5 MG tablet, Take 1.5 tablets (18.75 mg total) by mouth 2 (two) times daily with a meal., Disp: 90 tablet, Rfl: 6 .  hydrALAZINE (APRESOLINE) 100 MG tablet, TAKE 1 TABLET BY MOUTH 3 TIMES DAILY., Disp: 90 tablet, Rfl: 2 .  insulin aspart (NOVOLOG) 100 UNIT/ML injection, Inject 6 Units into the skin daily with breakfast., Disp: 10 mL, Rfl: 11 .  insulin glargine (LANTUS) 100 UNIT/ML injection, Inject 0.25 mLs (25 Units total) into the skin at bedtime., Disp: 10 mL, Rfl: 11 .  Insulin Syringe-Needle U-100 31G X 15/64" 0.3 ML MISC, Use to inject insulin 4 times a day, Disp: 200 each, Rfl: 12 .  isosorbide mononitrate (IMDUR) 30 MG 24 hr tablet, Take 3 tablets (90 mg total) by mouth daily., Disp: 270 tablet, Rfl: 3 .  magnesium oxide (MAG-OX) 400 (241.3 Mg) MG tablet, Take 1 tablet (400 mg total) by mouth daily., Disp: 30 tablet, Rfl:  6 .  metolazone (ZAROXOLYN) 2.5 MG tablet, Take 1 tablet (2.5 mg total) by mouth as needed. Take as directed, Disp: 5 tablet, Rfl: 0 .  potassium chloride SA (K-DUR,KLOR-CON) 20 MEQ tablet, Take 2 tabs in AM and 1 tab in PM, Disp: 90 tablet, Rfl: 3 .  ranolazine (RANEXA) 500 MG 12 hr tablet, Take 1 tablet (500 mg total) by mouth 2 (two) times daily., Disp: 60 tablet, Rfl: 6 .  torsemide (DEMADEX) 20 MG tablet, Take 3 tabs every other day ALTERNATING with 4 tabs every other day, Disp: 120 tablet, Rfl: 3 .  warfarin (COUMADIN) 5 MG tablet, Take As Directed by [facility]mad[facility] Clinic, Disp: 45 tablet, Rfl: 2 Allergies  Allergen Reactions  . No Known Allergies      Social History   Social History  . Marital status: Widowed    Spouse name: N/A  . Number of children: N/A  . Years of education: N/A   Occupational History  . Not on file.   Social History Main Topics  . Smoking status: Former Smoker    Years: 10.00    Types: Cigars    Quit date: 07/28/2002  . Smokeless tobacco: Never Used  . Alcohol use No     Comment: 03/28/2016 "stopped drinking 4-5 months ago"  . Drug use: No  . Sexual activity: Not Currently   Other Topics Concern  . Not on file   Social History Narrative  . No  narrative on file    Physical Exam  Constitutional: He is oriented to person, place, and time. He appears well-developed.  Neck: Normal range of motion. JVD present.  Cardiovascular: Normal rate and regular rhythm.   Pulmonary/Chest: Effort normal. No respiratory distress. He has no wheezes. He has rales.  Abdominal: Soft. He exhibits no distension. There is no tenderness.  Musculoskeletal: Normal range of motion. He exhibits no edema.  Neurological: He is alert and oriented to person, place, and time.  Skin: Skin is warm and dry.  Psychiatric: He has a normal mood and affect.        Future Appointments Date Time Provider [place]  09/25/2016 10:30 AM [facility] LAB [facility] None  10/06/2016  9:00 AM [facility]  10/23/2016 10:00 AM [facility] PA/NP [facility] None  12/06/2016 1:15 PM Zada Finders, MD [facility]  12/14/2016 12:20 PM [facility] DEVICE REMOTES [facility]   BP (!) 170/100 (BP Location: Left Arm, Patient Position: Sitting, Cuff Size: Normal)   Pulse 72   Resp 18   Wt 236 lb 3.2 oz (107.1 kg)   SpO2 (!) 88%   BMI 33.89 kg/m  Weight yesterday- did not weigh Last visit weight- 230 lbs CBG- 112 mg/dl  Mr Lebleu was seen at home today and reports feeling well. He denied SOB, H/A or dizziness. He stated he has not been weighing himself because he has been tired from working so much this week. He stated he has been taking his medications and following an appropriate diet as well as monitoring his fluid intake. He attributes his weight gain since the last visit to eating and then going to bed, not fluid. Upon obtaining vital signs, he was found to be hypertensive and hypoxic with rales in his lower lung fields. No extremity edema was noted and his abdomen was soft and non-tender. The clinic was contacted and Susie advised to have Mr Tatar increase to 80 mg of torsemide every morning for the next 3 days, in accordance with Andy's order. The change was made in Mr Bell' pillbox and he was advised to record his weights daily. I will follow up on Monday.  Jacquiline Doe, EMT 09/22/16  ACTION: Home visit completed Next visit planned for 1 week

## 2016-09-22 NOTE — Telephone Encounter (Signed)
Travis Bryan with paramed called and reported patient's weight is up 6 lbs since July 2nd, increased shortness of breath, BP is 170/100, and rales in lower lobes.  I spoke with Oda Kilts, PA and he advises patient to take 4 torsemide for the next few days and have Travis Bryan check back with [place] next week on his symptoms.   Thedore Mins is aware and will call us next week.

## 2016-09-25 ENCOUNTER — Other Ambulatory Visit (HOSPITAL_COMMUNITY): Payer: BLUE CROSS/BLUE SHIELD

## 2016-09-25 ENCOUNTER — Other Ambulatory Visit (HOSPITAL_COMMUNITY): Payer: Self-pay

## 2016-09-25 ENCOUNTER — Ambulatory Visit (HOSPITAL_COMMUNITY)
Admission: RE | Admit: 2016-09-25 | Discharge: 2016-09-25 | Disposition: A | Discharge status: Home / Self Care | Payer: BLUE CROSS/BLUE SHIELD | Source: Ambulatory Visit | Attending: Cardiology | Admitting: Cardiology

## 2016-09-25 DIAGNOSIS — I5022 Chronic systolic (congestive) heart failure: Secondary | ICD-10-CM | POA: Diagnosis not present

## 2016-09-25 DIAGNOSIS — E0789 Other specified disorders of thyroid: Secondary | ICD-10-CM | POA: Insufficient documentation

## 2016-09-25 DIAGNOSIS — R7989 Other specified abnormal findings of blood chemistry: Secondary | ICD-10-CM

## 2016-09-25 LAB — BASIC METABOLIC PANEL
Anion gap: 9 (ref 5–15)
BUN: 41 mg/dL — AB (ref 6–20)
CALCIUM: 9.3 mg/dL (ref 8.9–10.3)
CHLORIDE: 99 mmol/L — AB (ref 101–111)
CO2: 30 mmol/L (ref 22–32)
CREATININE: 3.61 mg/dL — AB (ref 0.61–1.24)
GFR calc Af Amer: 22 mL/min — ABNORMAL LOW (ref >60)
GFR calc non Af Amer: 19 mL/min — ABNORMAL LOW (ref >60)
GLUCOSE: 112 mg/dL — AB (ref 65–99)
Potassium: 3.7 mmol/L (ref 3.5–5.1)
Sodium: 138 mmol/L (ref 135–145)

## 2016-09-25 LAB — T4, FREE: FREE T4: 1.33 ng/dL — AB (ref 0.61–1.12)

## 2016-09-25 LAB — TSH: TSH: 4.66 u[IU]/mL — ABNORMAL HIGH (ref 0.350–4.500)

## 2016-09-25 NOTE — Progress Notes (Signed)
Paramedicine Encounter    Patient ID: Travis Bryan, male    DOB: [birth year], 46 y.o.   MRN: [id number]   Patient Care Team: Zada Finders, MD as PCP - General (Internal Medicine)  Patient Active Problem List   Diagnosis Date Noted  . Healthcare maintenance 06/24/2016  . Persistent atrial fibrillation ([place])   . Chronic kidney disease, stage IV (severe) ([place])   . Typical atrial flutter ([place])   . CARDIOMYOPATHY, SECONDARY 07/03/2008  . Chronic systolic heart failure ([place]) 07/03/2008  . Automatic implantable cardioverter-defibrillator in situ 07/03/2008  . Type 2 diabetes mellitus ([place]) 12/26/2005  . Hyperlipidemia associated with type 2 diabetes mellitus ([place]) 12/26/2005  . Essential hypertension 12/26/2005    Current Outpatient Prescriptions:  .  amiodarone (PACERONE) 200 MG tablet, Take 200 mg by mouth daily., Disp: , Rfl:  .  amLODipine (NORVASC) 5 MG tablet, Take 1 tablet (5 mg total) by mouth daily., Disp: 30 tablet, Rfl: 6 .  atorvastatin (LIPITOR) 40 MG tablet, Take 1 tablet (40 mg total) by mouth daily., Disp: 90 tablet, Rfl: 3 .  carvedilol (COREG) 12.5 MG tablet, Take 1.5 tablets (18.75 mg total) by mouth 2 (two) times daily with a meal., Disp: 90 tablet, Rfl: 6 .  hydrALAZINE (APRESOLINE) 100 MG tablet, TAKE 1 TABLET BY MOUTH 3 TIMES DAILY., Disp: 90 tablet, Rfl: 2 .  insulin aspart (NOVOLOG) 100 UNIT/ML injection, Inject 6 Units into the skin daily with breakfast., Disp: 10 mL, Rfl: 11 .  insulin glargine (LANTUS) 100 UNIT/ML injection, Inject 0.25 mLs (25 Units total) into the skin at bedtime., Disp: 10 mL, Rfl: 11 .  Insulin Syringe-Needle U-100 31G X 15/64" 0.3 ML MISC, Use to inject insulin 4 times a day, Disp: 200 each, Rfl: 12 .  isosorbide mononitrate (IMDUR) 30 MG 24 hr tablet, Take 3 tablets (90 mg total) by mouth daily., Disp: 270 tablet, Rfl: 3 .  magnesium oxide (MAG-OX) 400 (241.3 Mg) MG tablet, Take 1 tablet (400 mg total) by mouth daily., Disp: 30 tablet, Rfl:  6 .  metolazone (ZAROXOLYN) 2.5 MG tablet, Take 1 tablet (2.5 mg total) by mouth as needed. Take as directed, Disp: 5 tablet, Rfl: 0 .  potassium chloride SA (K-DUR,KLOR-CON) 20 MEQ tablet, Take 2 tabs in AM and 1 tab in PM, Disp: 90 tablet, Rfl: 3 .  ranolazine (RANEXA) 500 MG 12 hr tablet, Take 1 tablet (500 mg total) by mouth 2 (two) times daily., Disp: 60 tablet, Rfl: 6 .  torsemide (DEMADEX) 20 MG tablet, Take 3 tabs every other day ALTERNATING with 4 tabs every other day, Disp: 120 tablet, Rfl: 3 .  warfarin (COUMADIN) 5 MG tablet, Take As Directed by [facility] Clinic, Disp: 45 tablet, Rfl: 2 Allergies  Allergen Reactions  . No Known Allergies      Social History   Social History  . Marital status: Widowed    Spouse name: N/A  . Number of children: N/A  . Years of education: N/A   Occupational History  . Not on file.   Social History Main Topics  . Smoking status: Former Smoker    Years: 10.00    Types: Cigars    Quit date: 07/28/2002  . Smokeless tobacco: Never Used  . Alcohol use No     Comment: 03/28/2016 "stopped drinking 4-5 months ago"  . Drug use: No  . Sexual activity: Not Currently   Other Topics Concern  . Not on file   Social History Narrative  . No  narrative on file    Physical Exam      Future Appointments Date Time Provider [employer]  10/06/2016 9:00 AM [facility]  10/23/2016 10:00 AM [facility] PA/NP [facility] None  12/06/2016 1:15 PM Zada Finders, MD [facility]  12/14/2016 12:20 PM [facility] DEVICE REMOTES [facility]   BP 110/78 (BP Location: Right Arm, Patient Position: Sitting, Cuff Size: Normal)   Pulse 67   Resp 16   Wt 229 lb 11.2 oz (104.2 kg)   SpO2 97%   BMI 32.96 kg/m  Weight yesterday- 228 lbs Last visit weight- 236 lbs  Mr Lloyd was seen at home to check his vital signs today. Friday he had an elevated BP, increased weight and SPO2 of 88%. Was ordered to take  extra torsemide over the weekend. Weight has decreased, BP is back within normal limits and SPO2 is 97%.  Jacquiline Doe, EMT 09/25/16  ACTION: Home visit completed Next visit planned for 2 weeks

## 2016-09-27 LAB — T3, FREE: T3, Free: 2.5 pg/mL (ref 2.0–4.4)

## 2016-09-28 ENCOUNTER — Telehealth (HOSPITAL_COMMUNITY): Payer: Self-pay | Admitting: *Deleted

## 2016-09-28 DIAGNOSIS — I5022 Chronic systolic (congestive) heart failure: Secondary | ICD-10-CM

## 2016-09-28 MED ORDER — TORSEMIDE 20 MG PO TABS: 60.0000 mg | ORAL_TABLET | Freq: Every day | ORAL | 3 refills | Status: DC

## 2016-09-28 NOTE — Telephone Encounter (Signed)
-----   Message from Larey Dresser, MD sent at 09/26/2016  9:58 PM EDT ----- Decrease torsemide back to 60 mg daily with creatinine up.   Repeat BMET 1 week.  Free T4 and TSH elevated.  When he has repeat BMET, check free T3 also.

## 2016-10-05 ENCOUNTER — Other Ambulatory Visit (HOSPITAL_COMMUNITY): Payer: BLUE CROSS/BLUE SHIELD

## 2016-10-07 ENCOUNTER — Emergency Department (HOSPITAL_COMMUNITY): Payer: BLUE CROSS/BLUE SHIELD

## 2016-10-07 ENCOUNTER — Emergency Department (HOSPITAL_COMMUNITY)
Admission: EM | Admit: 2016-10-07 | Discharge: 2016-10-07 | Disposition: A | Discharge status: Home / Self Care | Payer: BLUE CROSS/BLUE SHIELD | Attending: Emergency Medicine | Admitting: Emergency Medicine

## 2016-10-07 ENCOUNTER — Encounter (HOSPITAL_COMMUNITY): Payer: Self-pay

## 2016-10-07 DIAGNOSIS — I110 Hypertensive heart disease with heart failure: Secondary | ICD-10-CM | POA: Insufficient documentation

## 2016-10-07 DIAGNOSIS — M25572 Pain in left ankle and joints of left foot: Secondary | ICD-10-CM

## 2016-10-07 DIAGNOSIS — E119 Type 2 diabetes mellitus without complications: Secondary | ICD-10-CM | POA: Insufficient documentation

## 2016-10-07 DIAGNOSIS — M25562 Pain in left knee: Secondary | ICD-10-CM | POA: Diagnosis present

## 2016-10-07 DIAGNOSIS — I5022 Chronic systolic (congestive) heart failure: Secondary | ICD-10-CM | POA: Diagnosis not present

## 2016-10-07 DIAGNOSIS — F17290 Nicotine dependence, other tobacco product, uncomplicated: Secondary | ICD-10-CM | POA: Diagnosis not present

## 2016-10-07 DIAGNOSIS — Z95810 Presence of automatic (implantable) cardiac defibrillator: Secondary | ICD-10-CM | POA: Diagnosis not present

## 2016-10-07 DIAGNOSIS — Z794 Long term (current) use of insulin: Secondary | ICD-10-CM | POA: Insufficient documentation

## 2016-10-07 DIAGNOSIS — Z7901 Long term (current) use of anticoagulants: Secondary | ICD-10-CM | POA: Insufficient documentation

## 2016-10-07 MED ORDER — METHOCARBAMOL 500 MG PO TABS: 500.0000 mg | ORAL_TABLET | Freq: Two times a day (BID) | ORAL | 0 refills | Status: DC

## 2016-10-07 NOTE — ED Notes (Signed)
No answer x3

## 2016-10-07 NOTE — ED Triage Notes (Signed)
Pt reports left medial knee pain and left ankle pain unrelieved with tylenol and Lidocaine patches. Pain started three days ago. Denies recent injury. States hx of gout.

## 2016-10-07 NOTE — ED Provider Notes (Signed)
[facility] DEPT Provider Note   CSN: [id number] Arrival date & time: 10/07/16  1722     History   Chief Complaint Chief Complaint  Patient presents with  . Knee Pain  . Ankle Pain    HPI Melville Engen is a 46 y.o. male presents with persistent left knee pain and ankle pain that has been ongoing for 3 days ago. Patient denies any preceding trauma, injury, fall. He reports that he has been taking Tylenol with minimal relief of symptoms. He has been using lidocaine patches which has helped temporarily. He reports that he is still able to ambulate but that induration worsens his pain. He has been using a cane to help with ambulation which is abnormal for him. Patient has a history of gout and states that he has gotten in his foot before. Patient denies any redness, warmth to the ankle or knee. Patient denies any recent fever, numbness/weakness of the lower extremity, recent weight loss, night sweats.   The history is provided by the patient.    Past Medical History:  Diagnosis Date  . AICD (automatic cardioverter/defibrillator) present   . Atrial flutter ([place])    a. s/p ablation 03/2016  . Chronic systolic CHF (congestive heart failure) ([place])   . History of gout   . Hyperlipidemia   . Hypertension   . Myocardial infarction [facility])    "I think I had one a long long time ago" (03/28/2016)  . NICM (nonischemic cardiomyopathy) ([place])    a. LHC 6/06: pLAD 20, pLCx 20-30; b. Echo 5/15:  EF 15%, diffuse HK, restrictive physiology, trivial AI, trivial MR, mild LAE, moderate RVE, moderately reduced RVSF, moderate RAE, mild to moderate TR, PASP 43 mmHg  . Obesity   . Persistent atrial fibrillation ([place])   . Type II diabetes mellitus [facility])     Patient Active Problem List   Diagnosis Date Noted  . Healthcare maintenance 06/24/2016  . Persistent atrial fibrillation ([place])   . Chronic kidney disease, stage IV (severe) ([place])   . Typical atrial flutter ([place])   . CARDIOMYOPATHY, SECONDARY  07/03/2008  . Chronic systolic heart failure ([place]) 07/03/2008  . Automatic implantable cardioverter-defibrillator in situ 07/03/2008  . Type 2 diabetes mellitus ([place]) 12/26/2005  . Hyperlipidemia associated with type 2 diabetes mellitus ([place]) 12/26/2005  . Essential hypertension 12/26/2005    Past Surgical History:  Procedure Laterality Date  . CARDIOVERSION N/A 04/07/2016   Procedure: CARDIOVERSION;  Surgeon: Thayer Headings, MD;  Location[facility] ENDOSCOPY;  Service: Cardiovascular;  Laterality: N/A;  . CARDIOVERSION N/A 04/11/2016   Procedure: CARDIOVERSION;  Surgeon: Larey Dresser, MD;  Location: [place];  Service: Cardiovascular;  Laterality: N/A;  . ELECTROPHYSIOLOGIC STUDY N/A 03/31/2016   Procedure: A-Flutter Ablation;  Surgeon: [place] Lance, MD;  Location: [place] CV LAB;  Service: Cardiovascular;  Laterality: N/A;  . IMPLANTABLE CARDIOVERTER DEFIBRILLATOR (ICD) GENERATOR CHANGE N/A 08/27/2013   Procedure: ICD GENERATOR CHANGE;  Surgeon: [place] Lance, MD;  Location: [facility] CATH LAB;  Service: Cardiovascular;  Laterality: N/A;  . TEE WITHOUT CARDIOVERSION N/A 03/31/2016   Procedure: TRANSESOPHAGEAL ECHOCARDIOGRAM (TEE);  Surgeon: Larey Dresser, MD;  Location: [place];  Service: Cardiovascular;  Laterality: N/A;       Home Medications    Prior to Admission medications   Medication Sig Start Date End Date Taking? Authorizing Provider  amiodarone (PACERONE) 200 MG tablet Take 200 mg by mouth daily.    [provider]  amLODipine (NORVASC) 5 MG tablet  Take 1 tablet (5 mg total) by mouth daily. 09/14/16   Larey Dresser, MD  atorvastatin (LIPITOR) 40 MG tablet Take 1 tablet (40 mg total) by mouth daily. 09/11/16   Larey Dresser, MD  carvedilol (COREG) 12.5 MG tablet Take 1.5 tablets (18.75 mg total) by mouth 2 (two) times daily with a meal. 09/11/16   Larey Dresser, MD  hydrALAZINE (APRESOLINE) 100 MG tablet TAKE 1 TABLET BY MOUTH 3 TIMES DAILY. 08/25/16    Bensimhon, Shaune Pascal, MD  insulin aspart (NOVOLOG) 100 UNIT/ML injection Inject 6 Units into the skin daily with breakfast. 06/24/16   Zada Finders, MD  insulin glargine (LANTUS) 100 UNIT/ML injection Inject 0.25 mLs (25 Units total) into the skin at bedtime. 04/14/16   Shirley Friar, PA-C  Insulin Syringe-Needle U-100 31G X 15/64" 0.3 ML MISC Use to inject insulin 4 times a day 07/27/16   Sid Falcon, MD  isosorbide mononitrate (IMDUR) 30 MG 24 hr tablet Take 3 tablets (90 mg total) by mouth daily. 09/14/16 12/13/16  Larey Dresser, MD  magnesium oxide (MAG-OX) 400 (241.3 Mg) MG tablet Take 1 tablet (400 mg total) by mouth daily. 04/15/16   Shirley Friar, PA-C  methocarbamol (ROBAXIN) 500 MG tablet Take 1 tablet (500 mg total) by mouth 2 (two) times daily. 10/07/16   Volanda Napoleon, PA-C  metolazone (ZAROXOLYN) 2.5 MG tablet Take 1 tablet (2.5 mg total) by mouth as needed. Take as directed 08/18/16   Larey Dresser, MD  potassium chloride SA (K-DUR,KLOR-CON) 20 MEQ tablet Take 2 tabs in AM and 1 tab in PM 09/14/16   Larey Dresser, MD  ranolazine (RANEXA) 500 MG 12 hr tablet Take 1 tablet (500 mg total) by mouth 2 (two) times daily. 09/14/16   Larey Dresser, MD  torsemide (DEMADEX) 20 MG tablet Take 3 tablets (60 mg total) by mouth daily. 09/28/16   Larey Dresser, MD  warfarin (COUMADIN) 5 MG tablet Take As Directed by [facility]madin Clinic 08/18/16   [place] Lance, MD    Family History Family History  Problem Relation Age of Onset  . Hypertension Mother   . Heart disease Mother   . Diabetes Mother     Social History Social History  Substance Use Topics  . Smoking status: Former Smoker    Years: 10.00    Types: Cigars    Quit date: 07/28/2002  . Smokeless tobacco: Never Used  . Alcohol use No     Comment: 03/28/2016 "stopped drinking 4-5 months ago"     Allergies   No known allergies   Review of Systems Review of Systems  Constitutional: Negative for  diaphoresis, fever and unexpected weight change.  Musculoskeletal: Positive for arthralgias.  Neurological: Negative for weakness and numbness.     Physical Exam Updated Vital Signs BP 128/82 (BP Location: Left Arm)   Pulse 70   Temp 98.1 F (36.7 C) (Oral)   Resp 18   Ht 5\' 10"  (1.778 m)   Wt 107 kg (236 lb)   SpO2 95%   BMI 33.86 kg/m   Physical Exam  Constitutional: He appears well-developed and well-nourished.  Sitting comfortably on examination table  HENT:  Head: Normocephalic and atraumatic.  Eyes: Conjunctivae and EOM are normal. Right eye exhibits no discharge. Left eye exhibits no discharge. No scleral icterus.  Cardiovascular:  Pulses:      Dorsalis pedis pulses are 2+ on the right side, and 2+ on the left  side.  Pulmonary/Chest: Effort normal.  Musculoskeletal:  Tenderness palpation to the medial aspect of the left knee. No deformity or crepitus noted. No overlying warmth, erythema, ecchymosis, edema. Flexion/extension intact with subjective reports of pain. Negative posterior/anterior drawer test. No valgus or varus instability. Full range of motion of right lower extremity without difficulty. Tenderness palpation to the medial aspect of the left ankle. No overlying warmth, erythema, ecchymosis. No deformity or crepitus noted. Very minimal soft tissue swelling of the left ankle. Full range of motion of bilateral ankles without difficulty.  Neurological: He is alert.  Sensation intact throughout all major nerve patient to the foot  Skin: Skin is warm and dry.  Psychiatric: He has a normal mood and affect. His speech is normal and behavior is normal.  Nursing note and vitals reviewed.    ED Treatments / Results  Labs (all labs ordered are listed, but only abnormal results are displayed) Labs Reviewed - No data to display  EKG  EKG Interpretation None       Radiology Dg Ankle Complete Left  Result Date: 10/07/2016 CLINICAL DATA:  Generalize knee and  ankle pain.  No known injury. EXAM: LEFT ANKLE COMPLETE - 3+ VIEW COMPARISON:  None. FINDINGS: Diffuse soft tissue swelling about the left ankle. No evidence of acute fracture or dislocation. No focal bone lesion or bone destruction. Bone cortex appears intact. Small plantar calcaneal spur. IMPRESSION: Soft tissue swelling about the left ankle. No acute bony abnormalities. Electronically Signed   By: Lucienne Capers M.D.   On: 10/07/2016 18:41   Dg Knee Complete 4 Views Left  Result Date: 10/07/2016 CLINICAL DATA:  Generalized knee pain and ankle pain. No known injury. EXAM: LEFT KNEE - COMPLETE 4+ VIEW COMPARISON:  None. FINDINGS: Vague circumscribed lucent lesion demonstrated along the left lateral tibial metaphysis in a peripheral location. No abnormal periosteal reaction or expansile change. No cortical break. The lesion probably represents an old fibrous cortical defect although a giant cell tumor could also potentially have this appearance. Consider MRI for further evaluation. No evidence of acute fracture or dislocation. No significant effusion. Soft tissues are unremarkable. IMPRESSION: Lucent lesion demonstrated along the left lateral tibial metaphysis could represent old fibrous cortical defect or giant cell tumor. Consider MRI for further evaluation and follow-up. No acute bony abnormalities are identified. Electronically Signed   By: Lucienne Capers M.D.   On: 10/07/2016 18:40    Procedures Procedures (including critical care time)  Medications Ordered in ED Medications - No data to display   Initial Impression / Assessment and Plan / ED Course  I have reviewed the triage vital signs and the nursing notes.  Pertinent labs & imaging results that were available during my care of the patient were reviewed by me and considered in my medical decision making (see chart for details).     46 year old male who presents with 3 days of left knee and ankle pain. No history of trauma fall or  injury. Patient is afebrile, non-toxic appearing, sitting comfortably on examination table. Vital signs reviewed. Patient is neurovascularly intact. History/physical exam or not concerning for septic arthritis Or acute infectious etiology. Consider sprain versus dislocation versus fracture. Also consider gout given history but physical exam does not seem consistent with gout. Patient is able to tolerate me palpating the ankle. He has very minimal tenderness. X-rays ordered in triage.  X-rays reviewed. There is a questionable lucency of the medial serial metaphysis that is possible foreign old healed fracture or giant  cell tumor. Recommends further MRI evaluation. Discussed patient with Dr. Ralene Bathe. Discussed results with the patient's. He has a primary care doctor, Dr. Posey Pronto who he sees regularly. He states that he is scheduled to see him at the end of this week for regular scheduled lab work. Instructed patient that he needs to call Dr. Serita Grit office on Monday and arrange for an appointment to be seen to schedule an outpatient MRI. Patient and I used the talk back method and he was able to repeat back to me with full understanding of what was going on. He is aware that he needs to have MRI for further evaluation of suspected findings on x-ray. Plan to provide Ace wrap and splint for stabilization and support in the emergency department. Symptomatic treatment given. Strict return precautions discussed. Patient expresses understanding and agreement to plan.  Final Clinical Impressions(s) / ED Diagnoses   Final diagnoses:  Acute pain of left knee  Acute left ankle pain    New Prescriptions New Prescriptions   METHOCARBAMOL (ROBAXIN) 500 MG TABLET    Take 1 tablet (500 mg total) by mouth 2 (two) times daily.     Volanda Napoleon, PA-C 10/08/16 [id number]    Quintella Reichert, MD 10/08/16 [phone]

## 2016-10-07 NOTE — Discharge Instructions (Signed)
As we discussed, there is a finding on the knee x-ray that they cannot precisely identified. They're unsure if this is an old fracture that is healed or if this is a potential tumor of the bone. This needs further evaluation by an MRI. As we discussed, you need to call your doctor, Dr. Posey Pronto on Monday and tell him you were seen in the emergency department. Take your paperwork with you and have him arrange for an outpatient MRI for further evaluation.  Take Robaxin as directed for pain.  Follow the RICE (Rest, Ice, Compression, Elevation) protocol as directed.   Return to the emergency department for any worsening pain, redness/swelling of the knee that starts to spread, redness/swelling of the ankle that starts to spread, warmth of any or ankle, fever, numbness/weakness of her leg or any other worsening or concerning symptoms.

## 2016-10-09 ENCOUNTER — Inpatient Hospital Stay (HOSPITAL_COMMUNITY): Admission: RE | Admit: 2016-10-09 | Payer: BLUE CROSS/BLUE SHIELD | Source: Ambulatory Visit

## 2016-10-10 ENCOUNTER — Ambulatory Visit (INDEPENDENT_AMBULATORY_CARE_PROVIDER_SITE_OTHER): Payer: BLUE CROSS/BLUE SHIELD | Admitting: *Deleted

## 2016-10-10 ENCOUNTER — Ambulatory Visit (HOSPITAL_COMMUNITY)
Admission: RE | Admit: 2016-10-10 | Discharge: 2016-10-10 | Disposition: A | Discharge status: Home / Self Care | Payer: BLUE CROSS/BLUE SHIELD | Source: Ambulatory Visit | Attending: Cardiology | Admitting: Cardiology

## 2016-10-10 DIAGNOSIS — I4819 Other persistent atrial fibrillation: Secondary | ICD-10-CM

## 2016-10-10 DIAGNOSIS — Z5181 Encounter for therapeutic drug level monitoring: Secondary | ICD-10-CM | POA: Diagnosis not present

## 2016-10-10 DIAGNOSIS — I481 Persistent atrial fibrillation: Secondary | ICD-10-CM

## 2016-10-10 DIAGNOSIS — I5022 Chronic systolic (congestive) heart failure: Secondary | ICD-10-CM

## 2016-10-10 DIAGNOSIS — I483 Typical atrial flutter: Secondary | ICD-10-CM

## 2016-10-10 DIAGNOSIS — Z95810 Presence of automatic (implantable) cardiac defibrillator: Secondary | ICD-10-CM

## 2016-10-10 LAB — BASIC METABOLIC PANEL
Anion gap: 8 (ref 5–15)
BUN: 31 mg/dL — AB (ref 6–20)
CHLORIDE: 103 mmol/L (ref 101–111)
CO2: 28 mmol/L (ref 22–32)
Calcium: 9.1 mg/dL (ref 8.9–10.3)
Creatinine, Ser: 2.71 mg/dL — ABNORMAL HIGH (ref 0.61–1.24)
GFR, EST AFRICAN AMERICAN: 31 mL/min — AB (ref >60)
GFR, EST NON AFRICAN AMERICAN: 27 mL/min — AB (ref >60)
Glucose, Bld: 192 mg/dL — ABNORMAL HIGH (ref 65–99)
POTASSIUM: 3.4 mmol/L — AB (ref 3.5–5.1)
SODIUM: 139 mmol/L (ref 135–145)

## 2016-10-10 LAB — CUP PACEART REMOTE DEVICE CHECK
Battery Voltage: 2.98 V
Brady Statistic RV Percent Paced: 1 %
HighPow Impedance: 52 Ohm
HighPow Impedance: 53 Ohm
Implantable Lead Location: 753860
Implantable Lead Model: 7121
Lead Channel Pacing Threshold Pulse Width: 0.5 ms
Lead Channel Sensing Intrinsic Amplitude: 11.8 mV
Lead Channel Setting Pacing Amplitude: 2.5 V
Lead Channel Setting Sensing Sensitivity: 0.5 mV
MDC IDC LEAD IMPLANT DT: 20090211
MDC IDC MSMT BATTERY REMAINING LONGEVITY: 79 mo
MDC IDC MSMT BATTERY REMAINING PERCENTAGE: 75 %
MDC IDC MSMT LEADCHNL RV IMPEDANCE VALUE: 340 Ohm
MDC IDC MSMT LEADCHNL RV PACING THRESHOLD AMPLITUDE: 1 V
MDC IDC PG IMPLANT DT: 20150617
MDC IDC SESS DTM: 20180706055024
MDC IDC SET LEADCHNL RV PACING PULSEWIDTH: 0.5 ms
Pulse Gen Serial Number: 7200640

## 2016-10-10 LAB — POCT INR: INR: 4.4

## 2016-10-12 MED FILL — AMLODIPINE BESYLATE 5 MG TA: 5 | 30 days supply | Qty: 30 | Fill #1

## 2016-10-12 MED FILL — RANEXA ER 500 MG TABLET: 500 | 30 days supply | Qty: 60 | Fill #0

## 2016-10-12 MED FILL — POTASSIUM CL ER 20 MEQ TABL: 20 | 30 days supply | Qty: 90 | Fill #1

## 2016-10-13 MED FILL — ALLOPURINOL 300 MG TABLET: 300 | 30 days supply | Qty: 30 | Fill #0

## 2016-10-18 MED FILL — hydrALAZINE HCL 100 MG TABS: 100 | 30 days supply | Qty: 90 | Fill #1

## 2016-10-18 MED FILL — TORSEMIDE 20 MG TABLET: 20 | 30 days supply | Qty: 105 | Fill #1

## 2016-10-18 MED FILL — CARVEDILOL 12.5 MG TABLET: 12.5 | 30 days supply | Qty: 90 | Fill #1

## 2016-10-18 MED FILL — AMIODARONE HCL 200 MG TAB: 200 | 30 days supply | Qty: 30 | Fill #4

## 2016-10-18 MED FILL — WARFARIN SODIUM 5 MG TABLET: 5 | 30 days supply | Qty: 45 | Fill #2

## 2016-10-19 ENCOUNTER — Other Ambulatory Visit (HOSPITAL_COMMUNITY): Payer: Self-pay

## 2016-10-19 NOTE — Progress Notes (Signed)
Paramedicine Encounter    Patient ID: Travis Bryan, male    DOB: Jul 22, 1970, 46 y.o.   MRN: [id number]   Patient Care Team: Zada Finders, MD as PCP - General (Internal Medicine)  Patient Active Problem List   Diagnosis Date Noted  . Healthcare maintenance 06/24/2016  . Persistent atrial fibrillation ([place])   . Chronic kidney disease, stage IV (severe) ([place])   . Typical atrial flutter ([place])   . CARDIOMYOPATHY, SECONDARY 07/03/2008  . Chronic systolic heart failure ([place]) 07/03/2008  . Automatic implantable cardioverter-defibrillator in situ 07/03/2008  . Type 2 diabetes mellitus ([place]) 12/26/2005  . Hyperlipidemia associated with type 2 diabetes mellitus ([place]) 12/26/2005  . Essential hypertension 12/26/2005    Current Outpatient Prescriptions:  .  amiodarone (PACERONE) 200 MG tablet, Take 200 mg by mouth daily., Disp: , Rfl:  .  amLODipine (NORVASC) 5 MG tablet, Take 1 tablet (5 mg total) by mouth daily., Disp: 30 tablet, Rfl: 6 .  atorvastatin (LIPITOR) 40 MG tablet, Take 1 tablet (40 mg total) by mouth daily., Disp: 90 tablet, Rfl: 3 .  carvedilol (COREG) 12.5 MG tablet, Take 1.5 tablets (18.75 mg total) by mouth 2 (two) times daily with a meal., Disp: 90 tablet, Rfl: 6 .  hydrALAZINE (APRESOLINE) 100 MG tablet, TAKE 1 TABLET BY MOUTH 3 TIMES DAILY., Disp: 90 tablet, Rfl: 2 .  insulin aspart (NOVOLOG) 100 UNIT/ML injection, Inject 6 Units into the skin daily with breakfast., Disp: 10 mL, Rfl: 11 .  insulin glargine (LANTUS) 100 UNIT/ML injection, Inject 0.25 mLs (25 Units total) into the skin at bedtime., Disp: 10 mL, Rfl: 11 .  Insulin Syringe-Needle U-100 31G X 15/64" 0.3 ML MISC, Use to inject insulin 4 times a day, Disp: 200 each, Rfl: 12 .  isosorbide mononitrate (IMDUR) 30 MG 24 hr tablet, Take 3 tablets (90 mg total) by mouth daily., Disp: 270 tablet, Rfl: 3 .  magnesium oxide (MAG-OX) 400 (241.3 Mg) MG tablet, Take 1 tablet (400 mg total) by mouth daily., Disp: 30 tablet, Rfl:  6 .  metolazone (ZAROXOLYN) 2.5 MG tablet, Take 1 tablet (2.5 mg total) by mouth as needed. Take as directed, Disp: 5 tablet, Rfl: 0 .  potassium chloride SA (K-DUR,KLOR-CON) 20 MEQ tablet, Take 2 tabs in AM and 1 tab in PM, Disp: 90 tablet, Rfl: 3 .  ranolazine (RANEXA) 500 MG 12 hr tablet, Take 1 tablet (500 mg total) by mouth 2 (two) times daily., Disp: 60 tablet, Rfl: 6 .  torsemide (DEMADEX) 20 MG tablet, Take 3 tablets (60 mg total) by mouth daily., Disp: 90 tablet, Rfl: 3 .  methocarbamol (ROBAXIN) 500 MG tablet, Take 1 tablet (500 mg total) by mouth 2 (two) times daily. (Patient not taking: Reported on 10/19/2016), Disp: 10 tablet, Rfl: 0 .  warfarin (COUMADIN) 5 MG tablet, Take As Directed by Coumadin Clinic, Disp: 45 tablet, Rfl: 2 Allergies  Allergen Reactions  . No Known Allergies      Social History   Social History  . Marital status: Widowed    Spouse name: N/A  . Number of children: N/A  . Years of education: N/A   Occupational History  . Not on file.   Social History Main Topics  . Smoking status: Former Smoker    Years: 10.00    Types: Cigars    Quit date: 07/28/2002  . Smokeless tobacco: Never Used  . Alcohol use No     Comment: 03/28/2016 "stopped drinking 4-5 months ago"  . Drug  use: No  . Sexual activity: Not Currently   Other Topics Concern  . Not on file   Social History Narrative  . No narrative on file    Physical Exam  Constitutional: He is oriented to person, place, and time.  Cardiovascular: Normal rate and regular rhythm.   Pulmonary/Chest: Effort normal and breath sounds normal. No respiratory distress. He has no wheezes. He has no rales.  Abdominal: Soft.  Musculoskeletal: Normal range of motion. He exhibits no edema.  Neurological: He is alert and oriented to person, place, and time.  Skin: Skin is warm and dry.        Future Appointments Date Time Provider [place]  10/23/2016 10:00 AM [facility] PA/NP [facility] None   10/23/2016 11:30 AM [facility]  12/06/2016 1:15 PM Zada Finders, MD [facility]  12/14/2016 12:20 PM [facility] DEVICE REMOTES [facility]   BP 132/90 (BP Location: Right Arm, Patient Position: Sitting, Cuff Size: Normal)   Pulse 72   Resp 16   Wt 231 lb (104.8 kg)   SpO2 95%   BMI 33.15 kg/m  Weight yesterday- 232 lbs Last visit weight- 229 lbs  Mr Campillo was seen at home today and reports feeling well. He denied SOB, H/A, dizziness or orthopnea. He has been filling his own pillbox for a few months without problems. He reports compliance with all HF and diabetic medications. Medications were verified and his pillbox was filled.  He was seen at the ED last week for knee and ankle pain/swelling. The note says he was prescribed 500 mg Robaxin but he stated he did not get the Rx. He was also given a prescription for allopurinol and had it filled but it was not in his "medications list" in EPIC.   Jacquiline Doe, EMT 10/19/16  ACTION: Home visit completed Next visit planned for 2 weeks

## 2016-10-23 ENCOUNTER — Ambulatory Visit (HOSPITAL_COMMUNITY)
Admission: RE | Admit: 2016-10-23 | Discharge: 2016-10-23 | Disposition: A | Discharge status: Home / Self Care | Payer: BLUE CROSS/BLUE SHIELD | Source: Ambulatory Visit | Attending: Internal Medicine | Admitting: Internal Medicine

## 2016-10-23 ENCOUNTER — Ambulatory Visit (INDEPENDENT_AMBULATORY_CARE_PROVIDER_SITE_OTHER): Payer: BLUE CROSS/BLUE SHIELD

## 2016-10-23 ENCOUNTER — Encounter (HOSPITAL_COMMUNITY): Payer: Self-pay

## 2016-10-23 ENCOUNTER — Other Ambulatory Visit (HOSPITAL_COMMUNITY): Payer: Self-pay

## 2016-10-23 VITALS — BP 154/106 | HR 70 | Wt 236.6 lb

## 2016-10-23 DIAGNOSIS — E1122 Type 2 diabetes mellitus with diabetic chronic kidney disease: Secondary | ICD-10-CM | POA: Diagnosis not present

## 2016-10-23 DIAGNOSIS — I428 Other cardiomyopathies: Secondary | ICD-10-CM

## 2016-10-23 DIAGNOSIS — Z5189 Encounter for other specified aftercare: Secondary | ICD-10-CM | POA: Diagnosis not present

## 2016-10-23 DIAGNOSIS — I472 Ventricular tachycardia, unspecified: Secondary | ICD-10-CM

## 2016-10-23 DIAGNOSIS — Z5181 Encounter for therapeutic drug level monitoring: Secondary | ICD-10-CM | POA: Diagnosis not present

## 2016-10-23 DIAGNOSIS — I481 Persistent atrial fibrillation: Secondary | ICD-10-CM

## 2016-10-23 DIAGNOSIS — I483 Typical atrial flutter: Secondary | ICD-10-CM | POA: Diagnosis not present

## 2016-10-23 DIAGNOSIS — Z7901 Long term (current) use of anticoagulants: Secondary | ICD-10-CM | POA: Diagnosis not present

## 2016-10-23 DIAGNOSIS — Z79899 Other long term (current) drug therapy: Secondary | ICD-10-CM | POA: Diagnosis not present

## 2016-10-23 DIAGNOSIS — N184 Chronic kidney disease, stage 4 (severe): Secondary | ICD-10-CM | POA: Diagnosis not present

## 2016-10-23 DIAGNOSIS — Z8249 Family history of ischemic heart disease and other diseases of the circulatory system: Secondary | ICD-10-CM | POA: Insufficient documentation

## 2016-10-23 DIAGNOSIS — I4892 Unspecified atrial flutter: Secondary | ICD-10-CM | POA: Insufficient documentation

## 2016-10-23 DIAGNOSIS — Z833 Family history of diabetes mellitus: Secondary | ICD-10-CM | POA: Insufficient documentation

## 2016-10-23 DIAGNOSIS — I5022 Chronic systolic (congestive) heart failure: Secondary | ICD-10-CM | POA: Insufficient documentation

## 2016-10-23 DIAGNOSIS — Z87891 Personal history of nicotine dependence: Secondary | ICD-10-CM | POA: Insufficient documentation

## 2016-10-23 DIAGNOSIS — I130 Hypertensive heart and chronic kidney disease with heart failure and stage 1 through stage 4 chronic kidney disease, or unspecified chronic kidney disease: Secondary | ICD-10-CM | POA: Insufficient documentation

## 2016-10-23 DIAGNOSIS — E1165 Type 2 diabetes mellitus with hyperglycemia: Secondary | ICD-10-CM | POA: Diagnosis not present

## 2016-10-23 DIAGNOSIS — Z794 Long term (current) use of insulin: Secondary | ICD-10-CM | POA: Insufficient documentation

## 2016-10-23 DIAGNOSIS — I480 Paroxysmal atrial fibrillation: Secondary | ICD-10-CM | POA: Diagnosis not present

## 2016-10-23 DIAGNOSIS — I4819 Other persistent atrial fibrillation: Secondary | ICD-10-CM

## 2016-10-23 DIAGNOSIS — I10 Essential (primary) hypertension: Secondary | ICD-10-CM | POA: Diagnosis not present

## 2016-10-23 DIAGNOSIS — Z95810 Presence of automatic (implantable) cardiac defibrillator: Secondary | ICD-10-CM

## 2016-10-23 LAB — POCT INR: INR: 3.4

## 2016-10-23 NOTE — Patient Instructions (Signed)
No changes to medication at this time.  No lab work today.  Follow up 2 months. Take all medication as prescribed the day of your appointment. Bring all medications with you to your appointment.  Do the following things EVERYDAY: 1) Weigh yourself in the morning before breakfast. Write it down and keep it in a log. 2) Take your medicines as prescribed 3) Eat low salt foods-Limit salt (sodium) to 2000 mg per day.  4) Stay as active as you can everyday 5) Limit all fluids for the day to less than 2 liters

## 2016-10-23 NOTE — Progress Notes (Signed)
Paramedicine Encounter   Patient ID: Travis Bryan , male,   DOB: 12/12/1970,45 y.o.,  MRN: [id number]   Mr Wulf was seen in the clinic today and reports feeling well. He was running behind this morning and did not take his medicine. His blood pressure was up so Erin requests we see him later this week and if it is still running high, increase Norvasc to 10 mg.   Time spent with patient: 20 minutes  Jacquiline Doe, EMT 10/23/2016   ACTION: Next visit planned for 1 week

## 2016-10-23 NOTE — Progress Notes (Signed)
PCP: Dr. Posey Pronto HF Cardiology: Dr. Aundra Dubin EP: Dr. Lovena Le Nephrology: Dr Lorrene Reid   46 yo with history of poorly controlled DM, atrial flutter and fibrillation, nonischemic cardiomyopathy, and CKD presents for cardiology followup.  Cardiomyopathy has been known for a number of years. Cath in 6/06 showed no obstructive coronary disease.  He had ICD discharge for VT in 10/17 and again in 1/18, both times likely in the setting of CHF exacerbation.    He was admitted in 1/18 with exertional dyspnea/CHF exacerbation.  ICD had also discharged for VT.  He was noted to be in new atrial flutter.  He had a flutter ablation.  However, post-procedure, he developed AKI and cardiogenic shock.  Dobutamine and norepinephrine were required but eventually titrated off.  He developed atrial fibrillation subsequent to the atrial flutter ablation.  He had DCCV to NSR 04/11/16.  He is on amiodarone and ranolazine.   Echo today was reviewed, EF 25-30%, moderate dilation, moderate LVH, mild MR, moderately decreased RV systolic function, PASP 51 mmHg.   He returns today for HF follow up. Overall feeling well, continues to work at [employer]. No SOB with working, no SOB with stairs. Denies chest pain, orthopnea. He is followed by paramedicine, still confused about his medications. He is eating some high salt foods but does the best he can with his income, drinking more than 2L a day, no air conditioning at the [employer] he works in. Weights home 231-232 pounds, stable since last visit. Optivol not working today.     Labs (2/18): hgb 14.6, K 4.9, creatinine 3.43 Labs 04/26/2016: K 4.1 Creatinine 2.79, TSH normal, LFTs normal, hgb 14.4 Labs (4/18): K 3.7, creatinine 2.61 Labs (6/18): LDL 167, HDL 49, hgb A1c 6  ECG (personally reviewed): NSR, 1st degree AVB, IVCD 136 msec  PMH: 1. Chronic systolic CHF: Nonischemic cardiomyopathy.  St Jude ICD.  - LHC (6/06): Nonobstructive CAD.  - Echo (1/18): EF 20%, moderate RV  dilation with moderate-severely decreased RV systolic function, PASP 48 mmHg.  - Cardiolite (1/18): EF 7%, large fixed inferolateral defect no ischemia.  2. Atrial flutter: s/p ablation 1/18.  3. Atrial fibrillation: Paroxysmal.  S/p DCCV 1/18.  4. CKD: Stage IV. 5. Type II diabetes: Poorly controlled over time.  6. VT: 10/17 and 1/18 with ICD discharge.    Social History   Social History  . Marital status: Widowed    Spouse name: N/A  . Number of children: N/A  . Years of education: N/A   Social History Main Topics  . Smoking status: Former Smoker    Years: 10.00    Types: Cigars    Quit date: 07/28/2002  . Smokeless tobacco: Never Used  . Alcohol use No     Comment: 03/28/2016 "stopped drinking 4-5 months ago"  . Drug use: No  . Sexual activity: Not Currently   Other Topics Concern  . None   Social History Narrative  . None   Family History  Problem Relation Age of Onset  . Hypertension Mother   . Heart disease Mother   . Diabetes Mother    ROS: All systems reviewed and negative except as per HPI.   Current Outpatient Prescriptions  Medication Sig Dispense Refill  . amiodarone (PACERONE) 200 MG tablet Take 200 mg by mouth daily.    Marland Kitchen amLODipine (NORVASC) 5 MG tablet Take 1 tablet (5 mg total) by mouth daily. 30 tablet 6  . atorvastatin (LIPITOR) 40 MG tablet Take 1 tablet (40 mg  total) by mouth daily. 90 tablet 3  . carvedilol (COREG) 12.5 MG tablet Take 1.5 tablets (18.75 mg total) by mouth 2 (two) times daily with a meal. 90 tablet 6  . hydrALAZINE (APRESOLINE) 100 MG tablet TAKE 1 TABLET BY MOUTH 3 TIMES DAILY. 90 tablet 2  . insulin aspart (NOVOLOG) 100 UNIT/ML injection Inject 6 Units into the skin daily with breakfast. 10 mL 11  . insulin glargine (LANTUS) 100 UNIT/ML injection Inject 0.25 mLs (25 Units total) into the skin at bedtime. 10 mL 11  . Insulin Syringe-Needle U-100 31G X 15/64" 0.3 ML MISC Use to inject insulin 4 times a day 200 each 12  .  isosorbide mononitrate (IMDUR) 30 MG 24 hr tablet Take 3 tablets (90 mg total) by mouth daily. 270 tablet 3  . magnesium oxide (MAG-OX) 400 (241.3 Mg) MG tablet Take 1 tablet (400 mg total) by mouth daily. 30 tablet 6  . metolazone (ZAROXOLYN) 2.5 MG tablet Take 1 tablet (2.5 mg total) by mouth as needed. Take as directed 5 tablet 0  . potassium chloride SA (K-DUR,KLOR-CON) 20 MEQ tablet Take 2 tabs in AM and 1 tab in PM 90 tablet 3  . ranolazine (RANEXA) 500 MG 12 hr tablet Take 1 tablet (500 mg total) by mouth 2 (two) times daily. 60 tablet 6  . torsemide (DEMADEX) 20 MG tablet Take 3 tablets (60 mg total) by mouth daily. 90 tablet 3  . warfarin (COUMADIN) 5 MG tablet Take As Directed by Coumadin Clinic 45 tablet 2  . methocarbamol (ROBAXIN) 500 MG tablet Take 1 tablet (500 mg total) by mouth 2 (two) times daily. (Patient not taking: Reported on 10/19/2016) 10 tablet 0   No current facility-administered medications for this encounter.    BP (!) 154/106 (BP Location: Right Arm, Patient Position: Sitting, Cuff Size: Normal)   Pulse 70   Wt 236 lb 9.6 oz (107.3 kg)   SpO2 94%   BMI 33.95 kg/m  General: Well appearing. No resp difficulty. HEENT: Normal Neck: Supple. JVP 5-6. Carotids 2+ bilat; no bruits. No thyromegaly or nodule noted. Cor: PMI nondisplaced. RRR, No M/G/R noted Lungs: CTAB, normal effort. Abdomen: Soft, non-tender, non-distended, no HSM. No bruits or masses. +BS  Extremities: No cyanosis, clubbing, rash, R and LLE no edema.  Neuro: Alert & orientedx3, cranial nerves grossly intact. moves all 4 extremities w/o difficulty. Affect pleasant   Assessment/Plan: 1. Chronic systolic CHF: Nonischemic cardiomyopathy.  St Jude ICD.  Echo 1/18 with EF 20% and moderate to severely decreased RV systolic function.  Echo 6/18 with EF 25-30%, moderate dilation, moderate LVH, mild MR, moderately decreased RV systolic function, PASP 51 mmHg.  - NYHA II - Volume status stable, continue 80 mg  torsemide alternating with 60 mg torsemide daily.  - Continue Coreg 18.75 mg BID - Continue hydralazine 100 mg TID and Imdur 90 mg daily.  - No ACEI/ARB/ARNI or spironolactone with CKD.  - IVCD 136 msec, no benefit with CRT-D.  - Discussed medication compliance and dietary compliance today at length.    2. Atrial fibrillation: - NSR today - Continue amiodarone. Recent LFT's stable.  - Continue warfarin for anticoagulation. Follows with the coumadin clinic.   3. HTN: BP elevated but he has not taken his medications today.  - I have asked Paramedicine to check his BP this week and we can increase Norvasc to 10 mg if he remains hypertensive.    4. Diabetes: Last A1c was 6.0  - Follows with  PCP.   5. VT: Now on amiodarone.  - No further  6. CKD: Stage IV.   - Follows with Dr. Lorrene Reid.   Follow up in 2 months. Recent BMET stable.   Arbutus Leas 10/23/2016   Greater than 50% of the (total minutes 30) visit spent in counseling/coordination of care regarding details of discussion medication compliance, dietary compliance, education, coordinating care.

## 2016-10-25 ENCOUNTER — Emergency Department (HOSPITAL_COMMUNITY)
Admission: EM | Admit: 2016-10-25 | Discharge: 2016-10-26 | Disposition: A | Discharge status: Home / Self Care | Payer: BLUE CROSS/BLUE SHIELD | Attending: Emergency Medicine | Admitting: Emergency Medicine

## 2016-10-25 ENCOUNTER — Emergency Department (HOSPITAL_COMMUNITY): Payer: BLUE CROSS/BLUE SHIELD

## 2016-10-25 ENCOUNTER — Encounter (HOSPITAL_COMMUNITY): Payer: Self-pay

## 2016-10-25 DIAGNOSIS — E119 Type 2 diabetes mellitus without complications: Secondary | ICD-10-CM | POA: Insufficient documentation

## 2016-10-25 DIAGNOSIS — I110 Hypertensive heart disease with heart failure: Secondary | ICD-10-CM | POA: Diagnosis not present

## 2016-10-25 DIAGNOSIS — Z79899 Other long term (current) drug therapy: Secondary | ICD-10-CM | POA: Diagnosis not present

## 2016-10-25 DIAGNOSIS — Z794 Long term (current) use of insulin: Secondary | ICD-10-CM | POA: Insufficient documentation

## 2016-10-25 DIAGNOSIS — I5022 Chronic systolic (congestive) heart failure: Secondary | ICD-10-CM | POA: Diagnosis not present

## 2016-10-25 DIAGNOSIS — I252 Old myocardial infarction: Secondary | ICD-10-CM | POA: Diagnosis not present

## 2016-10-25 DIAGNOSIS — M79671 Pain in right foot: Secondary | ICD-10-CM

## 2016-10-25 DIAGNOSIS — Z87891 Personal history of nicotine dependence: Secondary | ICD-10-CM | POA: Insufficient documentation

## 2016-10-25 DIAGNOSIS — Z7901 Long term (current) use of anticoagulants: Secondary | ICD-10-CM | POA: Diagnosis not present

## 2016-10-25 LAB — CBC
HCT: 38.4 % — ABNORMAL LOW (ref 39.0–52.0)
Hemoglobin: 12.6 g/dL — ABNORMAL LOW (ref 13.0–17.0)
MCH: 29.6 pg (ref 26.0–34.0)
MCHC: 32.8 g/dL (ref 30.0–36.0)
MCV: 90.4 fL (ref 78.0–100.0)
Platelets: 295 10*3/uL (ref 150–400)
RBC: 4.25 MIL/uL (ref 4.22–5.81)
RDW: 14 % (ref 11.5–15.5)
WBC: 8.4 10*3/uL (ref 4.0–10.5)

## 2016-10-25 LAB — BASIC METABOLIC PANEL
Anion gap: 10 (ref 5–15)
BUN: 23 mg/dL — AB (ref 6–20)
CALCIUM: 9 mg/dL (ref 8.9–10.3)
CO2: 27 mmol/L (ref 22–32)
CREATININE: 2.86 mg/dL — AB (ref 0.61–1.24)
Chloride: 101 mmol/L (ref 101–111)
GFR calc Af Amer: 29 mL/min — ABNORMAL LOW (ref >60)
GFR, EST NON AFRICAN AMERICAN: 25 mL/min — AB (ref >60)
GLUCOSE: 210 mg/dL — AB (ref 65–99)
Potassium: 3.6 mmol/L (ref 3.5–5.1)
Sodium: 138 mmol/L (ref 135–145)

## 2016-10-25 LAB — CBG MONITORING, ED: Glucose-Capillary: 211 mg/dL — ABNORMAL HIGH (ref 65–99)

## 2016-10-25 MED ORDER — ACETAMINOPHEN 325 MG PO TABS
650.0000 mg | ORAL_TABLET | Freq: Once | ORAL | Status: AC
  Administered 2016-10-25: 650 mg via ORAL
  Filled 2016-10-25: qty 2

## 2016-10-25 MED ORDER — DICLOFENAC SODIUM 1 % TD GEL: 2.0000 g | Freq: Four times a day (QID) | TRANSDERMAL | 0 refills | Status: DC

## 2016-10-25 NOTE — ED Triage Notes (Signed)
Pt states he was at work Monday night and had onset of pain in the right foot. Pt denies injuring the foot. Pt has foot wrapped in ace wrap, ambulatory with a cane. Pt states hx of gout.

## 2016-10-25 NOTE — ED Provider Notes (Signed)
[facility] DEPT Provider Note   CSN: [id number] Arrival date & time: 10/25/16  1617     History   Chief Complaint Chief Complaint  Patient presents with  . Foot Pain    HPI Travis Bryan is a 46 y.o. male presenting with sudden onset right foot pain since monday. He has tried tylenol with mild relief. He reports a hx of gout and is currently compliant with allopurinol. Explains that his gout flares typically occur in the left foot including the top of the foot, but this time it is on the right. Nares fever, chills, warmth, swelling, injury, insect bite or trauma.  HPI  Past Medical History:  Diagnosis Date  . AICD (automatic cardioverter/defibrillator) present   . Atrial flutter ([place])    a. s/p ablation 03/2016  . Chronic systolic CHF (congestive heart failure) ([place])   . History of gout   . Hyperlipidemia   . Hypertension   . Myocardial infarction [facility])    "I think I had one a long long time ago" (03/28/2016)  . NICM (nonischemic cardiomyopathy) ([place])    a. LHC 6/06: pLAD 20, pLCx 20-30; b. Echo 5/15:  EF 15%, diffuse HK, restrictive physiology, trivial AI, trivial MR, mild LAE, moderate RVE, moderately reduced RVSF, moderate RAE, mild to moderate TR, PASP 43 mmHg  . Obesity   . Persistent atrial fibrillation ([place])   . Type II diabetes mellitus [facility])     Patient Active Problem List   Diagnosis Date Noted  . Healthcare maintenance 06/24/2016  . Persistent atrial fibrillation ([place])   . Chronic kidney disease, stage IV (severe) ([place])   . Typical atrial flutter ([place])   . CARDIOMYOPATHY, SECONDARY 07/03/2008  . Chronic systolic heart failure ([place]) 07/03/2008  . Automatic implantable cardioverter-defibrillator in situ 07/03/2008  . Type 2 diabetes mellitus ([place]) 12/26/2005  . Hyperlipidemia associated with type 2 diabetes mellitus ([place]) 12/26/2005  . Essential hypertension 12/26/2005    Past Surgical History:  Procedure Laterality Date  . CARDIOVERSION N/A 04/07/2016     Procedure: CARDIOVERSION;  Surgeon: Thayer Headings, MD;  Location: [facility] ENDOSCOPY;  Service: Cardiovascular;  Laterality: N/A;  . CARDIOVERSION N/A 04/11/2016   Procedure: CARDIOVERSION;  Surgeon: Larey Dresser, MD;  Location: [facility];  Service: Cardiovascular;  Laterality: N/A;  . ELECTROPHYSIOLOGIC STUDY N/A 03/31/2016   Procedure: A-Flutter Ablation;  Surgeon: Evans Lance, MD;  Location: [place] CV LAB;  Service: Cardiovascular;  Laterality: N/A;  . IMPLANTABLE CARDIOVERTER DEFIBRILLATOR (ICD) GENERATOR CHANGE N/A 08/27/2013   Procedure: ICD GENERATOR CHANGE;  Surgeon: Evans Lance, MD;  Location: [facility] CATH LAB;  Service: Cardiovascular;  Laterality: N/A;  . TEE WITHOUT CARDIOVERSION N/A 03/31/2016   Procedure: TRANSESOPHAGEAL ECHOCARDIOGRAM (TEE);  Surgeon: Larey Dresser, MD;  Location: [place];  Service: Cardiovascular;  Laterality: N/A;       Home Medications    Prior to Admission medications   Medication Sig Start Date End Date Taking? Authorizing Provider  allopurinol (ZYLOPRIM) 100 MG tablet Take 100 mg by mouth daily.    [provider]  amiodarone (PACERONE) 200 MG tablet Take 200 mg by mouth daily.    [provider]  amLODipine (NORVASC) 5 MG tablet Take 1 tablet (5 mg total) by mouth daily. 09/14/16   Larey Dresser, MD  atorvastatin (LIPITOR) 40 MG tablet Take 1 tablet (40 mg total) by mouth daily. 09/11/16   Larey Dresser, MD  carvedilol (COREG) 12.5 MG tablet Take 1.5 tablets (18.75 mg  total) by mouth 2 (two) times daily with a meal. 09/11/16   Larey Dresser, MD  diclofenac sodium (VOLTAREN) 1 % GEL Apply 2 g topically 4 (four) times daily. 10/25/16   Avie Echevaria B, PA-C  hydrALAZINE (APRESOLINE) 100 MG tablet TAKE 1 TABLET BY MOUTH 3 TIMES DAILY. 08/25/16   Bensimhon, Shaune Pascal, MD  insulin aspart (NOVOLOG) 100 UNIT/ML injection Inject 6 Units into the skin daily with breakfast. 06/24/16   Zada Finders, MD  insulin glargine  (LANTUS) 100 UNIT/ML injection Inject 0.25 mLs (25 Units total) into the skin at bedtime. 04/14/16   Shirley Friar, PA-C  Insulin Syringe-Needle U-100 31G X 15/64" 0.3 ML MISC Use to inject insulin 4 times a day 07/27/16   Sid Falcon, MD  isosorbide mononitrate (IMDUR) 30 MG 24 hr tablet Take 3 tablets (90 mg total) by mouth daily. 09/14/16 12/13/16  Larey Dresser, MD  magnesium oxide (MAG-OX) 400 (241.3 Mg) MG tablet Take 1 tablet (400 mg total) by mouth daily. 04/15/16   Shirley Friar, PA-C  methocarbamol (ROBAXIN) 500 MG tablet Take 1 tablet (500 mg total) by mouth 2 (two) times daily. Patient not taking: Reported on 10/19/2016 10/07/16   Volanda Napoleon, PA-C  metolazone (ZAROXOLYN) 2.5 MG tablet Take 1 tablet (2.5 mg total) by mouth as needed. Take as directed 08/18/16   Larey Dresser, MD  potassium chloride SA (K-DUR,KLOR-CON) 20 MEQ tablet Take 2 tabs in AM and 1 tab in PM 09/14/16   Larey Dresser, MD  ranolazine (RANEXA) 500 MG 12 hr tablet Take 1 tablet (500 mg total) by mouth 2 (two) times daily. 09/14/16   Larey Dresser, MD  torsemide (DEMADEX) 20 MG tablet Take 3 tablets (60 mg total) by mouth daily. 09/28/16   Larey Dresser, MD  warfarin (COUMADIN) 5 MG tablet Take As Directed by Coumadin Clinic 08/18/16   Evans Lance, MD    Family History Family History  Problem Relation Age of Onset  . Hypertension Mother   . Heart disease Mother   . Diabetes Mother     Social History Social History  Substance Use Topics  . Smoking status: Former Smoker    Years: 10.00    Types: Cigars    Quit date: 07/28/2002  . Smokeless tobacco: Never Used  . Alcohol use No     Comment: 03/28/2016 "stopped drinking 4-5 months ago"     Allergies   No known allergies   Review of Systems Review of Systems  Constitutional: Negative for chills and fever.  Respiratory: Negative for cough, shortness of breath, wheezing and stridor.   Cardiovascular: Negative for chest pain,  palpitations and leg swelling.  Gastrointestinal: Negative for abdominal pain, nausea and vomiting.  Musculoskeletal: Positive for arthralgias. Negative for back pain, gait problem and myalgias.  Skin: Negative for color change, pallor, rash and wound.  Neurological: Negative for seizures, syncope, weakness and numbness.     Physical Exam Updated Vital Signs BP 118/71 (BP Location: Left Arm)   Pulse 76   Temp 98.5 F (36.9 C) (Oral)   Resp 16   SpO2 94%   Physical Exam  Constitutional: He appears well-developed and well-nourished. No distress.  Afebrile, nontoxic-appearing, sitting comfortably in bed in no acute distress.  HENT:  Head: Normocephalic and atraumatic.  Eyes: Conjunctivae are normal.  Neck: Neck supple.  Cardiovascular: Normal rate, regular rhythm, normal heart sounds and intact distal pulses.   No murmur heard. Strong  dorsalis pedis pulses.   Pulmonary/Chest: Effort normal and breath sounds normal. No respiratory distress. He has no wheezes. He has no rales.  Abdominal: He exhibits no distension.  Musculoskeletal: Normal range of motion. He exhibits no edema or deformity.  Inconsistent exam with tenderness to palpation of the dorsum of foot at times. Not while distracted. No swelling, erythema, warmth. Full range of motion.  Neurological: He is alert. No sensory deficit. He exhibits normal muscle tone.  5 out of 5 strength to plantar flexion dorsiflexion. Neurovascularly intact distally.  Skin: Skin is warm and dry. No rash noted. He is not diaphoretic. No erythema. No pallor.  Psychiatric: He has a normal mood and affect.  Nursing note and vitals reviewed.    ED Treatments / Results  Labs (all labs ordered are listed, but only abnormal results are displayed) Labs Reviewed  BASIC METABOLIC PANEL - Abnormal; Notable for the following:       Result Value   Glucose, Bld 210 (*)    BUN 23 (*)    Creatinine, Ser 2.86 (*)    GFR calc non Af Amer 25 (*)    GFR  calc Af Amer 29 (*)    All other components within normal limits  CBC - Abnormal; Notable for the following:    Hemoglobin 12.6 (*)    HCT 38.4 (*)    All other components within normal limits  CBG MONITORING, ED - Abnormal; Notable for the following:    Glucose-Capillary 211 (*)    All other components within normal limits  CBG MONITORING, ED    EKG  EKG Interpretation None       Radiology Dg Foot Complete Right  Result Date: 10/25/2016 CLINICAL DATA:  Swelling and pain around the fifth metatarsal EXAM: RIGHT FOOT COMPLETE - 3+ VIEW COMPARISON:  None. FINDINGS: There is no evidence of fracture or dislocation. There is no evidence of arthropathy or other focal bone abnormality. Soft tissues are unremarkable. Small plantar calcaneal spur IMPRESSION: No acute osseous abnormality Electronically Signed   By: Donavan Foil M.D.   On: 10/25/2016 18:08    Procedures Procedures (including critical care time)  Medications Ordered in ED Medications  acetaminophen (TYLENOL) tablet 650 mg (not administered)     Initial Impression / Assessment and Plan / ED Course  I have reviewed the triage vital signs and the nursing notes.  Pertinent labs & imaging results that were available during my care of the patient were reviewed by me and considered in my medical decision making (see chart for details).    Patient presenting with right foot pain on the dorsum of the foot. No evidence of infection, erythema, swelling, warmth, no fever or chills patient is well-appearing nontoxic with normal vital signs, stable my assessment.  Do not think that this is due to a gout flare. Inconsistent exam with no tenderness palpation while distracted.  Will discharge home with symptomatic relief and close follow-up with PCP as needed. He is ambulatory in ED.  Discussed strict return precautions and advised to return to the emergency department if experiencing any new or worsening symptoms. Instructions  were understood and patient agreed with discharge plan.  Final Clinical Impressions(s) / ED Diagnoses   Final diagnoses:  Foot pain, right    New Prescriptions New Prescriptions   DICLOFENAC SODIUM (VOLTAREN) 1 % GEL    Apply 2 g topically 4 (four) times daily.     Emeline General, PA-C 10/25/16 2311    Jacubowitz,  Sam, MD 10/26/16 8675

## 2016-10-25 NOTE — Discharge Instructions (Signed)
As discussed, use ice, elevate and wrap for support. Use the topical medicine and tylenol for pain and follow up with your primary care provider.  Your xray was normal today and your exam reassuring. It does not appear to be infected or Gout. Continue taking your medications as prescribed. Return if pain worsens, loss of sensation or other concerning symptoms in the meantime.

## 2016-10-26 ENCOUNTER — Telehealth (HOSPITAL_COMMUNITY): Payer: Self-pay | Admitting: *Deleted

## 2016-10-26 NOTE — Telephone Encounter (Signed)
Pt called to let us know he was seen in ER yesterday for foot pain and was given Voltaren gel, he wants to make sure this is ok to use w/his heart condition and other medications.  Per Abbe Amsterdam D ok for pt to use the gel, he is aware and agreeable

## 2016-11-01 ENCOUNTER — Other Ambulatory Visit: Payer: Self-pay | Admitting: Internal Medicine

## 2016-11-06 ENCOUNTER — Ambulatory Visit (INDEPENDENT_AMBULATORY_CARE_PROVIDER_SITE_OTHER): Payer: BLUE CROSS/BLUE SHIELD | Admitting: Pharmacist

## 2016-11-06 DIAGNOSIS — I481 Persistent atrial fibrillation: Secondary | ICD-10-CM | POA: Diagnosis not present

## 2016-11-06 DIAGNOSIS — Z5181 Encounter for therapeutic drug level monitoring: Secondary | ICD-10-CM

## 2016-11-06 DIAGNOSIS — I483 Typical atrial flutter: Secondary | ICD-10-CM

## 2016-11-06 DIAGNOSIS — Z95810 Presence of automatic (implantable) cardiac defibrillator: Secondary | ICD-10-CM | POA: Diagnosis not present

## 2016-11-06 DIAGNOSIS — I4819 Other persistent atrial fibrillation: Secondary | ICD-10-CM

## 2016-11-06 LAB — POCT INR: INR: 2.8

## 2016-11-10 ENCOUNTER — Telehealth (HOSPITAL_COMMUNITY): Payer: Self-pay

## 2016-11-10 NOTE — Telephone Encounter (Signed)
I called Mr. Guin to schedule an appointment for today and he advised that he was on his way to the hospital to see his mother. He stated she was very sick and was not expected to live through the next week. We did not reschedule at this time due to the nature of the cancellation. I will touch base with him next week.

## 2016-11-14 ENCOUNTER — Other Ambulatory Visit (HOSPITAL_COMMUNITY): Payer: Self-pay

## 2016-11-14 ENCOUNTER — Other Ambulatory Visit: Payer: Self-pay | Admitting: Internal Medicine

## 2016-11-14 ENCOUNTER — Telehealth (HOSPITAL_COMMUNITY): Payer: Self-pay | Admitting: Surgery

## 2016-11-14 MED FILL — hydrALAZINE HCL 100 MG TABS: 100 | 30 days supply | Qty: 90 | Fill #2

## 2016-11-14 MED FILL — RANEXA ER 500 MG TABLET: 500 | 30 days supply | Qty: 60 | Fill #1

## 2016-11-14 MED FILL — CARVEDILOL 12.5 MG TABLET: 12.5 | 30 days supply | Qty: 90 | Fill #2

## 2016-11-14 MED FILL — ALLOPURINOL 300 MG TABLET: 300 | 30 days supply | Qty: 30 | Fill #1

## 2016-11-14 MED FILL — TORSEMIDE 20 MG TABLET: 20 | 30 days supply | Qty: 105 | Fill #2

## 2016-11-14 MED FILL — AMLODIPINE BESYLATE 5 MG TA: 5 | 30 days supply | Qty: 30 | Fill #2

## 2016-11-14 MED FILL — AMIODARONE HCL 200 MG TAB: 200 | 30 days supply | Qty: 30 | Fill #5

## 2016-11-14 MED FILL — POTASSIUM CL ER 20 MEQ TABL: 20 | 30 days supply | Qty: 90 | Fill #2

## 2016-11-14 MED FILL — WARFARIN SODIUM 5 MG TABLET: 5 | 30 days supply | Qty: 45 | Fill #0

## 2016-11-14 NOTE — Progress Notes (Signed)
Paramedicine Encounter    Patient ID: Travis Bryan, male    DOB: 10-01-1970, 46 y.o.   MRN: [id number]   Patient Care Team: Zada Finders, MD as PCP - General (Internal Medicine)  Patient Active Problem List   Diagnosis Date Noted  . Healthcare maintenance 06/24/2016  . Persistent atrial fibrillation ([place])   . Chronic kidney disease, stage IV (severe) ([place])   . Typical atrial flutter ([place])   . CARDIOMYOPATHY, SECONDARY 07/03/2008  . Chronic systolic heart failure ([place]) 07/03/2008  . Automatic implantable cardioverter-defibrillator in situ 07/03/2008  . Type 2 diabetes mellitus ([place]) 12/26/2005  . Hyperlipidemia associated with type 2 diabetes mellitus ([place]) 12/26/2005  . Essential hypertension 12/26/2005    Current Outpatient Prescriptions:  .  allopurinol (ZYLOPRIM) 100 MG tablet, Take 100 mg by mouth daily., Disp: , Rfl:  .  amiodarone (PACERONE) 200 MG tablet, Take 200 mg by mouth daily., Disp: , Rfl:  .  amLODipine (NORVASC) 5 MG tablet, Take 1 tablet (5 mg total) by mouth daily., Disp: 30 tablet, Rfl: 6 .  atorvastatin (LIPITOR) 40 MG tablet, Take 1 tablet (40 mg total) by mouth daily., Disp: 90 tablet, Rfl: 3 .  carvedilol (COREG) 12.5 MG tablet, Take 1.5 tablets (18.75 mg total) by mouth 2 (two) times daily with a meal., Disp: 90 tablet, Rfl: 6 .  diclofenac sodium (VOLTAREN) 1 % GEL, Apply 2 g topically 4 (four) times daily., Disp: 1 Tube, Rfl: 0 .  hydrALAZINE (APRESOLINE) 100 MG tablet, TAKE 1 TABLET BY MOUTH 3 TIMES DAILY., Disp: 90 tablet, Rfl: 2 .  insulin aspart (NOVOLOG) 100 UNIT/ML injection, Inject 6 Units into the skin daily with breakfast., Disp: 10 mL, Rfl: 11 .  insulin glargine (LANTUS) 100 UNIT/ML injection, Inject 0.25 mLs (25 Units total) into the skin at bedtime., Disp: 10 mL, Rfl: 11 .  Insulin Syringe-Needle U-100 31G X 15/64" 0.3 ML MISC, Use to inject insulin 4 times a day, Disp: 200 each, Rfl: 12 .  isosorbide mononitrate (IMDUR) 30 MG 24 hr tablet,  Take 3 tablets (90 mg total) by mouth daily., Disp: 270 tablet, Rfl: 3 .  magnesium oxide (MAG-OX) 400 (241.3 Mg) MG tablet, Take 1 tablet (400 mg total) by mouth daily., Disp: 30 tablet, Rfl: 6 .  metolazone (ZAROXOLYN) 2.5 MG tablet, Take 1 tablet (2.5 mg total) by mouth as needed. Take as directed, Disp: 5 tablet, Rfl: 0 .  potassium chloride SA (K-DUR,KLOR-CON) 20 MEQ tablet, Take 2 tabs in AM and 1 tab in PM, Disp: 90 tablet, Rfl: 3 .  ranolazine (RANEXA) 500 MG 12 hr tablet, Take 1 tablet (500 mg total) by mouth 2 (two) times daily., Disp: 60 tablet, Rfl: 6 .  torsemide (DEMADEX) 20 MG tablet, Take 3 tablets (60 mg total) by mouth daily., Disp: 90 tablet, Rfl: 3 .  warfarin (COUMADIN) 5 MG tablet, Take As Directed by Coumadin Clinic, Disp: 45 tablet, Rfl: 2 .  methocarbamol (ROBAXIN) 500 MG tablet, Take 1 tablet (500 mg total) by mouth 2 (two) times daily. (Patient not taking: Reported on 10/19/2016), Disp: 10 tablet, Rfl: 0 Allergies  Allergen Reactions  . No Known Allergies      Social History   Social History  . Marital status: Widowed    Spouse name: N/A  . Number of children: N/A  . Years of education: N/A   Occupational History  . Not on file.   Social History Main Topics  . Smoking status: Former Smoker  Years: 10.00    Types: Cigars    Quit date: 07/28/2002  . Smokeless tobacco: Never Used  . Alcohol use No     Comment: 03/28/2016 "stopped drinking 4-5 months ago"  . Drug use: No  . Sexual activity: Not Currently   Other Topics Concern  . Not on file   Social History Narrative  . No narrative on file    Physical Exam  Constitutional: He is oriented to person, place, and time.  Cardiovascular: Normal rate and regular rhythm.   Pulmonary/Chest: Effort normal and breath sounds normal. No respiratory distress. He has no wheezes. He has no rales.  Abdominal: Soft.  Musculoskeletal: Normal range of motion. He exhibits no edema.  Neurological: He is alert and  oriented to person, place, and time.  Skin: Skin is warm and dry.  Psychiatric: He has a normal mood and affect.        Future Appointments Date Time Provider [place]  11/20/2016 11:15 AM [facility]  12/06/2016 1:15 PM Zada Finders, MD [facility]  12/14/2016 12:20 PM [facility] DEVICE REMOTES [facility]  12/25/2016 11:00 AM [facility] PA/NP [facility] None   BP (!) 142/108 (BP Location: Right Arm, Patient Position: Sitting, Cuff Size: Normal)   Pulse 70   Resp 18   Wt 233 lb 9.6 oz (106 kg)   SpO2 94%   BMI 33.52 kg/m  Weight yesterday- did not weigh Last visit weight- 236 lbs  Mr Fleer was seen at home today and reports feeling well. He denied SOB, headache, dizziness and orthopnea. He reports being compliant with his medications but has not been weighing regularly. His mother is currently in hospice care at [facility] which has kept him preoccupied. He had taken his medications today just prior to my arrival so it was not possible to judge their efficacy with regards to his hypertension. I will visit next week, later in the day to see if a change in amlodipine is necessary.  Time spent with patient: 33 minutes   Jacquiline Doe, EMT 11/14/16  ACTION: Home visit completed Next visit planned for 1 week

## 2016-11-14 NOTE — Telephone Encounter (Signed)
[place] Paramedic called to inform that the patient is currently taking Allopurinol 300 mg tablet once daily.  Medication list has been updated to include this dosage.

## 2016-11-20 ENCOUNTER — Ambulatory Visit (INDEPENDENT_AMBULATORY_CARE_PROVIDER_SITE_OTHER): Payer: BLUE CROSS/BLUE SHIELD | Admitting: *Deleted

## 2016-11-20 DIAGNOSIS — Z95810 Presence of automatic (implantable) cardiac defibrillator: Secondary | ICD-10-CM

## 2016-11-20 DIAGNOSIS — I4819 Other persistent atrial fibrillation: Secondary | ICD-10-CM

## 2016-11-20 DIAGNOSIS — I483 Typical atrial flutter: Secondary | ICD-10-CM | POA: Diagnosis not present

## 2016-11-20 DIAGNOSIS — Z5181 Encounter for therapeutic drug level monitoring: Secondary | ICD-10-CM

## 2016-11-20 DIAGNOSIS — I481 Persistent atrial fibrillation: Secondary | ICD-10-CM | POA: Diagnosis not present

## 2016-11-20 LAB — POCT INR: INR: 2.8

## 2016-11-30 ENCOUNTER — Encounter: Payer: Self-pay | Admitting: Internal Medicine

## 2016-11-30 ENCOUNTER — Telehealth (HOSPITAL_COMMUNITY): Payer: Self-pay

## 2016-11-30 NOTE — Telephone Encounter (Signed)
I called Travis Bryan to make an appointment and he did not answer. I left a voicemail for him to call me back. I will call again in the morning.

## 2016-12-01 ENCOUNTER — Other Ambulatory Visit (HOSPITAL_COMMUNITY): Payer: Self-pay

## 2016-12-01 MED FILL — ISOSORBIDE MN ER 30 MG TAB: 30 | 90 days supply | Qty: 270 | Fill #1

## 2016-12-01 NOTE — Progress Notes (Signed)
Paramedicine Encounter    Patient ID: Eduardo Honor, male    DOB: Jul 15, 1970, 46 y.o.   MRN: [id number]   Patient Care Team: Zada Finders, MD as PCP - General (Internal Medicine)  Patient Active Problem List   Diagnosis Date Noted  . Healthcare maintenance 06/24/2016  . Persistent atrial fibrillation ([place])   . Chronic kidney disease, stage IV (severe) ([place])   . Typical atrial flutter ([place])   . CARDIOMYOPATHY, SECONDARY 07/03/2008  . Chronic systolic heart failure ([place]) 07/03/2008  . Automatic implantable cardioverter-defibrillator in situ 07/03/2008  . Type 2 diabetes mellitus ([place]) 12/26/2005  . Hyperlipidemia associated with type 2 diabetes mellitus ([place]) 12/26/2005  . Essential hypertension 12/26/2005    Current Outpatient Prescriptions:  .  allopurinol (ZYLOPRIM) 300 MG tablet, Take 300 mg by mouth daily. , Disp: , Rfl:  .  amiodarone (PACERONE) 200 MG tablet, Take 200 mg by mouth daily., Disp: , Rfl:  .  amLODipine (NORVASC) 5 MG tablet, Take 1 tablet (5 mg total) by mouth daily., Disp: 30 tablet, Rfl: 6 .  atorvastatin (LIPITOR) 40 MG tablet, Take 1 tablet (40 mg total) by mouth daily., Disp: 90 tablet, Rfl: 3 .  carvedilol (COREG) 12.5 MG tablet, Take 1.5 tablets (18.75 mg total) by mouth 2 (two) times daily with a meal., Disp: 90 tablet, Rfl: 6 .  hydrALAZINE (APRESOLINE) 100 MG tablet, TAKE 1 TABLET BY MOUTH 3 TIMES DAILY., Disp: 90 tablet, Rfl: 2 .  insulin aspart (NOVOLOG) 100 UNIT/ML injection, Inject 6 Units into the skin daily with breakfast., Disp: 10 mL, Rfl: 11 .  insulin glargine (LANTUS) 100 UNIT/ML injection, Inject 0.25 mLs (25 Units total) into the skin at bedtime., Disp: 10 mL, Rfl: 11 .  Insulin Syringe-Needle U-100 31G X 15/64" 0.3 ML MISC, Use to inject insulin 4 times a day, Disp: 200 each, Rfl: 12 .  isosorbide mononitrate (IMDUR) 30 MG 24 hr tablet, Take 3 tablets (90 mg total) by mouth daily., Disp: 270 tablet, Rfl: 3 .  magnesium oxide (MAG-OX) 400  (241.3 Mg) MG tablet, Take 1 tablet (400 mg total) by mouth daily., Disp: 30 tablet, Rfl: 6 .  metolazone (ZAROXOLYN) 2.5 MG tablet, Take 1 tablet (2.5 mg total) by mouth as needed. Take as directed, Disp: 5 tablet, Rfl: 0 .  potassium chloride SA (K-DUR,KLOR-CON) 20 MEQ tablet, Take 2 tabs in AM and 1 tab in PM, Disp: 90 tablet, Rfl: 3 .  ranolazine (RANEXA) 500 MG 12 hr tablet, Take 1 tablet (500 mg total) by mouth 2 (two) times daily., Disp: 60 tablet, Rfl: 6 .  torsemide (DEMADEX) 20 MG tablet, Take 3 tablets (60 mg total) by mouth daily., Disp: 90 tablet, Rfl: 3 .  warfarin (COUMADIN) 5 MG tablet, TAKE AS DIRECTED BY COUMADIN CLINIC, Disp: 45 tablet, Rfl: 3 .  diclofenac sodium (VOLTAREN) 1 % GEL, Apply 2 g topically 4 (four) times daily. (Patient not taking: Reported on 12/01/2016), Disp: 1 Tube, Rfl: 0 .  methocarbamol (ROBAXIN) 500 MG tablet, Take 1 tablet (500 mg total) by mouth 2 (two) times daily. (Patient not taking: Reported on 10/19/2016), Disp: 10 tablet, Rfl: 0 Allergies  Allergen Reactions  . No Known Allergies      Social History   Social History  . Marital status: Widowed    Spouse name: N/A  . Number of children: N/A  . Years of education: N/A   Occupational History  . Not on file.   Social History Main Topics  .  Smoking status: Former Smoker    Years: 10.00    Types: Cigars    Quit date: 07/28/2002  . Smokeless tobacco: Never Used  . Alcohol use No     Comment: 03/28/2016 "stopped drinking 4-5 months ago"  . Drug use: No  . Sexual activity: Not Currently   Other Topics Concern  . Not on file   Social History Narrative  . No narrative on file    Physical Exam  Constitutional: He is oriented to person, place, and time.  Cardiovascular: Normal rate and regular rhythm.   Pulmonary/Chest: Effort normal and breath sounds normal.  Abdominal: Soft.  Musculoskeletal: Normal range of motion. He exhibits no edema.  Neurological: He is alert and oriented to  person, place, and time.  Skin: Skin is warm and dry.  Psychiatric: He has a normal mood and affect.        Future Appointments Date Time Provider [place]  12/06/2016 1:15 PM Zada Finders, MD [facility]  12/11/2016 10:15 AM [facility]  12/14/2016 12:20 PM [facility] DEVICE REMOTES [facility]  12/25/2016 11:00 AM [facility] PA/NP [facility] None   BP (!) 130/100 (BP Location: Right Arm, Patient Position: Sitting, Cuff Size: Normal)   Pulse 64   Resp 16   Wt 237 lb (107.5 kg)   SpO2 94%   BMI 34.01 kg/m  Weight yesterday- Did not weigh Last visit weight- 233.6 lbs  Mr Selman was seen at home today and reports feeling well. He denied SOB, dizziness, headache or orthopnea. He reports being compliant with all medications. He was hypertensive this morning but did not take his medications until I arrived. He is not weighing daily despite me telling him the weight everyday on multiple occasions. I did not press the issue much this week because his mother passed away and they had the funeral on Tuesday. He did get an automated blood pressure cuff that belonged to his mother and I set it up and tested it for him. I encouraged him to use it daily and record it on a chart. Even if he does not record them, the cuff has a memory feature that saves the last several readings. All medications were verified and his pillbox was refilled. Necessary medication refills were ordered as well.   Time spent with patient: 57 minutes  Jacquiline Doe, EMT 12/01/16  ACTION: Home visit completed Next visit planned for 1 week

## 2016-12-06 ENCOUNTER — Encounter: Payer: Self-pay | Admitting: Internal Medicine

## 2016-12-06 ENCOUNTER — Ambulatory Visit (INDEPENDENT_AMBULATORY_CARE_PROVIDER_SITE_OTHER): Payer: BLUE CROSS/BLUE SHIELD | Admitting: Internal Medicine

## 2016-12-06 ENCOUNTER — Encounter (INDEPENDENT_AMBULATORY_CARE_PROVIDER_SITE_OTHER): Payer: Self-pay

## 2016-12-06 VITALS — BP 120/76 | HR 66 | Temp 98.1°F | Ht 70.0 in | Wt 238.9 lb

## 2016-12-06 DIAGNOSIS — I4819 Other persistent atrial fibrillation: Secondary | ICD-10-CM

## 2016-12-06 DIAGNOSIS — N189 Chronic kidney disease, unspecified: Secondary | ICD-10-CM

## 2016-12-06 DIAGNOSIS — E1122 Type 2 diabetes mellitus with diabetic chronic kidney disease: Secondary | ICD-10-CM

## 2016-12-06 DIAGNOSIS — Z95810 Presence of automatic (implantable) cardiac defibrillator: Secondary | ICD-10-CM | POA: Diagnosis not present

## 2016-12-06 DIAGNOSIS — Z7901 Long term (current) use of anticoagulants: Secondary | ICD-10-CM | POA: Diagnosis not present

## 2016-12-06 DIAGNOSIS — Z794 Long term (current) use of insulin: Secondary | ICD-10-CM | POA: Diagnosis not present

## 2016-12-06 DIAGNOSIS — I481 Persistent atrial fibrillation: Secondary | ICD-10-CM | POA: Diagnosis not present

## 2016-12-06 DIAGNOSIS — E785 Hyperlipidemia, unspecified: Secondary | ICD-10-CM | POA: Diagnosis not present

## 2016-12-06 DIAGNOSIS — I10 Essential (primary) hypertension: Secondary | ICD-10-CM

## 2016-12-06 DIAGNOSIS — I5022 Chronic systolic (congestive) heart failure: Secondary | ICD-10-CM

## 2016-12-06 DIAGNOSIS — I130 Hypertensive heart and chronic kidney disease with heart failure and stage 1 through stage 4 chronic kidney disease, or unspecified chronic kidney disease: Secondary | ICD-10-CM | POA: Diagnosis not present

## 2016-12-06 DIAGNOSIS — Z0000 Encounter for general adult medical examination without abnormal findings: Secondary | ICD-10-CM

## 2016-12-06 DIAGNOSIS — I428 Other cardiomyopathies: Secondary | ICD-10-CM | POA: Diagnosis not present

## 2016-12-06 DIAGNOSIS — E118 Type 2 diabetes mellitus with unspecified complications: Secondary | ICD-10-CM

## 2016-12-06 DIAGNOSIS — Z79899 Other long term (current) drug therapy: Secondary | ICD-10-CM

## 2016-12-06 LAB — POCT GLYCOSYLATED HEMOGLOBIN (HGB A1C): Hemoglobin A1C: 6.4

## 2016-12-06 LAB — GLUCOSE, CAPILLARY: GLUCOSE-CAPILLARY: 125 mg/dL — AB (ref 65–99)

## 2016-12-06 MED ORDER — INSULIN GLARGINE 100 UNIT/ML ~~LOC~~ SOLN
20.0000 [IU] | Freq: Every day | SUBCUTANEOUS | 11 refills | Status: DC
Start: 2016-12-06 — End: 2017-08-23

## 2016-12-06 NOTE — Patient Instructions (Signed)
It was a pleasure to see you again Travis Bryan.  Your Hemoglobin A1c today is 6.4%. Your diabetes is well-controlled.  Please continue Lantus 20 units at night and Novolog 6 units with breakfast.  We can consider adding a medication for your diabetes called Januvia (sitagliptin) and come down further on your insulin.  Please continue your other medications as prescribed.  Please consider obtaining the flu and pneumonia vaccines. Call our clinic if you change your mind.  Follow up with me in 3 months or sooner if needed.   Sitagliptin oral tablet What is this medicine? SITAGLIPTIN (sit a GLIP tin) helps to treat type 2 diabetes. It helps to control blood sugar. Treatment is combined with diet and exercise. This medicine may be used for other purposes; ask your health care provider or pharmacist if you have questions. COMMON BRAND NAME(S): Januvia What should I tell my health care provider before I take this medicine? They need to know if you have any of these conditions: -diabetic ketoacidosis -kidney disease -pancreatitis -previous swelling of the tongue, face, or lips with difficulty breathing, difficulty swallowing, hoarseness, or tightening of the throat -type 1 diabetes -an unusual or allergic reaction to sitagliptin, other medicines, foods, dyes, or preservatives -pregnant or trying to get pregnant -breast-feeding How should I use this medicine? Take this medicine by mouth with a glass of water. Follow the directions on the prescription label. You can take it with or without food. Do not cut, crush or chew this medicine. Take your dose at the same time each day. Do not take more often than directed. Do not stop taking except on your doctor's advice. A special MedGuide will be given to you by the pharmacist with each prescription and refill. Be sure to read this information carefully each time. Talk to your pediatrician regarding the use of this medicine in children. Special care  may be needed. Overdosage: If you think you have taken too much of this medicine contact a poison control center or emergency room at once. NOTE: This medicine is only for you. Do not share this medicine with others. What if I miss a dose? If you miss a dose, take it as soon as you can. If it is almost time for your next dose, take only that dose. Do not take double or extra doses. What may interact with this medicine? Do not take this medicine with any of the following medications: -gatifloxacin This medicine may also interact with the following medications: -alcohol -digoxin -insulin -sulfonylureas like glimepiride, glipizide, glyburide This list may not describe all possible interactions. Give your health care provider a list of all the medicines, herbs, non-prescription drugs, or dietary supplements you use. Also tell them if you smoke, drink alcohol, or use illegal drugs. Some items may interact with your medicine. What should I watch for while using this medicine? Visit your doctor or health care professional for regular checks on your progress. A test called the HbA1C (A1C) will be monitored. This is a simple blood test. It measures your blood sugar control over the last 2 to 3 months. You will receive this test every 3 to 6 months. Learn how to check your blood sugar. Learn the symptoms of low and high blood sugar and how to manage them. Always carry a quick-source of sugar with you in case you have symptoms of low blood sugar. Examples include hard sugar candy or glucose tablets. Make sure others know that you can choke if you eat or drink  when you develop serious symptoms of low blood sugar, such as seizures or unconsciousness. They must get medical help at once. Tell your doctor or health care professional if you have high blood sugar. You might need to change the dose of your medicine. If you are sick or exercising more than usual, you might need to change the dose of your  medicine. Do not skip meals. Ask your doctor or health care professional if you should avoid alcohol. Many nonprescription cough and cold products contain sugar or alcohol. These can affect blood sugar. Wear a medical ID bracelet or chain, and carry a card that describes your disease and details of your medicine and dosage times. What side effects may I notice from receiving this medicine? Side effects that you should report to your doctor or health care professional as soon as possible: -allergic reactions like skin rash, itching or hives, swelling of the face, lips, or tongue -breathing problems -general ill feeling or flu-like symptoms -joint pain -loss of appetite -redness, blistering, peeling or loosening of the skin, including inside the mouth -signs and symptoms of low blood sugar such as feeling anxious, confusion, dizziness, increased hunger, unusually weak or tired, sweating, shakiness, cold, irritable, headache, blurred vision, fast heartbeat, loss of consciousness -unusual stomach pain or discomfort -vomiting Side effects that usually do not require medical attention (report to your doctor or health care professional if they continue or are bothersome): -diarrhea -headache -sore throat -stomach upset -stuffy or runny nose This list may not describe all possible side effects. Call your doctor for medical advice about side effects. You may report side effects to FDA at 1-800-FDA-1088. Where should I keep my medicine? Keep out of the reach of children. Store at room temperature between 15 and 30 degrees C (59 and 86 degrees F). Throw away any unused medicine after the expiration date. NOTE: This sheet is a summary. It may not cover all possible information. If you have questions about this medicine, talk to your doctor, pharmacist, or health care provider.  2018 Elsevier/Gold Standard (2015-08-27 14:23:55)

## 2016-12-06 NOTE — Progress Notes (Signed)
CC: Diabetes  HPI:  [name] is a 46 y.o. male with PMH as listed below including Chronic systolic CHF(EF 42% in 09/621), NICM, Afib/Aflutter s/p ablation, ICD placement, HTN, CKD, T2DM, and HLD who presents for follow up management of his T2DM. Please see problem list for status of patient's chronic medical issues.  T2DM: Last A1c was 6.0 on 09/06/16. Patient is currently prescribed Lantus 22 units qhs (he is taking 21 units) and Novolog 6 units once daily with breakfast. He says he checks his blood sugars at breakfast with most readings between 80-124. He denies feeling any hyperglycemic or hypoglycemic symptoms (he is on a beta blocker).   Chronic Systolic CHF s/p ICD placement: Patient is following closely with the Heart Failure team (Dr. Aundra Bryan) and EP (Dr. Lovena Bryan). Repeat TTE on 09/11/2016 showed an EF of 25-30%, diffuse hypokinesis, grade 1 diastolic dysfunction. He is currently taking Torsemide 60 mg daily, Coreg 18.75 mg BID, Hydralazine 100 mg TID, Imdur 90 mg daily, Ranolazine 500 mg BID and Metolazone 2.5 mg prn (has not needed to use). He denies any chest pain, palpitations, shortness of breath, orthopnea, or peripheral edema. He occasionally checks his weight at home and reports a normal weight of 236 lbs. His weight in clinic is 238 lbs.  HTN: Patient currently takes Amlodipine 5 mg daily in addition to his CHF medications listed above. BP is 126/76 this visit.  Atrial Fibrillation: He is s/p DCCV on 04/11/16 with return to NSR. He currently takes Amiodarone for rhythm control and is on Coumadin for anticoagulation. He denies any obvious bleeding.  Healthcare Maintenance: Patient is eligible for the influenza and PPSV23 vaccine. He was referred to ophthalmology for annual diabetic retinopathy screening, however he has not scheduled his appointment.    Past Medical History:  Diagnosis Date  . AICD (automatic cardioverter/defibrillator) present   . Atrial flutter ([place])     a. s/p ablation 03/2016  . Chronic systolic CHF (congestive heart failure) ([place])   . History of gout   . Hyperlipidemia   . Hypertension   . Myocardial infarction [facility])    "I think I had one a long long time ago" (03/28/2016)  . NICM (nonischemic cardiomyopathy) ([place])    a. LHC 6/06: pLAD 20, pLCx 20-30; b. Echo 5/15:  EF 15%, diffuse HK, restrictive physiology, trivial AI, trivial MR, mild LAE, moderate RVE, moderately reduced RVSF, moderate RAE, mild to moderate TR, PASP 43 mmHg  . Obesity   . Persistent atrial fibrillation ([place])   . Type II diabetes mellitus ([place])    Review of Systems:   Review of Systems  Constitutional: Negative for chills, diaphoresis, fever and weight loss.  HENT: Negative for nosebleeds.   Respiratory: Negative for cough, hemoptysis and shortness of breath.   Cardiovascular: Negative for chest pain, palpitations, orthopnea, leg swelling and PND.  Gastrointestinal: Negative for blood in stool and melena.  Genitourinary: Negative for dysuria and hematuria.  Endo/Heme/Allergies: Does not bruise/bleed easily.     Physical Exam:  Vitals:   12/06/16 1320  BP: 120/76  Pulse: 66  Temp: 98.1 F (36.7 C)  TempSrc: Oral  SpO2: 97%  Weight: 238 lb 14.4 oz (108.4 kg)  Height: 5\' 10"  (1.778 m)   Physical Exam  Constitutional: He is oriented to person, place, and time. He appears well-developed and well-nourished. No distress.  HENT:  Head: Normocephalic and atraumatic.  Cardiovascular: Normal rate and regular rhythm.   No murmur heard. Pulmonary/Chest: Effort normal. No respiratory distress.  He has no wheezes. He has no rales.  Musculoskeletal: He exhibits no edema.  Neurological: He is alert and oriented to person, place, and time.  Skin: Skin is warm. He is not diaphoretic.    Assessment & Plan:   See Encounters Tab for problem based charting.  Patient discussed with Dr. Dareen Bryan   Type 2 diabetes mellitus ([place]) A1c this visit is 6.4. His  diabetic control is stable and at goal. We discussed possibly adding Sitagliptin to allow to come down further on his insulin, however he wants to defer at this time. He says he is thinking about joining a gym with his coworkers. He says he is eating consistently, three meals per day. - Lantus 20 units qhs - Continue Novolog 6 units with breakfast - Repeat A1c in 3 months - Encouraged to begin exercise program as tolerated - Advised to follow up with Ophthalmology  Chronic systolic heart failure ([place]) Patient's heart failure is currently stable. Will continue current management and he will continue to follow up with Cardiology as scheduled. - Continue Torsemide 60 mg daily, Coreg 18.75 mg BID, Hydralazine 100 mg TID, Imdur 90 mg daily, Ranolazine 500 mg BID and Metolazone 2.5 mg prn  Essential hypertension BP Readings from Last 3 Encounters:  12/08/16 110/76  12/06/16 120/76  12/01/16 (!) 130/100   BP well-controlled on current heart failure medications and Amlodipine. - Continue Amlodipine 5 mg daily, Torsemide 60 mg daily, Coreg 18.75 mg BID, Hydralazine 100 mg TID, Imdur 90 mg daily, Ranolazine 500 mg BID and Metolazone 2.5 mg prn   Persistent atrial fibrillation ([facility]) Patient with regular rhythm on exam. He is tolerating Coumadin well without any obvious bleeding. - Continue Amiodarone 200 mg daily - Continue Coumadin; monitor for signs/symptoms of bleeding  Healthcare maintenance Patient declined flu and pneumonia vaccines.

## 2016-12-08 ENCOUNTER — Other Ambulatory Visit (HOSPITAL_COMMUNITY): Payer: Self-pay

## 2016-12-08 MED FILL — ATORVASTATIN 40 MG TABLET: 40 | 90 days supply | Qty: 90 | Fill #1

## 2016-12-08 MED FILL — RANEXA ER 500 MG TABLET: 500 | 30 days supply | Qty: 60 | Fill #2

## 2016-12-08 MED FILL — AMLODIPINE BESYLATE 5 MG TA: 5 | 30 days supply | Qty: 30 | Fill #3

## 2016-12-08 MED FILL — POTASSIUM CL ER 20 MEQ TABL: 20 | 30 days supply | Qty: 90 | Fill #3

## 2016-12-08 MED FILL — CARVEDILOL 12.5 MG TABLET: 12.5 | 30 days supply | Qty: 90 | Fill #3

## 2016-12-08 NOTE — Assessment & Plan Note (Signed)
Patient declined flu and pneumonia vaccines.

## 2016-12-08 NOTE — Assessment & Plan Note (Signed)
A1c this visit is 6.4. His diabetic control is stable and at goal. We discussed possibly adding Sitagliptin to allow to come down further on his insulin, however he wants to defer at this time. He says he is thinking about joining a gym with his coworkers. He says he is eating consistently, three meals per day. - Lantus 20 units qhs - Continue Novolog 6 units with breakfast - Repeat A1c in 3 months - Encouraged to begin exercise program as tolerated - Advised to follow up with Ophthalmology

## 2016-12-08 NOTE — Progress Notes (Signed)
Paramedicine Encounter    Patient ID: Travis Bryan, male    DOB: [birth year], 46 y.o.   MRN: [id number]   Patient Care Team: Zada Finders, MD as PCP - General (Internal Medicine)  Patient Active Problem List   Diagnosis Date Noted  . Healthcare maintenance 06/24/2016  . Persistent atrial fibrillation ([place])   . Chronic kidney disease, stage IV (severe) ([place])   . Typical atrial flutter ([place])   . CARDIOMYOPATHY, SECONDARY 07/03/2008  . Chronic systolic heart failure ([place]) 07/03/2008  . Automatic implantable cardioverter-defibrillator in situ 07/03/2008  . Type 2 diabetes mellitus ([place]) 12/26/2005  . Hyperlipidemia associated with type 2 diabetes mellitus ([place]) 12/26/2005  . Essential hypertension 12/26/2005    Current Outpatient Prescriptions:  .  allopurinol (ZYLOPRIM) 300 MG tablet, Take 300 mg by mouth daily. , Disp: , Rfl:  .  amiodarone (PACERONE) 200 MG tablet, Take 200 mg by mouth daily., Disp: , Rfl:  .  amLODipine (NORVASC) 5 MG tablet, Take 1 tablet (5 mg total) by mouth daily., Disp: 30 tablet, Rfl: 6 .  atorvastatin (LIPITOR) 40 MG tablet, Take 1 tablet (40 mg total) by mouth daily., Disp: 90 tablet, Rfl: 3 .  carvedilol (COREG) 12.5 MG tablet, Take 1.5 tablets (18.75 mg total) by mouth 2 (two) times daily with a meal., Disp: 90 tablet, Rfl: 6 .  hydrALAZINE (APRESOLINE) 100 MG tablet, TAKE 1 TABLET BY MOUTH 3 TIMES DAILY., Disp: 90 tablet, Rfl: 2 .  insulin aspart (NOVOLOG) 100 UNIT/ML injection, Inject 6 Units into the skin daily with breakfast., Disp: 10 mL, Rfl: 11 .  insulin glargine (LANTUS) 100 UNIT/ML injection, Inject 0.2 mLs (20 Units total) into the skin at bedtime., Disp: 10 mL, Rfl: 11 .  Insulin Syringe-Needle U-100 31G X 15/64" 0.3 ML MISC, Use to inject insulin 4 times a day, Disp: 200 each, Rfl: 12 .  isosorbide mononitrate (IMDUR) 30 MG 24 hr tablet, Take 3 tablets (90 mg total) by mouth daily., Disp: 270 tablet, Rfl: 3 .  magnesium oxide (MAG-OX) 400  (241.3 Mg) MG tablet, Take 1 tablet (400 mg total) by mouth daily., Disp: 30 tablet, Rfl: 6 .  metolazone (ZAROXOLYN) 2.5 MG tablet, Take 1 tablet (2.5 mg total) by mouth as needed. Take as directed, Disp: 5 tablet, Rfl: 0 .  potassium chloride SA (K-DUR,KLOR-CON) 20 MEQ tablet, Take 2 tabs in AM and 1 tab in PM, Disp: 90 tablet, Rfl: 3 .  ranolazine (RANEXA) 500 MG 12 hr tablet, Take 1 tablet (500 mg total) by mouth 2 (two) times daily., Disp: 60 tablet, Rfl: 6 .  torsemide (DEMADEX) 20 MG tablet, Take 3 tablets (60 mg total) by mouth daily., Disp: 90 tablet, Rfl: 3 .  warfarin (COUMADIN) 5 MG tablet, TAKE AS DIRECTED BY COUMADIN CLINIC, Disp: 45 tablet, Rfl: 3 .  diclofenac sodium (VOLTAREN) 1 % GEL, Apply 2 g topically 4 (four) times daily. (Patient not taking: Reported on 12/01/2016), Disp: 1 Tube, Rfl: 0 .  methocarbamol (ROBAXIN) 500 MG tablet, Take 1 tablet (500 mg total) by mouth 2 (two) times daily. (Patient not taking: Reported on 10/19/2016), Disp: 10 tablet, Rfl: 0 Allergies  Allergen Reactions  . No Known Allergies      Social History   Social History  . Marital status: Widowed    Spouse name: N/A  . Number of children: N/A  . Years of education: N/A   Occupational History  . Not on file.   Social History Main Topics  .  Smoking status: Former Smoker    Years: 10.00    Types: Cigars    Quit date: 07/28/2002  . Smokeless tobacco: Never Used  . Alcohol use No     Comment: 03/28/2016 "stopped drinking 4-5 months ago"  . Drug use: No  . Sexual activity: Not Currently   Other Topics Concern  . Not on file   Social History Narrative  . No narrative on file    Physical Exam  Constitutional: He is oriented to person, place, and time.  Cardiovascular: Normal rate and regular rhythm.   Pulmonary/Chest: Effort normal and breath sounds normal. No respiratory distress. He has no wheezes. He has no rales.  Abdominal: Soft.  Musculoskeletal: Normal range of motion. He exhibits  no edema.  Neurological: He is alert and oriented to person, place, and time.  Skin: Skin is warm and dry.  Psychiatric: He has a normal mood and affect.        Future Appointments Date Time Provider [place]  12/11/2016 10:15 AM [facility]  12/14/2016 12:20 PM [facility] DEVICE REMOTES [facility]  12/25/2016 11:00 AM [facility] PA/NP [facility] None  03/14/2017 1:15 PM Zada Finders, MD [facility]   BP 110/76 (BP Location: Right Arm, Patient Position: Sitting, Cuff Size: Normal)   Pulse 64   Resp 16   Wt 235 lb 9.6 oz (106.9 kg)   SpO2 97%   BMI 33.81 kg/m  Weight yesterday- did not weigh Last visit weight- 237 lb  Travis Bryan was seen at home today and repoerts feeling well. He denied SOB, dizziness, headache or orthopnea. He reports being compliant with all his medications. Over the last several weeks I have been trying to get him to take his medications at least an hour before I arrived to see how well his BP is controlled. Today I called and reminded him to take his medicine about one hour before I showed up. His BP was well controlled and he stated the automated cuff he has been using has bee consistent with today's reading. He still does not weigh himself daily and offers no explanation for why and when I asked what I could do to help him remember to weigh and record daily he shrugged his shoulders. His medications were verified and his pillbox was refilled according to Epic. Refills x 5 were called in for him and will be ready for pickup this afternoon.   Time spent with patient: 30 minutes  Jacquiline Doe, EMT 12/08/16  ACTION: Home visit completed Next visit planned for 1 week

## 2016-12-08 NOTE — Assessment & Plan Note (Signed)
BP Readings from Last 3 Encounters:  12/08/16 110/76  12/06/16 120/76  12/01/16 (!) 130/100   BP well-controlled on current heart failure medications and Amlodipine. - Continue Amlodipine 5 mg daily, Torsemide 60 mg daily, Coreg 18.75 mg BID, Hydralazine 100 mg TID, Imdur 90 mg daily, Ranolazine 500 mg BID and Metolazone 2.5 mg prn

## 2016-12-08 NOTE — Assessment & Plan Note (Signed)
Patient's heart failure is currently stable. Will continue current management and he will continue to follow up with Cardiology as scheduled. - Continue Torsemide 60 mg daily, Coreg 18.75 mg BID, Hydralazine 100 mg TID, Imdur 90 mg daily, Ranolazine 500 mg BID and Metolazone 2.5 mg prn

## 2016-12-08 NOTE — Assessment & Plan Note (Signed)
Patient with regular rhythm on exam. He is tolerating Coumadin well without any obvious bleeding. - Continue Amiodarone 200 mg daily - Continue Coumadin; monitor for signs/symptoms of bleeding

## 2016-12-11 ENCOUNTER — Encounter (INDEPENDENT_AMBULATORY_CARE_PROVIDER_SITE_OTHER): Payer: Self-pay

## 2016-12-11 ENCOUNTER — Ambulatory Visit (INDEPENDENT_AMBULATORY_CARE_PROVIDER_SITE_OTHER): Payer: BLUE CROSS/BLUE SHIELD | Admitting: *Deleted

## 2016-12-11 DIAGNOSIS — Z95810 Presence of automatic (implantable) cardiac defibrillator: Secondary | ICD-10-CM

## 2016-12-11 DIAGNOSIS — I483 Typical atrial flutter: Secondary | ICD-10-CM | POA: Diagnosis not present

## 2016-12-11 DIAGNOSIS — I4819 Other persistent atrial fibrillation: Secondary | ICD-10-CM

## 2016-12-11 DIAGNOSIS — I481 Persistent atrial fibrillation: Secondary | ICD-10-CM | POA: Diagnosis not present

## 2016-12-11 DIAGNOSIS — Z5181 Encounter for therapeutic drug level monitoring: Secondary | ICD-10-CM

## 2016-12-11 LAB — POCT INR: INR: 2.2

## 2016-12-12 NOTE — Progress Notes (Signed)
Internal Medicine Clinic Attending  Case discussed with Dr. Patel soon after the resident saw the patient.  We reviewed the resident's history and exam and pertinent patient test results.  I agree with the assessment, diagnosis, and plan of care documented in the resident's note. 

## 2016-12-14 ENCOUNTER — Other Ambulatory Visit (HOSPITAL_COMMUNITY): Payer: Self-pay

## 2016-12-14 ENCOUNTER — Telehealth: Payer: Self-pay | Admitting: Cardiology

## 2016-12-14 ENCOUNTER — Other Ambulatory Visit (HOSPITAL_COMMUNITY): Payer: Self-pay | Admitting: Cardiology

## 2016-12-14 ENCOUNTER — Ambulatory Visit (INDEPENDENT_AMBULATORY_CARE_PROVIDER_SITE_OTHER): Payer: BLUE CROSS/BLUE SHIELD | Admitting: *Deleted

## 2016-12-14 DIAGNOSIS — I428 Other cardiomyopathies: Secondary | ICD-10-CM

## 2016-12-14 DIAGNOSIS — I5022 Chronic systolic (congestive) heart failure: Secondary | ICD-10-CM

## 2016-12-14 MED FILL — AMIODARONE HCL 200 MG TAB: 200 | 30 days supply | Qty: 30 | Fill #6

## 2016-12-14 NOTE — Progress Notes (Signed)
Paramedicine Encounter    Patient ID: Naoki Migliaccio, male    DOB: [birth year], 46 y.o.   MRN: [id number]   Patient Care Team: Zada Finders, MD as PCP - General (Internal Medicine)  Patient Active Problem List   Diagnosis Date Noted  . Healthcare maintenance 06/24/2016  . Persistent atrial fibrillation ([place])   . Chronic kidney disease, stage IV (severe) ([place])   . Typical atrial flutter ([place])   . CARDIOMYOPATHY, SECONDARY 07/03/2008  . Chronic systolic heart failure ([place]) 07/03/2008  . Automatic implantable cardioverter-defibrillator in situ 07/03/2008  . Type 2 diabetes mellitus ([place]) 12/26/2005  . Hyperlipidemia associated with type 2 diabetes mellitus ([place]) 12/26/2005  . Essential hypertension 12/26/2005    Current Outpatient Prescriptions:  .  allopurinol (ZYLOPRIM) 300 MG tablet, Take 300 mg by mouth daily. , Disp: , Rfl:  .  amiodarone (PACERONE) 200 MG tablet, Take 200 mg by mouth daily., Disp: , Rfl:  .  amLODipine (NORVASC) 5 MG tablet, Take 1 tablet (5 mg total) by mouth daily., Disp: 30 tablet, Rfl: 6 .  atorvastatin (LIPITOR) 40 MG tablet, Take 1 tablet (40 mg total) by mouth daily., Disp: 90 tablet, Rfl: 3 .  carvedilol (COREG) 12.5 MG tablet, Take 1.5 tablets (18.75 mg total) by mouth 2 (two) times daily with a meal., Disp: 90 tablet, Rfl: 6 .  hydrALAZINE (APRESOLINE) 100 MG tablet, TAKE 1 TABLET BY MOUTH 3 TIMES DAILY., Disp: 90 tablet, Rfl: 2 .  insulin aspart (NOVOLOG) 100 UNIT/ML injection, Inject 6 Units into the skin daily with breakfast., Disp: 10 mL, Rfl: 11 .  insulin glargine (LANTUS) 100 UNIT/ML injection, Inject 0.2 mLs (20 Units total) into the skin at bedtime., Disp: 10 mL, Rfl: 11 .  Insulin Syringe-Needle U-100 31G X 15/64" 0.3 ML MISC, Use to inject insulin 4 times a day, Disp: 200 each, Rfl: 12 .  isosorbide mononitrate (IMDUR) 30 MG 24 hr tablet, Take 3 tablets (90 mg total) by mouth daily., Disp: 270 tablet, Rfl: 3 .  magnesium oxide (MAG-OX) 400  (241.3 Mg) MG tablet, Take 1 tablet (400 mg total) by mouth daily., Disp: 30 tablet, Rfl: 6 .  metolazone (ZAROXOLYN) 2.5 MG tablet, Take 1 tablet (2.5 mg total) by mouth as needed. Take as directed, Disp: 5 tablet, Rfl: 0 .  potassium chloride SA (K-DUR,KLOR-CON) 20 MEQ tablet, Take 2 tabs in AM and 1 tab in PM, Disp: 90 tablet, Rfl: 3 .  ranolazine (RANEXA) 500 MG 12 hr tablet, Take 1 tablet (500 mg total) by mouth 2 (two) times daily., Disp: 60 tablet, Rfl: 6 .  torsemide (DEMADEX) 20 MG tablet, Take 3 tablets (60 mg total) by mouth daily., Disp: 90 tablet, Rfl: 3 .  warfarin (COUMADIN) 5 MG tablet, TAKE AS DIRECTED BY COUMADIN CLINIC, Disp: 45 tablet, Rfl: 3 .  diclofenac sodium (VOLTAREN) 1 % GEL, Apply 2 g topically 4 (four) times daily. (Patient not taking: Reported on 12/01/2016), Disp: 1 Tube, Rfl: 0 .  methocarbamol (ROBAXIN) 500 MG tablet, Take 1 tablet (500 mg total) by mouth 2 (two) times daily. (Patient not taking: Reported on 10/19/2016), Disp: 10 tablet, Rfl: 0 Allergies  Allergen Reactions  . No Known Allergies      Social History   Social History  . Marital status: Widowed    Spouse name: N/A  . Number of children: N/A  . Years of education: N/A   Occupational History  . Not on file.   Social History Main Topics  .  Smoking status: Former Smoker    Years: 10.00    Types: Cigars    Quit date: 07/28/2002  . Smokeless tobacco: Never Used  . Alcohol use No     Comment: 03/28/2016 "stopped drinking 4-5 months ago"  . Drug use: No  . Sexual activity: Not Currently   Other Topics Concern  . Not on file   Social History Narrative  . No narrative on file    Physical Exam  Constitutional: He is oriented to person, place, and time.  Cardiovascular: Normal rate and regular rhythm.   Pulmonary/Chest: Effort normal and breath sounds normal. No respiratory distress. He has no wheezes. He has no rales.  Abdominal: Soft.  Musculoskeletal: Normal range of motion. He exhibits  no edema.  Neurological: He is alert and oriented to person, place, and time.  Skin: Skin is warm and dry.  Psychiatric: He has a normal mood and affect.        Future Appointments Date Time Provider [place]  12/25/2016 11:00 AM [facility] PA/NP [facility] None  01/08/2017 10:15 AM [facility]  03/14/2017 1:15 PM Zada Finders, MD [facility]   BP 110/78 (BP Location: Right Arm, Patient Position: Sitting, Cuff Size: Normal)   Pulse 64   Resp 16   Wt 234 lb 14.4 oz (106.5 kg)   SpO2 96%   BMI 33.70 kg/m  Weight yesterday- Did not weigh Last visit weight- 235.6 lb CBG- 269 mg/dl  Mr Hymas was seen at home today and reports feeling well. He denied SOB, dizziness, headache or orthopnea. He is still not weighing daily despite multiple attempts to get him to do so. When I asked why is is not weighing daily he said he gets busy and forgets. his scale sits beside the couch where he sleeps and just above it are his medications on a shelf. I am not sure what else I can do to convince him to weigh daily. He is taking his medications but missed 2 doses of hydralazine last week. I am not sure how he missed those doses because they are in the same bin as his night time medicine. He was unable to give an answer for this either. I told him I would be gone next week and asked if I needed to send anyone out to check his meds or fill his pillbox and he said no. Medications were verified and his pillbox was refilled. Medications ordered were amiodarone, magnesium and metolazone and will be ready for pickup tomorrow.  Time spent with patient: 29 minutes  Jacquiline Doe, EMT 12/14/16  ACTION: Home visit completed Next visit planned for 1 week

## 2016-12-14 NOTE — Telephone Encounter (Signed)
LMOVM reminding pt to send remote transmission.   

## 2016-12-15 MED FILL — metOLazone 2.5 MG TABS: 2.5 | 5 days supply | Qty: 5 | Fill #0

## 2016-12-20 NOTE — Progress Notes (Signed)
Remote ICD transmission.   

## 2016-12-21 ENCOUNTER — Encounter: Payer: Self-pay | Admitting: Cardiology

## 2016-12-21 LAB — CUP PACEART REMOTE DEVICE CHECK
Battery Remaining Longevity: 77 mo
Battery Remaining Percentage: 73 %
Battery Voltage: 2.96 V
Brady Statistic RV Percent Paced: 1 %
Date Time Interrogation Session: 20181005145622
HIGH POWER IMPEDANCE MEASURED VALUE: 51 Ohm
HighPow Impedance: 51 Ohm
Implantable Lead Model: 7121
Lead Channel Sensing Intrinsic Amplitude: 10.5 mV
Lead Channel Setting Pacing Amplitude: 2.5 V
Lead Channel Setting Pacing Pulse Width: 0.5 ms
Lead Channel Setting Sensing Sensitivity: 0.5 mV
MDC IDC LEAD IMPLANT DT: 20090211
MDC IDC LEAD LOCATION: 753860
MDC IDC MSMT LEADCHNL RV IMPEDANCE VALUE: 330 Ohm
MDC IDC MSMT LEADCHNL RV PACING THRESHOLD AMPLITUDE: 1 V
MDC IDC MSMT LEADCHNL RV PACING THRESHOLD PULSEWIDTH: 0.5 ms
MDC IDC PG IMPLANT DT: 20150617
MDC IDC PG SERIAL: 7200640

## 2016-12-25 ENCOUNTER — Inpatient Hospital Stay (HOSPITAL_COMMUNITY): Admission: RE | Admit: 2016-12-25 | Payer: BLUE CROSS/BLUE SHIELD | Source: Ambulatory Visit

## 2016-12-26 ENCOUNTER — Other Ambulatory Visit (HOSPITAL_COMMUNITY): Payer: Self-pay | Admitting: Internal Medicine

## 2016-12-26 ENCOUNTER — Ambulatory Visit (HOSPITAL_COMMUNITY)
Admission: RE | Admit: 2016-12-26 | Discharge: 2016-12-26 | Disposition: A | Discharge status: Home / Self Care | Payer: BLUE CROSS/BLUE SHIELD | Source: Ambulatory Visit | Attending: Internal Medicine | Admitting: Internal Medicine

## 2016-12-26 ENCOUNTER — Encounter (HOSPITAL_COMMUNITY): Payer: Self-pay

## 2016-12-26 VITALS — BP 140/98 | HR 78 | Wt 244.2 lb

## 2016-12-26 DIAGNOSIS — I480 Paroxysmal atrial fibrillation: Secondary | ICD-10-CM | POA: Insufficient documentation

## 2016-12-26 DIAGNOSIS — I130 Hypertensive heart and chronic kidney disease with heart failure and stage 1 through stage 4 chronic kidney disease, or unspecified chronic kidney disease: Secondary | ICD-10-CM | POA: Insufficient documentation

## 2016-12-26 DIAGNOSIS — Z8249 Family history of ischemic heart disease and other diseases of the circulatory system: Secondary | ICD-10-CM | POA: Diagnosis not present

## 2016-12-26 DIAGNOSIS — I428 Other cardiomyopathies: Secondary | ICD-10-CM

## 2016-12-26 DIAGNOSIS — I5022 Chronic systolic (congestive) heart failure: Secondary | ICD-10-CM | POA: Insufficient documentation

## 2016-12-26 DIAGNOSIS — E1165 Type 2 diabetes mellitus with hyperglycemia: Secondary | ICD-10-CM | POA: Diagnosis not present

## 2016-12-26 DIAGNOSIS — Z7901 Long term (current) use of anticoagulants: Secondary | ICD-10-CM | POA: Insufficient documentation

## 2016-12-26 DIAGNOSIS — I429 Cardiomyopathy, unspecified: Secondary | ICD-10-CM | POA: Diagnosis not present

## 2016-12-26 DIAGNOSIS — I10 Essential (primary) hypertension: Secondary | ICD-10-CM | POA: Diagnosis not present

## 2016-12-26 DIAGNOSIS — I2510 Atherosclerotic heart disease of native coronary artery without angina pectoris: Secondary | ICD-10-CM | POA: Insufficient documentation

## 2016-12-26 DIAGNOSIS — Z79899 Other long term (current) drug therapy: Secondary | ICD-10-CM | POA: Diagnosis not present

## 2016-12-26 DIAGNOSIS — I4819 Other persistent atrial fibrillation: Secondary | ICD-10-CM

## 2016-12-26 DIAGNOSIS — Z833 Family history of diabetes mellitus: Secondary | ICD-10-CM | POA: Insufficient documentation

## 2016-12-26 DIAGNOSIS — Z87891 Personal history of nicotine dependence: Secondary | ICD-10-CM | POA: Insufficient documentation

## 2016-12-26 DIAGNOSIS — I4892 Unspecified atrial flutter: Secondary | ICD-10-CM | POA: Insufficient documentation

## 2016-12-26 DIAGNOSIS — Z9889 Other specified postprocedural states: Secondary | ICD-10-CM | POA: Insufficient documentation

## 2016-12-26 DIAGNOSIS — I481 Persistent atrial fibrillation: Secondary | ICD-10-CM | POA: Diagnosis not present

## 2016-12-26 DIAGNOSIS — N184 Chronic kidney disease, stage 4 (severe): Secondary | ICD-10-CM | POA: Insufficient documentation

## 2016-12-26 DIAGNOSIS — I472 Ventricular tachycardia, unspecified: Secondary | ICD-10-CM

## 2016-12-26 DIAGNOSIS — Z794 Long term (current) use of insulin: Secondary | ICD-10-CM | POA: Diagnosis not present

## 2016-12-26 DIAGNOSIS — E1122 Type 2 diabetes mellitus with diabetic chronic kidney disease: Secondary | ICD-10-CM | POA: Insufficient documentation

## 2016-12-26 LAB — BASIC METABOLIC PANEL
ANION GAP: 7 (ref 5–15)
BUN: 24 mg/dL — ABNORMAL HIGH (ref 6–20)
CALCIUM: 8.9 mg/dL (ref 8.9–10.3)
CO2: 26 mmol/L (ref 22–32)
Chloride: 107 mmol/L (ref 101–111)
Creatinine, Ser: 2.58 mg/dL — ABNORMAL HIGH (ref 0.61–1.24)
GFR, EST AFRICAN AMERICAN: 33 mL/min — AB (ref >60)
GFR, EST NON AFRICAN AMERICAN: 28 mL/min — AB (ref >60)
Glucose, Bld: 142 mg/dL — ABNORMAL HIGH (ref 65–99)
POTASSIUM: 3.4 mmol/L — AB (ref 3.5–5.1)
SODIUM: 140 mmol/L (ref 135–145)

## 2016-12-26 MED FILL — TORSEMIDE 20 MG TABLET: 20 | 30 days supply | Qty: 105 | Fill #3

## 2016-12-26 MED FILL — hydrALAZINE HCL 100 MG TABS: 100 | 30 days supply | Qty: 90 | Fill #0

## 2016-12-26 NOTE — Patient Instructions (Signed)
Labs drawn today (if we do not call you, then your lab work was stable)    Your physician recommends that you schedule a follow-up appointment in: 3 months   

## 2016-12-26 NOTE — Progress Notes (Signed)
PCP: Dr. Posey Pronto HF Cardiology: Dr. Aundra Dubin EP: Dr. Lovena Le Nephrology: Dr Lorrene Reid   46 yo with history of poorly controlled DM, atrial flutter and fibrillation, nonischemic cardiomyopathy, and CKD presents for cardiology followup.  Cardiomyopathy has been known for a number of years. Cath in 6/06 showed no obstructive coronary disease.  He had ICD discharge for VT in 10/17 and again in 1/18, both times likely in the setting of CHF exacerbation.    He was admitted in 1/18 with exertional dyspnea/CHF exacerbation.  ICD had also discharged for VT.  He was noted to be in new atrial flutter.  He had a flutter ablation.  However, post-procedure, he developed AKI and cardiogenic shock.  Dobutamine and norepinephrine were required but eventually titrated off.  He developed atrial fibrillation subsequent to the atrial flutter ablation.  He had DCCV to NSR 04/11/16.  He is on amiodarone and ranolazine.   Echo today was reviewed, EF 25-30%, moderate dilation, moderate LVH, mild MR, moderately decreased RV systolic function, PASP 51 mmHg.   Returns today for HF follow up. Feeling well, continues to work a physical job at [employer] without SOB. He is followed by paramedicine and has continued intermittent compliance with medications and daily weights. Denies chest pain, palpitations, PND.    Labs (2/18): hgb 14.6, K 4.9, creatinine 3.43 Labs 04/26/2016: K 4.1 Creatinine 2.79, TSH normal, LFTs normal, hgb 14.4 Labs (4/18): K 3.7, creatinine 2.61 Labs (6/18): LDL 167, HDL 49, hgb A1c 6    PMH: 1. Chronic systolic CHF: Nonischemic cardiomyopathy.  St Jude ICD.  - LHC (6/06): Nonobstructive CAD.  - Echo (1/18): EF 20%, moderate RV dilation with moderate-severely decreased RV systolic function, PASP 48 mmHg.  - Cardiolite (1/18): EF 7%, large fixed inferolateral defect no ischemia.  2. Atrial flutter: s/p ablation 1/18.  3. Atrial fibrillation: Paroxysmal.  S/p DCCV 1/18.  4. CKD: Stage IV. 5. Type II  diabetes: Poorly controlled over time.  6. VT: 10/17 and 1/18 with ICD discharge.    Social History   Social History  . Marital status: Widowed    Spouse name: N/A  . Number of children: N/A  . Years of education: N/A   Social History Main Topics  . Smoking status: Former Smoker    Years: 10.00    Types: Cigars    Quit date: 07/28/2002  . Smokeless tobacco: Never Used  . Alcohol use No     Comment: 03/28/2016 "stopped drinking 4-5 months ago"  . Drug use: No  . Sexual activity: Not Currently   Other Topics Concern  . None   Social History Narrative  . None   Family History  Problem Relation Age of Onset  . Hypertension Mother   . Heart disease Mother   . Diabetes Mother    ROS: All systems reviewed and negative except as per HPI.   Current Outpatient Prescriptions  Medication Sig Dispense Refill  . allopurinol (ZYLOPRIM) 300 MG tablet Take 300 mg by mouth daily.     Marland Kitchen amiodarone (PACERONE) 200 MG tablet Take 200 mg by mouth daily.    Marland Kitchen amLODipine (NORVASC) 5 MG tablet Take 1 tablet (5 mg total) by mouth daily. 30 tablet 6  . atorvastatin (LIPITOR) 40 MG tablet Take 1 tablet (40 mg total) by mouth daily. 90 tablet 3  . carvedilol (COREG) 12.5 MG tablet Take 1.5 tablets (18.75 mg total) by mouth 2 (two) times daily with a meal. 90 tablet 6  . hydrALAZINE (APRESOLINE)  100 MG tablet TAKE 1 TABLET BY MOUTH 3 TIMES DAILY. 90 tablet 2  . insulin aspart (NOVOLOG) 100 UNIT/ML injection Inject 6 Units into the skin daily with breakfast. 10 mL 11  . insulin glargine (LANTUS) 100 UNIT/ML injection Inject 0.2 mLs (20 Units total) into the skin at bedtime. 10 mL 11  . Insulin Syringe-Needle U-100 31G X 15/64" 0.3 ML MISC Use to inject insulin 4 times a day 200 each 12  . isosorbide mononitrate (IMDUR) 30 MG 24 hr tablet Take 3 tablets (90 mg total) by mouth daily. 270 tablet 3  . magnesium oxide (MAG-OX) 400 (241.3 Mg) MG tablet Take 1 tablet (400 mg total) by mouth daily. 30 tablet 6   . metolazone (ZAROXOLYN) 2.5 MG tablet TAKE 1 TABLET BY MOUTH AS NEEDED AS DIRECTED 5 tablet 0  . potassium chloride SA (K-DUR,KLOR-CON) 20 MEQ tablet Take 2 tabs in AM and 1 tab in PM 90 tablet 3  . ranolazine (RANEXA) 500 MG 12 hr tablet Take 1 tablet (500 mg total) by mouth 2 (two) times daily. 60 tablet 6  . torsemide (DEMADEX) 20 MG tablet Take 3 tablets (60 mg total) by mouth daily. 90 tablet 3  . warfarin (COUMADIN) 5 MG tablet TAKE AS DIRECTED BY COUMADIN CLINIC 45 tablet 3  . diclofenac sodium (VOLTAREN) 1 % GEL Apply 2 g topically 4 (four) times daily. (Patient not taking: Reported on 12/01/2016) 1 Tube 0  . methocarbamol (ROBAXIN) 500 MG tablet Take 1 tablet (500 mg total) by mouth 2 (two) times daily. (Patient not taking: Reported on 10/19/2016) 10 tablet 0   No current facility-administered medications for this encounter.    BP (!) 140/98 (BP Location: Left Arm, Patient Position: Sitting, Cuff Size: Large)   Pulse 78   Wt 244 lb 3.2 oz (110.8 kg)   SpO2 93%   BMI 35.04 kg/m   General: Well appearing. No resp difficulty. HEENT: Normal, poor dentition.  Neck: Supple. JVP 9-10 cm. Carotids 2+ bilat; no bruits. No thyromegaly or nodule noted. Cor: PMI nondisplaced. RRR, No M/G/R noted Lungs: CTAB, normal effort. Abdomen: Soft, non-tender, + distended, no HSM. No bruits or masses. +BS  Extremities: No cyanosis, clubbing, rash, R and LLE no edema.  Neuro: Alert & orientedx3, cranial nerves grossly intact. moves all 4 extremities w/o difficulty. Affect pleasant    Assessment/Plan: 1. Chronic systolic CHF: Nonischemic cardiomyopathy.  St Jude ICD.  Echo 1/18 with EF 20% and moderate to severely decreased RV systolic function.  Echo 6/18 with EF 25-30%, moderate dilation, moderate LVH, mild MR, moderately decreased RV systolic function, PASP 51 mmHg.  - NYHA II - Volume elevated on exam but has not taken his morning torsemide yet.  - Continue Coreg 18.75 mg BID - Continue  hydralazine 100 mg TID and Imdur 90 mg daily.  - Continue torsemide 60 mg daily.  - IVCD 136 msec, no benefit with CRT-D.    2. Atrial fibrillation: - NSR today.  - Continue warfarin for anticoagulation  3. HTN:  - Elevated today, but has not taken his medications yet.   4. Diabetes: Last A1c was 6.0  - Follows with PCP.    5. VT: Now on amiodarone.  - no further.  - Reminded him to get yearly eye exams.   6. CKD: Stage IV.   - Follows with Dr. Lorrene Reid.    Follow up in 3 months.   Arbutus Leas 12/26/2016

## 2017-01-02 ENCOUNTER — Other Ambulatory Visit: Payer: Self-pay

## 2017-01-03 MED FILL — ALLOPURINOL 300 MG TABLET: 300 | 30 days supply | Qty: 30 | Fill #2

## 2017-01-03 MED FILL — WARFARIN SODIUM 5 MG TABLET: 5 | 30 days supply | Qty: 45 | Fill #1

## 2017-01-04 ENCOUNTER — Other Ambulatory Visit (HOSPITAL_COMMUNITY): Payer: Self-pay

## 2017-01-04 MED FILL — RANEXA ER 500 MG TABLET: 500 | 30 days supply | Qty: 60 | Fill #3

## 2017-01-04 MED FILL — AMLODIPINE BESYLATE 5 MG TA: 5 | 30 days supply | Qty: 30 | Fill #4

## 2017-01-04 NOTE — Progress Notes (Signed)
Paramedicine Encounter    Patient ID: Travis Bryan, male    DOB: 05-01-1970, 46 y.o.   MRN: [id number]   Patient Care Team: Zada Finders, MD as PCP - General (Internal Medicine)  Patient Active Problem List   Diagnosis Date Noted  . Healthcare maintenance 06/24/2016  . Persistent atrial fibrillation ([place])   . Chronic kidney disease, stage IV (severe) ([place])   . Typical atrial flutter ([place])   . CARDIOMYOPATHY, SECONDARY 07/03/2008  . Chronic systolic heart failure ([place]) 07/03/2008  . Automatic implantable cardioverter-defibrillator in situ 07/03/2008  . Type 2 diabetes mellitus ([place]) 12/26/2005  . Hyperlipidemia associated with type 2 diabetes mellitus ([place]) 12/26/2005  . Essential hypertension 12/26/2005    Current Outpatient Prescriptions:  .  allopurinol (ZYLOPRIM) 300 MG tablet, Take 300 mg by mouth daily. , Disp: , Rfl:  .  amiodarone (PACERONE) 200 MG tablet, Take 200 mg by mouth daily., Disp: , Rfl:  .  amLODipine (NORVASC) 5 MG tablet, Take 1 tablet (5 mg total) by mouth daily., Disp: 30 tablet, Rfl: 6 .  atorvastatin (LIPITOR) 40 MG tablet, Take 1 tablet (40 mg total) by mouth daily., Disp: 90 tablet, Rfl: 3 .  carvedilol (COREG) 12.5 MG tablet, Take 1.5 tablets (18.75 mg total) by mouth 2 (two) times daily with a meal., Disp: 90 tablet, Rfl: 6 .  hydrALAZINE (APRESOLINE) 100 MG tablet, TAKE 1 TABLET BY MOUTH 3 TIMES DAILY., Disp: 90 tablet, Rfl: 2 .  insulin aspart (NOVOLOG) 100 UNIT/ML injection, Inject 6 Units into the skin daily with breakfast., Disp: 10 mL, Rfl: 11 .  insulin glargine (LANTUS) 100 UNIT/ML injection, Inject 0.2 mLs (20 Units total) into the skin at bedtime., Disp: 10 mL, Rfl: 11 .  Insulin Syringe-Needle U-100 31G X 15/64" 0.3 ML MISC, Use to inject insulin 4 times a day, Disp: 200 each, Rfl: 12 .  isosorbide mononitrate (IMDUR) 30 MG 24 hr tablet, Take 3 tablets (90 mg total) by mouth daily., Disp: 270 tablet, Rfl: 3 .  magnesium oxide (MAG-OX) 400  (241.3 Mg) MG tablet, Take 1 tablet (400 mg total) by mouth daily., Disp: 30 tablet, Rfl: 6 .  metolazone (ZAROXOLYN) 2.5 MG tablet, TAKE 1 TABLET BY MOUTH AS NEEDED AS DIRECTED, Disp: 5 tablet, Rfl: 0 .  potassium chloride SA (K-DUR,KLOR-CON) 20 MEQ tablet, Take 2 tabs in AM and 1 tab in PM, Disp: 90 tablet, Rfl: 3 .  ranolazine (RANEXA) 500 MG 12 hr tablet, Take 1 tablet (500 mg total) by mouth 2 (two) times daily., Disp: 60 tablet, Rfl: 6 .  torsemide (DEMADEX) 20 MG tablet, Take 3 tablets (60 mg total) by mouth daily., Disp: 90 tablet, Rfl: 3 .  warfarin (COUMADIN) 5 MG tablet, TAKE AS DIRECTED BY COUMADIN CLINIC, Disp: 45 tablet, Rfl: 3 .  diclofenac sodium (VOLTAREN) 1 % GEL, Apply 2 g topically 4 (four) times daily. (Patient not taking: Reported on 12/01/2016), Disp: 1 Tube, Rfl: 0 .  methocarbamol (ROBAXIN) 500 MG tablet, Take 1 tablet (500 mg total) by mouth 2 (two) times daily. (Patient not taking: Reported on 10/19/2016), Disp: 10 tablet, Rfl: 0 Allergies  Allergen Reactions  . No Known Allergies      Social History   Social History  . Marital status: Widowed    Spouse name: N/A  . Number of children: N/A  . Years of education: N/A   Occupational History  . Not on file.   Social History Main Topics  . Smoking status: Former Smoker  Years: 10.00    Types: Cigars    Quit date: 07/28/2002  . Smokeless tobacco: Never Used  . Alcohol use No     Comment: 03/28/2016 "stopped drinking 4-5 months ago"  . Drug use: No  . Sexual activity: Not Currently   Other Topics Concern  . Not on file   Social History Narrative  . No narrative on file    Physical Exam  Constitutional: He is oriented to person, place, and time.  Cardiovascular: Normal rate and regular rhythm.   Pulmonary/Chest: Effort normal and breath sounds normal. No respiratory distress. He has no wheezes. He has no rales.  Abdominal: Soft.  Musculoskeletal: Normal range of motion. He exhibits no edema.   Neurological: He is alert and oriented to person, place, and time.  Skin: Skin is warm and dry.  Psychiatric: He has a normal mood and affect.        Future Appointments Date Time Provider [place]  01/08/2017 10:15 AM [facility]  03/14/2017 1:15 PM Zada Finders, MD [facility]  03/21/2017 12:55 PM [facility] DEVICE REMOTES [facility]   BP 118/84 (BP Location: Right Arm, Patient Position: Sitting, Cuff Size: Large)   Pulse 74   Resp 16   Wt 241 lb 3.2 oz (109.4 kg)   SpO2 95%   BMI 34.61 kg/m  Weight yesterday- 245 lb Last visit weight- 244 lb  Travis Bryan was seen at home today and reported feeling well. He stated he has been taking his medications as prescribed. He denied SOB, dizziness, headache or orthopnea. His medications were verified and his pillbox was refilled. He said the last time he was at the clinic [place] told him he appeared to have excess fluid but does not exhibit any edema today.   Time spent with patient: 34 minutes  Jacquiline Doe, EMT 01/04/17  ACTION: Home visit completed Next visit planned for 2 weeks

## 2017-01-08 ENCOUNTER — Ambulatory Visit (INDEPENDENT_AMBULATORY_CARE_PROVIDER_SITE_OTHER): Payer: BLUE CROSS/BLUE SHIELD | Admitting: *Deleted

## 2017-01-08 DIAGNOSIS — Z95810 Presence of automatic (implantable) cardiac defibrillator: Secondary | ICD-10-CM

## 2017-01-08 DIAGNOSIS — Z5181 Encounter for therapeutic drug level monitoring: Secondary | ICD-10-CM

## 2017-01-08 DIAGNOSIS — I481 Persistent atrial fibrillation: Secondary | ICD-10-CM

## 2017-01-08 DIAGNOSIS — I483 Typical atrial flutter: Secondary | ICD-10-CM | POA: Diagnosis not present

## 2017-01-08 DIAGNOSIS — I4819 Other persistent atrial fibrillation: Secondary | ICD-10-CM

## 2017-01-08 LAB — POCT INR: INR: 3

## 2017-01-15 ENCOUNTER — Other Ambulatory Visit (HOSPITAL_COMMUNITY): Payer: Self-pay | Admitting: Cardiology

## 2017-01-15 MED FILL — CARVEDILOL 12.5 MG TABLET: 12.5 | 30 days supply | Qty: 90 | Fill #4

## 2017-01-16 ENCOUNTER — Other Ambulatory Visit (HOSPITAL_COMMUNITY): Payer: Self-pay | Admitting: *Deleted

## 2017-01-16 MED ORDER — AMLODIPINE BESYLATE 5 MG PO TABS: 5.0000 mg | ORAL_TABLET | Freq: Every day | ORAL | 6 refills | Status: DC

## 2017-01-16 MED ORDER — POTASSIUM CHLORIDE CRYS ER 20 MEQ PO TBCR: EXTENDED_RELEASE_TABLET | ORAL | 3 refills | Status: DC

## 2017-01-16 MED FILL — POTASSIUM CL ER 20 MEQ TABL: 20 | 30 days supply | Qty: 90 | Fill #0

## 2017-01-17 ENCOUNTER — Other Ambulatory Visit (HOSPITAL_COMMUNITY): Payer: Self-pay | Admitting: Pharmacist

## 2017-01-17 MED ORDER — AMIODARONE HCL 200 MG PO TABS: 200.0000 mg | ORAL_TABLET | Freq: Every day | ORAL | 5 refills | Status: DC

## 2017-01-17 MED FILL — AMIODARONE HCL 200 MG TAB: 200 | 30 days supply | Qty: 30 | Fill #0

## 2017-01-19 ENCOUNTER — Other Ambulatory Visit (HOSPITAL_COMMUNITY): Payer: Self-pay

## 2017-01-19 MED FILL — TORSEMIDE 20 MG TABLET: 20 | 17 days supply | Qty: 60 | Fill #4

## 2017-01-19 NOTE — Progress Notes (Addendum)
Paramedicine Encounter    Patient ID: Travis Bryan, male    DOB: 01-09-1971, 46 y.o.   MRN: [id number]   Patient Care Team: Zada Finders, MD as PCP - General (Internal Medicine)  Patient Active Problem List   Diagnosis Date Noted  . Healthcare maintenance 06/24/2016  . Persistent atrial fibrillation ([place])   . Chronic kidney disease, stage IV (severe) ([place])   . Typical atrial flutter ([place])   . CARDIOMYOPATHY, SECONDARY 07/03/2008  . Chronic systolic heart failure ([place]) 07/03/2008  . Automatic implantable cardioverter-defibrillator in situ 07/03/2008  . Type 2 diabetes mellitus ([place]) 12/26/2005  . Hyperlipidemia associated with type 2 diabetes mellitus ([place]) 12/26/2005  . Essential hypertension 12/26/2005    Current Outpatient Medications:  .  allopurinol (ZYLOPRIM) 300 MG tablet, Take 300 mg by mouth daily. , Disp: , Rfl:  .  amiodarone (PACERONE) 200 MG tablet, Take 1 tablet (200 mg total) daily by mouth., Disp: 30 tablet, Rfl: 5 .  amLODipine (NORVASC) 5 MG tablet, Take 1 tablet (5 mg total) daily by mouth., Disp: 30 tablet, Rfl: 6 .  atorvastatin (LIPITOR) 40 MG tablet, Take 1 tablet (40 mg total) by mouth daily., Disp: 90 tablet, Rfl: 3 .  carvedilol (COREG) 12.5 MG tablet, Take 1.5 tablets (18.75 mg total) by mouth 2 (two) times daily with a meal., Disp: 90 tablet, Rfl: 6 .  hydrALAZINE (APRESOLINE) 100 MG tablet, TAKE 1 TABLET BY MOUTH 3 TIMES DAILY., Disp: 90 tablet, Rfl: 2 .  insulin aspart (NOVOLOG) 100 UNIT/ML injection, Inject 6 Units into the skin daily with breakfast., Disp: 10 mL, Rfl: 11 .  insulin glargine (LANTUS) 100 UNIT/ML injection, Inject 0.2 mLs (20 Units total) into the skin at bedtime., Disp: 10 mL, Rfl: 11 .  Insulin Syringe-Needle U-100 31G X 15/64" 0.3 ML MISC, Use to inject insulin 4 times a day, Disp: 200 each, Rfl: 12 .  isosorbide mononitrate (IMDUR) 30 MG 24 hr tablet, Take 3 tablets (90 mg total) by mouth daily., Disp: 270 tablet, Rfl: 3 .   magnesium oxide (MAG-OX) 400 (241.3 Mg) MG tablet, Take 1 tablet (400 mg total) by mouth daily., Disp: 30 tablet, Rfl: 6 .  metolazone (ZAROXOLYN) 2.5 MG tablet, TAKE 1 TABLET BY MOUTH AS NEEDED AS DIRECTED, Disp: 5 tablet, Rfl: 0 .  potassium chloride SA (K-DUR,KLOR-CON) 20 MEQ tablet, Take 2 tabs in AM and 1 tab in PM, Disp: 90 tablet, Rfl: 3 .  ranolazine (RANEXA) 500 MG 12 hr tablet, Take 1 tablet (500 mg total) by mouth 2 (two) times daily., Disp: 60 tablet, Rfl: 6 .  torsemide (DEMADEX) 20 MG tablet, Take 3 tablets (60 mg total) by mouth daily., Disp: 90 tablet, Rfl: 3 .  warfarin (COUMADIN) 5 MG tablet, TAKE AS DIRECTED BY COUMADIN CLINIC, Disp: 45 tablet, Rfl: 3 .  diclofenac sodium (VOLTAREN) 1 % GEL, Apply 2 g topically 4 (four) times daily. (Patient not taking: Reported on 12/01/2016), Disp: 1 Tube, Rfl: 0 .  methocarbamol (ROBAXIN) 500 MG tablet, Take 1 tablet (500 mg total) by mouth 2 (two) times daily. (Patient not taking: Reported on 10/19/2016), Disp: 10 tablet, Rfl: 0 Allergies  Allergen Reactions  . No Known Allergies       Social History   Socioeconomic History  . Marital status: Widowed    Spouse name: Not on file  . Number of children: Not on file  . Years of education: Not on file  . Highest education level: Not on file  Social  Needs  . Financial resource strain: Not on file  . Food insecurity - worry: Not on file  . Food insecurity - inability: Not on file  . Transportation needs - medical: Not on file  . Transportation needs - non-medical: Not on file  Occupational History  . Not on file  Tobacco Use  . Smoking status: Former Smoker    Years: 10.00    Types: Cigars    Last attempt to quit: 07/28/2002    Years since quitting: 14.4  . Smokeless tobacco: Never Used  Substance and Sexual Activity  . Alcohol use: No    Comment: 03/28/2016 "stopped drinking 4-5 months ago"  . Drug use: No  . Sexual activity: Not Currently  Other Topics Concern  . Not on file   Social History Narrative  . Not on file    Physical Exam  Constitutional: He is oriented to person, place, and time.  Cardiovascular: Normal rate and regular rhythm.  Pulmonary/Chest: Effort normal and breath sounds normal. No respiratory distress. He has no wheezes. He has no rales.  Abdominal: Soft.  Musculoskeletal: Normal range of motion. He exhibits no edema.  Neurological: He is alert and oriented to person, place, and time.  Skin: Skin is warm and dry.  Psychiatric: He has a normal mood and affect.        Future Appointments  Date Time Provider [place]  02/05/2017 10:15 AM [facility]  03/14/2017  1:15 PM Zada Finders, MD [facility]  03/21/2017 12:55 PM [facility] DEVICE REMOTES [facility]    BP (!) 148/104 (BP Location: Left Arm, Patient Position: Sitting, Cuff Size: Large)   Pulse 74   Resp 16   Wt 237 lb 11.2 oz (107.8 kg)   SpO2 96%   BMI 34.11 kg/m   Weight yesterday-236 lb Last visit weight- 241 lb  Travis Bryan was seen at home today and reported feeling well. He denied SOB, dizziness, headache and orthopnea. He stated he wanted to start losing weight so he bough some dumbbells but has not started using them. He had not taken his medications today because he just woke up when I arrived. His medications were verified and his pillbox was refilled.   Time spent with patient: 34 minutes  Jacquiline Doe, EMT 01/19/17  ACTION: Home visit completed Next visit planned for 2 weeks

## 2017-01-31 ENCOUNTER — Other Ambulatory Visit (HOSPITAL_COMMUNITY): Payer: Self-pay

## 2017-01-31 MED FILL — WARFARIN SODIUM 5 MG TABLET: 5 | 30 days supply | Qty: 45 | Fill #2

## 2017-01-31 MED FILL — RANEXA ER 500 MG TABLET: 500 | 30 days supply | Qty: 60 | Fill #4

## 2017-01-31 MED FILL — hydrALAZINE HCL 100 MG TABS: 100 | 30 days supply | Qty: 90 | Fill #1

## 2017-01-31 NOTE — Progress Notes (Signed)
Paramedicine Encounter    Patient ID: Travis Bryan, male    DOB: 04-10-1970, 46 y.o.   MRN: [id number]   Patient Care Team: Zada Finders, MD as PCP - General (Internal Medicine)  Patient Active Problem List   Diagnosis Date Noted  . Healthcare maintenance 06/24/2016  . Persistent atrial fibrillation ([place])   . Chronic kidney disease, stage IV (severe) ([place])   . Typical atrial flutter ([place])   . CARDIOMYOPATHY, SECONDARY 07/03/2008  . Chronic systolic heart failure ([place]) 07/03/2008  . Automatic implantable cardioverter-defibrillator in situ 07/03/2008  . Type 2 diabetes mellitus ([place]) 12/26/2005  . Hyperlipidemia associated with type 2 diabetes mellitus ([place]) 12/26/2005  . Essential hypertension 12/26/2005    Current Outpatient Medications:  .  allopurinol (ZYLOPRIM) 300 MG tablet, Take 300 mg by mouth daily. , Disp: , Rfl:  .  amiodarone (PACERONE) 200 MG tablet, Take 1 tablet (200 mg total) daily by mouth., Disp: 30 tablet, Rfl: 5 .  amLODipine (NORVASC) 5 MG tablet, Take 1 tablet (5 mg total) daily by mouth., Disp: 30 tablet, Rfl: 6 .  atorvastatin (LIPITOR) 40 MG tablet, Take 1 tablet (40 mg total) by mouth daily., Disp: 90 tablet, Rfl: 3 .  carvedilol (COREG) 12.5 MG tablet, Take 1.5 tablets (18.75 mg total) by mouth 2 (two) times daily with a meal., Disp: 90 tablet, Rfl: 6 .  hydrALAZINE (APRESOLINE) 100 MG tablet, TAKE 1 TABLET BY MOUTH 3 TIMES DAILY., Disp: 90 tablet, Rfl: 2 .  insulin aspart (NOVOLOG) 100 UNIT/ML injection, Inject 6 Units into the skin daily with breakfast., Disp: 10 mL, Rfl: 11 .  insulin glargine (LANTUS) 100 UNIT/ML injection, Inject 0.2 mLs (20 Units total) into the skin at bedtime., Disp: 10 mL, Rfl: 11 .  Insulin Syringe-Needle U-100 31G X 15/64" 0.3 ML MISC, Use to inject insulin 4 times a day, Disp: 200 each, Rfl: 12 .  isosorbide mononitrate (IMDUR) 30 MG 24 hr tablet, Take 3 tablets (90 mg total) by mouth daily., Disp: 270 tablet, Rfl: 3 .   magnesium oxide (MAG-OX) 400 (241.3 Mg) MG tablet, Take 1 tablet (400 mg total) by mouth daily., Disp: 30 tablet, Rfl: 6 .  metolazone (ZAROXOLYN) 2.5 MG tablet, TAKE 1 TABLET BY MOUTH AS NEEDED AS DIRECTED, Disp: 5 tablet, Rfl: 0 .  potassium chloride SA (K-DUR,KLOR-CON) 20 MEQ tablet, Take 2 tabs in AM and 1 tab in PM, Disp: 90 tablet, Rfl: 3 .  ranolazine (RANEXA) 500 MG 12 hr tablet, Take 1 tablet (500 mg total) by mouth 2 (two) times daily., Disp: 60 tablet, Rfl: 6 .  torsemide (DEMADEX) 20 MG tablet, Take 3 tablets (60 mg total) by mouth daily., Disp: 90 tablet, Rfl: 3 .  warfarin (COUMADIN) 5 MG tablet, TAKE AS DIRECTED BY COUMADIN CLINIC, Disp: 45 tablet, Rfl: 3 .  diclofenac sodium (VOLTAREN) 1 % GEL, Apply 2 g topically 4 (four) times daily. (Patient not taking: Reported on 12/01/2016), Disp: 1 Tube, Rfl: 0 .  methocarbamol (ROBAXIN) 500 MG tablet, Take 1 tablet (500 mg total) by mouth 2 (two) times daily. (Patient not taking: Reported on 10/19/2016), Disp: 10 tablet, Rfl: 0 Allergies  Allergen Reactions  . No Known Allergies       Social History   Socioeconomic History  . Marital status: Widowed    Spouse name: Not on file  . Number of children: Not on file  . Years of education: Not on file  . Highest education level: Not on file  Social  Needs  . Financial resource strain: Not on file  . Food insecurity - worry: Not on file  . Food insecurity - inability: Not on file  . Transportation needs - medical: Not on file  . Transportation needs - non-medical: Not on file  Occupational History  . Not on file  Tobacco Use  . Smoking status: Former Smoker    Years: 10.00    Types: Cigars    Last attempt to quit: 07/28/2002    Years since quitting: 14.5  . Smokeless tobacco: Never Used  Substance and Sexual Activity  . Alcohol use: No    Comment: 03/28/2016 "stopped drinking 4-5 months ago"  . Drug use: No  . Sexual activity: Not Currently  Other Topics Concern  . Not on file   Social History Narrative  . Not on file    Physical Exam  Constitutional: He is oriented to person, place, and time.  Cardiovascular: Normal rate and regular rhythm.  Pulmonary/Chest: Effort normal and breath sounds normal. No respiratory distress. He has no wheezes. He has no rales.  Abdominal: Soft. He exhibits no distension.  Musculoskeletal: Normal range of motion. He exhibits edema.  Neurological: He is alert and oriented to person, place, and time.  Skin: Skin is warm and dry.  Psychiatric: He has a normal mood and affect.        Future Appointments  Date Time Provider [place]  02/05/2017 10:15 AM [facility]  03/14/2017  1:15 PM Zada Finders, MD [facility]  03/21/2017 12:55 PM [facility] DEVICE REMOTES [facility]    BP (!) 142/100 (BP Location: Right Arm, Patient Position: Sitting, Cuff Size: Large)   Pulse 62   Resp 16   Wt 244 lb (110.7 kg)   SpO2 92%   BMI 35.01 kg/m   Weight yesterday- Did not weigh Last visit weight- 237 lb  Mr Nott was seen at home today. He stated he felt like he had gained weight over the last several days. He stated he had been drinking several (between 3 and 4) large bottles (over 500 ml) of water per day. He said he had not been weighing daily so he could not say how much he had gained. I explained that he needed to be sure to weigh daily, limit his fluid intake to no more than 2 liters per day and that fluid included anything that he eats that has moisture. He said he understood and took Metolazone and K per clinic. His medications were verified and his pillbox was refilled. He needs Hydralazine in his pill box from Saturday through Tuesday. It has been ordered and he said he would be able to pick it up.   Time spent with patient: 29 minutes  Jacquiline Doe, EMT 01/31/17  ACTION: Home visit completed Next visit planned for 1 week

## 2017-02-05 ENCOUNTER — Ambulatory Visit (INDEPENDENT_AMBULATORY_CARE_PROVIDER_SITE_OTHER): Payer: BLUE CROSS/BLUE SHIELD | Admitting: *Deleted

## 2017-02-05 DIAGNOSIS — Z95810 Presence of automatic (implantable) cardiac defibrillator: Secondary | ICD-10-CM

## 2017-02-05 DIAGNOSIS — I4819 Other persistent atrial fibrillation: Secondary | ICD-10-CM

## 2017-02-05 DIAGNOSIS — I483 Typical atrial flutter: Secondary | ICD-10-CM | POA: Diagnosis not present

## 2017-02-05 DIAGNOSIS — Z5181 Encounter for therapeutic drug level monitoring: Secondary | ICD-10-CM

## 2017-02-05 DIAGNOSIS — I481 Persistent atrial fibrillation: Secondary | ICD-10-CM

## 2017-02-05 LAB — POCT INR: INR: 2.6

## 2017-02-05 NOTE — Patient Instructions (Signed)
Continuing taking 1 tablet daily except 1.5 tablets on Mondays, Wednesdays, and Fridays.  Recheck in 6 weeks.  Coumadin Clinic [phone]. Do 2 servings of greens a week

## 2017-02-07 MED FILL — LANTUS 100 UNITS/ML VIAL: 100 | 28 days supply | Qty: 10 | Fill #3

## 2017-02-09 ENCOUNTER — Telehealth (HOSPITAL_COMMUNITY): Payer: Self-pay

## 2017-02-09 NOTE — Telephone Encounter (Signed)
I called Mr Faraone today to schedule an appointment for next week. He said Tuesday at 11:00 would work for him.

## 2017-02-12 ENCOUNTER — Other Ambulatory Visit (HOSPITAL_COMMUNITY): Payer: Self-pay | Admitting: Cardiology

## 2017-02-12 MED FILL — POTASSIUM CL ER 20 MEQ TABL: 20 | 30 days supply | Qty: 90 | Fill #1

## 2017-02-12 MED FILL — AMIODARONE HCL 200 MG TAB: 200 | 30 days supply | Qty: 30 | Fill #1

## 2017-02-12 MED FILL — CARVEDILOL 12.5 MG TABLET: 12.5 | 30 days supply | Qty: 90 | Fill #5

## 2017-02-12 MED FILL — AMLODIPINE BESYLATE 5 MG TA: 5 | 30 days supply | Qty: 30 | Fill #5

## 2017-02-12 MED FILL — ALLOPURINOL 300 MG TABLET: 300 | 30 days supply | Qty: 30 | Fill #3

## 2017-02-13 ENCOUNTER — Other Ambulatory Visit (HOSPITAL_COMMUNITY): Payer: Self-pay | Admitting: Pharmacist

## 2017-02-13 ENCOUNTER — Other Ambulatory Visit (HOSPITAL_COMMUNITY): Payer: Self-pay

## 2017-02-13 MED ORDER — TORSEMIDE 20 MG PO TABS: 60.0000 mg | ORAL_TABLET | Freq: Every day | ORAL | 3 refills | Status: DC

## 2017-02-13 MED FILL — TORSEMIDE 20 MG TABLET: 20 | 30 days supply | Qty: 90 | Fill #0

## 2017-02-13 NOTE — Progress Notes (Signed)
Paramedicine Encounter    Patient ID: Travis Bryan, male    DOB: 1971/03/05, 46 y.o.   MRN: [id number]   Patient Care Team: Zada Finders, MD as PCP - General (Internal Medicine)  Patient Active Problem List   Diagnosis Date Noted  . Healthcare maintenance 06/24/2016  . Persistent atrial fibrillation ([place])   . Chronic kidney disease, stage IV (severe) ([place])   . Typical atrial flutter ([place])   . CARDIOMYOPATHY, SECONDARY 07/03/2008  . Chronic systolic heart failure ([place]) 07/03/2008  . Automatic implantable cardioverter-defibrillator in situ 07/03/2008  . Type 2 diabetes mellitus ([place]) 12/26/2005  . Hyperlipidemia associated with type 2 diabetes mellitus ([place]) 12/26/2005  . Essential hypertension 12/26/2005    Current Outpatient Medications:  .  allopurinol (ZYLOPRIM) 300 MG tablet, Take 300 mg by mouth daily. , Disp: , Rfl:  .  amiodarone (PACERONE) 200 MG tablet, Take 1 tablet (200 mg total) daily by mouth., Disp: 30 tablet, Rfl: 5 .  amLODipine (NORVASC) 5 MG tablet, Take 1 tablet (5 mg total) daily by mouth., Disp: 30 tablet, Rfl: 6 .  atorvastatin (LIPITOR) 40 MG tablet, Take 1 tablet (40 mg total) by mouth daily., Disp: 90 tablet, Rfl: 3 .  carvedilol (COREG) 12.5 MG tablet, Take 1.5 tablets (18.75 mg total) by mouth 2 (two) times daily with a meal., Disp: 90 tablet, Rfl: 6 .  hydrALAZINE (APRESOLINE) 100 MG tablet, TAKE 1 TABLET BY MOUTH 3 TIMES DAILY., Disp: 90 tablet, Rfl: 2 .  insulin aspart (NOVOLOG) 100 UNIT/ML injection, Inject 6 Units into the skin daily with breakfast., Disp: 10 mL, Rfl: 11 .  insulin glargine (LANTUS) 100 UNIT/ML injection, Inject 0.2 mLs (20 Units total) into the skin at bedtime., Disp: 10 mL, Rfl: 11 .  Insulin Syringe-Needle U-100 31G X 15/64" 0.3 ML MISC, Use to inject insulin 4 times a day, Disp: 200 each, Rfl: 12 .  isosorbide mononitrate (IMDUR) 30 MG 24 hr tablet, Take 3 tablets (90 mg total) by mouth daily., Disp: 270 tablet, Rfl: 3 .   magnesium oxide (MAG-OX) 400 (241.3 Mg) MG tablet, Take 1 tablet (400 mg total) by mouth daily., Disp: 30 tablet, Rfl: 6 .  metolazone (ZAROXOLYN) 2.5 MG tablet, TAKE 1 TABLET BY MOUTH AS NEEDED AS DIRECTED, Disp: 5 tablet, Rfl: 0 .  potassium chloride SA (K-DUR,KLOR-CON) 20 MEQ tablet, Take 2 tabs in AM and 1 tab in PM, Disp: 90 tablet, Rfl: 3 .  ranolazine (RANEXA) 500 MG 12 hr tablet, Take 1 tablet (500 mg total) by mouth 2 (two) times daily., Disp: 60 tablet, Rfl: 6 .  torsemide (DEMADEX) 20 MG tablet, Take 3 tablets (60 mg total) by mouth daily., Disp: 90 tablet, Rfl: 3 .  warfarin (COUMADIN) 5 MG tablet, TAKE AS DIRECTED BY COUMADIN CLINIC, Disp: 45 tablet, Rfl: 3 .  diclofenac sodium (VOLTAREN) 1 % GEL, Apply 2 g topically 4 (four) times daily. (Patient not taking: Reported on 12/01/2016), Disp: 1 Tube, Rfl: 0 .  methocarbamol (ROBAXIN) 500 MG tablet, Take 1 tablet (500 mg total) by mouth 2 (two) times daily. (Patient not taking: Reported on 10/19/2016), Disp: 10 tablet, Rfl: 0 Allergies  Allergen Reactions  . No Known Allergies       Social History   Socioeconomic History  . Marital status: Widowed    Spouse name: Not on file  . Number of children: Not on file  . Years of education: Not on file  . Highest education level: Not on file  Social  Needs  . Financial resource strain: Not on file  . Food insecurity - worry: Not on file  . Food insecurity - inability: Not on file  . Transportation needs - medical: Not on file  . Transportation needs - non-medical: Not on file  Occupational History  . Not on file  Tobacco Use  . Smoking status: Former Smoker    Years: 10.00    Types: Cigars    Last attempt to quit: 07/28/2002    Years since quitting: 14.5  . Smokeless tobacco: Never Used  Substance and Sexual Activity  . Alcohol use: No    Comment: 03/28/2016 "stopped drinking 4-5 months ago"  . Drug use: No  . Sexual activity: Not Currently  Other Topics Concern  . Not on file   Social History Narrative  . Not on file    Physical Exam      Future Appointments  Date Time Provider [place]  03/14/2017  1:15 PM Zada Finders, MD [facility]  03/19/2017 10:15 AM [facility]  03/21/2017 12:55 PM [facility] DEVICE REMOTES [facility]    BP (!) 160/110 (BP Location: Right Arm, Patient Position: Sitting, Cuff Size: Normal)   Pulse 64   Resp 16   Wt 242 lb 14.4 oz (110.2 kg)   SpO2 92%   BMI 34.85 kg/m   Weight yesterday- Did no weigh Last visit weight- 244 lb CBG 136 mg/dl  Mr Pak was seen at home and reported feeling tired he has been working late this week. He denied SOB, dizziness, headaches or orthopnea. He has not been weighing daily despite me reminding him on a weekly basis. He says he has been taking all his medications as prescribed. He did not have enough medications for me to adequately fill his pillbox but he said he was going to pick them up from the pharmacy after I left. He said he would be able to take them by reading the labels.   Time spent with patient: 30 minutes  Jacquiline Doe, EMT 02/13/17  ACTION: Home visit completed Next visit planned for 1 week

## 2017-02-21 ENCOUNTER — Telehealth (HOSPITAL_COMMUNITY): Payer: Self-pay

## 2017-02-21 NOTE — Telephone Encounter (Signed)
I called Mr Travis Bryan to schedule an appointment. He did not answer so I left a voicemail requesting he call me back to schedule an appointment for this week.

## 2017-02-22 ENCOUNTER — Telehealth (HOSPITAL_COMMUNITY): Payer: Self-pay

## 2017-02-22 NOTE — Telephone Encounter (Signed)
I called Mr Travis Bryan tin order to schedule an appointment. He did not answer so I left a voicemail requesting he call me back so we can set up a meeting.

## 2017-02-23 ENCOUNTER — Telehealth (HOSPITAL_COMMUNITY): Payer: Self-pay

## 2017-02-23 NOTE — Telephone Encounter (Signed)
Travis Bryan returned my phone call about an appointment. He stated he would be able to meet Wednesday of next week.

## 2017-02-28 ENCOUNTER — Other Ambulatory Visit (HOSPITAL_COMMUNITY): Payer: Self-pay

## 2017-02-28 NOTE — Progress Notes (Signed)
Paramedicine Encounter    Patient ID: Travis Bryan, male    DOB: [birth year], 46 y.o.   MRN: [id number]   Patient Care Team: Zada Finders, MD as PCP - General (Internal Medicine)  Patient Active Problem List   Diagnosis Date Noted  . Healthcare maintenance 06/24/2016  . Persistent atrial fibrillation ([place])   . Chronic kidney disease, stage IV (severe) ([place])   . Typical atrial flutter ([place])   . CARDIOMYOPATHY, SECONDARY 07/03/2008  . Chronic systolic heart failure ([place]) 07/03/2008  . Automatic implantable cardioverter-defibrillator in situ 07/03/2008  . Type 2 diabetes mellitus ([place]) 12/26/2005  . Hyperlipidemia associated with type 2 diabetes mellitus ([place]) 12/26/2005  . Essential hypertension 12/26/2005    Current Outpatient Medications:  .  allopurinol (ZYLOPRIM) 300 MG tablet, Take 300 mg by mouth daily. , Disp: , Rfl:  .  amiodarone (PACERONE) 200 MG tablet, Take 1 tablet (200 mg total) daily by mouth., Disp: 30 tablet, Rfl: 5 .  amLODipine (NORVASC) 5 MG tablet, Take 1 tablet (5 mg total) daily by mouth., Disp: 30 tablet, Rfl: 6 .  atorvastatin (LIPITOR) 40 MG tablet, Take 1 tablet (40 mg total) by mouth daily., Disp: 90 tablet, Rfl: 3 .  carvedilol (COREG) 12.5 MG tablet, Take 1.5 tablets (18.75 mg total) by mouth 2 (two) times daily with a meal., Disp: 90 tablet, Rfl: 6 .  hydrALAZINE (APRESOLINE) 100 MG tablet, TAKE 1 TABLET BY MOUTH 3 TIMES DAILY., Disp: 90 tablet, Rfl: 2 .  insulin aspart (NOVOLOG) 100 UNIT/ML injection, Inject 6 Units into the skin daily with breakfast., Disp: 10 mL, Rfl: 11 .  insulin glargine (LANTUS) 100 UNIT/ML injection, Inject 0.2 mLs (20 Units total) into the skin at bedtime., Disp: 10 mL, Rfl: 11 .  Insulin Syringe-Needle U-100 31G X 15/64" 0.3 ML MISC, Use to inject insulin 4 times a day, Disp: 200 each, Rfl: 12 .  isosorbide mononitrate (IMDUR) 30 MG 24 hr tablet, Take 3 tablets (90 mg total) by mouth daily., Disp: 270 tablet, Rfl: 3 .   magnesium oxide (MAG-OX) 400 (241.3 Mg) MG tablet, Take 1 tablet (400 mg total) by mouth daily., Disp: 30 tablet, Rfl: 6 .  metolazone (ZAROXOLYN) 2.5 MG tablet, TAKE 1 TABLET BY MOUTH AS NEEDED AS DIRECTED, Disp: 5 tablet, Rfl: 0 .  potassium chloride SA (K-DUR,KLOR-CON) 20 MEQ tablet, Take 2 tabs in AM and 1 tab in PM, Disp: 90 tablet, Rfl: 3 .  ranolazine (RANEXA) 500 MG 12 hr tablet, Take 1 tablet (500 mg total) by mouth 2 (two) times daily., Disp: 60 tablet, Rfl: 6 .  torsemide (DEMADEX) 20 MG tablet, Take 3 tablets (60 mg total) by mouth daily. y, Disp: 90 tablet, Rfl: 3 .  torsemide (DEMADEX) 20 MG tablet, Take 3 tablets (60 mg total) by mouth daily., Disp: 90 tablet, Rfl: 3 .  warfarin (COUMADIN) 5 MG tablet, TAKE AS DIRECTED BY COUMADIN CLINIC, Disp: 45 tablet, Rfl: 3 .  diclofenac sodium (VOLTAREN) 1 % GEL, Apply 2 g topically 4 (four) times daily. (Patient not taking: Reported on 12/01/2016), Disp: 1 Tube, Rfl: 0 .  methocarbamol (ROBAXIN) 500 MG tablet, Take 1 tablet (500 mg total) by mouth 2 (two) times daily. (Patient not taking: Reported on 10/19/2016), Disp: 10 tablet, Rfl: 0 Allergies  Allergen Reactions  . No Known Allergies       Social History   Socioeconomic History  . Marital status: Widowed    Spouse name: Not on file  . Number of  children: Not on file  . Years of education: Not on file  . Highest education level: Not on file  Social Needs  . Financial resource strain: Not on file  . Food insecurity - worry: Not on file  . Food insecurity - inability: Not on file  . Transportation needs - medical: Not on file  . Transportation needs - non-medical: Not on file  Occupational History  . Not on file  Tobacco Use  . Smoking status: Former Smoker    Years: 10.00    Types: Cigars    Last attempt to quit: 07/28/2002    Years since quitting: 14.6  . Smokeless tobacco: Never Used  Substance and Sexual Activity  . Alcohol use: No    Comment: 03/28/2016 "stopped  drinking 4-5 months ago"  . Drug use: No  . Sexual activity: Not Currently  Other Topics Concern  . Not on file  Social History Narrative  . Not on file    Physical Exam  Constitutional: He is oriented to person, place, and time.  Cardiovascular: Normal rate and regular rhythm.  Pulmonary/Chest: Effort normal and breath sounds normal. No respiratory distress. He has no wheezes. He has no rales.  Abdominal: Soft.  Musculoskeletal: Normal range of motion. He exhibits edema.  Neurological: He is alert and oriented to person, place, and time.  Skin: Skin is warm and dry.  Psychiatric: He has a normal mood and affect.        Future Appointments  Date Time Provider [place]  03/14/2017  1:15 PM Zada Finders, MD [facility]  03/19/2017 10:15 AM [facility]  03/21/2017 12:55 PM [facility] DEVICE REMOTES [facility]    BP 120/88 (BP Location: Right Arm, Patient Position: Sitting, Cuff Size: Large)   Pulse 62   Resp 16   Wt 243 lb (110.2 kg)   SpO2 94%   BMI 34.87 kg/m   Weight yesterday- 245 lb Last visit weight- 242 lb  Mr Travis Bryan was seen at home today and reported feeling well. He denied SOB, headache, dizziness of orthopnea. He reports being compliant with his medications but was no longer using his pillbox. I asked if he was able to keep up with his medications this way and he said yes. I checked each bottle to make sure the directions were correct and verified that he was taking them as directed.   Time spent with patient: 25 minutes  Jacquiline Doe, EMT 02/28/17  ACTION: Home visit completed Next visit planned for 1 week

## 2017-03-09 MED FILL — ISOSORBIDE MN ER 30 MG TAB: 30 | 90 days supply | Qty: 270 | Fill #2

## 2017-03-09 MED FILL — AMLODIPINE BESYLATE 5 MG TA: 5 | 30 days supply | Qty: 30 | Fill #6

## 2017-03-12 MED FILL — CARVEDILOL 12.5 MG TABLET: 12.5 | 30 days supply | Qty: 90 | Fill #6

## 2017-03-12 MED FILL — RANEXA ER 500 MG TABLET: 500 | 30 days supply | Qty: 60 | Fill #5

## 2017-03-12 MED FILL — ATORVASTATIN 40 MG TABLET: 40 | 90 days supply | Qty: 90 | Fill #2

## 2017-03-12 MED FILL — hydrALAZINE HCL 100 MG TABS: 100 | 30 days supply | Qty: 90 | Fill #2

## 2017-03-14 ENCOUNTER — Encounter: Payer: BLUE CROSS/BLUE SHIELD | Admitting: Internal Medicine

## 2017-03-15 MED FILL — AMIODARONE HCL 200 MG TAB: 200 | 30 days supply | Qty: 30 | Fill #2

## 2017-03-15 MED FILL — TORSEMIDE 20 MG TABLET: 20 | 30 days supply | Qty: 90 | Fill #1

## 2017-03-15 MED FILL — POTASSIUM CL ER 20 MEQ TABL: 20 | 30 days supply | Qty: 90 | Fill #2

## 2017-03-16 ENCOUNTER — Telehealth (HOSPITAL_COMMUNITY): Payer: Self-pay

## 2017-03-16 ENCOUNTER — Other Ambulatory Visit (HOSPITAL_COMMUNITY): Payer: Self-pay

## 2017-03-16 NOTE — Progress Notes (Signed)
Paramedicine Encounter    Patient ID: Travis Bryan, male    DOB: 13-Oct-1970, 47 y.o.   MRN: [id number]   Patient Care Team: Zada Finders, MD as PCP - General (Internal Medicine)  Patient Active Problem List   Diagnosis Date Noted  . Healthcare maintenance 06/24/2016  . Persistent atrial fibrillation ([place])   . Chronic kidney disease, stage IV (severe) ([place])   . Typical atrial flutter ([place])   . CARDIOMYOPATHY, SECONDARY 07/03/2008  . Chronic systolic heart failure ([place]) 07/03/2008  . Automatic implantable cardioverter-defibrillator in situ 07/03/2008  . Type 2 diabetes mellitus ([place]) 12/26/2005  . Hyperlipidemia associated with type 2 diabetes mellitus ([place]) 12/26/2005  . Essential hypertension 12/26/2005    Current Outpatient Medications:  .  allopurinol (ZYLOPRIM) 300 MG tablet, Take 300 mg by mouth daily. , Disp: , Rfl:  .  amiodarone (PACERONE) 200 MG tablet, Take 1 tablet (200 mg total) daily by mouth., Disp: 30 tablet, Rfl: 5 .  amLODipine (NORVASC) 5 MG tablet, Take 1 tablet (5 mg total) daily by mouth., Disp: 30 tablet, Rfl: 6 .  atorvastatin (LIPITOR) 40 MG tablet, Take 1 tablet (40 mg total) by mouth daily., Disp: 90 tablet, Rfl: 3 .  carvedilol (COREG) 12.5 MG tablet, Take 1.5 tablets (18.75 mg total) by mouth 2 (two) times daily with a meal., Disp: 90 tablet, Rfl: 6 .  hydrALAZINE (APRESOLINE) 100 MG tablet, TAKE 1 TABLET BY MOUTH 3 TIMES DAILY., Disp: 90 tablet, Rfl: 2 .  insulin aspart (NOVOLOG) 100 UNIT/ML injection, Inject 6 Units into the skin daily with breakfast., Disp: 10 mL, Rfl: 11 .  insulin glargine (LANTUS) 100 UNIT/ML injection, Inject 0.2 mLs (20 Units total) into the skin at bedtime., Disp: 10 mL, Rfl: 11 .  Insulin Syringe-Needle U-100 31G X 15/64" 0.3 ML MISC, Use to inject insulin 4 times a day, Disp: 200 each, Rfl: 12 .  isosorbide mononitrate (IMDUR) 30 MG 24 hr tablet, Take 3 tablets (90 mg total) by mouth daily., Disp: 270 tablet, Rfl: 3 .   magnesium oxide (MAG-OX) 400 (241.3 Mg) MG tablet, Take 1 tablet (400 mg total) by mouth daily., Disp: 30 tablet, Rfl: 6 .  metolazone (ZAROXOLYN) 2.5 MG tablet, TAKE 1 TABLET BY MOUTH AS NEEDED AS DIRECTED, Disp: 5 tablet, Rfl: 0 .  potassium chloride SA (K-DUR,KLOR-CON) 20 MEQ tablet, Take 2 tabs in AM and 1 tab in PM, Disp: 90 tablet, Rfl: 3 .  ranolazine (RANEXA) 500 MG 12 hr tablet, Take 1 tablet (500 mg total) by mouth 2 (two) times daily., Disp: 60 tablet, Rfl: 6 .  torsemide (DEMADEX) 20 MG tablet, Take 3 tablets (60 mg total) by mouth daily. y, Disp: 90 tablet, Rfl: 3 .  warfarin (COUMADIN) 5 MG tablet, TAKE AS DIRECTED BY COUMADIN CLINIC, Disp: 45 tablet, Rfl: 3 .  diclofenac sodium (VOLTAREN) 1 % GEL, Apply 2 g topically 4 (four) times daily. (Patient not taking: Reported on 12/01/2016), Disp: 1 Tube, Rfl: 0 .  methocarbamol (ROBAXIN) 500 MG tablet, Take 1 tablet (500 mg total) by mouth 2 (two) times daily. (Patient not taking: Reported on 10/19/2016), Disp: 10 tablet, Rfl: 0 .  torsemide (DEMADEX) 20 MG tablet, Take 3 tablets (60 mg total) by mouth daily., Disp: 90 tablet, Rfl: 3 Allergies  Allergen Reactions  . No Known Allergies       Social History   Socioeconomic History  . Marital status: Widowed    Spouse name: Not on file  . Number of  children: Not on file  . Years of education: Not on file  . Highest education level: Not on file  Social Needs  . Financial resource strain: Not on file  . Food insecurity - worry: Not on file  . Food insecurity - inability: Not on file  . Transportation needs - medical: Not on file  . Transportation needs - non-medical: Not on file  Occupational History  . Not on file  Tobacco Use  . Smoking status: Former Smoker    Years: 10.00    Types: Cigars    Last attempt to quit: 07/28/2002    Years since quitting: 14.6  . Smokeless tobacco: Never Used  Substance and Sexual Activity  . Alcohol use: No    Comment: 03/28/2016 "stopped  drinking 4-5 months ago"  . Drug use: No  . Sexual activity: Not Currently  Other Topics Concern  . Not on file  Social History Narrative  . Not on file    Physical Exam  Constitutional: He is oriented to person, place, and time.  Cardiovascular: Normal rate and regular rhythm.  Pulmonary/Chest: Effort normal. No respiratory distress. He has no wheezes. He has rhonchi in the left upper field and the left lower field. He has no rales.  Abdominal: Soft.  Musculoskeletal: Normal range of motion. He exhibits no edema.  Neurological: He is alert and oriented to person, place, and time.  Skin: Skin is warm and dry.  Psychiatric: He has a normal mood and affect.        Future Appointments  Date Time Provider [place]  03/19/2017 10:15 AM [facility]  03/21/2017 12:55 PM [facility] DEVICE REMOTES [facility]    BP 120/90 (BP Location: Left Arm, Patient Position: Sitting, Cuff Size: Large)   Pulse 68   Resp 18   Wt 244 lb 11.2 oz (111 kg)   SpO2 (!) 87%   BMI 35.11 kg/m   Weight yesterday- Did not weigh Last visit weight- 243 lb   Travis Bryan was seen at [facility]oe today and reported feeling generally well. He did say he has a productive cough for the past few days and rhonchi was noted in his left lung. He denied SOB, headache, dizziness or orthopnea but it was noted that his oxygen saturation was 87%. 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 I suggested Mucinex to help break up the congestion in his chest and he was agreeable. His medications were verified but he does not use a pillbox anymore. He said this is because the kids in the house usually spill it, making it pointless to fill.   Time spent with patient: 29 minutes  Jacquiline Doe, EMT 03/16/17  ACTION: Home visit completed Next visit planned for 1 week

## 2017-03-16 NOTE — Telephone Encounter (Signed)
I called Mr Mostafa the schedule an appointment. He did not answer so I left a voicemail requesting he call me back.

## 2017-03-19 ENCOUNTER — Ambulatory Visit (INDEPENDENT_AMBULATORY_CARE_PROVIDER_SITE_OTHER): Payer: BLUE CROSS/BLUE SHIELD | Admitting: *Deleted

## 2017-03-19 DIAGNOSIS — I481 Persistent atrial fibrillation: Secondary | ICD-10-CM | POA: Diagnosis not present

## 2017-03-19 DIAGNOSIS — I483 Typical atrial flutter: Secondary | ICD-10-CM | POA: Diagnosis not present

## 2017-03-19 DIAGNOSIS — I4819 Other persistent atrial fibrillation: Secondary | ICD-10-CM

## 2017-03-19 DIAGNOSIS — Z5181 Encounter for therapeutic drug level monitoring: Secondary | ICD-10-CM

## 2017-03-19 DIAGNOSIS — Z95810 Presence of automatic (implantable) cardiac defibrillator: Secondary | ICD-10-CM

## 2017-03-19 LAB — POCT INR: INR: 2.6

## 2017-03-19 NOTE — Patient Instructions (Signed)
Description   Continuing taking 1 tablet daily except 1.5 tablets on Mondays, Wednesdays, and Fridays.  Recheck in 6 weeks.  Coumadin Clinic [phone]. Do 2 servings of greens a week

## 2017-03-21 ENCOUNTER — Telehealth: Payer: Self-pay | Admitting: Cardiology

## 2017-03-21 ENCOUNTER — Encounter: Payer: BLUE CROSS/BLUE SHIELD | Admitting: *Deleted

## 2017-03-21 NOTE — Telephone Encounter (Signed)
Spoke with pt and reminded pt of remote transmission that is due today. Pt verbalized understanding.   

## 2017-03-22 ENCOUNTER — Encounter: Payer: Self-pay | Admitting: Cardiology

## 2017-03-23 MED FILL — WARFARIN SODIUM 5 MG TABLET: 5 | 30 days supply | Qty: 45 | Fill #3

## 2017-03-29 ENCOUNTER — Telehealth (HOSPITAL_COMMUNITY): Payer: Self-pay

## 2017-03-29 NOTE — Telephone Encounter (Signed)
I called Travis Bryan to schedule an appointment. He stated he would be able to meet tomorrow at 11:00.

## 2017-03-30 ENCOUNTER — Other Ambulatory Visit (HOSPITAL_COMMUNITY): Payer: Self-pay

## 2017-03-30 NOTE — Progress Notes (Signed)
Paramedicine Encounter    Patient ID: Travis Bryan, male    DOB: April 23, 1970, 47 y.o.   MRN: [id number]   Patient Care Team: Zada Finders, MD as PCP - General (Internal Medicine)  Patient Active Problem List   Diagnosis Date Noted  . Healthcare maintenance 06/24/2016  . Persistent atrial fibrillation ([place])   . Chronic kidney disease, stage IV (severe) ([place])   . Typical atrial flutter ([place])   . CARDIOMYOPATHY, SECONDARY 07/03/2008  . Chronic systolic heart failure ([place]) 07/03/2008  . Automatic implantable cardioverter-defibrillator in situ 07/03/2008  . Type 2 diabetes mellitus ([place]) 12/26/2005  . Hyperlipidemia associated with type 2 diabetes mellitus ([place]) 12/26/2005  . Essential hypertension 12/26/2005    Current Outpatient Medications:  .  allopurinol (ZYLOPRIM) 300 MG tablet, Take 300 mg by mouth daily. , Disp: , Rfl:  .  amiodarone (PACERONE) 200 MG tablet, Take 1 tablet (200 mg total) daily by mouth., Disp: 30 tablet, Rfl: 5 .  amLODipine (NORVASC) 5 MG tablet, Take 1 tablet (5 mg total) daily by mouth., Disp: 30 tablet, Rfl: 6 .  atorvastatin (LIPITOR) 40 MG tablet, Take 1 tablet (40 mg total) by mouth daily., Disp: 90 tablet, Rfl: 3 .  carvedilol (COREG) 12.5 MG tablet, Take 1.5 tablets (18.75 mg total) by mouth 2 (two) times daily with a meal., Disp: 90 tablet, Rfl: 6 .  hydrALAZINE (APRESOLINE) 100 MG tablet, TAKE 1 TABLET BY MOUTH 3 TIMES DAILY., Disp: 90 tablet, Rfl: 2 .  insulin aspart (NOVOLOG) 100 UNIT/ML injection, Inject 6 Units into the skin daily with breakfast., Disp: 10 mL, Rfl: 11 .  insulin glargine (LANTUS) 100 UNIT/ML injection, Inject 0.2 mLs (20 Units total) into the skin at bedtime., Disp: 10 mL, Rfl: 11 .  Insulin Syringe-Needle U-100 31G X 15/64" 0.3 ML MISC, Use to inject insulin 4 times a day, Disp: 200 each, Rfl: 12 .  isosorbide mononitrate (IMDUR) 30 MG 24 hr tablet, Take 3 tablets (90 mg total) by mouth daily., Disp: 270 tablet, Rfl: 3 .   magnesium oxide (MAG-OX) 400 (241.3 Mg) MG tablet, Take 1 tablet (400 mg total) by mouth daily., Disp: 30 tablet, Rfl: 6 .  metolazone (ZAROXOLYN) 2.5 MG tablet, TAKE 1 TABLET BY MOUTH AS NEEDED AS DIRECTED, Disp: 5 tablet, Rfl: 0 .  potassium chloride SA (K-DUR,KLOR-CON) 20 MEQ tablet, Take 2 tabs in AM and 1 tab in PM, Disp: 90 tablet, Rfl: 3 .  ranolazine (RANEXA) 500 MG 12 hr tablet, Take 1 tablet (500 mg total) by mouth 2 (two) times daily., Disp: 60 tablet, Rfl: 6 .  torsemide (DEMADEX) 20 MG tablet, Take 3 tablets (60 mg total) by mouth daily. y, Disp: 90 tablet, Rfl: 3 .  torsemide (DEMADEX) 20 MG tablet, Take 3 tablets (60 mg total) by mouth daily., Disp: 90 tablet, Rfl: 3 .  warfarin (COUMADIN) 5 MG tablet, TAKE AS DIRECTED BY COUMADIN CLINIC, Disp: 45 tablet, Rfl: 3 .  diclofenac sodium (VOLTAREN) 1 % GEL, Apply 2 g topically 4 (four) times daily. (Patient not taking: Reported on 12/01/2016), Disp: 1 Tube, Rfl: 0 .  methocarbamol (ROBAXIN) 500 MG tablet, Take 1 tablet (500 mg total) by mouth 2 (two) times daily. (Patient not taking: Reported on 10/19/2016), Disp: 10 tablet, Rfl: 0 Allergies  Allergen Reactions  . No Known Allergies       Social History   Socioeconomic History  . Marital status: Widowed    Spouse name: Not on file  . Number of  children: Not on file  . Years of education: Not on file  . Highest education level: Not on file  Social Needs  . Financial resource strain: Not on file  . Food insecurity - worry: Not on file  . Food insecurity - inability: Not on file  . Transportation needs - medical: Not on file  . Transportation needs - non-medical: Not on file  Occupational History  . Not on file  Tobacco Use  . Smoking status: Former Smoker    Years: 10.00    Types: Cigars    Last attempt to quit: 07/28/2002    Years since quitting: 14.6  . Smokeless tobacco: Never Used  Substance and Sexual Activity  . Alcohol use: No    Comment: 03/28/2016 "stopped  drinking 4-5 months ago"  . Drug use: No  . Sexual activity: Not Currently  Other Topics Concern  . Not on file  Social History Narrative  . Not on file    Physical Exam  Constitutional: He is oriented to person, place, and time.  Cardiovascular: Normal rate and regular rhythm.  Pulmonary/Chest: Effort normal and breath sounds normal. No respiratory distress. He has no wheezes. He has no rales.  Abdominal: Soft.  Musculoskeletal: Normal range of motion. He exhibits no edema.  Neurological: He is alert and oriented to person, place, and time.  Skin: Skin is warm and dry.  Psychiatric: He has a normal mood and affect.        Future Appointments  Date Time Provider [place]  04/30/2017 10:15 AM [facility]    BP (!) 130/100 (BP Location: Left Arm, Patient Position: Sitting, Cuff Size: Large)   Pulse 64   Resp 16   Wt 244 lb (110.7 kg)   SpO2 93%   BMI 35.01 kg/m   Weight yesterday- Did not weigh Last visit weight- 244 lb  [name] was seen at home today and reported feeling well. He denied SOB, headache, dizziness or orthopnea and said he was taking his medications as ordered. He continues to have difficulty limiting his fluid intake specifically while he is working. He also struggles with limiting high sodium foods but said he is making an effort.   Time spent with patient: 38 minutes  Jacquiline Doe, EMT 03/30/17  ACTION: Home visit completed Next visit planned for 1 week

## 2017-04-10 ENCOUNTER — Telehealth (HOSPITAL_COMMUNITY): Payer: Self-pay | Admitting: Surgery

## 2017-04-10 NOTE — Telephone Encounter (Signed)
Patient has been discharged from [facility] [place] secondary to successful completion.  He will remain to be seen in the [facility] Clinic and has been instructed to call for any needs pertaining to his heart failure.

## 2017-04-12 ENCOUNTER — Other Ambulatory Visit (HOSPITAL_COMMUNITY): Payer: Self-pay | Admitting: Cardiology

## 2017-04-12 MED FILL — POTASSIUM CL ER 20 MEQ TABL: 20 | 30 days supply | Qty: 90 | Fill #3

## 2017-04-12 MED FILL — CARVEDILOL 12.5 MG TABLET: 12.5 | 30 days supply | Qty: 90 | Fill #0

## 2017-04-12 MED FILL — AMLODIPINE BESYLATE 5 MG TA: 5 | 30 days supply | Qty: 30 | Fill #0

## 2017-04-12 MED FILL — RANEXA ER 500 MG TABLET: 500 | 30 days supply | Qty: 60 | Fill #6

## 2017-04-17 ENCOUNTER — Other Ambulatory Visit (HOSPITAL_COMMUNITY): Payer: Self-pay | Admitting: Internal Medicine

## 2017-04-17 MED FILL — TORSEMIDE 20 MG TABLET: 20 | 30 days supply | Qty: 90 | Fill #2

## 2017-04-17 MED FILL — AMIODARONE HCL 200 MG TAB: 200 | 30 days supply | Qty: 30 | Fill #3

## 2017-04-18 ENCOUNTER — Other Ambulatory Visit (HOSPITAL_COMMUNITY): Payer: Self-pay | Admitting: Pharmacist

## 2017-04-18 MED ORDER — HYDRALAZINE HCL 100 MG PO TABS: 100.0000 mg | ORAL_TABLET | Freq: Three times a day (TID) | ORAL | 5 refills | Status: DC

## 2017-04-18 MED FILL — hydrALAZINE HCL 100 MG TABS: 100 | 30 days supply | Qty: 90 | Fill #0

## 2017-04-25 ENCOUNTER — Other Ambulatory Visit (HOSPITAL_COMMUNITY): Payer: Self-pay

## 2017-04-25 NOTE — Progress Notes (Signed)
Paramedicine Encounter    Patient ID: Travis Bryan, male    DOB: 1971-02-28, 47 y.o.   MRN: [id number]   Patient Care Team: Zada Finders, MD as PCP - General (Internal Medicine)  Patient Active Problem List   Diagnosis Date Noted  . Healthcare maintenance 06/24/2016  . Persistent atrial fibrillation ([place])   . Chronic kidney disease, stage IV (severe) ([place])   . Typical atrial flutter ([place])   . CARDIOMYOPATHY, SECONDARY 07/03/2008  . Chronic systolic heart failure ([place]) 07/03/2008  . Automatic implantable cardioverter-defibrillator in situ 07/03/2008  . Type 2 diabetes mellitus ([place]) 12/26/2005  . Hyperlipidemia associated with type 2 diabetes mellitus ([place]) 12/26/2005  . Essential hypertension 12/26/2005    Current Outpatient Medications:  .  allopurinol (ZYLOPRIM) 300 MG tablet, Take 300 mg by mouth daily. , Disp: , Rfl:  .  amiodarone (PACERONE) 200 MG tablet, Take 1 tablet (200 mg total) daily by mouth., Disp: 30 tablet, Rfl: 5 .  amLODipine (NORVASC) 5 MG tablet, Take 1 tablet (5 mg total) daily by mouth., Disp: 30 tablet, Rfl: 6 .  atorvastatin (LIPITOR) 40 MG tablet, Take 1 tablet (40 mg total) by mouth daily., Disp: 90 tablet, Rfl: 3 .  carvedilol (COREG) 12.5 MG tablet, TAKE 1 & 1/2 TABLET BY MOUTH TWICE DAILY WITH A MEAL, Disp: 90 tablet, Rfl: 6 .  hydrALAZINE (APRESOLINE) 100 MG tablet, Take 1 tablet (100 mg total) by mouth 3 (three) times daily., Disp: 90 tablet, Rfl: 5 .  insulin aspart (NOVOLOG) 100 UNIT/ML injection, Inject 6 Units into the skin daily with breakfast., Disp: 10 mL, Rfl: 11 .  insulin glargine (LANTUS) 100 UNIT/ML injection, Inject 0.2 mLs (20 Units total) into the skin at bedtime., Disp: 10 mL, Rfl: 11 .  Insulin Syringe-Needle U-100 31G X 15/64" 0.3 ML MISC, Use to inject insulin 4 times a day, Disp: 200 each, Rfl: 12 .  isosorbide mononitrate (IMDUR) 30 MG 24 hr tablet, Take 3 tablets (90 mg total) by mouth daily., Disp: 270 tablet, Rfl: 3 .   magnesium oxide (MAG-OX) 400 (241.3 Mg) MG tablet, Take 1 tablet (400 mg total) by mouth daily., Disp: 30 tablet, Rfl: 6 .  metolazone (ZAROXOLYN) 2.5 MG tablet, TAKE 1 TABLET BY MOUTH AS NEEDED AS DIRECTED, Disp: 5 tablet, Rfl: 0 .  potassium chloride SA (K-DUR,KLOR-CON) 20 MEQ tablet, Take 2 tabs in AM and 1 tab in PM, Disp: 90 tablet, Rfl: 3 .  ranolazine (RANEXA) 500 MG 12 hr tablet, Take 1 tablet (500 mg total) by mouth 2 (two) times daily., Disp: 60 tablet, Rfl: 6 .  torsemide (DEMADEX) 20 MG tablet, Take 3 tablets (60 mg total) by mouth daily. y, Disp: 90 tablet, Rfl: 3 .  warfarin (COUMADIN) 5 MG tablet, TAKE AS DIRECTED BY COUMADIN CLINIC, Disp: 45 tablet, Rfl: 3 .  diclofenac sodium (VOLTAREN) 1 % GEL, Apply 2 g topically 4 (four) times daily. (Patient not taking: Reported on 12/01/2016), Disp: 1 Tube, Rfl: 0 .  methocarbamol (ROBAXIN) 500 MG tablet, Take 1 tablet (500 mg total) by mouth 2 (two) times daily. (Patient not taking: Reported on 10/19/2016), Disp: 10 tablet, Rfl: 0 .  torsemide (DEMADEX) 20 MG tablet, Take 3 tablets (60 mg total) by mouth daily., Disp: 90 tablet, Rfl: 3 Allergies  Allergen Reactions  . No Known Allergies       Social History   Socioeconomic History  . Marital status: Widowed    Spouse name: Not on file  . Number  of children: Not on file  . Years of education: Not on file  . Highest education level: Not on file  Social Needs  . Financial resource strain: Not on file  . Food insecurity - worry: Not on file  . Food insecurity - inability: Not on file  . Transportation needs - medical: Not on file  . Transportation needs - non-medical: Not on file  Occupational History  . Not on file  Tobacco Use  . Smoking status: Former Smoker    Years: 10.00    Types: Cigars    Last attempt to quit: 07/28/2002    Years since quitting: 14.7  . Smokeless tobacco: Never Used  Substance and Sexual Activity  . Alcohol use: No    Comment: 03/28/2016 "stopped  drinking 4-5 months ago"  . Drug use: No  . Sexual activity: Not Currently  Other Topics Concern  . Not on file  Social History Narrative  . Not on file    Physical Exam  Constitutional: He is oriented to person, place, and time.  Cardiovascular: Normal rate and regular rhythm.  Pulmonary/Chest: Effort normal and breath sounds normal. No respiratory distress. He has no wheezes. He has no rales.  Abdominal: Soft.  Musculoskeletal: Normal range of motion. He exhibits no edema.  Neurological: He is alert and oriented to person, place, and time.  Skin: Skin is warm and dry.  Psychiatric: He has a normal mood and affect.        Future Appointments  Date Time Provider [place]  04/30/2017 10:15 AM [facility]    BP 124/72 (BP Location: Right Arm, Patient Position: Sitting, Cuff Size: Large)   Pulse 64   Resp 16   Wt 244 lb 14.4 oz (111.1 kg)   SpO2 92%   BMI 35.14 kg/m   Weight yesterday- 244 lb Last visit weight- 244 lb  Mr Travis Bryan was seen at home today and reported feeling well. He denied SOB, headache, dizziness, or orthopnea. He reports being compliant with his medications but does not use a pillbox. I went through his medications with him and asked how he took each one and he was able to answer correctly for all of them. I believe he is now stable and able to manage his heart failure without the assistance of paramedicine. We discussed this and he was very happy about being discharged. I will pass this along to the clinic.  Time spent with patient: 32 minutes  Jacquiline Doe, EMT 04/25/17  ACTION: Home visit completed

## 2017-04-30 ENCOUNTER — Ambulatory Visit (INDEPENDENT_AMBULATORY_CARE_PROVIDER_SITE_OTHER): Payer: BLUE CROSS/BLUE SHIELD | Admitting: *Deleted

## 2017-04-30 DIAGNOSIS — I483 Typical atrial flutter: Secondary | ICD-10-CM

## 2017-04-30 DIAGNOSIS — Z5181 Encounter for therapeutic drug level monitoring: Secondary | ICD-10-CM | POA: Diagnosis not present

## 2017-04-30 DIAGNOSIS — I481 Persistent atrial fibrillation: Secondary | ICD-10-CM

## 2017-04-30 DIAGNOSIS — I4819 Other persistent atrial fibrillation: Secondary | ICD-10-CM

## 2017-04-30 DIAGNOSIS — Z95810 Presence of automatic (implantable) cardiac defibrillator: Secondary | ICD-10-CM

## 2017-04-30 LAB — POCT INR: INR: 2.3

## 2017-04-30 NOTE — Patient Instructions (Addendum)
Description   Continue taking 1 tablet daily except 1.5 tablets on Mondays, Wednesdays, and Fridays.  Recheck in 6 weeks.  [facility] Clinic [phone]. Do 2 servings of greens a week     

## 2017-05-03 ENCOUNTER — Other Ambulatory Visit: Payer: Self-pay | Admitting: Cardiology

## 2017-05-03 ENCOUNTER — Other Ambulatory Visit (HOSPITAL_COMMUNITY): Payer: Self-pay | Admitting: Student

## 2017-05-03 MED FILL — WARFARIN SODIUM 5 MG TABLET: 5 | 30 days supply | Qty: 45 | Fill #0

## 2017-05-03 NOTE — Telephone Encounter (Signed)
New message      *STAT* If patient is at the pharmacy, call can be transferred to refill team.   1. Which medications need to be refilled? (please list name of each medication and dose if known)  warfarin (COUMADIN) 5 MG tablet TAKE AS DIRECTED BY COUMADIN CLINIC     2. Which pharmacy/location (including street and city if local pharmacy) is medication to be sent to?  [facility]   3. Do they need a 30 day or 90 day supply?  [place]

## 2017-05-14 ENCOUNTER — Other Ambulatory Visit (HOSPITAL_COMMUNITY): Payer: Self-pay | Admitting: Cardiology

## 2017-05-14 MED FILL — CARVEDILOL 12.5 MG TABLET: 12.5 | 30 days supply | Qty: 90 | Fill #1

## 2017-05-14 MED FILL — AMLODIPINE BESYLATE 5 MG TA: 5 | 30 days supply | Qty: 30 | Fill #1

## 2017-05-16 ENCOUNTER — Other Ambulatory Visit (HOSPITAL_COMMUNITY): Payer: Self-pay | Admitting: Pharmacist

## 2017-05-16 MED ORDER — RANOLAZINE ER 500 MG PO TB12: 500.0000 mg | ORAL_TABLET | Freq: Two times a day (BID) | ORAL | 6 refills | Status: DC

## 2017-05-16 MED ORDER — POTASSIUM CHLORIDE CRYS ER 20 MEQ PO TBCR: EXTENDED_RELEASE_TABLET | ORAL | 3 refills | Status: DC

## 2017-05-16 MED FILL — POTASSIUM CL ER 20 MEQ TABL: 20 | 30 days supply | Qty: 90 | Fill #0

## 2017-05-16 MED FILL — RANEXA ER 500 MG TABLET: 500 | 30 days supply | Qty: 60 | Fill #0

## 2017-05-23 MED FILL — AMIODARONE HCL 200 MG TAB: 200 | 30 days supply | Qty: 30 | Fill #4

## 2017-05-23 MED FILL — TORSEMIDE 20 MG TABLET: 20 | 30 days supply | Qty: 90 | Fill #3

## 2017-05-23 MED FILL — hydrALAZINE HCL 100 MG TABS: 100 | 30 days supply | Qty: 90 | Fill #1

## 2017-06-07 MED FILL — ISOSORBIDE MN ER 30 MG TAB: 30 | 90 days supply | Qty: 270 | Fill #3

## 2017-06-07 MED FILL — WARFARIN SODIUM 5 MG TABLET: 5 | 30 days supply | Qty: 45 | Fill #1

## 2017-06-07 MED FILL — ALLOPURINOL 300 MG TABLET: 300 | 30 days supply | Qty: 30 | Fill #0

## 2017-06-11 ENCOUNTER — Ambulatory Visit (INDEPENDENT_AMBULATORY_CARE_PROVIDER_SITE_OTHER): Payer: BLUE CROSS/BLUE SHIELD | Admitting: *Deleted

## 2017-06-11 DIAGNOSIS — I481 Persistent atrial fibrillation: Secondary | ICD-10-CM

## 2017-06-11 DIAGNOSIS — I483 Typical atrial flutter: Secondary | ICD-10-CM | POA: Diagnosis not present

## 2017-06-11 DIAGNOSIS — Z95810 Presence of automatic (implantable) cardiac defibrillator: Secondary | ICD-10-CM

## 2017-06-11 DIAGNOSIS — Z5181 Encounter for therapeutic drug level monitoring: Secondary | ICD-10-CM

## 2017-06-11 DIAGNOSIS — I4819 Other persistent atrial fibrillation: Secondary | ICD-10-CM

## 2017-06-11 LAB — POCT INR: INR: 2.5

## 2017-06-11 NOTE — Patient Instructions (Signed)
Description   Continue taking 1 tablet daily except 1.5 tablets on Mondays, Wednesdays, and Fridays.  Recheck in 6 weeks.  Coumadin Clinic [phone]. Do 2 servings of greens a week     

## 2017-06-13 MED FILL — ATORVASTATIN 40 MG TABLET: 40 | 90 days supply | Qty: 90 | Fill #3

## 2017-06-13 MED FILL — AMLODIPINE BESYLATE 5 MG TA: 5 | 30 days supply | Qty: 30 | Fill #2

## 2017-06-13 MED FILL — POTASSIUM CL ER 20 MEQ TABL: 20 | 30 days supply | Qty: 90 | Fill #1

## 2017-06-13 MED FILL — RANOLAZINE ER 500 MG TB12: 500 | 30 days supply | Qty: 60 | Fill #1

## 2017-06-20 ENCOUNTER — Ambulatory Visit (INDEPENDENT_AMBULATORY_CARE_PROVIDER_SITE_OTHER): Payer: BLUE CROSS/BLUE SHIELD | Admitting: *Deleted

## 2017-06-20 DIAGNOSIS — I428 Other cardiomyopathies: Secondary | ICD-10-CM

## 2017-06-20 DIAGNOSIS — I5022 Chronic systolic (congestive) heart failure: Secondary | ICD-10-CM

## 2017-06-20 NOTE — Progress Notes (Signed)
Remote ICD transmission.   

## 2017-06-21 ENCOUNTER — Encounter: Payer: Self-pay | Admitting: Cardiology

## 2017-06-25 MED FILL — hydrALAZINE HCL 100 MG TABS: 100 | 30 days supply | Qty: 90 | Fill #2

## 2017-06-25 MED FILL — CARVEDILOL 12.5 MG TABLET: 12.5 | 30 days supply | Qty: 90 | Fill #2

## 2017-06-25 MED FILL — AMIODARONE HCL 200 MG TAB: 200 | 30 days supply | Qty: 30 | Fill #5

## 2017-06-25 MED FILL — CALCITRIOL 0.25 MCG CAPS: 0.25 | 30 days supply | Qty: 30 | Fill #0

## 2017-06-27 ENCOUNTER — Telehealth (HOSPITAL_COMMUNITY): Payer: Self-pay | Admitting: Pharmacist

## 2017-06-27 MED ORDER — TORSEMIDE 20 MG PO TABS: 60.0000 mg | ORAL_TABLET | Freq: Every day | ORAL | 5 refills | Status: DC

## 2017-06-27 NOTE — Telephone Encounter (Signed)
Received a message from Janit Bern, Outpatient Pharmacy manager, who stated that they are no longer able to order the torsemide manufacturer that was covered by Travis Bryan so the only Chief Strategy Officer that they are able to order for him would be $70/mo. It looks like he should be able to get #90 tablets at [facility] for $24 and after speaking with Travis Bryan, he would like [place] to send the Rx there.   Ruta Hinds. Velva Harman, PharmD, BCPS, CPP Clinical Pharmacist Phone: [phone] 06/27/2017 4:09 PM

## 2017-07-11 ENCOUNTER — Encounter (HOSPITAL_COMMUNITY): Payer: Self-pay

## 2017-07-11 ENCOUNTER — Ambulatory Visit (HOSPITAL_COMMUNITY)
Admission: RE | Admit: 2017-07-11 | Discharge: 2017-07-11 | Disposition: A | Discharge status: Home / Self Care | Payer: BLUE CROSS/BLUE SHIELD | Source: Ambulatory Visit | Attending: Internal Medicine | Admitting: Internal Medicine

## 2017-07-11 VITALS — BP 168/102 | HR 70 | Wt 251.0 lb

## 2017-07-11 DIAGNOSIS — I130 Hypertensive heart and chronic kidney disease with heart failure and stage 1 through stage 4 chronic kidney disease, or unspecified chronic kidney disease: Secondary | ICD-10-CM | POA: Insufficient documentation

## 2017-07-11 DIAGNOSIS — I428 Other cardiomyopathies: Secondary | ICD-10-CM | POA: Diagnosis not present

## 2017-07-11 DIAGNOSIS — Z7901 Long term (current) use of anticoagulants: Secondary | ICD-10-CM | POA: Insufficient documentation

## 2017-07-11 DIAGNOSIS — E1165 Type 2 diabetes mellitus with hyperglycemia: Secondary | ICD-10-CM | POA: Diagnosis not present

## 2017-07-11 DIAGNOSIS — I472 Ventricular tachycardia: Secondary | ICD-10-CM | POA: Diagnosis not present

## 2017-07-11 DIAGNOSIS — I10 Essential (primary) hypertension: Secondary | ICD-10-CM

## 2017-07-11 DIAGNOSIS — I2510 Atherosclerotic heart disease of native coronary artery without angina pectoris: Secondary | ICD-10-CM | POA: Insufficient documentation

## 2017-07-11 DIAGNOSIS — E1122 Type 2 diabetes mellitus with diabetic chronic kidney disease: Secondary | ICD-10-CM | POA: Insufficient documentation

## 2017-07-11 DIAGNOSIS — I4892 Unspecified atrial flutter: Secondary | ICD-10-CM | POA: Diagnosis not present

## 2017-07-11 DIAGNOSIS — Z87891 Personal history of nicotine dependence: Secondary | ICD-10-CM | POA: Diagnosis not present

## 2017-07-11 DIAGNOSIS — I5022 Chronic systolic (congestive) heart failure: Secondary | ICD-10-CM | POA: Diagnosis present

## 2017-07-11 DIAGNOSIS — Z794 Long term (current) use of insulin: Secondary | ICD-10-CM | POA: Diagnosis not present

## 2017-07-11 DIAGNOSIS — Z95810 Presence of automatic (implantable) cardiac defibrillator: Secondary | ICD-10-CM | POA: Diagnosis not present

## 2017-07-11 DIAGNOSIS — N184 Chronic kidney disease, stage 4 (severe): Secondary | ICD-10-CM | POA: Diagnosis not present

## 2017-07-11 DIAGNOSIS — I480 Paroxysmal atrial fibrillation: Secondary | ICD-10-CM | POA: Insufficient documentation

## 2017-07-11 DIAGNOSIS — I429 Cardiomyopathy, unspecified: Secondary | ICD-10-CM | POA: Insufficient documentation

## 2017-07-11 DIAGNOSIS — Z79899 Other long term (current) drug therapy: Secondary | ICD-10-CM | POA: Insufficient documentation

## 2017-07-11 LAB — BASIC METABOLIC PANEL
ANION GAP: 11 (ref 5–15)
BUN: 27 mg/dL — ABNORMAL HIGH (ref 6–20)
CO2: 27 mmol/L (ref 22–32)
Calcium: 9.2 mg/dL (ref 8.9–10.3)
Chloride: 102 mmol/L (ref 101–111)
Creatinine, Ser: 2.76 mg/dL — ABNORMAL HIGH (ref 0.61–1.24)
GFR calc non Af Amer: 26 mL/min — ABNORMAL LOW (ref >60)
GFR, EST AFRICAN AMERICAN: 30 mL/min — AB (ref >60)
GLUCOSE: 129 mg/dL — AB (ref 65–99)
POTASSIUM: 3.8 mmol/L (ref 3.5–5.1)
Sodium: 140 mmol/L (ref 135–145)

## 2017-07-11 MED ORDER — CARVEDILOL 25 MG PO TABS: 25.0000 mg | ORAL_TABLET | Freq: Two times a day (BID) | ORAL | 3 refills | Status: DC

## 2017-07-11 NOTE — Patient Instructions (Addendum)
Labs today (will call for abnormal results, otherwise no news is good news)  INCREASE Carvedilol to 25 mg (1 Tablet) Twice Daily  Echocardiogram and follow up with Dr. Aundra Dubin in 6 months, please call in October to schedule your follow up.  [phone], Option 3.

## 2017-07-11 NOTE — Progress Notes (Signed)
PCP: Dr. Posey Pronto HF Cardiology: Dr. Aundra Dubin EP: Dr. Lovena Le Nephrology: Dr Cassandria Santee is a 47 y.o. male with history of poorly controlled DM, atrial flutter and fibrillation, nonischemic cardiomyopathy, and CKD presents for cardiology followup.  Cardiomyopathy has been known for a number of years. Cath in 6/06 showed no obstructive coronary disease.  He had ICD discharge for VT in 10/17 and again in 1/18, both times likely in the setting of CHF exacerbation.    He was admitted in 1/18 with exertional dyspnea/CHF exacerbation.  ICD had also discharged for VT.  He was noted to be in new atrial flutter.  He had a flutter ablation.  However, post-procedure, he developed AKI and cardiogenic shock.  Dobutamine and norepinephrine were required but eventually titrated off.  He developed atrial fibrillation subsequent to the atrial flutter ablation.  He had DCCV to NSR 04/11/16.  He is on amiodarone and ranolazine.   Echo 09/2016 EF 25-30%, moderate dilation, moderate LVH, mild MR, moderately decreased RV systolic function, PASP 51 mmHg.   He presents today for regular follow up. Overall feeling well. Continues to work at [employer] without SOB. Has graduated paramedicine.  Sees Dr. Lorrene Reid for his kidneys, had good report last month.  He states out of week, he will miss the middle dose of hydralazine 1-2 times. Otherwise, taking all medications as directed. Denies lightheadedness or dizziness. No CP, lightheadedness, orthopnea, or PND.   Labs (2/18): hgb 14.6, K 4.9, creatinine 3.43 Labs 04/26/2016: K 4.1 Creatinine 2.79, TSH normal, LFTs normal, hgb 14.4 Labs (4/18): K 3.7, creatinine 2.61 Labs (6/18): LDL 167, HDL 49, hgb A1c 6   PMH: 1. Chronic systolic CHF: Nonischemic cardiomyopathy.  St Jude ICD.  - LHC (6/06): Nonobstructive CAD.  - Echo (1/18): EF 20%, moderate RV dilation with moderate-severely decreased RV systolic function, PASP 48 mmHg.  - Cardiolite (1/18): EF 7%, large fixed  inferolateral defect no ischemia.  2. Atrial flutter: s/p ablation 1/18.  3. Atrial fibrillation: Paroxysmal.  S/p DCCV 1/18.  4. CKD: Stage IV. 5. Type II diabetes: Poorly controlled over time.  6. VT: 10/17 and 1/18 with ICD discharge.    Review of systems complete and found to be negative unless listed in HPI.    Social History   Socioeconomic History  . Marital status: Widowed    Spouse name: Not on file  . Number of children: Not on file  . Years of education: Not on file  . Highest education level: Not on file  Occupational History  . Not on file  Social Needs  . Financial resource strain: Not on file  . Food insecurity:    Worry: Not on file    Inability: Not on file  . Transportation needs:    Medical: Not on file    Non-medical: Not on file  Tobacco Use  . Smoking status: Former Smoker    Years: 10.00    Types: Cigars    Last attempt to quit: 07/28/2002    Years since quitting: 14.9  . Smokeless tobacco: Never Used  Substance and Sexual Activity  . Alcohol use: No    Comment: 03/28/2016 "stopped drinking 4-5 months ago"  . Drug use: No  . Sexual activity: Not Currently  Lifestyle  . Physical activity:    Days per week: Not on file    Minutes per session: Not on file  . Stress: Not on file  Relationships  . Social connections:    Talks  on phone: Not on file    Gets together: Not on file    Attends religious service: Not on file    Active member of club or organization: Not on file    Attends meetings of clubs or organizations: Not on file    Relationship status: Not on file  Other Topics Concern  . Not on file  Social History Narrative  . Not on file   Family History  Problem Relation Age of Onset  . Hypertension Mother   . Heart disease Mother   . Diabetes Mother    Review of systems complete and found to be negative unless listed in HPI.    Current Outpatient Medications  Medication Sig Dispense Refill  . amiodarone (PACERONE) 200 MG tablet  Take 1 tablet (200 mg total) daily by mouth. 30 tablet 5  . amLODipine (NORVASC) 5 MG tablet Take 1 tablet (5 mg total) daily by mouth. 30 tablet 6  . atorvastatin (LIPITOR) 40 MG tablet Take 1 tablet (40 mg total) by mouth daily. 90 tablet 3  . carvedilol (COREG) 12.5 MG tablet TAKE 1 & 1/2 TABLET BY MOUTH TWICE DAILY WITH A MEAL 90 tablet 6  . hydrALAZINE (APRESOLINE) 100 MG tablet Take 1 tablet (100 mg total) by mouth 3 (three) times daily. 90 tablet 5  . isosorbide mononitrate (IMDUR) 30 MG 24 hr tablet Take 3 tablets (90 mg total) by mouth daily. 270 tablet 3  . potassium chloride SA (K-DUR,KLOR-CON) 20 MEQ tablet Take 2 tabs in AM and 1 tab in PM 90 tablet 3  . ranolazine (RANEXA) 500 MG 12 hr tablet Take 1 tablet (500 mg total) by mouth 2 (two) times daily. 60 tablet 6  . torsemide (DEMADEX) 20 MG tablet Take 3 tablets (60 mg total) by mouth daily. 90 tablet 5  . warfarin (COUMADIN) 5 MG tablet TAKE AS DIRECTED BY COUMADIN CLINIC 45 tablet 3  . allopurinol (ZYLOPRIM) 300 MG tablet Take 300 mg by mouth daily.     . diclofenac sodium (VOLTAREN) 1 % GEL Apply 2 g topically 4 (four) times daily. (Patient not taking: Reported on 12/01/2016) 1 Tube 0  . insulin aspart (NOVOLOG) 100 UNIT/ML injection Inject 6 Units into the skin daily with breakfast. 10 mL 11  . insulin glargine (LANTUS) 100 UNIT/ML injection Inject 0.2 mLs (20 Units total) into the skin at bedtime. 10 mL 11  . Insulin Syringe-Needle U-100 31G X 15/64" 0.3 ML MISC Use to inject insulin 4 times a day 200 each 12  . magnesium oxide (MAG-OX) 400 (241.3 Mg) MG tablet Take 1 tablet (400 mg total) by mouth daily. 30 tablet 6  . methocarbamol (ROBAXIN) 500 MG tablet Take 1 tablet (500 mg total) by mouth 2 (two) times daily. (Patient not taking: Reported on 10/19/2016) 10 tablet 0  . metolazone (ZAROXOLYN) 2.5 MG tablet TAKE 1 TABLET BY MOUTH AS NEEDED AS DIRECTED 5 tablet 0   No current facility-administered medications for this encounter.     Vitals:   07/11/17 1000  BP: (!) 168/102  Pulse: 70  SpO2: 94%  Weight: 251 lb (113.9 kg)   Wt Readings from Last 3 Encounters:  07/11/17 251 lb (113.9 kg)  04/25/17 244 lb 14.4 oz (111.1 kg)  03/30/17 244 lb (110.7 kg)   Physical Exam General: Well appearing. No resp difficulty. HEENT: Normal x/for poor dentition.  Neck: Supple. JVP 6-7 cm. Carotids 2+ bilat; no bruits. No thyromegaly or nodule noted. Cor: PMI nondisplaced. RRR,  No M/G/R noted Lungs: CTAB, normal effort. Abdomen: Soft, non-tender, non-distended, no HSM. No bruits or masses. +BS  Extremities: No cyanosis, clubbing, or rash. R and LLE no edema.  Neuro: Alert & orientedx3, cranial nerves grossly intact. moves all 4 extremities w/o difficulty. Affect pleasant   Assessment/Plan: 1. Chronic systolic CHF: Nonischemic cardiomyopathy.  St Jude ICD.  Echo 1/18 with EF 20% and moderate to severely decreased RV systolic function.  Echo 6/18 with EF 25-30%, moderate dilation, moderate LVH, mild MR, moderately decreased RV systolic function, PASP 51 mmHg.  - NYHA II - Volume status stable on exam and corevue - Increase coreg to 25 mg BID.  - Continue hydralazine 100 mg TID and Imdur 90 mg daily.  - Continue torsemide 60 mg daily. BMET today.  - Not on ACE/ARB/Spiro with CKD IV.  - IVCD 136 msec, no benefit with CRT-D.  - Reinforced fluid restriction to < 2 L daily, sodium restriction to less than 2000 mg daily, and the importance of daily weights.     2. Atrial fibrillation: - Regular on exam. No AT/AF on Corevue.  - Continue warfarin for anticoag  3. HTN:  - Meds as above.   4. Diabetes: Last A1c was 6.0  - Per PCP.   5. VT: Now on amiodarone.  - Quiescent on ICD interrogation.  - Reminded him to get yearly eye exams.   6. CKD: Stage IV.   - Follows with Dr. Lorrene Reid. Will await her office note.   RTC 4-6 months with Echo. Overall, he is on appropriate meds for CHF in setting of his CKD IV. Would not be  candidate for LVAD with same, and would be poor candidate for Heart/Kidney with questionable social support. Labs today. Meds as above.   Shirley Friar, PA-C  07/11/2017  Greater than 50% of the 25 minute visit was spent in counseling/coordination of care regarding disease state education, salt/fluid restriction, sliding scale diuretics, and medication compliance

## 2017-07-16 MED FILL — RANOLAZINE ER 500 MG TB12: 500 | 30 days supply | Qty: 60 | Fill #2

## 2017-07-16 MED FILL — WARFARIN SODIUM 5 MG TABLET: 5 | 30 days supply | Qty: 45 | Fill #2

## 2017-07-16 MED FILL — POTASSIUM CL ER 20 MEQ TABL: 20 | 30 days supply | Qty: 90 | Fill #2

## 2017-07-16 MED FILL — AMLODIPINE BESYLATE 5 MG TA: 5 | 30 days supply | Qty: 30 | Fill #3

## 2017-07-20 LAB — CUP PACEART REMOTE DEVICE CHECK
Implantable Lead Location: 753860
MDC IDC LEAD IMPLANT DT: 20090211
MDC IDC PG IMPLANT DT: 20150617
MDC IDC SESS DTM: 20190510122005
MDC IDC SET LEADCHNL RV PACING AMPLITUDE: 2.5 V
MDC IDC SET LEADCHNL RV PACING PULSEWIDTH: 0.5 ms
MDC IDC SET LEADCHNL RV SENSING SENSITIVITY: 0.5 mV
Pulse Gen Serial Number: 7200640

## 2017-07-25 ENCOUNTER — Other Ambulatory Visit (HOSPITAL_COMMUNITY): Payer: Self-pay | Admitting: Cardiology

## 2017-07-25 MED FILL — CARVEDILOL 12.5 MG TABLET: 12.5 | 30 days supply | Qty: 90 | Fill #3

## 2017-07-26 MED FILL — AMIODARONE HCL 200 MG TAB: 200 | 30 days supply | Qty: 30 | Fill #0

## 2017-07-30 MED FILL — TORSEMIDE 20 MG TABLET: 20 | 30 days supply | Qty: 90 | Fill #0

## 2017-07-30 MED FILL — hydrALAZINE HCL 100 MG TABS: 100 | 30 days supply | Qty: 90 | Fill #3

## 2017-07-30 MED FILL — CALCITRIOL 0.25 MCG CAPS: 0.25 | 30 days supply | Qty: 30 | Fill #1

## 2017-08-01 ENCOUNTER — Ambulatory Visit (INDEPENDENT_AMBULATORY_CARE_PROVIDER_SITE_OTHER): Payer: BLUE CROSS/BLUE SHIELD | Admitting: *Deleted

## 2017-08-01 DIAGNOSIS — Z5181 Encounter for therapeutic drug level monitoring: Secondary | ICD-10-CM | POA: Diagnosis not present

## 2017-08-01 DIAGNOSIS — I481 Persistent atrial fibrillation: Secondary | ICD-10-CM | POA: Diagnosis not present

## 2017-08-01 DIAGNOSIS — I483 Typical atrial flutter: Secondary | ICD-10-CM | POA: Diagnosis not present

## 2017-08-01 DIAGNOSIS — I4819 Other persistent atrial fibrillation: Secondary | ICD-10-CM

## 2017-08-01 DIAGNOSIS — Z95810 Presence of automatic (implantable) cardiac defibrillator: Secondary | ICD-10-CM

## 2017-08-01 LAB — POCT INR: INR: 2.7 (ref 2.0–3.0)

## 2017-08-01 NOTE — Patient Instructions (Signed)
Description   Continue taking 1 tablet daily except 1.5 tablets on Mondays, Wednesdays, and Fridays.  Recheck in 6 weeks.  Coumadin Clinic [phone]. Do 2 servings of greens a week     

## 2017-08-15 MED FILL — POTASSIUM CL ER 20 MEQ TABL: 20 | 30 days supply | Qty: 90 | Fill #3

## 2017-08-15 MED FILL — ALLOPURINOL 300 MG TABLET: 300 | 30 days supply | Qty: 30 | Fill #1

## 2017-08-15 MED FILL — RANOLAZINE ER 500 MG TB12: 500 | 30 days supply | Qty: 60 | Fill #3

## 2017-08-15 MED FILL — AMLODIPINE BESYLATE 5 MG TA: 5 | 30 days supply | Qty: 30 | Fill #4

## 2017-08-16 MED FILL — CARVEDILOL 25 MG TABLET: 25 | 30 days supply | Qty: 60 | Fill #0

## 2017-08-23 ENCOUNTER — Ambulatory Visit (INDEPENDENT_AMBULATORY_CARE_PROVIDER_SITE_OTHER): Payer: BLUE CROSS/BLUE SHIELD | Admitting: Internal Medicine

## 2017-08-23 ENCOUNTER — Other Ambulatory Visit: Payer: Self-pay | Admitting: Cardiology

## 2017-08-23 ENCOUNTER — Other Ambulatory Visit: Payer: Self-pay

## 2017-08-23 ENCOUNTER — Encounter (INDEPENDENT_AMBULATORY_CARE_PROVIDER_SITE_OTHER): Payer: Self-pay

## 2017-08-23 ENCOUNTER — Telehealth: Payer: Self-pay | Admitting: Internal Medicine

## 2017-08-23 ENCOUNTER — Other Ambulatory Visit (HOSPITAL_COMMUNITY)
Admission: RE | Admit: 2017-08-23 | Discharge: 2017-08-23 | Disposition: A | Discharge status: Home / Self Care | Payer: BLUE CROSS/BLUE SHIELD | Source: Ambulatory Visit | Attending: Internal Medicine | Admitting: Internal Medicine

## 2017-08-23 ENCOUNTER — Encounter: Payer: Self-pay | Admitting: Internal Medicine

## 2017-08-23 VITALS — BP 124/71 | HR 61 | Temp 98.4°F | Ht 70.0 in | Wt 258.7 lb

## 2017-08-23 DIAGNOSIS — E1159 Type 2 diabetes mellitus with other circulatory complications: Secondary | ICD-10-CM | POA: Diagnosis not present

## 2017-08-23 DIAGNOSIS — N184 Chronic kidney disease, stage 4 (severe): Secondary | ICD-10-CM | POA: Diagnosis not present

## 2017-08-23 DIAGNOSIS — Z794 Long term (current) use of insulin: Secondary | ICD-10-CM

## 2017-08-23 DIAGNOSIS — I4819 Other persistent atrial fibrillation: Secondary | ICD-10-CM

## 2017-08-23 DIAGNOSIS — E118 Type 2 diabetes mellitus with unspecified complications: Secondary | ICD-10-CM | POA: Diagnosis not present

## 2017-08-23 DIAGNOSIS — E785 Hyperlipidemia, unspecified: Secondary | ICD-10-CM

## 2017-08-23 DIAGNOSIS — I481 Persistent atrial fibrillation: Secondary | ICD-10-CM | POA: Diagnosis not present

## 2017-08-23 DIAGNOSIS — Z9189 Other specified personal risk factors, not elsewhere classified: Secondary | ICD-10-CM | POA: Insufficient documentation

## 2017-08-23 DIAGNOSIS — I10 Essential (primary) hypertension: Secondary | ICD-10-CM

## 2017-08-23 DIAGNOSIS — E1169 Type 2 diabetes mellitus with other specified complication: Secondary | ICD-10-CM

## 2017-08-23 DIAGNOSIS — I152 Hypertension secondary to endocrine disorders: Secondary | ICD-10-CM

## 2017-08-23 DIAGNOSIS — I5022 Chronic systolic (congestive) heart failure: Secondary | ICD-10-CM | POA: Diagnosis not present

## 2017-08-23 LAB — POCT GLYCOSYLATED HEMOGLOBIN (HGB A1C): Hemoglobin A1C: 7 % — AB (ref 4.0–5.6)

## 2017-08-23 LAB — GLUCOSE, CAPILLARY: Glucose-Capillary: 102 mg/dL — ABNORMAL HIGH (ref 65–99)

## 2017-08-23 MED ORDER — ATORVASTATIN CALCIUM 40 MG PO TABS: 40.0000 mg | ORAL_TABLET | Freq: Every day | ORAL | 3 refills | Status: DC

## 2017-08-23 MED ORDER — INSULIN GLARGINE 100 UNIT/ML ~~LOC~~ SOLN: 10.0000 [IU] | Freq: Every day | SUBCUTANEOUS | 11 refills | Status: DC

## 2017-08-23 MED ORDER — AMLODIPINE BESYLATE 5 MG PO TABS: 5.0000 mg | ORAL_TABLET | Freq: Every day | ORAL | 6 refills | Status: DC

## 2017-08-23 MED FILL — LANTUS 100 UNITS/ML VIAL: 100 | 28 days supply | Qty: 10 | Fill #0

## 2017-08-23 NOTE — Assessment & Plan Note (Addendum)
Last A1c was 6.4 on 12/06/16. He is currently prescribed Lanuts 20 units qhs and Novolog 6 units with breakfast. He says he has been out of his insulin for about 2-3 months. He is not checking his blood sugars but denies any symptoms of hyper/hypoglycemia. He denies any polydipsia, polyuria, lightheadedness, dizziness, numbness/tingling, or blurry vision. He does admit to dietary indiscretion with intake of sweet tea and sodas. A/P: Repeat A1c today is 7.0. His glycemic control is surprisingly stable and well-controlled despite not taking insulin for a prolong period. I will discontinue his mealtime novolog and reduce his basal insulin. - Decrease Lantus to 10 units qhs - Stop novolog - Repeat A1c in 3 months - Refer to ophthalmology

## 2017-08-23 NOTE — Assessment & Plan Note (Signed)
Patient has persistent atrial fibrillation on Coumadin for anticoagulation.  He is on Coreg for rate control and amiodarone for rhythm control/history of VT. He denies any obvious signs of bleeding. A/P: Currently stable.  Rhythm is regular on exam today.  Is tolerating Coumadin well and follows with the cardiology Coumadin clinic. -Continue Coumadin, amiodarone 200 mg daily, Coreg 25 mg twice daily

## 2017-08-23 NOTE — Patient Instructions (Signed)
It was a pleasure to see you Travis Bryan.  Your hemoglobin A1c is 7.0 today.  Please decrease Lantus to 10 units every night.  Stop Novolog.  Continue your other medications as prescribed.  Please work on adjusting your diet.  Please follow up with [place] again in 3 months or sooner if needed.

## 2017-08-23 NOTE — Assessment & Plan Note (Signed)
His hypertension is currently managed with Amlodipine plus his cardiac medications which include Coreg, Hydralazine, Imdur, and Torsemide. BP on arrival was 147/92. BP Readings from Last 3 Encounters:  08/23/17 124/71  07/11/17 (!) 168/102  04/25/17 124/72   A/P: Repeat BP was 124/71 which is well controlled. - Continue Amlodipine 5 mg daily, Coreg 25 mg BID, Hydralazine 100 mg TID, Imdur 90 mg daily, Torsemide 60 mg daily

## 2017-08-23 NOTE — Assessment & Plan Note (Signed)
He has CKD stage IV and is following with nephrology (Dr. Lorrene Reid).  He still makes good urine without dysuria or hematuria. A/P: Currently stable, no indication for dialysis at this time.  We will continue to follow with nephrology.

## 2017-08-23 NOTE — Telephone Encounter (Signed)
Labs were collected this morning (08/23/17). They will likely not result until tomorrow morning at earliest.

## 2017-08-23 NOTE — Assessment & Plan Note (Signed)
He is taking atorvastatin 40 mg daily and reports tolerance. A/P: Continue high intensity atorvastatin 40 mg daily

## 2017-08-23 NOTE — Progress Notes (Signed)
CC: T2DM  HPI:  [name] is a 47 y.o. male with PMH as listed below including HFrEF (EF 25-30%), NICM, Afib/Aflutter s/p ablation on Coumadin, ICD placement, T2DM, HTN, CKD4, and HLD who presents for follow up management of his diabetes. Please see problem based charting for status of patient's chronic medical issues.  Type 2 diabetes mellitus (HCC) Last A1c was 6.4 on 12/06/16. He is currently prescribed Lanuts 20 units qhs and Novolog 6 units with breakfast. He says he has been out of his insulin for about 2-3 months. He is not checking his blood sugars but denies any symptoms of hyper/hypoglycemia. He denies any polydipsia, polyuria, lightheadedness, dizziness, numbness/tingling, or blurry vision. He does admit to dietary indiscretion with intake of sweet tea and sodas. A/P: Repeat A1c today is 7.0. His glycemic control is surprisingly stable and well-controlled despite not taking insulin for a prolong period. I will discontinue his mealtime novolog and reduce his basal insulin. - Decrease Lantus to 10 units qhs - Stop novolog - Repeat A1c in 3 months - Refer to ophthalmology  Hypertension associated with diabetes ([place]) His hypertension is currently managed with Amlodipine plus his cardiac medications which include Coreg, Hydralazine, Imdur, and Torsemide. BP on arrival was 147/92. BP Readings from Last 3 Encounters:  08/23/17 124/71  07/11/17 (!) 168/102  04/25/17 124/72   A/P: Repeat BP was 124/71 which is well controlled. - Continue Amlodipine 5 mg daily, Coreg 25 mg BID, Hydralazine 100 mg TID, Imdur 90 mg daily, Torsemide 60 mg daily  HFrEF (heart failure with reduced ejection fraction) (HCC) Patient has chronic systolic CHF status post ICD placement and is following closely with the heart failure team (Dr. Aundra Bryan) and EP (Dr. Lovena Bryan).  His most recent EF is 25-30%.  His Coreg was recently increased to 25 mg twice daily.  He feels well denies any chest pain,  palpitations, dyspnea, orthopnea, or swelling.  He has noticed some weight gain but attributes this to dietary indiscretion. A/P: He is currently asymptomatic from a heart failure standpoint.  His weight is up but appears euvolemic on exam.  He will continue his current medications and I have advised him on improving his diet and limiting fluids. -Continue Coreg 25 mg twice daily, hydralazine 100 mg 3 times daily, Imdur 90 mg daily, Ranolazine 500 mg twice daily, torsemide 60 mg daily, and potassium 40 a.m. 20 p.m. -Follow-up with cardiology as scheduled  Persistent atrial fibrillation [facility]) Patient has persistent atrial fibrillation on Coumadin for anticoagulation.  He is on Coreg for rate control and amiodarone for rhythm control/history of VT. He denies any obvious signs of bleeding. A/P: Currently stable.  Rhythm is regular on exam today.  Is tolerating Coumadin well and follows with the cardiology Coumadin clinic. -Continue Coumadin, amiodarone 200 mg daily, Coreg 25 mg twice daily  Hyperlipidemia associated with type 2 diabetes mellitus ([place]) He is taking atorvastatin 40 mg daily and reports tolerance. A/P: Continue high intensity atorvastatin 40 mg daily  Chronic kidney disease, stage IV (severe) (HCC) He has CKD stage IV and is following with nephrology (Dr. Lorrene Bryan).  He still makes good urine without dysuria or hematuria. A/P: Currently stable, no indication for dialysis at this time.  We will continue to follow with nephrology.  At risk for sexually transmitted disease due to unprotected sex He is concerned about potential sexually transmitted infection.  He reports unprotected sex with a male partner about 1 to 2 weeks ago which involved oral and vaginal intercourse.  He denies any dysuria, discharge, or penile rash/sore/ulcers. A/P: He is at risk for STI.  Will check urine GC/chlamydia and serum HIV antibody.  He is counseled on safe sex practices.    Past Medical History:    Diagnosis Date  . AICD (automatic cardioverter/defibrillator) present   . Atrial flutter (Travis Bryan)    a. s/p ablation 03/2016  . Chronic systolic CHF (congestive heart failure) (Travis Bryan)   . Essential hypertension 12/26/2005   Qualifier: Diagnosis of  By: Jerilee Hoh MD, Olam Idler    . History of gout   . Hyperlipidemia   . Hypertension   . Myocardial infarction [facility])    "I think I had one a long long time ago" (03/28/2016)  . NICM (nonischemic cardiomyopathy) (Travis Bryan)    a. LHC 6/06: pLAD 20, pLCx 20-30; b. Echo 5/15:  EF 15%, diffuse HK, restrictive physiology, trivial AI, trivial MR, mild LAE, moderate RVE, moderately reduced RVSF, moderate RAE, mild to moderate TR, PASP 43 mmHg  . Obesity   . Persistent atrial fibrillation (Travis Bryan)   . Type II diabetes mellitus (Travis Bryan)    Review of Systems:   Review of Systems  HENT: Negative for nosebleeds.   Eyes: Negative for blurred vision.  Respiratory: Negative for hemoptysis and shortness of breath.   Cardiovascular: Negative for chest pain, palpitations, orthopnea and leg swelling.  Gastrointestinal: Negative for blood in stool and melena.  Genitourinary: Negative for dysuria, frequency and hematuria.  Skin: Negative for rash.  Neurological: Negative for dizziness, tingling and sensory change.  Endo/Heme/Allergies: Negative for polydipsia.   Physical Exam:  Vitals:   08/23/17 0855  BP: (!) 147/92  Pulse: 64  Temp: 98.4 F (36.9 C)  TempSrc: Oral  SpO2: 95%  Weight: 258 lb 11.2 oz (117.3 kg)  Height: 5\' 10"  (1.778 m)   Physical Exam  Constitutional: He is oriented to person, place, and time. He appears well-nourished. No distress.  HENT:  Head: Normocephalic and atraumatic.  Poor oral dentition  Cardiovascular: Normal rate and regular rhythm.  ICD in place left chest  Pulmonary/Chest: Effort normal. No respiratory distress. He has no wheezes. He has no rales.  Musculoskeletal: He exhibits no edema or tenderness.  Neurological: He is alert  and oriented to person, place, and time.  Skin: He is not diaphoretic.     Assessment & Plan:   See Encounters Tab for problem based charting.  Patient discussed with Dr. Dareen Piano

## 2017-08-23 NOTE — Telephone Encounter (Signed)
Patient calling about lab reasults, pls call back

## 2017-08-23 NOTE — Assessment & Plan Note (Signed)
Patient has chronic systolic CHF status post ICD placement and is following closely with the heart failure team (Dr. Aundra Dubin) and EP (Dr. Lovena Le).  His most recent EF is 25-30%.  His Coreg was recently increased to 25 mg twice daily.  He feels well denies any chest pain, palpitations, dyspnea, orthopnea, or swelling.  He has noticed some weight gain but attributes this to dietary indiscretion. A/P: He is currently asymptomatic from a heart failure standpoint.  His weight is up but appears euvolemic on exam.  He will continue his current medications and I have advised him on improving his diet and limiting fluids. -Continue Coreg 25 mg twice daily, hydralazine 100 mg 3 times daily, Imdur 90 mg daily, Ranolazine 500 mg twice daily, torsemide 60 mg daily, and potassium 40 a.m. 20 p.m. -Follow-up with cardiology as scheduled

## 2017-08-23 NOTE — Assessment & Plan Note (Signed)
He is concerned about potential sexually transmitted infection.  He reports unprotected sex with a male partner about 1 to 2 weeks ago which involved oral and vaginal intercourse.  He denies any dysuria, discharge, or penile rash/sore/ulcers. A/P: He is at risk for STI.  Will check urine GC/chlamydia and serum HIV antibody.  He is counseled on safe sex practices.

## 2017-08-24 ENCOUNTER — Telehealth: Payer: Self-pay | Admitting: Internal Medicine

## 2017-08-24 LAB — URINE CYTOLOGY ANCILLARY ONLY
CHLAMYDIA, DNA PROBE: NEGATIVE
NEISSERIA GONORRHEA: NEGATIVE

## 2017-08-24 LAB — HIV ANTIBODY (ROUTINE TESTING W REFLEX): HIV Screen 4th Generation wRfx: NONREACTIVE

## 2017-08-24 MED FILL — WARFARIN SODIUM 5 MG TABLET: 5 | 30 days supply | Qty: 45 | Fill #3

## 2017-08-24 MED FILL — AMIODARONE HCL 200 MG TAB: 200 | 30 days supply | Qty: 30 | Fill #1

## 2017-08-24 NOTE — Telephone Encounter (Signed)
Left HIPPA compliant message on voicemail.

## 2017-08-24 NOTE — Telephone Encounter (Signed)
Patient missed a call, patient is at work, would like for you to call back and leave a message

## 2017-08-27 NOTE — Progress Notes (Signed)
Internal Medicine Clinic Attending  Case discussed with Dr. Patel at the time of the visit.  We reviewed the resident's history and exam and pertinent patient test results.  I agree with the assessment, diagnosis, and plan of care documented in the resident's note.  

## 2017-08-27 NOTE — Telephone Encounter (Signed)
HIV antibody non-reactive. Urine GC/Chlamydia negative. Discussed results with patient by phone 08/27/17.

## 2017-08-28 MED FILL — TORSEMIDE 20 MG TABLET: 20 | 30 days supply | Qty: 90 | Fill #1

## 2017-09-03 MED FILL — hydrALAZINE HCL 100 MG TABS: 100 | 30 days supply | Qty: 90 | Fill #4

## 2017-09-07 ENCOUNTER — Other Ambulatory Visit (HOSPITAL_COMMUNITY): Payer: Self-pay | Admitting: Cardiology

## 2017-09-07 MED FILL — ATORVASTATIN 40 MG TABLET: 40 | 90 days supply | Qty: 90 | Fill #0

## 2017-09-17 MED FILL — CARVEDILOL 25 MG TABLET: 25 | 30 days supply | Qty: 60 | Fill #1

## 2017-09-19 ENCOUNTER — Other Ambulatory Visit (HOSPITAL_COMMUNITY): Payer: Self-pay | Admitting: Cardiology

## 2017-09-19 ENCOUNTER — Ambulatory Visit (INDEPENDENT_AMBULATORY_CARE_PROVIDER_SITE_OTHER): Payer: BLUE CROSS/BLUE SHIELD | Admitting: *Deleted

## 2017-09-19 DIAGNOSIS — I428 Other cardiomyopathies: Secondary | ICD-10-CM

## 2017-09-19 DIAGNOSIS — I5022 Chronic systolic (congestive) heart failure: Secondary | ICD-10-CM

## 2017-09-19 MED FILL — ISOSORBIDE MN ER 30 MG TAB: 30 | 90 days supply | Qty: 270 | Fill #0

## 2017-09-19 MED FILL — CALCITRIOL 0.25 MCG CAPS: 0.25 | 30 days supply | Qty: 30 | Fill #2

## 2017-09-19 MED FILL — RANOLAZINE ER 500 MG TB12: 500 | 30 days supply | Qty: 60 | Fill #4

## 2017-09-19 MED FILL — AMLODIPINE BESYLATE 5 MG TA: 5 | 30 days supply | Qty: 30 | Fill #5

## 2017-09-20 MED FILL — POTASSIUM CL ER 20 MEQ TAB: 20 | 30 days supply | Qty: 90 | Fill #0

## 2017-09-20 NOTE — Progress Notes (Signed)
Remote ICD transmission.   

## 2017-09-24 ENCOUNTER — Ambulatory Visit (INDEPENDENT_AMBULATORY_CARE_PROVIDER_SITE_OTHER): Payer: BLUE CROSS/BLUE SHIELD | Admitting: *Deleted

## 2017-09-24 DIAGNOSIS — I483 Typical atrial flutter: Secondary | ICD-10-CM | POA: Diagnosis not present

## 2017-09-24 DIAGNOSIS — I481 Persistent atrial fibrillation: Secondary | ICD-10-CM

## 2017-09-24 DIAGNOSIS — Z5181 Encounter for therapeutic drug level monitoring: Secondary | ICD-10-CM | POA: Diagnosis not present

## 2017-09-24 DIAGNOSIS — I4819 Other persistent atrial fibrillation: Secondary | ICD-10-CM

## 2017-09-24 DIAGNOSIS — Z95810 Presence of automatic (implantable) cardiac defibrillator: Secondary | ICD-10-CM

## 2017-09-24 LAB — POCT INR: INR: 2.4 (ref 2.0–3.0)

## 2017-09-24 NOTE — Patient Instructions (Signed)
Description   Continue taking 1 tablet daily except 1.5 tablets on Mondays, Wednesdays, and Fridays.  Recheck in 6 weeks.  Coumadin Clinic [phone]. Do 2 servings of greens a week

## 2017-09-26 MED FILL — TORSEMIDE 20 MG TABLET: 20 | 30 days supply | Qty: 90 | Fill #2

## 2017-09-26 MED FILL — ALLOPURINOL 300 MG TABLET: 300 | 30 days supply | Qty: 30 | Fill #2

## 2017-09-26 MED FILL — AMIODARONE HCL 200 MG TAB: 200 | 30 days supply | Qty: 30 | Fill #2

## 2017-09-28 ENCOUNTER — Other Ambulatory Visit: Payer: Self-pay | Admitting: Cardiology

## 2017-10-01 MED FILL — WARFARIN SODIUM 5 MG TABLET: 5 | 30 days supply | Qty: 45 | Fill #0

## 2017-10-02 ENCOUNTER — Encounter: Payer: Self-pay | Admitting: *Deleted

## 2017-10-09 MED FILL — hydrALAZINE HCL 100 MG TABS: 100 | 30 days supply | Qty: 90 | Fill #5

## 2017-10-15 MED FILL — CARVEDILOL 25 MG TABLET: 25 | 30 days supply | Qty: 60 | Fill #2

## 2017-10-18 MED FILL — RANOLAZINE ER 500 MG TB12: 500 | 30 days supply | Qty: 60 | Fill #5

## 2017-10-18 MED FILL — POTASSIUM CL ER 20 MEQ TAB: 20 | 30 days supply | Qty: 90 | Fill #1

## 2017-10-18 MED FILL — CALCITRIOL 0.25 MCG CAPS: 0.25 | 30 days supply | Qty: 30 | Fill #3

## 2017-10-18 MED FILL — AMLODIPINE BESYLATE 5 MG TA: 5 | 30 days supply | Qty: 30 | Fill #6

## 2017-10-24 LAB — CUP PACEART REMOTE DEVICE CHECK
Battery Remaining Longevity: 71 mo
Date Time Interrogation Session: 20190710155313
HighPow Impedance: 54 Ohm
HighPow Impedance: 55 Ohm
Implantable Lead Location: 753860
Implantable Lead Model: 7121
Implantable Pulse Generator Implant Date: 20150617
Lead Channel Impedance Value: 340 Ohm
Lead Channel Setting Pacing Amplitude: 2.5 V
Lead Channel Setting Pacing Pulse Width: 0.5 ms
MDC IDC LEAD IMPLANT DT: 20090211
MDC IDC MSMT BATTERY REMAINING PERCENTAGE: 67 %
MDC IDC MSMT BATTERY VOLTAGE: 2.96 V
MDC IDC MSMT LEADCHNL RV PACING THRESHOLD AMPLITUDE: 1 V
MDC IDC MSMT LEADCHNL RV PACING THRESHOLD PULSEWIDTH: 0.5 ms
MDC IDC MSMT LEADCHNL RV SENSING INTR AMPL: 11.8 mV
MDC IDC SET LEADCHNL RV SENSING SENSITIVITY: 0.5 mV
MDC IDC STAT BRADY RV PERCENT PACED: 1 %
Pulse Gen Serial Number: 7200640

## 2017-10-24 MED FILL — AMIODARONE HCL 200 MG TAB: 200 | 30 days supply | Qty: 30 | Fill #3

## 2017-10-29 MED FILL — LANTUS 100 UNITS/ML VIAL: 100 | 28 days supply | Qty: 10 | Fill #1

## 2017-10-29 MED FILL — ALLOPURINOL 300 MG TABS: 300 | 30 days supply | Qty: 30 | Fill #3

## 2017-10-29 MED FILL — WARFARIN SODIUM 5 MG TABLET: 5 | 30 days supply | Qty: 45 | Fill #1

## 2017-10-29 MED FILL — TORSEMIDE 20 MG TABLET: 20 | 30 days supply | Qty: 90 | Fill #3

## 2017-11-05 ENCOUNTER — Ambulatory Visit (INDEPENDENT_AMBULATORY_CARE_PROVIDER_SITE_OTHER): Payer: BLUE CROSS/BLUE SHIELD | Admitting: Pharmacist

## 2017-11-05 DIAGNOSIS — I4819 Other persistent atrial fibrillation: Secondary | ICD-10-CM

## 2017-11-05 DIAGNOSIS — Z5181 Encounter for therapeutic drug level monitoring: Secondary | ICD-10-CM | POA: Diagnosis not present

## 2017-11-05 DIAGNOSIS — I483 Typical atrial flutter: Secondary | ICD-10-CM

## 2017-11-05 DIAGNOSIS — I481 Persistent atrial fibrillation: Secondary | ICD-10-CM

## 2017-11-05 DIAGNOSIS — Z95810 Presence of automatic (implantable) cardiac defibrillator: Secondary | ICD-10-CM | POA: Diagnosis not present

## 2017-11-05 LAB — POCT INR: INR: 2.5 (ref 2.0–3.0)

## 2017-11-05 NOTE — Patient Instructions (Signed)
Description   Continue taking 1 tablet daily except 1.5 tablets on Mondays, Wednesdays, and Fridays.  Recheck in 6 weeks.  Coumadin Clinic [phone]. Do 2 servings of greens a week

## 2017-11-08 NOTE — Addendum Note (Signed)
Addended by: Hulan Fray on: 11/08/2017 06:19 PM   Modules accepted: Orders

## 2017-11-09 ENCOUNTER — Other Ambulatory Visit (HOSPITAL_COMMUNITY): Payer: Self-pay | Admitting: Cardiology

## 2017-11-09 MED FILL — hydrALAZINE HCL 100 MG TABS: 100 | 30 days supply | Qty: 90 | Fill #0

## 2017-11-15 MED FILL — CARVEDILOL 25 MG TABLET: 25 | 30 days supply | Qty: 60 | Fill #3

## 2017-11-15 MED FILL — AMLODIPINE BESYLATE 5 MG TA: 5 | 30 days supply | Qty: 30 | Fill #0

## 2017-11-19 MED FILL — POTASSIUM CL ER 20 MEQ TAB: 20 | 30 days supply | Qty: 90 | Fill #2

## 2017-11-19 MED FILL — RANOLAZINE ER 500 MG TB12: 500 | 30 days supply | Qty: 60 | Fill #6

## 2017-11-26 ENCOUNTER — Other Ambulatory Visit (HOSPITAL_COMMUNITY): Payer: Self-pay | Admitting: Cardiology

## 2017-11-26 MED FILL — AMIODARONE HCL 200 MG TAB: 200 | 30 days supply | Qty: 30 | Fill #4

## 2017-11-27 MED FILL — TORSEMIDE 20 MG TABLET: 20 | 30 days supply | Qty: 90 | Fill #0

## 2017-11-29 MED FILL — ALLOPURINOL 300 MG TABS: 300 | 30 days supply | Qty: 30 | Fill #0

## 2017-11-29 MED FILL — CALCITRIOL 0.25 MCG CAPS: 0.25 | 30 days supply | Qty: 30 | Fill #4

## 2017-12-14 ENCOUNTER — Telehealth (HOSPITAL_COMMUNITY): Payer: Self-pay | Admitting: Student

## 2017-12-14 MED FILL — CARVEDILOL 25 MG TABLET: 25 | 30 days supply | Qty: 60 | Fill #0

## 2017-12-14 MED FILL — AMLODIPINE BESYLATE 5 MG TA: 5 | 30 days supply | Qty: 30 | Fill #1

## 2017-12-14 MED FILL — WARFARIN SODIUM 5 MG TABLET: 5 | 30 days supply | Qty: 45 | Fill #2

## 2017-12-14 NOTE — Telephone Encounter (Signed)
 *  STAT* If patient is at the pharmacy, call can be transferred to refill team.   1. Which medications need to be refilled? (please list name of each medication and dose if known) carvedilol (COREG) 25 MG tablet  2. Which pharmacy/location (including street and city if local pharmacy) is medication to be sent to? [facility]  3. Do they need a 30 day or 90 day supply? [place]

## 2017-12-17 ENCOUNTER — Ambulatory Visit (INDEPENDENT_AMBULATORY_CARE_PROVIDER_SITE_OTHER): Payer: BLUE CROSS/BLUE SHIELD

## 2017-12-17 ENCOUNTER — Encounter (INDEPENDENT_AMBULATORY_CARE_PROVIDER_SITE_OTHER): Payer: Self-pay

## 2017-12-17 DIAGNOSIS — Z5181 Encounter for therapeutic drug level monitoring: Secondary | ICD-10-CM | POA: Diagnosis not present

## 2017-12-17 DIAGNOSIS — Z95810 Presence of automatic (implantable) cardiac defibrillator: Secondary | ICD-10-CM

## 2017-12-17 DIAGNOSIS — I4819 Other persistent atrial fibrillation: Secondary | ICD-10-CM

## 2017-12-17 DIAGNOSIS — I483 Typical atrial flutter: Secondary | ICD-10-CM

## 2017-12-17 LAB — POCT INR: INR: 2.5 (ref 2.0–3.0)

## 2017-12-17 NOTE — Patient Instructions (Signed)
Description   Continue taking 1 tablet daily except 1.5 tablets on Mondays, Wednesdays, and Fridays.  Recheck in 6 weeks.  Coumadin Clinic [phone]. Do 2 servings of greens a week

## 2017-12-19 ENCOUNTER — Other Ambulatory Visit (HOSPITAL_COMMUNITY): Payer: Self-pay | Admitting: Cardiology

## 2017-12-19 ENCOUNTER — Ambulatory Visit (INDEPENDENT_AMBULATORY_CARE_PROVIDER_SITE_OTHER): Payer: BLUE CROSS/BLUE SHIELD | Admitting: *Deleted

## 2017-12-19 DIAGNOSIS — I428 Other cardiomyopathies: Secondary | ICD-10-CM | POA: Diagnosis not present

## 2017-12-19 DIAGNOSIS — I5022 Chronic systolic (congestive) heart failure: Secondary | ICD-10-CM

## 2017-12-19 MED FILL — ISOSORBIDE MN ER 30 MG TAB: 30 | 90 days supply | Qty: 270 | Fill #1

## 2017-12-19 MED FILL — RANOLAZINE ER 500 MG TB12: 500 | 30 days supply | Qty: 60 | Fill #0

## 2017-12-19 MED FILL — POTASSIUM CL ER 20 MEQ TAB: 20 | 30 days supply | Qty: 90 | Fill #3

## 2017-12-19 MED FILL — ATORVASTATIN 40 MG TABLET: 40 | 90 days supply | Qty: 90 | Fill #1

## 2017-12-19 MED FILL — hydrALAZINE HCL 100 MG TABS: 100 | 30 days supply | Qty: 90 | Fill #1

## 2017-12-20 NOTE — Progress Notes (Signed)
Remote ICD transmission.   

## 2017-12-26 ENCOUNTER — Other Ambulatory Visit: Payer: Self-pay

## 2017-12-26 ENCOUNTER — Ambulatory Visit (INDEPENDENT_AMBULATORY_CARE_PROVIDER_SITE_OTHER): Payer: BLUE CROSS/BLUE SHIELD | Admitting: Internal Medicine

## 2017-12-26 ENCOUNTER — Encounter: Payer: Self-pay | Admitting: Internal Medicine

## 2017-12-26 VITALS — BP 140/91 | HR 60 | Temp 98.1°F | Ht 70.0 in | Wt 268.2 lb

## 2017-12-26 DIAGNOSIS — E118 Type 2 diabetes mellitus with unspecified complications: Secondary | ICD-10-CM

## 2017-12-26 DIAGNOSIS — Z79899 Other long term (current) drug therapy: Secondary | ICD-10-CM

## 2017-12-26 DIAGNOSIS — Z95810 Presence of automatic (implantable) cardiac defibrillator: Secondary | ICD-10-CM

## 2017-12-26 DIAGNOSIS — I5022 Chronic systolic (congestive) heart failure: Secondary | ICD-10-CM

## 2017-12-26 DIAGNOSIS — E785 Hyperlipidemia, unspecified: Secondary | ICD-10-CM

## 2017-12-26 DIAGNOSIS — E1122 Type 2 diabetes mellitus with diabetic chronic kidney disease: Secondary | ICD-10-CM

## 2017-12-26 DIAGNOSIS — I130 Hypertensive heart and chronic kidney disease with heart failure and stage 1 through stage 4 chronic kidney disease, or unspecified chronic kidney disease: Secondary | ICD-10-CM

## 2017-12-26 DIAGNOSIS — Z0000 Encounter for general adult medical examination without abnormal findings: Secondary | ICD-10-CM

## 2017-12-26 DIAGNOSIS — Z794 Long term (current) use of insulin: Secondary | ICD-10-CM | POA: Diagnosis not present

## 2017-12-26 DIAGNOSIS — I10 Essential (primary) hypertension: Secondary | ICD-10-CM

## 2017-12-26 DIAGNOSIS — Z2821 Immunization not carried out because of patient refusal: Secondary | ICD-10-CM

## 2017-12-26 DIAGNOSIS — E1169 Type 2 diabetes mellitus with other specified complication: Secondary | ICD-10-CM

## 2017-12-26 DIAGNOSIS — E1159 Type 2 diabetes mellitus with other circulatory complications: Secondary | ICD-10-CM

## 2017-12-26 DIAGNOSIS — N184 Chronic kidney disease, stage 4 (severe): Secondary | ICD-10-CM | POA: Diagnosis not present

## 2017-12-26 DIAGNOSIS — I152 Hypertension secondary to endocrine disorders: Secondary | ICD-10-CM

## 2017-12-26 DIAGNOSIS — Z7901 Long term (current) use of anticoagulants: Secondary | ICD-10-CM

## 2017-12-26 DIAGNOSIS — I4819 Other persistent atrial fibrillation: Secondary | ICD-10-CM

## 2017-12-26 LAB — POCT GLYCOSYLATED HEMOGLOBIN (HGB A1C): Hemoglobin A1C: 6.7 % — AB (ref 4.0–5.6)

## 2017-12-26 LAB — GLUCOSE, CAPILLARY: Glucose-Capillary: 122 mg/dL — ABNORMAL HIGH (ref 70–99)

## 2017-12-26 MED ORDER — AMIODARONE HCL 200 MG PO TABS: 200.0000 mg | ORAL_TABLET | Freq: Every day | ORAL | 5 refills | Status: DC

## 2017-12-26 MED ORDER — TORSEMIDE 20 MG PO TABS: 60.0000 mg | ORAL_TABLET | Freq: Every day | ORAL | 5 refills | Status: DC

## 2017-12-26 MED FILL — AMIODARONE HCL 200 MG TAB: 200 | 30 days supply | Qty: 30 | Fill #0

## 2017-12-26 MED FILL — TORSEMIDE 20 MG TABLET: 20 | 30 days supply | Qty: 90 | Fill #0

## 2017-12-26 NOTE — Patient Instructions (Signed)
Thank you so much for allowing me to care for you today.  Please follow-up with the clinic if you have any questions or concerns.

## 2017-12-26 NOTE — Progress Notes (Signed)
CC: Follow-up for diabetes, hypertension, heart failure  HPI:  Travis Bryan is a 47 y.o. male with a history of heart failure with reduced ejection fraction (EF 25 to 30%), atrial fibrillation/atrial flutter status post ablation on Coumadin, ICD placement, type 2 diabetes, hypertension, CKD stage IV, and hyperlipidemia who presents to establish care with new primary care doctor.    Type 2 diabetes At his last clinic visit he reported poor compliance with insulin.  His A1c four months ago was 7.0.  He was asked to restart his Lantus at a lower dose of 10 units nightly.  [name] reports that he never started his Lantus and has not had any insulin "longer than he can remember".his current A1c is 6.7 and capillary glucose is 122.  He was referred to ophthalmology and reports that he never heard from them.  Hypertension His hypertension is currently managed with amlodipine 5 mg QD plus his cardiac medications which include carvedilol 25 mg QD, hydralazine 100 mg QD, Imdur 90 mg QD, and torsemide 60 mg QD.  Blood pressure on arrival was 140/91.  His blood pressure has remained elevated throughout several clinic visits in the past year.  Heart failure with reduced ejection fraction His chronic systolic heart failure status post ICD placement is monitored by Dr. Aundra Dubin (heart failure team) and Dr. Lovena Le (EP).  He has not had appointment with them since January of this year.  His weight today is has increased by 10 pounds in the last 4 months.  He states that he has been eating poorly over the last several months. He denies shortness of breath, chest pain, dyspnea, palpitations, leg swelling. On carvedilol 25 mg QD, hydralazine 100 mg QD, Imdur 90 mg QD, and torsemide 60 mg QD.  Persistent atrial fibrillation He is currently on Coumadin for anticoagulation.  He is on Coreg for rate control and amiodarone for rhythm/history of VT.  His rhythm today is normal sinus rhythm.  Hyperlipidemia He is  taking atorvastatin 40 mg daily and reports compliance.  Health maintenance [name] does not want a flu shot today.  I explained to him the benefits and risks associated with the flu shot. He states he is scared of getting sick and does not want it.   Past Medical History:  Diagnosis Date  . AICD (automatic cardioverter/defibrillator) present   . Atrial flutter ([place])    a. s/p ablation 03/2016  . Chronic systolic CHF (congestive heart failure) ([place])   . Essential hypertension 12/26/2005   Qualifier: Diagnosis of  By: Jerilee Hoh MD, Olam Idler    . History of gout   . Hyperlipidemia   . Hypertension   . Myocardial infarction [facility])    "I think I had one a long long time ago" (03/28/2016)  . NICM (nonischemic cardiomyopathy) ([place])    a. LHC 6/06: pLAD 20, pLCx 20-30; b. Echo 5/15:  EF 15%, diffuse HK, restrictive physiology, trivial AI, trivial MR, mild LAE, moderate RVE, moderately reduced RVSF, moderate RAE, mild to moderate TR, PASP 43 mmHg  . Obesity   . Persistent atrial fibrillation ([place])   . Type II diabetes mellitus ([place])    Review of Systems:  Review of Systems  Constitutional: Negative for chills and fever.  HENT: Negative for congestion and sore throat.   Respiratory: Negative for cough and shortness of breath.   Cardiovascular: Negative for chest pain, palpitations and leg swelling.  Gastrointestinal: Negative for abdominal pain, constipation, diarrhea and vomiting.  Genitourinary: Negative for dysuria, frequency  and hematuria.  Skin: Negative for rash.  Neurological: Negative for dizziness, weakness and headaches.  All other systems reviewed and are negative.   Physical Exam:  Vitals:   12/26/17 1333  BP: (!) 140/91  Pulse: 60  Temp: 98.1 F (36.7 C)  TempSrc: Oral  SpO2: 94%  Weight: 268 lb 3.2 oz (121.7 kg)  Height: 5\' 10"  (1.778 m)   Physical Exam  Constitutional: He appears well-developed and well-nourished.  HENT:  Head: Normocephalic and atraumatic.    Eyes: Conjunctivae and EOM are normal.  Neck: Normal range of motion.  Cardiovascular: Normal rate, regular rhythm, normal heart sounds and intact distal pulses.  Pulmonary/Chest: Effort normal and breath sounds normal.  Abdominal: Soft. Bowel sounds are normal.  Musculoskeletal: He exhibits no edema or tenderness.  Neurological: He is alert.  Skin: Skin is warm and dry.  Psychiatric: He has a normal mood and affect. His behavior is normal.  Nursing note and vitals reviewed.   Assessment & Plan:   See Encounters Tab for problem based charting.  Patient seen with Dr. Rebeca Alert

## 2017-12-27 NOTE — Assessment & Plan Note (Addendum)
Assessment: Travis Bryan blood sugar is surprisingly very well controlled (A1c 6.7) despite not using his Lantus 10 units nightly.  His capillary glucose was found to be 122 today.  I encouraged lifestyle modifications to decrease weight and improve glycemic control.  Plan: 1.  Hold off on restarting insulin 2.  Recheck A1c in 3 months

## 2017-12-27 NOTE — Assessment & Plan Note (Signed)
Assessment: Travis Bryan has hyperlipidemia and is currently on atorvastatin 40 mg daily.  He reports good compliance.  Plan: 1.  Continue atorvastatin 40 mg daily

## 2017-12-27 NOTE — Assessment & Plan Note (Signed)
Assessment: Travis Bryan has atrial fibrillation on Coumadin for anticoagulation.  He is on Coreg for rate control and amiodarone for rhythm control/history of VT. He is in normal sinus rhythm today.  Plan: 1.  Continue Coumadin, amiodarone 200 mg daily, Coreg 25 mg twice daily.

## 2017-12-27 NOTE — Assessment & Plan Note (Signed)
Assessment: Travis Bryan has a history of chronic systolic CHF status post ICD placement and is being followed by Dr. Aundra Dubin (heart failure) and Dr. Lovena Le (EP).  He does not have any signs or symptoms consistent with heart failure exacerbation (no shortness of breath, palpitations, chest pain, orthopnea, leg swelling) and appears euvolemic on exam.  He is taking all of his heart failure medications without difficulty.  He does have a weight gain of 10 pounds in the last 4 months but believes that he has been eating out more recently.  Plan: 1.  Continue Coreg 25 mg twice daily, hydralazine 100 mg 3 times daily, Imdur 90 mg daily, Levazine 500 mg daily, torsemide 60 mg daily, and potassium 40 mEq in the morning and 20 mEq at night. 2.  Encourage following up with heart failure doctor.

## 2017-12-27 NOTE — Assessment & Plan Note (Signed)
Assessment: His hypertension is currently managed with amlodipine 5 mg QD plus his cardiac medications which include carvedilol 25 mg QD, hydralazine 100 mg QD, Imdur 90 mg QD, and torsemide 60 mg QD. His blood pressure has remained elevated over the last several clinic visits.  Today his blood pressure is 140/91.  Given his multiple medications that affect both blood pressure and heart failure, I will defer increasing any one medication or adding another antihypertensive to his heart failure doctor.  He has an upcoming appointment with his cardiologist.  Plan: 1.  Hold on changing antihypertensive medications.  Will continue amlodipine 5 mg QD, carvedilol 25 mg QD, hydralazine 100 mg QD, Imdur 90 mg QD, and torsemide 60 mg QD.

## 2017-12-27 NOTE — Assessment & Plan Note (Signed)
Assessment/plan: After a long discussion about the flu shot, Travis Bryan declined it today.

## 2018-01-01 NOTE — Progress Notes (Signed)
Internal Medicine Clinic Attending  I saw and evaluated the patient.  I personally confirmed the key portions of the history and exam documented by Dr. Alfonse Spruce and I reviewed pertinent patient test results.  The assessment, diagnosis, and plan were formulated together and I agree with the documentation in the resident's note.  Could benefit from improved BP control, challenging given his HF and CKD stage IV. Would be interested in nephrology opinion on safety of ACE-I or ARB for him.   Lenice Pressman, M.D., Ph.D.

## 2018-01-16 ENCOUNTER — Other Ambulatory Visit (HOSPITAL_COMMUNITY): Payer: Self-pay | Admitting: Cardiology

## 2018-01-16 MED FILL — POTASSIUM CL ER 20 MEQ TAB: 20 | 30 days supply | Qty: 90 | Fill #0

## 2018-01-16 MED FILL — RANOLAZINE ER 500 MG TB12: 500 | 30 days supply | Qty: 60 | Fill #1

## 2018-01-16 MED FILL — CARVEDILOL 25 MG TABLET: 25 | 30 days supply | Qty: 60 | Fill #1

## 2018-01-16 MED FILL — AMLODIPINE BESYLATE 5 MG TA: 5 | 30 days supply | Qty: 30 | Fill #2

## 2018-01-16 MED FILL — CALCITRIOL 0.25 MCG CAPS: 0.25 | 30 days supply | Qty: 30 | Fill #0

## 2018-01-23 ENCOUNTER — Other Ambulatory Visit: Payer: Self-pay | Admitting: Cardiology

## 2018-01-23 MED FILL — WARFARIN SODIUM 5 MG TABLET: 5 | 30 days supply | Qty: 45 | Fill #0

## 2018-01-23 MED FILL — ALLOPURINOL 300 MG TABS: 300 | 30 days supply | Qty: 30 | Fill #1

## 2018-01-23 MED FILL — AMIODARONE HCL 200 MG TAB: 200 | 30 days supply | Qty: 30 | Fill #1

## 2018-01-28 ENCOUNTER — Ambulatory Visit (INDEPENDENT_AMBULATORY_CARE_PROVIDER_SITE_OTHER): Payer: Self-pay | Admitting: *Deleted

## 2018-01-28 DIAGNOSIS — I4819 Other persistent atrial fibrillation: Secondary | ICD-10-CM

## 2018-01-28 DIAGNOSIS — Z5181 Encounter for therapeutic drug level monitoring: Secondary | ICD-10-CM

## 2018-01-28 DIAGNOSIS — Z95810 Presence of automatic (implantable) cardiac defibrillator: Secondary | ICD-10-CM

## 2018-01-28 DIAGNOSIS — I483 Typical atrial flutter: Secondary | ICD-10-CM

## 2018-01-28 LAB — POCT INR: INR: 1.9 — AB (ref 2.0–3.0)

## 2018-01-28 MED FILL — TORSEMIDE 20 MG TABLET: 20 | 30 days supply | Qty: 90 | Fill #1

## 2018-01-28 MED FILL — hydrALAZINE HCL 100 MG TABS: 100 | 30 days supply | Qty: 90 | Fill #2

## 2018-01-28 NOTE — Patient Instructions (Signed)
Description   Today take 2 tablets then continue taking 1 tablet daily except 1.5 tablets on Mondays, Wednesdays, and Fridays.  Recheck in 5 weeks.  Coumadin Clinic [phone]. Do 2 servings of greens a week

## 2018-02-15 MED FILL — POTASSIUM CHLORIDE CRYS ER: 20 | 30 days supply | Qty: 90 | Fill #1

## 2018-02-15 MED FILL — CARVEDILOL 25 MG TABLET: 25 | 30 days supply | Qty: 60 | Fill #2

## 2018-02-15 MED FILL — AMLODIPINE BESYLATE 5 MG TA: 5 | 30 days supply | Qty: 30 | Fill #3

## 2018-02-15 MED FILL — RANOLAZINE ER 500 MG TB12: 500 | 7 days supply | Qty: 14 | Fill #2

## 2018-02-25 ENCOUNTER — Emergency Department (HOSPITAL_COMMUNITY): Payer: Medicaid Other

## 2018-02-25 ENCOUNTER — Encounter (HOSPITAL_COMMUNITY): Payer: Self-pay | Admitting: Emergency Medicine

## 2018-02-25 ENCOUNTER — Inpatient Hospital Stay (HOSPITAL_COMMUNITY)
Admission: EM | Admit: 2018-02-25 | Discharge: 2018-02-28 | DRG: 871 | Disposition: A | Discharge status: Home / Self Care | Payer: Medicaid Other | Attending: Internal Medicine | Admitting: Internal Medicine

## 2018-02-25 ENCOUNTER — Other Ambulatory Visit: Payer: Self-pay

## 2018-02-25 DIAGNOSIS — I4510 Unspecified right bundle-branch block: Secondary | ICD-10-CM | POA: Diagnosis present

## 2018-02-25 DIAGNOSIS — E1110 Type 2 diabetes mellitus with ketoacidosis without coma: Secondary | ICD-10-CM | POA: Diagnosis present

## 2018-02-25 DIAGNOSIS — Z833 Family history of diabetes mellitus: Secondary | ICD-10-CM

## 2018-02-25 DIAGNOSIS — I4891 Unspecified atrial fibrillation: Secondary | ICD-10-CM | POA: Diagnosis present

## 2018-02-25 DIAGNOSIS — R509 Fever, unspecified: Secondary | ICD-10-CM | POA: Diagnosis present

## 2018-02-25 DIAGNOSIS — E785 Hyperlipidemia, unspecified: Secondary | ICD-10-CM | POA: Diagnosis present

## 2018-02-25 DIAGNOSIS — I2510 Atherosclerotic heart disease of native coronary artery without angina pectoris: Secondary | ICD-10-CM | POA: Diagnosis present

## 2018-02-25 DIAGNOSIS — E1159 Type 2 diabetes mellitus with other circulatory complications: Secondary | ICD-10-CM | POA: Diagnosis present

## 2018-02-25 DIAGNOSIS — I5023 Acute on chronic systolic (congestive) heart failure: Secondary | ICD-10-CM | POA: Diagnosis present

## 2018-02-25 DIAGNOSIS — N179 Acute kidney failure, unspecified: Secondary | ICD-10-CM | POA: Diagnosis present

## 2018-02-25 DIAGNOSIS — I152 Hypertension secondary to endocrine disorders: Secondary | ICD-10-CM | POA: Diagnosis present

## 2018-02-25 DIAGNOSIS — J181 Lobar pneumonia, unspecified organism: Secondary | ICD-10-CM

## 2018-02-25 DIAGNOSIS — A419 Sepsis, unspecified organism: Principal | ICD-10-CM | POA: Diagnosis present

## 2018-02-25 DIAGNOSIS — I252 Old myocardial infarction: Secondary | ICD-10-CM

## 2018-02-25 DIAGNOSIS — M109 Gout, unspecified: Secondary | ICD-10-CM | POA: Diagnosis present

## 2018-02-25 DIAGNOSIS — Z95810 Presence of automatic (implantable) cardiac defibrillator: Secondary | ICD-10-CM | POA: Diagnosis not present

## 2018-02-25 DIAGNOSIS — E1122 Type 2 diabetes mellitus with diabetic chronic kidney disease: Secondary | ICD-10-CM | POA: Diagnosis present

## 2018-02-25 DIAGNOSIS — Z23 Encounter for immunization: Secondary | ICD-10-CM

## 2018-02-25 DIAGNOSIS — I132 Hypertensive heart and chronic kidney disease with heart failure and with stage 5 chronic kidney disease, or end stage renal disease: Secondary | ICD-10-CM | POA: Diagnosis present

## 2018-02-25 DIAGNOSIS — J189 Pneumonia, unspecified organism: Secondary | ICD-10-CM | POA: Diagnosis present

## 2018-02-25 DIAGNOSIS — I5022 Chronic systolic (congestive) heart failure: Secondary | ICD-10-CM | POA: Diagnosis present

## 2018-02-25 DIAGNOSIS — I428 Other cardiomyopathies: Secondary | ICD-10-CM | POA: Diagnosis present

## 2018-02-25 DIAGNOSIS — Z6836 Body mass index (BMI) 36.0-36.9, adult: Secondary | ICD-10-CM

## 2018-02-25 DIAGNOSIS — Z7901 Long term (current) use of anticoagulants: Secondary | ICD-10-CM

## 2018-02-25 DIAGNOSIS — R809 Proteinuria, unspecified: Secondary | ICD-10-CM | POA: Diagnosis present

## 2018-02-25 DIAGNOSIS — Z8249 Family history of ischemic heart disease and other diseases of the circulatory system: Secondary | ICD-10-CM

## 2018-02-25 DIAGNOSIS — R823 Hemoglobinuria: Secondary | ICD-10-CM | POA: Diagnosis present

## 2018-02-25 DIAGNOSIS — R0902 Hypoxemia: Secondary | ICD-10-CM | POA: Diagnosis present

## 2018-02-25 DIAGNOSIS — N189 Chronic kidney disease, unspecified: Secondary | ICD-10-CM

## 2018-02-25 DIAGNOSIS — R05 Cough: Secondary | ICD-10-CM

## 2018-02-25 DIAGNOSIS — R778 Other specified abnormalities of plasma proteins: Secondary | ICD-10-CM

## 2018-02-25 DIAGNOSIS — E1310 Other specified diabetes mellitus with ketoacidosis without coma: Secondary | ICD-10-CM

## 2018-02-25 DIAGNOSIS — N185 Chronic kidney disease, stage 5: Secondary | ICD-10-CM | POA: Diagnosis present

## 2018-02-25 DIAGNOSIS — Z79899 Other long term (current) drug therapy: Secondary | ICD-10-CM

## 2018-02-25 DIAGNOSIS — I482 Chronic atrial fibrillation, unspecified: Secondary | ICD-10-CM | POA: Diagnosis present

## 2018-02-25 DIAGNOSIS — E669 Obesity, unspecified: Secondary | ICD-10-CM | POA: Diagnosis present

## 2018-02-25 DIAGNOSIS — I248 Other forms of acute ischemic heart disease: Secondary | ICD-10-CM | POA: Diagnosis present

## 2018-02-25 DIAGNOSIS — R059 Cough, unspecified: Secondary | ICD-10-CM

## 2018-02-25 DIAGNOSIS — E1169 Type 2 diabetes mellitus with other specified complication: Secondary | ICD-10-CM | POA: Diagnosis present

## 2018-02-25 DIAGNOSIS — I10 Essential (primary) hypertension: Secondary | ICD-10-CM

## 2018-02-25 DIAGNOSIS — R7989 Other specified abnormal findings of blood chemistry: Secondary | ICD-10-CM

## 2018-02-25 LAB — COMPREHENSIVE METABOLIC PANEL
ALT: 27 U/L (ref 0–44)
AST: 31 U/L (ref 15–41)
Albumin: 3.4 g/dL — ABNORMAL LOW (ref 3.5–5.0)
Alkaline Phosphatase: 176 U/L — ABNORMAL HIGH (ref 38–126)
Anion gap: 17 — ABNORMAL HIGH (ref 5–15)
BUN: 53 mg/dL — ABNORMAL HIGH (ref 6–20)
CO2: 21 mmol/L — ABNORMAL LOW (ref 22–32)
Calcium: 9.1 mg/dL (ref 8.9–10.3)
Chloride: 89 mmol/L — ABNORMAL LOW (ref 98–111)
Creatinine, Ser: 4.86 mg/dL — ABNORMAL HIGH (ref 0.61–1.24)
GFR calc non Af Amer: 13 mL/min — ABNORMAL LOW (ref >60)
GFR, EST AFRICAN AMERICAN: 15 mL/min — AB (ref >60)
Glucose, Bld: 994 mg/dL (ref 70–99)
Potassium: 5.7 mmol/L — ABNORMAL HIGH (ref 3.5–5.1)
Sodium: 127 mmol/L — ABNORMAL LOW (ref 135–145)
Total Bilirubin: 2.1 mg/dL — ABNORMAL HIGH (ref 0.3–1.2)
Total Protein: 9.1 g/dL — ABNORMAL HIGH (ref 6.5–8.1)

## 2018-02-25 LAB — BASIC METABOLIC PANEL
Anion gap: 17 — ABNORMAL HIGH (ref 5–15)
Anion gap: 16 — ABNORMAL HIGH (ref 5–15)
BUN: 54 mg/dL — ABNORMAL HIGH (ref 6–20)
BUN: 55 mg/dL — ABNORMAL HIGH (ref 6–20)
CO2: 16 mmol/L — ABNORMAL LOW (ref 22–32)
CO2: 19 mmol/L — ABNORMAL LOW (ref 22–32)
CREATININE: 4.8 mg/dL — AB (ref 0.61–1.24)
Calcium: 8.3 mg/dL — ABNORMAL LOW (ref 8.9–10.3)
Calcium: 8.5 mg/dL — ABNORMAL LOW (ref 8.9–10.3)
Chloride: 91 mmol/L — ABNORMAL LOW (ref 98–111)
Chloride: 94 mmol/L — ABNORMAL LOW (ref 98–111)
Creatinine, Ser: 4.65 mg/dL — ABNORMAL HIGH (ref 0.61–1.24)
GFR calc Af Amer: 16 mL/min — ABNORMAL LOW (ref >60)
GFR calc Af Amer: 16 mL/min — ABNORMAL LOW (ref >60)
GFR calc non Af Amer: 14 mL/min — ABNORMAL LOW (ref >60)
GFR, EST NON AFRICAN AMERICAN: 13 mL/min — AB (ref >60)
Glucose, Bld: 904 mg/dL (ref 70–99)
Glucose, Bld: 780 mg/dL (ref 70–99)
Potassium: 6 mmol/L — ABNORMAL HIGH (ref 3.5–5.1)
Potassium: 5.4 mmol/L — ABNORMAL HIGH (ref 3.5–5.1)
Sodium: 124 mmol/L — ABNORMAL LOW (ref 135–145)
Sodium: 129 mmol/L — ABNORMAL LOW (ref 135–145)

## 2018-02-25 LAB — URINALYSIS, ROUTINE W REFLEX MICROSCOPIC
Bacteria, UA: NONE SEEN
Bilirubin Urine: NEGATIVE
Ketones, ur: 5 mg/dL — AB
Leukocytes, UA: NEGATIVE
Nitrite: NEGATIVE
Protein, ur: 30 mg/dL — AB
Specific Gravity, Urine: 1.025 (ref 1.005–1.030)
pH: 5 (ref 5.0–8.0)

## 2018-02-25 LAB — I-STAT TROPONIN, ED
Troponin i, poc: 0.18 ng/mL (ref 0.00–0.08)
Troponin i, poc: 0.12 ng/mL (ref 0.00–0.08)

## 2018-02-25 LAB — CBC WITH DIFFERENTIAL/PLATELET
Abs Immature Granulocytes: 0.19 10*3/uL — ABNORMAL HIGH (ref 0.00–0.07)
Basophils Absolute: 0 10*3/uL (ref 0.0–0.1)
Basophils Relative: 0 %
EOS PCT: 0 %
Eosinophils Absolute: 0 10*3/uL (ref 0.0–0.5)
HCT: 49.9 % (ref 39.0–52.0)
Hemoglobin: 15.2 g/dL (ref 13.0–17.0)
Immature Granulocytes: 1 %
Lymphocytes Relative: 4 %
Lymphs Abs: 0.6 10*3/uL — ABNORMAL LOW (ref 0.7–4.0)
MCH: 29.4 pg (ref 26.0–34.0)
MCHC: 30.5 g/dL (ref 30.0–36.0)
MCV: 96.5 fL (ref 80.0–100.0)
Monocytes Absolute: 1.2 10*3/uL — ABNORMAL HIGH (ref 0.1–1.0)
Monocytes Relative: 7 %
Neutro Abs: 14.8 10*3/uL — ABNORMAL HIGH (ref 1.7–7.7)
Neutrophils Relative %: 88 %
PLATELETS: 204 10*3/uL (ref 150–400)
RBC: 5.17 MIL/uL (ref 4.22–5.81)
RDW: 13.2 % (ref 11.5–15.5)
WBC: 16.9 10*3/uL — ABNORMAL HIGH (ref 4.0–10.5)
nRBC: 0 % (ref 0.0–0.2)

## 2018-02-25 LAB — CBG MONITORING, ED
Glucose-Capillary: >600 mg/dL (ref 70–99)
Glucose-Capillary: >600 mg/dL (ref 70–99)
Glucose-Capillary: >600 mg/dL (ref 70–99)
Glucose-Capillary: >600 mg/dL (ref 70–99)
Glucose-Capillary: >600 mg/dL (ref 70–99)

## 2018-02-25 LAB — I-STAT VENOUS BLOOD GAS, ED
Acid-base deficit: 3 mmol/L — ABNORMAL HIGH (ref 0.0–2.0)
Bicarbonate: 23.3 mmol/L (ref 20.0–28.0)
O2 Saturation: 69 %
PCO2 VEN: 44.5 mmHg (ref 44.0–60.0)
TCO2: 25 mmol/L (ref 22–32)
pH, Ven: 7.326 (ref 7.250–7.430)
pO2, Ven: 39 mmHg (ref 32.0–45.0)

## 2018-02-25 LAB — BRAIN NATRIURETIC PEPTIDE: B Natriuretic Peptide: 207.2 pg/mL — ABNORMAL HIGH (ref 0.0–100.0)

## 2018-02-25 LAB — TROPONIN I
TROPONIN I: 0.1 ng/mL — AB (ref <0.03)
TROPONIN I: 0.14 ng/mL — AB (ref <0.03)

## 2018-02-25 LAB — GLUCOSE, CAPILLARY
GLUCOSE-CAPILLARY: 588 mg/dL — AB (ref 70–99)
Glucose-Capillary: 529 mg/dL (ref 70–99)

## 2018-02-25 LAB — PROTIME-INR
INR: 2.35
Prothrombin Time: 25.4 seconds — ABNORMAL HIGH (ref 11.4–15.2)

## 2018-02-25 LAB — I-STAT CG4 LACTIC ACID, ED
Lactic Acid, Venous: 2.28 mmol/L (ref 0.5–1.9)
Lactic Acid, Venous: 0.87 mmol/L (ref 0.5–1.9)

## 2018-02-25 LAB — INFLUENZA PANEL BY PCR (TYPE A & B)
Influenza A By PCR: NEGATIVE
Influenza B By PCR: NEGATIVE

## 2018-02-25 LAB — CK: Total CK: 818 U/L — ABNORMAL HIGH (ref 49–397)

## 2018-02-25 LAB — APTT: aPTT: 42 seconds — ABNORMAL HIGH (ref 24–36)

## 2018-02-25 MED ORDER — HEPARIN SODIUM (PORCINE) 5000 UNIT/ML IJ SOLN
5000.0000 [IU] | Freq: Three times a day (TID) | INTRAMUSCULAR | Status: DC
  Administered 2018-02-25 – 2018-02-26 (×2): 5000 [IU] via SUBCUTANEOUS
  Filled 2018-02-25 (×2): qty 1

## 2018-02-25 MED ORDER — AMLODIPINE BESYLATE 5 MG PO TABS
5.0000 mg | ORAL_TABLET | Freq: Every day | ORAL | Status: DC
  Administered 2018-02-25 – 2018-02-28 (×4): 5 mg via ORAL
  Filled 2018-02-25 (×4): qty 1

## 2018-02-25 MED ORDER — SODIUM CHLORIDE 0.9 % IV SOLN
2.0000 g | Freq: Once | INTRAVENOUS | Status: AC
  Administered 2018-02-25: 2 g via INTRAVENOUS
  Filled 2018-02-25: qty 2

## 2018-02-25 MED ORDER — ALBUTEROL SULFATE (2.5 MG/3ML) 0.083% IN NEBU
2.5000 mg | INHALATION_SOLUTION | Freq: Three times a day (TID) | RESPIRATORY_TRACT | Status: DC
  Administered 2018-02-26 – 2018-02-28 (×7): 2.5 mg via RESPIRATORY_TRACT
  Filled 2018-02-25 (×7): qty 3

## 2018-02-25 MED ORDER — IPRATROPIUM-ALBUTEROL 0.5-2.5 (3) MG/3ML IN SOLN
3.0000 mL | Freq: Once | RESPIRATORY_TRACT | Status: AC
  Administered 2018-02-25: 3 mL via RESPIRATORY_TRACT
  Filled 2018-02-25: qty 3

## 2018-02-25 MED ORDER — INFLUENZA VAC SPLIT QUAD 0.5 ML IM SUSY
0.5000 mL | PREFILLED_SYRINGE | INTRAMUSCULAR | Status: AC
  Administered 2018-02-28: 0.5 mL via INTRAMUSCULAR
  Filled 2018-02-25: qty 0.5

## 2018-02-25 MED ORDER — DEXTROSE-NACL 5-0.45 % IV SOLN
INTRAVENOUS | Status: DC
  Administered 2018-02-26: 02:00:00 via INTRAVENOUS

## 2018-02-25 MED ORDER — HEPARIN SODIUM (PORCINE) 5000 UNIT/ML IJ SOLN: 5000.0000 [IU] | Freq: Three times a day (TID) | INTRAMUSCULAR | Status: DC

## 2018-02-25 MED ORDER — CARVEDILOL 12.5 MG PO TABS
25.0000 mg | ORAL_TABLET | Freq: Two times a day (BID) | ORAL | Status: DC
  Administered 2018-02-26 – 2018-02-28 (×5): 25 mg via ORAL
  Filled 2018-02-25 (×5): qty 2

## 2018-02-25 MED ORDER — WARFARIN - PHARMACIST DOSING INPATIENT
Freq: Every day | Status: DC
  Administered 2018-02-25: 23:00:00

## 2018-02-25 MED ORDER — SODIUM CHLORIDE 0.9 % IV SOLN
INTRAVENOUS | Status: DC
  Administered 2018-02-25: 15:00:00 via INTRAVENOUS

## 2018-02-25 MED ORDER — ACETAMINOPHEN 325 MG PO TABS: 650.0000 mg | ORAL_TABLET | Freq: Four times a day (QID) | ORAL | Status: DC | PRN

## 2018-02-25 MED ORDER — HYDRALAZINE HCL 50 MG PO TABS
100.0000 mg | ORAL_TABLET | Freq: Three times a day (TID) | ORAL | Status: DC
  Administered 2018-02-25 – 2018-02-28 (×8): 100 mg via ORAL
  Filled 2018-02-25 (×8): qty 2

## 2018-02-25 MED ORDER — SODIUM CHLORIDE 0.9 % IV SOLN: INTRAVENOUS | Status: DC

## 2018-02-25 MED ORDER — LACTATED RINGERS IV BOLUS (SEPSIS)
1000.0000 mL | Freq: Once | INTRAVENOUS | Status: AC
  Administered 2018-02-25: 1000 mL via INTRAVENOUS

## 2018-02-25 MED ORDER — ACETAMINOPHEN 325 MG PO TABS
650.0000 mg | ORAL_TABLET | Freq: Once | ORAL | Status: AC
  Administered 2018-02-25: 650 mg via ORAL
  Filled 2018-02-25: qty 2

## 2018-02-25 MED ORDER — SODIUM CHLORIDE 0.9 % IV SOLN
INTRAVENOUS | Status: AC
  Administered 2018-02-25: 17:00:00 via INTRAVENOUS

## 2018-02-25 MED ORDER — ALLOPURINOL 300 MG PO TABS
300.0000 mg | ORAL_TABLET | Freq: Every day | ORAL | Status: DC
  Administered 2018-02-26 – 2018-02-28 (×3): 300 mg via ORAL
  Filled 2018-02-25 (×3): qty 1

## 2018-02-25 MED ORDER — ISOSORBIDE MONONITRATE ER 30 MG PO TB24
90.0000 mg | ORAL_TABLET | Freq: Every day | ORAL | Status: DC
  Administered 2018-02-25 – 2018-02-28 (×4): 90 mg via ORAL
  Filled 2018-02-25 (×4): qty 3

## 2018-02-25 MED ORDER — SODIUM CHLORIDE 0.9 % IV SOLN: 1.0000 g | INTRAVENOUS | Status: DC

## 2018-02-25 MED ORDER — ONDANSETRON HCL 4 MG PO TABS: 4.0000 mg | ORAL_TABLET | Freq: Four times a day (QID) | ORAL | Status: DC | PRN

## 2018-02-25 MED ORDER — LACTATED RINGERS IV BOLUS (SEPSIS)
400.0000 mL | Freq: Once | INTRAVENOUS | Status: AC
  Administered 2018-02-26: 400 mL via INTRAVENOUS

## 2018-02-25 MED ORDER — DEXTROSE-NACL 5-0.45 % IV SOLN: INTRAVENOUS | Status: DC

## 2018-02-25 MED ORDER — METRONIDAZOLE IN NACL 5-0.79 MG/ML-% IV SOLN
500.0000 mg | Freq: Three times a day (TID) | INTRAVENOUS | Status: DC
  Administered 2018-02-25 – 2018-02-26 (×3): 500 mg via INTRAVENOUS
  Filled 2018-02-25 (×3): qty 100

## 2018-02-25 MED ORDER — AMIODARONE HCL 200 MG PO TABS
200.0000 mg | ORAL_TABLET | Freq: Every day | ORAL | Status: DC
  Administered 2018-02-25 – 2018-02-28 (×4): 200 mg via ORAL
  Filled 2018-02-25 (×4): qty 1

## 2018-02-25 MED ORDER — FUROSEMIDE 10 MG/ML IJ SOLN
40.0000 mg | Freq: Once | INTRAMUSCULAR | Status: DC
  Filled 2018-02-25: qty 4

## 2018-02-25 MED ORDER — INSULIN REGULAR(HUMAN) IN NACL 100-0.9 UT/100ML-% IV SOLN
INTRAVENOUS | Status: DC
  Administered 2018-02-25: 5.4 [IU]/h via INTRAVENOUS
  Administered 2018-02-25: 31.7 [IU]/h via INTRAVENOUS
  Administered 2018-02-26: 15.1 [IU]/h via INTRAVENOUS
  Filled 2018-02-25 (×3): qty 100

## 2018-02-25 MED ORDER — ACETAMINOPHEN 650 MG RE SUPP: 650.0000 mg | Freq: Four times a day (QID) | RECTAL | Status: DC | PRN

## 2018-02-25 MED ORDER — CALCITRIOL 0.25 MCG PO CAPS
0.2500 ug | ORAL_CAPSULE | Freq: Every day | ORAL | Status: DC
  Administered 2018-02-25 – 2018-02-28 (×4): 0.25 ug via ORAL
  Filled 2018-02-25 (×4): qty 1

## 2018-02-25 MED ORDER — ONDANSETRON HCL 4 MG/2ML IJ SOLN
4.0000 mg | Freq: Four times a day (QID) | INTRAMUSCULAR | Status: DC | PRN
  Administered 2018-02-27: 4 mg via INTRAVENOUS
  Filled 2018-02-25: qty 2

## 2018-02-25 MED ORDER — ALBUTEROL SULFATE (2.5 MG/3ML) 0.083% IN NEBU
2.5000 mg | INHALATION_SOLUTION | Freq: Four times a day (QID) | RESPIRATORY_TRACT | Status: DC
  Administered 2018-02-25 (×2): 2.5 mg via RESPIRATORY_TRACT
  Filled 2018-02-25: qty 3

## 2018-02-25 MED ORDER — VANCOMYCIN HCL 10 G IV SOLR
2000.0000 mg | Freq: Once | INTRAVENOUS | Status: AC
  Administered 2018-02-25: 2000 mg via INTRAVENOUS
  Filled 2018-02-25: qty 2000

## 2018-02-25 MED ORDER — ASPIRIN 81 MG PO CHEW
324.0000 mg | CHEWABLE_TABLET | Freq: Once | ORAL | Status: AC
  Administered 2018-02-25: 324 mg via ORAL
  Filled 2018-02-25: qty 4

## 2018-02-25 MED ORDER — RANOLAZINE ER 500 MG PO TB12
500.0000 mg | ORAL_TABLET | Freq: Two times a day (BID) | ORAL | Status: DC
  Administered 2018-02-25 – 2018-02-28 (×6): 500 mg via ORAL
  Filled 2018-02-25 (×6): qty 1

## 2018-02-25 MED ORDER — WARFARIN SODIUM 5 MG PO TABS
5.0000 mg | ORAL_TABLET | Freq: Once | ORAL | Status: AC
  Administered 2018-02-25: 5 mg via ORAL
  Filled 2018-02-25 (×2): qty 1

## 2018-02-25 NOTE — H&P (Signed)
History and Physical    Travis Bryan FXT:[id number] DOB: [birth year] DOA: 02/25/2018  PCP: Carroll Sage, MD   Patient coming from: Home.  I have personally briefly reviewed patient's old medical records in [place]  Chief Complaint: Fever, chills and now.  HPI: Travis Bryan is a 47 y.o. male with medical history significant of AICD, atrial flutter, chronic systolic CHF with EF 25 to 30%, CAD, history of MI, AICD placement, persistent atrial fibrillation with chads 2 VASC score of 4, essential hypertension, gout, hyperlipidemia, obesity, type 2 diabetes who is coming to the emergency department with complaints of progressively worse fever with chills, nausea associated with chills, fatigue and malaise.  He has also been coughing with production of yellowish sputum.  He has been having polyuria, but has not been drinking fluids or eating much.  No heat or cold intolerance.  He denies chest pain, palpitations, diaphoresis, PND, orthopnea or pitting edema of the lower extremities.  No abdominal pain, emesis, diarrhea, constipation, melena or hematochezia.  He denies dysuria or hematuria.  ED Course: Initial vital signs in the emergency department temperature 101.1 F, pulse 72, respirations 22, blood pressure 120/69 mmHg and O2 sat 81% on room air.  He is O2 sat improved to 97% after he was given nonrebreather mask.  He was also given cefepime, vancomycin and was started on a continuous insulin infusion.  Urinalysis showed glucosuria more than 500, proteinuria with daily and ketones of 5 mg/dL.  There was moderate hemoglobinuria.  PT was 25.4 and PTT 42 seconds.  INR was 2.35. His CBC showed a white count was 16.9 with 88% neutrophils, 4% lymphocytes and 7% monocytes.  Hemoglobin was 15.2 g/dL and platelets 204. Venous blood gas was normal except for a 3.0 mmol/L acid-base deficit.  Lactic acid was 2.28, then was 0.87 mmol/L.  Initial troponin 0 0.18 and follow-up 0.12 ng/mL.  BNP was 207.2  pg/mL.    CMP shows a sodium 127, potassium 5.7, chloride 89 and CO2 21 mmol/L.  Anion gap was 17.  Bilirubin 2.1, BUN was 53, creatinine 4.86 (baseline around 2.5-2.8) and glucose 994 mg/dL.  Total protein was 9.1, albumin 3.4 g/dL.  AST and ALT were normal.  Alkaline phosphatase 176 units/L.  Imaging: Chest radiograph shows left lower lobe infiltrate.  Please see images and full radiology report for further detail.  Review of Systems: As per HPI otherwise 10 point review of systems negative.   Past Medical History:  Diagnosis Date  . AICD (automatic cardioverter/defibrillator) present   . Atrial flutter ([place])    a. s/p ablation 03/2016  . Chronic systolic CHF (congestive heart failure) ([place])   . Essential hypertension 12/26/2005   Qualifier: Diagnosis of  By: Jerilee Hoh MD, Olam Idler    . History of gout   . Hyperlipidemia   . Hypertension   . Myocardial infarction [facility])    "I think I had one a long long time ago" (03/28/2016)  . NICM (nonischemic cardiomyopathy) ([place])    a. LHC 6/06: pLAD 20, pLCx 20-30; b. Echo 5/15:  EF 15%, diffuse HK, restrictive physiology, trivial AI, trivial MR, mild LAE, moderate RVE, moderately reduced RVSF, moderate RAE, mild to moderate TR, PASP 43 mmHg  . Obesity   . Persistent atrial fibrillation   . Type II diabetes mellitus ([place])     Past Surgical History:  Procedure Laterality Date  . CARDIOVERSION N/A 04/07/2016   Procedure: CARDIOVERSION;  Surgeon: Thayer Headings, MD;  Location: [place];  Service: Cardiovascular;  Laterality: N/A;  . CARDIOVERSION N/A 04/11/2016   Procedure: CARDIOVERSION;  Surgeon: Larey Dresser, MD;  Location: [place];  Service: Cardiovascular;  Laterality: N/A;  . ELECTROPHYSIOLOGIC STUDY N/A 03/31/2016   Procedure: A-Flutter Ablation;  Surgeon: Evans Lance, MD;  Location: [place] CV LAB;  Service: Cardiovascular;  Laterality: N/A;  . IMPLANTABLE CARDIOVERTER DEFIBRILLATOR (ICD) GENERATOR CHANGE N/A 08/27/2013    Procedure: ICD GENERATOR CHANGE;  Surgeon: Evans Lance, MD;  Location: [facility] CATH LAB;  Service: Cardiovascular;  Laterality: N/A;  . TEE WITHOUT CARDIOVERSION N/A 03/31/2016   Procedure: TRANSESOPHAGEAL ECHOCARDIOGRAM (TEE);  Surgeon: Larey Dresser, MD;  Location[facility] ENDOSCOPY;  Service: Cardiovascular;  Laterality: N/A;     reports that he quit smoking about 15 years ago. His smoking use included cigars. He quit after 10.00 years of use. He has never used smokeless tobacco. He reports that he does not drink alcohol or use drugs.  Allergies  Allergen Reactions  . No Known Allergies     Family History  Problem Relation Age of Onset  . Hypertension Mother   . Heart disease Mother   . Diabetes Mother    Prior to Admission medications   Medication Sig Start Date End Date Taking? Authorizing Provider  allopurinol (ZYLOPRIM) 300 MG tablet Take 300 mg by mouth daily.    Yes [provider]  amiodarone (PACERONE) 200 MG tablet Take 1 tablet (200 mg total) by mouth daily. 12/26/17  Yes Carroll Sage, MD  amLODipine (NORVASC) 5 MG tablet Take 1 tablet (5 mg total) by mouth daily. 08/23/17  Yes Lenore Cordia, MD  atorvastatin (LIPITOR) 40 MG tablet Take 1 tablet (40 mg total) by mouth daily. 08/23/17  Yes Zada Finders R, MD  calcitRIOL (ROCALTROL) 0.25 MCG capsule Take 0.25 mcg by mouth daily.   Yes [provider]  carvedilol (COREG) 25 MG tablet TAKE 1 TABLET BY MOUTH TWICE A DAY WITH A MEAL. Patient taking differently: Take 25 mg by mouth 2 (two) times daily with a meal.  12/14/17  Yes Larey Dresser, MD  hydrALAZINE (APRESOLINE) 100 MG tablet TAKE 1 TABLET BY MOUTH 3 TIMES DAILY. 11/09/17  Yes Larey Dresser, MD  isosorbide mononitrate (IMDUR) 30 MG 24 hr tablet TAKE 3 TABLETS (90 MG TOTAL) BY MOUTH DAILY. 09/07/17 02/25/18 Yes Larey Dresser, MD  magnesium oxide (MAG-OX) 400 (241.3 Mg) MG tablet Take 1 tablet (400 mg total) by mouth daily. 04/15/16  Yes Shirley Friar, PA-C  metolazone (ZAROXOLYN) 2.5 MG tablet TAKE 1 TABLET BY MOUTH AS NEEDED AS DIRECTED Patient taking differently: Take 2.5 mg by mouth as needed (fluid).  12/15/16  Yes Bensimhon, Shaune Pascal, MD  potassium chloride SA (K-DUR,KLOR-CON) 20 MEQ tablet TAKE 2 TABLETS BY MOUTH IN THE MORNING AND 1 TABLET IN THE EVENING 01/16/18  Yes Larey Dresser, MD  ranolazine (RANEXA) 500 MG 12 hr tablet TAKE 1 TABLET (500 MG TOTAL) BY MOUTH 2 (TWO) TIMES DAILY. 12/19/17  Yes Larey Dresser, MD  torsemide (DEMADEX) 20 MG tablet Take 3 tablets (60 mg total) by mouth daily. 12/26/17  Yes Carroll Sage, MD  warfarin (COUMADIN) 5 MG tablet TAKE AS DIRECTED BY COUMADIN CLINIC Patient taking differently: Take 5 mg by mouth daily at 6 PM. Take As Directed by Coumadin Clinic 01/23/18  Yes Larey Dresser, MD  diclofenac sodium (VOLTAREN) 1 % GEL Apply 2 g topically 4 (four) times daily. Patient not  taking: Reported on 12/01/2016 10/25/16   Avie Echevaria B, PA-C  insulin glargine (LANTUS) 100 UNIT/ML injection Inject 0.1 mLs (10 Units total) into the skin at bedtime. Patient not taking: Reported on 02/25/2018 08/23/17   Lenore Cordia, MD  Insulin Syringe-Needle U-100 31G X 15/64" 0.3 ML MISC Use to inject insulin 4 times a day 07/27/16   Sid Falcon, MD    Physical Exam: Vitals:   02/25/18 1600 02/25/18 1630 02/25/18 1645 02/25/18 1700  BP:      Pulse: 72 70 69 69  Resp: (!) 21 (!) 21 (!) 23 20  Temp:      SpO2: 93% 94% 93% 91%  Weight:      Height:        Constitutional: Somnolent, mildly febrile but in NAD. Eyes: PERRL, lids and conjunctivae normal ENMT: Mucous membranes are severely dry.  Posterior pharynx clear of any exudate or lesions. Neck: normal, supple, no masses, no thyromegaly Respiratory: Mild rhonchi bilaterally, no wheezing, left lower lobe crackles. Normal respiratory effort. No accessory muscle use.  Cardiovascular: Regular rate and rhythm with occasional extrasystole, no  murmurs / rubs / gallops. No lower extremities pitting edema. 2+ pedal pulses. No carotid bruits.  Abdomen: Obese, soft, no tenderness, no masses palpated. No hepatosplenomegaly. Bowel sounds positive.  Musculoskeletal: no clubbing / cyanosis. Good ROM, no contractures. Normal muscle tone.  Skin: no rashes, lesions, ulcers on limited dermatological examination Neurologic: CN 2-12 grossly intact. Sensation intact, DTR normal. Strength 5/5 in all 4.  Psychiatric: Somnolent.  Alert and oriented x 4 with some difficulty.   Labs on Admission: I have personally reviewed following labs and imaging studies  CBC: Recent Labs  Lab 02/25/18 1235  WBC 16.9*  NEUTROABS 14.8*  HGB 15.2  HCT 49.9  MCV 96.5  PLT 007   Basic Metabolic Panel: Recent Labs  Lab 02/25/18 1235  NA 127*  K 5.7*  CL 89*  CO2 21*  GLUCOSE 994*  BUN 53*  CREATININE 4.86*  CALCIUM 9.1   GFR: Estimated Creatinine Clearance: 23.4 mL/min (A) (by C-G formula based on SCr of 4.86 mg/dL (H)). Liver Function Tests: Recent Labs  Lab 02/25/18 1235  AST 31  ALT 27  ALKPHOS 176*  BILITOT 2.1*  PROT 9.1*  ALBUMIN 3.4*   No results for input(s): LIPASE, AMYLASE in the last 168 hours. No results for input(s): AMMONIA in the last 168 hours. Coagulation Profile: Recent Labs  Lab 02/25/18 1625  INR 2.35   Cardiac Enzymes: No results for input(s): CKTOTAL, CKMB, CKMBINDEX, TROPONINI in the last 168 hours. BNP (last 3 results) No results for input(s): PROBNP in the last 8760 hours. HbA1C: No results for input(s): HGBA1C in the last 72 hours. CBG: Recent Labs  Lab 02/25/18 1537 02/25/18 1718  GLUCAP >600* >600*   Lipid Profile: No results for input(s): CHOL, HDL, LDLCALC, TRIG, CHOLHDL, LDLDIRECT in the last 72 hours. Thyroid Function Tests: No results for input(s): TSH, T4TOTAL, FREET4, T3FREE, THYROIDAB in the last 72 hours. Anemia Panel: No results for input(s): VITAMINB12, FOLATE, FERRITIN, TIBC, IRON,  RETICCTPCT in the last 72 hours. Urine analysis:    Component Value Date/Time   COLORURINE YELLOW 02/25/2018 1547   APPEARANCEUR CLEAR 02/25/2018 1547   LABSPEC 1.025 02/25/2018 1547   PHURINE 5.0 02/25/2018 1547   GLUCOSEU >=500 (A) 02/25/2018 1547   HGBUR MODERATE (A) 02/25/2018 1547   BILIRUBINUR NEGATIVE 02/25/2018 1547   KETONESUR 5 (A) 02/25/2018 1547   PROTEINUR 30 (  A) 02/25/2018 1547   UROBILINOGEN 0.2 12/18/2006 0428   NITRITE NEGATIVE 02/25/2018 1547   LEUKOCYTESUR NEGATIVE 02/25/2018 1547    Radiological Exams on Admission: Dg Chest Portable 1 View  Result Date: 02/25/2018 CLINICAL DATA:  Shortness of breath. EXAM: PORTABLE CHEST 1 VIEW COMPARISON:  04/01/2016 FINDINGS: Pacemaker/AICD in place. Chronic cardiomegaly. The right lung is clear. There is abnormal density in the left lower lung that could be due to pneumonia or a posterior left effusion. IMPRESSION: Question left lower lobe pneumonia versus left effusion. Electronically Signed   By: Nelson Chimes M.D.   On: 02/25/2018 13:01    EKG: Independently reviewed. Vent. rate 78 BPM PR interval * ms QRS duration 215 ms QT/QTc 435/496 ms P-R-T axes 42 -27 110 Sinus rhythm IVCD, consider atypical RBBB Abnormal T, consider ischemia, lateral leads  Assessment/Plan Principal Problem:   DKA (diabetic ketoacidosis) ([place])  Admit to the progressive unit/inpatient. Continue time-limited IV fluids. Continue insulin infusion. CBG monitoring every hour. Monitor and correct electrolytes.  Active Problems:   Sepsis due to pneumonia ([place]) Continue supplemental oxygen. Continue time-limited IV fluids. Cefepime per pharmacy. Vancomycin per pharmacy. Check sputum Gram stain, culture and sensitivity. Chest strep pneumoniae urinary antigen. Follow-up blood cultures sensitivity.    AKI (acute kidney injury) ([place]) Secondary to DKA, sepsis and diuretic use. Hold diuretics. Time limited IV fluids given history of systolic  CHF.    Hyperlipidemia In the presence of AKI, check total CK before ordering atorvastatin.    Hypertension associated with diabetes ([place]) Continue carvedilol 25 mg p.o. twice daily starting in a.m. Continue hydralazine 100 mg p.o. 3 times daily. Continue amlodipine 5 mg p.o. daily. Hold torsemide and metolazone for now.    Chronic systolic CHF (congestive heart failure) ([facility]) Continue Imdur, hydralazine, carvedilol and resume diuretics when appropriate.    Atrial fibrillation ([facility]) CHA?DS?-VASc Score of 4. Continue amiodarone 200 mg p.o. daily. Continue carvedilol 25 mg p.o. twice daily continue warfarin per pharmacy.    CAD (coronary artery disease) Continue carvedilol, Lafonda Mosses and warfarin. Resume atorvastatin if no signs of rhabdomyolysis.    Elevated troponin Likely demand ischemia.  This is chronically elevated. Trend troponin levels.    DVT prophylaxis: On warfarin. Code Status: Full code. Family Communication: None at bedside. Disposition Plan: Admit for DKA/sepsis/pneumonia treatment. Consults called:  Admission status: Inpatient/stepdown.   Reubin Milan MD [facility]   If 7PM-7AM, please contact night-coverage [email or web address] Password [facility]  02/25/2018, 5:31 PM   This document was prepared using Dragon voice recognition software and may contain some unintended transcription errors.

## 2018-02-25 NOTE — ED Provider Notes (Signed)
[facility] EMERGENCY DEPARTMENT Provider Note   CSN: [id number] Arrival date & time: 02/25/18  1145     History   Chief Complaint Chief Complaint  Patient presents with  . Fever  . Chills  . Nausea    HPI Travis Bryan is a 47 y.o. male.  HPI   Patient is a 47 year old male with a history of atrial flutter s/p ablation, chronic systolic CHF, AICD, hypertension, hyperlipidemia, MI, type 2 diabetes who presents emergency department today for evaluation of fevers, chills and nausea.  Patient states that yesterday he had some rhinorrhea congestion and sore throat but this is since resolved.  He states for the last several days he has had a mild rare cough.  Has had some sputum production.  Does not feel short of breath at this time.  Is denying chest pain.  States he has missed a few doses of his diuretics.  He denies any documented fevers at home.  He reports nausea, but denies any abdominal pain.  Denies any diarrhea or constipation.  Denies dysuria.  Not receive his flu shot this year.  Past Medical History:  Diagnosis Date  . AICD (automatic cardioverter/defibrillator) present   . Atrial flutter ([place])    a. s/p ablation 03/2016  . Chronic systolic CHF (congestive heart failure) ([place])   . Essential hypertension 12/26/2005   Qualifier: Diagnosis of  By: Jerilee Hoh MD, Olam Idler    . History of gout   . Hyperlipidemia   . Hypertension   . Myocardial infarction [facility])    "I think I had one a long long time ago" (03/28/2016)  . NICM (nonischemic cardiomyopathy) ([place])    a. LHC 6/06: pLAD 20, pLCx 20-30; b. Echo 5/15:  EF 15%, diffuse HK, restrictive physiology, trivial AI, trivial MR, mild LAE, moderate RVE, moderately reduced RVSF, moderate RAE, mild to moderate TR, PASP 43 mmHg  . Obesity   . Persistent atrial fibrillation   . Type II diabetes mellitus [facility])     Patient Active Problem List   Diagnosis Date Noted  . Type 2 diabetes mellitus with complication,  with long-term current use of insulin ([place]) 12/26/2017  . At risk for sexually transmitted disease due to unprotected sex 08/23/2017  . Healthcare maintenance 06/24/2016  . Persistent atrial fibrillation   . CARDIOMYOPATHY, SECONDARY 07/03/2008  . HFrEF (heart failure with reduced ejection fraction) ([place]) 07/03/2008  . Automatic implantable cardioverter-defibrillator in situ 07/03/2008  . Type 2 diabetes mellitus ([place]) 12/26/2005  . Hyperlipidemia associated with type 2 diabetes mellitus ([place]) 12/26/2005  . Hypertension associated with diabetes ([place]) 12/26/2005    Past Surgical History:  Procedure Laterality Date  . CARDIOVERSION N/A 04/07/2016   Procedure: CARDIOVERSION;  Surgeon: Thayer Headings, MD;  Location: [facility] ENDOSCOPY;  Service: Cardiovascular;  Laterality: N/A;  . CARDIOVERSION N/A 04/11/2016   Procedure: CARDIOVERSION;  Surgeon: Larey Dresser, MD;  Location: [place];  Service: Cardiovascular;  Laterality: N/A;  . ELECTROPHYSIOLOGIC STUDY N/A 03/31/2016   Procedure: A-Flutter Ablation;  Surgeon: Evans Lance, MD;  Location: [place] CV LAB;  Service: Cardiovascular;  Laterality: N/A;  . IMPLANTABLE CARDIOVERTER DEFIBRILLATOR (ICD) GENERATOR CHANGE N/A 08/27/2013   Procedure: ICD GENERATOR CHANGE;  Surgeon: Evans Lance, MD;  Location: [facility] CATH LAB;  Service: Cardiovascular;  Laterality: N/A;  . TEE WITHOUT CARDIOVERSION N/A 03/31/2016   Procedure: TRANSESOPHAGEAL ECHOCARDIOGRAM (TEE);  Surgeon: Larey Dresser, MD;  Location: [place];  Service: Cardiovascular;  Laterality: N/A;  Home Medications    Prior to Admission medications   Medication Sig Start Date End Date Taking? Authorizing Provider  allopurinol (ZYLOPRIM) 300 MG tablet Take 300 mg by mouth daily.    Yes [provider]  amiodarone (PACERONE) 200 MG tablet Take 1 tablet (200 mg total) by mouth daily. 12/26/17  Yes Carroll Sage, MD  amLODipine (NORVASC) 5 MG tablet Take 1 tablet (5  mg total) by mouth daily. 08/23/17  Yes Lenore Cordia, MD  atorvastatin (LIPITOR) 40 MG tablet Take 1 tablet (40 mg total) by mouth daily. 08/23/17  Yes Zada Finders R, MD  calcitRIOL (ROCALTROL) 0.25 MCG capsule Take 0.25 mcg by mouth daily.   Yes [provider]  carvedilol (COREG) 25 MG tablet TAKE 1 TABLET BY MOUTH TWICE A DAY WITH A MEAL. Patient taking differently: Take 25 mg by mouth 2 (two) times daily with a meal.  12/14/17  Yes Larey Dresser, MD  hydrALAZINE (APRESOLINE) 100 MG tablet TAKE 1 TABLET BY MOUTH 3 TIMES DAILY. 11/09/17  Yes Larey Dresser, MD  isosorbide mononitrate (IMDUR) 30 MG 24 hr tablet TAKE 3 TABLETS (90 MG TOTAL) BY MOUTH DAILY. 09/07/17 02/25/18 Yes Larey Dresser, MD  magnesium oxide (MAG-OX) 400 (241.3 Mg) MG tablet Take 1 tablet (400 mg total) by mouth daily. 04/15/16  Yes Shirley Friar, PA-C  metolazone (ZAROXOLYN) 2.5 MG tablet TAKE 1 TABLET BY MOUTH AS NEEDED AS DIRECTED Patient taking differently: Take 2.5 mg by mouth as needed (fluid).  12/15/16  Yes Bensimhon, Shaune Pascal, MD  potassium chloride SA (K-DUR,KLOR-CON) 20 MEQ tablet TAKE 2 TABLETS BY MOUTH IN THE MORNING AND 1 TABLET IN THE EVENING 01/16/18  Yes Larey Dresser, MD  ranolazine (RANEXA) 500 MG 12 hr tablet TAKE 1 TABLET (500 MG TOTAL) BY MOUTH 2 (TWO) TIMES DAILY. 12/19/17  Yes Larey Dresser, MD  torsemide (DEMADEX) 20 MG tablet Take 3 tablets (60 mg total) by mouth daily. 12/26/17  Yes Carroll Sage, MD  warfarin (COUMADIN) 5 MG tablet TAKE AS DIRECTED BY COUMADIN CLINIC Patient taking differently: Take 5 mg by mouth daily at 6 PM. Take As Directed by Coumadin Clinic 01/23/18  Yes Larey Dresser, MD  diclofenac sodium (VOLTAREN) 1 % GEL Apply 2 g topically 4 (four) times daily. Patient not taking: Reported on 12/01/2016 10/25/16   Avie Echevaria B, PA-C  insulin glargine (LANTUS) 100 UNIT/ML injection Inject 0.1 mLs (10 Units total) into the skin at bedtime. Patient not  taking: Reported on 02/25/2018 08/23/17   Lenore Cordia, MD  Insulin Syringe-Needle U-100 31G X 15/64" 0.3 ML MISC Use to inject insulin 4 times a day 07/27/16   Sid Falcon, MD    Family History Family History  Problem Relation Age of Onset  . Hypertension Mother   . Heart disease Mother   . Diabetes Mother     Social History Social History   Tobacco Use  . Smoking status: Former Smoker    Years: 10.00    Types: Cigars    Last attempt to quit: 07/28/2002    Years since quitting: 15.5  . Smokeless tobacco: Never Used  Substance Use Topics  . Alcohol use: No    Comment: 03/28/2016 "stopped drinking 4-5 months ago"  . Drug use: No     Allergies   No known allergies   Review of Systems Review of Systems Constitutional: Negative for chills and fever.  HENT: Positive for congestion, rhinorrhea  and sore throat. Negative for ear pain.   Eyes: Negative for pain and visual disturbance.  Respiratory: Positive for cough. Negative for shortness of breath and wheezing.   Cardiovascular: Negative for chest pain and leg swelling.  Gastrointestinal: Positive for nausea. Negative for abdominal pain, blood in stool, constipation, diarrhea and vomiting.  Genitourinary: Positive for frequency. Negative for dysuria, hematuria and urgency.  Musculoskeletal: Negative for back pain.  Skin: Negative for color change and rash.  Neurological: Negative for headaches.  All other systems reviewed and are negative.   Physical Exam Updated Vital Signs BP (!) 134/92   Pulse 72   Temp (!) 101.1 F (38.4 C)   Resp (!) 21   Ht '5\' 10"'  (1.778 m)   Wt 111.1 kg   SpO2 93%   BMI 35.15 kg/m   Physical Exam Vitals signs and nursing note reviewed.  Constitutional:      Appearance: He is well-developed. He is obese. He is ill-appearing.  HENT:     Head: Normocephalic and atraumatic.  Eyes:     Conjunctiva/sclera: Conjunctivae normal.     Pupils: Pupils are equal, round, and reactive to  light.  Neck:     Musculoskeletal: Neck supple.  Cardiovascular:     Rate and Rhythm: Normal rate and regular rhythm.     Pulses: Normal pulses.     Heart sounds: No murmur.  Pulmonary:     Effort: Pulmonary effort is normal.     Comments: On nonrebreather, satting at 96%. Coarse lung sounds throughout. Diminished breath sounds throughout. Abdominal:     General: Bowel sounds are normal.     Palpations: Abdomen is soft.     Tenderness: There is no abdominal tenderness. There is no guarding.  Skin:    General: Skin is warm and dry.     Capillary Refill: Capillary refill takes less than 2 seconds.  Neurological:     Mental Status: He is alert and oriented to person, place, and time.  Psychiatric:        Mood and Affect: Mood normal.     ED Treatments / Results  Labs (all labs ordered are listed, but only abnormal results are displayed) Labs Reviewed  COMPREHENSIVE METABOLIC PANEL - Abnormal; Notable for the following components:      Result Value   Sodium 127 (*)    Potassium 5.7 (*)    Chloride 89 (*)    CO2 21 (*)    Glucose, Bld 994 (*)    BUN 53 (*)    Creatinine, Ser 4.86 (*)    Total Protein 9.1 (*)    Albumin 3.4 (*)    Alkaline Phosphatase 176 (*)    Total Bilirubin 2.1 (*)    GFR calc non Af Amer 13 (*)    GFR calc Af Amer 15 (*)    Anion gap 17 (*)    All other components within normal limits  CBC WITH DIFFERENTIAL/PLATELET - Abnormal; Notable for the following components:   WBC 16.9 (*)    Neutro Abs 14.8 (*)    Lymphs Abs 0.6 (*)    Monocytes Absolute 1.2 (*)    Abs Immature Granulocytes 0.19 (*)    All other components within normal limits  BRAIN NATRIURETIC PEPTIDE - Abnormal; Notable for the following components:   B Natriuretic Peptide 207.2 (*)    All other components within normal limits  I-STAT CG4 LACTIC ACID, ED - Abnormal; Notable for the following components:   Lactic Acid, Venous  2.28 (*)    All other components within normal limits    I-STAT VENOUS BLOOD GAS, ED - Abnormal; Notable for the following components:   Acid-base deficit 3.0 (*)    All other components within normal limits  I-STAT TROPONIN, ED - Abnormal; Notable for the following components:   Troponin i, poc 0.18 (*)    All other components within normal limits  CBG MONITORING, ED - Abnormal; Notable for the following components:   Glucose-Capillary >600 (*)    All other components within normal limits  CULTURE, BLOOD (ROUTINE X 2)  CULTURE, BLOOD (ROUTINE X 2)  URINALYSIS, ROUTINE W REFLEX MICROSCOPIC  INFLUENZA PANEL BY PCR (TYPE A & B)  LACTIC ACID, PLASMA  TROPONIN I  TROPONIN I  TROPONIN I  BASIC METABOLIC PANEL  I-STAT CG4 LACTIC ACID, ED  I-STAT TROPONIN, ED    EKG EKG Interpretation  Date/Time:  Monday February 25 2018 12:31:15 EST Ventricular Rate:  78 PR Interval:    QRS Duration: 215 QT Interval:  435 QTC Calculation: 496 R Axis:   -27 Text Interpretation:  Sinus rhythm IVCD, consider atypical RBBB Abnormal T, consider ischemia, lateral leads Confirmed by Veryl Speak [phone]) on 02/25/2018 12:56:14 PM   Radiology Dg Chest Portable 1 View  Result Date: 02/25/2018 CLINICAL DATA:  Shortness of breath. EXAM: PORTABLE CHEST 1 VIEW COMPARISON:  04/01/2016 FINDINGS: Pacemaker/AICD in place. Chronic cardiomegaly. The right lung is clear. There is abnormal density in the left lower lung that could be due to pneumonia or a posterior left effusion. IMPRESSION: Question left lower lobe pneumonia versus left effusion. Electronically Signed   By: Nelson Chimes M.D.   On: 02/25/2018 13:01    Procedures Procedures (including critical care time) CRITICAL CARE Performed by: Rodney Booze   Total critical care time: 45 minutes  Critical care time was exclusive of separately billable procedures and treating other patients.  Critical care was necessary to treat or prevent imminent or life-threatening deterioration.  Critical care was  time spent personally by me on the following activities: development of treatment plan with patient and/or surrogate as well as nursing, discussions with consultants, evaluation of patient's response to treatment, examination of patient, obtaining history from patient or surrogate, ordering and performing treatments and interventions, ordering and review of laboratory studies, ordering and review of radiographic studies, pulse oximetry and re-evaluation of patient's condition.   Medications Ordered in ED Medications  metroNIDAZOLE (FLAGYL) IVPB 500 mg (0 mg Intravenous Stopped 02/25/18 1427)  ceFEPIme (MAXIPIME) 2 g in sodium chloride 0.9 % 100 mL IVPB (has no administration in time range)  vancomycin (VANCOCIN) 2,000 mg in sodium chloride 0.9 % 500 mL IVPB (2,000 mg Intravenous New Bag/Given 02/25/18 1512)  insulin regular, human (MYXREDLIN) 100 units/ 100 mL infusion (5.4 Units/hr Intravenous New Bag/Given 02/25/18 1559)  0.9 %  sodium chloride infusion ( Intravenous New Bag/Given 02/25/18 1514)  dextrose 5 %-0.45 % sodium chloride infusion ( Intravenous Hold 02/25/18 1527)  acetaminophen (TYLENOL) tablet 650 mg (650 mg Oral Given 02/25/18 1308)  ipratropium-albuterol (DUONEB) 0.5-2.5 (3) MG/3ML nebulizer solution 3 mL (3 mLs Nebulization Given 02/25/18 1323)  aspirin chewable tablet 324 mg (324 mg Oral Given 02/25/18 1323)     Initial Impression / Assessment and Plan / ED Course  I have reviewed the triage vital signs and the nursing notes.  Pertinent labs & imaging results that were available during my care of the patient were reviewed by me and considered in my medical  decision making (see chart for details).   pt seen in conjunction with dr Stark Jock who evaluated pt and is in agreement with w/u and plan  Final Clinical Impressions(s) / ED Diagnoses   Final diagnoses:  Pneumonia of left lower lobe due to infectious organism ([place])  Hypoxia  Elevated troponin  Acute kidney injury  superimposed on chronic kidney disease ([place])  Diabetic ketoacidosis without coma associated with other specified diabetes mellitus ([place])   Presenting with complaints of URI symptoms found to be profoundly hypoxic in the ED and placed on nonrebreather.  He is also found to be febrile to 101.8 Fahrenheit.  Blood pressure and pulse are within normal limits.  Code sepsis initiated with concern for upper respiratory infection however broad-spectrum antibiotics were ordered to cover potential for other source. Pt with h/o cardiomyopathy and lactic acid of less than 4, will provide gentle hydration to avoid pulmonary edema.  CBC with leukocytosis at 16.9, no anemia CMP significant for Na 127, K 5.7, CO2 21, Glucose 994, BUN/Cr 53/4.86, Alk phos elevated 176, elevated total bili at 2.1, elevated anion gap at 17  -- Glucose stabilizer initiated Lactic acid elevated  Troponin elevated at 0.18  -- ASA given.  BNP 200 UA pending  EKG with Sinus rhythm IVCD, consider atypical RBBB Abnormal T, consider ischemia, lateral leads.  CXR with LLL pneumonia with possible effusion.   Will plan for admission for further tx of hypoxia and suspected sepsis secondary to LLL pneumonia, AKI on CKD, elevated troponin, and DKA.   2:27 PM CONSULT with critical care who states taht patieht is likely approp;raite for stepdown bed with triad. They will consult if needed.   Case dicussed with Dr. Olevia Bowens with triad who accepts pt for admission.  ED Discharge Orders    None       Rodney Booze, [place] 02/25/18 1608    Veryl Speak, MD 02/26/18 [phone]

## 2018-02-25 NOTE — ED Notes (Signed)
Pt continues to appear somnolent. Pt remains slightly altered. Answers questions appropriately, however actions are out of character.

## 2018-02-25 NOTE — ED Notes (Signed)
Informed Crystal - RN of pt's CBG result (result read "HI").

## 2018-02-25 NOTE — ED Notes (Signed)
Pt continues to remove NRB, oxygen saturations continue to fluctuate.

## 2018-02-25 NOTE — ED Notes (Signed)
Verified with MD Bodenheimer before increasing insulin. MD agreeable to increasing per Glucose Stabilizer.

## 2018-02-25 NOTE — ED Notes (Signed)
[name] CBG result reads "HI". Informed Crystal - RN.

## 2018-02-25 NOTE — Progress Notes (Signed)
Pharmacy Antibiotic Note  Travis Bryan is a 47 y.o. male admitted on 02/25/2018 with fever, chills, nausea and concern for sepsis/PNA. Pharmacy has been consulted for Vancomycin + Cefepime dosing.   Vancomycin 2g IV x 1 and Cefepime 2g IV x 1 ordered to be given in the ED along with Flagyl per MD.   The patient is noted to have AoCKD - SCr 4.86, CrCl<20 ml/min. Baseline appears to be 2.5-3.5   Plan: - No standing Vancomycin for now with AKI - monitor SCr trends and will consider a Vancomycin random level in 48 hours - Start Cefepime 1g IV every 24 hours - next dose due 12/17 - Will continue to follow renal function, culture results, LOT, and antibiotic de-escalation plans   Height: 5\' 10"  (177.8 cm) Weight: 245 lb (111.1 kg) IBW/kg (Calculated) : 73  Temp (24hrs), Avg:101.1 F (38.4 C), Min:101.1 F (38.4 C), Max:101.1 F (38.4 C)  Recent Labs  Lab 02/25/18 1235 02/25/18 1239 02/25/18 1601  WBC 16.9*  --   --   CREATININE 4.86*  --   --   LATICACIDVEN  --  2.28* 0.87    Estimated Creatinine Clearance: 23.4 mL/min (A) (by C-G formula based on SCr of 4.86 mg/dL (H)).    Allergies  Allergen Reactions  . No Known Allergies     Antimicrobials this admission: Vanc 12/16 >> Cefepime 12/16 >>  Dose adjustments this admission:  Microbiology results: 12/16 BCx >>  Thank you for allowing pharmacy to be a part of this patient's care.  Alycia Rossetti, PharmD, BCPS Clinical Pharmacist Pager: [phone] Clinical phone for 02/25/2018: [phone] Please check AMION for all [place] numbers 02/25/2018 4:50 PM

## 2018-02-25 NOTE — Progress Notes (Signed)
ANTICOAGULATION CONSULT NOTE - Initial Consult  Pharmacy Consult for Warfarin Indication: atrial fibrillation  Allergies  Allergen Reactions  . No Known Allergies     Patient Measurements: Height: 5\' 10"  (177.8 cm) Weight: 245 lb (111.1 kg) IBW/kg (Calculated) : 73  Vital Signs: Temp: 101.1 F (38.4 C) (12/16 1208) BP: 134/92 (12/16 1530) Pulse Rate: 69 (12/16 1645)  Labs: Recent Labs    02/25/18 1235  HGB 15.2  HCT 49.9  PLT 204  CREATININE 4.86*    Estimated Creatinine Clearance: 23.4 mL/min (A) (by C-G formula based on SCr of 4.86 mg/dL (H)).   Medical History: Past Medical History:  Diagnosis Date  . AICD (automatic cardioverter/defibrillator) present   . Atrial flutter ([place])    a. s/p ablation 03/2016  . Chronic systolic CHF (congestive heart failure) ([place])   . Essential hypertension 12/26/2005   Qualifier: Diagnosis of  By: Jerilee Hoh MD, Olam Idler    . History of gout   . Hyperlipidemia   . Hypertension   . Myocardial infarction [facility])    "I think I had one a long long time ago" (03/28/2016)  . NICM (nonischemic cardiomyopathy) ([place])    a. LHC 6/06: pLAD 20, pLCx 20-30; b. Echo 5/15:  EF 15%, diffuse HK, restrictive physiology, trivial AI, trivial MR, mild LAE, moderate RVE, moderately reduced RVSF, moderate RAE, mild to moderate TR, PASP 43 mmHg  . Obesity   . Persistent atrial fibrillation   . Type II diabetes mellitus ([facility])     Assessment: 40 YOM on warfarin PTA for hx Afib. Admit INR 2.35 on PTA dose. Pharmacy consulted to resume dosing this admission.   The patient is noted to be started on Flagyl which will increase the warfarin sensitivity.   Goal of Therapy:  INR 2-3 Monitor platelets by anticoagulation protocol: Yes   Plan:  - Warfarin 5 mg x 1 dose at 1800 today - Will continue to monitor for any signs/symptoms of bleeding and will follow up with PT/INR in the a.m.   Thank you for allowing pharmacy to be a part of this patient's  care.  Alycia Rossetti, PharmD, BCPS Clinical Pharmacist Please check AMION for all [place] numbers 02/25/2018 4:53 PM

## 2018-02-25 NOTE — Progress Notes (Signed)
Triage help to ER based on hall for CAP   Recent Labs  Lab 02/25/18 1239  LATICACIDVEN 2.28*   Recent Labs  Lab 02/25/18 1235  NA 127*  K 5.7*  CL 89*  CO2 21*  GLUCOSE 994*  BUN 53*  CREATININE 4.86*  CALCIUM 9.1   Recent Labs  Lab 02/25/18 1239  LATICACIDVEN 2.28*     ICU CAP Admission Criteria baed on talking to [place] - [place] in ER  Minor Criteria   RR >/= 30 or need for mechanical ventilation 0  PF ratio </= 250 6L - 94%  Multilobar infiltrates 0  Confusion/Disorientation/Acute delirium - hypo/hyper active 0  WC </= 4K 0  Platelet count </= 100K 0  Hypothermia <36C 0  Hypotension (even needing lot of fluids) 0  Total Score for Minor Criteria   MAJOR CRITERIA   Septic shock - need for vasopressors 0  Mechanical Ventilation (even non-invasive) 0  Total score for Major Criteria    Admit ICU if 3 minor or 1 major (Level 2 ATS rec) 1 minor criteria only      Based on phone call with above ceriteira - should be ok for sDU uinder triad. IF patient gets worse, call CCM     SIGNATURE    Dr. Brand Males, M.D., F.C.C.P,  Pulmonary and Critical Care Medicine Staff Physician, [place] Director - Interstitial Lung Disease  Program  Pulmonary [place] [facility] [place], [place], [address]  Pager: [phone], If no answer or between  15:00h - 7:00h: call [phone] Telephone: [phone]  2:24 PM 02/25/2018 '

## 2018-02-25 NOTE — ED Notes (Signed)
Pt placed on 6L [place] with saturations remaining around 93%. Will cont to monitor.

## 2018-02-26 ENCOUNTER — Inpatient Hospital Stay (HOSPITAL_COMMUNITY): Payer: Medicaid Other

## 2018-02-26 DIAGNOSIS — N179 Acute kidney failure, unspecified: Secondary | ICD-10-CM

## 2018-02-26 DIAGNOSIS — I2510 Atherosclerotic heart disease of native coronary artery without angina pectoris: Secondary | ICD-10-CM

## 2018-02-26 DIAGNOSIS — E1310 Other specified diabetes mellitus with ketoacidosis without coma: Secondary | ICD-10-CM

## 2018-02-26 DIAGNOSIS — N189 Chronic kidney disease, unspecified: Secondary | ICD-10-CM

## 2018-02-26 DIAGNOSIS — I4821 Permanent atrial fibrillation: Secondary | ICD-10-CM

## 2018-02-26 LAB — PROTIME-INR
INR: 2.92
PROTHROMBIN TIME: 30.1 s — AB (ref 11.4–15.2)

## 2018-02-26 LAB — CBC WITH DIFFERENTIAL/PLATELET
Abs Immature Granulocytes: 0.23 10*3/uL — ABNORMAL HIGH (ref 0.00–0.07)
Basophils Absolute: 0.1 10*3/uL (ref 0.0–0.1)
Basophils Relative: 0 %
EOS PCT: 0 %
Eosinophils Absolute: 0.1 10*3/uL (ref 0.0–0.5)
HCT: 42.2 % (ref 39.0–52.0)
Hemoglobin: 13.9 g/dL (ref 13.0–17.0)
Immature Granulocytes: 1 %
Lymphocytes Relative: 6 %
Lymphs Abs: 1.2 10*3/uL (ref 0.7–4.0)
MCH: 29.4 pg (ref 26.0–34.0)
MCHC: 32.9 g/dL (ref 30.0–36.0)
MCV: 89.2 fL (ref 80.0–100.0)
Monocytes Absolute: 1.6 10*3/uL — ABNORMAL HIGH (ref 0.1–1.0)
Monocytes Relative: 8 %
Neutro Abs: 17.4 10*3/uL — ABNORMAL HIGH (ref 1.7–7.7)
Neutrophils Relative %: 85 %
Platelets: 195 10*3/uL (ref 150–400)
RBC: 4.73 MIL/uL (ref 4.22–5.81)
RDW: 13 % (ref 11.5–15.5)
WBC: 20.6 10*3/uL — ABNORMAL HIGH (ref 4.0–10.5)
nRBC: 0 % (ref 0.0–0.2)

## 2018-02-26 LAB — COMPREHENSIVE METABOLIC PANEL
ALK PHOS: 113 U/L (ref 38–126)
ALT: 21 U/L (ref 0–44)
AST: 29 U/L (ref 15–41)
Albumin: 2.7 g/dL — ABNORMAL LOW (ref 3.5–5.0)
Anion gap: 12 (ref 5–15)
BUN: 47 mg/dL — ABNORMAL HIGH (ref 6–20)
CALCIUM: 8.6 mg/dL — AB (ref 8.9–10.3)
CO2: 22 mmol/L (ref 22–32)
Chloride: 102 mmol/L (ref 98–111)
Creatinine, Ser: 3.67 mg/dL — ABNORMAL HIGH (ref 0.61–1.24)
GFR calc Af Amer: 21 mL/min — ABNORMAL LOW (ref >60)
GFR calc non Af Amer: 19 mL/min — ABNORMAL LOW (ref >60)
Glucose, Bld: 137 mg/dL — ABNORMAL HIGH (ref 70–99)
Potassium: 4 mmol/L (ref 3.5–5.1)
Sodium: 136 mmol/L (ref 135–145)
TOTAL PROTEIN: 7.2 g/dL (ref 6.5–8.1)
Total Bilirubin: 1.2 mg/dL (ref 0.3–1.2)

## 2018-02-26 LAB — BASIC METABOLIC PANEL
Anion gap: 11 (ref 5–15)
BUN: 52 mg/dL — AB (ref 6–20)
CO2: 18 mmol/L — ABNORMAL LOW (ref 22–32)
CREATININE: 3.85 mg/dL — AB (ref 0.61–1.24)
Calcium: 8.8 mg/dL — ABNORMAL LOW (ref 8.9–10.3)
Chloride: 109 mmol/L (ref 98–111)
GFR calc Af Amer: 20 mL/min — ABNORMAL LOW (ref >60)
GFR calc non Af Amer: 17 mL/min — ABNORMAL LOW (ref >60)
Glucose, Bld: 301 mg/dL — ABNORMAL HIGH (ref 70–99)
Potassium: 5.1 mmol/L (ref 3.5–5.1)
Sodium: 138 mmol/L (ref 135–145)

## 2018-02-26 LAB — BLOOD CULTURE ID PANEL (REFLEXED)
Acinetobacter baumannii: NOT DETECTED
Candida albicans: NOT DETECTED
Candida glabrata: NOT DETECTED
Candida krusei: NOT DETECTED
Candida parapsilosis: NOT DETECTED
Candida tropicalis: NOT DETECTED
ENTEROCOCCUS SPECIES: NOT DETECTED
Enterobacter cloacae complex: NOT DETECTED
Enterobacteriaceae species: NOT DETECTED
Escherichia coli: NOT DETECTED
Haemophilus influenzae: NOT DETECTED
KLEBSIELLA OXYTOCA: NOT DETECTED
Klebsiella pneumoniae: NOT DETECTED
Listeria monocytogenes: NOT DETECTED
Neisseria meningitidis: NOT DETECTED
Proteus species: NOT DETECTED
Pseudomonas aeruginosa: NOT DETECTED
Serratia marcescens: NOT DETECTED
Staphylococcus aureus (BCID): NOT DETECTED
Staphylococcus species: NOT DETECTED
Streptococcus agalactiae: NOT DETECTED
Streptococcus pneumoniae: DETECTED — AB
Streptococcus pyogenes: NOT DETECTED
Streptococcus species: DETECTED — AB

## 2018-02-26 LAB — TROPONIN I: Troponin I: 0.15 ng/mL (ref <0.03)

## 2018-02-26 LAB — MRSA PCR SCREENING: MRSA by PCR: NEGATIVE

## 2018-02-26 LAB — GLUCOSE, CAPILLARY
GLUCOSE-CAPILLARY: 156 mg/dL — AB (ref 70–99)
Glucose-Capillary: 116 mg/dL — ABNORMAL HIGH (ref 70–99)
Glucose-Capillary: 116 mg/dL — ABNORMAL HIGH (ref 70–99)
Glucose-Capillary: 123 mg/dL — ABNORMAL HIGH (ref 70–99)
Glucose-Capillary: 128 mg/dL — ABNORMAL HIGH (ref 70–99)
Glucose-Capillary: 133 mg/dL — ABNORMAL HIGH (ref 70–99)
Glucose-Capillary: 177 mg/dL — ABNORMAL HIGH (ref 70–99)
Glucose-Capillary: 276 mg/dL — ABNORMAL HIGH (ref 70–99)
Glucose-Capillary: 361 mg/dL — ABNORMAL HIGH (ref 70–99)
Glucose-Capillary: 362 mg/dL — ABNORMAL HIGH (ref 70–99)
Glucose-Capillary: 370 mg/dL — ABNORMAL HIGH (ref 70–99)
Glucose-Capillary: 382 mg/dL — ABNORMAL HIGH (ref 70–99)

## 2018-02-26 LAB — HIV ANTIBODY (ROUTINE TESTING W REFLEX): HIV Screen 4th Generation wRfx: NONREACTIVE

## 2018-02-26 LAB — STREP PNEUMONIAE URINARY ANTIGEN: Strep Pneumo Urinary Antigen: NEGATIVE

## 2018-02-26 MED ORDER — INSULIN GLARGINE 100 UNIT/ML ~~LOC~~ SOLN
10.0000 [IU] | Freq: Every day | SUBCUTANEOUS | Status: DC
  Filled 2018-02-26: qty 0.1

## 2018-02-26 MED ORDER — INSULIN GLARGINE 100 UNIT/ML ~~LOC~~ SOLN
5.0000 [IU] | Freq: Once | SUBCUTANEOUS | Status: AC
  Administered 2018-02-26: 5 [IU] via SUBCUTANEOUS
  Filled 2018-02-26: qty 0.05

## 2018-02-26 MED ORDER — WARFARIN SODIUM 5 MG PO TABS
5.0000 mg | ORAL_TABLET | Freq: Once | ORAL | Status: AC
  Administered 2018-02-26: 5 mg via ORAL
  Filled 2018-02-26: qty 1

## 2018-02-26 MED ORDER — METFORMIN HCL 500 MG PO TABS
500.0000 mg | ORAL_TABLET | Freq: Every day | ORAL | Status: DC
  Administered 2018-02-26: 500 mg via ORAL
  Filled 2018-02-26: qty 1

## 2018-02-26 MED ORDER — DOXYCYCLINE HYCLATE 100 MG PO TABS: 100.0000 mg | ORAL_TABLET | Freq: Two times a day (BID) | ORAL | Status: DC

## 2018-02-26 MED ORDER — INSULIN ASPART 100 UNIT/ML ~~LOC~~ SOLN: 0.0000 [IU] | SUBCUTANEOUS | Status: DC

## 2018-02-26 MED ORDER — INSULIN ASPART 100 UNIT/ML ~~LOC~~ SOLN
0.0000 [IU] | Freq: Three times a day (TID) | SUBCUTANEOUS | Status: DC
  Administered 2018-02-26 (×2): 9 [IU] via SUBCUTANEOUS
  Administered 2018-02-27: 5 [IU] via SUBCUTANEOUS
  Administered 2018-02-27: 7 [IU] via SUBCUTANEOUS

## 2018-02-26 MED ORDER — INSULIN ASPART 100 UNIT/ML ~~LOC~~ SOLN
0.0000 [IU] | Freq: Every day | SUBCUTANEOUS | Status: DC
  Administered 2018-02-26: 5 [IU] via SUBCUTANEOUS

## 2018-02-26 MED ORDER — GLIPIZIDE 5 MG PO TABS
2.5000 mg | ORAL_TABLET | Freq: Two times a day (BID) | ORAL | Status: DC
  Administered 2018-02-26 (×2): 2.5 mg via ORAL
  Filled 2018-02-26 (×2): qty 1

## 2018-02-26 MED ORDER — LACTATED RINGERS IV SOLN
INTRAVENOUS | Status: DC
  Administered 2018-02-26 (×2): via INTRAVENOUS

## 2018-02-26 MED ORDER — SODIUM CHLORIDE 0.9 % IV SOLN
2.0000 g | INTRAVENOUS | Status: DC
  Administered 2018-02-26 – 2018-02-27 (×2): 2 g via INTRAVENOUS
  Filled 2018-02-26 (×3): qty 20

## 2018-02-26 MED ORDER — SODIUM CHLORIDE 0.9 % IV SOLN: 1.0000 g | INTRAVENOUS | Status: DC

## 2018-02-26 NOTE — Progress Notes (Addendum)
Initial Nutrition Assessment  DOCUMENTATION CODES:   Obesity unspecified  INTERVENTION:   Liberalize diet to carb modified (verbal with readback order placed per MD)  If poor po intake persists, RD to order nutrition supplement as needed.   NUTRITION DIAGNOSIS:   Inadequate oral intake related to decreased appetite as evidenced by per patient/family report.  GOAL:   Patient will meet greater than or equal to 90% of their needs   MONITOR:   PO intake, Diet advancement, Weight trends, Labs  REASON FOR ASSESSMENT:   Malnutrition Screening Tool    ASSESSMENT:   47 y.o. male with PMH chronic systolic CHF, CAD, HLD, HTN, T2DM, A fib. Admitted to ED with diabetic ketoacidosis and sepsis due to PNA.    Pt asleep when DI entered the room. Pt woke up and reported that he had eaten some eggs and toast for breakfast this morning and a little of the chicken and dumplings.   Pt reported eating 3 meals a day having grits, eggs and [place] sausage for breakfast, eating a sandwich around lunch and another family member cooking a meat and veggies for dinner.   Pt reported not having an appetite while in hospital and 2 days PTA. Pt stated that chicken and dumplings just didn't sound good. DI went over ordering foods with pt menu, but pt reported just not hungry.   UBW stated at 240#. Pt reported no weight loss in the past months. Per the chart, 10/16 267.7#  and 12/16 251.7#. Weight loss not significant for time frame.   DI encouraged pt to continue eating to maintain muscle mass while laying in hospital bed.   Medications reviewed and include:  Calcitriol Glucotrol Insulin sliding scale Metformin Warfarin IV lactated ringers continuous 75 ml/hr  Rocephin  Labs reviewed:  CBG 116, 133, 123, 361 X 12 hours  HA1C 6.7 (10/16) BUN 47 (H)  Creatinine 3.76 (H)  NUTRITION - FOCUSED PHYSICAL EXAM:    Most Recent Value  Orbital Region  Mild depletion  Upper Arm Region  No depletion   Thoracic and Lumbar Region  No depletion  Buccal Region  No depletion  Temple Region  Mild depletion  Clavicle Bone Region  No depletion  Clavicle and Acromion Bone Region  No depletion  Scapular Bone Region  No depletion  Dorsal Hand  No depletion  Patellar Region  Unable to assess [jeans on ]  Anterior Thigh Region  Unable to assess [jeans on ]  Posterior Calf Region  Unable to assess [jeans on ]  Edema (RD Assessment)  None  Hair  Reviewed  Eyes  Reviewed  Mouth  Reviewed  Skin  Reviewed  Nails  Reviewed       Diet Order:   Diet Order            Diet heart healthy/carb modified Room service appropriate? Yes; Fluid consistency: Thin  Diet effective now              EDUCATION NEEDS:   Not appropriate for education at this time  Skin:  Skin Assessment: Reviewed RN Assessment(dry and flaky)  Last BM:  unknown  Height:   Ht Readings from Last 1 Encounters:  02/25/18 5\' 10"  (1.778 m)    Weight:   Wt Readings from Last 1 Encounters:  02/25/18 114.4 kg    Ideal Body Weight:  75.45 kg  BMI:  Body mass index is 36.19 kg/m.  Estimated Nutritional Needs:   Kcal:  2400-2600 kcal/d  Protein:  120-130  g/d  Fluid:  2.4-2.6 L/d    Mauricia Area, MS, Dietetic Intern Pager: [phone] After hours Pager: [phone]

## 2018-02-26 NOTE — Progress Notes (Addendum)
PROGRESS NOTE                                                                                                                                                                                                             Patient Demographics:    Travis Bryan, is a 47 y.o. male, DOB - 1970/12/29, XLK:[id number]  Admit date - 02/25/2018   Admitting Physician Reubin Milan, MD  Outpatient Primary MD for the patient is Carroll Sage, MD  LOS - 1  Chief Complaint  Patient presents with  . Fever  . Chills  . Nausea       Brief Narrative   Travis Bryan is a 47 y.o. male with medical history significant of AICD, atrial flutter, chronic systolic CHF with EF 25 to 30%, CAD, history of MI, AICD placement, persistent atrial fibrillation with chads 2 VASC score of 4, essential hypertension, gout, hyperlipidemia, obesity, type 2 diabetes who is coming to the emergency department with complaints of progressively worse fever with chills, nausea associated with chills, fatigue and malaise.  He has also been coughing with production of yellowish sputum.  In the ER he was diagnosed with pneumonia, DKA with newly diagnosed diabetes mellitus type 2 and admitted to the hospital.   Subjective:    Travis Bryan today has, No headache, No chest pain, No abdominal pain - No Nausea, No new weakness tingling or numbness, Improved Cough - SOB.     Assessment  & Plan :     1.  DKA in a patient with newly diagnosed DM type II.  Noted A1c, has been treated with DKA protocol, gap has closed, placed on low-dose Glipizide along with sliding scale & Glucophage.  Diabetic and insulin education.  Monitor CBGs.  Continue gentle hydration with LR.  Lab Results  Component Value Date   HGBA1C 6.7 (A) 12/26/2017   CBG (last 3)  Recent Labs    02/26/18 0600 02/26/18 0707 02/26/18 0743  GLUCAP 116* 133* 123*    2.  Pneumonia.  No sepsis.  Likely elevated lactate due to DKA.  Repeat two-view chest  x-ray.  His cough is improved, currently afebrile, taper down antibiotics to doxycycline along with Rocephin and monitor.   Addendum.  Called by pharmacy that blood culture [facility] ID is positive for strep pneumo.  Increase Rocephin.  Stop doxy.   3.  Hypertension.  Currently on combination of Coreg, hydralazine and Norvasc.  Diuretics currently on hold will resume tomorrow and stop  IV fluids in the morning.  4.  Chronic atrial fibrillation.  Mali vas 2 score of 5.  On oral amiodarone, Coreg along with Coumadin.  Pharmacy monitoring INR.  5.  History of CAD.  Borderline troponin which is flat and and non-ACS pattern.  Continue Coreg, Imdur, Coumadin, statin, Ranexa.  Currently chest pain-free however EKG shows currently changed bundle branch block pattern.  Will check echocardiogram to evaluate EF and wall motion.  6.  Chronic systolic CHF.  Last known EF 20%.  Has AICD.  Currently dehydrated.  Gentle hydration, beta-blocker continued, ACE/ARB along with diuretics being avoided due to ARF and dehydration.  7.  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  Baseline creatinine is close to 2.7.  Gently hydrate, avoid nephrotoxins and repeat BMP in the morning.  8.  Dyslipidemia.  On statin.  9.  Gout.  On allopurinol.     Family Communication  :  None  Code Status :  Full  Disposition Plan  :  TBD  Consults  :  None  Procedures  :    TTE   DVT Prophylaxis  :  Coumadin  Lab Results  Component Value Date   INR 2.92 02/26/2018   INR 2.35 02/25/2018   INR 1.9 (A) 01/28/2018     Lab Results  Component Value Date   PLT 195 02/26/2018    Diet :  Diet Order            Diet heart healthy/carb modified Room service appropriate? Yes; Fluid consistency: Thin  Diet effective now               Inpatient Medications Scheduled Meds: . albuterol  2.5 mg Nebulization TID  . allopurinol  300 mg Oral Daily  . amiodarone  200 mg Oral Daily  . amLODipine  5 mg Oral Daily  . calcitRIOL  0.25 mcg Oral Daily  .  carvedilol  25 mg Oral BID WC  . doxycycline  100 mg Oral Q12H  . heparin  5,000 Units Subcutaneous Q8H  . hydrALAZINE  100 mg Oral TID  . Influenza vac split quadrivalent PF  0.5 mL Intramuscular Tomorrow-1000  . insulin aspart  0-5 Units Subcutaneous QHS  . insulin aspart  0-9 Units Subcutaneous TID WC  . insulin glargine  10 Units Subcutaneous Daily  . isosorbide mononitrate  90 mg Oral Daily  . ranolazine  500 mg Oral BID  . Warfarin - Pharmacist Dosing Inpatient   Does not apply q1800   Continuous Infusions: . sodium chloride    . cefTRIAXone (ROCEPHIN)  IV    . lactated ringers     PRN Meds:.[DISCONTINUED] ondansetron **OR** ondansetron (ZOFRAN) IV  Antibiotics  :   Anti-infectives (From admission, onward)   Start     Dose/Rate Route Frequency Ordered Stop   02/26/18 2000  ceFEPIme (MAXIPIME) 1 g in sodium chloride 0.9 % 100 mL IVPB  Status:  Discontinued     1 g 200 mL/hr over 30 Minutes Intravenous Every 24 hours 02/25/18 1652 02/26/18 1051   02/26/18 1100  doxycycline (VIBRA-TABS) tablet 100 mg     100 mg Oral Every 12 hours 02/26/18 1053 03/03/18 0959   02/26/18 1100  cefTRIAXone (ROCEPHIN) 1 g in sodium chloride 0.9 % 100 mL IVPB     1 g 200 mL/hr over 30 Minutes Intravenous Every 24 hours 02/26/18 1053 03/03/18 1059   02/25/18 1500  ceFEPIme (MAXIPIME) 2 g in sodium chloride 0.9 % 100 mL IVPB  2 g 200 mL/hr over 30 Minutes Intravenous  Once 02/25/18 1316 02/25/18 1852   02/25/18 1400  vancomycin (VANCOCIN) 2,000 mg in sodium chloride 0.9 % 500 mL IVPB     2,000 mg 250 mL/hr over 120 Minutes Intravenous  Once 02/25/18 1316 02/25/18 1712   02/25/18 1300  metroNIDAZOLE (FLAGYL) IVPB 500 mg  Status:  Discontinued     500 mg 100 mL/hr over 60 Minutes Intravenous Every 8 hours 02/25/18 1257 02/26/18 1044          Objective:   Vitals:   02/26/18 0747 02/26/18 0755 02/26/18 0812 02/26/18 0917  BP:   110/76 114/72  Pulse:   64 66  Resp:   (!) 25 20  Temp:        TempSrc:      SpO2: 98% 98% 95% 92%  Weight:      Height:        Wt Readings from Last 3 Encounters:  02/25/18 114.4 kg  12/26/17 121.7 kg  08/23/17 117.3 kg     Intake/Output Summary (Last 24 hours) at 02/26/2018 1053 Last data filed at 02/26/2018 0322 Gross per 24 hour  Intake 1927.03 ml  Output 300 ml  Net 1627.03 ml     Physical Exam  Awake Alert, Oriented X 3, No new F.N deficits, Normal affect [place].AT,PERRAL Supple Neck,No JVD, No cervical lymphadenopathy appriciated.  Symmetrical Chest wall movement, Good air movement bilaterally, CTAB RRR,No Gallops,Rubs or new Murmurs, No Parasternal Heave +ve B.Sounds, Abd Soft, No tenderness, No organomegaly appriciated, No rebound - guarding or rigidity. No Cyanosis, Clubbing or edema, No new Rash or bruise     Data Review:    CBC Recent Labs  Lab 02/25/18 1235 02/26/18 0358  WBC 16.9* 20.6*  HGB 15.2 13.9  HCT 49.9 42.2  PLT 204 195  MCV 96.5 89.2  MCH 29.4 29.4  MCHC 30.5 32.9  RDW 13.2 13.0  LYMPHSABS 0.6* 1.2  MONOABS 1.2* 1.6*  EOSABS 0.0 0.1  BASOSABS 0.0 0.1    Chemistries  Recent Labs  Lab 02/25/18 1235 02/25/18 1604 02/25/18 2027 02/26/18 0050 02/26/18 0358  NA 127* 124* 129* 138 136  K 5.7* 6.0* 5.4* 5.1 4.0  CL 89* 91* 94* 109 102  CO2 21* 16* 19* 18* 22  GLUCOSE 994* 904* 780* 301* 137*  BUN 53* 54* 55* 52* 47*  CREATININE 4.86* 4.80* 4.65* 3.85* 3.67*  CALCIUM 9.1 8.3* 8.5* 8.8* 8.6*  AST 31  --   --   --  29  ALT 27  --   --   --  21  ALKPHOS 176*  --   --   --  113  BILITOT 2.1*  --   --   --  1.2   ------------------------------------------------------------------------------------------------------------------ No results for input(s): CHOL, HDL, LDLCALC, TRIG, CHOLHDL, LDLDIRECT in the last 72 hours.  Lab Results  Component Value Date   HGBA1C 6.7 (A) 12/26/2017    ------------------------------------------------------------------------------------------------------------------ No results for input(s): TSH, T4TOTAL, T3FREE, THYROIDAB in the last 72 hours.  Invalid input(s): FREET3 ------------------------------------------------------------------------------------------------------------------ No results for input(s): VITAMINB12, FOLATE, FERRITIN, TIBC, IRON, RETICCTPCT in the last 72 hours.  Coagulation profile Recent Labs  Lab 02/25/18 1625 02/26/18 0358  INR 2.35 2.92    No results for input(s): DDIMER in the last 72 hours.  Cardiac Enzymes Recent Labs  Lab 02/25/18 1604 02/25/18 2027 02/26/18 0358  TROPONINI 0.10* 0.14* 0.15*   ------------------------------------------------------------------------------------------------------------------    Component Value Date/Time   BNP 207.2 (  H) 02/25/2018 1235   BNP 819.4 (H) 01/07/2015 1034    Micro Results Recent Results (from the past 240 hour(s))  Blood Culture (routine x 2)     Status: None (Preliminary result)   Collection Time: 02/25/18 12:33 PM  Result Value Ref Range Status   Specimen Description BLOOD RIGHT ANTECUBITAL  Final   Special Requests   Final    BOTTLES DRAWN AEROBIC AND ANAEROBIC Blood Culture adequate volume   Culture   Final    NO GROWTH < 24 HOURS Performed at [facility], [place] [address] [place], [place] [address]    Report Status PENDING  Incomplete  Blood Culture (routine x 2)     Status: None (Preliminary result)   Collection Time: 02/25/18  5:05 PM  Result Value Ref Range Status   Specimen Description BLOOD RIGHT HAND  Final   Special Requests   Final    BOTTLES DRAWN AEROBIC AND ANAEROBIC Blood Culture adequate volume   Culture  Setup Time   Final    GRAM POSITIVE COCCI IN CHAINS AEROBIC BOTTLE ONLY Organism ID to follow Performed at [facility], [place] [address] [place], [place] [address]    Culture PENDING  Incomplete   Report  Status PENDING  Incomplete  MRSA PCR Screening     Status: None   Collection Time: 02/25/18 10:44 PM  Result Value Ref Range Status   MRSA by PCR NEGATIVE NEGATIVE Final    Comment:        The GeneXpert MRSA Assay (FDA approved for NASAL specimens only), is one component of a comprehensive MRSA colonization surveillance program. It is not intended to diagnose MRSA infection nor to guide or monitor treatment for MRSA infections. Performed at [facility], [place] [address] [place], [place] [address]     Radiology Reports Dg Chest Portable 1 View  Result Date: 02/25/2018 CLINICAL DATA:  Shortness of breath. EXAM: PORTABLE CHEST 1 VIEW COMPARISON:  04/01/2016 FINDINGS: Pacemaker/AICD in place. Chronic cardiomegaly. The right lung is clear. There is abnormal density in the left lower lung that could be due to pneumonia or a posterior left effusion. IMPRESSION: Question left lower lobe pneumonia versus left effusion. Electronically Signed   By: Nelson Chimes M.D.   On: 02/25/2018 13:01    Time Spent in minutes  30   Lala Lund M.D on 02/26/2018 at 10:53 AM  To page go to [email or web address] - password [facility]

## 2018-02-26 NOTE — Progress Notes (Signed)
PHARMACY - PHYSICIAN COMMUNICATION CRITICAL VALUE ALERT - BLOOD CULTURE IDENTIFICATION (BCID)  Travis Bryan is an 47 y.o. male who presented to [facility] on 02/25/2018 with a chief complaint of fever and chills  Assessment:  46 yo M presents with sepsis due to probable PNA. Blood cx now showing strep pneumo.  Name of physician (or Provider) Contacted: P. Singh  Current antibiotics: Ceftriaxone  Changes to prescribed antibiotics recommended:  Increase to ceftriaxone 2g IV Q24h Consider stopping doxycycline Consider 7-10 days of therapy. If rapidly improves, could consider PO abx  Results for orders placed or performed during the hospital encounter of 02/25/18  Blood Culture ID Panel (Reflexed) (Collected: 02/25/2018  5:05 PM)  Result Value Ref Range   Enterococcus species NOT DETECTED NOT DETECTED   Listeria monocytogenes NOT DETECTED NOT DETECTED   Staphylococcus species NOT DETECTED NOT DETECTED   Staphylococcus aureus (BCID) NOT DETECTED NOT DETECTED   Streptococcus species DETECTED (A) NOT DETECTED   Streptococcus agalactiae NOT DETECTED NOT DETECTED   Streptococcus pneumoniae DETECTED (A) NOT DETECTED   Streptococcus pyogenes NOT DETECTED NOT DETECTED   Acinetobacter baumannii NOT DETECTED NOT DETECTED   Enterobacteriaceae species NOT DETECTED NOT DETECTED   Enterobacter cloacae complex NOT DETECTED NOT DETECTED   Escherichia coli NOT DETECTED NOT DETECTED   Klebsiella oxytoca NOT DETECTED NOT DETECTED   Klebsiella pneumoniae NOT DETECTED NOT DETECTED   Proteus species NOT DETECTED NOT DETECTED   Serratia marcescens NOT DETECTED NOT DETECTED   Haemophilus influenzae NOT DETECTED NOT DETECTED   Neisseria meningitidis NOT DETECTED NOT DETECTED   Pseudomonas aeruginosa NOT DETECTED NOT DETECTED   Candida albicans NOT DETECTED NOT DETECTED   Candida glabrata NOT DETECTED NOT DETECTED   Candida krusei NOT DETECTED NOT DETECTED   Candida parapsilosis NOT DETECTED NOT  DETECTED   Candida tropicalis NOT DETECTED NOT DETECTED    Travis Bryan 02/26/2018  11:02 AM

## 2018-02-26 NOTE — Progress Notes (Signed)
ANTICOAGULATION CONSULT NOTE - Initial Consult  Pharmacy Consult for Warfarin Indication: atrial fibrillation  Allergies  Allergen Reactions  . No Known Allergies     Patient Measurements: Height: 5\' 10"  (177.8 cm) Weight: 252 lb 3.3 oz (114.4 kg) IBW/kg (Calculated) : 73  Vital Signs: Temp: 99.7 F (37.6 C) (12/17 0456) Temp Source: Oral (12/17 0456) BP: 114/72 (12/17 0917) Pulse Rate: 66 (12/17 0917)  Labs: Recent Labs    02/25/18 1235 02/25/18 1604 02/25/18 1625 02/25/18 2027 02/26/18 0050 02/26/18 0358  HGB 15.2  --   --   --   --  13.9  HCT 49.9  --   --   --   --  42.2  PLT 204  --   --   --   --  195  APTT  --   --  42*  --   --   --   LABPROT  --   --  25.4*  --   --  30.1*  INR  --   --  2.35  --   --  2.92  CREATININE 4.86* 4.80*  --  4.65* 3.85* 3.67*  CKTOTAL  --   --   --  818*  --   --   TROPONINI  --  0.10*  --  0.14*  --  0.15*    Estimated Creatinine Clearance: 31.5 mL/min (A) (by C-G formula based on SCr of 3.67 mg/dL (H)).   Medical History: Past Medical History:  Diagnosis Date  . AICD (automatic cardioverter/defibrillator) present   . Atrial flutter ([place])    a. s/p ablation 03/2016  . Chronic systolic CHF (congestive heart failure) ([place])   . Essential hypertension 12/26/2005   Qualifier: Diagnosis of  By: Jerilee Hoh MD, Olam Idler    . History of gout   . Hyperlipidemia   . Hypertension   . Myocardial infarction [facility])    "I think I had one a long long time ago" (03/28/2016)  . NICM (nonischemic cardiomyopathy) ([place])    a. LHC 6/06: pLAD 20, pLCx 20-30; b. Echo 5/15:  EF 15%, diffuse HK, restrictive physiology, trivial AI, trivial MR, mild LAE, moderate RVE, moderately reduced RVSF, moderate RAE, mild to moderate TR, PASP 43 mmHg  . Obesity   . Persistent atrial fibrillation   . Type II diabetes mellitus ([facility])     Assessment: 89 YOM on warfarin PTA for hx Afib. Admit INR 2.35 on PTA dose. Pharmacy consulted to resume dosing this  admission.  PTA dosing: 5mg  daily except 7.5mg  on MWF  INR therapeutic at 2.92, flagyl has been d/c today, CBC wnl.    Goal of Therapy:  INR 2-3 Monitor platelets by anticoagulation protocol: Yes   Plan:  Warfarin 5mg  PO x 1 tonight Daily INR, s/s bleeding  Bertis Ruddy, PharmD Clinical Pharmacist Please check AMION for all [place] numbers 02/26/2018 11:08 AM

## 2018-02-26 NOTE — Progress Notes (Addendum)
Inpatient Diabetes Program Recommendations  AACE/ADA: New Consensus Statement on Inpatient Glycemic Control (2015)  Target Ranges:  Prepandial:   less than 140 mg/dL      Peak postprandial:   less than 180 mg/dL (1-2 hours)      Critically ill patients:  140 - 180 mg/dL   Results for Travis Bryan, Travis Bryan (MRN [id number]) as of 02/26/2018 09:50  Ref. Range 02/25/2018 15:37 02/25/2018 17:18 02/25/2018 18:42 02/25/2018 19:58 02/25/2018 21:03 02/25/2018 22:02 02/25/2018 23:00 02/25/2018 23:57 02/26/2018 01:01 02/26/2018 02:02 02/26/2018 03:02 02/26/2018 04:01 02/26/2018 04:59 02/26/2018 06:00 02/26/2018 07:07 02/26/2018 07:43  Glucose-Capillary Latest Ref Range: 70 - 99 mg/dL >600 (HH)  IV Insulin Drip >600 (HH)  IV Insulin Drip >600 (HH)  IV Insulin Drip >600 (HH)  IV Insulin Drip >600 (HH)  IV Insulin Drip 588 (HH)  IV Insulin Drip 529 (HH)  IV Insulin Drip 382 (H)  IV Insulin Drip 276 (H)  IV Insulin Drip 177 (H)  IV Insulin Drip 156 (H)  IV Insulin Drip 128 (H)  IV Insulin Drip 116 (H)  IV Insulin Drip 116 (H)  IV Insulin Drip 133 (H)  IV Insulin Drip   123 (H)  5 units NOVOLOG  Drip OFF at 8:10am    Results for Travis Bryan, Travis Bryan (MRN [id number]) as of 02/26/2018 09:50  Ref. Range 08/23/2017 09:12 12/26/2017 13:39  Hemoglobin A1C Latest Ref Range: 4.0 - 5.6 % 7.0 (A) 6.7 (A)    Admit with: DKA/ Sepsis/ Pneumonia/ Glucose 994 mg/dl on admission  History: DM, CHF  Home DM Meds: Lantus 10 units QHS (NOT taking)  Current Orders: Novolog Moderate Correction Scale/ SSI (0-15 units) Q4 hours      PCP: Nita Sickle with the [facility]--Last seen 12/26/2017.    At that visit, Mr. Haile told his PCP he was NOT taking his Lantus as previously instructed.  A1c at that visit was 6.7%.  PCP gave pt instructions to continue to make lifestyle modifications and come back in 3 months for another A1c check.      --Will follow patient during  hospitalization--  Wyn Quaker RN, MSN, CDE Diabetes Coordinator Inpatient Glycemic Control Team Team Pager: [phone] (8a-5p)

## 2018-02-27 ENCOUNTER — Inpatient Hospital Stay (HOSPITAL_COMMUNITY): Payer: Medicaid Other

## 2018-02-27 DIAGNOSIS — I370 Nonrheumatic pulmonary valve stenosis: Secondary | ICD-10-CM

## 2018-02-27 DIAGNOSIS — J181 Lobar pneumonia, unspecified organism: Secondary | ICD-10-CM

## 2018-02-27 DIAGNOSIS — I361 Nonrheumatic tricuspid (valve) insufficiency: Secondary | ICD-10-CM

## 2018-02-27 DIAGNOSIS — R7989 Other specified abnormal findings of blood chemistry: Secondary | ICD-10-CM

## 2018-02-27 LAB — CBC WITH DIFFERENTIAL/PLATELET
Abs Immature Granulocytes: 0.04 10*3/uL (ref 0.00–0.07)
Basophils Absolute: 0 10*3/uL (ref 0.0–0.1)
Basophils Relative: 0 %
Eosinophils Absolute: 0.3 10*3/uL (ref 0.0–0.5)
Eosinophils Relative: 3 %
HCT: 40.9 % (ref 39.0–52.0)
Hemoglobin: 13.6 g/dL (ref 13.0–17.0)
IMMATURE GRANULOCYTES: 0 %
Lymphocytes Relative: 11 %
Lymphs Abs: 1 10*3/uL (ref 0.7–4.0)
MCH: 30.5 pg (ref 26.0–34.0)
MCHC: 33.3 g/dL (ref 30.0–36.0)
MCV: 91.7 fL (ref 80.0–100.0)
Monocytes Absolute: 0.9 10*3/uL (ref 0.1–1.0)
Monocytes Relative: 10 %
NRBC: 0 % (ref 0.0–0.2)
Neutro Abs: 7 10*3/uL (ref 1.7–7.7)
Neutrophils Relative %: 76 %
Platelets: 178 10*3/uL (ref 150–400)
RBC: 4.46 MIL/uL (ref 4.22–5.81)
RDW: 13.1 % (ref 11.5–15.5)
WBC: 9.2 10*3/uL (ref 4.0–10.5)

## 2018-02-27 LAB — BASIC METABOLIC PANEL
Anion gap: 13 (ref 5–15)
BUN: 46 mg/dL — ABNORMAL HIGH (ref 6–20)
CALCIUM: 8.4 mg/dL — AB (ref 8.9–10.3)
CO2: 20 mmol/L — ABNORMAL LOW (ref 22–32)
Chloride: 100 mmol/L (ref 98–111)
Creatinine, Ser: 3.37 mg/dL — ABNORMAL HIGH (ref 0.61–1.24)
GFR calc Af Amer: 24 mL/min — ABNORMAL LOW (ref >60)
GFR calc non Af Amer: 21 mL/min — ABNORMAL LOW (ref >60)
Glucose, Bld: 317 mg/dL — ABNORMAL HIGH (ref 70–99)
Potassium: 4 mmol/L (ref 3.5–5.1)
SODIUM: 133 mmol/L — AB (ref 135–145)

## 2018-02-27 LAB — ECHOCARDIOGRAM COMPLETE
Height: 70 in
WEIGHTICAEL: 4035.3 [oz_av]

## 2018-02-27 LAB — GLUCOSE, CAPILLARY
Glucose-Capillary: 293 mg/dL — ABNORMAL HIGH (ref 70–99)
Glucose-Capillary: 342 mg/dL — ABNORMAL HIGH (ref 70–99)
Glucose-Capillary: 332 mg/dL — ABNORMAL HIGH (ref 70–99)
Glucose-Capillary: 376 mg/dL — ABNORMAL HIGH (ref 70–99)
Glucose-Capillary: 281 mg/dL — ABNORMAL HIGH (ref 70–99)

## 2018-02-27 LAB — PROTIME-INR
INR: 3.05
Prothrombin Time: 31.1 seconds — ABNORMAL HIGH (ref 11.4–15.2)

## 2018-02-27 LAB — HEMOGLOBIN A1C
Hgb A1c MFr Bld: 13.3 % — ABNORMAL HIGH (ref 4.8–5.6)
Mean Plasma Glucose: 335.01 mg/dL

## 2018-02-27 LAB — MAGNESIUM: MAGNESIUM: 2.5 mg/dL — AB (ref 1.7–2.4)

## 2018-02-27 MED ORDER — HYDROCODONE-ACETAMINOPHEN 5-325 MG PO TABS
1.0000 | ORAL_TABLET | Freq: Four times a day (QID) | ORAL | Status: DC | PRN
  Administered 2018-02-27 (×2): 1 via ORAL
  Filled 2018-02-27 (×2): qty 1

## 2018-02-27 MED ORDER — INSULIN GLARGINE 100 UNIT/ML ~~LOC~~ SOLN
20.0000 [IU] | Freq: Two times a day (BID) | SUBCUTANEOUS | Status: DC
  Administered 2018-02-27 (×2): 20 [IU] via SUBCUTANEOUS
  Filled 2018-02-27 (×3): qty 0.2

## 2018-02-27 MED ORDER — INSULIN ASPART 100 UNIT/ML ~~LOC~~ SOLN
0.0000 [IU] | Freq: Three times a day (TID) | SUBCUTANEOUS | Status: DC
  Administered 2018-02-27: 15 [IU] via SUBCUTANEOUS
  Administered 2018-02-28: 5 [IU] via SUBCUTANEOUS

## 2018-02-27 MED ORDER — WARFARIN SODIUM 5 MG PO TABS
5.0000 mg | ORAL_TABLET | Freq: Once | ORAL | Status: AC
  Administered 2018-02-27: 5 mg via ORAL
  Filled 2018-02-27: qty 1

## 2018-02-27 MED ORDER — GLIPIZIDE 5 MG PO TABS
5.0000 mg | ORAL_TABLET | Freq: Two times a day (BID) | ORAL | Status: DC
  Administered 2018-02-27: 5 mg via ORAL
  Filled 2018-02-27: qty 1

## 2018-02-27 MED ORDER — INSULIN ASPART 100 UNIT/ML ~~LOC~~ SOLN
0.0000 [IU] | Freq: Every day | SUBCUTANEOUS | Status: DC
  Administered 2018-02-27: 3 [IU] via SUBCUTANEOUS

## 2018-02-27 NOTE — Progress Notes (Signed)
  Echocardiogram 2D Echocardiogram has been performed.  Randa Lynn Dyane Broberg 02/27/2018, 3:49 PM

## 2018-02-27 NOTE — Progress Notes (Signed)
PROGRESS NOTE                                                                                                                                                                                                             Patient Demographics:    Travis Bryan, is a 47 y.o. male, DOB - 1971-01-28, BOF:[id number]  Admit date - 02/25/2018   Admitting Physician Reubin Milan, MD  Outpatient Primary MD for the patient is Carroll Sage, MD  LOS - 2  Chief Complaint  Patient presents with  . Fever  . Chills  . Nausea       Brief Narrative   Travis Bryan is a 48 y.o. male with medical history significant of AICD, atrial flutter, chronic systolic CHF with EF 25 to 30%, CAD, history of MI, AICD placement, persistent atrial fibrillation with chads 2 VASC score of 4, essential hypertension, gout, hyperlipidemia, obesity, type 2 diabetes who is coming to the emergency department with complaints of progressively worse fever with chills, nausea associated with chills, fatigue and malaise.  He has also been coughing with production of yellowish sputum.  In the ER he was diagnosed with pneumonia, DKA with newly diagnosed diabetes mellitus type 2 and admitted to the hospital.   Subjective:   Patient in bed, appears comfortable, denies any headache, no fever, no chest pain or pressure, no shortness of breath , no abdominal pain. No focal weakness.     Assessment  & Plan :     1.  DKA in a patient with newly diagnosed DM type II.  Noted A1c, has been treated with DKA protocol, gap has closed, placed on low-dose Glipizide along with sliding scale , increase Glipizide dose for better control. No Glucophage due to CKD5. Diabetic and insulin education ordered.  Monitor CBGs.  Continue gentle hydration with LR.  Lab Results  Component Value Date   HGBA1C 6.7 (A) 12/26/2017   CBG (last 3)  Recent Labs    02/26/18 1742 02/26/18 2203 02/27/18 0801  GLUCAP 362* 370* 293*    2.  Strep Pneum CAP with Bacteremia and Sepsis.  On IV Rocpehin and seems to have defervesced, follow final sensitivities - likely x 10 day PO Augmentin and DC in am if better.    3.  Hypertension.  Currently on combination of Coreg, hydralazine and Norvasc.  Diuretics currently on hold will resume tomorrow and stop IV fluids in the morning.  4.  Chronic atrial fibrillation.  Mali vas  2 score of 5.  On oral amiodarone, Coreg along with Coumadin.  Pharmacy monitoring INR.  5.  History of CAD.  Borderline troponin which is flat and and non-ACS pattern.  Continue Coreg, Imdur, Coumadin, statin, Ranexa.  Currently chest pain-free however EKG shows currently changed bundle branch block pattern.  Will check echocardiogram to evaluate EF and wall motion.  6.  Chronic systolic CHF.  Last known EF 20%.  Has AICD.  Currently dehydrated.  Gentle hydration, beta-blocker continued, ACE/ARB along with diuretics being avoided due to ARF and dehydration.  7.  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  Baseline creatinine is close to 2.7. have hydrated, improved, hold diuretics another day, stop IVF today, avoid nephrotoxins and repeat BMP in the morning.  8.  Dyslipidemia.  On statin.  9.  Gout.  On allopurinol.     Family Communication  :  None  Code Status :  Full  Disposition Plan  :  Home in am if stable  Consults  :  None  Procedures  :    TTE   DVT Prophylaxis  :  Coumadin  Lab Results  Component Value Date   INR 3.05 02/27/2018   INR 2.92 02/26/2018   INR 2.35 02/25/2018     Lab Results  Component Value Date   PLT 178 02/27/2018    Diet :  Diet Order            Diet Carb Modified Fluid consistency: Thin; Room service appropriate? Yes  Diet effective now               Inpatient Medications Scheduled Meds: . albuterol  2.5 mg Nebulization TID  . allopurinol  300 mg Oral Daily  . amiodarone  200 mg Oral Daily  . amLODipine  5 mg Oral Daily  . calcitRIOL  0.25 mcg Oral Daily  . carvedilol  25 mg  Oral BID WC  . glipiZIDE  5 mg Oral BID AC  . hydrALAZINE  100 mg Oral TID  . Influenza vac split quadrivalent PF  0.5 mL Intramuscular Tomorrow-1000  . insulin aspart  0-5 Units Subcutaneous QHS  . insulin aspart  0-9 Units Subcutaneous TID WC  . isosorbide mononitrate  90 mg Oral Daily  . ranolazine  500 mg Oral BID  . warfarin  5 mg Oral ONCE-1800  . Warfarin - Pharmacist Dosing Inpatient   Does not apply q1800   Continuous Infusions: . sodium chloride    . cefTRIAXone (ROCEPHIN)  IV Stopped (02/26/18 2046)   PRN Meds:.[DISCONTINUED] ondansetron **OR** ondansetron (ZOFRAN) IV  Antibiotics  :   Anti-infectives (From admission, onward)   Start     Dose/Rate Route Frequency Ordered Stop   02/26/18 2000  ceFEPIme (MAXIPIME) 1 g in sodium chloride 0.9 % 100 mL IVPB  Status:  Discontinued     1 g 200 mL/hr over 30 Minutes Intravenous Every 24 hours 02/25/18 1652 02/26/18 1051   02/26/18 1200  cefTRIAXone (ROCEPHIN) 2 g in sodium chloride 0.9 % 100 mL IVPB     2 g 200 mL/hr over 30 Minutes Intravenous Every 24 hours 02/26/18 1101     02/26/18 1100  doxycycline (VIBRA-TABS) tablet 100 mg  Status:  Discontinued     100 mg Oral Every 12 hours 02/26/18 1053 02/26/18 1118   02/26/18 1100  cefTRIAXone (ROCEPHIN) 1 g in sodium chloride 0.9 % 100 mL IVPB  Status:  Discontinued     1 g 200 mL/hr over 30 Minutes Intravenous Every  24 hours 02/26/18 1053 02/26/18 1101   02/25/18 1500  ceFEPIme (MAXIPIME) 2 g in sodium chloride 0.9 % 100 mL IVPB     2 g 200 mL/hr over 30 Minutes Intravenous  Once 02/25/18 1316 02/25/18 1852   02/25/18 1400  vancomycin (VANCOCIN) 2,000 mg in sodium chloride 0.9 % 500 mL IVPB     2,000 mg 250 mL/hr over 120 Minutes Intravenous  Once 02/25/18 1316 02/25/18 1712   02/25/18 1300  metroNIDAZOLE (FLAGYL) IVPB 500 mg  Status:  Discontinued     500 mg 100 mL/hr over 60 Minutes Intravenous Every 8 hours 02/25/18 1257 02/26/18 1044          Objective:   Vitals:    02/27/18 0607 02/27/18 0726 02/27/18 0803 02/27/18 0833  BP: 111/69   109/66  Pulse: 79     Resp: 20     Temp: 99.1 F (37.3 C)  98.3 F (36.8 C)   TempSrc: Oral  Oral   SpO2: 94% 97%    Weight:      Height:        Wt Readings from Last 3 Encounters:  02/25/18 114.4 kg  12/26/17 121.7 kg  08/23/17 117.3 kg     Intake/Output Summary (Last 24 hours) at 02/27/2018 0936 Last data filed at 02/26/2018 1504 Gross per 24 hour  Intake 387.88 ml  Output -  Net 387.88 ml     Physical Exam  Awake Alert, Oriented X 3, No new F.N deficits, Normal affect [place].AT,PERRAL Supple Neck,No JVD, No cervical lymphadenopathy appriciated.  Symmetrical Chest wall movement, Good air movement bilaterally, CTAB RRR,No Gallops, Rubs or new Murmurs, No Parasternal Heave +ve B.Sounds, Abd Soft, No tenderness, No organomegaly appriciated, No rebound - guarding or rigidity. No Cyanosis, Clubbing or edema, No new Rash or bruise     Data Review:    CBC Recent Labs  Lab 02/25/18 1235 02/26/18 0358 02/27/18 0332  WBC 16.9* 20.6* 9.2  HGB 15.2 13.9 13.6  HCT 49.9 42.2 40.9  PLT 204 195 178  MCV 96.5 89.2 91.7  MCH 29.4 29.4 30.5  MCHC 30.5 32.9 33.3  RDW 13.2 13.0 13.1  LYMPHSABS 0.6* 1.2 1.0  MONOABS 1.2* 1.6* 0.9  EOSABS 0.0 0.1 0.3  BASOSABS 0.0 0.1 0.0    Chemistries  Recent Labs  Lab 02/25/18 1235 02/25/18 1604 02/25/18 2027 02/26/18 0050 02/26/18 0358 02/27/18 0332  NA 127* 124* 129* 138 136 133*  K 5.7* 6.0* 5.4* 5.1 4.0 4.0  CL 89* 91* 94* 109 102 100  CO2 21* 16* 19* 18* 22 20*  GLUCOSE 994* 904* 780* 301* 137* 317*  BUN 53* 54* 55* 52* 47* 46*  CREATININE 4.86* 4.80* 4.65* 3.85* 3.67* 3.37*  CALCIUM 9.1 8.3* 8.5* 8.8* 8.6* 8.4*  MG  --   --   --   --   --  2.5*  AST 31  --   --   --  29  --   ALT 27  --   --   --  21  --   ALKPHOS 176*  --   --   --  113  --   BILITOT 2.1*  --   --   --  1.2  --     ------------------------------------------------------------------------------------------------------------------ No results for input(s): CHOL, HDL, LDLCALC, TRIG, CHOLHDL, LDLDIRECT in the last 72 hours.  Lab Results  Component Value Date   HGBA1C 6.7 (A) 12/26/2017   ------------------------------------------------------------------------------------------------------------------ No results for input(s): TSH, T4TOTAL, T3FREE,  THYROIDAB in the last 72 hours.  Invalid input(s): FREET3 ------------------------------------------------------------------------------------------------------------------ No results for input(s): VITAMINB12, FOLATE, FERRITIN, TIBC, IRON, RETICCTPCT in the last 72 hours.  Coagulation profile Recent Labs  Lab 02/25/18 1625 02/26/18 0358 02/27/18 0332  INR 2.35 2.92 3.05    No results for input(s): DDIMER in the last 72 hours.  Cardiac Enzymes Recent Labs  Lab 02/25/18 1604 02/25/18 2027 02/26/18 0358  TROPONINI 0.10* 0.14* 0.15*   ------------------------------------------------------------------------------------------------------------------    Component Value Date/Time   BNP 207.2 (H) 02/25/2018 1235   BNP 819.4 (H) 01/07/2015 1034    Micro Results Recent Results (from the past 240 hour(s))  Blood Culture (routine x 2)     Status: None (Preliminary result)   Collection Time: 02/25/18 12:33 PM  Result Value Ref Range Status   Specimen Description BLOOD RIGHT ANTECUBITAL  Final   Special Requests   Final    BOTTLES DRAWN AEROBIC AND ANAEROBIC Blood Culture adequate volume   Culture   Final    NO GROWTH 2 DAYS Performed at [facility], [place] [address], [place], [place] [address]    Report Status PENDING  Incomplete  Blood Culture (routine x 2)     Status: Abnormal (Preliminary result)   Collection Time: 02/25/18  5:05 PM  Result Value Ref Range Status   Specimen Description BLOOD RIGHT HAND  Final   Special Requests   Final     BOTTLES DRAWN AEROBIC AND ANAEROBIC Blood Culture adequate volume   Culture  Setup Time   Final    GRAM POSITIVE COCCI IN CHAINS AEROBIC BOTTLE ONLY Organism ID to follow CRITICAL RESULT CALLED TO, READ BACK BY AND VERIFIED WITH: Karlene Einstein PharmD 11:00 02/26/18 ([email or web address])    Culture (A)  Final    STREPTOCOCCUS PNEUMONIAE SUSCEPTIBILITIES TO FOLLOW Performed at [facility], [place] [address] [place], [place] [address]    Report Status PENDING  Incomplete  Blood Culture ID Panel (Reflexed)     Status: Abnormal   Collection Time: 02/25/18  5:05 PM  Result Value Ref Range Status   Enterococcus species NOT DETECTED NOT DETECTED Final   Listeria monocytogenes NOT DETECTED NOT DETECTED Final   Staphylococcus species NOT DETECTED NOT DETECTED Final   Staphylococcus aureus (BCID) NOT DETECTED NOT DETECTED Final   Streptococcus species DETECTED (A) NOT DETECTED Final    Comment: CRITICAL RESULT CALLED TO, READ BACK BY AND VERIFIED WITH: Karlene Einstein PharmD 11:00 02/26/18 ([email or web address])    Streptococcus agalactiae NOT DETECTED NOT DETECTED Final   Streptococcus pneumoniae DETECTED (A) NOT DETECTED Final    Comment: CRITICAL RESULT CALLED TO, READ BACK BY AND VERIFIED WITH: Karlene Einstein PharmD 11:00 02/26/18 ([email or web address])    Streptococcus pyogenes NOT DETECTED NOT DETECTED Final   Acinetobacter baumannii NOT DETECTED NOT DETECTED Final   Enterobacteriaceae species NOT DETECTED NOT DETECTED Final   Enterobacter cloacae complex NOT DETECTED NOT DETECTED Final   Escherichia coli NOT DETECTED NOT DETECTED Final   Klebsiella oxytoca NOT DETECTED NOT DETECTED Final   Klebsiella pneumoniae NOT DETECTED NOT DETECTED Final   Proteus species NOT DETECTED NOT DETECTED Final   Serratia marcescens NOT DETECTED NOT DETECTED Final   Haemophilus influenzae NOT DETECTED NOT DETECTED Final   Neisseria meningitidis NOT DETECTED NOT DETECTED Final   Pseudomonas aeruginosa NOT DETECTED NOT DETECTED Final    Candida albicans NOT DETECTED NOT DETECTED Final   Candida glabrata NOT DETECTED NOT DETECTED Final   Candida krusei NOT DETECTED NOT DETECTED  Final   Candida parapsilosis NOT DETECTED NOT DETECTED Final   Candida tropicalis NOT DETECTED NOT DETECTED Final    Comment: Performed at [facility], [place] [address] [place], [place] [address]  MRSA PCR Screening     Status: None   Collection Time: 02/25/18 10:44 PM  Result Value Ref Range Status   MRSA by PCR NEGATIVE NEGATIVE Final    Comment:        The GeneXpert MRSA Assay (FDA approved for NASAL specimens only), is one component of a comprehensive MRSA colonization surveillance program. It is not intended to diagnose MRSA infection nor to guide or monitor treatment for MRSA infections. Performed at [facility], [place] [address], [place], [name] [address]     Radiology Reports Dg Chest 2 View  Result Date: 02/26/2018 CLINICAL DATA:  Productive cough. EXAM: CHEST - 2 VIEW COMPARISON:  February 25, 2018 FINDINGS: Cardiomegaly. Stable AICD device. Patchy bilateral infiltrates, stable on the left and increased on the right in the interval. No other acute abnormalities. IMPRESSION: 1. Patchy bilateral pulmonary infiltrates, stable on the left and mildly increased on the right in the interval could represent a multifocal pneumonia versus asymmetric edema. Recommend clinical correlation. Electronically Signed   By: Dorise Bullion III M.D   On: 02/26/2018 13:42   Dg Chest Portable 1 View  Result Date: 02/25/2018 CLINICAL DATA:  Shortness of breath. EXAM: PORTABLE CHEST 1 VIEW COMPARISON:  04/01/2016 FINDINGS: Pacemaker/AICD in place. Chronic cardiomegaly. The right lung is clear. There is abnormal density in the left lower lung that could be due to pneumonia or a posterior left effusion. IMPRESSION: Question left lower lobe pneumonia versus left effusion. Electronically Signed   By: Nelson Chimes M.D.   On: 02/25/2018 13:01     Time Spent in minutes  30   Lala Lund M.D on 02/27/2018 at 9:36 AM  To page go to [email or web address] - password [facility]

## 2018-02-27 NOTE — Progress Notes (Signed)
Inpatient Diabetes Program Recommendations  AACE/ADA: New Consensus Statement on Inpatient Glycemic Control (2015)  Target Ranges:  Prepandial:   less than 140 mg/dL      Peak postprandial:   less than 180 mg/dL (1-2 hours)      Critically ill patients:  140 - 180 mg/dL   Lab Results  Component Value Date   GLUCAP 293 (H) 02/27/2018   HGBA1C 6.7 (A) 12/26/2017    Review of Glycemic Control Results for Travis Bryan, Travis Bryan (MRN [id number]) as of 02/27/2018 09:52  Ref. Range 02/26/2018 11:43 02/26/2018 17:42 02/26/2018 22:03 02/27/2018 08:01  Glucose-Capillary Latest Ref Range: 70 - 99 mg/dL 361 (H) 362 (H) 370 (H) 293 (H)   Diabetes history: Type 2 DM Admit with: DKA/ Sepsis/ Pneumonia/ Glucose 994 mg/dl on admission  Home DM Meds: Lantus 10 units QHS (NOT taking) Current Orders: Novolog 0-9 units TID, Novolog 0-5 units QHS, Glipizide 5 mg BID  Inpatient Diabetes Program Recommendations:    Noted last A1C was 6.7% from 12/26/17 and at that visit patient had not been taking prescribed Lantus. Has a history of elevated A1C of >14% back in 2012. Would recommend adding on A1C.   Patient did receive Lantus 5 units x1, however, blood glucose trends continue to remain elevated post transition off insulin drip. Recommend adding Lantus 10 units QD.   Thanks,  Bronson Curb, MSN, RNC-OB Diabetes Coordinator [phone] (8a-5p)

## 2018-02-27 NOTE — Care Management Note (Addendum)
Case Management Note  Patient Details  Name: Travis Bryan MRN: [id number] Date of Birth: 09/19/1970  Subjective/Objective: DKA.From home with family. Independent with ADL's , no DME usage prior to admit. States has no job, limited income. No health insurance.                  Roxanne Gates (Relative) [place] [phone]             YVO:PFYTW/KM CLINIC   [facility]  Action/Plan: Transition to home when medically ready... Pt without health insurance. States has problems affording medication. NCM spoke with pt regarding [facility]/ clinic's pharmacy to assist with medication issues and f/u.  [facility] brochure will be provided for reinforcement per NCM.  Pt states he is interested in going to clinic.   Pt will probably need Match Letter to assist with medication needs... NCM to provide prior to d/c. Pt can Rx medications and have filled @ [facility] pharmacy.  Pt states has transportation to home.  Expected Discharge Date:                  Expected Discharge Plan:  Home/Self Care  In-House Referral:  NA  Discharge planning Services  CM Consult  Post Acute Care Choice:    Choice offered to:  NA  DME Arranged:  N/A DME Agency:  NA  HH Arranged: OXYGEN HH Agency:  [facility], pending charity approval. Once approved portable oxygen tank will be delivered to bedside for transportation to home.  Status of Service:    completed  If discussed at Long Length of Stay Meetings, dates discussed:    Additional Comments: 02/28/2018 NCM faxed Rx meds to [facility] pharmacy for filling and pt pick.    Whitman Hero [place], RN 02/27/2018, 12:38 PM

## 2018-02-27 NOTE — Progress Notes (Signed)
ANTICOAGULATION CONSULT NOTE - Initial Consult  Pharmacy Consult for Warfarin Indication: atrial fibrillation  Allergies  Allergen Reactions  . No Known Allergies     Patient Measurements: Height: 5\' 10"  (177.8 cm) Weight: 252 lb 3.3 oz (114.4 kg) IBW/kg (Calculated) : 73  Vital Signs: Temp: 98.3 F (36.8 C) (12/18 0803) Temp Source: Oral (12/18 0803) BP: 111/69 (12/18 0607) Pulse Rate: 79 (12/18 0607)  Labs: Recent Labs    02/25/18 1235 02/25/18 1604 02/25/18 1625 02/25/18 2027 02/26/18 0050 02/26/18 0358 02/27/18 0332  HGB 15.2  --   --   --   --  13.9 13.6  HCT 49.9  --   --   --   --  42.2 40.9  PLT 204  --   --   --   --  195 178  APTT  --   --  42*  --   --   --   --   LABPROT  --   --  25.4*  --   --  30.1* 31.1*  INR  --   --  2.35  --   --  2.92 3.05  CREATININE 4.86* 4.80*  --  4.65* 3.85* 3.67* 3.37*  CKTOTAL  --   --   --  818*  --   --   --   TROPONINI  --  0.10*  --  0.14*  --  0.15*  --     Estimated Creatinine Clearance: 34.3 mL/min (A) (by C-G formula based on SCr of 3.37 mg/dL (H)).   Medical History: Past Medical History:  Diagnosis Date  . AICD (automatic cardioverter/defibrillator) present   . Atrial flutter ([place])    a. s/p ablation 03/2016  . Chronic systolic CHF (congestive heart failure) ([place])   . Essential hypertension 12/26/2005   Qualifier: Diagnosis of  By: Jerilee Hoh MD, Olam Idler    . History of gout   . Hyperlipidemia   . Hypertension   . Myocardial infarction [facility])    "I think I had one a long long time ago" (03/28/2016)  . NICM (nonischemic cardiomyopathy) ([place])    a. LHC 6/06: pLAD 20, pLCx 20-30; b. Echo 5/15:  EF 15%, diffuse HK, restrictive physiology, trivial AI, trivial MR, mild LAE, moderate RVE, moderately reduced RVSF, moderate RAE, mild to moderate TR, PASP 43 mmHg  . Obesity   . Persistent atrial fibrillation   . Type II diabetes mellitus ([facility])     Assessment: 2 YOM on warfarin PTA for hx Afib. Admit INR 2.35  on PTA dose. Pharmacy consulted to resume dosing this admission.  PTA dosing: 5mg  daily except 7.5mg  on MWF  INR 3.05 slightly supratherapeutic possibly d/t flagyl, CBC wnl.   Goal of Therapy:  INR 2-3 Monitor platelets by anticoagulation protocol: Yes   Plan:  Warfarin 5mg  PO x 1 tonight (will not give 7.5mg  d/t trend) Daily INR, s/s bleeding  Bertis Ruddy, PharmD Clinical Pharmacist Please check AMION for all [place] numbers 02/27/2018 8:20 AM

## 2018-02-27 NOTE — Progress Notes (Signed)
Pt refused to walk with nurse or nurse tech today. Nurse explained why we needed him to get up and walk , to check his oxygen levels while ambulating pt stated his toe hurt to bad to walk and maybe he would tomorrow  tomorrow

## 2018-02-28 DIAGNOSIS — E1169 Type 2 diabetes mellitus with other specified complication: Secondary | ICD-10-CM

## 2018-02-28 DIAGNOSIS — E785 Hyperlipidemia, unspecified: Secondary | ICD-10-CM

## 2018-02-28 DIAGNOSIS — I482 Chronic atrial fibrillation, unspecified: Secondary | ICD-10-CM

## 2018-02-28 LAB — CBC WITH DIFFERENTIAL/PLATELET
Abs Immature Granulocytes: 0.03 10*3/uL (ref 0.00–0.07)
Basophils Absolute: 0 10*3/uL (ref 0.0–0.1)
Basophils Relative: 0 %
Eosinophils Absolute: 0.4 10*3/uL (ref 0.0–0.5)
Eosinophils Relative: 6 %
HEMATOCRIT: 40.9 % (ref 39.0–52.0)
HEMOGLOBIN: 12.8 g/dL — AB (ref 13.0–17.0)
Immature Granulocytes: 1 %
Lymphocytes Relative: 13 %
Lymphs Abs: 0.8 10*3/uL (ref 0.7–4.0)
MCH: 29 pg (ref 26.0–34.0)
MCHC: 31.3 g/dL (ref 30.0–36.0)
MCV: 92.5 fL (ref 80.0–100.0)
MONOS PCT: 12 %
Monocytes Absolute: 0.7 10*3/uL (ref 0.1–1.0)
Neutro Abs: 4.2 10*3/uL (ref 1.7–7.7)
Neutrophils Relative %: 68 %
Platelets: 198 10*3/uL (ref 150–400)
RBC: 4.42 MIL/uL (ref 4.22–5.81)
RDW: 13.1 % (ref 11.5–15.5)
WBC: 6.1 10*3/uL (ref 4.0–10.5)
nRBC: 0 % (ref 0.0–0.2)

## 2018-02-28 LAB — CULTURE, BLOOD (ROUTINE X 2): Special Requests: ADEQUATE

## 2018-02-28 LAB — BASIC METABOLIC PANEL
ANION GAP: 10 (ref 5–15)
BUN: 38 mg/dL — ABNORMAL HIGH (ref 6–20)
CO2: 25 mmol/L (ref 22–32)
Calcium: 8.7 mg/dL — ABNORMAL LOW (ref 8.9–10.3)
Chloride: 102 mmol/L (ref 98–111)
Creatinine, Ser: 2.85 mg/dL — ABNORMAL HIGH (ref 0.61–1.24)
GFR calc Af Amer: 29 mL/min — ABNORMAL LOW (ref >60)
GFR calc non Af Amer: 25 mL/min — ABNORMAL LOW (ref >60)
Glucose, Bld: 262 mg/dL — ABNORMAL HIGH (ref 70–99)
POTASSIUM: 4.2 mmol/L (ref 3.5–5.1)
Sodium: 137 mmol/L (ref 135–145)

## 2018-02-28 LAB — GLUCOSE, CAPILLARY
GLUCOSE-CAPILLARY: 254 mg/dL — AB (ref 70–99)
Glucose-Capillary: 355 mg/dL — ABNORMAL HIGH (ref 70–99)
Glucose-Capillary: 234 mg/dL — ABNORMAL HIGH (ref 70–99)

## 2018-02-28 LAB — PROTIME-INR
INR: 3.39
Prothrombin Time: 33.7 seconds — ABNORMAL HIGH (ref 11.4–15.2)

## 2018-02-28 MED ORDER — COLCHICINE 0.6 MG PO TABS
0.6000 mg | ORAL_TABLET | Freq: Every day | ORAL | Status: DC
  Administered 2018-02-28: 0.6 mg via ORAL
  Filled 2018-02-28: qty 1

## 2018-02-28 MED ORDER — "INSULIN SYRINGE-NEEDLE U-100 25G X 1"" 1 ML MISC": 0 refills | Status: DC

## 2018-02-28 MED ORDER — INSULIN ASPART 100 UNIT/ML ~~LOC~~ SOLN: SUBCUTANEOUS | 0 refills | Status: DC

## 2018-02-28 MED ORDER — AMOXICILLIN-POT CLAVULANATE 875-125 MG PO TABS: 1.0000 | ORAL_TABLET | Freq: Two times a day (BID) | ORAL | 0 refills | Status: DC

## 2018-02-28 MED ORDER — INSULIN GLARGINE 100 UNIT/ML ~~LOC~~ SOLN: 25.0000 [IU] | Freq: Two times a day (BID) | SUBCUTANEOUS | 0 refills | Status: DC

## 2018-02-28 MED ORDER — DICLOFENAC SODIUM 1 % TD GEL: 2.0000 g | Freq: Four times a day (QID) | TRANSDERMAL | 0 refills | Status: DC

## 2018-02-28 MED ORDER — COLCHICINE 0.6 MG PO TABS: 0.6000 mg | ORAL_TABLET | Freq: Every day | ORAL | 0 refills | Status: DC

## 2018-02-28 MED ORDER — INSULIN GLARGINE 100 UNIT/ML ~~LOC~~ SOLN
30.0000 [IU] | Freq: Two times a day (BID) | SUBCUTANEOUS | Status: DC
  Administered 2018-02-28: 30 [IU] via SUBCUTANEOUS
  Filled 2018-02-28 (×2): qty 0.3

## 2018-02-28 MED ORDER — AMOXICILLIN-POT CLAVULANATE 875-125 MG PO TABS
1.0000 | ORAL_TABLET | Freq: Two times a day (BID) | ORAL | Status: DC
  Administered 2018-02-28: 1 via ORAL
  Filled 2018-02-28: qty 1

## 2018-02-28 MED ORDER — FUROSEMIDE 10 MG/ML IJ SOLN
40.0000 mg | Freq: Once | INTRAMUSCULAR | Status: AC
  Administered 2018-02-28: 40 mg via INTRAVENOUS
  Filled 2018-02-28: qty 4

## 2018-02-28 MED ORDER — BLOOD GLUCOSE MONITOR KIT: PACK | 1 refills | Status: DC

## 2018-02-28 MED ORDER — WARFARIN SODIUM 5 MG PO TABS: 5.0000 mg | ORAL_TABLET | Freq: Every day | ORAL | Status: DC

## 2018-02-28 MED FILL — !LANTUS 100 UNITS/ML VIAL: 100 | 20 days supply | Qty: 10 | Fill #0

## 2018-02-28 MED FILL — TRUEPLUS SYR 1ML 30GX5/16": 30G X 5/16" | 25 days supply | Qty: 100 | Fill #0

## 2018-02-28 MED FILL — !NOVOLOG 100UNITS/ML VIAL: 100/ML | 33 days supply | Qty: 10 | Fill #0

## 2018-02-28 MED FILL — !TRUE METRIX BLOOD GLUCOSE: 1 days supply | Qty: 1 | Fill #0

## 2018-02-28 MED FILL — !COLCRYS 0.6 MG TABLET: 0.6 MG | 15 days supply | Qty: 15 | Fill #0

## 2018-02-28 MED FILL — TRUEPLUS SYR 1ML 30GX5/16: 30G X 5/16" | 25 days supply | Qty: 100 | Fill #0

## 2018-02-28 MED FILL — AMOX-CLAV 875-125 MG TABLET: 875-125 | 10 days supply | Qty: 20 | Fill #0

## 2018-02-28 MED FILL — TRUEplus LANCETS 28G MISC: 25 days supply | Qty: 100 | Fill #0

## 2018-02-28 MED FILL — TRUE METRIX TEST STRIP: 25 days supply | Qty: 100 | Fill #0

## 2018-02-28 NOTE — Plan of Care (Signed)
  Problem: Clinical Measurements: Goal: Ability to maintain clinical measurements within normal limits will improve Outcome: Progressing   Problem: Nutrition: Goal: Adequate nutrition will be maintained Outcome: Progressing   Problem: Safety: Goal: Ability to remain free from injury will improve Outcome: Progressing   Problem: Health Behavior/Discharge Planning: Goal: Ability to identify and utilize available resources and services will improve Outcome: Progressing   Problem: Skin Integrity: Goal: Risk for impaired skin integrity will decrease Outcome: Progressing

## 2018-02-28 NOTE — Progress Notes (Signed)
Inpatient Diabetes Program Recommendations  AACE/ADA: New Consensus Statement on Inpatient Glycemic Control (2015)  Target Ranges:  Prepandial:   less than 140 mg/dL      Peak postprandial:   less than 180 mg/dL (1-2 hours)      Critically ill patients:  140 - 180 mg/dL   Lab Results  Component Value Date   GLUCAP 254 (H) 02/28/2018   HGBA1C 13.3 (H) 02/27/2018    Review of Glycemic Control Results for HERMES, WAFER (MRN [id number]) as of 02/28/2018 13:45  Ref. Range 02/27/2018 20:55 02/27/2018 22:50 02/28/2018 08:03 02/28/2018 12:16  Glucose-Capillary Latest Ref Range: 70 - 99 mg/dL 355 (H) 281 (H) 234 (H) 254 (H)   Diabetes history: Type 2 DM Admit with:DKA/ Sepsis/ Pneumonia/ Glucose 994 mg/dl on admission  Home DM Meds:Lantus 10 units QHS (NOT taking) Current Orders:Novolog 0-15 units TID, Novolog 0-5 units QHS, Glipizide 5 mg BID, Lantus 30 units BID  Inpatient Diabetes Program Recommendations:    Spoke with patient regarding diabetes management.   Reviewed patient's current A1c of 13.3%. Explained what a A1c is and what it measures. Also reviewed goal A1c with patient, importance of good glucose control @ home, and blood sugar goals. Reviewed patho of DM, need for insulin, role of pancreas, differences between long acting insulin versus short acting insulin, survival skills and interventions, vascular changes and comorbidites.   Encouraged to begin checking CBGs 2-3 times per day and reporting these values to next PCP visit. Plan for [facility]&W. Reviewed dosing scheduled, when to check CBGs, and when to call MD.   Educated patient on insulin pen use at home. Reviewed contents of insulin flexpen starter kit. Reviewed all steps if insulin pen including attachment of needle, 2-unit air shot, dialing up dose, giving injection, removing needle, disposal of sharps, storage of unused insulin, disposal of insulin etc. Patient able to provide successful return demonstration. Also  reviewed troubleshooting with insulin pen. MD to give patient Rxs for insulin pens and insulin pen needles.  Patient has no further questions at this time.  Thanks, Bronson Curb, MSN, RNC-OB Diabetes Coordinator [phone] (8a-5p)

## 2018-02-28 NOTE — Progress Notes (Signed)
SATURATION QUALIFICATIONS: (This note is used to comply with regulatory documentation for home oxygen)  Patient Saturations on Room Air at Rest = 79%  Patient Saturations on  2 Liters of oxygen while Ambulating = 88%  Please briefly explain why patient needs home oxygen:

## 2018-02-28 NOTE — Progress Notes (Signed)
Pharmacy Antibiotic Note  Travis Bryan is a 47 y.o. male admitted on 02/25/2018 with pneumonia.  Pharmacy has been consulted to change from Rocephin to Augmentin.  Plan: Augmentin 875mg  PO BID.  Height: 5\' 10"  (177.8 cm) Weight: 252 lb 3.3 oz (114.4 kg) IBW/kg (Calculated) : 73  Temp (24hrs), Avg:98.3 F (36.8 C), Min:97.9 F (36.6 C), Max:98.6 F (37 C)  Recent Labs  Lab 02/25/18 1235 02/25/18 1239 02/25/18 1601  02/25/18 2027 02/26/18 0050 02/26/18 0358 02/27/18 0332 02/28/18 0414  WBC 16.9*  --   --   --   --   --  20.6* 9.2 6.1  CREATININE 4.86*  --   --    < > 4.65* 3.85* 3.67* 3.37* 2.85*  LATICACIDVEN  --  2.28* 0.87  --   --   --   --   --   --    < > = values in this interval not displayed.    Estimated Creatinine Clearance: 40.6 mL/min (A) (by C-G formula based on SCr of 2.85 mg/dL (H)).    Allergies  Allergen Reactions  . No Known Allergies     Thank you for allowing pharmacy to be a part of this patient's care.  Wynona Neat, PharmD, BCPS  02/28/2018 6:30 AM

## 2018-02-28 NOTE — Discharge Summary (Signed)
[name] SWH:[id number] DOB: 1970-07-22 DOA: 02/25/2018  PCP: Carroll Sage, MD  Admit date: 02/25/2018  Discharge date: 02/28/2018  Admitted From: [facility]  Disposition:  [facility]   Recommendations for Outpatient Follow-up:   Follow up with PCP in 1-2 weeks  PCP Please obtain BMP/CBC, 2 view CXR in 1week,  (see Discharge instructions)   PCP Please follow up on the following pending results:    Home Health: NONE   Equipment/Devices: NONE  Consultations: nONE Discharge Condition: STABLE   CODE STATUS: FULL   Diet Recommendation: Heart Healthy Low Carb, 1.5 lit/day fluid restriction  Diet Order            Diet Carb Modified Fluid consistency: Thin; Room service appropriate? Yes  Diet effective now               Chief Complaint  Patient presents with  . Fever  . Chills  . Nausea     Brief history of present illness from the day of admission and additional interim summary    Rui Wordell a 47 y.o.malewith medical history significant ofAICD, atrial flutter, chronic systolic CHF with EF 25 to 30%, CAD, history of MI, AICD placement, persistent atrial fibrillation with chads 2 VASC score of 4, essential hypertension, gout, hyperlipidemia, obesity, type 2 diabetes who is coming to the emergency department with complaints of progressively worse fever with chills, nausea associated with chills, fatigue and malaise. He has also been coughing with production of yellowish sputum.  In the ER he was diagnosed with pneumonia, DKA with newly diagnosed diabetes mellitus type 2 and admitted to the hospital.                                                                 Hospital Course   1.  DKA in a patient with newly diagnosed DM type II.  Noted A1c, has been treated with DKA protocol, gap has closed, has  been placed on twice daily Lantus along with sliding scale, due to underlying CKD 5 avoiding Glucophage, insulin and diabetic education provided, testing supplies and insulin given, requested to do Accu-Cheks with [facility], write down on a notebook and show it to PCP in a week.  Lab Results  Component Value Date   HGBA1C 13.3 (H) 02/27/2018    2. Strep Pneum CAP with Bacteremia and Sepsis.  On IV Rocpehin and seems to have defervesced, follow final sensitivities -be discharged on 10 more days of oral Augmentin discussed with ID Dr. Drucilla Schmidt.   He is clinically much better but mildly short of breath, recovering from pneumonia may require oxygen in the short-term.  3.  Hypertension.  Currently on combination of Coreg, hydralazine and Norvasc.  Resume home dose diuretics now.  4.  Chronic atrial fibrillation.  Mali vas 2 score of  5.  On oral amiodarone, Coreg along with Coumadin.  Pharmacy monitoring INR.  5.  History of CAD.  Borderline troponin which is flat and and non-ACS pattern.  Continue Coreg, Imdur, Coumadin, statin, Ranexa.  Currently chest pain-free however EKG shows currently changed bundle branch block pattern.  Present echo noted which shows improved EF of 25%, requested to follow with cardiology outpatient home medications continued unchanged.  6.  Chronic systolic CHF.  Last known EF 20%.  Has AICD.  Was dehydrated and needed IVF here, now stable, resumed home Meds.  Noted new echocardiogram with improved EF and less than before wall motion abnormalities.  Continue home medications and follow with cardiology in a week.  7.  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  Baseline creatinine is close to 2.7. have hydrated, improved, resume home diuretics.  8.  Dyslipidemia.  On statin.  9.  Gout.  On allopurinol, some off-and-on left big toe pain, will add colchicine for a month, exam is unremarkable.    Discharge diagnosis     Principal Problem:   DKA (diabetic ketoacidosis) ([facility]) Active Problems:    Hypertension associated with diabetes ([facility])   Chronic systolic CHF (congestive heart failure) (HCC)   Hyperlipidemia   Atrial fibrillation (H[facility])   CAD (coronary artery disease)   Elevated troponin   Sepsis due to pneumonia ([place])   AKI (acute kidney injury) [facility])    Discharge instructions    Discharge Instructions    Discharge instructions   Complete by:  As directed    Follow with Primary MD Carroll Sage, MD in 7 days   Get CBC, CMP, INR,  2 view Chest X ray -  checked  by Primary MD in 5-7 days   Activity: As tolerated with Full fall precautions use walker/cane & assistance as needed  Disposition Home   Diet: Heart Healthy Low Carb, 1.5lit/day fluid restriction  Accuchecks 4 times/day, Once in AM empty stomach and then before each meal. Log in all results and show them to your Prim.MD in 3 days. If any glucose reading is under 80 or above 300 call your Prim MD immidiately. Follow Low glucose instructions for glucose under 80 as instructed.  Check your Weight same time everyday, if you gain over 2 pounds, or you develop in leg swelling, experience more shortness of breath or chest pain, call your Primary MD immediately. Follow Cardiac Low Salt Diet and 1.5 lit/day fluid restriction.  Special Instructions: If you have smoked or chewed Tobacco  in the last 2 yrs please stop smoking, stop any regular Alcohol  and or any Recreational drug use.  On your next visit with your primary care physician please Get Medicines reviewed and adjusted.  Please request your Prim.MD to go over all Hospital Tests and Procedure/Radiological results at the follow up, please get all Hospital records sent to your Prim MD by signing hospital release before you go home.  If you experience worsening of your admission symptoms, develop shortness of breath, life threatening emergency, suicidal or homicidal thoughts you must seek medical attention immediately by calling 911 or calling your MD immediately   if symptoms less severe.  You Must read complete instructions/literature along with all the possible adverse reactions/side effects for all the Medicines you take and that have been prescribed to you. Take any new Medicines after you have completely understood and accpet all the possible adverse reactions/side effects.   Increase activity slowly   Complete by:  As directed  Discharge Medications   Allergies as of 02/28/2018      Reactions   No Known Allergies       Medication List    TAKE these medications   allopurinol 300 MG tablet Commonly known as:  ZYLOPRIM Take 300 mg by mouth daily.   amiodarone 200 MG tablet Commonly known as:  PACERONE Take 1 tablet (200 mg total) by mouth daily.   amLODipine 5 MG tablet Commonly known as:  NORVASC Take 1 tablet (5 mg total) by mouth daily.   amoxicillin-clavulanate 875-125 MG tablet Commonly known as:  AUGMENTIN Take 1 tablet by mouth every 12 (twelve) hours.   atorvastatin 40 MG tablet Commonly known as:  LIPITOR Take 1 tablet (40 mg total) by mouth daily.   blood glucose meter kit and supplies Kit Dispense based on patient and insurance preference. Use up to four times daily as directed. (FOR ICD-9 250.00, 250.01). For QAC - HS accuchecks.   calcitRIOL 0.25 MCG capsule Commonly known as:  ROCALTROL Take 0.25 mcg by mouth daily.   carvedilol 25 MG tablet Commonly known as:  COREG TAKE 1 TABLET BY MOUTH TWICE A DAY WITH A MEAL. What changed:  See the new instructions.   colchicine 0.6 MG tablet Take 1 tablet (0.6 mg total) by mouth daily.   diclofenac sodium 1 % Gel Commonly known as:  VOLTAREN Apply 2 g topically 4 (four) times daily.   hydrALAZINE 100 MG tablet Commonly known as:  APRESOLINE TAKE 1 TABLET BY MOUTH 3 TIMES DAILY.   insulin aspart 100 UNIT/ML injection Commonly known as:  NOVOLOG Substitute to any brand approved.Before each meal 3 times a day, 140-199 - 2 units, 200-250 - 4 units, 251-299  - 6 units,  300-349 - 8 units,  350 or above 10 units. Dispense syringes and needles as needed, Ok to switch to PEN if approved. DX DM2, Code E11.65   insulin glargine 100 UNIT/ML injection Commonly known as:  LANTUS Inject 0.25 mLs (25 Units total) into the skin 2 (two) times daily. Dispense insulin pen if approved, if not dispense as needed syringes and needles for 1 month supply. Can switch to Levemir. Diagnosis E 11.65. What changed:    how much to take  when to take this  additional instructions   Insulin Syringe-Needle U-100 31G X 15/64" 0.3 ML Misc Use to inject insulin 4 times a day What changed:  Another medication with the same name was added. Make sure you understand how and when to take each.   Insulin Syringe-Needle U-100 25G X 1" 1 ML Misc For 4 times a day insulin SQ, 1 month supply. Diagnosis E11.65 What changed:  You were already taking a medication with the same name, and this prescription was added. Make sure you understand how and when to take each.   isosorbide mononitrate 30 MG 24 hr tablet Commonly known as:  IMDUR TAKE 3 TABLETS (90 MG TOTAL) BY MOUTH DAILY.   magnesium oxide 400 (241.3 Mg) MG tablet Commonly known as:  MAG-OX Take 1 tablet (400 mg total) by mouth daily.   metolazone 2.5 MG tablet Commonly known as:  ZAROXOLYN TAKE 1 TABLET BY MOUTH AS NEEDED AS DIRECTED What changed:  See the new instructions.   potassium chloride SA 20 MEQ tablet Commonly known as:  K-DUR,KLOR-CON TAKE 2 TABLETS BY MOUTH IN THE MORNING AND 1 TABLET IN THE EVENING   ranolazine 500 MG 12 hr tablet Commonly known as:  RANEXA TAKE 1 TABLET (  500 MG TOTAL) BY MOUTH 2 (TWO) TIMES DAILY.   torsemide 20 MG tablet Commonly known as:  DEMADEX Take 3 tablets (60 mg total) by mouth daily.   warfarin 5 MG tablet Commonly known as:  COUMADIN Take as directed. If you are unsure how to take this medication, talk to your nurse or doctor. Original instructions:  Take 1 tablet  (5 mg total) by mouth daily at 6 PM. Take As Directed by Coumadin Clinic Start taking on:  March 01, 2018 What changed:  See the new instructions.            Durable Medical Equipment  (From admission, onward)         Start     Ordered   02/28/18 0847  For home use only DME oxygen  Once    Question Answer Comment  Mode or (Route) Nasal cannula   Liters per Minute 2   Frequency Continuous (stationary and portable oxygen unit needed)   Oxygen conserving device Yes   Oxygen delivery system Gas      02/28/18 0846          Follow-up Information    [place]. Go on 03/15/2018.   Why:  10 am Contact information: [place] [id number] [phone]       Jerline Pain, MD. Schedule an appointment as soon as possible for a visit in 1 day(s).   Specialty:  Cardiology Why:  CHF, CAD Contact information: [address] [place] [address] [place] [address] [phone]           Major procedures and Radiology Reports - PLEASE review detailed and final reports thoroughly  -     TTE  - Left ventricle: Diffuse hypokinesis worse in the inferior wall.  The cavity size was moderately dilated. Wall thickness was increased in a pattern of mild LVH. Systolic function was  severely reduced. The estimated ejection fraction was in the range of 25% to 30%. Doppler parameters are consistent with both   elevated ventricular end-diastolic filling pressure and elevated left atrial filling pressure. - Left atrium: The atrium was mildly dilated. - Atrial septum: No defect or patent foramen ovale was identified.   Dg Chest 2 View  Result Date: 02/26/2018 CLINICAL DATA:  Productive cough. EXAM: CHEST - 2 VIEW COMPARISON:  February 25, 2018 FINDINGS: Cardiomegaly. Stable AICD device. Patchy bilateral infiltrates, stable on the left and increased on the right in the interval. No other acute abnormalities. IMPRESSION: 1.  Patchy bilateral pulmonary infiltrates, stable on the left and mildly increased on the right in the interval could represent a multifocal pneumonia versus asymmetric edema. Recommend clinical correlation. Electronically Signed   By: Dorise Bullion III M.D   On: 02/26/2018 13:42   Dg Chest Portable 1 View  Result Date: 02/25/2018 CLINICAL DATA:  Shortness of breath. EXAM: PORTABLE CHEST 1 VIEW COMPARISON:  04/01/2016 FINDINGS: Pacemaker/AICD in place. Chronic cardiomegaly. The right lung is clear. There is abnormal density in the left lower lung that could be due to pneumonia or a posterior left effusion. IMPRESSION: Question left lower lobe pneumonia versus left effusion. Electronically Signed   By: Nelson Chimes M.D.   On: 02/25/2018 13:01    Micro Results    Recent Results (from the past 240 hour(s))  Blood Culture (routine x 2)     Status: None (Preliminary result)   Collection Time: 02/25/18 12:33 PM  Result Value Ref Range Status  Specimen Description BLOOD RIGHT ANTECUBITAL  Final   Special Requests   Final    BOTTLES DRAWN AEROBIC AND ANAEROBIC Blood Culture adequate volume   Culture   Final    NO GROWTH 3 DAYS Performed at [facility], [address], [place], [place] [address]    Report Status PENDING  Incomplete  Blood Culture (routine x 2)     Status: Abnormal   Collection Time: 02/25/18  5:05 PM  Result Value Ref Range Status   Specimen Description BLOOD RIGHT HAND  Final   Special Requests   Final    BOTTLES DRAWN AEROBIC AND ANAEROBIC Blood Culture adequate volume   Culture  Setup Time   Final    GRAM POSITIVE COCCI IN CHAINS AEROBIC BOTTLE ONLY Organism ID to follow CRITICAL RESULT CALLED TO, READ BACK BY AND VERIFIED WITH: Karlene Einstein PharmD 11:00 02/26/18 ([email or web address]) Performed at [facility], [place] [address], [place], [place] [address]    Culture STREPTOCOCCUS PNEUMONIAE (A)  Final   Report Status 02/28/2018 FINAL  Final   Organism ID, Bacteria  STREPTOCOCCUS PNEUMONIAE  Final      Susceptibility   Streptococcus pneumoniae - MIC*    ERYTHROMYCIN <=0.12 SENSITIVE Sensitive     LEVOFLOXACIN 0.5 SENSITIVE Sensitive     VANCOMYCIN 0.5 SENSITIVE Sensitive     PENICILLIN (meningitis) <=0.06 SENSITIVE Sensitive     PENICILLIN (non-meningitis) <=0.06 SENSITIVE Sensitive     PENICILLIN (oral) <=0.06 SENSITIVE Sensitive     CEFTRIAXONE (non-meningitis) <=0.12 SENSITIVE Sensitive     CEFTRIAXONE (meningitis) <=0.12 SENSITIVE Sensitive     * STREPTOCOCCUS PNEUMONIAE  Blood Culture ID Panel (Reflexed)     Status: Abnormal   Collection Time: 02/25/18  5:05 PM  Result Value Ref Range Status   Enterococcus species NOT DETECTED NOT DETECTED Final   Listeria monocytogenes NOT DETECTED NOT DETECTED Final   Staphylococcus species NOT DETECTED NOT DETECTED Final   Staphylococcus aureus (BCID) NOT DETECTED NOT DETECTED Final   Streptococcus species DETECTED (A) NOT DETECTED Final    Comment: CRITICAL RESULT CALLED TO, READ BACK BY AND VERIFIED WITH: Karlene Einstein PharmD 11:00 02/26/18 ([email or web address])    Streptococcus agalactiae NOT DETECTED NOT DETECTED Final   Streptococcus pneumoniae DETECTED (A) NOT DETECTED Final    Comment: CRITICAL RESULT CALLED TO, READ BACK BY AND VERIFIED WITH: Karlene Einstein PharmD 11:00 02/26/18 ([email or web address])    Streptococcus pyogenes NOT DETECTED NOT DETECTED Final   Acinetobacter baumannii NOT DETECTED NOT DETECTED Final   Enterobacteriaceae species NOT DETECTED NOT DETECTED Final   Enterobacter cloacae complex NOT DETECTED NOT DETECTED Final   Escherichia coli NOT DETECTED NOT DETECTED Final   Klebsiella oxytoca NOT DETECTED NOT DETECTED Final   Klebsiella pneumoniae NOT DETECTED NOT DETECTED Final   Proteus species NOT DETECTED NOT DETECTED Final   Serratia marcescens NOT DETECTED NOT DETECTED Final   Haemophilus influenzae NOT DETECTED NOT DETECTED Final   Neisseria meningitidis NOT DETECTED NOT DETECTED Final    Pseudomonas aeruginosa NOT DETECTED NOT DETECTED Final   Candida albicans NOT DETECTED NOT DETECTED Final   Candida glabrata NOT DETECTED NOT DETECTED Final   Candida krusei NOT DETECTED NOT DETECTED Final   Candida parapsilosis NOT DETECTED NOT DETECTED Final   Candida tropicalis NOT DETECTED NOT DETECTED Final    Comment: Performed at [facility], [address] [place], [place] [address]  MRSA PCR Screening     Status: None   Collection Time: 02/25/18  10:44 PM  Result Value Ref Range Status   MRSA by PCR NEGATIVE NEGATIVE Final    Comment:        The GeneXpert MRSA Assay (FDA approved for NASAL specimens only), is one component of a comprehensive MRSA colonization surveillance program. It is not intended to diagnose MRSA infection nor to guide or monitor treatment for MRSA infections. Performed at [facility], [place] [address] [place], [place] [address]     Today   Subjective    Malakai Schoenherr today has no headache,no chest abdominal pain,no new weakness tingling or numbness, feels much better wants to go home today.     Objective   Blood pressure 128/67, pulse 69, temperature 98.6 F (37 C), temperature source Oral, resp. rate 17, height '5\' 10"'  (1.778 m), weight 114.4 kg, SpO2 (!) 88 %.   Intake/Output Summary (Last 24 hours) at 02/28/2018 0853 Last data filed at 02/28/2018 0432 Gross per 24 hour  Intake -  Output 550 ml  Net -550 ml    Exam  Awake Alert, Oriented x 3, No new F.N deficits, Normal affect [place].AT,PERRAL Supple Neck,No JVD, No cervical lymphadenopathy appriciated.  Symmetrical Chest wall movement, Good air movement bilaterally, CTAB RRR,No Gallops,Rubs or new Murmurs, No Parasternal Heave +ve B.Sounds, Abd Soft, Non tender, No organomegaly appriciated, No rebound -guarding or rigidity. No Cyanosis, Clubbing or edema, No new Rash or bruise   Data Review   CBC w Diff:  Lab Results  Component Value Date   WBC 6.1 02/28/2018   HGB  12.8 (L) 02/28/2018   HCT 40.9 02/28/2018   PLT 198 02/28/2018   LYMPHOPCT 13 02/28/2018   MONOPCT 12 02/28/2018   EOSPCT 6 02/28/2018   BASOPCT 0 02/28/2018    CMP:  Lab Results  Component Value Date   NA 137 02/28/2018   NA 141 04/20/2016   K 4.2 02/28/2018   CL 102 02/28/2018   CO2 25 02/28/2018   BUN 38 (H) 02/28/2018   BUN 43 (H) 04/20/2016   CREATININE 2.85 (H) 02/28/2018   CREATININE 1.46 (H) 03/16/2015   PROT 7.2 02/26/2018   ALBUMIN 2.7 (L) 02/26/2018   BILITOT 1.2 02/26/2018   ALKPHOS 113 02/26/2018   AST 29 02/26/2018   ALT 21 02/26/2018  .   Total Time in preparing paper work, data evaluation and todays exam - 62 minutes  Lala Lund M.D on 02/28/2018 at 8:53 AM  [facility]   Office  [phone]

## 2018-02-28 NOTE — Plan of Care (Signed)
Nsg Discharge Note  Admit Date:  02/25/2018 Discharge date: 02/28/2018   Travis Bryan to be D/C'd Home per MD order.  AVS completed.  Copy for chart, and copy for patient signed, and dated. Patient/caregiver able to verbalize understanding.  Discharge Medication: Allergies as of 02/28/2018       Reactions   No Known Allergies         Medication List     TAKE these medications    allopurinol 300 MG tablet Commonly known as:  ZYLOPRIM Take 300 mg by mouth daily.   amiodarone 200 MG tablet Commonly known as:  PACERONE Take 1 tablet (200 mg total) by mouth daily.   amLODipine 5 MG tablet Commonly known as:  NORVASC Take 1 tablet (5 mg total) by mouth daily.   amoxicillin-clavulanate 875-125 MG tablet Commonly known as:  AUGMENTIN Take 1 tablet by mouth every 12 (twelve) hours.   atorvastatin 40 MG tablet Commonly known as:  LIPITOR Take 1 tablet (40 mg total) by mouth daily.   blood glucose meter kit and supplies Kit Dispense based on patient and insurance preference. Use up to four times daily as directed. (FOR ICD-9 250.00, 250.01). For QAC - HS accuchecks.   calcitRIOL 0.25 MCG capsule Commonly known as:  ROCALTROL Take 0.25 mcg by mouth daily.   carvedilol 25 MG tablet Commonly known as:  COREG TAKE 1 TABLET BY MOUTH TWICE A DAY WITH A MEAL. What changed:  See the new instructions.   colchicine 0.6 MG tablet Take 1 tablet (0.6 mg total) by mouth daily.   diclofenac sodium 1 % Gel Commonly known as:  VOLTAREN Apply 2 g topically 4 (four) times daily.   hydrALAZINE 100 MG tablet Commonly known as:  APRESOLINE TAKE 1 TABLET BY MOUTH 3 TIMES DAILY.   insulin aspart 100 UNIT/ML injection Commonly known as:  NOVOLOG Substitute to any brand approved.Before each meal 3 times a day, 140-199 - 2 units, 200-250 - 4 units, 251-299 - 6 units,  300-349 - 8 units,  350 or above 10 units. Dispense syringes and needles as needed, Ok to switch to PEN if approved. DX  DM2, Code E11.65   insulin glargine 100 UNIT/ML injection Commonly known as:  LANTUS Inject 0.25 mLs (25 Units total) into the skin 2 (two) times daily. Dispense insulin pen if approved, if not dispense as needed syringes and needles for 1 month supply. Can switch to Levemir. Diagnosis E 11.65. What changed:   how much to take when to take this additional instructions   Insulin Syringe-Needle U-100 31G X 15/64" 0.3 ML Misc Use to inject insulin 4 times a day What changed:  Another medication with the same name was added. Make sure you understand how and when to take each.   Insulin Syringe-Needle U-100 25G X 1" 1 ML Misc For 4 times a day insulin SQ, 1 month supply. Diagnosis E11.65 What changed:  You were already taking a medication with the same name, and this prescription was added. Make sure you understand how and when to take each.   isosorbide mononitrate 30 MG 24 hr tablet Commonly known as:  IMDUR TAKE 3 TABLETS (90 MG TOTAL) BY MOUTH DAILY.   magnesium oxide 400 (241.3 Mg) MG tablet Commonly known as:  MAG-OX Take 1 tablet (400 mg total) by mouth daily.   metolazone 2.5 MG tablet Commonly known as:  ZAROXOLYN TAKE 1 TABLET BY MOUTH AS NEEDED AS DIRECTED What changed:  See the new instructions.  potassium chloride SA 20 MEQ tablet Commonly known as:  K-DUR,KLOR-CON TAKE 2 TABLETS BY MOUTH IN THE MORNING AND 1 TABLET IN THE EVENING   ranolazine 500 MG 12 hr tablet Commonly known as:  RANEXA TAKE 1 TABLET (500 MG TOTAL) BY MOUTH 2 (TWO) TIMES DAILY.   torsemide 20 MG tablet Commonly known as:  DEMADEX Take 3 tablets (60 mg total) by mouth daily.   warfarin 5 MG tablet Commonly known as:  COUMADIN Take as directed. If you are unsure how to take this medication, talk to your nurse or doctor. Original instructions:  Take 1 tablet (5 mg total) by mouth daily at 6 PM. Take As Directed by Coumadin Clinic Start taking on:  March 01, 2018 What changed:  See the new  instructions.               Durable Medical Equipment  (From admission, onward)           Start     Ordered   02/28/18 0847  For home use only DME oxygen  Once    Question Answer Comment  Mode or (Route) Nasal cannula   Liters per Minute 2   Frequency Continuous (stationary and portable oxygen unit needed)   Oxygen conserving device Yes   Oxygen delivery system Gas      02/28/18 0846            Discharge Assessment: Vitals:   02/28/18 0506 02/28/18 0844  BP: 128/67   Pulse: 69   Resp: 17   Temp: 98.6 F (37 C)   SpO2: 92% (!) 88%   Skin clean, dry and intact without evidence of skin break down, no evidence of skin tears noted. IV catheter discontinued intact. Site without signs and symptoms of complications - no redness or edema noted at insertion site, patient denies c/o pain - only slight tenderness at site.  Dressing with slight pressure applied.  D/c Instructions-Education: Discharge instructions given to patient/family with verbalized understanding. D/c education completed with patient/family including follow up instructions, medication list, d/c activities limitations if indicated, with other d/c instructions as indicated by MD - patient able to verbalize understanding, all questions fully answered. Patient instructed to return to ED, call 911, or call MD for any changes in condition. Will DC home when home O2 has been approved and received.  Patient will be escorted via WC, and D/C home via private auto.  Salley Slaughter, RN 02/28/2018 12:01 PM

## 2018-02-28 NOTE — Discharge Instructions (Signed)
Follow with Primary MD Carroll Sage, MD in 7 days   Get CBC, CMP, INR,  2 view Chest X ray -  checked  by Primary MD in 5-7 days   Activity: As tolerated with Full fall precautions use walker/cane & assistance as needed  Disposition Home   Diet: Heart Healthy Low Carb, 1.5lit/day fluid restriction  Accuchecks 4 times/day, Once in AM empty stomach and then before each meal. Log in all results and show them to your Prim.MD in 3 days. If any glucose reading is under 80 or above 300 call your Prim MD immidiately. Follow Low glucose instructions for glucose under 80 as instructed.  Check your Weight same time everyday, if you gain over 2 pounds, or you develop in leg swelling, experience more shortness of breath or chest pain, call your Primary MD immediately. Follow Cardiac Low Salt Diet and 1.5 lit/day fluid restriction.  Special Instructions: If you have smoked or chewed Tobacco  in the last 2 yrs please stop smoking, stop any regular Alcohol  and or any Recreational drug use.  On your next visit with your primary care physician please Get Medicines reviewed and adjusted.  Please request your Prim.MD to go over all Hospital Tests and Procedure/Radiological results at the follow up, please get all Hospital records sent to your Prim MD by signing hospital release before you go home.  If you experience worsening of your admission symptoms, develop shortness of breath, life threatening emergency, suicidal or homicidal thoughts you must seek medical attention immediately by calling 911 or calling your MD immediately  if symptoms less severe.  You Must read complete instructions/literature along with all the possible adverse reactions/side effects for all the Medicines you take and that have been prescribed to you. Take any new Medicines after you have completely understood and accpet all the possible adverse reactions/side effects.

## 2018-02-28 NOTE — Progress Notes (Signed)
ANTICOAGULATION CONSULT NOTE - Initial Consult  Pharmacy Consult for Warfarin Indication: atrial fibrillation  Allergies  Allergen Reactions  . No Known Allergies     Patient Measurements: Height: 5\' 10"  (177.8 cm) Weight: 252 lb 3.3 oz (114.4 kg) IBW/kg (Calculated) : 73  Vital Signs: Temp: 98.6 F (37 C) (12/19 0506) Temp Source: Oral (12/19 0506) BP: 128/67 (12/19 0506) Pulse Rate: 69 (12/19 0506)  Labs: Recent Labs    02/25/18 1604  02/25/18 1625 02/25/18 2027  02/26/18 0358 02/27/18 0332 02/28/18 0414  HGB  --   --   --   --   --  13.9 13.6 12.8*  HCT  --   --   --   --   --  42.2 40.9 40.9  PLT  --   --   --   --   --  195 178 198  APTT  --   --  42*  --   --   --   --   --   LABPROT  --    < > 25.4*  --   --  30.1* 31.1* 33.7*  INR  --    < > 2.35  --   --  2.92 3.05 3.39  CREATININE 4.80*  --   --  4.65*   < > 3.67* 3.37* 2.85*  CKTOTAL  --   --   --  818*  --   --   --   --   TROPONINI 0.10*  --   --  0.14*  --  0.15*  --   --    < > = values in this interval not displayed.    Estimated Creatinine Clearance: 40.6 mL/min (A) (by C-G formula based on SCr of 2.85 mg/dL (H)).   Medical History: Past Medical History:  Diagnosis Date  . AICD (automatic cardioverter/defibrillator) present   . Atrial flutter ([place])    a. s/p ablation 03/2016  . Chronic systolic CHF (congestive heart failure) ([place])   . Essential hypertension 12/26/2005   Qualifier: Diagnosis of  By: Jerilee Hoh MD, Olam Idler    . History of gout   . Hyperlipidemia   . Hypertension   . Myocardial infarction [facility])    "I think I had one a long long time ago" (03/28/2016)  . NICM (nonischemic cardiomyopathy) ([place])    a. LHC 6/06: pLAD 20, pLCx 20-30; b. Echo 5/15:  EF 15%, diffuse HK, restrictive physiology, trivial AI, trivial MR, mild LAE, moderate RVE, moderately reduced RVSF, moderate RAE, mild to moderate TR, PASP 43 mmHg  . Obesity   . Persistent atrial fibrillation   . Type II diabetes  mellitus (HCC)     Assessment: 64 YOM on warfarin PTA for hx Afib. Admit INR 2.35 on PTA dose. Pharmacy consulted to resume dosing this admission.  PTA dosing: 5mg  daily except 7.5mg  on MWF  INR supratherapeutic at 3.39, likely still seeing effects of Flagyl, has been on ~20% decreased dose from PTA, CBC stable and no bleeding reported.  Goal of Therapy:  INR 2-3 Monitor platelets by anticoagulation protocol: Yes   Plan:  Hold warfarin tonight Daily INR, s/s bleeding  Bertis Ruddy, PharmD Clinical Pharmacist Please check AMION for all [place] numbers 02/28/2018 9:31 AM

## 2018-03-01 LAB — CUP PACEART REMOTE DEVICE CHECK
Battery Remaining Longevity: 69 mo
Battery Remaining Percentage: 65 %
Battery Voltage: 2.95 V
Brady Statistic RV Percent Paced: 1 %
Date Time Interrogation Session: 20191009103103
HighPow Impedance: 53 Ohm
HighPow Impedance: 54 Ohm
Implantable Lead Implant Date: 20090211
Implantable Lead Location: 753860
Implantable Lead Model: 7121
Implantable Pulse Generator Implant Date: 20150617
Lead Channel Impedance Value: 330 Ohm
Lead Channel Pacing Threshold Amplitude: 1 V
Lead Channel Pacing Threshold Pulse Width: 0.5 ms
Lead Channel Sensing Intrinsic Amplitude: 10.7 mV
Lead Channel Setting Pacing Amplitude: 2.5 V
Lead Channel Setting Sensing Sensitivity: 0.5 mV
MDC IDC SET LEADCHNL RV PACING PULSEWIDTH: 0.5 ms
Pulse Gen Serial Number: 7200640

## 2018-03-02 LAB — CULTURE, BLOOD (ROUTINE X 2)
Culture: NO GROWTH
Special Requests: ADEQUATE

## 2018-03-04 ENCOUNTER — Telehealth: Payer: Self-pay

## 2018-03-04 MED FILL — RANOLAZINE ER 500 MG TB12: 500 | 7 days supply | Qty: 14 | Fill #3

## 2018-03-04 MED FILL — AMIODARONE HCL 200 MG TAB: 200 | 30 days supply | Qty: 30 | Fill #2

## 2018-03-04 MED FILL — WARFARIN SODIUM 5 MG TABLET: 5 | 30 days supply | Qty: 45 | Fill #1

## 2018-03-04 NOTE — Telephone Encounter (Signed)
Pt called to let us know that they were put on amoxclav wanted to clarify that it was safe to take with coumadin

## 2018-03-04 NOTE — Telephone Encounter (Signed)
Called to schedule appt and pt was compliant to come in 12/26

## 2018-03-07 ENCOUNTER — Ambulatory Visit (INDEPENDENT_AMBULATORY_CARE_PROVIDER_SITE_OTHER): Payer: Self-pay | Admitting: *Deleted

## 2018-03-07 DIAGNOSIS — Z5181 Encounter for therapeutic drug level monitoring: Secondary | ICD-10-CM

## 2018-03-07 DIAGNOSIS — I483 Typical atrial flutter: Secondary | ICD-10-CM

## 2018-03-07 DIAGNOSIS — I4819 Other persistent atrial fibrillation: Secondary | ICD-10-CM

## 2018-03-07 DIAGNOSIS — Z95810 Presence of automatic (implantable) cardiac defibrillator: Secondary | ICD-10-CM

## 2018-03-07 LAB — POCT INR: INR: 4.4 — AB (ref 2.0–3.0)

## 2018-03-07 NOTE — Patient Instructions (Signed)
Description   Do not take any Coumadin today and No Coumadin tomorrow then continue taking 1 tablet daily except 1.5 tablets on Mondays, Wednesdays, and Fridays.  Recheck in 2 weeks.  Coumadin Clinic [phone]. Do 2 servings of greens a week

## 2018-03-08 MED FILL — TORSEMIDE 20 MG TABLET: 20 | 30 days supply | Qty: 90 | Fill #2

## 2018-03-11 MED FILL — RANOLAZINE ER 500 MG TB12: 500 | 7 days supply | Qty: 14 | Fill #4

## 2018-03-15 ENCOUNTER — Inpatient Hospital Stay: Payer: Self-pay | Admitting: Critical Care Medicine

## 2018-03-18 MED FILL — hydrALAZINE HCL 100 MG TABS: 100 | 30 days supply | Qty: 90 | Fill #0

## 2018-03-18 MED FILL — !RANEXA ER 500 MG TABLET: 500 | 30 days supply | Qty: 60 | Fill #0

## 2018-03-20 ENCOUNTER — Ambulatory Visit (INDEPENDENT_AMBULATORY_CARE_PROVIDER_SITE_OTHER): Payer: Self-pay

## 2018-03-20 DIAGNOSIS — I428 Other cardiomyopathies: Secondary | ICD-10-CM

## 2018-03-20 DIAGNOSIS — I5022 Chronic systolic (congestive) heart failure: Secondary | ICD-10-CM

## 2018-03-21 LAB — CUP PACEART REMOTE DEVICE CHECK
Battery Remaining Longevity: 67 mo
Battery Remaining Percentage: 63 %
Battery Voltage: 2.95 V
Brady Statistic RV Percent Paced: 1 %
Date Time Interrogation Session: 20200108070016
HIGH POWER IMPEDANCE MEASURED VALUE: 51 Ohm
HighPow Impedance: 51 Ohm
Implantable Lead Implant Date: 20090211
Implantable Lead Location: 753860
Implantable Lead Model: 7121
Implantable Pulse Generator Implant Date: 20150617
Lead Channel Impedance Value: 340 Ohm
Lead Channel Pacing Threshold Amplitude: 1 V
Lead Channel Pacing Threshold Pulse Width: 0.5 ms
Lead Channel Setting Pacing Amplitude: 2.5 V
Lead Channel Setting Pacing Pulse Width: 0.5 ms
MDC IDC MSMT LEADCHNL RV SENSING INTR AMPL: 11.2 mV
MDC IDC SET LEADCHNL RV SENSING SENSITIVITY: 0.5 mV
Pulse Gen Serial Number: 7200640

## 2018-03-21 NOTE — Progress Notes (Signed)
Remote ICD transmission.   

## 2018-03-28 ENCOUNTER — Inpatient Hospital Stay (HOSPITAL_COMMUNITY)
Admission: EM | Admit: 2018-03-28 | Discharge: 2018-04-26 | DRG: 853 | Disposition: A | Discharge status: Rehabilitation Facility | Payer: Medicaid Other | Attending: Internal Medicine | Admitting: Internal Medicine

## 2018-03-28 ENCOUNTER — Emergency Department (HOSPITAL_COMMUNITY): Payer: Medicaid Other

## 2018-03-28 ENCOUNTER — Encounter (HOSPITAL_COMMUNITY): Payer: Self-pay | Admitting: General Practice

## 2018-03-28 ENCOUNTER — Other Ambulatory Visit: Payer: Self-pay

## 2018-03-28 DIAGNOSIS — G8929 Other chronic pain: Secondary | ICD-10-CM | POA: Diagnosis not present

## 2018-03-28 DIAGNOSIS — N179 Acute kidney failure, unspecified: Secondary | ICD-10-CM | POA: Diagnosis present

## 2018-03-28 DIAGNOSIS — R451 Restlessness and agitation: Secondary | ICD-10-CM | POA: Diagnosis not present

## 2018-03-28 DIAGNOSIS — N186 End stage renal disease: Secondary | ICD-10-CM | POA: Diagnosis present

## 2018-03-28 DIAGNOSIS — Z794 Long term (current) use of insulin: Secondary | ICD-10-CM | POA: Diagnosis not present

## 2018-03-28 DIAGNOSIS — J123 Human metapneumovirus pneumonia: Secondary | ICD-10-CM | POA: Diagnosis present

## 2018-03-28 DIAGNOSIS — I428 Other cardiomyopathies: Secondary | ICD-10-CM

## 2018-03-28 DIAGNOSIS — Z79899 Other long term (current) drug therapy: Secondary | ICD-10-CM

## 2018-03-28 DIAGNOSIS — Z87891 Personal history of nicotine dependence: Secondary | ICD-10-CM

## 2018-03-28 DIAGNOSIS — Z6836 Body mass index (BMI) 36.0-36.9, adult: Secondary | ICD-10-CM

## 2018-03-28 DIAGNOSIS — I5023 Acute on chronic systolic (congestive) heart failure: Secondary | ICD-10-CM | POA: Diagnosis not present

## 2018-03-28 DIAGNOSIS — J154 Pneumonia due to other streptococci: Secondary | ICD-10-CM | POA: Diagnosis present

## 2018-03-28 DIAGNOSIS — Z978 Presence of other specified devices: Secondary | ICD-10-CM

## 2018-03-28 DIAGNOSIS — E46 Unspecified protein-calorie malnutrition: Secondary | ICD-10-CM | POA: Diagnosis present

## 2018-03-28 DIAGNOSIS — K567 Ileus, unspecified: Secondary | ICD-10-CM | POA: Diagnosis not present

## 2018-03-28 DIAGNOSIS — N185 Chronic kidney disease, stage 5: Secondary | ICD-10-CM | POA: Diagnosis not present

## 2018-03-28 DIAGNOSIS — Z419 Encounter for procedure for purposes other than remedying health state, unspecified: Secondary | ICD-10-CM

## 2018-03-28 DIAGNOSIS — R5381 Other malaise: Secondary | ICD-10-CM | POA: Diagnosis not present

## 2018-03-28 DIAGNOSIS — I440 Atrioventricular block, first degree: Secondary | ICD-10-CM

## 2018-03-28 DIAGNOSIS — I4819 Other persistent atrial fibrillation: Secondary | ICD-10-CM | POA: Diagnosis present

## 2018-03-28 DIAGNOSIS — Z833 Family history of diabetes mellitus: Secondary | ICD-10-CM

## 2018-03-28 DIAGNOSIS — J9601 Acute respiratory failure with hypoxia: Secondary | ICD-10-CM

## 2018-03-28 DIAGNOSIS — R778 Other specified abnormalities of plasma proteins: Secondary | ICD-10-CM | POA: Diagnosis present

## 2018-03-28 DIAGNOSIS — J9600 Acute respiratory failure, unspecified whether with hypoxia or hypercapnia: Secondary | ICD-10-CM

## 2018-03-28 DIAGNOSIS — D6489 Other specified anemias: Secondary | ICD-10-CM | POA: Diagnosis present

## 2018-03-28 DIAGNOSIS — E785 Hyperlipidemia, unspecified: Secondary | ICD-10-CM | POA: Diagnosis present

## 2018-03-28 DIAGNOSIS — Z9889 Other specified postprocedural states: Secondary | ICD-10-CM

## 2018-03-28 DIAGNOSIS — J80 Acute respiratory distress syndrome: Secondary | ICD-10-CM | POA: Diagnosis present

## 2018-03-28 DIAGNOSIS — I152 Hypertension secondary to endocrine disorders: Secondary | ICD-10-CM | POA: Diagnosis present

## 2018-03-28 DIAGNOSIS — I480 Paroxysmal atrial fibrillation: Secondary | ICD-10-CM | POA: Diagnosis not present

## 2018-03-28 DIAGNOSIS — A409 Streptococcal sepsis, unspecified: Principal | ICD-10-CM | POA: Diagnosis present

## 2018-03-28 DIAGNOSIS — E1165 Type 2 diabetes mellitus with hyperglycemia: Secondary | ICD-10-CM | POA: Diagnosis present

## 2018-03-28 DIAGNOSIS — E1122 Type 2 diabetes mellitus with diabetic chronic kidney disease: Secondary | ICD-10-CM | POA: Diagnosis present

## 2018-03-28 DIAGNOSIS — R140 Abdominal distension (gaseous): Secondary | ICD-10-CM

## 2018-03-28 DIAGNOSIS — I130 Hypertensive heart and chronic kidney disease with heart failure and stage 1 through stage 4 chronic kidney disease, or unspecified chronic kidney disease: Secondary | ICD-10-CM

## 2018-03-28 DIAGNOSIS — J9690 Respiratory failure, unspecified, unspecified whether with hypoxia or hypercapnia: Secondary | ICD-10-CM

## 2018-03-28 DIAGNOSIS — T501X5A Adverse effect of loop [high-ceiling] diuretics, initial encounter: Secondary | ICD-10-CM | POA: Diagnosis present

## 2018-03-28 DIAGNOSIS — E11649 Type 2 diabetes mellitus with hypoglycemia without coma: Secondary | ICD-10-CM | POA: Diagnosis present

## 2018-03-28 DIAGNOSIS — E118 Type 2 diabetes mellitus with unspecified complications: Secondary | ICD-10-CM | POA: Diagnosis not present

## 2018-03-28 DIAGNOSIS — K0889 Other specified disorders of teeth and supporting structures: Secondary | ICD-10-CM

## 2018-03-28 DIAGNOSIS — Z01810 Encounter for preprocedural cardiovascular examination: Secondary | ICD-10-CM | POA: Diagnosis not present

## 2018-03-28 DIAGNOSIS — Z4659 Encounter for fitting and adjustment of other gastrointestinal appliance and device: Secondary | ICD-10-CM

## 2018-03-28 DIAGNOSIS — I2510 Atherosclerotic heart disease of native coronary artery without angina pectoris: Secondary | ICD-10-CM | POA: Diagnosis not present

## 2018-03-28 DIAGNOSIS — R6521 Severe sepsis with septic shock: Secondary | ICD-10-CM | POA: Diagnosis present

## 2018-03-28 DIAGNOSIS — L89322 Pressure ulcer of left buttock, stage 2: Secondary | ICD-10-CM | POA: Diagnosis present

## 2018-03-28 DIAGNOSIS — I5043 Acute on chronic combined systolic (congestive) and diastolic (congestive) heart failure: Secondary | ICD-10-CM | POA: Diagnosis present

## 2018-03-28 DIAGNOSIS — L89152 Pressure ulcer of sacral region, stage 2: Secondary | ICD-10-CM | POA: Diagnosis present

## 2018-03-28 DIAGNOSIS — E876 Hypokalemia: Secondary | ICD-10-CM | POA: Diagnosis present

## 2018-03-28 DIAGNOSIS — Z7901 Long term (current) use of anticoagulants: Secondary | ICD-10-CM

## 2018-03-28 DIAGNOSIS — K8020 Calculus of gallbladder without cholecystitis without obstruction: Secondary | ICD-10-CM | POA: Diagnosis present

## 2018-03-28 DIAGNOSIS — R0902 Hypoxemia: Secondary | ICD-10-CM

## 2018-03-28 DIAGNOSIS — L8990 Pressure ulcer of unspecified site, unspecified stage: Secondary | ICD-10-CM

## 2018-03-28 DIAGNOSIS — Z8701 Personal history of pneumonia (recurrent): Secondary | ICD-10-CM

## 2018-03-28 DIAGNOSIS — E1159 Type 2 diabetes mellitus with other circulatory complications: Secondary | ICD-10-CM | POA: Diagnosis present

## 2018-03-28 DIAGNOSIS — E871 Hypo-osmolality and hyponatremia: Secondary | ICD-10-CM | POA: Diagnosis present

## 2018-03-28 DIAGNOSIS — J189 Pneumonia, unspecified organism: Secondary | ICD-10-CM

## 2018-03-28 DIAGNOSIS — I252 Old myocardial infarction: Secondary | ICD-10-CM

## 2018-03-28 DIAGNOSIS — I10 Essential (primary) hypertension: Secondary | ICD-10-CM | POA: Diagnosis not present

## 2018-03-28 DIAGNOSIS — I4891 Unspecified atrial fibrillation: Secondary | ICD-10-CM | POA: Diagnosis present

## 2018-03-28 DIAGNOSIS — Z992 Dependence on renal dialysis: Secondary | ICD-10-CM | POA: Diagnosis not present

## 2018-03-28 DIAGNOSIS — I132 Hypertensive heart and chronic kidney disease with heart failure and with stage 5 chronic kidney disease, or end stage renal disease: Secondary | ICD-10-CM | POA: Diagnosis present

## 2018-03-28 DIAGNOSIS — D638 Anemia in other chronic diseases classified elsewhere: Secondary | ICD-10-CM

## 2018-03-28 DIAGNOSIS — Z452 Encounter for adjustment and management of vascular access device: Secondary | ICD-10-CM

## 2018-03-28 DIAGNOSIS — Z9114 Patient's other noncompliance with medication regimen: Secondary | ICD-10-CM

## 2018-03-28 DIAGNOSIS — N183 Chronic kidney disease, stage 3 (moderate): Secondary | ICD-10-CM

## 2018-03-28 DIAGNOSIS — Z791 Long term (current) use of non-steroidal anti-inflammatories (NSAID): Secondary | ICD-10-CM

## 2018-03-28 DIAGNOSIS — Z8249 Family history of ischemic heart disease and other diseases of the circulatory system: Secondary | ICD-10-CM

## 2018-03-28 DIAGNOSIS — Z95810 Presence of automatic (implantable) cardiac defibrillator: Secondary | ICD-10-CM | POA: Diagnosis present

## 2018-03-28 DIAGNOSIS — I21A1 Myocardial infarction type 2: Secondary | ICD-10-CM | POA: Diagnosis not present

## 2018-03-28 DIAGNOSIS — Y95 Nosocomial condition: Secondary | ICD-10-CM | POA: Diagnosis present

## 2018-03-28 DIAGNOSIS — G9341 Metabolic encephalopathy: Secondary | ICD-10-CM | POA: Diagnosis present

## 2018-03-28 DIAGNOSIS — R570 Cardiogenic shock: Secondary | ICD-10-CM | POA: Diagnosis present

## 2018-03-28 DIAGNOSIS — E1169 Type 2 diabetes mellitus with other specified complication: Secondary | ICD-10-CM | POA: Diagnosis not present

## 2018-03-28 DIAGNOSIS — R34 Anuria and oliguria: Secondary | ICD-10-CM

## 2018-03-28 DIAGNOSIS — E861 Hypovolemia: Secondary | ICD-10-CM | POA: Diagnosis present

## 2018-03-28 DIAGNOSIS — R7989 Other specified abnormal findings of blood chemistry: Secondary | ICD-10-CM | POA: Diagnosis present

## 2018-03-28 DIAGNOSIS — E669 Obesity, unspecified: Secondary | ICD-10-CM | POA: Diagnosis not present

## 2018-03-28 DIAGNOSIS — J181 Lobar pneumonia, unspecified organism: Secondary | ICD-10-CM

## 2018-03-28 DIAGNOSIS — R1031 Right lower quadrant pain: Secondary | ICD-10-CM | POA: Diagnosis not present

## 2018-03-28 DIAGNOSIS — T8241XA Breakdown (mechanical) of vascular dialysis catheter, initial encounter: Secondary | ICD-10-CM

## 2018-03-28 DIAGNOSIS — M109 Gout, unspecified: Secondary | ICD-10-CM | POA: Diagnosis present

## 2018-03-28 DIAGNOSIS — N170 Acute kidney failure with tubular necrosis: Secondary | ICD-10-CM | POA: Diagnosis present

## 2018-03-28 DIAGNOSIS — R791 Abnormal coagulation profile: Secondary | ICD-10-CM | POA: Diagnosis present

## 2018-03-28 HISTORY — DX: Pneumonia, unspecified organism: J18.9

## 2018-03-28 LAB — COMPREHENSIVE METABOLIC PANEL
ALT: 25 U/L (ref 0–44)
ANION GAP: 11 (ref 5–15)
AST: 30 U/L (ref 15–41)
Albumin: 3.3 g/dL — ABNORMAL LOW (ref 3.5–5.0)
Alkaline Phosphatase: 104 U/L (ref 38–126)
BUN: 22 mg/dL — ABNORMAL HIGH (ref 6–20)
CO2: 24 mmol/L (ref 22–32)
Calcium: 8.8 mg/dL — ABNORMAL LOW (ref 8.9–10.3)
Chloride: 103 mmol/L (ref 98–111)
Creatinine, Ser: 2.52 mg/dL — ABNORMAL HIGH (ref 0.61–1.24)
GFR calc Af Amer: 34 mL/min — ABNORMAL LOW (ref >60)
GFR, EST NON AFRICAN AMERICAN: 29 mL/min — AB (ref >60)
Glucose, Bld: 191 mg/dL — ABNORMAL HIGH (ref 70–99)
Potassium: 3.7 mmol/L (ref 3.5–5.1)
Sodium: 138 mmol/L (ref 135–145)
Total Bilirubin: 2.5 mg/dL — ABNORMAL HIGH (ref 0.3–1.2)
Total Protein: 7.7 g/dL (ref 6.5–8.1)

## 2018-03-28 LAB — CBC WITH DIFFERENTIAL/PLATELET
Abs Immature Granulocytes: 0.02 10*3/uL (ref 0.00–0.07)
Basophils Absolute: 0 10*3/uL (ref 0.0–0.1)
Basophils Relative: 1 %
EOS ABS: 0 10*3/uL (ref 0.0–0.5)
Eosinophils Relative: 1 %
HCT: 41.4 % (ref 39.0–52.0)
Hemoglobin: 12.8 g/dL — ABNORMAL LOW (ref 13.0–17.0)
Immature Granulocytes: 0 %
Lymphocytes Relative: 8 %
Lymphs Abs: 0.5 10*3/uL — ABNORMAL LOW (ref 0.7–4.0)
MCH: 29 pg (ref 26.0–34.0)
MCHC: 30.9 g/dL (ref 30.0–36.0)
MCV: 93.7 fL (ref 80.0–100.0)
Monocytes Absolute: 0.5 10*3/uL (ref 0.1–1.0)
Monocytes Relative: 9 %
Neutro Abs: 4.7 10*3/uL (ref 1.7–7.7)
Neutrophils Relative %: 81 %
Platelets: 158 10*3/uL (ref 150–400)
RBC: 4.42 MIL/uL (ref 4.22–5.81)
RDW: 13.5 % (ref 11.5–15.5)
WBC: 5.8 10*3/uL (ref 4.0–10.5)
nRBC: 0 % (ref 0.0–0.2)

## 2018-03-28 LAB — TROPONIN I: Troponin I: 0.08 ng/mL (ref <0.03)

## 2018-03-28 LAB — URINALYSIS, ROUTINE W REFLEX MICROSCOPIC
Bacteria, UA: NONE SEEN
Bilirubin Urine: NEGATIVE
Glucose, UA: 50 mg/dL — AB
Hgb urine dipstick: NEGATIVE
KETONES UR: NEGATIVE mg/dL
LEUKOCYTES UA: NEGATIVE
NITRITE: NEGATIVE
Protein, ur: 100 mg/dL — AB
Specific Gravity, Urine: 1.015 (ref 1.005–1.030)
pH: 7 (ref 5.0–8.0)

## 2018-03-28 LAB — PROTIME-INR
INR: 3.03
Prothrombin Time: 30.9 seconds — ABNORMAL HIGH (ref 11.4–15.2)

## 2018-03-28 LAB — I-STAT CG4 LACTIC ACID, ED
Lactic Acid, Venous: 0.8 mmol/L (ref 0.5–1.9)
Lactic Acid, Venous: 0.97 mmol/L (ref 0.5–1.9)

## 2018-03-28 LAB — INFLUENZA PANEL BY PCR (TYPE A & B)
Influenza A By PCR: NEGATIVE
Influenza B By PCR: NEGATIVE

## 2018-03-28 LAB — I-STAT TROPONIN, ED: Troponin i, poc: 0.12 ng/mL (ref 0.00–0.08)

## 2018-03-28 LAB — BRAIN NATRIURETIC PEPTIDE: B Natriuretic Peptide: 683.2 pg/mL — ABNORMAL HIGH (ref 0.0–100.0)

## 2018-03-28 LAB — GLUCOSE, CAPILLARY
Glucose-Capillary: 185 mg/dL — ABNORMAL HIGH (ref 70–99)
Glucose-Capillary: 199 mg/dL — ABNORMAL HIGH (ref 70–99)

## 2018-03-28 LAB — PROCALCITONIN: Procalcitonin: 3.38 ng/mL

## 2018-03-28 MED ORDER — FUROSEMIDE 10 MG/ML IJ SOLN
40.0000 mg | Freq: Once | INTRAMUSCULAR | Status: AC
  Administered 2018-03-28: 40 mg via INTRAVENOUS
  Filled 2018-03-28: qty 4

## 2018-03-28 MED ORDER — SENNOSIDES-DOCUSATE SODIUM 8.6-50 MG PO TABS
1.0000 | ORAL_TABLET | Freq: Every evening | ORAL | Status: DC | PRN
  Administered 2018-04-02: 1 via ORAL
  Filled 2018-03-28 (×2): qty 1

## 2018-03-28 MED ORDER — SODIUM CHLORIDE 0.9 % IV SOLN: 1.0000 g | INTRAVENOUS | Status: DC

## 2018-03-28 MED ORDER — WARFARIN SODIUM 2.5 MG PO TABS
2.5000 mg | ORAL_TABLET | Freq: Once | ORAL | Status: AC
  Administered 2018-03-28: 2.5 mg via ORAL
  Filled 2018-03-28 (×2): qty 1

## 2018-03-28 MED ORDER — INSULIN GLARGINE 100 UNIT/ML ~~LOC~~ SOLN: 10.0000 [IU] | Freq: Every day | SUBCUTANEOUS | Status: DC

## 2018-03-28 MED ORDER — INSULIN GLARGINE 100 UNIT/ML ~~LOC~~ SOLN
10.0000 [IU] | SUBCUTANEOUS | Status: DC
  Administered 2018-03-29 – 2018-03-31 (×3): 10 [IU] via SUBCUTANEOUS
  Filled 2018-03-28 (×6): qty 0.1

## 2018-03-28 MED ORDER — INSULIN GLARGINE 100 UNIT/ML ~~LOC~~ SOLN
10.0000 [IU] | Freq: Two times a day (BID) | SUBCUTANEOUS | Status: DC
  Filled 2018-03-28: qty 0.1

## 2018-03-28 MED ORDER — CARVEDILOL 25 MG PO TABS
25.0000 mg | ORAL_TABLET | Freq: Two times a day (BID) | ORAL | Status: DC
  Administered 2018-03-29 – 2018-03-31 (×4): 25 mg via ORAL
  Filled 2018-03-28 (×4): qty 1

## 2018-03-28 MED ORDER — HEPARIN SODIUM (PORCINE) 5000 UNIT/ML IJ SOLN: 5000.0000 [IU] | Freq: Three times a day (TID) | INTRAMUSCULAR | Status: DC

## 2018-03-28 MED ORDER — INSULIN GLARGINE 100 UNIT/ML ~~LOC~~ SOLN
25.0000 [IU] | Freq: Every day | SUBCUTANEOUS | Status: DC
  Administered 2018-03-28 – 2018-03-31 (×4): 25 [IU] via SUBCUTANEOUS
  Filled 2018-03-28 (×5): qty 0.25

## 2018-03-28 MED ORDER — HYDRALAZINE HCL 50 MG PO TABS
100.0000 mg | ORAL_TABLET | Freq: Three times a day (TID) | ORAL | Status: DC
  Administered 2018-03-29 – 2018-03-31 (×4): 100 mg via ORAL
  Filled 2018-03-28 (×7): qty 2

## 2018-03-28 MED ORDER — ACETAMINOPHEN 650 MG RE SUPP
650.0000 mg | Freq: Four times a day (QID) | RECTAL | Status: DC | PRN
  Administered 2018-03-29: 650 mg via RECTAL
  Filled 2018-03-28: qty 1

## 2018-03-28 MED ORDER — RANOLAZINE ER 500 MG PO TB12
500.0000 mg | ORAL_TABLET | Freq: Two times a day (BID) | ORAL | Status: DC
  Administered 2018-03-28 – 2018-03-29 (×2): 500 mg via ORAL
  Filled 2018-03-28 (×2): qty 1

## 2018-03-28 MED ORDER — AMIODARONE HCL 200 MG PO TABS
200.0000 mg | ORAL_TABLET | Freq: Every day | ORAL | Status: DC
  Administered 2018-03-29 – 2018-04-05 (×8): 200 mg via ORAL
  Filled 2018-03-28 (×9): qty 1

## 2018-03-28 MED ORDER — SODIUM CHLORIDE 0.9 % IV BOLUS
1000.0000 mL | Freq: Once | INTRAVENOUS | Status: AC
  Administered 2018-03-28: 1000 mL via INTRAVENOUS

## 2018-03-28 MED ORDER — PIPERACILLIN-TAZOBACTAM 3.375 G IVPB
3.3750 g | Freq: Once | INTRAVENOUS | Status: AC
  Administered 2018-03-28: 3.375 g via INTRAVENOUS
  Filled 2018-03-28: qty 50

## 2018-03-28 MED ORDER — DOXYCYCLINE HYCLATE 100 MG PO TABS
100.0000 mg | ORAL_TABLET | Freq: Two times a day (BID) | ORAL | Status: AC
  Administered 2018-03-28 – 2018-03-30 (×5): 100 mg via ORAL
  Filled 2018-03-28 (×5): qty 1

## 2018-03-28 MED ORDER — SODIUM CHLORIDE 0.9 % IV SOLN
1.0000 g | INTRAVENOUS | Status: DC
  Administered 2018-03-28: 1 g via INTRAVENOUS
  Filled 2018-03-28 (×2): qty 10

## 2018-03-28 MED ORDER — IPRATROPIUM-ALBUTEROL 0.5-2.5 (3) MG/3ML IN SOLN
3.0000 mL | RESPIRATORY_TRACT | Status: AC
  Administered 2018-03-28: 3 mL via RESPIRATORY_TRACT
  Filled 2018-03-28: qty 3

## 2018-03-28 MED ORDER — INSULIN GLARGINE 100 UNIT/ML ~~LOC~~ SOLN: 10.0000 [IU] | Freq: Every morning | SUBCUTANEOUS | Status: DC

## 2018-03-28 MED ORDER — VANCOMYCIN HCL IN DEXTROSE 1-5 GM/200ML-% IV SOLN
1000.0000 mg | Freq: Once | INTRAVENOUS | Status: AC
  Administered 2018-03-28: 1000 mg via INTRAVENOUS
  Filled 2018-03-28: qty 200

## 2018-03-28 MED ORDER — ISOSORBIDE MONONITRATE ER 60 MG PO TB24: 90.0000 mg | ORAL_TABLET | Freq: Every day | ORAL | Status: DC

## 2018-03-28 MED ORDER — WARFARIN - PHARMACIST DOSING INPATIENT
Freq: Every day | Status: DC
  Administered 2018-03-28 – 2018-04-07 (×3)

## 2018-03-28 MED ORDER — INSULIN ASPART 100 UNIT/ML ~~LOC~~ SOLN
0.0000 [IU] | Freq: Three times a day (TID) | SUBCUTANEOUS | Status: DC
  Administered 2018-03-28: 3 [IU] via SUBCUTANEOUS
  Administered 2018-03-29: 2 [IU] via SUBCUTANEOUS
  Administered 2018-03-29 (×2): 3 [IU] via SUBCUTANEOUS
  Administered 2018-03-30 (×2): 2 [IU] via SUBCUTANEOUS
  Administered 2018-03-31: 5 [IU] via SUBCUTANEOUS

## 2018-03-28 MED ORDER — ACETAMINOPHEN 325 MG PO TABS
650.0000 mg | ORAL_TABLET | Freq: Four times a day (QID) | ORAL | Status: DC | PRN
  Administered 2018-03-28 – 2018-04-01 (×9): 650 mg via ORAL
  Filled 2018-03-28 (×9): qty 2

## 2018-03-28 MED ORDER — ACETAMINOPHEN 500 MG PO TABS
1000.0000 mg | ORAL_TABLET | Freq: Once | ORAL | Status: AC
  Administered 2018-03-28: 1000 mg via ORAL
  Filled 2018-03-28: qty 2

## 2018-03-28 MED ORDER — AZITHROMYCIN 250 MG PO TABS: 500.0000 mg | ORAL_TABLET | Freq: Every day | ORAL | Status: DC

## 2018-03-28 MED ORDER — ALLOPURINOL 300 MG PO TABS
300.0000 mg | ORAL_TABLET | Freq: Every day | ORAL | Status: DC
  Administered 2018-03-28 – 2018-04-05 (×9): 300 mg via ORAL
  Filled 2018-03-28 (×10): qty 1

## 2018-03-28 MED ORDER — ATORVASTATIN CALCIUM 40 MG PO TABS
40.0000 mg | ORAL_TABLET | Freq: Every day | ORAL | Status: DC
  Administered 2018-03-28 – 2018-04-05 (×7): 40 mg via ORAL
  Filled 2018-03-28 (×8): qty 1

## 2018-03-28 MED ORDER — FUROSEMIDE 10 MG/ML IJ SOLN
100.0000 mg | Freq: Once | INTRAVENOUS | Status: AC
  Administered 2018-03-29: 100 mg via INTRAVENOUS
  Filled 2018-03-28: qty 10

## 2018-03-28 NOTE — ED Notes (Addendum)
Patient transported to X-ray 

## 2018-03-28 NOTE — ED Triage Notes (Signed)
Pt reports that he has been nauseous with vomiting and a fever since last night. Pt denies any shortness of breath. Pt reports that he does wear oxygen at night at home. Pt 80% on room air.

## 2018-03-28 NOTE — H&P (Addendum)
Date: 03/28/2018               Patient Name:  Travis Bryan MRN: [id number]  DOB: 07/19/1970 Age / Sex: 48 y.o., male   PCP: Carroll Sage, MD         Medical Service: Internal Medicine Teaching Service         Attending Physician: Dr. Rebeca Alert Raynaldo Opitz, MD    First Contact: Dr. Laural Golden Pager: [phone]  Second Contact: Dr. Tarri Abernethy Pager: [phone]       After Hours (After 5p/  First Contact Pager: [phone]  weekends / holidays): Second Contact Pager: [phone]   Chief Complaint: chills  History of Present Illness: [name] is a 48 year old male with HTN, cardiomyopathy with AICD, atrial fibrillation, CHF and diabetes presenting with a one day history of chills and vomiting. He was recently admitted with DKA and pneumonia in 12/19 with a similar presentation.  He reports feeling generally unwell with nausea, dizziness and productive cough with yellowish sputum. Denies chest pain, abdominal pain, SOB, headaches, body aches. No recent sick contacts. He was unable to confirm which antibiotics he was discharged with from his December admission nor how long he took them. Per chart review he was treated with IV rocephin and discharged with 10 days of Augmentin. Reports no changes in his medications. No trouble urinating, no diarrhea or constipation.   ED course: On arrival, he was febrile 102.6, hypertensive 151/87 and tachypneic 29.  Oxygen saturation was 80% on room air.  He was put on 5 L nasal cannula and saturating at 94%.  I-STAT lactic acid within normal limits.  I-STAT troponin 0.12.  CMP showed an elevated T bili at 2.5.  BNP 683.2.  Urinalysis showed proteinuria.  Chest x-ray showed bilateral pulmonary infiltrates question pulmonary edema versus infection.  He was started on IV Vanco and Zosyn.  Also given a 1 L bolus of normal saline.  Meds:  Current Meds  Medication Sig  . allopurinol (ZYLOPRIM) 300 MG tablet Take 300 mg by mouth daily.   Marland Kitchen amiodarone (PACERONE) 200 MG  tablet Take 1 tablet (200 mg total) by mouth daily.  Marland Kitchen atorvastatin (LIPITOR) 40 MG tablet Take 1 tablet (40 mg total) by mouth daily.  . insulin aspart (NOVOLOG) 100 UNIT/ML injection Substitute to any brand approved.Before each meal 3 times a day, 140-199 - 2 units, 200-250 - 4 units, 251-299 - 6 units,  300-349 - 8 units,  350 or above 10 units. Dispense syringes and needles as needed, Ok to switch to PEN if approved. DX DM2, Code E11.65 (Patient taking differently: Inject 10 Units into the skin See admin instructions. Substitute to any brand approved.Before each meal 3 times a day, 140-199 - 2 units, 200-250 - 4 units, 251-299 - 6 units,  300-349 - 8 units,  350 or above 10 units. Dispense syringes and needles as needed, Ok to switch to PEN if approved. DX DM2, Code E11.65)  . insulin glargine (LANTUS) 100 UNIT/ML injection Inject 0.25 mLs (25 Units total) into the skin 2 (two) times daily. Dispense insulin pen if approved, if not dispense as needed syringes and needles for 1 month supply. Can switch to Levemir. Diagnosis E 11.65. (Patient taking differently: Inject 25 Units into the skin at bedtime. )  . potassium chloride SA (K-DUR,KLOR-CON) 20 MEQ tablet TAKE 2 TABLETS BY MOUTH IN THE MORNING AND 1 TABLET IN THE EVENING (Patient taking differently: Take 40 mEq by mouth See admin  instructions. TAKE 40 mg  IN THE MORNING AND 20 mg TABLET IN THE EVENING)  . ranolazine (RANEXA) 500 MG 12 hr tablet TAKE 1 TABLET (500 MG TOTAL) BY MOUTH 2 (TWO) TIMES DAILY.  Marland Kitchen torsemide (DEMADEX) 20 MG tablet Take 3 tablets (60 mg total) by mouth daily.  Marland Kitchen warfarin (COUMADIN) 5 MG tablet Take 1 tablet (5 mg total) by mouth daily at 6 PM. Take As Directed by Coumadin Clinic (Patient taking differently: Take 5-7.5 mg by mouth daily at 6 PM. Take 7.5 mg on Mon. Wed. Fri Take 5 mg tues, thurs, sat. sunday)     Allergies: Allergies as of 03/28/2018 - Review Complete 03/28/2018  Allergen Reaction Noted  . No known  allergies  04/10/2016   Past Medical History:  Diagnosis Date  . AICD (automatic cardioverter/defibrillator) present   . Atrial flutter ([place])    a. s/p ablation 03/2016  . Chronic systolic CHF (congestive heart failure) ([place])   . Essential hypertension 12/26/2005   Qualifier: Diagnosis of  By: Jerilee Hoh MD, Olam Idler    . History of gout   . Hyperlipidemia   . Hypertension   . Myocardial infarction [facility])    "I think I had one a long long time ago" (03/28/2016)  . NICM (nonischemic cardiomyopathy) ([place])    a. LHC 6/06: pLAD 20, pLCx 20-30; b. Echo 5/15:  EF 15%, diffuse HK, restrictive physiology, trivial AI, trivial MR, mild LAE, moderate RVE, moderately reduced RVSF, moderate RAE, mild to moderate TR, PASP 43 mmHg  . Obesity   . Persistent atrial fibrillation   . Type II diabetes mellitus (HCC)     Family History:  Family History  Problem Relation Age of Onset  . Hypertension Mother   . Heart disease Mother   . Diabetes Mother     Social History:  Patient lives at home with his family. Performs all his ADL's. Is currently unemployed. Distant history of smoking and alcohol use. Denies tobacco, alcohol or illicit drug use.   Review of Systems: A complete ROS was negative except as per HPI.   Physical Exam: Blood pressure 128/78, pulse 69, temperature 100.2 F (37.9 C), temperature source Oral, resp. rate (!) 21, height 5\' 10"  (1.778 m), weight 117.9 kg, SpO2 92 %.   Physical Exam  Constitutional: He is oriented to person, place, and time. No distress.  Cardiovascular: Normal rate, regular rhythm and normal heart sounds.  No murmur heard. Pulmonary/Chest: Effort normal and breath sounds normal. No respiratory distress.  Abdominal: Soft. He exhibits no distension. There is no abdominal tenderness.  Musculoskeletal:        General: No tenderness or edema.  Neurological: He is alert and oriented to person, place, and time.  Skin: Skin is warm. He is diaphoretic.  Psychiatric:  Memory and affect normal.    EKG: personally reviewed my interpretation is sinus rhythm, prolonged QT, widened QRS   CXR: personally reviewed my interpretation is pulmonary vascular congestion, bilateral pulmonary infiltrates  Assessment & Plan by Problem: Active Problems:   Acute respiratory failure with hypoxia [facility])  [name] is a 48 year old male with HTN, cardiomyopathy with AICD, atrial fibrillation, CHF and diabetes presenting with a one day history of chills and vomiting. He was recently admitted with DKA and pneumonia in 12/19 with a similar presentation.  He reports feeling generally unwell with nausea, dizziness and productive cough. CXR showed bilateral pulmonary infiltrates with pulmonary congestion. On arrival, he was febrile 102.6, hypertensive 151/87 and tachypneic 29.  Oxygen saturation was 80% on room air.  He was put on 5 L nasal cannula and saturating at 94%. Started on vancomycin and zoysn in the ED.   Acute hypoxic respiratory failure likely 2/2 to CAP Patient was recently admitted 12/19 for strep pneumonia CAP with bacteremia and sepsis. He was treated with IV Rocephin which was discharged to continue Augmentin for 10 days.  He is now presenting with several days of cough, nausea, dizziness, vomiting and fevers.  On admission febrile at 102.6 and oxygen saturation of 80% on room air. No leukocytosis. CXR shows bilateral pulmonary infiltrates with pulmonary congestion. Influenza panel negative. Most likely recurrent CAP. Susceptibilities from most recent blood cultures positive for strep pneumoniae were pan sensitive. He was started on vancomycin and zoysn in the ED.  - switch abx to ceftriaxone and doxycycline - am cbc - f/u blood cultures - f/u strep pneumo urine antigen  Chronic systolic CHF EF 25 to 95%.  Has AICD. Home medications include Coreg 25 mg twice daily, hydralazine 100 mg 3 times daily, Imdur 90 mg daily, Levazine 500 mg daily, torsemide 60 mg daily,  and potassium 40 mEq in the morning and 20 mEq at night. Unclear if taking Imdur, will hold for now - Continue hydralazine, carvedilol and resume diuretics when appropriate - Will give a one time dose of furosemide 40 mg IV  - Daily weights - Strict ins and outs - Bmets   Elevated Troponin Patient presented with an I-stat troponin of 0.12. EKG showed no ischemic changes, prolonged QT interval and widened QRS. Denies chest pain or SOB. History of elevated troponin's, likely demand ischemia  - Follow up troponin  Elevated T bili T bili was 2.5 on admission. Denies abdominal pain.  - Will monitor - Am cmp  CKD IV -Baseline creatinine is close to 2.7, Cr on admission 2.52  Atrial fibrillation/atrial flutter status post ablation - CHADS-VASc Score of 4 - Continue amiodarone 200 mg daily and carvedilol 25 mg twice daily, continue warfarin per pharmacy  Hypertension Home medications include amlodipine 5 mg QD, carvedilol 25 mg QD, hydralazine 100 mg QD, Imdur 90 mg QD, and torsemide 60 mg QD - Will continue carvedilol 25 mg bid, hydralazine 100 mg tid - Hold amlodipine, torsemide and metolazone for now, can restart if needed  Type 2 diabetes - Most recent A1c is 10.3 - Patient reports Lantus 10 units in the am and 25 units pm, will continue as such - SSI - CBG monitoring  Cardiomyopathy with AICD CAD  History of MI - Home medications include carvedilol, warfarin, atorstatin and Ranexa  Hyperlipidemia - Continue atorvastatin 40 mg daily  Gout - Continue allopurinol 300 mg daily   Diet: Heart healthy DVT prophylaxis: Warfarin Full Code  Dispo: Admit patient to Inpatient with expected length of stay greater than 2 midnights.  SignedMike Craze, DO 03/28/2018, 4:02 PM  Pager: [phone]

## 2018-03-28 NOTE — Progress Notes (Addendum)
Travis Bryan was evaluated at bedside for decreased respiratory status. He was noted to be febrile to 103F after receiving tylenol two hours prior. Nursing placed ice packs and also noted new expiratory wheezing and O2 saturation of 80% despite increasing [place] to 8L. Upon entering the room, Travis Bryan did not appear to be in acute distress. He endorsed mild shortness of breath. But denied chest pain, palpitations, or abdominal/lower extremity swelling. He did report that he hasn't taken any of his home medications today prior to coming to the hospital and the last time he took his home torsemide was 2 days ago.    HR 91, RR 25, BP 156/89, O2 80% on 8L [place] Gen: NAD, alert, axillary ice packs and wet cloth on forehead  Pulm: breathing comfortably on [place] but saturations are poor. No accessory muscle use. Speaking in full sentences without difficulty. Faint expiratory wheezes throughout and significant bilateral crackles midway up the posterior lung fields.  Cardiac: +JVD, RRR, no m/r/g  Abd: obese abdomen, soft, NT, no edema Ext: no LEE  A/P:  Travis Bryan is a 48 year old male with HTN, cardiomyopathy with AICD, atrial fibrillation, CHF (EF 25-30%) and diabetes who presented with acute hypoxic respiratory failure, fever, and CXR showing bilateral pulmonary infiltrates with pulmonary congestion and was admitted for treatment of community acquired pneumonia. His worsening hypoxic respiratory failure is most likely due to acute on chronic heart failure exacerbation in the setting of medication non-compliance. His underlying pneumonia is also contributing. He was given vanco, zosyn, and  in the ED around 1pm and is now having recurrent, worsening fevers. Ceftriaxone and azithromycin are scheduled for tomorrow. Blood cultures have not yet resulted. Zosyn is given q6h so he would be due for another dose of antibiotics. Will administer ceftriaxone now.  He was given 40mg  IV lasix early and reports good urine output.  Will diurese more. Given his significant drop in oxygen saturation and tachypnea, will also try bipap for a short amount of time while diuresis is underway.  - start IV ceftriaxone  - 100mg  IV lasix - duoneb  - bipap  - ABG  Francesco Runner, MD Internal Medicine, PGY1 Pager: [phone] 03/28/2018, 11:57 PM

## 2018-03-28 NOTE — ED Notes (Signed)
Pt informed that urine sample is needed. Pt unable to provide sample at this moment.

## 2018-03-28 NOTE — Progress Notes (Signed)
ANTICOAGULATION CONSULT NOTE - Initial Consult  Pharmacy Consult for warfarin Indication: atrial fibrillation  Allergies  Allergen Reactions  . No Known Allergies     Patient Measurements: Height: 5\' 10"  (177.8 cm) Weight: 260 lb (117.9 kg) IBW/kg (Calculated) : 73  Vital Signs: Temp: 100.2 F (37.9 C) (01/16 1312) Temp Source: Oral (01/16 1312) BP: 130/84 (01/16 1545) Pulse Rate: 63 (01/16 1545)  Labs: Recent Labs    03/28/18 1040 03/28/18 1540  HGB 12.8*  --   HCT 41.4  --   PLT 158  --   LABPROT  --  30.9*  INR  --  3.03  CREATININE 2.52*  --     Estimated Creatinine Clearance: 46.6 mL/min (A) (by C-G formula based on SCr of 2.52 mg/dL (H)).   Medical History: Past Medical History:  Diagnosis Date  . AICD (automatic cardioverter/defibrillator) present   . Atrial flutter ([place])    a. s/p ablation 03/2016  . Chronic systolic CHF (congestive heart failure) ([place])   . Essential hypertension 12/26/2005   Qualifier: Diagnosis of  By: Jerilee Hoh MD, Olam Idler    . History of gout   . Hyperlipidemia   . Hypertension   . Myocardial infarction [facility])    "I think I had one a long long time ago" (03/28/2016)  . NICM (nonischemic cardiomyopathy) ([place])    a. LHC 6/06: pLAD 20, pLCx 20-30; b. Echo 5/15:  EF 15%, diffuse HK, restrictive physiology, trivial AI, trivial MR, mild LAE, moderate RVE, moderately reduced RVSF, moderate RAE, mild to moderate TR, PASP 43 mmHg  . Obesity   . Persistent atrial fibrillation   . Type II diabetes mellitus (HCC)    Assessment: 8 yom presented to the ED with N/V and fever. He is on chronic warfarin for history of afib. INR is slightly above goal. No bleeding noted.   Goal of Therapy:  INR 2-3 Monitor platelets by anticoagulation protocol: Yes   Plan:  Warfarin 2.5mg  PO x 1 tonight Daily INR  Shreyan Hinz, Rande Lawman 03/28/2018,4:30 PM

## 2018-03-28 NOTE — ED Provider Notes (Signed)
[place] EMERGENCY DEPARTMENT Provider Note   CSN: [id number] Arrival date & time: 03/28/18  1016     History   Chief Complaint Chief Complaint  Patient presents with  . Nausea  . Fever    HPI Travis Bryan is a 48 y.o. male.  Patient is a 48 year old male with extensive past medical history including cardiomyopathy with AICD, atrial fib, CHF, diabetes.  He recently had admission for DKA and pneumonia.  For the past several days he reports nausea and lightheadedness with standing and feeling generally unwell.  He went to the kidney doctor today who found him to have a fever and low oxygen saturations.  He was then sent here for further evaluation.  Patient does report cough that is slightly productive.  The history is provided by the patient.  Fever  Max temp prior to arrival:  120.8 Severity:  Moderate Onset quality:  Gradual Duration:  3 days Timing:  Constant Progression:  Worsening Chronicity:  New Relieved by:  Nothing Worsened by:  Nothing Ineffective treatments:  None tried Associated symptoms: chills and cough   Associated symptoms: no chest pain, no nausea and no sore throat     Past Medical History:  Diagnosis Date  . AICD (automatic cardioverter/defibrillator) present   . Atrial flutter ([place])    a. s/p ablation 03/2016  . Chronic systolic CHF (congestive heart failure) ([place])   . Essential hypertension 12/26/2005   Qualifier: Diagnosis of  By: Jerilee Hoh MD, Olam Idler    . History of gout   . Hyperlipidemia   . Hypertension   . Myocardial infarction [facility])    "I think I had one a long long time ago" (03/28/2016)  . NICM (nonischemic cardiomyopathy) ([place])    a. LHC 6/06: pLAD 20, pLCx 20-30; b. Echo 5/15:  EF 15%, diffuse HK, restrictive physiology, trivial AI, trivial MR, mild LAE, moderate RVE, moderately reduced RVSF, moderate RAE, mild to moderate TR, PASP 43 mmHg  . Obesity   . Persistent atrial fibrillation   . Type II diabetes  [place] [facility])     Patient Active Problem List   Diagnosis Date Noted  . DKA (diabetic ketoacidosis) ([place]) 02/25/2018  . Chronic systolic CHF (congestive heart failure) ([place]) 02/25/2018  . Hyperlipidemia 02/25/2018  . Atrial fibrillation ([place]) 02/25/2018  . CAD (coronary artery disease) 02/25/2018  . Elevated troponin 02/25/2018  . Sepsis due to pneumonia ([place]) 02/25/2018  . AKI (acute kidney injury) ([place]) 02/25/2018  . Type 2 diabetes mellitus with complication, with long-term current use of insulin ([place]) 12/26/2017  . At risk for sexually transmitted disease due to unprotected sex 08/23/2017  . Healthcare maintenance 06/24/2016  . Persistent atrial fibrillation   . CARDIOMYOPATHY, SECONDARY 07/03/2008  . HFrEF (heart failure with reduced ejection fraction) ([place]) 07/03/2008  . Automatic implantable cardioverter-defibrillator in situ 07/03/2008  . Type 2 diabetes mellitus ([place]) 12/26/2005  . Hyperlipidemia associated with type 2 diabetes mellitus ([place]) 12/26/2005  . Hypertension associated with diabetes ([place]) 12/26/2005    Past Surgical History:  Procedure Laterality Date  . CARDIOVERSION N/A 04/07/2016   Procedure: CARDIOVERSION;  Surgeon: Thayer Headings, MD;  Location: [facility] ENDOSCOPY;  Service: Cardiovascular;  Laterality: N/A;  . CARDIOVERSION N/A 04/11/2016   Procedure: CARDIOVERSION;  Surgeon: Larey Dresser, MD;  Location: [place];  Service: Cardiovascular;  Laterality: N/A;  . ELECTROPHYSIOLOGIC STUDY N/A 03/31/2016   Procedure: A-Flutter Ablation;  Surgeon: Evans Lance, MD;  Location: [place] CV LAB;  Service: Cardiovascular;  Laterality: N/A;  . IMPLANTABLE CARDIOVERTER DEFIBRILLATOR (ICD) GENERATOR CHANGE N/A 08/27/2013   Procedure: ICD GENERATOR CHANGE;  Surgeon: Evans Lance, MD;  Location[facility] CATH LAB;  Service: Cardiovascular;  Laterality: N/A;  . TEE WITHOUT CARDIOVERSION N/A 03/31/2016   Procedure: TRANSESOPHAGEAL ECHOCARDIOGRAM (TEE);  Surgeon: Larey Dresser, MD;  Location: [place];  Service: Cardiovascular;  Laterality: N/A;        Home Medications    Prior to Admission medications   Medication Sig Start Date End Date Taking? Authorizing Provider  allopurinol (ZYLOPRIM) 300 MG tablet Take 300 mg by mouth daily.     [provider]  amiodarone (PACERONE) 200 MG tablet Take 1 tablet (200 mg total) by mouth daily. 12/26/17   Carroll Sage, MD  amLODipine (NORVASC) 5 MG tablet Take 1 tablet (5 mg total) by mouth daily. 08/23/17   Lenore Cordia, MD  amoxicillin-clavulanate (AUGMENTIN) 875-125 MG tablet Take 1 tablet by mouth every 12 (twelve) hours. 02/28/18   Thurnell Lose, MD  atorvastatin (LIPITOR) 40 MG tablet Take 1 tablet (40 mg total) by mouth daily. 08/23/17   Lenore Cordia, MD  blood glucose meter kit and supplies KIT Dispense based on patient and insurance preference. Use up to four times daily as directed. (FOR ICD-9 250.00, 250.01). For QAC - HS accuchecks. 02/28/18   Thurnell Lose, MD  calcitRIOL (ROCALTROL) 0.25 MCG capsule Take 0.25 mcg by mouth daily.    [provider]  carvedilol (COREG) 25 MG tablet TAKE 1 TABLET BY MOUTH TWICE A DAY WITH A MEAL. Patient taking differently: Take 25 mg by mouth 2 (two) times daily with a meal.  12/14/17   Larey Dresser, MD  colchicine 0.6 MG tablet Take 1 tablet (0.6 mg total) by mouth daily. 02/28/18   Thurnell Lose, MD  diclofenac sodium (VOLTAREN) 1 % GEL Apply 2 g topically 4 (four) times daily. 02/28/18   Thurnell Lose, MD  hydrALAZINE (APRESOLINE) 100 MG tablet TAKE 1 TABLET BY MOUTH 3 TIMES DAILY. 11/09/17   Larey Dresser, MD  insulin aspart (NOVOLOG) 100 UNIT/ML injection Substitute to any brand approved.Before each meal 3 times a day, 140-199 - 2 units, 200-250 - 4 units, 251-299 - 6 units,  300-349 - 8 units,  350 or above 10 units. Dispense syringes and needles as needed, Ok to switch to PEN if approved. DX DM2, Code E11.65 02/28/18    Thurnell Lose, MD  insulin glargine (LANTUS) 100 UNIT/ML injection Inject 0.25 mLs (25 Units total) into the skin 2 (two) times daily. Dispense insulin pen if approved, if not dispense as needed syringes and needles for 1 month supply. Can switch to Levemir. Diagnosis E 11.65. 02/28/18   Thurnell Lose, MD  Insulin Syringe-Needle U-100 25G X 1" 1 ML MISC For 4 times a day insulin SQ, 1 month supply. Diagnosis E11.65 02/28/18   Thurnell Lose, MD  Insulin Syringe-Needle U-100 31G X 15/64" 0.3 ML MISC Use to inject insulin 4 times a day 07/27/16   Sid Falcon, MD  isosorbide mononitrate (IMDUR) 30 MG 24 hr tablet TAKE 3 TABLETS (90 MG TOTAL) BY MOUTH DAILY. 09/07/17 02/25/18  Larey Dresser, MD  magnesium oxide (MAG-OX) 400 (241.3 Mg) MG tablet Take 1 tablet (400 mg total) by mouth daily. 04/15/16   Shirley Friar, PA-C  metolazone (ZAROXOLYN) 2.5 MG tablet TAKE 1 TABLET BY MOUTH AS NEEDED AS DIRECTED Patient taking differently: Take  2.5 mg by mouth as needed (fluid).  12/15/16   Bensimhon, Shaune Pascal, MD  potassium chloride SA (K-DUR,KLOR-CON) 20 MEQ tablet TAKE 2 TABLETS BY MOUTH IN THE MORNING AND 1 TABLET IN THE EVENING 01/16/18   Larey Dresser, MD  ranolazine (RANEXA) 500 MG 12 hr tablet TAKE 1 TABLET (500 MG TOTAL) BY MOUTH 2 (TWO) TIMES DAILY. 12/19/17   Larey Dresser, MD  torsemide (DEMADEX) 20 MG tablet Take 3 tablets (60 mg total) by mouth daily. 12/26/17   Carroll Sage, MD  warfarin (COUMADIN) 5 MG tablet Take 1 tablet (5 mg total) by mouth daily at 6 PM. Take As Directed by Coumadin Clinic 03/01/18   Thurnell Lose, MD    Family History Family History  Problem Relation Age of Onset  . Hypertension Mother   . Heart disease Mother   . Diabetes Mother     Social History Social History   Tobacco Use  . Smoking status: Former Smoker    Years: 10.00    Types: Cigars    Last attempt to quit: 07/28/2002    Years since quitting: 15.6  . Smokeless tobacco:  Never Used  Substance Use Topics  . Alcohol use: No    Comment: 03/28/2016 "stopped drinking 4-5 months ago"  . Drug use: No     Allergies   No known allergies   Review of Systems Review of Systems  Constitutional: Positive for chills and fever.  HENT: Negative for sore throat.   Respiratory: Positive for cough.   Cardiovascular: Negative for chest pain.  Gastrointestinal: Negative for nausea.  All other systems reviewed and are negative.    Physical Exam Updated Vital Signs BP (!) 151/87 (BP Location: Left Arm)   Pulse 85   Temp (!) 102.6 F (39.2 C) (Oral)   Resp 18   Ht '5\' 10"'  (1.778 m)   Wt 117.9 kg   SpO2 (!) 80%   BMI 37.31 kg/m   Physical Exam Vitals signs and nursing note reviewed.  Constitutional:      General: He is not in acute distress.    Appearance: He is well-developed. He is not diaphoretic.  HENT:     Head: Normocephalic and atraumatic.  Neck:     Musculoskeletal: Normal range of motion and neck supple.  Cardiovascular:     Rate and Rhythm: Normal rate and regular rhythm.     Heart sounds: No murmur. No friction rub.  Pulmonary:     Effort: Pulmonary effort is normal. No respiratory distress.     Breath sounds: Rales present. No wheezing.     Comments: There are rales in the bases bilaterally. Abdominal:     General: Bowel sounds are normal. There is no distension.     Palpations: Abdomen is soft.     Tenderness: There is no abdominal tenderness.  Musculoskeletal: Normal range of motion.  Skin:    General: Skin is warm and dry.  Neurological:     Mental Status: He is alert and oriented to person, place, and time.     Coordination: Coordination normal.      ED Treatments / Results  Labs (all labs ordered are listed, but only abnormal results are displayed) Labs Reviewed  CULTURE, BLOOD (ROUTINE X 2)  CULTURE, BLOOD (ROUTINE X 2)  URINE CULTURE  COMPREHENSIVE METABOLIC PANEL  CBC WITH DIFFERENTIAL/PLATELET  BRAIN NATRIURETIC  PEPTIDE  URINALYSIS, ROUTINE W REFLEX MICROSCOPIC  I-STAT TROPONIN, ED  I-STAT CG4 LACTIC ACID, ED  EKG EKG Interpretation  Date/Time:  Thursday March 28 2018 10:30:12 EST Ventricular Rate:  81 PR Interval:    QRS Duration: 181 QT Interval:  449 QTC Calculation: 522 R Axis:   175 Text Interpretation:  Sinus rhythm Prolonged PR interval Nonspecific intraventricular conduction delay Confirmed by Veryl Speak [phone]) on 03/28/2018 10:33:15 AM   Radiology No results found.  Procedures Procedures (including critical care time)  Medications Ordered in ED Medications  sodium chloride 0.9 % bolus 1,000 mL (has no administration in time range)     Initial Impression / Assessment and Plan / ED Course  I have reviewed the triage vital signs and the nursing notes.  Pertinent labs & imaging results that were available during my care of the patient were reviewed by me and considered in my medical decision making (see chart for details).  Patient presents here with nausea, dizziness, and fever to 102.8.  He was seen at his primary doctor's office and found to have low oxygen saturations and cough.  Work-up shows bilateral infiltrates, but laboratory studies that are otherwise consistent with baseline.  He was given IV vancomycin and Zosyn to treat for H CAP after blood cultures were obtained.  Patient with oxygen saturations of 80% on room air and will be admitted to the teaching service for further care.  CRITICAL CARE Performed by: Veryl Speak Total critical care time: 35 minutes Critical care time was exclusive of separately billable procedures and treating other patients. Critical care was necessary to treat or prevent imminent or life-threatening deterioration. Critical care was time spent personally by me on the following activities: development of treatment plan with patient and/or surrogate as well as nursing, discussions with consultants, evaluation of patient's response  to treatment, examination of patient, obtaining history from patient or surrogate, ordering and performing treatments and interventions, ordering and review of laboratory studies, ordering and review of radiographic studies, pulse oximetry and re-evaluation of patient's condition.   Final Clinical Impressions(s) / ED Diagnoses   Final diagnoses:  None    ED Discharge Orders    None       Veryl Speak, MD 03/28/18 1411

## 2018-03-28 NOTE — ED Notes (Signed)
Dr. Delo aware of elevated troponin.  

## 2018-03-28 NOTE — ED Notes (Signed)
Attempted report 

## 2018-03-28 NOTE — Progress Notes (Signed)
CRITICAL VALUE ALERT  Critical Value:  TROPONIN - 0.08  Date & Time Notied:  03/28/2018 @ 2020  Provider Notified: VOGE  Orders Received/Actions taken: Awaiting instructions.

## 2018-03-29 ENCOUNTER — Encounter (HOSPITAL_COMMUNITY): Payer: Self-pay | Admitting: Pulmonary Disease

## 2018-03-29 DIAGNOSIS — J189 Pneumonia, unspecified organism: Secondary | ICD-10-CM | POA: Diagnosis present

## 2018-03-29 DIAGNOSIS — J9601 Acute respiratory failure with hypoxia: Secondary | ICD-10-CM

## 2018-03-29 DIAGNOSIS — I482 Chronic atrial fibrillation, unspecified: Secondary | ICD-10-CM

## 2018-03-29 LAB — BLOOD GAS, ARTERIAL
Acid-Base Excess: 2.2 mmol/L — ABNORMAL HIGH (ref 0.0–2.0)
Acid-Base Excess: 2.2 mmol/L — ABNORMAL HIGH (ref 0.0–2.0)
BICARBONATE: 26.8 mmol/L (ref 20.0–28.0)
Bicarbonate: 25.9 mmol/L (ref 20.0–28.0)
Delivery systems: POSITIVE
Delivery systems: POSITIVE
Drawn by: 546051
Drawn by: 275531
Expiratory PAP: 8
Expiratory PAP: 8
FIO2: 0.8
Inspiratory PAP: 15
Inspiratory PAP: 14
LHR: 12 {breaths}/min
Mode: POSITIVE
O2 Content: 10 L/min
O2 Saturation: 85.5 %
O2 Saturation: 90.8 %
Patient temperature: 103
Patient temperature: 104.3
pCO2 arterial: 43.4 mmHg (ref 32.0–48.0)
pCO2 arterial: 52.7 mmHg — ABNORMAL HIGH (ref 32.0–48.0)
pH, Arterial: 7.407 (ref 7.350–7.450)
pH, Arterial: 7.344 — ABNORMAL LOW (ref 7.350–7.450)
pO2, Arterial: 57.6 mmHg — ABNORMAL LOW (ref 83.0–108.0)
pO2, Arterial: 74.3 mmHg — ABNORMAL LOW (ref 83.0–108.0)

## 2018-03-29 LAB — RAPID URINE DRUG SCREEN, HOSP PERFORMED
AMPHETAMINES: NOT DETECTED
Amphetamines: NOT DETECTED
Barbiturates: NOT DETECTED
Barbiturates: NOT DETECTED
Benzodiazepines: NOT DETECTED
Benzodiazepines: NOT DETECTED
Cocaine: NOT DETECTED
Cocaine: NOT DETECTED
Opiates: NOT DETECTED
Opiates: NOT DETECTED
Tetrahydrocannabinol: NOT DETECTED
Tetrahydrocannabinol: NOT DETECTED

## 2018-03-29 LAB — RESPIRATORY PANEL BY PCR

## 2018-03-29 LAB — GLUCOSE, CAPILLARY
Glucose-Capillary: 196 mg/dL — ABNORMAL HIGH (ref 70–99)
Glucose-Capillary: 189 mg/dL — ABNORMAL HIGH (ref 70–99)
Glucose-Capillary: 129 mg/dL — ABNORMAL HIGH (ref 70–99)
Glucose-Capillary: 149 mg/dL — ABNORMAL HIGH (ref 70–99)
Glucose-Capillary: 109 mg/dL — ABNORMAL HIGH (ref 70–99)

## 2018-03-29 LAB — COMPREHENSIVE METABOLIC PANEL
ALT: 27 U/L (ref 0–44)
AST: 35 U/L (ref 15–41)
Albumin: 3.1 g/dL — ABNORMAL LOW (ref 3.5–5.0)
Alkaline Phosphatase: 100 U/L (ref 38–126)
Anion gap: 9 (ref 5–15)
BUN: 23 mg/dL — ABNORMAL HIGH (ref 6–20)
CO2: 28 mmol/L (ref 22–32)
Calcium: 8.3 mg/dL — ABNORMAL LOW (ref 8.9–10.3)
Chloride: 102 mmol/L (ref 98–111)
Creatinine, Ser: 2.62 mg/dL — ABNORMAL HIGH (ref 0.61–1.24)
GFR calc Af Amer: 32 mL/min — ABNORMAL LOW (ref >60)
GFR calc non Af Amer: 28 mL/min — ABNORMAL LOW (ref >60)
Glucose, Bld: 177 mg/dL — ABNORMAL HIGH (ref 70–99)
Potassium: 3.7 mmol/L (ref 3.5–5.1)
Sodium: 139 mmol/L (ref 135–145)
Total Bilirubin: 2.5 mg/dL — ABNORMAL HIGH (ref 0.3–1.2)
Total Protein: 7.1 g/dL (ref 6.5–8.1)

## 2018-03-29 LAB — MAGNESIUM: Magnesium: 1.5 mg/dL — ABNORMAL LOW (ref 1.7–2.4)

## 2018-03-29 LAB — CBC
HEMATOCRIT: 41.9 % (ref 39.0–52.0)
Hemoglobin: 12.9 g/dL — ABNORMAL LOW (ref 13.0–17.0)
MCH: 29.3 pg (ref 26.0–34.0)
MCHC: 30.8 g/dL (ref 30.0–36.0)
MCV: 95 fL (ref 80.0–100.0)
Platelets: 148 10*3/uL — ABNORMAL LOW (ref 150–400)
RBC: 4.41 MIL/uL (ref 4.22–5.81)
RDW: 13.5 % (ref 11.5–15.5)
WBC: 6 10*3/uL (ref 4.0–10.5)
nRBC: 0 % (ref 0.0–0.2)

## 2018-03-29 LAB — PROTIME-INR
INR: 2.71
Prothrombin Time: 28.4 seconds — ABNORMAL HIGH (ref 11.4–15.2)

## 2018-03-29 LAB — PHOSPHORUS: Phosphorus: 2.5 mg/dL (ref 2.5–4.6)

## 2018-03-29 LAB — URINE CULTURE: Culture: NO GROWTH

## 2018-03-29 LAB — MRSA PCR SCREENING: MRSA by PCR: NEGATIVE

## 2018-03-29 LAB — BILIRUBIN, FRACTIONATED(TOT/DIR/INDIR)
Bilirubin, Direct: 0.5 mg/dL — ABNORMAL HIGH (ref 0.0–0.2)
Indirect Bilirubin: 1.7 mg/dL — ABNORMAL HIGH (ref 0.3–0.9)
Total Bilirubin: 2.2 mg/dL — ABNORMAL HIGH (ref 0.3–1.2)

## 2018-03-29 LAB — TROPONIN I
Troponin I: 0.18 ng/mL (ref <0.03)
Troponin I: 0.25 ng/mL (ref <0.03)

## 2018-03-29 LAB — STREP PNEUMONIAE URINARY ANTIGEN: Strep Pneumo Urinary Antigen: NEGATIVE

## 2018-03-29 LAB — PROCALCITONIN: PROCALCITONIN: 16.41 ng/mL

## 2018-03-29 MED ORDER — ONDANSETRON HCL 4 MG/2ML IJ SOLN
4.0000 mg | Freq: Four times a day (QID) | INTRAMUSCULAR | Status: DC | PRN
  Administered 2018-03-29 – 2018-04-22 (×2): 4 mg via INTRAVENOUS
  Filled 2018-03-29 (×3): qty 2

## 2018-03-29 MED ORDER — ONDANSETRON HCL 4 MG PO TABS: 4.0000 mg | ORAL_TABLET | Freq: Four times a day (QID) | ORAL | Status: DC | PRN

## 2018-03-29 MED ORDER — VANCOMYCIN HCL 10 G IV SOLR
1500.0000 mg | INTRAVENOUS | Status: DC
  Administered 2018-03-29: 1500 mg via INTRAVENOUS
  Filled 2018-03-29: qty 1500

## 2018-03-29 MED ORDER — FUROSEMIDE 10 MG/ML IJ SOLN
100.0000 mg | Freq: Once | INTRAVENOUS | Status: AC
  Administered 2018-03-29: 100 mg via INTRAVENOUS
  Filled 2018-03-29: qty 10

## 2018-03-29 MED ORDER — SODIUM CHLORIDE 0.9 % IV SOLN
1.0000 g | Freq: Two times a day (BID) | INTRAVENOUS | Status: DC
  Administered 2018-03-29 – 2018-03-31 (×5): 1 g via INTRAVENOUS
  Filled 2018-03-29 (×6): qty 1

## 2018-03-29 MED ORDER — WARFARIN SODIUM 4 MG PO TABS: 4.0000 mg | ORAL_TABLET | Freq: Once | ORAL | Status: DC

## 2018-03-29 MED ORDER — DEXTROSE 5 % IV SOLN: 250.0000 mg | INTRAVENOUS | Status: DC

## 2018-03-29 NOTE — Progress Notes (Signed)
RT NOTE: RT placed patient back on bipap due to sats being low per MD. Patient states his breathing is comfortable and patient is tolerating well. RT will continue to monitor.

## 2018-03-29 NOTE — Progress Notes (Signed)
Pharmacy Antibiotic Note  Travis Bryan is a 48 y.o. male admitted on 03/28/2018 with pneumonia.  Pharmacy has been consulted for Vancomycin/Cefepime dosing. WBC WNL. Noted renal dysfunction.   Plan: Vancomycin 1500 mg IV q36h >>Estimated AUC: 503 Cefepime 1g IV q12h Trend WBC, temp, renal function  F/U infectious work-up Drug levels as indicated   Height: 5\' 10"  (177.8 cm) Weight: 256 lb 13.4 oz (116.5 kg) IBW/kg (Calculated) : 73  Temp (24hrs), Avg:101.7 F (38.7 C), Min:98.8 F (37.1 C), Max:103.2 F (39.6 C)  Recent Labs  Lab 03/28/18 1040 03/28/18 1058 03/28/18 1627 03/29/18 0355  WBC 5.8  --   --  6.0  CREATININE 2.52*  --   --  2.62*  LATICACIDVEN  --  0.80 0.97  --     Estimated Creatinine Clearance: 44.6 mL/min (A) (by C-G formula based on SCr of 2.62 mg/dL (H)).    Allergies  Allergen Reactions  . No Known Allergies    Narda Bonds 03/29/2018 7:40 AM

## 2018-03-29 NOTE — Plan of Care (Signed)
  Problem: Education: Goal: Knowledge of General Education information will improve Description Including pain rating scale, medication(s)/side effects and non-pharmacologic comfort measures Outcome: Not Progressing   Problem: Health Behavior/Discharge Planning: Goal: Ability to manage health-related needs will improve Outcome: Not Progressing   Problem: Clinical Measurements: Goal: Respiratory complications will improve Outcome: Not Progressing   Problem: Nutrition: Goal: Adequate nutrition will be maintained Outcome: Not Progressing   Problem: Coping: Goal: Level of anxiety will decrease Outcome: Not Progressing   Problem: Elimination: Goal: Will not experience complications related to bowel motility Outcome: Not Progressing

## 2018-03-29 NOTE — Progress Notes (Addendum)
[facility] was called and asked if patient would be placed on their watch list.  Patient is on 8L with HFNC and sats in the mid 80's.  Patient was on BiPap for most of the night and oxygen level rarely reached 88%.  Oxygen mostly stayed around 85%.  Patient has also maintained a fever of 103.  The RN administered tylenol twice during this shift.  Ice packs were placed in the groin and underarm areas in hopes of reducing the patient's fever.    Request was made for a cooling blanket.

## 2018-03-29 NOTE — Progress Notes (Signed)
Elink notified of troponin 0.25. Consistent with previous values. Will continue to monitor.

## 2018-03-29 NOTE — Care Management Note (Signed)
Case Management Note  Patient Details  Name: Travis Bryan MRN: [id number] Date of Birth: 1970/08/27  Subjective/Objective:  Patient does not have PCP , no insurance, NCM scheduled hospital follow up for 2/6 at 1:50 at [place] [facility], he can use the CHW clinic for discount medications. Match letter given to patient, with brouchures, these are put on shadow chart, patient is going to the Unit.                   Action/Plan: NCM will follow for transition of care needs.   Expected Discharge Date:  03/30/18               Expected Discharge Plan:  Home/Self Care  In-House Referral:     Discharge planning Services  CM Consult, Follow-up appt scheduled, Medication Assistance, [facility]  Post Acute Care Choice:    Choice offered to:     DME Arranged:    DME Agency:     HH Arranged:    HH Agency:     Status of Service:  In process, will continue to follow  If discussed at Long Length of Stay Meetings, dates discussed:    Additional Comments:  Zenon Mayo, RN 03/29/2018, 5:11 PM

## 2018-03-29 NOTE — Progress Notes (Signed)
RN gave report to 50M RN Tommi Rumps.

## 2018-03-29 NOTE — Progress Notes (Signed)
   Subjective: Overnight patient noted to be febrile to 103 F. Also noted new expiratory wheezing and O2 saturation of 80% despite increasing [place] to 8L and Bipap at 60% FiO2. He was on Bipap overnight, but was taken off this morning because he felt nausea. He received zofran for this. This morning he appears sleepy and reports that he does not feel well. Last night was the first time he's ever used bipap, but he reports that he didn't mind it.   Objective:  Vital signs in last 24 hours: Vitals:   03/29/18 0100 03/29/18 0346 03/29/18 0509 03/29/18 0824  BP:  (!) 134/112  110/66  Pulse: 84 86  71  Resp: 20 (!) 27  (!) 23  Temp:  (!) 103 F (39.4 C)  98.9 F (37.2 C)  TempSrc:  Axillary  Oral  SpO2: 93% (!) 82%  (!) 88%  Weight:   116.5 kg   Height:       Gen: laying comfortably in bed, appears tired and diaphoretic Pulm: On 9L [place], saturation 87-90%, significant bilateral crackles, faint expiratory wheezing, some accessory muscle use CV: RRR, Minimal JVD. Ext: No lower extremity edema  Assessment/Plan:  Active Problems:   Acute respiratory failure with hypoxia [facility])  Travis Bryan is a 48 year old male with HTN, cardiomyopathy with AICD, atrial fibrillation, CHF and diabetes presenting with a one day history of chills and vomiting. He was recently admitted with DKA and pneumonia in 12/19 with a similar presentation.  He reports feeling generally unwell with nausea, dizziness and productive cough. CXR showed bilateral pulmonary infiltrates with pulmonary congestion. On arrival, he was febrile 102.6, hypertensive 151/87 and tachypneic 29.  Oxygen saturation was 80% on room air.  He was put on 5 L nasal cannula and saturating at 94%. Started on vancomycin and zoysn in the ED.   Acute hypoxic respiratory failure likely 2/2 to CAP - Patient is continuing to saturate poorly, he was seen on 9L this morning saturating at 87-90% - He was on BiPAP overnight but this was stopped due to his  nausea - Febrile to 103 overnight - Will switch him back to vancomycin and cefepime for broad spectrum coverage - Blood cultures NGTD - f/u strep pneumo urine antigen - Goal O2 saturation > 90%  - A-a gradient 315.9 mg  Chronic systolic CHF - Continuehydralazine,carvedilol andresumediureticswhen appropriate - Given another dose of IV furosemide 100 mg overnight due to worsening respiratory status and crackles on lung exam - Good urinary output, ~1.3 L - Daily weights - Strict ins and outs - Daily BMPs  Elevated Troponin - Troponin 0.08, likely demand ischemia  Elevated T bili - T bili 2.5, no abdominal pain - Will monitor  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  Rehman, Areeg N, DO 03/29/2018, 10:26 AM Pager: [phone]

## 2018-03-29 NOTE — Progress Notes (Signed)
ANTICOAGULATION CONSULT NOTE - Initial Consult  Pharmacy Consult for warfarin Indication: atrial fibrillation  Allergies  Allergen Reactions  . No Known Allergies     Patient Measurements: Height: 5\' 10"  (177.8 cm) Weight: 256 lb 13.4 oz (116.5 kg) IBW/kg (Calculated) : 73  Vital Signs: Temp: 98.9 F (37.2 C) (01/17 0824) Temp Source: Oral (01/17 0824) BP: 110/66 (01/17 0824) Pulse Rate: 71 (01/17 0824)  Labs: Recent Labs    03/28/18 1040 03/28/18 1540 03/28/18 1846 03/29/18 0355  HGB 12.8*  --   --  12.9*  HCT 41.4  --   --  41.9  PLT 158  --   --  148*  LABPROT  --  30.9*  --  28.4*  INR  --  3.03  --  2.71  CREATININE 2.52*  --   --  2.62*  TROPONINI  --   --  0.08*  --     Estimated Creatinine Clearance: 44.6 mL/min (A) (by C-G formula based on SCr of 2.62 mg/dL (H)).   Medical History: Past Medical History:  Diagnosis Date  . AICD (automatic cardioverter/defibrillator) present   . Atrial flutter ([place])    a. s/p ablation 03/2016  . Chronic systolic CHF (congestive heart failure) ([place])   . Essential hypertension 12/26/2005   Qualifier: Diagnosis of  By: Jerilee Hoh MD, Olam Idler    . HCAP (healthcare-associated pneumonia) 03/28/2018  . History of gout   . Hyperlipidemia   . Hypertension   . Myocardial infarction [facility])    "I think I had one a long long time ago" (03/28/2016)  . NICM (nonischemic cardiomyopathy) ([place])    a. LHC 6/06: pLAD 20, pLCx 20-30; b. Echo 5/15:  EF 15%, diffuse HK, restrictive physiology, trivial AI, trivial MR, mild LAE, moderate RVE, moderately reduced RVSF, moderate RAE, mild to moderate TR, PASP 43 mmHg  . Obesity   . Persistent atrial fibrillation   . Type II diabetes mellitus ([facility])    Assessment: 78 yom presented to the ED with N/V and fever. He is on chronic warfarin for history of afib. INR was slightly above goal at 3.03 on admission yesteday. Lower warfarin dose given last night.  Today's INR is 2.71.  Hgb 12.9 stable, plts  158>148k. No bleeding noted.    (PTA 7.5mg  MWF, 5mg  TTSS) on amiodarone pta/resumed - on reg diet-meals eaten not documented yet.  - potential DDI with doxycycline can increase warf effect--?can doxy be dc'd soon.   Goal of Therapy:  INR 2-3 Monitor platelets by anticoagulation protocol: Yes   Plan:  Warfarin 4mg  PO x 1 tonight, lower dose than PTA 2/2 now on doxycylcine which can increase warfarin effect. Daily INR  Nicole Cella, [place] Clinical Pharmacist [phone] Please check AMION for all [place] phone numbers After 10:00 PM, call [place] [phone] 03/29/2018,9:53 AM

## 2018-03-29 NOTE — Progress Notes (Signed)
RT NOTE: patient placed on dreamstation bipap requiring lots of ressure and 10L oxygent to get sats 85% MD called and made aware ABG pending and switched to v60 at this time Rt will continue to monitor saturations on [id number]% patient haas been in no distress during the changes says he feels better with the bipaps both of them

## 2018-03-29 NOTE — Progress Notes (Addendum)
Patient evaluated again this afternoon. Remains hypoxic on 15L/min HFNC. He is tachypneic and SOB. On PE he has diffuse rhonchi and crackles throughout. We discussed trying BiPAP again. He agrees. Continuing Vanc, Cefepime, and doxycycline. Continue to lasix. Will consult PCCM to further recommendations.

## 2018-03-29 NOTE — Consult Note (Signed)
NAME:  Tramayne Sebesta, MRN:  [id number], DOB:  12/21/1970, LOS: 1 ADMISSION DATE:  03/28/2018, CONSULTATION DATE: 03/29/18 REFERRING MD:  Dr. Rebeca Alert / IMTS, CHIEF COMPLAINT:  SOB, Hypoxia   Brief History   48 y/o M who presented to [facility] on 1/16 with SOB, nausea/vomiting, lightheadedness and feeling unwell. Admitted with concern for pneumonia vs pulmonary edema with Temp max of 104.7, and respiratory failure requiring BiPap.   History of present illness   48 y/o M who presented to [facility] on 1/16 with SOB, nausea/vomiting, lightheadedness and feeling unwell.  The patient was seen at the Nephrology clinic on 1/16 and was found to have fever & low O2 saturations.  He reported a temperature of 102.8 prior to arrival.  Initial labs notable for BUN 22 / sr cr 2.52 (down from 2.85 in 02/2018), BNP 683, troponin 0.12, INR 3.03, WBC 6, hgb 12.9 and platelets 148.  CXR demonstrated cardiomegaly and bilateral infiltrates (edema vs infection).  Influenza A&B initial swab were negative.  Urine strep antigen negative.  The patient was admitted per IMTS and treated empirically for PNA.  PCT was 3.38. Temp's in hospital up to 104. He was also diuresed (50m 1/16, 60 mg 1/17 and additional 1035mpending) with 1.3L UOP since admit but pt remains essentially even since admit.  The discharge weight for the patient was 114 kg, current admit weight 117.9 kg.  ( Weight is up 8 pounds)  PCCM consulted for evaluation of hypoxic respiratory failure.   Past Medical History  DM II Persistent AF s/p Ablation NICM -LVEF 25-30%, s/p AICD  MI  HTN HLD  Significant Hospital Events   1/16  Admit with SOB, fever to 104, n/v, cough  Consults:  PCCM   Procedures:     Significant Diagnostic Tests:   Echo 02/27/2018 Left ventricle: Diffuse hypokinesis worse in the inferior wall.   The cavity size was moderately dilated. Wall thickness was   increased in a pattern of mild LVH. Systolic function was   severely reduced. The  estimated ejection fraction was in the   range of 25% to 30%. Doppler parameters are consistent with both   elevated ventricular end-diastolic filling pressure and elevated   left atrial filling pressure. - Left atrium: The atrium was mildly dilated. - Atrial septum: No defect or patent foramen ovale was identified.  Micro Data:  U. Strep 1/16 >> negative  Flu A/B 1/16 >> negative  BCx2 1/16 >>  UC 1/16 >>  HIV 1/17 >>  RVP 1/17 >>   Antimicrobials:  Vanco 1/16 >>  Cefepime 1/16 >>  Doxycycline 1/16 >>  Interim history/subjective:  Pt has had continue temps up to 104.7, with worsening hypoxemia. He is back on BiPAP .8019mAP of  Objective   Blood pressure 116/81, pulse 80, temperature (!) 101 F (38.3 C), temperature source Oral, resp. rate 20, height 5' 10" (1.778 m), weight 116.5 kg, SpO2 91 %.        Intake/Output Summary (Last 24 hours) at 03/29/2018 1511 Last data filed at 03/29/2018 1208 Gross per 24 hour  Intake 529.47 ml  Output 1800 ml  Net -1270.53 ml   Filed Weights   03/28/18 1033 03/28/18 1705 03/29/18 0509  Weight: 117.9 kg 117.3 kg 116.5 kg    Examination: General:  Lethargic obese male wearing BiPAP, supine in bed HEENT: MM dry, thick neck, No LAD Neuro: Lethargic, follows commands selectively, MAE x 4, A&O x 2  CV: s1s2 rrr, no m/r/g PULM:  Bilateral chest excursion, even respirations, rales per bases, diminished per bases HY:IFOY, non-tender, ND,  bs x 4, active  Extremities: warm/dry, no obvious deformities, trace  edema per lower extremities Skin: no rashes or lesions, warm and dry  Resolved Hospital Problem list      Assessment & Plan:    Acute hypoxemic respiratory failure: given PMH the most likely etiology is pulmonary edema in the setting of acute on chronic HFrEF. Most recent echo with LVEF 25% and CXR with diffuse infiltrates most consistent with pulmonary edema. He is also 4kg from prior discharge in December. He is followed by advanced  heart failure service as an outpatient. However, it is impossible to rule out bacterial/viral pneumonia at this time, especially considering fever and URI symptoms prior to arrival.   Plan: - Arterial Blood Gas now and prn - ABG 1/18/am - Continue BiPAP - CXR in am - Saturation goals are > 90% - Lasix 100 mg IV now, re-evaluate additional diuresis based on response   Will likely need higher dose diuretics given CKD. - Transfer to ICU  - Check respiratory viral panel now.  - Continue empiric antibiotics. Low threshold to initiate tamiflu. - EKG now - Trend Troponins - Minimize sedating medications - Urine Drug Screen - Strict I&O - Trend PCT - NPO  Acute on Chronic Renal Failure Plan -Trend BMET - Replete lytes as needed - Monitor UO - Avoid nephrotoxic medications  Elevated Troponin - Clearing Plan EKG Trend troponins  ID Fever 104.7 WBC of 6 Plan Trend PCT Monitor fever curve and WBC ABX as above   Discussion: CXR with pulmonary edema , ? Infiltrates, EF 25%, elevated BNP with fever and normal WBC. Last ABG with PO2 of 57 on BiPAP ( ? Setting) Concern for continued respiratory decompensation Transfer to ICU to monitor Rest as above  Best practice:  Diet: NPO Pain/Anxiety/Delirium protocol (if indicated): NA VAP protocol (if indicated): NA DVT prophylaxis: Coumadin GI prophylaxis: Per primary team Glucose control: CBG's, SSI Mobility: BR Code Status: Full Family Communication: No family at bedside Disposition:   Labs   CBC: Recent Labs  Lab 03/28/18 1040 03/29/18 0355  WBC 5.8 6.0  NEUTROABS 4.7  --   HGB 12.8* 12.9*  HCT 41.4 41.9  MCV 93.7 95.0  PLT 158 148*    Basic Metabolic Panel: Recent Labs  Lab 03/28/18 1040 03/29/18 0355  NA 138 139  K 3.7 3.7  CL 103 102  CO2 24 28  GLUCOSE 191* 177*  BUN 22* 23*  CREATININE 2.52* 2.62*  CALCIUM 8.8* 8.3*   GFR: Estimated Creatinine Clearance: 44.6 mL/min (A) (by C-G formula based  on SCr of 2.62 mg/dL (H)). Recent Labs  Lab 03/28/18 1040 03/28/18 1058 03/28/18 1549 03/28/18 1627 03/29/18 0355  PROCALCITON  --   --  3.38  --   --   WBC 5.8  --   --   --  6.0  LATICACIDVEN  --  0.80  --  0.97  --     Liver Function Tests: Recent Labs  Lab 03/28/18 1040 03/29/18 0355  AST 30 35  ALT 25 27  ALKPHOS 104 100  BILITOT 2.5* 2.2*  2.5*  PROT 7.7 7.1  ALBUMIN 3.3* 3.1*   No results for input(s): LIPASE, AMYLASE in the last 168 hours. No results for input(s): AMMONIA in the last 168 hours.  ABG    Component Value Date/Time   PHART 7.407 03/29/2018 0045   PCO2ART 43.4 03/29/2018 0045  PO2ART 57.6 (L) 03/29/2018 0045   HCO3 25.9 03/29/2018 0045   TCO2 25 02/25/2018 1257   ACIDBASEDEF 3.0 (H) 02/25/2018 1257   O2SAT 85.5 03/29/2018 0045     Coagulation Profile: Recent Labs  Lab 03/28/18 1540 03/29/18 0355  INR 3.03 2.71    Cardiac Enzymes: Recent Labs  Lab 03/28/18 1846  TROPONINI 0.08*    HbA1C: Hemoglobin A1C  Date/Time Value Ref Range Status  12/26/2017 01:39 PM 6.7 (A) 4.0 - 5.6 % Final  08/23/2017 09:12 AM 7.0 (A) 4.0 - 5.6 % Final   Hgb A1c MFr Bld  Date/Time Value Ref Range Status  02/27/2018 03:32 AM 13.3 (H) 4.8 - 5.6 % Final    Comment:    (NOTE) Pre diabetes:          5.7%-6.4% Diabetes:              >6.4% Glycemic control for   <7.0% adults with diabetes   05/19/2016 03:08 PM 9.0 (H) 4.8 - 5.6 % Final    Comment:    (NOTE)         Pre-diabetes: 5.7 - 6.4         Diabetes: >6.4         Glycemic control for adults with diabetes: <7.0     CBG: Recent Labs  Lab 03/28/18 1704 03/28/18 2103 03/29/18 0824 03/29/18 1205  GLUCAP 185* 199* 196* 189*    Review of Systems:   Unable. lethargic  Past Medical History  He,  has a past medical history of AICD (automatic cardioverter/defibrillator) present, Atrial flutter ([place]), Chronic systolic CHF (congestive heart failure) ([place]), Essential hypertension  (12/26/2005), HCAP (healthcare-associated pneumonia) (03/28/2018), History of gout, Hyperlipidemia, Hypertension, Myocardial infarction ([place]), NICM (nonischemic cardiomyopathy) ([place]), Obesity, Persistent atrial fibrillation, and Type II diabetes mellitus ([place]).   Surgical History    Past Surgical History:  Procedure Laterality Date  . CARDIOVERSION N/A 04/07/2016   Procedure: CARDIOVERSION;  Surgeon: Thayer Headings, MD;  Location: [facility] ENDOSCOPY;  Service: Cardiovascular;  Laterality: N/A;  . CARDIOVERSION N/A 04/11/2016   Procedure: CARDIOVERSION;  Surgeon: Larey Dresser, MD;  Location: [place];  Service: Cardiovascular;  Laterality: N/A;  . ELECTROPHYSIOLOGIC STUDY N/A 03/31/2016   Procedure: A-Flutter Ablation;  Surgeon: Evans Lance, MD;  Location: [place] CV LAB;  Service: Cardiovascular;  Laterality: N/A;  . IMPLANTABLE CARDIOVERTER DEFIBRILLATOR (ICD) GENERATOR CHANGE N/A 08/27/2013   Procedure: ICD GENERATOR CHANGE;  Surgeon: Evans Lance, MD;  [facility] CATH LAB;  Service: Cardiovascular;  Laterality: N/A;  . TEE WITHOUT CARDIOVERSION N/A 03/31/2016   Procedure: TRANSESOPHAGEAL ECHOCARDIOGRAM (TEE);  Surgeon: Larey Dresser, MD;  Location: [place];  Service: Cardiovascular;  Laterality: N/A;     Social History   reports that he quit smoking about 15 years ago. His smoking use included cigars. He quit after 10.00 years of use. He has never used smokeless tobacco. He reports that he does not drink alcohol or use drugs.   Family History   His family history includes Diabetes in his mother; Heart disease in his mother; Hypertension in his mother.   Allergies Allergies  Allergen Reactions  . No Known Allergies      Home Medications  Prior to Admission medications   Medication Sig Start Date End Date Taking? Authorizing Provider  amiodarone (PACERONE) 200 MG tablet Take 1 tablet (200 mg total) by mouth daily. 12/26/17  Yes Carroll Sage, MD  amLODipine  (NORVASC) 5 MG tablet Take 1  tablet (5 mg total) by mouth daily. 08/23/17  Yes Lenore Cordia, MD  carvedilol (COREG) 25 MG tablet TAKE 1 TABLET BY MOUTH TWICE A DAY WITH A MEAL. Patient taking differently: Take 25 mg by mouth 2 (two) times daily with a meal.  12/14/17  Yes Larey Dresser, MD  colchicine 0.6 MG tablet Take 1 tablet (0.6 mg total) by mouth daily. 02/28/18  Yes Thurnell Lose, MD  hydrALAZINE (APRESOLINE) 100 MG tablet TAKE 1 TABLET BY MOUTH 3 TIMES DAILY. Patient taking differently: Take 100 mg by mouth 3 (three) times daily.  11/09/17  Yes Larey Dresser, MD  insulin aspart (NOVOLOG) 100 UNIT/ML injection Substitute to any brand approved.Before each meal 3 times a day, 140-199 - 2 units, 200-250 - 4 units, 251-299 - 6 units,  300-349 - 8 units,  350 or above 10 units. Dispense syringes and needles as needed, Ok to switch to PEN if approved. DX DM2, Code E11.65 Patient taking differently: Inject 10 Units into the skin See admin instructions. Substitute to any brand approved.Before each meal 3 times a day, 140-199 - 2 units, 200-250 - 4 units, 251-299 - 6 units,  300-349 - 8 units,  350 or above 10 units. Dispense syringes and needles as needed, Ok to switch to PEN if approved. DX DM2, Code E11.65 02/28/18  Yes Thurnell Lose, MD  insulin glargine (LANTUS) 100 UNIT/ML injection Inject 0.25 mLs (25 Units total) into the skin 2 (two) times daily. Dispense insulin pen if approved, if not dispense as needed syringes and needles for 1 month supply. Can switch to Levemir. Diagnosis E 11.65. Patient taking differently: Inject 25 Units into the skin at bedtime.  02/28/18  Yes Thurnell Lose, MD  potassium chloride SA (K-DUR,KLOR-CON) 20 MEQ tablet TAKE 2 TABLETS BY MOUTH IN THE MORNING AND 1 TABLET IN THE EVENING Patient taking differently: Take 40 mEq by mouth See admin instructions. TAKE 40 mg  IN THE MORNING AND 20 mg TABLET IN THE EVENING 01/16/18  Yes Larey Dresser, MD    ranolazine (RANEXA) 500 MG 12 hr tablet TAKE 1 TABLET (500 MG TOTAL) BY MOUTH 2 (TWO) TIMES DAILY. 12/19/17  Yes Larey Dresser, MD  torsemide (DEMADEX) 20 MG tablet Take 3 tablets (60 mg total) by mouth daily. 12/26/17  Yes Carroll Sage, MD  warfarin (COUMADIN) 5 MG tablet Take 1 tablet (5 mg total) by mouth daily at 6 PM. Take As Directed by Coumadin Clinic Patient taking differently: Take 5-7.5 mg by mouth daily at 6 PM. Take 7.5 mg on Mon. Wed. Fri Take 5 mg tues, thurs, sat. sunday 03/01/18  Yes Thurnell Lose, MD  allopurinol (ZYLOPRIM) 300 MG tablet Take 300 mg by mouth daily.     [provider]  amoxicillin-clavulanate (AUGMENTIN) 875-125 MG tablet Take 1 tablet by mouth every 12 (twelve) hours. Patient not taking: Reported on 03/28/2018 02/28/18   Thurnell Lose, MD  atorvastatin (LIPITOR) 40 MG tablet Take 1 tablet (40 mg total) by mouth daily. Patient not taking: Reported on 03/28/2018 08/23/17   Lenore Cordia, MD  blood glucose meter kit and supplies KIT Dispense based on patient and insurance preference. Use up to four times daily as directed. (FOR ICD-9 250.00, 250.01). For QAC - HS accuchecks. 02/28/18   Thurnell Lose, MD  calcitRIOL (ROCALTROL) 0.25 MCG capsule Take 0.25 mcg by mouth daily.    [provider]  diclofenac sodium (VOLTAREN) 1 %  GEL Apply 2 g topically 4 (four) times daily. 02/28/18   Thurnell Lose, MD  Insulin Syringe-Needle U-100 25G X 1" 1 ML MISC For 4 times a day insulin SQ, 1 month supply. Diagnosis E11.65 02/28/18   Thurnell Lose, MD  Insulin Syringe-Needle U-100 31G X 15/64" 0.3 ML MISC Use to inject insulin 4 times a day 07/27/16   Sid Falcon, MD  isosorbide mononitrate (IMDUR) 30 MG 24 hr tablet TAKE 3 TABLETS (90 MG TOTAL) BY MOUTH DAILY. 09/07/17 02/25/18  Larey Dresser, MD  magnesium oxide (MAG-OX) 400 (241.3 Mg) MG tablet Take 1 tablet (400 mg total) by mouth daily. 04/15/16   Shirley Friar, PA-C   metolazone (ZAROXOLYN) 2.5 MG tablet TAKE 1 TABLET BY MOUTH AS NEEDED AS DIRECTED Patient taking differently: Take 2.5 mg by mouth as needed (fluid).  12/15/16   Bensimhon, Shaune Pascal, MD     Critical care time:      Magdalen Spatz, AGACNP-BC [facility] Pgr: [phone] 03/29/2018, 3:11 PM

## 2018-03-29 NOTE — Progress Notes (Signed)
Patient was transported to 11M from 2W. Report was received from [facility], RRT. The patient is resting comfortably the BIPAP at this time.

## 2018-03-30 ENCOUNTER — Inpatient Hospital Stay (HOSPITAL_COMMUNITY): Payer: Medicaid Other

## 2018-03-30 LAB — COMPREHENSIVE METABOLIC PANEL
ALT: 32 U/L (ref 0–44)
AST: 57 U/L — ABNORMAL HIGH (ref 15–41)
Albumin: 2.8 g/dL — ABNORMAL LOW (ref 3.5–5.0)
Alkaline Phosphatase: 96 U/L (ref 38–126)
Anion gap: 14 (ref 5–15)
BUN: 39 mg/dL — ABNORMAL HIGH (ref 6–20)
CO2: 22 mmol/L (ref 22–32)
Calcium: 8.4 mg/dL — ABNORMAL LOW (ref 8.9–10.3)
Chloride: 105 mmol/L (ref 98–111)
Creatinine, Ser: 3.82 mg/dL — ABNORMAL HIGH (ref 0.61–1.24)
GFR calc Af Amer: 20 mL/min — ABNORMAL LOW (ref >60)
GFR calc non Af Amer: 18 mL/min — ABNORMAL LOW (ref >60)
Glucose, Bld: 129 mg/dL — ABNORMAL HIGH (ref 70–99)
Potassium: 3.4 mmol/L — ABNORMAL LOW (ref 3.5–5.1)
Sodium: 141 mmol/L (ref 135–145)
Total Bilirubin: 2.3 mg/dL — ABNORMAL HIGH (ref 0.3–1.2)
Total Protein: 7.2 g/dL (ref 6.5–8.1)

## 2018-03-30 LAB — LEGIONELLA PNEUMOPHILA SEROGP 1 UR AG: L. pneumophila Serogp 1 Ur Ag: NEGATIVE

## 2018-03-30 LAB — CBC
HCT: 41.1 % (ref 39.0–52.0)
Hemoglobin: 12.9 g/dL — ABNORMAL LOW (ref 13.0–17.0)
MCH: 29.7 pg (ref 26.0–34.0)
MCHC: 31.4 g/dL (ref 30.0–36.0)
MCV: 94.5 fL (ref 80.0–100.0)
Platelets: 123 10*3/uL — ABNORMAL LOW (ref 150–400)
RBC: 4.35 MIL/uL (ref 4.22–5.81)
RDW: 13.6 % (ref 11.5–15.5)
WBC: 6 10*3/uL (ref 4.0–10.5)
nRBC: 0 % (ref 0.0–0.2)

## 2018-03-30 LAB — GLUCOSE, CAPILLARY
Glucose-Capillary: 115 mg/dL — ABNORMAL HIGH (ref 70–99)
Glucose-Capillary: 135 mg/dL — ABNORMAL HIGH (ref 70–99)
Glucose-Capillary: 128 mg/dL — ABNORMAL HIGH (ref 70–99)
Glucose-Capillary: 107 mg/dL — ABNORMAL HIGH (ref 70–99)

## 2018-03-30 LAB — HIV ANTIBODY (ROUTINE TESTING W REFLEX): HIV Screen 4th Generation wRfx: NONREACTIVE

## 2018-03-30 LAB — PROTIME-INR
INR: 2.27
Prothrombin Time: 24.8 seconds — ABNORMAL HIGH (ref 11.4–15.2)

## 2018-03-30 LAB — EXPECTORATED SPUTUM ASSESSMENT W GRAM STAIN, RFLX TO RESP C

## 2018-03-30 LAB — TROPONIN I: Troponin I: 0.28 ng/mL (ref <0.03)

## 2018-03-30 LAB — PROCALCITONIN: Procalcitonin: 21.06 ng/mL

## 2018-03-30 MED ORDER — WARFARIN SODIUM 7.5 MG PO TABS
7.5000 mg | ORAL_TABLET | Freq: Once | ORAL | Status: AC
  Administered 2018-03-30: 7.5 mg via ORAL
  Filled 2018-03-30: qty 1

## 2018-03-30 MED ORDER — TORSEMIDE 20 MG PO TABS
60.0000 mg | ORAL_TABLET | Freq: Every day | ORAL | Status: DC
  Administered 2018-03-30: 60 mg via ORAL
  Filled 2018-03-30 (×2): qty 3

## 2018-03-30 MED ORDER — COLCHICINE 0.6 MG PO TABS
0.6000 mg | ORAL_TABLET | Freq: Every day | ORAL | Status: DC
  Administered 2018-03-30 – 2018-04-01 (×3): 0.6 mg via ORAL
  Filled 2018-03-30 (×3): qty 1

## 2018-03-30 MED ORDER — CARVEDILOL 25 MG PO TABS: 25.0000 mg | ORAL_TABLET | Freq: Two times a day (BID) | ORAL | Status: DC

## 2018-03-30 MED ORDER — FUROSEMIDE 10 MG/ML IJ SOLN
100.0000 mg | Freq: Once | INTRAVENOUS | Status: AC
  Administered 2018-03-30: 100 mg via INTRAVENOUS
  Filled 2018-03-30: qty 10

## 2018-03-30 MED ORDER — ISOSORBIDE MONONITRATE ER 60 MG PO TB24
90.0000 mg | ORAL_TABLET | Freq: Every day | ORAL | Status: DC
  Filled 2018-03-30 (×2): qty 1

## 2018-03-30 MED ORDER — MAGNESIUM OXIDE 400 (241.3 MG) MG PO TABS
400.0000 mg | ORAL_TABLET | Freq: Every day | ORAL | Status: DC
  Administered 2018-03-31 – 2018-04-01 (×2): 400 mg via ORAL
  Filled 2018-03-30 (×4): qty 1

## 2018-03-30 MED ORDER — WHITE PETROLATUM EX OINT
TOPICAL_OINTMENT | CUTANEOUS | Status: AC
  Administered 2018-03-30: 0.2
  Filled 2018-03-30: qty 28.35

## 2018-03-30 MED ORDER — AMIODARONE HCL 200 MG PO TABS: 200.0000 mg | ORAL_TABLET | Freq: Every day | ORAL | Status: DC

## 2018-03-30 MED ORDER — ALBUTEROL SULFATE (2.5 MG/3ML) 0.083% IN NEBU
2.5000 mg | INHALATION_SOLUTION | RESPIRATORY_TRACT | Status: DC | PRN
  Administered 2018-03-30 – 2018-04-11 (×5): 2.5 mg via RESPIRATORY_TRACT
  Filled 2018-03-30 (×5): qty 3

## 2018-03-30 NOTE — Progress Notes (Signed)
Patient placed on 15L HFNC. O2 sats are 95%. Patient given water and applesauce. Will continue to monitor.

## 2018-03-30 NOTE — Progress Notes (Signed)
ANTICOAGULATION CONSULT NOTE - Initial Consult  Pharmacy Consult for Warfarin Indication: atrial fibrillation  Allergies  Allergen Reactions  . No Known Allergies    Patient Measurements: Height: 5\' 10"  (177.8 cm) Weight: 252 lb 3.2 oz (114.4 kg) IBW/kg (Calculated) : 73  Vital Signs: Temp: 102.5 F (39.2 C) (01/18 1132) Temp Source: Axillary (01/18 1132) BP: 108/67 (01/18 1300) Pulse Rate: 77 (01/18 1300)  Labs: Recent Labs    03/28/18 1040 03/28/18 1540  03/29/18 0355 03/29/18 1627 03/29/18 2134 03/30/18 0630  HGB 12.8*  --   --  12.9*  --   --  12.9*  HCT 41.4  --   --  41.9  --   --  41.1  PLT 158  --   --  148*  --   --  123*  LABPROT  --  30.9*  --  28.4*  --   --  24.8*  INR  --  3.03  --  2.71  --   --  2.27  CREATININE 2.52*  --   --  2.62*  --   --  3.82*  TROPONINI  --   --    < >  --  0.18* 0.25* 0.28*   < > = values in this interval not displayed.   Estimated Creatinine Clearance: 30.3 mL/min (A) (by C-G formula based on SCr of 3.82 mg/dL (H)).  Assessment: 6 yoM presenting with NV and fever, h/o afib on warfarin PTA. Pharmacy consulted to continue warfarin dosing upon admission. INR 2.27 today, down from 2.71. Patient did not receive ordered warfarin 4mg  yesterday.  Hgb stable 12.9, pltc down slightly to 123. No bleeding noted. Noted interaction with amiodarone, resumed from PTA, and doxycycline to continue through 1/19 - may increase effect of warfarin.     PTA warfarin dose: 7.5mg  MWF, 5mg  TTSS (admit INR 3.03)   Goal of Therapy:  INR 2-3 Monitor platelets by anticoagulation protocol: Yes   Plan:  Warfarin 7.5mg  PO x1 Daily INR and CBC Monitor s/sx of bleeding  Everard Interrante N. Gerarda Fraction, PharmD, [place] PGY2 Infectious Diseases Pharmacy Resident Phone: [phone] 03/30/2018,2:39 PM

## 2018-03-30 NOTE — Progress Notes (Signed)
Patient on 15 high flow cannula at this time, sp02=92% will continue to monitor and place back on Bipap later this evening.

## 2018-03-30 NOTE — Progress Notes (Signed)
[name] temp is 103.8 rectally. 650 mg PO tylenol given. Cooling blanket under patient and is on. [place] notified. Will continue to monitor.

## 2018-03-30 NOTE — Progress Notes (Signed)
NAME:  Travis Bryan, MRN:  [id number], DOB:  March 02, 1971, LOS: 2 ADMISSION DATE:  03/28/2018, CONSULTATION DATE:  03/28/2018 REFERRING MD:  Graciella Freer, CHIEF COMPLAINT:  Respiratory failure.   HPI/course in hospital  48 year old man with NCIM. Present with fever and nausea and vomiting. Increase in weight and dyspnea. Started on BiPAP and antibiotics for suspicion of pneumonia with superimposed decompensated heart failure.  Past Medical History  He,  has a past medical history of AICD (automatic cardioverter/defibrillator) present, Atrial flutter ([place]), Chronic systolic CHF (congestive heart failure) ([place]), HCAP (healthcare-associated pneumonia) (03/28/2018), History of gout, Hyperlipidemia, Hypertension, Myocardial infarction ([place]), NICM (nonischemic cardiomyopathy) ([place]), Obesity, Persistent atrial fibrillation, and Type II diabetes mellitus ([place]).  Past Surgical History:  Procedure Laterality Date  . CARDIOVERSION N/A 04/07/2016   Procedure: CARDIOVERSION;  Surgeon: Thayer Headings, MD;  Location: [facility] ENDOSCOPY;  Service: Cardiovascular;  Laterality: N/A;  . CARDIOVERSION N/A 04/11/2016   Procedure: CARDIOVERSION;  Surgeon: Larey Dresser, MD;  Location: [place];  Service: Cardiovascular;  Laterality: N/A;  . ELECTROPHYSIOLOGIC STUDY N/A 03/31/2016   Procedure: A-Flutter Ablation;  Surgeon: Evans Lance, MD;  Location: [place] CV LAB;  Service: Cardiovascular;  Laterality: N/A;  . IMPLANTABLE CARDIOVERTER DEFIBRILLATOR (ICD) GENERATOR CHANGE N/A 08/27/2013   Procedure: ICD GENERATOR CHANGE;  Surgeon: Evans Lance, MD;  Location: [facility] CATH LAB;  Service: Cardiovascular;  Laterality: N/A;  . TEE WITHOUT CARDIOVERSION N/A 03/31/2016   Procedure: TRANSESOPHAGEAL ECHOCARDIOGRAM (TEE);  Surgeon: Larey Dresser, MD;  Location: [place];  Service: Cardiovascular;  Laterality: N/A;      Interim history/subjective:  BiPAP stopped this morning.  Patient requesting to eat.  Objective     Blood pressure (!) 130/106, pulse 78, temperature (!) 101.4 F (38.6 C), temperature source Rectal, resp. rate (!) 22, height 5\' 10"  (1.778 m), weight 114.4 kg, SpO2 91 %.    FiO2 (%):  [80 %-100 %] 100 %   Intake/Output Summary (Last 24 hours) at 03/30/2018 1019 Last data filed at 03/30/2018 0600 Gross per 24 hour  Intake 803.12 ml  Output 1375 ml  Net -571.88 ml   Filed Weights   03/28/18 1705 03/29/18 0509 03/30/18 0500  Weight: 117.3 kg 116.5 kg 114.4 kg    Examination: Physical Exam   Ancillary tests (personally reviewed)  CBC: Recent Labs  Lab 03/28/18 1040 03/29/18 0355 03/30/18 0630  WBC 5.8 6.0 6.0  NEUTROABS 4.7  --   --   HGB 12.8* 12.9* 12.9*  HCT 41.4 41.9 41.1  MCV 93.7 95.0 94.5  PLT 158 148* 123*    Basic Metabolic Panel: Recent Labs  Lab 03/28/18 1040 03/29/18 0355 03/29/18 1627 03/30/18 0630  NA 138 139  --  141  K 3.7 3.7  --  3.4*  CL 103 102  --  105  CO2 24 28  --  22  GLUCOSE 191* 177*  --  129*  BUN 22* 23*  --  39*  CREATININE 2.52* 2.62*  --  3.82*  CALCIUM 8.8* 8.3*  --  8.4*  MG  --   --  1.5*  --   PHOS  --   --  2.5  --    GFR: Estimated Creatinine Clearance: 30.3 mL/min (A) (by C-G formula based on SCr of 3.82 mg/dL (H)). Recent Labs  Lab 03/28/18 1040 03/28/18 1058 03/28/18 1549 03/28/18 1627 03/29/18 0355 03/29/18 1627 03/30/18 0630  PROCALCITON  --   --  3.38  --   --  16.41 21.06  WBC 5.8  --   --   --  6.0  --  6.0  LATICACIDVEN  --  0.80  --  0.97  --   --   --     Liver Function Tests: Recent Labs  Lab 03/28/18 1040 03/29/18 0355 03/30/18 0630  AST 30 35 57*  ALT 25 27 32  ALKPHOS 104 100 96  BILITOT 2.5* 2.2*  2.5* 2.3*  PROT 7.7 7.1 7.2  ALBUMIN 3.3* 3.1* 2.8*   No results for input(s): LIPASE, AMYLASE in the last 168 hours. No results for input(s): AMMONIA in the last 168 hours.  ABG    Component Value Date/Time   PHART 7.344 (L) 03/29/2018 1610   PCO2ART 52.7 (H) 03/29/2018 1610    PO2ART 74.3 (L) 03/29/2018 1610   HCO3 26.8 03/29/2018 1610   TCO2 25 02/25/2018 1257   ACIDBASEDEF 3.0 (H) 02/25/2018 1257   O2SAT 90.8 03/29/2018 1610     Coagulation Profile: Recent Labs  Lab 03/28/18 1540 03/29/18 0355 03/30/18 0630  INR 3.03 2.71 2.27    Cardiac Enzymes: Recent Labs  Lab 03/28/18 1846 03/29/18 1627 03/29/18 2134 03/30/18 0630  TROPONINI 0.08* 0.18* 0.25* 0.28*    HbA1C: Hemoglobin A1C  Date/Time Value Ref Range Status  12/26/2017 01:39 PM 6.7 (A) 4.0 - 5.6 % Final  08/23/2017 09:12 AM 7.0 (A) 4.0 - 5.6 % Final   Hgb A1c MFr Bld  Date/Time Value Ref Range Status  02/27/2018 03:32 AM 13.3 (H) 4.8 - 5.6 % Final    Comment:    (NOTE) Pre diabetes:          5.7%-6.4% Diabetes:              >6.4% Glycemic control for   <7.0% adults with diabetes   05/19/2016 03:08 PM 9.0 (H) 4.8 - 5.6 % Final    Comment:    (NOTE)         Pre-diabetes: 5.7 - 6.4         Diabetes: >6.4         Glycemic control for adults with diabetes: <7.0     CBG: Recent Labs  Lab 03/29/18 1205 03/29/18 1731 03/29/18 1815 03/29/18 1928 03/30/18 0720  GLUCAP 189* 149* 129* 109* 115*   CXR 1/18: personally reviewed -  Evolving bilateral interstitial markings. Evolving consolidation of RUL.  Microbiology: Metapneumovirus on RVP, MRSA PCR negative.  Assessment & Plan:   Critically ill due to acute hypoxic respiratory failure requiring NIV with BiPAP Features consistent with CAP with superimposed decompensated systolic heart failure. Consolidation appears quite dense for viral pneumonia, suspect bacterial suprainfection. DM with poor glycemic control Chronic renal insufficiency  PLAN  Continue antibiotics for 7 days to treat CAP Stop Vancomycin as MRSA swab negative. Trial off BiPAP Remains at high risk for intubation. Continue HF therapy and continue gentle diuresis.   Best practice:  Diet: progressive diet once on nasal cannula Pain/Anxiety/Delirium  protocol (if indicated): none required VAP protocol (if indicated): N/A DVT prophylaxis: continue warfarin GI prophylaxis: N/a Glucose control: adequate control with basal ssi Mobility: progressive mobilization. Code Status: full Family Communication: None present. Disposition: ICU    Critical care time: n/a    Kipp Brood, MD [facility] ICU Physician [place]  Pager: [phone] Mobile: [phone] After hours: [phone].  03/30/2018, 10:19 AM

## 2018-03-31 DIAGNOSIS — I5043 Acute on chronic combined systolic (congestive) and diastolic (congestive) heart failure: Secondary | ICD-10-CM

## 2018-03-31 LAB — COMPREHENSIVE METABOLIC PANEL
ALT: 32 U/L (ref 0–44)
AST: 60 U/L — ABNORMAL HIGH (ref 15–41)
Albumin: 2.5 g/dL — ABNORMAL LOW (ref 3.5–5.0)
Alkaline Phosphatase: 89 U/L (ref 38–126)
Anion gap: 11 (ref 5–15)
BUN: 58 mg/dL — ABNORMAL HIGH (ref 6–20)
CO2: 25 mmol/L (ref 22–32)
Calcium: 8 mg/dL — ABNORMAL LOW (ref 8.9–10.3)
Chloride: 102 mmol/L (ref 98–111)
Creatinine, Ser: 6.52 mg/dL — ABNORMAL HIGH (ref 0.61–1.24)
GFR calc Af Amer: 11 mL/min — ABNORMAL LOW (ref >60)
GFR calc non Af Amer: 9 mL/min — ABNORMAL LOW (ref >60)
Glucose, Bld: 165 mg/dL — ABNORMAL HIGH (ref 70–99)
Potassium: 3.5 mmol/L (ref 3.5–5.1)
Sodium: 138 mmol/L (ref 135–145)
Total Bilirubin: 1.7 mg/dL — ABNORMAL HIGH (ref 0.3–1.2)
Total Protein: 6.4 g/dL — ABNORMAL LOW (ref 6.5–8.1)

## 2018-03-31 LAB — PROCALCITONIN: Procalcitonin: 30.31 ng/mL

## 2018-03-31 LAB — CBC
HCT: 36.8 % — ABNORMAL LOW (ref 39.0–52.0)
Hemoglobin: 11.3 g/dL — ABNORMAL LOW (ref 13.0–17.0)
MCH: 29 pg (ref 26.0–34.0)
MCHC: 30.7 g/dL (ref 30.0–36.0)
MCV: 94.6 fL (ref 80.0–100.0)
Platelets: DECREASED 10*3/uL (ref 150–400)
RBC: 3.89 MIL/uL — ABNORMAL LOW (ref 4.22–5.81)
RDW: 13.7 % (ref 11.5–15.5)
WBC: 4.5 10*3/uL (ref 4.0–10.5)
nRBC: 0 % (ref 0.0–0.2)

## 2018-03-31 LAB — GLUCOSE, CAPILLARY
Glucose-Capillary: 204 mg/dL — ABNORMAL HIGH (ref 70–99)
Glucose-Capillary: 109 mg/dL — ABNORMAL HIGH (ref 70–99)
Glucose-Capillary: 104 mg/dL — ABNORMAL HIGH (ref 70–99)
Glucose-Capillary: 97 mg/dL (ref 70–99)

## 2018-03-31 LAB — MAGNESIUM: Magnesium: 1.8 mg/dL (ref 1.7–2.4)

## 2018-03-31 LAB — LACTIC ACID, PLASMA: Lactic Acid, Venous: 0.9 mmol/L (ref 0.5–1.9)

## 2018-03-31 LAB — PROTIME-INR
INR: 2.29
Prothrombin Time: 24.9 seconds — ABNORMAL HIGH (ref 11.4–15.2)

## 2018-03-31 MED ORDER — SODIUM CHLORIDE 0.9 % IV SOLN
2.0000 g | INTRAVENOUS | Status: DC
  Administered 2018-04-01: 2 g via INTRAVENOUS
  Filled 2018-03-31: qty 2

## 2018-03-31 MED ORDER — FUROSEMIDE 10 MG/ML IJ SOLN
120.0000 mg | Freq: Once | INTRAVENOUS | Status: AC
  Administered 2018-03-31: 120 mg via INTRAVENOUS
  Filled 2018-03-31: qty 10

## 2018-03-31 MED ORDER — POTASSIUM CHLORIDE 10 MEQ/100ML IV SOLN: 10.0000 meq | INTRAVENOUS | Status: DC

## 2018-03-31 MED ORDER — POTASSIUM CHLORIDE 10 MEQ/100ML IV SOLN
10.0000 meq | INTRAVENOUS | Status: AC
  Administered 2018-03-31 (×4): 10 meq via INTRAVENOUS
  Filled 2018-03-31 (×4): qty 100

## 2018-03-31 MED ORDER — METOLAZONE 2.5 MG PO TABS
2.5000 mg | ORAL_TABLET | Freq: Once | ORAL | Status: AC
  Administered 2018-03-31: 2.5 mg via ORAL
  Filled 2018-03-31: qty 1

## 2018-03-31 MED ORDER — WARFARIN SODIUM 7.5 MG PO TABS
7.5000 mg | ORAL_TABLET | Freq: Once | ORAL | Status: AC
  Administered 2018-03-31: 7.5 mg via ORAL
  Filled 2018-03-31: qty 1

## 2018-03-31 NOTE — Progress Notes (Signed)
[place] for Warfarin Indication: atrial fibrillation  Allergies  Allergen Reactions  . No Known Allergies    Patient Measurements: Height: 5\' 10"  (177.8 cm) Weight: 251 lb 15.8 oz (114.3 kg) IBW/kg (Calculated) : 73  Vital Signs: Temp: 101.7 F (38.7 C) (01/19 0725) Temp Source: Axillary (01/19 0725) BP: 100/51 (01/19 0828) Pulse Rate: 72 (01/19 0951)  Labs: Recent Labs    03/28/18 1040  03/29/18 0355 03/29/18 1627 03/29/18 2134 03/30/18 0630 03/31/18 0358  HGB 12.8*  --  12.9*  --   --  12.9*  --   HCT 41.4  --  41.9  --   --  41.1  --   PLT 158  --  148*  --   --  123*  --   LABPROT  --    < > 28.4*  --   --  24.8* 24.9*  INR  --    < > 2.71  --   --  2.27 2.29  CREATININE 2.52*  --  2.62*  --   --  3.82*  --   TROPONINI  --    < >  --  0.18* 0.25* 0.28*  --    < > = values in this interval not displayed.   Estimated Creatinine Clearance: 30.3 mL/min (A) (by C-G formula based on SCr of 3.82 mg/dL (H)).  Assessment: 63 yoM presenting with NV and fever, h/o afib on warfarin PTA. Pharmacy consulted to continue warfarin dosing upon admission. INR 2.29 today. Of note, patient did not receive ordered warfarin dose on 1/17. Hgb stable 12.9, pltc down slightly to 123. No bleeding noted. Noted interaction with amiodarone, resumed from PTA, and doxycycline which was discontinued 1/18.     PTA warfarin dose: 7.5mg  MWF, 5mg  TTSS (admit INR 3.03)   Goal of Therapy:  INR 2-3 Monitor platelets by anticoagulation protocol: Yes   Plan:  Warfarin 7.5mg  PO x1 Daily INR Monitor s/sx of bleeding  Erin N. Gerarda Fraction, PharmD, [place] PGY2 Infectious Diseases Pharmacy Resident Phone: [phone] 03/31/2018,10:25 AM

## 2018-03-31 NOTE — Progress Notes (Signed)
Pharmacy Antibiotic Note  Travis Bryan is a 48 y.o. male admitted on 03/28/2018 with pneumonia. Pharmacy has been consulted for cefepime dosing. WBC WNL, continues to be febrile - currently 101.7, but curve appears to be trending downward (tmax 104.4). Sharp rise in Scr today to 6.52 (BL ~2.5), estimated CrCl ~18 mL/min.  Plan: Adjust to cefepime 2g IV q24h F/u clinical status, renal function, LOT  Height: 5\' 10"  (177.8 cm) Weight: 251 lb 15.8 oz (114.3 kg) IBW/kg (Calculated) : 73  Temp (24hrs), Avg:101.6 F (38.7 C), Min:101 F (38.3 C), Max:102.4 F (39.1 C)  Recent Labs  Lab 03/28/18 1040 03/28/18 1058 03/28/18 1627 03/29/18 0355 03/30/18 0630 03/31/18 1024  WBC 5.8  --   --  6.0 6.0 4.5  CREATININE 2.52*  --   --  2.62* 3.82* 6.52*  LATICACIDVEN  --  0.80 0.97  --   --  0.9    Estimated Creatinine Clearance: 17.7 mL/min (A) (by C-G formula based on SCr of 6.52 mg/dL (H)).    Allergies  Allergen Reactions  . No Known Allergies     Antimicrobials this admission: Cefepime 1/16>> Vancomycin 1/16 >> 1/18 Doxycycline 1/16 >> 1/18 Zosyn 1/16 x1 Ceftriaxone 1/16 x1   Microbiology results: 1/16 BCx x2: NG x2 1/16 UCx: NG final 1/17 MRSA PCR: neg 1/17 RVP: +metopneumovirus 1/18 sputum Cx: not acceptable for testing  Thank you for allowing pharmacy to be a part of this patient's care.  Mila Merry Gerarda Fraction, PharmD, [place] PGY2 Infectious Diseases Pharmacy Resident Phone: [phone] 03/31/2018 12:12 PM

## 2018-03-31 NOTE — Progress Notes (Addendum)
NAME:  Travis Bryan, MRN:  [id number], DOB:  03-May-1970, LOS: 3 ADMISSION DATE:  03/28/2018, CONSULTATION DATE:  03/28/2018 REFERRING MD:  Graciella Freer, CHIEF COMPLAINT:  Respiratory failure.   HPI/course in hospital  48 year old man with NICM, TIIDM, Afib, systolic HF. Present with fever and nausea and vomiting. Increase in weight and dyspnea. Started on BiPAP and antibiotics for suspicion of pneumonia with superimposed decompensated heart failure.  Past Medical History  He,  has a past medical history of AICD (automatic cardioverter/defibrillator) present, Atrial flutter ([place]), Chronic systolic CHF (congestive heart failure) ([place]), HCAP (healthcare-associated pneumonia) (03/28/2018), History of gout, Hyperlipidemia, Hypertension, Myocardial infarction ([place]), NICM (nonischemic cardiomyopathy) ([place]), Obesity, Persistent atrial fibrillation, and Type II diabetes mellitus ([place]).  Past Surgical History:  Procedure Laterality Date  . CARDIOVERSION N/A 04/07/2016   Procedure: CARDIOVERSION;  Surgeon: Thayer Headings, MD;  Location: [facility] ENDOSCOPY;  Service: Cardiovascular;  Laterality: N/A;  . CARDIOVERSION N/A 04/11/2016   Procedure: CARDIOVERSION;  Surgeon: Larey Dresser, MD;  Location: [place];  Service: Cardiovascular;  Laterality: N/A;  . ELECTROPHYSIOLOGIC STUDY N/A 03/31/2016   Procedure: A-Flutter Ablation;  Surgeon: Evans Lance, MD;  Location: [place] CV LAB;  Service: Cardiovascular;  Laterality: N/A;  . IMPLANTABLE CARDIOVERTER DEFIBRILLATOR (ICD) GENERATOR CHANGE N/A 08/27/2013   Procedure: ICD GENERATOR CHANGE;  Surgeon: Evans Lance, MD;  Location: [facility] CATH LAB;  Service: Cardiovascular;  Laterality: N/A;  . TEE WITHOUT CARDIOVERSION N/A 03/31/2016   Procedure: TRANSESOPHAGEAL ECHOCARDIOGRAM (TEE);  Surgeon: Larey Dresser, MD;  Location: [place];  Service: Cardiovascular;  Laterality: N/A;      Interim history/subjective:  On bipap overnight. Now on HFNC and states he is  doing well, not feeling SOB. States he is not urinating much.   Objective   Blood pressure (!) 100/51, pulse 75, temperature (!) 101.7 F (38.7 C), temperature source Axillary, resp. rate (!) 22, height 5\' 10"  (1.778 m), weight 114.3 kg, SpO2 100 %.    FiO2 (%):  [100 %] 100 %   Intake/Output Summary (Last 24 hours) at 03/31/2018 0944 Last data filed at 03/31/2018 0600 Gross per 24 hour  Intake 860.07 ml  Output 400 ml  Net 460.07 ml   Filed Weights   03/29/18 0509 03/30/18 0500 03/31/18 0447  Weight: 116.5 kg 114.4 kg 114.3 kg    Examination: Physical Exam  Constitution: NAD, obese, supine in bed HENT: on HFNC, poor dentition Eyes: no scleral icterus, eom intact Cardio: distant heart sounds, RRR Respiratory: decreased breath sounds, course breath sounds, rhonchi, no wheezing Abdominal: obese, soft, NTTP, +BS MSK: no edema, moving all extremities GU: no foley  Neuro: a&o, cooperative Skin: c/d/i    Ancillary tests (personally reviewed)  CBC: Recent Labs  Lab 03/28/18 1040 03/29/18 0355 03/30/18 0630  WBC 5.8 6.0 6.0  NEUTROABS 4.7  --   --   HGB 12.8* 12.9* 12.9*  HCT 41.4 41.9 41.1  MCV 93.7 95.0 94.5  PLT 158 148* 123*    Basic Metabolic Panel: Recent Labs  Lab 03/28/18 1040 03/29/18 0355 03/29/18 1627 03/30/18 0630  NA 138 139  --  141  K 3.7 3.7  --  3.4*  CL 103 102  --  105  CO2 24 28  --  22  GLUCOSE 191* 177*  --  129*  BUN 22* 23*  --  39*  CREATININE 2.52* 2.62*  --  3.82*  CALCIUM 8.8* 8.3*  --  8.4*  MG  --   --  1.5*  --   PHOS  --   --  2.5  --    GFR: Estimated Creatinine Clearance: 30.3 mL/min (A) (by C-G formula based on SCr of 3.82 mg/dL (H)). Recent Labs  Lab 03/28/18 1040 03/28/18 1058 03/28/18 1549 03/28/18 1627 03/29/18 0355 03/29/18 1627 03/30/18 0630 03/31/18 0358  PROCALCITON  --   --  3.38  --   --  16.41 21.06 30.31  WBC 5.8  --   --   --  6.0  --  6.0  --   LATICACIDVEN  --  0.80  --  0.97  --   --   --   --       Liver Function Tests: Recent Labs  Lab 03/28/18 1040 03/29/18 0355 03/30/18 0630  AST 30 35 57*  ALT 25 27 32  ALKPHOS 104 100 96  BILITOT 2.5* 2.2*  2.5* 2.3*  PROT 7.7 7.1 7.2  ALBUMIN 3.3* 3.1* 2.8*   No results for input(s): LIPASE, AMYLASE in the last 168 hours. No results for input(s): AMMONIA in the last 168 hours.  ABG    Component Value Date/Time   PHART 7.344 (L) 03/29/2018 1610   PCO2ART 52.7 (H) 03/29/2018 1610   PO2ART 74.3 (L) 03/29/2018 1610   HCO3 26.8 03/29/2018 1610   TCO2 25 02/25/2018 1257   ACIDBASEDEF 3.0 (H) 02/25/2018 1257   O2SAT 90.8 03/29/2018 1610     Coagulation Profile: Recent Labs  Lab 03/28/18 1540 03/29/18 0355 03/30/18 0630 03/31/18 0358  INR 3.03 2.71 2.27 2.29    Cardiac Enzymes: Recent Labs  Lab 03/28/18 1846 03/29/18 1627 03/29/18 2134 03/30/18 0630  TROPONINI 0.08* 0.18* 0.25* 0.28*    HbA1C: Hemoglobin A1C  Date/Time Value Ref Range Status  12/26/2017 01:39 PM 6.7 (A) 4.0 - 5.6 % Final  08/23/2017 09:12 AM 7.0 (A) 4.0 - 5.6 % Final   Hgb A1c MFr Bld  Date/Time Value Ref Range Status  02/27/2018 03:32 AM 13.3 (H) 4.8 - 5.6 % Final    Comment:    (NOTE) Pre diabetes:          5.7%-6.4% Diabetes:              >6.4% Glycemic control for   <7.0% adults with diabetes   05/19/2016 03:08 PM 9.0 (H) 4.8 - 5.6 % Final    Comment:    (NOTE)         Pre-diabetes: 5.7 - 6.4         Diabetes: >6.4         Glycemic control for adults with diabetes: <7.0     CBG: Recent Labs  Lab 03/30/18 0720 03/30/18 1121 03/30/18 1538 03/30/18 2212 03/31/18 0723  GLUCAP 115* 135* 128* 107* 204*   CXR  1/18:  -  Evolving bilateral interstitial markings. Evolving consolidation of RUL.  Microbiology: Metapneumovirus on RVP, MRSA PCR negative.  Assessment & Plan:  Critically ill due to acute hypoxic respiratory failure requiring NIV with BiPAP, CAP with superimposed decompensated systolic heart failure. Suspect  bacterial suprainfection although sputum yesterday insufficient.   Acute Hypoxic Respiratory Failure: Multifactorial due to bilateral pneumonia and pulmonary edema 2/2 HF exacerbation. RVP + for metapneumovirus, procalcitonin also climbing. Suspect superimposed bacterial PNA.  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 Aundra Dubin outpatient   AKI on CKD - I&Os -BMP this am ordered -am labs  Hypokalemia: due to lasix  - replete prn   TIIDM -10U lantus qam -25U lantus qhs -ssi moderate   Best practice:  Diet: NPO Pain/Anxiety/Delirium protocol (if indicated): none required VAP protocol (if indicated): N/A DVT prophylaxis: continue warfarin GI prophylaxis: N/a Glucose control: adequate control with basal ssi Mobility: progressive mobilization. Code Status: full Family Communication: None present. Disposition: ICU    Critical care time: n/a    Marty Heck, DO 03/31/2018, 9:52 AM Pager: [phone]  03/31/2018, 9:44 AM

## 2018-03-31 NOTE — Consult Note (Signed)
Cardiology Consultation:   Patient ID: [facility] MRN: [id number]; DOB: [birth year]  Admit date: 03/28/2018 Date of Consult: 03/31/2018  Primary Care Provider: Carroll Sage, MD Primary Cardiologist:  Aundra Dubin  Primary Electrophysiologist:  None     Patient Profile:   [facility] is a 48 y.o. male with a hx of chronic systolic congestive heart failure, atrial fibrillation, chronic kidney disease, hypertension, diabetes mellitus who is being seen today for the evaluation of acute on chronic systolic congestive heart failure at the request of  Dr. Rebeca Alert .  History of Present Illness:   Travis [facility] is a 48 year old gentleman who is followed by Dr. Algernon Huxley.  He has a long history of chronic systolic congestive heart failure.  Was admitted several days ago with bilateral community-acquired pneumonia.  He also has had worsening congestive heart failure.  His procalcitonin level is still greater than 30. He states that he has been compliant with all his medications at home.    Past Medical History:  Diagnosis Date  . AICD (automatic cardioverter/defibrillator) present   . Atrial flutter ([place])    a. s/p ablation 03/2016  . Chronic systolic CHF (congestive heart failure) ([place])   . HCAP (healthcare-associated pneumonia) 03/28/2018  . History of gout   . Hyperlipidemia   . Hypertension   . Myocardial infarction [facility])    "I think I had one a long long time ago" (03/28/2016)  . NICM (nonischemic cardiomyopathy) ([place])    a. LHC 6/06: pLAD 20, pLCx 20-30; b. Echo 5/15:  EF 15%, diffuse HK, restrictive physiology, trivial AI, trivial MR, mild LAE, moderate RVE, moderately reduced RVSF, moderate RAE, mild to moderate TR, PASP 43 mmHg  . Obesity   . Persistent atrial fibrillation   . Type II diabetes mellitus ([place])     Past Surgical History:  Procedure Laterality Date  . CARDIOVERSION N/A 04/07/2016   Procedure: CARDIOVERSION;  Surgeon: Thayer Headings, MD;  Location: [facility] ENDOSCOPY;   Service: Cardiovascular;  Laterality: N/A;  . CARDIOVERSION N/A 04/11/2016   Procedure: CARDIOVERSION;  Surgeon: Larey Dresser, MD;  Location: [place];  Service: Cardiovascular;  Laterality: N/A;  . ELECTROPHYSIOLOGIC STUDY N/A 03/31/2016   Procedure: A-Flutter Ablation;  Surgeon: Evans Lance, MD;  Location: [place] CV LAB;  Service: Cardiovascular;  Laterality: N/A;  . IMPLANTABLE CARDIOVERTER DEFIBRILLATOR (ICD) GENERATOR CHANGE N/A 08/27/2013   Procedure: ICD GENERATOR CHANGE;  Surgeon: Evans Lance, MD;  Location: [facility] CATH LAB;  Service: Cardiovascular;  Laterality: N/A;  . TEE WITHOUT CARDIOVERSION N/A 03/31/2016   Procedure: TRANSESOPHAGEAL ECHOCARDIOGRAM (TEE);  Surgeon: Larey Dresser, MD;  Location: [place];  Service: Cardiovascular;  Laterality: N/A;     Home Medications:  Prior to Admission medications   Medication Sig Start Date End Date Taking? Authorizing Provider  amiodarone (PACERONE) 200 MG tablet Take 1 tablet (200 mg total) by mouth daily. 12/26/17  Yes Carroll Sage, MD  amLODipine (NORVASC) 5 MG tablet Take 1 tablet (5 mg total) by mouth daily. 08/23/17  Yes Lenore Cordia, MD  carvedilol (COREG) 25 MG tablet TAKE 1 TABLET BY MOUTH TWICE A DAY WITH A MEAL. Patient taking differently: Take 25 mg by mouth 2 (two) times daily with a meal.  12/14/17  Yes Larey Dresser, MD  colchicine 0.6 MG tablet Take 1 tablet (0.6 mg total) by mouth daily. 02/28/18  Yes Thurnell Lose, MD  hydrALAZINE (APRESOLINE) 100 MG tablet TAKE 1 TABLET BY MOUTH 3 TIMES DAILY. Patient  taking differently: Take 100 mg by mouth 3 (three) times daily.  11/09/17  Yes Larey Dresser, MD  insulin aspart (NOVOLOG) 100 UNIT/ML injection Substitute to any brand approved.Before each meal 3 times a day, 140-199 - 2 units, 200-250 - 4 units, 251-299 - 6 units,  300-349 - 8 units,  350 or above 10 units. Dispense syringes and needles as needed, Ok to switch to PEN if approved. DX DM2, Code  E11.65 Patient taking differently: Inject 10 Units into the skin See admin instructions. Substitute to any brand approved.Before each meal 3 times a day, 140-199 - 2 units, 200-250 - 4 units, 251-299 - 6 units,  300-349 - 8 units,  350 or above 10 units. Dispense syringes and needles as needed, Ok to switch to PEN if approved. DX DM2, Code E11.65 02/28/18  Yes Thurnell Lose, MD  insulin glargine (LANTUS) 100 UNIT/ML injection Inject 0.25 mLs (25 Units total) into the skin 2 (two) times daily. Dispense insulin pen if approved, if not dispense as needed syringes and needles for 1 month supply. Can switch to Levemir. Diagnosis E 11.65. Patient taking differently: Inject 25 Units into the skin at bedtime.  02/28/18  Yes Thurnell Lose, MD  potassium chloride SA (K-DUR,KLOR-CON) 20 MEQ tablet TAKE 2 TABLETS BY MOUTH IN THE MORNING AND 1 TABLET IN THE EVENING Patient taking differently: Take 40 mEq by mouth See admin instructions. TAKE 40 mg  IN THE MORNING AND 20 mg TABLET IN THE EVENING 01/16/18  Yes Larey Dresser, MD  ranolazine (RANEXA) 500 MG 12 hr tablet TAKE 1 TABLET (500 MG TOTAL) BY MOUTH 2 (TWO) TIMES DAILY. 12/19/17  Yes Larey Dresser, MD  torsemide (DEMADEX) 20 MG tablet Take 3 tablets (60 mg total) by mouth daily. 12/26/17  Yes Carroll Sage, MD  warfarin (COUMADIN) 5 MG tablet Take 1 tablet (5 mg total) by mouth daily at 6 PM. Take As Directed by [facility] Patient taking differently: Take 5-7.5 mg by mouth daily at 6 PM. Take 7.5 mg on Mon. Wed. Fri Take 5 mg tues, thurs, sat. sunday 03/01/18  Yes Thurnell Lose, MD  allopurinol (ZYLOPRIM) 300 MG tablet Take 300 mg by mouth daily.     [provider]  amoxicillin-clavulanate (AUGMENTIN) 875-125 MG tablet Take 1 tablet by mouth every 12 (twelve) hours. Patient not taking: Reported on 03/28/2018 02/28/18   Thurnell Lose, MD  atorvastatin (LIPITOR) 40 MG tablet Take 1 tablet (40 mg total) by mouth daily. Patient  not taking: Reported on 03/28/2018 08/23/17   Lenore Cordia, MD  blood glucose meter kit and supplies KIT Dispense based on patient and insurance preference. Use up to four times daily as directed. (FOR ICD-9 250.00, 250.01). For QAC - HS accuchecks. 02/28/18   Thurnell Lose, MD  calcitRIOL (ROCALTROL) 0.25 MCG capsule Take 0.25 mcg by mouth daily.    [provider]  diclofenac sodium (VOLTAREN) 1 % GEL Apply 2 g topically 4 (four) times daily. 02/28/18   Thurnell Lose, MD  Insulin Syringe-Needle U-100 25G X 1" 1 ML MISC For 4 times a day insulin SQ, 1 month supply. Diagnosis E11.65 02/28/18   Thurnell Lose, MD  Insulin Syringe-Needle U-100 31G X 15/64" 0.3 ML MISC Use to inject insulin 4 times a day 07/27/16   Sid Falcon, MD  isosorbide mononitrate (IMDUR) 30 MG 24 hr tablet TAKE 3 TABLETS (90 MG TOTAL) BY MOUTH DAILY. 09/07/17 02/25/18  Larey Dresser, MD  magnesium oxide (MAG-OX) 400 (241.3 Mg) MG tablet Take 1 tablet (400 mg total) by mouth daily. 04/15/16   Shirley Friar, PA-C  metolazone (ZAROXOLYN) 2.5 MG tablet TAKE 1 TABLET BY MOUTH AS NEEDED AS DIRECTED Patient taking differently: Take 2.5 mg by mouth as needed (fluid).  12/15/16   Bensimhon, Shaune Pascal, MD    Inpatient Medications: Scheduled Meds: . allopurinol  300 mg Oral Daily  . amiodarone  200 mg Oral Daily  . atorvastatin  40 mg Oral q1800  . colchicine  0.6 mg Oral Daily  . insulin aspart  0-15 Units Subcutaneous TID WC  . insulin glargine  10 Units Subcutaneous BH-q7a  . insulin glargine  25 Units Subcutaneous QHS  . magnesium oxide  400 mg Oral Daily  . warfarin  7.5 mg Oral ONCE-1800  . Warfarin - Pharmacist Dosing Inpatient   Does not apply q1800   Continuous Infusions: . [START ON 04/01/2018] ceFEPime (MAXIPIME) IV     PRN Meds: acetaminophen **OR** acetaminophen, albuterol, ondansetron (ZOFRAN) IV **OR** ondansetron, senna-docusate  Allergies:    Allergies  Allergen Reactions  .  No Known Allergies     Social History:   Social History   Socioeconomic History  . Marital status: Widowed    Spouse name: Not on file  . Number of children: Not on file  . Years of education: Not on file  . Highest education level: Not on file  Occupational History  . Not on file  Social Needs  . Financial resource strain: Not on file  . Food insecurity:    Worry: Not on file    Inability: Not on file  . Transportation needs:    Medical: Not on file    Non-medical: Not on file  Tobacco Use  . Smoking status: Former Smoker    Years: 10.00    Types: Cigars    Last attempt to quit: 07/28/2002    Years since quitting: 15.6  . Smokeless tobacco: Never Used  Substance and Sexual Activity  . Alcohol use: No    Comment: 03/28/2016 "stopped drinking 4-5 months ago"  . Drug use: No  . Sexual activity: Not Currently  Lifestyle  . Physical activity:    Days per week: Not on file    Minutes per session: Not on file  . Stress: Not on file  Relationships  . Social connections:    Talks on phone: Not on file    Gets together: Not on file    Attends religious service: Not on file    Active member of club or organization: Not on file    Attends meetings of clubs or organizations: Not on file    Relationship status: Not on file  . Intimate partner violence:    Fear of current or ex partner: Not on file    Emotionally abused: Not on file    Physically abused: Not on file    Forced sexual activity: Not on file  Other Topics Concern  . Not on file  Social History Narrative  . Not on file    Family History:    Family History  Problem Relation Age of Onset  . Hypertension Mother   . Heart disease Mother   . Diabetes Mother      ROS:  Please see the history of present illness.   All other ROS reviewed and negative.     Physical Exam/Data:   Vitals:   03/31/18 1200 03/31/18 1300  03/31/18 1400 03/31/18 1500  BP: (!) 141/88 137/77 (!) 145/77 122/70  Pulse: 77 84 80 80    Resp: (!) 25 (!) 24 (!) 26 (!) 27  Temp:      TempSrc:      SpO2: 93% 97% 96% 93%  Weight:      Height:        Intake/Output Summary (Last 24 hours) at 03/31/2018 1522 Last data filed at 03/31/2018 1500 Gross per 24 hour  Intake 1261.66 ml  Output 600 ml  Net 661.66 ml   Last 3 Weights 03/31/2018 03/30/2018 03/29/2018  Weight (lbs) 251 lb 15.8 oz 252 lb 3.2 oz 256 lb 13.4 oz  Weight (kg) 114.3 kg 114.397 kg 116.5 kg     Body mass index is 36.16 kg/m.  General: Middle-aged gentleman.  He is in mild respiratory distress HEENT: normal Lymph: no adenopathy Neck: no JVD Endocrine:  No thryomegaly Vascular: No carotid bruits; FA pulses 2+ bilaterally without bruits  Cardiac: Regular rate, difficult to hear because of his wheezing Lungs: Rhonchi and wheezes bilaterally. Abd: soft, nontender, no hepatomegaly  Ext: Leg edema Musculoskeletal:  No deformities, BUE and BLE strength normal and equal Skin: warm and dry  Neuro:  CNs 2-12 intact, no focal abnormalities noted Psych:  Normal affect   EKG:  The EKG was personally reviewed and demonstrates: Normal sinus rhythm Telemetry:  Telemetry was personally reviewed and demonstrates: Sinus rhythm  Relevant CV Studies:   Laboratory Data:  Chemistry Recent Labs  Lab 03/29/18 0355 03/30/18 0630 03/31/18 1024  NA 139 141 138  K 3.7 3.4* 3.5  CL 102 105 102  CO2 '28 22 25  ' GLUCOSE 177* 129* 165*  BUN 23* 39* 58*  CREATININE 2.62* 3.82* 6.52*  CALCIUM 8.3* 8.4* 8.0*  GFRNONAA 28* 18* 9*  GFRAA 32* 20* 11*  ANIONGAP '9 14 11    ' Recent Labs  Lab 03/29/18 0355 03/30/18 0630 03/31/18 1024  PROT 7.1 7.2 6.4*  ALBUMIN 3.1* 2.8* 2.5*  AST 35 57* 60*  ALT 27 32 32  ALKPHOS 100 96 89  BILITOT 2.2*  2.5* 2.3* 1.7*   Hematology Recent Labs  Lab 03/29/18 0355 03/30/18 0630 03/31/18 1024  WBC 6.0 6.0 4.5  RBC 4.41 4.35 3.89*  HGB 12.9* 12.9* 11.3*  HCT 41.9 41.1 36.8*  MCV 95.0 94.5 94.6  MCH 29.3 29.7 29.0  MCHC 30.8  31.4 30.7  RDW 13.5 13.6 13.7  PLT 148* 123* PLATELET CLUMPS NOTED ON SMEAR, COUNT APPEARS DECREASED   Cardiac Enzymes Recent Labs  Lab 03/28/18 1846 03/29/18 1627 03/29/18 2134 03/30/18 0630  TROPONINI 0.08* 0.18* 0.25* 0.28*    Recent Labs  Lab 03/28/18 1056  TROPIPOC 0.12*    BNP Recent Labs  Lab 03/28/18 1040  BNP 683.2*    DDimer No results for input(s): DDIMER in the last 168 hours.  Radiology/Studies:  Dg Chest 2 View  Result Date: 03/28/2018 CLINICAL DATA:  Fever, cough, chills, shortness of breath and vomiting since last night, history hypertension and diabetes mellitus EXAM: CHEST - 2 VIEW COMPARISON:  02/26/2018 FINDINGS: LEFT subclavian AICD lead projects over RIGHT ventricle. Enlargement of cardiac silhouette with pulmonary vascular congestion. BILATERAL pulmonary infiltrates which could represent edema or infection. No pleural effusion or pneumothorax. Bones unremarkable. IMPRESSION: Enlargement of cardiac silhouette with pulmonary vascular congestion. BILATERAL pulmonary infiltrates question pulmonary edema versus infection. Electronically Signed   By: Lavonia Dana M.D.   On: 03/28/2018 12:46   Dg Chest [facility]  1 View  Result Date: 03/30/2018 CLINICAL DATA:  Respiratory failure EXAM: PORTABLE CHEST 1 VIEW COMPARISON:  03/28/2018 FINDINGS: Cardiac enlargement.  AICD unchanged. Progressive right upper lobe and right lower lobe airspace disease. Progression of left upper lobe airspace disease. Little change in left lower lobe consolidation. No significant effusion. IMPRESSION: Progressive bilateral airspace disease, most likely edema. Cardiac enlargement. Electronically Signed   By: Franchot Gallo M.D.   On: 03/30/2018 07:03    Assessment and Plan:   1. Acute respiratory failure: I suspect most of his acute respiratory failure is secondary to his pneumonia.  He quite likely has some superimposed congestive heart failure but I suspect that his main issue is pneumonia.  He  is pro calcitonin is still very elevated.  New antibiotics.  Continue treatment for his heart failure.  2.  Acute on chronic combined systolic and diastolic congestive heart failure: His I/O have been  positive for the admission. He is on IV Lasix.  He has been started on metolazone. Most recent echocardiogram from December, 2019 reveals markedly reduced left ventricular systolic function with an ejection fraction of 25 to 30%.  He has elevated end-diastolic pressures.  The CHF team will see him in the am       For questions or updates, please contact [place] Please consult [email or web address] for contact info under     Signed, Mertie Moores, MD  03/31/2018 3:22 PM

## 2018-04-01 ENCOUNTER — Inpatient Hospital Stay (HOSPITAL_COMMUNITY): Payer: Medicaid Other

## 2018-04-01 ENCOUNTER — Inpatient Hospital Stay (HOSPITAL_COMMUNITY): Payer: Medicaid Other | Admitting: Certified Registered Nurse Anesthetist

## 2018-04-01 DIAGNOSIS — N184 Chronic kidney disease, stage 4 (severe): Secondary | ICD-10-CM

## 2018-04-01 DIAGNOSIS — I469 Cardiac arrest, cause unspecified: Secondary | ICD-10-CM

## 2018-04-01 DIAGNOSIS — N179 Acute kidney failure, unspecified: Secondary | ICD-10-CM

## 2018-04-01 DIAGNOSIS — I5023 Acute on chronic systolic (congestive) heart failure: Secondary | ICD-10-CM

## 2018-04-01 LAB — POCT I-STAT 3, ART BLOOD GAS (G3+)
Acid-Base Excess: 1 mmol/L (ref 0.0–2.0)
Acid-base deficit: 8 mmol/L — ABNORMAL HIGH (ref 0.0–2.0)
Acid-base deficit: 5 mmol/L — ABNORMAL HIGH (ref 0.0–2.0)
Acid-base deficit: 3 mmol/L — ABNORMAL HIGH (ref 0.0–2.0)
Bicarbonate: 23.9 mmol/L (ref 20.0–28.0)
Bicarbonate: 26.6 mmol/L (ref 20.0–28.0)
Bicarbonate: 23.2 mmol/L (ref 20.0–28.0)
Bicarbonate: 21.5 mmol/L (ref 20.0–28.0)
O2 Saturation: 35 %
O2 Saturation: 93 %
O2 Saturation: 80 %
O2 Saturation: 95 %
Patient temperature: 98.6
Patient temperature: 99.4
Patient temperature: 98.4
Patient temperature: 97.5
TCO2: 26 mmol/L (ref 22–32)
TCO2: 28 mmol/L (ref 22–32)
TCO2: 25 mmol/L (ref 22–32)
TCO2: 23 mmol/L (ref 22–32)
pCO2 arterial: 79.3 mmHg (ref 32.0–48.0)
pCO2 arterial: 44.4 mmHg (ref 32.0–48.0)
pCO2 arterial: 53.3 mmHg — ABNORMAL HIGH (ref 32.0–48.0)
pCO2 arterial: 34.4 mmHg (ref 32.0–48.0)
pH, Arterial: 7.087 — CL (ref 7.350–7.450)
pH, Arterial: 7.388 (ref 7.350–7.450)
pH, Arterial: 7.246 — ABNORMAL LOW (ref 7.350–7.450)
pH, Arterial: 7.401 (ref 7.350–7.450)
pO2, Arterial: 30 mmHg — CL (ref 83.0–108.0)
pO2, Arterial: 68 mmHg — ABNORMAL LOW (ref 83.0–108.0)
pO2, Arterial: 52 mmHg — ABNORMAL LOW (ref 83.0–108.0)
pO2, Arterial: 72 mmHg — ABNORMAL LOW (ref 83.0–108.0)

## 2018-04-01 LAB — CBC
HCT: 34.7 % — ABNORMAL LOW (ref 39.0–52.0)
HCT: 42.4 % (ref 39.0–52.0)
Hemoglobin: 10.7 g/dL — ABNORMAL LOW (ref 13.0–17.0)
Hemoglobin: 12.5 g/dL — ABNORMAL LOW (ref 13.0–17.0)
MCH: 29.1 pg (ref 26.0–34.0)
MCH: 28.5 pg (ref 26.0–34.0)
MCHC: 30.8 g/dL (ref 30.0–36.0)
MCHC: 29.5 g/dL — AB (ref 30.0–36.0)
MCV: 94.3 fL (ref 80.0–100.0)
MCV: 96.8 fL (ref 80.0–100.0)
Platelets: 109 10*3/uL — ABNORMAL LOW (ref 150–400)
Platelets: 144 10*3/uL — ABNORMAL LOW (ref 150–400)
RBC: 3.68 MIL/uL — ABNORMAL LOW (ref 4.22–5.81)
RBC: 4.38 MIL/uL (ref 4.22–5.81)
RDW: 13.7 % (ref 11.5–15.5)
RDW: 13.7 % (ref 11.5–15.5)
WBC: 4.1 10*3/uL (ref 4.0–10.5)
WBC: 8.7 10*3/uL (ref 4.0–10.5)
nRBC: 0 % (ref 0.0–0.2)
nRBC: 0.2 % (ref 0.0–0.2)

## 2018-04-01 LAB — COMPREHENSIVE METABOLIC PANEL
ALT: 33 U/L (ref 0–44)
ALT: 38 U/L (ref 0–44)
AST: 69 U/L — ABNORMAL HIGH (ref 15–41)
AST: 78 U/L — ABNORMAL HIGH (ref 15–41)
Albumin: 2.3 g/dL — ABNORMAL LOW (ref 3.5–5.0)
Albumin: 2.6 g/dL — ABNORMAL LOW (ref 3.5–5.0)
Alkaline Phosphatase: 92 U/L (ref 38–126)
Alkaline Phosphatase: 104 U/L (ref 38–126)
Anion gap: 14 (ref 5–15)
Anion gap: 18 — ABNORMAL HIGH (ref 5–15)
BUN: 70 mg/dL — ABNORMAL HIGH (ref 6–20)
BUN: 76 mg/dL — ABNORMAL HIGH (ref 6–20)
CALCIUM: 9.1 mg/dL (ref 8.9–10.3)
CO2: 22 mmol/L (ref 22–32)
CO2: 21 mmol/L — ABNORMAL LOW (ref 22–32)
Calcium: 7.7 mg/dL — ABNORMAL LOW (ref 8.9–10.3)
Chloride: 98 mmol/L (ref 98–111)
Chloride: 98 mmol/L (ref 98–111)
Creatinine, Ser: 8.98 mg/dL — ABNORMAL HIGH (ref 0.61–1.24)
Creatinine, Ser: 10.07 mg/dL — ABNORMAL HIGH (ref 0.61–1.24)
GFR calc Af Amer: 7 mL/min — ABNORMAL LOW (ref >60)
GFR calc Af Amer: 6 mL/min — ABNORMAL LOW (ref >60)
GFR calc non Af Amer: 6 mL/min — ABNORMAL LOW (ref >60)
GFR, EST NON AFRICAN AMERICAN: 5 mL/min — AB (ref >60)
Glucose, Bld: 112 mg/dL — ABNORMAL HIGH (ref 70–99)
Glucose, Bld: 164 mg/dL — ABNORMAL HIGH (ref 70–99)
Potassium: 3.5 mmol/L (ref 3.5–5.1)
Potassium: 3.5 mmol/L (ref 3.5–5.1)
Sodium: 134 mmol/L — ABNORMAL LOW (ref 135–145)
Sodium: 137 mmol/L (ref 135–145)
Total Bilirubin: 1.3 mg/dL — ABNORMAL HIGH (ref 0.3–1.2)
Total Bilirubin: 1.6 mg/dL — ABNORMAL HIGH (ref 0.3–1.2)
Total Protein: 6.6 g/dL (ref 6.5–8.1)
Total Protein: 7.3 g/dL (ref 6.5–8.1)

## 2018-04-01 LAB — RENAL FUNCTION PANEL
Albumin: 2.3 g/dL — ABNORMAL LOW (ref 3.5–5.0)
Anion gap: 15 (ref 5–15)
BUN: 66 mg/dL — ABNORMAL HIGH (ref 6–20)
CO2: 22 mmol/L (ref 22–32)
Calcium: 8.1 mg/dL — ABNORMAL LOW (ref 8.9–10.3)
Chloride: 100 mmol/L (ref 98–111)
Creatinine, Ser: 8.71 mg/dL — ABNORMAL HIGH (ref 0.61–1.24)
GFR calc Af Amer: 8 mL/min — ABNORMAL LOW (ref >60)
GFR calc non Af Amer: 7 mL/min — ABNORMAL LOW (ref >60)
Glucose, Bld: 149 mg/dL — ABNORMAL HIGH (ref 70–99)
POTASSIUM: 3.6 mmol/L (ref 3.5–5.1)
Phosphorus: 4.2 mg/dL (ref 2.5–4.6)
Sodium: 137 mmol/L (ref 135–145)

## 2018-04-01 LAB — POCT ACTIVATED CLOTTING TIME
Activated Clotting Time: 213 seconds
Activated Clotting Time: 224 seconds

## 2018-04-01 LAB — PROTIME-INR
INR: 3.34
INR: 3.88
Prothrombin Time: 33.3 seconds — ABNORMAL HIGH (ref 11.4–15.2)
Prothrombin Time: 37.5 s — ABNORMAL HIGH (ref 11.4–15.2)

## 2018-04-01 LAB — GLUCOSE, CAPILLARY
Glucose-Capillary: 91 mg/dL (ref 70–99)
Glucose-Capillary: 62 mg/dL — ABNORMAL LOW (ref 70–99)
Glucose-Capillary: 117 mg/dL — ABNORMAL HIGH (ref 70–99)
Glucose-Capillary: 66 mg/dL — ABNORMAL LOW (ref 70–99)
Glucose-Capillary: 148 mg/dL — ABNORMAL HIGH (ref 70–99)
Glucose-Capillary: 120 mg/dL — ABNORMAL HIGH (ref 70–99)
Glucose-Capillary: 105 mg/dL — ABNORMAL HIGH (ref 70–99)

## 2018-04-01 LAB — MAGNESIUM: Magnesium: 1.8 mg/dL (ref 1.7–2.4)

## 2018-04-01 LAB — AMMONIA: Ammonia: 39 umol/L — ABNORMAL HIGH (ref 9–35)

## 2018-04-01 LAB — SODIUM, URINE, RANDOM: Sodium, Ur: 52 mmol/L

## 2018-04-01 LAB — PHOSPHORUS: Phosphorus: 4.1 mg/dL (ref 2.5–4.6)

## 2018-04-01 LAB — TROPONIN I: Troponin I: 0.25 ng/mL

## 2018-04-01 LAB — LACTIC ACID, PLASMA
Lactic Acid, Venous: 4.9 mmol/L (ref 0.5–1.9)
Lactic Acid, Venous: 1.1 mmol/L (ref 0.5–1.9)

## 2018-04-01 LAB — CREATININE, URINE, RANDOM: Creatinine, Urine: 134.17 mg/dL

## 2018-04-01 MED ORDER — SODIUM CHLORIDE 0.9% FLUSH
10.0000 mL | INTRAVENOUS | Status: DC | PRN
  Administered 2018-04-25: 10 mL
  Filled 2018-04-01: qty 40

## 2018-04-01 MED ORDER — FENTANYL BOLUS VIA INFUSION
25.0000 ug | INTRAVENOUS | Status: DC | PRN
  Administered 2018-04-02 – 2018-04-11 (×2): 50 ug via INTRAVENOUS
  Filled 2018-04-01: qty 50

## 2018-04-01 MED ORDER — MAGNESIUM SULFATE 2 GM/50ML IV SOLN
2.0000 g | Freq: Once | INTRAVENOUS | Status: AC
  Administered 2018-04-01: 2 g via INTRAVENOUS

## 2018-04-01 MED ORDER — HEPARIN BOLUS VIA INFUSION (CRRT)
1000.0000 [IU] | INTRAVENOUS | Status: DC | PRN
  Filled 2018-04-01: qty 1000

## 2018-04-01 MED ORDER — HEPARIN SODIUM (PORCINE) 1000 UNIT/ML DIALYSIS
1000.0000 [IU] | INTRAMUSCULAR | Status: DC | PRN
  Administered 2018-04-16: 2800 [IU] via INTRAVENOUS_CENTRAL
  Filled 2018-04-01: qty 1
  Filled 2018-04-01 (×2): qty 6

## 2018-04-01 MED ORDER — SODIUM BICARBONATE 8.4 % IV SOLN
INTRAVENOUS | Status: AC
  Administered 2018-04-01: 50 meq via INTRAVENOUS
  Filled 2018-04-01: qty 50

## 2018-04-01 MED ORDER — MAGNESIUM SULFATE 2 GM/50ML IV SOLN
INTRAVENOUS | Status: AC
  Filled 2018-04-01: qty 50

## 2018-04-01 MED ORDER — SODIUM CHLORIDE 0.9 % IV SOLN
2.0000 g | INTRAVENOUS | Status: AC
  Administered 2018-04-02 – 2018-04-04 (×3): 2 g via INTRAVENOUS
  Filled 2018-04-01 (×3): qty 20

## 2018-04-01 MED ORDER — NOREPINEPHRINE-SODIUM CHLORIDE 4-0.9 MG/250ML-% IV SOLN
0.0000 ug/min | INTRAVENOUS | Status: DC
  Administered 2018-04-01: 5 ug/min via INTRAVENOUS
  Administered 2018-04-01: 14 ug/min via INTRAVENOUS
  Administered 2018-04-02: 6 ug/min via INTRAVENOUS
  Filled 2018-04-01 (×3): qty 250

## 2018-04-01 MED ORDER — MAGNESIUM SULFATE 2 GM/50ML IV SOLN
2.0000 g | Freq: Once | INTRAVENOUS | Status: AC
  Administered 2018-04-01: 2 g via INTRAVENOUS
  Filled 2018-04-01: qty 50

## 2018-04-01 MED ORDER — TORSEMIDE 20 MG PO TABS: 60.0000 mg | ORAL_TABLET | Freq: Every day | ORAL | Status: DC

## 2018-04-01 MED ORDER — ORAL CARE MOUTH RINSE
15.0000 mL | OROMUCOSAL | Status: DC
  Administered 2018-04-01 – 2018-04-16 (×146): 15 mL via OROMUCOSAL

## 2018-04-01 MED ORDER — MIDAZOLAM HCL 2 MG/2ML IJ SOLN
2.0000 mg | Freq: Once | INTRAMUSCULAR | Status: AC
  Administered 2018-04-01: 2 mg via INTRAVENOUS

## 2018-04-01 MED ORDER — CARVEDILOL 3.125 MG PO TABS
3.1250 mg | ORAL_TABLET | Freq: Two times a day (BID) | ORAL | Status: DC
  Administered 2018-04-01: 3.125 mg via ORAL
  Filled 2018-04-01 (×3): qty 1

## 2018-04-01 MED ORDER — DOCUSATE SODIUM 50 MG/5ML PO LIQD: 100.0000 mg | Freq: Two times a day (BID) | ORAL | Status: DC | PRN

## 2018-04-01 MED ORDER — CARVEDILOL 3.125 MG PO TABS: 3.1250 mg | ORAL_TABLET | Freq: Two times a day (BID) | ORAL | Status: DC

## 2018-04-01 MED ORDER — SODIUM CHLORIDE 0.9% FLUSH
10.0000 mL | Freq: Two times a day (BID) | INTRAVENOUS | Status: DC
  Administered 2018-04-02 – 2018-04-04 (×6): 10 mL
  Administered 2018-04-05: 40 mL
  Administered 2018-04-06 – 2018-04-13 (×13): 10 mL
  Administered 2018-04-13: 40 mL
  Administered 2018-04-14 – 2018-04-25 (×14): 10 mL

## 2018-04-01 MED ORDER — PRISMASOL BGK 4/2.5 32-4-2.5 MEQ/L REPLACEMENT SOLN
Status: DC
  Administered 2018-04-01 – 2018-04-05 (×7): via INTRAVENOUS_CENTRAL
  Filled 2018-04-01 (×6): qty 5000

## 2018-04-01 MED ORDER — SODIUM CHLORIDE 0.9 % IV SOLN
250.0000 [IU]/h | INTRAVENOUS | Status: DC
  Administered 2018-04-01: 250 [IU]/h via INTRAVENOUS_CENTRAL
  Filled 2018-04-01: qty 2

## 2018-04-01 MED ORDER — HEPARIN SODIUM (PORCINE) 1000 UNIT/ML DIALYSIS
1000.0000 [IU] | INTRAMUSCULAR | Status: DC | PRN
  Administered 2018-04-05: 2400 [IU] via INTRAVENOUS_CENTRAL
  Administered 2018-04-05: 3200 [IU] via INTRAVENOUS_CENTRAL
  Filled 2018-04-01 (×3): qty 6

## 2018-04-01 MED ORDER — FUROSEMIDE 10 MG/ML IJ SOLN
160.0000 mg | Freq: Once | INTRAVENOUS | Status: AC
  Administered 2018-04-01: 160 mg via INTRAVENOUS
  Filled 2018-04-01: qty 16

## 2018-04-01 MED ORDER — FENTANYL CITRATE (PF) 100 MCG/2ML IJ SOLN
100.0000 ug | Freq: Once | INTRAMUSCULAR | Status: AC
  Administered 2018-04-01: 100 ug via INTRAVENOUS

## 2018-04-01 MED ORDER — DEXTROSE 50 % IV SOLN
INTRAVENOUS | Status: AC
  Administered 2018-04-01: 25 mL
  Filled 2018-04-01: qty 50

## 2018-04-01 MED ORDER — SODIUM BICARBONATE 8.4 % IV SOLN
50.0000 meq | Freq: Once | INTRAVENOUS | Status: AC
  Administered 2018-04-01 (×2): 50 meq via INTRAVENOUS

## 2018-04-01 MED ORDER — CHLORHEXIDINE GLUCONATE CLOTH 2 % EX PADS
6.0000 | MEDICATED_PAD | Freq: Every day | CUTANEOUS | Status: DC
  Administered 2018-04-02 – 2018-04-19 (×17): 6 via TOPICAL

## 2018-04-01 MED ORDER — PRISMASOL BGK 4/2.5 32-4-2.5 MEQ/L REPLACEMENT SOLN
Status: DC
  Administered 2018-04-01 – 2018-04-05 (×7): via INTRAVENOUS_CENTRAL
  Filled 2018-04-01 (×6): qty 5000

## 2018-04-01 MED ORDER — PRISMASOL BGK 4/2.5 32-4-2.5 MEQ/L IV SOLN
INTRAVENOUS | Status: DC
  Administered 2018-04-01 – 2018-04-05 (×29): via INTRAVENOUS_CENTRAL
  Filled 2018-04-01 (×40): qty 5000

## 2018-04-01 MED ORDER — HEPARIN (PORCINE) 2000 UNITS/L FOR CRRT
INTRAVENOUS_CENTRAL | Status: DC | PRN
  Administered 2018-04-01 – 2018-04-04 (×2): via INTRAVENOUS_CENTRAL
  Filled 2018-04-01: qty 1000

## 2018-04-01 MED ORDER — MIDAZOLAM HCL 2 MG/2ML IJ SOLN
INTRAMUSCULAR | Status: AC
  Administered 2018-04-01: 2 mg via INTRAVENOUS
  Filled 2018-04-01: qty 2

## 2018-04-01 MED ORDER — FENTANYL CITRATE (PF) 100 MCG/2ML IJ SOLN
INTRAMUSCULAR | Status: AC
  Administered 2018-04-01: 100 ug via INTRAVENOUS
  Filled 2018-04-01: qty 2

## 2018-04-01 MED ORDER — FENTANYL 2500MCG IN NS 250ML (10MCG/ML) PREMIX INFUSION
0.0000 ug/h | INTRAVENOUS | Status: DC
  Administered 2018-04-01: 375 ug/h via INTRAVENOUS
  Administered 2018-04-01: 150 ug/h via INTRAVENOUS
  Administered 2018-04-02 (×2): 375 ug/h via INTRAVENOUS
  Filled 2018-04-01 (×3): qty 250

## 2018-04-01 MED ORDER — CHLORHEXIDINE GLUCONATE 0.12% ORAL RINSE (MEDLINE KIT)
15.0000 mL | Freq: Two times a day (BID) | OROMUCOSAL | Status: DC
  Administered 2018-04-01 – 2018-04-16 (×30): 15 mL via OROMUCOSAL

## 2018-04-01 MED FILL — Medication: Qty: 1 | Status: AC

## 2018-04-01 NOTE — Progress Notes (Signed)
Hypoglycemic Event  CBG: 62 at 1127  Treatment: 8 ounces of orange juice  Symptoms: None  Follow-up CBG: Time: 1152 CBG Result: 66  Possible Reasons for Event: NPO status?  Comments/MD notified: Internal Medicine Resident Ledell Noss MD notified    [facility]

## 2018-04-01 NOTE — Consult Note (Signed)
Reason for Consult: Acute kidney injury on chronic kidney disease stage IIIb/IV Referring Physician: Kipp Brood MD (CCM)  HPI:  48 year old African-American man with past medical history significant for nonischemic cardiomyopathy with EF 25-30% status post AICD placement, history of atrial flutter status post ablation, hypertension, dyslipidemia, insulin-dependent diabetes mellitus and chronic kidney disease stage IIIb/IV (baseline creatinine suspected to be 2.5-2.8 from recent records, follows Dr. Lorrene [facility] at [facility]).  Ad[facility] to [facility] days ago with nausea, vomiting, fever and shortness of breath with acute hypoxic respiratory failure bibasilar suspected multifactorial from pneumonia and pulmonary edema secondary to CHF exacerbation.  He is on noninvasive positive pressure ventilation and has been minimally responsive to trial of furosemide/metolazone overnight and is getting additional furosemide today.  Concern is raised with worsening renal function with his creatinine that has risen from 2.5 on admission now to 8.9 with continued rise of procalcitonin and marginal urine output.  Based on input/output, he is only listed as 500 cc positive since admission.  He has not had any iodinated intravenous contrast exposure, preceding NSAID use and was on torsemide and metolazone prior to admission.  He has not had significant hypotension upon review of his vital signs and has required intermittent NIPPV for hypoxia.  He had some urine in his bladder earlier with the scan and is now status post Foley catheter placement.  Past Medical History:  Diagnosis Date  . AICD (automatic cardioverter/defibrillator) present   . Atrial flutter ([facility])    a. s/p ablation 03/2016  . Chronic systolic CHF (congestive heart failure) ([facility])   . HCAP (healthcare-associated pneumonia) 03/28/2018  . History of gout   . Hyperlipidemia   . Hypertension   . Myocardial infarction [facility])    "I think I had one a long long time  ago" (03/28/2016)  . NICM (nonischemic cardiomyopathy) ([facility])    a. LHC 6/06: pLAD 20, pLCx 20-30; b. Echo 5/15:  EF 15%, diffuse HK, restrictive physiology, trivial AI, trivial MR, mild LAE, moderate RVE, moderately reduced RVSF, moderate RAE, mild to moderate TR, PASP 43 mmHg  . Obesity   . Persistent atrial fibrillation   . Type II diabetes mellitus ([facility])     Past Surgical History:  Procedure Laterality Date  . CARDIOVERSION N/A 04/07/2016   Procedure: CARDIOVERSION;  Surgeon: Travis Headings, MD;  Location: [facility];  Service: Cardiovascular;  Laterality: N/A;  . CARDIOVERSION N/A 04/11/2016   Procedure: CARDIOVERSION;  Surgeon: Travis Dresser, MD;  Location: [facility];  Service: Cardiovascular;  Laterality: N/A;  . ELECTROPHYSIOLOGIC STUDY N/A 03/31/2016   Procedure: A-Flutter Ablation;  Surgeon: Travis Lance, MD;  Location: [facility];  Service: Cardiovascular;  Laterality: N/A;  . IMPLANTABLE CARDIOVERTER DEFIBRILLATOR (ICD) GENERATOR CHANGE N/A 08/27/2013   Procedure: ICD GENERATOR CHANGE;  Surgeon: Travis Lance, MD;  Location: [facility];  Service: Cardiovascular;  Laterality: N/A;  . TEE WITHOUT CARDIOVERSION N/A 03/31/2016   Procedure: TRANSESOPHAGEAL ECHOCARDIOGRAM (TEE);  Surgeon: Travis Dresser, MD;  Location: [facility];  Service: Cardiovascular;  Laterality: N/A;    Family History  Problem Relation Age of Onset  . Hypertension Mother   . Heart disease Mother   . Diabetes Mother     Social History:  reports that he quit smoking about 15 years ago. His smoking use included cigars. He quit after 10.00 years of use. He has never used smokeless tobacco. He reports that he does not drink alcohol or use drugs.  Allergies:  Allergies  Allergen Reactions  . No Known Allergies     Medications:  Scheduled: . allopurinol  300 mg Oral Daily  . amiodarone  200 mg Oral Daily  . atorvastatin  40 mg Oral q1800  . carvedilol  3.125 mg Oral BID WC  . insulin  glargine  10 Units Subcutaneous BH-q7a  . magnesium oxide  400 mg Oral Daily  . Warfarin - Pharmacist Dosing Inpatient   Does not apply q1800   BMP Latest Ref Rng & Units 04/01/2018 03/31/2018 03/30/2018  Glucose 70 - 99 mg/dL 112(H) 165(H) 129(H)  BUN 6 - 20 mg/dL 70(H) 58(H) 39(H)  Creatinine 0.61 - 1.24 mg/dL 8.98(H) 6.52(H) 3.82(H)  BUN/Creat Ratio 9 - 20 - - -  Sodium 135 - 145 mmol/L 134(L) 138 141  Potassium 3.5 - 5.1 mmol/L 3.5 3.5 3.4(L)  Chloride 98 - 111 mmol/L 98 102 105  CO2 22 - 32 mmol/L 22 25 22   Calcium 8.9 - 10.3 mg/dL 7.7(L) 8.0(L) 8.4(L)   CBC Latest Ref Rng & Units 04/01/2018 03/31/2018 03/30/2018  WBC 4.0 - 10.5 K/uL 4.1 4.5 6.0  Hemoglobin 13.0 - 17.0 g/dL 10.7(L) 11.3(L) 12.9(L)  Hematocrit 39.0 - 52.0 % 34.7(L) 36.8(L) 41.1  Platelets 150 - 400 K/uL 109(L) PLATELET CLUMPS NOTED ON SMEAR, COUNT APPEARS DECREASED 123(L)   Urinalysis    Component Value Date/Time   COLORURINE YELLOW 03/28/2018 1040   APPEARANCEUR CLEAR 03/28/2018 1040   LABSPEC 1.015 03/28/2018 1040   PHURINE 7.0 03/28/2018 1040   GLUCOSEU 50 (A) 03/28/2018 1040   HGBUR NEGATIVE 03/28/2018 1040   BILIRUBINUR NEGATIVE 03/28/2018 1040   KETONESUR NEGATIVE 03/28/2018 1040   PROTEINUR 100 (A) 03/28/2018 1040   UROBILINOGEN 0.2 12/18/2006 0428   NITRITE NEGATIVE 03/28/2018 1040   LEUKOCYTESUR NEGATIVE 03/28/2018 1040   Dg Chest Port 1 View  Result Date: 04/01/2018 CLINICAL DATA:  CHF, respiratory failure, pneumonia EXAM: PORTABLE CHEST 1 VIEW COMPARISON:  03/30/2018 FINDINGS: Cardiomegaly noted with AICD as before. Worsening perihilar and lower lobe airspace consolidation with central air bronchograms. Some improvement in the right upper lobe aeration. Appearance remains nonspecific but compatible with a combination of CHF with superimposed pneumonia. No enlarging effusion. No pneumothorax. Trachea is midline. IMPRESSION: Persistent bilateral airspace disease, slightly worse in the lower lobes but  improved in the right upper lobe, suspect component of combined CHF/multilobar pneumonia. Electronically Signed   By: Jerilynn Mages.  Shick M.D.   On: 04/01/2018 10:43    Review of Systems  Unable to perform ROS: Severe respiratory distress   Blood pressure (!) 135/96, pulse 92, temperature 99.9 F (37.7 C), temperature source Axillary, resp. rate (!) 29, height 5\' 10"  (1.778 m), weight 113.6 kg, SpO2 92 %. Physical Exam  Nursing note and vitals reviewed. Constitutional: He appears well-developed and well-nourished.  Appears comfortable on BiPAP-somnolent  HENT:  Head: Normocephalic and atraumatic.  Eyes: Pupils are equal, round, and reactive to light. Conjunctivae are normal. No scleral icterus.  Neck: JVD present. No tracheal deviation present. No thyromegaly present.  Cardiovascular: Normal rate, regular rhythm and normal heart sounds.  No murmur heard. Respiratory: Effort normal. He has no wheezes. He has rales.  GI: Soft. Bowel sounds are normal. There is no abdominal tenderness. There is no rebound and no guarding.  Musculoskeletal: Normal range of motion.        General: Edema present.  Neurological: He is alert.  Somnolent but awakens to calling out his name  Skin: Skin is warm and dry. No erythema.  Assessment/Plan: 1.  Acute kidney injury on chronic kidney disease stage IIIb/IV: With progressive rise of BUN/creatinine and meager response to challenge of diuretics overnight and compounding volume overload.  Will start him on CRRT for volume unloading/clearance at this time anticipating that he will again show resistance to diuretic therapy given his declining renal function.  I suspect that this will be transient dialysis requirement and we should see improvement of cardiac output/renal perfusion with satisfactory volume unloading.  Continue to avoid iodinated intravenous contrast and renally adjust medications to limit toxicity.  Agree with Foley catheter and will check renal ultrasound to  verify that he does not have any significant obstruction. 2.  Acute hypoxic respiratory failure: Suspected to be from bilateral viral pneumonia based on respiratory panel and superimposed pulmonary edema.  On broad-spectrum antimicrobial coverage with cefepime for healthcare associated pneumonia.  Unfortunately refractory to diuretic therapy so far and will start CRRT for volume unloading. 3.  Hyponatremia: Mild and secondary to acute kidney injury/free water excretion defect.  Monitor with CRRT. 4.  History of atrial fibrillation/atrial flutter status post DCCV and ablation 5.  Hypokalemia: Mild, monitor with current CRRT prescription.  [facility] K. 04/01/2018, 12:45 PM

## 2018-04-01 NOTE — Consult Note (Addendum)
Advanced Heart Failure Team Consult Note   Primary Physician: Carroll Sage, MD PCP-Cardiologist: Dr Aundra Dubin   Reason for Consultation: Heart Failure   HPI:    Travis Bryan is seen today for evaluation of heart failure at the request of Dr Acie Fredrickson.   Travis Bryan is 48 year old with a history of  DM, A flutter/Fib, CKD Stage IV, HTN, NICM, chronic systolic heart failure, ST Jude ICD, and VT. He has been followed in the HF clinic and was last seen 07/2017. At that time he was stable.   Admitted 02/25/2018 with fever, chills , and nausea. Treated for CAP, strep bacteremia, and DKA. Blood cx + for strep pneumonia. Treated with IV rocephin and discharged on augmentin. Creatinine on the day discharge was 2.8.  HF medication were continued   Admitted on 1/16 with fever, nausea, vomiting and dyspnea. CXR was concerning for bilateral infiltrates. WBC on admit was not elevated and he was negative for flu. Respiratory status worsened and he was placed on bipap. Pulmonary consulted. Started on IV lasix and antibiotics. Procalcitonin has been trending up 3.38-->30. Yesterday he was diuresed with IV lasix and 2.5 mg metolazone.  Creatinine has worsened and now up to 8.98. Poor urine output. Today he was placed on HFN -->15 liter. HF meds have been held.   Complaining of fatigue.   Blood CX - NGTD.  Respiratory Panel --> metapneumovirus.   Echo 02/2018 Left ventricle: Diffuse hypokinesis worse in the inferior wall.   The cavity size was moderately dilated. Wall thickness was   increased in a pattern of mild LVH. Systolic function was   severely reduced. The estimated ejection fraction was in the   range of 25% to 30%. Doppler parameters are consistent with both   elevated ventricular end-diastolic filling pressure and elevated   left atrial filling pressure. - Left atrium: The atrium was mildly dilated. - Atrial septum: No defect or patent foramen ovale was identified  Review of Systems: [y] =  yes, '[ ]'  = no   General: Weight gain '[ ]' ; Weight loss [Y ]; Anorexia '[ ]' ; Fatigue [Y ]; Fever [ Y]; Chills '[ ]' ; Weakness [Y ]  Cardiac: Chest pain/pressure '[ ]' ; Resting SOB '[ ]' ; Exertional SOB '[ ]' ; Orthopnea '[ ]' ; Pedal Edema '[ ]' ; Palpitations '[ ]' ; Syncope '[ ]' ; Presyncope '[ ]' ; Paroxysmal nocturnal dyspnea'[ ]'   Pulmonary: Cough Jazmín.Cullens ]; Wheezing'[ ]' ; Hemoptysis'[ ]' ; Sputum '[ ]' ; Snoring '[ ]'   GI: Vomiting'[ ]' ; Dysphagia'[ ]' ; Melena'[ ]' ; Hematochezia '[ ]' ; Heartburn'[ ]' ; Abdominal pain '[ ]' ; Constipation '[ ]' ; Diarrhea '[ ]' ; BRBPR '[ ]'   GU: Hematuria'[ ]' ; Dysuria '[ ]' ; Nocturia'[ ]'   Vascular: Pain in legs with walking '[ ]' ; Pain in feet with lying flat '[ ]' ; Non-healing sores '[ ]' ; Stroke '[ ]' ; TIA '[ ]' ; Slurred speech '[ ]' ;  Neuro: Headaches'[ ]' ; Vertigo'[ ]' ; Seizures'[ ]' ; Paresthesias'[ ]' ;Blurred vision '[ ]' ; Diplopia '[ ]' ; Vision changes '[ ]'   Ortho/Skin: Arthritis '[ ]' ; Joint pain [ Y]; Muscle pain '[ ]' ; Joint swelling '[ ]' ; Back Pain '[ ]' ; Rash '[ ]'   Psych: Depression'[ ]' ; Anxiety'[ ]'   Heme: Bleeding problems '[ ]' ; Clotting disorders '[ ]' ; Anemia '[ ]'   Endocrine: Diabetes [Y ]; Thyroid dysfunction'[ ]'   Home Medications Prior to Admission medications   Medication Sig Start Date End Date Taking? Authorizing Provider  amiodarone (PACERONE) 200 MG tablet Take 1 tablet (200 mg total) by mouth daily. 12/26/17  Yes Alfonse Spruce,  Lonia Mad, MD  amLODipine (NORVASC) 5 MG tablet Take 1 tablet (5 mg total) by mouth daily. 08/23/17  Yes Lenore Cordia, MD  carvedilol (COREG) 25 MG tablet TAKE 1 TABLET BY MOUTH TWICE A DAY WITH A MEAL. Patient taking differently: Take 25 mg by mouth 2 (two) times daily with a meal.  12/14/17  Yes Larey Dresser, MD  colchicine 0.6 MG tablet Take 1 tablet (0.6 mg total) by mouth daily. 02/28/18  Yes Thurnell Lose, MD  hydrALAZINE (APRESOLINE) 100 MG tablet TAKE 1 TABLET BY MOUTH 3 TIMES DAILY. Patient taking differently: Take 100 mg by mouth 3 (three) times daily.  11/09/17  Yes Larey Dresser, MD  insulin aspart  (NOVOLOG) 100 UNIT/ML injection Substitute to any brand approved.Before each meal 3 times a day, 140-199 - 2 units, 200-250 - 4 units, 251-299 - 6 units,  300-349 - 8 units,  350 or above 10 units. Dispense syringes and needles as needed, Ok to switch to PEN if approved. DX DM2, Code E11.65 Patient taking differently: Inject 10 Units into the skin See admin instructions. Substitute to any brand approved.Before each meal 3 times a day, 140-199 - 2 units, 200-250 - 4 units, 251-299 - 6 units,  300-349 - 8 units,  350 or above 10 units. Dispense syringes and needles as needed, Ok to switch to PEN if approved. DX DM2, Code E11.65 02/28/18  Yes Thurnell Lose, MD  insulin glargine (LANTUS) 100 UNIT/ML injection Inject 0.25 mLs (25 Units total) into the skin 2 (two) times daily. Dispense insulin pen if approved, if not dispense as needed syringes and needles for 1 month supply. Can switch to Levemir. Diagnosis E 11.65. Patient taking differently: Inject 25 Units into the skin at bedtime.  02/28/18  Yes Thurnell Lose, MD  potassium chloride SA (K-DUR,KLOR-CON) 20 MEQ tablet TAKE 2 TABLETS BY MOUTH IN THE MORNING AND 1 TABLET IN THE EVENING Patient taking differently: Take 40 mEq by mouth See admin instructions. TAKE 40 mg  IN THE MORNING AND 20 mg TABLET IN THE EVENING 01/16/18  Yes Larey Dresser, MD  ranolazine (RANEXA) 500 MG 12 hr tablet TAKE 1 TABLET (500 MG TOTAL) BY MOUTH 2 (TWO) TIMES DAILY. 12/19/17  Yes Larey Dresser, MD  torsemide (DEMADEX) 20 MG tablet Take 3 tablets (60 mg total) by mouth daily. 12/26/17  Yes Carroll Sage, MD  warfarin (COUMADIN) 5 MG tablet Take 1 tablet (5 mg total) by mouth daily at 6 PM. Take As Directed by [facility]mad[facility] Patient taking differently: Take 5-7.5 mg by mouth daily at 6 PM. Take 7.5 mg on Mon. Wed. Fri Take 5 mg tues, thurs, sat. sunday 03/01/18  Yes Thurnell Lose, MD  allopurinol (ZYLOPRIM) 300 MG tablet Take 300 mg by mouth daily.     [provider]  amoxicillin-clavulanate (AUGMENTIN) 875-125 MG tablet Take 1 tablet by mouth every 12 (twelve) hours. Patient not taking: Reported on 03/28/2018 02/28/18   Thurnell Lose, MD  atorvastatin (LIPITOR) 40 MG tablet Take 1 tablet (40 mg total) by mouth daily. Patient not taking: Reported on 03/28/2018 08/23/17   Lenore Cordia, MD  blood glucose meter kit and supplies KIT Dispense based on patient and insurance preference. Use up to four times daily as directed. (FOR ICD-9 250.00, 250.01). For QAC - HS accuchecks. 02/28/18   Thurnell Lose, MD  calcitRIOL (ROCALTROL) 0.25 MCG capsule Take 0.25 mcg by mouth daily.  [provider]  diclofenac sodium (VOLTAREN) 1 % GEL Apply 2 g topically 4 (four) times daily. 02/28/18   Thurnell Lose, MD  Insulin Syringe-Needle U-100 25G X 1" 1 ML MISC For 4 times a day insulin SQ, 1 month supply. Diagnosis E11.65 02/28/18   Thurnell Lose, MD  Insulin Syringe-Needle U-100 31G X 15/64" 0.3 ML MISC Use to inject insulin 4 times a day 07/27/16   Sid Falcon, MD  isosorbide mononitrate (IMDUR) 30 MG 24 hr tablet TAKE 3 TABLETS (90 MG TOTAL) BY MOUTH DAILY. 09/07/17 02/25/18  Larey Dresser, MD  magnesium oxide (MAG-OX) 400 (241.3 Mg) MG tablet Take 1 tablet (400 mg total) by mouth daily. 04/15/16   Shirley Friar, PA-C  metolazone (ZAROXOLYN) 2.5 MG tablet TAKE 1 TABLET BY MOUTH AS NEEDED AS DIRECTED Patient taking differently: Take 2.5 mg by mouth as needed (fluid).  12/15/16   Bensimhon, Shaune Pascal, MD    Past Medical History: Past Medical History:  Diagnosis Date  . AICD (automatic cardioverter/defibrillator) present   . Atrial flutter ([place])    a. s/p ablation 03/2016  . Chronic systolic CHF (congestive heart failure) ([place])   . HCAP (healthcare-associated pneumonia) 03/28/2018  . History of gout   . Hyperlipidemia   . Hypertension   . Myocardial infarction [facility])    "I think I had one a long long time ago"  (03/28/2016)  . NICM (nonischemic cardiomyopathy) ([place])    a. LHC 6/06: pLAD 20, pLCx 20-30; b. Echo 5/15:  EF 15%, diffuse HK, restrictive physiology, trivial AI, trivial Travis, mild LAE, moderate RVE, moderately reduced RVSF, moderate RAE, mild to moderate TR, PASP 43 mmHg  . Obesity   . Persistent atrial fibrillation   . Type II diabetes mellitus ([place])     Past Surgical History: Past Surgical History:  Procedure Laterality Date  . CARDIOVERSION N/A 04/07/2016   Procedure: CARDIOVERSION;  Surgeon: Thayer Headings, MD;  Location: [facility] ENDOSCOPY;  Service: Cardiovascular;  Laterality: N/A;  . CARDIOVERSION N/A 04/11/2016   Procedure: CARDIOVERSION;  Surgeon: Larey Dresser, MD;  Location: [place];  Service: Cardiovascular;  Laterality: N/A;  . ELECTROPHYSIOLOGIC STUDY N/A 03/31/2016   Procedure: A-Flutter Ablation;  Surgeon: Evans Lance, MD;  Location: [place] CV LAB;  Service: Cardiovascular;  Laterality: N/A;  . IMPLANTABLE CARDIOVERTER DEFIBRILLATOR (ICD) GENERATOR CHANGE N/A 08/27/2013   Procedure: ICD GENERATOR CHANGE;  Surgeon: Evans Lance, MD;  Location: [facility] CATH LAB;  Service: Cardiovascular;  Laterality: N/A;  . TEE WITHOUT CARDIOVERSION N/A 03/31/2016   Procedure: TRANSESOPHAGEAL ECHOCARDIOGRAM (TEE);  Surgeon: Larey Dresser, MD;  Location: [facility] ENDOSCOPY;  Service: Cardiovascular;  Laterality: N/A;    Family History: Family History  Problem Relation Age of Onset  . Hypertension Mother   . Heart disease Mother   . Diabetes Mother     Social History: Social History   Socioeconomic History  . Marital status: Widowed    Spouse name: Not on file  . Number of children: Not on file  . Years of education: Not on file  . Highest education level: Not on file  Occupational History  . Not on file  Social Needs  . Financial resource strain: Not on file  . Food insecurity:    Worry: Not on file    Inability: Not on file  . Transportation needs:    Medical: Not on file     Non-medical: Not on file  Tobacco Use  .  Smoking status: Former Smoker    Years: 10.00    Types: Cigars    Last attempt to quit: 07/28/2002    Years since quitting: 15.6  . Smokeless tobacco: Never Used  Substance and Sexual Activity  . Alcohol use: No    Comment: 03/28/2016 "stopped drinking 4-5 months ago"  . Drug use: No  . Sexual activity: Not Currently  Lifestyle  . Physical activity:    Days per week: Not on file    Minutes per session: Not on file  . Stress: Not on file  Relationships  . Social connections:    Talks on phone: Not on file    Gets together: Not on file    Attends religious service: Not on file    Active member of club or organization: Not on file    Attends meetings of clubs or organizations: Not on file    Relationship status: Not on file  Other Topics Concern  . Not on file  Social History Narrative  . Not on file    Allergies:  Allergies  Allergen Reactions  . No Known Allergies     Objective:    Vital Signs:   Temp:  [99.6 F (37.6 C)-102.8 F (39.3 C)] 99.8 F (37.7 C) (01/20 0738) Pulse Rate:  [72-91] 75 (01/20 0800) Resp:  [17-36] 23 (01/20 0800) BP: (107-145)/(55-108) 113/71 (01/20 0827) SpO2:  [84 %-98 %] 93 % (01/20 0827) FiO2 (%):  [90 %-100 %] 90 % (01/20 0415) Weight:  [113.6 kg] 113.6 kg (01/20 0432) Last BM Date: 03/31/18  Weight change: Filed Weights   03/30/18 0500 03/31/18 0447 04/01/18 0432  Weight: 114.4 kg 114.3 kg 113.6 kg    Intake/Output:   Intake/Output Summary (Last 24 hours) at 04/01/2018 0923 Last data filed at 04/01/2018 0800 Gross per 24 hour  Intake 933.95 ml  Output 400 ml  Net 533.95 ml      Physical Exam    General:  Appears chronically ill. No resp difficulty HEENT: normal Neck: supple. JVP hard to assess but does not appear elevated.  . Carotids 2+ bilat; no bruits. No lymphadenopathy or thyromegaly appreciated. Cor: PMI nondisplaced. Regular rate & rhythm. No rubs, gallops or  murmurs. Lungs: Rhonchi throughout on 15 liters HFNC Abdomen: soft, nontender, nondistended. No hepatosplenomegaly. No bruits or masses. Good bowel sounds. Extremities: no cyanosis, clubbing, rash, edema Neuro: alert & orientedx3, cranial nerves grossly intact. moves all 4 extremities w/o difficulty. Affect pleasant   Telemetry  SR 80-90s    EKG   SR 85 bpm PR  Labs   Basic Metabolic Panel: Recent Labs  Lab 03/28/18 1040 03/29/18 0355 03/29/18 1627 03/30/18 0630 03/31/18 1024 04/01/18 0551  NA 138 139  --  141 138 134*  K 3.7 3.7  --  3.4* 3.5 3.5  CL 103 102  --  105 102 98  CO2 24 28  --  '22 25 22  ' GLUCOSE 191* 177*  --  129* 165* 112*  BUN 22* 23*  --  39* 58* 70*  CREATININE 2.52* 2.62*  --  3.82* 6.52* 8.98*  CALCIUM 8.8* 8.3*  --  8.4* 8.0* 7.7*  MG  --   --  1.5*  --  1.8 1.8  PHOS  --   --  2.5  --   --  4.1    Liver Function Tests: Recent Labs  Lab 03/28/18 1040 03/29/18 0355 03/30/18 0630 03/31/18 1024 04/01/18 0551  AST 30 35 57* 60* 69*  ALT 25 27 32 32 33  ALKPHOS 104 100 96 89 92  BILITOT 2.5* 2.2*  2.5* 2.3* 1.7* 1.3*  PROT 7.7 7.1 7.2 6.4* 6.6  ALBUMIN 3.3* 3.1* 2.8* 2.5* 2.3*   No results for input(s): LIPASE, AMYLASE in the last 168 hours. No results for input(s): AMMONIA in the last 168 hours.  CBC: Recent Labs  Lab 03/28/18 1040 03/29/18 0355 03/30/18 0630 03/31/18 1024 04/01/18 0551  WBC 5.8 6.0 6.0 4.5 4.1  NEUTROABS 4.7  --   --   --   --   HGB 12.8* 12.9* 12.9* 11.3* 10.7*  HCT 41.4 41.9 41.1 36.8* 34.7*  MCV 93.7 95.0 94.5 94.6 94.3  PLT 158 148* 123* PLATELET CLUMPS NOTED ON SMEAR, COUNT APPEARS DECREASED 109*    Cardiac Enzymes: Recent Labs  Lab 03/28/18 1846 03/29/18 1627 03/29/18 2134 03/30/18 0630  TROPONINI 0.08* 0.18* 0.25* 0.28*    BNP: BNP (last 3 results) Recent Labs    02/25/18 1235 03/28/18 1040  BNP 207.2* 683.2*    ProBNP (last 3 results) No results for input(s): PROBNP in the last 8760  hours.   CBG: Recent Labs  Lab 03/31/18 0723 03/31/18 1140 03/31/18 1536 03/31/18 2228 04/01/18 0737  GLUCAP 204* 109* 104* 97 91    Coagulation Studies: Recent Labs    03/30/18 0630 03/31/18 0358 04/01/18 0238  LABPROT 24.8* 24.9* 33.3*  INR 2.27 2.29 3.34     Imaging    No results found.   Medications:     Current Medications: . allopurinol  300 mg Oral Daily  . amiodarone  200 mg Oral Daily  . atorvastatin  40 mg Oral q1800  . colchicine  0.6 mg Oral Daily  . insulin aspart  0-15 Units Subcutaneous TID WC  . insulin glargine  10 Units Subcutaneous BH-q7a  . insulin glargine  25 Units Subcutaneous QHS  . magnesium oxide  400 mg Oral Daily  . Warfarin - Pharmacist Dosing Inpatient   Does not apply q1800     Infusions: . ceFEPime (MAXIPIME) IV         Patient Profile   Travis Fackler is 48 year old with a history of  DM, A flutter/Fib, CKD Stage IV, HTN, NICM, chronic systolic heart failure, ST Jude ICD, and VT.  Admitted with bilateral pneumonia.   Assessment/Plan   1. Acute Hypoxic Respiratory Failure in the setting of HCAP/HF Initially on Bipap but earlier today weaned to HFNC 15 liters.  Remains of cefepime.  Blood CX- NGTD. Respiratory panel - metapneumovirus WBC 4.1 . Remains febrile Procalcitonin 3>30  2. AKI on CKD Stage IV Creatinine worsening 2.7>8.9.   - Nephrology consult pending. Li[place]  needs CVVHD. 3. A/C Systolic Heart Failure  NICM. LHC 2006 nonobstructive CAD. Cardiolite 2018 large fixed defect no ischemia. St Jude ICD . ECHO 25-30%   Creatinine worsening with limited urine output. Hold diuretics for now.  Off HF meds with current clinical picture.  4. PAF In SR. On amio 200 mg daily. On coumadin.  Check TSH in am.  5. DM On sliding scale.  6. H/O VT  Watch K and Mag. On amiodarone.  7. CAD  LHC 2006 Non obtstructive CAD.  Cardiolite 2018 large fixed defect.  Troponin 0.08>0.18>0.25. Suspect demand ischemia.  Continue  statin.   Length of Stay: 4  Amy Clegg, NP  04/01/2018, 9:23 AM  Advanced Heart Failure Team Pager [phone] (M-F; 7a - 4p)  Please contact [facility] for night-coverage after  hours (4p -7a ) and weekends on [email or web address]  Patient seen with NP, agree with the above note.   Patient was admitted with recurrent PNA, PCT up to 30.  He has also been thought to be volume overloaded. Creatinine now up to 8.98.  He got Lasix 120 mg IV x 1 with metolazone yesterday with minimal response.  He is not short of breath at rest but remains on high flow oxygen. He has been in NSR.   On exam, JVP 12-14 cm.  Regular S1S2.  Bronchial BS at bases bilaterally.   1. Acute hypoxemic respiratory failure: Suspect bilateral PNA. He grew metapneumovirus.  However, PCT up to 30 is suggestive of bacterial PNA.  He is now afebrile.  - Continue cefepime per CCM. 2. Acute on chronic systolic CHF: He has a nonischemic cardiomyopathy. St Jude ICD.  Echo (12/19) with EF 25-30%.  On exam, he looks volume overloaded but not markedly.  His creatinine is up to 8.98 from baseline around 2.5-2.8. Difficult situation, poor UOP with developing volume overload.  I am concerned that he will end up needing CVVH for volume control. Minimal response to Lasix 120 mg IV x 1 + metolazone yesterday.  - I will give Lasix 160 mg IV x 1 today to see if he responds.  - If no response to diuretics today, will need nephrology to see and will be highly likely to need CVVH for volume control in the near future.  - Hydralazine/Imdur and amlodipine on hold to keep BP up.  I think he can get low dose Coreg 3.125 mg bid (appears he was on 25 mg bid at home).  - He has LBBB with QRS that has widened considerably.  May eventually be candidate for upgrade to CRT-D.  3. AKI: In setting of CKD stage IV.  Suspect cardiorenal syndrome => ATN.  BP has been stable, SBP in 110s-130s currently. Creatinine continues to rise, up to 8.98 from baseline 2.5-2.8.  Some  volume overload on exam with poor UOP.  - Will try high dose Lasix today, 160 mg IV bolus.  - If no response, may be moving towards CVVH.  4. Atrial fibrillation: Paroxysmal.  He remains in NSR on amiodarone.  - Continue warfarin.  5. H/o VT: On amiodarone.   Loralie Champagne 04/01/2018 10:48 AM

## 2018-04-01 NOTE — Progress Notes (Addendum)
[place] for Warfarin Indication: atrial fibrillation  Allergies  Allergen Reactions  . No Known Allergies    Patient Measurements: Height: 5\' 10"  (177.8 cm) Weight: 250 lb 7.1 oz (113.6 kg) IBW/kg (Calculated) : 73  Vital Signs: Temp: 99.8 F (37.7 C) (01/20 0738) Temp Source: Axillary (01/20 0738) BP: 113/71 (01/20 0827) Pulse Rate: 75 (01/20 0800)  Labs: Recent Labs    03/29/18 1627 03/29/18 2134  03/30/18 0630 03/31/18 0358 03/31/18 1024 04/01/18 0238 04/01/18 0551  HGB  --   --    < > 12.9*  --  11.3*  --  10.7*  HCT  --   --   --  41.1  --  36.8*  --  34.7*  PLT  --   --   --  123*  --  PLATELET CLUMPS NOTED ON SMEAR, COUNT APPEARS DECREASED  --  109*  LABPROT  --   --   --  24.8* 24.9*  --  33.3*  --   INR  --   --   --  2.27 2.29  --  3.34  --   CREATININE  --   --   --  3.82*  --  6.52*  --  8.98*  TROPONINI 0.18* 0.25*  --  0.28*  --   --   --   --    < > = values in this interval not displayed.   Estimated Creatinine Clearance: 12.8 mL/min (A) (by C-G formula based on SCr of 8.98 mg/dL (H)).  Assessment: 85 yoM presenting with NV and fever, h/o afib on warfarin PTA. Pharmacy consulted to continue warfarin dosing upon admission. INR trended up from 2.29 to 3.34 today. Of note, patient did not receive ordered warfarin dose on 1/17. Hgb trending down slightly; PLT trended down to 109. No bleeding noted.   Noted interaction with amiodarone, resumed from PTA, and doxycycline which was discontinued 1/18.     PTA warfarin dose: 7.5mg  MWF, 5mg  TTSS (admit INR 3.03)   Of note, Scr increased to 8.98 - CRRT to start today   Goal of Therapy:  INR 2-3 Monitor platelets by anticoagulation protocol: Yes   Plan:  Hold warfarin today with large INR jump  Daily INR Monitor CBC trend and s/sx of bleeding  Carlyon Shadow, PharmD Candidate 2020 04/01/2018,9:53 AM

## 2018-04-01 NOTE — Procedures (Addendum)
Arterial Catheter Insertion Procedure Note Travis Bryan [id number] 06-Feb-1971  Procedure: Insertion of Arterial Catheter  Indications: Blood pressure monitoring and Frequent blood sampling  Procedure Details Consent: Unable to obtain consent because of altered level of consciousness. Time Out: Verified patient identification, verified procedure, site/side was marked, verified correct patient position, special equipment/implants available, medications/allergies/relevent history reviewed, required imaging and test results available.  Performed  Maximum sterile technique was used including antiseptics, cap, gloves, gown, hand hygiene, mask and sheet. Skin prep: Chlorhexidine; local anesthetic administered 20 gauge catheter was inserted into left femoral artery using the Seldinger technique. ULTRASOUND GUIDANCE USED: YES Evaluation Blood flow good; BP tracing good. Complications: No apparent complications.   Travis Bryan 04/01/2018  I was present and supervised the entire procedure. Travis Bryan, M.D. [facility] Pager: [phone] After hours pager: [phone]

## 2018-04-01 NOTE — Progress Notes (Signed)
Pt. Taken off BiPap and placed on 15 L HFNC to eat lunch at 1245. Patient began vomiting large amounts of tan/brown tinged emesis at 1300. Patient did not endorse abdominal pain at this time and stated the "vomiting came out of nowhere". Patient endorsed more difficulty breathing after emesis episode. After cleaning emesis and patient, patient then stated he needed to have a bowel movement and proceeded to be placed on the bedpan at approximately 1320 and asked this RN to step out of the room for privacy. When stepping back into the room, patient was found unresponsive at 1324. Code was initiated. See code blue note.

## 2018-04-01 NOTE — Progress Notes (Signed)
[name] aunt Rise Paganini took home pt's belongings- clothing, wallet, car keys, shoes, and cell phone w/charger.

## 2018-04-01 NOTE — Progress Notes (Signed)
Pateint taken off BiPAP and placed on 15L HFNC at this time. BiPAP on standby . Patient stated he feels ok, SAT 93%.

## 2018-04-01 NOTE — Progress Notes (Signed)
[place] Progress Note Patient Name: Travis Bryan DOB: Jan 23, 1971 MRN: [id number]   Date of Service  04/01/2018  HPI/Events of Note  ABG on 100%/PRVC 28/TV 580/P 12 = 7.246/53.3/52.0. Review of CXR reveals diffuse R lung and R base infiltrates  eICU Interventions  Will order: 1. Recruitment maneuver now. 2. Increase PEEP to 14. 3. ABG at 10 PM.      Intervention Category Major Interventions: Hypoxemia - evaluation and management;Respiratory failure - evaluation and management  Lysle Dingwall 04/01/2018, 7:58 PM

## 2018-04-01 NOTE — Progress Notes (Signed)
Chaplain responded to Code Blue.  Provided ministry of presence and prayer outside of room.  A/C called family and they are en route.  [name] heart restarted.   Will be available. Tamsen Snider Pager [phone]

## 2018-04-01 NOTE — Progress Notes (Addendum)
NAME:  Travis Bryan, MRN:  [id number], DOB:  [birth year], LOS: 4 ADMISSION DATE:  03/28/2018, CONSULTATION DATE:  03/28/2018 REFERRING MD:  Graciella Freer, CHIEF COMPLAINT:  Respiratory failure.   Brief History   48 year old man with non ischemic cardiomyopathy, type 2 diabetes mellitus, atrial fibrillation, severe combined congestive heart failure and chronic kidney disease stage 3. Present with fever and nausea and vomiting. Increase in weight and dyspnea.  Started on BiPAP and antibiotics for suspicion of pneumonia with superimposed decompensated heart failure.  Past Medical History  Hypertension, hyperlipidemia, Type 2 Diabetes mellitus, Chronic combined heart failure (last echo 02/2018 with EF25-30%) with AICD, CKD stage III, paroxysmal atrial fibrillation on coumadin, Gout  Significant Hospital Events   1/17 admission to [place]. PCCM consulted for persistent hypoxic respiratory failure despite 15 L per minute HFNC and he was transferred to the ICU.   Consults:  CCM  Heart failure  Nephrology   Procedures:  None   Significant Diagnostic Tests:  None   Micro Data:  1/17 RVP - metapneumovirus   1/16 blood - negative  1/16 urine culture - neg  MRSA neg   Antimicrobials:  1/17 cefepime >>  1/17 doxycycline > 1/18  1/17 vanc > 1/18   Interim history/subjective:  Tolerated bipap well overnight  Minimal urine output yesterday despite diuresis   Objective   Blood pressure 133/81, pulse 78, temperature 99.8 F (37.7 C), temperature source Axillary, resp. rate (!) 27, height 5' 10" (1.778 m), weight 113.6 kg, SpO2 90 %.    FiO2 (%):  [90 %-100 %] 90 %   Intake/Output Summary (Last 24 hours) at 04/01/2018 1106 Last data filed at 04/01/2018 1000 Gross per 24 hour  Intake 1154.34 ml  Output 400 ml  Net 754.34 ml   Filed Weights   03/30/18 0500 03/31/18 0447 04/01/18 0432  Weight: 114.4 kg 114.3 kg 113.6 kg    Examination: General: chronically ill appearing HENT: Bipap  mask in place  Lungs: diffuse crackles and expiratory wheezing, bronchial breath sounds over the left lower lung fields  Cardiovascular: JVD up to the jawline with the bed at 45*, regular rate and rhythm, no murmur  Abdomen: soft, non tender, non distended  Extremities: warm, no peripheral edema  Neuro: awake and alert, sitting up in bed sleeping comfortably with the [facility] Problem list     Assessment & Plan:   Acute hypoxic respiratory failure  Acute decompensated heart failure Metapneumovirus Pneumonia  Acute on chronic renal failure, non oliguric  Secondary to metapneumovirus pneumonia and pulmonary edema from congestive heart failure and acute on chronic renal failure. Patient has a persistent oxygen requirement with low SpO2 < 80 despite HFNC this morning so he was placed back on Bipap. Minimal response to IV lasix 120 mg and metolazone 2.5 mg yesterday, urine output has been minimal down to 400 cc yesterday with two unmeasured events.  - continue cefepime  - place foley catheter  - obtain renal ultrasound  - consult nephrology, we appreciate their recommendations - cardiology consulted, we appreciate their recommendations   NSTEMI, type 2  Hx of NICM with ICD  Hx of afib/ aflutter post DCCV and ablation 03/2016  Hx VT with ICD discharge 10/17 and 10/18  - Troponin peaked at 0.28  - continue home amiodarone and coumadin  - holding metoprolol, hydral and imdur for soft BP   Type 2 Diabetes  Hypoglycemic this morning after receiving moderate sliding scale insulin. This responded to  25  Will hold off on further novolog today because he is back on bipap and will not be eating. We should restart with sensitive sliding scale when his glucose improves.  - continue lantus 10 units qAM   Best practice:  Diet: NPO  Pain/Anxiety/Delirium protocol (if indicated): none required  VAP protocol (if indicated): NA  DVT prophylaxis: continue coumadin  GI prophylaxis:  NA Glucose control: CBG monitoring and SSI  Mobility: up as tolerated  Code Status: full  Family Communication: no family at bedside today  Disposition:   Labs   CBC: Recent Labs  Lab 03/28/18 1040 03/29/18 0355 03/30/18 0630 03/31/18 1024 04/01/18 0551  WBC 5.8 6.0 6.0 4.5 4.1  NEUTROABS 4.7  --   --   --   --   HGB 12.8* 12.9* 12.9* 11.3* 10.7*  HCT 41.4 41.9 41.1 36.8* 34.7*  MCV 93.7 95.0 94.5 94.6 94.3  PLT 158 148* 123* PLATELET CLUMPS NOTED ON SMEAR, COUNT APPEARS DECREASED 109*    Basic Metabolic Panel: Recent Labs  Lab 03/28/18 1040 03/29/18 0355 03/29/18 1627 03/30/18 0630 03/31/18 1024 04/01/18 0551  NA 138 139  --  141 138 134*  K 3.7 3.7  --  3.4* 3.5 3.5  CL 103 102  --  105 102 98  CO2 24 28  --  _0 GLUCOSE 191* 177*  --  129* 165* 112*  BUN 22* 23*  --  39* 58* 70*  CREATININE 2.52* 2.62*  --  3.82* 6.52* 8.98*  CALCIUM 8.8* 8.3*  --  8.4* 8.0* 7.7*  MG  --   --  1.5*  --  1.8 1.8  PHOS  --   --  2.5  --   --  4.1   GFR: Estimated Creatinine Clearance: 12.8 mL/min (A) (by C-G formula based on SCr of 8.98 mg/dL (H)). Recent Labs  Lab 03/28/18 1058 03/28/18 1549 03/28/18 1627 03/29/18 0355 03/29/18 1627 03/30/18 0630 03/31/18 0358 03/31/18 1024 04/01/18 0551  PROCALCITON  --  3.38  --   --  16.41 21.06 30.31  --   --   WBC  --   --   --  6.0  --  6.0  --  4.5 4.1  LATICACIDVEN 0.80  --  0.97  --   --   --   --  0.9  --     Liver Function Tests: Recent Labs  Lab 03/28/18 1040 03/29/18 0355 03/30/18 0630 03/31/18 1024 04/01/18 0551  AST 30 35 57* 60* 69*  ALT 25 27 32 32 33  ALKPHOS 104 100 96 89 92  BILITOT 2.5* 2.2*  2.5* 2.3* 1.7* 1.3*  PROT 7.7 7.1 7.2 6.4* 6.6  ALBUMIN 3.3* 3.1* 2.8* 2.5* 2.3*   No results for input(s): LIPASE, AMYLASE in the last 168 hours. No results for input(s): AMMONIA in the last 168 hours.  ABG    Component Value Date/Time   PHART 7.344 (L) 03/29/2018 1610   PCO2ART 52.7 (H) 03/29/2018  1610   PO2ART 74.3 (L) 03/29/2018 1610   HCO3 26.8 03/29/2018 1610   TCO2 25 02/25/2018 1257   ACIDBASEDEF 3.0 (H) 02/25/2018 1257   O2SAT 90.8 03/29/2018 1610     Coagulation Profile: Recent Labs  Lab 03/28/18 1540 03/29/18 0355 03/30/18 0630 03/31/18 0358 04/01/18 0238  INR 3.03 2.71 2.27 2.29 3.34    Cardiac Enzymes: Recent Labs  Lab 03/28/18 1846 03/29/18 1627 03/29/18 2134 03/30/18 0630  TROPONINI 0.08* 0.18* 0.25* 0.28*  HbA1C: Hemoglobin A1C  Date/Time Value Ref Range Status  12/26/2017 01:39 PM 6.7 (A) 4.0 - 5.6 % Final  08/23/2017 09:12 AM 7.0 (A) 4.0 - 5.6 % Final   Hgb A1c MFr Bld  Date/Time Value Ref Range Status  02/27/2018 03:32 AM 13.3 (H) 4.8 - 5.6 % Final    Comment:    (NOTE) Pre diabetes:          5.7%-6.4% Diabetes:              >6.4% Glycemic control for   <7.0% adults with diabetes   05/19/2016 03:08 PM 9.0 (H) 4.8 - 5.6 % Final    Comment:    (NOTE)         Pre-diabetes: 5.7 - 6.4         Diabetes: >6.4         Glycemic control for adults with diabetes: <7.0     CBG: Recent Labs  Lab 03/31/18 0723 03/31/18 1140 03/31/18 1536 03/31/18 2228 04/01/18 0737  GLUCAP 204* 109* 104* 97 91    Review of Systems:   Denies chest pain, a sensation of difficulty breathing. States his cough seems improved.   Past Medical History  He,  has a past medical history of AICD (automatic cardioverter/defibrillator) present, Atrial flutter ([place]), Chronic systolic CHF (congestive heart failure) ([place]), HCAP (healthcare-associated pneumonia) (03/28/2018), History of gout, Hyperlipidemia, Hypertension, Myocardial infarction ([place]), NICM (nonischemic cardiomyopathy) ([place]), Obesity, Persistent atrial fibrillation, and Type II diabetes mellitus ([place]).   Surgical History    Past Surgical History:  Procedure Laterality Date  . CARDIOVERSION N/A 04/07/2016   Procedure: CARDIOVERSION;  Surgeon: Thayer Headings, MD;  Location: [facility] ENDOSCOPY;  Service:  Cardiovascular;  Laterality: N/A;  . CARDIOVERSION N/A 04/11/2016   Procedure: CARDIOVERSION;  Surgeon: Larey Dresser, MD;  Location: [place];  Service: Cardiovascular;  Laterality: N/A;  . ELECTROPHYSIOLOGIC STUDY N/A 03/31/2016   Procedure: A-Flutter Ablation;  Surgeon: Evans Lance, MD;  Location: [place] CV LAB;  Service: Cardiovascular;  Laterality: N/A;  . IMPLANTABLE CARDIOVERTER DEFIBRILLATOR (ICD) GENERATOR CHANGE N/A 08/27/2013   Procedure: ICD GENERATOR CHANGE;  Surgeon: Evans Lance, MD;  Location[facility] C[facility] LAB;  Service: Cardiovascular;  Laterality: N/A;  . TEE WITHOUT CARDIOVERSION N/A 03/31/2016   Procedure: TRANSESOPHAGEAL ECHOCARDIOGRAM (TEE);  Surgeon: Larey Dresser, MD;  Location: [place];  Service: Cardiovascular;  Laterality: N/A;     Social History   reports that he quit smoking about 15 years ago. His smoking use included cigars. He quit after 10.00 years of use. He has never used smokeless tobacco. He reports that he does not drink alcohol or use drugs.   Family History   His family history includes Diabetes in his mother; Heart disease in his mother; Hypertension in his mother.   Allergies Allergies  Allergen Reactions  . No Known Allergies      Home Medications  Prior to Admission medications   Medication Sig Start Date End Date Taking? Authorizing Provider  amiodarone (PACERONE) 200 MG tablet Take 1 tablet (200 mg total) by mouth daily. 12/26/17  Yes Carroll Sage, MD  amLODipine (NORVASC) 5 MG tablet Take 1 tablet (5 mg total) by mouth daily. 08/23/17  Yes Lenore Cordia, MD  carvedilol (COREG) 25 MG tablet TAKE 1 TABLET BY MOUTH TWICE A DAY WITH A MEAL. Patient taking differently: Take 25 mg by mouth 2 (two) times daily with a meal.  12/14/17  Yes Larey Dresser, MD  colchicine 0.6 MG tablet Take 1 tablet (0.6 mg total) by mouth daily. 02/28/18  Yes Thurnell Lose, MD  hydrALAZINE (APRESOLINE) 100 MG tablet TAKE 1 TABLET BY MOUTH 3  TIMES DAILY. Patient taking differently: Take 100 mg by mouth 3 (three) times daily.  11/09/17  Yes Larey Dresser, MD  insulin aspart (NOVOLOG) 100 UNIT/ML injection Substitute to any brand approved.Before each meal 3 times a day, 140-199 - 2 units, 200-250 - 4 units, 251-299 - 6 units,  300-349 - 8 units,  350 or above 10 units. Dispense syringes and needles as needed, Ok to switch to PEN if approved. DX DM2, Code E11.65 Patient taking differently: Inject 10 Units into the skin See admin instructions. Substitute to any brand approved.Before each meal 3 times a day, 140-199 - 2 units, 200-250 - 4 units, 251-299 - 6 units,  300-349 - 8 units,  350 or above 10 units. Dispense syringes and needles as needed, Ok to switch to PEN if approved. DX DM2, Code E11.65 02/28/18  Yes Thurnell Lose, MD  insulin glargine (LANTUS) 100 UNIT/ML injection Inject 0.25 mLs (25 Units total) into the skin 2 (two) times daily. Dispense insulin pen if approved, if not dispense as needed syringes and needles for 1 month supply. Can switch to Levemir. Diagnosis E 11.65. Patient taking differently: Inject 25 Units into the skin at bedtime.  02/28/18  Yes Thurnell Lose, MD  potassium chloride SA (K-DUR,KLOR-CON) 20 MEQ tablet TAKE 2 TABLETS BY MOUTH IN THE MORNING AND 1 TABLET IN THE EVENING Patient taking differently: Take 40 mEq by mouth See admin instructions. TAKE 40 mg  IN THE MORNING AND 20 mg TABLET IN THE EVENING 01/16/18  Yes Larey Dresser, MD  ranolazine (RANEXA) 500 MG 12 hr tablet TAKE 1 TABLET (500 MG TOTAL) BY MOUTH 2 (TWO) TIMES DAILY. 12/19/17  Yes Larey Dresser, MD  torsemide (DEMADEX) 20 MG tablet Take 3 tablets (60 mg total) by mouth daily. 12/26/17  Yes Carroll Sage, MD  warfarin (COUMADIN) 5 MG tablet Take 1 tablet (5 mg total) by mouth daily at 6 PM. Take As Directed by [facility] Patient taking differently: Take 5-7.5 mg by mouth daily at 6 PM. Take 7.5 mg on Mon. Wed. Fri Take 5 mg tues,  thurs, sat. sunday 03/01/18  Yes Thurnell Lose, MD  allopurinol (ZYLOPRIM) 300 MG tablet Take 300 mg by mouth daily.     [provider]  amoxicillin-clavulanate (AUGMENTIN) 875-125 MG tablet Take 1 tablet by mouth every 12 (twelve) hours. Patient not taking: Reported on 03/28/2018 02/28/18   Thurnell Lose, MD  atorvastatin (LIPITOR) 40 MG tablet Take 1 tablet (40 mg total) by mouth daily. Patient not taking: Reported on 03/28/2018 08/23/17   Lenore Cordia, MD  blood glucose meter kit and supplies KIT Dispense based on patient and insurance preference. Use up to four times daily as directed. (FOR ICD-9 250.00, 250.01). For QAC - HS accuchecks. 02/28/18   Thurnell Lose, MD  calcitRIOL (ROCALTROL) 0.25 MCG capsule Take 0.25 mcg by mouth daily.    [provider]  diclofenac sodium (VOLTAREN) 1 % GEL Apply 2 g topically 4 (four) times daily. 02/28/18   Thurnell Lose, MD  Insulin Syringe-Needle U-100 25G X 1" 1 ML MISC For 4 times a day insulin SQ, 1 month supply. Diagnosis E11.65 02/28/18   Thurnell Lose, MD  Insulin Syringe-Needle U-100 31G X 15/64" 0.3 ML MISC  Use to inject insulin 4 times a day 07/27/16   Sid Falcon, MD  isosorbide mononitrate (IMDUR) 30 MG 24 hr tablet TAKE 3 TABLETS (90 MG TOTAL) BY MOUTH DAILY. 09/07/17 02/25/18  Larey Dresser, MD  magnesium oxide (MAG-OX) 400 (241.3 Mg) MG tablet Take 1 tablet (400 mg total) by mouth daily. 04/15/16   Shirley Friar, PA-C  metolazone (ZAROXOLYN) 2.5 MG tablet TAKE 1 TABLET BY MOUTH AS NEEDED AS DIRECTED Patient taking differently: Take 2.5 mg by mouth as needed (fluid).  12/15/16   Bensimhon, Shaune Pascal, MD     Critical care time: **

## 2018-04-01 NOTE — Anesthesia Procedure Notes (Signed)
Procedure Name: Intubation Date/Time: 04/01/2018 1:25 PM Performed by: Oletta Lamas, CRNA Pre-anesthesia Checklist: Emergency Drugs available, Suction available, Patient being monitored and Patient identified Oxygen Delivery Method: Ambu bag Preoxygenation: Pre-oxygenation with 100% oxygen Ventilation: Two handed mask ventilation required Laryngoscope Size: Mac and 4 Grade View: Grade II Tube type: Oral Number of attempts: 1 Airway Equipment and Method: Stylet and Oral airway Placement Confirmation: ETT inserted through vocal cords under direct vision,  positive ETCO2 and breath sounds checked- equal and bilateral Secured at: 26 cm Tube secured with: Tape Difficulty Due To: Difficulty was unanticipated Comments: Originally placed at 26 lip.  Respiratory therapist at bedisde.  Instructed to pull tube back to 23.  Chest x-ray will be obtained to confirmed placement.

## 2018-04-01 NOTE — Procedures (Signed)
Central Venous Catheter Insertion Procedure Note Travis Bryan [id number] 07/09/1970  Procedure: Insertion of Central Venous Catheter Indications: Assessment of intravascular volume, Frequent blood sampling and dialysis   Procedure Details Consent: Unable to obtain consent because of altered level of consciousness. Time Out: Verified patient identification, verified procedure, site/side was marked, verified correct patient position, special equipment/implants available, medications/allergies/relevent history reviewed, required imaging and test results available.  Performed  Maximum sterile technique was used including antiseptics, cap, gloves, gown, hand hygiene, mask and sheet. Skin prep: Chlorhexidine; local anesthetic administered A antimicrobial bonded/coated triple lumen catheter was placed in the right femoral vein due to patient being a dialysis patient and emergent situation using the Seldinger technique.  Evaluation Blood flow good Complications: No apparent complications Patient did tolerate procedure well. Chest X-ray ordered to verify placement.  CXR: pending.  Ledell Noss 04/01/2018, 3:12 PM

## 2018-04-01 NOTE — Progress Notes (Signed)
Hypoglycemic Event  CBG: 66  Treatment: 25 mL of D50  Symptoms: None  Follow-up CBG: Time: 1228 CBG Result: 117  Possible Reasons for Event: NPO status?  Comments/MD notified: Internal Medicine Resident Ledell Noss MD notified    Travis Bryan

## 2018-04-01 NOTE — Code Documentation (Signed)
  Patient Name: Travis Bryan   MRN: [id number]   Date of Birth/ Sex: Aug 03, 1970 , male      Admission Date: 03/28/2018  Attending Provider: Kipp Brood, MD  Primary Diagnosis: Acute respiratory failure with hypoxia [facility])   Indication: Pt was in his usual state of health until this PM, when he was noted to be PEA arrest. Code blue was subsequently called. At the time of arrival on scene, ACLS protocol was underway.   Technical Description:  - CPR performance duration:  14 minutes  - Was defibrillation or cardioversion used? No   - Was external pacer placed? No  - Was patient intubated pre/post CPR? Yes   Medications Administered: Y = Yes; Blank = No Amiodarone    Atropine    Calcium  Y  Epinephrine  Y  Lidocaine    Magnesium    Norepinephrine    Phenylephrine    Sodium bicarbonate  Y  Vasopressin     Post CPR evaluation:  - Final Status - Was patient successfully resuscitated ? Yes - What is current rhythm? Sinus tachycardia  - What is current hemodynamic status? Stable  Miscellaneous Information:  - Labs sent, including: Yes  - Primary team notified?  Yes  - Family Notified? Yes  - Additional notes/ transfer status: Remain in ICU     Jean Rosenthal, MD  04/01/2018, 1:36 PM

## 2018-04-01 NOTE — Progress Notes (Signed)
ABG results called Elink.  Recruitment maneuver done and peep increased to 14. Repeat ABG in 2 hours.

## 2018-04-01 NOTE — Significant Event (Signed)
Pulmonary Plan of Care  [name] is a 48 year old male with NICM (EF25-30%), atrial flutter s/p ablation, CKD IV admitted for acute hypoxemic respiratory failure secondary to CHF exacerbation and pneumonia. Today, he had PEA arrest with ROSC. During the code, he was intubated for respiratory failure and found with copious amounts of pink frothy sputum in ET tube. EKG and labs included CBC, BMP, troponin, lactate and ABG obtained. Titrated ventilation settings to FIO2 100% and PEEP 16 to maintain O2 saturations >88%. Increased RR when pH returned 7.0. After Vas Cath and arterial line placed, CRRT started. Also started levophed for hypotension. I updated family members including daughter, brother and sisters on patient's critical conditions and addressed their questions regarding medical management. We re-confirmed patient's full code status.   The patient is critically ill with multiple organ systems failure and requires high complexity decision making for assessment and support, frequent evaluation and titration of therapies, application of advanced monitoring technologies and extensive interpretation of multiple databases. Also held family meeting regarding goals of care.   Critical Care Time devoted to patient care services described in this note is 50 Minutes. This time reflects time of care of this signee Dr. Rodman Pickle. This critical care time does not reflect procedure time, or teaching time or supervisory time of PA/NP/Med student/Med Resident etc but could involve care discussion time.

## 2018-04-01 NOTE — Progress Notes (Signed)
[name] ACT level supratherapeutic on low initial CRRT heparin dose, notified Dr. Justin Mend and received orders to discontinue heparin through CRRT.

## 2018-04-02 ENCOUNTER — Inpatient Hospital Stay (HOSPITAL_COMMUNITY): Payer: Medicaid Other

## 2018-04-02 LAB — CULTURE, BLOOD (ROUTINE X 2)
Culture: NO GROWTH
Culture: NO GROWTH
Special Requests: ADEQUATE
Special Requests: ADEQUATE

## 2018-04-02 LAB — COOXEMETRY PANEL
Carboxyhemoglobin: 1.1 % (ref 0.5–1.5)
Carboxyhemoglobin: 1.2 % (ref 0.5–1.5)
Carboxyhemoglobin: 1.4 % (ref 0.5–1.5)
METHEMOGLOBIN: 1.6 % — AB (ref 0.0–1.5)
Methemoglobin: 1.1 % (ref 0.0–1.5)
Methemoglobin: 1.6 % — ABNORMAL HIGH (ref 0.0–1.5)
O2 Saturation: 48.6 %
O2 Saturation: 50.8 %
O2 Saturation: 54.1 %
Total hemoglobin: 11.8 g/dL — ABNORMAL LOW (ref 12.0–16.0)
Total hemoglobin: 11.9 g/dL — ABNORMAL LOW (ref 12.0–16.0)
Total hemoglobin: 12.3 g/dL (ref 12.0–16.0)

## 2018-04-02 LAB — COMPREHENSIVE METABOLIC PANEL
ALT: 36 U/L (ref 0–44)
AST: 72 U/L — ABNORMAL HIGH (ref 15–41)
Albumin: 2.5 g/dL — ABNORMAL LOW (ref 3.5–5.0)
Alkaline Phosphatase: 107 U/L (ref 38–126)
Anion gap: 11 (ref 5–15)
BUN: 49 mg/dL — ABNORMAL HIGH (ref 6–20)
CO2: 22 mmol/L (ref 22–32)
Calcium: 8.4 mg/dL — ABNORMAL LOW (ref 8.9–10.3)
Chloride: 103 mmol/L (ref 98–111)
Creatinine, Ser: 6.34 mg/dL — ABNORMAL HIGH (ref 0.61–1.24)
GFR calc Af Amer: 11 mL/min — ABNORMAL LOW (ref >60)
GFR calc non Af Amer: 10 mL/min — ABNORMAL LOW (ref >60)
Glucose, Bld: 113 mg/dL — ABNORMAL HIGH (ref 70–99)
Potassium: 3.8 mmol/L (ref 3.5–5.1)
Sodium: 136 mmol/L (ref 135–145)
Total Bilirubin: 2 mg/dL — ABNORMAL HIGH (ref 0.3–1.2)
Total Protein: 7.4 g/dL (ref 6.5–8.1)

## 2018-04-02 LAB — GLUCOSE, CAPILLARY
Glucose-Capillary: 101 mg/dL — ABNORMAL HIGH (ref 70–99)
Glucose-Capillary: 91 mg/dL (ref 70–99)
Glucose-Capillary: 85 mg/dL (ref 70–99)
Glucose-Capillary: 68 mg/dL — ABNORMAL LOW (ref 70–99)
Glucose-Capillary: 141 mg/dL — ABNORMAL HIGH (ref 70–99)
Glucose-Capillary: 143 mg/dL — ABNORMAL HIGH (ref 70–99)

## 2018-04-02 LAB — CBC
HCT: 39.8 % (ref 39.0–52.0)
Hemoglobin: 11.9 g/dL — ABNORMAL LOW (ref 13.0–17.0)
MCH: 28.7 pg (ref 26.0–34.0)
MCHC: 29.9 g/dL — ABNORMAL LOW (ref 30.0–36.0)
MCV: 95.9 fL (ref 80.0–100.0)
Platelets: 131 10*3/uL — ABNORMAL LOW (ref 150–400)
RBC: 4.15 MIL/uL — AB (ref 4.22–5.81)
RDW: 13.9 % (ref 11.5–15.5)
WBC: 4.4 10*3/uL (ref 4.0–10.5)
nRBC: 0 % (ref 0.0–0.2)

## 2018-04-02 LAB — RENAL FUNCTION PANEL
Albumin: 2.6 g/dL — ABNORMAL LOW (ref 3.5–5.0)
Anion gap: 12 (ref 5–15)
BUN: 35 mg/dL — ABNORMAL HIGH (ref 6–20)
CO2: 22 mmol/L (ref 22–32)
CREATININE: 4.87 mg/dL — AB (ref 0.61–1.24)
Calcium: 8.1 mg/dL — ABNORMAL LOW (ref 8.9–10.3)
Chloride: 102 mmol/L (ref 98–111)
GFR calc Af Amer: 15 mL/min — ABNORMAL LOW (ref >60)
GFR calc non Af Amer: 13 mL/min — ABNORMAL LOW (ref >60)
GLUCOSE: 232 mg/dL — AB (ref 70–99)
Phosphorus: 2.6 mg/dL (ref 2.5–4.6)
Potassium: 3.7 mmol/L (ref 3.5–5.1)
Sodium: 136 mmol/L (ref 135–145)

## 2018-04-02 LAB — PHOSPHORUS: Phosphorus: 2.7 mg/dL (ref 2.5–4.6)

## 2018-04-02 LAB — APTT: aPTT: 86 seconds — ABNORMAL HIGH (ref 24–36)

## 2018-04-02 LAB — TYPE AND SCREEN
ABO/RH(D): O POS
Antibody Screen: NEGATIVE

## 2018-04-02 LAB — PROTIME-INR
INR: 4.67 — AB
Prothrombin Time: 43.3 seconds — ABNORMAL HIGH (ref 11.4–15.2)

## 2018-04-02 LAB — ABO/RH: ABO/RH(D): O POS

## 2018-04-02 LAB — MAGNESIUM: Magnesium: 2.7 mg/dL — ABNORMAL HIGH (ref 1.7–2.4)

## 2018-04-02 LAB — LACTIC ACID, PLASMA: LACTIC ACID, VENOUS: 1.2 mmol/L (ref 0.5–1.9)

## 2018-04-02 LAB — UREA NITROGEN, URINE: Urea Nitrogen, Ur: 279 mg/dL

## 2018-04-02 MED ORDER — PRO-STAT SUGAR FREE PO LIQD
30.0000 mL | Freq: Four times a day (QID) | ORAL | Status: DC
  Administered 2018-04-02 – 2018-04-16 (×47): 30 mL
  Filled 2018-04-02 (×46): qty 30

## 2018-04-02 MED ORDER — VITAL HIGH PROTEIN PO LIQD
1000.0000 mL | ORAL | Status: DC
  Administered 2018-04-02 – 2018-04-11 (×14): 1000 mL

## 2018-04-02 MED ORDER — DOBUTAMINE IN D5W 4-5 MG/ML-% IV SOLN
2.5000 ug/kg/min | INTRAVENOUS | Status: DC
  Filled 2018-04-02: qty 250

## 2018-04-02 MED ORDER — SODIUM CHLORIDE 0.9 % IV SOLN
0.5000 mg/h | INTRAVENOUS | Status: DC
  Administered 2018-04-02: 0.5 mg/h via INTRAVENOUS
  Administered 2018-04-04: 2 mg/h via INTRAVENOUS
  Administered 2018-04-05: 1.5 mg/h via INTRAVENOUS
  Administered 2018-04-06: 1 mg/h via INTRAVENOUS
  Administered 2018-04-09: 1.5 mg/h via INTRAVENOUS
  Administered 2018-04-10: 2 mg/h via INTRAVENOUS
  Filled 2018-04-02 (×9): qty 5

## 2018-04-02 MED ORDER — NOREPINEPHRINE 16 MG/250ML-% IV SOLN
0.0000 ug/min | INTRAVENOUS | Status: DC
  Administered 2018-04-02: 8 ug/min via INTRAVENOUS
  Administered 2018-04-05: 15 ug/min via INTRAVENOUS
  Administered 2018-04-05: 18 ug/min via INTRAVENOUS
  Administered 2018-04-06 (×2): 25 ug/min via INTRAVENOUS
  Administered 2018-04-07 (×2): 26 ug/min via INTRAVENOUS
  Administered 2018-04-08: 14 ug/min via INTRAVENOUS
  Administered 2018-04-10: 6 ug/min via INTRAVENOUS
  Filled 2018-04-02 (×10): qty 250

## 2018-04-02 MED ORDER — INSULIN ASPART 100 UNIT/ML ~~LOC~~ SOLN
0.0000 [IU] | SUBCUTANEOUS | Status: DC
  Administered 2018-04-02 (×2): 1 [IU] via SUBCUTANEOUS
  Administered 2018-04-03 (×5): 2 [IU] via SUBCUTANEOUS
  Administered 2018-04-04: 3 [IU] via SUBCUTANEOUS
  Administered 2018-04-04: 2 [IU] via SUBCUTANEOUS
  Administered 2018-04-04: 3 [IU] via SUBCUTANEOUS
  Administered 2018-04-04: 2 [IU] via SUBCUTANEOUS

## 2018-04-02 MED ORDER — DOBUTAMINE IN D5W 4-5 MG/ML-% IV SOLN
2.0000 ug/kg/min | INTRAVENOUS | Status: DC
  Filled 2018-04-02: qty 250

## 2018-04-02 MED ORDER — SODIUM CHLORIDE 0.9% IV SOLUTION
Freq: Once | INTRAVENOUS | Status: AC
  Administered 2018-04-02: 08:00:00 via INTRAVENOUS

## 2018-04-02 MED ORDER — DOBUTAMINE IN D5W 4-5 MG/ML-% IV SOLN
4.0000 ug/kg/min | INTRAVENOUS | Status: DC
  Administered 2018-04-02: 2 ug/kg/min via INTRAVENOUS
  Filled 2018-04-02: qty 250

## 2018-04-02 MED ORDER — SODIUM CHLORIDE 0.9 % IV SOLN
INTRAVENOUS | Status: DC | PRN
  Administered 2018-04-02 – 2018-04-03 (×2): 250 mL via INTRAVENOUS
  Administered 2018-04-13 (×2): 1000 mL via INTRAVENOUS
  Administered 2018-04-14: 500 mL via INTRAVENOUS

## 2018-04-02 MED ORDER — SENNOSIDES-DOCUSATE SODIUM 8.6-50 MG PO TABS
1.0000 | ORAL_TABLET | Freq: Two times a day (BID) | ORAL | Status: DC
  Administered 2018-04-02 – 2018-04-06 (×8): 1 via ORAL
  Filled 2018-04-02 (×13): qty 1

## 2018-04-02 MED ORDER — HYDROXYZINE HCL 25 MG PO TABS
25.0000 mg | ORAL_TABLET | Freq: Three times a day (TID) | ORAL | Status: DC | PRN
  Administered 2018-04-02 – 2018-04-05 (×3): 25 mg
  Filled 2018-04-02 (×4): qty 1

## 2018-04-02 MED ORDER — PANTOPRAZOLE SODIUM 40 MG PO PACK
40.0000 mg | PACK | Freq: Every day | ORAL | Status: DC
  Administered 2018-04-02 – 2018-04-21 (×15): 40 mg
  Filled 2018-04-02 (×21): qty 20

## 2018-04-02 MED ORDER — DEXTROSE 50 % IV SOLN
INTRAVENOUS | Status: AC
  Administered 2018-04-02: 50 mL
  Filled 2018-04-02: qty 50

## 2018-04-02 MED ORDER — HYDROMORPHONE BOLUS VIA INFUSION
1.0000 mg | INTRAVENOUS | Status: DC | PRN
  Administered 2018-04-05 – 2018-04-11 (×7): 1 mg via INTRAVENOUS
  Filled 2018-04-02: qty 1

## 2018-04-02 NOTE — Progress Notes (Signed)
[place] for Warfarin Indication: atrial fibrillation  Allergies  Allergen Reactions  . No Known Allergies    Patient Measurements: Height: 5\' 10"  (177.8 cm) Weight: 244 lb 4.3 oz (110.8 kg) IBW/kg (Calculated) : 73  Vital Signs: Temp: 97.8 F (36.6 C) (01/21 1125) Temp Source: Axillary (01/21 1125) BP: 97/71 (01/21 1100) Pulse Rate: 73 (01/21 1107)  Labs: Recent Labs    04/01/18 0238 04/01/18 0551 04/01/18 1336 04/01/18 1659 04/02/18 0346 04/02/18 0820  HGB  --  10.7* 12.5*  --   --  11.9*  HCT  --  34.7* 42.4  --   --  39.8  PLT  --  109* 144*  --   --  131*  APTT  --   --   --   --  86*  --   LABPROT 33.3*  --  37.5*  --  43.3*  --   INR 3.34  --  3.88  --  4.67*  --   CREATININE  --  8.98* 10.07* 8.71* 6.34*  --   TROPONINI  --   --  0.25*  --   --   --    Estimated Creatinine Clearance: 17.9 mL/min (A) (by C-G formula based on SCr of 6.34 mg/dL (H)).  Assessment: 56 yoM presenting with NV and fever, h/o afib on warfarin PTA. Pharmacy consulted to continue warfarin dosing upon admission. INR trended up from 3.34 to 4.67 today. Patient received FFP overnight x2.  Of note, patient did not receive ordered warfarin dose on 1/17. Hgb trending down slightly; PLT stable. No bleeding noted.   Noted interaction with amiodarone, resumed from PTA, and doxycycline which was discontinued 1/18.     PTA warfarin dose: 7.5mg  MWF, 5mg  TTSS (admit INR 3.03)   Of note, CRRT initiated 1/20   Goal of Therapy:  INR 2-3 Monitor platelets by anticoagulation protocol: Yes   Plan:  Continue to hold warfarin today Daily INR Monitor CBC trend and s/sx of bleeding  Carlyon Shadow, PharmD Candidate 2020 04/02/2018,11:32 AM

## 2018-04-02 NOTE — Progress Notes (Signed)
CRITICAL VALUE ALERT  Critical Value:  [place] 54.1  Date & Time Notied:  04/02/18 1729  Provider Notified: Yes  Orders Received/Actions taken: Awaiting orders at this time

## 2018-04-02 NOTE — Care Management Note (Addendum)
Case Management Note  Patient Details  Name: Travis Bryan MRN: [id number] Date of Birth: September 29, 1970  Subjective/Objective:    Pt readmitted with PNA                Action/Plan:   PTA independent from home.  Pt already has follow up appt with 2/6 at 1:50 at [facility], he can use the [facility] clinic for discount medications.  Previous CM DT placed pt in system for [facility] however pt will likely need to be reinstated prior to discharge due to expiration of dates.    CM will continue to follow for discharge needs   Expected Discharge Date:  03/30/18               Expected Discharge Plan:  Home/Self Care  In-House Referral:     Discharge planning Services  CM Consult, Follow-up appt scheduled, Medication Assistance, [facility]  Post Acute Care Choice:    Choice offered to:     DME Arranged:    DME Agency:     HH Arranged:    HH Agency:     Status of Service:  In process, will continue to follow  If discussed at Long Length of Stay Meetings, dates discussed:    Additional Comments:  Maryclare Labrador, RN 04/02/2018, 2:42 PM

## 2018-04-02 NOTE — Progress Notes (Signed)
SAT dropped into the 70's. Patient taken off and bagged with ambu and peep valve. Lavaged for small amount tan secretions. Returned to vent and left on 100%

## 2018-04-02 NOTE — Progress Notes (Signed)
Initial Nutrition Assessment  DOCUMENTATION CODES:   Obesity unspecified  INTERVENTION:   Initiate TF via OGT:  Vital High Protein at 45 ml/h (1080 ml per day)  Pro-stat 30 ml QID  Provides 1480 kcal, 155 gm protein, 903 ml free water daily  NUTRITION DIAGNOSIS:   Inadequate oral intake related to inability to eat as evidenced by NPO status.  GOAL:   Provide needs based on ASPEN/SCCM guidelines  MONITOR:   Vent status, TF tolerance, Labs, I & O's  REASON FOR ASSESSMENT:   Ventilator, Consult Enteral/tube feeding initiation and management  ASSESSMENT:   48 yo male with PMH of CHF, NICM, HLD, HTN, obesity, AICD, DM-2, and A fib who was admitted with PNA with decompensated HF. S/P PEA arrest on 1/20, requiring CPR x 6 minutes and intubation.  Patient is currently intubated on ventilator support MV: 15.9 L/min Temp (24hrs), Avg:97.8 F (36.6 C), Min:97.3 F (36.3 C), Max:99.4 F (37.4 C)    Labs reviewed. Magnesium 2.7 (H), BUN 49 (H), creatinine 6.34 (H), potassium 3.8 WNL Medications reviewed and include Mag-ox, Levophed.  Currently requiring CRRT.  I/O -2.55 L since admission  NUTRITION - FOCUSED PHYSICAL EXAM:    Most Recent Value  Orbital Region  No depletion  Upper Arm Region  No depletion  Thoracic and Lumbar Region  Unable to assess  Buccal Region  Unable to assess  Temple Region  No depletion  Clavicle Bone Region  No depletion  Clavicle and Acromion Bone Region  No depletion  Scapular Bone Region  Unable to assess  Dorsal Hand  No depletion  Patellar Region  No depletion  Anterior Thigh Region  No depletion  Posterior Calf Region  No depletion  Edema (RD Assessment)  Mild  Hair  Reviewed  Eyes  Unable to assess  Mouth  Unable to assess  Skin  Reviewed  Nails  Reviewed       Diet Order:   Diet Order            Diet NPO time specified  Diet effective now              EDUCATION NEEDS:   No education needs have been identified  at this time  Skin:  Skin Assessment: Reviewed RN Assessment  Last BM:  1/19  Height:   Ht Readings from Last 1 Encounters:  03/28/18 5\' 10"  (1.778 m)    Weight:   Wt Readings from Last 1 Encounters:  04/02/18 110.8 kg    Ideal Body Weight:  75.5 kg  BMI:  Body mass index is 35.05 kg/m.  Estimated Nutritional Needs:   Kcal:  1220-1550  Protein:  151 gm  Fluid:  2 L    Molli Barrows, RD, LDN, [place] Pager [phone] After Hours Pager [phone]

## 2018-04-02 NOTE — Progress Notes (Signed)
Patient ID: Travis Bryan, male   DOB: Jan 19, 1971, 48 y.o.   MRN: [id number] [facility] Progress Note   Assessment/ Plan:   1.  Acute kidney injury on chronic kidney disease stage IIIb/IV: Started on CRRT yesterday afternoon after poor response to trials of diuretics, has tolerated ultrafiltration overnight and is on low-dose pressors that are being weaned down.  He remains anuric and without any evidence of renal recovery for which I will continue CRRT at current prescription. 2.  Acute hypoxic respiratory failure: Intubated yesterday following episode of aspiration/PEA arrest; initiated on CRRT yesterday with successful ultrafiltration overnight net -1.9 L.  Continue efforts at weaning off of ventilator. 3.  Hyponatremia: Mild and secondary to acute kidney injury/free water excretion defect.    Appears to be improving with ultrafiltration/CRRT. 4.  History of atrial fibrillation/atrial flutter status post DCCV and ablation  Subjective:   Suffered PEA arrest yesterday afternoon likely after suspected aspiration with successful resuscitation and intubation for airway protection.   Objective:   BP 110/88   Pulse 72   Temp (!) 97.5 F (36.4 C) (Axillary)   Resp (!) 25   Ht '5\' 10"'  (1.778 m)   Wt 110.8 kg   SpO2 98%   BMI 35.05 kg/m   Intake/Output Summary (Last 24 hours) at 04/02/2018 5784 Last data filed at 04/02/2018 0700 Gross per 24 hour  Intake 1615.31 ml  Output 3578 ml  Net -1962.69 ml   Weight change: -2.8 kg  Physical Exam: Gen: Intubated, awake and mouthing words to questions CVS: Pulse regular rhythm, normal rate, normal S1 and S2 Resp: Anteriorly clear to auscultation, no rales/rhonchi Abd: Firm, distended, mild epigastric tenderness, bowel sounds normal Ext: Trace-1+ lower extremity edema  Imaging: US Renal  Result Date: 04/02/2018 CLINICAL DATA:  Oliguria EXAM: RENAL / URINARY TRACT ULTRASOUND COMPLETE COMPARISON:  None. FINDINGS: Right Kidney:  Renal measurements: 10.6 cm length by 5.3 cm height by 5.8 cm width = volume: 168.4 mL. Increased cortical echogenicity without hydronephrosis or mass. Left Kidney: Renal measurements: 13 cm length by 6 cm height by 5.9 cm wide = volume: 236.8 mL. Increased cortical echogenicity. No mass or hydronephrosis. Bladder: Bladder is empty IMPRESSION: 1. Echogenic kidneys bilaterally consistent with medical renal disease. No hydronephrosis. Electronically Signed   By: Donavan Foil M.D.   On: 04/02/2018 01:17   Dg Chest Port 1 View  Result Date: 04/01/2018 CLINICAL DATA:  Patient status post intubation EXAM: PORTABLE CHEST 1 VIEW COMPARISON:  Chest radiograph 04/01/2018 FINDINGS: ET tube terminates in the mid trachea. Enteric tube courses inferior to the diaphragm. Pacer pads overlie the patient. Single lead AICD device overlies the left hemithorax, leads stable in position. Monitoring leads overlie the patient. Stable cardiomegaly. Persistent bilateral airspace disease, similar within the right lung and slightly improved within the left lung. No definite pneumothorax. IMPRESSION: ET tube terminates in the mid trachea. Slight interval improvement consolidation left lung with persistent diffuse consolidation right lung. Electronically Signed   By: Lovey Newcomer M.D.   On: 04/01/2018 16:28   Dg Chest Port 1 View  Result Date: 04/01/2018 CLINICAL DATA:  CHF, respiratory failure, pneumonia EXAM: PORTABLE CHEST 1 VIEW COMPARISON:  03/30/2018 FINDINGS: Cardiomegaly noted with AICD as before. Worsening perihilar and lower lobe airspace consolidation with central air bronchograms. Some improvement in the right upper lobe aeration. Appearance remains nonspecific but compatible with a combination of CHF with superimposed pneumonia. No enlarging effusion. No pneumothorax. Trachea is midline. IMPRESSION: Persistent bilateral airspace disease,  slightly worse in the lower lobes but improved in the right upper lobe, suspect  component of combined CHF/multilobar pneumonia. Electronically Signed   By: Jerilynn Mages.  Shick M.D.   On: 04/01/2018 10:43    Labs: BMET Recent Labs  Lab 03/29/18 0355 03/29/18 1627 03/30/18 0630 03/31/18 1024 04/01/18 0551 04/01/18 1336 04/01/18 1659 04/02/18 0346  NA 139  --  141 138 134* 137 137 136  K 3.7  --  3.4* 3.5 3.5 3.5 3.6 3.8  CL 102  --  105 102 98 98 100 103  CO2 28  --  '22 25 22 ' 21* 22 22  GLUCOSE 177*  --  129* 165* 112* 164* 149* 113*  BUN 23*  --  39* 58* 70* 76* 66* 49*  CREATININE 2.62*  --  3.82* 6.52* 8.98* 10.07* 8.71* 6.34*  CALCIUM 8.3*  --  8.4* 8.0* 7.7* 9.1 8.1* 8.4*  PHOS  --  2.5  --   --  4.1  --  4.2 2.7   CBC Recent Labs  Lab 03/28/18 1040  03/30/18 0630 03/31/18 1024 04/01/18 0551 04/01/18 1336  WBC 5.8   < > 6.0 4.5 4.1 8.7  NEUTROABS 4.7  --   --   --   --   --   HGB 12.8*   < > 12.9* 11.3* 10.7* 12.5*  HCT 41.4   < > 41.1 36.8* 34.7* 42.4  MCV 93.7   < > 94.5 94.6 94.3 96.8  PLT 158   < > 123* PLATELET CLUMPS NOTED ON SMEAR, COUNT APPEARS DECREASED 109* 144*   < > = values in this interval not displayed.    Medications:    . sodium chloride   Intravenous Once  . allopurinol  300 mg Oral Daily  . amiodarone  200 mg Oral Daily  . atorvastatin  40 mg Oral q1800  . carvedilol  3.125 mg Oral BID WC  . chlorhexidine gluconate (MEDLINE KIT)  15 mL Mouth Rinse BID  . Chlorhexidine Gluconate Cloth  6 each Topical Daily  . magnesium oxide  400 mg Oral Daily  . mouth rinse  15 mL Mouth Rinse 10 times per day  . sodium chloride flush  10-40 mL Intracatheter Q12H  . Warfarin - Pharmacist Dosing Inpatient   Does not apply U0156   Elmarie Shiley, MD 04/02/2018, 7:28 AM

## 2018-04-02 NOTE — Progress Notes (Addendum)
NAME:  Travis Bryan, MRN:  [id number], DOB:  [birth year], LOS: 5 ADMISSION DATE:  03/28/2018, CONSULTATION DATE:  03/28/2018 REFERRING MD:  Graciella Freer, CHIEF COMPLAINT:  Respiratory failure.   Brief History   48 year old man present with fever and nausea and vomiting. Increase in weight and dyspnea.Started on BiPAP and antibiotics for suspicion of pneumonia with superimposed decompensated heart failure. PEA arrest on 1/20. Intubated, ROSC achieved after 6 minutes of CPR and Epi x 2.   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 PCCM consulted for persistent hypoxic respiratory failure despite 15 L per minute HFNC and he was transferred to the ICU.   1/20 PEA Arrest (6 minutes), intubated, started on CVVHD  Consults:  CCM  Heart failure  Nephrology   Procedures:  1/20 ETT 1/20 Fem HD Cath   Significant Diagnostic Tests:  None   Micro Data:  1/18 Resp cx - neg (final)  1/17 RVP - metapneumovirus   1/16 blood - negative  1/16 urine culture - neg  MRSA neg   Antimicrobials:  1/17 doxycycline > 1/18  1/17 vanc > 1/18   1/17 cefepime >>   Interim history/subjective:  Awake on ventilator and fentanyl gtt,  following commands.  Objective   Blood pressure (!) 124/93, pulse 68, temperature (!) 97.5 F (36.4 C), temperature source Axillary, resp. rate 19, height 5\' 10"  (1.778 m), weight 110.8 kg, SpO2 100 %.    Vent Mode: PRVC FiO2 (%):  [100 %] 100 % Set Rate:  [24 bmp-90 bmp] 90 bmp Vt Set:  [580 mL-600 mL] 580 mL PEEP:  [5 cmH20-16 cmH20] 12 cmH20 Plateau Pressure:  [26 cmH20-34 cmH20] 26 cmH20   Intake/Output Summary (Last 24 hours) at 04/02/2018 0716 Last data filed at 04/02/2018 0700 Gross per 24 hour  Intake 1615.31 ml  Output 3377 ml    Net -1761.69 ml   Filed Weights   03/31/18 0447 04/01/18 0432 04/02/18 0350  Weight: 114.3 kg 113.6 kg 110.8 kg    Examination:  General: Laying in bed, NAD HENT: ETT in place, NCAT Lungs: diffuse crackles and expiratory wheezing, bronchial breath sounds over the left lower lung fields  Cardiovascular: JVD+, regular rate and rhythm, no murmur  Abdomen: soft, non tender, non distended  Extremities: warm, no peripheral edema  Neuro: awake and alert, sitting up in bed sleeping comfortably with the [facility] Problem list     Assessment & Plan:   Acute hypoxic respiratory failure: Decompensated CHF exacerbation due to viral pneumonia. Covering empirically for superimposed bacterial pneumonia. Suffered PEA arrest on 1/20 after episode of emesis and likely aspiration.   -- Full vent support -- Daily WUA / SBT -- Afebrile since 1/19 -- Continue ceftriaxone (stop day 1/23)  -- Cardiology consulted, appreciate recommendations  -- Lasix trial again today, 120 per cards -- Continue CVVHD (negative 150 / hr)  Shock: Cardiogenic vs. Septic -- Currently on low dose levo, wean as able  Acute on chronic renal failure: Concern for cardiorenal syndrome  -- Nephro consult -- CVVHD, lasix challenge today  -- Strict I&Os -- Maintain foley catheter  PEA Arrest 1/20 (6 minutes) Hx of NICM with ICD  Hx of afib/ aflutter post DCCV and ablation 03/2016  Hx VT with ICD discharge 10/17 and 10/18  --  Continue home amiodarone, NSR this AM  -- INR supratherapeutic this morning, holding coumadin. FFP x 2. Checking CBC.   -- holding metoprolol, hydral and imdur for soft BP   Type 2 Diabetes: With hypoglycemic episodes this admission after mod SSI. Holding insulin while NPO. CBGs at goal.   -- Hold lantus, SSI  -- q4h CBGs    Best practice:  Diet: NPO  Pain/Anxiety/Delirium protocol (if indicated): Fentanyl  VAP protocol (if indicated): Ordered  DVT prophylaxis: continue coumadin   GI prophylaxis: NA Glucose control: Monitor CBGs, holding insulin  Mobility: Bedrest  Code Status: FULL Family Communication: Family updated 1/20 post arrest  Disposition: Remain in ICU   Velna Ochs, M.D. - PGY3 Pager: [phone] 04/02/2018, 7:16 AM

## 2018-04-02 NOTE — Progress Notes (Signed)
[place] Progress Note Patient Name: Travis Bryan DOB: [birth year] MRN: [id number]   Date of Service  04/02/2018  HPI/Events of Note  COOX = 50.8.  eICU Interventions  Will order: 1. Dobutamine IV infusion at 2 mcg/Kg/min. 2. Repeat COOX at 12 midnight.      Intervention Category Major Interventions: Acid-Base disturbance - evaluation and management;Other:  Amanat Hackel Cornelia Copa 04/02/2018, 8:30 PM

## 2018-04-02 NOTE — Progress Notes (Signed)
Patient's O2 sats were dropping so I changed the probe to two different locations, suctioned ETT and mouth, and called RT. Patient's heart rate rhythm changed to a wide QRS. EKG looks similar to one done yesterday and admission. Resident's called and reviewed information. Ordering KUB to ensure no ileus, no KUB since admission. Abdomen is tight with faint bowel sounds and  Patient did aspiratate with intubation yesterday. Will continue to monitor closely. Bartholomew Crews, RN 04/02/2018 3:50 PM

## 2018-04-02 NOTE — Progress Notes (Signed)
[place] Progress Note Patient Name: Travis Bryan DOB: 31-Oct-1970 MRN: [id number]   Date of Service  04/02/2018  HPI/Events of Note  Coagulopathy - INR = 4.67. Patient is on Warfarin dosed by pharmacy. Suspect that INR is prolonged d/t antibiotics and amiodarone.   eICU Interventions  Will order: 1. FFP 2 units IV now.  2. Hold Warfarin dosing today. 3. Repeat INR at 12 noon.      Intervention Category Intermediate Interventions: Coagulopathy - evaluation and management  [name] 04/02/2018, 5:58 AM

## 2018-04-02 NOTE — Progress Notes (Signed)
Hypoglycemic Event  CBG: 68  Treatment: D5 38mL  Symptoms: Sweaty  Follow-up CBG: Time:1726 CBG Result:141  Possible Reasons for Event: Inadequate meal intake  Comments/MD notified:Yes    Omary, Aadya Kindler E

## 2018-04-02 NOTE — Progress Notes (Signed)
Patient ID: Travis Bryan, male   DOB: [birth year], 48 y.o.   MRN: [id number]     Advanced Heart Failure Rounding Note  PCP-Cardiologist: No primary care provider on file.   Subjective:    Minimal response to high dose Lasix on 1/20 so CVVH started.  Patient had apparent vagal event and passed out briefly (on bed pan).  Possible aspiration, was intubated and norepinephrine begun.  Norepinephrine dose down to 2. CVVH ongoing, pulling 150 cc/hr.   CXR: Bilateral lung consolidation.    Objective:   Weight Range: 110.8 kg Body mass index is 35.05 kg/m.   Vital Signs:   Temp:  [97.5 F (36.4 C)-99.9 F (37.7 C)] 97.5 F (36.4 C) (01/21 0400) Pulse Rate:  [60-109] 72 (01/21 0700) Resp:  [18-36] 25 (01/21 0700) BP: (81-155)/(50-96) 110/88 (01/21 0700) SpO2:  [86 %-100 %] 98 % (01/21 0700) Arterial Line BP: (91-137)/(50-85) 125/77 (01/21 0700) FiO2 (%):  [100 %] 100 % (01/21 0405) Weight:  [110.8 kg] 110.8 kg (01/21 0350) Last BM Date: 03/31/18  Weight change: Filed Weights   03/31/18 0447 04/01/18 0432 04/02/18 0350  Weight: 114.3 kg 113.6 kg 110.8 kg    Intake/Output:   Intake/Output Summary (Last 24 hours) at 04/02/2018 0735 Last data filed at 04/02/2018 0700 Gross per 24 hour  Intake 1615.31 ml  Output 3578 ml  Net -1962.69 ml      Physical Exam    General:  Intubated, awake.  HEENT: Normal Neck: Supple. JVP 12-14. Carotids 2+ bilat; no bruits. No lymphadenopathy or thyromegaly appreciated. Cor: PMI nondisplaced. Regular rate & rhythm. No rubs, gallops or murmurs. Lungs: Rhonchi bilaterally.  Abdomen: Soft, nontender, nondistended. No hepatosplenomegaly. No bruits or masses. Good bowel sounds. Extremities: No cyanosis, clubbing, rash, edema Neuro: Alert on vent, following commands.    Telemetry   NSR, personally reviewed   Labs    CBC Recent Labs    04/01/18 0551 04/01/18 1336  WBC 4.1 8.7  HGB 10.7* 12.5*  HCT 34.7* 42.4  MCV 94.3 96.8  PLT 109*  932*   Basic Metabolic Panel Recent Labs    04/01/18 0551  04/01/18 1659 04/02/18 0346  NA 134*   < > 137 136  K 3.5   < > 3.6 3.8  CL 98   < > 100 103  CO2 22   < > 22 22  GLUCOSE 112*   < > 149* 113*  BUN 70*   < > 66* 49*  CREATININE 8.98*   < > 8.71* 6.34*  CALCIUM 7.7*   < > 8.1* 8.4*  MG 1.8  --   --  2.7*  PHOS 4.1  --  4.2 2.7   < > = values in this interval not displayed.   Liver Function Tests Recent Labs    04/01/18 1336 04/01/18 1659 04/02/18 0346  AST 78*  --  72*  ALT 38  --  36  ALKPHOS 104  --  107  BILITOT 1.6*  --  2.0*  PROT 7.3  --  7.4  ALBUMIN 2.6* 2.3* 2.5*   No results for input(s): LIPASE, AMYLASE in the last 72 hours. Cardiac Enzymes Recent Labs    04/01/18 1336  TROPONINI 0.25*    BNP: BNP (last 3 results) Recent Labs    02/25/18 1235 03/28/18 1040  BNP 207.2* 683.2*    ProBNP (last 3 results) No results for input(s): PROBNP in the last 8760 hours.   D-Dimer No results for input(s): DDIMER in  the last 72 hours. Hemoglobin A1C No results for input(s): HGBA1C in the last 72 hours. Fasting Lipid Panel No results for input(s): CHOL, HDL, LDLCALC, TRIG, CHOLHDL, LDLDIRECT in the last 72 hours. Thyroid Function Tests No results for input(s): TSH, T4TOTAL, T3FREE, THYROIDAB in the last 72 hours.  Invalid input(s): FREET3  Other results:   Imaging    US Renal  Result Date: 04/02/2018 CLINICAL DATA:  Oliguria EXAM: RENAL / URINARY TRACT ULTRASOUND COMPLETE COMPARISON:  None. FINDINGS: Right Kidney: Renal measurements: 10.6 cm length by 5.3 cm height by 5.8 cm width = volume: 168.4 mL. Increased cortical echogenicity without hydronephrosis or mass. Left Kidney: Renal measurements: 13 cm length by 6 cm height by 5.9 cm wide = volume: 236.8 mL. Increased cortical echogenicity. No mass or hydronephrosis. Bladder: Bladder is empty IMPRESSION: 1. Echogenic kidneys bilaterally consistent with medical renal disease. No hydronephrosis.  Electronically Signed   By: Donavan Foil M.D.   On: 04/02/2018 01:17   Dg Chest Port 1 View  Result Date: 04/01/2018 CLINICAL DATA:  Patient status post intubation EXAM: PORTABLE CHEST 1 VIEW COMPARISON:  Chest radiograph 04/01/2018 FINDINGS: ET tube terminates in the mid trachea. Enteric tube courses inferior to the diaphragm. Pacer pads overlie the patient. Single lead AICD device overlies the left hemithorax, leads stable in position. Monitoring leads overlie the patient. Stable cardiomegaly. Persistent bilateral airspace disease, similar within the right lung and slightly improved within the left lung. No definite pneumothorax. IMPRESSION: ET tube terminates in the mid trachea. Slight interval improvement consolidation left lung with persistent diffuse consolidation right lung. Electronically Signed   By: Lovey Newcomer M.D.   On: 04/01/2018 16:28   Dg Chest Port 1 View  Result Date: 04/01/2018 CLINICAL DATA:  CHF, respiratory failure, pneumonia EXAM: PORTABLE CHEST 1 VIEW COMPARISON:  03/30/2018 FINDINGS: Cardiomegaly noted with AICD as before. Worsening perihilar and lower lobe airspace consolidation with central air bronchograms. Some improvement in the right upper lobe aeration. Appearance remains nonspecific but compatible with a combination of CHF with superimposed pneumonia. No enlarging effusion. No pneumothorax. Trachea is midline. IMPRESSION: Persistent bilateral airspace disease, slightly worse in the lower lobes but improved in the right upper lobe, suspect component of combined CHF/multilobar pneumonia. Electronically Signed   By: Jerilynn Mages.  Shick M.D.   On: 04/01/2018 10:43      Medications:     Scheduled Medications: . sodium chloride   Intravenous Once  . allopurinol  300 mg Oral Daily  . amiodarone  200 mg Oral Daily  . atorvastatin  40 mg Oral q1800  . chlorhexidine gluconate (MEDLINE KIT)  15 mL Mouth Rinse BID  . Chlorhexidine Gluconate Cloth  6 each Topical Daily  . magnesium  oxide  400 mg Oral Daily  . mouth rinse  15 mL Mouth Rinse 10 times per day  . sodium chloride flush  10-40 mL Intracatheter Q12H  . Warfarin - Pharmacist Dosing Inpatient   Does not apply q1800     Infusions: .  prismasol BGK 4/2.5 300 mL/hr at 04/01/18 1433  .  prismasol BGK 4/2.5 300 mL/hr at 04/01/18 1433  . cefTRIAXone (ROCEPHIN)  IV    . fentaNYL infusion INTRAVENOUS 375 mcg/hr (04/02/18 0700)  . heparin 999 mL/hr at 04/01/18 1433  . norepinephrine (LEVOPHED) Adult infusion 2 mcg/min (04/02/18 0700)  . prismasol BGK 4/2.5 2,000 mL/hr at 04/02/18 0622     PRN Medications:  acetaminophen **OR** acetaminophen, albuterol, docusate, fentaNYL, heparin, heparin, heparin, heparin, ondansetron (ZOFRAN) IV **  OR** ondansetron, senna-docusate, sodium chloride flush    Assessment/Plan   1. Acute hypoxemic respiratory failure: Suspect bilateral PNA. He grew metapneumovirus.  However, PCT up to 30 is suggestive of bacterial PNA.  He is now afebrile.  Intubated 1/20 after suspected aspiration event.  - Continue cefepime per CCM. 2. Acute on chronic systolic CHF: He has a nonischemic cardiomyopathy. St Jude ICD.  Echo (12/19) with EF 25-30%.  His creatinine rose up to 8.98 from baseline around 2.5-2.8. He was volume overloaded and did not respond to high dose diuretics so CVVH begun.  Also, now on norepinephrine 2 after intubation.  On exam, he remains volume overloaded.  Does not have CVP line.  - Suspect NE can be titrated off looking at SBP in 130s this morning.  - I will give Lasix 160 mg IV x 1 today to see if he responds.  - Continue CVVH per renal, now pulling 150 cc/hr.  - Hydralazine/Imdur and amlodipine on hold to keep BP up.  Will also need to hold Coreg while on NE.  - He has LBBB with QRS that has widened considerably.  May eventually be candidate for upgrade to CRT-D.  3. AKI: In setting of CKD stage IV.  Suspect cardiorenal syndrome => ATN.  BP has been stable, SBP in 110s-130s  currently. Creatinine rose up to 8.98 from baseline 2.5-2.8.  CVVH begun with intractable volume overload.  - CVVH for now, renal following.  4. Atrial fibrillation: Paroxysmal.  He remains in NSR on amiodarone.  - Continue warfarin.  5. H/o VT: On amiodarone.   Length of Stay: [place], MD  04/02/2018, 7:35 AM  Advanced Heart Failure Team Pager [phone] (M-F; 7a - 4p)  Please contact [facility] for night-coverage after hours (4p -7a ) and weekends on [email or web address]

## 2018-04-03 ENCOUNTER — Encounter (HOSPITAL_COMMUNITY): Payer: Self-pay | Admitting: *Deleted

## 2018-04-03 ENCOUNTER — Inpatient Hospital Stay (HOSPITAL_COMMUNITY): Payer: Medicaid Other

## 2018-04-03 LAB — BPAM FFP
Blood Product Expiration Date: 202001262359
Blood Product Expiration Date: 202001262359
ISSUE DATE / TIME: 202001210828
ISSUE DATE / TIME: 202001210930
Unit Type and Rh: 5100
Unit Type and Rh: 5100

## 2018-04-03 LAB — GLUCOSE, CAPILLARY
Glucose-Capillary: 141 mg/dL — ABNORMAL HIGH (ref 70–99)
Glucose-Capillary: 182 mg/dL — ABNORMAL HIGH (ref 70–99)
Glucose-Capillary: 188 mg/dL — ABNORMAL HIGH (ref 70–99)
Glucose-Capillary: 189 mg/dL — ABNORMAL HIGH (ref 70–99)
Glucose-Capillary: 196 mg/dL — ABNORMAL HIGH (ref 70–99)
Glucose-Capillary: 205 mg/dL — ABNORMAL HIGH (ref 70–99)

## 2018-04-03 LAB — PREPARE FRESH FROZEN PLASMA
Unit division: 0
Unit division: 0

## 2018-04-03 LAB — PROTIME-INR
INR: 4.78
INR: 2.57
PROTHROMBIN TIME: 44.1 s — AB (ref 11.4–15.2)
Prothrombin Time: 27.3 seconds — ABNORMAL HIGH (ref 11.4–15.2)

## 2018-04-03 LAB — POCT I-STAT 7, (LYTES, BLD GAS, ICA,H+H)
Acid-Base Excess: 1 mmol/L (ref 0.0–2.0)
Bicarbonate: 26.1 mmol/L (ref 20.0–28.0)
Calcium, Ion: 1.12 mmol/L — ABNORMAL LOW (ref 1.15–1.40)
HCT: 36 % — ABNORMAL LOW (ref 39.0–52.0)
Hemoglobin: 12.2 g/dL — ABNORMAL LOW (ref 13.0–17.0)
O2 Saturation: 96 %
Patient temperature: 98.6
Potassium: 3.8 mmol/L (ref 3.5–5.1)
Sodium: 136 mmol/L (ref 135–145)
TCO2: 27 mmol/L (ref 22–32)
pCO2 arterial: 41.7 mmHg (ref 32.0–48.0)
pH, Arterial: 7.404 (ref 7.350–7.450)
pO2, Arterial: 84 mmHg (ref 83.0–108.0)

## 2018-04-03 LAB — BASIC METABOLIC PANEL
Anion gap: 12 (ref 5–15)
BUN: 32 mg/dL — ABNORMAL HIGH (ref 6–20)
CO2: 21 mmol/L — AB (ref 22–32)
Calcium: 8.5 mg/dL — ABNORMAL LOW (ref 8.9–10.3)
Chloride: 101 mmol/L (ref 98–111)
Creatinine, Ser: 4.02 mg/dL — ABNORMAL HIGH (ref 0.61–1.24)
GFR calc Af Amer: 19 mL/min — ABNORMAL LOW (ref >60)
GFR calc non Af Amer: 17 mL/min — ABNORMAL LOW (ref >60)
Glucose, Bld: 197 mg/dL — ABNORMAL HIGH (ref 70–99)
Potassium: 3.8 mmol/L (ref 3.5–5.1)
Sodium: 134 mmol/L — ABNORMAL LOW (ref 135–145)

## 2018-04-03 LAB — CBC
HEMATOCRIT: 36.8 % — AB (ref 39.0–52.0)
Hemoglobin: 11.5 g/dL — ABNORMAL LOW (ref 13.0–17.0)
MCH: 29.2 pg (ref 26.0–34.0)
MCHC: 31.3 g/dL (ref 30.0–36.0)
MCV: 93.4 fL (ref 80.0–100.0)
Platelets: 171 10*3/uL (ref 150–400)
RBC: 3.94 MIL/uL — ABNORMAL LOW (ref 4.22–5.81)
RDW: 13.9 % (ref 11.5–15.5)
WBC: 5.9 10*3/uL (ref 4.0–10.5)
nRBC: 0 % (ref 0.0–0.2)

## 2018-04-03 LAB — COOXEMETRY PANEL
Carboxyhemoglobin: 1.4 % (ref 0.5–1.5)
Methemoglobin: 0.9 % (ref 0.0–1.5)
O2 Saturation: 50 %
Total hemoglobin: 12.2 g/dL (ref 12.0–16.0)

## 2018-04-03 LAB — RENAL FUNCTION PANEL
ANION GAP: 15 (ref 5–15)
Albumin: 3 g/dL — ABNORMAL LOW (ref 3.5–5.0)
BUN: 30 mg/dL — ABNORMAL HIGH (ref 6–20)
CO2: 23 mmol/L (ref 22–32)
Calcium: 8.7 mg/dL — ABNORMAL LOW (ref 8.9–10.3)
Chloride: 98 mmol/L (ref 98–111)
Creatinine, Ser: 3.74 mg/dL — ABNORMAL HIGH (ref 0.61–1.24)
GFR calc Af Amer: 21 mL/min — ABNORMAL LOW (ref >60)
GFR calc non Af Amer: 18 mL/min — ABNORMAL LOW (ref >60)
Glucose, Bld: 217 mg/dL — ABNORMAL HIGH (ref 70–99)
POTASSIUM: 3.7 mmol/L (ref 3.5–5.1)
Phosphorus: 3.6 mg/dL (ref 2.5–4.6)
Sodium: 136 mmol/L (ref 135–145)

## 2018-04-03 LAB — PHOSPHORUS: Phosphorus: 1.4 mg/dL — ABNORMAL LOW (ref 2.5–4.6)

## 2018-04-03 LAB — APTT: aPTT: 88 seconds — ABNORMAL HIGH (ref 24–36)

## 2018-04-03 LAB — MAGNESIUM: Magnesium: 2.7 mg/dL — ABNORMAL HIGH (ref 1.7–2.4)

## 2018-04-03 MED ORDER — SODIUM PHOSPHATES 45 MMOLE/15ML IV SOLN
30.0000 mmol | Freq: Once | INTRAVENOUS | Status: AC
  Administered 2018-04-03: 30 mmol via INTRAVENOUS
  Filled 2018-04-03 (×2): qty 10

## 2018-04-03 MED ORDER — SODIUM CHLORIDE 0.9% IV SOLUTION: Freq: Once | INTRAVENOUS | Status: AC

## 2018-04-03 MED ORDER — DIPHENHYDRAMINE HCL 50 MG/ML IJ SOLN: 12.5000 mg | Freq: Three times a day (TID) | INTRAMUSCULAR | Status: DC | PRN

## 2018-04-03 NOTE — Progress Notes (Signed)
CRITICAL VALUE ALERT  Critical Value:  INR 4.78  Date & Time Notied:  04/03/2018 @ 9022  Provider Notified: Dr Oletta Darter  Orders Received/Actions taken: new orders

## 2018-04-03 NOTE — Progress Notes (Addendum)
[facility] for Warfarin Indication: atrial fibrillation  Allergies  Allergen Reactions  . No Known Allergies    Patient Measurements: Height: 5\' 10"  (177.8 cm) Weight: 237 lb 7 oz (107.7 kg) IBW/kg (Calculated) : 73  Vital Signs: Temp: 98.6 F (37 C) (01/22 0755) Temp Source: Oral (01/22 0755) BP: 131/60 (01/22 0755) Pulse Rate: 81 (01/22 0755)  Labs: Recent Labs    04/01/18 1336  04/02/18 0346 04/02/18 0820 04/02/18 1600 04/03/18 0347  HGB 12.5*  --   --  11.9*  --  11.5*  HCT 42.4  --   --  39.8  --  36.8*  PLT 144*  --   --  131*  --  171  APTT  --   --  86*  --   --  88*  LABPROT 37.5*  --  43.3*  --   --  44.1*  INR 3.88  --  4.67*  --   --  4.78*  CREATININE 10.07*   < > 6.34*  --  4.87* 4.02*  TROPONINI 0.25*  --   --   --   --   --    < > = values in this interval not displayed.   Estimated Creatinine Clearance: 27.9 mL/min (A) (by C-G formula based on SCr of 4.02 mg/dL (H)).  Assessment: 65 yoM presenting with NV and fever, h/o afib on warfarin PTA. Pharmacy consulted to continue warfarin dosing upon admission. INR 4.67 yesterday (received FFP 2 units overnight) and 4.78 today (received FFP overnight 3 units)  Of note, patient did not receive ordered warfarin dose on 1/17. Hgb stable; PLT improved. No bleeding noted.   Noted interaction with amiodarone, resumed from PTA, and doxycycline which was discontinued 1/18.     PTA warfarin dose: 7.5mg  MWF, 5mg  TTSS (admit INR 3.03)   Of note, CRRT initiated 1/20   Goal of Therapy:  INR 2-3 Monitor platelets by anticoagulation protocol: Yes   Plan:  Continue to hold warfarin today Daily INR Monitor CBC trend and s/sx of bleeding  Carlyon Shadow, PharmD Candidate 2020 04/03/2018,8:08 AM

## 2018-04-03 NOTE — Progress Notes (Signed)
Patient ID: Travis Bryan, male   DOB: 1970/07/10, 48 y.o.   MRN: [id number] [facility] Progress Note   Assessment/ Plan:   1.  Acute kidney injury on chronic kidney disease stage IIIb/IV: Likely secondary to ATN versus cardiorenal mechanism; tolerated CRRT overnight with net -150 cc/h with -3.3 L overnight (so far -4.9 L).  With his current blood pressure and volume assessment, will decrease net fluid removal to 100 cc/h as he appears to be approaching euvolemia. 2.  Acute hypoxic respiratory failure: Remains on ventilator support, attempting volume unloading to improve success at extubation/weaning. 3.  Hyponatremia: Mild and secondary to acute kidney injury/free water excretion defect. Monitor with CRRT. 4.  History of atrial fibrillation/atrial flutter status post DCCV and ablation: Started on dobutamine overnight for component of cardiogenic shock as evident by low co-oximetry. 5.  Hypophosphatemia: Secondary to CRRT losses, will replace intravenously.  Subjective:   Started on dobutamine overnight based on Co-Ox, tolerating CRRT without problems.   Objective:   BP (!) 102/53   Pulse 69   Temp 98.6 F (37 C) (Oral) Comment: Simultaneous filing. User may not have seen previous data. Comment (Src): Simultaneous filing. User may not have seen previous data.  Resp (!) 28   Ht _0  (1.778 m)   Wt 107.7 kg   SpO2 95%   BMI 34.07 kg/m   Intake/Output Summary (Last 24 hours) at 04/03/2018 0739 Last data filed at 04/03/2018 0729 Gross per 24 hour  Intake 2299.32 ml  Output 5497 ml  Net -3197.68 ml   Weight change: -3.1 kg  Physical Exam: Gen: Intubated, awake.  Nods to questions. CVS: Pulse regular rhythm, normal rate, normal S1 and S2 Resp: Anteriorly clear to auscultation, no rales/rhonchi Abd: Firm, distended, mild epigastric tenderness, bowel sounds normal Ext: Trace lower extremity edema  Imaging: Dg Abd 1 View  Result Date: 04/02/2018 CLINICAL DATA:   Abdominal distension EXAM: ABDOMEN - 1 VIEW COMPARISON:  None. FINDINGS: Gastric catheter is noted within the distal stomach extending into the proximal duodenum. Right femoral venous catheter is seen. No obstructive changes are seen. No gaseous dilatation is noted. Bony structures are within normal limits. IMPRESSION: Tubes and lines as described above. No acute abnormality noted. Electronically Signed   By: Inez Catalina M.D.   On: 04/02/2018 16:22   US Renal  Result Date: 04/02/2018 CLINICAL DATA:  Oliguria EXAM: RENAL / URINARY TRACT ULTRASOUND COMPLETE COMPARISON:  None. FINDINGS: Right Kidney: Renal measurements: 10.6 cm length by 5.3 cm height by 5.8 cm width = volume: 168.4 mL. Increased cortical echogenicity without hydronephrosis or mass. Left Kidney: Renal measurements: 13 cm length by 6 cm height by 5.9 cm wide = volume: 236.8 mL. Increased cortical echogenicity. No mass or hydronephrosis. Bladder: Bladder is empty IMPRESSION: 1. Echogenic kidneys bilaterally consistent with medical renal disease. No hydronephrosis. Electronically Signed   By: Donavan Foil M.D.   On: 04/02/2018 01:17   Dg Chest Port 1 View  Result Date: 04/03/2018 CLINICAL DATA:  Respiratory failure. EXAM: PORTABLE CHEST 1 VIEW COMPARISON:  04/02/2018 FINDINGS: Endotracheal tube terminates at the clavicles, 4.5 cm above the carina. Enteric tube courses into the left upper abdomen with tip not imaged. An ICD remains in place. The cardiac silhouette remains enlarged. Extensive airspace opacities in both lungs, greatest in the bases, have not significantly changed. There may be small pleural effusions. No pneumothorax is identified. IMPRESSION: Unchanged extensive bilateral airspace disease which may reflect edema or pneumonia. Electronically Signed  By: Logan Bores M.D.   On: 04/03/2018 07:01   Dg Chest Port 1 View  Result Date: 04/02/2018 CLINICAL DATA:  ETT,HX DM,HTN,A-FLUTTER,CHF,FEVER,PNEUMONIA,CKD EXAM: PORTABLE CHEST -  1 VIEW COMPARISON:  the previous day's study FINDINGS: Endotracheal tube, gastric tube, left subclavian AICD stable in position. Diffuse interstitial and alveolar edema or infiltrates may be minimally improved. Stable cardiomegaly. Blunting of left lateral costophrenic angle suggesting effusion. Visualized bones unremarkable. IMPRESSION: 1. Slight improvement in bilateral edema/infiltrates. 2. Support hardware stable in position. Electronically Signed   By: Lucrezia Europe M.D.   On: 04/02/2018 08:00   Dg Chest Port 1 View  Result Date: 04/01/2018 CLINICAL DATA:  Patient status post intubation EXAM: PORTABLE CHEST 1 VIEW COMPARISON:  Chest radiograph 04/01/2018 FINDINGS: ET tube terminates in the mid trachea. Enteric tube courses inferior to the diaphragm. Pacer pads overlie the patient. Single lead AICD device overlies the left hemithorax, leads stable in position. Monitoring leads overlie the patient. Stable cardiomegaly. Persistent bilateral airspace disease, similar within the right lung and slightly improved within the left lung. No definite pneumothorax. IMPRESSION: ET tube terminates in the mid trachea. Slight interval improvement consolidation left lung with persistent diffuse consolidation right lung. Electronically Signed   By: Lovey Newcomer M.D.   On: 04/01/2018 16:28   Dg Chest Port 1 View  Result Date: 04/01/2018 CLINICAL DATA:  CHF, respiratory failure, pneumonia EXAM: PORTABLE CHEST 1 VIEW COMPARISON:  03/30/2018 FINDINGS: Cardiomegaly noted with AICD as before. Worsening perihilar and lower lobe airspace consolidation with central air bronchograms. Some improvement in the right upper lobe aeration. Appearance remains nonspecific but compatible with a combination of CHF with superimposed pneumonia. No enlarging effusion. No pneumothorax. Trachea is midline. IMPRESSION: Persistent bilateral airspace disease, slightly worse in the lower lobes but improved in the right upper lobe, suspect component of  combined CHF/multilobar pneumonia. Electronically Signed   By: Jerilynn Mages.  Shick M.D.   On: 04/01/2018 10:43    Labs: BMET Recent Labs  Lab 03/29/18 1627  03/31/18 1024 04/01/18 0551 04/01/18 1336 04/01/18 1659 04/02/18 0346 04/02/18 1600 04/03/18 0347  NA  --    < > 138 134* 137 137 136 136 134*  K  --    < > 3.5 3.5 3.5 3.6 3.8 3.7 3.8  CL  --    < > 102 98 98 100 103 102 101  CO2  --    < > 25 22 21* _0 21*  GLUCOSE  --    < > 165* 112* 164* 149* 113* 232* 197*  BUN  --    < > 58* 70* 76* 66* 49* 35* 32*  CREATININE  --    < > 6.52* 8.98* 10.07* 8.71* 6.34* 4.87* 4.02*  CALCIUM  --    < > 8.0* 7.7* 9.1 8.1* 8.4* 8.1* 8.5*  PHOS 2.5  --   --  4.1  --  4.2 2.7 2.6 1.4*   < > = values in this interval not displayed.   CBC Recent Labs  Lab 03/28/18 1040  04/01/18 0551 04/01/18 1336 04/02/18 0820 04/03/18 0347  WBC 5.8   < > 4.1 8.7 4.4 5.9  NEUTROABS 4.7  --   --   --   --   --   HGB 12.8*   < > 10.7* 12.5* 11.9* 11.5*  HCT 41.4   < > 34.7* 42.4 39.8 36.8*  MCV 93.7   < > 94.3 96.8 95.9 93.4  PLT 158   < >  109* 144* 131* 171   < > = values in this interval not displayed.    Medications:    . allopurinol  300 mg Oral Daily  . amiodarone  200 mg Oral Daily  . atorvastatin  40 mg Oral q1800  . chlorhexidine gluconate (MEDLINE KIT)  15 mL Mouth Rinse BID  . Chlorhexidine Gluconate Cloth  6 each Topical Daily  . feeding supplement (PRO-STAT SUGAR FREE 64)  30 mL Per Tube QID  . feeding supplement (VITAL HIGH PROTEIN)  1,000 mL Per Tube Q24H  . insulin aspart  0-9 Units Subcutaneous Q4H  . mouth rinse  15 mL Mouth Rinse 10 times per day  . pantoprazole sodium  40 mg Per Tube Daily  . senna-docusate  1 tablet Oral BID  . sodium chloride flush  10-40 mL Intracatheter Q12H  . Warfarin - Pharmacist Dosing Inpatient   Does not apply [id number]   Elmarie Shiley, MD 04/03/2018, 7:39 AM

## 2018-04-03 NOTE — Progress Notes (Signed)
RT note- ABG drawn prior to ventilator changes per Dr Norval [name].

## 2018-04-03 NOTE — Progress Notes (Addendum)
Patient ID: Travis Bryan, male   DOB: April 16, 1970, 48 y.o.   MRN: [id number]     Advanced Heart Failure Rounding Note  PCP-Cardiologist: No primary care provider on file.   Subjective:    Started CVVHD 1/2O . Intubated 1/20 .  Currently on dobutamine 4 mcg + Norepi 1 mcg. CO-OX 49% => BUT, drawn from femoral line.   Intubated. Follows commands.   Objective:   Weight Range: 107.7 kg Body mass index is 34.07 kg/m.   Vital Signs:   Temp:  [97.3 F (36.3 C)-99.6 F (37.6 C)] 98.6 F (37 C) (01/22 0729) Pulse Rate:  [39-86] 69 (01/22 0729) Resp:  [19-31] 28 (01/22 0729) BP: (76-134)/(53-109) 102/53 (01/22 0729) SpO2:  [86 %-100 %] 95 % (01/22 0729) Arterial Line BP: (83-134)/(44-70) 102/54 (01/22 0729) FiO2 (%):  [60 %-100 %] 70 % (01/22 0416) Weight:  [107.7 kg] 107.7 kg (01/22 0500) Last BM Date: 03/31/18  Weight change: Filed Weights   04/02/18 0350 04/03/18 0000 04/03/18 0500  Weight: 110.8 kg 107.7 kg 107.7 kg    Intake/Output:   Intake/Output Summary (Last 24 hours) at 04/03/2018 0755 Last data filed at 04/03/2018 0729 Gross per 24 hour  Intake 2299.32 ml  Output 5497 ml  Net -3197.68 ml      Physical Exam   General: Intubated  HEENT: ETT Neck: supple.JVP hard to assess Carotids 2+ bilat; no bruits. No lymphadenopathy or thryomegaly appreciated. Cor: PMI nondisplaced. Regular rate & rhythm. No rubs, gallops or murmurs. Lungs: rhonchi Abdomen: soft, nontender, nondistended. No hepatosplenomegaly. No bruits or masses. Good bowel sounds. Extremities: no cyanosis, clubbing, rash, edema Neuro: alert & orientedx3, cranial nerves grossly intact. moves all 4 extremities w/o difficulty. Affect pleasant    Telemetry   NSR  70-80s    Labs    CBC Recent Labs    04/02/18 0820 04/03/18 0347  WBC 4.4 5.9  HGB 11.9* 11.5*  HCT 39.8 36.8*  MCV 95.9 93.4  PLT 131* 578   Basic Metabolic Panel Recent Labs    04/02/18 0346 04/02/18 1600 04/03/18 0347    NA 136 136 134*  K 3.8 3.7 3.8  CL 103 102 101  CO2 22 22 21*  GLUCOSE 113* 232* 197*  BUN 49* 35* 32*  CREATININE 6.34* 4.87* 4.02*  CALCIUM 8.4* 8.1* 8.5*  MG 2.7*  --  2.7*  PHOS 2.7 2.6 1.4*   Liver Function Tests Recent Labs    04/01/18 1336  04/02/18 0346 04/02/18 1600  AST 78*  --  72*  --   ALT 38  --  36  --   ALKPHOS 104  --  107  --   BILITOT 1.6*  --  2.0*  --   PROT 7.3  --  7.4  --   ALBUMIN 2.6*   < > 2.5* 2.6*   < > = values in this interval not displayed.   No results for input(s): LIPASE, AMYLASE in the last 72 hours. Cardiac Enzymes Recent Labs    04/01/18 1336  TROPONINI 0.25*    BNP: BNP (last 3 results) Recent Labs    02/25/18 1235 03/28/18 1040  BNP 207.2* 683.2*    ProBNP (last 3 results) No results for input(s): PROBNP in the last 8760 hours.   D-Dimer No results for input(s): DDIMER in the last 72 hours. Hemoglobin A1C No results for input(s): HGBA1C in the last 72 hours. Fasting Lipid Panel No results for input(s): CHOL, HDL, LDLCALC, TRIG, CHOLHDL, LDLDIRECT in  the last 72 hours. Thyroid Function Tests No results for input(s): TSH, T4TOTAL, T3FREE, THYROIDAB in the last 72 hours.  Invalid input(s): FREET3  Other results:   Imaging    Dg Abd 1 View  Result Date: 04/02/2018 CLINICAL DATA:  Abdominal distension EXAM: ABDOMEN - 1 VIEW COMPARISON:  None. FINDINGS: Gastric catheter is noted within the distal stomach extending into the proximal duodenum. Right femoral venous catheter is seen. No obstructive changes are seen. No gaseous dilatation is noted. Bony structures are within normal limits. IMPRESSION: Tubes and lines as described above. No acute abnormality noted. Electronically Signed   By: Inez Catalina M.D.   On: 04/02/2018 16:22   Dg Chest Port 1 View  Result Date: 04/03/2018 CLINICAL DATA:  Respiratory failure. EXAM: PORTABLE CHEST 1 VIEW COMPARISON:  04/02/2018 FINDINGS: Endotracheal tube terminates at the  clavicles, 4.5 cm above the carina. Enteric tube courses into the left upper abdomen with tip not imaged. An ICD remains in place. The cardiac silhouette remains enlarged. Extensive airspace opacities in both lungs, greatest in the bases, have not significantly changed. There may be small pleural effusions. No pneumothorax is identified. IMPRESSION: Unchanged extensive bilateral airspace disease which may reflect edema or pneumonia. Electronically Signed   By: Logan Bores M.D.   On: 04/03/2018 07:01     Medications:     Scheduled Medications: . allopurinol  300 mg Oral Daily  . amiodarone  200 mg Oral Daily  . atorvastatin  40 mg Oral q1800  . chlorhexidine gluconate (MEDLINE KIT)  15 mL Mouth Rinse BID  . Chlorhexidine Gluconate Cloth  6 each Topical Daily  . feeding supplement (PRO-STAT SUGAR FREE 64)  30 mL Per Tube QID  . feeding supplement (VITAL HIGH PROTEIN)  1,000 mL Per Tube Q24H  . insulin aspart  0-9 Units Subcutaneous Q4H  . mouth rinse  15 mL Mouth Rinse 10 times per day  . pantoprazole sodium  40 mg Per Tube Daily  . senna-docusate  1 tablet Oral BID  . sodium chloride flush  10-40 mL Intracatheter Q12H  . Warfarin - Pharmacist Dosing Inpatient   Does not apply q1800    Infusions: .  prismasol BGK 4/2.5 300 mL/hr at 04/03/18 0015  .  prismasol BGK 4/2.5 300 mL/hr at 04/03/18 0744  . sodium chloride Stopped (04/02/18 1704)  . cefTRIAXone (ROCEPHIN)  IV 2 g (04/02/18 1003)  . DOBUTamine 4 mcg/kg/min (04/03/18 0700)  . heparin 999 mL/hr at 04/01/18 1433  . HYDROmorphone 0.5 mg/hr (04/03/18 0700)  . norepinephrine (LEVOPHED) Adult infusion 1 mcg/min (04/03/18 0745)  . prismasol BGK 4/2.5 2,000 mL/hr at 04/03/18 0504  . sodium phosphate  Dextrose 5% IVPB      PRN Medications: sodium chloride, acetaminophen **OR** acetaminophen, albuterol, fentaNYL, heparin, heparin, heparin, heparin, HYDROmorphone, hydrOXYzine, ondansetron (ZOFRAN) IV **OR** ondansetron, sodium chloride  flush    Assessment/Plan   1. Acute hypoxemic respiratory failure: Suspect bilateral PNA superimposed on viral PNA. He grew metapneumovirus.  However, PCT up to 30 is suggestive of bacterial PNA.  He is now afebrile.  Intubated 1/20 after suspected aspiration event.  - Remains on ceftriaxone 2. Acute on chronic systolic CHF: He has a nonischemic cardiomyopathy. [employer] ICD.  Echo (12/19) with EF 25-30%.  His creatinine rose up to 8.98 from baseline around 2.5-2.8. He was volume overloaded and did not respond to high dose diuretics so CVVH begun.   On Norepi 1 mcg + dobutamine 4 mcg. Maps stable. CO-OX 49%   -  On CVVH per renal, now pulling 150 cc/hr.  - Off Hydralazine/Imdur/amlodipine/coreg.   - He has LBBB with QRS that has widened considerably.  May eventually be candidate for upgrade to CRT-D.  3. AKI: In setting of CKD stage IV.  Suspect cardiorenal syndrome => ATN.   Started CVVH 1/20 renal following.  4. Atrial fibrillation: Paroxysmal.  He remains in NSR on amiodarone.  - Continue warfarin.  5. H/o VT: On amiodarone 200 mg daily.   Length of Stay: 6  Amy Clegg, NP  04/03/2018, 7:55 AM  Advanced Heart Failure Team Pager [phone] (M-F; 7a - 4p)  Please contact [facility] for night-coverage after hours (4p -7a ) and weekends on [email or web address]  Patient seen with NP, agree with the above note.   Overnight, he was started on dobutamine for co-ox 50%, but co-ox was drawn from femoral line so probably not accurate.  Today, BP is stable, weight down 7 lbs with UF 150 cc/hr via CVVH.  He remains in NSR.   He is awake on vent.  Decreased BS at bases. No edema, no JVD.   Agree with backing off on UF as he seems to be nearing euvolemia.  Per renal, decreased to 100 cc/hr.   I do not think co-ox is accurate off femoral line. Would decrease dobutamine to 2 then stop if remains clinically stable.  He is on low dose NE, can stop this if MAP remains stable.    No UOP recorded yesterday, I  am concerned that he may not have good renal recovery.   Loralie Champagne 04/03/2018 8:24 AM

## 2018-04-03 NOTE — Progress Notes (Signed)
[facility] Progress Note Patient Name: Travis Bryan DOB: 1970-07-15 MRN: [id number]   Date of Service  04/03/2018  HPI/Events of Note  Repeat COOX on Dobutamine IV infusion at 2 mcg/kg/min = 48.6.   eICU Interventions  Will order: 1. Increase Dobutamine IV infusion to 4 mcg/kg/min. 2. Repeat COOX at 5 AM.      Intervention Category Major Interventions: Shock - evaluation and management  Sommer,Steven Eugene 04/03/2018, 12:10 AM

## 2018-04-03 NOTE — Progress Notes (Signed)
[facility] Progress Note Patient Name: Travis Bryan DOB: 1970-03-30 MRN: [id number]   Date of Service  04/03/2018  HPI/Events of Note  Coagulopathy - INR = 4.78. Likely d/t Warfarin Rx, antibiotics and amiodarone.   eICU Interventions  Will order: 1. Transfuse 3 units FFP. 2. Repeat INR at 12 noon.      Intervention Category Intermediate Interventions: Coagulopathy - evaluation and management  Sommer,Steven Eugene 04/03/2018, 5:14 AM

## 2018-04-03 NOTE — Progress Notes (Addendum)
NAME:  Travis Bryan, MRN:  [id number], DOB:  03/26/1970, LOS: 6 ADMISSION DATE:  03/28/2018, CONSULTATION DATE:  03/28/2018 REFERRING MD:  Graciella Freer, CHIEF COMPLAINT:  Respiratory failure.   Brief History   48 year old man present with fever and nausea and vomiting. Increase in weight and dyspnea.Started on BiPAP and antibiotics for suspicion of pneumonia with superimposed decompensated heart failure. PEA arrest on 1/20. Intubated, ROSC achieved after 6 minutes of CPR and Epi x 2.   Past Medical History  Hypertension, hyperlipidemia, Type 2 Diabetes mellitus, Chronic combined heart failure (last echo 02/2018 with EF25-30%) with AICD, CKD stage III, paroxysmal atrial fibrillation on coumadin, Gout  Significant Hospital Events   1/17 admission to [facility]. PCCM consulted for persistent hypoxic respiratory failure despite 15 L per minute HFNC and he was transferred to the ICU.   1/20 PEA Arrest (6 minutes), intubated, started on CVVHD  Consults:  CCM  Heart failure  Nephrology   Procedures:  1/20 ETT 1/20 Fem HD Cath   Significant Diagnostic Tests:  None   Micro Data:  1/18 Resp cx - neg (final)  1/17 RVP - metapneumovirus   1/16 blood - negative  1/16 urine culture - neg  MRSA neg   Antimicrobials:  1/17 doxycycline > 1/18  1/17 vanc > 1/18   1/17 cefepime >>   Interim history/subjective:  Worsening hypotension after stopping levophed yesterday. Co-ox low, started on dobutamine overnight. Levophed stopped this morning but again became hypotensive.   On vent, opens eyes to voice / light touch but quickly falls back asleep.   Objective   Blood pressure 92/77, pulse (!) 39, temperature 99.4 F (37.4 C), temperature source Oral, resp. rate (!) 25, height 5\' 10"  (1.778 m), weight 107.7 kg, SpO2 94 %.    Vent Mode: PRVC FiO2 (%):  [60 %-100 %] 70 % Set Rate:  [28 bmp] 28 bmp Vt Set:  [580 mL] 580 mL PEEP:  [12 cmH20] 12 cmH20 Plateau Pressure:  [28 cmH20-34 cmH20] 28  cmH20   Intake/Output Summary (Last 24 hours) at 04/03/2018 4944 Last data filed at 04/03/2018 0700 Gross per 24 hour  Intake 2180.02 ml  Output 5497 ml  Net -3316.98 ml   Filed Weights   04/02/18 0350 04/03/18 0000 04/03/18 0500  Weight: 110.8 kg 107.7 kg 107.7 kg    Examination:  General: Laying in bed, NAD  HENT: ETT in place, NCAT Lungs: diffuse crackles and expiratory wheezing, bronchial breath sounds over the left lower lung fields  Cardiovascular: JVD+, regular rate and rhythm, no murmur  Abdomen: soft, non tender, non distended  Extremities: warm, no peripheral edema  Neuro: Sedated on vent, moving all extremities   Resolved Hospital Problem list     Assessment & Plan:   Acute hypoxic respiratory failure: Decompensated CHF exacerbation due to viral pneumonia. Covering empirically for superimposed bacterial pneumonia. Suffered PEA arrest on 1/20 after episode of emesis and likely aspiration. Unfortunately remains vent dependent requiring high PEEP and FiO2. Will continue attempts to wean, net -3.3L yesterday (-4.9 total) with CVVHD.  -- Full vent support -- Consider steroids for viral pneumonia if no improvement in the next 24-48 hours  -- Continue ceftriaxone (stop day 1/23)  -- Cardiology consulted, appreciate recommendations  -- Lasix per cards -- Continue CVVHD per nephro, decreasing net fluid removal 150 -> 100 / hr -- Dilaudid for pain, RASS goal   Hypotension / Shock: Requiring low dose levophed. Likely sedation related. Coox probably inaccurate given clinical  picture.  -- Stop dobutamine  -- Levophed PRN to maintain MAP > 65 -- Maintain A-line   Acute on chronic Anuric Renal Failure: Concern for cardiorenal syndrome vs ATN. Net negative almost 5L so far with CVVHD. Nephro decreasing net fluid removal rate 150 -> 100 /hr today.  -- Nephro consult -- CVVHD -- Lasix per cards -- Strict I&Os -- Maintain foley catheter  PEA Arrest 1/20 (6 minutes) Hx of NICM  with ICD  Hx of afib/ aflutter post DCCV and ablation 03/2016  Hx VT with ICD discharge 10/17 and 10/18  -- Continue home amiodarone, NSR this AM  -- INR supratherapeutic this morning, holding coumadin. -- holding metoprolol, hydral and imdur for soft BP   Type 2 Diabetes: With hypoglycemic episodes this admission. Tube feeds started yesterday. -- q4h SSI sensitive  -- Hold lantus  Best practice:  Diet: Tube feeds Pain/Anxiety/Delirium protocol (if indicated): Dilaudid drip VAP protocol (if indicated): Ordered  DVT prophylaxis: continue coumadin  GI prophylaxis: NA Glucose control: q4h SSI-s Mobility: Bedrest  Code Status: FULL Family Communication: None at bedside Disposition: Remain in ICU   Velna Ochs, M.D. - PGY3 Pager: [phone] 04/03/2018, 7:04 AM

## 2018-04-04 LAB — BPAM FFP
Blood Product Expiration Date: 202001272359
Blood Product Expiration Date: 202001272359
Blood Product Expiration Date: 202001272359
ISSUE DATE / TIME: 202001220601
ISSUE DATE / TIME: 202001220724
ISSUE DATE / TIME: 202001220906
UNIT TYPE AND RH: 5100
Unit Type and Rh: 5100
Unit Type and Rh: 5100

## 2018-04-04 LAB — BASIC METABOLIC PANEL
Anion gap: 12 (ref 5–15)
BUN: 29 mg/dL — ABNORMAL HIGH (ref 6–20)
CHLORIDE: 99 mmol/L (ref 98–111)
CO2: 22 mmol/L (ref 22–32)
Calcium: 8.9 mg/dL (ref 8.9–10.3)
Creatinine, Ser: 3.43 mg/dL — ABNORMAL HIGH (ref 0.61–1.24)
GFR calc non Af Amer: 20 mL/min — ABNORMAL LOW (ref >60)
GFR, EST AFRICAN AMERICAN: 23 mL/min — AB (ref >60)
Glucose, Bld: 231 mg/dL — ABNORMAL HIGH (ref 70–99)
Potassium: 3.7 mmol/L (ref 3.5–5.1)
Sodium: 133 mmol/L — ABNORMAL LOW (ref 135–145)

## 2018-04-04 LAB — RENAL FUNCTION PANEL
Albumin: 3.1 g/dL — ABNORMAL LOW (ref 3.5–5.0)
Anion gap: 15 (ref 5–15)
BUN: 30 mg/dL — AB (ref 6–20)
CO2: 21 mmol/L — ABNORMAL LOW (ref 22–32)
Calcium: 9.4 mg/dL (ref 8.9–10.3)
Chloride: 98 mmol/L (ref 98–111)
Creatinine, Ser: 3.36 mg/dL — ABNORMAL HIGH (ref 0.61–1.24)
GFR calc Af Amer: 24 mL/min — ABNORMAL LOW (ref >60)
GFR calc non Af Amer: 21 mL/min — ABNORMAL LOW (ref >60)
GLUCOSE: 272 mg/dL — AB (ref 70–99)
Phosphorus: 3.8 mg/dL (ref 2.5–4.6)
Potassium: 4.1 mmol/L (ref 3.5–5.1)
Sodium: 134 mmol/L — ABNORMAL LOW (ref 135–145)

## 2018-04-04 LAB — PREPARE FRESH FROZEN PLASMA
Unit division: 0
Unit division: 0
Unit division: 0

## 2018-04-04 LAB — PROTIME-INR
INR: 2.67
Prothrombin Time: 28 seconds — ABNORMAL HIGH (ref 11.4–15.2)

## 2018-04-04 LAB — GLUCOSE, CAPILLARY
Glucose-Capillary: 187 mg/dL — ABNORMAL HIGH (ref 70–99)
Glucose-Capillary: 197 mg/dL — ABNORMAL HIGH (ref 70–99)
Glucose-Capillary: 237 mg/dL — ABNORMAL HIGH (ref 70–99)
Glucose-Capillary: 182 mg/dL — ABNORMAL HIGH (ref 70–99)
Glucose-Capillary: 219 mg/dL — ABNORMAL HIGH (ref 70–99)
Glucose-Capillary: 227 mg/dL — ABNORMAL HIGH (ref 70–99)
Glucose-Capillary: 243 mg/dL — ABNORMAL HIGH (ref 70–99)

## 2018-04-04 LAB — PHOSPHORUS: PHOSPHORUS: 2.2 mg/dL — AB (ref 2.5–4.6)

## 2018-04-04 LAB — MAGNESIUM: Magnesium: 2.9 mg/dL — ABNORMAL HIGH (ref 1.7–2.4)

## 2018-04-04 LAB — APTT: aPTT: 84 seconds — ABNORMAL HIGH (ref 24–36)

## 2018-04-04 MED ORDER — SODIUM PHOSPHATES 45 MMOLE/15ML IV SOLN
30.0000 mmol | Freq: Once | INTRAVENOUS | Status: AC
  Administered 2018-04-04: 30 mmol via INTRAVENOUS
  Filled 2018-04-04: qty 10

## 2018-04-04 MED ORDER — INSULIN GLARGINE 100 UNIT/ML ~~LOC~~ SOLN
5.0000 [IU] | Freq: Every day | SUBCUTANEOUS | Status: DC
  Administered 2018-04-04: 5 [IU] via SUBCUTANEOUS
  Filled 2018-04-04 (×2): qty 0.05

## 2018-04-04 MED ORDER — POLYETHYLENE GLYCOL 3350 17 G PO PACK: 17.0000 g | PACK | Freq: Once | ORAL | Status: DC

## 2018-04-04 MED ORDER — INSULIN ASPART 100 UNIT/ML ~~LOC~~ SOLN
0.0000 [IU] | SUBCUTANEOUS | Status: DC
  Administered 2018-04-04 – 2018-04-05 (×4): 5 [IU] via SUBCUTANEOUS

## 2018-04-04 MED ORDER — ALBUMIN HUMAN 25 % IV SOLN
25.0000 g | Freq: Once | INTRAVENOUS | Status: AC
  Administered 2018-04-04: 25 g via INTRAVENOUS
  Filled 2018-04-04: qty 100

## 2018-04-04 MED ORDER — POLYETHYLENE GLYCOL 3350 17 G PO PACK
17.0000 g | PACK | Freq: Once | ORAL | Status: AC
  Administered 2018-04-04: 17 g via ORAL
  Filled 2018-04-04: qty 1

## 2018-04-04 NOTE — Progress Notes (Signed)
NAME:  Kendre Jacinto, MRN:  [id number], DOB:  02-26-1971, LOS: 7 ADMISSION DATE:  03/28/2018, CONSULTATION DATE:  03/28/2018 REFERRING MD:  Graciella Freer, CHIEF COMPLAINT:  Respiratory failure.   Brief History   48 year old man present with fever and nausea and vomiting. Increase in weight and dyspnea.Started on BiPAP and antibiotics for suspicion of pneumonia with superimposed decompensated heart failure. PEA arrest on 1/20. Intubated, ROSC achieved after 6 minutes of CPR and Epi x 2.   Past Medical History  Hypertension, hyperlipidemia, Type 2 Diabetes mellitus, Chronic combined heart failure (last echo 02/2018 with EF25-30%) with AICD, CKD stage III, paroxysmal atrial fibrillation on coumadin, Gout  Significant Hospital Events   1/17 admission to [facility]. PCCM consulted for persistent hypoxic respiratory failure despite 15 L per minute HFNC and he was transferred to the ICU.   1/20 PEA Arrest (6 minutes), intubated, started on CVVHD  Consults:  CCM  Heart failure  Nephrology   Procedures:  1/20 ETT 1/20 Fem HD Cath   Significant Diagnostic Tests:  None   Micro Data:  1/18 Resp cx - neg (final)  1/17 RVP - metapneumovirus   1/16 blood - negative  1/16 urine culture - neg  MRSA neg   Antimicrobials:  1/17 doxycycline > 1/18  1/17 vanc > 1/18  1/17 cefepime >> dc'd 1/20  1/21 Ceftriaxone >> 1/23  Interim history/subjective:  Alert of vent and dilaudid drip. Doing well on low dose levophed, FiO2 decreased to 60%. Reports itching is improved. No BM since 1/19.   Objective   Blood pressure 92/67, pulse 76, temperature 97.6 F (36.4 C), temperature source Oral, resp. rate (!) 24, height 5\' 10"  (1.778 m), weight 106.3 kg, SpO2 98 %.    Vent Mode: PRVC FiO2 (%):  [60 %-70 %] 60 % Set Rate:  [24 bmp-28 bmp] 24 bmp Vt Set:  [580 mL] 580 mL PEEP:  [12 cmH20-14 cmH20] 14 cmH20 Plateau Pressure:  [28 cmH20-38 cmH20] 31 cmH20   Intake/Output Summary (Last 24 hours) at 04/04/2018  0731 Last data filed at 04/04/2018 0700 Gross per 24 hour  Intake 3509.36 ml  Output 5228 ml  Net -1718.64 ml   Filed Weights   04/03/18 0000 04/03/18 0500 04/04/18 0411  Weight: 107.7 kg 107.7 kg 106.3 kg    Examination:  General: Laying in bed, NAD  HENT: ETT in place, NCAT Lungs: diffuse crackles and expiratory wheezing, bronchial breath sounds over the left lower lung fields  Cardiovascular: JVD+, regular rate and rhythm, no murmur  Abdomen: soft, non tender, non distended  Extremities: warm, no peripheral edema  Neuro: Sedated on vent, moving all extremities   Resolved Hospital Problem list     Assessment & Plan:   Acute hypoxic respiratory failure: Decompensated CHF exacerbation due to viral pneumonia. FiO2 down to 60% this morning, remains on high PEEP 18.  Will continue attempts to wean, net -1.5L yesterday (-6.6L total) with CVVHD.  -- Full vent support -- Hold off on steroids for now  -- Continue ceftriaxone (stop day today)  -- Cardiology consulted, appreciate recommendations  -- Lasix per cards -- Continue CVVHD per nephro, net -100 cc/hr -- Dilaudid for pain, RASS goal   Hypotension / Shock: Requiring low dose levophed. Likely sedation related.  -- Levophed PRN for goal MAP > 65 -- Maintain A-line   Acute on chronic Anuric Renal Failure: Concern for cardiorenal syndrome vs ATN. May need longer term HD access with [facility].  -- Nephro consult --  CVVHD -- Lasix per cards -- Strict I&Os -- Maintain foley catheter  PEA Arrest 1/20 (6 minutes) Hx of NICM with ICD  Hx of afib/ aflutter post DCCV and ablation 03/2016  Hx VT with ICD discharge 10/17 and 10/18  -- Continue home amiodarone, NSR this AM  -- Warfarin per pharmacy  -- holding metoprolol, hydral and imdur for soft BP   Type 2 Diabetes: With hypoglycemic episodes this admission. Tube feeds restarted, hyperglycemic today.  -- Add back low dose lantus 5 -- q4h SSI sensitive; will escalate to moderate if  still hyperglycemic this afternoon  Constipation: Last BM 1/19 -- Senokot-S BID -- Miralax per tube x 1 today   Best practice:  Diet: Tube feeds Pain/Anxiety/Delirium protocol (if indicated): Dilaudid drip VAP protocol (if indicated): Ordered  DVT prophylaxis: continue coumadin  GI prophylaxis: NA Glucose control: q4h SSI-s, Lantus  Mobility: Bedrest  Code Status: FULL Family Communication: None at bedside Disposition: Remain in ICU   Velna Ochs, M.D. - PGY3 Pager: [phone] 04/04/2018, 7:31 AM

## 2018-04-04 NOTE — Progress Notes (Addendum)
Patient ID: Travis Bryan, male   DOB: 05/17/1970, 47 y.o.   MRN: [id number]     Advanced Heart Failure Rounding Note  PCP-Cardiologist: No primary care provider on file.   Subjective:    Started CVVHD 1/20 . Intubated 1/20  Now on Norepi 3 mcg. Dobutamine stopped yesterday. MAPs 60-70s.   Remains intubated. FiO2 60%. Follows commands. Anuric. Pulling 100 mls/hr with CVVHD. Weight down 3 lbs.  Objective:   Weight Range: 106.3 kg Body mass index is 33.63 kg/m.   Vital Signs:   Temp:  [97.6 F (36.4 C)-99.4 F (37.4 C)] 98.5 F (36.9 C) (01/23 0742) Pulse Rate:  [34-89] 74 (01/23 0900) Resp:  [14-28] 18 (01/23 0900) BP: (70-120)/(55-94) 113/94 (01/23 0800) SpO2:  [65 %-100 %] 98 % (01/23 0900) Arterial Line BP: (86-139)/(48-79) 117/72 (01/23 0900) FiO2 (%):  [60 %-70 %] 60 % (01/23 0800) Weight:  [106.3 kg] 106.3 kg (01/23 0411) Last BM Date: 03/31/18  Weight change: Filed Weights   04/03/18 0000 04/03/18 0500 04/04/18 0411  Weight: 107.7 kg 107.7 kg 106.3 kg    Intake/Output:   Intake/Output Summary (Last 24 hours) at 04/04/2018 0954 Last data filed at 04/04/2018 0900 Gross per 24 hour  Intake 2651.44 ml  Output 4615 ml  Net -1963.56 ml      Physical Exam   General: Intubated/sedated.  HEENT: + ETT Neck: Supple. JVP difficult to assess. Carotids 2+ bilat; no bruits. No thyromegaly or nodule noted. Cor: PMI nondisplaced. RRR, No M/G/R noted Lungs: Diminished basilar sounds Abdomen: Soft, non-tender, non-distended, no HSM. No bruits or masses. +BS  Extremities: No cyanosis, clubbing, or rash. Trace ankle edema.  Neuro: Intubated/sedated   Telemetry   NSR 70-80s. Personally reviewed.   Labs    CBC Recent Labs    04/02/18 0820 04/03/18 0347 04/03/18 1036  WBC 4.4 5.9  --   HGB 11.9* 11.5* 12.2*  HCT 39.8 36.8* 36.0*  MCV 95.9 93.4  --   PLT 131* 171  --    Basic Metabolic Panel Recent Labs    04/03/18 0347  04/03/18 1524 04/04/18 0420    NA 134*   < > 136 133*  K 3.8   < > 3.7 3.7  CL 101  --  98 99  CO2 21*  --  23 22  GLUCOSE 197*  --  217* 231*  BUN 32*  --  30* 29*  CREATININE 4.02*  --  3.74* 3.43*  CALCIUM 8.5*  --  8.7* 8.9  MG 2.7*  --   --  2.9*  PHOS 1.4*  --  3.6 2.2*   < > = values in this interval not displayed.   Liver Function Tests Recent Labs    04/01/18 1336  04/02/18 0346 04/02/18 1600 04/03/18 1524  AST 78*  --  72*  --   --   ALT 38  --  36  --   --   ALKPHOS 104  --  107  --   --   BILITOT 1.6*  --  2.0*  --   --   PROT 7.3  --  7.4  --   --   ALBUMIN 2.6*   < > 2.5* 2.6* 3.0*   < > = values in this interval not displayed.   No results for input(s): LIPASE, AMYLASE in the last 72 hours. Cardiac Enzymes Recent Labs    04/01/18 1336  TROPONINI 0.25*    BNP: BNP (last 3 results) Recent Labs      02/25/18 1235 03/28/18 1040  BNP 207.2* 683.2*    ProBNP (last 3 results) No results for input(s): PROBNP in the last 8760 hours.   D-Dimer No results for input(s): DDIMER in the last 72 hours. Hemoglobin A1C No results for input(s): HGBA1C in the last 72 hours. Fasting Lipid Panel No results for input(s): CHOL, HDL, LDLCALC, TRIG, CHOLHDL, LDLDIRECT in the last 72 hours. Thyroid Function Tests No results for input(s): TSH, T4TOTAL, T3FREE, THYROIDAB in the last 72 hours.  Invalid input(s): FREET3  Other results:   Imaging    No results found.   Medications:     Scheduled Medications: . allopurinol  300 mg Oral Daily  . amiodarone  200 mg Oral Daily  . atorvastatin  40 mg Oral q1800  . chlorhexidine gluconate (MEDLINE KIT)  15 mL Mouth Rinse BID  . Chlorhexidine Gluconate Cloth  6 each Topical Daily  . feeding supplement (PRO-STAT SUGAR FREE 64)  30 mL Per Tube QID  . feeding supplement (VITAL HIGH PROTEIN)  1,000 mL Per Tube Q24H  . insulin aspart  0-9 Units Subcutaneous Q4H  . insulin glargine  5 Units Subcutaneous Daily  . mouth rinse  15 mL Mouth Rinse 10  times per day  . pantoprazole sodium  40 mg Per Tube Daily  . senna-docusate  1 tablet Oral BID  . sodium chloride flush  10-40 mL Intracatheter Q12H  . Warfarin - Pharmacist Dosing Inpatient   Does not apply q1800    Infusions: .  prismasol BGK 4/2.5 300 mL/hr at 04/03/18 1801  .  prismasol BGK 4/2.5 300 mL/hr at 04/03/18 1523  . sodium chloride Stopped (04/04/18 0823)  . DOBUTamine Stopped (04/03/18 0921)  . heparin 999 mL/hr at 04/04/18 0700  . HYDROmorphone 3 mg/hr (04/04/18 0900)  . norepinephrine (LEVOPHED) Adult infusion 3 mcg/min (04/04/18 0900)  . prismasol BGK 4/2.5 2,000 mL/hr at 04/04/18 0424  . sodium phosphate  Dextrose 5% IVPB 43 mL/hr at 04/04/18 0900    PRN Medications: sodium chloride, acetaminophen **OR** acetaminophen, albuterol, fentaNYL, heparin, heparin, heparin, heparin, HYDROmorphone, hydrOXYzine, ondansetron (ZOFRAN) IV **OR** ondansetron, sodium chloride flush    Assessment/Plan   1. Acute hypoxemic respiratory failure: Suspect bilateral PNA superimposed on viral PNA. He grew metapneumovirus.  However, PCT up to 30 is suggestive of bacterial PNA.  He is now afebrile.  Intubated 1/20 after suspected aspiration event.  - Remains on ceftriaxone. No change.  2. Acute on chronic systolic CHF: He has a nonischemic cardiomyopathy. St Jude ICD.  Echo (12/19) with EF 25-30%.  His creatinine rose up to 8.98 from baseline around 2.5-2.8. He was volume overloaded and did not respond to high dose diuretics so CVVH begun.   On Norepi 3 mcg. Maps 60-70s - On CVVH per renal, now pulling 100 cc/hr. Anuric - Off Hydralazine/Imdur/amlodipine/coreg.   - He has LBBB with QRS that has widened considerably.  May eventually be candidate for upgrade to CRT-D.  3. AKI: In setting of CKD stage IV.  Suspect cardiorenal syndrome => ATN.   Started CVVH 1/20 renal following. No change.  4. Atrial fibrillation: Paroxysmal. In NSR on amiodarone.  - Continue warfarin.  5. H/o VT: On  amiodarone 200 mg daily. No change.    Length of Stay: 7  Ashley M Smith, NP  04/04/2018, 9:54 AM  Advanced Heart Failure Team Pager [phone] (M-F; 7a - 4p)  Please contact [facility] for night-coverage after hours (4p -7a ) and weekends on [email or web address]  Patient   seen with NP, agree with the above note.   He remains on norepinephrine 3 with CVVH pulling 100 cc/hr per renal.  He looks near-euvolemic at this point.  He remains anuric. I am concerned that he may not have renal recovery.  - Wean NE as able  Treatment of PNA per primary service.     04/04/2018     

## 2018-04-04 NOTE — Procedures (Signed)
Arterial Catheter Insertion Procedure Note Isham Smitherman [id number] 09/17/1970  Procedure: Insertion of Arterial Catheter  Indications: Blood pressure monitoring  Procedure Details Consent: Risks of procedure as well as the alternatives and risks of each were explained to the (patient/caregiver).  Consent for procedure obtained. Time Out: Verified patient identification, verified procedure, site/side was marked, verified correct patient position, special equipment/implants available, medications/allergies/relevent history reviewed, required imaging and test results available.  Performed  Maximum sterile technique was used including antiseptics, gloves, gown, hand hygiene, mask and sheet. Skin prep: Chlorhexidine; local anesthetic administered 20 gauge catheter was inserted into right radial artery using the Seldinger technique.  Evaluation Blood flow good; BP tracing good. Complications: No apparent complications.   Velna Ochs 04/04/2018

## 2018-04-04 NOTE — Progress Notes (Signed)
Nutrition Follow-up  DOCUMENTATION CODES:   Obesity unspecified  INTERVENTION:    Continue Vital High Protein via OGT at 45 ml/h (1080 ml/day) with Prostat 30 ml QID to provide 1480 kcals, 155 gm protein, 903 ml free water daily  NUTRITION DIAGNOSIS:   Inadequate oral intake related to inability to eat as evidenced by NPO status.  Ongoing  GOAL:   Provide needs based on ASPEN/SCCM guidelines  Met with TF  MONITOR:   Vent status, TF tolerance, Labs, I & O's  ASSESSMENT:   48 yo male with PMH of CHF, NICM, HLD, HTN, obesity, AICD, DM-2, and A fib who was admitted with PNA with decompensated HF. S/P PEA arrest on 1/20, requiring CPR x 6 minutes and intubation.  Patient remains intubated on ventilator support; receiving CRRT MV: 13.4 L/min Temp (24hrs), Avg:98.5 F (36.9 C), Min:97.6 F (36.4 C), Max:99.4 F (37.4 C)   Patient is currently receiving Vital High Protein via OGT at 45 ml/h (1080 ml/day) with Prostat 30 ml QID to provide 1480 kcals, 155 gm protein, 903 ml free water daily. Tolerating well per RN.  Labs reviewed. Sodium 133 (L), phosphorus 2.2 (L), magnesium 2.9 (H) CBG's: [phone] Medications reviewed and include novolog, lantus, senokot-s, levophed.   Diet Order:   Diet Order            Diet NPO time specified  Diet effective now              EDUCATION NEEDS:   No education needs have been identified at this time  Skin:  Skin Assessment: Reviewed RN Assessment  Last BM:  1/19  Height:   Ht Readings from Last 1 Encounters:  03/28/18 _0  (1.778 m)    Weight:   Wt Readings from Last 1 Encounters:  04/04/18 106.3 kg    Ideal Body Weight:  75.5 kg  BMI:  Body mass index is 33.63 kg/m.  Estimated Nutritional Needs:   Kcal:  1220-1550  Protein:  151 gm  Fluid:  2 L    Molli Barrows, RD, LDN, [place] Pager [phone] After Hours Pager [phone]

## 2018-04-04 NOTE — Progress Notes (Signed)
Pt having difficulty maintaining BP while removing fluid via CRRT- dropped pt to keep even and notified Dr. Joelyn Oms w/Renal. Also notified Dr. Lynetta Mare and obtained a one-time order of albumin.

## 2018-04-04 NOTE — Progress Notes (Signed)
[place] for Warfarin Indication: atrial fibrillation  Allergies  Allergen Reactions  . No Known Allergies    Patient Measurements: Height: 5\' 10"  (177.8 cm) Weight: 234 lb 5.6 oz (106.3 kg) IBW/kg (Calculated) : 73  Vital Signs: Temp: 98.3 F (36.8 C) (01/23 1511) Temp Source: Oral (01/23 1511) BP: 91/62 (01/23 1600) Pulse Rate: 83 (01/23 1500)  Labs: Recent Labs    04/02/18 0346  04/02/18 0820  04/03/18 0347 04/03/18 1036 04/03/18 1111 04/03/18 1524 04/04/18 0420 04/04/18 0555  HGB  --    < > 11.9*  --  11.5* 12.2*  --   --   --   --   HCT  --   --  39.8  --  36.8* 36.0*  --   --   --   --   PLT  --   --  131*  --  171  --   --   --   --   --   APTT 86*  --   --   --  88*  --   --   --   --  84*  LABPROT 43.3*  --   --   --  44.1*  --  27.3*  --   --  28.0*  INR 4.67*  --   --   --  4.78*  --  2.57  --   --  2.67  CREATININE 6.34*  --   --    < > 4.02*  --   --  3.74* 3.43*  --    < > = values in this interval not displayed.   Estimated Creatinine Clearance: 32.5 mL/min (A) (by C-G formula based on SCr of 3.43 mg/dL (H)).  Assessment: 70 yoM presenting with NV and fever, h/o afib on warfarin PTA. Pharmacy consulted to continue warfarin dosing upon admission. Patient received FFP when his INR >4. INR therapeutic 1/23 but was held overnight due to CCM recommendation. Vitamin K given for HD catheter and plan to hold warfarin tonight due to procedure.  Of note, patient did not receive ordered warfarin dose on 1/17. H/H stable; PLT improved. No bleeding noted.   Noted interaction with amiodarone, resumed from PTA, and doxycycline which was discontinued 1/18.     PTA warfarin dose: 7.5mg  MWF, 5mg  TTSS (admit INR 3.03)   Of note, CRRT initiated 1/20   Goal of Therapy:  INR 2-3 Monitor platelets by anticoagulation protocol: Yes   Plan:  Hold warfarin tonight due to procedure but plan to restart tomorrow if INR appropriate.     Daily INR Monitor CBC trend and s/sx of bleeding  Carlyon Shadow, PharmD Candidate 2020 04/04/2018,4:15 PM

## 2018-04-04 NOTE — Progress Notes (Signed)
Patient ID: Travis Bryan, male   DOB: 06/08/1970, 48 y.o.   MRN: [id number] [facility] Progress Note   Assessment/ Plan:   1.  Acute kidney injury on chronic kidney disease stage IIIb/IV: Likely secondary to ATN versus cardiorenal mechanism; continues to tolerate CRRT with net -100 cc/h.  Clinically, appears to be approaching euvolemic state although chest x-ray shows evidence of bilateral patchy infiltrate versus edema. 2.  Acute hypoxic respiratory failure: Remains on ventilator support, attempting volume unloading to improve success at extubation/weaning. 3.  Hyponatremia: Mild and secondary to acute kidney injury/free water excretion defect. Monitor with CRRT. 4.  History of atrial fibrillation/atrial flutter status post DCCV and ablation: Overnight, weaned off of dobutamine. 5.  Hypophosphatemia: Secondary to CRRT losses, will replace intravenously.  Subjective:   Without acute events overnight, transiently required increased pressor when CRRT filter changed.   Objective:   BP 92/67   Pulse 76   Temp 97.6 F (36.4 C) (Oral)   Resp (!) 24   Ht '5\' 10"'  (1.778 m)   Wt 106.3 kg   SpO2 98%   BMI 33.63 kg/m   Intake/Output Summary (Last 24 hours) at 04/04/2018 0741 Last data filed at 04/04/2018 0700 Gross per 24 hour  Intake 3509.36 ml  Output 5228 ml  Net -1718.64 ml   Weight change: -1.4 kg  Physical Exam: Gen: Intubated, awake.  Nods to questions. CVS: Pulse regular rhythm, normal rate, normal S1 and S2 Resp: Anteriorly clear to auscultation, no rales/rhonchi Abd: Soft, distended, nontender, bowel sounds normal Ext: Trace/minimal lower extremity edema  Imaging: Dg Abd 1 View  Result Date: 04/02/2018 CLINICAL DATA:  Abdominal distension EXAM: ABDOMEN - 1 VIEW COMPARISON:  None. FINDINGS: Gastric catheter is noted within the distal stomach extending into the proximal duodenum. Right femoral venous catheter is seen. No obstructive changes are seen. No gaseous  dilatation is noted. Bony structures are within normal limits. IMPRESSION: Tubes and lines as described above. No acute abnormality noted. Electronically Signed   By: Inez Catalina M.D.   On: 04/02/2018 16:22   Dg Chest Port 1 View  Result Date: 04/03/2018 CLINICAL DATA:  Respiratory failure. EXAM: PORTABLE CHEST 1 VIEW COMPARISON:  04/02/2018 FINDINGS: Endotracheal tube terminates at the clavicles, 4.5 cm above the carina. Enteric tube courses into the left upper abdomen with tip not imaged. An ICD remains in place. The cardiac silhouette remains enlarged. Extensive airspace opacities in both lungs, greatest in the bases, have not significantly changed. There may be small pleural effusions. No pneumothorax is identified. IMPRESSION: Unchanged extensive bilateral airspace disease which may reflect edema or pneumonia. Electronically Signed   By: Logan Bores M.D.   On: 04/03/2018 07:01   Dg Chest Port 1 View  Result Date: 04/02/2018 CLINICAL DATA:  ETT,HX DM,HTN,A-FLUTTER,CHF,FEVER,PNEUMONIA,CKD EXAM: PORTABLE CHEST - 1 VIEW COMPARISON:  the previous day's study FINDINGS: Endotracheal tube, gastric tube, left subclavian AICD stable in position. Diffuse interstitial and alveolar edema or infiltrates may be minimally improved. Stable cardiomegaly. Blunting of left lateral costophrenic angle suggesting effusion. Visualized bones unremarkable. IMPRESSION: 1. Slight improvement in bilateral edema/infiltrates. 2. Support hardware stable in position. Electronically Signed   By: Lucrezia Europe M.D.   On: 04/02/2018 08:00    Labs: BMET Recent Labs  Lab 04/01/18 0551 04/01/18 1336 04/01/18 1659 04/02/18 0346 04/02/18 1600 04/03/18 0347 04/03/18 1036 04/03/18 1524 04/04/18 0420  NA 134* 137 137 136 136 134* 136 136 133*  K 3.5 3.5 3.6 3.8 3.7 3.8 3.8  3.7 3.7  CL 98 98 100 103 102 101  --  98 99  CO2 22 21* '22 22 22 ' 21*  --  23 22  GLUCOSE 112* 164* 149* 113* 232* 197*  --  217* 231*  BUN 70* 76* 66* 49*  35* 32*  --  30* 29*  CREATININE 8.98* 10.07* 8.71* 6.34* 4.87* 4.02*  --  3.74* 3.43*  CALCIUM 7.7* 9.1 8.1* 8.4* 8.1* 8.5*  --  8.7* 8.9  PHOS 4.1  --  4.2 2.7 2.6 1.4*  --  3.6 2.2*   CBC Recent Labs  Lab 03/28/18 1040  04/01/18 0551 04/01/18 1336 04/02/18 0820 04/03/18 0347 04/03/18 1036  WBC 5.8   < > 4.1 8.7 4.4 5.9  --   NEUTROABS 4.7  --   --   --   --   --   --   HGB 12.8*   < > 10.7* 12.5* 11.9* 11.5* 12.2*  HCT 41.4   < > 34.7* 42.4 39.8 36.8* 36.0*  MCV 93.7   < > 94.3 96.8 95.9 93.4  --   PLT 158   < > 109* 144* 131* 171  --    < > = values in this interval not displayed.    Medications:    . allopurinol  300 mg Oral Daily  . amiodarone  200 mg Oral Daily  . atorvastatin  40 mg Oral q1800  . chlorhexidine gluconate (MEDLINE KIT)  15 mL Mouth Rinse BID  . Chlorhexidine Gluconate Cloth  6 each Topical Daily  . feeding supplement (PRO-STAT SUGAR FREE 64)  30 mL Per Tube QID  . feeding supplement (VITAL HIGH PROTEIN)  1,000 mL Per Tube Q24H  . insulin aspart  0-9 Units Subcutaneous Q4H  . insulin glargine  5 Units Subcutaneous Daily  . mouth rinse  15 mL Mouth Rinse 10 times per day  . pantoprazole sodium  40 mg Per Tube Daily  . senna-docusate  1 tablet Oral BID  . sodium chloride flush  10-40 mL Intracatheter Q12H  . Warfarin - Pharmacist Dosing Inpatient   Does not apply [id number]   Elmarie Shiley, MD 04/04/2018, 7:41 AM

## 2018-04-04 NOTE — Progress Notes (Addendum)
[place] for Warfarin Indication: atrial fibrillation  Allergies  Allergen Reactions  . No Known Allergies    Patient Measurements: Height: 5\' 10"  (177.8 cm) Weight: 234 lb 5.6 oz (106.3 kg) IBW/kg (Calculated) : 73  Vital Signs: Temp: 98.5 F (36.9 C) (01/23 0742) Temp Source: Oral (01/23 0742) BP: 113/94 (01/23 0800) Pulse Rate: 84 (01/23 0800)  Labs: Recent Labs    04/01/18 1336  04/02/18 0346 04/02/18 0820  04/03/18 0347 04/03/18 1036 04/03/18 1111 04/03/18 1524 04/04/18 0420 04/04/18 0555  HGB 12.5*  --   --  11.9*  --  11.5* 12.2*  --   --   --   --   HCT 42.4  --   --  39.8  --  36.8* 36.0*  --   --   --   --   PLT 144*  --   --  131*  --  171  --   --   --   --   --   APTT  --   --  86*  --   --  88*  --   --   --   --  84*  LABPROT 37.5*  --  43.3*  --   --  44.1*  --  27.3*  --   --  28.0*  INR 3.88  --  4.67*  --   --  4.78*  --  2.57  --   --  2.67  CREATININE 10.07*   < > 6.34*  --    < > 4.02*  --   --  3.74* 3.43*  --   TROPONINI 0.25*  --   --   --   --   --   --   --   --   --   --    < > = values in this interval not displayed.   Estimated Creatinine Clearance: 32.5 mL/min (A) (by C-G formula based on SCr of 3.43 mg/dL (H)).  Assessment: 84 yoM presenting with NV and fever, h/o afib on warfarin PTA. Pharmacy consulted to continue warfarin dosing upon admission. Patient received FFP when his INR >4. INR therapeutic today. Per discussion with [facility], hold Warfarin for one more day. If INR remains therapeutic, resume dosing tomorrow.    Of note, patient did not receive ordered warfarin dose on 1/17. H/H stable; PLT improved. No bleeding noted.   Noted interaction with amiodarone, resumed from PTA, and doxycycline which was discontinued 1/18.     PTA warfarin dose: 7.5mg  MWF, 5mg  TTSS (admit INR 3.03)   Of note, CRRT initiated 1/20   Goal of Therapy:  INR 2-3 Monitor platelets by anticoagulation protocol:  Yes   Plan:  Hold Warfarin today   Daily INR Monitor CBC trend and s/sx of bleeding  Carlyon Shadow, PharmD Candidate 2020 04/04/2018,8:19 AM

## 2018-04-05 ENCOUNTER — Inpatient Hospital Stay (HOSPITAL_COMMUNITY): Payer: Medicaid Other

## 2018-04-05 LAB — APTT: aPTT: 73 s — ABNORMAL HIGH (ref 24–36)

## 2018-04-05 LAB — BASIC METABOLIC PANEL WITH GFR
Anion gap: 15 (ref 5–15)
BUN: 31 mg/dL — ABNORMAL HIGH (ref 6–20)
CO2: 20 mmol/L — ABNORMAL LOW (ref 22–32)
Calcium: 9.4 mg/dL (ref 8.9–10.3)
Chloride: 99 mmol/L (ref 98–111)
Creatinine, Ser: 3.34 mg/dL — ABNORMAL HIGH (ref 0.61–1.24)
GFR calc Af Amer: 24 mL/min — ABNORMAL LOW (ref >60)
GFR calc non Af Amer: 21 mL/min — ABNORMAL LOW (ref >60)
Glucose, Bld: 279 mg/dL — ABNORMAL HIGH (ref 70–99)
Potassium: 4.1 mmol/L (ref 3.5–5.1)
Sodium: 134 mmol/L — ABNORMAL LOW (ref 135–145)

## 2018-04-05 LAB — RENAL FUNCTION PANEL
Albumin: 3.1 g/dL — ABNORMAL LOW (ref 3.5–5.0)
Anion gap: 15 (ref 5–15)
BUN: 48 mg/dL — ABNORMAL HIGH (ref 6–20)
CHLORIDE: 98 mmol/L (ref 98–111)
CO2: 20 mmol/L — ABNORMAL LOW (ref 22–32)
Calcium: 9.6 mg/dL (ref 8.9–10.3)
Creatinine, Ser: 4.82 mg/dL — ABNORMAL HIGH (ref 0.61–1.24)
GFR calc Af Amer: 15 mL/min — ABNORMAL LOW (ref >60)
GFR calc non Af Amer: 13 mL/min — ABNORMAL LOW (ref >60)
Glucose, Bld: 384 mg/dL — ABNORMAL HIGH (ref 70–99)
Phosphorus: 2.1 mg/dL — ABNORMAL LOW (ref 2.5–4.6)
Potassium: 3.8 mmol/L (ref 3.5–5.1)
Sodium: 133 mmol/L — ABNORMAL LOW (ref 135–145)

## 2018-04-05 LAB — PROTIME-INR
INR: 2.72
INR: 2.03
PROTHROMBIN TIME: 28.4 s — AB (ref 11.4–15.2)
Prothrombin Time: 22.7 seconds — ABNORMAL HIGH (ref 11.4–15.2)

## 2018-04-05 LAB — GLUCOSE, CAPILLARY
Glucose-Capillary: 238 mg/dL — ABNORMAL HIGH (ref 70–99)
Glucose-Capillary: 272 mg/dL — ABNORMAL HIGH (ref 70–99)
Glucose-Capillary: 276 mg/dL — ABNORMAL HIGH (ref 70–99)
Glucose-Capillary: 340 mg/dL — ABNORMAL HIGH (ref 70–99)
Glucose-Capillary: 351 mg/dL — ABNORMAL HIGH (ref 70–99)

## 2018-04-05 LAB — PHOSPHORUS: Phosphorus: 2.5 mg/dL (ref 2.5–4.6)

## 2018-04-05 LAB — MAGNESIUM: Magnesium: 2.8 mg/dL — ABNORMAL HIGH (ref 1.7–2.4)

## 2018-04-05 MED ORDER — VITAMIN K1 10 MG/ML IJ SOLN
1.0000 mg | Freq: Once | INTRAVENOUS | Status: AC
  Administered 2018-04-05: 1 mg via INTRAVENOUS
  Filled 2018-04-05: qty 0.1

## 2018-04-05 MED ORDER — INSULIN ASPART 100 UNIT/ML ~~LOC~~ SOLN
4.0000 [IU] | SUBCUTANEOUS | Status: DC
  Administered 2018-04-05: 4 [IU] via SUBCUTANEOUS

## 2018-04-05 MED ORDER — INSULIN GLARGINE 100 UNIT/ML ~~LOC~~ SOLN
10.0000 [IU] | Freq: Every day | SUBCUTANEOUS | Status: DC
  Administered 2018-04-05: 10 [IU] via SUBCUTANEOUS
  Filled 2018-04-05 (×2): qty 0.1

## 2018-04-05 MED ORDER — INSULIN ASPART 100 UNIT/ML ~~LOC~~ SOLN
0.0000 [IU] | SUBCUTANEOUS | Status: DC
  Administered 2018-04-05: 15 [IU] via SUBCUTANEOUS
  Administered 2018-04-05 (×2): 11 [IU] via SUBCUTANEOUS
  Administered 2018-04-05 (×2): 20 [IU] via SUBCUTANEOUS
  Administered 2018-04-06: 15 [IU] via SUBCUTANEOUS
  Administered 2018-04-06: 11 [IU] via SUBCUTANEOUS
  Administered 2018-04-06 – 2018-04-07 (×5): 15 [IU] via SUBCUTANEOUS
  Administered 2018-04-07 (×2): 7 [IU] via SUBCUTANEOUS
  Administered 2018-04-07: 15 [IU] via SUBCUTANEOUS

## 2018-04-05 MED ORDER — LIP MEDEX EX OINT
TOPICAL_OINTMENT | CUTANEOUS | Status: DC | PRN
  Administered 2018-04-05 (×2): 1 via TOPICAL
  Filled 2018-04-05: qty 7

## 2018-04-05 MED ORDER — DIPHENHYDRAMINE HCL 50 MG/ML IJ SOLN
25.0000 mg | Freq: Four times a day (QID) | INTRAMUSCULAR | Status: DC | PRN
  Administered 2018-04-05 – 2018-04-25 (×12): 25 mg via INTRAVENOUS
  Filled 2018-04-05 (×15): qty 1

## 2018-04-05 MED ORDER — INSULIN ASPART 100 UNIT/ML ~~LOC~~ SOLN
4.0000 [IU] | SUBCUTANEOUS | Status: DC
  Administered 2018-04-05 – 2018-04-07 (×9): 4 [IU] via SUBCUTANEOUS

## 2018-04-05 MED ORDER — BLISTEX MEDICATED EX OINT
TOPICAL_OINTMENT | CUTANEOUS | Status: DC | PRN
  Administered 2018-04-05: 1 via TOPICAL
  Administered 2018-04-06: 21:00:00 via TOPICAL
  Filled 2018-04-05: qty 6.3

## 2018-04-05 MED ORDER — BISACODYL 10 MG RE SUPP
10.0000 mg | Freq: Every day | RECTAL | Status: DC | PRN
  Administered 2018-04-06: 10 mg via RECTAL
  Filled 2018-04-05: qty 1

## 2018-04-05 MED ORDER — NYSTATIN 100000 UNIT/ML MT SUSP
5.0000 mL | Freq: Four times a day (QID) | OROMUCOSAL | Status: DC
  Administered 2018-04-05 – 2018-04-26 (×74): 500000 [IU] via ORAL
  Filled 2018-04-05 (×77): qty 5

## 2018-04-05 NOTE — Progress Notes (Signed)
Patient ID: Travis Bryan, male   DOB: June 28, 1970, 48 y.o.   MRN: [id number] [name] Progress Note   Assessment/ Plan:   1.  Acute kidney injury on chronic kidney disease stage IIIb/IV: Likely secondary to ATN versus cardiorenal mechanism; he remains anuric and without any signs of renal recovery.  Ultrafiltration discontinued yesterday night because of hemodynamic instability and clinical findings of euvolemia to hypovolemia.  I will discontinue CRRT at this time and reevaluate with labs later today or tomorrow to decide on need to restart this therapy.  Plans noted for changing dialysis catheter site today. 2.  Acute hypoxic respiratory failure: Remains on ventilator support, volume status improved with ultrafiltration/CRRT. 3.  Hyponatremia: Mild and secondary to acute kidney injury/free water excretion defect.  Will monitor with consequent labs off of CRRT. 4.  History of atrial fibrillation/atrial flutter status post DCCV and ablation: Previously on dobutamine, controlled ventricular response. 5.  Hypophosphatemia: Secondary to CRRT losses, plans to stop CRRT at this time.  Subjective:   Problems with intermittent hypotension noted overnight, ultrafiltration discontinued   Objective:   BP (!) 77/59   Pulse 77   Temp 98.9 F (37.2 C) (Oral)   Resp (!) 24   Ht '5\' 10"'  (1.778 m)   Wt 103.8 kg   SpO2 98%   BMI 32.83 kg/m   Intake/Output Summary (Last 24 hours) at 04/05/2018 0738 Last data filed at 04/05/2018 0700 Gross per 24 hour  Intake 1504.76 ml  Output 2653 ml  Net -1148.24 ml   Weight change: -2.5 kg  Physical Exam: Gen: Intubated, awake.  Nods to questions. CVS: Pulse regular rhythm, normal rate, normal S1 and S2 Resp: Anteriorly clear to auscultation, no rales/rhonchi Abd: Soft, distended, nontender, bowel sounds normal Ext: Trace dependent edema, no lower extremity edema  Imaging: No results found.  Labs: BMET Recent Labs  Lab 04/02/18 0346  04/02/18 1600 04/03/18 0347 04/03/18 1036 04/03/18 1524 04/04/18 0420 04/04/18 1611 04/05/18 0354  NA 136 136 134* 136 136 133* 134* 134*  K 3.8 3.7 3.8 3.8 3.7 3.7 4.1 4.1  CL 103 102 101  --  98 99 98 99  CO2 22 22 21*  --  23 22 21* 20*  GLUCOSE 113* 232* 197*  --  217* 231* 272* 279*  BUN 49* 35* 32*  --  30* 29* 30* 31*  CREATININE 6.34* 4.87* 4.02*  --  3.74* 3.43* 3.36* 3.34*  CALCIUM 8.4* 8.1* 8.5*  --  8.7* 8.9 9.4 9.4  PHOS 2.7 2.6 1.4*  --  3.6 2.2* 3.8 2.5   CBC Recent Labs  Lab 04/01/18 0551 04/01/18 1336 04/02/18 0820 04/03/18 0347 04/03/18 1036  WBC 4.1 8.7 4.4 5.9  --   HGB 10.7* 12.5* 11.9* 11.5* 12.2*  HCT 34.7* 42.4 39.8 36.8* 36.0*  MCV 94.3 96.8 95.9 93.4  --   PLT 109* 144* 131* 171  --     Medications:    . allopurinol  300 mg Oral Daily  . amiodarone  200 mg Oral Daily  . atorvastatin  40 mg Oral q1800  . chlorhexidine gluconate (MEDLINE KIT)  15 mL Mouth Rinse BID  . Chlorhexidine Gluconate Cloth  6 each Topical Daily  . feeding supplement (PRO-STAT SUGAR FREE 64)  30 mL Per Tube QID  . feeding supplement (VITAL HIGH PROTEIN)  1,000 mL Per Tube Q24H  . insulin aspart  0-20 Units Subcutaneous Q4H  . insulin glargine  10 Units Subcutaneous Daily  .  mouth rinse  15 mL Mouth Rinse 10 times per day  . pantoprazole sodium  40 mg Per Tube Daily  . senna-docusate  1 tablet Oral BID  . sodium chloride flush  10-40 mL Intracatheter Q12H  . Warfarin - Pharmacist Dosing Inpatient   Does not apply [id number]   Elmarie Shiley, MD 04/05/2018, 7:38 AM

## 2018-04-05 NOTE — Progress Notes (Addendum)
NAME:  Travis Bryan, MRN:  [id number], DOB:  11-04-1970, LOS: 8 ADMISSION DATE:  03/28/2018, CONSULTATION DATE:  03/28/2018 REFERRING MD:  Graciella Freer, CHIEF COMPLAINT:  Respiratory failure.   Brief History   48 year old man present with fever and nausea and vomiting. Increase in weight and dyspnea.Started on BiPAP and antibiotics for suspicion of pneumonia with superimposed decompensated heart failure. PEA arrest on 1/20. Intubated, ROSC achieved after 6 minutes of CPR and Epi x 2.   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 PCCM consulted for persistent hypoxic respiratory failure despite 15 L per minute HFNC and he was transferred to the ICU.   1/20 PEA Arrest (6 minutes), intubated, started on CVVHD  Consults:  CCM  Heart failure  Nephrology   Procedures:  1/20 ETT 1/20 Fem HD Cath   Significant Diagnostic Tests:  None   Micro Data:  1/18 Resp cx - neg (final)  1/17 RVP - metapneumovirus   1/16 blood - negative  1/16 urine culture - neg  MRSA neg   Antimicrobials:  1/17 doxycycline > 1/18  1/17 vanc > 1/18  1/17 cefepime >> dc'd 1/20 1/21 Ceftriaxone >> 1/23  Interim history/subjective:  FiO2 down to 50%, PEEP 14. Alert on dilaudid drip. Nods to yes / no questions.   Objective   Blood pressure (!) 77/59, pulse 77, temperature 98.8 F (37.1 C), temperature source Oral, resp. rate (!) 24, height 5\' 10"  (1.778 m), weight 103.8 kg, SpO2 98 %.    Vent Mode: PRVC FiO2 (%):  [50 %-60 %] 50 % Set Rate:  [24 bmp] 24 bmp Vt Set:  [560 mL-580 mL] 560 mL PEEP:  [14 cmH20] 14 cmH20 Plateau Pressure:  [31 cmH20-34 cmH20] 32 cmH20   Intake/Output Summary (Last 24 hours) at 04/05/2018 0716 Last data filed at 04/05/2018 0700 Gross per 24 hour    Intake 1504.76 ml  Output 2653 ml  Net -1148.24 ml   Filed Weights   04/03/18 0500 04/04/18 0411 04/05/18 0242  Weight: 107.7 kg 106.3 kg 103.8 kg    Examination:  General: Laying in bed, NAD  HENT: ETT in place, NCAT Lungs: Improved rhonchi and wheezing in all lung fields, fine bibasilar crackles  Cardiovascular: regular rate and rhythm, no murmur  Abdomen: soft, non tender, non distended  Extremities: warm, no peripheral edema  Neuro: Alert on on vent, moving all extremities   Resolved Hospital Problem list     Assessment & Plan:   Acute hypoxic respiratory failure: Decompensated CHF exacerbation due to viral pneumonia. FiO2 down to 50% this and PEEP 14.  Will continue attempts to wean, net - 7.8L thus far with CVVHD.  -- Full vent support -- Completed antibiotics  -- Cardiology consulted, appreciate recommendations  -- Continue CVVHD per nephro, on hold for now. Plan to change out HD cath today and reassess after afternoon labs.  -- Dilaudid for pain, RASS goal 0 to -1  Hypotension / Shock: Requiring low dose levophed. Likely sedation related.  -- Levophed PRN for goal MAP > 65 -- Maintain A-line   Acute on chronic Anuric Renal Failure: Concern for cardiorenal syndrome / ATN. Will need removal of femoral HD cath today and placement of IJ. 1 mg IV Vit K for INR reversal.  -- Nephro consult -- CVVHD (  on hold)  -- Strict I&Os -- Maintain foley catheter  PEA Arrest 1/20 (6 minutes) Hx of NICM with ICD  Hx of afib/ aflutter post DCCV and ablation 03/2016  Hx VT with ICD discharge 10/17 and 10/18  -- Continue home amiodarone, NSR this AM  -- Holding warfarin  -- holding metoprolol, hydral and imdur for soft BP   Type 2 Diabetes: Uncontrolled -- Increased lantus 10 qd -- Increase SSI q4h to resistant   Constipation: Last BM 1/19 -- Senokot-S BID -- Miralax per tube x 1 yesterday -- Suppository if no BM today   Best practice:  Diet: Tube  feeds Pain/Anxiety/Delirium protocol (if indicated): Dilaudid drip VAP protocol (if indicated): Ordered  DVT prophylaxis: continue coumadin  GI prophylaxis: NA Glucose control: q4h SSI-r, Lantus  Mobility: Bedrest  Code Status: FULL Family Communication: None at bedside Disposition: Remain in ICU   Velna Ochs, M.D. - PGY3 Pager: [phone] 04/05/2018, 7:16 AM

## 2018-04-05 NOTE — Procedures (Signed)
Central Venous Hemodialysis Catheter Insertion Procedure Note Kamryn Gauthier [id number] 20-Nov-1970  Procedure: Insertion of HD Central Venous Catheter Indications: Dialysis  Procedure Details Consent: Risks of procedure as well as the alternatives and risks of each were explained to the (patient/caregiver).  Consent for procedure obtained. Time Out: Verified patient identification, verified procedure, site/side was marked, verified correct patient position, special equipment/implants available, medications/allergies/relevent history reviewed, required imaging and test results available.  Performed  Maximum sterile technique was used including antiseptics, cap, gloves, gown, hand hygiene, mask and sheet. Skin prep: Chlorhexidine; local anesthetic administered A antimicrobial bonded/coated triple lumen catheter was placed in the right internal jugular vein using the Seldinger technique.  Evaluation Blood flow good Complications: No apparent complications Patient did tolerate procedure well. Chest X-ray ordered to verify placement.  CXR: pending.  Jaevian Shean A Seraj Dunnam 04/05/2018, 4:59 PM

## 2018-04-05 NOTE — Progress Notes (Addendum)
Patient ID: Travis Bryan, male   DOB: [birth year], 48 y.o.   MRN: [id number]     Advanced Heart Failure Rounding Note  PCP-Cardiologist: No primary care provider on file.   Subjective:    Started CVVHD 1/20 . Intubated 1/20  Currently on dilaudid drip 1 mg per hour + Norepi 18 mcg. Planning to stop CVVHD today and change line.   Remains intubated. Opens eyes.    Objective:   Weight Range: 103.8 kg Body mass index is 32.83 kg/m.   Vital Signs:   Temp:  [97.7 F (36.5 C)-99.3 F (37.4 C)] 98.9 F (37.2 C) (01/24 0725) Pulse Rate:  [35-87] 84 (01/24 0801) Resp:  [13-25] 24 (01/24 0801) BP: (67-119)/(35-91) 77/59 (01/24 0801) SpO2:  [80 %-100 %] 95 % (01/24 0801) Arterial Line BP: (77-128)/(48-79) 128/71 (01/24 0758) FiO2 (%):  [40 %-50 %] 40 % (01/24 0801) Weight:  [103.8 kg] 103.8 kg (01/24 0242) Last BM Date: 03/31/18  Weight change: Filed Weights   04/03/18 0500 04/04/18 0411 04/05/18 0242  Weight: 107.7 kg 106.3 kg 103.8 kg    Intake/Output:   Intake/Output Summary (Last 24 hours) at 04/05/2018 0813 Last data filed at 04/05/2018 0800 Gross per 24 hour  Intake 1472.28 ml  Output 2627 ml  Net -1154.72 ml      Physical Exam   General:  Intubated. No resp difficulty HEENT: normal ETT  Neck: supple. no JVD. Carotids 2+ bilat; no bruits. No lymphadenopathy or thryomegaly appreciated. Cor: PMI nondisplaced. Regular rate & rhythm. No rubs, gallops or murmurs. Lungs: clear Abdomen: soft, nontender, distended. No hepatosplenomegaly. No bruits or masses. Good bowel sounds. Extremities: no cyanosis, clubbing, rash, edema. R femoral HD cath Neuro: intubated.    Telemetry   NSR 70s  Labs    CBC Recent Labs    04/02/18 0820 04/03/18 0347 04/03/18 1036  WBC 4.4 5.9  --   HGB 11.9* 11.5* 12.2*  HCT 39.8 36.8* 36.0*  MCV 95.9 93.4  --   PLT 131* 171  --    Basic Metabolic Panel Recent Labs    04/04/18 0420 04/04/18 1611 04/05/18 0354  NA 133* 134*  134*  K 3.7 4.1 4.1  CL 99 98 99  CO2 22 21* 20*  GLUCOSE 231* 272* 279*  BUN 29* 30* 31*  CREATININE 3.43* 3.36* 3.34*  CALCIUM 8.9 9.4 9.4  MG 2.9*  --  2.8*  PHOS 2.2* 3.8 2.5   Liver Function Tests Recent Labs    04/03/18 1524 04/04/18 1611  ALBUMIN 3.0* 3.1*   No results for input(s): LIPASE, AMYLASE in the last 72 hours. Cardiac Enzymes No results for input(s): CKTOTAL, CKMB, CKMBINDEX, TROPONINI in the last 72 hours.  BNP: BNP (last 3 results) Recent Labs    02/25/18 1235 03/28/18 1040  BNP 207.2* 683.2*    ProBNP (last 3 results) No results for input(s): PROBNP in the last 8760 hours.   D-Dimer No results for input(s): DDIMER in the last 72 hours. Hemoglobin A1C No results for input(s): HGBA1C in the last 72 hours. Fasting Lipid Panel No results for input(s): CHOL, HDL, LDLCALC, TRIG, CHOLHDL, LDLDIRECT in the last 72 hours. Thyroid Function Tests No results for input(s): TSH, T4TOTAL, T3FREE, THYROIDAB in the last 72 hours.  Invalid input(s): FREET3  Other results:   Imaging    No results found.   Medications:     Scheduled Medications: . allopurinol  300 mg Oral Daily  . amiodarone  200 mg Oral Daily  .  atorvastatin  40 mg Oral q1800  . chlorhexidine gluconate (MEDLINE KIT)  15 mL Mouth Rinse BID  . Chlorhexidine Gluconate Cloth  6 each Topical Daily  . feeding supplement (PRO-STAT SUGAR FREE 64)  30 mL Per Tube QID  . feeding supplement (VITAL HIGH PROTEIN)  1,000 mL Per Tube Q24H  . insulin aspart  0-20 Units Subcutaneous Q4H  . insulin glargine  10 Units Subcutaneous Daily  . mouth rinse  15 mL Mouth Rinse 10 times per day  . pantoprazole sodium  40 mg Per Tube Daily  . senna-docusate  1 tablet Oral BID  . sodium chloride flush  10-40 mL Intracatheter Q12H  . Warfarin - Pharmacist Dosing Inpatient   Does not apply q1800    Infusions: . sodium chloride 10 mL/hr at 04/05/18 0800  . DOBUTamine Stopped (04/03/18 6803)  . heparin  Stopped (04/04/18 0730)  . HYDROmorphone 1 mg/hr (04/05/18 0800)  . norepinephrine (LEVOPHED) Adult infusion 18 mcg/min (04/05/18 0800)  . phytonadione (VITAMIN K) IV      PRN Medications: sodium chloride, acetaminophen **OR** acetaminophen, albuterol, bisacodyl, fentaNYL, heparin, heparin, heparin, heparin, HYDROmorphone, hydrOXYzine, ondansetron (ZOFRAN) IV **OR** ondansetron, sodium chloride flush    Assessment/Plan   1. Acute hypoxemic respiratory failure: Suspect bilateral PNA superimposed on viral PNA. He grew metapneumovirus.  However, PCT up to 30 is suggestive of bacterial PNA.  He is now afebrile.  Intubated 1/20 after suspected aspiration event.  - Completed antibiotic course.   2. Acute on chronic systolic CHF: He has a nonischemic cardiomyopathy. [employer] Jude ICD.  Echo (12/19) with EF 25-30%.  His creatinine rose up to 8.98 from baseline around 2.5-2.8. He was volume overloaded and did not respond to high dose diuretics so CVVH begun. CVVH stopping today for line change.   - On Norepi 18 mcg. Plan to wean as tolerated.  - Off Hydralazine/Imdur/amlodipine/coreg.   - He has LBBB with QRS that has widened considerably.  May eventually be candidate for upgrade to CRT-D.  3. AKI: In setting of CKD stage IV.  Suspect cardiorenal syndrome => ATN.   Started CVVH 1/20 renal following.  - Stopping CVVH today for line change.    4. Atrial fibrillation: Paroxysmal. In NSR on amiodarone.  - Continue warfarin.  5. H/o VT: On amiodarone 200 mg daily. No change.    Length of Stay: [place], NP  04/05/2018, 8:13 AM  Advanced Heart Failure Team Pager [phone] (M-F; [place])  Please contact [facility] for night-coverage after hours (4p -7a ) and weekends on [email or web address]  Patient seen with NP, agree with the above note.   He remains intubated, on norepinephrine 18 and Dilaudid gtt.    He is anuric, no renal recovery so far.  I agree that he appears near-euvolemic to possibly a little  hypovolemic given pressor requirement.  CVVH to stop, will change line and likely need eventual iHD.    Wean norepinephrine for MAP 65 or above.    He has completed abx for PNA.    We will see again on Monday unless called.   Loralie Champagne 04/05/2018 8:52 AM

## 2018-04-06 LAB — RENAL FUNCTION PANEL
ANION GAP: 17 — AB (ref 5–15)
Albumin: 3 g/dL — ABNORMAL LOW (ref 3.5–5.0)
Albumin: 2.9 g/dL — ABNORMAL LOW (ref 3.5–5.0)
Anion gap: 20 — ABNORMAL HIGH (ref 5–15)
BUN: 72 mg/dL — ABNORMAL HIGH (ref 6–20)
BUN: 86 mg/dL — ABNORMAL HIGH (ref 6–20)
CALCIUM: 9.9 mg/dL (ref 8.9–10.3)
CO2: 21 mmol/L — ABNORMAL LOW (ref 22–32)
CO2: 23 mmol/L (ref 22–32)
CREATININE: 8.94 mg/dL — AB (ref 0.61–1.24)
Calcium: 9.8 mg/dL (ref 8.9–10.3)
Chloride: 97 mmol/L — ABNORMAL LOW (ref 98–111)
Chloride: 91 mmol/L — ABNORMAL LOW (ref 98–111)
Creatinine, Ser: 7.15 mg/dL — ABNORMAL HIGH (ref 0.61–1.24)
GFR calc Af Amer: 10 mL/min — ABNORMAL LOW (ref >60)
GFR calc Af Amer: 7 mL/min — ABNORMAL LOW (ref >60)
GFR calc non Af Amer: 8 mL/min — ABNORMAL LOW (ref >60)
GFR calc non Af Amer: 6 mL/min — ABNORMAL LOW (ref >60)
GLUCOSE: 305 mg/dL — AB (ref 70–99)
Glucose, Bld: 382 mg/dL — ABNORMAL HIGH (ref 70–99)
Phosphorus: 2 mg/dL — ABNORMAL LOW (ref 2.5–4.6)
Phosphorus: 5.3 mg/dL — ABNORMAL HIGH (ref 2.5–4.6)
Potassium: 3.8 mmol/L (ref 3.5–5.1)
Potassium: 3.9 mmol/L (ref 3.5–5.1)
Sodium: 135 mmol/L (ref 135–145)
Sodium: 134 mmol/L — ABNORMAL LOW (ref 135–145)

## 2018-04-06 LAB — CBC
HCT: 43.2 % (ref 39.0–52.0)
Hemoglobin: 13 g/dL (ref 13.0–17.0)
MCH: 28.3 pg (ref 26.0–34.0)
MCHC: 30.1 g/dL (ref 30.0–36.0)
MCV: 94.1 fL (ref 80.0–100.0)
Platelets: 387 10*3/uL (ref 150–400)
RBC: 4.59 MIL/uL (ref 4.22–5.81)
RDW: 14.1 % (ref 11.5–15.5)
WBC: 15.2 10*3/uL — ABNORMAL HIGH (ref 4.0–10.5)
nRBC: 0.9 % — ABNORMAL HIGH (ref 0.0–0.2)

## 2018-04-06 LAB — APTT: aPTT: 39 seconds — ABNORMAL HIGH (ref 24–36)

## 2018-04-06 LAB — GLUCOSE, CAPILLARY
Glucose-Capillary: 351 mg/dL — ABNORMAL HIGH (ref 70–99)
Glucose-Capillary: 314 mg/dL — ABNORMAL HIGH (ref 70–99)
Glucose-Capillary: 299 mg/dL — ABNORMAL HIGH (ref 70–99)
Glucose-Capillary: 326 mg/dL — ABNORMAL HIGH (ref 70–99)
Glucose-Capillary: 332 mg/dL — ABNORMAL HIGH (ref 70–99)
Glucose-Capillary: 350 mg/dL — ABNORMAL HIGH (ref 70–99)

## 2018-04-06 LAB — MAGNESIUM: Magnesium: 3.4 mg/dL — ABNORMAL HIGH (ref 1.7–2.4)

## 2018-04-06 LAB — TRIGLYCERIDES: Triglycerides: 288 mg/dL — ABNORMAL HIGH (ref <150)

## 2018-04-06 LAB — PROTIME-INR
INR: 1.66
Prothrombin Time: 19.4 seconds — ABNORMAL HIGH (ref 11.4–15.2)

## 2018-04-06 MED ORDER — WARFARIN SODIUM 7.5 MG PO TABS
7.5000 mg | ORAL_TABLET | Freq: Once | ORAL | Status: AC
  Administered 2018-04-06: 7.5 mg
  Filled 2018-04-06: qty 1

## 2018-04-06 MED ORDER — AMIODARONE HCL 200 MG PO TABS
200.0000 mg | ORAL_TABLET | Freq: Every day | ORAL | Status: DC
  Administered 2018-04-06 – 2018-04-22 (×14): 200 mg
  Filled 2018-04-06 (×16): qty 1

## 2018-04-06 MED ORDER — ACETAMINOPHEN 650 MG RE SUPP: 650.0000 mg | Freq: Four times a day (QID) | RECTAL | Status: DC | PRN

## 2018-04-06 MED ORDER — WARFARIN SODIUM 7.5 MG PO TABS: 7.5000 mg | ORAL_TABLET | Freq: Once | ORAL | Status: DC

## 2018-04-06 MED ORDER — PROPOFOL 1000 MG/100ML IV EMUL
0.0000 ug/kg/min | INTRAVENOUS | Status: DC
  Administered 2018-04-06: 5 ug/kg/min via INTRAVENOUS
  Administered 2018-04-06: 10 ug/kg/min via INTRAVENOUS
  Administered 2018-04-07: 40 ug/kg/min via INTRAVENOUS
  Administered 2018-04-07: 30 ug/kg/min via INTRAVENOUS
  Administered 2018-04-07 – 2018-04-08 (×2): 15 ug/kg/min via INTRAVENOUS
  Administered 2018-04-08: 20 ug/kg/min via INTRAVENOUS
  Administered 2018-04-09: 10 ug/kg/min via INTRAVENOUS
  Filled 2018-04-06 (×9): qty 100

## 2018-04-06 MED ORDER — ACETAMINOPHEN 325 MG PO TABS
650.0000 mg | ORAL_TABLET | Freq: Four times a day (QID) | ORAL | Status: DC | PRN
  Administered 2018-04-22 – 2018-04-26 (×8): 650 mg
  Filled 2018-04-06 (×10): qty 2

## 2018-04-06 MED ORDER — INSULIN GLARGINE 100 UNIT/ML ~~LOC~~ SOLN
20.0000 [IU] | Freq: Every day | SUBCUTANEOUS | Status: DC
  Administered 2018-04-06: 20 [IU] via SUBCUTANEOUS
  Filled 2018-04-06 (×2): qty 0.2

## 2018-04-06 MED ORDER — MIDAZOLAM HCL 2 MG/2ML IJ SOLN
2.0000 mg | INTRAMUSCULAR | Status: DC | PRN
  Administered 2018-04-09 (×4): 2 mg via INTRAVENOUS
  Filled 2018-04-06 (×4): qty 2

## 2018-04-06 MED ORDER — SODIUM PHOSPHATES 45 MMOLE/15ML IV SOLN
10.0000 mmol | Freq: Once | INTRAVENOUS | Status: AC
  Administered 2018-04-06: 10 mmol via INTRAVENOUS
  Filled 2018-04-06: qty 3.33

## 2018-04-06 MED ORDER — ATORVASTATIN CALCIUM 40 MG PO TABS
40.0000 mg | ORAL_TABLET | Freq: Every day | ORAL | Status: DC
  Administered 2018-04-06 – 2018-04-21 (×12): 40 mg
  Filled 2018-04-06 (×16): qty 1

## 2018-04-06 MED ORDER — ALLOPURINOL 300 MG PO TABS
300.0000 mg | ORAL_TABLET | Freq: Every day | ORAL | Status: DC
  Administered 2018-04-06 – 2018-04-19 (×11): 300 mg
  Filled 2018-04-06 (×13): qty 1

## 2018-04-06 NOTE — Progress Notes (Signed)
Patient ID: Travis Bryan, male   DOB: 23-Apr-1970, 48 y.o.   MRN: [id number] [facility] Progress Note   Assessment/ Plan:   1.  Acute kidney injury on chronic kidney disease stage IIIb/IV: (baseline creatinine 2.5-2.8) likely secondary to ATN versus cardiorenal mechanism; he remains anuric and without any signs of renal recovery-unclear whether/what extent of renal recovery he will have at this point.  CRRT discontinued yesterday and will keep on hold for today as he does not have any indications based on labs/volume status; if continues to have a similar trajectory of creatinine/BUN rise-this will most likely prompt restarting tomorrow. 2.  Acute hypoxic respiratory failure: Remains on ventilator support, volume status improved with ultrafiltration/CRRT. 3.  Hyponatremia: Mild and secondary to acute kidney injury/free water excretion defect.  Improving slowly. 4.  History of atrial fibrillation/atrial flutter status post DCCV and ablation: Previously on dobutamine, controlled ventricular response. 5.  Hypophosphatemia: Secondary to previous CRRT losses with limited intake, will give sodium phosphate.  Subjective:   Without acute events overnight, remains on pressors.   Objective:   BP 93/71   Pulse 72   Temp 98.3 F (36.8 C) (Axillary)   Resp (!) 24   Ht _0  (1.778 m)   Wt 103.6 kg   SpO2 91%   BMI 32.77 kg/m   Intake/Output Summary (Last 24 hours) at 04/06/2018 0737 Last data filed at 04/06/2018 0600 Gross per 24 hour  Intake 2295.45 ml  Output 62 ml  Net 2233.45 ml   Weight change: -0.2 kg  Physical Exam: Gen: Intubated, awakens to calling out his name. CVS: Pulse regular rhythm, normal rate, normal S1 and S2 Resp: Anteriorly clear to auscultation, no rales/rhonchi Abd: Soft, distended, nontender, bowel sounds normal Ext: Trace dependent edema, no lower extremity edema  Imaging: Dg Chest Port 1 View  Result Date: 04/05/2018 CLINICAL DATA:  Hemodialysis  catheter dysfunction. EXAM: PORTABLE CHEST 1 VIEW COMPARISON:  April 03, 2018 FINDINGS: A new right hemodialysis catheter terminates in the central SVC. No pneumothorax. Cardiomegaly. Hila and mediastinum are unchanged. Focal opacity in the right base appears mildly improved. No overt edema. IMPRESSION: 1. New right central line without pneumothorax. The central line terminates in the central SVC. 2. Opacity in the medial right lung base may represent atelectasis or infiltrate/pneumonia. Recommend clinical correlation and follow-up to resolution. 3. The ETT is in good position. The NG tube terminates below today's film. Electronically Signed   By: Dorise Bullion III M.D   On: 04/05/2018 17:13    Labs: BMET Recent Labs  Lab 04/03/18 9450 04/03/18 1036 04/03/18 1524 04/04/18 0420 04/04/18 1611 04/05/18 0354 04/05/18 1502 04/06/18 0416  NA 134* 136 136 133* 134* 134* 133* 135  K 3.8 3.8 3.7 3.7 4.1 4.1 3.8 3.8  CL 101  --  98 99 98 99 98 97*  CO2 21*  --  23 22 21* 20* 20* 21*  GLUCOSE 197*  --  217* 231* 272* 279* 384* 305*  BUN 32*  --  30* 29* 30* 31* 48* 72*  CREATININE 4.02*  --  3.74* 3.43* 3.36* 3.34* 4.82* 7.15*  CALCIUM 8.5*  --  8.7* 8.9 9.4 9.4 9.6 9.8  PHOS 1.4*  --  3.6 2.2* 3.8 2.5 2.1* 2.0*   CBC Recent Labs  Lab 04/01/18 1336 04/02/18 0820 04/03/18 0347 04/03/18 1036 04/06/18 0416  WBC 8.7 4.4 5.9  --  15.2*  HGB 12.5* 11.9* 11.5* 12.2* 13.0  HCT 42.4 39.8 36.8* 36.0* 43.2  MCV 96.8 95.9 93.4  --  94.1  PLT 144* 131* 171  --  387    Medications:    . allopurinol  300 mg Oral Daily  . amiodarone  200 mg Oral Daily  . atorvastatin  40 mg Oral q1800  . chlorhexidine gluconate (MEDLINE KIT)  15 mL Mouth Rinse BID  . Chlorhexidine Gluconate Cloth  6 each Topical Daily  . feeding supplement (PRO-STAT SUGAR FREE 64)  30 mL Per Tube QID  . feeding supplement (VITAL HIGH PROTEIN)  1,000 mL Per Tube Q24H  . insulin aspart  0-20 Units Subcutaneous Q4H  . insulin  aspart  4 Units Subcutaneous Q4H  . insulin glargine  10 Units Subcutaneous Daily  . mouth rinse  15 mL Mouth Rinse 10 times per day  . nystatin  5 mL Oral QID  . pantoprazole sodium  40 mg Per Tube Daily  . senna-docusate  1 tablet Oral BID  . sodium chloride flush  10-40 mL Intracatheter Q12H  . Warfarin - Pharmacist Dosing Inpatient   Does not apply [id number]   Elmarie Shiley, MD 04/06/2018, 7:37 AM

## 2018-04-06 NOTE — Progress Notes (Signed)
[facility] for Warfarin Indication: atrial fibrillation  Patient Measurements: Height: 5\' 10"  (177.8 cm) Weight: 228 lb 6.3 oz (103.6 kg) IBW/kg (Calculated) : 73  Vital Signs: Temp: 98.7 F (37.1 C) (01/25 0746) Temp Source: Axillary (01/25 0746) BP: 100/76 (01/25 0800) Pulse Rate: 80 (01/25 0800)  Labs: Recent Labs    04/04/18 0555  04/05/18 0354 04/05/18 1351 04/05/18 1502 04/06/18 0416  HGB  --   --   --   --   --  13.0  HCT  --   --   --   --   --  43.2  PLT  --   --   --   --   --  387  APTT 84*  --  73*  --   --  39*  LABPROT 28.0*  --  28.4* 22.7*  --  19.4*  INR 2.67  --  2.72 2.03  --  1.66  CREATININE  --    < > 3.34*  --  4.82* 7.15*   < > = values in this interval not displayed.    Assessment: 48 year old male presenting with NV and fever, h/o afib on warfarin PTA. Pharmacy consulted to continue warfarin dosing upon admission.   Patient had a supratherapeutic INR on 1/21 - he received FFP in an attempt to lower the INR. On 1/24, the INR was still > 2 , he received 1 mg of Vitamin K in anticipation of needing to place a dialysis catheter. Today is INR is 1.6.  PTA warfarin dose: 7.5 mg MWF, 5 mg TTSS   Goal of Therapy:  INR 2-3 Monitor platelets by anticoagulation protocol: Yes   Plan:  Warfarin 7.5 mg po x1   Daily INR   Harvel Quale 04/06/2018 10:41 AM

## 2018-04-06 NOTE — Progress Notes (Signed)
NAME:  Travis Bryan, MRN:  [id number], DOB:  1970/07/09, LOS: 9 ADMISSION DATE:  03/28/2018, CONSULTATION DATE:  03/28/2018 REFERRING MD:  Graciella Freer, CHIEF COMPLAINT:  Respiratory failure.   Brief History   48 year old man present with fever and nausea and vomiting. Increase in weight and dyspnea.Started on BiPAP and antibiotics for suspicion of pneumonia with superimposed decompensated heart failure. PEA arrest on 1/20. Intubated, ROSC achieved after 6 minutes of CPR and Epi x 2.   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 PCCM consulted for persistent hypoxic respiratory failure despite 15 L per minute HFNC and he was transferred to the ICU.   1/20 PEA Arrest (6 minutes), intubated, started on CVVHD  Consults:  CCM  Heart failure  Nephrology   Procedures:  1/20 ETT 1/20 Fem HD Cath   Significant Diagnostic Tests:  None   Micro Data:  1/18 Resp cx - neg (final)  1/17 RVP - metapneumovirus   1/16 blood - negative  1/16 urine culture - neg  MRSA neg   Antimicrobials:  1/17 doxycycline > 1/18  1/17 vanc > 1/18  1/17 cefepime >> dc'd 1/20 1/21 Ceftriaxone >> 1/23  Interim history/subjective:  Remains on vent, pressors, CRRT.  Objective   Blood pressure 100/76, pulse 80, temperature 98.7 F (37.1 C), temperature source Axillary, resp. rate 18, height 5\' 10"  (1.778 m), weight 103.6 kg, SpO2 93 %.    Vent Mode: PRVC FiO2 (%):  [40 %-50 %] 50 % Set Rate:  [18 bmp-24 bmp] 18 bmp Vt Set:  [560 mL-580 mL] 580 mL PEEP:  [10 cmH20-12 cmH20] 12 cmH20 Delta P (Amplitude):  [35] 35 Plateau Pressure:  [25 cmH20-32 cmH20] 25 cmH20   Intake/Output Summary (Last 24 hours) at 04/06/2018 0915 Last data filed at 04/06/2018 0800 Gross per 24 hour  Intake  2261.86 ml  Output -  Net 2261.86 ml   Filed Weights   04/04/18 0411 04/05/18 0242 04/06/18 0500  Weight: 106.3 kg 103.8 kg 103.6 kg    Examination:   General - sedated Eyes - pupils reactive ENT - ETT in place Cardiac - irregular Chest - b/l rhonchi Abdomen - soft, non tender, + bowel sounds Extremities - 1+ edema Skin - no rashes Neuro - intermittently agitated  CXR - interstitial edema (reviewed by me)      Resolved Hospital Problem list     Assessment & Plan:   Acute hypoxic respiratory failure from acute pulmonary edema. Plan - adjust PEEP/FiO2 to keep SpO2 90 to 95% - negative fluid balance as able - f/u CXR, ABG  Acute on chronic systolic CHF with cardiogenic shock. PEA arrest from respiratory failure. Hx of NICM s/p ICD, A fib/flutter. Plan - pressors to keep MAP  > 65  Acute renal failure with ATN. Plan - CRRT  DM. Plan - increase lantus to 20 units daily  Acute metabolic encephalopathy. Plan - RASS goal 0 to -1 - add diprivan, prn versed - continue dilaudid gtt   Best practice:  Diet: Tube feeds DVT prophylaxis: continue coumadin  GI prophylaxis: protonix Mobility: Bedrest  Code Status: FULL Family Communication: no family at bedside   Labs:   CMP Latest Ref Rng & Units 04/06/2018 04/05/2018 04/05/2018  Glucose 70 - 99 mg/dL 305(H) 384(H) 279(H)  BUN 6 -  20 mg/dL 72(H) 48(H) 31(H)  Creatinine 0.61 - 1.24 mg/dL 7.15(H) 4.82(H) 3.34(H)  Sodium 135 - 145 mmol/L 135 133(L) 134(L)  Potassium 3.5 - 5.1 mmol/L 3.8 3.8 4.1  Chloride 98 - 111 mmol/L 97(L) 98 99  CO2 22 - 32 mmol/L 21(L) 20(L) 20(L)  Calcium 8.9 - 10.3 mg/dL 9.8 9.6 9.4  Total Protein 6.5 - 8.1 g/dL - - -  Total Bilirubin 0.3 - 1.2 mg/dL - - -  Alkaline Phos 38 - 126 U/L - - -  AST 15 - 41 U/L - - -  ALT 0 - 44 U/L - - -   CBC Latest Ref Rng & Units 04/06/2018 04/03/2018 04/03/2018  WBC 4.0 - 10.5 K/uL 15.2(H) - 5.9  Hemoglobin 13.0 - 17.0 g/dL 13.0 12.2(L) 11.5(L)    Hematocrit 39.0 - 52.0 % 43.2 36.0(L) 36.8(L)  Platelets 150 - 400 K/uL 387 - 171   ABG    Component Value Date/Time   PHART 7.404 04/03/2018 1036   PCO2ART 41.7 04/03/2018 1036   PO2ART 84.0 04/03/2018 1036   HCO3 26.1 04/03/2018 1036   TCO2 27 04/03/2018 1036   ACIDBASEDEF 3.0 (H) 04/01/2018 2219   O2SAT 96.0 04/03/2018 1036   CBG (last 3)  Recent Labs    04/05/18 2320 04/06/18 0403 04/06/18 0745  GLUCAP 351* 314* 299*    CC time 32 minutes  Chesley Mires, MD [place] 04/06/2018, 9:21 AM

## 2018-04-07 ENCOUNTER — Inpatient Hospital Stay (HOSPITAL_COMMUNITY): Payer: Medicaid Other

## 2018-04-07 LAB — GLUCOSE, CAPILLARY
Glucose-Capillary: 245 mg/dL — ABNORMAL HIGH (ref 70–99)
Glucose-Capillary: 246 mg/dL — ABNORMAL HIGH (ref 70–99)
Glucose-Capillary: 291 mg/dL — ABNORMAL HIGH (ref 70–99)
Glucose-Capillary: 320 mg/dL — ABNORMAL HIGH (ref 70–99)
Glucose-Capillary: 323 mg/dL — ABNORMAL HIGH (ref 70–99)
Glucose-Capillary: 327 mg/dL — ABNORMAL HIGH (ref 70–99)
Glucose-Capillary: 327 mg/dL — ABNORMAL HIGH (ref 70–99)
Glucose-Capillary: 376 mg/dL — ABNORMAL HIGH (ref 70–99)
Glucose-Capillary: 403 mg/dL — ABNORMAL HIGH (ref 70–99)

## 2018-04-07 LAB — RENAL FUNCTION PANEL
Albumin: 2.8 g/dL — ABNORMAL LOW (ref 3.5–5.0)
Albumin: 2.7 g/dL — ABNORMAL LOW (ref 3.5–5.0)
Anion gap: 19 — ABNORMAL HIGH (ref 5–15)
Anion gap: 18 — ABNORMAL HIGH (ref 5–15)
BUN: 103 mg/dL — ABNORMAL HIGH (ref 6–20)
BUN: 109 mg/dL — AB (ref 6–20)
CO2: 20 mmol/L — ABNORMAL LOW (ref 22–32)
CO2: 20 mmol/L — ABNORMAL LOW (ref 22–32)
Calcium: 9.9 mg/dL (ref 8.9–10.3)
Calcium: 9.6 mg/dL (ref 8.9–10.3)
Chloride: 97 mmol/L — ABNORMAL LOW (ref 98–111)
Chloride: 97 mmol/L — ABNORMAL LOW (ref 98–111)
Creatinine, Ser: 10.34 mg/dL — ABNORMAL HIGH (ref 0.61–1.24)
Creatinine, Ser: 11.1 mg/dL — ABNORMAL HIGH (ref 0.61–1.24)
GFR calc Af Amer: 6 mL/min — ABNORMAL LOW (ref >60)
GFR calc Af Amer: 6 mL/min — ABNORMAL LOW (ref >60)
GFR calc non Af Amer: 5 mL/min — ABNORMAL LOW (ref >60)
GFR calc non Af Amer: 5 mL/min — ABNORMAL LOW (ref >60)
Glucose, Bld: 286 mg/dL — ABNORMAL HIGH (ref 70–99)
Glucose, Bld: 289 mg/dL — ABNORMAL HIGH (ref 70–99)
POTASSIUM: 3.6 mmol/L (ref 3.5–5.1)
Phosphorus: 4.8 mg/dL — ABNORMAL HIGH (ref 2.5–4.6)
Phosphorus: 5.2 mg/dL — ABNORMAL HIGH (ref 2.5–4.6)
Potassium: 4.3 mmol/L (ref 3.5–5.1)
Sodium: 136 mmol/L (ref 135–145)
Sodium: 135 mmol/L (ref 135–145)

## 2018-04-07 LAB — CBC
HCT: 41.4 % (ref 39.0–52.0)
Hemoglobin: 12.9 g/dL — ABNORMAL LOW (ref 13.0–17.0)
MCH: 29.1 pg (ref 26.0–34.0)
MCHC: 31.2 g/dL (ref 30.0–36.0)
MCV: 93.5 fL (ref 80.0–100.0)
NRBC: 0.3 % — AB (ref 0.0–0.2)
Platelets: 388 10*3/uL (ref 150–400)
RBC: 4.43 MIL/uL (ref 4.22–5.81)
RDW: 14.5 % (ref 11.5–15.5)
WBC: 13.2 10*3/uL — ABNORMAL HIGH (ref 4.0–10.5)

## 2018-04-07 LAB — BLOOD GAS, ARTERIAL
Acid-base deficit: 3.4 mmol/L — ABNORMAL HIGH (ref 0.0–2.0)
Bicarbonate: 21.9 mmol/L (ref 20.0–28.0)
Drawn by: 10006
FIO2: 50
MECHVT: 580 mL
O2 Saturation: 94.9 %
PATIENT TEMPERATURE: 98.6
PEEP/CPAP: 12 cmH2O
RATE: 18 resp/min
pCO2 arterial: 45.1 mmHg (ref 32.0–48.0)
pH, Arterial: 7.307 — ABNORMAL LOW (ref 7.350–7.450)
pO2, Arterial: 83.5 mmHg (ref 83.0–108.0)

## 2018-04-07 LAB — PROTIME-INR
INR: 2.33
Prothrombin Time: 25.2 seconds — ABNORMAL HIGH (ref 11.4–15.2)

## 2018-04-07 LAB — APTT: aPTT: 46 seconds — ABNORMAL HIGH (ref 24–36)

## 2018-04-07 LAB — MAGNESIUM: Magnesium: 3.6 mg/dL — ABNORMAL HIGH (ref 1.7–2.4)

## 2018-04-07 MED ORDER — INSULIN GLARGINE 100 UNIT/ML ~~LOC~~ SOLN
30.0000 [IU] | Freq: Every day | SUBCUTANEOUS | Status: DC
  Administered 2018-04-07: 30 [IU] via SUBCUTANEOUS
  Filled 2018-04-07 (×2): qty 0.3

## 2018-04-07 MED ORDER — INSULIN REGULAR(HUMAN) IN NACL 100-0.9 UT/100ML-% IV SOLN
INTRAVENOUS | Status: DC
  Administered 2018-04-07: 3.2 [IU]/h via INTRAVENOUS
  Administered 2018-04-08: 4.2 [IU]/h via INTRAVENOUS
  Administered 2018-04-08: 8.2 [IU]/h via INTRAVENOUS
  Filled 2018-04-07 (×4): qty 100

## 2018-04-07 MED ORDER — INSULIN ASPART 100 UNIT/ML ~~LOC~~ SOLN
6.0000 [IU] | SUBCUTANEOUS | Status: DC
  Administered 2018-04-07 (×3): 6 [IU] via SUBCUTANEOUS

## 2018-04-07 MED ORDER — PRISMASOL BGK 4/2.5 32-4-2.5 MEQ/L REPLACEMENT SOLN
Status: DC
  Administered 2018-04-07 – 2018-04-16 (×15): via INTRAVENOUS_CENTRAL
  Filled 2018-04-07 (×20): qty 5000

## 2018-04-07 MED ORDER — HEPARIN (PORCINE) 2000 UNITS/L FOR CRRT
INTRAVENOUS_CENTRAL | Status: DC | PRN
  Administered 2018-04-09 – 2018-04-13 (×3): via INTRAVENOUS_CENTRAL
  Filled 2018-04-07 (×4): qty 1000

## 2018-04-07 MED ORDER — PRISMASOL BGK 4/2.5 32-4-2.5 MEQ/L REPLACEMENT SOLN
Status: DC
  Administered 2018-04-07 – 2018-04-09 (×5): via INTRAVENOUS_CENTRAL
  Filled 2018-04-07 (×6): qty 5000

## 2018-04-07 MED ORDER — PRISMASOL BGK 4/2.5 32-4-2.5 MEQ/L IV SOLN
INTRAVENOUS | Status: DC
  Administered 2018-04-07 – 2018-04-16 (×71): via INTRAVENOUS_CENTRAL
  Filled 2018-04-07 (×100): qty 5000

## 2018-04-07 MED ORDER — ALTEPLASE 2 MG IJ SOLR
2.0000 mg | Freq: Once | INTRAMUSCULAR | Status: AC | PRN
  Administered 2018-04-07: 2 mg
  Filled 2018-04-07: qty 2

## 2018-04-07 NOTE — Progress Notes (Signed)
NAME:  Travis Bryan, MRN:  [id number], DOB:  1970-12-01, LOS: 10 ADMISSION DATE:  03/28/2018, CONSULTATION DATE:  03/28/2018 REFERRING MD:  Graciella Freer, CHIEF COMPLAINT:  Respiratory failure.   Brief History   48 year old man present with fever and nausea and vomiting. Increase in weight and dyspnea.Started on BiPAP and antibiotics for suspicion of pneumonia with superimposed decompensated heart failure. PEA arrest on 1/20. Intubated, ROSC achieved after 6 minutes of CPR and Epi x 2.   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 PCCM consulted for persistent hypoxic respiratory failure despite 15 L per minute HFNC and he was transferred to the ICU.   1/20 PEA Arrest (6 minutes), intubated, started on CVVHD  Consults:  CCM  Heart failure  Nephrology   Procedures:  1/20 ETT 1/20 Fem HD Cath   Significant Diagnostic Tests:  None   Micro Data:  1/18 Resp cx - neg (final)  1/17 RVP - metapneumovirus   1/16 blood - negative  1/16 urine culture - neg  MRSA neg   Antimicrobials:  1/17 doxycycline > 1/18  1/17 vanc > 1/18  1/17 cefepime >> dc'd 1/20 1/21 Ceftriaxone >> 1/23  Interim history/subjective:  Remains on vent, pressors, CRRT.  Objective   Blood pressure 101/71, pulse 67, temperature 98.6 F (37 C), temperature source Oral, resp. rate 18, height 5\' 10"  (1.778 m), weight 101.2 kg, SpO2 99 %.    Vent Mode: PRVC FiO2 (%):  [40 %-50 %] 50 % Set Rate:  [18 bmp-24 bmp] 18 bmp Vt Set:  [580 mL] 580 mL PEEP:  [10 HTX77-41 cmH20] 12 cmH20 Plateau Pressure:  [25 cmH20-28 cmH20] 26 cmH20   Intake/Output Summary (Last 24 hours) at 04/07/2018 0708 Last data filed at 04/07/2018 0600 Gross per 24 hour  Intake 2136.89 ml  Output 0 ml  Net 2136.89 ml     Filed Weights   04/05/18 0242 04/06/18 0500 04/07/18 0427  Weight: 103.8 kg 103.6 kg 101.2 kg    Examination:   General - sedated Eyes - pupils reactive ENT - ETT in place Cardiac - RRR, no m/r/g Chest - CTAB, no wheezing  Abdomen - soft, non tender, + bowel sounds Extremities - 1+ edema Skin - no rashes Neuro - intermittently agitated  Resolved Hospital Problem list     Assessment & Plan:   Acute hypoxic respiratory failure: Due to pulmonary edema. Decompensated HF from metapneumovirus. Complicated by cardiorenal syndrome. Day 7 on vent.  -- Adjust PEEP/FiO2 to keep SpO2 90 to 95%; Wean PEEP 12-> 10 today  -- CVVHD per nephro; keeping even today  -- Dilaudid & propfolol for RASS goal, wean prop as able  Acute on chronic systolic CHF with cardiogenic shock PEA arrest from respiratory failure 1/20 Hx of NICM s/p ICD, A fib/flutter -- Continue home amiodarone, remains in NSR -- Warfarin per pharmacy  -- Levophed to keep MAP  > 65 -- Holding HF meds with hypotension   Acute renal failure with ATN: No sign of renal recovery thus far, remains anuric  -- CVVHD per nephro   DM: Uncontrolled -- Increase lantus to 30 units daily -- Increase tube feed coverage Novolog 6 units q4h -- SSI-resistant S2L  Acute metabolic encephalopathy: -- RASS goal 0 to -1 -- Propofol & prn versed added yesterday  --  Continue dilaudid gtt   Best practice:  Diet: Tube feeds DVT prophylaxis: continue coumadin  GI prophylaxis: protonix Mobility: Bedrest  Code Status: FULL Family Communication: no family at bedside  Velna Ochs, M.D. - PGY3 Pager: [phone] 04/07/2018, 7:09 AM

## 2018-04-07 NOTE — Progress Notes (Signed)
[facility] for Warfarin Indication: atrial fibrillation  Patient Measurements: Height: 5\' 10"  (177.8 cm) Weight: 223 lb 1.7 oz (101.2 kg) IBW/kg (Calculated) : 73  Vital Signs: Temp: 98.1 F (36.7 C) (01/26 0734) Temp Source: Axillary (01/26 0734) BP: 103/60 (01/26 0830) Pulse Rate: 67 (01/26 0830)  Labs: Recent Labs    04/05/18 0354 04/05/18 1351  04/06/18 0416 04/06/18 1532 04/07/18 0500  HGB  --   --   --  13.0  --  12.9*  HCT  --   --   --  43.2  --  41.4  PLT  --   --   --  387  --  388  APTT 73*  --   --  39*  --  46*  LABPROT 28.4* 22.7*  --  19.4*  --  25.2*  INR 2.72 2.03  --  1.66  --  2.33  CREATININE 3.34*  --    < > 7.15* 8.94* 10.34*   < > = values in this interval not displayed.    Assessment: 48 year old male presenting with NV and fever, h/o afib on warfarin PTA. Pharmacy consulted to continue warfarin dosing.   Patient had a supratherapeutic INR on 1/21 - he received FFP in an attempt to lower the INR. On 1/24, the INR was still > 2 , he received 1 mg of Vitamin K in anticipation of needing to place a dialysis catheter. Warfarin was restarted yesterday evening, prior to that his last dose was on 1/19. His INR went from 1.6 > 2.3 after one dose of warfarin.  PTA warfarin dose: 7.5 mg MWF, 5 mg TTSS   Goal of Therapy:  INR 2-3 Monitor platelets by anticoagulation protocol: Yes   Plan:  Hold warfarin tonight Daily INR   Harvel Quale 04/07/2018 9:26 AM

## 2018-04-07 NOTE — Progress Notes (Signed)
Patient ID: Travis Bryan, male   DOB: 24-Nov-1970, 48 y.o.   MRN: [id number] [facility] Progress Note   Assessment/ Plan:   1.  Acute kidney injury on chronic kidney disease stage IIIb/IV: (baseline creatinine 2.5-2.8) likely secondary to ATN versus cardiorenal mechanism; anuric and without evidence of renal recovery (difficult to predict whether he will have sufficient renal recovery to come off of dialysis).  Because he remains pressor dependent, will restart CRRT and keep him fluid even for now. 2.  Acute hypoxic respiratory failure: Remains on ventilator support, euvolemic to possibly hypovolemic with ultrafiltration/CRRT. 3.  Hyponatremia: Mild and secondary to acute kidney injury/free water excretion defect.  Improving slowly. 4.  History of atrial fibrillation/atrial flutter status post DCCV and ablation. 5. Acute exacerbation of CHF with cardiogenic shock: remains on pressors  Subjective:   Without acute events overnight, remains on pressors.   Objective:   BP 99/68 (BP Location: Left Arm)   Pulse 68   Temp 98.1 F (36.7 C) (Axillary)   Resp 18   Ht _0  (1.778 m)   Wt 101.2 kg   SpO2 99%   BMI 32.01 kg/m   Intake/Output Summary (Last 24 hours) at 04/07/2018 0809 Last data filed at 04/07/2018 0800 Gross per 24 hour  Intake 2168.72 ml  Output 0 ml  Net 2168.72 ml   Weight change: -2.4 kg  Physical Exam: Gen: Intubated, sedated CVS: Pulse regular rhythm, normal rate, normal S1 and S2 Resp: Anteriorly clear to auscultation, no rales/rhonchi Abd: Soft, distended, nontender, bowel sounds normal Ext: Trace dependent edema, no lower extremity edema  Imaging: Dg Chest Port 1 View  Result Date: 04/07/2018 CLINICAL DATA:  Respiratory failure. EXAM: PORTABLE CHEST 1 VIEW COMPARISON:  Chest x-rays dated 04/05/2018 and 04/03/2018. FINDINGS: Persistent streaky opacities at the RIGHT lung base, not significantly changed in the interval. No new lung findings. No  pneumothorax seen. Endotracheal tube is well positioned with tip just above the level of the carina. RIGHT IJ catheter is stable in position. Enteric tube passes below the diaphragm. LEFT chest wall pacemaker/ICD lead appears stable in position. IMPRESSION: No significant interval change. Persistent opacity at the RIGHT lung base, pneumonia versus asymmetric edema. Support apparatus is stable in position. Electronically Signed   By: Franki Cabot M.D.   On: 04/07/2018 06:30   Dg Chest Port 1 View  Result Date: 04/05/2018 CLINICAL DATA:  Hemodialysis catheter dysfunction. EXAM: PORTABLE CHEST 1 VIEW COMPARISON:  April 03, 2018 FINDINGS: A new right hemodialysis catheter terminates in the central SVC. No pneumothorax. Cardiomegaly. Hila and mediastinum are unchanged. Focal opacity in the right base appears mildly improved. No overt edema. IMPRESSION: 1. New right central line without pneumothorax. The central line terminates in the central SVC. 2. Opacity in the medial right lung base may represent atelectasis or infiltrate/pneumonia. Recommend clinical correlation and follow-up to resolution. 3. The ETT is in good position. The NG tube terminates below today's film. Electronically Signed   By: Dorise Bullion III M.D   On: 04/05/2018 17:13    Labs: BMET Recent Labs  Lab 04/04/18 1840 04/04/18 1611 04/05/18 0354 04/05/18 1502 04/06/18 0416 04/06/18 1532 04/07/18 0500  NA 133* 134* 134* 133* 135 134* 136  K 3.7 4.1 4.1 3.8 3.8 3.9 3.6  CL 99 98 99 98 97* 91* 97*  CO2 22 21* 20* 20* 21* 23 20*  GLUCOSE 231* 272* 279* 384* 305* 382* 286*  BUN 29* 30* 31* 48* 72* 86* 103*  CREATININE 3.43* 3.36* 3.34* 4.82* 7.15* 8.94* 10.34*  CALCIUM 8.9 9.4 9.4 9.6 9.8 9.9 9.9  PHOS 2.2* 3.8 2.5 2.1* 2.0* 5.3* 4.8*   CBC Recent Labs  Lab 04/02/18 0820 04/03/18 0347 04/03/18 1036 04/06/18 0416 04/07/18 0500  WBC 4.4 5.9  --  15.2* 13.2*  HGB 11.9* 11.5* 12.2* 13.0 12.9*  HCT 39.8 36.8* 36.0* 43.2  41.4  MCV 95.9 93.4  --  94.1 93.5  PLT 131* 171  --  387 388    Medications:    . allopurinol  300 mg Per Tube Daily  . amiodarone  200 mg Per Tube Daily  . atorvastatin  40 mg Per Tube q1800  . chlorhexidine gluconate (MEDLINE KIT)  15 mL Mouth Rinse BID  . Chlorhexidine Gluconate Cloth  6 each Topical Daily  . feeding supplement (PRO-STAT SUGAR FREE 64)  30 mL Per Tube QID  . feeding supplement (VITAL HIGH PROTEIN)  1,000 mL Per Tube Q24H  . insulin aspart  0-20 Units Subcutaneous Q4H  . insulin aspart  6 Units Subcutaneous Q4H  . insulin glargine  30 Units Subcutaneous Daily  . mouth rinse  15 mL Mouth Rinse 10 times per day  . nystatin  5 mL Oral QID  . pantoprazole sodium  40 mg Per Tube Daily  . senna-docusate  1 tablet Oral BID  . sodium chloride flush  10-40 mL Intracatheter Q12H  . Warfarin - Pharmacist Dosing Inpatient   Does not apply E2336   Elmarie Shiley, MD 04/07/2018, 8:09 AM

## 2018-04-07 NOTE — Progress Notes (Signed)
[facility] Progress Note Patient Name: Travis Bryan DOB: October 23, 1970 MRN: [id number]   Date of Service  04/07/2018  HPI/Events of Note  Blood glucose = 403. Already on Q 4 hour resistant SSI. Lantus.   eICU Interventions  Will order: 1. D/C current insulin orders.  2. Insulin IV infusion. Titrate per glucose stabilizer protocol.      Intervention Category Major Interventions: Hyperglycemia - active titration of insulin therapy  Lysle Dingwall 04/07/2018, 7:41 PM

## 2018-04-08 ENCOUNTER — Inpatient Hospital Stay (HOSPITAL_COMMUNITY): Payer: Medicaid Other

## 2018-04-08 DIAGNOSIS — R570 Cardiogenic shock: Secondary | ICD-10-CM

## 2018-04-08 LAB — CBC
HCT: 39.8 % (ref 39.0–52.0)
Hemoglobin: 11.9 g/dL — ABNORMAL LOW (ref 13.0–17.0)
MCH: 28.1 pg (ref 26.0–34.0)
MCHC: 29.9 g/dL — ABNORMAL LOW (ref 30.0–36.0)
MCV: 94.1 fL (ref 80.0–100.0)
PLATELETS: 375 10*3/uL (ref 150–400)
RBC: 4.23 MIL/uL (ref 4.22–5.81)
RDW: 14.5 % (ref 11.5–15.5)
WBC: 11.3 10*3/uL — ABNORMAL HIGH (ref 4.0–10.5)
nRBC: 0.2 % (ref 0.0–0.2)

## 2018-04-08 LAB — APTT: aPTT: 41 seconds — ABNORMAL HIGH (ref 24–36)

## 2018-04-08 LAB — RENAL FUNCTION PANEL
Albumin: 2.6 g/dL — ABNORMAL LOW (ref 3.5–5.0)
Albumin: 2.8 g/dL — ABNORMAL LOW (ref 3.5–5.0)
Anion gap: 16 — ABNORMAL HIGH (ref 5–15)
Anion gap: 19 — ABNORMAL HIGH (ref 5–15)
BUN: 103 mg/dL — ABNORMAL HIGH (ref 6–20)
BUN: 70 mg/dL — ABNORMAL HIGH (ref 6–20)
CO2: 20 mmol/L — ABNORMAL LOW (ref 22–32)
CO2: 21 mmol/L — ABNORMAL LOW (ref 22–32)
Calcium: 9.2 mg/dL (ref 8.9–10.3)
Calcium: 9.3 mg/dL (ref 8.9–10.3)
Chloride: 100 mmol/L (ref 98–111)
Chloride: 95 mmol/L — ABNORMAL LOW (ref 98–111)
Creatinine, Ser: 10.34 mg/dL — ABNORMAL HIGH (ref 0.61–1.24)
Creatinine, Ser: 6.83 mg/dL — ABNORMAL HIGH (ref 0.61–1.24)
GFR calc non Af Amer: 5 mL/min — ABNORMAL LOW (ref >60)
GFR calc non Af Amer: 9 mL/min — ABNORMAL LOW (ref >60)
GFR, EST AFRICAN AMERICAN: 6 mL/min — AB (ref >60)
GFR, EST AFRICAN AMERICAN: 10 mL/min — AB (ref >60)
Glucose, Bld: 186 mg/dL — ABNORMAL HIGH (ref 70–99)
Glucose, Bld: 163 mg/dL — ABNORMAL HIGH (ref 70–99)
Phosphorus: 5.1 mg/dL — ABNORMAL HIGH (ref 2.5–4.6)
Phosphorus: 5.1 mg/dL — ABNORMAL HIGH (ref 2.5–4.6)
Potassium: 3.7 mmol/L (ref 3.5–5.1)
Potassium: 4.3 mmol/L (ref 3.5–5.1)
Sodium: 136 mmol/L (ref 135–145)
Sodium: 135 mmol/L (ref 135–145)

## 2018-04-08 LAB — GLUCOSE, CAPILLARY
Glucose-Capillary: 130 mg/dL — ABNORMAL HIGH (ref 70–99)
Glucose-Capillary: 130 mg/dL — ABNORMAL HIGH (ref 70–99)
Glucose-Capillary: 140 mg/dL — ABNORMAL HIGH (ref 70–99)
Glucose-Capillary: 144 mg/dL — ABNORMAL HIGH (ref 70–99)
Glucose-Capillary: 144 mg/dL — ABNORMAL HIGH (ref 70–99)
Glucose-Capillary: 147 mg/dL — ABNORMAL HIGH (ref 70–99)
Glucose-Capillary: 161 mg/dL — ABNORMAL HIGH (ref 70–99)
Glucose-Capillary: 163 mg/dL — ABNORMAL HIGH (ref 70–99)
Glucose-Capillary: 163 mg/dL — ABNORMAL HIGH (ref 70–99)
Glucose-Capillary: 166 mg/dL — ABNORMAL HIGH (ref 70–99)
Glucose-Capillary: 169 mg/dL — ABNORMAL HIGH (ref 70–99)
Glucose-Capillary: 172 mg/dL — ABNORMAL HIGH (ref 70–99)
Glucose-Capillary: 173 mg/dL — ABNORMAL HIGH (ref 70–99)
Glucose-Capillary: 173 mg/dL — ABNORMAL HIGH (ref 70–99)
Glucose-Capillary: 177 mg/dL — ABNORMAL HIGH (ref 70–99)
Glucose-Capillary: 178 mg/dL — ABNORMAL HIGH (ref 70–99)
Glucose-Capillary: 178 mg/dL — ABNORMAL HIGH (ref 70–99)
Glucose-Capillary: 180 mg/dL — ABNORMAL HIGH (ref 70–99)
Glucose-Capillary: 183 mg/dL — ABNORMAL HIGH (ref 70–99)
Glucose-Capillary: 228 mg/dL — ABNORMAL HIGH (ref 70–99)
Glucose-Capillary: 235 mg/dL — ABNORMAL HIGH (ref 70–99)
Glucose-Capillary: 240 mg/dL — ABNORMAL HIGH (ref 70–99)
Glucose-Capillary: 264 mg/dL — ABNORMAL HIGH (ref 70–99)

## 2018-04-08 LAB — PROTIME-INR
INR: 2.62
PROTHROMBIN TIME: 27.6 s — AB (ref 11.4–15.2)

## 2018-04-08 LAB — MAGNESIUM: Magnesium: 3.3 mg/dL — ABNORMAL HIGH (ref 1.7–2.4)

## 2018-04-08 MED ORDER — DOCUSATE SODIUM 50 MG/5ML PO LIQD
50.0000 mg | Freq: Two times a day (BID) | ORAL | Status: DC
  Administered 2018-04-08 – 2018-04-19 (×17): 50 mg
  Filled 2018-04-08 (×21): qty 10

## 2018-04-08 MED ORDER — SENNOSIDES 8.8 MG/5ML PO SYRP
5.0000 mL | ORAL_SOLUTION | Freq: Two times a day (BID) | ORAL | Status: DC
  Administered 2018-04-08 – 2018-04-21 (×17): 5 mL
  Filled 2018-04-08 (×28): qty 5

## 2018-04-08 MED ORDER — ALTEPLASE 2 MG IJ SOLR
2.0000 mg | Freq: Once | INTRAMUSCULAR | Status: AC | PRN
  Administered 2018-04-08: 2 mg
  Filled 2018-04-08 (×3): qty 2

## 2018-04-08 MED ORDER — WARFARIN SODIUM 2 MG PO TABS
2.0000 mg | ORAL_TABLET | Freq: Once | ORAL | Status: DC
  Filled 2018-04-08: qty 1

## 2018-04-08 NOTE — Progress Notes (Signed)
Patient ID: Travis Bryan, male   DOB: [birth year], 48 y.o.   MRN: [id number] [facility] Progress Note   Assessment/ Plan:   1.  Acute kidney injury on chronic kidney disease stage IIIb/IV: (baseline creatinine 2.5-2.8) likely secondary to ATN versus cardiorenal mechanism; anuric and without evidence of renal recovery (difficult to predict whether he will have sufficient renal recovery to come off of dialysis).  CRRT overnight interrupted by problems with catheter flows requiring TPA; now working well and anticipate improvement of dialysis delivery/dose.  Remains pressor dependent. 2.  Acute hypoxic respiratory failure: Remains on ventilator support, euvolemic to possibly hypovolemic.  No ultrafiltration with CRRT at this time. 3.  Hyponatremia: Mild and secondary to acute kidney injury/free water excretion defect.  Improving slowly. 4.  History of atrial fibrillation/atrial flutter status post DCCV and ablation. 5. Acute exacerbation of CHF with cardiogenic shock: remains on pressors  Subjective:   Stuttering course with CRRT overnight because of problems with dialysis catheter/flows.   Objective:   BP 92/73   Pulse 70   Temp 98.2 F (36.8 C) (Axillary)   Resp 18   Ht '5\' 10"'  (1.778 m)   Wt 107.1 kg   SpO2 96%   BMI 33.88 kg/m   Intake/Output Summary (Last 24 hours) at 04/08/2018 0745 Last data filed at 04/08/2018 0700 Gross per 24 hour  Intake 2278.85 ml  Output 703 ml  Net 1575.85 ml   Weight change: 5.9 kg  Physical Exam: Gen: Intubated, sedated CVS: Pulse regular rhythm, normal rate, normal S1 and S2 Resp: Anteriorly clear to auscultation, no rales/rhonchi Abd: Soft, distended, nontender, bowel sounds normal Ext: Trace dependent edema, no lower extremity edema  Imaging: Dg Chest Port 1 View  Result Date: 04/08/2018 CLINICAL DATA:  Respiratory failure. EXAM: PORTABLE CHEST 1 VIEW COMPARISON:  04/07/2018 FINDINGS: Endotracheal tube terminates 4 cm above the  carina. Enteric tube courses into the upper abdomen with tip not imaged. Right jugular catheter and an ICD remain in place. The cardiac silhouette remains enlarged. Lung volumes remain low. Right greater than left basilar opacities have not significantly changed. No sizable pleural effusion or pneumothorax is identified. IMPRESSION: Unchanged right greater than left basilar lung opacities which may reflect atelectasis and pneumonia or edema. Electronically Signed   By: Logan Bores M.D.   On: 04/08/2018 07:15   Dg Chest Port 1 View  Result Date: 04/07/2018 CLINICAL DATA:  Respiratory failure. EXAM: PORTABLE CHEST 1 VIEW COMPARISON:  Chest x-rays dated 04/05/2018 and 04/03/2018. FINDINGS: Persistent streaky opacities at the RIGHT lung base, not significantly changed in the interval. No new lung findings. No pneumothorax seen. Endotracheal tube is well positioned with tip just above the level of the carina. RIGHT IJ catheter is stable in position. Enteric tube passes below the diaphragm. LEFT chest wall pacemaker/ICD lead appears stable in position. IMPRESSION: No significant interval change. Persistent opacity at the RIGHT lung base, pneumonia versus asymmetric edema. Support apparatus is stable in position. Electronically Signed   By: Franki Cabot M.D.   On: 04/07/2018 06:30   Dg Abd Portable 1v  Result Date: 04/07/2018 CLINICAL DATA:  Check gastric catheter placement EXAM: PORTABLE ABDOMEN - 1 VIEW COMPARISON:  04/02/2018 FINDINGS: Scattered large and small bowel gas is noted. Gastric catheter is noted within the stomach. No bony abnormality is seen. IMPRESSION: Gastric catheter within the stomach. Electronically Signed   By: Inez Catalina M.D.   On: 04/07/2018 21:32    Labs: [employer]  04/05/18 0354 04/05/18 1502 04/06/18 0416 04/06/18 1532 04/07/18 0500 04/07/18 1644 04/08/18 0500  NA 134* 133* 135 134* 136 135 136  K 4.1 3.8 3.8 3.9 3.6 4.3 3.7  CL 99 98 97* 91* 97* 97* 100   CO2 20* 20* 21* 23 20* 20* 20*  GLUCOSE 279* 384* 305* 382* 286* 289* 186*  BUN 31* 48* 72* 86* 103* 109* 103*  CREATININE 3.34* 4.82* 7.15* 8.94* 10.34* 11.10* 10.34*  CALCIUM 9.4 9.6 9.8 9.9 9.9 9.6 9.2  PHOS 2.5 2.1* 2.0* 5.3* 4.8* 5.2* 5.1*   CBC Recent Labs  Lab 04/03/18 0347 04/03/18 1036 04/06/18 0416 04/07/18 0500 04/08/18 0500  WBC 5.9  --  15.2* 13.2* 11.3*  HGB 11.5* 12.2* 13.0 12.9* 11.9*  HCT 36.8* 36.0* 43.2 41.4 39.8  MCV 93.4  --  94.1 93.5 94.1  PLT 171  --  387 388 375    Medications:    . allopurinol  300 mg Per Tube Daily  . amiodarone  200 mg Per Tube Daily  . atorvastatin  40 mg Per Tube q1800  . chlorhexidine gluconate (MEDLINE KIT)  15 mL Mouth Rinse BID  . Chlorhexidine Gluconate Cloth  6 each Topical Daily  . feeding supplement (PRO-STAT SUGAR FREE 64)  30 mL Per Tube QID  . feeding supplement (VITAL HIGH PROTEIN)  1,000 mL Per Tube Q24H  . mouth rinse  15 mL Mouth Rinse 10 times per day  . nystatin  5 mL Oral QID  . pantoprazole sodium  40 mg Per Tube Daily  . senna-docusate  1 tablet Oral BID  . sodium chloride flush  10-40 mL Intracatheter Q12H  . Warfarin - Pharmacist Dosing Inpatient   Does not apply [id number]   Elmarie Shiley, MD 04/08/2018, 7:45 AM

## 2018-04-08 NOTE — Progress Notes (Signed)
Informed Dr. Philipp Ovens concerning patient's Hemodialysis catheter needing replacement. Will hold coumadin and awaiting orders for possible reversible d/t patient's PT/INR being elevated. Patient remains on CRRT at this time. Dr. Posey Pronto aware as well.

## 2018-04-08 NOTE — Progress Notes (Addendum)
Patient ID: Travis Bryan, male   DOB: [birth year], 48 y.o.   MRN: [id number]     Advanced Heart Failure Rounding Note  PCP-Cardiologist: No primary care provider on file.   Subjective:    Started CVVHD 1/20 . Intubated 1/20  Remains on dilaudid drip  And NE at 16 mcg. Remains on CVVHD keeping even.   Remains intubated and sedated. Opens eyes to voice, but does not follow command.   Cr 10.34. Remains anuric.   Objective:   Weight Range: 107.1 kg Body mass index is 33.88 kg/m.   Vital Signs:   Temp:  [97.7 F (36.5 C)-98.6 F (37 C)] 98.2 F (36.8 C) (01/27 0726) Pulse Rate:  [60-82] 66 (01/27 0815) Resp:  [15-28] 18 (01/27 0815) BP: (81-131)/(58-85) 113/63 (01/27 0811) SpO2:  [73 %-100 %] 97 % (01/27 0815) Arterial Line BP: (87-137)/(50-69) 113/63 (01/27 0815) FiO2 (%):  [50 %-80 %] 60 % (01/27 0811) Weight:  [107.1 kg] 107.1 kg (01/27 0500) Last BM Date: 04/07/18  Weight change: Filed Weights   04/06/18 0500 04/07/18 0427 04/08/18 0500  Weight: 103.6 kg 101.2 kg 107.1 kg    Intake/Output:   Intake/Output Summary (Last 24 hours) at 04/08/2018 0856 Last data filed at 04/08/2018 0800 Gross per 24 hour  Intake 2326.44 ml  Output 788 ml  Net 1538.44 ml    Physical Exam   General: Intubated/sedated.  HEENT: + ETT Neck: Supple. JVP 6-7 cm. Carotids 2+ bilat; no bruits. No thyromegaly or nodule noted. Cor: PMI nondisplaced. RRR, No M/G/R noted Lungs: Diminished basilar sounds Abdomen: Soft, non-tender, non-distended, no HSM. No bruits or masses. +BS  Extremities: No cyanosis, clubbing, rash, or edema. R femoral HD cath.  Neuro: Intubated/sedated   Telemetry   NSR 70-80s, personally reviewed.   Labs    CBC Recent Labs    04/07/18 0500 04/08/18 0500  WBC 13.2* 11.3*  HGB 12.9* 11.9*  HCT 41.4 39.8  MCV 93.5 94.1  PLT 388 631   Basic Metabolic Panel Recent Labs    04/07/18 0500 04/07/18 1644 04/08/18 0500  NA 136 135 136  K 3.6 4.3 3.7  CL  97* 97* 100  CO2 20* 20* 20*  GLUCOSE 286* 289* 186*  BUN 103* 109* 103*  CREATININE 10.34* 11.10* 10.34*  CALCIUM 9.9 9.6 9.2  MG 3.6*  --  3.3*  PHOS 4.8* 5.2* 5.1*   Liver Function Tests Recent Labs    04/07/18 1644 04/08/18 0500  ALBUMIN 2.7* 2.6*   No results for input(s): LIPASE, AMYLASE in the last 72 hours. Cardiac Enzymes No results for input(s): CKTOTAL, CKMB, CKMBINDEX, TROPONINI in the last 72 hours.  BNP: BNP (last 3 results) Recent Labs    02/25/18 1235 03/28/18 1040  BNP 207.2* 683.2*    ProBNP (last 3 results) No results for input(s): PROBNP in the last 8760 hours.   D-Dimer No results for input(s): DDIMER in the last 72 hours. Hemoglobin A1C No results for input(s): HGBA1C in the last 72 hours. Fasting Lipid Panel Recent Labs    04/06/18 1312  TRIG 288*   Thyroid Function Tests No results for input(s): TSH, T4TOTAL, T3FREE, THYROIDAB in the last 72 hours.  Invalid input(s): FREET3  Other results:   Imaging    Dg Chest Port 1 View  Result Date: 04/08/2018 CLINICAL DATA:  Respiratory failure. EXAM: PORTABLE CHEST 1 VIEW COMPARISON:  04/07/2018 FINDINGS: Endotracheal tube terminates 4 cm above the carina. Enteric tube courses into the upper abdomen with tip  not imaged. Right jugular catheter and an ICD remain in place. The cardiac silhouette remains enlarged. Lung volumes remain low. Right greater than left basilar opacities have not significantly changed. No sizable pleural effusion or pneumothorax is identified. IMPRESSION: Unchanged right greater than left basilar lung opacities which may reflect atelectasis and pneumonia or edema. Electronically Signed   By: Logan Bores M.D.   On: 04/08/2018 07:15   Dg Abd Portable 1v  Result Date: 04/07/2018 CLINICAL DATA:  Check gastric catheter placement EXAM: PORTABLE ABDOMEN - 1 VIEW COMPARISON:  04/02/2018 FINDINGS: Scattered large and small bowel gas is noted. Gastric catheter is noted within the  stomach. No bony abnormality is seen. IMPRESSION: Gastric catheter within the stomach. Electronically Signed   By: Inez Catalina M.D.   On: 04/07/2018 21:32     Medications:     Scheduled Medications: . allopurinol  300 mg Per Tube Daily  . amiodarone  200 mg Per Tube Daily  . atorvastatin  40 mg Per Tube q1800  . chlorhexidine gluconate (MEDLINE KIT)  15 mL Mouth Rinse BID  . Chlorhexidine Gluconate Cloth  6 each Topical Daily  . feeding supplement (PRO-STAT SUGAR FREE 64)  30 mL Per Tube QID  . feeding supplement (VITAL HIGH PROTEIN)  1,000 mL Per Tube Q24H  . mouth rinse  15 mL Mouth Rinse 10 times per day  . nystatin  5 mL Oral QID  . pantoprazole sodium  40 mg Per Tube Daily  . senna-docusate  1 tablet Oral BID  . sodium chloride flush  10-40 mL Intracatheter Q12H  . Warfarin - Pharmacist Dosing Inpatient   Does not apply q1800    Infusions: .  prismasol BGK 4/2.5 400 mL/hr at 04/07/18 1409  .  prismasol BGK 4/2.5 400 mL/hr at 04/08/18 0733  . sodium chloride Stopped (04/05/18 0948)  . HYDROmorphone 0.5 mg/hr (04/08/18 0800)  . insulin 6.8 Units/hr (04/08/18 0819)  . norepinephrine (LEVOPHED) Adult infusion 16 mcg/min (04/08/18 0800)  . prismasol BGK 4/2.5 2,000 mL/hr at 04/07/18 2352  . propofol (DIPRIVAN) infusion 20 mcg/kg/min (04/08/18 0800)    PRN Medications: sodium chloride, acetaminophen **OR** acetaminophen, albuterol, bisacodyl, diphenhydrAMINE, fentaNYL, heparin, heparin, heparin, HYDROmorphone, lip balm, lip balm, midazolam, ondansetron (ZOFRAN) IV **OR** [DISCONTINUED] ondansetron, sodium chloride flush    Assessment/Plan   1. Acute hypoxemic respiratory failure: Suspect bilateral PNA superimposed on viral PNA. He grew metapneumovirus.  However, PCT up to 30 is suggestive of bacterial PNA.  He is now afebrile.  Intubated 1/20 after suspected aspiration event.  - Completed ABX course.  2. Acute on chronic systolic CHF: He has a nonischemic cardiomyopathy. St  Jude ICD.  Echo (12/19) with EF 25-30%.  His creatinine rose up to 8.98 from baseline around 2.5-2.8. He was volume overloaded and did not respond to high dose diuretics so CVVH begun. CVVH keeping even.  - Remains on NE 16 mcg. MAPs 70s, SBP 100-110s via art line.   - Off Hydralazine/Imdur/amlodipine/coreg.   - He has LBBB with QRS that has widened considerably.  May eventually be candidate for upgrade to CRT-D.  3. AKI: In setting of CKD stage IV.  Suspect cardiorenal syndrome => ATN.   Started CVVH 1/20 renal following.  - CVVH keeping even. Remains pressor dependent.  4. Atrial fibrillation: Paroxysmal. - Remains in NSR on amio.  - Continue warfarin.  5. H/o VT: On amiodarone 200 mg daily.  - No change.   Remains CRRT and pressor dependent. Euvolemic to ?  Hypovolemic. Renal following closely. Remains intubated and sedated.   Length of Stay: [address]  Annamaria Helling  04/08/2018, 8:56 AM  Advanced Heart Failure Team Pager [phone] (M-F; 7a - 4p)  Please contact [facility] for night-coverage after hours (4p -7a ) and weekends on [email or web address]  Patient seen with PA, agree with the above note.   Having difficulty weaning him from vent.  Norepinephrine has been weaned down to 10.  Still on CVVH running even with no evidence for renal recovery.    I suspect that he will HD-dependent.  Continue to wean norepinephrine for MAP 65 and above.  Should be able to continue weaning today.   He remains in NSR.   Loralie Champagne 04/08/2018 1:34 PM

## 2018-04-08 NOTE — Progress Notes (Addendum)
NAME:  Travis Bryan, MRN:  [id number], DOB:  08/02/1970, LOS: 11 ADMISSION DATE:  03/28/2018, CONSULTATION DATE:  03/28/2018 REFERRING MD:  Graciella Freer, CHIEF COMPLAINT:  Respiratory failure.   Brief History   48 year old man present with fever and nausea and vomiting. Increase in weight and dyspnea.Started on BiPAP and antibiotics for suspicion of pneumonia with superimposed decompensated heart failure. PEA arrest on 1/20. Intubated, ROSC achieved after 6 minutes of CPR and Epi x 2.   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 PCCM consulted for persistent hypoxic respiratory failure despite 15 L per minute HFNC and he was transferred to the ICU.   1/20 PEA Arrest (6 minutes), intubated, started on CVVHD  Consults:  CCM  Heart failure  Nephrology   Procedures:  1/20 ETT 1/20 Fem HD Cath   Significant Diagnostic Tests:  None   Micro Data:  1/18 Resp cx - neg (final)  1/17 RVP - metapneumovirus   1/16 blood - negative  1/16 urine culture - neg  MRSA neg   Antimicrobials:  1/17 doxycycline > 1/18  1/17 vanc > 1/18  1/17 cefepime >> dc'd 1/20 1/21 Ceftriaxone >> 1/23  Interim history/subjective:  Started on glucose drip overnight. Sedated on vent this morning.   Objective   Blood pressure 91/68, pulse 67, temperature 98.2 F (36.8 C), temperature source Oral, resp. rate 18, height 5\' 10"  (1.778 m), weight 107.1 kg, SpO2 96 %.    Vent Mode: PRVC FiO2 (%):  [50 %-80 %] 60 % Set Rate:  [18 bmp] 18 bmp Vt Set:  [580 mL] 580 mL PEEP:  [10 cmH20-12 cmH20] 12 cmH20 Plateau Pressure:  [24 cmH20-29 cmH20] 27 cmH20   Intake/Output Summary (Last 24 hours) at 04/08/2018 0720 Last data filed at 04/08/2018 0700 Gross per 24 hour  Intake 2278.85 ml    Output 636 ml  Net 1642.85 ml   Filed Weights   04/06/18 0500 04/07/18 0427 04/08/18 0500  Weight: 103.6 kg 101.2 kg 107.1 kg    Examination:   General - sedated Eyes - pupils reactive ENT - ETT in place Cardiac - RRR, no m/r/g Chest - CTAB, no wheezing  Abdomen - soft, non tender, + bowel sounds Extremities - trace edema Skin - no rashes Neuro - RASS -4  Resolved Hospital Problem list     Assessment & Plan:   Acute Hypoxic Respiratory Failure: Due to pulmonary edema. Decompensated HF from metapneumovirus. Complicated by cardiorenal syndrome. Day 8 on vent. Difficulty weaning.  -- Adjust PEEP/FiO2 to keep SpO2 > 88% -- May need to discuss tracheostomy if unable to wean by the end of this week  -- CVVHD per nephro; keeping even today  -- Continue Dilaudid gtt for RASS goal 0 to -1 -- Wean Propofol off  Acute on chronic systolic CHF with cardiogenic shock PEA arrest from respiratory failure 1/20 Hx of NICM s/p ICD, A fib/flutter -- Continue home amiodarone, remains in NSR -- Warfarin per pharmacy  -- Levophed to keep MAP  > 65 -- Holding HF meds with hypotension   Acute Renal Failure: Cardiorenal / ATN. No sign of recovery thus far, remains anuric  -- CVVHD per nephro   DM: Uncontrolled, started on insulin drip overnight, CBGs improved this morning but labs with anion gap of 16.  Requiring 7.2 units / hr.  -- Continue insulin gtt for now  -- Repeat afternoon labs   Acute metabolic encephalopathy: -- RASS goal 0 to -1 -- Wean propofol off  -- Continue dilaudid gtt   Best practice:  Diet: Tube feeds DVT prophylaxis: continue coumadin  GI prophylaxis: protonix Glucose: Insulin drip  Mobility: Bedrest  Code Status: FULL Family Communication: no family at bedside  Velna Ochs, M.D. - PGY3 Pager: [phone] 04/08/2018, 7:20 AM

## 2018-04-08 NOTE — Progress Notes (Addendum)
ANTICOAGULATION CONSULT NOTE   Pharmacy Consult for Warfarin Indication: atrial fibrillation  Patient Measurements: Height: 5\' 10"  (177.8 cm) Weight: 236 lb 1.8 oz (107.1 kg) IBW/kg (Calculated) : 73  Vital Signs: Temp: 98.2 F (36.8 C) (01/27 0726) Temp Source: Axillary (01/27 0726) BP: 113/63 (01/27 0811) Pulse Rate: 66 (01/27 0815)  Labs: Recent Labs    04/06/18 0416  04/07/18 0500 04/07/18 1644 04/08/18 0500  HGB 13.0  --  12.9*  --  11.9*  HCT 43.2  --  41.4  --  39.8  PLT 387  --  388  --  375  APTT 39*  --  46*  --  41*  LABPROT 19.4*  --  25.2*  --  27.6*  INR 1.66  --  2.33  --  2.62  CREATININE 7.15*   < > 10.34* 11.10* 10.34*   < > = values in this interval not displayed.    Assessment: 48 year old male presenting with NV and fever, h/o afib on warfarin PTA. Pharmacy consulted to continue warfarin dosing.   Patient had a supratherapeutic INR on 1/21 - he received FFP in an attempt to lower the INR. On 1/24, the INR was still > 2 , he received 1 mg of Vitamin K in anticipation of needing to place a dialysis catheter. Warfarin was restarted 1/25, prior to that his last dose was on 1/19. His INR went from 1.6 > 2.3 after one dose of warfarin. Warfarin held and INR slightly increased today to 2.62.   Hb trending down; Hct and Plt stable. No bleeding noted.   PTA warfarin dose: 7.5 mg MWF, 5 mg TTSS  Goal of Therapy:  INR 2-3 Monitor platelets by anticoagulation protocol: Yes   Plan:  Give warfarin 2 mg PO tonight to prevent subtherapeutic level  Daily INR Monitor CBC trend and s/sx of bleeding  Carlyon Shadow 04/08/2018 8:28 AM

## 2018-04-09 ENCOUNTER — Inpatient Hospital Stay (HOSPITAL_COMMUNITY): Payer: Medicaid Other

## 2018-04-09 DIAGNOSIS — A419 Sepsis, unspecified organism: Secondary | ICD-10-CM

## 2018-04-09 DIAGNOSIS — R6521 Severe sepsis with septic shock: Secondary | ICD-10-CM

## 2018-04-09 LAB — RENAL FUNCTION PANEL
ANION GAP: 15 (ref 5–15)
Albumin: 2.7 g/dL — ABNORMAL LOW (ref 3.5–5.0)
Albumin: 2.8 g/dL — ABNORMAL LOW (ref 3.5–5.0)
Anion gap: 15 (ref 5–15)
BUN: 52 mg/dL — ABNORMAL HIGH (ref 6–20)
BUN: 45 mg/dL — ABNORMAL HIGH (ref 6–20)
CALCIUM: 9.4 mg/dL (ref 8.9–10.3)
CO2: 23 mmol/L (ref 22–32)
CO2: 19 mmol/L — ABNORMAL LOW (ref 22–32)
Calcium: 9.4 mg/dL (ref 8.9–10.3)
Chloride: 96 mmol/L — ABNORMAL LOW (ref 98–111)
Chloride: 98 mmol/L (ref 98–111)
Creatinine, Ser: 5.01 mg/dL — ABNORMAL HIGH (ref 0.61–1.24)
Creatinine, Ser: 4.53 mg/dL — ABNORMAL HIGH (ref 0.61–1.24)
GFR calc Af Amer: 15 mL/min — ABNORMAL LOW (ref >60)
GFR calc Af Amer: 17 mL/min — ABNORMAL LOW (ref >60)
GFR calc non Af Amer: 13 mL/min — ABNORMAL LOW (ref >60)
GFR calc non Af Amer: 14 mL/min — ABNORMAL LOW (ref >60)
Glucose, Bld: 148 mg/dL — ABNORMAL HIGH (ref 70–99)
Glucose, Bld: 235 mg/dL — ABNORMAL HIGH (ref 70–99)
Phosphorus: 4.9 mg/dL — ABNORMAL HIGH (ref 2.5–4.6)
Phosphorus: 5.6 mg/dL — ABNORMAL HIGH (ref 2.5–4.6)
Potassium: 4.5 mmol/L (ref 3.5–5.1)
Potassium: 5.1 mmol/L (ref 3.5–5.1)
Sodium: 134 mmol/L — ABNORMAL LOW (ref 135–145)
Sodium: 132 mmol/L — ABNORMAL LOW (ref 135–145)

## 2018-04-09 LAB — CBC
HCT: 42.8 % (ref 39.0–52.0)
Hemoglobin: 12.7 g/dL — ABNORMAL LOW (ref 13.0–17.0)
MCH: 28.4 pg (ref 26.0–34.0)
MCHC: 29.7 g/dL — ABNORMAL LOW (ref 30.0–36.0)
MCV: 95.7 fL (ref 80.0–100.0)
Platelets: 384 10*3/uL (ref 150–400)
RBC: 4.47 MIL/uL (ref 4.22–5.81)
RDW: 14.7 % (ref 11.5–15.5)
WBC: 11.2 10*3/uL — ABNORMAL HIGH (ref 4.0–10.5)
nRBC: 0.2 % (ref 0.0–0.2)

## 2018-04-09 LAB — GLUCOSE, CAPILLARY
Glucose-Capillary: 121 mg/dL — ABNORMAL HIGH (ref 70–99)
Glucose-Capillary: 132 mg/dL — ABNORMAL HIGH (ref 70–99)
Glucose-Capillary: 137 mg/dL — ABNORMAL HIGH (ref 70–99)
Glucose-Capillary: 143 mg/dL — ABNORMAL HIGH (ref 70–99)
Glucose-Capillary: 144 mg/dL — ABNORMAL HIGH (ref 70–99)
Glucose-Capillary: 159 mg/dL — ABNORMAL HIGH (ref 70–99)
Glucose-Capillary: 161 mg/dL — ABNORMAL HIGH (ref 70–99)
Glucose-Capillary: 167 mg/dL — ABNORMAL HIGH (ref 70–99)
Glucose-Capillary: 199 mg/dL — ABNORMAL HIGH (ref 70–99)
Glucose-Capillary: 219 mg/dL — ABNORMAL HIGH (ref 70–99)

## 2018-04-09 LAB — MAGNESIUM: Magnesium: 2.8 mg/dL — ABNORMAL HIGH (ref 1.7–2.4)

## 2018-04-09 LAB — PROTIME-INR
INR: 2.42
Prothrombin Time: 26 seconds — ABNORMAL HIGH (ref 11.4–15.2)

## 2018-04-09 LAB — APTT: aPTT: 51 seconds — ABNORMAL HIGH (ref 24–36)

## 2018-04-09 MED ORDER — INSULIN ASPART 100 UNIT/ML ~~LOC~~ SOLN
0.0000 [IU] | SUBCUTANEOUS | Status: DC
  Administered 2018-04-09: 4 [IU] via SUBCUTANEOUS
  Administered 2018-04-09: 7 [IU] via SUBCUTANEOUS
  Administered 2018-04-09: 4 [IU] via SUBCUTANEOUS
  Administered 2018-04-10 (×2): 7 [IU] via SUBCUTANEOUS
  Administered 2018-04-10: 11 [IU] via SUBCUTANEOUS
  Administered 2018-04-10 (×2): 7 [IU] via SUBCUTANEOUS
  Administered 2018-04-10: 11 [IU] via SUBCUTANEOUS
  Administered 2018-04-11 (×2): 4 [IU] via SUBCUTANEOUS

## 2018-04-09 MED ORDER — INSULIN DETEMIR 100 UNIT/ML ~~LOC~~ SOLN
12.0000 [IU] | Freq: Two times a day (BID) | SUBCUTANEOUS | Status: DC
  Administered 2018-04-09 (×2): 12 [IU] via SUBCUTANEOUS
  Filled 2018-04-09 (×2): qty 0.12

## 2018-04-09 MED ORDER — INSULIN ASPART 100 UNIT/ML ~~LOC~~ SOLN: 3.0000 [IU] | SUBCUTANEOUS | Status: DC

## 2018-04-09 MED ORDER — MIDAZOLAM HCL 2 MG/2ML IJ SOLN
INTRAMUSCULAR | Status: AC
  Administered 2018-04-09: 1 mg via INTRAVENOUS
  Filled 2018-04-09: qty 2

## 2018-04-09 MED ORDER — INSULIN ASPART 100 UNIT/ML ~~LOC~~ SOLN: 2.0000 [IU] | SUBCUTANEOUS | Status: DC

## 2018-04-09 MED ORDER — INSULIN DETEMIR 100 UNIT/ML ~~LOC~~ SOLN: 8.0000 [IU] | Freq: Two times a day (BID) | SUBCUTANEOUS | Status: DC

## 2018-04-09 MED ORDER — MIDAZOLAM HCL 2 MG/2ML IJ SOLN
0.5000 mg | INTRAMUSCULAR | Status: DC | PRN
  Administered 2018-04-09: 1 mg via INTRAVENOUS
  Administered 2018-04-09: 2 mg via INTRAVENOUS
  Administered 2018-04-09: 1 mg via INTRAVENOUS
  Administered 2018-04-10 – 2018-04-12 (×10): 2 mg via INTRAVENOUS
  Administered 2018-04-12: 1 mg via INTRAVENOUS
  Administered 2018-04-13: 2 mg via INTRAVENOUS
  Administered 2018-04-16: 1 mg via INTRAVENOUS
  Filled 2018-04-09 (×15): qty 2

## 2018-04-09 MED ORDER — INSULIN DETEMIR 100 UNIT/ML ~~LOC~~ SOLN
12.0000 [IU] | Freq: Two times a day (BID) | SUBCUTANEOUS | Status: DC
  Administered 2018-04-09: 12 [IU] via SUBCUTANEOUS
  Filled 2018-04-09 (×2): qty 0.12

## 2018-04-09 MED ORDER — INSULIN ASPART 100 UNIT/ML ~~LOC~~ SOLN
2.0000 [IU] | SUBCUTANEOUS | Status: DC
  Administered 2018-04-09 – 2018-04-10 (×5): 2 [IU] via SUBCUTANEOUS

## 2018-04-09 MED ORDER — DEXTROSE 10 % IV SOLN
INTRAVENOUS | Status: DC
  Administered 2018-04-09: 06:00:00 via INTRAVENOUS

## 2018-04-09 MED ORDER — PRISMASOL BGK 0/2.5 32-2.5 MEQ/L REPLACEMENT SOLN
Status: DC
  Administered 2018-04-09 – 2018-04-16 (×11): via INTRAVENOUS_CENTRAL
  Filled 2018-04-09 (×15): qty 5000

## 2018-04-09 MED ORDER — INSULIN ASPART 100 UNIT/ML ~~LOC~~ SOLN
1.0000 [IU] | SUBCUTANEOUS | Status: DC
  Administered 2018-04-09: 2 [IU] via SUBCUTANEOUS

## 2018-04-09 NOTE — Progress Notes (Signed)
[facility] PROGRESS NOTE  Assessment/ Plan: Pt is a 48 y.o. yo male with acute respiratory failure due to pneumonia, CHF, EF 25 to 30%, AKI on CKD due to cardiorenal syndrome on CRRT since 1/20.  #Acute kidney injury on CKD stage III/IV: Baseline creatinine around 2.5-2.8, AKI due to ATN versus cardiorenal syndrome.  Patient remains anuric without evidence of renal recovery.  It is hard to assess renal recovery especially since patient is on CRRT.  Plan to continue for now. Pt with abd distention, changed to UF 50 cc an hour.  #Cardiogenic shock, CHF exacerbation: Remains on Levophed.  Changed to UF with CRRT.  Cardiology is following.  #Acute respiratory failure with hypoxia: Metapneumovirus positive, on ventilator support.  #Acute metabolic encephalopathy: Weaning sedation,  Discussed with ICU team.  Subjective: Seen and examined in ICU.  Patient is unresponsive.  On CRRT.  Abdomen distention, x-ray with NG tube in position. Objective Vital signs in last 24 hours: Vitals:   04/09/18 0755 04/09/18 0756 04/09/18 0800 04/09/18 0900  BP: 93/77  93/78 98/79  Pulse: 76  78 75  Resp: '18  18 18  ' Temp:      TempSrc:      SpO2: 91% 92% 92% 92%  Weight:      Height:       Weight change: -2.3 kg  Intake/Output Summary (Last 24 hours) at 04/09/2018 0913 Last data filed at 04/09/2018 0900 Gross per 24 hour  Intake 1799.88 ml  Output 2381 ml  Net -581.12 ml       Labs: Basic Metabolic Panel: Recent Labs  Lab 04/08/18 0500 04/08/18 1502 04/09/18 0426  NA 136 135 134*  K 3.7 4.3 4.5  CL 100 95* 96*  CO2 20* 21* 23  GLUCOSE 186* 163* 148*  BUN 103* 70* 52*  CREATININE 10.34* 6.83* 5.01*  CALCIUM 9.2 9.3 9.4  PHOS 5.1* 5.1* 4.9*   Liver Function Tests: Recent Labs  Lab 04/08/18 0500 04/08/18 1502 04/09/18 0426  ALBUMIN 2.6* 2.8* 2.7*   No results for input(s): LIPASE, AMYLASE in the last 168 hours. No results for input(s): AMMONIA in the  last 168 hours. CBC: Recent Labs  Lab 04/03/18 0347  04/06/18 0416 04/07/18 0500 04/08/18 0500 04/09/18 0426  WBC 5.9  --  15.2* 13.2* 11.3* 11.2*  HGB 11.5*   < > 13.0 12.9* 11.9* 12.7*  HCT 36.8*   < > 43.2 41.4 39.8 42.8  MCV 93.4  --  94.1 93.5 94.1 95.7  PLT 171  --  387 388 375 384   < > = values in this interval not displayed.   Cardiac Enzymes: No results for input(s): CKTOTAL, CKMB, CKMBINDEX, TROPONINI in the last 168 hours. CBG: Recent Labs  Lab 04/09/18 0156 04/09/18 0257 04/09/18 0359 04/09/18 0454 04/09/18 0752  GLUCAP 121* 137* 132* 144* 161*    Iron Studies: No results for input(s): IRON, TIBC, TRANSFERRIN, FERRITIN in the last 72 hours. Studies/Results: Dg Chest Port 1 View  Result Date: 04/08/2018 CLINICAL DATA:  Respiratory failure. EXAM: PORTABLE CHEST 1 VIEW COMPARISON:  04/07/2018 FINDINGS: Endotracheal tube terminates 4 cm above the carina. Enteric tube courses into the upper abdomen with tip not imaged. Right jugular catheter and an ICD remain in place. The cardiac silhouette remains enlarged. Lung volumes remain low. Right greater than left basilar opacities have not significantly changed. No sizable pleural effusion or pneumothorax is identified. IMPRESSION: Unchanged right greater than left basilar lung opacities which may reflect  atelectasis and pneumonia or edema. Electronically Signed   By: Logan Bores M.D.   On: 04/08/2018 07:15   Dg Abd Portable 1v  Result Date: 04/09/2018 CLINICAL DATA:  Abdominal distension EXAM: PORTABLE ABDOMEN - 1 VIEW COMPARISON:  04/07/2018 FINDINGS: Unchanged position of nasogastric tube with side port and tip projecting over the stomach. Study is limited by body habitus. No dilated small bowel identified. IMPRESSION: Unchanged position of nasogastric tube. Electronically Signed   By: Ulyses Jarred M.D.   On: 04/09/2018 01:45   Dg Abd Portable 1v  Result Date: 04/07/2018 CLINICAL DATA:  Check gastric catheter placement  EXAM: PORTABLE ABDOMEN - 1 VIEW COMPARISON:  04/02/2018 FINDINGS: Scattered large and small bowel gas is noted. Gastric catheter is noted within the stomach. No bony abnormality is seen. IMPRESSION: Gastric catheter within the stomach. Electronically Signed   By: Inez Catalina M.D.   On: 04/07/2018 21:32    Medications: Infusions: .  prismasol BGK 4/2.5 400 mL/hr at 04/09/18 0128  .  prismasol BGK 4/2.5 400 mL/hr at 04/09/18 0128  . sodium chloride Stopped (04/05/18 0948)  . dextrose 40 mL/hr at 04/09/18 0700  . HYDROmorphone 1.5 mg/hr (04/09/18 0700)  . insulin Stopped (04/09/18 0526)  . norepinephrine (LEVOPHED) Adult infusion 2 mcg/min (04/09/18 0845)  . prismasol BGK 4/2.5 2,000 mL/hr at 04/09/18 0831  . propofol (DIPRIVAN) infusion Stopped (04/09/18 0449)    Scheduled Medications: . allopurinol  300 mg Per Tube Daily  . amiodarone  200 mg Per Tube Daily  . atorvastatin  40 mg Per Tube q1800  . chlorhexidine gluconate (MEDLINE KIT)  15 mL Mouth Rinse BID  . Chlorhexidine Gluconate Cloth  6 each Topical Daily  . docusate  50 mg Per Tube BID  . feeding supplement (PRO-STAT SUGAR FREE 64)  30 mL Per Tube QID  . feeding supplement (VITAL HIGH PROTEIN)  1,000 mL Per Tube Q24H  . insulin aspart  1-3 Units Subcutaneous Q4H  . insulin detemir  12 Units Subcutaneous Q12H  . mouth rinse  15 mL Mouth Rinse 10 times per day  . nystatin  5 mL Oral QID  . pantoprazole sodium  40 mg Per Tube Daily  . sennosides  5 mL Per Tube BID  . sodium chloride flush  10-40 mL Intracatheter Q12H  . Warfarin - Pharmacist Dosing Inpatient   Does not apply q1800    have reviewed scheduled and prn medications.  Physical Exam: General: Sedated, intubated Heart:RRR, s1s2 nl, no rubs Lungs: Coarse breath sound bilateral, no wheezing Abdomen:soft, mildly distended Extremities:No edema Dialysis Access: Temporary IJ catheter.  Deirdra Heumann Prasad Donnelle Rubey 04/09/2018,9:13 AM  LOS: 12 days

## 2018-04-09 NOTE — Progress Notes (Addendum)
Patient ID: Travis Bryan, male   DOB: Aug 04, 1970, 48 y.o.   MRN: [id number]     Advanced Heart Failure Rounding Note  PCP-Cardiologist: No primary care provider on file.   Subjective:    Started CVVHD 1/20 . Intubated 1/20  Remains on dilaudid drip and NE 3 mcg. Remains on CVVHD keeping even.   Overnight significant abdominal distention noted. TFs stopped. Placed on suction with 600 mL out. ? SBO.   Remains anuric. Off propofol. Alert and following commands this am.   Objective:   Weight Range: 104.8 kg Body mass index is 33.15 kg/m.   Vital Signs:   Temp:  [97.1 F (36.2 C)-98.2 F (36.8 C)] 98.2 F (36.8 C) (01/28 0753) Pulse Rate:  [64-94] 76 (01/28 0755) Resp:  [14-27] 18 (01/28 0755) BP: (80-100)/(56-80) 93/77 (01/28 0755) SpO2:  [84 %-98 %] 92 % (01/28 0756) Arterial Line BP: (83-124)/(53-78) 95/64 (01/28 0700) FiO2 (%):  [60 %] 60 % (01/28 0756) Weight:  [104.8 kg] 104.8 kg (01/28 0500) Last BM Date: 04/09/18  Weight change: Filed Weights   04/07/18 0427 04/08/18 0500 04/09/18 0500  Weight: 101.2 kg 107.1 kg 104.8 kg    Intake/Output:   Intake/Output Summary (Last 24 hours) at 04/09/2018 0827 Last data filed at 04/09/2018 0800 Gross per 24 hour  Intake 1833.67 ml  Output 2466 ml  Net -632.33 ml    Physical Exam   General: Intubated/sedated. Following commands.  HEENT: + ETT Neck: Supple. JVP ~8. Carotids 2+ bilat; no bruits. No thyromegaly or nodule noted. Cor: PMI nondisplaced. RRR, No M/G/R noted Lungs: Diminished basilar sounds Abdomen: Soft, non-tender, non-distended, no HSM. No bruits or masses. +BS  Extremities: No cyanosis, clubbing, or rash. Trace ankle edema. R femoral HD cath.  Neuro: Intubated/sedated   Telemetry   NSR 80-90s, personally reviewed.   Labs    CBC Recent Labs    04/08/18 0500 04/09/18 0426  WBC 11.3* 11.2*  HGB 11.9* 12.7*  HCT 39.8 42.8  MCV 94.1 95.7  PLT 375 657   Basic Metabolic Panel Recent Labs   04/08/18 0500 04/08/18 1502 04/09/18 0426  NA 136 135 134*  K 3.7 4.3 4.5  CL 100 95* 96*  CO2 20* 21* 23  GLUCOSE 186* 163* 148*  BUN 103* 70* 52*  CREATININE 10.34* 6.83* 5.01*  CALCIUM 9.2 9.3 9.4  MG 3.3*  --  2.8*  PHOS 5.1* 5.1* 4.9*   Liver Function Tests Recent Labs    04/08/18 1502 04/09/18 0426  ALBUMIN 2.8* 2.7*   No results for input(s): LIPASE, AMYLASE in the last 72 hours. Cardiac Enzymes No results for input(s): CKTOTAL, CKMB, CKMBINDEX, TROPONINI in the last 72 hours.  BNP: BNP (last 3 results) Recent Labs    02/25/18 1235 03/28/18 1040  BNP 207.2* 683.2*    ProBNP (last 3 results) No results for input(s): PROBNP in the last 8760 hours.   D-Dimer No results for input(s): DDIMER in the last 72 hours. Hemoglobin A1C No results for input(s): HGBA1C in the last 72 hours. Fasting Lipid Panel Recent Labs    04/06/18 1312  TRIG 288*   Thyroid Function Tests No results for input(s): TSH, T4TOTAL, T3FREE, THYROIDAB in the last 72 hours.  Invalid input(s): FREET3  Other results:   Imaging    Dg Abd Portable 1v  Result Date: 04/09/2018 CLINICAL DATA:  Abdominal distension EXAM: PORTABLE ABDOMEN - 1 VIEW COMPARISON:  04/07/2018 FINDINGS: Unchanged position of nasogastric tube with side port and tip  projecting over the stomach. Study is limited by body habitus. No dilated small bowel identified. IMPRESSION: Unchanged position of nasogastric tube. Electronically Signed   By: Ulyses Jarred M.D.   On: 04/09/2018 01:45     Medications:     Scheduled Medications: . allopurinol  300 mg Per Tube Daily  . amiodarone  200 mg Per Tube Daily  . atorvastatin  40 mg Per Tube q1800  . chlorhexidine gluconate (MEDLINE KIT)  15 mL Mouth Rinse BID  . Chlorhexidine Gluconate Cloth  6 each Topical Daily  . docusate  50 mg Per Tube BID  . feeding supplement (PRO-STAT SUGAR FREE 64)  30 mL Per Tube QID  . feeding supplement (VITAL HIGH PROTEIN)  1,000 mL Per  Tube Q24H  . insulin aspart  1-3 Units Subcutaneous Q4H  . insulin detemir  12 Units Subcutaneous Q12H  . mouth rinse  15 mL Mouth Rinse 10 times per day  . nystatin  5 mL Oral QID  . pantoprazole sodium  40 mg Per Tube Daily  . sennosides  5 mL Per Tube BID  . sodium chloride flush  10-40 mL Intracatheter Q12H  . Warfarin - Pharmacist Dosing Inpatient   Does not apply q1800    Infusions: .  prismasol BGK 4/2.5 400 mL/hr at 04/09/18 0128  .  prismasol BGK 4/2.5 400 mL/hr at 04/09/18 0128  . sodium chloride Stopped (04/05/18 0948)  . dextrose 40 mL/hr at 04/09/18 0700  . HYDROmorphone 1.5 mg/hr (04/09/18 0700)  . insulin Stopped (04/09/18 0526)  . norepinephrine (LEVOPHED) Adult infusion 3 mcg/min (04/09/18 0730)  . prismasol BGK 4/2.5 2,000 mL/hr at 04/09/18 0549  . propofol (DIPRIVAN) infusion Stopped (04/09/18 0449)    PRN Medications: sodium chloride, acetaminophen **OR** acetaminophen, albuterol, bisacodyl, diphenhydrAMINE, fentaNYL, heparin, heparin, HYDROmorphone, lip balm, lip balm, midazolam, ondansetron (ZOFRAN) IV **OR** [DISCONTINUED] ondansetron, sodium chloride flush    Assessment/Plan   1. Acute hypoxemic respiratory failure: Suspect bilateral PNA superimposed on viral PNA. He grew metapneumovirus.  However, PCT up to 30 is suggestive of bacterial PNA.   - Remains afebrile. Intubated 1/20 after suspected aspiration event.  - Completed ABX course.  2. Acute on chronic systolic CHF: He has a nonischemic cardiomyopathy. St Jude ICD.  Echo (12/19) with EF 25-30%.  His creatinine rose up to 8.98 from baseline around 2.5-2.8. He was volume overloaded and did not respond to high dose diuretics so CVVH begun. CVVH keeping even  - Remains on NE at 3 mcg. MAPs 80-90s, SBP 100-110s via art line.   - Off Hydralazine/Imdur/amlodipine/coreg.   - He has LBBB with QRS that has widened considerably.  May eventually be candidate for upgrade to CRT-D.  3. 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  Suspect cardiorenal syndrome => ATN.   Started CVVH 1/20 renal following.  - CVVH keeping even. Remains pressor dependent.  4. Atrial fibrillation: Paroxysmal. - Remains in NSR on amio.  - Continue warfarin.  5. H/o VT: On amiodarone 200 mg daily.  - No change.   6. ? SBO - Developed abdominal distention overnight. TFs stopped. Switched to suction with > 600 mL out. Per primary.   He remains critically ill with multiple system organ failure. Prognosis guarded.   Length of Stay: [address]  Annamaria Helling  04/09/2018, 8:27 AM  Advanced Heart Failure Team Pager [phone] (M-F; 7a - 4p)  Please contact [facility] for night-coverage after hours (4p -7a ) and weekends on [email or web address]  Patient seen with PA, agree with the above note.   He remains on CVVH, now with UF 50 cc/hr.  Volume ok.  Slow weaning from vent. ?SBO, tube feeds held.  Norepinephrine weaned to 3.  No changes from my standpoint today.   Loralie Champagne 04/09/2018

## 2018-04-09 NOTE — Progress Notes (Signed)
Dr. Carolin Sicks paged due to potassium trending up. Verbal orders to change post dialysis fluid to 0/2.5 with rate remaining the same.

## 2018-04-09 NOTE — Progress Notes (Signed)
NAME:  Travis Bryan, MRN:  [id number], DOB:  1971/01/26, LOS: 12 ADMISSION DATE:  03/28/2018, CONSULTATION DATE:  03/28/2018 REFERRING MD:  Graciella Freer, CHIEF COMPLAINT:  Respiratory failure.   Brief History   48 year old man present with fever and nausea and vomiting. Increase in weight and dyspnea.Started on BiPAP and antibiotics for suspicion of pneumonia with superimposed decompensated heart failure. PEA arrest on 1/20. Intubated, ROSC achieved after 6 minutes of CPR and Epi x 2.   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 PCCM consulted for persistent hypoxic respiratory failure despite 15 L per minute HFNC and he was transferred to the ICU.   1/20 PEA Arrest (6 minutes), intubated, started on CVVHD  Consults:  CCM  Heart failure  Nephrology   Procedures:  1/20 ETT 1/20 Fem HD Cath   Significant Diagnostic Tests:  None   Micro Data:  1/18 Resp cx - neg (final)  1/17 RVP - metapneumovirus   1/16 blood - negative  1/16 urine culture - neg  MRSA neg   Antimicrobials:  1/17 doxycycline > 1/18  1/17 vanc > 1/18  1/17 cefepime >> dc'd 1/20 1/21 Ceftriaxone >> 1/23  Interim history/subjective:  Alert this morning on dilaudid drip, following commands. Quickly falls back asleep   Objective   Blood pressure 90/70, pulse 79, temperature 97.6 F (36.4 C), temperature source Oral, resp. rate 18, height 5\' 10"  (1.778 m), weight 104.8 kg, SpO2 91 %. CVP:  [8 mmHg] 8 mmHg  Vent Mode: PRVC FiO2 (%):  [60 %] 60 % Set Rate:  [18 bmp] 18 bmp Vt Set:  [580 mL] 580 mL PEEP:  [10 FAO13-08 cmH20] 10 cmH20 Plateau Pressure:  [27 cmH20-36 cmH20] 31 cmH20   Intake/Output Summary (Last 24 hours) at 04/09/2018 0715 Last data filed at 04/09/2018 0700 Gross per  24 hour  Intake 1862.51 ml  Output 2550 ml  Net -687.49 ml   Filed Weights   04/07/18 0427 04/08/18 0500 04/09/18 0500  Weight: 101.2 kg 107.1 kg 104.8 kg    Examination:   General - sedated Eyes - pupils reactive ENT - ETT in place Cardiac - RRR, no m/r/g Chest - CTAB, no wheezing  Abdomen - soft, non tender, + bowel sounds Extremities - trace edema Skin - no rashes Neuro - RASS -1  Resolved Hospital Problem list     Assessment & Plan:   Acute Hypoxic Respiratory Failure: Slow recovery following viral pneumonia complicated by decompensated HF and cardiorenal syndrome. Day 9 on vent, has had difficulty weaning but appears to be improving this morning.  -- Down to FiO2 60 and PEEP 10 today; no desaturation episodes overnight  -- Continue to wean today; goal SpO2 > 88% -- SBT once stable on minimal vent settings -- CVVHD per nephro; net - 50 cc/hr -- Continue Dilaudid gtt for RASS goal 0 to -1  Acute on chronic systolic CHF with cardiogenic shock PEA arrest from respiratory failure 1/20 Hx of NICM s/p ICD, A fib/flutter -- Continue home amiodarone, remains in NSR -- Warfarin per pharmacy  -- Levophed to keep MAP  > 65 -- Holding HF meds with hypotension   Acute Renal Failure: Cardiorenal / ATN. No sign of recovery thus far, remains anuric  -- CVVHD per nephro  -- pulling - 50  cc/hr today   DM: Improved on insulin drip, down to rate of 1.8 with CBGs at goal. -- change to Sub q lantus and SSI today   Acute metabolic encephalopathy: -- RASS goal 0 to -1 -- Continue dilaudid gtt  Best practice:  Diet: Tube feeds DVT prophylaxis: continue coumadin  GI prophylaxis: protonix Glucose: Insulin drip -> sub q today  Mobility: Bedrest  Code Status: FULL Family Communication: no family at bedside  Velna Ochs, M.D. - PGY3 Pager: [phone] 04/09/2018, 7:15 AM

## 2018-04-09 NOTE — Progress Notes (Addendum)
[facility] for Warfarin/Heparin Indication: atrial fibrillation  Patient Measurements: Height: 5\' 10"  (177.8 cm) Weight: 231 lb 0.7 oz (104.8 kg) IBW/kg (Calculated) : 73  Vital Signs: Temp: 98.2 F (36.8 C) (01/28 0753) Temp Source: Oral (01/28 0753) BP: 91/67 (01/28 1000) Pulse Rate: 88 (01/28 1000)  Labs: Recent Labs    04/07/18 0500  04/08/18 0500 04/08/18 1502 04/09/18 0426  HGB 12.9*  --  11.9*  --  12.7*  HCT 41.4  --  39.8  --  42.8  PLT 388  --  375  --  384  APTT 46*  --  41*  --  51*  LABPROT 25.2*  --  27.6*  --  26.0*  INR 2.33  --  2.62  --  2.42  CREATININE 10.34*   < > 10.34* 6.83* 5.01*   < > = values in this interval not displayed.    Assessment: 48 year old male presenting with NV and fever, h/o afib on warfarin PTA. Pharmacy consulted to continue warfarin dosing.   Patient had a supratherapeutic INR on 1/21 - he received FFP in an attempt to lower the INR. On 1/24, the INR was still > 2 , he received 1 mg of Vitamin K in anticipation of needing to place a dialysis catheter. Warfarin was restarted 1/25, prior to that his last dose was on 1/19. Warfarin held last night due to plans for replacing HD cath. INR remains WNL and CCM recommends d/c warfarin and switch to heparin gtt when INR <2.  H/H and Plt stable. No bleeding noted.   PTA warfarin dose: 7.5 mg MWF, 5 mg TTSS  Goal of Therapy:  INR 2-3 Monitor platelets by anticoagulation protocol: Yes   Plan:  Discontinue warfarin  Daily INR  And start heparin when INR < 2 Monitor CBC trend and s/sx of bleeding  Carlyon Shadow 04/09/2018 10:32 AM   Levester Fresh, PharmD, BCPS, BCCCP Clinical Pharmacist [phone]  Please check AMION for all [facility] numbers  04/09/2018 3:02 PM

## 2018-04-09 NOTE — Progress Notes (Signed)
[facility] Progress Note Patient Name: Travis Bryan DOB: December 23, 1970 MRN: [id number]   Date of Service  04/09/2018  HPI/Events of Note  Abdominal distention - Nurse has turned off enteral nutrition and place gastric tube to LIS. Abdominal Xray - negative. Currently on an insulin IV infusion.   eICU Interventions  Continue present management. Continue to monitor blood glucose.      Intervention Category Major Interventions: Other:  Geet Hosking Cornelia Copa 04/09/2018, 2:09 AM

## 2018-04-09 NOTE — Progress Notes (Addendum)
Stomach distended and taut, progressively getting worse over shift. Tube feed shut off, OG hooked to intermittent suction with 68ml out immediately. KUB ordered, will continue to monitor.

## 2018-04-10 DIAGNOSIS — I1310 Hypertensive heart and chronic kidney disease without heart failure, with stage 1 through stage 4 chronic kidney disease, or unspecified chronic kidney disease: Secondary | ICD-10-CM

## 2018-04-10 DIAGNOSIS — J123 Human metapneumovirus pneumonia: Secondary | ICD-10-CM

## 2018-04-10 LAB — POCT I-STAT 7, (LYTES, BLD GAS, ICA,H+H)
Acid-base deficit: 2 mmol/L (ref 0.0–2.0)
Acid-base deficit: 2 mmol/L (ref 0.0–2.0)
Acid-base deficit: 3 mmol/L — ABNORMAL HIGH (ref 0.0–2.0)
Bicarbonate: 23.9 mmol/L (ref 20.0–28.0)
Bicarbonate: 25.2 mmol/L (ref 20.0–28.0)
Bicarbonate: 24.1 mmol/L (ref 20.0–28.0)
Calcium, Ion: 1.16 mmol/L (ref 1.15–1.40)
Calcium, Ion: 1.22 mmol/L (ref 1.15–1.40)
Calcium, Ion: 1.19 mmol/L (ref 1.15–1.40)
HCT: 41 % (ref 39.0–52.0)
HCT: 42 % (ref 39.0–52.0)
HCT: 42 % (ref 39.0–52.0)
Hemoglobin: 13.9 g/dL (ref 13.0–17.0)
Hemoglobin: 14.3 g/dL (ref 13.0–17.0)
Hemoglobin: 14.3 g/dL (ref 13.0–17.0)
O2 Saturation: 95 %
O2 Saturation: 88 %
O2 Saturation: 86 %
Patient temperature: 98
Patient temperature: 98
Potassium: 4.7 mmol/L (ref 3.5–5.1)
Potassium: 4.2 mmol/L (ref 3.5–5.1)
Potassium: 4.7 mmol/L (ref 3.5–5.1)
Sodium: 134 mmol/L — ABNORMAL LOW (ref 135–145)
Sodium: 134 mmol/L — ABNORMAL LOW (ref 135–145)
Sodium: 134 mmol/L — ABNORMAL LOW (ref 135–145)
TCO2: 25 mmol/L (ref 22–32)
TCO2: 27 mmol/L (ref 22–32)
TCO2: 26 mmol/L (ref 22–32)
pCO2 arterial: 43.7 mmHg (ref 32.0–48.0)
pCO2 arterial: 49.9 mmHg — ABNORMAL HIGH (ref 32.0–48.0)
pCO2 arterial: 48.5 mmHg — ABNORMAL HIGH (ref 32.0–48.0)
pH, Arterial: 7.345 — ABNORMAL LOW (ref 7.350–7.450)
pH, Arterial: 7.312 — ABNORMAL LOW (ref 7.350–7.450)
pH, Arterial: 7.302 — ABNORMAL LOW (ref 7.350–7.450)
pO2, Arterial: 76 mmHg — ABNORMAL LOW (ref 83.0–108.0)
pO2, Arterial: 60 mmHg — ABNORMAL LOW (ref 83.0–108.0)
pO2, Arterial: 57 mmHg — ABNORMAL LOW (ref 83.0–108.0)

## 2018-04-10 LAB — RENAL FUNCTION PANEL
ANION GAP: 13 (ref 5–15)
Albumin: 2.7 g/dL — ABNORMAL LOW (ref 3.5–5.0)
Albumin: 2.7 g/dL — ABNORMAL LOW (ref 3.5–5.0)
Anion gap: 14 (ref 5–15)
BUN: 47 mg/dL — ABNORMAL HIGH (ref 6–20)
BUN: 45 mg/dL — AB (ref 6–20)
CALCIUM: 9.2 mg/dL (ref 8.9–10.3)
CO2: 21 mmol/L — ABNORMAL LOW (ref 22–32)
CO2: 21 mmol/L — ABNORMAL LOW (ref 22–32)
Calcium: 9.4 mg/dL (ref 8.9–10.3)
Chloride: 97 mmol/L — ABNORMAL LOW (ref 98–111)
Chloride: 95 mmol/L — ABNORMAL LOW (ref 98–111)
Creatinine, Ser: 4.32 mg/dL — ABNORMAL HIGH (ref 0.61–1.24)
Creatinine, Ser: 3.96 mg/dL — ABNORMAL HIGH (ref 0.61–1.24)
GFR calc Af Amer: 18 mL/min — ABNORMAL LOW (ref >60)
GFR calc Af Amer: 20 mL/min — ABNORMAL LOW (ref >60)
GFR calc non Af Amer: 15 mL/min — ABNORMAL LOW (ref >60)
GFR calc non Af Amer: 17 mL/min — ABNORMAL LOW (ref >60)
Glucose, Bld: 262 mg/dL — ABNORMAL HIGH (ref 70–99)
Glucose, Bld: 279 mg/dL — ABNORMAL HIGH (ref 70–99)
Phosphorus: 4.7 mg/dL — ABNORMAL HIGH (ref 2.5–4.6)
Phosphorus: 4.9 mg/dL — ABNORMAL HIGH (ref 2.5–4.6)
Potassium: 4.7 mmol/L (ref 3.5–5.1)
Potassium: 4.4 mmol/L (ref 3.5–5.1)
SODIUM: 130 mmol/L — AB (ref 135–145)
Sodium: 131 mmol/L — ABNORMAL LOW (ref 135–145)

## 2018-04-10 LAB — GLUCOSE, CAPILLARY
Glucose-Capillary: 182 mg/dL — ABNORMAL HIGH (ref 70–99)
Glucose-Capillary: 202 mg/dL — ABNORMAL HIGH (ref 70–99)
Glucose-Capillary: 206 mg/dL — ABNORMAL HIGH (ref 70–99)
Glucose-Capillary: 216 mg/dL — ABNORMAL HIGH (ref 70–99)
Glucose-Capillary: 239 mg/dL — ABNORMAL HIGH (ref 70–99)
Glucose-Capillary: 263 mg/dL — ABNORMAL HIGH (ref 70–99)
Glucose-Capillary: 276 mg/dL — ABNORMAL HIGH (ref 70–99)

## 2018-04-10 LAB — CBC
HCT: 41.4 % (ref 39.0–52.0)
Hemoglobin: 12.8 g/dL — ABNORMAL LOW (ref 13.0–17.0)
MCH: 29.6 pg (ref 26.0–34.0)
MCHC: 30.9 g/dL (ref 30.0–36.0)
MCV: 95.6 fL (ref 80.0–100.0)
PLATELETS: 413 10*3/uL — AB (ref 150–400)
RBC: 4.33 MIL/uL (ref 4.22–5.81)
RDW: 14.9 % (ref 11.5–15.5)
WBC: 13.2 10*3/uL — ABNORMAL HIGH (ref 4.0–10.5)
nRBC: 0.7 % — ABNORMAL HIGH (ref 0.0–0.2)

## 2018-04-10 LAB — PROTIME-INR
INR: 2.43
Prothrombin Time: 26.1 seconds — ABNORMAL HIGH (ref 11.4–15.2)

## 2018-04-10 LAB — APTT: aPTT: 54 seconds — ABNORMAL HIGH (ref 24–36)

## 2018-04-10 LAB — MAGNESIUM: Magnesium: 2.9 mg/dL — ABNORMAL HIGH (ref 1.7–2.4)

## 2018-04-10 MED ORDER — INSULIN ASPART 100 UNIT/ML ~~LOC~~ SOLN
4.0000 [IU] | SUBCUTANEOUS | Status: DC
  Administered 2018-04-10 – 2018-04-11 (×5): 4 [IU] via SUBCUTANEOUS

## 2018-04-10 MED ORDER — ALTEPLASE 2 MG IJ SOLR
4.0000 mg | Freq: Once | INTRAMUSCULAR | Status: AC
  Administered 2018-04-18: 4 mg
  Filled 2018-04-10: qty 4

## 2018-04-10 MED ORDER — INSULIN DETEMIR 100 UNIT/ML ~~LOC~~ SOLN
15.0000 [IU] | Freq: Two times a day (BID) | SUBCUTANEOUS | Status: DC
  Administered 2018-04-10 (×2): 15 [IU] via SUBCUTANEOUS
  Filled 2018-04-10 (×3): qty 0.15

## 2018-04-10 NOTE — Progress Notes (Signed)
Vent changes made per DR.Jeong.

## 2018-04-10 NOTE — Progress Notes (Addendum)
NAME:  Travis Bryan, MRN:  [id number], DOB:  Sep 19, 1970, LOS: 31 ADMISSION DATE:  03/28/2018, CONSULTATION DATE:  03/28/2018 REFERRING MD:  Graciella Freer, CHIEF COMPLAINT:  Respiratory failure.   Brief History   48 year old man present with fever and nausea and vomiting. Increase in weight and dyspnea.Started on BiPAP and antibiotics for suspicion of pneumonia with superimposed decompensated heart failure. PEA arrest on 1/20. Intubated, ROSC achieved after 6 minutes of CPR and Epi x 2.   Past Medical History  Hypertension, hyperlipidemia, Type 2 Diabetes mellitus, Chronic combined heart failure (last echo 02/2018 with EF25-30%) with AICD, CKD stage III, paroxysmal atrial fibrillation on coumadin, Gout  Significant Hospital Events   1/17 admission to [facility]. PCCM consulted for persistent hypoxic respiratory failure despite 15 L per minute HFNC and he was transferred to the ICU.   1/20 PEA Arrest (6 minutes), intubated, started on CVVHD  Consults:  CCM  Heart failure  Nephrology   Procedures:  1/20 ETT 1/20 Fem HD Cath   Significant Diagnostic Tests:  None   Micro Data:  1/18 Resp cx - neg (final)  1/17 RVP - metapneumovirus   1/16 blood - negative  1/16 urine culture - neg  MRSA neg   Antimicrobials:  1/17 doxycycline > 1/18  1/17 vanc > 1/18  1/17 cefepime >> dc'd 1/20 1/21 Ceftriaxone >> 1/23  Interim history/subjective:  Alert this morning on dilaudid drip, following commands. Quickly falls back asleep   Objective   Blood pressure (!) 80/62, pulse 68, temperature 99 F (37.2 C), temperature source Oral, resp. rate 18, height 5\' 10"  (1.778 m), weight 104 kg, SpO2 98 %.    Vent Mode: PRVC FiO2 (%):  [60 %] 60 % Set Rate:  [18 bmp] 18 bmp Vt Set:  [580 mL] 580 mL PEEP:  [8 cmH20-10 cmH20] 8 cmH20 Plateau Pressure:  [22 cmH20-32 cmH20] 27 cmH20   Intake/Output Summary (Last 24 hours) at 04/10/2018 0739 Last data filed at 04/10/2018 0700 Gross per 24 hour  Intake  1354.97 ml  Output 2382 ml  Net -1027.03 ml   Filed Weights   04/08/18 0500 04/09/18 0500 04/10/18 0457  Weight: 107.1 kg 104.8 kg 104 kg    Examination:   General - sedated Eyes - pupils reactive ENT - ETT in place Cardiac - RRR, no m/r/g Chest - CTAB, no wheezing  Abdomen - soft, non tender, + bowel sounds Extremities - trace edema Skin - no rashes Neuro - RASS -1  Resolved Hospital Problem list     Assessment & Plan:   Acute Hypoxic Respiratory Failure: Slow recovery following viral pneumonia complicated by decompensated HF and cardiorenal syndrome. Day 9 on vent, has had difficulty weaning but appears to be improving this morning.  -- Weaning PEEP and FiO2 as able, goal O2 88% or above -- SBT once stable on minimal vent settings -- CVVHD per nephro; net - 50 cc/hr -- Continue Dilaudid gtt for RASS goal 0 to -1  Acute on chronic systolic CHF with cardiogenic shock PEA arrest from respiratory failure 1/20 Hx of NICM s/p ICD, A fib/flutter -- Continue home amiodarone, remains in NSR -- Stop warfarin; start heparin gtt once INR < 2.0 -- Levophed to keep MAP  > 65 -- Holding HF meds with hypotension   Acute Renal Failure: Cardiorenal / ATN. No sign of recovery thus far, remains anuric  -- CVVHD per nephro  -- pulling - 50 cc/hr today   DM: Off insulin drip, hyperglycemic,  will increase regimen  -- Levemir 15 BID -- Novolog 4 untis q4h tube feed coverage -- SSI-resistant J7T   Acute metabolic encephalopathy: -- RASS goal 0 to -1 -- Continue dilaudid gtt  Best practice:  Diet: Tube feeds DVT prophylaxis: continue coumadin  GI prophylaxis: protonix Glucose: Levemir, novolog, and SSI   Mobility: Bedrest  Code Status: FULL Family Communication: no family at bedside  Velna Ochs, M.D. - PGY3 Pager: [phone] 04/10/2018, 7:39 AM

## 2018-04-10 NOTE — Progress Notes (Signed)
Nutrition Follow-up  DOCUMENTATION CODES:   Obesity unspecified  INTERVENTION:   Continue TF via OGT:  Vital High Protein at 45 ml/h with Pro-stat 30 ml QID to provide 1480 kcal, 155 gm protein, 903 ml free water daily.  NUTRITION DIAGNOSIS:   Inadequate oral intake related to inability to eat as evidenced by NPO status.  Ongoing  GOAL:   Provide needs based on ASPEN/SCCM guidelines  Met with TF  MONITOR:   Vent status, TF tolerance, Labs, I & O's  ASSESSMENT:   48 yo male with PMH of CHF, NICM, HLD, HTN, obesity, AICD, DM-2, and A fib who was admitted with PNA with decompensated HF. S/P PEA arrest on 1/20, requiring CPR x 6 minutes and intubation.  Remains on CRRT.  Patient remains intubated on ventilator support MV: 9.9 L/min Temp (24hrs), Avg:98.6 F (37 C), Min:98.1 F (36.7 C), Max:99 F (37.2 C)   Labs reviewed. Sodium 131 (L), phosphorus 4.7 (H), magnesium 2.9 (H) CBG's: [phone] Medications reviewed and include colace, novolog, levemir.  Patient is currently receiving Vital High Protein via OGT at 45 ml/h (1080 ml/day) with Prostat 30 ml QID to provide 1480 kcals, 155 gm protein, 903 ml free water daily. Tolerating TF well at this time to meet nutrition needs.   Diet Order:   Diet Order            Diet NPO time specified  Diet effective now              EDUCATION NEEDS:   No education needs have been identified at this time  Skin:  Skin Assessment: Reviewed RN Assessment  Last BM:  1/28  Height:   Ht Readings from Last 1 Encounters:  03/28/18 '5\' 10"'  (1.778 m)    Weight:   Wt Readings from Last 1 Encounters:  04/10/18 104 kg    Ideal Body Weight:  75.5 kg  BMI:  Body mass index is 32.9 kg/m.  Estimated Nutritional Needs:   Kcal:  1220-1550  Protein:  151 gm  Fluid:  2 L    Molli Barrows, RD, LDN, [place] Pager [phone] After Hours Pager [phone]

## 2018-04-10 NOTE — Progress Notes (Signed)
[facility] NEPHROLOGY PROGRESS NOTE  Assessment/ Plan: Pt is a 48 y.o. yo male with acute respiratory failure due to pneumonia, CHF, EF 25 to 30%, AKI on CKD due to cardiorenal syndrome on CRRT since 1/20.  #Acute kidney injury on CKD stage III/IV: Baseline creatinine around 2.5-2.8, AKI due to ATN versus cardiorenal syndrome.  Patient remains anuric without evidence of renal recovery.  It is hard to assess renal recovery especially since patient is on CRRT. Continue CRRT today with goal UF not more than 50 cc/hr.  -changed postfilter K to 0 bath because of uptrending potassium level, now improving.  -on levo 3-4 mcg  #Cardiogenic shock, CHF exacerbation: Remains on Levophed.  Cardiology is following.  #Acute respiratory failure with hypoxia: Metapneumovirus positive, remains on  ventilator support.PCCM following.  #Acute metabolic encephalopathy: Weaning sedation,  Subjective: Seen and examined in ICU.  Un-resposive, BP marginal on low dose levophed.  Objective Vital signs in last 24 hours: Vitals:   04/10/18 0600 04/10/18 0700 04/10/18 0730 04/10/18 0800  BP: 93/76 (!) 80/62 (!) 80/62 92/77  Pulse: 77 68 74 71  Resp: '18 18 16 18  ' Temp:    98.6 F (37 C)  TempSrc:    Oral  SpO2: 93% 98% 97% 96%  Weight:      Height:       Weight change: -0.8 kg  Intake/Output Summary (Last 24 hours) at 04/10/2018 0853 Last data filed at 04/10/2018 0800 Gross per 24 hour  Intake 1408.18 ml  Output 2501 ml  Net -1092.82 ml       Labs: Basic Metabolic Panel: Recent Labs  Lab 04/09/18 0426 04/09/18 1653 04/10/18 0408 04/10/18 0428  NA 134* 132* 134* 131*  K 4.5 5.1 4.7 4.7  CL 96* 98  --  97*  CO2 23 19*  --  21*  GLUCOSE 148* 235*  --  262*  BUN 52* 45*  --  47*  CREATININE 5.01* 4.53*  --  4.32*  CALCIUM 9.4 9.4  --  9.2  PHOS 4.9* 5.6*  --  4.7*   Liver Function Tests: Recent Labs  Lab 04/09/18 0426 04/09/18 1653 04/10/18 0428  ALBUMIN 2.7* 2.8* 2.7*    No results for input(s): LIPASE, AMYLASE in the last 168 hours. No results for input(s): AMMONIA in the last 168 hours. CBC: Recent Labs  Lab 04/06/18 0416 04/07/18 0500 04/08/18 0500 04/09/18 0426 04/10/18 0408 04/10/18 0428  WBC 15.2* 13.2* 11.3* 11.2*  --  13.2*  HGB 13.0 12.9* 11.9* 12.7* 13.9 12.8*  HCT 43.2 41.4 39.8 42.8 41.0 41.4  MCV 94.1 93.5 94.1 95.7  --  95.6  PLT 387 388 375 384  --  413*   Cardiac Enzymes: No results for input(s): CKTOTAL, CKMB, CKMBINDEX, TROPONINI in the last 168 hours. CBG: Recent Labs  Lab 04/09/18 1540 04/09/18 1956 04/10/18 0016 04/10/18 0406 04/10/18 0816  GLUCAP 167* 199* 206* 216* 239*    Iron Studies: No results for input(s): IRON, TIBC, TRANSFERRIN, FERRITIN in the last 72 hours. Studies/Results: Dg Abd Portable 1v  Result Date: 04/09/2018 CLINICAL DATA:  Abdominal distension EXAM: PORTABLE ABDOMEN - 1 VIEW COMPARISON:  04/07/2018 FINDINGS: Unchanged position of nasogastric tube with side port and tip projecting over the stomach. Study is limited by body habitus. No dilated small bowel identified. IMPRESSION: Unchanged position of nasogastric tube. Electronically Signed   By: Ulyses Jarred M.D.   On: 04/09/2018 01:45    Medications: Infusions: .  prismasol BGK 4/2.5  400 mL/hr at 04/10/18 0306  . sodium chloride Stopped (04/05/18 0948)  . HYDROmorphone 2 mg/hr (04/10/18 0700)  . norepinephrine (LEVOPHED) Adult infusion 5 mcg/min (04/10/18 0817)  . prismasol BGK 0/2.5 400 mL/hr at 04/09/18 2331  . prismasol BGK 4/2.5 2,000 mL/hr at 04/10/18 0808    Scheduled Medications: . allopurinol  300 mg Per Tube Daily  . amiodarone  200 mg Per Tube Daily  . atorvastatin  40 mg Per Tube q1800  . chlorhexidine gluconate (MEDLINE KIT)  15 mL Mouth Rinse BID  . Chlorhexidine Gluconate Cloth  6 each Topical Daily  . docusate  50 mg Per Tube BID  . feeding supplement (PRO-STAT SUGAR FREE 64)  30 mL Per Tube QID  . feeding supplement  (VITAL HIGH PROTEIN)  1,000 mL Per Tube Q24H  . insulin aspart  0-20 Units Subcutaneous Q4H  . insulin aspart  4 Units Subcutaneous Q4H  . insulin detemir  15 Units Subcutaneous BID  . mouth rinse  15 mL Mouth Rinse 10 times per day  . nystatin  5 mL Oral QID  . pantoprazole sodium  40 mg Per Tube Daily  . sennosides  5 mL Per Tube BID  . sodium chloride flush  10-40 mL Intracatheter Q12H    have reviewed scheduled and prn medications.  Physical Exam: unchanged General: Sedated, intubated Heart:RRR, s1s2 nl, no rubs Lungs: Coarse breath sound bilateral, no wheezing Abdomen:soft, mildly distended Extremities:No edema Dialysis Access: Temporary IJ catheter. Site looks clean  Kamaljit Hizer [employer] 04/10/2018,8:53 AM  LOS: 13 days

## 2018-04-10 NOTE — Progress Notes (Addendum)
Patient ID: Travis Bryan, male   DOB: Dec 04, 1970, 48 y.o.   MRN: [id number]     Advanced Heart Failure Rounding Note  PCP-Cardiologist: No primary care provider on file.   Subjective:    Started CVVHD 1/20 . Intubated 1/20  Attempting to wean Vent. FiO2 at 50% with 8 PEEP.   Remains on Dilaudid Drip and NE at 5. CVVHD with goal of 50 cc/hr. RASS goal 0 to -1. Stirs to voice and follows commands currently.    Objective:   Weight Range: 104 kg Body mass index is 32.9 kg/m.   Vital Signs:   Temp:  [98.2 F (36.8 C)-99 F (37.2 C)] 98.6 F (37 C) (01/29 0800) Pulse Rate:  [50-89] 71 (01/29 0800) Resp:  [16-22] 18 (01/29 0800) BP: (80-113)/(49-90) 92/77 (01/29 0800) SpO2:  [90 %-99 %] 96 % (01/29 0800) Arterial Line BP: (79-109)/(45-65) 104/59 (01/29 0800) FiO2 (%):  [60 %] 60 % (01/29 0800) Weight:  [104 kg] 104 kg (01/29 0457) Last BM Date: 04/09/18  Weight change: Filed Weights   04/08/18 0500 04/09/18 0500 04/10/18 0457  Weight: 107.1 kg 104.8 kg 104 kg    Intake/Output:   Intake/Output Summary (Last 24 hours) at 04/10/2018 0844 Last data filed at 04/10/2018 0800 Gross per 24 hour  Intake 1308.55 ml  Output 2501 ml  Net -1192.45 ml    Physical Exam   General: Intubated/sedated. Follows commands.  HEENT: + ETT Neck: Supple. JVP ~8. Carotids 2+ bilat; no bruits. No thyromegaly or nodule noted. Cor: PMI nondisplaced. RRR, No M/G/R noted Lungs: Diminished basilar sounds Abdomen: Soft, non-tender, non-distended, no HSM. No bruits or masses. +BS  Extremities: No cyanosis, clubbing, or rash. Trace ankle edema.  Neuro: Intubated/sedated   Telemetry   NSR 70-80s, personally reviewed.   Labs    CBC Recent Labs    04/09/18 0426 04/10/18 0408 04/10/18 0428  WBC 11.2*  --  13.2*  HGB 12.7* 13.9 12.8*  HCT 42.8 41.0 41.4  MCV 95.7  --  95.6  PLT 384  --  220*   Basic Metabolic Panel Recent Labs    04/09/18 0426 04/09/18 1653 04/10/18 0408  04/10/18 0428  NA 134* 132* 134* 131*  K 4.5 5.1 4.7 4.7  CL 96* 98  --  97*  CO2 23 19*  --  21*  GLUCOSE 148* 235*  --  262*  BUN 52* 45*  --  47*  CREATININE 5.01* 4.53*  --  4.32*  CALCIUM 9.4 9.4  --  9.2  MG 2.8*  --   --  2.9*  PHOS 4.9* 5.6*  --  4.7*   Liver Function Tests Recent Labs    04/09/18 1653 04/10/18 0428  ALBUMIN 2.8* 2.7*   No results for input(s): LIPASE, AMYLASE in the last 72 hours. Cardiac Enzymes No results for input(s): CKTOTAL, CKMB, CKMBINDEX, TROPONINI in the last 72 hours.  BNP: BNP (last 3 results) Recent Labs    02/25/18 1235 03/28/18 1040  BNP 207.2* 683.2*    ProBNP (last 3 results) No results for input(s): PROBNP in the last 8760 hours.   D-Dimer No results for input(s): DDIMER in the last 72 hours. Hemoglobin A1C No results for input(s): HGBA1C in the last 72 hours. Fasting Lipid Panel No results for input(s): CHOL, HDL, LDLCALC, TRIG, CHOLHDL, LDLDIRECT in the last 72 hours. Thyroid Function Tests No results for input(s): TSH, T4TOTAL, T3FREE, THYROIDAB in the last 72 hours.  Invalid input(s): FREET3  Other results:  Imaging    No results found.   Medications:     Scheduled Medications: . allopurinol  300 mg Per Tube Daily  . amiodarone  200 mg Per Tube Daily  . atorvastatin  40 mg Per Tube q1800  . chlorhexidine gluconate (MEDLINE KIT)  15 mL Mouth Rinse BID  . Chlorhexidine Gluconate Cloth  6 each Topical Daily  . docusate  50 mg Per Tube BID  . feeding supplement (PRO-STAT SUGAR FREE 64)  30 mL Per Tube QID  . feeding supplement (VITAL HIGH PROTEIN)  1,000 mL Per Tube Q24H  . insulin aspart  0-20 Units Subcutaneous Q4H  . insulin aspart  4 Units Subcutaneous Q4H  . insulin detemir  15 Units Subcutaneous BID  . mouth rinse  15 mL Mouth Rinse 10 times per day  . nystatin  5 mL Oral QID  . pantoprazole sodium  40 mg Per Tube Daily  . sennosides  5 mL Per Tube BID  . sodium chloride flush  10-40 mL  Intracatheter Q12H    Infusions: .  prismasol BGK 4/2.5 400 mL/hr at 04/10/18 0306  . sodium chloride Stopped (04/05/18 0948)  . HYDROmorphone 2 mg/hr (04/10/18 0700)  . norepinephrine (LEVOPHED) Adult infusion 5 mcg/min (04/10/18 0817)  . prismasol BGK 0/2.5 400 mL/hr at 04/09/18 2331  . prismasol BGK 4/2.5 2,000 mL/hr at 04/10/18 0808    PRN Medications: sodium chloride, acetaminophen **OR** acetaminophen, albuterol, bisacodyl, diphenhydrAMINE, fentaNYL, heparin, heparin, HYDROmorphone, lip balm, lip balm, midazolam, ondansetron (ZOFRAN) IV **OR** [DISCONTINUED] ondansetron, sodium chloride flush    Assessment/Plan   1. Acute hypoxemic respiratory failure: Suspect bilateral PNA superimposed on viral PNA. He grew metapneumovirus.  However, PCT up to 30 is suggestive of bacterial PNA.   - Remains afebrile.  Intubated 1/20 after suspected aspiration event.  - Completed ABX course. Per CCM.  2. Acute on chronic systolic CHF: He has a nonischemic cardiomyopathy. [employer] ICD.  Echo (12/19) with EF 25-30%.  His creatinine rose up to 8.98 from baseline around 2.5-2.8. He was volume overloaded and did not respond to high dose diuretics so CVVH begun. CVVH keeping even  - Remains on NE 5 . MAPs 80-90s. SBPs 100-110s via art line.  - Off Hydralazine/Imdur/amlodipine/coreg.   - He has LBBB with QRS that has widened considerably.  May eventually be candidate for upgrade to CRT-D.  3. AKI: In setting of CKD stage IV.  Suspect cardiorenal syndrome => ATN.   Started CVVH 1/20 renal following.  - CVVH with 50cc/hr off.  4. Atrial fibrillation: Paroxysmal. - remains in NSR on amiodarone.  - Continue warfarin.  5. H/o VT: On amiodarone 200 mg daily.  - Quiescent.    6. ? SBO - Developed abdominal distention overnight. TFs stopped. Switched to suction with > 600 mL out. Per primary.  - Improved.   Length of Stay: [name]  Annamaria Helling  04/10/2018, 8:44 AM  Advanced Heart Failure  Team Pager [phone] (M-F; 7a - 4p)  Please contact [facility] for night-coverage after hours (4p -7a ) and weekends on [email or web address]  Patient seen with PA, agree with the above note.   He remains on CVVH, now with UF 50 cc/hr.  Anuric.  Volume ok.  Slow weaning from vent. Norepinephrine remains at low dose.  No changes from my standpoint today.   We will follow at a distance for now.  Please call with questions/clinical changes.   Loralie Champagne 04/10/2018 9:53 AM

## 2018-04-11 ENCOUNTER — Inpatient Hospital Stay (HOSPITAL_COMMUNITY): Payer: Medicaid Other

## 2018-04-11 DIAGNOSIS — J80 Acute respiratory distress syndrome: Secondary | ICD-10-CM

## 2018-04-11 LAB — RENAL FUNCTION PANEL
ALBUMIN: 2.7 g/dL — AB (ref 3.5–5.0)
Albumin: 2.7 g/dL — ABNORMAL LOW (ref 3.5–5.0)
Anion gap: 15 (ref 5–15)
Anion gap: 16 — ABNORMAL HIGH (ref 5–15)
BUN: 46 mg/dL — AB (ref 6–20)
BUN: 48 mg/dL — ABNORMAL HIGH (ref 6–20)
CO2: 22 mmol/L (ref 22–32)
CO2: 21 mmol/L — ABNORMAL LOW (ref 22–32)
Calcium: 9.2 mg/dL (ref 8.9–10.3)
Calcium: 9.4 mg/dL (ref 8.9–10.3)
Chloride: 96 mmol/L — ABNORMAL LOW (ref 98–111)
Chloride: 95 mmol/L — ABNORMAL LOW (ref 98–111)
Creatinine, Ser: 3.5 mg/dL — ABNORMAL HIGH (ref 0.61–1.24)
Creatinine, Ser: 4.37 mg/dL — ABNORMAL HIGH (ref 0.61–1.24)
GFR calc Af Amer: 23 mL/min — ABNORMAL LOW (ref >60)
GFR calc Af Amer: 17 mL/min — ABNORMAL LOW (ref >60)
GFR calc non Af Amer: 20 mL/min — ABNORMAL LOW (ref >60)
GFR calc non Af Amer: 15 mL/min — ABNORMAL LOW (ref >60)
Glucose, Bld: 194 mg/dL — ABNORMAL HIGH (ref 70–99)
Glucose, Bld: 208 mg/dL — ABNORMAL HIGH (ref 70–99)
Phosphorus: 4 mg/dL (ref 2.5–4.6)
Phosphorus: 6.8 mg/dL — ABNORMAL HIGH (ref 2.5–4.6)
Potassium: 4.4 mmol/L (ref 3.5–5.1)
Potassium: 4.7 mmol/L (ref 3.5–5.1)
Sodium: 133 mmol/L — ABNORMAL LOW (ref 135–145)
Sodium: 132 mmol/L — ABNORMAL LOW (ref 135–145)

## 2018-04-11 LAB — POCT I-STAT 7, (LYTES, BLD GAS, ICA,H+H)
Bicarbonate: 25.5 mmol/L (ref 20.0–28.0)
Calcium, Ion: 1.17 mmol/L (ref 1.15–1.40)
HCT: 41 % (ref 39.0–52.0)
Hemoglobin: 13.9 g/dL (ref 13.0–17.0)
O2 Saturation: 99 %
Patient temperature: 98
Potassium: 4.7 mmol/L (ref 3.5–5.1)
Sodium: 134 mmol/L — ABNORMAL LOW (ref 135–145)
TCO2: 27 mmol/L (ref 22–32)
pCO2 arterial: 44.4 mmHg (ref 32.0–48.0)
pH, Arterial: 7.366 (ref 7.350–7.450)
pO2, Arterial: 120 mmHg — ABNORMAL HIGH (ref 83.0–108.0)

## 2018-04-11 LAB — GLUCOSE, CAPILLARY
Glucose-Capillary: 143 mg/dL — ABNORMAL HIGH (ref 70–99)
Glucose-Capillary: 161 mg/dL — ABNORMAL HIGH (ref 70–99)
Glucose-Capillary: 173 mg/dL — ABNORMAL HIGH (ref 70–99)
Glucose-Capillary: 174 mg/dL — ABNORMAL HIGH (ref 70–99)
Glucose-Capillary: 182 mg/dL — ABNORMAL HIGH (ref 70–99)
Glucose-Capillary: 197 mg/dL — ABNORMAL HIGH (ref 70–99)
Glucose-Capillary: 67 mg/dL — ABNORMAL LOW (ref 70–99)

## 2018-04-11 LAB — PROTIME-INR
INR: 2.15
INR: 2
Prothrombin Time: 23.7 seconds — ABNORMAL HIGH (ref 11.4–15.2)
Prothrombin Time: 22.4 seconds — ABNORMAL HIGH (ref 11.4–15.2)

## 2018-04-11 LAB — MAGNESIUM: MAGNESIUM: 3 mg/dL — AB (ref 1.7–2.4)

## 2018-04-11 LAB — APTT: aPTT: 40 seconds — ABNORMAL HIGH (ref 24–36)

## 2018-04-11 MED ORDER — INSULIN ASPART 100 UNIT/ML ~~LOC~~ SOLN: 2.0000 [IU] | SUBCUTANEOUS | Status: DC

## 2018-04-11 MED ORDER — INSULIN ASPART 100 UNIT/ML ~~LOC~~ SOLN
0.0000 [IU] | SUBCUTANEOUS | Status: DC
  Administered 2018-04-11: 2 [IU] via SUBCUTANEOUS
  Administered 2018-04-11: 3 [IU] via SUBCUTANEOUS
  Administered 2018-04-11: 4 [IU] via SUBCUTANEOUS
  Administered 2018-04-12: 3 [IU] via SUBCUTANEOUS
  Administered 2018-04-12: 2 [IU] via SUBCUTANEOUS
  Administered 2018-04-12: 3 [IU] via SUBCUTANEOUS
  Administered 2018-04-12 – 2018-04-13 (×4): 2 [IU] via SUBCUTANEOUS
  Administered 2018-04-13 (×2): 3 [IU] via SUBCUTANEOUS
  Administered 2018-04-13 – 2018-04-14 (×6): 2 [IU] via SUBCUTANEOUS
  Administered 2018-04-14: 3 [IU] via SUBCUTANEOUS
  Administered 2018-04-14 – 2018-04-16 (×7): 2 [IU] via SUBCUTANEOUS
  Administered 2018-04-16 (×4): 3 [IU] via SUBCUTANEOUS
  Administered 2018-04-16: 2 [IU] via SUBCUTANEOUS
  Administered 2018-04-17 (×3): 3 [IU] via SUBCUTANEOUS

## 2018-04-11 MED ORDER — DEXTROSE 50 % IV SOLN
INTRAVENOUS | Status: AC
  Administered 2018-04-11: 25 mL
  Filled 2018-04-11: qty 50

## 2018-04-11 MED ORDER — ALBUTEROL SULFATE (2.5 MG/3ML) 0.083% IN NEBU
2.5000 mg | INHALATION_SOLUTION | Freq: Four times a day (QID) | RESPIRATORY_TRACT | Status: DC
  Administered 2018-04-11 – 2018-04-17 (×27): 2.5 mg via RESPIRATORY_TRACT
  Filled 2018-04-11 (×27): qty 3

## 2018-04-11 MED ORDER — FENTANYL 2500MCG IN NS 250ML (10MCG/ML) PREMIX INFUSION
0.0000 ug/h | INTRAVENOUS | Status: DC
  Administered 2018-04-11: 200 ug/h via INTRAVENOUS
  Filled 2018-04-11 (×2): qty 250

## 2018-04-11 MED ORDER — HEPARIN (PORCINE) 25000 UT/250ML-% IV SOLN
950.0000 [IU]/h | INTRAVENOUS | Status: DC
  Administered 2018-04-11: 1450 [IU]/h via INTRAVENOUS
  Administered 2018-04-12: 1100 [IU]/h via INTRAVENOUS
  Filled 2018-04-11 (×2): qty 250

## 2018-04-11 NOTE — Progress Notes (Signed)
[place] for warfarin >> heparin Indication: atrial fibrillation  Patient Measurements: Height: 5\' 10"  (177.8 cm) Weight: 227 lb 1.2 oz (103 kg) IBW/kg (Calculated) : 73  HDW: 94.8 kg  Vital Signs: Temp: 98.6 F (37 C) (01/30 1608) Temp Source: Axillary (01/30 1608) BP: 88/57 (01/30 1800) Pulse Rate: 98 (01/30 1800)  Labs: Recent Labs    04/09/18 0426  04/10/18 0428 04/10/18 1242 04/10/18 1711 04/10/18 2207 04/11/18 0358 04/11/18 0359 04/11/18 1820  HGB 12.7*   < > 12.8* 14.3  --  14.3 13.9  --   --   HCT 42.8   < > 41.4 42.0  --  42.0 41.0  --   --   PLT 384  --  413*  --   --   --   --   --   --   APTT 51*  --  54*  --   --   --   --  40*  --   LABPROT 26.0*  --  26.1*  --   --   --   --  23.7* 22.4*  INR 2.42  --  2.43  --   --   --   --  2.15 2.00  CREATININE 5.01*   < > 4.32*  --  3.96*  --   --  3.50* 4.37*   < > = values in this interval not displayed.    Assessment: 48 year old male presenting with NV and fever, h/o afib on warfarin PTA. Patient s/p FFP and vitamin K this admit with supratherapeutic INR and need to place dialysis catheter. Warfarin was restarted 1/25 (prior to that his last dose was on 1/19).  Pharmacy now consulted to transition to heparin when INR<2. Warfarin held. INR down to 2.0 tonight. H/H and Plt stable. No bleeding issues documented.  Goal of Therapy:  Heparin level 0.3-0.7 units/ml Monitor platelets by anticoagulation protocol: Yes   Plan:  No bolus. Start heparin at 1450 units/hr at 2000, as would expect INR to be <2 by that time 8h heparin level Monitor daily heparin level and CBC, s/sx bleeding  Elicia Lamp, PharmD, BCPS Please check AMION for all [place] contact numbers Clinical Pharmacist 04/11/2018 7:05 PM

## 2018-04-11 NOTE — Progress Notes (Addendum)
[place] for Warfarin/Heparin Indication: atrial fibrillation  Patient Measurements: Height: 5\' 10"  (177.8 cm) Weight: 227 lb 1.2 oz (103 kg) IBW/kg (Calculated) : 73  Vital Signs: Temp: 98 F (36.7 C) (01/30 0358) Temp Source: Oral (01/30 0358) BP: 99/74 (01/30 0700) Pulse Rate: 96 (01/30 0700)  Labs: Recent Labs    04/09/18 0426  04/10/18 0428 04/10/18 1242 04/10/18 1711 04/10/18 2207 04/11/18 0358 04/11/18 0359  HGB 12.7*   < > 12.8* 14.3  --  14.3 13.9  --   HCT 42.8   < > 41.4 42.0  --  42.0 41.0  --   PLT 384  --  413*  --   --   --   --   --   APTT 51*  --  54*  --   --   --   --  40*  LABPROT 26.0*  --  26.1*  --   --   --   --  23.7*  INR 2.42  --  2.43  --   --   --   --  2.15  CREATININE 5.01*   < > 4.32*  --  3.96*  --   --  3.50*   < > = values in this interval not displayed.    Assessment: 48 year old male presenting with NV and fever, h/o afib on warfarin PTA. Pharmacy consulted to continue warfarin dosing.   Patient had a supratherapeutic INR on 1/21 - he received FFP in an attempt to lower the INR. On 1/24, the INR was still > 2 , he received 1 mg of Vitamin K in anticipation of needing to place a dialysis catheter. Warfarin was restarted 1/25, prior to that his last dose was on 1/19.  INR remains WNL and CCM recommends d/c warfarin and switch to heparin gtt when INR <2.  H/H and Plt stable. No bleeding noted.   PTA warfarin dose: 7.5 mg MWF, 5 mg TTSS  Goal of Therapy:  INR 2-3 Monitor platelets by anticoagulation protocol: Yes   Plan:  Check INR at 1800  Start heparin when INR < 2 Monitor CBC trend and s/sx of bleeding  Carlyon Shadow 04/11/2018 7:25 AM

## 2018-04-11 NOTE — Progress Notes (Signed)
[facility] ROUNDING NOTE   Subjective:   Admitted 03/29/2018 hypoxic respiratory failure thought to be secondary to acute community-acquired pneumonia.  PEA arrest 04/01/2018 intubated CPR and epinephrine x2.  History of hypertension hyperlipidemia type 2 diabetes congestive heart failure ejection fraction 25 to 30% with AICD.  Chronic kidney disease stage III/4 baseline creatinine 2.5-2.8.  Now with acute kidney injury continues on CRRT treatment.  Initiated 04/01/2018.  HD catheter right IJ placed 04/06/2018.  Blood pressure 105/77 pulse 79 temperature 99.4 O2 sats 97% 90% oxygen ventilator  IV norepinephrine.  Sodium 133 potassium 4.4 chloride 96 CO2 22 BUN 46 creatinine 3.5 glucose 194 phosphorus 4.0 magnesium 3.0 albumin 2.7 calcium 9.2 hemoglobin 13.9 INR 2.15.    Some clotting of dialysis system secondary to high return pressures.   Objective:  Vital signs in last 24 hours:  Temp:  [98 F (36.7 C)-99.4 F (37.4 C)] 99.4 F (37.4 C) (01/30 0755) Pulse Rate:  [78-103] 82 (01/30 0900) Resp:  [12-23] 21 (01/30 0900) BP: (79-150)/(59-123) 110/84 (01/30 0900) SpO2:  [89 %-99 %] 96 % (01/30 0900) Arterial Line BP: (79-171)/(53-105) 109/74 (01/30 0900) FiO2 (%):  [40 %-100 %] 90 % (01/30 0800) Weight:  [103 kg] 103 kg (01/30 0500)  Weight change: -1 kg Filed Weights   04/09/18 0500 04/10/18 0457 04/11/18 0500  Weight: 104.8 kg 104 kg 103 kg    Intake/Output: I/O last 3 completed shifts: In: 1686.4 [I.V.:251.4; NG/GT:1435] Out: 7408 [Other:3381]   Intake/Output this shift:  Total I/O In: 153.7 [I.V.:18.7; NG/GT:135] Out: 621 [Emesis/NG output:350; Other:271]  Sedated CVS- RRR no murmurs rubs gallops RS- CTA no wheezes or rales ABD-distended hypoactive bowels EXT-1+ pitting edema   Basic Metabolic Panel: Recent Labs  Lab 04/07/18 0500  04/08/18 0500  04/09/18 0426 04/09/18 1653  04/10/18 0428 04/10/18 1242 04/10/18 1711 04/10/18 2207  04/11/18 0358 04/11/18 0359  NA 136   < > 136   < > 134* 132*   < > 131* 134* 130* 134* 134* 133*  K 3.6   < > 3.7   < > 4.5 5.1   < > 4.7 4.2 4.4 4.7 4.7 4.4  CL 97*   < > 100   < > 96* 98  --  97*  --  95*  --   --  96*  CO2 20*   < > 20*   < > 23 19*  --  21*  --  21*  --   --  22  GLUCOSE 286*   < > 186*   < > 148* 235*  --  262*  --  279*  --   --  194*  BUN 103*   < > 103*   < > 52* 45*  --  47*  --  45*  --   --  46*  CREATININE 10.34*   < > 10.34*   < > 5.01* 4.53*  --  4.32*  --  3.96*  --   --  3.50*  CALCIUM 9.9   < > 9.2   < > 9.4 9.4  --  9.2  --  9.4  --   --  9.2  MG 3.6*  --  3.3*  --  2.8*  --   --  2.9*  --   --   --   --  3.0*  PHOS 4.8*   < > 5.1*   < > 4.9* 5.6*  --  4.7*  --  4.9*  --   --  4.0   < > = values in this interval not displayed.    Liver Function Tests: Recent Labs  Lab 04/09/18 0426 04/09/18 1653 04/10/18 0428 04/10/18 1711 04/11/18 0359  ALBUMIN 2.7* 2.8* 2.7* 2.7* 2.7*   No results for input(s): LIPASE, AMYLASE in the last 168 hours. No results for input(s): AMMONIA in the last 168 hours.  CBC: Recent Labs  Lab 04/06/18 0416 04/07/18 0500 04/08/18 0500 04/09/18 0426 04/10/18 0408 04/10/18 0428 04/10/18 1242 04/10/18 2207 04/11/18 0358  WBC 15.2* 13.2* 11.3* 11.2*  --  13.2*  --   --   --   HGB 13.0 12.9* 11.9* 12.7* 13.9 12.8* 14.3 14.3 13.9  HCT 43.2 41.4 39.8 42.8 41.0 41.4 42.0 42.0 41.0  MCV 94.1 93.5 94.1 95.7  --  95.6  --   --   --   PLT 387 388 375 384  --  413*  --   --   --     Cardiac Enzymes: No results for input(s): CKTOTAL, CKMB, CKMBINDEX, TROPONINI in the last 168 hours.  BNP: Invalid input(s): POCBNP  CBG: Recent Labs  Lab 04/10/18 1952 04/10/18 2319 04/11/18 0355 04/11/18 0753 04/11/18 0906  GLUCAP 276* 182* 174* 45* 197*    Microbiology: Results for orders placed or performed during the hospital encounter of 03/28/18  Culture, blood (routine x 2)     Status: None   Collection Time: 03/28/18  10:50 AM  Result Value Ref Range Status   Specimen Description BLOOD RIGHT ANTECUBITAL  Final   Special Requests   Final    BOTTLES DRAWN AEROBIC AND ANAEROBIC Blood Culture adequate volume   Culture   Final    NO GROWTH 5 DAYS Performed at [facility], [place] [address], [place], [place] [address]    Report Status 04/02/2018 FINAL  Final  Culture, blood (routine x 2)     Status: None   Collection Time: 03/28/18 10:50 AM  Result Value Ref Range Status   Specimen Description BLOOD LEFT HAND  Final   Special Requests   Final    BOTTLES DRAWN AEROBIC AND ANAEROBIC Blood Culture adequate volume   Culture   Final    NO GROWTH 5 DAYS Performed at [facility], [place] [address] [place], [place] [address]    Report Status 04/02/2018 FINAL  Final  Urine culture     Status: None   Collection Time: 03/28/18 11:50 AM  Result Value Ref Range Status   Specimen Description URINE, CATHETERIZED  Final   Special Requests NONE  Final   Culture   Final    NO GROWTH Performed at [facility], [place] [address], [place],  [address]    Report Status 03/29/2018 FINAL  Final  Respiratory Panel by PCR     Status: Abnormal   Collection Time: 03/29/18  3:33 PM  Result Value Ref Range Status   Adenovirus NOT DETECTED NOT DETECTED Final   Coronavirus 229E NOT DETECTED NOT DETECTED Final   Coronavirus HKU1 NOT DETECTED NOT DETECTED Final   Coronavirus NL63 NOT DETECTED NOT DETECTED Final   Coronavirus OC43 NOT DETECTED NOT DETECTED Final   Metapneumovirus DETECTED (A) NOT DETECTED Final   Rhinovirus / Enterovirus NOT DETECTED NOT DETECTED Final   Influenza A NOT DETECTED NOT DETECTED Final   Influenza B NOT DETECTED NOT DETECTED Final   Parainfluenza Virus 1 NOT DETECTED NOT DETECTED Final   Parainfluenza Virus 2 NOT DETECTED NOT DETECTED Final  Parainfluenza Virus 3 NOT DETECTED NOT DETECTED Final   Parainfluenza Virus 4 NOT DETECTED NOT DETECTED Final   Respiratory Syncytial  Virus NOT DETECTED NOT DETECTED Final   Bordetella pertussis NOT DETECTED NOT DETECTED Final   Chlamydophila pneumoniae NOT DETECTED NOT DETECTED Final   Mycoplasma pneumoniae NOT DETECTED NOT DETECTED Final    Comment: Performed at [facility], [place] [address] [place], [place] [address]  MRSA PCR Screening     Status: None   Collection Time: 03/29/18  3:33 PM  Result Value Ref Range Status   MRSA by PCR NEGATIVE NEGATIVE Final    Comment:        The GeneXpert MRSA Assay (FDA approved for NASAL specimens only), is one component of a comprehensive MRSA colonization surveillance program. It is not intended to diagnose MRSA infection nor to guide or monitor treatment for MRSA infections. Performed at [facility], [place] [address], [place], [place] [address]   Expectorated sputum assessment w rflx to resp cult     Status: None   Collection Time: 03/30/18  4:34 AM  Result Value Ref Range Status   Specimen Description EXPECTORATED SPUTUM  Final   Special Requests NONE  Final   Sputum evaluation   Final    Sputum specimen not acceptable for testing.  Please recollect.   Gram Stain Report Called to,Read Back By and Verified With: J. BEABRAUT RN, AT 1109 03/30/18 BY Rush Landmark Performed at [facility], [place] [address] [place], [place] [address]    Report Status 03/30/2018 FINAL  Final    Coagulation Studies: Recent Labs    04/09/18 0426 04/10/18 0428 04/11/18 0359  LABPROT 26.0* 26.1* 23.7*  INR 2.42 2.43 2.15    Urinalysis: No results for input(s): COLORURINE, LABSPEC, PHURINE, GLUCOSEU, HGBUR, BILIRUBINUR, KETONESUR, PROTEINUR, UROBILINOGEN, NITRITE, LEUKOCYTESUR in the last 72 hours.  Invalid input(s): APPERANCEUR    Imaging: Dg Chest Port 1 View  Result Date: 04/11/2018 CLINICAL DATA:  Respiratory failure and pneumonia. EXAM: PORTABLE CHEST 1 VIEW COMPARISON:  04/08/2018 FINDINGS: Stable cardiac enlargement and appearance of pacing/ICD device. Stable  jugular central line extending into SVC and gastric decompression tube extending below the diaphragm. Lung volumes are lower bilaterally with persistent bibasilar atelectasis and airspace disease present. No overt pulmonary edema, pneumothorax or pleural effusion. IMPRESSION: Lower bilateral lung volumes with bibasilar atelectasis/airspace disease. Electronically Signed   By: Aletta Edouard M.D.   On: 04/11/2018 10:34     Medications:   .  prismasol BGK 4/2.5 400 mL/hr at 04/10/18 1554  . sodium chloride Stopped (04/05/18 0948)  . HYDROmorphone 3 mg/hr (04/11/18 1000)  . norepinephrine (LEVOPHED) Adult infusion Stopped (04/11/18 0450)  . prismasol BGK 0/2.5 400 mL/hr at 04/10/18 1229  . prismasol BGK 4/2.5 2,000 mL/hr at 04/11/18 0912   . albuterol  2.5 mg Nebulization Q6H  . allopurinol  300 mg Per Tube Daily  . alteplase  4 mg Intracatheter Once  . amiodarone  200 mg Per Tube Daily  . atorvastatin  40 mg Per Tube q1800  . chlorhexidine gluconate (MEDLINE KIT)  15 mL Mouth Rinse BID  . Chlorhexidine Gluconate Cloth  6 each Topical Daily  . docusate  50 mg Per Tube BID  . feeding supplement (PRO-STAT SUGAR FREE 64)  30 mL Per Tube QID  . feeding supplement (VITAL HIGH PROTEIN)  1,000 mL Per Tube Q24H  . insulin aspart  0-15 Units Subcutaneous Q4H  . mouth rinse  15 mL Mouth Rinse 10 times per day  . nystatin  5 mL Oral QID  . pantoprazole sodium  40 mg Per Tube Daily  . sennosides  5 mL Per Tube BID  . sodium chloride flush  10-40 mL Intracatheter Q12H   sodium chloride, acetaminophen **OR** acetaminophen, bisacodyl, diphenhydrAMINE, fentaNYL, heparin, heparin, HYDROmorphone, lip balm, lip balm, midazolam, ondansetron (ZOFRAN) IV **OR** [DISCONTINUED] ondansetron, sodium chloride flush  Assessment/ Plan:   Acute hypoxic respiratory failure following a viral pneumonia day 10 on ventilator followed by CCM.  Now off antibiotics metapneumovirus grown  Ileus/small bowel obstruction.   KUBs without any distention or air-fluid levels CT scan of abdomen ordered today  Acute on chronic heart failure secondary to cardiogenic shock PEA arrest continues on Levophed.  Coumadin has been stopped.  INR greater than 2.  Ejection fraction 25 to 30%  Atrial fibrillation/flutter.  Anticoagulated restarted amiodarone  Status post ICD placement stable  Acute kidney injury secondary to acute tubular necrosis and cardiogenic shock.  Continues on CRRT.  I would recommend a new dialysis catheter due to high return pressures.  I doubt this is a problem needing further anticoagulation  Diabetes mellitus appears to be stable per primary team  Volume/hypertension removing fluid at 50 cc an hour  Acute metabolic encephalopathy as per primary team   LOS: [place] '@TODAY' '@10' :47 AM

## 2018-04-11 NOTE — Progress Notes (Signed)
Hypoglycemic Event  CBG: 67  Treatment: 1/2 amp D50  Symptoms: none  Follow-up CBG: Time:0900 CBG Result:197  Possible Reasons for Event: adjustments in insulin regimen   Comments/MD notified: Tamela Gammon

## 2018-04-11 NOTE — Progress Notes (Signed)
Pt transported to and from 2M09 to CT2 on ventilator. Pt stable throughout with no complications. VS within normal limits.

## 2018-04-11 NOTE — Procedures (Signed)
Central Venous Hemodialysis Catheter Insertion Procedure Note Matt Delpizzo [id number] Feb 20, 1971  Procedure: Insertion of HD Central Venous Catheter Indications: Dialysis   Procedure Details Consent: Risks of procedure as well as the alternatives and risks of each were explained to the (patient/caregiver).  Consent for procedure obtained. Time Out: Verified patient identification, verified procedure, site/side was marked, verified correct patient position, special equipment/implants available, medications/allergies/relevent history reviewed, required imaging and test results available.  Performed  Maximum sterile technique was used including antiseptics, cap, gloves, gown, hand hygiene, mask and sheet. Skin prep: Chlorhexidine; local anesthetic administered A antimicrobial bonded/coated triple lumen catheter was placed in the left internal jugular vein using the Seldinger technique.  Evaluation Blood flow good Complications: No apparent complications Patient did tolerate procedure well. Chest X-ray ordered to verify placement.  CXR: pending.  Velna Ochs 04/11/2018, 2:32 PM

## 2018-04-11 NOTE — Progress Notes (Addendum)
NAME:  Travis Bryan, MRN:  [id number], DOB:  [birth year], LOS: 83 ADMISSION DATE:  03/28/2018, CONSULTATION DATE:  03/28/2018 REFERRING MD:  Graciella Freer, CHIEF COMPLAINT:  Respiratory failure.   Brief History   48 year old man present with fever and nausea and vomiting. Increase in weight and dyspnea.Started on BiPAP and antibiotics for suspicion of pneumonia with superimposed decompensated heart failure. PEA arrest on 1/20. Intubated, ROSC achieved after 6 minutes of CPR and Epi x 2.   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 PCCM consulted for persistent hypoxic respiratory failure despite 15 L per minute HFNC and he was transferred to the ICU.   1/20 PEA Arrest (6 minutes), intubated, started on CVVHD  Consults:  CCM  Heart failure  Nephrology   Procedures:  1/20 ETT 1/20 Fem HD Cath   Significant Diagnostic Tests:  None   Micro Data:  1/18 Resp cx - neg (final)  1/17 RVP - metapneumovirus   1/16 blood - negative  1/16 urine culture - neg  MRSA neg   Antimicrobials:  1/17 doxycycline > 1/18  1/17 vanc > 1/18  1/17 cefepime >> dc'd 1/20 1/21 Ceftriaxone >> 1/23  Interim history/subjective:  Alert this morning on dilaudid drip. Continues to have issues with HD cath and high filter pressures. Attempted to wean vent settings yesterday, ABG overnight with paO2 57, FiO2 increased back to 100%. Down to 90% this morning. Pulse ox not correlating.   Objective   Blood pressure 99/74, pulse 96, temperature 98 F (36.7 C), temperature source Oral, resp. rate (!) 22, height 5\' 10"  (1.778 m), weight 103 kg, SpO2 97 %.    Vent Mode: PRVC FiO2 (%):  [40 %-100 %] 100 % Set Rate:  [18 bmp-22 bmp] 22 bmp Vt Set:  [580 mL] 580 mL PEEP:  [5 cmH20-8 cmH20]  8 cmH20 Plateau Pressure:  [21 cmH20-27 cmH20] 21 cmH20   Intake/Output Summary (Last 24 hours) at 04/11/2018 0720 Last data filed at 04/11/2018 0600 Gross per 24 hour  Intake 883.56 ml  Output 2019 ml  Net -1135.44 ml   Filed Weights   04/09/18 0500 04/10/18 0457 04/11/18 0500  Weight: 104.8 kg 104 kg 103 kg    Examination:   General - sedated Eyes - pupils reactive ENT - ETT in place Cardiac - RRR, no m/r/g Chest - CTAB, no wheezing  Abdomen - Distended , + bowel sounds Extremities - trace edema Skin - no rashes Neuro - RASS -1  Resolved Hospital Problem list     Assessment & Plan:   Acute Hypoxic Respiratory Failure: Slow recovery following viral pneumonia complicated by decompensated HF and cardiorenal syndrome. Day 10 on vent, continues to have difficulty weaning.  -- CT Chest wo today  -- PEEP and FiO2 to keep goal O2 88% or above -- Will likely need trach; will touch base with family  -- CVVHD per nephro; net - 50 cc/hr -- Continue Dilaudid gtt for RASS goal 0 to -1  ?SBO: Abdomen very distended and firm. Has had 2 KUBs without distention or air fluid levels. Given difficulty weaning, may need to rule out intra abdominal issues contributing.  -- CT abdomen w/o contrast  -- Stop tube feeds -- OG to suction   Acute on chronic systolic CHF with cardiogenic shock PEA  arrest from respiratory failure 1/20 Hx of NICM s/p ICD, A fib/flutter -- Continue home amiodarone, remains in NSR -- Stop warfarin; start heparin gtt once INR < 2.0 -- Levophed to keep MAP  > 65 -- Holding HF meds with hypotension   Acute Renal Failure: Cardiorenal / ATN. No sign of recovery thus far, remains anuric. Continues to have problems with HD cath and high filter pressures. Will likely need new line or tunneled cath. Will touch base with nephro.  -- CVVHD per nephro  -- pulling - 50 cc/hr today   DM: With labile CBGs, off insulin drip and improved with increased sub q regimen yesterday,  now hypoglycemic this am. Making NPO with ab distention. -- Hold Levemir -- Stop tube feed coverage  -- q4h SSI-mod   Acute metabolic encephalopathy: -- RASS goal 0 to -1 -- Continue dilaudid gtt  Best practice:  Diet: Tube feeds DVT prophylaxis: continue coumadin  GI prophylaxis: protonix Glucose: Levemir, novolog, and SSI   Mobility: Bedrest  Code Status: FULL Family Communication: no family at bedside  Velna Ochs, M.D. - PGY3 Pager: [phone] 04/11/2018, 7:20 AM

## 2018-04-12 LAB — GLUCOSE, CAPILLARY
Glucose-Capillary: 122 mg/dL — ABNORMAL HIGH (ref 70–99)
Glucose-Capillary: 122 mg/dL — ABNORMAL HIGH (ref 70–99)
Glucose-Capillary: 138 mg/dL — ABNORMAL HIGH (ref 70–99)
Glucose-Capillary: 143 mg/dL — ABNORMAL HIGH (ref 70–99)
Glucose-Capillary: 146 mg/dL — ABNORMAL HIGH (ref 70–99)
Glucose-Capillary: 161 mg/dL — ABNORMAL HIGH (ref 70–99)

## 2018-04-12 LAB — RENAL FUNCTION PANEL
Albumin: 2.7 g/dL — ABNORMAL LOW (ref 3.5–5.0)
Albumin: 2.5 g/dL — ABNORMAL LOW (ref 3.5–5.0)
Anion gap: 14 (ref 5–15)
Anion gap: 14 (ref 5–15)
BUN: 42 mg/dL — ABNORMAL HIGH (ref 6–20)
BUN: 37 mg/dL — ABNORMAL HIGH (ref 6–20)
CALCIUM: 9.4 mg/dL (ref 8.9–10.3)
CHLORIDE: 99 mmol/L (ref 98–111)
CO2: 22 mmol/L (ref 22–32)
CO2: 23 mmol/L (ref 22–32)
CREATININE: 3.72 mg/dL — AB (ref 0.61–1.24)
Calcium: 8.7 mg/dL — ABNORMAL LOW (ref 8.9–10.3)
Chloride: 96 mmol/L — ABNORMAL LOW (ref 98–111)
Creatinine, Ser: 3.06 mg/dL — ABNORMAL HIGH (ref 0.61–1.24)
GFR calc Af Amer: 21 mL/min — ABNORMAL LOW (ref >60)
GFR calc Af Amer: 27 mL/min — ABNORMAL LOW (ref >60)
GFR calc non Af Amer: 18 mL/min — ABNORMAL LOW (ref >60)
GFR calc non Af Amer: 23 mL/min — ABNORMAL LOW (ref >60)
GLUCOSE: 165 mg/dL — AB (ref 70–99)
Glucose, Bld: 170 mg/dL — ABNORMAL HIGH (ref 70–99)
Phosphorus: 5.8 mg/dL — ABNORMAL HIGH (ref 2.5–4.6)
Phosphorus: 5 mg/dL — ABNORMAL HIGH (ref 2.5–4.6)
Potassium: 5.2 mmol/L — ABNORMAL HIGH (ref 3.5–5.1)
Potassium: 4.9 mmol/L (ref 3.5–5.1)
Sodium: 132 mmol/L — ABNORMAL LOW (ref 135–145)
Sodium: 136 mmol/L (ref 135–145)

## 2018-04-12 LAB — POCT I-STAT 7, (LYTES, BLD GAS, ICA,H+H)
Acid-base deficit: 3 mmol/L — ABNORMAL HIGH (ref 0.0–2.0)
Bicarbonate: 24.1 mmol/L (ref 20.0–28.0)
Calcium, Ion: 1.18 mmol/L (ref 1.15–1.40)
HCT: 37 % — ABNORMAL LOW (ref 39.0–52.0)
Hemoglobin: 12.6 g/dL — ABNORMAL LOW (ref 13.0–17.0)
O2 Saturation: 90 %
Potassium: 4.6 mmol/L (ref 3.5–5.1)
Sodium: 134 mmol/L — ABNORMAL LOW (ref 135–145)
TCO2: 26 mmol/L (ref 22–32)
pCO2 arterial: 49.4 mmHg — ABNORMAL HIGH (ref 32.0–48.0)
pH, Arterial: 7.297 — ABNORMAL LOW (ref 7.350–7.450)
pO2, Arterial: 65 mmHg — ABNORMAL LOW (ref 83.0–108.0)

## 2018-04-12 LAB — CBC
HCT: 43.5 % (ref 39.0–52.0)
HEMOGLOBIN: 12.7 g/dL — AB (ref 13.0–17.0)
MCH: 28.5 pg (ref 26.0–34.0)
MCHC: 29.2 g/dL — ABNORMAL LOW (ref 30.0–36.0)
MCV: 97.8 fL (ref 80.0–100.0)
Platelets: 392 10*3/uL (ref 150–400)
RBC: 4.45 MIL/uL (ref 4.22–5.81)
RDW: 15.3 % (ref 11.5–15.5)
WBC: 21.2 10*3/uL — ABNORMAL HIGH (ref 4.0–10.5)
nRBC: 3.5 % — ABNORMAL HIGH (ref 0.0–0.2)

## 2018-04-12 LAB — APTT: APTT: 96 s — AB (ref 24–36)

## 2018-04-12 LAB — HEPARIN LEVEL (UNFRACTIONATED)
Heparin Unfractionated: 0.8 IU/mL — ABNORMAL HIGH (ref 0.30–0.70)
Heparin Unfractionated: 0.78 IU/mL — ABNORMAL HIGH (ref 0.30–0.70)

## 2018-04-12 LAB — PROTIME-INR
INR: 2.11
Prothrombin Time: 23.4 seconds — ABNORMAL HIGH (ref 11.4–15.2)

## 2018-04-12 LAB — HEPATIC FUNCTION PANEL
ALT: 34 U/L (ref 0–44)
AST: 57 U/L — ABNORMAL HIGH (ref 15–41)
Albumin: 2.7 g/dL — ABNORMAL LOW (ref 3.5–5.0)
Alkaline Phosphatase: 213 U/L — ABNORMAL HIGH (ref 38–126)
BILIRUBIN TOTAL: 1.1 mg/dL (ref 0.3–1.2)
Bilirubin, Direct: 0.4 mg/dL — ABNORMAL HIGH (ref 0.0–0.2)
Indirect Bilirubin: 0.7 mg/dL (ref 0.3–0.9)
Total Protein: 10.2 g/dL — ABNORMAL HIGH (ref 6.5–8.1)

## 2018-04-12 LAB — MAGNESIUM: Magnesium: 2.9 mg/dL — ABNORMAL HIGH (ref 1.7–2.4)

## 2018-04-12 MED ORDER — FENTANYL 2500MCG IN NS 250ML (10MCG/ML) PREMIX INFUSION
0.0000 ug/h | INTRAVENOUS | Status: DC
  Administered 2018-04-12: 300 ug/h via INTRAVENOUS
  Administered 2018-04-12: 400 ug/h via INTRAVENOUS
  Administered 2018-04-13: 150 ug/h via INTRAVENOUS
  Administered 2018-04-13: 350 ug/h via INTRAVENOUS
  Administered 2018-04-14: 100 ug/h via INTRAVENOUS
  Administered 2018-04-15 – 2018-04-16 (×2): 120 ug/h via INTRAVENOUS
  Filled 2018-04-12 (×8): qty 250

## 2018-04-12 MED ORDER — SODIUM CHLORIDE 0.9 % IV SOLN
2.0000 g | Freq: Two times a day (BID) | INTRAVENOUS | Status: DC
  Filled 2018-04-12: qty 2

## 2018-04-12 MED ORDER — POLYETHYLENE GLYCOL 3350 17 G PO PACK
17.0000 g | PACK | Freq: Every day | ORAL | Status: DC
  Administered 2018-04-12 – 2018-04-18 (×5): 17 g
  Filled 2018-04-12 (×9): qty 1

## 2018-04-12 MED ORDER — PIPERACILLIN-TAZOBACTAM 3.375 G IVPB 30 MIN
3.3750 g | Freq: Four times a day (QID) | INTRAVENOUS | Status: DC
  Administered 2018-04-12 – 2018-04-17 (×21): 3.375 g via INTRAVENOUS
  Filled 2018-04-12 (×22): qty 50

## 2018-04-12 MED ORDER — SODIUM CHLORIDE 0.9 % IV SOLN
INTRAVENOUS | Status: DC
  Administered 2018-04-12: 11:00:00 via INTRAVENOUS

## 2018-04-12 MED ORDER — DEXMEDETOMIDINE HCL IN NACL 400 MCG/100ML IV SOLN
0.4000 ug/kg/h | INTRAVENOUS | Status: DC
  Administered 2018-04-12: 0.4 ug/kg/h via INTRAVENOUS
  Filled 2018-04-12: qty 100

## 2018-04-12 MED ORDER — FENTANYL BOLUS VIA INFUSION
25.0000 ug | INTRAVENOUS | Status: DC | PRN
  Administered 2018-04-12: 50 ug via INTRAVENOUS
  Administered 2018-04-13 – 2018-04-14 (×2): 25 ug via INTRAVENOUS
  Administered 2018-04-14: 50 ug via INTRAVENOUS
  Administered 2018-04-14: 25 ug via INTRAVENOUS
  Administered 2018-04-14 (×2): 50 ug via INTRAVENOUS
  Administered 2018-04-14 – 2018-04-15 (×2): 25 ug via INTRAVENOUS
  Filled 2018-04-12: qty 50

## 2018-04-12 MED ORDER — SODIUM CHLORIDE 0.9 % IV BOLUS
1000.0000 mL | Freq: Once | INTRAVENOUS | Status: AC
  Administered 2018-04-12: 1000 mL via INTRAVENOUS

## 2018-04-12 MED ORDER — SODIUM CHLORIDE 0.9 % IV SOLN
0.5000 mg/h | INTRAVENOUS | Status: DC
  Filled 2018-04-12: qty 5

## 2018-04-12 NOTE — Progress Notes (Addendum)
Pharmacy Antibiotic Note  Travis Bryan is a 48 y.o. male admitted on 03/28/2018 with HCAP.  Pharmacy has been consulted for zosyn dosing. Patient is on CRRT. WBC elevated to 21.1; low-grade fever.   Plan: Initiate zosyn 3.375g IV q6g  Monitor C/S, WBC, temps, s/sx of clinical resolution or worsening   Height: 5\' 10"  (177.8 cm) Weight: 229 lb 4.5 oz (104 kg) IBW/kg (Calculated) : 73  Temp (24hrs), Avg:98.2 F (36.8 C), Min:98 F (36.7 C), Max:98.6 F (37 C)  Recent Labs  Lab 04/07/18 0500  04/08/18 0500  04/09/18 0426  04/10/18 0428 04/10/18 1711 04/11/18 0359 04/11/18 1820 04/12/18 0418  WBC 13.2*  --  11.3*  --  11.2*  --  13.2*  --   --   --  21.2*  CREATININE 10.34*   < > 10.34*   < > 5.01*   < > 4.32* 3.96* 3.50* 4.37* 3.72*   < > = values in this interval not displayed.    Estimated Creatinine Clearance: 29.7 mL/min (A) (by C-G formula based on SCr of 3.72 mg/dL (H)).    Allergies  Allergen Reactions  . No Known Allergies     Antimicrobials this admission: 1/31 Zosyn >>   Microbiology results: 1/31 TA>> pending   Thank you for allowing pharmacy to be a part of this patient's care.  Carlyon Shadow 04/12/2018 8:09 AM

## 2018-04-12 NOTE — Progress Notes (Signed)
ANTICOAGULATION CONSULT NOTE   Pharmacy Consult for  heparin Indication: atrial fibrillation  Patient Measurements: Height: 5\' 10"  (177.8 cm) Weight: 229 lb 4.5 oz (104 kg) IBW/kg (Calculated) : 73  HDW: 94.8 kg  Vital Signs: Temp: 98.3 F (36.8 C) (01/31 0418) Temp Source: Oral (01/31 0418) BP: 96/63 (01/31 0400) Pulse Rate: 85 (01/31 0300)  Labs: Recent Labs    04/10/18 0428  04/10/18 1711 04/10/18 2207 04/11/18 0358 04/11/18 0359 04/11/18 1820 04/12/18 0418  HGB 12.8*   < >  --  14.3 13.9  --   --  12.7*  HCT 41.4   < >  --  42.0 41.0  --   --  43.5  PLT 413*  --   --   --   --   --   --  392  APTT 54*  --   --   --   --  40*  --   --   LABPROT 26.1*  --   --   --   --  23.7* 22.4*  --   INR 2.43  --   --   --   --  2.15 2.00  --   HEPARINUNFRC  --   --   --   --   --   --   --  0.80*  CREATININE 4.32*  --  3.96*  --   --  3.50* 4.37*  --    < > = values in this interval not displayed.    Assessment: 48 year old male with h/o Afib, Coumadin on hold, for heparin  Goal of Therapy:  Heparin level 0.3-0.7 units/ml Monitor platelets by anticoagulation protocol: Yes   Plan:  Decrease Heparin 1200 units/hr Check heparin level in 8 hours.  Phillis Knack, PharmD, BCPS  04/12/2018 4:58 AM

## 2018-04-12 NOTE — Progress Notes (Signed)
NAME:  Travis Bryan, MRN:  [id number], DOB:  [birth year], LOS: 73 ADMISSION DATE:  03/28/2018, CONSULTATION DATE:  03/28/2018 REFERRING MD:  Graciella Freer, CHIEF COMPLAINT:  Respiratory failure.   Brief History   48 year old man present with fever and nausea and vomiting. Increase in weight and dyspnea.Started on BiPAP and antibiotics for suspicion of pneumonia with superimposed decompensated heart failure. PEA arrest on 1/20. Intubated, ROSC achieved after 6 minutes of CPR and Epi x 2.   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 PCCM consulted for persistent hypoxic respiratory failure despite 15 L per minute HFNC and he was transferred to the ICU.   1/20 PEA Arrest (6 minutes), intubated, started on CVVHD  Consults:  CCM  Heart failure  Nephrology   Procedures:  1/20 ETT 1/20 Fem HD Cath   Significant Diagnostic Tests:  None   Micro Data:  1/18 Resp cx - neg (final)  1/17 RVP - metapneumovirus   1/16 blood - negative  1/16 urine culture - neg  MRSA neg   Antimicrobials:  1/17 doxycycline > 1/18  1/17 vanc > 1/18  1/17 cefepime >> dc'd 1/20 1/21 Ceftriaxone >> 1/23  Interim history/subjective:  Sedated this morning on fentanyl. No events overnight.   Objective   Blood pressure (!) 81/65, pulse 78, temperature 98.3 F (36.8 C), temperature source Oral, resp. rate (!) 22, height _0  (1.778 m), weight 104 kg, SpO2 97 %.    Vent Mode: PRVC FiO2 (%):  [60 %-90 %] 60 % Set Rate:  [22 bmp] 22 bmp Vt Set:  [430 mL] 430 mL PEEP:  [8 cmH20-12 cmH20] 10 cmH20 Plateau Pressure:  [19 cmH20-26 cmH20] 19 cmH20   Intake/Output Summary (Last 24 hours) at 04/12/2018 0659 Last data filed at 04/12/2018 0600 Gross per 24 hour  Intake 976.28 ml  Output  1547 ml  Net -570.72 ml   Filed Weights   04/11/18 0500 04/11/18 1900 04/12/18 0432  Weight: 103 kg 103 kg 104 kg    Examination:   General - sedated Eyes - pupils reactive ENT - ETT in place Cardiac - RRR, no m/r/g Chest - CTAB, no wheezing  Abdomen - Distended , faint bowel sounds  Extremities - trace edema Skin - no rashes Neuro - RASS -3  Resolved Hospital Problem list     Assessment & Plan:   Acute Hypoxic Respiratory Failure: Slow recovery following viral pneumonia complicated by decompensated HF and cardiorenal syndrome. Now concern for HCAP with bibasilar opacities on CT chest, leukocytosis, and difficulty weaning from vent (day 11) -- Start zosyn for HCAP  -- Repeat resp cultures  -- PEEP and FiO2 to keep goal O2 88% or above -- Continue Dilaudid gtt for RASS goal 0 to -1  Ileus: Abdomen remains distended, no obstruction on CT abdomen, mild colonic distension.  -- Hold tube feeds -- Colace BID, Senokot BID  -- Add Miralax daily  -- Doculax suppository PRN   Cholelithiasis: Noted on CT abdomen, no radiographic evidence of cholecystitis. Marked leukocytosis but also with likely HCAP. Alk phos is elevated 213 but normal AST, ALT, and T bili.  -- Started zosyn for dual HCAP coverage   Acute on chronic systolic CHF with cardiogenic shock PEA arrest from respiratory failure 1/20 Hx of NICM s/p ICD, A  fib/flutter -- Continue home amiodarone, remains in NSR -- Hold warfarin; heparin per pharmacy  -- Off levophed since 1/30 -- Holding HF meds with hypotension   Acute Renal Failure: Cardiorenal / ATN. No sign of recovery thus far, remains anuric. Left IJ temp HD cath placed yesterday, right IJ cath removed due to clotting and high filter pressures -- CRRT per nephro; planning to transition to intermittent HD once BP allows  -- NS @ 125 cc/hr per nephro   DM: Labile CBGs -- Hold Levemir -- Stop tube feed coverage  -- q4h SSI-mod   Acute metabolic  encephalopathy: -- RASS goal 0 to -1 -- Continue dilaudid gtt   Best practice:  Diet: Tube feeds DVT prophylaxis: continue coumadin  GI prophylaxis: protonix Glucose: SSI q4h  Mobility: Bedrest  Code Status: FULL Family Communication: no family at bedside  Velna Ochs, M.D. - PGY3 Pager: [phone] 04/12/2018, 6:59 AM

## 2018-04-12 NOTE — Progress Notes (Signed)
[facility] ROUNDING NOTE   Subjective:   Admitted 03/29/2018 hypoxic respiratory failure thought to be secondary to acute community-acquired pneumonia.  PEA arrest 04/01/2018 intubated CPR and epinephrine x2.  History of hypertension hyperlipidemia type 2 diabetes congestive heart failure ejection fraction 25 to 30% with AICD.  Chronic kidney disease stage III/4 baseline creatinine 2.5-2.8.  Now with acute kidney injury continues on CRRT treatment.  Initiated 04/01/2018.  HD catheter right IJ placed 04/06/2018.  Blood pressure 88/ 58 pulse 79 temperature 99.4 O2 sats 97% 90% oxygen ventilator  No pressors. Per RN filters not clotting now that on IV heparin systemic. INR 2.1 today.   CT chest 1/30 bilat LL consolidation R> L, no edema otherwise, small lung expansion due to body size   Objective:  Vital signs in last 24 hours:  Temp:  [98 F (36.7 C)-99.4 F (37.4 C)] 98.3 F (36.8 C) (01/31 0418) Pulse Rate:  [64-103] 76 (01/31 0700) Resp:  [12-23] 23 (01/31 0700) BP: (76-127)/(56-105) 81/61 (01/31 0700) SpO2:  [92 %-98 %] 98 % (01/31 0700) Arterial Line BP: (80-171)/(55-109) 101/61 (01/31 0400) FiO2 (%):  [60 %-90 %] 60 % (01/31 0438) Weight:  [103 kg-104 kg] 104 kg (01/31 0432)  Weight change: 0 kg Filed Weights   04/11/18 0500 04/11/18 1900 04/12/18 0432  Weight: 103 kg 103 kg 104 kg    Intake/Output: I/O last 3 completed shifts: In: 1394.2 [I.V.:649.2; NG/GT:745] Out: 2373 [Emesis/NG output:375; HENID:7824]   Intake/Output this shift:  No intake/output data recorded.  Sedated CVS- RRR no murmurs rubs gallops RS- CTA no wheezes or rales ABD-distended hypoactive bowels EXT-1+ pitting edema   Basic Metabolic Panel: Recent Labs  Lab 04/08/18 0500  04/09/18 0426  04/10/18 0428  04/10/18 1711 04/10/18 2207 04/11/18 0358 04/11/18 0359 04/11/18 1820 04/12/18 0418  NA 136   < > 134*   < > 131*   < > 130* 134* 134* 133* 132* 132*  K 3.7   < > 4.5   <  > 4.7   < > 4.4 4.7 4.7 4.4 4.7 5.2*  CL 100   < > 96*   < > 97*  --  95*  --   --  96* 95* 96*  CO2 20*   < > 23   < > 21*  --  21*  --   --  22 21* 22  GLUCOSE 186*   < > 148*   < > 262*  --  279*  --   --  194* 208* 170*  BUN 103*   < > 52*   < > 47*  --  45*  --   --  46* 48* 42*  CREATININE 10.34*   < > 5.01*   < > 4.32*  --  3.96*  --   --  3.50* 4.37* 3.72*  CALCIUM 9.2   < > 9.4   < > 9.2  --  9.4  --   --  9.2 9.4 9.4  MG 3.3*  --  2.8*  --  2.9*  --   --   --   --  3.0*  --  2.9*  PHOS 5.1*   < > 4.9*   < > 4.7*  --  4.9*  --   --  4.0 6.8* 5.8*   < > = values in this interval not displayed.    Liver Function Tests: Recent Labs  Lab 04/10/18 2353 04/10/18 1711 04/11/18 0359 04/11/18 1820 04/12/18 6144  AST  --   --   --   --  57*  ALT  --   --   --   --  34  ALKPHOS  --   --   --   --  213*  BILITOT  --   --   --   --  1.1  PROT  --   --   --   --  10.2*  ALBUMIN 2.7* 2.7* 2.7* 2.7* 2.7*  2.7*   No results for input(s): LIPASE, AMYLASE in the last 168 hours. No results for input(s): AMMONIA in the last 168 hours.  CBC: Recent Labs  Lab 04/07/18 0500 04/08/18 0500 04/09/18 0426  04/10/18 0428 04/10/18 1242 04/10/18 2207 04/11/18 0358 04/12/18 0418  WBC 13.2* 11.3* 11.2*  --  13.2*  --   --   --  21.2*  HGB 12.9* 11.9* 12.7*   < > 12.8* 14.3 14.3 13.9 12.7*  HCT 41.4 39.8 42.8   < > 41.4 42.0 42.0 41.0 43.5  MCV 93.5 94.1 95.7  --  95.6  --   --   --  97.8  PLT 388 375 384  --  413*  --   --   --  392   < > = values in this interval not displayed.    Cardiac Enzymes: No results for input(s): CKTOTAL, CKMB, CKMBINDEX, TROPONINI in the last 168 hours.  BNP: Invalid input(s): POCBNP  CBG: Recent Labs  Lab 04/11/18 1119 04/11/18 1607 04/11/18 1950 04/11/18 2336 04/12/18 0417  GLUCAP 143* 182* 173* 161* 161*    Microbiology: Results for orders placed or performed during the hospital encounter of 03/28/18  Culture, blood (routine x 2)     Status:  None   Collection Time: 03/28/18 10:50 AM  Result Value Ref Range Status   Specimen Description BLOOD RIGHT ANTECUBITAL  Final   Special Requests   Final    BOTTLES DRAWN AEROBIC AND ANAEROBIC Blood Culture adequate volume   Culture   Final    NO GROWTH 5 DAYS Performed at [facility], [place] [address] [place] [address] [place], [place] [address]    Report Status 04/02/2018 FINAL  Final  Culture, blood (routine x 2)     Status: None   Collection Time: 03/28/18 10:50 AM  Result Value Ref Range Status   Specimen Description BLOOD LEFT HAND  Final   Special Requests   Final    BOTTLES DRAWN AEROBIC AND ANAEROBIC Blood Culture adequate volume   Culture   Final    NO GROWTH 5 DAYS Performed at [facility], [place] [address] [place], [place] [address]    Report Status 04/02/2018 FINAL  Final  Urine culture     Status: None   Collection Time: 03/28/18 11:50 AM  Result Value Ref Range Status   Specimen Description URINE, CATHETERIZED  Final   Special Requests NONE  Final   Culture   Final    NO GROWTH Performed at [facility], [place] [address] [place], [place] [address]    Report Status 03/29/2018 FINAL  Final  Respiratory Panel by PCR     Status: Abnormal   Collection Time: 03/29/18  3:33 PM  Result Value Ref Range Status   Adenovirus NOT DETECTED NOT DETECTED Final   Coronavirus 229E NOT DETECTED NOT DETECTED Final   Coronavirus HKU1 NOT DETECTED NOT DETECTED Final   Coronavirus NL63 NOT DETECTED NOT DETECTED Final   Coronavirus OC43 NOT DETECTED  NOT DETECTED Final   Metapneumovirus DETECTED (A) NOT DETECTED Final   Rhinovirus / Enterovirus NOT DETECTED NOT DETECTED Final   Influenza A NOT DETECTED NOT DETECTED Final   Influenza B NOT DETECTED NOT DETECTED Final   Parainfluenza Virus 1 NOT DETECTED NOT DETECTED Final   Parainfluenza Virus 2 NOT DETECTED NOT DETECTED Final   Parainfluenza Virus 3 NOT DETECTED NOT DETECTED Final   Parainfluenza Virus 4 NOT DETECTED NOT DETECTED  Final   Respiratory Syncytial Virus NOT DETECTED NOT DETECTED Final   Bordetella pertussis NOT DETECTED NOT DETECTED Final   Chlamydophila pneumoniae NOT DETECTED NOT DETECTED Final   Mycoplasma pneumoniae NOT DETECTED NOT DETECTED Final    Comment: Performed at [facility], [place] [address] [place], [place] [address]  MRSA PCR Screening     Status: None   Collection Time: 03/29/18  3:33 PM  Result Value Ref Range Status   MRSA by PCR NEGATIVE NEGATIVE Final    Comment:        The GeneXpert MRSA Assay (FDA approved for NASAL specimens only), is one component of a comprehensive MRSA colonization surveillance program. It is not intended to diagnose MRSA infection nor to guide or monitor treatment for MRSA infections. Performed at [facility], [place] [address], [place], [place] [address]   Expectorated sputum assessment w rflx to resp cult     Status: None   Collection Time: 03/30/18  4:34 AM  Result Value Ref Range Status   Specimen Description EXPECTORATED SPUTUM  Final   Special Requests NONE  Final   Sputum evaluation   Final    Sputum specimen not acceptable for testing.  Please recollect.   Gram Stain Report Called to,Read Back By and Verified With: J. BEABRAUT RN, AT 1109 03/30/18 BY Rush Landmark Performed at [facility], [place] [address], [place], [place] [address]    Report Status 03/30/2018 FINAL  Final    Coagulation Studies: Recent Labs    04/10/18 0428 04/11/18 0359 04/11/18 1820 04/12/18 0418  LABPROT 26.1* 23.7* 22.4* 23.4*  INR 2.43 2.15 2.00 2.11     Medications:   .  prismasol BGK 4/2.5 400 mL/hr at 04/12/18 0634  . sodium chloride Stopped (04/05/18 0948)  . fentaNYL infusion INTRAVENOUS 350 mcg/hr (04/12/18 0700)  . heparin 1,200 Units/hr (04/12/18 0700)  . norepinephrine (LEVOPHED) Adult infusion Stopped (04/11/18 0450)  . prismasol BGK 0/2.5 400 mL/hr at 04/12/18 0634  . prismasol BGK 4/2.5 2,000 mL/hr at 04/12/18 9794   .  albuterol  2.5 mg Nebulization Q6H  . allopurinol  300 mg Per Tube Daily  . alteplase  4 mg Intracatheter Once  . amiodarone  200 mg Per Tube Daily  . atorvastatin  40 mg Per Tube q1800  . chlorhexidine gluconate (MEDLINE KIT)  15 mL Mouth Rinse BID  . Chlorhexidine Gluconate Cloth  6 each Topical Daily  . docusate  50 mg Per Tube BID  . feeding supplement (PRO-STAT SUGAR FREE 64)  30 mL Per Tube QID  . feeding supplement (VITAL HIGH PROTEIN)  1,000 mL Per Tube Q24H  . insulin aspart  0-15 Units Subcutaneous Q4H  . mouth rinse  15 mL Mouth Rinse 10 times per day  . nystatin  5 mL Oral QID  . pantoprazole sodium  40 mg Per Tube Daily  . sennosides  5 mL Per Tube BID  . sodium chloride flush  10-40 mL Intracatheter Q12H   sodium chloride, acetaminophen **  OR** acetaminophen, bisacodyl, diphenhydrAMINE, fentaNYL, heparin, heparin, HYDROmorphone, lip balm, lip balm, midazolam, ondansetron (ZOFRAN) IV **OR** [DISCONTINUED] ondansetron, sodium chloride flush  Assessment/ Plan:   Acute hypoxic respiratory failure following a viral pneumonia day 11 on ventilator followed by CCM.  Now off antibiotics metapneumovirus +pcr  Ileus/small bowel obstruction.  KUBs without any distention or air-fluid levels per CT 1/30.   Acute on chronic heart failure secondary to cardiogenic shock PEA arrest continues on Levophed.  Coumadin has been stopped.  INR greater than 2.  Ejection fraction 25 to 30%  Atrial fibrillation/flutter.  Anticoagulated w IV heparin restarted amiodarone  Status post ICD placement stable  Acute kidney injury secondary to acute tubular necrosis and cardiogenic shock.  Continues on CRRT.  Hypovolemia/hypotension- down 14kg since admission on CRRT D#11. Will give IV fluids back until BP's better so we can stop CRRT.  Pt is dry.   Diabetes mellitus appears to be stable per primary team  Acute metabolic encephalopathy as per primary team   LOS: [place] _0 _1 :29  AM

## 2018-04-12 NOTE — Progress Notes (Signed)
[facility] for warfarin >> heparin Indication: atrial fibrillation  Patient Measurements: Height: 5\' 10"  (177.8 cm) Weight: 229 lb 4.5 oz (104 kg) IBW/kg (Calculated) : 73  HDW: 94.8 kg  Vital Signs: Temp: 98.2 F (36.8 C) (01/31 1106) Temp Source: Axillary (01/31 1106) BP: 72/56 (01/31 1400) Pulse Rate: 73 (01/31 1400)  Labs: Recent Labs    04/10/18 0428  04/10/18 2207 04/11/18 0358 04/11/18 0359 04/11/18 1820 04/12/18 0418 04/12/18 1130  HGB 12.8*   < > 14.3 13.9  --   --  12.7*  --   HCT 41.4   < > 42.0 41.0  --   --  43.5  --   PLT 413*  --   --   --   --   --  392  --   APTT 54*  --   --   --  40*  --  96*  --   LABPROT 26.1*  --   --   --  23.7* 22.4* 23.4*  --   INR 2.43  --   --   --  2.15 2.00 2.11  --   HEPARINUNFRC  --   --   --   --   --   --  0.80* 0.78*  CREATININE 4.32*   < >  --   --  3.50* 4.37* 3.72*  --    < > = values in this interval not displayed.    Assessment: 48 year old male presenting with NV and fever, h/o afib on warfarin PTA. Patient s/p FFP and vitamin K this admit with supratherapeutic INR and need to place dialysis catheter. Pharmacy consulted to transition to heparin when INR<2. Warfarin held.  1/31 PM: Heparin infusion currently running at 1200 units/hr, heparin level currently supratherapeutic at 0.78.  Hgb 12.7 and pltc wnl. No bleeding issues documented  Goal of Therapy:  Heparin level 0.3-0.7 units/ml Monitor platelets by anticoagulation protocol: Yes   Plan:  Decrease heparin infusion to 1100 units/hr 8hr heparin level Monitor daily heparin level and CBC, s/sx bleeding  Janae Bridgeman, PharmD PGY1 Pharmacy Resident Phone: [phone] 04/12/2018 2:45 PM

## 2018-04-12 NOTE — Progress Notes (Signed)
Changed FI02 to 70% and peep to 12 per ARDS protocol and DR. Ellison's instructions.

## 2018-04-13 DIAGNOSIS — R451 Restlessness and agitation: Secondary | ICD-10-CM

## 2018-04-13 LAB — CBC
HCT: 39.1 % (ref 39.0–52.0)
Hemoglobin: 11.5 g/dL — ABNORMAL LOW (ref 13.0–17.0)
MCH: 29.4 pg (ref 26.0–34.0)
MCHC: 29.4 g/dL — ABNORMAL LOW (ref 30.0–36.0)
MCV: 100 fL (ref 80.0–100.0)
NRBC: 2.5 % — AB (ref 0.0–0.2)
Platelets: 334 10*3/uL (ref 150–400)
RBC: 3.91 MIL/uL — ABNORMAL LOW (ref 4.22–5.81)
RDW: 15.6 % — ABNORMAL HIGH (ref 11.5–15.5)
WBC: 18.1 10*3/uL — ABNORMAL HIGH (ref 4.0–10.5)

## 2018-04-13 LAB — HEPATIC FUNCTION PANEL
ALT: 28 U/L (ref 0–44)
AST: 49 U/L — ABNORMAL HIGH (ref 15–41)
Albumin: 2.4 g/dL — ABNORMAL LOW (ref 3.5–5.0)
Alkaline Phosphatase: 175 U/L — ABNORMAL HIGH (ref 38–126)
BILIRUBIN DIRECT: 0.7 mg/dL — AB (ref 0.0–0.2)
Indirect Bilirubin: 0.5 mg/dL (ref 0.3–0.9)
Total Bilirubin: 1.2 mg/dL (ref 0.3–1.2)
Total Protein: 9 g/dL — ABNORMAL HIGH (ref 6.5–8.1)

## 2018-04-13 LAB — POCT ACTIVATED CLOTTING TIME
Activated Clotting Time: 224 seconds
Activated Clotting Time: 213 seconds
Activated Clotting Time: 213 seconds
Activated Clotting Time: 213 seconds
Activated Clotting Time: 213 seconds
Activated Clotting Time: 219 seconds

## 2018-04-13 LAB — RENAL FUNCTION PANEL
Albumin: 2.4 g/dL — ABNORMAL LOW (ref 3.5–5.0)
Albumin: 2.4 g/dL — ABNORMAL LOW (ref 3.5–5.0)
Anion gap: 12 (ref 5–15)
Anion gap: 15 (ref 5–15)
BUN: 34 mg/dL — ABNORMAL HIGH (ref 6–20)
BUN: 36 mg/dL — ABNORMAL HIGH (ref 6–20)
CO2: 24 mmol/L (ref 22–32)
CO2: 21 mmol/L — ABNORMAL LOW (ref 22–32)
CREATININE: 2.83 mg/dL — AB (ref 0.61–1.24)
Calcium: 9 mg/dL (ref 8.9–10.3)
Calcium: 8.9 mg/dL (ref 8.9–10.3)
Chloride: 100 mmol/L (ref 98–111)
Chloride: 98 mmol/L (ref 98–111)
Creatinine, Ser: 3.37 mg/dL — ABNORMAL HIGH (ref 0.61–1.24)
GFR calc Af Amer: 29 mL/min — ABNORMAL LOW (ref >60)
GFR calc Af Amer: 24 mL/min — ABNORMAL LOW (ref >60)
GFR calc non Af Amer: 25 mL/min — ABNORMAL LOW (ref >60)
GFR calc non Af Amer: 21 mL/min — ABNORMAL LOW (ref >60)
Glucose, Bld: 142 mg/dL — ABNORMAL HIGH (ref 70–99)
Glucose, Bld: 151 mg/dL — ABNORMAL HIGH (ref 70–99)
Phosphorus: 4.3 mg/dL (ref 2.5–4.6)
Phosphorus: 4.7 mg/dL — ABNORMAL HIGH (ref 2.5–4.6)
Potassium: 4.7 mmol/L (ref 3.5–5.1)
Potassium: 4.8 mmol/L (ref 3.5–5.1)
Sodium: 136 mmol/L (ref 135–145)
Sodium: 134 mmol/L — ABNORMAL LOW (ref 135–145)

## 2018-04-13 LAB — GLUCOSE, CAPILLARY
Glucose-Capillary: 113 mg/dL — ABNORMAL HIGH (ref 70–99)
Glucose-Capillary: 122 mg/dL — ABNORMAL HIGH (ref 70–99)
Glucose-Capillary: 131 mg/dL — ABNORMAL HIGH (ref 70–99)
Glucose-Capillary: 131 mg/dL — ABNORMAL HIGH (ref 70–99)
Glucose-Capillary: 140 mg/dL — ABNORMAL HIGH (ref 70–99)
Glucose-Capillary: 158 mg/dL — ABNORMAL HIGH (ref 70–99)

## 2018-04-13 LAB — POCT I-STAT 7, (LYTES, BLD GAS, ICA,H+H)
Acid-base deficit: 2 mmol/L (ref 0.0–2.0)
Bicarbonate: 23.2 mmol/L (ref 20.0–28.0)
Calcium, Ion: 1.15 mmol/L (ref 1.15–1.40)
HCT: 36 % — ABNORMAL LOW (ref 39.0–52.0)
Hemoglobin: 12.2 g/dL — ABNORMAL LOW (ref 13.0–17.0)
O2 Saturation: 98 %
Patient temperature: 98.6
Potassium: 4.8 mmol/L (ref 3.5–5.1)
Sodium: 134 mmol/L — ABNORMAL LOW (ref 135–145)
TCO2: 24 mmol/L (ref 22–32)
pCO2 arterial: 42 mmHg (ref 32.0–48.0)
pH, Arterial: 7.35 (ref 7.350–7.450)
pO2, Arterial: 120 mmHg — ABNORMAL HIGH (ref 83.0–108.0)

## 2018-04-13 LAB — MAGNESIUM: Magnesium: 2.8 mg/dL — ABNORMAL HIGH (ref 1.7–2.4)

## 2018-04-13 LAB — PROTIME-INR
INR: 2.52
Prothrombin Time: 26.8 seconds — ABNORMAL HIGH (ref 11.4–15.2)

## 2018-04-13 LAB — APTT: aPTT: 93 seconds — ABNORMAL HIGH (ref 24–36)

## 2018-04-13 LAB — HEPARIN LEVEL (UNFRACTIONATED): Heparin Unfractionated: 0.74 IU/mL — ABNORMAL HIGH (ref 0.30–0.70)

## 2018-04-13 MED ORDER — SODIUM CHLORIDE 0.9 % IV SOLN
250.0000 [IU]/h | INTRAVENOUS | Status: DC
  Administered 2018-04-13: 250 [IU]/h via INTRAVENOUS_CENTRAL
  Filled 2018-04-13: qty 2

## 2018-04-13 MED ORDER — HALOPERIDOL LACTATE 5 MG/ML IJ SOLN
5.0000 mg | Freq: Four times a day (QID) | INTRAMUSCULAR | Status: DC
  Administered 2018-04-13 – 2018-04-15 (×8): 5 mg via INTRAVENOUS
  Filled 2018-04-13 (×9): qty 1

## 2018-04-13 MED ORDER — HEPARIN BOLUS VIA INFUSION (CRRT)
1000.0000 [IU] | INTRAVENOUS | Status: DC | PRN
  Filled 2018-04-13: qty 1000

## 2018-04-13 MED ORDER — BISACODYL 10 MG RE SUPP
10.0000 mg | Freq: Once | RECTAL | Status: AC
  Administered 2018-04-13: 10 mg via RECTAL
  Filled 2018-04-13: qty 1

## 2018-04-13 NOTE — Progress Notes (Signed)
NAME:  Travis Bryan, MRN:  [id number], DOB:  [birth year], LOS: 102 ADMISSION DATE:  03/28/2018, CONSULTATION DATE:  03/28/2018 REFERRING MD:  Graciella Freer, CHIEF COMPLAINT:  Respiratory failure.   Brief History   48 year old man present with fever and nausea and vomiting. Increase in weight and dyspnea.Started on BiPAP and antibiotics for suspicion of pneumonia with superimposed decompensated heart failure. PEA arrest on 1/20. Intubated, ROSC achieved after 6 minutes of CPR and Epi x 2.   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 PCCM consulted for persistent hypoxic respiratory failure despite 15 L per minute HFNC and he was transferred to the ICU.   1/20 PEA Arrest (6 minutes), intubated, started on CVVHD  Consults:  CCM  Heart failure  Nephrology   Procedures:  1/20 ETT 1/20 Fem HD Cath   Significant Diagnostic Tests:  None   Micro Data:  1/18 Resp cx - neg (final)  1/17 RVP - metapneumovirus   1/16 blood - negative  1/16 urine culture - neg  MRSA neg   Antimicrobials:  1/17 doxycycline > 1/18  1/17 vanc > 1/18  1/17 cefepime >> dc'd 1/20 1/21 Ceftriaxone >> 1/23 1/31 Zosyn  Interim history/subjective:  Discontinued precedex due to bradycardia. On fentanyl this morning. Continues to have mild-moderate agitation.  Objective   Blood pressure 114/73, pulse 75, temperature 99.9 F (37.7 C), temperature source Axillary, resp. rate 19, height '5\' 10"'  (1.778 m), weight 105 kg, SpO2 92 %.    Vent Mode: PRVC FiO2 (%):  [50 %] 50 % Set Rate:  [22 bmp] 22 bmp Vt Set:  [430 mL] 430 mL PEEP:  [8 cmH20] 8 cmH20 Plateau Pressure:  [19 cmH20-22 cmH20] 22 cmH20   Intake/Output Summary (Last 24 hours) at 04/13/2018 9417 Last data filed at 04/13/2018  0800 Gross per 24 hour  Intake 3287.44 ml  Output 1255 ml  Net 2032.44 ml   Filed Weights   04/11/18 1900 04/12/18 0432 04/13/18 0500  Weight: 103 kg 104 kg 105 kg   Physical Exam: General: Critically ill male laying in bed, agitated and intubated HENT: [place], AT, ETT in place Eyes: EOMI, no scleral icterus Respiratory: Rhonchi bilaterally Cardiovascular: RRR, -M/R/G, no JVD GI: Firm but improved compared to yesterday, hypoactive BS, soft, nontender Extremities:Trace lower extremity edema,-tenderness Neuro: AAO x4, CNII-XII grossly intact GU: Foley in place  Resolved Hospital Problem list   Acute on chronic systolic CHF with cardiogenic shock  Assessment & Plan:   Moderate ARDS Acute Hypoxic Respiratory Failure, multifactorial Metapneumovirus pneumonia with superimposed bacterial pneumonia complicated by decompensated HF and renal failure. Initially improving and this week with concern for ARDS.  -- P:F 130 -- Wean per ARDSnet protocol. Goal PO2 55-80 -- ABG this am  -- CRRT with UF 75cc/hr for volume removal -- Continue Zosyn D2 -- F/u cultures -- Consider for trach candidate due to prolonged vent dependence since 4/08  Acute metabolic encephalopathy Agitation requiring sedation -- RASS goal 0 to -1 -- Fentanyl gtt -- Consider anti-psychotics if QTc appropriate  ?Cholelithiasis Possible Cirrhosis Noted on CT abdomen, no radiographic evidence of cholecystitis. Marked leukocytosis but also with likely HCAP. Alk phos is elevated 213 but normal AST, ALT, and T bili.  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 CRRT with volume removal for ARDS  PEA arrest from respiratory failure 1/20 Hx of NICM s/p ICD, A fib/flutter -- Continue home amiodarone, remains in NSR -- Heparin gtt --  Holding HF meds with borderline hypotension   Paroxysmal Atrial fibrillation -- Heparin gtt  DM -- q4h SSI-mod  -- CBG q4  Best practice:  Diet: Tube feeds DVT prophylaxis: continue coumadin  GI prophylaxis: protonix Glucose: SSI q4h  Mobility: Bedrest  Code Status: FULL Family Communication: No family at bedside  The patient is critically ill with multiple organ systems failure and requires high complexity decision making for assessment and support, frequent evaluation and titration of therapies, application of advanced monitoring technologies and extensive interpretation of multiple databases.   Critical Care Time devoted to patient care services described in this note is 40 Minutes. This time reflects time of care of this signee Dr. Rodman Pickle. This critical care time does not reflect procedure time, or teaching time or supervisory time of PA/NP/Med student/Med Resident etc but could involve care discussion time.

## 2018-04-13 NOTE — Progress Notes (Signed)
Patient ID: Travis Bryan, male   DOB: 1970-08-31, 48 y.o.   MRN: [id number] [facility] Progress Note   Assessment/ Plan:   1.  Acute kidney injury on chronic kidney disease stage IIIb/IV: (baseline creatinine 2.5-2.8) cardiorenal versus ATN and highly likely to have progressed to ESRD at this point.  He remains anuric and we will continue CRRT given hypercatabolic state.  Maintain ultrafiltration at net -75 cc an hour to assist with volume unloading in the setting of ARDS.  He is off of pressors. 2.  Acute hypoxic respiratory failure: With evidence of ARDS and preceding viral pneumonia/superimposed HCAP versus aspiration pneumonia for which he is on intravenous Zosyn.  Will attempt cautious ultrafiltration for volume unloading/improving oxygenation.  Possibly may need tracheostomy given chronic ventilatory dependency so far. 3.  Anemia: Slight downtrend trend of hemoglobin/hematocrit noted.  This is likely anemia of critical illness. 4.  History of atrial fibrillation/atrial flutter status post DCCV and ablation.  He is currently rate controlled and hemodynamically stable off of pressors. 5. Acute exacerbation of CHF with cardiogenic shock: With initial CRRT resulting in possible volume contraction and now appears to be euvolemic to slightly hypervolemic in the setting of ARDS for which we will continue UF.  Subjective:   Continues to have problems with CRRT filter clotting even with systemic heparin.  Off pressors and tolerating UF 75 cc/h.   Objective:   BP 114/73   Pulse 75   Temp 99.9 F (37.7 C) (Axillary)   Resp 19   Ht '5\' 10"'  (1.778 m)   Wt 105 kg   SpO2 92%   BMI 33.21 kg/m   Intake/Output Summary (Last 24 hours) at 04/13/2018 0743 Last data filed at 04/13/2018 0700 Gross per 24 hour  Intake 3363.26 ml  Output 1124 ml  Net 2239.26 ml   Weight change: 2 kg  Physical Exam: Gen: Intubated, sedated CVS: Pulse regular rhythm, normal rate, normal S1 and S2 Resp:  Anteriorly clear to auscultation, no rales/rhonchi Abd: Soft, distended, nontender, no bowel sounds heard Ext: Trace LE and dependent edema  Imaging: Ct Abdomen Pelvis Wo Contrast  Result Date: 04/11/2018 CLINICAL DATA:  48 year old male with hypoxic respiratory failure, chronic kidney disease and acute kidney injury, femur and nausea/vomiting. EXAM: CT CHEST, ABDOMEN AND PELVIS WITHOUT CONTRAST TECHNIQUE: Multidetector CT imaging of the chest, abdomen and pelvis was performed following the standard protocol without IV contrast. COMPARISON:  04/11/2018 and prior chest radiographs. 01/05/2005 abdominal/pelvic CT. FINDINGS: CT CHEST FINDINGS Cardiovascular: Cardiomegaly identified. LEFT-sided AICD is present. A RIGHT IJ central venous catheter is noted with tip overlying the LOWER SVC. No thoracic aortic aneurysm or pericardial effusion identified. Mediastinum/Nodes: Endotracheal tube noted with tip 2.5 cm above the carina. NG tube is present with tip in the distal stomach. No mediastinal mass or definite enlarged lymph nodes. Fall Lungs/Pleura: Consolidation involving the majority of the RIGHT LOWER lobe and much of the LEFT LOWER lobe identified. Bibasilar atelectasis is identified. Mild ground-glass opacities are noted bilaterally and nonspecific. No pleural effusion or pneumothorax. Musculoskeletal: No acute or suspicious bony abnormalities. CT ABDOMEN PELVIS FINDINGS Please note that parenchymal abnormalities may be missed without intravenous contrast. Hepatobiliary: Slightly nodular hepatic contour may represent cirrhosis. No focal hepatic abnormalities are identified. Probable cholelithiasis identified. No CT evidence of acute cholecystitis. Pancreas: Unremarkable Spleen: Unremarkable Adrenals/Urinary Tract: The kidneys, adrenal glands and bladder are unremarkable except for stable LEFT adrenal fullness. Stomach/Bowel: Mildly distended fluid-filled colon noted. No evidence of bowel obstruction or  definite bowel wall thickening. No inflammatory changes are noted. Vascular/Lymphatic: Aortic atherosclerosis. No enlarged abdominal or pelvic lymph nodes. Reproductive: Prostate is unremarkable. Other: No ascites, focal collection or pneumoperitoneum. Musculoskeletal: No acute or suspicious bony abnormalities. IMPRESSION: 1. Bilateral LOWER lobe consolidation involving the majority of the RIGHT LOWER lobe and much of the LEFT LOWER lobe, likely representing pneumonia. Mild bibasilar atelectasis. 2. Mild bilateral ground-glass pulmonary opacities which are nonspecific but may represent mild edema. No pleural effusion or pneumothorax. 3. Cardiomegaly and support apparatus as described. 4. Slightly nodular hepatic contour which may represent cirrhosis-correlate clinically. 5. Probable cholelithiasis.  No CT evidence of acute cholecystitis. 6.  Aortic Atherosclerosis (ICD10-I70.0). Electronically Signed   By: Margarette Canada M.D.   On: 04/11/2018 17:04   Ct Chest Wo Contrast  Result Date: 04/11/2018 CLINICAL DATA:  48 year old male with hypoxic respiratory failure, chronic kidney disease and acute kidney injury, femur and nausea/vomiting. EXAM: CT CHEST, ABDOMEN AND PELVIS WITHOUT CONTRAST TECHNIQUE: Multidetector CT imaging of the chest, abdomen and pelvis was performed following the standard protocol without IV contrast. COMPARISON:  04/11/2018 and prior chest radiographs. 01/05/2005 abdominal/pelvic CT. FINDINGS: CT CHEST FINDINGS Cardiovascular: Cardiomegaly identified. LEFT-sided AICD is present. A RIGHT IJ central venous catheter is noted with tip overlying the LOWER SVC. No thoracic aortic aneurysm or pericardial effusion identified. Mediastinum/Nodes: Endotracheal tube noted with tip 2.5 cm above the carina. NG tube is present with tip in the distal stomach. No mediastinal mass or definite enlarged lymph nodes. Fall Lungs/Pleura: Consolidation involving the majority of the RIGHT LOWER lobe and much of the LEFT  LOWER lobe identified. Bibasilar atelectasis is identified. Mild ground-glass opacities are noted bilaterally and nonspecific. No pleural effusion or pneumothorax. Musculoskeletal: No acute or suspicious bony abnormalities. CT ABDOMEN PELVIS FINDINGS Please note that parenchymal abnormalities may be missed without intravenous contrast. Hepatobiliary: Slightly nodular hepatic contour may represent cirrhosis. No focal hepatic abnormalities are identified. Probable cholelithiasis identified. No CT evidence of acute cholecystitis. Pancreas: Unremarkable Spleen: Unremarkable Adrenals/Urinary Tract: The kidneys, adrenal glands and bladder are unremarkable except for stable LEFT adrenal fullness. Stomach/Bowel: Mildly distended fluid-filled colon noted. No evidence of bowel obstruction or definite bowel wall thickening. No inflammatory changes are noted. Vascular/Lymphatic: Aortic atherosclerosis. No enlarged abdominal or pelvic lymph nodes. Reproductive: Prostate is unremarkable. Other: No ascites, focal collection or pneumoperitoneum. Musculoskeletal: No acute or suspicious bony abnormalities. IMPRESSION: 1. Bilateral LOWER lobe consolidation involving the majority of the RIGHT LOWER lobe and much of the LEFT LOWER lobe, likely representing pneumonia. Mild bibasilar atelectasis. 2. Mild bilateral ground-glass pulmonary opacities which are nonspecific but may represent mild edema. No pleural effusion or pneumothorax. 3. Cardiomegaly and support apparatus as described. 4. Slightly nodular hepatic contour which may represent cirrhosis-correlate clinically. 5. Probable cholelithiasis.  No CT evidence of acute cholecystitis. 6.  Aortic Atherosclerosis (ICD10-I70.0). Electronically Signed   By: Margarette Canada M.D.   On: 04/11/2018 17:04   Dg Chest Port 1 View  Result Date: 04/11/2018 CLINICAL DATA:  Central line placement. EXAM: PORTABLE CHEST 1 VIEW COMPARISON:  04/11/2018 FINDINGS: The endotracheal tube is 3.4 cm above the  carina. The NG tube is coursing down the esophagus and into the stomach. New left IJ central venous catheter tip is along the lateral wall of the SVC near the brachiocephalic junction. The prior right IJ catheter is stable with its tip just below the level of the carina. The pacer wire/AICD is stable. The heart is enlarged but stable. Low lung volumes with vascular  crowding, bibasilar atelectasis and small effusions. IMPRESSION: 1. New left IJ central venous catheter tip is in the SVC near the brachiocephalic junction. 2. Other support apparatus is stable. 3. Stable cardiac enlargement, vascular congestion, bibasilar atelectasis and small effusions. Electronically Signed   By: Marijo Sanes M.D.   On: 04/11/2018 14:53   Dg Chest Port 1 View  Result Date: 04/11/2018 CLINICAL DATA:  Respiratory failure and pneumonia. EXAM: PORTABLE CHEST 1 VIEW COMPARISON:  04/08/2018 FINDINGS: Stable cardiac enlargement and appearance of pacing/ICD device. Stable jugular central line extending into SVC and gastric decompression tube extending below the diaphragm. Lung volumes are lower bilaterally with persistent bibasilar atelectasis and airspace disease present. No overt pulmonary edema, pneumothorax or pleural effusion. IMPRESSION: Lower bilateral lung volumes with bibasilar atelectasis/airspace disease. Electronically Signed   By: Aletta Edouard M.D.   On: 04/11/2018 10:34    Labs: BMET Recent Labs  Lab 04/10/18 0428  04/10/18 1711  04/11/18 0358 04/11/18 0359 04/11/18 1820 04/12/18 0418 04/12/18 1608 04/12/18 1858 04/13/18 0417  NA 131*   < > 130*   < > 134* 133* 132* 132* 136 134* 136  K 4.7   < > 4.4   < > 4.7 4.4 4.7 5.2* 4.9 4.6 4.7  CL 97*  --  95*  --   --  96* 95* 96* 99  --  100  CO2 21*  --  21*  --   --  22 21* 22 23  --  24  GLUCOSE 262*  --  279*  --   --  194* 208* 170* 165*  --  142*  BUN 47*  --  45*  --   --  46* 48* 42* 37*  --  34*  CREATININE 4.32*  --  3.96*  --   --  3.50* 4.37*  3.72* 3.06*  --  2.83*  CALCIUM 9.2  --  9.4  --   --  9.2 9.4 9.4 8.7*  --  9.0  PHOS 4.7*  --  4.9*  --   --  4.0 6.8* 5.8* 5.0*  --  4.3   < > = values in this interval not displayed.   CBC Recent Labs  Lab 04/09/18 0426  04/10/18 0428  04/11/18 0358 04/12/18 0418 04/12/18 1858 04/13/18 0417  WBC 11.2*  --  13.2*  --   --  21.2*  --  18.1*  HGB 12.7*   < > 12.8*   < > 13.9 12.7* 12.6* 11.5*  HCT 42.8   < > 41.4   < > 41.0 43.5 37.0* 39.1  MCV 95.7  --  95.6  --   --  97.8  --  100.0  PLT 384  --  413*  --   --  392  --  334   < > = values in this interval not displayed.    Medications:    . albuterol  2.5 mg Nebulization Q6H  . allopurinol  300 mg Per Tube Daily  . alteplase  4 mg Intracatheter Once  . amiodarone  200 mg Per Tube Daily  . atorvastatin  40 mg Per Tube q1800  . chlorhexidine gluconate (MEDLINE KIT)  15 mL Mouth Rinse BID  . Chlorhexidine Gluconate Cloth  6 each Topical Daily  . docusate  50 mg Per Tube BID  . feeding supplement (PRO-STAT SUGAR FREE 64)  30 mL Per Tube QID  . feeding supplement (VITAL HIGH PROTEIN)  1,000 mL Per Tube Q24H  .  insulin aspart  0-15 Units Subcutaneous Q4H  . mouth rinse  15 mL Mouth Rinse 10 times per day  . nystatin  5 mL Oral QID  . pantoprazole sodium  40 mg Per Tube Daily  . polyethylene glycol  17 g Per Tube Daily  . sennosides  5 mL Per Tube BID  . sodium chloride flush  10-40 mL Intracatheter Q12H   Elmarie Shiley, MD 04/13/2018, 7:43 AM

## 2018-04-13 NOTE — Progress Notes (Signed)
[place] for warfarin >> heparin Indication: atrial fibrillation  Patient Measurements: Height: 5\' 10"  (177.8 cm) Weight: 229 lb 4.5 oz (104 kg) IBW/kg (Calculated) : 73  HDW: 94.8 kg  Vital Signs: Temp: 98.3 F (36.8 C) (01/31 2333) Temp Source: Axillary (01/31 2333) BP: 82/67 (02/01 0000) Pulse Rate: 64 (02/01 0000)  Labs: Recent Labs    04/10/18 0428  04/11/18 0358 04/11/18 0359 04/11/18 1820 04/12/18 0418 04/12/18 1130 04/12/18 1608 04/12/18 1858 04/13/18 0011  HGB 12.8*   < > 13.9  --   --  12.7*  --   --  12.6*  --   HCT 41.4   < > 41.0  --   --  43.5  --   --  37.0*  --   PLT 413*  --   --   --   --  392  --   --   --   --   APTT 54*  --   --  40*  --  96*  --   --   --   --   LABPROT 26.1*  --   --  23.7* 22.4* 23.4*  --   --   --   --   INR 2.43  --   --  2.15 2.00 2.11  --   --   --   --   HEPARINUNFRC  --   --   --   --   --  0.80* 0.78*  --   --  0.74*  CREATININE 4.32*   < >  --  3.50* 4.37* 3.72*  --  3.06*  --   --    < > = values in this interval not displayed.    Assessment: 48 year old male presenting with NV and fever, h/o afib on warfarin PTA. Patient s/p FFP and vitamin K this admit with supratherapeutic INR and need to place dialysis catheter. Pharmacy consulted to transition to heparin when INR<2. Warfarin held.  2/1 AM update: heparin level remains elevated despite rate increase, no issues per RN.  Goal of Therapy:  Heparin level 0.3-0.7 units/ml Monitor platelets by anticoagulation protocol: Yes   Plan:  -Dec heparin to 950 units/hr -Re-check heparin level at 0900 -Check INR with AM labs to make sure it is below [place], PharmD, [place] Pharmacist Phone: [phone]

## 2018-04-14 LAB — POCT I-STAT 7, (LYTES, BLD GAS, ICA,H+H)
Acid-base deficit: 3 mmol/L — ABNORMAL HIGH (ref 0.0–2.0)
Bicarbonate: 22.9 mmol/L (ref 20.0–28.0)
Calcium, Ion: 1.17 mmol/L (ref 1.15–1.40)
HCT: 43 % (ref 39.0–52.0)
Hemoglobin: 14.6 g/dL (ref 13.0–17.0)
O2 Saturation: 97 %
Patient temperature: 98.8
Potassium: 4.2 mmol/L (ref 3.5–5.1)
Sodium: 135 mmol/L (ref 135–145)
TCO2: 24 mmol/L (ref 22–32)
pCO2 arterial: 41.3 mmHg (ref 32.0–48.0)
pH, Arterial: 7.352 (ref 7.350–7.450)
pO2, Arterial: 92 mmHg (ref 83.0–108.0)

## 2018-04-14 LAB — CBC
HCT: 36 % — ABNORMAL LOW (ref 39.0–52.0)
Hemoglobin: 10.6 g/dL — ABNORMAL LOW (ref 13.0–17.0)
MCH: 28.6 pg (ref 26.0–34.0)
MCHC: 29.4 g/dL — ABNORMAL LOW (ref 30.0–36.0)
MCV: 97.3 fL (ref 80.0–100.0)
NRBC: 0.6 % — AB (ref 0.0–0.2)
PLATELETS: 291 10*3/uL (ref 150–400)
RBC: 3.7 MIL/uL — ABNORMAL LOW (ref 4.22–5.81)
RDW: 16 % — ABNORMAL HIGH (ref 11.5–15.5)
WBC: 17 10*3/uL — ABNORMAL HIGH (ref 4.0–10.5)

## 2018-04-14 LAB — RENAL FUNCTION PANEL
Albumin: 2.3 g/dL — ABNORMAL LOW (ref 3.5–5.0)
Albumin: 2.3 g/dL — ABNORMAL LOW (ref 3.5–5.0)
Anion gap: 13 (ref 5–15)
Anion gap: 14 (ref 5–15)
BUN: 35 mg/dL — ABNORMAL HIGH (ref 6–20)
BUN: 32 mg/dL — ABNORMAL HIGH (ref 6–20)
CO2: 22 mmol/L (ref 22–32)
CO2: 22 mmol/L (ref 22–32)
CREATININE: 3.54 mg/dL — AB (ref 0.61–1.24)
Calcium: 8.9 mg/dL (ref 8.9–10.3)
Calcium: 8.9 mg/dL (ref 8.9–10.3)
Chloride: 100 mmol/L (ref 98–111)
Chloride: 100 mmol/L (ref 98–111)
Creatinine, Ser: 3.46 mg/dL — ABNORMAL HIGH (ref 0.61–1.24)
GFR calc Af Amer: 22 mL/min — ABNORMAL LOW (ref >60)
GFR calc Af Amer: 23 mL/min — ABNORMAL LOW (ref >60)
GFR calc non Af Amer: 19 mL/min — ABNORMAL LOW (ref >60)
GFR, EST NON AFRICAN AMERICAN: 20 mL/min — AB (ref >60)
Glucose, Bld: 152 mg/dL — ABNORMAL HIGH (ref 70–99)
Glucose, Bld: 168 mg/dL — ABNORMAL HIGH (ref 70–99)
Phosphorus: 4.4 mg/dL (ref 2.5–4.6)
Phosphorus: 4.3 mg/dL (ref 2.5–4.6)
Potassium: 4.5 mmol/L (ref 3.5–5.1)
Potassium: 4.1 mmol/L (ref 3.5–5.1)
Sodium: 135 mmol/L (ref 135–145)
Sodium: 136 mmol/L (ref 135–145)

## 2018-04-14 LAB — CULTURE, RESPIRATORY

## 2018-04-14 LAB — GLUCOSE, CAPILLARY
Glucose-Capillary: 130 mg/dL — ABNORMAL HIGH (ref 70–99)
Glucose-Capillary: 132 mg/dL — ABNORMAL HIGH (ref 70–99)
Glucose-Capillary: 144 mg/dL — ABNORMAL HIGH (ref 70–99)
Glucose-Capillary: 150 mg/dL — ABNORMAL HIGH (ref 70–99)
Glucose-Capillary: 150 mg/dL — ABNORMAL HIGH (ref 70–99)
Glucose-Capillary: 158 mg/dL — ABNORMAL HIGH (ref 70–99)

## 2018-04-14 LAB — CULTURE, RESPIRATORY W GRAM STAIN: Culture: NORMAL

## 2018-04-14 LAB — POCT ACTIVATED CLOTTING TIME
Activated Clotting Time: 219 seconds
Activated Clotting Time: 219 seconds

## 2018-04-14 LAB — APTT: aPTT: 51 seconds — ABNORMAL HIGH (ref 24–36)

## 2018-04-14 LAB — PROTIME-INR
INR: 3.23
Prothrombin Time: 32.5 seconds — ABNORMAL HIGH (ref 11.4–15.2)

## 2018-04-14 LAB — MAGNESIUM: Magnesium: 3 mg/dL — ABNORMAL HIGH (ref 1.7–2.4)

## 2018-04-14 NOTE — Progress Notes (Addendum)
[facility] Progress Note    Assessment/ Plan:   1.Acute kidney injury on chronic kidney disease stage IIIb/IV: (baseline creatinine 2.5-2.8) cardiorenal versus ATN and highly likely to have progressed to ESRD at this point.  He remains anuric and we will continue CRRT given hypercatabolic state.  Increase UF goal to net neg 100 mL/ hr given overall net + still during hospitalization.  No heparin in CRRT  2. Acute hypoxic respiratory failure: With evidence of ARDS and preceding viral pneumonia/superimposed HCAP versus aspiration pneumonia- no Zosyn.  Will attempt cautious ultrafiltration for volume unloading/improving oxygenation.  Possibly may need tracheostomy given chronic ventilatory dependency so far- per PCCM  3. Anemia: Slight downtrend trend of hemoglobin/hematocrit noted.  Will start ESA.  4. History of atrial fibrillation/atrial flutter status post DCCV and ablation.  He is currently rate controlled and hemodynamically stable off of pressors.  5. Acute exacerbation of CHF with cardiogenic shock: per primary,   6.  Dispo: remains in ICU   Subjective:    CRRT continues- still + 3L overall but able to pull fluid- hopefully we will achieve net neg balance.  Off pressors   Objective:   BP 108/71   Pulse 85   Temp 98.8 F (37.1 C) (Axillary)   Resp (!) 21   Ht _0  (1.778 m)   Wt 101.5 kg   SpO2 94%   BMI 32.11 kg/m   Intake/Output Summary (Last 24 hours) at 04/14/2018 4010 Last data filed at 04/14/2018 0900 Gross per 24 hour  Intake 969.93 ml  Output 2362 ml  Net -1392.07 ml   Weight change: -3.5 kg  Physical Exam: Gen: ill-appearing, on vent HEENT: ETT and OGT in place, poor dentition CVS:RRR Resp: bilateral inspiratory mechanical BS with crackles throughout Abd: distended, nontender, hypoactive BS Ext: trace LE edema ACCESS: I IJ nontunneled HD cath  Imaging: No results found.  Labs: BMET Recent Labs  Lab 04/11/18 0359  04/11/18 1820 04/12/18 0418 04/12/18 1608 04/12/18 1858 04/13/18 0417 04/13/18 1540 04/13/18 1608 04/14/18 0308  NA 133* 132* 132* 136 134* 136 134* 134* 135  K 4.4 4.7 5.2* 4.9 4.6 4.7 4.8 4.8 4.5  CL 96* 95* 96* 99  --  100 98  --  100  CO2 22 21* 22 23  --  24 21*  --  22  GLUCOSE 194* 208* 170* 165*  --  142* 151*  --  152*  BUN 46* 48* 42* 37*  --  34* 36*  --  35*  CREATININE 3.50* 4.37* 3.72* 3.06*  --  2.83* 3.37*  --  3.54*  CALCIUM 9.2 9.4 9.4 8.7*  --  9.0 8.9  --  8.9  PHOS 4.0 6.8* 5.8* 5.0*  --  4.3 4.7*  --  4.4   CBC Recent Labs  Lab 04/10/18 0428  04/12/18 0418 04/12/18 1858 04/13/18 0417 04/13/18 1608 04/14/18 0308  WBC 13.2*  --  21.2*  --  18.1*  --  17.0*  HGB 12.8*   < > 12.7* 12.6* 11.5* 12.2* 10.6*  HCT 41.4   < > 43.5 37.0* 39.1 36.0* 36.0*  MCV 95.6  --  97.8  --  100.0  --  97.3  PLT 413*  --  392  --  334  --  291   < > = values in this interval not displayed.    Medications:    . albuterol  2.5 mg Nebulization Q6H  . allopurinol  300 mg Per Tube Daily  .  alteplase  4 mg Intracatheter Once  . amiodarone  200 mg Per Tube Daily  . atorvastatin  40 mg Per Tube q1800  . chlorhexidine gluconate (MEDLINE KIT)  15 mL Mouth Rinse BID  . Chlorhexidine Gluconate Cloth  6 each Topical Daily  . docusate  50 mg Per Tube BID  . feeding supplement (PRO-STAT SUGAR FREE 64)  30 mL Per Tube QID  . feeding supplement (VITAL HIGH PROTEIN)  1,000 mL Per Tube Q24H  . haloperidol lactate  5 mg Intravenous Q6H  . insulin aspart  0-15 Units Subcutaneous Q4H  . mouth rinse  15 mL Mouth Rinse 10 times per day  . nystatin  5 mL Oral QID  . pantoprazole sodium  40 mg Per Tube Daily  . polyethylene glycol  17 g Per Tube Daily  . sennosides  5 mL Per Tube BID  . sodium chloride flush  10-40 mL Intracatheter Q12H      Madelon Lips MD [facility] pgr [phone] Cell [phone] 04/14/2018, 9:03 AM

## 2018-04-14 NOTE — Progress Notes (Signed)
 NAME:  Travis Bryan, MRN:  [id number], DOB:  07/12/1970, LOS: 17 ADMISSION DATE:  03/28/2018, CONSULTATION DATE:  03/28/2018 REFERRING MD:  IMTS, CHIEF COMPLAINT:  Respiratory failure.   Brief History   47 year old man present with fever and nausea and vomiting. Increase in weight and dyspnea.Started on BiPAP and antibiotics for suspicion of pneumonia with superimposed decompensated heart failure. PEA arrest on 1/20. Intubated, ROSC achieved after 6 minutes of CPR and Epi x 2.   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 PCCM consulted for persistent hypoxic respiratory failure despite 15 L per minute HFNC and he was transferred to the ICU.   1/20 PEA Arrest (6 minutes), intubated, started on CVVHD  Consults:  CCM  Heart failure  Nephrology   Procedures:  1/20 ETT 1/20 Fem HD Cath   Significant Diagnostic Tests:  None   Micro Data:  1/18 Resp cx - neg (final)  1/17 RVP - metapneumovirus   1/16 blood - negative  1/16 urine culture - neg  MRSA neg   Antimicrobials:  1/17 doxycycline > 1/18  1/17 vanc > 1/18  1/17 cefepime >> dc'd 1/20 1/21 Ceftriaxone >> 1/23 1/31 Zosyn  Interim history/subjective:  No new events overnight  Some intermittent agitation  Follows some simple commands  Several BMs yesterday   Objective   Blood pressure 103/74, pulse 73, temperature 98.7 F (37.1 C), temperature source Axillary, resp. rate 13, height 5' 10" (1.778 m), weight 101.5 kg, SpO2 97 %.    Vent Mode: PRVC FiO2 (%):  [40 %-50 %] 50 % Set Rate:  [22 bmp] 22 bmp Vt Set:  [430 mL] 430 mL PEEP:  [8 cmH20] 8 cmH20 Plateau Pressure:  [18 cmH20-40 cmH20] 18 cmH20   Intake/Output Summary (Last 24 hours) at 04/14/2018 1412 Last data filed at 04/14/2018  1400 Gross per 24 hour  Intake 997.72 ml  Output 2643 ml  Net -1645.28 ml   Filed Weights   04/12/18 0432 04/13/18 0500 04/14/18 0400  Weight: 104 kg 105 kg 101.5 kg   Physical Exam: General: Critically ill male laying in bed, agitated and intubated HENT: [place], AT, ETT in place Eyes: EOMI, no scleral icterus Respiratory: Rhonchi bilaterally Cardiovascular: RRR, -M/R/G, no JVD GI:mild distention, BS +  Extremities:Trace lower extremity edema,-tenderness Neuro: sedated on vent, intermittent agitation, follows some simple commands.  GU: Foley in place  Resolved Hospital Problem list   Acute on chronic systolic CHF with cardiogenic shock  Assessment & Plan:   Moderate ARDS Acute Hypoxic Respiratory Failure, multifactorial Metapneumovirus pneumonia with superimposed bacterial pneumonia complicated by decompensated HF and renal failure. Initially improving and this week with concern for ARDS.  -- P:F 130 -- Wean per ARDSnet protocol. Goal PO2 55-80 -- ABG in am  -- CRRT with UF 100cc/hr for volume removal -- Continue Zosyn  -- Consider for trach candidate due to prolonged vent dependence since 1/20  Acute metabolic encephalopathy Agitation requiring sedation -- RASS goal 0 to -1 -- Fentanyl gtt -- Consider anti-psychotics if QTc appropriate  ?Cholelithiasis Possible Cirrhosis Noted on CT abdomen, no radiographic evidence of cholecystitis. Marked leukocytosis but also with likely HCAP. Alk phos is elevated 213 but normal AST, ALT, and T bili.  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 CRRT with volume removal for ARDS  PEA arrest from respiratory failure 1/20 Hx of NICM s/p ICD, A fib/flutter -- Continue home amiodarone, remains in NSR -- Heparin  gtt on hold    Coagulopathy/Suprtherapeutic -hbg /plt stable  INR tr up 2.5>3.23 , Hep on hold   Paroxysmal Atrial fibrillation -- Heparin on hold -Coagulopathy  - Amio   DM -- q4h SSI-mod  -- CBG q4  Best practice:  Diet: Tube feeds-on hold  DVT prophylaxis: hep on hold , scd  GI prophylaxis: protonix Glucose: SSI q4h  Mobility: Bedrest  Code Status: FULL Family Communication: No family at bedside      NP-C  [facility]  [phone]  04/14/2018      

## 2018-04-15 ENCOUNTER — Inpatient Hospital Stay (HOSPITAL_COMMUNITY): Payer: Medicaid Other

## 2018-04-15 LAB — CBC
HCT: 38.5 % — ABNORMAL LOW (ref 39.0–52.0)
Hemoglobin: 11.3 g/dL — ABNORMAL LOW (ref 13.0–17.0)
MCH: 28.3 pg (ref 26.0–34.0)
MCHC: 29.4 g/dL — ABNORMAL LOW (ref 30.0–36.0)
MCV: 96.5 fL (ref 80.0–100.0)
Platelets: 271 10*3/uL (ref 150–400)
RBC: 3.99 MIL/uL — ABNORMAL LOW (ref 4.22–5.81)
RDW: 16.1 % — ABNORMAL HIGH (ref 11.5–15.5)
WBC: 11.6 10*3/uL — ABNORMAL HIGH (ref 4.0–10.5)
nRBC: 0.3 % — ABNORMAL HIGH (ref 0.0–0.2)

## 2018-04-15 LAB — POCT I-STAT 7, (LYTES, BLD GAS, ICA,H+H)
Acid-base deficit: 1 mmol/L (ref 0.0–2.0)
Bicarbonate: 25.1 mmol/L (ref 20.0–28.0)
Calcium, Ion: 1.19 mmol/L (ref 1.15–1.40)
HCT: 35 % — ABNORMAL LOW (ref 39.0–52.0)
Hemoglobin: 11.9 g/dL — ABNORMAL LOW (ref 13.0–17.0)
O2 Saturation: 97 %
Patient temperature: 98.7
Potassium: 4.3 mmol/L (ref 3.5–5.1)
Sodium: 135 mmol/L (ref 135–145)
TCO2: 27 mmol/L (ref 22–32)
pCO2 arterial: 47.7 mmHg (ref 32.0–48.0)
pH, Arterial: 7.33 — ABNORMAL LOW (ref 7.350–7.450)
pO2, Arterial: 101 mmHg (ref 83.0–108.0)

## 2018-04-15 LAB — GLUCOSE, CAPILLARY
Glucose-Capillary: 139 mg/dL — ABNORMAL HIGH (ref 70–99)
Glucose-Capillary: 108 mg/dL — ABNORMAL HIGH (ref 70–99)
Glucose-Capillary: 135 mg/dL — ABNORMAL HIGH (ref 70–99)
Glucose-Capillary: 153 mg/dL — ABNORMAL HIGH (ref 70–99)
Glucose-Capillary: 146 mg/dL — ABNORMAL HIGH (ref 70–99)

## 2018-04-15 LAB — RENAL FUNCTION PANEL
Albumin: 2.4 g/dL — ABNORMAL LOW (ref 3.5–5.0)
Anion gap: 16 — ABNORMAL HIGH (ref 5–15)
BUN: 31 mg/dL — ABNORMAL HIGH (ref 6–20)
CALCIUM: 9.2 mg/dL (ref 8.9–10.3)
CO2: 22 mmol/L (ref 22–32)
Chloride: 99 mmol/L (ref 98–111)
Creatinine, Ser: 3.42 mg/dL — ABNORMAL HIGH (ref 0.61–1.24)
GFR calc non Af Amer: 20 mL/min — ABNORMAL LOW (ref >60)
GFR, EST AFRICAN AMERICAN: 23 mL/min — AB (ref >60)
Glucose, Bld: 144 mg/dL — ABNORMAL HIGH (ref 70–99)
PHOSPHORUS: 4.5 mg/dL (ref 2.5–4.6)
Potassium: 4.7 mmol/L (ref 3.5–5.1)
Sodium: 137 mmol/L (ref 135–145)

## 2018-04-15 LAB — APTT: aPTT: 64 seconds — ABNORMAL HIGH (ref 24–36)

## 2018-04-15 LAB — MAGNESIUM: Magnesium: 3 mg/dL — ABNORMAL HIGH (ref 1.7–2.4)

## 2018-04-15 LAB — PROTIME-INR
INR: 3.6
Prothrombin Time: 35.4 seconds — ABNORMAL HIGH (ref 11.4–15.2)

## 2018-04-15 MED ORDER — VITAL HIGH PROTEIN PO LIQD
1000.0000 mL | ORAL | Status: DC
  Administered 2018-04-15: 1000 mL

## 2018-04-15 NOTE — Progress Notes (Signed)
NAME:  Travis Bryan, MRN:  [id number], DOB:  Sep 26, 1970, LOS: 23 ADMISSION DATE:  03/28/2018, CONSULTATION DATE:  03/28/2018 REFERRING MD:  Graciella Freer, CHIEF COMPLAINT:  Respiratory failure.   Brief History   48 year old man present with fever and nausea and vomiting. Increase in weight and dyspnea.Started on BiPAP and antibiotics for suspicion of pneumonia with superimposed decompensated heart failure. PEA arrest on 1/20. Intubated, ROSC achieved after 6 minutes of CPR and Epi x 2.   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 PCCM consulted for persistent hypoxic respiratory failure despite 15 L per minute HFNC and he was transferred to the ICU.   1/20 PEA Arrest (6 minutes), intubated, started on CVVHD  Consults:  CCM  Heart failure  Nephrology   Procedures:  1/20 ETT 1/20 Fem HD Cath   Significant Diagnostic Tests:  None   Micro Data:  1/18 Resp cx - neg (final)  1/17 RVP - metapneumovirus   1/16 blood - negative  1/16 urine culture - neg  MRSA neg   Antimicrobials:  1/17 doxycycline > 1/18  1/17 vanc > 1/18  1/17 cefepime >> dc'd 1/20 1/21 Ceftriaxone >> 1/23 1/31 Zosyn  Interim history/subjective:  Weaning PSV 5/5 today  Objective   Blood pressure 97/72, pulse 63, temperature 98.5 F (36.9 C), temperature source Oral, resp. rate (!) 23, height '5\' 10"'  (1.778 m), weight 99.9 kg, SpO2 98 %.    Vent Mode: PRVC FiO2 (%):  [40 %-50 %] 40 % Set Rate:  [22 bmp] 22 bmp Vt Set:  [430 mL] 430 mL PEEP:  [5 cmH20-8 cmH20] 5 cmH20 Plateau Pressure:  [15 cmH20-18 cmH20] 15 cmH20   Intake/Output Summary (Last 24 hours) at 04/15/2018 1125 Last data filed at 04/15/2018 1000 Gross per 24 hour  Intake 870.3 ml  Output 3156 ml  Net -2285.7 ml    Filed Weights   04/13/18 0500 04/14/18 0400 04/15/18 0400  Weight: 105 kg 101.5 kg 99.9 kg   Physical Exam: Gen:      No acute distress HEENT:  EOMI, sclera anicteric, ET tube Neck:     No masses; no thyromegaly Lungs:    Clear to auscultation bilaterally; normal respiratory effort CV:         Regular rate and rhythm; no murmurs Abd:      + bowel sounds; soft, non-tender; no palpable masses, no distension Ext:    No edema; adequate peripheral perfusion Skin:      Warm and dry; no rash Neuro: Sedated, opens eyes to commands  Resolved Hospital Problem list   Acute on chronic systolic CHF with cardiogenic shock  Assessment & Plan:   Moderate ARDS Acute Hypoxic Respiratory Failure, multifactorial Metapneumovirus pneumonia with superimposed bacterial pneumonia complicated by decompensated HF and renal failure. Initially improving and this week with concern for ARDS.  Weaning on pressure support now May need to trach due to prolonged vent dependence but would like to give him a chance at extubation since he is weaning today Continue Zosyn Follow chest x-ray  Acute renal failure, suspected cardiorenal syndrome Continue CRRT for volume removal.  PEA arrest secondary to respiratory failure Cardiogenic shock History of nonischemic cardiomyopathy status post ICD, paroxysmal atrial fibrillation Volume removal with CRRT Heart failure team on board Continue amiodarone  ?Cholelithiasis Possible  Cirrhosis Noted on CT abdomen, no radiographic evidence of cholecystitis. Marked leukocytosis but also with likely HCAP. Alk phos is elevated 213 but normal AST, ALT, and T bili.  Follow LFTs.  Suspected Ileus -improving  CT with no evidence of obstruction   Continue trickle feeds.  Coagulopathy/Suprtherapeutic -hbg /plt stable  INR tr up 2.5>3.60 , Hep on hold   DM SSI coverage.  Best practice:  Diet: Tube feeds-on hold  DVT prophylaxis: hep on hold , scd  GI prophylaxis:  protonix Glucose: SSI q4h  Mobility: Bedrest  Code Status: FULL Family Communication: No family at bedside   The patient is critically ill with multiple organ system failure and requires high complexity decision making for assessment and support, frequent evaluation and titration of therapies, advanced monitoring, review of radiographic studies and interpretation of complex data.   Critical Care Time devoted to patient care services, exclusive of separately billable procedures, described in this note is 35 minutes.   Marshell Garfinkel MD [facility] Pager [phone] If no answer call [phone] 04/15/2018, 12:55 PM

## 2018-04-15 NOTE — Progress Notes (Signed)
Pharmacy Antibiotic Note  Travis Bryan is a 48 y.o. male admitted on 03/28/2018 withmetapneumovirus PNA with superimposed bacterial PNA. Pharmacy has been consulted for Zzosyn dosing. Patient is on CRRT, no recent interruptions noted. WBC down to 11.6, currently AF. TA culture consistent with normal respiratory flora.  Plan: Continue Zosyn 3.375g IV q6h (CRRT dosing) F/u clinical status, renal function, de-escalation, LOT  Height: 5\' 10"  (177.8 cm) Weight: 220 lb 3.8 oz (99.9 kg) IBW/kg (Calculated) : 73  Temp (24hrs), Avg:98.7 F (37.1 C), Min:98 F (36.7 C), Max:99.3 F (37.4 C)  Recent Labs  Lab 04/10/18 0428  04/12/18 0418  04/13/18 0417 04/13/18 1540 04/14/18 0308 04/14/18 1755 04/15/18 0439  WBC 13.2*  --  21.2*  --  18.1*  --  17.0*  --  11.6*  CREATININE 4.32*   < > 3.72*   < > 2.83* 3.37* 3.54* 3.46* 3.42*   < > = values in this interval not displayed.    Estimated Creatinine Clearance: 31.6 mL/min (A) (by C-G formula based on SCr of 3.42 mg/dL (H)).    Allergies  Allergen Reactions  . No Known Allergies     Antimicrobials this admission: 1/31 Zosyn >>   Microbiology results: 1/16 UCx: NG final 1/16 BCx: NG final 1/17 MRSA PCR: negative 1/17 RVP: +metapneumovirus 1/31 TA: normal flora   Thank you for allowing pharmacy to be a part of this patient's care.  Mila Merry Gerarda Fraction, PharmD, [place] PGY2 Infectious Diseases Pharmacy Resident Phone: [phone] 04/15/2018 12:37 PM

## 2018-04-15 NOTE — Progress Notes (Signed)
[facility] Progress Note    Assessment/ Plan:   1.Acute kidney injury on chronic kidney disease stage IIIb/IV: (baseline creatinine 2.5-2.8) cardiorenal versus ATN and highly likely to have progressed to ESRD at this point.  He remains anuric and we will continue CRRT given hypercatabolic state.  Finally net neg- continue for now.  No heparin in CRRT; may require vol removal with CRRT for a couple more days depending if trach/ extubation considered, then likely will need to transition to IHD  2. Acute hypoxic respiratory failure: With evidence of ARDS and preceding viral pneumonia/superimposed HCAP versus aspiration pneumonia- no Zosyn.  Will attempt cautious ultrafiltration for volume unloading/improving oxygenation.  Possibly may need tracheostomy given chronic ventilatory dependency so far- per PCCM  3. Anemia: Slight downtrend trend of hemoglobin/hematocrit noted.  Will start ESA.  4. History of atrial fibrillation/atrial flutter status post DCCV and ablation.  He is currently rate controlled and hemodynamically stable off of pressors.  5. Acute exacerbation of CHF with cardiogenic shock: per primary,   6.  Dispo: remains in ICU   Subjective:    Remains on CRRT, still intubated.     Objective:   BP 97/72   Pulse 63   Temp 98 F (36.7 C) (Oral)   Resp (!) 23   Ht '5\' 10"'  (1.778 m)   Wt 99.9 kg   SpO2 98%   BMI 31.60 kg/m   Intake/Output Summary (Last 24 hours) at 04/15/2018 1028 Last data filed at 04/15/2018 0900 Gross per 24 hour  Intake 1058.14 ml  Output 3246 ml  Net -2187.86 ml   Weight change: -1.6 kg  Physical Exam: Gen: ill-appearing, more awake, getting a bath HEENT: ETT and OGT in place, poor dentition CVS:RRR Resp: bilateral inspiratory mechanical BS with crackles throughout Abd: distended, nontender, hypoactive BS Ext: trace LE edema ACCESS: I IJ nontunneled HD cath  Imaging: Dg Chest Port 1 View  Result Date: 04/15/2018 CLINICAL  DATA:  Respiratory failure EXAM: PORTABLE CHEST 1 VIEW COMPARISON:  04/11/2018 FINDINGS: Chronic cardiomegaly. Single chamber defibrillator lead into the right ventricle. Endotracheal tube tip at the clavicular heads. The orogastric tube reaches the stomach. Left-sided central line with tip at the SVC origin. The right-sided line has been removed since prior Low volume chest with streaky opacity at the bases. No edema, visible effusion, or pneumothorax. IMPRESSION: 1. Stable hardware positioning. 2. Lower lobe atelectasis or infection that is similar to prior. Electronically Signed   By: Monte Fantasia M.D.   On: 04/15/2018 07:19    Labs: BMET Recent Labs  Lab 04/12/18 0418 04/12/18 1608  04/13/18 0417 04/13/18 1540 04/13/18 1608 04/14/18 0308 04/14/18 1024 04/14/18 1755 04/15/18 0435 04/15/18 0439  NA 132* 136   < > 136 134* 134* 135 135 136 135 137  K 5.2* 4.9   < > 4.7 4.8 4.8 4.5 4.2 4.1 4.3 4.7  CL 96* 99  --  100 98  --  100  --  100  --  99  CO2 22 23  --  24 21*  --  22  --  22  --  22  GLUCOSE 170* 165*  --  142* 151*  --  152*  --  168*  --  144*  BUN 42* 37*  --  34* 36*  --  35*  --  32*  --  31*  CREATININE 3.72* 3.06*  --  2.83* 3.37*  --  3.54*  --  3.46*  --  3.42*  CALCIUM 9.4 8.7*  --  9.0 8.9  --  8.9  --  8.9  --  9.2  PHOS 5.8* 5.0*  --  4.3 4.7*  --  4.4  --  4.3  --  4.5   < > = values in this interval not displayed.   CBC Recent Labs  Lab 04/12/18 0418  04/13/18 0417  04/14/18 0308 04/14/18 1024 04/15/18 0435 04/15/18 0439  WBC 21.2*  --  18.1*  --  17.0*  --   --  11.6*  HGB 12.7*   < > 11.5*   < > 10.6* 14.6 11.9* 11.3*  HCT 43.5   < > 39.1   < > 36.0* 43.0 35.0* 38.5*  MCV 97.8  --  100.0  --  97.3  --   --  96.5  PLT 392  --  334  --  291  --   --  271   < > = values in this interval not displayed.    Medications:    . albuterol  2.5 mg Nebulization Q6H  . allopurinol  300 mg Per Tube Daily  . alteplase  4 mg Intracatheter Once  .  amiodarone  200 mg Per Tube Daily  . atorvastatin  40 mg Per Tube q1800  . chlorhexidine gluconate (MEDLINE KIT)  15 mL Mouth Rinse BID  . Chlorhexidine Gluconate Cloth  6 each Topical Daily  . docusate  50 mg Per Tube BID  . feeding supplement (PRO-STAT SUGAR FREE 64)  30 mL Per Tube QID  . feeding supplement (VITAL HIGH PROTEIN)  1,000 mL Per Tube Q24H  . haloperidol lactate  5 mg Intravenous Q6H  . insulin aspart  0-15 Units Subcutaneous Q4H  . mouth rinse  15 mL Mouth Rinse 10 times per day  . nystatin  5 mL Oral QID  . pantoprazole sodium  40 mg Per Tube Daily  . polyethylene glycol  17 g Per Tube Daily  . sennosides  5 mL Per Tube BID  . sodium chloride flush  10-40 mL Intracatheter Q12H      Madelon Lips MD [facility] pgr [phone] Cell [phone] 04/15/2018, 10:28 AM

## 2018-04-16 ENCOUNTER — Inpatient Hospital Stay (HOSPITAL_COMMUNITY): Payer: Medicaid Other

## 2018-04-16 LAB — RENAL FUNCTION PANEL
ANION GAP: 16 — AB (ref 5–15)
ANION GAP: 19 — AB (ref 5–15)
Albumin: 2.5 g/dL — ABNORMAL LOW (ref 3.5–5.0)
Albumin: 2.7 g/dL — ABNORMAL LOW (ref 3.5–5.0)
BUN: 35 mg/dL — ABNORMAL HIGH (ref 6–20)
BUN: 39 mg/dL — ABNORMAL HIGH (ref 6–20)
CO2: 23 mmol/L (ref 22–32)
CO2: 19 mmol/L — ABNORMAL LOW (ref 22–32)
Calcium: 9.2 mg/dL (ref 8.9–10.3)
Calcium: 9.7 mg/dL (ref 8.9–10.3)
Chloride: 97 mmol/L — ABNORMAL LOW (ref 98–111)
Chloride: 98 mmol/L (ref 98–111)
Creatinine, Ser: 3.4 mg/dL — ABNORMAL HIGH (ref 0.61–1.24)
Creatinine, Ser: 3.67 mg/dL — ABNORMAL HIGH (ref 0.61–1.24)
GFR calc Af Amer: 24 mL/min — ABNORMAL LOW (ref >60)
GFR calc Af Amer: 21 mL/min — ABNORMAL LOW (ref >60)
GFR calc non Af Amer: 20 mL/min — ABNORMAL LOW (ref >60)
GFR calc non Af Amer: 19 mL/min — ABNORMAL LOW (ref >60)
Glucose, Bld: 175 mg/dL — ABNORMAL HIGH (ref 70–99)
Glucose, Bld: 169 mg/dL — ABNORMAL HIGH (ref 70–99)
PHOSPHORUS: 4.5 mg/dL (ref 2.5–4.6)
Phosphorus: 3.9 mg/dL (ref 2.5–4.6)
Potassium: 4 mmol/L (ref 3.5–5.1)
Potassium: 3.7 mmol/L (ref 3.5–5.1)
Sodium: 136 mmol/L (ref 135–145)
Sodium: 136 mmol/L (ref 135–145)

## 2018-04-16 LAB — GLUCOSE, CAPILLARY
Glucose-Capillary: 139 mg/dL — ABNORMAL HIGH (ref 70–99)
Glucose-Capillary: 146 mg/dL — ABNORMAL HIGH (ref 70–99)
Glucose-Capillary: 153 mg/dL — ABNORMAL HIGH (ref 70–99)
Glucose-Capillary: 167 mg/dL — ABNORMAL HIGH (ref 70–99)
Glucose-Capillary: 172 mg/dL — ABNORMAL HIGH (ref 70–99)
Glucose-Capillary: 173 mg/dL — ABNORMAL HIGH (ref 70–99)

## 2018-04-16 LAB — CBC
HCT: 39 % (ref 39.0–52.0)
Hemoglobin: 11.6 g/dL — ABNORMAL LOW (ref 13.0–17.0)
MCH: 28.5 pg (ref 26.0–34.0)
MCHC: 29.7 g/dL — ABNORMAL LOW (ref 30.0–36.0)
MCV: 95.8 fL (ref 80.0–100.0)
Platelets: 312 10*3/uL (ref 150–400)
RBC: 4.07 MIL/uL — ABNORMAL LOW (ref 4.22–5.81)
RDW: 16.1 % — ABNORMAL HIGH (ref 11.5–15.5)
WBC: 9.7 10*3/uL (ref 4.0–10.5)
nRBC: 0.2 % (ref 0.0–0.2)

## 2018-04-16 LAB — PROTIME-INR
INR: 3.49
Prothrombin Time: 34.5 seconds — ABNORMAL HIGH (ref 11.4–15.2)

## 2018-04-16 LAB — HEPATIC FUNCTION PANEL
ALBUMIN: 2.8 g/dL — AB (ref 3.5–5.0)
ALT: 27 U/L (ref 0–44)
AST: 42 U/L — ABNORMAL HIGH (ref 15–41)
Alkaline Phosphatase: 197 U/L — ABNORMAL HIGH (ref 38–126)
BILIRUBIN DIRECT: 0.5 mg/dL — AB (ref 0.0–0.2)
BILIRUBIN TOTAL: 1.4 mg/dL — AB (ref 0.3–1.2)
Indirect Bilirubin: 0.9 mg/dL (ref 0.3–0.9)
Total Protein: 10.8 g/dL — ABNORMAL HIGH (ref 6.5–8.1)

## 2018-04-16 LAB — PLATELET COUNT: Platelets: 356 10*3/uL (ref 150–400)

## 2018-04-16 LAB — PHOSPHORUS: Phosphorus: 4.2 mg/dL (ref 2.5–4.6)

## 2018-04-16 LAB — APTT: aPTT: 66 seconds — ABNORMAL HIGH (ref 24–36)

## 2018-04-16 LAB — MAGNESIUM: Magnesium: 3 mg/dL — ABNORMAL HIGH (ref 1.7–2.4)

## 2018-04-16 MED ORDER — VITAMIN K1 10 MG/ML IJ SOLN
10.0000 mg | Freq: Once | INTRAVENOUS | Status: AC
  Administered 2018-04-16: 10 mg via INTRAVENOUS
  Filled 2018-04-16: qty 1

## 2018-04-16 MED ORDER — ORAL CARE MOUTH RINSE
15.0000 mL | Freq: Two times a day (BID) | OROMUCOSAL | Status: DC
  Administered 2018-04-16 – 2018-04-24 (×10): 15 mL via OROMUCOSAL

## 2018-04-16 NOTE — Progress Notes (Signed)
ANTICOAGULATION CONSULT NOTE   Pharmacy Consult for heparin Indication: atrial fibrillation  Patient Measurements: Height: 5\' 10"  (177.8 cm) Weight: 217 lb 6 oz (98.6 kg) IBW/kg (Calculated) : 73  HDW: 94.8 kg  Vital Signs: Temp: 97.5 F (36.4 C) (02/04 0750) Temp Source: Oral (02/04 0750) BP: 111/91 (02/04 1000) Pulse Rate: 79 (02/04 1000)  Labs: Recent Labs    04/14/18 0308  04/14/18 1755 04/15/18 0435 04/15/18 0439 04/16/18 0348  HGB 10.6*   < >  --  11.9* 11.3* 11.6*  HCT 36.0*   < >  --  35.0* 38.5* 39.0  PLT 291  --   --   --  271 312  APTT 51*  --   --   --  64* 66*  LABPROT 32.5*  --   --   --  35.4* 34.5*  INR 3.23  --   --   --  3.60 3.49  CREATININE 3.54*  --  3.46*  --  3.42* 3.40*   < > = values in this interval not displayed.    Assessment: 48 year old male presenting with NV and fever, h/o afib on warfarin PTA. Patient s/p FFP and vitamin K this admit with supratherapeutic INR and need to place dialysis catheter. Pharmacy consulted to transition to heparin when INR<2. Warfarin held.  INR remains elevated at 3.49. Last dose of warfarin was on 1/25. H/H low stable. Plt wnl. No overt s/s of bleeding noted.   Goal of Therapy:  Heparin level 0.3-0.7 units/ml Monitor platelets by anticoagulation protocol: Yes   Plan:  -Continue to hold IV heparin given supratherapeutic INR  -Checking LFTs today   Albertina Parr, PharmD., BCPS Clinical Pharmacist Clinical phone for 04/16/18 until 3:30pm: [phone] If after 3:30pm, please refer to [facility] for unit-specific pharmacist

## 2018-04-16 NOTE — Progress Notes (Signed)
Late entry 04-12-2018  Dilaudid 170mls wasted in sink witnessed by Lenetta Quaker RN

## 2018-04-16 NOTE — Procedures (Signed)
Extubation Procedure Note  Patient Details:   Name: Travis Bryan DOB: Jul 13, 1970 MRN: [id number]   Airway Documentation:    Vent end date: 04/16/18 Vent end time: 1148   Evaluation  O2 sats: stable throughout Complications: No apparent complications Patient did tolerate procedure well. Bilateral Breath Sounds: Rhonchi, Diminished   Yes   Patient extubated to a 4L [place]. Cuff leak was heard. No stridor was noted. RN was at the bedside with RT during extubation.  Renato Gails [facility] 04/16/2018, 11:51 AM

## 2018-04-16 NOTE — Consult Note (Signed)
[place] Nurse wound follow up Patient receiving care in [place].  No family present. Wound type: 5 distinct DTPIs from headband pulse oximeter, purple in color, stable without s/s of worsening. Dressing procedure/placement/frequency: No dressing necessary. Continue to monitor. Monitor the wound area(s) for worsening of condition such as: Signs/symptoms of infection,  Increase in size,  Development of or worsening of odor, Development of pain, or increased pain at the affected locations.  Notify the medical team if any of these develop. Val Riles, RN, MSN, CWOCN, CNS-BC, pager [phone]

## 2018-04-16 NOTE — Progress Notes (Signed)
[facility] Progress Note    Assessment/ Plan:   1.Acute kidney injury on chronic kidney disease stage IIIb/IV: (baseline creatinine 2.5-2.8) cardiorenal versus ATN and highly likely to have progressed to ESRD at this point.  He remains anuric and we will continue CRRT given hypercatabolic state.  Finally net neg- continue for now.  No heparin in CRRT; will do vol removal with CRRT until max tomorrow morning and then see if we can transition to IHD.    2. Acute hypoxic respiratory failure: With evidence of ARDS and preceding viral pneumonia/superimposed HCAP versus aspiration pneumonia- on Zosyn.   Possibly may need tracheostomy given chronic ventilatory dependency so far- per PCCM  3. Anemia: Slight downtrend trend of hemoglobin/hematocrit noted.  Will start ESA.  4. History of atrial fibrillation/atrial flutter status post DCCV and ablation.  He is currently rate controlled and hemodynamically stable off of pressors.  5. Acute exacerbation of CHF with cardiogenic shock: per primary,   6.  Dispo: remains in ICU   Subjective:    Remains on CRRT, still intubated, tolerated PS trial.  Getting net neg.   Objective:   BP (!) 89/73   Pulse 64   Temp (!) 97.5 F (36.4 C) (Oral)   Resp (!) 22   Ht _0  (1.778 m)   Wt 98.6 kg   SpO2 96%   BMI 31.19 kg/m   Intake/Output Summary (Last 24 hours) at 04/16/2018 6295 Last data filed at 04/16/2018 2841 Gross per 24 hour  Intake 1076.48 ml  Output 2886 ml  Net -1809.52 ml   Weight change: -1.3 kg  Physical Exam: Gen: ill-appearing, more awake, NAD HEENT: ETT and OGT in place, poor dentition CVS:RRR Resp: bilateral inspiratory mechanical BS with crackles throughout, improving Abd: distended, nontender, hypoactive BS Ext: trace LE edema ACCESS: I IJ nontunneled HD cath  Imaging: Dg Chest Port 1 View  Result Date: 04/16/2018 CLINICAL DATA:  Acute respiratory failure EXAM: PORTABLE CHEST 1 VIEW COMPARISON:   Yesterday FINDINGS: Endotracheal tube tip at the clavicular heads. The orogastric tube at least reaches the stomach. Single chamber pacer into the right ventricle, stable. Left IJ line with tip at the upper SVC origin. Cardiomegaly. Streaky density at the bases with borderline improvement in aeration. No Kerley lines, effusion, or pneumothorax. IMPRESSION: 1. Stable hardware positioning. 2. Borderline improvement and lower lobe atelectasis/infection. Electronically Signed   By: Monte Fantasia M.D.   On: 04/16/2018 07:17   Dg Chest Port 1 View  Result Date: 04/15/2018 CLINICAL DATA:  Respiratory failure EXAM: PORTABLE CHEST 1 VIEW COMPARISON:  04/11/2018 FINDINGS: Chronic cardiomegaly. Single chamber defibrillator lead into the right ventricle. Endotracheal tube tip at the clavicular heads. The orogastric tube reaches the stomach. Left-sided central line with tip at the SVC origin. The right-sided line has been removed since prior Low volume chest with streaky opacity at the bases. No edema, visible effusion, or pneumothorax. IMPRESSION: 1. Stable hardware positioning. 2. Lower lobe atelectasis or infection that is similar to prior. Electronically Signed   By: Monte Fantasia M.D.   On: 04/15/2018 07:19    Labs: BMET Recent Labs  Lab 04/12/18 1608  04/13/18 0417 04/13/18 1540 04/13/18 1608 04/14/18 0308 04/14/18 1024 04/14/18 1755 04/15/18 0435 04/15/18 0439 04/16/18 0348  NA 136   < > 136 134* 134* 135 135 136 135 137 136  K 4.9   < > 4.7 4.8 4.8 4.5 4.2 4.1 4.3 4.7 4.0  CL 99  --  100 98  --  100  --  100  --  99 97*  CO2 23  --  24 21*  --  22  --  22  --  22 23  GLUCOSE 165*  --  142* 151*  --  152*  --  168*  --  144* 175*  BUN 37*  --  34* 36*  --  35*  --  32*  --  31* 35*  CREATININE 3.06*  --  2.83* 3.37*  --  3.54*  --  3.46*  --  3.42* 3.40*  CALCIUM 8.7*  --  9.0 8.9  --  8.9  --  8.9  --  9.2 9.2  PHOS 5.0*  --  4.3 4.7*  --  4.4  --  4.3  --  4.5 4.2  4.5   < > = values in  this interval not displayed.   CBC Recent Labs  Lab 04/13/18 0417  04/14/18 0308 04/14/18 1024 04/15/18 0435 04/15/18 0439 04/16/18 0348  WBC 18.1*  --  17.0*  --   --  11.6* 9.7  HGB 11.5*   < > 10.6* 14.6 11.9* 11.3* 11.6*  HCT 39.1   < > 36.0* 43.0 35.0* 38.5* 39.0  MCV 100.0  --  97.3  --   --  96.5 95.8  PLT 334  --  291  --   --  271 312   < > = values in this interval not displayed.    Medications:    . albuterol  2.5 mg Nebulization Q6H  . allopurinol  300 mg Per Tube Daily  . alteplase  4 mg Intracatheter Once  . amiodarone  200 mg Per Tube Daily  . atorvastatin  40 mg Per Tube q1800  . chlorhexidine gluconate (MEDLINE KIT)  15 mL Mouth Rinse BID  . Chlorhexidine Gluconate Cloth  6 each Topical Daily  . docusate  50 mg Per Tube BID  . feeding supplement (PRO-STAT SUGAR FREE 64)  30 mL Per Tube QID  . feeding supplement (VITAL HIGH PROTEIN)  1,000 mL Per Tube Q24H  . haloperidol lactate  5 mg Intravenous Q6H  . insulin aspart  0-15 Units Subcutaneous Q4H  . mouth rinse  15 mL Mouth Rinse 10 times per day  . nystatin  5 mL Oral QID  . pantoprazole sodium  40 mg Per Tube Daily  . polyethylene glycol  17 g Per Tube Daily  . sennosides  5 mL Per Tube BID  . sodium chloride flush  10-40 mL Intracatheter Q12H      Madelon Lips MD [facility] pgr [phone] Cell [phone] 04/16/2018, 9:53 AM

## 2018-04-16 NOTE — Progress Notes (Addendum)
NAME:  Travis Bryan, MRN:  [id number], DOB:  [birth year], LOS: 21 ADMISSION DATE:  03/28/2018, CONSULTATION DATE:  03/28/2018 REFERRING MD:  Graciella Freer, CHIEF COMPLAINT:  Respiratory failure.   Brief History   48 year old man present with fever and nausea and vomiting. Increase in weight and dyspnea.Started on BiPAP and antibiotics for suspicion of pneumonia with superimposed decompensated heart failure. PEA arrest on 1/20. Intubated, ROSC achieved after 6 minutes of CPR and Epi x 2.   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 PCCM consulted for persistent hypoxic respiratory failure despite 15 L per minute HFNC and he was transferred to the ICU.   1/20 PEA Arrest (6 minutes), intubated, started on CVVHD  Consults:  CCM  Heart failure  Nephrology   Procedures:  1/20 ETT 1/20 Fem HD Cath   Significant Diagnostic Tests:  None   Micro Data:  1/18 Resp cx - neg (final)  1/17 RVP - metapneumovirus   1/16 blood - negative  1/16 urine culture - neg  MRSA neg   Antimicrobials:  1/17 doxycycline > 1/18  1/17 vanc > 1/18  1/17 cefepime >> dc'd 1/20 1/21 Ceftriaxone >> 1/23 1/31 Zosyn  Interim history/subjective:  Weaning PSV 5/5 today  Objective   Blood pressure (!) 89/73, pulse 64, temperature (!) 97.5 F (36.4 C), temperature source Oral, resp. rate (!) 22, height '5\' 10"'  (1.778 m), weight 98.6 kg, SpO2 96 %.    Vent Mode: PSV;CPAP FiO2 (%):  [40 %] 40 % Set Rate:  [22 bmp] 22 bmp Vt Set:  [430 mL] 430 mL PEEP:  [5 cmH20] 5 cmH20 Pressure Support:  [5 cmH20-10 cmH20] 10 cmH20 Plateau Pressure:  [10 cmH20-18 cmH20] 18 cmH20   Intake/Output Summary (Last 24 hours) at 04/16/2018 0954 Last data filed at 04/16/2018 2800 Gross per 24 hour  Intake  1076.48 ml  Output 2886 ml  Net -1809.52 ml   Filed Weights   04/14/18 0400 04/15/18 0400 04/16/18 0500  Weight: 101.5 kg 99.9 kg 98.6 kg   Physical Exam: General: Appears older than stated age of 23 HEENT: Endotracheal tube orogastric tube in place Neuro: Dull effect but does follow some commands CV: Sounds are regular PULM: even/non-labored, lungs bilaterally diminished throughout LK:JZPH, non-tender, bsx4 active  Extremities: warm/dry, 2+ edema  Skin: no rashes or lesions   Resolved Hospital Problem list   Acute on chronic systolic CHF with cardiogenic shock  Assessment & Plan:   Moderate ARDS Acute Hypoxic Respiratory Failure, multifactorial Metapneumovirus pneumonia with superimposed bacterial pneumonia complicated by decompensated HF and renal failure. Initially improving and this week with concern for ARDS.  Wean as tolerated Trial extubation 2/4 May need trach in future. Note teeth        Acute renal failure, suspected cardiorenal syndrome Continue CRRT for volume removal.  PEA arrest secondary to respiratory failure Cardiogenic shock History of nonischemic cardiomyopathy status post ICD, paroxysmal atrial fibrillation Negative volume status with CRRT Heart failure team is following Continue amiodarone   ?Cholelithiasis Possible Cirrhosis Noted on CT abdomen, no radiographic evidence of cholecystitis. Marked leukocytosis but also with likely HCAP. Alk phos is elevated 213 but normal AST, ALT, and T bili.  Recheck LFTs 04/16/2018  Suspected Ileus -improving  Continue trickle feeding as tolerated  Coagulopathy/Suprtherapeutic -hbg /plt stable  Lab Results  Component Value Date   INR 3.49 04/16/2018   INR 3.60 04/15/2018   INR 3.23 04/14/2018    Continue to hold heparin with INR 3.49 Continue to monitor INR Monitor LFTs  DM CBG (last 3)  Recent Labs    04/16/18 0000 04/16/18 0427 04/16/18 0748  GLUCAP 173* 153* 139*    Sliding scale  insulin coverage  Best practice:  Diet: Tube feeds-on hold  DVT prophylaxis: hep on hold , scd  GI prophylaxis: protonix Glucose: SSI q4h  Mobility: Bedrest  Code Status: FULL Family Communication: 04/16/2018 no family at bedside   App CCT 30 min  Richardson Landry Nakeshia Waldeck ACNP [facility] PCCM Pager [phone] till 1 pm If no answer page [phone]- [phone] 04/16/2018, 9:54 AM

## 2018-04-17 ENCOUNTER — Inpatient Hospital Stay (HOSPITAL_COMMUNITY): Payer: Medicaid Other

## 2018-04-17 ENCOUNTER — Encounter: Payer: Self-pay | Admitting: Internal Medicine

## 2018-04-17 DIAGNOSIS — L8990 Pressure ulcer of unspecified site, unspecified stage: Secondary | ICD-10-CM

## 2018-04-17 LAB — RENAL FUNCTION PANEL
Albumin: 2.6 g/dL — ABNORMAL LOW (ref 3.5–5.0)
Albumin: 2.7 g/dL — ABNORMAL LOW (ref 3.5–5.0)
Anion gap: 22 — ABNORMAL HIGH (ref 5–15)
Anion gap: 24 — ABNORMAL HIGH (ref 5–15)
BUN: 58 mg/dL — AB (ref 6–20)
BUN: 70 mg/dL — ABNORMAL HIGH (ref 6–20)
CO2: 19 mmol/L — ABNORMAL LOW (ref 22–32)
CO2: 15 mmol/L — ABNORMAL LOW (ref 22–32)
CREATININE: 5.99 mg/dL — AB (ref 0.61–1.24)
Calcium: 9.9 mg/dL (ref 8.9–10.3)
Calcium: 10 mg/dL (ref 8.9–10.3)
Chloride: 95 mmol/L — ABNORMAL LOW (ref 98–111)
Chloride: 96 mmol/L — ABNORMAL LOW (ref 98–111)
Creatinine, Ser: 7.03 mg/dL — ABNORMAL HIGH (ref 0.61–1.24)
GFR calc Af Amer: 12 mL/min — ABNORMAL LOW (ref >60)
GFR calc Af Amer: 10 mL/min — ABNORMAL LOW (ref >60)
GFR calc non Af Amer: 10 mL/min — ABNORMAL LOW (ref >60)
GFR calc non Af Amer: 8 mL/min — ABNORMAL LOW (ref >60)
GLUCOSE: 173 mg/dL — AB (ref 70–99)
Glucose, Bld: 225 mg/dL — ABNORMAL HIGH (ref 70–99)
Phosphorus: 5.4 mg/dL — ABNORMAL HIGH (ref 2.5–4.6)
Phosphorus: 5.7 mg/dL — ABNORMAL HIGH (ref 2.5–4.6)
Potassium: 3.6 mmol/L (ref 3.5–5.1)
Potassium: 3.7 mmol/L (ref 3.5–5.1)
Sodium: 136 mmol/L (ref 135–145)
Sodium: 135 mmol/L (ref 135–145)

## 2018-04-17 LAB — CBC
HCT: 41.6 % (ref 39.0–52.0)
Hemoglobin: 12.9 g/dL — ABNORMAL LOW (ref 13.0–17.0)
MCH: 29.3 pg (ref 26.0–34.0)
MCHC: 31 g/dL (ref 30.0–36.0)
MCV: 94.5 fL (ref 80.0–100.0)
Platelets: 354 10*3/uL (ref 150–400)
RBC: 4.4 MIL/uL (ref 4.22–5.81)
RDW: 16.2 % — AB (ref 11.5–15.5)
WBC: 10.3 10*3/uL (ref 4.0–10.5)
nRBC: 0.4 % — ABNORMAL HIGH (ref 0.0–0.2)

## 2018-04-17 LAB — GLUCOSE, CAPILLARY
Glucose-Capillary: 157 mg/dL — ABNORMAL HIGH (ref 70–99)
Glucose-Capillary: 160 mg/dL — ABNORMAL HIGH (ref 70–99)
Glucose-Capillary: 162 mg/dL — ABNORMAL HIGH (ref 70–99)
Glucose-Capillary: 172 mg/dL — ABNORMAL HIGH (ref 70–99)
Glucose-Capillary: 225 mg/dL — ABNORMAL HIGH (ref 70–99)
Glucose-Capillary: 229 mg/dL — ABNORMAL HIGH (ref 70–99)

## 2018-04-17 LAB — PROTIME-INR
INR: 1.39
Prothrombin Time: 16.9 seconds — ABNORMAL HIGH (ref 11.4–15.2)

## 2018-04-17 LAB — HEPARIN LEVEL (UNFRACTIONATED): Heparin Unfractionated: 0.12 IU/mL — ABNORMAL LOW (ref 0.30–0.70)

## 2018-04-17 MED ORDER — LIDOCAINE-PRILOCAINE 2.5-2.5 % EX CREA
1.0000 "application " | TOPICAL_CREAM | CUTANEOUS | Status: DC | PRN
  Filled 2018-04-17: qty 5

## 2018-04-17 MED ORDER — HEPARIN (PORCINE) 25000 UT/250ML-% IV SOLN
1450.0000 [IU]/h | INTRAVENOUS | Status: DC
  Administered 2018-04-17: 1000 [IU]/h via INTRAVENOUS
  Administered 2018-04-19 – 2018-04-21 (×3): 1200 [IU]/h via INTRAVENOUS
  Administered 2018-04-22: 1300 [IU]/h via INTRAVENOUS
  Administered 2018-04-22: 1200 [IU]/h via INTRAVENOUS
  Administered 2018-04-23 – 2018-04-25 (×3): 1450 [IU]/h via INTRAVENOUS
  Filled 2018-04-17 (×10): qty 250

## 2018-04-17 MED ORDER — SODIUM CHLORIDE 0.9 % IV SOLN: 100.0000 mL | INTRAVENOUS | Status: DC | PRN

## 2018-04-17 MED ORDER — INSULIN ASPART 100 UNIT/ML ~~LOC~~ SOLN
0.0000 [IU] | Freq: Three times a day (TID) | SUBCUTANEOUS | Status: DC
  Administered 2018-04-17: 2 [IU] via SUBCUTANEOUS
  Administered 2018-04-17: 3 [IU] via SUBCUTANEOUS
  Administered 2018-04-18: 5 [IU] via SUBCUTANEOUS
  Administered 2018-04-18: 2 [IU] via SUBCUTANEOUS
  Administered 2018-04-19 (×2): 5 [IU] via SUBCUTANEOUS
  Administered 2018-04-19: 3 [IU] via SUBCUTANEOUS
  Administered 2018-04-20 – 2018-04-21 (×2): 5 [IU] via SUBCUTANEOUS
  Administered 2018-04-21: 3 [IU] via SUBCUTANEOUS
  Administered 2018-04-21: 5 [IU] via SUBCUTANEOUS
  Administered 2018-04-22 (×2): 7 [IU] via SUBCUTANEOUS
  Administered 2018-04-22: 3 [IU] via SUBCUTANEOUS
  Administered 2018-04-23: 2 [IU] via SUBCUTANEOUS
  Administered 2018-04-23 (×2): 5 [IU] via SUBCUTANEOUS
  Administered 2018-04-24: 3 [IU] via SUBCUTANEOUS
  Administered 2018-04-24: 5 [IU] via SUBCUTANEOUS
  Administered 2018-04-24 – 2018-04-25 (×2): 2 [IU] via SUBCUTANEOUS
  Administered 2018-04-26: 3 [IU] via SUBCUTANEOUS

## 2018-04-17 MED ORDER — HEPARIN SODIUM (PORCINE) 1000 UNIT/ML DIALYSIS
1000.0000 [IU] | INTRAMUSCULAR | Status: DC | PRN
  Administered 2018-04-23: 1000 [IU] via INTRAVENOUS_CENTRAL
  Filled 2018-04-17: qty 1

## 2018-04-17 MED ORDER — LIDOCAINE HCL (PF) 1 % IJ SOLN: 5.0000 mL | INTRAMUSCULAR | Status: DC | PRN

## 2018-04-17 MED ORDER — RESOURCE THICKENUP CLEAR PO POWD
ORAL | Status: DC | PRN
  Filled 2018-04-17: qty 125

## 2018-04-17 MED ORDER — CHLORHEXIDINE GLUCONATE CLOTH 2 % EX PADS
6.0000 | MEDICATED_PAD | Freq: Every day | CUTANEOUS | Status: DC
  Administered 2018-04-18 – 2018-04-21 (×4): 6 via TOPICAL

## 2018-04-17 MED ORDER — PENTAFLUOROPROP-TETRAFLUOROETH EX AERO: 1.0000 "application " | INHALATION_SPRAY | CUTANEOUS | Status: DC | PRN

## 2018-04-17 NOTE — Progress Notes (Signed)
[place] for Warfarin/Heparin Indication: atrial fibrillation  Patient Measurements: Height: 5\' 10"  (177.8 cm) Weight: 208 lb 15.9 oz (94.8 kg) IBW/kg (Calculated) : 73  Vital Signs: Temp: 97.7 F (36.5 C) (02/05 1642) Temp Source: Oral (02/05 1642) BP: 92/63 (02/05 1708) Pulse Rate: 83 (02/05 1708)  Labs: Recent Labs    04/15/18 0439 04/16/18 0348 04/16/18 1224 04/16/18 1547 04/17/18 0658 04/17/18 1725 04/17/18 1730  HGB 11.3* 11.6*  --   --  12.9*  --   --   HCT 38.5* 39.0  --   --  41.6  --   --   PLT 271 312 356  --  354  --   --   APTT 64* 66*  --   --   --   --   --   LABPROT 35.4* 34.5*  --   --  16.9*  --   --   INR 3.60 3.49  --   --  1.39  --   --   HEPARINUNFRC  --   --   --   --   --  0.12*  --   CREATININE 3.42* 3.40*  --  3.67* 5.99*  --  7.03*    Assessment: 48 year old male presenting with NV and fever, h/o afib on warfarin PTA.   Heparin has been held for multiple days d/t elevated INR. Now s/p vit K and INR this am 1.39. IV heparin resumed and currently at 1000 units/hr. Initial HL this evening is subtherapeutic. No s/s of bleeding per RN.    Goal of Therapy:  Heparin level 0.3-0.7 units/ml Monitor platelets by anticoagulation protocol: Yes   Plan:  Increase heparin to 1250 units/hr F/u AM heparin level  Daily HL CBC INR  Albertina Parr, PharmD., BCPS Clinical Pharmacist Clinical phone for 04/17/18 until 10:30pm: [phone] If after 10:30pm, please refer to [facility] for unit-specific pharmacist

## 2018-04-17 NOTE — Progress Notes (Signed)
Modified Barium Swallow Progress Note  Patient Details  Name: Travis Bryan MRN: [id number] Date of Birth: Aug 03, 1970  Today's Date: 04/17/2018  Modified Barium Swallow completed.  Full report located under Chart Review in the Imaging Section.  Brief recommendations include the following:  Clinical Impression  Pt demonstrates moderate oropharyngeal dysphagia with sensory deficits and decreased laryngeal closure leading to silent aspriation before and during the swallow with liquids thinner than honey. Decreasing nectar thick bolus to teaspoon size to contain bolus in the valleculae prior to swallow initiation is also a helpful strategy, Otherwise, liquids spill into the vestibule and fall past the cords before/during the swallow with intermittent delayed cough. Honey thick liquids tolerated with penetration or very trace aspiration even if bolus quite large. Cued throat clear ejects material. Oral phase prolonged due to poor dentition but may be baseline. Given pts deconditioning and high risk for fatigue will initaite a dys 2 diet with honey thick liquids. As mentation and generalized strength improve and vocal quality become clearer would expect improvement in timing and airway protection. In the meantime will train pt in small sips and throat clearing as compensatory strategies.    Swallow Evaluation Recommendations       SLP Diet Recommendations: Dysphagia 2 (Fine chop) solids;Honey thick liquids   Liquid Administration via: Cup   Medication Administration: Whole meds with puree   Supervision: Staff to assist with self feeding;Full supervision/cueing for compensatory strategies   Compensations: Slow rate;Small sips/bites;Clear throat after each swallow   Postural Changes: Seated upright at 90 degrees   Oral Care Recommendations: Oral care BID       Herbie Baltimore, MA [place] Pager [phone] Office [phone]  Lynann Beaver 04/17/2018,11:59 AM

## 2018-04-17 NOTE — Progress Notes (Addendum)
Nutrition Follow-up  DOCUMENTATION CODES:   Not applicable  INTERVENTION:    Magic cup TID with meals, each supplement provides 290 kcal and 9 grams of protein  NUTRITION DIAGNOSIS:   Inadequate oral intake related to inability to eat as evidenced by NPO status.  Ongoing   GOAL:   Patient will meet greater than or equal to 90% of their needs  Unmet  MONITOR:   Diet advancement, PO intake, Skin  ASSESSMENT:   48 yo male with PMH of CHF, NICM, HLD, HTN, obesity, AICD, DM-2, and A fib who was admitted with PNA with decompensated HF. S/P PEA arrest on 1/20, requiring CPR x 6 minutes and intubation.  Extubated 2/4. TF off since extubation. Off CRRT. S/P MBS with SLP today. Diet advancing to Dysphagia 3 with honey thick liquids. Weight down with negative fluid balance. I/O -8.5 L since admission Labs and medications reviewed.  Creatinine 5.99 (H), BUN 58 (H), phosphorus 5.4 (H) CBG's: [phone]  Diet Order:   Diet Order            DIET DYS 2 Room service appropriate? Yes; Fluid consistency: Honey Thick  Diet effective now              EDUCATION NEEDS:   No education needs have been identified at this time  Skin:  Skin Assessment: (MASD partial thickness wounds to L buttocks & gluteal cleft)  Last BM:  2/4  Height:   Ht Readings from Last 1 Encounters:  04/11/18 5\' 10"  (1.778 m)    Weight:   Wt Readings from Last 1 Encounters:  04/17/18 94.8 kg    Ideal Body Weight:  75.5 kg  BMI:  Body mass index is 29.99 kg/m.  Estimated Nutritional Needs:   Kcal:  2050-2250  Protein:  105-120 gm  Fluid:  2-2.3 L    Molli Barrows, RD, LDN, CNSC Pager [phone] After Hours Pager [phone]

## 2018-04-17 NOTE — Progress Notes (Addendum)
NAME:  Travis Bryan, MRN:  [id number], DOB:  15-Jun-1970, LOS: 64 ADMISSION DATE:  03/28/2018, CONSULTATION DATE:  03/28/2018 REFERRING MD:  Graciella Freer, CHIEF COMPLAINT:  Respiratory failure.   Brief History   48 year old man present with fever and nausea and vomiting. Increase in weight and dyspnea.Started on BiPAP and antibiotics for suspicion of pneumonia with superimposed decompensated heart failure. PEA arrest on 1/20. Intubated, ROSC achieved after 6 minutes of CPR and Epi x 2.   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 PCCM consulted for persistent hypoxic respiratory failure despite 15 L per minute HFNC and he was transferred to the ICU.   1/20 PEA Arrest (6 minutes), intubated, started on CVVHD  Consults:  CCM  Heart failure  Nephrology   Procedures:  1/20 ETT>>2/4 1/20 Fem HD Cath out 2/1 lt ij hd>>  Significant Diagnostic Tests:  None   Micro Data:  1/18 Resp cx - neg (final)  1/17 RVP - metapneumovirus   1/16 blood - negative  1/16 urine culture - neg  MRSA neg   Antimicrobials:  1/17 doxycycline > 1/18  1/17 vanc > 1/18  1/17 cefepime >> dc'd 1/20 1/21 Ceftriaxone >> 1/23 1/31 Zosyn>> 04/17/2018  Interim history/subjective:  24 hours off ventilatory support  Objective   Blood pressure 106/80, pulse 72, temperature 98.3 F (36.8 C), temperature source Oral, resp. rate 20, height '5\' 10"'  (1.778 m), weight 94.8 kg, SpO2 96 %.    Vent Mode: PSV;CPAP FiO2 (%):  [40 %] 40 % PEEP:  [5 cmH20] 5 cmH20 Pressure Support:  [5 cmH20] 5 cmH20   Intake/Output Summary (Last 24 hours) at 04/17/2018 0930 Last data filed at 04/17/2018 0700 Gross per 24 hour  Intake 428.73 ml  Output 461 ml  Net -32.27 ml   Filed Weights   04/15/18 0400  04/16/18 0500 04/17/18 0400  Weight: 99.9 kg 98.6 kg 94.8 kg   Physical Exam: General: Adult male dull effect but follows commands HEENT: Extremely poor dentition Neuro: Dull effect with follows commands all extremities  CV: s1s2 rrr, no m/r/g PULM: even/non-labored, lungs bilaterally diminished in the bases AQ:TMAU, non-tender, bsx4 active  Extremities: warm/dry, plus edema  Skin: Sacral ulcer size of a nickel with dressing intact    Resolved Hospital Problem list   Acute on chronic systolic CHF with cardiogenic shock Vent dependent respiratory failure  Assessment & Plan:   Moderate ARDS Acute Hypoxic Respiratory Failure, multifactorial Metapneumovirus pneumonia with superimposed bacterial pneumonia complicated by decompensated HF and renal failure. Initially improving and this week with concern for extubated 04/16/2018 No acute distress O2 as needed Pulmonary toilet Transfer to stepdown unit Stopped Zosyn 04/17/2018     Acute renal failure, suspected cardiorenal syndrome CRRT has been discontinued Further dialysis per nephrology  PEA arrest secondary to respiratory failure Cardiogenic shock History of nonischemic cardiomyopathy status post ICD, paroxysmal atrial fibrillation Negative volume status with CRRT Heart failure team is following Continue amiodarone po Continue heparin drip and resume coumadin per pharmacy   ?Cholelithiasis Possible Cirrhosis Noted on CT abdomen, no radiographic evidence of cholecystitis. Marked leukocytosis but also with likely HCAP. Alk phos is elevated 213 but normal AST, ALT, and T bili.  LFTs rechecked on 04/16/2018 alkaline phosphate 197, AST 42 Continue to monitor  Suspected Ileus -improving  Advance diet  Coagulopathy/Suprtherapeutic -hbg /plt stable  Lab Results  Component Value Date   INR 1.39 04/17/2018   INR 3.49 04/16/2018   INR 3.60 04/15/2018    He was treated with vitamin K x1 in anticipation of tracheostomy  tracheostomy is not needed therefore he will resume Coumadin as previously.  DM CBG (last 3)  Recent Labs    04/17/18 0009 04/17/18 0355 04/17/18 0742  GLUCAP 160* 162* 157*    Sliding scale insulin coverage  Best practice:  Diet: Advance diet DVT prophylaxis: hep on hold , scd  GI prophylaxis: protonix Glucose: SSI before meals and at bedtime Mobility: Mobilize Code Status: FULL Family Communication: 04/17/2018 patient updated at bedside     [place] PCCM Pager [phone] till 1 pm If no answer page 336- [phone] 04/17/2018, 9:30 AM

## 2018-04-17 NOTE — Progress Notes (Signed)
Rehab Admissions Coordinator Note:  Per PT recommendation, this patient was screened by Jhonnie Garner for appropriateness for an Inpatient Acute Rehab Consult.  At this time, we are recommending Inpatient Rehab consult. AC will contact MD regarding request for IP Rehab Consult Order.   Jhonnie Garner 04/17/2018, 4:43 PM  I can be reached at [phone].

## 2018-04-17 NOTE — Consult Note (Signed)
[facility] Nurse wound consult note Reason for Consult:gluteal cleft and left buttock partial thickness wounds.  Wound type:MASD intertriginous and incont related Pressure Injury POA: NA Measurement:gluteal cleft is 6cm x 1.5cm x 0.1cm red wound bed, moist, no odor. Left buttock wound is 4cm x 3cm x 0.1cm with pink wound bed, no drainage or odor noted. Wound bed:see above Drainage (amount, consistency, odor) see above Periwound: intact Dressing procedure/placement/frequency:I have provided nurses with orders for foam to both sites. Placed and instructed bedside nurse in applying gluteal cleft foam folded like a taco to keep buttocks separated. We will not follow, but will remain available to this patient, to nursing, and the medical and/or surgical teams.  Please re-consult if we need to assist further.  Fara Olden, RN-C, WTA-C, [facility] Wound Treatment Associate Ostomy Care Associate

## 2018-04-17 NOTE — Progress Notes (Signed)
[place] for Warfarin/Heparin Indication: atrial fibrillation  Patient Measurements: Height: 5\' 10"  (177.8 cm) Weight: 208 lb 15.9 oz (94.8 kg) IBW/kg (Calculated) : 73  Vital Signs: Temp: 98.3 F (36.8 C) (02/05 0700) Temp Source: Oral (02/05 0700) BP: 106/80 (02/05 0700) Pulse Rate: 72 (02/05 0700)  Labs: Recent Labs    04/15/18 0435 04/15/18 0439 04/16/18 0348 04/16/18 1224 04/16/18 1547 04/17/18 0658  HGB 11.9* 11.3* 11.6*  --   --   --   HCT 35.0* 38.5* 39.0  --   --   --   PLT  --  271 312 356  --   --   APTT  --  64* 66*  --   --   --   LABPROT  --  35.4* 34.5*  --   --  16.9*  INR  --  3.60 3.49  --   --  1.39  CREATININE  --  3.42* 3.40*  --  3.67*  --     Assessment: 48 year old male presenting with NV and fever, h/o afib on warfarin PTA.   Heparin has been held for multiple days d/t elevated INR. Now s/p vit K  INR this am 1.39  H/H and Plt stable. No bleeding noted.   PTA warfarin dose: 7.5 mg MWF, 5 mg TTSS  Goal of Therapy:  INR 2-3 Monitor platelets by anticoagulation protocol: Yes   Plan:  Resume heparin 1000 units/hr 1600 HL Daily HL CBC INR F/U plans to return to warfarin  Levester Fresh, PharmD, BCPS, BCCCP Clinical Pharmacist [phone]  Please check AMION for all [place] numbers  04/17/2018 7:47 AM

## 2018-04-17 NOTE — Progress Notes (Signed)
[facility] Progress Note    Assessment/ Plan:   1.Acute kidney injury on chronic kidney disease stage IIIb/IV: (baseline creatinine 2.5-2.8) cardiorenal versus ATN and highly likely to have progressed to ESRD at this point. S/p CRRT- came off2/4.  Plan for IHD tomorrow.  Bladder scan today.  2. Acute hypoxic respiratory failure: With evidence of ARDS and preceding viral pneumonia/superimposed HCAP versus aspiration pneumonia- on Zosyn.   Possibly may need tracheostomy given chronic ventilatory dependency so far- per PCCM S/p extubation!  3. Anemia: Slight downtrend trend of hemoglobin/hematocrit noted.  Will start ESA.  4. History of atrial fibrillation/atrial flutter status post DCCV and ablation.  He is currently rate controlled and hemodynamically stable off of pressors.  5. Acute exacerbation of CHF with cardiogenic shock: per primary,   6.  Dispo: remains in ICU   Subjective:    Extubated and off CRRT!  No UOP yet.   Objective:   BP 106/80   Pulse 72   Temp 98.3 F (36.8 C) (Oral)   Resp 20   Ht 5\' 10"  (1.778 m)   Wt 94.8 kg   SpO2 96%   BMI 29.99 kg/m   Intake/Output Summary (Last 24 hours) at 04/17/2018 1054 Last data filed at 04/17/2018 0700 Gross per 24 hour  Intake 392.73 ml  Output 357 ml  Net 35.73 ml   Weight change: -3.8 kg  Physical Exam: Gen: ill-appearing, more awake, NAD HEENT: on [place] O2 CVS:RRR Resp: bilateral anteriorly clar Abd: distended, nontender, hypoactive BS Ext: trace LE edema ACCESS: I IJ nontunneled HD cath  Imaging: Dg Chest Port 1 View  Result Date: 04/17/2018 CLINICAL DATA:  Respiratory failure EXAM: PORTABLE CHEST 1 VIEW COMPARISON:  Yesterday FINDINGS: Single chamber ICD lead into the right ventricle. Low volume chest but improved inflation and decreasing atelectasis. Left IJ line in stable position. The trachea and esophagus have been extubated. IMPRESSION: Improved inflation after extubation. Lung volumes  remain low and there is still lower lobe atelectasis. Electronically Signed   By: Monte Fantasia M.D.   On: 04/17/2018 08:32   Dg Chest Port 1 View  Result Date: 04/16/2018 CLINICAL DATA:  Acute respiratory failure EXAM: PORTABLE CHEST 1 VIEW COMPARISON:  Yesterday FINDINGS: Endotracheal tube tip at the clavicular heads. The orogastric tube at least reaches the stomach. Single chamber pacer into the right ventricle, stable. Left IJ line with tip at the upper SVC origin. Cardiomegaly. Streaky density at the bases with borderline improvement in aeration. No Kerley lines, effusion, or pneumothorax. IMPRESSION: 1. Stable hardware positioning. 2. Borderline improvement and lower lobe atelectasis/infection. Electronically Signed   By: Monte Fantasia M.D.   On: 04/16/2018 07:17    Labs: BMET Recent Labs  Lab 04/13/18 1540  04/14/18 0308 04/14/18 1024 04/14/18 1755 04/15/18 0435 04/15/18 0439 04/16/18 0348 04/16/18 1547 04/17/18 0658  NA 134*   < > 135 135 136 135 137 136 136 136  K 4.8   < > 4.5 4.2 4.1 4.3 4.7 4.0 3.7 3.6  CL 98  --  100  --  100  --  99 97* 98 95*  CO2 21*  --  22  --  22  --  22 23 19* 19*  GLUCOSE 151*  --  152*  --  168*  --  144* 175* 169* 173*  BUN 36*  --  35*  --  32*  --  31* 35* 39* 58*  CREATININE 3.37*  --  3.54*  --  3.46*  --  3.42* 3.40* 3.67* 5.99*  CALCIUM 8.9  --  8.9  --  8.9  --  9.2 9.2 9.7 9.9  PHOS 4.7*  --  4.4  --  4.3  --  4.5 4.2  4.5 3.9 5.4*   < > = values in this interval not displayed.   CBC Recent Labs  Lab 04/14/18 0308  04/15/18 0435 04/15/18 0439 04/16/18 0348 04/16/18 1224 04/17/18 0658  WBC 17.0*  --   --  11.6* 9.7  --  10.3  HGB 10.6*   < > 11.9* 11.3* 11.6*  --  12.9*  HCT 36.0*   < > 35.0* 38.5* 39.0  --  41.6  MCV 97.3  --   --  96.5 95.8  --  94.5  PLT 291  --   --  271 312 356 354   < > = values in this interval not displayed.    Medications:    . albuterol  2.5 mg Nebulization Q6H  . allopurinol  300 mg Per  Tube Daily  . alteplase  4 mg Intracatheter Once  . amiodarone  200 mg Per Tube Daily  . atorvastatin  40 mg Per Tube q1800  . Chlorhexidine Gluconate Cloth  6 each Topical Daily  . docusate  50 mg Per Tube BID  . insulin aspart  0-9 Units Subcutaneous TID WC  . mouth rinse  15 mL Mouth Rinse BID  . nystatin  5 mL Oral QID  . pantoprazole sodium  40 mg Per Tube Daily  . polyethylene glycol  17 g Per Tube Daily  . sennosides  5 mL Per Tube BID  . sodium chloride flush  10-40 mL Intracatheter Q12H      Madelon Lips MD [facility] pgr [phone] Cell [phone] 04/17/2018, 10:54 AM

## 2018-04-17 NOTE — Evaluation (Signed)
Occupational Therapy Evaluation Patient Details Name: Travis Bryan MRN: [id number] DOB: [birth year] Today's Date: 04/17/2018    History of Present Illness 48 y.o. male admitted with pneumonia and decompensated heart failure. PEA arrest on 1/20, intubated 1/20-2/04.  PMH includes but not limited to: HTN, HLD, CKD III, A fib on coumadin, AICD placement, MI, DM2.    Clinical Impression   Per pt report, pt was living with his two aunts and was independent; unsure of reliability due to decreased answering of PLOF and home questions. Pt currently requiring Min Guard A for UB ADLs, Min A for LB ADLs, and Min A +2 for functional transfers. Pt presenting with decreased cognition, vision, strength, balance, and activity tolerance. Pt presenting with undershooting during ADLs; pt denying diplopia however closing right eye. Pt will require further acute OT to facilitate safe dc. Recommend dc to CIR for intensive OT to optimize safety, independence with ADLs, and return to PLOF.     Follow Up Recommendations  CIR;Supervision/Assistance - 24 hour    Equipment Recommendations  3 in 1 bedside commode;Other (comment)(Defer to next venue)    Recommendations for Other Services PT consult;Rehab consult;Speech consult     Precautions / Restrictions Precautions Precautions: Fall Precaution Comments: watch O2 Restrictions Weight Bearing Restrictions: No      Mobility Bed Mobility Overal bed mobility: Needs Assistance Bed Mobility: Supine to Sit     Supine to sit: Min assist     General bed mobility comments: Pt at EOB with PT  Transfers Overall transfer level: Needs assistance Equipment used: 2 person hand held assist Transfers: Sit to/from [facility] Sit to Stand: Min assist;+2 physical assistance Stand pivot transfers: Min assist;+2 physical assistance       General transfer comment: Min A +2 for power up into standing. Pt requiring Min A +2 and two person hand held  assist.     Balance Overall balance assessment: Needs assistance Sitting-balance support: No upper extremity supported;Feet supported Sitting balance-Leahy Scale: Fair     Standing balance support: During functional activity;Bilateral upper extremity supported Standing balance-Leahy Scale: Poor Standing balance comment: Pt requiring physical A                           ADL either performed or assessed with clinical judgement   ADL Overall ADL's : Needs assistance/impaired Eating/Feeding: Supervision/ safety;Set up;Sitting Eating/Feeding Details (indicate cue type and reason): Full supervision required Grooming: Oral care;Min guard;Cueing for sequencing;Cueing for safety;Sitting Grooming Details (indicate cue type and reason): Pt sitting at EOB with Min Guard A to perform oral care. Noting undershooting as pt would reach for swab or cup. Pt denies double vision. Pt requiring cues for sequencing.  Upper Body Bathing: Min guard;Sitting   Lower Body Bathing: Minimal assistance;+2 for safety/equipment;Sit to/from stand   Upper Body Dressing : Min guard;Sitting   Lower Body Dressing: Moderate assistance;Sit to/from stand Lower Body Dressing Details (indicate cue type and reason): Mod A due to poor coorindation and vision for donning socks Toilet Transfer: Minimal assistance;+2 for physical assistance;Stand-pivot(simulated to recliner)           Functional mobility during ADLs: Minimal assistance;+2 for physical assistance;+2 for safety/equipment;Cueing for safety;Cueing for sequencing General ADL Comments: Pt presenting with decreased cognition, balance, strength, vision, and activity tolerance. Motivated to participate with therapy to get OOB to recliner     Vision Baseline Vision/History: Wears glasses Wears Glasses: At all times Patient Visual Report: Other (comment)(Pt denies  blurry or double vision) Vision Assessment?: Yes Eye Alignment: Within Functional  Limits Ocular Range of Motion: Impaired-to be further tested in functional context Alignment/Gaze Preference: Other (comment)(Decreased gaze to left) Tracking/Visual Pursuits: Impaired - to be further tested in functional context;Requires cues, head turns, or add eye shifts to track(Pt not holding gaze to left. Poor tracking and jumpy mvnt) Convergence: Other (comment)(right eye not converging) Depth Perception: Undershoots Additional Comments: During ADLs and testing, pt undershooting during reach; pt also reaching below objects. Pt denies any diplopia but randomly would close right eye (not for long). Pt with poor tracking and noting jumpy movements. Pt also not maintaining gaze to left visual field and presents with visual inattention     Perception     Praxis      Pertinent Vitals/Pain Pain Assessment: No/denies pain     Hand Dominance Right   Extremity/Trunk Assessment Upper Extremity Assessment Upper Extremity Assessment: Generalized weakness(Decreased coorindation)   Lower Extremity Assessment Lower Extremity Assessment: Defer to PT evaluation   Cervical / Trunk Assessment Cervical / Trunk Assessment: Normal   Communication Communication Communication: Other (comment)(Soft spoken)   Cognition Arousal/Alertness: Awake/alert Behavior During Therapy: Flat affect Overall Cognitive Status: Impaired/Different from baseline Area of Impairment: Attention;Following commands;Memory;Safety/judgement;Awareness;Problem solving                   Current Attention Level: Sustained Memory: Decreased short-term memory Following Commands: Follows one step commands inconsistently;Follows one step commands with increased time Safety/Judgement: Decreased awareness of safety Awareness: Emergent Problem Solving: Slow processing;Decreased initiation;Requires verbal cues General Comments: Pt oriented to place, time, and sitation. Pt requiring increased time and cues. Decreased  attention to task and poor problem solving.    General Comments  VSS throughout. SpO2 >90% on RA. BP 104/82    Exercises     Shoulder Instructions      Home Living Family/patient expects to be discharged to:: Private residence Living Arrangements: Other relatives   Type of Home: House                           Additional Comments: Patient vauge with responses for home set up and PLOF. Pt reporting he lives with to aunts.      Prior Functioning/Environment Level of Independence: Independent        Comments: Pt reporting he was independent and was looking for a job.         OT Problem List: Decreased strength;Decreased range of motion;Decreased activity tolerance;Impaired balance (sitting and/or standing);Decreased safety awareness;Decreased knowledge of use of DME or AE;Decreased knowledge of precautions;Decreased cognition;Impaired vision/perception;Decreased coordination;Pain      OT Treatment/Interventions: Self-care/ADL training;Therapeutic exercise;Energy conservation;DME and/or AE instruction;Therapeutic activities;Patient/family education    OT Goals(Current goals can be found in the care plan section) Acute Rehab OT Goals Patient Stated Goal: "Im hungry" OT Goal Formulation: With patient Time For Goal Achievement: 05/01/18 Potential to Achieve Goals: Good  OT Frequency: Min 3X/week   Barriers to D/C:            Co-evaluation PT/OT/SLP Co-Evaluation/Treatment: Yes Reason for Co-Treatment: Other (comment)(Dove tail for activity tolerance)   OT goals addressed during session: ADL's and self-care      AM-PAC OT "6 Clicks" Daily Activity     Outcome Measure Help from another person eating meals?: A Little Help from another person taking care of personal grooming?: A Little Help from another person toileting, which includes using toliet, bedpan, or urinal?: A Little  Help from another person bathing (including washing, rinsing, drying)?: A  Lot Help from another person to put on and taking off regular upper body clothing?: A Little Help from another person to put on and taking off regular lower body clothing?: A Lot 6 Click Score: 16   End of Session Equipment Utilized During Treatment: Gait belt Nurse Communication: Mobility status(Pt wanting to eat but needs supervision)  Activity Tolerance: Patient tolerated treatment well Patient left: in chair;with call bell/phone within reach;with chair alarm set  OT Visit Diagnosis: Unsteadiness on feet (R26.81);Other abnormalities of gait and mobility (R26.89);Muscle weakness (generalized) (M62.81);Pain;Other symptoms and signs involving cognitive function                Time: 1438-1455 OT Time Calculation (min): 17 min Charges:  OT General Charges $OT Visit: 1 Visit OT Evaluation $OT Eval Moderate Complexity: [place], OTR/L Acute Rehab Pager: [phone] Office: [place] 04/17/2018, 4:26 PM

## 2018-04-17 NOTE — Evaluation (Signed)
Physical Therapy Evaluation Patient Details Name: Travis Bryan MRN: [id number] DOB: 1970/05/21 Today's Date: 04/17/2018   History of Present Illness  48 y.o. male admitted with pneumonia and decompensated heart failure. PEA arrest on 1/20, intubated 1/20-2/04.  PMH includes but not limited to: HTN, HLD, CKD III, A fib on coumadin, AICD placement, MI, DM2.     Clinical Impression  Pt admitted with above diagnosis. Pt currently with functional limitations due to the deficits listed below (see PT Problem List). PTA pt reports living with family, independent with all mobility. Today, able to stand pivot to chair with min A of 2, poor balance, mild cognitive deficits and deconditioned. Feel patient will progress well with therapy and would benefit strongly from post acute rehab to regain PLOF.  Pt will benefit from skilled PT to increase their independence and safety with mobility to allow discharge to the venue listed below.    VSS on RA.        Follow Up Recommendations CIR    Equipment Recommendations  (TBD)    Recommendations for Other Services Rehab consult     Precautions / Restrictions Precautions Precautions: Fall Precaution Comments: watch O2 Restrictions Weight Bearing Restrictions: No      Mobility  Bed Mobility Overal bed mobility: Needs Assistance Bed Mobility: Supine to Sit     Supine to sit: Min assist     General bed mobility comments: Min A to assist supine to sit pt denies dizziness upon sitting EOB  Transfers Overall transfer level: Needs assistance Equipment used: 2 person hand held assist Transfers: Sit to/from [facility] Sit to Stand: Min assist Stand pivot transfers: Min assist       General transfer comment: Min A to assist standing and provide stability. initially very imbalanced and required time to get feet under himself, leaning back onto bed heavily. on second attempt less work and able to side step with hand held assist  x2 and SPT into chair with good control on descent  Ambulation/Gait             General Gait Details: deferred to tolerance   Stairs            Wheelchair Mobility    Modified Rankin (Stroke Patients Only)       Balance Overall balance assessment: Needs assistance   Sitting balance-Leahy Scale: Fair       Standing balance-Leahy Scale: Poor                               Pertinent Vitals/Pain Pain Assessment: No/denies pain    Home Living Family/patient expects to be discharged to:: Private residence Living Arrangements: Other relatives   Type of Home: House           Additional Comments: Patient with vauge responses to home set up. lives with his 2 aunts and was indepednent at baseline per his report    Prior Function Level of Independence: Independent               Hand Dominance        Extremity/Trunk Assessment   Upper Extremity Assessment Upper Extremity Assessment: Defer to OT evaluation    Lower Extremity Assessment Lower Extremity Assessment: (grossly 3+/5)       Communication   Communication: (poor phonation s/p intubation)  Cognition Arousal/Alertness: Lethargic Behavior During Therapy: Flat affect Overall Cognitive Status: Impaired/Different from baseline  General Comments: pt AOx4, however slow processing with poor attention.       General Comments      Exercises     Assessment/Plan    PT Assessment Patient needs continued PT services  PT Problem List Decreased strength       PT Treatment Interventions DME instruction;Gait training;Stair training;Therapeutic activities;Functional mobility training;Balance training;Therapeutic exercise    PT Goals (Current goals can be found in the Care Plan section)  Acute Rehab PT Goals Patient Stated Goal: non stated PT Goal Formulation: With patient Time For Goal Achievement: 05/01/18 Potential to Achieve  Goals: Good    Frequency Min 3X/week   Barriers to discharge        Co-evaluation               AM-PAC PT "6 Clicks" Mobility  Outcome Measure Help needed turning from your back to your side while in a flat bed without using bedrails?: A Little Help needed moving from lying on your back to sitting on the side of a flat bed without using bedrails?: A Little Help needed moving to and from a bed to a chair (including a wheelchair)?: A Little Help needed standing up from a chair using your arms (e.g., wheelchair or bedside chair)?: A Lot Help needed to walk in hospital room?: A Lot Help needed climbing 3-5 steps with a railing? : Total 6 Click Score: 14    End of Session Equipment Utilized During Treatment: Gait belt Activity Tolerance: Patient tolerated treatment well Patient left: in chair;with chair alarm set;with call bell/phone within reach Nurse Communication: Mobility status PT Visit Diagnosis: Unsteadiness on feet (R26.81)    Time: 9562-1308 PT Time Calculation (min) (ACUTE ONLY): 25 min   Charges:   PT Evaluation $PT Eval Moderate Complexity: 1 Mod PT Treatments $Therapeutic Activity: 8-22 mins       Reinaldo Berber, PT, DPT Acute Rehabilitation Services Pager: [phone] Office: [place] 04/17/2018, 3:37 PM

## 2018-04-17 NOTE — Evaluation (Signed)
Clinical/Bedside Swallow Evaluation Patient Details  Name: Travis Bryan MRN: [id number] Date of Birth: [birth year]  Today's Date: 04/17/2018 Time: SLP Start Time (ACUTE ONLY): 7253 SLP Stop Time (ACUTE ONLY): 0940 SLP Time Calculation (min) (ACUTE ONLY): 12 min  Past Medical History:  Past Medical History:  Diagnosis Date  . AICD (automatic cardioverter/defibrillator) present   . Atrial flutter ([place])    a. s/p ablation 03/2016  . Chronic systolic CHF (congestive heart failure) ([place])   . HCAP (healthcare-associated pneumonia) 03/28/2018  . History of gout   . Hyperlipidemia   . Hypertension   . Myocardial infarction [facility])    "I think I had one a long long time ago" (03/28/2016)  . NICM (nonischemic cardiomyopathy) ([place])    a. LHC 6/06: pLAD 20, pLCx 20-30; b. Echo 5/15:  EF 15%, diffuse HK, restrictive physiology, trivial AI, trivial MR, mild LAE, moderate RVE, moderately reduced RVSF, moderate RAE, mild to moderate TR, PASP 43 mmHg  . Obesity   . Persistent atrial fibrillation   . Type II diabetes mellitus ([place])    Past Surgical History:  Past Surgical History:  Procedure Laterality Date  . CARDIOVERSION N/A 04/07/2016   Procedure: CARDIOVERSION;  Surgeon: Thayer Headings, MD;  Location: [facility] ENDOSCOPY;  Service: Cardiovascular;  Laterality: N/A;  . CARDIOVERSION N/A 04/11/2016   Procedure: CARDIOVERSION;  Surgeon: Larey Dresser, MD;  Location: [place];  Service: Cardiovascular;  Laterality: N/A;  . ELECTROPHYSIOLOGIC STUDY N/A 03/31/2016   Procedure: A-Flutter Ablation;  Surgeon: Evans Lance, MD;  Location: [place] CV LAB;  Service: Cardiovascular;  Laterality: N/A;  . IMPLANTABLE CARDIOVERTER DEFIBRILLATOR (ICD) GENERATOR CHANGE N/A 08/27/2013   Procedure: ICD GENERATOR CHANGE;  Surgeon: Evans Lance, MD;  Location: [facility] CATH LAB;  Service: Cardiovascular;  Laterality: N/A;  . TEE WITHOUT CARDIOVERSION N/A 03/31/2016   Procedure: TRANSESOPHAGEAL ECHOCARDIOGRAM (TEE);   Surgeon: Larey Dresser, MD;  Location[facility] ENDOSCOPY;  Service: Cardiovascular;  Laterality: N/A;   HPI:  Pt is a 48 year old male with HTN, cardiomyopathy with AICD, atrial fibrillation, CHF and diabetes presenting with a one day history of chills and vomiting. Pt admitted 03/29/18 for acute hypoxemic respiratory failure secondary to CHF exacerbation and pneumonia. Suffered PEA arrest on 1/20 after episode of emesis and likely aspiration.  He was previously admitted with DKA and pneumonia in 12/19 with a similar presentation. Intubated 1/20- 2/4. CXR 1/30 revealed: Bilateral LOWER lobe consolidation involving the majority of the RIGHT LOWER lobe and much of the LEFT LOWER lobe, likely representing pneumonia. Mild bibasilar atelectasis. Mild bilateral ground-glass pulmonary opacities which are nonspecific but may represent mild edema. No pleural effusion or pneumothorax.   Assessment / Plan / Recommendation Clinical Impression  Pt demonstrates signs concerning for aspiration following prolonged intubation. Given concern for decreased airway protection, sensation and weak cough will proceed with MBS for more objective assessment of swallow function prior to diet initiation.  SLP Visit Diagnosis: Dysphagia, oropharyngeal phase (R13.12)    Aspiration Risk  Severe aspiration risk    Diet Recommendation NPO        Other  Recommendations     Follow up Recommendations        Frequency and Duration            Prognosis        Swallow Study   General HPI: Pt is a 48 year old male with HTN, cardiomyopathy with AICD, atrial fibrillation, CHF and diabetes presenting with a one day history of chills and  vomiting. Pt admitted 03/29/18 for acute hypoxemic respiratory failure secondary to CHF exacerbation and pneumonia. Suffered PEA arrest on 1/20 after episode of emesis and likely aspiration.  He was previously admitted with DKA and pneumonia in 12/19 with a similar presentation. Intubated 1/20- 2/4.  CXR 1/30 revealed: Bilateral LOWER lobe consolidation involving the majority of the RIGHT LOWER lobe and much of the LEFT LOWER lobe, likely representing pneumonia. Mild bibasilar atelectasis. Mild bilateral ground-glass pulmonary opacities which are nonspecific but may represent mild edema. No pleural effusion or pneumothorax. Type of Study: Bedside Swallow Evaluation Diet Prior to this Study: NPO Temperature Spikes Noted: No Respiratory Status: Nasal cannula History of Recent Intubation: Yes Length of Intubations (days): 16 days Date extubated: 04/16/18 Behavior/Cognition: Alert;Cooperative;Requires cueing Oral Cavity Assessment: Within Functional Limits Oral Care Completed by SLP: No Oral Cavity - Dentition: Poor condition;Missing dentition Vision: Functional for self-feeding Self-Feeding Abilities: Needs assist Patient Positioning: Upright in bed Baseline Vocal Quality: Hoarse;Breathy Volitional Cough: Weak Volitional Swallow: Able to elicit    Oral/Motor/Sensory Function Overall Oral Motor/Sensory Function: Within functional limits   Ice Chips Ice chips: Within functional limits   Thin Liquid Thin Liquid: Impaired Presentation: Cup Pharyngeal  Phase Impairments: Cough - Immediate    Nectar Thick Nectar Thick Liquid: Not tested   Honey Thick Honey Thick Liquid: Not tested   Puree Puree: Within functional limits Presentation: Self Fed;Spoon   Solid     Solid: Not tested     Herbie [place], MA [place] Pager [phone] Office [phone]  Lauro Manlove, Katherene Ponto 04/17/2018,10:39 AM

## 2018-04-18 ENCOUNTER — Inpatient Hospital Stay (HOSPITAL_COMMUNITY): Payer: Medicaid Other

## 2018-04-18 ENCOUNTER — Inpatient Hospital Stay: Payer: Self-pay | Admitting: Family Medicine

## 2018-04-18 DIAGNOSIS — R5381 Other malaise: Secondary | ICD-10-CM

## 2018-04-18 LAB — RENAL FUNCTION PANEL
ALBUMIN: 2.5 g/dL — AB (ref 3.5–5.0)
Albumin: 2.5 g/dL — ABNORMAL LOW (ref 3.5–5.0)
Anion gap: 19 — ABNORMAL HIGH (ref 5–15)
Anion gap: 20 — ABNORMAL HIGH (ref 5–15)
BUN: 87 mg/dL — ABNORMAL HIGH (ref 6–20)
BUN: 72 mg/dL — ABNORMAL HIGH (ref 6–20)
CO2: 20 mmol/L — AB (ref 22–32)
CO2: 17 mmol/L — ABNORMAL LOW (ref 22–32)
Calcium: 9.5 mg/dL (ref 8.9–10.3)
Calcium: 9.2 mg/dL (ref 8.9–10.3)
Chloride: 97 mmol/L — ABNORMAL LOW (ref 98–111)
Chloride: 97 mmol/L — ABNORMAL LOW (ref 98–111)
Creatinine, Ser: 9.02 mg/dL — ABNORMAL HIGH (ref 0.61–1.24)
Creatinine, Ser: 8.36 mg/dL — ABNORMAL HIGH (ref 0.61–1.24)
GFR calc Af Amer: 7 mL/min — ABNORMAL LOW (ref >60)
GFR calc Af Amer: 8 mL/min — ABNORMAL LOW (ref >60)
GFR calc non Af Amer: 6 mL/min — ABNORMAL LOW (ref >60)
GFR calc non Af Amer: 7 mL/min — ABNORMAL LOW (ref >60)
Glucose, Bld: 212 mg/dL — ABNORMAL HIGH (ref 70–99)
Glucose, Bld: 302 mg/dL — ABNORMAL HIGH (ref 70–99)
PHOSPHORUS: 4.5 mg/dL (ref 2.5–4.6)
Phosphorus: 5 mg/dL — ABNORMAL HIGH (ref 2.5–4.6)
Potassium: 3.5 mmol/L (ref 3.5–5.1)
Potassium: 3.3 mmol/L — ABNORMAL LOW (ref 3.5–5.1)
Sodium: 136 mmol/L (ref 135–145)
Sodium: 134 mmol/L — ABNORMAL LOW (ref 135–145)

## 2018-04-18 LAB — MAGNESIUM: Magnesium: 3.6 mg/dL — ABNORMAL HIGH (ref 1.7–2.4)

## 2018-04-18 LAB — CBC
HCT: 40.6 % (ref 39.0–52.0)
Hemoglobin: 12.3 g/dL — ABNORMAL LOW (ref 13.0–17.0)
MCH: 28.3 pg (ref 26.0–34.0)
MCHC: 30.3 g/dL (ref 30.0–36.0)
MCV: 93.5 fL (ref 80.0–100.0)
Platelets: 370 10*3/uL (ref 150–400)
RBC: 4.34 MIL/uL (ref 4.22–5.81)
RDW: 15.9 % — ABNORMAL HIGH (ref 11.5–15.5)
WBC: 11.7 10*3/uL — ABNORMAL HIGH (ref 4.0–10.5)
nRBC: 0 % (ref 0.0–0.2)

## 2018-04-18 LAB — PROTIME-INR
INR: 1.42
Prothrombin Time: 17.2 seconds — ABNORMAL HIGH (ref 11.4–15.2)

## 2018-04-18 LAB — GLUCOSE, CAPILLARY
Glucose-Capillary: 121 mg/dL — ABNORMAL HIGH (ref 70–99)
Glucose-Capillary: 104 mg/dL — ABNORMAL HIGH (ref 70–99)
Glucose-Capillary: 188 mg/dL — ABNORMAL HIGH (ref 70–99)
Glucose-Capillary: 264 mg/dL — ABNORMAL HIGH (ref 70–99)
Glucose-Capillary: 321 mg/dL — ABNORMAL HIGH (ref 70–99)

## 2018-04-18 LAB — HEPARIN LEVEL (UNFRACTIONATED)
Heparin Unfractionated: 0.54 IU/mL (ref 0.30–0.70)
Heparin Unfractionated: 0.73 IU/mL — ABNORMAL HIGH (ref 0.30–0.70)

## 2018-04-18 MED ORDER — ALTEPLASE 2 MG IJ SOLR
INTRAMUSCULAR | Status: AC
  Administered 2018-04-18: 4 mg
  Filled 2018-04-18: qty 4

## 2018-04-18 MED ORDER — HEPARIN SODIUM (PORCINE) 1000 UNIT/ML IJ SOLN
INTRAMUSCULAR | Status: AC
  Administered 2018-04-18: 2800 [IU]
  Filled 2018-04-18: qty 4

## 2018-04-18 MED ORDER — ALTEPLASE 2 MG IJ SOLR
2.0000 mg | Freq: Once | INTRAMUSCULAR | Status: DC | PRN
  Filled 2018-04-18: qty 2

## 2018-04-18 MED ORDER — ALBUTEROL SULFATE (2.5 MG/3ML) 0.083% IN NEBU
2.5000 mg | INHALATION_SOLUTION | Freq: Three times a day (TID) | RESPIRATORY_TRACT | Status: DC
  Administered 2018-04-18 – 2018-04-26 (×22): 2.5 mg via RESPIRATORY_TRACT
  Filled 2018-04-18 (×24): qty 3

## 2018-04-18 NOTE — Progress Notes (Signed)
   04/18/18 1100  OT Visit Information  Last OT Received On 04/18/18  Assistance Needed +2  Reason Eval/Treat Not Completed Patient at procedure or test/ unavailable  History of Present Illness 48 y.o. male admitted with pneumonia and decompensated heart failure. PEA arrest on 1/20, intubated 1/20-2/04.  PMH includes but not limited to: HTN, HLD, CKD III, A fib on coumadin, AICD placement, MI, DM2.    Plan to reattempt later today or tomorrow.  Tyrone Schimke, OT Acute Rehabilitation Services Pager: [phone] Office: [phone]

## 2018-04-18 NOTE — Progress Notes (Signed)
SLP Cancellation Note  Patient Details Name: Miquan Tandon MRN: [id number] DOB: [birth year]   Cancelled treatment:       Reason Eval/Treat Not Completed: Patient at procedure or test/unavailable   Arav Bannister, Katherene Ponto 04/18/2018, 8:02 AM

## 2018-04-18 NOTE — Progress Notes (Signed)
[place] for Warfarin/Heparin Indication: atrial fibrillation  Patient Measurements: Height: 5\' 10"  (177.8 cm) Weight: 229 lb 0.9 oz (103.9 kg) IBW/kg (Calculated) : 73  Vital Signs: Temp: 97.8 F (36.6 C) (02/06 0705) Temp Source: Oral (02/06 0705) BP: (P) 97/57 (02/06 1200) Pulse Rate: (P) 84 (02/06 1200)  Labs: Recent Labs    04/16/18 0348 04/16/18 1224  04/17/18 0658 04/17/18 1725 04/17/18 1730 04/18/18 0500 04/18/18 0753  HGB 11.6*  --   --  12.9*  --   --   --  12.3*  HCT 39.0  --   --  41.6  --   --   --  40.6  PLT 312 356  --  354  --   --   --  370  APTT 66*  --   --   --   --   --   --   --   LABPROT 34.5*  --   --  16.9*  --   --   --  17.2*  INR 3.49  --   --  1.39  --   --   --  1.42  HEPARINUNFRC  --   --   --   --  0.12*  --   --  0.54  CREATININE 3.40*  --    < > 5.99*  --  7.03* 9.02*  --    < > = values in this interval not displayed.    Assessment: 48 year old male presenting with NV and fever, h/o afib on warfarin PTA.   Heparin had been held for multiple days d/t elevated INR.  s/p vit K and INR down to 1.39 yesterday 2/5 thus  IV heparin resumed on 04/17/18.  Today the heparin level this AM is therapeutic at 0.54 on heparin drip 1250 units/hr. INR 3.49>vit K> INR 1.30>1.42 - Warfarin remains on hold. S/p Vitamin K given on 2/4,  and 04/05/18 + FFP 1/21 and 04/03/18.  Today Hgb low/stable at 12.3, pltc wnl.  No bleeding reported.   Goal of Therapy:  Heparin level 0.3-0.7 units/ml Monitor platelets by anticoagulation protocol: Yes   Plan:  Continue IV heparin 1250 units/hr Recheck Heparin level @17 :00 to confirm therapeutic  Daily HL CBC INR  Nicole Cella, RPh Clinical Pharmacist Clinical phone for 04/17/18 until 10:30pm: [phone] If after 10:30pm, please refer to [facility] for unit-specific pharmacist

## 2018-04-18 NOTE — Progress Notes (Signed)
[place] for Warfarin/Heparin Indication: atrial fibrillation  Patient Measurements: Height: 5\' 10"  (177.8 cm) Weight: 226 lb 6.6 oz (102.7 kg) IBW/kg (Calculated) : 73  Vital Signs: Temp: 97.8 F (36.6 C) (02/06 1636) Temp Source: Axillary (02/06 1636) BP: 110/88 (02/06 1636) Pulse Rate: 77 (02/06 1636)  Labs: Recent Labs    04/16/18 0348 04/16/18 1224  04/17/18 0658 04/17/18 1725 04/17/18 1730 04/18/18 0500 04/18/18 0753 04/18/18 1650 04/18/18 1810  HGB 11.6*  --   --  12.9*  --   --   --  12.3*  --   --   HCT 39.0  --   --  41.6  --   --   --  40.6  --   --   PLT 312 356  --  354  --   --   --  370  --   --   APTT 66*  --   --   --   --   --   --   --   --   --   LABPROT 34.5*  --   --  16.9*  --   --   --  17.2*  --   --   INR 3.49  --   --  1.39  --   --   --  1.42  --   --   HEPARINUNFRC  --   --   --   --  0.12*  --   --  0.54  --  0.73*  CREATININE 3.40*  --    < > 5.99*  --  7.03* 9.02*  --  8.36*  --    < > = values in this interval not displayed.    Assessment: 48 year old male presenting with NV and fever, h/o afib on warfarin PTA.   Heparin had been held for multiple days d/t elevated INR.  s/p vit K and INR down to 1.39 yesterday 2/5 thus  IV heparin resumed on 04/17/18.   HL this evening has trended up to 0.73 on 1250 units/hr. No s/s of bleeding per RN.    Goal of Therapy:  Heparin level 0.3-0.7 units/ml Monitor platelets by anticoagulation protocol: Yes   Plan:  Decrease IV heparin to 1200 units/hr Check AM HL Daily HL CBC INR  Albertina Parr, PharmD., BCPS Clinical Pharmacist Clinical phone for 04/18/18 until 10:30pm: [phone] If after 10:30pm, please refer to [facility] for unit-specific pharmacist

## 2018-04-18 NOTE — Progress Notes (Signed)
[facility] Progress Note    Assessment/ Plan:   1.Acute kidney injury on chronic kidney disease stage IIIb/IV: (baseline creatinine 2.5-2.8) cardiorenal versus ATN and highly likely to have progressed to ESRD at this point. S/p CRRT- came off2/4. IHD today, next for Saturday.  Will need to trend Cr- maybe not recoverable but remains to be seen.  2. Acute hypoxic respiratory failure: With evidence of ARDS and preceding viral pneumonia/superimposed HCAP versus aspiration pneumonia- got Zosyn.  extubated  3. Anemia: Slight downtrend trend of hemoglobin/hematocrit noted.  Will start ESA.  4. History of atrial fibrillation/atrial flutter status post DCCV and ablation.  He is currently rate controlled  5. Acute exacerbation of CHF with cardiogenic shock: per primary,   6.  Dispo: now on the floor!  Subjective:    Tolerated IHD today well.  .   Objective:   BP 115/87 (BP Location: Right Arm)   Pulse 76   Temp 97.9 F (36.6 C) (Oral)   Resp 16   Ht 5\' 10"  (1.778 m)   Wt 102.7 kg   SpO2 98%   BMI 32.49 kg/m   Intake/Output Summary (Last 24 hours) at 04/18/2018 1559 Last data filed at 04/18/2018 1221 Gross per 24 hour  Intake 130 ml  Output 100 ml  Net 30 ml   Weight change:   Physical Exam: Gen: ill-appearing, more awake, NAD HEENT: on [place] O2 CVS:RRR Resp: bilateral anteriorly clar Abd: distended, nontender, hypoactive BS Ext: trace LE edema ACCESS: I IJ nontunneled HD cath  Imaging: Dg Chest Port 1 View  Result Date: 04/18/2018 CLINICAL DATA:  Respiratory failure EXAM: PORTABLE CHEST 1 VIEW COMPARISON:  04/17/2018 FINDINGS: The heart remains mildly enlarged. Left subclavian AICD device is stable. Left jugular central venous catheter is stable with its tip at the upper SVC. Lungs are under aerated and clear. Normal vascularity. IMPRESSION: Cardiomegaly without decompensation. Low lung volumes. No evidence of CHF or pneumonia. Electronically Signed   By:  Marybelle Killings M.D.   On: 04/18/2018 15:11   Dg Chest Port 1 View  Result Date: 04/17/2018 CLINICAL DATA:  Respiratory failure EXAM: PORTABLE CHEST 1 VIEW COMPARISON:  Yesterday FINDINGS: Single chamber ICD lead into the right ventricle. Low volume chest but improved inflation and decreasing atelectasis. Left IJ line in stable position. The trachea and esophagus have been extubated. IMPRESSION: Improved inflation after extubation. Lung volumes remain low and there is still lower lobe atelectasis. Electronically Signed   By: Monte Fantasia M.D.   On: 04/17/2018 08:32   Dg Swallowing Func-speech Pathology  Result Date: 04/17/2018 Objective Swallowing Evaluation: Type of Study: MBS-Modified Barium Swallow Study  Patient Details Name: Travis Bryan MRN: [id number] Date of Birth: Sep 21, 1970 Today's Date: 04/17/2018 Time: SLP Start Time (ACUTE ONLY): 1110 -SLP Stop Time (ACUTE ONLY): 1130 SLP Time Calculation (min) (ACUTE ONLY): 20 min Past Medical History: Past Medical History: Diagnosis Date . AICD (automatic cardioverter/defibrillator) present  . Atrial flutter ([place])   a. s/p ablation 03/2016 . Chronic systolic CHF (congestive heart failure) ([place])  . HCAP (healthcare-associated pneumonia) 03/28/2018 . History of gout  . Hyperlipidemia  . Hypertension  . Myocardial infarction [facility])   "I think I had one a long long time ago" (03/28/2016) . NICM (nonischemic cardiomyopathy) ([place])   a. LHC 6/06: pLAD 20, pLCx 20-30; b. Echo 5/15:  EF 15%, diffuse HK, restrictive physiology, trivial AI, trivial MR, mild LAE, moderate RVE, moderately reduced RVSF, moderate RAE, mild to moderate TR, PASP 43 mmHg .  Obesity  . Persistent atrial fibrillation  . Type II diabetes mellitus ([place])  Past Surgical History: Past Surgical History: Procedure Laterality Date . CARDIOVERSION N/A 04/07/2016  Procedure: CARDIOVERSION;  Surgeon: Thayer Headings, MD;  Location: [facility] ENDOSCOPY;  Service: Cardiovascular;  Laterality: N/A; . CARDIOVERSION N/A  04/11/2016  Procedure: CARDIOVERSION;  Surgeon: Larey Dresser, MD;  Location: [facility];  Service: Cardiovascular;  Laterality: N/A; . ELECTROPHYSIOLOGIC STUDY N/A 03/31/2016  Procedure: A-Flutter Ablation;  Surgeon: Evans Lance, MD;  Location: [facility] CV LAB;  Service: Cardiovascular;  Laterality: N/A; . IMPLANTABLE CARDIOVERTER DEFIBRILLATOR (ICD) GENERATOR CHANGE N/A 08/27/2013  Procedure: ICD GENERATOR CHANGE;  Surgeon: Evans Lance, MD;  Location: [facility] CATH LAB;  Service: Cardiovascular;  Laterality: N/A; . TEE WITHOUT CARDIOVERSION N/A 03/31/2016  Procedure: TRANSESOPHAGEAL ECHOCARDIOGRAM (TEE);  Surgeon: Larey Dresser, MD;  Location: [facility] ENDOSCOPY;  Service: Cardiovascular;  Laterality: N/A; HPI: Pt is a 48 year old male with HTN, cardiomyopathy with AICD, atrial fibrillation, CHF and diabetes presenting with a one day history of chills and vomiting. Pt admitted 03/29/18 for acute hypoxemic respiratory failure secondary to CHF exacerbation and pneumonia. Suffered PEA arrest on 1/20 after episode of emesis and likely aspiration.  He was previously admitted with DKA and pneumonia in 12/19 with a similar presentation. Intubated 1/20- 2/4. CXR 1/30 revealed: Bilateral LOWER lobe consolidation involving the majority of the RIGHT LOWER lobe and much of the LEFT LOWER lobe, likely representing pneumonia. Mild bibasilar atelectasis. Mild bilateral ground-glass pulmonary opacities which are nonspecific but may represent mild edema. No pleural effusion or pneumothorax.  No data recorded Assessment / Plan / Recommendation CHL IP CLINICAL IMPRESSIONS 04/17/2018 Clinical Impression Pt demonstrates moderate oropharyngeal dysphagia with sensory deficits and decreased laryngeal closure leading to silent aspriation before and during the swallow with liquids thinner than honey. Decreasing nectar thick bolus to teaspoon size to contain bolus in the valleculae prior to swallow initiation is also a helpful strategy,  Otherwise, liquids spill into the vestibule and fall past the cords before/during the swallow with intermittent delayed cough. Honey thick liquids tolerated with penetration or very trace aspiration even if bolus quite large. Cued throat clear ejects material. Oral phase prolonged due to poor dentition but may be baseline. Given pts deconditioning and high risk for fatigue will initaite a dys 2 diet with honey thick liquids. As mentation and generalized strength improve and vocal quality become clearer would expect improvement in timing and airway protection. In the meantime will train pt in small sips and throat clearing as compensatory strategies.  SLP Visit Diagnosis Dysphagia, oropharyngeal phase (R13.12) Attention and concentration deficit following -- Frontal lobe and executive function deficit following -- Impact on safety and function Moderate aspiration risk   CHL IP TREATMENT RECOMMENDATION 04/17/2018 Treatment Recommendations Therapy as outlined in treatment plan below   No flowsheet data found. CHL IP DIET RECOMMENDATION 04/17/2018 SLP Diet Recommendations Dysphagia 2 (Fine chop) solids;Honey thick liquids Liquid Administration via Cup Medication Administration Whole meds with puree Compensations Slow rate;Small sips/bites;Clear throat after each swallow Postural Changes Seated upright at 90 degrees   CHL IP OTHER RECOMMENDATIONS 04/17/2018 Recommended Consults -- Oral Care Recommendations Oral care BID Other Recommendations --   CHL IP FOLLOW UP RECOMMENDATIONS 04/17/2018 Follow up Recommendations Skilled Nursing facility   [facility] IP FREQUENCY AND DURATION 04/17/2018 Speech Therapy Frequency (ACUTE ONLY) min 2x/week Treatment Duration 2 weeks      CHL IP ORAL PHASE 04/17/2018 Oral Phase Impaired Oral - Pudding Teaspoon -- Oral -  Pudding Cup -- Oral - Honey Teaspoon -- Oral - Honey Cup WFL Oral - Nectar Teaspoon WFL Oral - Nectar Cup WFL Oral - Nectar Straw WFL Oral - Thin Teaspoon -- Oral - Thin Cup -- Oral - Thin  Straw -- Oral - Puree WFL Oral - Mech Soft Delayed oral transit;Impaired mastication Oral - Regular -- Oral - Multi-Consistency -- Oral - Pill -- Oral Phase - Comment --  CHL IP PHARYNGEAL PHASE 04/17/2018 Pharyngeal Phase Impaired Pharyngeal- Pudding Teaspoon -- Pharyngeal -- Pharyngeal- Pudding Cup -- Pharyngeal -- Pharyngeal- Honey Teaspoon -- Pharyngeal -- Pharyngeal- Honey Cup Delayed swallow initiation-pyriform sinuses;Delayed swallow initiation-vallecula;Reduced airway/laryngeal closure;Penetration/Aspiration before swallow;Penetration/Aspiration during swallow;Trace aspiration;Compensatory strategies attempted (with notebox) Pharyngeal Material does not enter airway;Material enters airway, CONTACTS cords and then ejected out;Material enters airway, CONTACTS cords and not ejected out;Material enters airway, passes BELOW cords without attempt by patient to eject out (silent aspiration) Pharyngeal- Nectar Teaspoon Delayed swallow initiation-vallecula;Reduced airway/laryngeal closure Pharyngeal Material does not enter airway Pharyngeal- Nectar Cup Delayed swallow initiation-pyriform sinuses;Delayed swallow initiation-vallecula;Reduced airway/laryngeal closure;Penetration/Aspiration before swallow;Penetration/Aspiration during swallow;Moderate aspiration Pharyngeal Material enters airway, passes BELOW cords without attempt by patient to eject out (silent aspiration);Material enters airway, passes BELOW cords and not ejected out despite cough attempt by patient Pharyngeal- Nectar Straw Delayed swallow initiation-pyriform sinuses;Delayed swallow initiation-vallecula;Reduced airway/laryngeal closure;Penetration/Aspiration before swallow;Penetration/Aspiration during swallow;Trace aspiration;Compensatory strategies attempted (with notebox) Pharyngeal Material enters airway, passes BELOW cords without attempt by patient to eject out (silent aspiration) Pharyngeal- Thin Teaspoon -- Pharyngeal -- Pharyngeal- Thin Cup --  Pharyngeal -- Pharyngeal- Thin Straw -- Pharyngeal -- Pharyngeal- Puree Delayed swallow initiation-vallecula Pharyngeal -- Pharyngeal- Mechanical Soft Delayed swallow initiation-vallecula Pharyngeal -- Pharyngeal- Regular -- Pharyngeal -- Pharyngeal- Multi-consistency -- Pharyngeal -- Pharyngeal- Pill -- Pharyngeal -- Pharyngeal Comment --  No flowsheet data found. Herbie Baltimore, MA CCC-SLP Acute Rehabilitation Services Pager [phone] Office [phone] Lynann Beaver 04/17/2018, 12:01 PM               Labs: BMET Recent Labs  Lab 04/14/18 1755 04/15/18 0435 04/15/18 0439 04/16/18 0348 04/16/18 1547 04/17/18 0658 04/17/18 1730 04/18/18 0500  NA 136 135 137 136 136 136 135 136  K 4.1 4.3 4.7 4.0 3.7 3.6 3.7 3.5  CL 100  --  99 97* 98 95* 96* 97*  CO2 22  --  22 23 19* 19* 15* 20*  GLUCOSE 168*  --  144* 175* 169* 173* 225* 212*  BUN 32*  --  31* 35* 39* 58* 70* 87*  CREATININE 3.46*  --  3.42* 3.40* 3.67* 5.99* 7.03* 9.02*  CALCIUM 8.9  --  9.2 9.2 9.7 9.9 10.0 9.5  PHOS 4.3  --  4.5 4.2  4.5 3.9 5.4* 5.7* 5.0*   CBC Recent Labs  Lab 04/15/18 0439 04/16/18 0348 04/16/18 1224 04/17/18 0658 04/18/18 0753  WBC 11.6* 9.7  --  10.3 11.7*  HGB 11.3* 11.6*  --  12.9* 12.3*  HCT 38.5* 39.0  --  41.6 40.6  MCV 96.5 95.8  --  94.5 93.5  PLT 271 312 356 354 370    Medications:    . albuterol  2.5 mg Nebulization TID  . allopurinol  300 mg Per Tube Daily  . amiodarone  200 mg Per Tube Daily  . atorvastatin  40 mg Per Tube q1800  . Chlorhexidine Gluconate Cloth  6 each Topical Daily  . Chlorhexidine Gluconate Cloth  6 each Topical Q0600  . docusate  50 mg Per Tube BID  .  insulin aspart  0-9 Units Subcutaneous TID WC  . mouth rinse  15 mL Mouth Rinse BID  . nystatin  5 mL Oral QID  . pantoprazole sodium  40 mg Per Tube Daily  . polyethylene glycol  17 g Per Tube Daily  . sennosides  5 mL Per Tube BID  . sodium chloride flush  10-40 mL Intracatheter Q12H       Madelon Lips MD [facility] pgr [phone] Cell [phone] 04/18/2018, 3:59 PM

## 2018-04-18 NOTE — Progress Notes (Signed)
[facility] Progress Note  Patient: Travis Bryan RJJ:[id number]   PCP: Carroll Sage, MD DOB: [birth year]   DOA: 03/28/2018   DOS: 04/18/2018   Date of Service: the patient was seen and examined on 04/18/2018  Brief hospital course: Pt. with PMH of HTN, HLD, CKD III, A fib on coumadin, chronic NICM EF 25% AICD placement, MI, DM2; admitted on 03/28/2018, presented with complaint of fever and shortness of breath, was found to have sepsis secondary to pneumonia.  Had a respiratory failure requiring intubation followed by PEA arrest on 04/01/2018.  Renal function progressively worsened and required CVVHD currently on HD.  Advanced heart failure team was also consulted currently signed off. Left IJ HD catheter inserted on 04/13/2018. Currently further plan is continue HD and monitor progress.  Subjective: Reports of back pain.  Continues to have shortness of breath.  Complaints of fatigue and tiredness after HD.  No nausea no vomiting.  No diarrhea reported.  Telemetry: No acute events  Assessment and Plan: Moderate ARDS Acute Hypoxic Respiratory Failure, multifactorial Metapneumovirus pneumonia with superimposed bacterial pneumonia complicated by decompensated HF and renal failure.  Initially improving and extubated 04/16/2018 No acute distress O2 as needed Pulmonary toilet Stopped Zosyn 04/17/2018  Acute kidney injury and chronic kidney disease stage III Appreciate nephrology assistance. Appears to have likely progressed to ESRD status. S/P CRRT. Continue HD.  PEA arrest secondary to respiratory failure Cardiogenic shock History of nonischemic cardiomyopathy status post ICD, paroxysmal atrial fibrillation Negative volume status with HD Heart failure team was consulted, currently signed off Continue amiodarone po Continue heparin drip and resume coumadin per pharmacy  ?Cholelithiasis Possible Cirrhosis Noted on CT abdomen, no radiographic evidence of cholecystitis. Marked  leukocytosis but likely HCAP. Alk phos is elevated 213 but normal AST, ALT, and T bili.  LFTs rechecked on 04/16/2018 alkaline phosphate 197, AST 42 Continue to monitor  Suspected Ileus -improving  Advance diet  Coagulopathy/Suprtherapeutic -hbg /plt stable  He was treated with vitamin K x1 in anticipation of tracheostomy tracheostomy is not needed therefore he will resume Coumadin as previously. Continue heparin for bridging.  Atrial fibrillation/atrial flutter status post ablation - CHADS-VASc Scoreof 4 - Continue amiodarone 200 mg daily and  heparin per pharmacy  Hypertension Home medications include amlodipine 5 mg QD,carvedilol 25 mg QD, hydralazine 100 mg QD, Imdur 90 mg QD, and torsemide 60 mg QD Currently all blood pressure medication on hold due to hypotension.  Type 2 diabetes uncontrolled with hyperglycemia. - Most recent A1c is 10.3 Continue sliding scale insulin  Cardiomyopathy with AICD CAD  History of MI - Home medications include carvedilol, warfarin, atorstatin and Ranexa Currently on hold due to hypotension  Hyperlipidemia - Continue atorvastatin 40 mg daily  Gout - Continue allopurinol 300 mg daily   Pressure Injury 04/16/18 Stage II -  Partial thickness loss of dermis presenting as a shallow open ulcer with a red, pink wound bed without slough. size of nickel (Active)  04/16/18 0800  Location: Buttocks  Location Orientation: Left  Staging: Stage II -  Partial thickness loss of dermis presenting as a shallow open ulcer with a red, pink wound bed without slough.  Wound Description (Comments): size of nickel  Present on Admission:      Pressure Injury 04/17/18 Stage II -  Partial thickness loss of dermis presenting as a shallow open ulcer with a red, pink wound bed without slough. (Active)  04/17/18 1710  Location: Coccyx  Location Orientation: Medial  Staging: Stage II -  Partial thickness loss of dermis presenting as a shallow open ulcer with a  red, pink wound bed without slough.  Wound Description (Comments):   Present on Admission: Yes     Diet: renal diet DVT Prophylaxis: subcutaneous Heparin  Advance goals of care discussion: full code  Family Communication: no family was present at bedside, at the time of interview.   Disposition:  Discharge to be determined.  Consultants: Nephrology advanced heart failure services, primary admission with CCM. Procedures: Echocardiogram, intubation, left IJ HD catheter placement  Scheduled Meds: . albuterol  2.5 mg Nebulization TID  . allopurinol  300 mg Per Tube Daily  . amiodarone  200 mg Per Tube Daily  . atorvastatin  40 mg Per Tube q1800  . Chlorhexidine Gluconate Cloth  6 each Topical Daily  . Chlorhexidine Gluconate Cloth  6 each Topical Q0600  . docusate  50 mg Per Tube BID  . insulin aspart  0-9 Units Subcutaneous TID WC  . mouth rinse  15 mL Mouth Rinse BID  . nystatin  5 mL Oral QID  . pantoprazole sodium  40 mg Per Tube Daily  . polyethylene glycol  17 g Per Tube Daily  . sennosides  5 mL Per Tube BID  . sodium chloride flush  10-40 mL Intracatheter Q12H   Continuous Infusions: . sodium chloride 10 mL/hr at 04/17/18 0036  . sodium chloride    . sodium chloride    . heparin 1,250 Units/hr (04/17/18 1920)   PRN Meds: sodium chloride, sodium chloride, sodium chloride, acetaminophen **OR** acetaminophen, bisacodyl, diphenhydrAMINE, heparin, lidocaine (PF), lidocaine-prilocaine, lip balm, lip balm, ondansetron (ZOFRAN) IV **OR** [DISCONTINUED] ondansetron, pentafluoroprop-tetrafluoroeth, RESOURCE THICKENUP CLEAR, sodium chloride flush Antibiotics: Anti-infectives (From admission, onward)   Start     Dose/Rate Route Frequency Ordered Stop   04/12/18 0830  ceFEPIme (MAXIPIME) 2 g in sodium chloride 0.9 % 100 mL IVPB  Status:  Discontinued     2 g 200 mL/hr over 30 Minutes Intravenous Every 12 hours 04/12/18 0816 04/12/18 0820   04/12/18 0830  piperacillin-tazobactam  (ZOSYN) IVPB 3.375 g  Status:  Discontinued     3.375 g 100 mL/hr over 30 Minutes Intravenous Every 6 hours 04/12/18 0820 04/17/18 0940   04/02/18 0930  cefTRIAXone (ROCEPHIN) 2 g in sodium chloride 0.9 % 100 mL IVPB     2 g 200 mL/hr over 30 Minutes Intravenous Every 24 hours 04/01/18 1018 04/04/18 0938   04/01/18 1000  ceFEPIme (MAXIPIME) 2 g in sodium chloride 0.9 % 100 mL IVPB  Status:  Discontinued     2 g 200 mL/hr over 30 Minutes Intravenous Every 24 hours 03/31/18 1212 04/01/18 1018   03/29/18 1500  azithromycin (ZITHROMAX) 250 mg in dextrose 5 % 125 mL IVPB  Status:  Discontinued     250 mg 125 mL/hr over 60 Minutes Intravenous Every 24 hours 03/29/18 1458 03/29/18 1501   03/29/18 1000  cefTRIAXone (ROCEPHIN) 1 g in sodium chloride 0.9 % 100 mL IVPB  Status:  Discontinued     1 g 200 mL/hr over 30 Minutes Intravenous Every 24 hours 03/28/18 1553 03/28/18 2204   03/29/18 0800  vancomycin (VANCOCIN) 1,500 mg in sodium chloride 0.9 % 500 mL IVPB  Status:  Discontinued     1,500 mg 250 mL/hr over 120 Minutes Intravenous Every 36 hours 03/29/18 0740 03/30/18 1048   03/29/18 0745  ceFEPIme (MAXIPIME) 1 g in sodium chloride 0.9 % 100 mL IVPB  Status:  Discontinued  1 g 200 mL/hr over 30 Minutes Intravenous Every 12 hours 03/29/18 0734 03/31/18 1212   03/28/18 2300  cefTRIAXone (ROCEPHIN) 1 g in sodium chloride 0.9 % 100 mL IVPB  Status:  Discontinued     1 g 200 mL/hr over 30 Minutes Intravenous Every 24 hours 03/28/18 2204 03/29/18 0718   03/28/18 2200  doxycycline (VIBRA-TABS) tablet 100 mg     100 mg Oral Every 12 hours 03/28/18 1820 03/30/18 2200   03/28/18 1815  azithromycin (ZITHROMAX) tablet 500 mg  Status:  Discontinued     500 mg Oral Daily 03/28/18 1813 03/28/18 1820   03/28/18 1300  vancomycin (VANCOCIN) IVPB 1000 mg/200 mL premix     1,000 mg 200 mL/hr over 60 Minutes Intravenous  Once 03/28/18 1258 03/28/18 1638   03/28/18 1300  piperacillin-tazobactam (ZOSYN) IVPB  3.375 g     3.375 g 12.5 mL/hr over 240 Minutes Intravenous  Once 03/28/18 1258 03/28/18 1704       Objective: Physical Exam: Vitals:   04/18/18 1221 04/18/18 1324 04/18/18 1346 04/18/18 1636  BP: 115/87 119/88  110/88  Pulse: 72 81 76 77  Resp: (!) '21 19 16 18  '$ Temp: 97.9 F (36.6 C)   97.8 F (36.6 C)  TempSrc: Oral   Axillary  SpO2: 97% 98% 98% 95%  Weight: 102.7 kg     Height:        Intake/Output Summary (Last 24 hours) at 04/18/2018 1831 Last data filed at 04/18/2018 1600 Gross per 24 hour  Intake 719 ml  Output 100 ml  Net 619 ml   Filed Weights   04/17/18 0400 04/18/18 0705 04/18/18 1221  Weight: 94.8 kg 103.9 kg 102.7 kg   General: Alert, Awake and Oriented to Time, Place and Person. Appear in moderate distress, affect flat Eyes: PERRL, Conjunctiva normal ENT: Oral Mucosa clear moist Neck: no JVD, no Abnormal Mass Or lumps Cardiovascular: S1 and S2 Present, no Murmur, Peripheral Pulses Present Respiratory: increased respiratory effort, Bilateral Air entry equal and Decreased, no use of accessory muscle, bilateral Crackles, no wheezes Abdomen: Bowel Sound present, Soft and no tenderness, no hernia Skin: no redness, no Rash, non induration Extremities: tracve Pedal edema, no calf tenderness Neurologic: Grossly no focal neuro deficit. Bilaterally Equal motor strength  Data Reviewed: CBC: Recent Labs  Lab 04/14/18 0308  04/15/18 0435 04/15/18 0439 04/16/18 0348 04/16/18 1224 04/17/18 0658 04/18/18 0753  WBC 17.0*  --   --  11.6* 9.7  --  10.3 11.7*  HGB 10.6*   < > 11.9* 11.3* 11.6*  --  12.9* 12.3*  HCT 36.0*   < > 35.0* 38.5* 39.0  --  41.6 40.6  MCV 97.3  --   --  96.5 95.8  --  94.5 93.5  PLT 291  --   --  271 312 356 354 370   < > = values in this interval not displayed.   Basic Metabolic Panel: Recent Labs  Lab 04/13/18 0417  04/14/18 0308  04/15/18 0439 04/16/18 0348 04/16/18 1547 04/17/18 0658 04/17/18 1730 04/18/18 0500 04/18/18 0753  04/18/18 1650  NA 136   < > 135   < > 137 136 136 136 135 136  --  134*  K 4.7   < > 4.5   < > 4.7 4.0 3.7 3.6 3.7 3.5  --  3.3*  CL 100   < > 100   < > 99 97* 98 95* 96* 97*  --  97*  CO2 24   < > 22   < > 22 23 19* 19* 15* 20*  --  17*  GLUCOSE 142*   < > 152*   < > 144* 175* 169* 173* 225* 212*  --  302*  BUN 34*   < > 35*   < > 31* 35* 39* 58* 70* 87*  --  72*  CREATININE 2.83*   < > 3.54*   < > 3.42* 3.40* 3.67* 5.99* 7.03* 9.02*  --  8.36*  CALCIUM 9.0   < > 8.9   < > 9.2 9.2 9.7 9.9 10.0 9.5  --  9.2  MG 2.8*  --  3.0*  --  3.0* 3.0*  --   --   --   --  3.6*  --   PHOS 4.3   < > 4.4   < > 4.5 4.2  4.5 3.9 5.4* 5.7* 5.0*  --  4.5   < > = values in this interval not displayed.    Liver Function Tests: Recent Labs  Lab 04/12/18 0418  04/13/18 1540  04/16/18 1224 04/16/18 1547 04/17/18 0658 04/17/18 1730 04/18/18 0500 04/18/18 1650  AST 57*  --  49*  --  42*  --   --   --   --   --   ALT 34  --  28  --  27  --   --   --   --   --   ALKPHOS 213*  --  175*  --  197*  --   --   --   --   --   BILITOT 1.1  --  1.2  --  1.4*  --   --   --   --   --   PROT 10.2*  --  9.0*  --  10.8*  --   --   --   --   --   ALBUMIN 2.7*  2.7*   < > 2.4*  2.4*   < > 2.8* 2.7* 2.6* 2.7* 2.5* 2.5*   < > = values in this interval not displayed.   No results for input(s): LIPASE, AMYLASE in the last 168 hours. No results for input(s): AMMONIA in the last 168 hours. Coagulation Profile: Recent Labs  Lab 04/14/18 0308 04/15/18 0439 04/16/18 0348 04/17/18 0658 04/18/18 0753  INR 3.23 3.60 3.49 1.39 1.42   Cardiac Enzymes: No results for input(s): CKTOTAL, CKMB, CKMBINDEX, TROPONINI in the last 168 hours. BNP (last 3 results) No results for input(s): PROBNP in the last 8760 hours. CBG: Recent Labs  Lab 04/17/18 2115 04/18/18 0943 04/18/18 1120 04/18/18 1405 04/18/18 1635  GLUCAP 225* 121* 104* 188* 264*   Studies: Dg Chest Port 1 View  Result Date: 04/18/2018 CLINICAL DATA:   Respiratory failure EXAM: PORTABLE CHEST 1 VIEW COMPARISON:  04/17/2018 FINDINGS: The heart remains mildly enlarged. Left subclavian AICD device is stable. Left jugular central venous catheter is stable with its tip at the upper SVC. Lungs are under aerated and clear. Normal vascularity. IMPRESSION: Cardiomegaly without decompensation. Low lung volumes. No evidence of CHF or pneumonia. Electronically Signed   By: Marybelle Killings M.D.   On: 04/18/2018 15:11     Time spent: 35 minutes  Author: Berle Mull, MD [facility] 04/18/2018 6:31 PM  To reach On-call, see care teams to locate the attending and reach out to them via [email or web address]. If 7PM-7AM, please contact night-coverage If you still have difficulty reaching the attending provider, please  page the [facility] (Director on Call) for [facility] on amion for assistance.

## 2018-04-18 NOTE — Care Management Note (Addendum)
Case Management Note  Patient Details  Name: Travis Bryan MRN: [id number] Date of Birth: 12-12-1970  Subjective/Objective:      Patient does not have PCP , no insurance, NCM scheduled hospital follow up for 3/2 at 3:30 at [place] [facility], he can use the [facility] clinic for discount medications. Match letter given to patient . The dates will need to be changed on the Match letter and in [place] in order for patient to use, he has not used the Match letter yet.   He is now ESRD, will need to be clipped, financial counselor working on [employer] with DSS, awaiting medicaid pending number from [place], plan is for CIR .              Action/Plan: Plan to DC to CIR when clipped  Expected Discharge Date:  03/30/18               Expected Discharge Plan:  [place]  In-House Referral:     Discharge planning Services  CM Consult, Follow-up appt scheduled, Medication Assistance, [facility]  Post Acute Care Choice:    Choice offered to:     DME Arranged:    DME Agency:     HH Arranged:    HH Agency:     Status of Service:  In process, will continue to follow  If discussed at Long Length of Stay Meetings, dates discussed:    Additional Comments:  Zenon Mayo, RN 04/18/2018, 3:09 PM

## 2018-04-18 NOTE — Progress Notes (Signed)
Physical Therapy Treatment Patient Details Name: Travis Bryan MRN: [id number] DOB: 18-Jan-1971 Today's Date: 04/18/2018    History of Present Illness 48 y.o. male admitted with pneumonia and decompensated heart failure. PEA arrest on 1/20, intubated 1/20-2/04.  PMH includes but not limited to: HTN, HLD, CKD III, A fib on coumadin, AICD placement, MI, DM2.     PT Comments    Patient progressing with therapy today, ambulating short distances in room with min A and use of RW. Patient with cont slow processing and flat affect. Cont to rec CIR. Pt satting well on RA with light activity. Rec close chair follow for progressing ambulation.       Follow Up Recommendations  CIR     Equipment Recommendations       Recommendations for Other Services Rehab consult     Precautions / Restrictions Precautions Precautions: Fall Precaution Comments: watch O2 Restrictions Weight Bearing Restrictions: No    Mobility  Bed Mobility Overal bed mobility: Needs Assistance Bed Mobility: Supine to Sit     Supine to sit: Min assist        Transfers Overall transfer level: Needs assistance Equipment used: Rolling walker (2 wheeled);1 person hand held assist Transfers: Sit to/from [facility] Sit to Stand: Min assist         General transfer comment: Min A to come to standing from lower bed today. denies dizziness   Ambulation/Gait Ambulation/Gait assistance: Min guard;Min assist Gait Distance (Feet): 15 Feet Assistive device: Rolling walker (2 wheeled) Gait Pattern/deviations: Step-through pattern;Step-to pattern Gait velocity: decreased   General Gait Details: ambulating short distance in room patient with deconditioning and weakness, RW trialed today. recomend chair follow for progressive ambulation   Stairs             Wheelchair Mobility    Modified Rankin (Stroke Patients Only)       Balance Overall balance assessment: Needs  assistance Sitting-balance support: No upper extremity supported;Feet supported Sitting balance-Leahy Scale: Fair     Standing balance support: During functional activity;Bilateral upper extremity supported Standing balance-Leahy Scale: Poor                              Cognition Arousal/Alertness: Awake/alert Behavior During Therapy: Flat affect Overall Cognitive Status: Impaired/Different from baseline Area of Impairment: Attention;Following commands;Memory;Safety/judgement;Awareness;Problem solving                   Current Attention Level: Sustained Memory: Decreased short-term memory Following Commands: Follows one step commands inconsistently;Follows one step commands with increased time Safety/Judgement: Decreased awareness of safety Awareness: Emergent Problem Solving: Slow processing;Decreased initiation;Requires verbal cues General Comments: cont slow processing and flat affect, follows cues and is AOx4      Exercises      General Comments        Pertinent Vitals/Pain Pain Assessment: No/denies pain    Home Living                      Prior Function            PT Goals (current goals can now be found in the care plan section) Acute Rehab PT Goals Patient Stated Goal: non stated PT Goal Formulation: With patient Time For Goal Achievement: 05/01/18 Potential to Achieve Goals: Good Progress towards PT goals: Progressing toward goals    Frequency    Min 3X/week      PT Plan Current plan  remains appropriate    Co-evaluation              AM-PAC PT "6 Clicks" Mobility   Outcome Measure  Help needed turning from your back to your side while in a flat bed without using bedrails?: A Little Help needed moving from lying on your back to sitting on the side of a flat bed without using bedrails?: A Little Help needed moving to and from a bed to a chair (including a wheelchair)?: A Little Help needed standing up from a  chair using your arms (e.g., wheelchair or bedside chair)?: A Little Help needed to walk in hospital room?: A Lot Help needed climbing 3-5 steps with a railing? : A Lot 6 Click Score: 16    End of Session Equipment Utilized During Treatment: Gait belt Activity Tolerance: Patient tolerated treatment well Patient left: in chair;with bed alarm set Nurse Communication: Mobility status PT Visit Diagnosis: Unsteadiness on feet (R26.81)     Time: 1650-1720 PT Time Calculation (min) (ACUTE ONLY): 30 min  Charges:  $Gait Training: 8-22 mins $Therapeutic Activity: 8-22 mins                     Reinaldo Berber, PT, DPT Acute Rehabilitation Services Pager: [phone] Office: [place] 04/18/2018, 5:06 PM

## 2018-04-18 NOTE — Progress Notes (Signed)
IP rehab admissions - I met with patient and he have me permission to call his aunt/uncle.  Noted patient is now ESRD started on HD.  He was laid off from his job and thus has no insurance.  I will follow progress for now.  Call me for questions.  [phone]

## 2018-04-18 NOTE — Consult Note (Signed)
Physical Medicine and Rehabilitation Consult Reason for Consult:  Decreased functional mobility Referring Physician: Critical care   HPI: Travis Bryan is a 48 y.o.right handed male with history of nonischemic cardiomyopathy/AICD,chronic systolic congestive heart failure, atrial flutter maintained on Coumadin,CKD 3-4, hypertension and diabetes mellitus, quit smoking 15 years ago as well as a recent 4 day hospitalization for pneumonia approximately one month ago. Per chart review patient independent at baseline prior to admission. By report he lives with 2 aunts. Presented 03/28/2018 with low-grade fever 103, tachypneic, creatinine 2.5, BNP 683,lactic acid 0.8, troponin 0.12, urine culture no growth, influenza panel negative, urine drug screen negative. Chest x-ray with cardiomegaly, bilateral pulmonary infiltrates and vascular congestion.Patient developed increasing shortness of breath respiratory failure requiring intubation as well as PEA arrest with follow-up cardiology services. Maintained on broad-spectrum antibiotics. Renal services consulted for increasing creatinine from baseline 2.5-2.8 with latest creatinine 5.99 with CRRT initiated and transitioning to hemodialysis. Patient did remain intubated through 04/16/2018.Patient remains on Coumadin as prior to admission. Currently maintained on a dysphagia #2 Honey thick liquid diet. Therapy evaluations completed 04/17/2018 with recommendations of physical medicine rehabilitation consult.   Review of Systems  Constitutional: Positive for fever.  HENT: Negative for hearing loss.   Eyes: Negative for blurred vision and double vision.  Respiratory: Positive for cough and shortness of breath.   Cardiovascular: Positive for palpitations and leg swelling. Negative for chest pain.  Gastrointestinal: Positive for constipation. Negative for nausea and vomiting.  Genitourinary: Negative for dysuria, flank pain and hematuria.  Musculoskeletal:  Positive for myalgias.  Skin: Negative for rash.  All other systems reviewed and are negative.  Past Medical History:  Diagnosis Date  . AICD (automatic cardioverter/defibrillator) present   . Atrial flutter ([place])    a. s/p ablation 03/2016  . Chronic systolic CHF (congestive heart failure) ([place])   . HCAP (healthcare-associated pneumonia) 03/28/2018  . History of gout   . Hyperlipidemia   . Hypertension   . Myocardial infarction [facility])    "I think I had one a long long time ago" (03/28/2016)  . NICM (nonischemic cardiomyopathy) ([place])    a. LHC 6/06: pLAD 20, pLCx 20-30; b. Echo 5/15:  EF 15%, diffuse HK, restrictive physiology, trivial AI, trivial MR, mild LAE, moderate RVE, moderately reduced RVSF, moderate RAE, mild to moderate TR, PASP 43 mmHg  . Obesity   . Persistent atrial fibrillation   . Type II diabetes mellitus ([place])    Past Surgical History:  Procedure Laterality Date  . CARDIOVERSION N/A 04/07/2016   Procedure: CARDIOVERSION;  Surgeon: Thayer Headings, MD;  Location: [facility] ENDOSCOPY;  Service: Cardiovascular;  Laterality: N/A;  . CARDIOVERSION N/A 04/11/2016   Procedure: CARDIOVERSION;  Surgeon: Larey Dresser, MD;  Location: [place];  Service: Cardiovascular;  Laterality: N/A;  . ELECTROPHYSIOLOGIC STUDY N/A 03/31/2016   Procedure: A-Flutter Ablation;  Surgeon: Evans Lance, MD;  Location: [place] CV LAB;  Service: Cardiovascular;  Laterality: N/A;  . IMPLANTABLE CARDIOVERTER DEFIBRILLATOR (ICD) GENERATOR CHANGE N/A 08/27/2013   Procedure: ICD GENERATOR CHANGE;  Surgeon: Evans Lance, MD;  Location: [facility] CATH LAB;  Service: Cardiovascular;  Laterality: N/A;  . TEE WITHOUT CARDIOVERSION N/A 03/31/2016   Procedure: TRANSESOPHAGEAL ECHOCARDIOGRAM (TEE);  Surgeon: Larey Dresser, MD;  Location: [facility] ENDOSCOPY;  Service: Cardiovascular;  Laterality: N/A;   Family History  Problem Relation Age of Onset  . Hypertension Mother   . Heart disease Mother   . Diabetes Mother  Social History:  reports that he quit smoking about 15 years ago. His smoking use included cigars. He quit after 10.00 years of use. He has never used smokeless tobacco. He reports that he does not drink alcohol or use drugs. Allergies:  Allergies  Allergen Reactions  . No Known Allergies    Medications Prior to Admission  Medication Sig Dispense Refill  . amiodarone (PACERONE) 200 MG tablet Take 1 tablet (200 mg total) by mouth daily. 30 tablet 5  . amLODipine (NORVASC) 5 MG tablet Take 1 tablet (5 mg total) by mouth daily. 30 tablet 6  . carvedilol (COREG) 25 MG tablet TAKE 1 TABLET BY MOUTH TWICE A DAY WITH A MEAL. (Patient taking differently: Take 25 mg by mouth 2 (two) times daily with a meal. ) 60 tablet 3  . colchicine 0.6 MG tablet Take 1 tablet (0.6 mg total) by mouth daily. 30 tablet 0  . hydrALAZINE (APRESOLINE) 100 MG tablet TAKE 1 TABLET BY MOUTH 3 TIMES DAILY. (Patient taking differently: Take 100 mg by mouth 3 (three) times daily. ) 90 tablet 5  . insulin aspart (NOVOLOG) 100 UNIT/ML injection Substitute to any brand approved.Before each meal 3 times a day, 140-199 - 2 units, 200-250 - 4 units, 251-299 - 6 units,  300-349 - 8 units,  350 or above 10 units. Dispense syringes and needles as needed, Ok to switch to PEN if approved. DX DM2, Code E11.65 (Patient taking differently: Inject 10 Units into the skin See admin instructions. Substitute to any brand approved.Before each meal 3 times a day, 140-199 - 2 units, 200-250 - 4 units, 251-299 - 6 units,  300-349 - 8 units,  350 or above 10 units. Dispense syringes and needles as needed, Ok to switch to PEN if approved. DX DM2, Code E11.65) 1 vial 0  . insulin glargine (LANTUS) 100 UNIT/ML injection Inject 0.25 mLs (25 Units total) into the skin 2 (two) times daily. Dispense insulin pen if approved, if not dispense as needed syringes and needles for 1 month supply. Can switch to Levemir. Diagnosis E 11.65. (Patient taking differently:  Inject 25 Units into the skin at bedtime. ) 10 mL 0  . potassium chloride SA (K-DUR,KLOR-CON) 20 MEQ tablet TAKE 2 TABLETS BY MOUTH IN THE MORNING AND 1 TABLET IN THE EVENING (Patient taking differently: Take 40 mEq by mouth See admin instructions. TAKE 40 mg  IN THE MORNING AND 20 mg TABLET IN THE EVENING) 90 tablet 5  . ranolazine (RANEXA) 500 MG 12 hr tablet TAKE 1 TABLET (500 MG TOTAL) BY MOUTH 2 (TWO) TIMES DAILY. 60 tablet 5  . torsemide (DEMADEX) 20 MG tablet Take 3 tablets (60 mg total) by mouth daily. 90 tablet 5  . warfarin (COUMADIN) 5 MG tablet Take 1 tablet (5 mg total) by mouth daily at 6 PM. Take As Directed by Coumadin Clinic (Patient taking differently: Take 5-7.5 mg by mouth daily at 6 PM. Take 7.5 mg on Mon. Wed. Fri Take 5 mg tues, thurs, sat. sunday)    . allopurinol (ZYLOPRIM) 300 MG tablet Take 300 mg by mouth daily.     Marland Kitchen amoxicillin-clavulanate (AUGMENTIN) 875-125 MG tablet Take 1 tablet by mouth every 12 (twelve) hours. (Patient not taking: Reported on 03/28/2018) 20 tablet 0  . atorvastatin (LIPITOR) 40 MG tablet Take 1 tablet (40 mg total) by mouth daily. (Patient not taking: Reported on 03/28/2018) 90 tablet 3  . blood glucose meter kit and supplies KIT Dispense based on patient  and insurance preference. Use up to four times daily as directed. (FOR ICD-9 250.00, 250.01). For QAC - HS accuchecks. 1 each 1  . calcitRIOL (ROCALTROL) 0.25 MCG capsule Take 0.25 mcg by mouth daily.    . diclofenac sodium (VOLTAREN) 1 % GEL Apply 2 g topically 4 (four) times daily. 1 Tube 0  . Insulin Syringe-Needle U-100 25G X 1" 1 ML MISC For 4 times a day insulin SQ, 1 month supply. Diagnosis E11.65 30 each 0  . Insulin Syringe-Needle U-100 31G X 15/64" 0.3 ML MISC Use to inject insulin 4 times a day 200 each 12  . isosorbide mononitrate (IMDUR) 30 MG 24 hr tablet TAKE 3 TABLETS (90 MG TOTAL) BY MOUTH DAILY. 270 tablet 3  . magnesium oxide (MAG-OX) 400 (241.3 Mg) MG tablet Take 1 tablet (400 mg  total) by mouth daily. 30 tablet 6  . metolazone (ZAROXOLYN) 2.5 MG tablet TAKE 1 TABLET BY MOUTH AS NEEDED AS DIRECTED (Patient taking differently: Take 2.5 mg by mouth as needed (fluid). ) 5 tablet 0    Home: Home Living Family/patient expects to be discharged to:: Private residence Living Arrangements: Other relatives Type of Home: House Additional Comments: Patient vauge with responses for home set up and PLOF. Pt reporting he lives with to aunts.  Functional History: Prior Function Level of Independence: Independent Comments: Pt reporting he was independent and was looking for a job.  Functional Status:  Mobility: Bed Mobility Overal bed mobility: Needs Assistance Bed Mobility: Supine to Sit Supine to sit: Min assist General bed mobility comments: Pt at EOB with PT Transfers Overall transfer level: Needs assistance Equipment used: 2 person hand held assist Transfers: Sit to/from Stand, Stand Pivot Transfers Sit to Stand: Min assist, +2 physical assistance Stand pivot transfers: Min assist, +2 physical assistance General transfer comment: Min A +2 for power up into standing. Pt requiring Min A +2 and two person hand held assist.  Ambulation/Gait General Gait Details: deferred to tolerance     ADL: ADL Overall ADL's : Needs assistance/impaired Eating/Feeding: Supervision/ safety, Set up, Sitting Eating/Feeding Details (indicate cue type and reason): Full supervision required Grooming: Oral care, Min guard, Cueing for sequencing, Cueing for safety, Sitting Grooming Details (indicate cue type and reason): Pt sitting at EOB with Min Guard A to perform oral care. Noting undershooting as pt would reach for swab or cup. Pt denies double vision. Pt requiring cues for sequencing.  Upper Body Bathing: Min guard, Sitting Lower Body Bathing: Minimal assistance, +2 for safety/equipment, Sit to/from stand Upper Body Dressing : Min guard, Sitting Lower Body Dressing: Moderate  assistance, Sit to/from stand Lower Body Dressing Details (indicate cue type and reason): Mod A due to poor coorindation and vision for donning socks Toilet Transfer: Minimal assistance, +2 for physical assistance, Stand-pivot(simulated to recliner) Functional mobility during ADLs: Minimal assistance, +2 for physical assistance, +2 for safety/equipment, Cueing for safety, Cueing for sequencing General ADL Comments: Pt presenting with decreased cognition, balance, strength, vision, and activity tolerance. Motivated to participate with therapy to get OOB to recliner  Cognition: Cognition Overall Cognitive Status: Impaired/Different from baseline Orientation Level: Oriented to person, Oriented to place, Disoriented to time, Disoriented to situation Cognition Arousal/Alertness: Awake/alert Behavior During Therapy: Flat affect Overall Cognitive Status: Impaired/Different from baseline Area of Impairment: Attention, Following commands, Memory, Safety/judgement, Awareness, Problem solving Current Attention Level: Sustained Memory: Decreased short-term memory Following Commands: Follows one step commands inconsistently, Follows one step commands with increased time Safety/Judgement: Decreased awareness of safety  Awareness: Emergent Problem Solving: Slow processing, Decreased initiation, Requires verbal cues General Comments: Pt oriented to place, time, and sitation. Pt requiring increased time and cues. Decreased attention to task and poor problem solving.   Blood pressure 96/61, pulse 73, temperature (!) 97.4 F (36.3 C), temperature source Oral, resp. rate 18, height '5\' 10"'$  (1.778 m), weight 94.8 kg, SpO2 95 %. Physical Exam  Constitutional: No distress.  HENT:  Head: Normocephalic.  Eyes: Pupils are equal, round, and reactive to light.  Neck: Normal range of motion.  Cardiovascular: Normal rate.  Respiratory: Effort normal.  GI: He exhibits distension.  Musculoskeletal:        General:  Edema present.  Neurological:  Patient is lethargic but arousable. Mood is very flat.Patient with dysphonia. He does make eye contact with examiner. Follows some simple commands. He was able to state his first name and age. Exam overall limited due to lethargy. UE 3/5. LE 2-3/5 prox to distal. Withdraws to pain.   Skin: Skin is warm.  Psychiatric:  flat    Results for orders placed or performed during the hospital encounter of 03/28/18 (from the past 24 hour(s))  Protime-INR     Status: Abnormal   Collection Time: 04/17/18  6:58 AM  Result Value Ref Range   Prothrombin Time 16.9 (H) 11.4 - 15.2 seconds   INR 1.39   Renal function panel     Status: Abnormal   Collection Time: 04/17/18  6:58 AM  Result Value Ref Range   Sodium 136 135 - 145 mmol/L   Potassium 3.6 3.5 - 5.1 mmol/L   Chloride 95 (L) 98 - 111 mmol/L   CO2 19 (L) 22 - 32 mmol/L   Glucose, Bld 173 (H) 70 - 99 mg/dL   BUN 58 (H) 6 - 20 mg/dL   Creatinine, Ser 5.99 (H) 0.61 - 1.24 mg/dL   Calcium 9.9 8.9 - 10.3 mg/dL   Phosphorus 5.4 (H) 2.5 - 4.6 mg/dL   Albumin 2.6 (L) 3.5 - 5.0 g/dL   GFR calc non Af Amer 10 (L) >60 mL/min   GFR calc Af Amer 12 (L) >60 mL/min   Anion gap 22 (H) 5 - 15  CBC     Status: Abnormal   Collection Time: 04/17/18  6:58 AM  Result Value Ref Range   WBC 10.3 4.0 - 10.5 K/uL   RBC 4.40 4.22 - 5.81 MIL/uL   Hemoglobin 12.9 (L) 13.0 - 17.0 g/dL   HCT 41.6 39.0 - 52.0 %   MCV 94.5 80.0 - 100.0 fL   MCH 29.3 26.0 - 34.0 pg   MCHC 31.0 30.0 - 36.0 g/dL   RDW 16.2 (H) 11.5 - 15.5 %   Platelets 354 150 - 400 K/uL   nRBC 0.4 (H) 0.0 - 0.2 %  Glucose, capillary     Status: Abnormal   Collection Time: 04/17/18  7:42 AM  Result Value Ref Range   Glucose-Capillary 157 (H) 70 - 99 mg/dL  Glucose, capillary     Status: Abnormal   Collection Time: 04/17/18 11:43 AM  Result Value Ref Range   Glucose-Capillary 172 (H) 70 - 99 mg/dL  Glucose, capillary     Status: Abnormal   Collection Time: 04/17/18   5:05 PM  Result Value Ref Range   Glucose-Capillary 229 (H) 70 - 99 mg/dL  Heparin level (unfractionated)     Status: Abnormal   Collection Time: 04/17/18  5:25 PM  Result Value Ref Range   Heparin Unfractionated  0.12 (L) 0.30 - 0.70 IU/mL  Renal function panel (daily at 1600)     Status: Abnormal   Collection Time: 04/17/18  5:30 PM  Result Value Ref Range   Sodium 135 135 - 145 mmol/L   Potassium 3.7 3.5 - 5.1 mmol/L   Chloride 96 (L) 98 - 111 mmol/L   CO2 15 (L) 22 - 32 mmol/L   Glucose, Bld 225 (H) 70 - 99 mg/dL   BUN 70 (H) 6 - 20 mg/dL   Creatinine, Ser 7.03 (H) 0.61 - 1.24 mg/dL   Calcium 10.0 8.9 - 10.3 mg/dL   Phosphorus 5.7 (H) 2.5 - 4.6 mg/dL   Albumin 2.7 (L) 3.5 - 5.0 g/dL   GFR calc non Af Amer 8 (L) >60 mL/min   GFR calc Af Amer 10 (L) >60 mL/min   Anion gap 24 (H) 5 - 15  Glucose, capillary     Status: Abnormal   Collection Time: 04/17/18  9:15 PM  Result Value Ref Range   Glucose-Capillary 225 (H) 70 - 99 mg/dL   Dg Chest Port 1 View  Result Date: 04/17/2018 CLINICAL DATA:  Respiratory failure EXAM: PORTABLE CHEST 1 VIEW COMPARISON:  Yesterday FINDINGS: Single chamber ICD lead into the right ventricle. Low volume chest but improved inflation and decreasing atelectasis. Left IJ line in stable position. The trachea and esophagus have been extubated. IMPRESSION: Improved inflation after extubation. Lung volumes remain low and there is still lower lobe atelectasis. Electronically Signed   By: Monte Fantasia M.D.   On: 04/17/2018 08:32   Dg Swallowing Func-speech Pathology  Result Date: 04/17/2018 Objective Swallowing Evaluation: Type of Study: MBS-Modified Barium Swallow Study  Patient Details Name: Kail Fraley MRN: [id number] Date of Birth: 1970-11-08 Today's Date: 04/17/2018 Time: SLP Start Time (ACUTE ONLY): 1110 -SLP Stop Time (ACUTE ONLY): 1130 SLP Time Calculation (min) (ACUTE ONLY): 20 min Past Medical History: Past Medical History: Diagnosis Date . AICD  (automatic cardioverter/defibrillator) present  . Atrial flutter ([place])   a. s/p ablation 03/2016 . Chronic systolic CHF (congestive heart failure) ([place])  . HCAP (healthcare-associated pneumonia) 03/28/2018 . History of gout  . Hyperlipidemia  . Hypertension  . Myocardial infarction [facility])   "I think I had one a long long time ago" (03/28/2016) . NICM (nonischemic cardiomyopathy) ([place])   a. LHC 6/06: pLAD 20, pLCx 20-30; b. Echo 5/15:  EF 15%, diffuse HK, restrictive physiology, trivial AI, trivial MR, mild LAE, moderate RVE, moderately reduced RVSF, moderate RAE, mild to moderate TR, PASP 43 mmHg . Obesity  . Persistent atrial fibrillation  . Type II diabetes mellitus ([place])  Past Surgical History: Past Surgical History: Procedure Laterality Date . CARDIOVERSION N/A 04/07/2016  Procedure: CARDIOVERSION;  Surgeon: Thayer Headings, MD;  Location: [facility] ENDOSCOPY;  Service: Cardiovascular;  Laterality: N/A; . CARDIOVERSION N/A 04/11/2016  Procedure: CARDIOVERSION;  Surgeon: Larey Dresser, MD;  Location: [place];  Service: Cardiovascular;  Laterality: N/A; . ELECTROPHYSIOLOGIC STUDY N/A 03/31/2016  Procedure: A-Flutter Ablation;  Surgeon: Evans Lance, MD;  Location: [place] CV LAB;  Service: Cardiovascular;  Laterality: N/A; . IMPLANTABLE CARDIOVERTER DEFIBRILLATOR (ICD) GENERATOR CHANGE N/A 08/27/2013  Procedure: ICD GENERATOR CHANGE;  Surgeon: Evans Lance, MD;  Location: [facility] CATH LAB;  Service: Cardiovascular;  Laterality: N/A; . TEE WITHOUT CARDIOVERSION N/A 03/31/2016  Procedure: TRANSESOPHAGEAL ECHOCARDIOGRAM (TEE);  Surgeon: Larey Dresser, MD;  Location: [facility] ENDOSCOPY;  Service: Cardiovascular;  Laterality: N/A; HPI: Pt is a 48 year old male with HTN, cardiomyopathy with AICD,  atrial fibrillation, CHF and diabetes presenting with a one day history of chills and vomiting. Pt admitted 03/29/18 for acute hypoxemic respiratory failure secondary to CHF exacerbation and pneumonia. Suffered PEA arrest on 1/20 after  episode of emesis and likely aspiration.  He was previously admitted with DKA and pneumonia in 12/19 with a similar presentation. Intubated 1/20- 2/4. CXR 1/30 revealed: Bilateral LOWER lobe consolidation involving the majority of the RIGHT LOWER lobe and much of the LEFT LOWER lobe, likely representing pneumonia. Mild bibasilar atelectasis. Mild bilateral ground-glass pulmonary opacities which are nonspecific but may represent mild edema. No pleural effusion or pneumothorax.  No data recorded Assessment / Plan / Recommendation CHL IP CLINICAL IMPRESSIONS 04/17/2018 Clinical Impression Pt demonstrates moderate oropharyngeal dysphagia with sensory deficits and decreased laryngeal closure leading to silent aspriation before and during the swallow with liquids thinner than honey. Decreasing nectar thick bolus to teaspoon size to contain bolus in the valleculae prior to swallow initiation is also a helpful strategy, Otherwise, liquids spill into the vestibule and fall past the cords before/during the swallow with intermittent delayed cough. Honey thick liquids tolerated with penetration or very trace aspiration even if bolus quite large. Cued throat clear ejects material. Oral phase prolonged due to poor dentition but may be baseline. Given pts deconditioning and high risk for fatigue will initaite a dys 2 diet with honey thick liquids. As mentation and generalized strength improve and vocal quality become clearer would expect improvement in timing and airway protection. In the meantime will train pt in small sips and throat clearing as compensatory strategies.  SLP Visit Diagnosis Dysphagia, oropharyngeal phase (R13.12) Attention and concentration deficit following -- Frontal lobe and executive function deficit following -- Impact on safety and function Moderate aspiration risk   CHL IP TREATMENT RECOMMENDATION 04/17/2018 Treatment Recommendations Therapy as outlined in treatment plan below   No flowsheet data found. CHL  IP DIET RECOMMENDATION 04/17/2018 SLP Diet Recommendations Dysphagia 2 (Fine chop) solids;Honey thick liquids Liquid Administration via Cup Medication Administration Whole meds with puree Compensations Slow rate;Small sips/bites;Clear throat after each swallow Postural Changes Seated upright at 90 degrees   CHL IP OTHER RECOMMENDATIONS 04/17/2018 Recommended Consults -- Oral Care Recommendations Oral care BID Other Recommendations --   CHL IP FOLLOW UP RECOMMENDATIONS 04/17/2018 Follow up Recommendations Skilled Nursing facility   [facility] IP FREQUENCY AND DURATION 04/17/2018 Speech Therapy Frequency (ACUTE ONLY) min 2x/week Treatment Duration 2 weeks      CHL IP ORAL PHASE 04/17/2018 Oral Phase Impaired Oral - Pudding Teaspoon -- Oral - Pudding Cup -- Oral - Honey Teaspoon -- Oral - Honey Cup WFL Oral - Nectar Teaspoon WFL Oral - Nectar Cup WFL Oral - Nectar Straw WFL Oral - Thin Teaspoon -- Oral - Thin Cup -- Oral - Thin Straw -- Oral - Puree WFL Oral - Mech Soft Delayed oral transit;Impaired mastication Oral - Regular -- Oral - Multi-Consistency -- Oral - Pill -- Oral Phase - Comment --  CHL IP PHARYNGEAL PHASE 04/17/2018 Pharyngeal Phase Impaired Pharyngeal- Pudding Teaspoon -- Pharyngeal -- Pharyngeal- Pudding Cup -- Pharyngeal -- Pharyngeal- Honey Teaspoon -- Pharyngeal -- Pharyngeal- Honey Cup Delayed swallow initiation-pyriform sinuses;Delayed swallow initiation-vallecula;Reduced airway/laryngeal closure;Penetration/Aspiration before swallow;Penetration/Aspiration during swallow;Trace aspiration;Compensatory strategies attempted (with notebox) Pharyngeal Material does not enter airway;Material enters airway, CONTACTS cords and then ejected out;Material enters airway, CONTACTS cords and not ejected out;Material enters airway, passes BELOW cords without attempt by patient to eject out (silent aspiration) Pharyngeal- Nectar Teaspoon Delayed swallow initiation-vallecula;Reduced airway/laryngeal closure Pharyngeal  Material does  not enter airway Pharyngeal- Nectar Cup Delayed swallow initiation-pyriform sinuses;Delayed swallow initiation-vallecula;Reduced airway/laryngeal closure;Penetration/Aspiration before swallow;Penetration/Aspiration during swallow;Moderate aspiration Pharyngeal Material enters airway, passes BELOW cords without attempt by patient to eject out (silent aspiration);Material enters airway, passes BELOW cords and not ejected out despite cough attempt by patient Pharyngeal- Nectar Straw Delayed swallow initiation-pyriform sinuses;Delayed swallow initiation-vallecula;Reduced airway/laryngeal closure;Penetration/Aspiration before swallow;Penetration/Aspiration during swallow;Trace aspiration;Compensatory strategies attempted (with notebox) Pharyngeal Material enters airway, passes BELOW cords without attempt by patient to eject out (silent aspiration) Pharyngeal- Thin Teaspoon -- Pharyngeal -- Pharyngeal- Thin Cup -- Pharyngeal -- Pharyngeal- Thin Straw -- Pharyngeal -- Pharyngeal- Puree Delayed swallow initiation-vallecula Pharyngeal -- Pharyngeal- Mechanical Soft Delayed swallow initiation-vallecula Pharyngeal -- Pharyngeal- Regular -- Pharyngeal -- Pharyngeal- Multi-consistency -- Pharyngeal -- Pharyngeal- Pill -- Pharyngeal -- Pharyngeal Comment --  No flowsheet data found. Herbie Baltimore, MA CCC-SLP Acute Rehabilitation Services Pager [phone] Office [phone] Lynann Beaver 04/17/2018, 12:01 PM                Assessment/Plan: Diagnosis: debility related to bilateral pneumonia with respiratory failure, PEA cardiac arrest, renal faliure 1. Does the need for close, 24 hr/day medical supervision in concert with the patient's rehab needs make it unreasonable for this patient to be served in a less intensive setting? Yes 2. Co-Morbidities requiring supervision/potential complications: NICM, CKD, HTN, DM 3. Due to bladder management, bowel management, safety, skin/wound care, disease management,  medication administration, pain management and patient education, does the patient require 24 hr/day rehab nursing? Yes 4. Does the patient require coordinated care of a physician, rehab nurse, PT (1-2 hrs/day, 5 days/week), OT (1-2 hrs/day, 5 days/week) and SLP (1-2 hrs/day, 5 days/week) to address physical and functional deficits in the context of the above medical diagnosis(es)? Yes Addressing deficits in the following areas: balance, endurance, locomotion, strength, transferring, bowel/bladder control, bathing, dressing, feeding, grooming, toileting, cognition and psychosocial support 5. Can the patient actively participate in an intensive therapy program of at least 3 hrs of therapy per day at least 5 days per week? Yes 6. The potential for patient to make measurable gains while on inpatient rehab is excellent 7. Anticipated functional outcomes upon discharge from inpatient rehab are modified independent and supervision  with PT, modified independent and supervision with OT, modified independent with SLP. 8. Estimated rehab length of stay to reach the above functional goals is: 11-18 days 9. Anticipated D/C setting: Home 10. Anticipated post D/C treatments: HH therapy and Outpatient therapy 11. Overall Rehab/Functional Prognosis: excellent  RECOMMENDATIONS: This patient's condition is appropriate for continued rehabilitative care in the following setting: CIR Patient has agreed to participate in recommended program. Yes Note that insurance prior authorization may be required for reimbursement for recommended care.  Comment: Rehab Admissions Coordinator to follow up.  Thanks,  Meredith Staggers, MD, Mellody Drown  I have personally performed a face to face diagnostic evaluation of this patient. Additionally, I have reviewed and concur with the physician assistant's documentation above.    Lavon Paganini Angiulli, PA-C 04/18/2018

## 2018-04-19 LAB — RENAL FUNCTION PANEL
Albumin: 2.5 g/dL — ABNORMAL LOW (ref 3.5–5.0)
Albumin: 2.5 g/dL — ABNORMAL LOW (ref 3.5–5.0)
Anion gap: 18 — ABNORMAL HIGH (ref 5–15)
Anion gap: 21 — ABNORMAL HIGH (ref 5–15)
BUN: 83 mg/dL — ABNORMAL HIGH (ref 6–20)
BUN: 90 mg/dL — ABNORMAL HIGH (ref 6–20)
CALCIUM: 9.3 mg/dL (ref 8.9–10.3)
CO2: 18 mmol/L — ABNORMAL LOW (ref 22–32)
CO2: 18 mmol/L — ABNORMAL LOW (ref 22–32)
CREATININE: 9.46 mg/dL — AB (ref 0.61–1.24)
Calcium: 9.5 mg/dL (ref 8.9–10.3)
Chloride: 94 mmol/L — ABNORMAL LOW (ref 98–111)
Chloride: 92 mmol/L — ABNORMAL LOW (ref 98–111)
Creatinine, Ser: 10.97 mg/dL — ABNORMAL HIGH (ref 0.61–1.24)
GFR calc Af Amer: 7 mL/min — ABNORMAL LOW (ref >60)
GFR calc Af Amer: 6 mL/min — ABNORMAL LOW (ref >60)
GFR calc non Af Amer: 6 mL/min — ABNORMAL LOW (ref >60)
GFR calc non Af Amer: 5 mL/min — ABNORMAL LOW (ref >60)
Glucose, Bld: 306 mg/dL — ABNORMAL HIGH (ref 70–99)
Glucose, Bld: 296 mg/dL — ABNORMAL HIGH (ref 70–99)
POTASSIUM: 3.7 mmol/L (ref 3.5–5.1)
Phosphorus: 5.6 mg/dL — ABNORMAL HIGH (ref 2.5–4.6)
Phosphorus: 5.2 mg/dL — ABNORMAL HIGH (ref 2.5–4.6)
Potassium: 3.8 mmol/L (ref 3.5–5.1)
Sodium: 130 mmol/L — ABNORMAL LOW (ref 135–145)
Sodium: 131 mmol/L — ABNORMAL LOW (ref 135–145)

## 2018-04-19 LAB — CBC
HCT: 39.7 % (ref 39.0–52.0)
Hemoglobin: 12.8 g/dL — ABNORMAL LOW (ref 13.0–17.0)
MCH: 29.6 pg (ref 26.0–34.0)
MCHC: 32.2 g/dL (ref 30.0–36.0)
MCV: 91.9 fL (ref 80.0–100.0)
Platelets: 324 10*3/uL (ref 150–400)
RBC: 4.32 MIL/uL (ref 4.22–5.81)
RDW: 16.2 % — AB (ref 11.5–15.5)
WBC: 8.5 10*3/uL (ref 4.0–10.5)
nRBC: 0 % (ref 0.0–0.2)

## 2018-04-19 LAB — PROTIME-INR
INR: 1.21
Prothrombin Time: 15.2 seconds (ref 11.4–15.2)

## 2018-04-19 LAB — HEPARIN LEVEL (UNFRACTIONATED)
Heparin Unfractionated: 0.63 IU/mL (ref 0.30–0.70)
Heparin Unfractionated: 0.62 IU/mL (ref 0.30–0.70)

## 2018-04-19 LAB — GLUCOSE, CAPILLARY
Glucose-Capillary: 229 mg/dL — ABNORMAL HIGH (ref 70–99)
Glucose-Capillary: 256 mg/dL — ABNORMAL HIGH (ref 70–99)
Glucose-Capillary: 289 mg/dL — ABNORMAL HIGH (ref 70–99)

## 2018-04-19 LAB — HEPATITIS B SURFACE ANTIBODY, QUANTITATIVE: Hep B S AB Quant (Post): <3.1 m[IU]/mL — ABNORMAL LOW (ref >9.9)

## 2018-04-19 MED ORDER — INSULIN GLARGINE 100 UNIT/ML ~~LOC~~ SOLN
12.0000 [IU] | Freq: Every day | SUBCUTANEOUS | Status: DC
  Administered 2018-04-19 – 2018-04-21 (×3): 12 [IU] via SUBCUTANEOUS
  Filled 2018-04-19 (×4): qty 0.12

## 2018-04-19 MED ORDER — ALLOPURINOL 100 MG PO TABS
100.0000 mg | ORAL_TABLET | Freq: Every day | ORAL | Status: DC
  Administered 2018-04-20 – 2018-04-22 (×3): 100 mg
  Filled 2018-04-19 (×3): qty 1

## 2018-04-19 MED ORDER — SEVELAMER CARBONATE 800 MG PO TABS
800.0000 mg | ORAL_TABLET | Freq: Three times a day (TID) | ORAL | Status: DC
  Administered 2018-04-19 – 2018-04-26 (×15): 800 mg via ORAL
  Filled 2018-04-19 (×21): qty 1

## 2018-04-19 NOTE — Progress Notes (Signed)
Inpatient Diabetes Program Recommendations  AACE/ADA: New Consensus Statement on Inpatient Glycemic Control (2015)  Target Ranges:  Prepandial:   less than 140 mg/dL      Peak postprandial:   less than 180 mg/dL (1-2 hours)      Critically ill patients:  140 - 180 mg/dL   Lab Results  Component Value Date   GLUCAP 229 (H) 04/19/2018   HGBA1C 13.3 (H) 02/27/2018    Review of Glycemic Control Results for AUTHOR, HATLESTAD (MRN [id number]) as of 04/19/2018 10:43  Ref. Range 04/18/2018 14:05 04/18/2018 16:35 04/18/2018 21:09 04/19/2018 07:51  Glucose-Capillary Latest Ref Range: 70 - 99 mg/dL 188 (H) 264 (H) 321 (H) 229 (H)   Diabetes history: Type 2 DM Outpatient Diabetes medications: Novolog 0-9 units TID, Lantus 25 units QHS Current orders for Inpatient glycemic control: Novolog 0-9 units TID  Inpatient Diabetes Program Recommendations:    Since changing correction, glucose trends are increased above inpatient goals.  Would recommend adding Lantus to inpatient regimen: Lantus 10 units QHS.   Thanks, Bronson Curb, MSN, RNC-OB Diabetes Coordinator [phone] (8a-5p)

## 2018-04-19 NOTE — Progress Notes (Signed)
  Speech Language Pathology Treatment: Dysphagia  Patient Details Name: Travis Bryan MRN: [id number] DOB: 1970-03-20 Today's Date: 04/19/2018 Time: 0315-9458 SLP Time Calculation (min) (ACUTE ONLY): 14 min  Assessment / Plan / Recommendation Clinical Impression  Pt consumed honey thick liquids given Min verbal reminders to utilize small sips and a throat clear post-swallow to increase safety with intake. Pt utilized strategies well but did not want to consume much, not liking the thickened liquids. He also asked about his voice "coming back." Education was provided about typical post-extubation changes to voicing and swallowing, including prognosis. Will continue to follow for readiness to repeat swallow study. Would continue current diet and precautions for now.   HPI HPI: Pt is a 48 year old male with HTN, cardiomyopathy with AICD, atrial fibrillation, CHF and diabetes presenting with a one day history of chills and vomiting. Pt admitted 03/29/18 for acute hypoxemic respiratory failure secondary to CHF exacerbation and pneumonia. Suffered PEA arrest on 1/20 after episode of emesis and likely aspiration.  He was previously admitted with DKA and pneumonia in 12/19 with a similar presentation. Intubated 1/20- 2/4. CXR 1/30 revealed: Bilateral LOWER lobe consolidation involving the majority of the RIGHT LOWER lobe and much of the LEFT LOWER lobe, likely representing pneumonia. Mild bibasilar atelectasis. Mild bilateral ground-glass pulmonary opacities which are nonspecific but may represent mild edema. No pleural effusion or pneumothorax.      SLP Plan  Continue with current plan of care       Recommendations  Diet recommendations: Dysphagia 2 (fine chop);Honey-thick liquid Liquids provided via: Cup Medication Administration: Whole meds with puree Supervision: Patient able to self feed;Full supervision/cueing for compensatory strategies Compensations: Slow rate;Small sips/bites;Clear throat  after each swallow Postural Changes and/or Swallow Maneuvers: Seated upright 90 degrees                Oral Care Recommendations: Oral care BID Follow up Recommendations: Skilled Nursing facility SLP Visit Diagnosis: Dysphagia, oropharyngeal phase (R13.12) Plan: Continue with current plan of care       GO                Travis Bryan Travis Bryan 04/19/2018, 2:16 PM  Nuala Alpha, M.A. [place] Acute Environmental education officer [phone] Office [phone]

## 2018-04-19 NOTE — Progress Notes (Signed)
[facility]    NEPHROLOGY PROGRESS NOTE  SUBJECTIVE: Doing quite well today.  Denies any headaches, fevers, chills, chest pain, shortness of breath, nausea, vomiting, diarrhea or dysuria.  Denies any skin rash, arthralgias or myalgias.  All other review of systems are negative.    OBJECTIVE:  Vitals:   04/19/18 1201 04/19/18 1300  BP: 109/83   Pulse: 78 75  Resp: (!) 22 (!) 21  Temp: 97.6 F (36.4 C)   SpO2: 100% 98%    Intake/Output Summary (Last 24 hours) at 04/19/2018 1318 Last data filed at 04/19/2018 0825 Gross per 24 hour  Intake 736.5 ml  Output 50 ml  Net 686.5 ml      Genearl:  AAOx3 NAD HEENT: MMM [place] AT anicteric sclera Neck:  No JVD, no adenopathy, left IJ temporary dialysis catheter CV:  Heart RRR  Lungs:  L/S CTA bilaterally Abd:  abd SNT/ND with normal BS GU:  Bladder non-palpable Extremities:  No LE edema. Skin:  No skin rash  MEDICATIONS:   Current Facility-Administered Medications:  .  0.9 %  sodium chloride infusion, , Intravenous, PRN, Minor, Grace Bushy, NP, Last Rate: 10 mL/hr at 04/17/18 0036 .  0.9 %  sodium chloride infusion, 100 mL, Intravenous, PRN, Madelon Lips, MD .  0.9 %  sodium chloride infusion, 100 mL, Intravenous, PRN, Madelon Lips, MD .  acetaminophen (TYLENOL) tablet 650 mg, 650 mg, Per Tube, Q6H PRN **OR** acetaminophen (TYLENOL) suppository 650 mg, 650 mg, Rectal, Q6H PRN, Minor, Grace Bushy, NP .  albuterol (PROVENTIL) (2.5 MG/3ML) 0.083% nebulizer solution 2.5 mg, 2.5 mg, Nebulization, TID, Mannam, Praveen, MD, 2.5 mg at 04/19/18 1300 .  [START ON 04/20/2018] allopurinol (ZYLOPRIM) tablet 100 mg, 100 mg, Per Tube, Daily, Swayze, Ava, DO .  amiodarone (PACERONE) tablet 200 mg, 200 mg, Per Tube, Daily, Minor, Grace Bushy, NP, 200 mg at 04/19/18 1028 .  atorvastatin (LIPITOR) tablet 40 mg, 40 mg, Per Tube, q1800, Minor, Grace Bushy, NP, 40 mg at 04/18/18 1858 .  bisacodyl (DULCOLAX) suppository 10 mg, 10 mg, Rectal,  Daily PRN, Minor, Grace Bushy, NP, 10 mg at 04/06/18 0049 .  Chlorhexidine Gluconate Cloth 2 % PADS 6 each, 6 each, Topical, Daily, Minor, Grace Bushy, NP, 6 each at 04/18/18 2057 .  Chlorhexidine Gluconate Cloth 2 % PADS 6 each, 6 each, Topical, Q0600, Madelon Lips, MD, 6 each at 04/19/18 [phone]  diphenhydrAMINE (BENADRYL) injection 25 mg, 25 mg, Intravenous, Q6H PRN, Minor, Grace Bushy, NP, 25 mg at 04/12/18 1633 .  docusate (COLACE) 50 MG/5ML liquid 50 mg, 50 mg, Per Tube, BID, Minor, Grace Bushy, NP, 50 mg at 04/18/18 2056 .  heparin ADULT infusion 100 units/mL (25000 units/264mL sodium chloride 0.45%), 1,200 Units/hr, Intravenous, Continuous, Mancheril, Darnell Level, RPH, Last Rate: 12 mL/hr at 04/19/18 0614, 1,200 Units/hr at 04/19/18 1751 .  heparin injection 1,000 Units, 1,000 Units, Dialysis, PRN, Madelon Lips, MD .  insulin aspart (novoLOG) injection 0-9 Units, 0-9 Units, Subcutaneous, TID WC, Minor, Grace Bushy, NP, 3 Units at 04/19/18 0820 .  lidocaine (PF) (XYLOCAINE) 1 % injection 5 mL, 5 mL, Intradermal, PRN, Madelon Lips, MD .  lidocaine-prilocaine (EMLA) cream 1 application, 1 application, Topical, PRN, Madelon Lips, MD .  lip balm (BLISTEX) ointment, , Topical, PRN, Minor, Grace Bushy, NP .  lip balm (CARMEX) ointment, , Topical, PRN, Minor, Grace Bushy, NP, 1 application at 02/58/52 1341 .  MEDLINE mouth rinse, 15 mL, Mouth Rinse, BID, Minor, Grace Bushy, NP, 15  mL at 04/18/18 2057 .  nystatin (MYCOSTATIN) 100000 UNIT/ML suspension 500,000 Units, 5 mL, Oral, QID, Minor, Grace Bushy, NP, 500,000 Units at 04/19/18 1029 .  ondansetron (ZOFRAN) injection 4 mg, 4 mg, Intravenous, Q6H PRN, 4 mg at 03/29/18 0552 **OR** [DISCONTINUED] ondansetron (ZOFRAN) tablet 4 mg, 4 mg, Oral, Q6H PRN, Isabelle Course, MD .  pantoprazole sodium (PROTONIX) 40 mg/20 mL oral suspension 40 mg, 40 mg, Per Tube, Daily, Minor, Grace Bushy, NP, 40 mg at 04/16/18 0949 .  pentafluoroprop-tetrafluoroeth (GEBAUERS)  aerosol 1 application, 1 application, Topical, PRN, Madelon Lips, MD .  polyethylene glycol (MIRALAX / GLYCOLAX) packet 17 g, 17 g, Per Tube, Daily, Minor, Grace Bushy, NP, 17 g at 04/18/18 1429 .  RESOURCE THICKENUP CLEAR, , Oral, PRN, Purohit, Shrey C, MD .  sennosides (SENOKOT) 8.8 MG/5ML syrup 5 mL, 5 mL, Per Tube, BID, Minor, Grace Bushy, NP, 5 mL at 04/18/18 2055 .  sodium chloride flush (NS) 0.9 % injection 10-40 mL, 10-40 mL, Intracatheter, Q12H, Minor, Grace Bushy, NP, 10 mL at 04/18/18 2056 .  sodium chloride flush (NS) 0.9 % injection 10-40 mL, 10-40 mL, Intracatheter, PRN, Minor, Grace Bushy, NP     LABS:   CBC Latest Ref Rng & Units 04/19/2018 04/18/2018 04/17/2018  WBC 4.0 - 10.5 K/uL 8.5 11.7(H) 10.3  Hemoglobin 13.0 - 17.0 g/dL 12.8(L) 12.3(L) 12.9(L)  Hematocrit 39.0 - 52.0 % 39.7 40.6 41.6  Platelets 150 - 400 K/uL 324 370 354    CMP Latest Ref Rng & Units 04/19/2018 04/18/2018 04/18/2018  Glucose 70 - 99 mg/dL 306(H) 302(H) 212(H)  BUN 6 - 20 mg/dL 83(H) 72(H) 87(H)  Creatinine 0.61 - 1.24 mg/dL 9.46(H) 8.36(H) 9.02(H)  Sodium 135 - 145 mmol/L 130(L) 134(L) 136  Potassium 3.5 - 5.1 mmol/L 3.8 3.3(L) 3.5  Chloride 98 - 111 mmol/L 94(L) 97(L) 97(L)  CO2 22 - 32 mmol/L 18(L) 17(L) 20(L)  Calcium 8.9 - 10.3 mg/dL 9.3 9.2 9.5  Total Protein 6.5 - 8.1 g/dL - - -  Total Bilirubin 0.3 - 1.2 mg/dL - - -  Alkaline Phos 38 - 126 U/L - - -  AST 15 - 41 U/L - - -  ALT 0 - 44 U/L - - -    Lab Results  Component Value Date   CALCIUM 9.3 04/19/2018   CAION 1.19 04/15/2018   PHOS 5.6 (H) 04/19/2018       Component Value Date/Time   COLORURINE YELLOW 03/28/2018 1040   APPEARANCEUR CLEAR 03/28/2018 1040   LABSPEC 1.015 03/28/2018 1040   PHURINE 7.0 03/28/2018 1040   GLUCOSEU 50 (A) 03/28/2018 1040   HGBUR NEGATIVE 03/28/2018 1040   BILIRUBINUR NEGATIVE 03/28/2018 1040   KETONESUR NEGATIVE 03/28/2018 1040   PROTEINUR 100 (A) 03/28/2018 1040   UROBILINOGEN 0.2 12/18/2006 0428    NITRITE NEGATIVE 03/28/2018 1040   LEUKOCYTESUR NEGATIVE 03/28/2018 1040      Component Value Date/Time   PHART 7.330 (L) 04/15/2018 0435   PCO2ART 47.7 04/15/2018 0435   PO2ART 101.0 04/15/2018 0435   HCO3 25.1 04/15/2018 0435   TCO2 27 04/15/2018 0435   ACIDBASEDEF 1.0 04/15/2018 0435   O2SAT 97.0 04/15/2018 0435       Component Value Date/Time   IRON 56 12/18/2006 0736   TIBC 314 12/18/2006 0736   FERRITIN 65 12/18/2006 0736   IRONPCTSAT 18 (L) 12/18/2006 0736       ASSESSMENT/PLAN:      1.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 S/p CRRT- came off 2/4. Plan dialysis on Saturday Will need to trend Cr- maybe not recoverable but remains to be seen.  Small amount of urine output documented yesterday.  2. Acute hypoxic respiratory failure:With evidence of ARDS and preceding viral pneumonia/superimposed HCAP versus aspiration pneumonia- got Zosyn. extubated  3.Anemia:Hemoglobin stable.  No further darbepoetin at this time.  4. History of atrial fibrillation/atrial flutter status post DCCV and ablation.He is currently rate controlled  5. Acute exacerbation of CHF with cardiogenic shock:per primary,   6.  Hyperphosphatemia.  We will add phosphate binder.  7.  Dispo: Will need CLIP for dialysis as an outpatient if no improvement in renal function.  Will monitor urine output.    [place], DO, [place]

## 2018-04-19 NOTE — Progress Notes (Signed)
Physical Therapy Treatment Patient Details Name: Travis Bryan MRN: [id number] DOB: April 28, 1970 Today's Date: 04/19/2018    History of Present Illness 48 y.o. male admitted with pneumonia and decompensated heart failure. PEA arrest on 1/20, intubated 1/20-2/04.  PMH includes but not limited to: HTN, HLD, CKD III, A fib on coumadin, AICD placement, MI, DM2.     PT Comments    Patient with ongoing progress with therapy, increasing ambulation distance and overall dynamic balance. Still requires hands on assistance OOB and demonstrates poor safety awareness, slow processing. Would benefit from further cognitive testing. Cont to strongly rec CIR at this time.   VSS on RA      Follow Up Recommendations  CIR     Equipment Recommendations  (TBD)    Recommendations for Other Services Rehab consult     Precautions / Restrictions Precautions Precautions: Fall Precaution Comments: watch O2 Restrictions Weight Bearing Restrictions: No    Mobility  Bed Mobility Overal bed mobility: Needs Assistance Bed Mobility: Supine to Sit     Supine to sit: Min assist     General bed mobility comments: Pt at EOB with PT  Transfers Overall transfer level: Needs assistance Equipment used: Rolling walker (2 wheeled);1 person hand held assist Transfers: Sit to/from [facility] Sit to Stand: Min assist Stand pivot transfers: Min assist;+2 physical assistance       General transfer comment: min A to stand, good hand position but unsafe with RW at this time as tilts it back  Ambulation/Gait Ambulation/Gait assistance: Min guard;Min assist Gait Distance (Feet): 45 Feet Assistive device: Rolling walker (2 wheeled) Gait Pattern/deviations: Step-through pattern;Step-to pattern Gait velocity: decreased   General Gait Details: ambulating short distance into halwlay and back. VSS, improving speed, use of RW. no overt LOB but unsteadiness requiring hands on guarding.     Stairs             Wheelchair Mobility    Modified Rankin (Stroke Patients Only)       Balance Overall balance assessment: Needs assistance Sitting-balance support: No upper extremity supported;Feet supported Sitting balance-Leahy Scale: Fair     Standing balance support: During functional activity;Bilateral upper extremity supported Standing balance-Leahy Scale: Poor                              Cognition Arousal/Alertness: Awake/alert Behavior During Therapy: Flat affect Overall Cognitive Status: Impaired/Different from baseline Area of Impairment: Attention;Following commands;Memory;Safety/judgement;Awareness;Problem solving                   Current Attention Level: Sustained Memory: Decreased short-term memory Following Commands: Follows one step commands inconsistently;Follows one step commands with increased time Safety/Judgement: Decreased awareness of safety Awareness: Emergent Problem Solving: Slow processing;Decreased initiation;Requires verbal cues General Comments: cont slow processing and flat affect, follows cues and is AOx4      Exercises      General Comments        Pertinent Vitals/Pain Pain Assessment: No/denies pain    Home Living                      Prior Function            PT Goals (current goals can now be found in the care plan section) Acute Rehab PT Goals PT Goal Formulation: With patient Time For Goal Achievement: 05/01/18 Potential to Achieve Goals: Good Progress towards PT goals: Progressing toward goals  Frequency    Min 3X/week      PT Plan Current plan remains appropriate    Co-evaluation PT/OT/SLP Co-Evaluation/Treatment: Yes Reason for Co-Treatment: For patient/therapist safety PT goals addressed during session: Mobility/safety with mobility;Balance;Proper use of DME;Strengthening/ROM OT goals addressed during session: ADL's and self-care;Proper use of Adaptive  equipment and DME      AM-PAC PT "6 Clicks" Mobility   Outcome Measure  Help needed turning from your back to your side while in a flat bed without using bedrails?: A Little Help needed moving from lying on your back to sitting on the side of a flat bed without using bedrails?: A Little Help needed moving to and from a bed to a chair (including a wheelchair)?: A Little Help needed standing up from a chair using your arms (e.g., wheelchair or bedside chair)?: A Little Help needed to walk in hospital room?: A Lot Help needed climbing 3-5 steps with a railing? : Total 6 Click Score: 15    End of Session Equipment Utilized During Treatment: Gait belt Activity Tolerance: Patient tolerated treatment well Patient left: in chair;with bed alarm set Nurse Communication: Mobility status PT Visit Diagnosis: Unsteadiness on feet (R26.81)     Time: 0263-7858 PT Time Calculation (min) (ACUTE ONLY): 24 min  Charges:  $Gait Training: 8-22 mins                     Reinaldo Berber, PT, DPT Acute Rehabilitation Services Pager: [phone] Office: [place] 04/19/2018, 1:44 PM

## 2018-04-19 NOTE — Progress Notes (Signed)
ANTICOAGULATION CONSULT NOTE - Follow Up Consult  Pharmacy Consult for heparin Indication: atrial fibrillation  Labs: Recent Labs    04/17/18 0658  04/18/18 0500 04/18/18 0753 04/18/18 1650 04/18/18 1810 04/19/18 0322 04/19/18 0327  HGB 12.9*  --   --  12.3*  --   --  12.8*  --   HCT 41.6  --   --  40.6  --   --  39.7  --   PLT 354  --   --  370  --   --  324  --   LABPROT 16.9*  --   --  17.2*  --   --  15.2  --   INR 1.39  --   --  1.42  --   --  1.21  --   HEPARINUNFRC  --    < >  --  0.54  --  0.73*  --  0.63  CREATININE 5.99*   < > 9.02*  --  8.36*  --   --  9.46*   < > = values in this interval not displayed.    Assessment/Plan:  48yo male therapeutic on heparin after rate change. Will continue gtt at current rate and confirm stable with additional level.   Wynona Neat, PharmD, BCPS  04/19/2018,4:47 AM

## 2018-04-19 NOTE — Plan of Care (Signed)
  Problem: Health Behavior/Discharge Planning: Goal: Ability to manage health-related needs will improve Outcome: Not Progressing   Problem: Clinical Measurements: Goal: Ability to maintain clinical measurements within normal limits will improve Outcome: Not Progressing   Problem: Activity: Goal: Risk for activity intolerance will decrease Outcome: Progressing   Problem: Nutrition: Goal: Adequate nutrition will be maintained Outcome: Progressing   Problem: Pain Managment: Goal: General experience of comfort will improve Outcome: Progressing

## 2018-04-19 NOTE — Progress Notes (Signed)
[facility] for Warfarin/Heparin Indication: atrial fibrillation  Patient Measurements: Height: 5\' 10"  (177.8 cm) Weight: 226 lb 6.6 oz (102.7 kg) IBW/kg (Calculated) : 73  Vital Signs: Temp: 98 F (36.7 C) (02/07 0752) Temp Source: Axillary (02/07 0752) BP: 105/72 (02/07 0752) Pulse Rate: 72 (02/07 0752)  Labs: Recent Labs    04/17/18 0658  04/18/18 0500 04/18/18 0753 04/18/18 1650 04/18/18 1810 04/19/18 0322 04/19/18 0327 04/19/18 1007  HGB 12.9*  --   --  12.3*  --   --  12.8*  --   --   HCT 41.6  --   --  40.6  --   --  39.7  --   --   PLT 354  --   --  370  --   --  324  --   --   LABPROT 16.9*  --   --  17.2*  --   --  15.2  --   --   INR 1.39  --   --  1.42  --   --  1.21  --   --   HEPARINUNFRC  --    < >  --  0.54  --  0.73*  --  0.63 0.62  CREATININE 5.99*   < > 9.02*  --  8.36*  --   --  9.46*  --    < > = values in this interval not displayed.    Assessment: 48 year old male presenting with NV and fever, h/o afib on warfarin PTA.   Heparin had been held for multiple days d/t elevated INR.  s/p vit K and INR down to 1.39 yesterday 2/5 thus  IV heparin resumed on 04/17/18.   Confirmatory HL this AM is 0.62 on 1200 units/hr. No s/s of bleeding per RN.    Goal of Therapy:  Heparin level 0.3-0.7 units/ml Monitor platelets by anticoagulation protocol: Yes   Plan:  Continue IV heparin at 1200 units/hr Daily HL CBC INR  Ruari Duggan A. Levada Dy, PharmD, [place] Pager: [phone] Please utilize Amion for appropriate phone number to reach the unit pharmacist ([place])

## 2018-04-19 NOTE — Progress Notes (Signed)
Occupational Therapy Treatment Patient Details Name: Travis Bryan MRN: [id number] DOB: 02/19/1971 Today's Date: 04/19/2018    History of present illness 48 y.o. male admitted with pneumonia and decompensated heart failure. PEA arrest on 1/20, intubated 1/20-2/04.  PMH includes but not limited to: HTN, HLD, CKD III, A fib on coumadin, AICD placement, MI, DM2.    OT comments  Pt making progress with functional goals.Pt continues to demo Poor safety awareness and require physical/tatile cues for functional tasks. Pt participated in grooming/hygiene standing at sink with RW. OT will continue to follow acutely  Follow Up Recommendations  CIR;Supervision/Assistance - 24 hour    Equipment Recommendations  Other (comment)(TBD at next venue of care)    Recommendations for Other Services      Precautions / Restrictions Precautions Precautions: Fall Precaution Comments: watch O2 Restrictions Weight Bearing Restrictions: No       Mobility Bed Mobility Overal bed mobility: Needs Assistance Bed Mobility: Supine to Sit     Supine to sit: Min assist     General bed mobility comments: Pt at EOB with PT  Transfers Overall transfer level: Needs assistance Equipment used: Rolling walker (2 wheeled);1 person hand held assist Transfers: Sit to/from [facility] Sit to Stand: Min assist Stand pivot transfers: Min assist;+2 physical assistance       General transfer comment: min A to stand, good hand position but unsafe with RW at this time as tilts it back    Balance Overall balance assessment: Needs assistance Sitting-balance support: No upper extremity supported;Feet supported Sitting balance-Leahy Scale: Fair     Standing balance support: During functional activity;Bilateral upper extremity supported Standing balance-Leahy Scale: Poor                             ADL either performed or assessed with clinical judgement   ADL Overall ADL's : Needs  assistance/impaired Eating/Feeding: Supervision/ safety;Set up;Sitting Eating/Feeding Details (indicate cue type and reason): mod verbal cues to slow down, chew and swallow before taking next bite of food Grooming: Min guard;Cueing for sequencing;Cueing for safety;Wash/dry hands;Wash/dry face;Standing                   Toilet Transfer: Minimal assistance;+2 for physical assistance;Ambulation;RW;Cueing for safety   Toileting- Clothing Manipulation and Hygiene: Minimal assistance;Sit to/from stand       Functional mobility during ADLs: Minimal assistance;+2 for physical assistance;Cueing for safety;Cueing for sequencing       Vision Baseline Vision/History: Wears glasses Wears Glasses: At all times     Perception     Praxis      Cognition Arousal/Alertness: Awake/alert Behavior During Therapy: Flat affect Overall Cognitive Status: Impaired/Different from baseline Area of Impairment: Attention;Following commands;Memory;Safety/judgement;Awareness;Problem solving                   Current Attention Level: Sustained Memory: Decreased short-term memory Following Commands: Follows one step commands inconsistently;Follows one step commands with increased time Safety/Judgement: Decreased awareness of safety Awareness: Emergent Problem Solving: Slow processing;Decreased initiation;Requires verbal cues General Comments: cont slow processing and flat affect, follows cues and is AOx4        Exercises     Shoulder Instructions       General Comments      Pertinent Vitals/ Pain       Pain Assessment: No/denies pain  Home Living  Prior Functioning/Environment              Frequency  Min 3X/week        Progress Toward Goals  OT Goals(current goals can now be found in the care plan section)  Progress towards OT goals: Progressing toward goals     Plan Discharge plan remains appropriate     Co-evaluation    PT/OT/SLP Co-Evaluation/Treatment: Yes Reason for Co-Treatment: For patient/therapist safety PT goals addressed during session: Mobility/safety with mobility;Balance;Proper use of DME;Strengthening/ROM OT goals addressed during session: ADL's and self-care;Proper use of Adaptive equipment and DME      AM-PAC OT "6 Clicks" Daily Activity     Outcome Measure   Help from another person eating meals?: A Little Help from another person taking care of personal grooming?: A Little Help from another person toileting, which includes using toliet, bedpan, or urinal?: A Little Help from another person bathing (including washing, rinsing, drying)?: A Lot Help from another person to put on and taking off regular upper body clothing?: A Little Help from another person to put on and taking off regular lower body clothing?: A Lot 6 Click Score: 16    End of Session Equipment Utilized During Treatment: Gait belt;Rolling walker  OT Visit Diagnosis: Unsteadiness on feet (R26.81);Other abnormalities of gait and mobility (R26.89);Muscle weakness (generalized) (M62.81);Pain;Other symptoms and signs involving cognitive function   Activity Tolerance Patient tolerated treatment well   Patient Left in chair;with call bell/phone within reach;with chair alarm set   Nurse Communication          Time: 0762-2633 OT Time Calculation (min): 18 min  Charges: OT General Charges $OT Visit: 1 Visit OT Treatments $Self Care/Home Management : 8-22 mins     Britt Bottom 04/19/2018, 2:25 PM

## 2018-04-19 NOTE — Progress Notes (Signed)
PROGRESS NOTE  Travis Bryan HQP:[id number] DOB: 26-Jun-1970 DOA: 03/28/2018 PCP: Carroll Sage, MD  Brief History   Pt. with PMH of HTN, HLD, CKD III, A fib on coumadin, chronic NICM EF 25% AICD placement, MI, DM2; admitted on 03/28/2018, presented with complaint of fever and shortness of breath, was found to have sepsis secondary to pneumonia.  Had a respiratory failure requiring intubation followed by PEA arrest on 04/01/2018.  Renal function progressively worsened and required CVVHD currently on HD.  Advanced heart failure team was also consulted currently signed off. Left IJ HD catheter inserted on 04/13/2018. The patient is awaiting arrangement for HD chair as outpatient.   A & P   Acute Hypoxic Respiratory Failure, multifactorial with moderate ARDS: Initially required mechanical ventilation. He was extubated on 04/16/2018. He has completed zosyn. Continue aggressive pulmonary toilet.  Metapneumovirus pneumonia with superimposed bacterial pneumonia complicated by decompensated HF and renal failure. The patient is currently saturating at 99% on 1 Liter O2 by nasal cannula. He has completed a course of IV Zosyn.  Acute kidney injury and chronic kidney disease stage III: Appreciate nephrology assistance. Pt is now ESRD on HD s/p CRRT. He is awaiting an outpatient HD chair.Marland Kitchen  PEA arrest secondary to respiratory failure: The patient had subsequent cardiogenic shock. History of nonischemic cardiomyopathy status post ICD. Heart failure team was consulted, currently signed off.   Paroxysmal atrial fibrillation. He has achieved negative volume status with HD. Coumadin is being managed by pharmacy. He is being bridged on heparin. The patient's rate is currently stabilized on PO amiodarone  Cholelithiasis: Seen on CT abdomen without evidence of cholecystitis. AST, ALT, and Tbili are notmal, although alk phos was elevated. Possible cirrhosis as liver had a nodular appearance on CT. HIDA scan if further  concern.  Suspected Ileus: Resolving. Pt is currently tolerating a renal diet with fluid restriction.  Coagulopathy/Suprtherapeutic -hbg /plt stable:  He was treated with vitamin K x1 in anticipation of tracheostomy tracheostomy is not needed therefore he will resume Coumadin as previously. Continue heparin for bridging.  Atrial fibrillation/atrial flutter status post ablation: CHADS-VASc Scoreof 4. Continue amiodarone 200 mgdaily andcoumadin/heparin per pharmacy  Hypertension: Home medications includeamlodipine 5 mg QD,carvedilol 25 mg QD, hydralazine 100 mg QD, Imdur 90 mg QD, and torsemide 60 mg QD. Currently all blood pressure medication on hold due to hypotension.  Type 2 diabetes uncontrolled with hyperglycemia: Most recent A1c is 10.3 Continue sliding scale insulin. Will add lantus as FSBS remains in the high 200's.  CAD/History of MI/Cardiomyopathy with AICD: Home medications include carvedilol,warfarin,atorstatin and Ranexa. Carvedilol and ranexa currently on hold due to hypotension. Continue atorvastatin and pharmacy is dosing warfarin.  Hyperlipidemia: Continue atorvastatin 40 mg daily  Gout: Dose of allopurinol has been reduced to 100 mg daily due to the patient's ESRD.   Pressure Injury 04/16/18 Stage II -  Partial thickness loss of dermis presenting as a shallow open ulcer with a red, pink wound bed without slough. size of nickel (Active)  04/16/18 0800  Location: Buttocks  Location Orientation: Left  Staging: Stage II -  Partial thickness loss of dermis presenting as a shallow open ulcer with a red, pink wound bed without slough.  Wound Description (Comments): size of nickel  Present on Admission:      Pressure Injury 04/17/18 Stage II -  Partial thickness loss of dermis presenting as a shallow open ulcer with a red, pink wound bed without slough. (Active)  04/17/18 1710  Location: Coccyx  Location  Orientation: Medial  Staging: Stage II -  Partial  thickness loss of dermis presenting as a shallow open ulcer with a red, pink wound bed without slough.  Wound Description (Comments):   Present on Admission: Yes     Diet: renal diet DVT Prophylaxis: subcutaneous Heparin, transitioning to coumadin. Advance goals of care discussion: full code Family Communication: Emergency Contact is Travis Bryan (Relative) [phone] Disposition: Discharge to be determined.   Travis Schollmeyer, DO [facility] Direct contact: see [email or web address]  7PM-7AM contact night coverage as above 04/19/2018, 5:19 PM  LOS: 22 days   Consultants  . Nephrology, Cardiology, Pulmonary Critical Care.  Interval History/Subjective  The patient is without new complaints. No acute distress.  Objective   Vitals:  Vitals:   04/19/18 1300 04/19/18 1649  BP:  102/68  Pulse: 75 66  Resp: (!) 21 (!) 21  Temp:  98 F (36.7 C)  SpO2: 98% 92%    Exam:  Constitutional:  . The patient is awake, and alert. No acute distress. Respiratory:  . No wheezes, rales, or rhonchi.  . No increased work of breathing. Cardiovascular:  . Regular rate and rhythm. No murmurs, ectopy, or gallups. . No LE extremity edema   . Normal pedal pulses Abdomen:  . Abdomen is soft, non-tender, non-distended. . No hernias . No HSM Musculoskeletal:  . Digits/nails BUE: no clubbing, cyanosis, petechiae, infection . exam of joints, bones, muscles of at least one of following: head/neck, RUE, LUE, RLE, LLE   o strength and tone normal, no atrophy, no abnormal movements o No tenderness, masses o Normal ROM, no contractures  Skin:  . No rashes, lesions, ulcers . palpation of skin: no induration or nodules Neurologic:  . CN 2-12 intact . Sensation all 4 extremities intact  I have seen and examined this patient myself. I have spent 32 minutes in his evaluation and care.  I have personally reviewed the following:   Today's Data  . CBC, CMP  Imaging  . CT abdomen  Other Data    . Vitals, I's and O's  Scheduled Meds: . albuterol  2.5 mg Nebulization TID  . [START ON 04/20/2018] allopurinol  100 mg Per Tube Daily  . amiodarone  200 mg Per Tube Daily  . atorvastatin  40 mg Per Tube q1800  . Chlorhexidine Gluconate Cloth  6 each Topical Daily  . Chlorhexidine Gluconate Cloth  6 each Topical Q0600  . docusate  50 mg Per Tube BID  . insulin aspart  0-9 Units Subcutaneous TID WC  . mouth rinse  15 mL Mouth Rinse BID  . nystatin  5 mL Oral QID  . pantoprazole sodium  40 mg Per Tube Daily  . polyethylene glycol  17 g Per Tube Daily  . sennosides  5 mL Per Tube BID  . sevelamer carbonate  800 mg Oral TID WC  . sodium chloride flush  10-40 mL Intracatheter Q12H   Continuous Infusions: . sodium chloride 10 mL/hr at 04/17/18 0036  . sodium chloride    . sodium chloride    . heparin 1,200 Units/hr (04/19/18 4166)    Principal Problem:   Acute respiratory failure with hypoxia (HCC) Active Problems:   Hypertension associated with diabetes ([place])   Automatic implantable cardioverter-defibrillator in situ   Type 2 diabetes mellitus with complication, with long-term current use of insulin (HCC)   Acute on chronic systolic heart failure, NYHA class 4 (HCC)   Atrial fibrillation (HCC)   CAD (coronary artery disease)  Elevated troponin   AKI (acute kidney injury) ([place])   Bilateral pneumonia   Pressure injury of skin   LOS: 22 days

## 2018-04-20 DIAGNOSIS — N185 Chronic kidney disease, stage 5: Secondary | ICD-10-CM

## 2018-04-20 LAB — RENAL FUNCTION PANEL
Albumin: 2.4 g/dL — ABNORMAL LOW (ref 3.5–5.0)
Albumin: 2.3 g/dL — ABNORMAL LOW (ref 3.5–5.0)
Anion gap: 17 — ABNORMAL HIGH (ref 5–15)
Anion gap: 15 (ref 5–15)
BUN: 96 mg/dL — ABNORMAL HIGH (ref 6–20)
BUN: 51 mg/dL — ABNORMAL HIGH (ref 6–20)
CALCIUM: 8.8 mg/dL — AB (ref 8.9–10.3)
CO2: 19 mmol/L — ABNORMAL LOW (ref 22–32)
CO2: 20 mmol/L — ABNORMAL LOW (ref 22–32)
CREATININE: 8.05 mg/dL — AB (ref 0.61–1.24)
Calcium: 9.4 mg/dL (ref 8.9–10.3)
Chloride: 94 mmol/L — ABNORMAL LOW (ref 98–111)
Chloride: 97 mmol/L — ABNORMAL LOW (ref 98–111)
Creatinine, Ser: 11.87 mg/dL — ABNORMAL HIGH (ref 0.61–1.24)
GFR calc Af Amer: 5 mL/min — ABNORMAL LOW (ref >60)
GFR calc Af Amer: 8 mL/min — ABNORMAL LOW (ref >60)
GFR calc non Af Amer: 4 mL/min — ABNORMAL LOW (ref >60)
GFR calc non Af Amer: 7 mL/min — ABNORMAL LOW (ref >60)
Glucose, Bld: 337 mg/dL — ABNORMAL HIGH (ref 70–99)
Glucose, Bld: 294 mg/dL — ABNORMAL HIGH (ref 70–99)
Phosphorus: 5.7 mg/dL — ABNORMAL HIGH (ref 2.5–4.6)
Phosphorus: 3.3 mg/dL (ref 2.5–4.6)
Potassium: 3.6 mmol/L (ref 3.5–5.1)
Potassium: 3.3 mmol/L — ABNORMAL LOW (ref 3.5–5.1)
SODIUM: 132 mmol/L — AB (ref 135–145)
Sodium: 130 mmol/L — ABNORMAL LOW (ref 135–145)

## 2018-04-20 LAB — GLUCOSE, CAPILLARY
Glucose-Capillary: 266 mg/dL — ABNORMAL HIGH (ref 70–99)
Glucose-Capillary: 331 mg/dL — ABNORMAL HIGH (ref 70–99)
Glucose-Capillary: 170 mg/dL — ABNORMAL HIGH (ref 70–99)
Glucose-Capillary: 289 mg/dL — ABNORMAL HIGH (ref 70–99)
Glucose-Capillary: 293 mg/dL — ABNORMAL HIGH (ref 70–99)

## 2018-04-20 LAB — CBC
HCT: 37.8 % — ABNORMAL LOW (ref 39.0–52.0)
Hemoglobin: 11.7 g/dL — ABNORMAL LOW (ref 13.0–17.0)
MCH: 28.4 pg (ref 26.0–34.0)
MCHC: 31 g/dL (ref 30.0–36.0)
MCV: 91.7 fL (ref 80.0–100.0)
Platelets: 338 10*3/uL (ref 150–400)
RBC: 4.12 MIL/uL — ABNORMAL LOW (ref 4.22–5.81)
RDW: 15.8 % — ABNORMAL HIGH (ref 11.5–15.5)
WBC: 7.2 10*3/uL (ref 4.0–10.5)
nRBC: 0 % (ref 0.0–0.2)

## 2018-04-20 LAB — PROTIME-INR
INR: 1.17
Prothrombin Time: 14.8 seconds (ref 11.4–15.2)

## 2018-04-20 LAB — HEPARIN LEVEL (UNFRACTIONATED): HEPARIN UNFRACTIONATED: 0.48 [IU]/mL (ref 0.30–0.70)

## 2018-04-20 MED ORDER — WARFARIN - PHARMACIST DOSING INPATIENT
Freq: Every day | Status: DC
  Administered 2018-04-20 – 2018-04-21 (×2)

## 2018-04-20 MED ORDER — SODIUM CHLORIDE 0.9 % IV SOLN: 100.0000 mL | INTRAVENOUS | Status: DC | PRN

## 2018-04-20 MED ORDER — ALTEPLASE 2 MG IJ SOLR
2.0000 mg | Freq: Once | INTRAMUSCULAR | Status: DC | PRN
  Filled 2018-04-20: qty 2

## 2018-04-20 MED ORDER — WARFARIN SODIUM 7.5 MG PO TABS
7.5000 mg | ORAL_TABLET | Freq: Once | ORAL | Status: AC
  Administered 2018-04-20: 7.5 mg via ORAL
  Filled 2018-04-20 (×2): qty 1

## 2018-04-20 MED ORDER — HEPARIN SODIUM (PORCINE) 1000 UNIT/ML IJ SOLN
INTRAMUSCULAR | Status: AC
  Administered 2018-04-20: 1000 [IU]
  Filled 2018-04-20: qty 4

## 2018-04-20 NOTE — Plan of Care (Signed)
  Problem: Health Behavior/Discharge Planning: Goal: Ability to manage health-related needs will improve Outcome: Progressing   Problem: Clinical Measurements: Goal: Ability to maintain clinical measurements within normal limits will improve Outcome: Progressing Goal: Will remain free from infection Outcome: Progressing Goal: Diagnostic test results will improve Outcome: Progressing Goal: Respiratory complications will improve Outcome: Progressing Goal: Cardiovascular complication will be avoided Outcome: Progressing   Problem: Activity: Goal: Risk for activity intolerance will decrease Outcome: Progressing   Problem: Nutrition: Goal: Adequate nutrition will be maintained Outcome: Progressing   Problem: Coping: Goal: Level of anxiety will decrease Outcome: Progressing   Problem: Elimination: Goal: Will not experience complications related to bowel motility Outcome: Progressing Goal: Will not experience complications related to urinary retention Outcome: Progressing   Problem: Pain Managment: Goal: General experience of comfort will improve Outcome: Progressing   Problem: Safety: Goal: Ability to remain free from injury will improve Outcome: Progressing   Problem: Skin Integrity: Goal: Risk for impaired skin integrity will decrease Outcome: Progressing   Problem: Respiratory: Goal: Ability to maintain adequate ventilation will improve Outcome: Progressing Goal: Ability to maintain a clear airway will improve Outcome: Progressing   Problem: Activity: Goal: Ability to tolerate increased activity will improve Outcome: Progressing   Problem: Respiratory: Goal: Ability to maintain a clear airway and adequate ventilation will improve Outcome: Progressing   Problem: Role Relationship: Goal: Method of communication will improve Outcome: Progressing   Problem: Fluid Volume: Goal: Compliance with measures to maintain balanced fluid volume will improve Outcome:  Progressing   Problem: Nutritional: Goal: Ability to make healthy dietary choices will improve Outcome: Progressing

## 2018-04-20 NOTE — Progress Notes (Signed)
Physical Therapy Treatment Patient Details Name: Travis Bryan MRN: [id number] DOB: Jun 30, 1970 Today's Date: 04/20/2018    History of Present Illness 48 y.o. male admitted with pneumonia and decompensated heart failure. PEA arrest on 1/20, intubated 1/20-2/04.  PMH includes but not limited to: HTN, HLD, CKD III, A fib on coumadin, AICD placement, MI, DM2.     PT Comments    Patient seen after HD today. Ambulating slightly further distances  with RW, min A at times to stabilize and help avoid obstacles. Pt with slow processing and flat affect, slightly improving this session. Cont to rec CIR.   Follow Up Recommendations  CIR     Equipment Recommendations  (TBD)    Recommendations for Other Services Rehab consult     Precautions / Restrictions Precautions Precautions: Fall Precaution Comments: watch O2 Restrictions Weight Bearing Restrictions: No    Mobility  Bed Mobility Overal bed mobility: Needs Assistance Bed Mobility: Supine to Sit     Supine to sit: Min assist     General bed mobility comments: Pt at EOB with PT  Transfers Overall transfer level: Needs assistance Equipment used: Rolling walker (2 wheeled);1 person hand held assist Transfers: Sit to/from [facility] Sit to Stand: Min assist Stand pivot transfers: Min assist;+2 physical assistance       General transfer comment: min A to stand, good hand position but unsafe with RW at this time as tilts it back  Ambulation/Gait Ambulation/Gait assistance: Min guard;Min assist Gait Distance (Feet): 75 Feet Assistive device: Rolling walker (2 wheeled) Gait Pattern/deviations: Step-through pattern;Step-to pattern Gait velocity: decreased   General Gait Details: ambulating more distance today, less fatigue noted. still poor awarenss with walker, obstcale avoidacne. min A at times to stabilize   Stairs             Wheelchair Mobility    Modified Rankin (Stroke Patients Only)        Balance Overall balance assessment: Needs assistance Sitting-balance support: No upper extremity supported;Feet supported Sitting balance-Leahy Scale: Fair     Standing balance support: During functional activity;Bilateral upper extremity supported Standing balance-Leahy Scale: Poor                              Cognition Arousal/Alertness: Awake/alert Behavior During Therapy: Flat affect Overall Cognitive Status: Impaired/Different from baseline Area of Impairment: Attention;Following commands;Memory;Safety/judgement;Awareness;Problem solving                   Current Attention Level: Sustained Memory: Decreased short-term memory Following Commands: Follows one step commands inconsistently;Follows one step commands with increased time Safety/Judgement: Decreased awareness of safety Awareness: Emergent Problem Solving: Slow processing;Decreased initiation;Requires verbal cues General Comments: mild progression today, cont slow processing and flat affect, follows cues and is AOx4      Exercises      General Comments        Pertinent Vitals/Pain Pain Assessment: No/denies pain    Home Living                      Prior Function            PT Goals (current goals can now be found in the care plan section) Acute Rehab PT Goals Patient Stated Goal: non stated PT Goal Formulation: With patient Time For Goal Achievement: 05/01/18 Potential to Achieve Goals: Good Progress towards PT goals: Progressing toward goals    Frequency    Min 3X/week  PT Plan Current plan remains appropriate    Co-evaluation              AM-PAC PT "6 Clicks" Mobility   Outcome Measure  Help needed turning from your back to your side while in a flat bed without using bedrails?: A Little Help needed moving from lying on your back to sitting on the side of a flat bed without using bedrails?: A Little Help needed moving to and from a bed to a  chair (including a wheelchair)?: A Little Help needed standing up from a chair using your arms (e.g., wheelchair or bedside chair)?: A Little Help needed to walk in hospital room?: A Lot Help needed climbing 3-5 steps with a railing? : Total 6 Click Score: 15    End of Session Equipment Utilized During Treatment: Gait belt Activity Tolerance: Patient tolerated treatment well Patient left: in bed;with bed alarm set Nurse Communication: Mobility status PT Visit Diagnosis: Unsteadiness on feet (R26.81)     Time: 1425-1450 PT Time Calculation (min) (ACUTE ONLY): 25 min  Charges:  $Gait Training: 8-22 mins $Therapeutic Activity: 8-22 mins                    Reinaldo Berber, PT, DPT Acute Rehabilitation Services Pager: [phone] Office: [place] 04/20/2018, 2:50 PM

## 2018-04-20 NOTE — Progress Notes (Signed)
PT Cancellation Note  Patient Details Name: Travis Bryan MRN: [id number] DOB: May 24, 1970   Cancelled Treatment:    Reason Eval/Treat Not Completed: (P) Patient at procedure or test/unavailable(off unit for dialysis will f/u per POC. )   Jaemarie Hochberg Eli Hose 04/20/2018, 12:21 PM Governor Rooks, PTA Acute Rehabilitation Services Pager [phone] Office [phone]

## 2018-04-20 NOTE — Progress Notes (Signed)
PROGRESS NOTE  Travis Bryan WUJ:[id number] DOB: 02/24/1971 DOA: 03/28/2018 PCP: Carroll Sage, MD  Brief History   Pt. with PMH of HTN, HLD, CKD III, A fib on coumadin, chronic NICM EF 25% AICD placement, MI, DM2; admitted on 03/28/2018, presented with complaint of fever and shortness of breath, was found to have sepsis secondary to pneumonia.  Had a respiratory failure requiring intubation followed by PEA arrest on 04/01/2018.  Renal function progressively worsened and required CVVHD currently on HD.  Advanced heart failure team was also consulted currently signed off.  Left IJ HD catheter inserted on 04/13/2018. The patient is awaiting arrangement for HD chair as outpatient. He underwent HD today.  A & P   Acute Hypoxic Respiratory Failure, multifactorial with moderate ARDS: Initially required mechanical ventilation. He was extubated on 04/16/2018. He has completed zosyn. Continue aggressive pulmonary toilet.  Metapneumovirus pneumonia with superimposed bacterial pneumonia complicated by decompensated HF and renal failure. The patient is currently saturating at 99% on 1 Liter O2 by nasal cannula. He has completed a course of IV Zosyn.  Acute kidney injury and chronic kidney disease stage III: Appreciate nephrology assistance. Pt is now ESRD on HD s/p CRRT. He is awaiting an outpatient HD chair.  PEA arrest secondary to respiratory failure: The patient had subsequent cardiogenic shock. History of nonischemic cardiomyopathy status post ICD. Heart failure team was consulted, currently signed off.  Paroxysmal atrial fibrillation. He has achieved negative volume status with HD. Coumadin is being managed by pharmacy. He is being bridged on heparin. The patient's rate is currently stabilized on PO amiodarone  Cholelithiasis: Seen on CT abdomen without evidence of cholecystitis. AST, ALT, and Tbili are notmal, although alk phos was elevated. Possible cirrhosis as liver had a nodular appearance on CT.  HIDA scan if further concern.  Suspected Ileus: Resolving. Pt is currently tolerating a renal diet with fluid restriction.  Coagulopathy/Suprtherapeutic -hbg /plt stable:  He was treated with vitamin K x1 in anticipation of tracheostomy tracheostomy is not needed therefore he will resume Coumadin as previously. Continue heparin for bridging.  Atrial fibrillation/atrial flutter status post ablation: CHADS-VASc Scoreof 4. Continue amiodarone 200 mgdaily andcoumadin/heparin per pharmacy  Hypertension: Home medications includeamlodipine 5 mg QD,carvedilol 25 mg QD, hydralazine 100 mg QD, Imdur 90 mg QD, and torsemide 60 mg QD. Currently all blood pressure medication on hold due to hypotension.  Type 2 diabetes uncontrolled with hyperglycemia: Most recent A1c is 10.3 Continue sliding scale insulin. Will add lantus as FSBS remains in the high 200's.  CAD/History of MI/Cardiomyopathy with AICD: Home medications include carvedilol,warfarin,atorstatin and Ranexa. Carvedilol and ranexa currently on hold due to hypotension. Continue atorvastatin and pharmacy is dosing warfarin.  Hyperlipidemia: Continue atorvastatin 40 mg daily  Gout: Dose of allopurinol has been reduced to 100 mg daily due to the patient's ESRD.   Pressure Injury 04/16/18 Stage II -  Partial thickness loss of dermis presenting as a shallow open ulcer with a red, pink wound bed without slough. size of nickel (Active)  04/16/18 0800  Location: Buttocks  Location Orientation: Left  Staging: Stage II -  Partial thickness loss of dermis presenting as a shallow open ulcer with a red, pink wound bed without slough.  Wound Description (Comments): size of nickel  Present on Admission:      Pressure Injury 04/17/18 Stage II -  Partial thickness loss of dermis presenting as a shallow open ulcer with a red, pink wound bed without slough. (Active)  04/17/18 1710  Location:  Coccyx  Location Orientation: Medial  Staging:  Stage II -  Partial thickness loss of dermis presenting as a shallow open ulcer with a red, pink wound bed without slough.  Wound Description (Comments):   Present on Admission: Yes     Diet: renal diet DVT Prophylaxis: subcutaneous Heparin, transitioning to coumadin. Advance goals of care discussion: full code Family Communication: Emergency Contact is Roxanne Gates (Relative) [phone] Disposition: Discharge to be determined.   Yumna Ebers, DO [facility] Direct contact: see [email or web address]  7PM-7AM contact night coverage as above 04/20/2018, 4:10 PM  LOS: 23 days   Consultants  . Nephrology, Cardiology, Pulmonary Critical Care.  Interval History/Subjective  The patient is without new complaints. No acute distress.  Objective   Vitals:  Vitals:   04/20/18 1115 04/20/18 1133  BP: (!) 93/52 126/75  Pulse: 90 90  Resp:  (!) 26  Temp:  98.9 F (37.2 C)  SpO2:  96%    Exam:  Constitutional:  . The patient is awake, and alert. No acute distress. Respiratory:  . No wheezes, rales, or rhonchi.  . No increased work of breathing. Cardiovascular:  . Regular rate and rhythm. No murmurs, ectopy, or gallups. . No LE extremity edema   . Normal pedal pulses Abdomen:  . Abdomen is soft, non-tender, non-distended. . No hernias . No HSM Musculoskeletal:  . Digits/nails BUE: no clubbing, cyanosis, petechiae, infection . exam of joints, bones, muscles of at least one of following: head/neck, RUE, LUE, RLE, LLE   o strength and tone normal, no atrophy, no abnormal movements o No tenderness, masses o Normal ROM, no contractures  Skin:  . No rashes, lesions, ulcers . palpation of skin: no induration or nodules Neurologic:  . CN 2-12 intact . Sensation all 4 extremities intact  I have seen and examined this patient myself. I have spent 34 minutes in his evaluation and care.  I have personally reviewed the following:   Today's Data  . CBC, CMP  Imaging  . CT  abdomen  Other Data  . Vitals, I's and O's  Scheduled Meds: . albuterol  2.5 mg Nebulization TID  . allopurinol  100 mg Per Tube Daily  . amiodarone  200 mg Per Tube Daily  . atorvastatin  40 mg Per Tube q1800  . Chlorhexidine Gluconate Cloth  6 each Topical Daily  . Chlorhexidine Gluconate Cloth  6 each Topical Q0600  . docusate  50 mg Per Tube BID  . insulin aspart  0-9 Units Subcutaneous TID WC  . insulin glargine  12 Units Subcutaneous QHS  . mouth rinse  15 mL Mouth Rinse BID  . nystatin  5 mL Oral QID  . pantoprazole sodium  40 mg Per Tube Daily  . polyethylene glycol  17 g Per Tube Daily  . sennosides  5 mL Per Tube BID  . sevelamer carbonate  800 mg Oral TID WC  . sodium chloride flush  10-40 mL Intracatheter Q12H  . warfarin  7.5 mg Oral ONCE-1800  . Warfarin - Pharmacist Dosing Inpatient   Does not apply q1800   Continuous Infusions: . sodium chloride 10 mL/hr at 04/17/18 0036  . sodium chloride    . sodium chloride    . heparin 1,200 Units/hr (04/20/18 0314)    Principal Problem:   Acute respiratory failure with hypoxia (HCC) Active Problems:   Hypertension associated with diabetes ([place])   Automatic implantable cardioverter-defibrillator in situ   Type 2 diabetes mellitus with complication, with long-term  current use of insulin (HCC)   Acute on chronic systolic heart failure, NYHA class 4 (HCC)   Atrial fibrillation (HCC)   CAD (coronary artery disease)   Elevated troponin   AKI (acute kidney injury) ([place])   Bilateral pneumonia   Pressure injury of skin   LOS: 23 days

## 2018-04-20 NOTE — Progress Notes (Signed)
ANTICOAGULATION CONSULT NOTE   Pharmacy Consult for heparin Indication: atrial fibrillation  Patient Measurements: Height: 5\' 10"  (177.8 cm) Weight: 219 lb 2.2 oz (99.4 kg) IBW/kg (Calculated) : 73  Vital Signs: Temp: 98.4 F (36.9 C) (02/08 0725) Temp Source: Oral (02/08 0725) BP: 129/75 (02/08 0725) Pulse Rate: 72 (02/08 0725)  Labs: Recent Labs    04/18/18 0753  04/19/18 0322 04/19/18 0327 04/19/18 1007 04/19/18 1601 04/20/18 0304  HGB 12.3*  --  12.8*  --   --   --  11.7*  HCT 40.6  --  39.7  --   --   --  37.8*  PLT 370  --  324  --   --   --  338  LABPROT 17.2*  --  15.2  --   --   --  14.8  INR 1.42  --  1.21  --   --   --  1.17  HEPARINUNFRC 0.54   < >  --  0.63 0.62  --  0.48  CREATININE  --    < >  --  9.46*  --  10.97* 11.87*   < > = values in this interval not displayed.    Assessment: 48 year old male presenting with NV and fever, h/o afib on warfarin PTA.   Heparin had been held for multiple days d/t elevated INR.  IV heparin resumed on 04/17/18 p/p vit K and INR down to 1.39   Heparin level this AM is 0.48 on 1200 units/hr. CBC stable  Goal of Therapy:  Heparin level 0.3-0.7 units/ml Monitor platelets by anticoagulation protocol: Yes   Plan:  Continue IV heparin at 1200 units/hr Daily heparin level and CBC Follow up resuming warfarin  Vertis Kelch, PharmD PGY1 Pharmacy Resident Phone [phone] 04/20/2018       8:26 AM

## 2018-04-20 NOTE — Progress Notes (Signed)
[facility]    NEPHROLOGY PROGRESS NOTE  SUBJECTIVE: Seen on dialysis this morning.  Denies chest pain, shortness of breath, nausea, vomiting, diarrhea or dysuria.  Does complain of some fatigue.  All other review of systems are negative.     OBJECTIVE:  Vitals:   04/20/18 1115 04/20/18 1133  BP: (!) 93/52 126/75  Pulse: 90 90  Resp:  (!) 26  Temp:  98.9 F (37.2 C)  SpO2:  96%    Intake/Output Summary (Last 24 hours) at 04/20/2018 1334 Last data filed at 04/20/2018 1133 Gross per 24 hour  Intake 300 ml  Output 800 ml  Net -500 ml      Genearl:  AAOx3 NAD HEENT: MMM [place] AT anicteric sclera Neck:  No JVD, no adenopathy left IJ temporary dialysis catheter CV:  Heart RRR  Lungs:  L/S CTA bilaterally Abd:  abd SNT/ND with normal BS GU:  Bladder non-palpable Extremities:  No LE edema. Skin:  No skin rash  MEDICATIONS:  . albuterol  2.5 mg Nebulization TID  . allopurinol  100 mg Per Tube Daily  . amiodarone  200 mg Per Tube Daily  . atorvastatin  40 mg Per Tube q1800  . Chlorhexidine Gluconate Cloth  6 each Topical Daily  . Chlorhexidine Gluconate Cloth  6 each Topical Q0600  . docusate  50 mg Per Tube BID  . insulin aspart  0-9 Units Subcutaneous TID WC  . insulin glargine  12 Units Subcutaneous QHS  . mouth rinse  15 mL Mouth Rinse BID  . nystatin  5 mL Oral QID  . pantoprazole sodium  40 mg Per Tube Daily  . polyethylene glycol  17 g Per Tube Daily  . sennosides  5 mL Per Tube BID  . sevelamer carbonate  800 mg Oral TID WC  . sodium chloride flush  10-40 mL Intracatheter Q12H  . warfarin  7.5 mg Oral ONCE-1800  . Warfarin - Pharmacist Dosing Inpatient   Does not apply q1800       LABS:   CBC Latest Ref Rng & Units 04/20/2018 04/19/2018 04/18/2018  WBC 4.0 - 10.5 K/uL 7.2 8.5 11.7(H)  Hemoglobin 13.0 - 17.0 g/dL 11.7(L) 12.8(L) 12.3(L)  Hematocrit 39.0 - 52.0 % 37.8(L) 39.7 40.6  Platelets 150 - 400 K/uL 338 324 370    CMP Latest Ref Rng &  Units 04/20/2018 04/19/2018 04/19/2018  Glucose 70 - 99 mg/dL 337(H) 296(H) 306(H)  BUN 6 - 20 mg/dL 96(H) 90(H) 83(H)  Creatinine 0.61 - 1.24 mg/dL 11.87(H) 10.97(H) 9.46(H)  Sodium 135 - 145 mmol/L 130(L) 131(L) 130(L)  Potassium 3.5 - 5.1 mmol/L 3.6 3.7 3.8  Chloride 98 - 111 mmol/L 94(L) 92(L) 94(L)  CO2 22 - 32 mmol/L 19(L) 18(L) 18(L)  Calcium 8.9 - 10.3 mg/dL 9.4 9.5 9.3  Total Protein 6.5 - 8.1 g/dL - - -  Total Bilirubin 0.3 - 1.2 mg/dL - - -  Alkaline Phos 38 - 126 U/L - - -  AST 15 - 41 U/L - - -  ALT 0 - 44 U/L - - -    Lab Results  Component Value Date   CALCIUM 9.4 04/20/2018   CAION 1.19 04/15/2018   PHOS 5.7 (H) 04/20/2018       Component Value Date/Time   COLORURINE YELLOW 03/28/2018 1040   APPEARANCEUR CLEAR 03/28/2018 1040   LABSPEC 1.015 03/28/2018 1040   PHURINE 7.0 03/28/2018 1040   GLUCOSEU 50 (A) 03/28/2018 1040   HGBUR NEGATIVE 03/28/2018  [place] 03/28/2018 [place] 03/28/2018 1040   PROTEINUR 100 (A) 03/28/2018 1040   UROBILINOGEN 0.2 12/18/2006 0428   NITRITE NEGATIVE 03/28/2018 1040   LEUKOCYTESUR NEGATIVE 03/28/2018 1040      Component Value Date/Time   PHART 7.330 (L) 04/15/2018 0435   PCO2ART 47.7 04/15/2018 0435   PO2ART 101.0 04/15/2018 0435   HCO3 25.1 04/15/2018 0435   TCO2 27 04/15/2018 0435   ACIDBASEDEF 1.0 04/15/2018 0435   O2SAT 97.0 04/15/2018 0435       Component Value Date/Time   IRON 56 12/18/2006 0736   TIBC 314 12/18/2006 0736   FERRITIN 65 12/18/2006 0736   IRONPCTSAT 18 (L) 12/18/2006 0736       ASSESSMENT/PLAN:     1.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 S/p CRRT- came off 2/4.Plan dialysis on SaturdayWill need to trend Cr- maybe not recoverable but remains to be seen.    Minimal urine output noted.  We will plan for permacath and  fistula.  2. Acute hypoxic respiratory failure:With evidence of ARDS and preceding viral pneumonia/superimposed HCAP versus aspiration pneumonia-gotZosyn.extubated  3.Anemia:Hemoglobin stable.  No further darbepoetin at this time.  4. History of atrial fibrillation/atrial flutter status post DCCV and ablation.He is currently rate controlled  5. Acute exacerbation of CHF with cardiogenic shock:per primary,   6.  Hyperphosphatemia.  We will continue phosphate binder.  7. Dispo: Will need CLIP for dialysis as an outpatient if no improvement in renal function.  Will monitor urine output.     [place], DO, [place]

## 2018-04-20 NOTE — Consult Note (Signed)
Hospital Consult    Reason for Consult:  AVF access and temporary dialysis catheter exchange Referring Physician:  Dr. Maryjane Hurter MRN #:  [id number]  History of Present Illness: This is a 48 y.o. male with history of hypertension, hyperlipidemia, acute on chronic kidney disease now progressing to end-stage renal failure, A. fib on Coumadin, chronic heart failure with an EF of 25% with a left sided chest wall AICD, MI, and diabetes that was admitted with sepsis secondary to pneumonia on 03/28/2018.  In reviewing the chart he apparently had a PEA arrest on 04/01/2018.  His renal function has worsened and now requiring hemodialysis.  On evaluation today he has a temporary left IJ catheter in place and is getting dialysis.  Patient denies any previous upper extremity access.  States he is right-handed.  States he does not work.  Past Medical History:  Diagnosis Date  . AICD (automatic cardioverter/defibrillator) present   . Atrial flutter ([place])    a. s/p ablation 03/2016  . Chronic systolic CHF (congestive heart failure) ([place])   . HCAP (healthcare-associated pneumonia) 03/28/2018  . History of gout   . Hyperlipidemia   . Hypertension   . Myocardial infarction [facility])    "I think I had one a long long time ago" (03/28/2016)  . NICM (nonischemic cardiomyopathy) ([place])    a. LHC 6/06: pLAD 20, pLCx 20-30; b. Echo 5/15:  EF 15%, diffuse HK, restrictive physiology, trivial AI, trivial MR, mild LAE, moderate RVE, moderately reduced RVSF, moderate RAE, mild to moderate TR, PASP 43 mmHg  . Obesity   . Persistent atrial fibrillation   . Type II diabetes mellitus ([place])     Past Surgical History:  Procedure Laterality Date  . CARDIOVERSION N/A 04/07/2016   Procedure: CARDIOVERSION;  Surgeon: Thayer Headings, MD;  Location: [facility] ENDOSCOPY;  Service: Cardiovascular;  Laterality: N/A;  . CARDIOVERSION N/A 04/11/2016   Procedure: CARDIOVERSION;  Surgeon: Larey Dresser, MD;  Location: [place];  Service:  Cardiovascular;  Laterality: N/A;  . ELECTROPHYSIOLOGIC STUDY N/A 03/31/2016   Procedure: A-Flutter Ablation;  Surgeon: Evans Lance, MD;  Location: [place] CV LAB;  Service: Cardiovascular;  Laterality: N/A;  . IMPLANTABLE CARDIOVERTER DEFIBRILLATOR (ICD) GENERATOR CHANGE N/A 08/27/2013   Procedure: ICD GENERATOR CHANGE;  Surgeon: Evans Lance, MD;  Location[facility] CATH LAB;  Service: Cardiovascular;  Laterality: N/A;  . TEE WITHOUT CARDIOVERSION N/A 03/31/2016   Procedure: TRANSESOPHAGEAL ECHOCARDIOGRAM (TEE);  Surgeon: Larey Dresser, MD;  Location: [place];  Service: Cardiovascular;  Laterality: N/A;    Allergies  Allergen Reactions  . No Known Allergies     Prior to Admission medications   Medication Sig Start Date End Date Taking? Authorizing Provider  amiodarone (PACERONE) 200 MG tablet Take 1 tablet (200 mg total) by mouth daily. 12/26/17  Yes Carroll Sage, MD  amLODipine (NORVASC) 5 MG tablet Take 1 tablet (5 mg total) by mouth daily. 08/23/17  Yes Lenore Cordia, MD  carvedilol (COREG) 25 MG tablet TAKE 1 TABLET BY MOUTH TWICE A DAY WITH A MEAL. Patient taking differently: Take 25 mg by mouth 2 (two) times daily with a meal.  12/14/17  Yes Larey Dresser, MD  colchicine 0.6 MG tablet Take 1 tablet (0.6 mg total) by mouth daily. 02/28/18  Yes Thurnell Lose, MD  hydrALAZINE (APRESOLINE) 100 MG tablet TAKE 1 TABLET BY MOUTH 3 TIMES DAILY. Patient taking differently: Take 100 mg by mouth 3 (three) times daily.  11/09/17  Yes  Larey Dresser, MD  insulin aspart (NOVOLOG) 100 UNIT/ML injection Substitute to any brand approved.Before each meal 3 times a day, 140-199 - 2 units, 200-250 - 4 units, 251-299 - 6 units,  300-349 - 8 units,  350 or above 10 units. Dispense syringes and needles as needed, Ok to switch to PEN if approved. DX DM2, Code E11.65 Patient taking differently: Inject 10 Units into the skin See admin instructions. Substitute to any brand approved.Before each  meal 3 times a day, 140-199 - 2 units, 200-250 - 4 units, 251-299 - 6 units,  300-349 - 8 units,  350 or above 10 units. Dispense syringes and needles as needed, Ok to switch to PEN if approved. DX DM2, Code E11.65 02/28/18  Yes Thurnell Lose, MD  insulin glargine (LANTUS) 100 UNIT/ML injection Inject 0.25 mLs (25 Units total) into the skin 2 (two) times daily. Dispense insulin pen if approved, if not dispense as needed syringes and needles for 1 month supply. Can switch to Levemir. Diagnosis E 11.65. Patient taking differently: Inject 25 Units into the skin at bedtime.  02/28/18  Yes Thurnell Lose, MD  potassium chloride SA (K-DUR,KLOR-CON) 20 MEQ tablet TAKE 2 TABLETS BY MOUTH IN THE MORNING AND 1 TABLET IN THE EVENING Patient taking differently: Take 40 mEq by mouth See admin instructions. TAKE 40 mg  IN THE MORNING AND 20 mg TABLET IN THE EVENING 01/16/18  Yes Larey Dresser, MD  ranolazine (RANEXA) 500 MG 12 hr tablet TAKE 1 TABLET (500 MG TOTAL) BY MOUTH 2 (TWO) TIMES DAILY. 12/19/17  Yes Larey Dresser, MD  torsemide (DEMADEX) 20 MG tablet Take 3 tablets (60 mg total) by mouth daily. 12/26/17  Yes Carroll Sage, MD  warfarin (COUMADIN) 5 MG tablet Take 1 tablet (5 mg total) by mouth daily at 6 PM. Take As Directed by [facility] Patient taking differently: Take 5-7.5 mg by mouth daily at 6 PM. Take 7.5 mg on Mon. Wed. Fri Take 5 mg tues, thurs, sat. sunday 03/01/18  Yes Thurnell Lose, MD  allopurinol (ZYLOPRIM) 300 MG tablet Take 300 mg by mouth daily.     [provider]  amoxicillin-clavulanate (AUGMENTIN) 875-125 MG tablet Take 1 tablet by mouth every 12 (twelve) hours. Patient not taking: Reported on 03/28/2018 02/28/18   Thurnell Lose, MD  atorvastatin (LIPITOR) 40 MG tablet Take 1 tablet (40 mg total) by mouth daily. Patient not taking: Reported on 03/28/2018 08/23/17   Lenore Cordia, MD  blood glucose meter kit and supplies KIT Dispense based on patient  and insurance preference. Use up to four times daily as directed. (FOR ICD-9 250.00, 250.01). For QAC - HS accuchecks. 02/28/18   Thurnell Lose, MD  calcitRIOL (ROCALTROL) 0.25 MCG capsule Take 0.25 mcg by mouth daily.    [provider]  diclofenac sodium (VOLTAREN) 1 % GEL Apply 2 g topically 4 (four) times daily. 02/28/18   Thurnell Lose, MD  Insulin Syringe-Needle U-100 25G X 1" 1 ML MISC For 4 times a day insulin SQ, 1 month supply. Diagnosis E11.65 02/28/18   Thurnell Lose, MD  Insulin Syringe-Needle U-100 31G X 15/64" 0.3 ML MISC Use to inject insulin 4 times a day 07/27/16   Sid Falcon, MD  isosorbide mononitrate (IMDUR) 30 MG 24 hr tablet TAKE 3 TABLETS (90 MG TOTAL) BY MOUTH DAILY. 09/07/17 02/25/18  Larey Dresser, MD  magnesium oxide (MAG-OX) 400 (241.3 Mg) MG tablet Take  1 tablet (400 mg total) by mouth daily. 04/15/16   Shirley Friar, PA-C  metolazone (ZAROXOLYN) 2.5 MG tablet TAKE 1 TABLET BY MOUTH AS NEEDED AS DIRECTED Patient taking differently: Take 2.5 mg by mouth as needed (fluid).  12/15/16   Bensimhon, Shaune Pascal, MD    Social History   Socioeconomic History  . Marital status: Widowed    Spouse name: Not on file  . Number of children: Not on file  . Years of education: Not on file  . Highest education level: Not on file  Occupational History  . Not on file  Social Needs  . Financial resource strain: Not on file  . Food insecurity:    Worry: Not on file    Inability: Not on file  . Transportation needs:    Medical: Not on file    Non-medical: Not on file  Tobacco Use  . Smoking status: Former Smoker    Years: 10.00    Types: Cigars    Last attempt to quit: 07/28/2002    Years since quitting: 15.7  . Smokeless tobacco: Never Used  Substance and Sexual Activity  . Alcohol use: No    Comment: 03/28/2016 "stopped drinking 4-5 months ago"  . Drug use: No  . Sexual activity: Not Currently  Lifestyle  . Physical activity:    Days  per week: Not on file    Minutes per session: Not on file  . Stress: Not on file  Relationships  . Social connections:    Talks on phone: Not on file    Gets together: Not on file    Attends religious service: Not on file    Active member of club or organization: Not on file    Attends meetings of clubs or organizations: Not on file    Relationship status: Not on file  . Intimate partner violence:    Fear of current or ex partner: Not on file    Emotionally abused: Not on file    Physically abused: Not on file    Forced sexual activity: Not on file  Other Topics Concern  . Not on file  Social History Narrative  . Not on file     Family History  Problem Relation Age of Onset  . Hypertension Mother   . Heart disease Mother   . Diabetes Mother     ROS: '[x]'  Positive   '[ ]'  Negative   '[ ]'  All sytems reviewed and are negative  Cardiovascular: '[]'  chest pain/pressure '[]'  palpitations '[]'  SOB lying flat '[x]'  DOE '[]'  pain in legs while walking '[]'  pain in legs at rest '[]'  pain in legs at night '[]'  non-healing ulcers '[]'  hx of DVT '[]'  swelling in legs  Pulmonary: '[]'  productive cough '[]'  asthma/wheezing '[]'  home O2  Neurologic: '[]'  weakness in '[]'  arms '[]'  legs '[]'  numbness in '[]'  arms '[]'  legs '[]'  hx of CVA '[]'  mini stroke '[]' difficulty speaking or slurred speech '[]'  temporary loss of vision in one eye '[]'  dizziness  Hematologic: '[]'  hx of cancer '[]'  bleeding problems '[]'  problems with blood clotting easily  Endocrine:   '[]'  diabetes '[]'  thyroid disease  GI '[]'  vomiting blood '[]'  blood in stool  GU: '[]'  CKD/renal failure '[]'  HD--'[]'  M/W/F or '[]'  T/T/S '[]'  burning with urination '[]'  blood in urine  Psychiatric: '[]'  anxiety '[]'  depression  Musculoskeletal: '[]'  arthritis '[]'  joint pain  Integumentary: '[]'  rashes '[]'  ulcers  Constitutional: '[]'  fever '[]'  chills   Physical Examination  Vitals:  04/20/18 1115 04/20/18 1133  BP: (!) 93/52 126/75  Pulse: 90 90  Resp:  (!) 26  Temp:   98.9 F (37.2 C)  SpO2:  96%   Body mass index is 31.16 kg/m.  General:  Sitting on edge of bed Gait: Not observed HENT: LIJ temporary catheter in place Pulmonary: appears to have increased WOB at baseline Cardiac: irregulary; left sided chest wall ICD Abdomen:soft, NT/ND, no masses Skin: without rashes Vascular Exam/Pulses: 2+ radial and brachial pulses palpable BUE Extremities: without ischemic changes, without Gangrene , without cellulitis; without open wounds;  Musculoskeletal: no muscle wasting or atrophy  Neurologic: A&O X 3; Appropriate Affect ; SENSATION: normal; MOTOR FUNCTION:  moving all extremities equally. Speech is fluent/normal   CBC    Component Value Date/Time   WBC 7.2 04/20/2018 0304   RBC 4.12 (L) 04/20/2018 0304   HGB 11.7 (L) 04/20/2018 0304   HCT 37.8 (L) 04/20/2018 0304   PLT 338 04/20/2018 0304   MCV 91.7 04/20/2018 0304   MCH 28.4 04/20/2018 0304   MCHC 31.0 04/20/2018 0304   RDW 15.8 (H) 04/20/2018 0304   LYMPHSABS 0.5 (L) 03/28/2018 1040   MONOABS 0.5 03/28/2018 1040   EOSABS 0.0 03/28/2018 1040   BASOSABS 0.0 03/28/2018 1040    BMET    Component Value Date/Time   NA 130 (L) 04/20/2018 0304   NA 141 04/20/2016 1409   K 3.6 04/20/2018 0304   CL 94 (L) 04/20/2018 0304   CO2 19 (L) 04/20/2018 0304   GLUCOSE 337 (H) 04/20/2018 0304   BUN 96 (H) 04/20/2018 0304   BUN 43 (H) 04/20/2016 1409   CREATININE 11.87 (H) 04/20/2018 0304   CREATININE 1.46 (H) 03/16/2015 1431   CALCIUM 9.4 04/20/2018 0304   GFRNONAA 4 (L) 04/20/2018 0304   GFRNONAA 56 (L) 07/28/2010 1633   GFRAA 5 (L) 04/20/2018 0304   GFRAA >60 07/28/2010 1633    COAGS: Lab Results  Component Value Date   INR 1.17 04/20/2018   INR 1.21 04/19/2018   INR 1.42 04/18/2018     Non-Invasive Vascular Imaging:      ASSESSMENT/PLAN: This is a 48 y.o. male with multiple medical problems admitted with respiratory failure secondary to pneumonia and subsequent PEA arrest now  with acute kidney injury on chronic kidney disease progressing to hemodialysis that vascular surgery has been consulted for temporary left IJ catheter exchange and permanent AV fistula access.  He needs vein mapping/upper extremity arterial duplex that I will order.  He needs to be bridged off his Coumadin.  Once we have more information we can make a decision this week about timing and exact plan of care.  Marty Heck, MD [facility] Office: [phone] Pager: [phone]

## 2018-04-20 NOTE — Progress Notes (Signed)
ANTICOAGULATION CONSULT NOTE   Pharmacy Consult for heparin Indication: atrial fibrillation  Patient Measurements: Height: 5\' 10"  (177.8 cm) Weight: 219 lb 2.2 oz (99.4 kg) IBW/kg (Calculated) : 73  Vital Signs: Temp: 98.9 F (37.2 C) (02/08 0733) Temp Source: Oral (02/08 0733) BP: 110/73 (02/08 0745) Pulse Rate: 72 (02/08 0745)  Labs: Recent Labs    04/18/18 0753  04/19/18 0322 04/19/18 0327 04/19/18 1007 04/19/18 1601 04/20/18 0304  HGB 12.3*  --  12.8*  --   --   --  11.7*  HCT 40.6  --  39.7  --   --   --  37.8*  PLT 370  --  324  --   --   --  338  LABPROT 17.2*  --  15.2  --   --   --  14.8  INR 1.42  --  1.21  --   --   --  1.17  HEPARINUNFRC 0.54   < >  --  0.63 0.62  --  0.48  CREATININE  --    < >  --  9.46*  --  10.97* 11.87*   < > = values in this interval not displayed.    Assessment: 48 year old male presenting with NV and fever, h/o afib on warfarin PTA.   Heparin had been held for multiple days d/t elevated INR.  IV heparin resumed on 04/17/18 p/p vit K and INR down to 1.39   Heparin level this AM is 0.48 on 1200 units/hr. CBC stable  Goal of Therapy:  Heparin level 0.3-0.7 units/ml Monitor platelets by anticoagulation protocol: Yes   Plan:  Continue IV heparin at 1200 units/hr Daily heparin level and CBC Follow up resuming warfarin  Vertis Kelch, PharmD PGY1 Pharmacy Resident Phone [phone] 04/20/2018       8:46 AM  --------------------------------------------------------------------------------------------------------------  Addendum: MD okay with resuming warfarin today. Warfarin pta for hx afib. PTA dose 7.5mg  MWF, 5mg  all other days   Goal of Therapy: INR 2-3 Monitor platelets by anticoagulation protocol: Yes  Plan: Warfarin 7.5 mg x1 today INR daily  Vertis Kelch, PharmD PGY1 Pharmacy Resident Phone [phone] 04/20/2018       8:49 AM

## 2018-04-21 ENCOUNTER — Inpatient Hospital Stay (HOSPITAL_COMMUNITY): Payer: Medicaid Other

## 2018-04-21 DIAGNOSIS — Z01810 Encounter for preprocedural cardiovascular examination: Secondary | ICD-10-CM

## 2018-04-21 LAB — RENAL FUNCTION PANEL
Albumin: 2.3 g/dL — ABNORMAL LOW (ref 3.5–5.0)
Albumin: 2.4 g/dL — ABNORMAL LOW (ref 3.5–5.0)
Anion gap: 18 — ABNORMAL HIGH (ref 5–15)
Anion gap: 18 — ABNORMAL HIGH (ref 5–15)
BUN: 60 mg/dL — ABNORMAL HIGH (ref 6–20)
BUN: 71 mg/dL — ABNORMAL HIGH (ref 6–20)
CALCIUM: 9.2 mg/dL (ref 8.9–10.3)
CO2: 20 mmol/L — ABNORMAL LOW (ref 22–32)
CO2: 17 mmol/L — AB (ref 22–32)
Calcium: 9.2 mg/dL (ref 8.9–10.3)
Chloride: 96 mmol/L — ABNORMAL LOW (ref 98–111)
Chloride: 98 mmol/L (ref 98–111)
Creatinine, Ser: 9.63 mg/dL — ABNORMAL HIGH (ref 0.61–1.24)
Creatinine, Ser: 11.03 mg/dL — ABNORMAL HIGH (ref 0.61–1.24)
GFR calc Af Amer: 7 mL/min — ABNORMAL LOW (ref >60)
GFR calc Af Amer: 6 mL/min — ABNORMAL LOW (ref >60)
GFR calc non Af Amer: 6 mL/min — ABNORMAL LOW (ref >60)
GFR calc non Af Amer: 5 mL/min — ABNORMAL LOW (ref >60)
GLUCOSE: 275 mg/dL — AB (ref 70–99)
Glucose, Bld: 308 mg/dL — ABNORMAL HIGH (ref 70–99)
Phosphorus: 5 mg/dL — ABNORMAL HIGH (ref 2.5–4.6)
Phosphorus: 4.4 mg/dL (ref 2.5–4.6)
Potassium: 3.5 mmol/L (ref 3.5–5.1)
Potassium: 4.8 mmol/L (ref 3.5–5.1)
SODIUM: 134 mmol/L — AB (ref 135–145)
Sodium: 133 mmol/L — ABNORMAL LOW (ref 135–145)

## 2018-04-21 LAB — CBC
HCT: 37.7 % — ABNORMAL LOW (ref 39.0–52.0)
Hemoglobin: 11.8 g/dL — ABNORMAL LOW (ref 13.0–17.0)
MCH: 28.4 pg (ref 26.0–34.0)
MCHC: 31.3 g/dL (ref 30.0–36.0)
MCV: 90.6 fL (ref 80.0–100.0)
Platelets: 308 10*3/uL (ref 150–400)
RBC: 4.16 MIL/uL — ABNORMAL LOW (ref 4.22–5.81)
RDW: 15.9 % — ABNORMAL HIGH (ref 11.5–15.5)
WBC: 5.8 10*3/uL (ref 4.0–10.5)
nRBC: 0 % (ref 0.0–0.2)

## 2018-04-21 LAB — GLUCOSE, CAPILLARY
Glucose-Capillary: 301 mg/dL — ABNORMAL HIGH (ref 70–99)
Glucose-Capillary: 240 mg/dL — ABNORMAL HIGH (ref 70–99)
Glucose-Capillary: 282 mg/dL — ABNORMAL HIGH (ref 70–99)
Glucose-Capillary: 281 mg/dL — ABNORMAL HIGH (ref 70–99)

## 2018-04-21 LAB — HEPARIN LEVEL (UNFRACTIONATED): Heparin Unfractionated: 0.39 IU/mL (ref 0.30–0.70)

## 2018-04-21 LAB — PROTIME-INR
INR: 1.1
Prothrombin Time: 14.1 seconds (ref 11.4–15.2)

## 2018-04-21 MED ORDER — WARFARIN SODIUM 7.5 MG PO TABS: 7.5000 mg | ORAL_TABLET | Freq: Once | ORAL | Status: DC

## 2018-04-21 MED ORDER — WARFARIN SODIUM 7.5 MG PO TABS
7.5000 mg | ORAL_TABLET | Freq: Once | ORAL | Status: AC
  Administered 2018-04-21: 7.5 mg via ORAL
  Filled 2018-04-21: qty 1

## 2018-04-21 NOTE — Progress Notes (Signed)
PROGRESS NOTE  Travis Bryan SWF:[id number] DOB: 1971-01-31 DOA: 03/28/2018 PCP: Travis Sage, MD  Brief History   Pt. with PMH of HTN, HLD, CKD III, A fib on coumadin, chronic NICM EF 25% AICD placement, MI, DM2; admitted on 03/28/2018, presented with complaint of fever and shortness of breath, was found to have sepsis secondary to pneumonia.  Had a respiratory failure requiring intubation followed by PEA arrest on 04/01/2018.  Renal function progressively worsened and required CVVHD currently on HD.  Advanced heart failure team was also consulted currently signed off.  Left IJ HD catheter inserted on 04/13/2018. The patient is awaiting arrangement for HD chair as outpatient. He underwent HD today. Vascular surgery has been consulted for placement of permacath and fistula. Vein mapping ha been ordered and the patient's coumadin is on hold. I have discussed these plans with vascular surgery.  A & P   Acute Hypoxic Respiratory Failure, multifactorial with moderate ARDS: Initially required mechanical ventilation. He was extubated on 04/16/2018. He has completed zosyn. Continue aggressive pulmonary toilet.  Metapneumovirus pneumonia with superimposed bacterial pneumonia complicated by decompensated HF and renal failure. The patient is currently saturating at 99% on 1 Liter O2 by nasal cannula. He has completed a course of IV Zosyn.  Acute kidney injury and chronic kidney disease stage III: Appreciate nephrology assistance. Pt is now ESRD on HD s/p CRRT. He is awaiting an outpatient HD chair. The patient will undergo vein mapping in preparation for placement of a permacath and fistula later this week.  PEA arrest secondary to respiratory failure: The patient had subsequent cardiogenic shock. History of nonischemic cardiomyopathy status post ICD. Heart failure team was consulted, currently signed off.  Paroxysmal atrial fibrillation. He has achieved negative volume status with HD. Coumadin is being  managed by pharmacy. He is being bridged on heparin. The patient's rate is currently stabilized on PO amiodarone  Cholelithiasis: Seen on CT abdomen without evidence of cholecystitis. AST, ALT, and Tbili are notmal, although alk phos was elevated. Possible cirrhosis as liver had a nodular appearance on CT. HIDA scan if further concern.  Suspected Ileus: Resolving. Pt is currently tolerating a renal diet with fluid restriction.  Coagulopathy/Suprtherapeutic -hbg /plt stable:  He was treated with vitamin K x1 in anticipation of tracheostomy tracheostomy is not needed therefore he will resume Coumadin as previously. Continue heparin for bridging.  Atrial fibrillation/atrial flutter status post ablation: CHADS-VASc Scoreof 4. Continue amiodarone 200 mgdaily andcoumadin/heparin per pharmacy  Hypertension: Home medications includeamlodipine 5 mg QD,carvedilol 25 mg QD, hydralazine 100 mg QD, Imdur 90 mg QD, and torsemide 60 mg QD. Currently all blood pressure medication on hold due to hypotension.  Type 2 diabetes uncontrolled with hyperglycemia: Most recent A1c is 10.3 Continue sliding scale insulin. Will add lantus as FSBS remains in the high 200's.  CAD/History of MI/Cardiomyopathy with AICD: Home medications include carvedilol,warfarin,atorstatin and Ranexa. Carvedilol and ranexa currently on hold due to hypotension. Continue atorvastatin and pharmacy is dosing warfarin.  Hyperlipidemia: Continue atorvastatin 40 mg daily  Gout: Dose of allopurinol has been reduced to 100 mg daily due to the patient's ESRD.   Pressure Injury 04/16/18 Stage II -  Partial thickness loss of dermis presenting as a shallow open ulcer with a red, pink wound bed without slough. size of nickel (Active)  04/16/18 0800  Location: Buttocks  Location Orientation: Left  Staging: Stage II -  Partial thickness loss of dermis presenting as a shallow open ulcer with a red, pink wound bed without  slough.    Wound Description (Comments): size of nickel  Present on Admission:      Pressure Injury 04/17/18 Stage II -  Partial thickness loss of dermis presenting as a shallow open ulcer with a red, pink wound bed without slough. (Active)  04/17/18 1710  Location: Coccyx  Location Orientation: Medial  Staging: Stage II -  Partial thickness loss of dermis presenting as a shallow open ulcer with a red, pink wound bed without slough.  Wound Description (Comments):   Present on Admission: Yes     Diet: renal diet DVT Prophylaxis: subcutaneous Heparin, transitioning to coumadin. Advance goals of care discussion: full code Family Communication: Emergency Contact is Travis Bryan (Relative) [phone] Disposition: Discharge to be determined.   Travis Hasler, DO [facility] Direct contact: see [email or web address]  7PM-7AM contact night coverage as above 04/21/2018, 12:52 PM  LOS: 24 days   Consultants  . Nephrology, Cardiology, Pulmonary Critical Care.  Interval History/Subjective  The patient is without new complaints. No acute distress.  Objective   Vitals:  Vitals:   04/21/18 0903 04/21/18 1248  BP: 119/86 90/62  Pulse: 81 78  Resp: (!) 22 (!) 22  Temp:    SpO2: 97% 95%    Exam:  Constitutional:  . The patient is awake, and alert. No acute distress. Respiratory:  . No wheezes, rales, or rhonchi.  . No increased work of breathing. Cardiovascular:  . Regular rate and rhythm. No murmurs, ectopy, or gallups. . No LE extremity edema   . Normal pedal pulses Abdomen:  . Abdomen is soft, non-tender, non-distended. . No hernias . No HSM Musculoskeletal:  . Digits/nails BUE: no clubbing, cyanosis, petechiae, infection . exam of joints, bones, muscles of at least one of following: head/neck, RUE, LUE, RLE, LLE   o strength and tone normal, no atrophy, no abnormal movements o No tenderness, masses o Normal ROM, no contractures  Skin:  . No rashes, lesions,  ulcers . palpation of skin: no induration or nodules Neurologic:  . CN 2-12 intact . Sensation all 4 extremities intact  I have seen and examined this patient myself. I have spent 32 minutes in his evaluation and care.  I have personally reviewed the following:   Today's Data  . CBC, CMP  Imaging  . CT abdomen  Other Data  . Vitals, I's and O's  Scheduled Meds: . albuterol  2.5 mg Nebulization TID  . allopurinol  100 mg Per Tube Daily  . amiodarone  200 mg Per Tube Daily  . atorvastatin  40 mg Per Tube q1800  . Chlorhexidine Gluconate Cloth  6 each Topical Q0600  . docusate  50 mg Per Tube BID  . insulin aspart  0-9 Units Subcutaneous TID WC  . insulin glargine  12 Units Subcutaneous QHS  . mouth rinse  15 mL Mouth Rinse BID  . nystatin  5 mL Oral QID  . pantoprazole sodium  40 mg Per Tube Daily  . polyethylene glycol  17 g Per Tube Daily  . sennosides  5 mL Per Tube BID  . sevelamer carbonate  800 mg Oral TID WC  . sodium chloride flush  10-40 mL Intracatheter Q12H  . Warfarin - Pharmacist Dosing Inpatient   Does not apply q1800   Continuous Infusions: . sodium chloride 10 mL/hr at 04/17/18 0036  . sodium chloride    . sodium chloride    . heparin 1,200 Units/hr (04/21/18 9622)    Principal Problem:   Acute respiratory failure  with hypoxia ([place]) Active Problems:   Hypertension associated with diabetes ([place])   Automatic implantable cardioverter-defibrillator in situ   Type 2 diabetes mellitus with complication, with long-term current use of insulin ([facility])   Acute on chronic systolic heart failure, NYHA class 4 ([facility])   Atrial fibrillation ([facility])   CAD (coronary artery disease)   Elevated troponin   AKI (acute kidney injury) ([place])   Bilateral pneumonia   Pressure injury of skin   LOS: 24 days

## 2018-04-21 NOTE — Progress Notes (Signed)
VASCULAR LAB PRELIMINARY  PRELIMINARY  PRELIMINARY  PRELIMINARY  Upper extremity vein mapping and arterial duplex completed.    Preliminary report:  See CV Proc  Sharion Dove, RVT 04/21/2018, 4:50 PM

## 2018-04-21 NOTE — Progress Notes (Addendum)
ANTICOAGULATION CONSULT NOTE   Pharmacy Consult for heparin/coumadin Indication: atrial fibrillation  Patient Measurements: Height: 5\' 10"  (177.8 cm) Weight: 217 lb 6 oz (98.6 kg) IBW/kg (Calculated) : 73  Vital Signs: Temp: 98.3 F (36.8 C) (02/08 2330) Temp Source: Oral (02/08 2330) BP: 119/86 (02/09 0903) Pulse Rate: 81 (02/09 0903)  Labs: Recent Labs    04/19/18 0322  04/19/18 1007  04/20/18 0304 04/20/18 1634 04/21/18 0253  HGB 12.8*  --   --   --  11.7*  --  11.8*  HCT 39.7  --   --   --  37.8*  --  37.7*  PLT 324  --   --   --  338  --  308  LABPROT 15.2  --   --   --  14.8  --  14.1  INR 1.21  --   --   --  1.17  --  1.10  HEPARINUNFRC  --    < > 0.62  --  0.48  --  0.39  CREATININE  --    < >  --    < > 11.87* 8.05* 9.63*   < > = values in this interval not displayed.    Assessment: 48 year old male presenting with NV and fever, h/o afib on warfarin PTA.  Warfarin held for possible procedure, vit K given 2/4, now pharmacy consulted to resume warfarin with heparin bridge.  Warfarin PTA dose 7.5mg  MWF, 5mg  all other days  Heparin level therapeutic at 0.39, INR subtherapeutic at 1.10 s/p first dose 2/8.  H/H low but stable, plts 308.    Goal of Therapy:  INR 2-3 Heparin level 0.3-0.7 units/ml Monitor platelets by anticoagulation protocol: Yes   Plan:  Continue heparin gtt at 1200 units/hr Warfarin 7.5 mg PO x 1 tonight Daily heparin level, INR, CBC and s/s bleeding  Bertis Ruddy, PharmD Clinical Pharmacist Please check AMION for all [place] numbers 04/21/2018 10:23 AM    Addendum: Now for AVF/graft placement tomorrow and coumadin to be held today Coumadin d/c for tonight

## 2018-04-21 NOTE — Progress Notes (Signed)
[facility]    NEPHROLOGY PROGRESS NOTE  SUBJECTIVE: Without acute complaints today.  Denies chest pain, shortness of breath, nausea, vomiting, diarrhea or dysuria.  Does complain of some ongoing fatigue.  All other review of systems are negative.  Vascular surgeon planning for a permacath and fistula tomorrow.     OBJECTIVE:  Vitals:   04/21/18 1248 04/21/18 1409  BP: 90/62   Pulse: 78 83  Resp: (!) 22 (!) 23  Temp:    SpO2: 95% 95%    Intake/Output Summary (Last 24 hours) at 04/21/2018 1602 Last data filed at 04/21/2018 1300 Gross per 24 hour  Intake 859.54 ml  Output -  Net 859.54 ml      Genearl:  AAOx3 NAD HEENT: MMM [place] AT anicteric sclera Neck:  No JVD, no adenopathy left IJ temporary dialysis catheter CV:  Heart RRR  Lungs:  L/S CTA bilaterally Abd:  abd SNT/ND with normal BS GU:  Bladder non-palpable Extremities:  No LE edema. Skin:  No skin rash  MEDICATIONS:  . albuterol  2.5 mg Nebulization TID  . allopurinol  100 mg Per Tube Daily  . amiodarone  200 mg Per Tube Daily  . atorvastatin  40 mg Per Tube q1800  . Chlorhexidine Gluconate Cloth  6 each Topical Q0600  . docusate  50 mg Per Tube BID  . insulin aspart  0-9 Units Subcutaneous TID WC  . insulin glargine  12 Units Subcutaneous QHS  . mouth rinse  15 mL Mouth Rinse BID  . nystatin  5 mL Oral QID  . pantoprazole sodium  40 mg Per Tube Daily  . polyethylene glycol  17 g Per Tube Daily  . sennosides  5 mL Per Tube BID  . sevelamer carbonate  800 mg Oral TID WC  . sodium chloride flush  10-40 mL Intracatheter Q12H  . Warfarin - Pharmacist Dosing Inpatient   Does not apply q1800       LABS:   CBC Latest Ref Rng & Units 04/21/2018 04/20/2018 04/19/2018  WBC 4.0 - 10.5 K/uL 5.8 7.2 8.5  Hemoglobin 13.0 - 17.0 g/dL 11.8(L) 11.7(L) 12.8(L)  Hematocrit 39.0 - 52.0 % 37.7(L) 37.8(L) 39.7  Platelets 150 - 400 K/uL 308 338 324    CMP Latest Ref Rng & Units 04/21/2018 04/20/2018 04/20/2018   Glucose 70 - 99 mg/dL 308(H) 294(H) 337(H)  BUN 6 - 20 mg/dL 60(H) 51(H) 96(H)  Creatinine 0.61 - 1.24 mg/dL 9.63(H) 8.05(H) 11.87(H)  Sodium 135 - 145 mmol/L 134(L) 132(L) 130(L)  Potassium 3.5 - 5.1 mmol/L 3.5 3.3(L) 3.6  Chloride 98 - 111 mmol/L 96(L) 97(L) 94(L)  CO2 22 - 32 mmol/L 20(L) 20(L) 19(L)  Calcium 8.9 - 10.3 mg/dL 9.2 8.8(L) 9.4  Total Protein 6.5 - 8.1 g/dL - - -  Total Bilirubin 0.3 - 1.2 mg/dL - - -  Alkaline Phos 38 - 126 U/L - - -  AST 15 - 41 U/L - - -  ALT 0 - 44 U/L - - -    Lab Results  Component Value Date   CALCIUM 9.2 04/21/2018   CAION 1.19 04/15/2018   PHOS 5.0 (H) 04/21/2018       Component Value Date/Time   COLORURINE YELLOW 03/28/2018 [place] 03/28/2018 1040   LABSPEC 1.015 03/28/2018 1040   PHURINE 7.0 03/28/2018 1040   GLUCOSEU 50 (A) 03/28/2018 1040   HGBUR NEGATIVE 03/28/2018 [place] 03/28/2018 [place] 03/28/2018  1040   PROTEINUR 100 (A) 03/28/2018 1040   UROBILINOGEN 0.2 12/18/2006 0428   NITRITE NEGATIVE 03/28/2018 1040   LEUKOCYTESUR NEGATIVE 03/28/2018 1040      Component Value Date/Time   PHART 7.330 (L) 04/15/2018 0435   PCO2ART 47.7 04/15/2018 0435   PO2ART 101.0 04/15/2018 0435   HCO3 25.1 04/15/2018 0435   TCO2 27 04/15/2018 0435   ACIDBASEDEF 1.0 04/15/2018 0435   O2SAT 97.0 04/15/2018 0435       Component Value Date/Time   IRON 56 12/18/2006 0736   TIBC 314 12/18/2006 0736   FERRITIN 65 12/18/2006 0736   IRONPCTSAT 18 (L) 12/18/2006 0736       ASSESSMENT/PLAN:     1.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 S/p CRRT- came off 2/4.Plan dialysis on Tuesday. Will need to trend Cr- maybe not recoverable but remains to be seen.    Minimal urine output noted.  We will plan for permacath and fistula.  2. Acute hypoxic  respiratory failure:With evidence of ARDS and preceding viral pneumonia/superimposed HCAP versus aspiration pneumonia-gotZosyn.extubated  3.Anemia:Hemoglobin stable.  No further darbepoetin at this time.  4. History of atrial fibrillation/atrial flutter status post DCCV and ablation.He is currently rate controlled  5. Acute exacerbation of CHF with cardiogenic shock:per primary,   6.  Hyperphosphatemia.  We will continue phosphate binder.  7. Dispo: Will need CLIP for dialysis as an outpatient if no improvement in renal function.  Will monitor urine output.     [place], DO, [place]

## 2018-04-21 NOTE — Progress Notes (Signed)
[facility]  Subjective  - No complaints.   Objective 119/86 81 98.3 F (36.8 C) (Oral) (!) 22 97%  Intake/Output Summary (Last 24 hours) at 04/21/2018 1118 Last data filed at 04/21/2018 0659 Gross per 24 hour  Intake 287.34 ml  Output 800 ml  Net -512.66 ml    Left IJ temporary catheter Left radial and brachial pulse palpable  Laboratory Lab Results: Recent Labs    04/20/18 0304 04/21/18 0253  WBC 7.2 5.8  HGB 11.7* 11.8*  HCT 37.8* 37.7*  PLT 338 308   BMET Recent Labs    04/20/18 1634 04/21/18 0253  NA 132* 134*  K 3.3* 3.5  CL 97* 96*  CO2 20* 20*  GLUCOSE 294* 308*  BUN 51* 60*  CREATININE 8.05* 9.63*  CALCIUM 8.8* 9.2    COAG Lab Results  Component Value Date   INR 1.10 04/21/2018   INR 1.17 04/20/2018   INR 1.21 04/19/2018   No results found for: PTT  Assessment/Planning:  Discussed with Dr. Benny Lennert to hold coumadin tonight.  Will get vein mapping today.  Will attempt left arm AVF vs graft tomorrow since right handed and either place RIJ pem cath vs exchange LIJ temporary catheter.  Please have NPO after midnight.  Marty Heck 04/21/2018 11:18 AM --

## 2018-04-22 ENCOUNTER — Inpatient Hospital Stay (HOSPITAL_COMMUNITY): Payer: Medicaid Other

## 2018-04-22 LAB — CBC
HCT: 34.9 % — ABNORMAL LOW (ref 39.0–52.0)
HEMOGLOBIN: 11.3 g/dL — AB (ref 13.0–17.0)
MCH: 29.5 pg (ref 26.0–34.0)
MCHC: 32.4 g/dL (ref 30.0–36.0)
MCV: 91.1 fL (ref 80.0–100.0)
Platelets: 312 10*3/uL (ref 150–400)
RBC: 3.83 MIL/uL — ABNORMAL LOW (ref 4.22–5.81)
RDW: 16 % — ABNORMAL HIGH (ref 11.5–15.5)
WBC: 5.4 10*3/uL (ref 4.0–10.5)
nRBC: 0 % (ref 0.0–0.2)

## 2018-04-22 LAB — RENAL FUNCTION PANEL
Albumin: 2.3 g/dL — ABNORMAL LOW (ref 3.5–5.0)
Anion gap: 19 — ABNORMAL HIGH (ref 5–15)
BUN: 79 mg/dL — ABNORMAL HIGH (ref 6–20)
CO2: 18 mmol/L — ABNORMAL LOW (ref 22–32)
Calcium: 9.4 mg/dL (ref 8.9–10.3)
Chloride: 97 mmol/L — ABNORMAL LOW (ref 98–111)
Creatinine, Ser: 12.68 mg/dL — ABNORMAL HIGH (ref 0.61–1.24)
GFR calc Af Amer: 5 mL/min — ABNORMAL LOW (ref >60)
GFR calc non Af Amer: 4 mL/min — ABNORMAL LOW (ref >60)
GLUCOSE: 325 mg/dL — AB (ref 70–99)
PHOSPHORUS: 5.6 mg/dL — AB (ref 2.5–4.6)
Potassium: 3.6 mmol/L (ref 3.5–5.1)
Sodium: 134 mmol/L — ABNORMAL LOW (ref 135–145)

## 2018-04-22 LAB — HEPATITIS B SURFACE ANTIGEN: Hepatitis B Surface Ag: NEGATIVE

## 2018-04-22 LAB — PROTIME-INR
INR: 1.2
PROTHROMBIN TIME: 15.1 s (ref 11.4–15.2)

## 2018-04-22 LAB — HEPARIN LEVEL (UNFRACTIONATED)
Heparin Unfractionated: 0.26 IU/mL — ABNORMAL LOW (ref 0.30–0.70)
Heparin Unfractionated: 0.24 IU/mL — ABNORMAL LOW (ref 0.30–0.70)

## 2018-04-22 LAB — HEPATITIS B SURFACE ANTIBODY,QUALITATIVE: Hep B S Ab: NONREACTIVE

## 2018-04-22 LAB — GLUCOSE, CAPILLARY
Glucose-Capillary: 337 mg/dL — ABNORMAL HIGH (ref 70–99)
Glucose-Capillary: 321 mg/dL — ABNORMAL HIGH (ref 70–99)
Glucose-Capillary: 250 mg/dL — ABNORMAL HIGH (ref 70–99)
Glucose-Capillary: 205 mg/dL — ABNORMAL HIGH (ref 70–99)

## 2018-04-22 LAB — HEPATITIS B CORE ANTIBODY, TOTAL: HEP B C TOTAL AB: NEGATIVE

## 2018-04-22 MED ORDER — CHLORHEXIDINE GLUCONATE CLOTH 2 % EX PADS
6.0000 | MEDICATED_PAD | Freq: Every day | CUTANEOUS | Status: DC
  Administered 2018-04-22 – 2018-04-26 (×5): 6 via TOPICAL

## 2018-04-22 MED ORDER — SENNOSIDES-DOCUSATE SODIUM 8.6-50 MG PO TABS
1.0000 | ORAL_TABLET | Freq: Two times a day (BID) | ORAL | Status: DC
  Administered 2018-04-24 – 2018-04-26 (×2): 1 via ORAL
  Filled 2018-04-22 (×4): qty 1

## 2018-04-22 MED ORDER — INSULIN GLARGINE 100 UNIT/ML ~~LOC~~ SOLN
20.0000 [IU] | Freq: Every day | SUBCUTANEOUS | Status: DC
  Administered 2018-04-22 – 2018-04-25 (×4): 20 [IU] via SUBCUTANEOUS
  Filled 2018-04-22 (×5): qty 0.2

## 2018-04-22 MED ORDER — DOCUSATE SODIUM 50 MG/5ML PO LIQD: 50.0000 mg | Freq: Two times a day (BID) | ORAL | Status: DC

## 2018-04-22 MED ORDER — ALLOPURINOL 100 MG PO TABS
100.0000 mg | ORAL_TABLET | Freq: Every day | ORAL | Status: DC
  Administered 2018-04-23 – 2018-04-26 (×3): 100 mg via ORAL
  Filled 2018-04-22 (×3): qty 1

## 2018-04-22 MED ORDER — SENNOSIDES 8.8 MG/5ML PO SYRP: 5.0000 mL | ORAL_SOLUTION | Freq: Two times a day (BID) | ORAL | Status: DC

## 2018-04-22 MED ORDER — AMIODARONE HCL 200 MG PO TABS
200.0000 mg | ORAL_TABLET | Freq: Every day | ORAL | Status: DC
  Administered 2018-04-23 – 2018-04-26 (×4): 200 mg via ORAL
  Filled 2018-04-22 (×4): qty 1

## 2018-04-22 MED ORDER — ATORVASTATIN CALCIUM 40 MG PO TABS
40.0000 mg | ORAL_TABLET | Freq: Every day | ORAL | Status: DC
  Administered 2018-04-22 – 2018-04-25 (×4): 40 mg via ORAL
  Filled 2018-04-22 (×4): qty 1

## 2018-04-22 MED ORDER — PANTOPRAZOLE SODIUM 40 MG PO TBEC
40.0000 mg | DELAYED_RELEASE_TABLET | Freq: Every day | ORAL | Status: DC
  Administered 2018-04-23 – 2018-04-26 (×3): 40 mg via ORAL
  Filled 2018-04-22 (×3): qty 1

## 2018-04-22 MED ORDER — PANTOPRAZOLE SODIUM 40 MG PO PACK: 40.0000 mg | PACK | Freq: Every day | ORAL | Status: DC

## 2018-04-22 MED ORDER — POLYETHYLENE GLYCOL 3350 17 G PO PACK
17.0000 g | PACK | Freq: Every day | ORAL | Status: DC
  Administered 2018-04-24 – 2018-04-26 (×2): 17 g via ORAL
  Filled 2018-04-22 (×3): qty 1

## 2018-04-22 NOTE — Progress Notes (Signed)
PROGRESS NOTE  Travis Bryan BJY:[id number] DOB: 1970/08/29 DOA: 03/28/2018 PCP: Carroll Sage, MD  Brief History   Pt. with PMH of HTN, HLD, CKD III, A fib on coumadin, chronic NICM EF 25% AICD placement, MI, DM2; admitted on 03/28/2018, presented with complaint of fever and shortness of breath, was found to have sepsis secondary to pneumonia.  Had a respiratory failure requiring intubation followed by PEA arrest on 04/01/2018.  Renal function progressively worsened and required CVVHD currently on HD.  Advanced heart failure team was also consulted currently signed off.  Left IJ HD catheter inserted on 04/13/2018. The patient is awaiting arrangement for HD chair as outpatient. He underwent HD today. Vascular surgery has been consulted for placement of permacath and fistula. Vein mapping ha been ordered and the patient's coumadin is on hold. I have discussed these plans with vascular surgery.  A & P   Acute Hypoxic Respiratory Failure, multifactorial with moderate ARDS: Resolved. Initially required mechanical ventilation. He was extubated on 04/16/2018. He has completed zosyn. Continue aggressive pulmonary toilet. He is now saturating 93% on room air.   Meta-pneumovirus pneumonia with superimposed bacterial pneumonia complicated by decompensated HF and renal failure: The patient is currently saturating at 93% on room air. He has completed a course of IV Zosyn.  Acute kidney injury and chronic kidney disease stage III: Appreciate nephrology assistance. Pt is now ESRD on HD s/p CRRT. He is awaiting an outpatient HD chair. The patient will undergo vein mapping in preparation for placement of a permacath and fistula later this week. I have discussed this with vascular surgery. It seems that the patient was gone for a barium swallow today and therefore was not NPO as he would need to be for his procedure. Apparently this will not be able to be done until Thursday now.  PEA arrest secondary to respiratory  failure: The patient had subsequent cardiogenic shock. History of nonischemic cardiomyopathy status post ICD. Heart failure team was consulted, currently signed off.  Paroxysmal atrial fibrillation. He has achieved negative volume status with HD. Coumadin is being managed by pharmacy. He is being bridged on heparin. The patient's rate is currently stabilized on PO amiodarone  Cholelithiasis: Seen on CT abdomen without evidence of cholecystitis. AST, ALT, and Tbili are notmal, although alk phos was elevated. Possible cirrhosis as liver had a nodular appearance on CT. HIDA scan if further concern.  Suspected Ileus: Resolving. Pt is currently tolerating a renal diet with fluid restriction.  Coagulopathy/Suprtherapeutic: Pt hbg /plt have been stable: He was treated with vitamin K x1 in anticipation of tracheostomy tracheostomy is not needed therefore he will resume Coumadin as previously. Continue heparin for bridging.  Atrial fibrillation/atrial flutter status post ablation: CHADS-VASc Scoreof 4. Continue amiodarone 200 mgdaily andcoumadin/heparin per pharmacy. Will continue heparin drip as the patient will be going for permacath placement later this week.  Hypertension: Home medications includeamlodipine 5 mg QD,carvedilol 25 mg QD, hydralazine 100 mg QD, Imdur 90 mg QD, and torsemide 60 mg QD. Currently all blood pressure medication on hold due to hypotension.  Type 2 diabetes uncontrolled with hyperglycemia: Most recent A1c is 10.3 Continue sliding scale insulin. Will add lantus as FSBS remains in the high 200's. Lantus has been increased to 20 units sub Q daily.  CAD/History of MI/Cardiomyopathy with AICD: Home medications include carvedilol,warfarin,atorstatin and Ranexa. Carvedilol and ranexa currently on hold due to hypotension. Continue atorvastatin and pharmacy is dosing warfarin.  Hyperlipidemia: Continue atorvastatin 40 mg daily  Gout: Dose of  allopurinol has been  reduced to 100 mg daily due to the patient's ESRD.   Pressure Injury 04/16/18 Stage II -  Partial thickness loss of dermis presenting as a shallow open ulcer with a red, pink wound bed without slough. size of nickel (Active)  04/16/18 0800  Location: Buttocks  Location Orientation: Left  Staging: Stage II -  Partial thickness loss of dermis presenting as a shallow open ulcer with a red, pink wound bed without slough.  Wound Description (Comments): size of nickel  Present on Admission:      Pressure Injury 04/17/18 Stage II -  Partial thickness loss of dermis presenting as a shallow open ulcer with a red, pink wound bed without slough. (Active)  04/17/18 1710  Location: Coccyx  Location Orientation: Medial  Staging: Stage II -  Partial thickness loss of dermis presenting as a shallow open ulcer with a red, pink wound bed without slough.  Wound Description (Comments):   Present on Admission: Yes     Diet: renal diet DVT Prophylaxis: subcutaneous Heparin, transitioning to coumadin. Advance goals of care discussion: full code Family Communication: Emergency Contact is Roxanne Gates (Relative) [phone] Disposition: Discharge to be determined.   Travis Frate, DO [facility] Direct contact: see [email or web address]  7PM-7AM contact night coverage as above 04/22/2018, 2:18 PM  LOS: 25 days   Consultants  . Nephrology, Cardiology, Pulmonary Critical Care.  Interval History/Subjective  The patient is without new complaints. No acute distress.  Objective   Vitals:  Vitals:   04/22/18 1100 04/22/18 1338  BP: (!) 119/93   Pulse: 77   Resp: (!) 22   Temp: 98.6 F (37 C)   SpO2: 93% 93%    Exam:  Constitutional:  . The patient is awake, and alert. No acute distress. Respiratory:  . No wheezes, rales, or rhonchi.  . No increased work of breathing. Cardiovascular:  . Regular rate and rhythm. No murmurs, ectopy, or gallups. . No LE extremity edema   . Normal pedal  pulses Abdomen:  . Abdomen is soft, non-tender, non-distended. . No hernias . No HSM Musculoskeletal:  . Digits/nails BUE: no clubbing, cyanosis, petechiae, infection . exam of joints, bones, muscles of at least one of following: head/neck, RUE, LUE, RLE, LLE   o strength and tone normal, no atrophy, no abnormal movements o No tenderness, masses o Normal ROM, no contractures  Skin:  . No rashes, lesions, ulcers . palpation of skin: no induration or nodules Neurologic:  . CN 2-12 intact . Sensation all 4 extremities intact  I have seen and examined this patient myself. I have spent 32 minutes in his evaluation and care.  I have personally reviewed the following:   Today's Data  . CBC, CMP  Imaging  . CT abdomen  Other Data  . Vitals, I's and O's  Scheduled Meds: . albuterol  2.5 mg Nebulization TID  . [START ON 04/23/2018] allopurinol  100 mg Oral Daily  . [START ON 04/23/2018] amiodarone  200 mg Oral Daily  . atorvastatin  40 mg Oral q1800  . Chlorhexidine Gluconate Cloth  6 each Topical Q0600  . insulin aspart  0-9 Units Subcutaneous TID WC  . insulin glargine  20 Units Subcutaneous QHS  . mouth rinse  15 mL Mouth Rinse BID  . nystatin  5 mL Oral QID  . [START ON 04/23/2018] pantoprazole  40 mg Oral Daily  . [START ON 04/23/2018] polyethylene glycol  17 g Oral Daily  . senna-docusate  1  tablet Oral BID  . sevelamer carbonate  800 mg Oral TID WC  . sodium chloride flush  10-40 mL Intracatheter Q12H  . Warfarin - Pharmacist Dosing Inpatient   Does not apply q1800   Continuous Infusions: . heparin 1,300 Units/hr (04/22/18 1354)    Principal Problem:   Acute respiratory failure with hypoxia (HCC) Active Problems:   Hypertension associated with diabetes ([place])   Automatic implantable cardioverter-defibrillator in situ   Type 2 diabetes mellitus with complication, with long-term current use of insulin (HCC)   Acute on chronic systolic heart failure, NYHA class 4  (HCC)   Atrial fibrillation (HCC)   CAD (coronary artery disease)   Elevated troponin   AKI (acute kidney injury) ([place])   Bilateral pneumonia   Pressure injury of skin   LOS: 25 days

## 2018-04-22 NOTE — Progress Notes (Addendum)
Modified Barium Swallow Progress Note  Patient Details  Name: Travis Bryan MRN: [id number] Date of Birth: 08-Mar-1971  Today's Date: 04/22/2018  Modified Barium Swallow completed.  Full report located under Chart Review in the Imaging Section.  Brief recommendations include the following:  Clinical Impression  Pt demonstrates resolved dysphagia c/b adequate laryngeal sensation, laryngeal closure and swallow initiation. He tolerated all liquid consistencies with no signs of aspiration. Penetration x2 occured, which pt immediately ejected/throat cleared. Strength of laryngeal closure and throat clear ejected liquid trials completely. Adequate mastication and swallow initiation observed during consumption of mechanical soft and regular textured solids. Given improvements in vocal quality, mentation and overall strength will initiate regular, thin diet. SLP to f/u for tolerance.   Swallow Evaluation Recommendations       SLP Diet Recommendations: Regular solids;Thin liquid   Liquid Administration via: Cup;Straw   Medication Administration: Whole meds with liquid   Supervision: Patient able to self feed       Postural Changes: Seated upright at 90 degrees   Oral Care Recommendations: Oral care BID        [name], SLP Student 04/22/2018,1:46 PM

## 2018-04-22 NOTE — Progress Notes (Signed)
ANTICOAGULATION CONSULT NOTE   Pharmacy Consult for Heparin  Indication: atrial fibrillation  Patient Measurements: Height: 5\' 10"  (177.8 cm) Weight: 217 lb 6 oz (98.6 kg) IBW/kg (Calculated) : 73  Vital Signs: Temp: 98.6 F (37 C) (02/10 2253) Temp Source: Oral (02/10 2253) BP: 137/89 (02/10 2253) Pulse Rate: 83 (02/10 2253)  Labs: Recent Labs    04/20/18 0304  04/21/18 0253 04/21/18 1652 04/22/18 0553 04/22/18 2205  HGB 11.7*  --  11.8*  --  11.3*  --   HCT 37.8*  --  37.7*  --  34.9*  --   PLT 338  --  308  --  312  --   LABPROT 14.8  --  14.1  --  15.1  --   INR 1.17  --  1.10  --  1.20  --   HEPARINUNFRC 0.48  --  0.39  --  0.26* 0.24*  CREATININE 11.87*   < > 9.63* 11.03* 12.68*  --    < > = values in this interval not displayed.    Assessment: 48 year old male presenting with NV and fever, h/o afib on Coumadin PTA.  Coumadin held for procedures, Vitamin K given 2/4.  Pharmacy consulted for heparin while off Coumadin. Coumadin resumed on 2/8, but now held for AVF vs graft.  Procedure planned for today but RN reports cancelled d/t was not NPO.  Heparin level remains slightly subtherapeutic (0.24) on gtt at 1300 units/hr. No issues with line or bleeding reported per RN.   Goal of Therapy:  INR 2-3 Heparin level 0.3-0.7 units/ml Monitor platelets by anticoagulation protocol: Yes   Plan:   Increase heparin drip to 1450 units/hr Will f/u 6 hr heparin level  Sherlon Handing, PharmD, BCPS Clinical pharmacist  **Pharmacist phone directory can now be found on [email or web address] ([email or web address]).  Listed under [place]. 04/22/2018 11:15 PM

## 2018-04-22 NOTE — Progress Notes (Signed)
48 year old male scheduled for left upper extremity AV fistula and dialysis catheter exchange today.  Had discussed plan of care for surgery with the hospitalist as well as the patient's nephrologist over the weekend and had requested NPO.  When OR called for patient he was in radiology and had received a barium swallow study.  Ultimately this is going to cancel his case today because he needs to be n.p.o. for 8 hours after the barium study.  Discussed with patient that I will reschedule for Thursday for left arm AVF and dialysis catheter exchange.  Marty Heck, MD [facility] Office: [phone] Pager: [place]

## 2018-04-22 NOTE — Progress Notes (Addendum)
[place] for heparin/coumadin Indication: atrial fibrillation  Patient Measurements: Height: 5\' 10"  (177.8 cm) Weight: 217 lb 6 oz (98.6 kg) IBW/kg (Calculated) : 73  Vital Signs: Temp: 98.4 F (36.9 C) (02/10 0448) Temp Source: Oral (02/10 0448) BP: 116/67 (02/10 0448) Pulse Rate: 80 (02/10 0448)  Labs: Recent Labs    04/20/18 0304 04/20/18 1634 04/21/18 0253 04/21/18 1652 04/22/18 0553  HGB 11.7*  --  11.8*  --  11.3*  HCT 37.8*  --  37.7*  --  34.9*  PLT 338  --  308  --  312  LABPROT 14.8  --  14.1  --  15.1  INR 1.17  --  1.10  --  1.20  HEPARINUNFRC 0.48  --  0.39  --  0.26*  CREATININE 11.87* 8.05* 9.63* 11.03*  --     Assessment: 48 year old male presenting with NV and fever, h/o afib on warfarin PTA.  Warfarin held for possible procedure, vit K given 2/4, now pharmacy consulted to resume warfarin with heparin bridge.  Warfarin PTA dose 7.5mg  MWF, 5mg  all other days  Heparin level down to slightly subtherapeutic (0.26) on gtt at 1200 units/hr. INR subtherapeutic at 1.2 -noted coumadin held last night as pt to OR today for AV graft placement.  Hgb stable, plt wnl.    Goal of Therapy:  INR 2-3 Heparin level 0.3-0.7 units/ml Monitor platelets by anticoagulation protocol: Yes   Plan:  Will continue heparin 1200 units/hr right now as pt to surgery at 1215 today. Will f/u post AV graft placement for resuming coumadin and for heparin restart post-OR  Sherlon Handing, PharmD, BCPS Clinical pharmacist  **Pharmacist phone directory can now be found on [email or web address] ([email or web address]).  Listed under [place]. 04/22/2018 7:03 AM

## 2018-04-22 NOTE — Progress Notes (Signed)
[facility]    NEPHROLOGY PROGRESS NOTE  SUBJECTIVE: Without acute complaints today.  Denies chest pain, shortness of breath, nausea, vomiting, diarrhea or dysuria.  Does complain of some ongoing fatigue.  All other review of systems are negative.  Vascular surgeon planning for a permacath and fistula      OBJECTIVE:  Vitals:   04/22/18 0729 04/22/18 0820  BP: 114/70   Pulse: 79   Resp:    Temp: 98.5 F (36.9 C)   SpO2: 92% 97%    Intake/Output Summary (Last 24 hours) at 04/22/2018 1057 Last data filed at 04/22/2018 0659 Gross per 24 hour  Intake 591.47 ml  Output -  Net 591.47 ml      Genearl:  AAOx3 NAD HEENT: MMM [place] AT anicteric sclera Neck:  No JVD, no adenopathy left IJ temporary dialysis catheter CV:  Heart RRR  Lungs:  L/S CTA bilaterally Abd:  abd SNT/ND with normal BS GU:  Bladder non-palpable Extremities:  No LE edema. Skin:  No skin rash  MEDICATIONS:  . albuterol  2.5 mg Nebulization TID  . [START ON 04/23/2018] allopurinol  100 mg Oral Daily  . [START ON 04/23/2018] amiodarone  200 mg Oral Daily  . atorvastatin  40 mg Oral q1800  . insulin aspart  0-9 Units Subcutaneous TID WC  . insulin glargine  12 Units Subcutaneous QHS  . mouth rinse  15 mL Mouth Rinse BID  . nystatin  5 mL Oral QID  . [START ON 04/23/2018] pantoprazole  40 mg Oral Daily  . [START ON 04/23/2018] polyethylene glycol  17 g Oral Daily  . senna-docusate  1 tablet Oral BID  . sevelamer carbonate  800 mg Oral TID WC  . sodium chloride flush  10-40 mL Intracatheter Q12H  . Warfarin - Pharmacist Dosing Inpatient   Does not apply q1800       LABS:   CBC Latest Ref Rng & Units 04/22/2018 04/21/2018 04/20/2018  WBC 4.0 - 10.5 K/uL 5.4 5.8 7.2  Hemoglobin 13.0 - 17.0 g/dL 11.3(L) 11.8(L) 11.7(L)  Hematocrit 39.0 - 52.0 % 34.9(L) 37.7(L) 37.8(L)  Platelets 150 - 400 K/uL 312 308 338    CMP Latest Ref Rng & Units 04/22/2018 04/21/2018 04/21/2018  Glucose 70 - 99 mg/dL 325(H)  275(H) 308(H)  BUN 6 - 20 mg/dL 79(H) 71(H) 60(H)  Creatinine 0.61 - 1.24 mg/dL 12.68(H) 11.03(H) 9.63(H)  Sodium 135 - 145 mmol/L 134(L) 133(L) 134(L)  Potassium 3.5 - 5.1 mmol/L 3.6 4.8 3.5  Chloride 98 - 111 mmol/L 97(L) 98 96(L)  CO2 22 - 32 mmol/L 18(L) 17(L) 20(L)  Calcium 8.9 - 10.3 mg/dL 9.4 9.2 9.2  Total Protein 6.5 - 8.1 g/dL - - -  Total Bilirubin 0.3 - 1.2 mg/dL - - -  Alkaline Phos 38 - 126 U/L - - -  AST 15 - 41 U/L - - -  ALT 0 - 44 U/L - - -    Lab Results  Component Value Date   CALCIUM 9.4 04/22/2018   CAION 1.19 04/15/2018   PHOS 5.6 (H) 04/22/2018       Component Value Date/Time   COLORURINE YELLOW 03/28/2018 1040   APPEARANCEUR CLEAR 03/28/2018 1040   LABSPEC 1.015 03/28/2018 1040   PHURINE 7.0 03/28/2018 1040   GLUCOSEU 50 (A) 03/28/2018 1040   HGBUR NEGATIVE 03/28/2018 1040   BILIRUBINUR NEGATIVE 03/28/2018 1040   KETONESUR NEGATIVE 03/28/2018 1040   PROTEINUR 100 (A) 03/28/2018 1040   UROBILINOGEN 0.2 12/18/2006  0428   NITRITE NEGATIVE 03/28/2018 1040   LEUKOCYTESUR NEGATIVE 03/28/2018 1040      Component Value Date/Time   PHART 7.330 (L) 04/15/2018 0435   PCO2ART 47.7 04/15/2018 0435   PO2ART 101.0 04/15/2018 0435   HCO3 25.1 04/15/2018 0435   TCO2 27 04/15/2018 0435   ACIDBASEDEF 1.0 04/15/2018 0435   O2SAT 97.0 04/15/2018 0435       Component Value Date/Time   IRON 56 12/18/2006 0736   TIBC 314 12/18/2006 0736   FERRITIN 65 12/18/2006 0736   IRONPCTSAT 18 (L) 12/18/2006 0736       ASSESSMENT/PLAN:     1.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 S/p CRRT- came off 2/4.Plan dialysis on Tuesday. Will need to trend Cr- maybe not recoverable but remains to be seen.    Minimal urine output noted.  We will plan for permacath and fistula.  2. Acute hypoxic respiratory failure:With evidence of ARDS and  preceding viral pneumonia/superimposed HCAP versus aspiration pneumonia-gotZosyn.extubated  3.Anemia:Hemoglobin stable.  No further darbepoetin at this time.  4. History of atrial fibrillation/atrial flutter status post DCCV and ablation.He is currently rate controlled  5. Acute exacerbation of CHF with cardiogenic shock:per primary,   6.  Hyperphosphatemia.  We will continue phosphate binder.  7.  DM.  Needs tighter diabetes control.  8. Dispo: CLIP initiated.  No sign of renal recovery.  Will monitor urine output.     [place], DO, [place]

## 2018-04-22 NOTE — Progress Notes (Signed)
ANTICOAGULATION CONSULT NOTE   Pharmacy Consult for Heparin and Coumadin Indication: atrial fibrillation  Patient Measurements: Height: 5\' 10"  (177.8 cm) Weight: 217 lb 6 oz (98.6 kg) IBW/kg (Calculated) : 73  Vital Signs: Temp: 98.6 F (37 C) (02/10 1100) Temp Source: Oral (02/10 1100) BP: 119/93 (02/10 1100) Pulse Rate: 77 (02/10 1100)  Labs: Recent Labs    04/20/18 0304  04/21/18 0253 04/21/18 1652 04/22/18 0553  HGB 11.7*  --  11.8*  --  11.3*  HCT 37.8*  --  37.7*  --  34.9*  PLT 338  --  308  --  312  LABPROT 14.8  --  14.1  --  15.1  INR 1.17  --  1.10  --  1.20  HEPARINUNFRC 0.48  --  0.39  --  0.26*  CREATININE 11.87*   < > 9.63* 11.03* 12.68*   < > = values in this interval not displayed.    Assessment: 48 year old male presenting with NV and fever, h/o afib on Coumadin PTA.  Coumadin held for procedures, Vitamin K given 2/4.  Pharmacy consulted for heparin while off Coumadin. Coumadin resumed on 2/8, but now held for AVF vs graft.  Procedure planned for today but RN reports cancelled d/t was not NPO.    Heparin level slightly subtherapeutic (0.26) today.  INR 1.20.  Coumadin 7.5 mg given 2/8 and 2/9, though intent was to hold Coumadin on 2/9.   PTA Coumadin dose: 7.5mg  MWF, 5mg  all other days.  Goal of Therapy:  INR 2-3 Heparin level 0.3-0.7 units/ml Monitor platelets by anticoagulation protocol: Yes   Plan:   Increase heparin drip to 1300 units/hr  Heparin level ~8 hrs after rate change.  Heparin level, PT/INR and CBC daily.  Hold Coumadin today.  Resume after procedure.  Arty Baumgartner, [place] Pager: [phone] or phone: [phone] 04/22/2018 1:50 PM

## 2018-04-22 NOTE — Progress Notes (Addendum)
  Speech Language Pathology Treatment: Dysphagia  Patient Details Name: Travis Bryan MRN: [id number] DOB: 20-Oct-1970 Today's Date: 04/22/2018 Time: 0920-0930 SLP Time Calculation (min) (ACUTE ONLY): 10 min  Assessment / Plan / Recommendation Clinical Impression  Pt reports some coughing on honey-thick liquids over the past few days and that he has not been using recommended throat clear strategy. During session he consumed honey and nectar-thick consistencies with min verbal cues to take small sips. Throat clear x2 exhibited following question cues from SLP. Mixed report from pt as to if this was an attempt to use recommended strategy or if he just needed to throat clear. Pt passed 3oz water test and reported/displayed no s/sx of discomfort. Delayed throat clear x1 following test may be indication for increased laryngeal sensation. Given hx of prolonged intubation, breathy/hoarse vocal quality and decreased laryngeal sensation (as indicated by silent aspiration on MBS 2/5) recommend f/u MBS to determine safe diet advancement. Till then recommend continuation of Dys 2, honey-thick diet.   HPI HPI: Pt is a 48 year old male with HTN, cardiomyopathy with AICD, atrial fibrillation, CHF and diabetes presenting with a one day history of chills and vomiting. Pt admitted 03/29/18 for acute hypoxemic respiratory failure secondary to CHF exacerbation and pneumonia. Suffered PEA arrest on 1/20 after episode of emesis and likely aspiration.  He was previously admitted with DKA and pneumonia in 12/19 with a similar presentation. Intubated 1/20- 2/4. CXR 1/30 revealed: Bilateral LOWER lobe consolidation involving the majority of the RIGHT LOWER lobe and much of the LEFT LOWER lobe, likely representing pneumonia. Mild bibasilar atelectasis. Mild bilateral ground-glass pulmonary opacities which are nonspecific but may represent mild edema. No pleural effusion or pneumothorax.      SLP Plan  Continue with current plan  of care;MBS       Recommendations  Diet recommendations: Honey-thick liquid;Dysphagia 2 (fine chop) Liquids provided via: Cup;Straw Medication Administration: Whole meds with puree Supervision: Patient able to self feed;Full supervision/cueing for compensatory strategies Compensations: Slow rate;Small sips/bites;Clear throat after each swallow Postural Changes and/or Swallow Maneuvers: Seated upright 90 degrees                Oral Care Recommendations: Oral care BID Follow up Recommendations: Skilled Nursing facility SLP Visit Diagnosis: Dysphagia, oropharyngeal phase (R13.12) Plan: Continue with current plan of care;MBS       GO                [name], SLP Student 04/22/2018, 9:45 AM

## 2018-04-22 NOTE — Progress Notes (Signed)
Inpatient Diabetes Program Recommendations  AACE/ADA: New Consensus Statement on Inpatient Glycemic Control (2015)  Target Ranges:  Prepandial:   less than 140 mg/dL      Peak postprandial:   less than 180 mg/dL (1-2 hours)      Critically ill patients:  140 - 180 mg/dL   Lab Results  Component Value Date   GLUCAP 321 (H) 04/22/2018   HGBA1C 13.3 (H) 02/27/2018    Review of Glycemic Control Results for BASSEM, Travis Bryan (MRN [id number]) as of 04/22/2018 11:26  Ref. Range 04/21/2018 09:05 04/21/2018 12:50 04/21/2018 16:56 04/21/2018 20:46 04/22/2018 08:44  Glucose-Capillary Latest Ref Range: 70 - 99 mg/dL 301 (H) 240 (H) 282 (H) 281 (H) 337 (H)   Diabetes history: DM 2 Outpatient Diabetes medications:  Lantus 25 units q HS, Novolog SSI Current orders for Inpatient glycemic control:  Lantus 12 units q HS, Novolog sensitive tid with meals  Inpatient Diabetes Program Recommendations:   Please consider increasing Lantus to 20 units q HS.   Thanks,  Adah Perl, RN, BC-ADM Inpatient Diabetes Coordinator Pager [phone] (8a-5p)

## 2018-04-23 LAB — CBC
HCT: 35.5 % — ABNORMAL LOW (ref 39.0–52.0)
HEMOGLOBIN: 10.9 g/dL — AB (ref 13.0–17.0)
MCH: 28.3 pg (ref 26.0–34.0)
MCHC: 30.7 g/dL (ref 30.0–36.0)
MCV: 92.2 fL (ref 80.0–100.0)
Platelets: 309 10*3/uL (ref 150–400)
RBC: 3.85 MIL/uL — ABNORMAL LOW (ref 4.22–5.81)
RDW: 15.8 % — ABNORMAL HIGH (ref 11.5–15.5)
WBC: 5.1 10*3/uL (ref 4.0–10.5)
nRBC: 0 % (ref 0.0–0.2)

## 2018-04-23 LAB — PROTIME-INR
INR: 1.41
Prothrombin Time: 17.1 seconds — ABNORMAL HIGH (ref 11.4–15.2)

## 2018-04-23 LAB — RENAL FUNCTION PANEL
Albumin: 2.4 g/dL — ABNORMAL LOW (ref 3.5–5.0)
Anion gap: 20 — ABNORMAL HIGH (ref 5–15)
BUN: 89 mg/dL — ABNORMAL HIGH (ref 6–20)
CHLORIDE: 96 mmol/L — AB (ref 98–111)
CO2: 17 mmol/L — ABNORMAL LOW (ref 22–32)
Calcium: 9.5 mg/dL (ref 8.9–10.3)
Creatinine, Ser: 15.07 mg/dL — ABNORMAL HIGH (ref 0.61–1.24)
GFR calc Af Amer: 4 mL/min — ABNORMAL LOW (ref >60)
GFR calc non Af Amer: 3 mL/min — ABNORMAL LOW (ref >60)
Glucose, Bld: 229 mg/dL — ABNORMAL HIGH (ref 70–99)
Phosphorus: 6.8 mg/dL — ABNORMAL HIGH (ref 2.5–4.6)
Potassium: 4 mmol/L (ref 3.5–5.1)
Sodium: 133 mmol/L — ABNORMAL LOW (ref 135–145)

## 2018-04-23 LAB — GLUCOSE, CAPILLARY
Glucose-Capillary: 210 mg/dL — ABNORMAL HIGH (ref 70–99)
Glucose-Capillary: 282 mg/dL — ABNORMAL HIGH (ref 70–99)
Glucose-Capillary: 259 mg/dL — ABNORMAL HIGH (ref 70–99)
Glucose-Capillary: 175 mg/dL — ABNORMAL HIGH (ref 70–99)
Glucose-Capillary: 293 mg/dL — ABNORMAL HIGH (ref 70–99)

## 2018-04-23 LAB — HEPARIN LEVEL (UNFRACTIONATED): Heparin Unfractionated: 0.35 IU/mL (ref 0.30–0.70)

## 2018-04-23 MED ORDER — NEPRO/CARBSTEADY PO LIQD
237.0000 mL | ORAL | Status: DC
  Administered 2018-04-23 – 2018-04-26 (×3): 237 mL via ORAL
  Filled 2018-04-23 (×4): qty 237

## 2018-04-23 MED ORDER — HEPARIN SODIUM (PORCINE) 1000 UNIT/ML IJ SOLN
INTRAMUSCULAR | Status: AC
  Administered 2018-04-23: 1000 [IU] via INTRAVENOUS_CENTRAL
  Filled 2018-04-23: qty 3

## 2018-04-23 NOTE — Progress Notes (Signed)
[facility]  Subjective  - No complaints.   Objective 137/87 73 98.6 F (37 C) (Oral) 18 97%  Intake/Output Summary (Last 24 hours) at 04/23/2018 0748 Last data filed at 04/23/2018 0659 Gross per 24 hour  Intake 312.23 ml  Output -  Net 312.23 ml    Palpable radial and brachial pulses bilaterally LIJ temporary catheter  Laboratory Lab Results: Recent Labs    04/21/18 0253 04/22/18 0553  WBC 5.8 5.4  HGB 11.8* 11.3*  HCT 37.7* 34.9*  PLT 308 312   BMET Recent Labs    04/22/18 0553 04/23/18 0703  NA 134* 133*  K 3.6 4.0  CL 97* 96*  CO2 18* 17*  GLUCOSE 325* 229*  BUN 79* 89*  CREATININE 12.68* 15.07*  CALCIUM 9.4 9.5    COAG Lab Results  Component Value Date   INR 1.20 04/22/2018   INR 1.10 04/21/2018   INR 1.17 04/20/2018   No results found for: PTT  Assessment/Planning:  Left arm AVF and exchange of temporary catheter re-scheduled for Thursday.  Patient was in radiology getting barium study yesterday when OR called for patient and surgery cancelled.  Please continue to hold coumadin.  Marty Heck 04/23/2018 7:48 AM --

## 2018-04-23 NOTE — H&P (Signed)
Physical Medicine and Rehabilitation Admission H&P    Chief Complaint  Patient presents with  .  Debility    HPI: Travis Bryan is a 48 year old right-handed male with history of NICM/AICD, chronic systolic congestive heart failure, atrial flutter-on Coumadin, CKD III-IV, HTN, recent hospitalization for pneumonia, who was admitted on 03/28/2018 with low-grade fevers, nausea/vomiting, progressive dyspnea and lethargy.  He was started on BiPAP as well as broad spectrum abx and diuretics.  Work-up revealed acute on chronic renal failure as well as acute hypoxemic respiratory failure due to Metapneumovirus pneumonia in setting of  acute on chronic CHF.  He continued to have worsening of renal status with poor urine output and CRRT initiated.  He developed PEA arrest on 01//20 requiring CPR x14 minutes with ACLS protocol.  He was intubated and has required pressors due to cardiogenic shock.   Hospital course significant for ARDS, HCAP versus aspiration pneumonia, coagulopathy requiring reversal of Coumadin with FFP, abdominal distention due to ileus and acute on chronic anemia.  He tolerated extubation on 2/4 and CRRT discontinued but patient continued to have minimal without urine output without  improvement in renal status.  Hemodialysis initiated and nephrology felt that patient had progressed to ESRD--has been clipped to [facility] PTS-12:25 chair.  Left AVF placed on 02/13  by Dr. Trula Slade. He continues to have runs of PAF and on amiodarone. Heparin resumed to bridge coumadin. He continues on oxygen due to issue with hypoxia, decreased endurance with debility. CIR recommended due to functional decline and patient felt to be medically optimized.       Review of Systems  Constitutional: Negative for chills and fever.  HENT: Negative for hearing loss and tinnitus.   Eyes: Negative for blurred vision and double vision.  Respiratory: Positive for shortness of breath. Negative for cough and  wheezing.   Cardiovascular: Negative for chest pain and palpitations.  Gastrointestinal: Negative for constipation, heartburn and nausea.  Musculoskeletal: Negative for back pain, myalgias and neck pain.       Buttock pain  Skin: Negative for itching and rash.  Neurological: Positive for weakness. Negative for dizziness, sensory change, speech change and headaches.  Psychiatric/Behavioral: The patient does not have insomnia.   All other systems reviewed and are negative.   Past Medical History:  Diagnosis Date  . AICD (automatic cardioverter/defibrillator) present   . Atrial flutter ([place])    a. s/p ablation 03/2016  . Chronic systolic CHF (congestive heart failure) ([place])   . HCAP (healthcare-associated pneumonia) 03/28/2018  . History of gout   . Hyperlipidemia   . Hypertension   . Myocardial infarction [facility])    "I think I had one a long long time ago" (03/28/2016)  . NICM (nonischemic cardiomyopathy) ([place])    a. LHC 6/06: pLAD 20, pLCx 20-30; b. Echo 5/15:  EF 15%, diffuse HK, restrictive physiology, trivial AI, trivial MR, mild LAE, moderate RVE, moderately reduced RVSF, moderate RAE, mild to moderate TR, PASP 43 mmHg  . Obesity   . Persistent atrial fibrillation   . Type II diabetes mellitus ([place])     Past Surgical History:  Procedure Laterality Date  . AV FISTULA PLACEMENT Left 04/25/2018   Procedure: ARTERIOVENOUS (AV) VS GRAFT ARM;  Surgeon: Serafina Mitchell, MD;  Location: [place];  Service: Vascular;  Laterality: Left;  . CARDIOVERSION N/A 04/07/2016   Procedure: CARDIOVERSION;  Surgeon: Thayer Headings, MD;  Location: [place];  Service: Cardiovascular;  Laterality: N/A;  . CARDIOVERSION N/A 04/11/2016  Procedure: CARDIOVERSION;  Surgeon: Larey Dresser, MD;  Location: [place];  Service: Cardiovascular;  Laterality: N/A;  . ELECTROPHYSIOLOGIC STUDY N/A 03/31/2016   Procedure: A-Flutter Ablation;  Surgeon: [place] Lance, MD;  Location: [place] CV LAB;  Service:  Cardiovascular;  Laterality: N/A;  . EXCHANGE OF A DIALYSIS CATHETER Left 04/25/2018   Procedure: Attempted Exchange Of A Dialysis Catheter on the Left side;  Surgeon: Serafina Mitchell, MD;  Location: [facility] OR;  Service: Vascular;  Laterality: Left;  . IMPLANTABLE CARDIOVERTER DEFIBRILLATOR (ICD) GENERATOR CHANGE N/A 08/27/2013   Procedure: ICD GENERATOR CHANGE;  Surgeon: [place] Lance, MD;  Location: [facility] CATH LAB;  Service: Cardiovascular;  Laterality: N/A;  . INSERTION OF DIALYSIS CATHETER Right 04/25/2018   Procedure: INSERTION OF TUNNELED DIALYSIS CATHETER;  Surgeon: Serafina Mitchell, MD;  Location: [facility] OR;  Service: Vascular;  Laterality: Right;  . TEE WITHOUT CARDIOVERSION N/A 03/31/2016   Procedure: TRANSESOPHAGEAL ECHOCARDIOGRAM (TEE);  Surgeon: Larey Dresser, MD;  Location[facility] ENDOSCOPY;  Service: Cardiovascular;  Laterality: N/A;    Family History  Problem Relation Age of Onset  . Hypertension Mother   . Heart disease Mother   . Diabetes Mother     Social History:  reports that he quit smoking about 15 years ago. His smoking use included cigars. He quit after 10.00 years of use. He has never used smokeless tobacco. He reports that he does not drink alcohol or use drugs.    Allergies  Allergen Reactions  . No Known Allergies     Medications Prior to Admission  Medication Sig Dispense Refill  . amiodarone (PACERONE) 200 MG tablet Take 1 tablet (200 mg total) by mouth daily. 30 tablet 5  . amLODipine (NORVASC) 5 MG tablet Take 1 tablet (5 mg total) by mouth daily. 30 tablet 6  . carvedilol (COREG) 25 MG tablet TAKE 1 TABLET BY MOUTH TWICE A DAY WITH A MEAL. (Patient taking differently: Take 25 mg by mouth 2 (two) times daily with a meal. ) 60 tablet 3  . colchicine 0.6 MG tablet Take 1 tablet (0.6 mg total) by mouth daily. 30 tablet 0  . hydrALAZINE (APRESOLINE) 100 MG tablet TAKE 1 TABLET BY MOUTH 3 TIMES DAILY. (Patient taking differently: Take 100 mg by mouth 3 (three) times  daily. ) 90 tablet 5  . insulin aspart (NOVOLOG) 100 UNIT/ML injection Substitute to any brand approved.Before each meal 3 times a day, 140-199 - 2 units, 200-250 - 4 units, 251-299 - 6 units,  300-349 - 8 units,  350 or above 10 units. Dispense syringes and needles as needed, Ok to switch to PEN if approved. DX DM2, Code E11.65 (Patient taking differently: Inject 10 Units into the skin See admin instructions. Substitute to any brand approved.Before each meal 3 times a day, 140-199 - 2 units, 200-250 - 4 units, 251-299 - 6 units,  300-349 - 8 units,  350 or above 10 units. Dispense syringes and needles as needed, Ok to switch to PEN if approved. DX DM2, Code E11.65) 1 vial 0  . insulin glargine (LANTUS) 100 UNIT/ML injection Inject 0.25 mLs (25 Units total) into the skin 2 (two) times daily. Dispense insulin pen if approved, if not dispense as needed syringes and needles for 1 month supply. Can switch to Levemir. Diagnosis E 11.65. (Patient taking differently: Inject 25 Units into the skin at bedtime. ) 10 mL 0  . potassium chloride SA (K-DUR,KLOR-CON) 20 MEQ tablet TAKE 2 TABLETS BY MOUTH  IN THE MORNING AND 1 TABLET IN THE EVENING (Patient taking differently: Take 40 mEq by mouth See admin instructions. TAKE 40 mg  IN THE MORNING AND 20 mg TABLET IN THE EVENING) 90 tablet 5  . ranolazine (RANEXA) 500 MG 12 hr tablet TAKE 1 TABLET (500 MG TOTAL) BY MOUTH 2 (TWO) TIMES DAILY. 60 tablet 5  . torsemide (DEMADEX) 20 MG tablet Take 3 tablets (60 mg total) by mouth daily. 90 tablet 5  . warfarin (COUMADIN) 5 MG tablet Take 1 tablet (5 mg total) by mouth daily at 6 PM. Take As Directed by [facility]madin Clinic (Patient taking differently: Take 5-7.5 mg by mouth daily at 6 PM. Take 7.5 mg on Mon. Wed. Fri Take 5 mg tues, thurs, sat. sunday)    . allopurinol (ZYLOPRIM) 300 MG tablet Take 300 mg by mouth daily.     Marland Kitchen amoxicillin-clavulanate (AUGMENTIN) 875-125 MG tablet Take 1 tablet by mouth every 12 (twelve) hours.  (Patient not taking: Reported on 03/28/2018) 20 tablet 0  . atorvastatin (LIPITOR) 40 MG tablet Take 1 tablet (40 mg total) by mouth daily. (Patient not taking: Reported on 03/28/2018) 90 tablet 3  . blood glucose meter kit and supplies KIT Dispense based on patient and insurance preference. Use up to four times daily as directed. (FOR ICD-9 250.00, 250.01). For QAC - HS accuchecks. 1 each 1  . calcitRIOL (ROCALTROL) 0.25 MCG capsule Take 0.25 mcg by mouth daily.    . diclofenac sodium (VOLTAREN) 1 % GEL Apply 2 g topically 4 (four) times daily. 1 Tube 0  . Insulin Syringe-Needle U-100 25G X 1" 1 ML MISC For 4 times a day insulin SQ, 1 month supply. Diagnosis E11.65 30 each 0  . Insulin Syringe-Needle U-100 31G X 15/64" 0.3 ML MISC Use to inject insulin 4 times a day 200 each 12  . isosorbide mononitrate (IMDUR) 30 MG 24 hr tablet TAKE 3 TABLETS (90 MG TOTAL) BY MOUTH DAILY. 270 tablet 3  . magnesium oxide (MAG-OX) 400 (241.3 Mg) MG tablet Take 1 tablet (400 mg total) by mouth daily. 30 tablet 6  . metolazone (ZAROXOLYN) 2.5 MG tablet TAKE 1 TABLET BY MOUTH AS NEEDED AS DIRECTED (Patient taking differently: Take 2.5 mg by mouth as needed (fluid). ) 5 tablet 0    Drug Regimen Review  Drug regimen was reviewed and remains appropriate with no significant issues identified  Home: Home Living Family/patient expects to be discharged to:: Private residence Living Arrangements: Other relatives Type of Home: House Additional Comments: Patient vauge with responses for home set up and PLOF. Pt reporting he lives with to aunts.   Functional History: Prior Function Level of Independence: Independent Comments: Pt reporting he was independent and was looking for a job.   Functional Status:  Mobility: Bed Mobility Overal bed mobility: Needs Assistance Bed Mobility: Rolling, Sidelying to Sit Rolling: Supervision Sidelying to sit: Supervision Supine to sit: Min guard Sit to supine: Min guard General  bed mobility comments: OOB in chair  Transfers Overall transfer level: Needs assistance Equipment used: Rolling walker (2 wheeled) Transfers: Sit to/from Stand Sit to Stand: Min guard Stand pivot transfers: Min assist General transfer comment: min guard for safety, cues for hand placement  Ambulation/Gait Ambulation/Gait assistance: Min guard Gait Distance (Feet): 100 Feet Assistive device: None Gait Pattern/deviations: Step-through pattern, Drifts right/left, Wide base of support General Gait Details: attempted gait without assistive device today, able to gait train approximately 164f without assistive device but does demonstrate wide  BOS; VSS during gait on 2LPM O2  Gait velocity: decreased    ADL: ADL Overall ADL's : Needs assistance/impaired Eating/Feeding: Supervision/ safety, Set up, Sitting Eating/Feeding Details (indicate cue type and reason): mod verbal cues to slow down, chew and swallow before taking next bite of food Grooming: Min guard, Cueing for sequencing, Cueing for safety, Wash/dry hands, Wash/dry face, Standing Grooming Details (indicate cue type and reason): Pt sitting at EOB with Min Guard A to perform oral care. Noting undershooting as pt would reach for swab or cup. Pt denies double vision. Pt requiring cues for sequencing.  Upper Body Bathing: Min guard, Sitting Lower Body Bathing: Minimal assistance, +2 for safety/equipment, Sit to/from stand Upper Body Dressing : Min guard, Sitting Lower Body Dressing: Moderate assistance, Sit to/from stand Lower Body Dressing Details (indicate cue type and reason): Mod A due to poor coorindation and vision for donning socks Toilet Transfer: Minimal assistance, Stand-pivot, Cueing for sequencing, Cueing for safety Toilet Transfer Details (indicate cue type and reason): simulated with transfer from eob to recliner. cues for hand placement on arm rests Toileting- Clothing Manipulation and Hygiene: Minimal assistance, Sit  to/from stand Toileting - Clothing Manipulation Details (indicate cue type and reason): simulated during transfer Functional mobility during ADLs: Minimal assistance, Cueing for sequencing, Cueing for safety General ADL Comments: notable delay with verbal response and physical initiation of tasks. multiple verbal and tactile stimulus required for task intitiation and completion.    Cognition: Cognition Overall Cognitive Status: Impaired/Different from baseline Orientation Level: Oriented X4 Cognition Arousal/Alertness: Awake/alert Behavior During Therapy: Flat affect Overall Cognitive Status: Impaired/Different from baseline Area of Impairment: Safety/judgement, Awareness, Attention Current Attention Level: Sustained Memory: Decreased short-term memory Following Commands: Follows one step commands with increased time, Follows multi-step commands consistently, Follows multi-step commands with increased time Safety/Judgement: Decreased awareness of safety Awareness: Emergent Problem Solving: Slow processing, Decreased initiation, Requires verbal cues General Comments: good awareness of lines and leads, followed cues well today    Blood pressure 104/72, pulse 84, temperature 98.3 F (36.8 C), temperature source Axillary, resp. rate 18, height 5' 10" (1.778 m), weight 99.2 kg, SpO2 94 %. Physical Exam  Nursing note and vitals reviewed. Constitutional: He is oriented to person, place, and time. He appears well-developed. He has a sickly appearance. Nasal cannula in place.  Facial edema noted.  Obese  HENT:  Head: Atraumatic.  Right Ear: External ear normal.  Left Ear: External ear normal.  Eyes: EOM are normal. Right eye exhibits no discharge. Left eye exhibits no discharge.  Neck: Normal range of motion. Neck supple.  Cardiovascular: Normal rate and regular rhythm.  Respiratory: Effort normal. He has decreased breath sounds in the right lower field and the left lower field.  +[place]    GI: Soft. Bowel sounds are normal.  Musculoskeletal:        General: No tenderness or edema.  Neurological: He is alert and oriented to person, place, and time.  Delayed processing. Able to follow basic commands.  Motor: Grossly 4+/5 throughout  Skin: Skin is warm and dry.  Psychiatric: His affect is blunt. He is agitated and slowed. Cognition and memory are impaired. He expresses inappropriate judgment. He exhibits abnormal remote memory.    Results for orders placed or performed during the hospital encounter of 03/28/18 (from the past 48 hour(s))  Glucose, capillary     Status: Abnormal   Collection Time: 04/24/18  3:25 PM  Result Value Ref Range   Glucose-Capillary 203 (H) 70 - 99  mg/dL  Glucose, capillary     Status: Abnormal   Collection Time: 04/24/18  9:23 PM  Result Value Ref Range   Glucose-Capillary 215 (H) 70 - 99 mg/dL  Protime-INR     Status: None   Collection Time: 04/25/18  3:06 AM  Result Value Ref Range   Prothrombin Time 14.0 11.4 - 15.2 seconds    Comment: QUESTIONABLE RESULTS, RECOMMEND RECOLLECT TO VERIFY   INR 1.09     Comment: QUESTIONABLE RESULTS, RECOMMEND RECOLLECT TO VERIFY Performed at [facility] [address], [place], [place] [address]   CBC     Status: Abnormal   Collection Time: 04/25/18  3:06 AM  Result Value Ref Range   WBC 8.4 4.0 - 10.5 K/uL    Comment: QUESTIONABLE RESULTS, RECOMMEND RECOLLECT TO VERIFY   RBC 3.27 (L) 4.22 - 5.81 MIL/uL    Comment: QUESTIONABLE RESULTS, RECOMMEND RECOLLECT TO VERIFY   Hemoglobin 8.7 (L) 13.0 - 17.0 g/dL    Comment: QUESTIONABLE RESULTS, RECOMMEND RECOLLECT TO VERIFY   HCT 29.3 (L) 39.0 - 52.0 %    Comment: QUESTIONABLE RESULTS, RECOMMEND RECOLLECT TO VERIFY   MCV 89.6 80.0 - 100.0 fL    Comment: QUESTIONABLE RESULTS, RECOMMEND RECOLLECT TO VERIFY   MCH 26.6 26.0 - 34.0 pg    Comment: QUESTIONABLE RESULTS, RECOMMEND RECOLLECT TO VERIFY   MCHC 29.7 (L) 30.0 - 36.0 g/dL    Comment:  QUESTIONABLE RESULTS, RECOMMEND RECOLLECT TO VERIFY   RDW 15.5 11.5 - 15.5 %    Comment: QUESTIONABLE RESULTS, RECOMMEND RECOLLECT TO VERIFY   Platelets 336 150 - 400 K/uL    Comment: QUESTIONABLE RESULTS, RECOMMEND RECOLLECT TO VERIFY   nRBC 0.0 0.0 - 0.2 %    Comment: QUESTIONABLE RESULTS, RECOMMEND RECOLLECT TO VERIFY Performed at [facility], [place] [address], [place], [place] [address]   Glucose, capillary     Status: Abnormal   Collection Time: 04/25/18  7:26 AM  Result Value Ref Range   Glucose-Capillary 179 (H) 70 - 99 mg/dL  CBC     Status: Abnormal   Collection Time: 04/25/18  7:39 AM  Result Value Ref Range   WBC 7.1 4.0 - 10.5 K/uL   RBC 3.81 (L) 4.22 - 5.81 MIL/uL   Hemoglobin 10.7 (L) 13.0 - 17.0 g/dL   HCT 34.8 (L) 39.0 - 52.0 %   MCV 91.3 80.0 - 100.0 fL   MCH 28.1 26.0 - 34.0 pg   MCHC 30.7 30.0 - 36.0 g/dL   RDW 15.5 11.5 - 15.5 %   Platelets 288 150 - 400 K/uL   nRBC 0.0 0.0 - 0.2 %    Comment: Performed at [facility], [place] [address], [place], [place] [address]  Heparin level (unfractionated)     Status: None   Collection Time: 04/25/18  7:39 AM  Result Value Ref Range   Heparin Unfractionated 0.43 0.30 - 0.70 IU/mL    Comment: (NOTE) If heparin results are below expected values, and patient dosage has  been confirmed, suggest follow up testing of antithrombin III levels. Performed at [facility], [place] [address], [place], [place] [address]   Protime-INR     Status: Abnormal   Collection Time: 04/25/18  7:39 AM  Result Value Ref Range   Prothrombin Time 16.1 (H) 11.4 - 15.2 seconds   INR 1.30     Comment: Performed at [facility] [address], [place], [place] [address]  Renal function panel     Status: Abnormal   Collection Time: 04/25/18  7:39 AM  Result Value Ref Range   Sodium 129 (L) 135 - 145 mmol/L   Potassium 4.6 3.5 - 5.1 mmol/L   Chloride 92 (L) 98 - 111 mmol/L   CO2 17 (L) 22 - 32 mmol/L   Glucose, Bld 186  (H) 70 - 99 mg/dL   BUN 64 (H) 6 - 20 mg/dL   Creatinine, Ser 13.49 (H) 0.61 - 1.24 mg/dL   Calcium 9.1 8.9 - 10.3 mg/dL   Phosphorus 5.8 (H) 2.5 - 4.6 mg/dL   Albumin 2.3 (L) 3.5 - 5.0 g/dL   GFR calc non Af Amer 4 (L) >60 mL/min   GFR calc Af Amer 4 (L) >60 mL/min   Anion gap 20 (H) 5 - 15    Comment: Performed at [facility], [address] [place], [place] [address]  Glucose, capillary     Status: Abnormal   Collection Time: 04/25/18 10:07 AM  Result Value Ref Range   Glucose-Capillary 192 (H) 70 - 99 mg/dL  Glucose, capillary     Status: Abnormal   Collection Time: 04/25/18  1:08 PM  Result Value Ref Range   Glucose-Capillary 149 (H) 70 - 99 mg/dL  Glucose, capillary     Status: Abnormal   Collection Time: 04/25/18  8:34 PM  Result Value Ref Range   Glucose-Capillary 102 (H) 70 - 99 mg/dL  Protime-INR     Status: Abnormal   Collection Time: 04/26/18  3:05 AM  Result Value Ref Range   Prothrombin Time 17.0 (H) 11.4 - 15.2 seconds   INR 1.40     Comment: Performed at [facility], [place] [address], [place], [place] [address]  Renal function panel     Status: Abnormal   Collection Time: 04/26/18  3:05 AM  Result Value Ref Range   Sodium 135 135 - 145 mmol/L   Potassium 3.9 3.5 - 5.1 mmol/L   Chloride 100 98 - 111 mmol/L   CO2 25 22 - 32 mmol/L   Glucose, Bld 117 (H) 70 - 99 mg/dL   BUN 26 (H) 6 - 20 mg/dL   Creatinine, Ser 7.58 (H) 0.61 - 1.24 mg/dL    Comment: DELTA CHECK NOTED   Calcium 8.6 (L) 8.9 - 10.3 mg/dL   Phosphorus 4.9 (H) 2.5 - 4.6 mg/dL   Albumin 2.1 (L) 3.5 - 5.0 g/dL   GFR calc non Af Amer 8 (L) >60 mL/min   GFR calc Af Amer 9 (L) >60 mL/min   Anion gap 10 5 - 15    Comment: Performed at [facility] [address] [place], [place] [address]  CBC     Status: Abnormal   Collection Time: 04/26/18  3:05 AM  Result Value Ref Range   WBC 7.3 4.0 - 10.5 K/uL   RBC 3.43 (L) 4.22 - 5.81 MIL/uL   Hemoglobin 9.8 (L) 13.0 - 17.0 g/dL   HCT  31.9 (L) 39.0 - 52.0 %   MCV 93.0 80.0 - 100.0 fL   MCH 28.6 26.0 - 34.0 pg   MCHC 30.7 30.0 - 36.0 g/dL   RDW 15.8 (H) 11.5 - 15.5 %   Platelets 272 150 - 400 K/uL   nRBC 0.0 0.0 - 0.2 %    Comment: Performed at [facility], [place] [address] [place], [place] [address]  Glucose, capillary     Status:  Abnormal   Collection Time: 04/26/18  7:25 AM  Result Value Ref Range   Glucose-Capillary 114 (H) 70 - 99 mg/dL  Glucose, capillary     Status: Abnormal   Collection Time: 04/26/18 11:43 AM  Result Value Ref Range   Glucose-Capillary 210 (H) 70 - 99 mg/dL   Dg Chest Port 1v Same Day  Result Date: 04/25/2018 CLINICAL DATA:  Status post catheter placement. EXAM: PORTABLE CHEST 1 VIEW COMPARISON:  Radiograph of April 18, 2018. FINDINGS: Stable cardiomegaly. Left-sided pacemaker is unchanged in position. Interval placement of right internal jugular dialysis catheter with distal tip in expected position of right atrium. No pneumothorax is noted. Lungs are unremarkable. Bony thorax is unremarkable. IMPRESSION: Interval placement of right internal jugular dialysis catheter with distal tip in expected position of right atrium. No pneumothorax is noted. Electronically Signed   By: Marijo Conception, M.D.   On: 04/25/2018 15:25   Dg Fluoro Guide Cv Line-no Report  Result Date: 04/25/2018 Fluoroscopy was utilized by the requesting physician.  No radiographic interpretation.       Medical Problem List and Plan: 1.  Deficits with mobility, endurance, and self-care secondary to debility.  Admit to CIR 2.  A fib/DVT Prophylaxis/Anticoagulation: Pharmaceutical: Coumadin with Heparin bridge 3. Pain Management: Hydrocodone prn for LUE pain.  4. Mood: LCSW to follow for evaluation and support.  5. Neuropsych: This patient is not fully capable of making decisions on his own behalf. 6. Skin/Wound Care: Routine pressure relief measures.  7. Fluids/Electrolytes/Nutrition: Strict I/O. Check weights  daily. Will consult dietician to educate patient on renal diet.  8. AKI on CKD: Likely ESRD- continue HD TTS. On phosphate binders and 1200 cc FR. Schedule HD at the end of the day to help with tolerance of therapy. .  9. H/o Afib/A flutter: Continues to have bouts of PAF. HR controlled on amiodarone. No BB due to soft BP.  10. Anemia of chronic disease: Monitored serially with HD, particularly given Heparin bridge.  11. T2DM: Monitor BS ac/hs. Continue Lantus 22 units at bedtime. Will add meal coverage for tighter control.  12.  Decompensated heart failure: Managed with HD--hyponatremia resolved.  Decrease FR to 1200cc. Monitor daily weights. Wean oxygen as able.  13. Low protein malnutrition: ON nephro. Will add prostat also. 52. Metavirus PNA/HCAP: Has completed antibiotic regimen. Encourage pulmonary hygiene. Wean supplemental oxygen as tolerated--has been using prn in the month PTA.    Bary Leriche, PA-C 04/26/2018

## 2018-04-23 NOTE — Progress Notes (Signed)
Physical Therapy Treatment Patient Details Name: Travis Bryan MRN: [id number] DOB: 1971/01/27 Today's Date: 04/23/2018    History of Present Illness 48 y.o. male admitted with pneumonia and decompensated heart failure. PEA arrest on 1/20, intubated 1/20-2/04.  PMH includes but not limited to: HTN, HLD, CKD III, A fib on coumadin, AICD placement, MI, DM2.     PT Comments    Patient received in bed, very flat affect but appropriate and polite with PT, willing to participate in PT session today. Cognition appears to be improving as patient demonstrated good awareness of lines and directed PT on how he would like to be helped with transfers, however did require ongoing VC for safety and continues to require extended time to initiate mobility/follow cues. Able to complete bed mobility with min guard, functional transfers with RW and MinA, and able to progress gait to approximately 15f with RW and min guard today. Noted much improved activity tolerance and awareness of dynamic obstacles in environment, however did require VC for navigation in hallway. SpO2 remained between 90-93% on 2LPM with activity. He was left in bed per RN request due to likely HD session this morning, all needs otherwise met.     Follow Up Recommendations  CIR     Equipment Recommendations  Other (comment)(defer to next venue )    Recommendations for Other Services       Precautions / Restrictions Precautions Precautions: Fall Precaution Comments: watch O2 Restrictions Weight Bearing Restrictions: No    Mobility  Bed Mobility Overal bed mobility: Needs Assistance Bed Mobility: Supine to Sit;Sit to Supine     Supine to sit: Min guard Sit to supine: Min guard   General bed mobility comments: min guard for safety, extended time to complete transfers   Transfers Overall transfer level: Needs assistance Equipment used: Rolling walker (2 wheeled) Transfers: Sit to/from Stand Sit to Stand: Min assist         General transfer comment: MinA to power up from bed, VC for hand placement and wide BOS upon standing   Ambulation/Gait Ambulation/Gait assistance: Min guard Gait Distance (Feet): 150 Feet Assistive device: Rolling walker (2 wheeled) Gait Pattern/deviations: Step-through pattern;Drifts right/left;Wide base of support Gait velocity: decreased   General Gait Details: able to progress gait distance with less fatigue, balance is improving and patient required only VC for navigation in hallway and finding his room; SpO2 remained 90-93% on 2LPM with gait    Stairs             Wheelchair Mobility    Modified Rankin (Stroke Patients Only)       Balance Overall balance assessment: Needs assistance Sitting-balance support: No upper extremity supported;Feet supported Sitting balance-Leahy Scale: Fair     Standing balance support: During functional activity;Bilateral upper extremity supported Standing balance-Leahy Scale: Fair Standing balance comment: heavy UE support on RW                             Cognition Arousal/Alertness: Awake/alert Behavior During Therapy: Flat affect Overall Cognitive Status: Impaired/Different from baseline Area of Impairment: Attention;Following commands;Memory;Safety/judgement;Awareness;Problem solving                   Current Attention Level: Sustained Memory: Decreased short-term memory Following Commands: Follows one step commands consistently;Follows one step commands with increased time Safety/Judgement: Decreased awareness of safety Awareness: Emergent Problem Solving: Slow processing;Decreased initiation;Requires verbal cues General Comments: ongoing flat affect, improved processing but continues to  require extended time for cue following       Exercises      General Comments        Pertinent Vitals/Pain Pain Assessment: No/denies pain Faces Pain Scale: No hurt    Home Living                       Prior Function            PT Goals (current goals can now be found in the care plan section) Acute Rehab PT Goals Patient Stated Goal: non stated PT Goal Formulation: With patient Time For Goal Achievement: 05/01/18 Potential to Achieve Goals: Good Progress towards PT goals: Progressing toward goals    Frequency    Min 3X/week      PT Plan Current plan remains appropriate    Co-evaluation              AM-PAC PT "6 Clicks" Mobility   Outcome Measure  Help needed turning from your back to your side while in a flat bed without using bedrails?: None Help needed moving from lying on your back to sitting on the side of a flat bed without using bedrails?: A Little Help needed moving to and from a bed to a chair (including a wheelchair)?: A Little Help needed standing up from a chair using your arms (e.g., wheelchair or bedside chair)?: A Little Help needed to walk in hospital room?: A Little Help needed climbing 3-5 steps with a railing? : A Lot 6 Click Score: 18    End of Session Equipment Utilized During Treatment: Gait belt Activity Tolerance: Patient tolerated treatment well Patient left: in bed;with call bell/phone within reach   PT Visit Diagnosis: Unsteadiness on feet (R26.81)     Time: 4196-2229 PT Time Calculation (min) (ACUTE ONLY): 17 min  Charges:  $Gait Training: 8-22 mins                     Deniece Ree PT, DPT, CBIS  Supplemental Physical Therapist [place]    Pager [phone] Acute Rehab Office [phone]

## 2018-04-23 NOTE — Progress Notes (Signed)
PROGRESS NOTE    Travis Bryan  VEH:[id number] DOB: May 12, 1970 DOA: 03/28/2018 PCP: Carroll Sage, MD   Brief Narrative:  48 year old with past medical history relevant for hypertension, stage IV CKD, nonischemic chronic systolic CHF (EF of 25 to 30%) status post AICD, paroxysmal atrial fibrillation status post DC cardioversion and ablation, type 2 diabetes on insulin, hypertension, hyperlipidemia, gout admitted on 03/28/2018 with chills and nausea and vomiting.  Hospital course was complicated by acute hypoxic respiratory failure and subsequent CODE BLUE on 04/01/2018 due to likely human metapneumovirus pneumonia with superimposed bacterial H CAP/ARDS as well as need for CRRT now likely ESRD.   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  Currently patient is felt to be likely dialysis dependent/ESRD. -Pending likely left AV fistula placement by vascular surgery on 04/25/2018 - Nephrology following, appreciate recommendations -Pending outpatient dialysis bed -Continue phosphate binder  #) PEA arrest due to human metapneumovirus pneumonia/H CAP/ards: Currently patient is completed a course of antibiotics and is simply on 2 to 4 L nasal cannula for respiratory support.  Suspect patient will likely need this long-term outpatient.  #) Paroxysmal atrial fibrillation status post cardioversion and ablation: Patient continues to  have runs of atrial fibrillation -Continue amiodarone 200 mg daily - Continue heparin drip -Plan to start warfarin after patient has AV fistula placement by vascular surgery  #) Nonischemic cardiomyopathy status post ICD: Volume status is predominantly being managed with dialysis now as patient is relatively anuric. - Will consider starting beta-blocker once patient's blood pressure can tolerate  #) Type 2 diabetes on insulin: -Continue sliding scale insulin -Continue glargine 20 units nightly  #) Hyperlipidemia: -Continue statin  #) Gout: -Continue allopurinol  Fluids: Restrict Electrolytes: Monitor and supplement Nutrition: Diabetic/renal diet   Prophylaxis: Heparin drip   Disposition: Pending dialysis bed  Full code  Consultants:   Nephrology  Cardiology  Pulmonary critical care  Vascular surgery   Procedures:   CRRT discontinued 04/13/2018 - 04/16/2018   04/01/2018 HD catheter insertion  04/01/2018 intubbation -   04/11/2018 TDC placed   Antimicrobials:   IV Zosyn completed course   Subjective: This morning the patient does not have any complaints.  He reports he feels quite tired and exhausted.  He denies any nausea, vomiting, diarrhea, cough, congestion, rhinorrhea.  Objective: Vitals:   04/23/18 0225 04/23/18 0316 04/23/18 0726 04/23/18 0826  BP:  137/87    Pulse:  73  84  Resp:  18  (!) 25  Temp:  98.6 F (37 C)  98.2 F (36.8 C)  TempSrc:  Oral  Oral  SpO2: 96% 95% 97% 97%  Weight:      Height:        Intake/Output Summary (Last 24 hours) at 04/23/2018 1031 Last data filed at 04/23/2018 0659 Gross per 24 hour  Intake 312.23 ml  Output -  Net 312.23 ml   Filed Weights   04/20/18 0725  04/20/18 1133 04/21/18 0645  Weight: 99.4 kg 98.5 kg 98.6 kg    Examination:  General exam: Appears calm and comfortable  Respiratory system: No increased work of breathing, diminished lung sounds at bases, scattered rhonchi Cardiovascular system:  Irregularly irregular, no murmurs Gastrointestinal system: Soft, nondistended, no rebound or guarding, plus bowel sounds. Central nervous system: Alert and oriented.  Grossly intact, moving all extremities Extremities: Trace lower extremity edema. Skin: TDC site is clean dry and intact Psychiatry: Judgement and insight appear normal. Mood & affect appropriate.     Data Reviewed: I have personally reviewed following labs and imaging studies  CBC: Recent Labs  Lab 04/19/18 0322 04/20/18 0304 04/21/18 0253 04/22/18 0553 04/23/18 0703  WBC 8.5 7.2 5.8 5.4 5.1  HGB 12.8* 11.7* 11.8* 11.3* 10.9*  HCT 39.7 37.8* 37.7* 34.9* 35.5*  MCV 91.9 91.7 90.6 91.1 92.2  PLT 324 338 308 312 767   Basic Metabolic Panel: Recent Labs  Lab 04/18/18 0753  04/20/18 1634 04/21/18 0253 04/21/18 1652 04/22/18 0553 04/23/18 0703  NA  --    < > 132* 134* 133* 134* 133*  K  --    < > 3.3* 3.5 4.8 3.6 4.0  CL  --    < > 97* 96* 98 97* 96*  CO2  --    < > 20* 20* 17* 18* 17*  GLUCOSE  --    < > 294* 308* 275* 325* 229*  BUN  --    < > 51* 60* 71* 79* 89*  CREATININE  --    < > 8.05* 9.63* 11.03* 12.68* 15.07*  CALCIUM  --    < > 8.8* 9.2 9.2 9.4 9.5  MG 3.6*  --   --   --   --   --   --   PHOS  --    < > 3.3 5.0* 4.4 5.6* 6.8*   < > = values in this interval not displayed.   GFR: Estimated Creatinine Clearance: 7.1 mL/min (A) (by C-G formula based on SCr of 15.07 mg/dL (H)). Liver Function Tests: Recent Labs  Lab 04/16/18 1224  04/20/18 1634 04/21/18 0253 04/21/18 1652 04/22/18 0553 04/23/18 0703  AST 42*  --   --   --   --   --   --   ALT 27  --   --   --   --   --   --   ALKPHOS 197*  --   --   --   --   --   --   BILITOT 1.4*  --   --   --   --   --   --   PROT 10.8*  --   --   --   --   --   --   ALBUMIN 2.8*   < > 2.3* 2.3* 2.4* 2.3* 2.4*   < > = values in this interval not displayed.   No results for input(s): LIPASE, AMYLASE in the last 168 hours. No results for input(s):  AMMONIA in the last 168 hours. Coagulation Profile: Recent Labs  Lab 04/19/18 0322 04/20/18 0304 04/21/18 0253 04/22/18 0553 04/23/18 0703  INR 1.21 1.17 1.10 1.20 1.41   Cardiac Enzymes: No results for input(s): CKTOTAL, CKMB, CKMBINDEX, TROPONINI in the last 168 hours. BNP (last 3 results) No results for input(s): PROBNP in the last 8760 hours. HbA1C: No results for input(s): HGBA1C in the last 72 hours. CBG: Recent Labs  Lab 04/22/18 1123  04/22/18 1704 04/22/18 1956 04/23/18 0313 04/23/18 0833  GLUCAP 321* 250* 205* 210* 282*   Lipid Profile: No results for input(s): CHOL, HDL, LDLCALC, TRIG, CHOLHDL, LDLDIRECT in the last 72 hours. Thyroid Function Tests: No results for input(s): TSH, T4TOTAL, FREET4, T3FREE, THYROIDAB in the last 72 hours. Anemia Panel: No results for input(s): VITAMINB12, FOLATE, FERRITIN, TIBC, IRON, RETICCTPCT in the last 72 hours. Sepsis Labs: No results for input(s): PROCALCITON, LATICACIDVEN in the last 168 hours.  No results found for this or any previous visit (from the past 240 hour(s)).       Radiology Studies: Dg Swallowing Func-speech Pathology  Result Date: 04/22/2018 Objective Swallowing Evaluation: Type of Study: MBS-Modified Barium Swallow Study Completed by Ellwood Dense, SLP student Supervised in line of sight and reviewed by Herbie Baltimore MA CCC-SLP  Patient Details Name: Geo Slone MRN: [id number] Date of Birth: 1970/03/22 Today's Date: 04/22/2018 Time: SLP Start Time (ACUTE ONLY): 0920 -SLP Stop Time (ACUTE ONLY): 0930 SLP Time Calculation (min) (ACUTE ONLY): 10 min Past Medical History: Past Medical History: Diagnosis Date . AICD (automatic cardioverter/defibrillator) present  . Atrial flutter ([place])   a. s/p ablation 03/2016 . Chronic systolic CHF (congestive heart failure) ([place])  . HCAP (healthcare-associated pneumonia) 03/28/2018 . History of gout  . Hyperlipidemia  . Hypertension  . Myocardial infarction [facility])   "I think I  had one a long long time ago" (03/28/2016) . NICM (nonischemic cardiomyopathy) ([place])   a. LHC 6/06: pLAD 20, pLCx 20-30; b. Echo 5/15:  EF 15%, diffuse HK, restrictive physiology, trivial AI, trivial MR, mild LAE, moderate RVE, moderately reduced RVSF, moderate RAE, mild to moderate TR, PASP 43 mmHg . Obesity  . Persistent atrial fibrillation  . Type II diabetes mellitus ([place])  Past Surgical History: Past Surgical History: Procedure Laterality Date . CARDIOVERSION N/A 04/07/2016  Procedure: CARDIOVERSION;  Surgeon: Thayer Headings, MD;  Location: [facility] ENDOSCOPY;  Service: Cardiovascular;  Laterality: N/A; . CARDIOVERSION N/A 04/11/2016  Procedure: CARDIOVERSION;  Surgeon: Larey Dresser, MD;  Location: [place];  Service: Cardiovascular;  Laterality: N/A; . ELECTROPHYSIOLOGIC STUDY N/A 03/31/2016  Procedure: A-Flutter Ablation;  Surgeon: Evans Lance, MD;  Location: [place] CV LAB;  Service: Cardiovascular;  Laterality: N/A; . IMPLANTABLE CARDIOVERTER DEFIBRILLATOR (ICD) GENERATOR CHANGE N/A 08/27/2013  Procedure: ICD GENERATOR CHANGE;  Surgeon: Evans Lance, MD;  Location: [facility] CATH LAB;  Service: Cardiovascular;  Laterality: N/A; . TEE WITHOUT CARDIOVERSION N/A 03/31/2016  Procedure: TRANSESOPHAGEAL ECHOCARDIOGRAM (TEE);  Surgeon: Larey Dresser, MD;  Location[facility] ENDOSCOPY;  Service: Cardiovascular;  Laterality: N/A; HPI: Pt is a 48 year old male with HTN, cardiomyopathy with AICD, atrial fibrillation, CHF and diabetes presenting with a one day history of chills and vomiting. Pt admitted 03/29/18 for acute hypoxemic respiratory failure secondary to CHF exacerbation and pneumonia. Suffered PEA arrest on 1/20 after episode of emesis and likely aspiration.  He was previously admitted with DKA and pneumonia in 12/19 with a similar presentation. Intubated 1/20- 2/4. CXR 1/30 revealed: Bilateral LOWER lobe consolidation involving the majority of the RIGHT LOWER lobe and much of the LEFT LOWER lobe, likely  representing pneumonia. Mild bibasilar atelectasis. Mild bilateral ground-glass pulmonary opacities which are nonspecific but may represent mild edema. No pleural effusion or pneumothorax.  Subjective: Pt alert and cooperative. Assessment / Plan / Recommendation CHL IP CLINICAL IMPRESSIONS 04/22/2018 Clinical Impression Pt demonstrates a functional swallow c/b adequate laryngeal sensation, laryngeal closure and swallow initiation. He tolerated all liquid consistencies with no signs of  aspiration. Flash penetration x2 occured, which pt immediately detected as demonstrated by independent throat clearing. Strength of laryngeal closure and throat clear ejected liquid trials completely. Consumption of mechanical soft and regular textured solids c/b adequate mastication and swallow initation. Given improvements will initiate regular, thin diet. Will f/u for tolerance. SLP Visit Diagnosis Dysphagia, oropharyngeal phase (R13.12) Attention and concentration deficit following -- Frontal lobe and executive function deficit following -- Impact on safety and function --   CHL IP TREATMENT RECOMMENDATION 04/22/2018 Treatment Recommendations Therapy as outlined in treatment plan below   No flowsheet data found. CHL IP DIET RECOMMENDATION 04/22/2018 SLP Diet Recommendations Regular solids;Thin liquid Liquid Administration via Cup;Straw Medication Administration Whole meds with liquid Compensations -- Postural Changes Seated upright at 90 degrees   CHL IP OTHER RECOMMENDATIONS 04/22/2018 Recommended Consults -- Oral Care Recommendations Oral care BID Other Recommendations --   CHL IP FOLLOW UP RECOMMENDATIONS 04/22/2018 Follow up Recommendations Skilled Nursing facility   [facility] IP FREQUENCY AND DURATION 04/22/2018 Speech Therapy Frequency (ACUTE ONLY) min 2x/week Treatment Duration 2 weeks      CHL IP ORAL PHASE 04/22/2018 Oral Phase WFL Oral - Pudding Teaspoon -- Oral - Pudding Cup -- Oral - Honey Teaspoon -- Oral - Honey Cup WFL Oral -  Nectar Teaspoon NT Oral - Nectar Cup WFL Oral - Nectar Straw NT Oral - Thin Teaspoon -- Oral - Thin Cup -- Oral - Thin Straw -- Oral - Puree NT Oral - Mech Soft WFL Oral - Regular WFL Oral - Multi-Consistency -- Oral - Pill WFL Oral Phase - Comment --  CHL IP PHARYNGEAL PHASE 04/22/2018 Pharyngeal Phase Impaired Pharyngeal- Pudding Teaspoon -- Pharyngeal -- Pharyngeal- Pudding Cup -- Pharyngeal -- Pharyngeal- Honey Teaspoon -- Pharyngeal -- Pharyngeal- Honey Cup [facility] Pharyngeal Material does not enter airway Pharyngeal- Nectar Teaspoon NT Pharyngeal -- Pharyngeal- Nectar Cup Penetration/Aspiration during swallow Pharyngeal Material enters airway, remains ABOVE vocal cords then ejected out Pharyngeal- Nectar Straw NT Pharyngeal -- Pharyngeal- Thin Teaspoon -- Pharyngeal -- Pharyngeal- Thin Cup WFL;Delayed swallow initiation-vallecula Pharyngeal -- Pharyngeal- Thin Straw WFL;Delayed swallow initiation-vallecula Pharyngeal -- Pharyngeal- Puree NT Pharyngeal -- Pharyngeal- Mechanical Soft WFL Pharyngeal -- Pharyngeal- Regular WFL Pharyngeal -- Pharyngeal- Multi-consistency -- Pharyngeal -- Pharyngeal- Pill WFL;Penetration/Aspiration during swallow Pharyngeal Material enters airway, remains ABOVE vocal cords then ejected out Pharyngeal Comment --  CHL IP CERVICAL ESOPHAGEAL PHASE 04/22/2018 Cervical Esophageal Phase WFL Pudding Teaspoon -- Pudding Cup -- Honey Teaspoon -- Honey Cup -- Nectar Teaspoon -- Nectar Cup -- Nectar Straw -- Thin Teaspoon -- Thin Cup -- Thin Straw -- Puree -- Mechanical Soft -- Regular -- Multi-consistency -- Pill -- Cervical Esophageal Comment -- Lynann Beaver 04/22/2018, 2:06 PM              Vas [place] Upper Extremity Arterial Duplex  Result Date: 04/21/2018 UPPER EXTREMITY DUPLEX STUDY Indications: Pre operative access. History:     ESRD.  Limitations: Irregular heart beat Comparison Study: No prior study Performing Technologist: Sharion Dove RVS  Examination Guidelines: A complete  evaluation includes B-mode imaging, spectral Doppler, color Doppler, and power Doppler as needed of all accessible portions of each vessel. Bilateral testing is considered an integral part of a complete examination. Limited examinations for reoccurring indications may be performed as noted.  Right Pre-Dialysis Findings: +-----------------------+----------+--------------------+---------+--------+ Location               PSV (cm/s)Intralum. Diam. (cm)Waveform Comments +-----------------------+----------+--------------------+---------+--------+ Brachial Antecub. fossa51        0.58  triphasic         +-----------------------+----------+--------------------+---------+--------+ Radial Art at Wrist    66        0.29                triphasic         +-----------------------+----------+--------------------+---------+--------+ Ulnar Art at Wrist     48        0.24                triphasic         +-----------------------+----------+--------------------+---------+--------+ Left Pre-Dialysis Findings: +-----------------------+----------+--------------------+---------+--------+ Location               PSV (cm/s)Intralum. Diam. (cm)Waveform Comments +-----------------------+----------+--------------------+---------+--------+ Brachial Antecub. fossa47        0.51                triphasic         +-----------------------+----------+--------------------+---------+--------+ Radial Art at Wrist    54        0.30                triphasic         +-----------------------+----------+--------------------+---------+--------+ Ulnar Art at Wrist     42        0.18                triphasic         +-----------------------+----------+--------------------+---------+--------+    Preliminary    Vas [place] Upper Ext Vein Mapping (pre-op Avf)  Result Date: 04/21/2018 UPPER EXTREMITY VEIN MAPPING  Indications: Pre-op dialysis access. Performing Technologist: Sharion Dove RVS  Examination  Guidelines: A complete evaluation includes B-mode imaging, spectral Doppler, color Doppler, and power Doppler as needed of all accessible portions of each vessel. Bilateral testing is considered an integral part of a complete examination. Limited examinations for reoccurring indications may be performed as noted. +-----------------+-------------+----------+---------+ Right Cephalic   Diameter (cm)Depth (cm)Findings  +-----------------+-------------+----------+---------+ Dist upper arm       0.52        0.62   branching +-----------------+-------------+----------+---------+ Antecubital fossa    0.47        0.70             +-----------------+-------------+----------+---------+ Prox forearm         0.44        0.72   branching +-----------------+-------------+----------+---------+ Mid forearm          0.46        0.47             +-----------------+-------------+----------+---------+ Wrist                0.51        0.30             +-----------------+-------------+----------+---------+ +-----------------+-------------+----------+---------+ Left Cephalic    Diameter (cm)Depth (cm)Findings  +-----------------+-------------+----------+---------+ Prox upper arm       0.30        0.40             +-----------------+-------------+----------+---------+ Mid upper arm        0.29        0.36             +-----------------+-------------+----------+---------+ Dist upper arm       0.39        0.38             +-----------------+-------------+----------+---------+ Antecubital fossa    0.43        0.32             +-----------------+-------------+----------+---------+  Prox forearm         0.38        0.43   branching +-----------------+-------------+----------+---------+ Mid forearm          0.36        0.23             +-----------------+-------------+----------+---------+ Wrist                0.34        0.49              +-----------------+-------------+----------+---------+ *See table(s) above for measurements and observations.  Diagnosing physician:    Preliminary         Scheduled Meds: . albuterol  2.5 mg Nebulization TID  . allopurinol  100 mg Oral Daily  . amiodarone  200 mg Oral Daily  . atorvastatin  40 mg Oral q1800  . Chlorhexidine Gluconate Cloth  6 each Topical Q0600  . insulin aspart  0-9 Units Subcutaneous TID WC  . insulin glargine  20 Units Subcutaneous QHS  . mouth rinse  15 mL Mouth Rinse BID  . nystatin  5 mL Oral QID  . pantoprazole  40 mg Oral Daily  . polyethylene glycol  17 g Oral Daily  . senna-docusate  1 tablet Oral BID  . sevelamer carbonate  800 mg Oral TID WC  . sodium chloride flush  10-40 mL Intracatheter Q12H  . Warfarin - Pharmacist Dosing Inpatient   Does not apply q1800   Continuous Infusions: . heparin 1,450 Units/hr (04/23/18 0659)     LOS: 26 days    Time spent: [place], MD [facility]  If 7PM-7AM, please contact night-coverage [email or web address] Password [facility] 04/23/2018, 10:31 AM

## 2018-04-23 NOTE — Progress Notes (Signed)
Nutrition Follow-up  INTERVENTION:   - Recommend renal MVI daily  - Continue Magic cup TID with meals, each supplement provides 290 kcal and 9 grams of protein  - Nepro Shake po daily, each supplement provides 425 kcal and 19 grams protein  NUTRITION DIAGNOSIS:   Inadequate oral intake related to inability to eat as evidenced by NPO status.  Progressing, pt now on Renal/Carb Mod diet  GOAL:   Patient will meet greater than or equal to 90% of their needs  Progressing  MONITOR:   PO intake, Supplement acceptance, Labs, I & O's, Weight trends, Skin  REASON FOR ASSESSMENT:   Ventilator, Consult Enteral/tube feeding initiation and management  ASSESSMENT:   48 yo male with PMH of CHF, NICM, HLD, HTN, obesity, AICD, DM-2, and A fib who was admitted with PNA with decompensated HF. S/P PEA arrest on 1/20, requiring CPR x 6 minutes and intubation.  2/4 - extubated, off CRRT 2/5 - diet advanced to D2/honey 2/10 - diet advanced to regular/thin  Noted plan for L arm AVF and dialysis catheter exchange for Thursday as pt is now likely ESRD. Therapies recommending CIR.  Weight continues to trend down since admission, total of 41 lbs. I/O's reviewed and pt is -5.9 L since admit.  Spoke with pt during HD treatment. Pt with questions regarding whether he will need HD permanently. Pt states, "no one will give me a clear answer."  RD encouraged adequate PO intake. Pt states that he is eating well at meals. Pt is willing to try Nepro oral nutrition supplement to maximize nutrition.  Net UF 2/8: 800 ml Post-HD weight 2/8: 98.5 kg  Meal Completion: 80-100% x last 6 recorded meals  Medications reviewed and include: SSI, Lantus 20 units daily, Protonix, Miralax, Senna, Renvela  Labs reviewed: sodium 130 (L), phosphorus 6.8 (H) CBG's: 259, 282, 210, 205, 250 x 24 hours   Diet Order:   Diet Order            Diet NPO time specified  Diet effective midnight        Diet renal/carb  modified with fluid restriction Diet-HS Snack? Nothing; Fluid restriction: 2000 mL Fluid; Room service appropriate? Yes; Fluid consistency: Thin  Diet effective now              EDUCATION NEEDS:   No education needs have been identified at this time  Skin:  Skin Assessment: (MASD partial thickness wounds to L buttocks & gluteal cleft)  Last BM:  2/9  Height:   Ht Readings from Last 1 Encounters:  04/11/18 5\' 10"  (1.778 m)    Weight:   Wt Readings from Last 1 Encounters:  04/21/18 98.6 kg    Ideal Body Weight:  75.5 kg  BMI:  Body mass index is 31.19 kg/m.  Estimated Nutritional Needs:   Kcal:  2050-2250  Protein:  105-120 gm  Fluid:  2-2.3 L    Gaynell Face, MS, RD, LDN Inpatient Clinical Dietitian Pager: [phone] Weekend/After Hours: [phone]

## 2018-04-23 NOTE — Progress Notes (Signed)
[facility]    NEPHROLOGY PROGRESS NOTE  SUBJECTIVE: Without acute complaints today.  Denies chest pain, shortness of breath, nausea, vomiting, diarrhea or dysuria.  Does complain of some ongoing fatigue.  All other review of systems are negative.  Vascular surgeon planning for a permacath and fistula Thurs (cancelled yesterday due to MBSS)     OBJECTIVE:  Vitals:   04/23/18 1430 04/23/18 1500  BP: 101/76 97/77  Pulse: 82 87  Resp:    Temp:    SpO2:      Intake/Output Summary (Last 24 hours) at 04/23/2018 1520 Last data filed at 04/23/2018 1500 Gross per 24 hour  Intake 428.43 ml  Output -  Net 428.43 ml      Genearl:  AAOx3 NAD HEENT: MMM [place] AT anicteric sclera Neck:  No JVD, no adenopathy left IJ temporary dialysis catheter CV:  Heart RRR  Lungs:  L/S CTA bilaterally Abd:  abd SNT/ND with normal BS GU:  Bladder non-palpable Extremities:  No LE edema. Skin:  No skin rash  MEDICATIONS:  . albuterol  2.5 mg Nebulization TID  . allopurinol  100 mg Oral Daily  . amiodarone  200 mg Oral Daily  . atorvastatin  40 mg Oral q1800  . Chlorhexidine Gluconate Cloth  6 each Topical Q0600  . feeding supplement (NEPRO CARB STEADY)  237 mL Oral Q24H  . insulin aspart  0-9 Units Subcutaneous TID WC  . insulin glargine  20 Units Subcutaneous QHS  . mouth rinse  15 mL Mouth Rinse BID  . nystatin  5 mL Oral QID  . pantoprazole  40 mg Oral Daily  . polyethylene glycol  17 g Oral Daily  . senna-docusate  1 tablet Oral BID  . sevelamer carbonate  800 mg Oral TID WC  . sodium chloride flush  10-40 mL Intracatheter Q12H  . Warfarin - Pharmacist Dosing Inpatient   Does not apply q1800       LABS:   CBC Latest Ref Rng & Units 04/23/2018 04/22/2018 04/21/2018  WBC 4.0 - 10.5 K/uL 5.1 5.4 5.8  Hemoglobin 13.0 - 17.0 g/dL 10.9(L) 11.3(L) 11.8(L)  Hematocrit 39.0 - 52.0 % 35.5(L) 34.9(L) 37.7(L)  Platelets 150 - 400 K/uL 309 312 308    CMP Latest Ref Rng & Units  04/23/2018 04/22/2018 04/21/2018  Glucose 70 - 99 mg/dL 229(H) 325(H) 275(H)  BUN 6 - 20 mg/dL 89(H) 79(H) 71(H)  Creatinine 0.61 - 1.24 mg/dL 15.07(H) 12.68(H) 11.03(H)  Sodium 135 - 145 mmol/L 133(L) 134(L) 133(L)  Potassium 3.5 - 5.1 mmol/L 4.0 3.6 4.8  Chloride 98 - 111 mmol/L 96(L) 97(L) 98  CO2 22 - 32 mmol/L 17(L) 18(L) 17(L)  Calcium 8.9 - 10.3 mg/dL 9.5 9.4 9.2  Total Protein 6.5 - 8.1 g/dL - - -  Total Bilirubin 0.3 - 1.2 mg/dL - - -  Alkaline Phos 38 - 126 U/L - - -  AST 15 - 41 U/L - - -  ALT 0 - 44 U/L - - -    Lab Results  Component Value Date   CALCIUM 9.5 04/23/2018   CAION 1.19 04/15/2018   PHOS 6.8 (H) 04/23/2018       Component Value Date/Time   COLORURINE YELLOW 03/28/2018 1040   APPEARANCEUR CLEAR 03/28/2018 1040   LABSPEC 1.015 03/28/2018 1040   PHURINE 7.0 03/28/2018 1040   GLUCOSEU 50 (A) 03/28/2018 [place] NEGATIVE 03/28/2018 [place] 03/28/2018 1040   KETONESUR NEGATIVE 03/28/2018 1040  PROTEINUR 100 (A) 03/28/2018 1040   UROBILINOGEN 0.2 12/18/2006 0428   NITRITE NEGATIVE 03/28/2018 1040   LEUKOCYTESUR NEGATIVE 03/28/2018 1040      Component Value Date/Time   PHART 7.330 (L) 04/15/2018 0435   PCO2ART 47.7 04/15/2018 0435   PO2ART 101.0 04/15/2018 0435   HCO3 25.1 04/15/2018 0435   TCO2 27 04/15/2018 0435   ACIDBASEDEF 1.0 04/15/2018 0435   O2SAT 97.0 04/15/2018 0435       Component Value Date/Time   IRON 56 12/18/2006 0736   TIBC 314 12/18/2006 0736   FERRITIN 65 12/18/2006 0736   IRONPCTSAT 18 (L) 12/18/2006 0736       ASSESSMENT/PLAN:     1.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 S/p CRRT- came off 2/4.Dialysis today. Will need to trend Cr- maybe not recoverable but remains to be seen.    Minimal urine output noted.  Plan for permacath and fistula Thursday.  2. Acute  hypoxic respiratory failure:With evidence of ARDS and preceding viral pneumonia/superimposed HCAP versus aspiration pneumonia-gotZosyn.extubated  3.Anemia:Hemoglobin 10.9. stable.  No further darbepoetin at this time.  4. History of atrial fibrillation/atrial flutter status post DCCV and ablation.He is currently rate controlled  5. Acute exacerbation of CHF with cardiogenic shock:per primary, appears resolved.  Maintain euvolemia with UF.   6.  Hyperphosphatemia.  We will continue phosphate binder.  If remains elevated with titrate.   7.  DM.  Per primary  8. Dispo: CLIP initiated.  No sign of renal recovery.  Will monitor urine output.    Jannifer Hick MD [facility] Pager [phone]

## 2018-04-23 NOTE — Progress Notes (Signed)
OT Cancellation Note  Patient Details Name: Travis Bryan MRN: [id number] DOB: 1970-10-14   Cancelled Treatment:    Reason Eval/Treat Not Completed: Patient at procedure or test/ unavailable.  Pt in HD.  Lucille Passy, OTR/L [place] Pager [phone] Office [phone]   Lucille Passy M 04/23/2018, 2:34 PM

## 2018-04-23 NOTE — Progress Notes (Signed)
[facility] for Heparin and Coumadin Indication: atrial fibrillation  Patient Measurements: Height: 5\' 10"  (177.8 cm) Weight: 217 lb 6 oz (98.6 kg) IBW/kg (Calculated) : 73  Heparin dosing weight: 98.6 kg  Vital Signs: Temp: 98.2 F (36.8 C) (02/11 0826) Temp Source: Oral (02/11 0826) BP: 137/87 (02/11 0316) Pulse Rate: 84 (02/11 0826)  Labs: Recent Labs    04/21/18 0253 04/21/18 1652 04/22/18 0553 04/22/18 2205 04/23/18 0703  HGB 11.8*  --  11.3*  --  10.9*  HCT 37.7*  --  34.9*  --  35.5*  PLT 308  --  312  --  309  LABPROT 14.1  --  15.1  --  17.1*  INR 1.10  --  1.20  --  1.41  HEPARINUNFRC 0.39  --  0.26* 0.24* 0.35  CREATININE 9.63* 11.03* 12.68*  --  15.07*    Assessment: 48 year old male presenting with NV and fever, h/o afib on Coumadin PTA.  Coumadin held for procedures, Vitamin K given 2/4.  Pharmacy consulted for heparin while off Coumadin. Coumadin resumed on 2/8, but now held for AVF vs graft.  Procedure planned for 2/10 but postponed until 2/13 due to barium study 2/10.     Heparin level is now low therapeutic (0.35) on 1450 units/hr.  INR trended up to 1.41.  Coumadin 7.5 mg given 2/8 and 2/9, though intent was to hold Coumadin on 2/9.     PTA Coumadin dose: 7.5mg  MWF, 5mg  all other days.  Goal of Therapy:  INR 2-3 Heparin level 0.3-0.7 units/ml Monitor platelets by anticoagulation protocol: Yes   Plan:   Continue heparin drip at 1450 units/hr  Heparin level, PT/INR and CBC daily.  Hold Coumadin again today.  Resume after procedure.  Arty Baumgartner, [place] Pager: [phone] or phone: [phone] 04/23/2018 10:00 AM

## 2018-04-24 LAB — HEPARIN LEVEL (UNFRACTIONATED): Heparin Unfractionated: 0.42 IU/mL (ref 0.30–0.70)

## 2018-04-24 LAB — HEPATITIS B SURFACE ANTIGEN: Hepatitis B Surface Ag: NEGATIVE

## 2018-04-24 LAB — RENAL FUNCTION PANEL
Albumin: 2.5 g/dL — ABNORMAL LOW (ref 3.5–5.0)
Anion gap: 12 (ref 5–15)
BUN: 53 mg/dL — ABNORMAL HIGH (ref 6–20)
CO2: 22 mmol/L (ref 22–32)
CREATININE: 11.34 mg/dL — AB (ref 0.61–1.24)
Calcium: 9 mg/dL (ref 8.9–10.3)
Chloride: 97 mmol/L — ABNORMAL LOW (ref 98–111)
GFR calc Af Amer: 5 mL/min — ABNORMAL LOW (ref >60)
GFR calc non Af Amer: 5 mL/min — ABNORMAL LOW (ref >60)
Glucose, Bld: 185 mg/dL — ABNORMAL HIGH (ref 70–99)
Phosphorus: 4.9 mg/dL — ABNORMAL HIGH (ref 2.5–4.6)
Potassium: 4 mmol/L (ref 3.5–5.1)
Sodium: 131 mmol/L — ABNORMAL LOW (ref 135–145)

## 2018-04-24 LAB — CBC
HCT: 34.2 % — ABNORMAL LOW (ref 39.0–52.0)
HEMOGLOBIN: 10.7 g/dL — AB (ref 13.0–17.0)
MCH: 28.8 pg (ref 26.0–34.0)
MCHC: 31.3 g/dL (ref 30.0–36.0)
MCV: 92.2 fL (ref 80.0–100.0)
NRBC: 0 % (ref 0.0–0.2)
Platelets: 302 10*3/uL (ref 150–400)
RBC: 3.71 MIL/uL — ABNORMAL LOW (ref 4.22–5.81)
RDW: 15.9 % — ABNORMAL HIGH (ref 11.5–15.5)
WBC: 6.1 10*3/uL (ref 4.0–10.5)

## 2018-04-24 LAB — PROTIME-INR
INR: 1.39
Prothrombin Time: 16.9 seconds — ABNORMAL HIGH (ref 11.4–15.2)

## 2018-04-24 LAB — GLUCOSE, CAPILLARY
Glucose-Capillary: 193 mg/dL — ABNORMAL HIGH (ref 70–99)
Glucose-Capillary: 277 mg/dL — ABNORMAL HIGH (ref 70–99)
Glucose-Capillary: 203 mg/dL — ABNORMAL HIGH (ref 70–99)
Glucose-Capillary: 215 mg/dL — ABNORMAL HIGH (ref 70–99)

## 2018-04-24 NOTE — H&P (View-Only) (Signed)
[facility]  Subjective  - No complaints.   Objective (!) 142/99 80 98.3 F (36.8 C) (Oral) (!) 25 97%  Intake/Output Summary (Last 24 hours) at 04/24/2018 1242 Last data filed at 04/24/2018 0600 Gross per 24 hour  Intake 275.7 ml  Output 1824 ml  Net -1548.3 ml    Palpable radial and brachial pulses BUE  Laboratory Lab Results: Recent Labs    04/23/18 0703 04/24/18 0638  WBC 5.1 6.1  HGB 10.9* 10.7*  HCT 35.5* 34.2*  PLT 309 302   BMET Recent Labs    04/23/18 0703 04/24/18 0638  NA 133* 131*  K 4.0 4.0  CL 96* 97*  CO2 17* 22  GLUCOSE 229* 185*  BUN 89* 53*  CREATININE 15.07* 11.34*  CALCIUM 9.5 9.0    COAG Lab Results  Component Value Date   INR 1.39 04/24/2018   INR 1.41 04/23/2018   INR 1.20 04/22/2018   No results found for: PTT  Assessment/Planning:  Plan for [facility] and left arm AVF tomorrow.  Please have patient NPO after midnight.  Call with questions or concerns.  Marty Heck 04/24/2018 12:42 PM --

## 2018-04-24 NOTE — Progress Notes (Signed)
PROGRESS NOTE    Travis Bryan  YKD:[id number] DOB: 1970/10/09 DOA: 03/28/2018 PCP: Carroll Sage, MD   Brief Narrative:  48 year old with past medical history relevant for hypertension, stage IV CKD, nonischemic chronic systolic CHF (EF of 25 to 30%) status post AICD, paroxysmal atrial fibrillation status post DC cardioversion and ablation, type 2 diabetes on insulin, hypertension, hyperlipidemia, gout admitted on 03/28/2018 with chills and nausea and vomiting.  Hospital course was complicated by acute hypoxic respiratory failure and subsequent CODE BLUE on 04/01/2018 due to likely human metapneumovirus pneumonia with superimposed bacterial H CAP/ARDS as well as need for CRRT now likely ESRD.   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  Currently patient is felt to be likely dialysis dependent/ESRD. -Pending likely left AV fistula placement by vascular surgery on 04/25/2018 - Nephrology following, appreciate recommendations -Pending outpatient dialysis bed -Continue phosphate binder  #) PEA arrest due to human metapneumovirus pneumonia/H CAP/ards: Currently patient is completed a course of antibiotics and is simply on 2 to 4 L nasal cannula for respiratory support.  Suspect patient will likely need this long-term outpatient.  #) Paroxysmal atrial fibrillation status post cardioversion and ablation: Patient continues to  have runs of atrial fibrillation -Continue amiodarone 200 mg daily - Continue heparin drip -Plan to start warfarin after patient has AV fistula placement by vascular surgery  #) Nonischemic cardiomyopathy status post ICD: Volume status is predominantly being managed with dialysis now as patient is relatively anuric. - Will consider starting beta-blocker once patient's blood pressure can tolerate  #) Type 2 diabetes on insulin: -Continue sliding scale insulin -Continue glargine 20 units nightly  #) Hyperlipidemia: -Continue statin  #) Gout: -Continue allopurinol  Fluids: Restrict Electrolytes: Monitor and supplement Nutrition: Diabetic/renal diet   Prophylaxis: Heparin drip   Disposition: Pending dialysis bed  Full code  Consultants:   Nephrology  Cardiology  Pulmonary critical care  Vascular surgery   Procedures:   CRRT discontinued 04/13/2018 - 04/16/2018   04/01/2018 HD catheter insertion  04/01/2018 intubbation -   04/11/2018 TDC placed   Antimicrobials:   IV Zosyn completed course   Subjective: This morning the patient reports that he is doing somewhat better.  He reports that after dialysis he did feel "wiped out".  He currently denies any chest pain, nausea, vomiting, diarrhea, cough, congestion, rhinorrhea.  Objective: Vitals:   04/23/18 2322 04/24/18 0317 04/24/18 0545 04/24/18 0700  BP:  (!) 134/96  106/77  Pulse:  72  80  Resp: (!) 23 (!) 21  15  Temp:  98.4 F (36.9 C)  98.3 F (36.8 C)  TempSrc:  Oral  Oral  SpO2: 94% 97%    Weight:   98.6 kg   Height:        Intake/Output Summary (Last 24 hours) at 04/24/2018 1032 Last data filed at 04/24/2018 0600 Gross per 24 hour  Intake 275.7 ml  Output 1824 ml  Net -1548.3 ml   Filed Weights   04/23/18 1245 04/23/18 1652 04/24/18 0545  Weight: 101 kg 98.3 kg 98.6 kg    Examination:  General exam: Appears calm and comfortable  Respiratory system: No increased work of breathing, diminished  lung sounds at bases, scattered rhonchi Cardiovascular system: Irregularly irregular, no murmurs Gastrointestinal system: Soft, nondistended, no rebound or guarding, plus bowel sounds. Central nervous system: Alert and oriented.  Grossly intact, moving all extremities Extremities: Trace lower extremity edema. Skin: TDC site is clean dry and intact Psychiatry: Judgement and insight appear normal. Mood & affect appropriate.     Data Reviewed: I have personally reviewed following labs and imaging studies  CBC: Recent Labs  Lab 04/20/18 0304 04/21/18 0253 04/22/18 0553 04/23/18 0703 04/24/18 0638  WBC 7.2 5.8 5.4 5.1 6.1  HGB 11.7* 11.8* 11.3* 10.9* 10.7*  HCT 37.8* 37.7* 34.9* 35.5* 34.2*  MCV 91.7 90.6 91.1 92.2 92.2  PLT 338 308 312 309 740   Basic Metabolic Panel: Recent Labs  Lab 04/18/18 0753  04/21/18 0253 04/21/18 1652 04/22/18 0553 04/23/18 0703 04/24/18 0638  NA  --    < > 134* 133* 134* 133* 131*  K  --    < > 3.5 4.8 3.6 4.0 4.0  CL  --    < > 96* 98 97* 96* 97*  CO2  --    < > 20* 17* 18* 17* 22  GLUCOSE  --    < > 308* 275* 325* 229* 185*  BUN  --    < > 60* 71* 79* 89* 53*  CREATININE  --    < > 9.63* 11.03* 12.68* 15.07* 11.34*  CALCIUM  --    < > 9.2 9.2 9.4 9.5 9.0  MG 3.6*  --   --   --   --   --   --   PHOS  --    < > 5.0* 4.4 5.6* 6.8* 4.9*   < > = values in this interval not displayed.   GFR: Estimated Creatinine Clearance: 9.5 mL/min (A) (by C-G formula based on SCr of 11.34 mg/dL (H)). Liver Function Tests: Recent Labs  Lab 04/21/18 0253 04/21/18 1652 04/22/18 0553 04/23/18 0703 04/24/18 8144  ALBUMIN 2.3* 2.4* 2.3* 2.4* 2.5*   No results for input(s): LIPASE, AMYLASE in the last 168 hours. No results for input(s): AMMONIA in the last 168 hours. Coagulation Profile: Recent Labs  Lab 04/20/18 0304 04/21/18 0253 04/22/18 0553 04/23/18 0703 04/24/18 0638  INR 1.17 1.10 1.20 1.41 1.39   Cardiac Enzymes: No results for input(s):  CKTOTAL, CKMB, CKMBINDEX, TROPONINI in the last 168 hours. BNP (last 3 results) No results for input(s): PROBNP in the last 8760 hours. HbA1C: No results for input(s): HGBA1C in the last 72 hours. CBG: Recent Labs  Lab 04/23/18 0833 04/23/18 1127 04/23/18 1725 04/23/18 2054 04/24/18 0842  GLUCAP 282* 259* 175* 293* 193*   Lipid Profile: No results for input(s): CHOL, HDL, LDLCALC, TRIG, CHOLHDL, LDLDIRECT in the last 72 hours. Thyroid Function Tests: No results for input(s): TSH, T4TOTAL, FREET4, T3FREE, THYROIDAB in the last 72 hours. Anemia Panel: No results for input(s): VITAMINB12, FOLATE, FERRITIN, TIBC, IRON, RETICCTPCT in the last 72 hours. Sepsis Labs: No results for input(s): PROCALCITON, LATICACIDVEN in the last 168 hours.  No results found for this or any previous visit (from the past 240 hour(s)).       Radiology Studies: Dg Swallowing Func-speech Pathology  Result Date: 04/22/2018 Objective  Swallowing Evaluation: Type of Study: MBS-Modified Barium Swallow Study Completed by Ellwood Dense, SLP student Supervised in line of sight and reviewed by Herbie [place] CCC-SLP  Patient Details Name: Layth Cerezo MRN: [id number] Date of Birth: 1971-02-15 Today's Date: 04/22/2018 Time: SLP Start Time (ACUTE ONLY): 7353 -SLP Stop Time (ACUTE ONLY): 0930 SLP Time Calculation (min) (ACUTE ONLY): 10 min Past Medical History: Past Medical History: Diagnosis Date . AICD (automatic cardioverter/defibrillator) present  . Atrial flutter ([place])   a. s/p ablation 03/2016 . Chronic systolic CHF (congestive heart failure) ([place])  . HCAP (healthcare-associated pneumonia) 03/28/2018 . History of gout  . Hyperlipidemia  . Hypertension  . Myocardial infarction [facility])   "I think I had one a long long time ago" (03/28/2016) . NICM (nonischemic cardiomyopathy) ([place])   a. LHC 6/06: pLAD 20, pLCx 20-30; b. Echo 5/15:  EF 15%, diffuse HK, restrictive physiology, trivial AI, trivial MR, mild LAE, moderate  RVE, moderately reduced RVSF, moderate RAE, mild to moderate TR, PASP 43 mmHg . Obesity  . Persistent atrial fibrillation  . Type II diabetes mellitus ([place])  Past Surgical History: Past Surgical History: Procedure Laterality Date . CARDIOVERSION N/A 04/07/2016  Procedure: CARDIOVERSION;  Surgeon: Thayer Headings, MD;  Location: [facility] ENDOSCOPY;  Service: Cardiovascular;  Laterality: N/A; . CARDIOVERSION N/A 04/11/2016  Procedure: CARDIOVERSION;  Surgeon: Larey Dresser, MD;  Location: [place];  Service: Cardiovascular;  Laterality: N/A; . ELECTROPHYSIOLOGIC STUDY N/A 03/31/2016  Procedure: A-Flutter Ablation;  Surgeon: Evans Lance, MD;  Location: [place] CV LAB;  Service: Cardiovascular;  Laterality: N/A; . IMPLANTABLE CARDIOVERTER DEFIBRILLATOR (ICD) GENERATOR CHANGE N/A 08/27/2013  Procedure: ICD GENERATOR CHANGE;  Surgeon: Evans Lance, MD;  Location: [facility] CATH LAB;  Service: Cardiovascular;  Laterality: N/A; . TEE WITHOUT CARDIOVERSION N/A 03/31/2016  Procedure: TRANSESOPHAGEAL ECHOCARDIOGRAM (TEE);  Surgeon: Larey Dresser, MD;  Location: [facility] ENDOSCOPY;  Service: Cardiovascular;  Laterality: N/A; HPI: Pt is a 48 year old male with HTN, cardiomyopathy with AICD, atrial fibrillation, CHF and diabetes presenting with a one day history of chills and vomiting. Pt admitted 03/29/18 for acute hypoxemic respiratory failure secondary to CHF exacerbation and pneumonia. Suffered PEA arrest on 1/20 after episode of emesis and likely aspiration.  He was previously admitted with DKA and pneumonia in 12/19 with a similar presentation. Intubated 1/20- 2/4. CXR 1/30 revealed: Bilateral LOWER lobe consolidation involving the majority of the RIGHT LOWER lobe and much of the LEFT LOWER lobe, likely representing pneumonia. Mild bibasilar atelectasis. Mild bilateral ground-glass pulmonary opacities which are nonspecific but may represent mild edema. No pleural effusion or pneumothorax.  Subjective: Pt alert and cooperative.  Assessment / Plan / Recommendation CHL IP CLINICAL IMPRESSIONS 04/22/2018 Clinical Impression Pt demonstrates a functional swallow c/b adequate laryngeal sensation, laryngeal closure and swallow initiation. He tolerated all liquid consistencies with no signs of aspiration. Flash penetration x2 occured, which pt immediately detected as demonstrated by independent throat clearing. Strength of laryngeal closure and throat clear ejected liquid trials completely. Consumption of mechanical soft and regular textured solids c/b adequate mastication and swallow initation. Given improvements will initiate regular, thin diet. Will f/u for tolerance. SLP Visit Diagnosis Dysphagia, oropharyngeal phase (R13.12) Attention and concentration deficit following -- Frontal lobe and executive function deficit following -- Impact on safety and function --   CHL IP TREATMENT RECOMMENDATION 04/22/2018 Treatment Recommendations Therapy as outlined in treatment plan below   No flowsheet data found. CHL IP DIET RECOMMENDATION 04/22/2018 SLP Diet Recommendations Regular solids;Thin liquid Liquid Administration  via Cup;Straw Medication Administration Whole meds with liquid Compensations -- Postural Changes Seated upright at 90 degrees   CHL IP OTHER RECOMMENDATIONS 04/22/2018 Recommended Consults -- Oral Care Recommendations Oral care BID Other Recommendations --   CHL IP FOLLOW UP RECOMMENDATIONS 04/22/2018 Follow up Recommendations Skilled Nursing facility   [facility] IP FREQUENCY AND DURATION 04/22/2018 Speech Therapy Frequency (ACUTE ONLY) min 2x/week Treatment Duration 2 weeks      CHL IP ORAL PHASE 04/22/2018 Oral Phase WFL Oral - Pudding Teaspoon -- Oral - Pudding Cup -- Oral - Honey Teaspoon -- Oral - Honey Cup WFL Oral - Nectar Teaspoon NT Oral - Nectar Cup WFL Oral - Nectar Straw NT Oral - Thin Teaspoon -- Oral - Thin Cup -- Oral - Thin Straw -- Oral - Puree NT Oral - Mech Soft WFL Oral - Regular WFL Oral - Multi-Consistency -- Oral - Pill WFL  Oral Phase - Comment --  CHL IP PHARYNGEAL PHASE 04/22/2018 Pharyngeal Phase Impaired Pharyngeal- Pudding Teaspoon -- Pharyngeal -- Pharyngeal- Pudding Cup -- Pharyngeal -- Pharyngeal- Honey Teaspoon -- Pharyngeal -- Pharyngeal- Honey Cup [facility] Pharyngeal Material does not enter airway Pharyngeal- Nectar Teaspoon NT Pharyngeal -- Pharyngeal- Nectar Cup Penetration/Aspiration during swallow Pharyngeal Material enters airway, remains ABOVE vocal cords then ejected out Pharyngeal- Nectar Straw NT Pharyngeal -- Pharyngeal- Thin Teaspoon -- Pharyngeal -- Pharyngeal- Thin Cup WFL;Delayed swallow initiation-vallecula Pharyngeal -- Pharyngeal- Thin Straw WFL;Delayed swallow initiation-vallecula Pharyngeal -- Pharyngeal- Puree NT Pharyngeal -- Pharyngeal- Mechanical Soft WFL Pharyngeal -- Pharyngeal- Regular WFL Pharyngeal -- Pharyngeal- Multi-consistency -- Pharyngeal -- Pharyngeal- Pill WFL;Penetration/Aspiration during swallow Pharyngeal Material enters airway, remains ABOVE vocal cords then ejected out Pharyngeal Comment --  CHL IP CERVICAL ESOPHAGEAL PHASE 04/22/2018 Cervical Esophageal Phase WFL Pudding Teaspoon -- Pudding Cup -- Honey Teaspoon -- Honey Cup -- Nectar Teaspoon -- Nectar Cup -- Nectar Straw -- Thin Teaspoon -- Thin Cup -- Thin Straw -- Puree -- Mechanical Soft -- Regular -- Multi-consistency -- Pill -- Cervical Esophageal Comment -- De[place], Katherene Ponto 04/22/2018, 2:06 PM                   Scheduled Meds: . albuterol  2.5 mg Nebulization TID  . allopurinol  100 mg Oral Daily  . amiodarone  200 mg Oral Daily  . atorvastatin  40 mg Oral q1800  . Chlorhexidine Gluconate Cloth  6 each Topical Q0600  . feeding supplement (NEPRO CARB STEADY)  237 mL Oral Q24H  . insulin aspart  0-9 Units Subcutaneous TID WC  . insulin glargine  20 Units Subcutaneous QHS  . mouth rinse  15 mL Mouth Rinse BID  . nystatin  5 mL Oral QID  . pantoprazole  40 mg Oral Daily  . polyethylene glycol  17 g Oral Daily    . senna-docusate  1 tablet Oral BID  . sevelamer carbonate  800 mg Oral TID WC  . sodium chloride flush  10-40 mL Intracatheter Q12H  . Warfarin - Pharmacist Dosing Inpatient   Does not apply q1800   Continuous Infusions: . heparin 1,450 Units/hr (04/23/18 1708)     LOS: 27 days    Time spent: [place], MD [facility]  If 7PM-7AM, please contact night-coverage [email or web address] Password [email or web address] 04/24/2018, 10:32 AM

## 2018-04-24 NOTE — Progress Notes (Signed)
  Speech Language Pathology Treatment: Dysphagia  Patient Details Name: Travis Bryan MRN: [id number] DOB: 04/05/1970 Today's Date: 04/24/2018 Time: 1050-1100 SLP Time Calculation (min) (ACUTE ONLY): 10 min  Assessment / Plan / Recommendation Clinical Impression  Pt demonstrates adequate tolerance of thin liquids without any ongoing signs of aspiration. Still mildly dysphonic but improved with a throat clear and increased breath support. Expect gradual improvement over the next few weeks. No SLP interventions needed acutely will sign off.   HPI HPI: Pt is a 48 year old male with HTN, cardiomyopathy with AICD, atrial fibrillation, CHF and diabetes presenting with a one day history of chills and vomiting. Pt admitted 03/29/18 for acute hypoxemic respiratory failure secondary to CHF exacerbation and pneumonia. Suffered PEA arrest on 1/20 after episode of emesis and likely aspiration.  He was previously admitted with DKA and pneumonia in 12/19 with a similar presentation. Intubated 1/20- 2/4. CXR 1/30 revealed: Bilateral LOWER lobe consolidation involving the majority of the RIGHT LOWER lobe and much of the LEFT LOWER lobe, likely representing pneumonia. Mild bibasilar atelectasis. Mild bilateral ground-glass pulmonary opacities which are nonspecific but may represent mild edema. No pleural effusion or pneumothorax.      SLP Plan  All goals met       Recommendations  Diet recommendations: Regular;Thin liquid Liquids provided via: Cup;Straw Medication Administration: Whole meds with liquid Supervision: Patient able to self feed Compensations: Slow rate;Small sips/bites Postural Changes and/or Swallow Maneuvers: Seated upright 90 degrees                Follow up Recommendations: None Plan: All goals met       GO                Dannielle Baskins, Katherene Ponto 04/24/2018, 11:02 AM

## 2018-04-24 NOTE — Progress Notes (Signed)
IP rehab admissions - Noted patient to have perm cath / fistula placed tomorrow.  Patient is doing well with therapies.  Will await medical readiness and then determine potential continued need for inpatient rehab stay.  Call me for questions.  [phone]

## 2018-04-24 NOTE — Progress Notes (Signed)
[facility]  Subjective  - No complaints.   Objective (!) 142/99 80 98.3 F (36.8 C) (Oral) (!) 25 97%  Intake/Output Summary (Last 24 hours) at 04/24/2018 1242 Last data filed at 04/24/2018 0600 Gross per 24 hour  Intake 275.7 ml  Output 1824 ml  Net -1548.3 ml    Palpable radial and brachial pulses BUE  Laboratory Lab Results: Recent Labs    04/23/18 0703 04/24/18 0638  WBC 5.1 6.1  HGB 10.9* 10.7*  HCT 35.5* 34.2*  PLT 309 302   BMET Recent Labs    04/23/18 0703 04/24/18 0638  NA 133* 131*  K 4.0 4.0  CL 96* 97*  CO2 17* 22  GLUCOSE 229* 185*  BUN 89* 53*  CREATININE 15.07* 11.34*  CALCIUM 9.5 9.0    COAG Lab Results  Component Value Date   INR 1.39 04/24/2018   INR 1.41 04/23/2018   INR 1.20 04/22/2018   No results found for: PTT  Assessment/Planning:  Plan for [facility] and left arm AVF tomorrow.  Please have patient NPO after midnight.  Call with questions or concerns.  Marty Heck 04/24/2018 12:42 PM --

## 2018-04-24 NOTE — Consult Note (Addendum)
[place] Nurse wound follow up Wound type:MDRPI stage 2 wounds on forehead from pulse oximeter. Measurement:only one wound still open 0.4cm x 0.2cm x 0.1cm Wound bed:red Drainage (amount, consistency, odor) none Periwound:intact Dressing procedure/placement/frequency:No dressing necessary at this time, healing well. [place] team will continue to follow this patient weekly. Re-consult if need arises prior to our next weekly visit. Fara Olden, RN-C, WTA-C, [place] Wound Treatment Associate Ostomy Care Associate

## 2018-04-24 NOTE — Progress Notes (Signed)
[facility]    NEPHROLOGY PROGRESS NOTE  SUBJECTIVE: Without acute complaints today.  Denies chest pain, shortness of breath, nausea, vomiting, diarrhea or dysuria.  Does complain of some ongoing fatigue.  All other review of systems are negative.  Vascular surgeon planning for a permacath and fistula tomorrow (cancelled Mon due to MBSS)     OBJECTIVE:  Vitals:   04/24/18 0700 04/24/18 1100  BP: 106/77 (!) 142/99  Pulse: 80   Resp: 15 (!) 25  Temp: 98.3 F (36.8 C) 98.3 F (36.8 C)  SpO2:      Intake/Output Summary (Last 24 hours) at 04/24/2018 1321 Last data filed at 04/24/2018 0600 Gross per 24 hour  Intake 275.7 ml  Output 1824 ml  Net -1548.3 ml      [place]:  AAOx3 NAD HEENT: MMM [place] AT anicteric sclera Neck:  No JVD, no adenopathy left IJ temporary dialysis catheter CV:  Heart RRR  Lungs:  L/S CTA bilaterally Abd:  abd SNT/ND with normal BS GU:  Bladder non-palpable Extremities:  No LE edema. Skin:  No skin rash  MEDICATIONS:  . albuterol  2.5 mg Nebulization TID  . allopurinol  100 mg Oral Daily  . amiodarone  200 mg Oral Daily  . atorvastatin  40 mg Oral q1800  . Chlorhexidine Gluconate Cloth  6 each Topical Q0600  . feeding supplement (NEPRO CARB STEADY)  237 mL Oral Q24H  . insulin aspart  0-9 Units Subcutaneous TID WC  . insulin glargine  20 Units Subcutaneous QHS  . mouth rinse  15 mL Mouth Rinse BID  . nystatin  5 mL Oral QID  . pantoprazole  40 mg Oral Daily  . polyethylene glycol  17 g Oral Daily  . senna-docusate  1 tablet Oral BID  . sevelamer carbonate  800 mg Oral TID WC  . sodium chloride flush  10-40 mL Intracatheter Q12H  . Warfarin - Pharmacist Dosing Inpatient   Does not apply q1800       LABS:   CBC Latest Ref Rng & Units 04/24/2018 04/23/2018 04/22/2018  WBC 4.0 - 10.5 K/uL 6.1 5.1 5.4  Hemoglobin 13.0 - 17.0 g/dL 10.7(L) 10.9(L) 11.3(L)  Hematocrit 39.0 - 52.0 % 34.2(L) 35.5(L) 34.9(L)  Platelets 150 - 400 K/uL  302 309 312    CMP Latest Ref Rng & Units 04/24/2018 04/23/2018 04/22/2018  Glucose 70 - 99 mg/dL 185(H) 229(H) 325(H)  BUN 6 - 20 mg/dL 53(H) 89(H) 79(H)  Creatinine 0.61 - 1.24 mg/dL 11.34(H) 15.07(H) 12.68(H)  Sodium 135 - 145 mmol/L 131(L) 133(L) 134(L)  Potassium 3.5 - 5.1 mmol/L 4.0 4.0 3.6  Chloride 98 - 111 mmol/L 97(L) 96(L) 97(L)  CO2 22 - 32 mmol/L 22 17(L) 18(L)  Calcium 8.9 - 10.3 mg/dL 9.0 9.5 9.4  Total Protein 6.5 - 8.1 g/dL - - -  Total Bilirubin 0.3 - 1.2 mg/dL - - -  Alkaline Phos 38 - 126 U/L - - -  AST 15 - 41 U/L - - -  ALT 0 - 44 U/L - - -    Lab Results  Component Value Date   CALCIUM 9.0 04/24/2018   CAION 1.19 04/15/2018   PHOS 4.9 (H) 04/24/2018       Component Value Date/Time   COLORURINE YELLOW 03/28/2018 1040   APPEARANCEUR CLEAR 03/28/2018 1040   LABSPEC 1.015 03/28/2018 1040   PHURINE 7.0 03/28/2018 1040   GLUCOSEU 50 (A) 03/28/2018 1040   HGBUR NEGATIVE 03/28/2018 1040   BILIRUBINUR  NEGATIVE 03/28/2018 1040   KETONESUR NEGATIVE 03/28/2018 1040   PROTEINUR 100 (A) 03/28/2018 1040   UROBILINOGEN 0.2 12/18/2006 0428   NITRITE NEGATIVE 03/28/2018 1040   LEUKOCYTESUR NEGATIVE 03/28/2018 1040      Component Value Date/Time   PHART 7.330 (L) 04/15/2018 0435   PCO2ART 47.7 04/15/2018 0435   PO2ART 101.0 04/15/2018 0435   HCO3 25.1 04/15/2018 0435   TCO2 27 04/15/2018 0435   ACIDBASEDEF 1.0 04/15/2018 0435   O2SAT 97.0 04/15/2018 0435       Component Value Date/Time   IRON 56 12/18/2006 0736   TIBC 314 12/18/2006 0736   FERRITIN 65 12/18/2006 0736   IRONPCTSAT 18 (L) 12/18/2006 0736       ASSESSMENT/PLAN:     1.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 S/p CRRT- came off 2/4.Dialysis yesterday. Minimal urine output noted.  Plan for permacath and fistula Thursday --> will plan to hold HD until Friday so  it doesn't interfere with OR.  2. Acute hypoxic respiratory failure:With evidence of ARDS and preceding viral pneumonia/superimposed HCAP versus aspiration pneumonia-s/p zosyn course.Stable on 2L [place].  3.Anemia:Hemoglobin 10.7. stable.  No further darbepoetin at this time.  4. History of atrial fibrillation/atrial flutter status post DCCV and ablation.He is currently rate controlled  5. Acute exacerbation of CHF with cardiogenic shock:per primary, appears resolved.  Maintain euvolemia with UF.   6.  Hyperphosphatemia.  We will continue phosphate binder. controlled  7.  DM.  Per primary  8. Dispo: CLIP initiated.  No sign of renal recovery.  Will monitor urine output - still none   Jannifer Hick MD [facility] Pager [phone]

## 2018-04-24 NOTE — Progress Notes (Signed)
[place] for Heparin and Coumadin Indication: atrial fibrillation  Patient Measurements: Height: 5\' 10"  (177.8 cm) Weight: 217 lb 6 oz (98.6 kg) IBW/kg (Calculated) : 73  Heparin dosing weight: 98.6 kg  Vital Signs: Temp: 98.3 F (36.8 C) (02/12 0700) Temp Source: Oral (02/12 0700) BP: 106/77 (02/12 0700) Pulse Rate: 80 (02/12 0700)  Labs: Recent Labs    04/22/18 0553 04/22/18 2205 04/23/18 0703 04/24/18 0638  HGB 11.3*  --  10.9* 10.7*  HCT 34.9*  --  35.5* 34.2*  PLT 312  --  309 302  LABPROT 15.1  --  17.1* 16.9*  INR 1.20  --  1.41 1.39  HEPARINUNFRC 0.26* 0.24* 0.35 0.42  CREATININE 12.68*  --  15.07* 11.34*    Assessment: 48 year old male presenting with NV and fever, h/o afib on Coumadin PTA.  Coumadin held for procedures, Vitamin K given 2/4.  Pharmacy consulted for heparin while off Coumadin. Coumadin resumed on 2/8, but now held for AVF vs graft.  Procedure planned for 2/10 but postponed until 2/13 due to barium study 2/10.     Heparin level remains therapeutic (0.42) on 1450 units/hr.  INR 1.39, no further trend up.  Coumadin 7.5 mg given 2/8 and 2/9, though intent was to hold Coumadin on 2/9.     PTA Coumadin dose: 7.5 mg MWF, 5 mg all other days.  Goal of Therapy:  INR 2-3 Heparin level 0.3-0.7 units/ml Monitor platelets by anticoagulation protocol: Yes   Plan:   Continue heparin drip at 1450 units/hr  Heparin level, PT/INR and CBC daily.  Hold Coumadin again today.  Resume after procedure.  Arty Baumgartner, [place] Pager: [phone] or phone: [phone] 04/24/2018 9:10 AM

## 2018-04-24 NOTE — Progress Notes (Signed)
Physical Therapy Treatment Patient Details Name: Travis Bryan MRN: [id number] DOB: 1970-05-09 Today's Date: 04/24/2018    History of Present Illness 48 y.o. male admitted with pneumonia and decompensated heart failure. PEA arrest on 1/20, intubated 1/20-2/04.  PMH includes but not limited to: HTN, HLD, CKD III, A fib on coumadin, AICD placement, MI, DM2.     PT Comments    Patient continues to demonstrate progress with therapy, with improved fluidity and awareness during gait today. Pt memory appears to be improving as he stated accurately which days his family had visited him this past week. Weary to walk without RW today but agreeable to plan to trial without AD next visit. VSS on RA during activity. Cont to rec CIR at this time.     Follow Up Recommendations  CIR     Equipment Recommendations  (TBD)    Recommendations for Other Services       Precautions / Restrictions Precautions Precautions: Fall Precaution Comments: watch O2 Restrictions Weight Bearing Restrictions: No    Mobility  Bed Mobility Overal bed mobility: Needs Assistance             General bed mobility comments: Pt sitting EOB at beginning of session  Transfers Overall transfer level: Needs assistance Equipment used: Rolling walker (2 wheeled) Transfers: Sit to/from Stand Sit to Stand: Min assist Stand pivot transfers: Min assist;+2 physical assistance       General transfer comment: MinA to power up from bed, VC for hand placement and wide BOS upon standing   Ambulation/Gait Ambulation/Gait assistance: Min guard Gait Distance (Feet): 100 Feet Assistive device: Rolling walker (2 wheeled) Gait Pattern/deviations: Step-through pattern;Drifts right/left;Wide base of support Gait velocity: decreased   General Gait Details: ambulating with RW, VSS, improving awareness and obstcale avoidance today. Feel we can trial without RW next session, pt agreeable   Stairs              Wheelchair Mobility    Modified Rankin (Stroke Patients Only)       Balance Overall balance assessment: Mild deficits observed, not formally tested                                          Cognition Arousal/Alertness: Awake/alert Behavior During Therapy: Flat affect Overall Cognitive Status: Impaired/Different from baseline Area of Impairment: Safety/judgement;Awareness;Attention                   Current Attention Level: Sustained Memory: Decreased short-term memory Following Commands: Follows one step commands with increased time;Follows multi-step commands consistently Safety/Judgement: Decreased awareness of safety Awareness: Emergent Problem Solving: Slow processing;Decreased initiation;Requires verbal cues General Comments: able to recall when his aunt was last here accurately       Exercises General Exercises - Upper Extremity Shoulder Flexion: AROM;Both;10 reps Shoulder Horizontal ABduction: AROM;Both;10 reps    General Comments General comments (skin integrity, edema, etc.): [name] HR spiked to 130 bpm during 3 consecutive standing level UE exericses HR returned to 80 bpm following several minute rest break      Pertinent Vitals/Pain Pain Assessment: No/denies pain    Home Living                      Prior Function            PT Goals (current goals can now be found in the care plan section) Acute  Rehab PT Goals Patient Stated Goal: non stated PT Goal Formulation: With patient Time For Goal Achievement: 05/01/18 Potential to Achieve Goals: Good Progress towards PT goals: Progressing toward goals    Frequency    Min 3X/week      PT Plan Current plan remains appropriate    Co-evaluation              AM-PAC PT "6 Clicks" Mobility   Outcome Measure  Help needed turning from your back to your side while in a flat bed without using bedrails?: None Help needed moving from lying on your back to sitting  on the side of a flat bed without using bedrails?: A Little Help needed moving to and from a bed to a chair (including a wheelchair)?: A Little Help needed standing up from a chair using your arms (e.g., wheelchair or bedside chair)?: A Little Help needed to walk in hospital room?: A Little Help needed climbing 3-5 steps with a railing? : A Lot 6 Click Score: 18    End of Session Equipment Utilized During Treatment: Gait belt Activity Tolerance: Patient tolerated treatment well Patient left: in bed;with call bell/phone within reach Nurse Communication: Mobility status PT Visit Diagnosis: Unsteadiness on feet (R26.81)     Time: 3976-7341 PT Time Calculation (min) (ACUTE ONLY): 23 min  Charges:  $Gait Training: 8-22 mins $Therapeutic Activity: 8-22 mins                    Reinaldo Berber, PT, DPT Acute Rehabilitation Services Pager: [phone] Office: [place] 04/24/2018, 4:11 PM

## 2018-04-24 NOTE — Progress Notes (Signed)
Occupational Therapy Treatment Patient Details Name: Travis Bryan MRN: [id number] DOB: 18-Dec-1970 Today's Date: 04/24/2018    History of present illness 48 y.o. male admitted with pneumonia and decompensated heart failure. PEA arrest on 1/20, intubated 1/20-2/04.  PMH includes but not limited to: HTN, HLD, CKD III, A fib on coumadin, AICD placement, MI, DM2.    OT comments  Pt progressing toward OT goals this session with improvements in static/dynamic standing balance, B UE strengthening and vital sign stability. Pt completed B UE strengthening/endurance circuit with 3L  on for entire session, no drop in SpO2. Pt remains appropriate for CIR at d/c.    Follow Up Recommendations  CIR;Supervision/Assistance - 24 hour    Equipment Recommendations  None recommended by OT    Recommendations for Other Services PT consult;Speech consult    Precautions / Restrictions Precautions Precautions: Fall Precaution Comments: watch O2 Restrictions Weight Bearing Restrictions: No       Mobility Bed Mobility               General bed mobility comments: Pt sitting EOB at beginning of session  Transfers                      Balance Overall balance assessment: Mild deficits observed, not formally tested                                         ADL either performed or assessed with clinical judgement    Cognition Arousal/Alertness: Awake/alert Behavior During Therapy: Flat affect Overall Cognitive Status: Impaired/Different from baseline Area of Impairment: Safety/judgement;Awareness;Attention                   Current Attention Level: Sustained Memory: Decreased short-term memory Following Commands: Follows one step commands with increased time;Follows multi-step commands consistently Safety/Judgement: Decreased awareness of safety Awareness: Emergent Problem Solving: Slow processing;Decreased initiation;Requires verbal cues General Comments:  ongoing flat affect, improved processing but continues to require extended time for cue following         Exercises Exercises: General Upper Extremity General Exercises - Upper Extremity Shoulder Flexion: AROM;Both;10 reps Shoulder Horizontal ABduction: AROM;Both;10 reps      General Comments [name] HR spiked to 130 bpm during 3 consecutive standing level UE exericses HR returned to 80 bpm following several minute rest break    Pertinent Vitals/ Pain       Pain Assessment: No/denies pain         Frequency  Min 3X/week        Progress Toward Goals  OT Goals(current goals can now be found in the care plan section)  Progress towards OT goals: Progressing toward goals  Acute Rehab OT Goals Patient Stated Goal: non stated OT Goal Formulation: With patient Time For Goal Achievement: 05/01/18 Potential to Achieve Goals: Good  Plan Discharge plan remains appropriate       AM-PAC OT "6 Clicks" Daily Activity     Outcome Measure   Help from another person eating meals?: A Little Help from another person taking care of personal grooming?: A Little Help from another person toileting, which includes using toliet, bedpan, or urinal?: A Little Help from another person bathing (including washing, rinsing, drying)?: A Little Help from another person to put on and taking off regular upper body clothing?: A Little Help from another person to put on and taking off regular lower  body clothing?: A Little 6 Click Score: 18    End of Session Equipment Utilized During Treatment: Gait belt;Rolling walker  OT Visit Diagnosis: Unsteadiness on feet (R26.81);Other abnormalities of gait and mobility (R26.89);Muscle weakness (generalized) (M62.81);Pain;Other symptoms and signs involving cognitive function   Activity Tolerance Patient tolerated treatment well   Patient Left with call bell/phone within reach;with chair alarm set;in bed   Nurse Communication Mobility status        Time:  4166-0630 OT Time Calculation (min): 18 min  Charges: OT General Charges $OT Visit: 1 Visit OT Treatments $Therapeutic Activity: 8-22 mins   Curtis Sites OTR/L  04/24/2018, 1:15 PM

## 2018-04-25 ENCOUNTER — Inpatient Hospital Stay (HOSPITAL_COMMUNITY): Payer: Medicaid Other | Admitting: Anesthesiology

## 2018-04-25 ENCOUNTER — Inpatient Hospital Stay (HOSPITAL_COMMUNITY): Payer: Medicaid Other

## 2018-04-25 ENCOUNTER — Encounter (HOSPITAL_COMMUNITY)
Admission: EM | Disposition: A | Discharge status: Rehabilitation Facility | Payer: Self-pay | Source: Home / Self Care | Attending: Pulmonary Disease

## 2018-04-25 HISTORY — PX: AV FISTULA PLACEMENT: SHX1204

## 2018-04-25 HISTORY — PX: EXCHANGE OF A DIALYSIS CATHETER: SHX5818

## 2018-04-25 HISTORY — PX: INSERTION OF DIALYSIS CATHETER: SHX1324

## 2018-04-25 LAB — GLUCOSE, CAPILLARY
Glucose-Capillary: 179 mg/dL — ABNORMAL HIGH (ref 70–99)
Glucose-Capillary: 192 mg/dL — ABNORMAL HIGH (ref 70–99)
Glucose-Capillary: 149 mg/dL — ABNORMAL HIGH (ref 70–99)
Glucose-Capillary: 102 mg/dL — ABNORMAL HIGH (ref 70–99)

## 2018-04-25 LAB — CBC
HCT: 34.8 % — ABNORMAL LOW (ref 39.0–52.0)
HEMATOCRIT: 29.3 % — AB (ref 39.0–52.0)
Hemoglobin: 8.7 g/dL — ABNORMAL LOW (ref 13.0–17.0)
Hemoglobin: 10.7 g/dL — ABNORMAL LOW (ref 13.0–17.0)
MCH: 26.6 pg (ref 26.0–34.0)
MCH: 28.1 pg (ref 26.0–34.0)
MCHC: 29.7 g/dL — ABNORMAL LOW (ref 30.0–36.0)
MCHC: 30.7 g/dL (ref 30.0–36.0)
MCV: 89.6 fL (ref 80.0–100.0)
MCV: 91.3 fL (ref 80.0–100.0)
Platelets: 336 10*3/uL (ref 150–400)
Platelets: 288 10*3/uL (ref 150–400)
RBC: 3.27 MIL/uL — ABNORMAL LOW (ref 4.22–5.81)
RBC: 3.81 MIL/uL — ABNORMAL LOW (ref 4.22–5.81)
RDW: 15.5 % (ref 11.5–15.5)
RDW: 15.5 % (ref 11.5–15.5)
WBC: 8.4 10*3/uL (ref 4.0–10.5)
WBC: 7.1 K/uL (ref 4.0–10.5)
nRBC: 0 % (ref 0.0–0.2)
nRBC: 0 % (ref 0.0–0.2)

## 2018-04-25 LAB — RENAL FUNCTION PANEL
Albumin: 2.3 g/dL — ABNORMAL LOW (ref 3.5–5.0)
Anion gap: 20 — ABNORMAL HIGH (ref 5–15)
BUN: 64 mg/dL — ABNORMAL HIGH (ref 6–20)
CO2: 17 mmol/L — ABNORMAL LOW (ref 22–32)
Calcium: 9.1 mg/dL (ref 8.9–10.3)
Chloride: 92 mmol/L — ABNORMAL LOW (ref 98–111)
Creatinine, Ser: 13.49 mg/dL — ABNORMAL HIGH (ref 0.61–1.24)
GFR calc Af Amer: 4 mL/min — ABNORMAL LOW (ref >60)
GFR calc non Af Amer: 4 mL/min — ABNORMAL LOW (ref >60)
Glucose, Bld: 186 mg/dL — ABNORMAL HIGH (ref 70–99)
Phosphorus: 5.8 mg/dL — ABNORMAL HIGH (ref 2.5–4.6)
Potassium: 4.6 mmol/L (ref 3.5–5.1)
Sodium: 129 mmol/L — ABNORMAL LOW (ref 135–145)

## 2018-04-25 LAB — HEPARIN LEVEL (UNFRACTIONATED): Heparin Unfractionated: 0.43 IU/mL (ref 0.30–0.70)

## 2018-04-25 LAB — PROTIME-INR
INR: 1.09
INR: 1.3
Prothrombin Time: 14 seconds (ref 11.4–15.2)
Prothrombin Time: 16.1 s — ABNORMAL HIGH (ref 11.4–15.2)

## 2018-04-25 SURGERY — INSERTION OF DIALYSIS CATHETER
Anesthesia: General | Site: Neck | Laterality: Right

## 2018-04-25 MED ORDER — LIDOCAINE-EPINEPHRINE (PF) 1 %-1:200000 IJ SOLN
INTRAMUSCULAR | Status: AC
  Filled 2018-04-25: qty 30

## 2018-04-25 MED ORDER — HEPARIN SODIUM (PORCINE) 1000 UNIT/ML IJ SOLN
INTRAMUSCULAR | Status: AC
  Filled 2018-04-25: qty 1

## 2018-04-25 MED ORDER — ONDANSETRON HCL 4 MG/2ML IJ SOLN
INTRAMUSCULAR | Status: DC | PRN
  Administered 2018-04-25: 4 mg via INTRAVENOUS

## 2018-04-25 MED ORDER — PROPOFOL 10 MG/ML IV BOLUS
INTRAVENOUS | Status: DC | PRN
  Administered 2018-04-25: 25 mg via INTRAVENOUS
  Administered 2018-04-25: 150 mg via INTRAVENOUS

## 2018-04-25 MED ORDER — OXYCODONE HCL 5 MG/5ML PO SOLN: 5.0000 mg | Freq: Once | ORAL | Status: DC | PRN

## 2018-04-25 MED ORDER — SODIUM CHLORIDE 0.9 % IV SOLN
INTRAVENOUS | Status: DC | PRN
  Administered 2018-04-25 (×2): via INTRAVENOUS

## 2018-04-25 MED ORDER — HEPARIN (PORCINE) 25000 UT/250ML-% IV SOLN
1450.0000 [IU]/h | INTRAVENOUS | Status: DC
  Administered 2018-04-26: 1450 [IU]/h via INTRAVENOUS
  Filled 2018-04-25: qty 250

## 2018-04-25 MED ORDER — SODIUM CHLORIDE 0.9 % IV SOLN
INTRAVENOUS | Status: DC | PRN
  Administered 2018-04-25: 10:00:00

## 2018-04-25 MED ORDER — CEFAZOLIN SODIUM-DEXTROSE 2-4 GM/100ML-% IV SOLN
INTRAVENOUS | Status: AC
  Filled 2018-04-25: qty 100

## 2018-04-25 MED ORDER — LIDOCAINE 2% (20 MG/ML) 5 ML SYRINGE
INTRAMUSCULAR | Status: DC | PRN
  Administered 2018-04-25: 40 mg via INTRAVENOUS

## 2018-04-25 MED ORDER — LIDOCAINE HCL (PF) 1 % IJ SOLN
INTRAMUSCULAR | Status: AC
  Filled 2018-04-25: qty 30

## 2018-04-25 MED ORDER — HYDROCODONE-ACETAMINOPHEN 5-325 MG PO TABS
1.0000 | ORAL_TABLET | Freq: Four times a day (QID) | ORAL | Status: DC | PRN
  Administered 2018-04-25: 2 via ORAL
  Filled 2018-04-25: qty 2

## 2018-04-25 MED ORDER — FENTANYL CITRATE (PF) 100 MCG/2ML IJ SOLN
INTRAMUSCULAR | Status: DC | PRN
  Administered 2018-04-25 (×2): 50 ug via INTRAVENOUS
  Administered 2018-04-25: 25 ug via INTRAVENOUS
  Administered 2018-04-25: 50 ug via INTRAVENOUS
  Administered 2018-04-25: 25 ug via INTRAVENOUS
  Administered 2018-04-25: 50 ug via INTRAVENOUS

## 2018-04-25 MED ORDER — OXYCODONE HCL 5 MG PO TABS: 5.0000 mg | ORAL_TABLET | Freq: Once | ORAL | Status: DC | PRN

## 2018-04-25 MED ORDER — FENTANYL CITRATE (PF) 100 MCG/2ML IJ SOLN
INTRAMUSCULAR | Status: AC
  Filled 2018-04-25: qty 2

## 2018-04-25 MED ORDER — ONDANSETRON HCL 4 MG/2ML IJ SOLN: 4.0000 mg | Freq: Four times a day (QID) | INTRAMUSCULAR | Status: DC | PRN

## 2018-04-25 MED ORDER — CHLORHEXIDINE GLUCONATE CLOTH 2 % EX PADS: 6.0000 | MEDICATED_PAD | Freq: Every day | CUTANEOUS | Status: DC

## 2018-04-25 MED ORDER — HEPARIN SODIUM (PORCINE) 1000 UNIT/ML IJ SOLN
INTRAMUSCULAR | Status: DC | PRN
  Administered 2018-04-25: 3400 [IU] via INTRA_ARTERIAL

## 2018-04-25 MED ORDER — CEFAZOLIN SODIUM-DEXTROSE 2-3 GM-%(50ML) IV SOLR
INTRAVENOUS | Status: DC | PRN
  Administered 2018-04-25: 2 g via INTRAVENOUS

## 2018-04-25 MED ORDER — SODIUM CHLORIDE 0.9 % IV SOLN
INTRAVENOUS | Status: AC
  Filled 2018-04-25: qty 1.2

## 2018-04-25 MED ORDER — SODIUM CHLORIDE 0.9 % IV SOLN
INTRAVENOUS | Status: DC | PRN
  Administered 2018-04-25: 50 ug/min via INTRAVENOUS

## 2018-04-25 MED ORDER — 0.9 % SODIUM CHLORIDE (POUR BTL) OPTIME
TOPICAL | Status: DC | PRN
  Administered 2018-04-25: 1000 mL

## 2018-04-25 MED ORDER — MIDAZOLAM HCL 2 MG/2ML IJ SOLN
INTRAMUSCULAR | Status: AC
  Filled 2018-04-25: qty 2

## 2018-04-25 MED ORDER — FENTANYL CITRATE (PF) 100 MCG/2ML IJ SOLN
25.0000 ug | INTRAMUSCULAR | Status: DC | PRN
  Administered 2018-04-25: 50 ug via INTRAVENOUS

## 2018-04-25 SURGICAL SUPPLY — 73 items
ADH SKN CLS APL DERMABOND .7 (GAUZE/BANDAGES/DRESSINGS) ×6
ADH SKN CLS LQ APL DERMABOND (GAUZE/BANDAGES/DRESSINGS) ×9
AGENT HMST SPONGE THK3/8 (HEMOSTASIS)
ARMBAND PINK RESTRICT EXTREMIT (MISCELLANEOUS) ×8 IMPLANT
BAG DECANTER FOR FLEXI CONT (MISCELLANEOUS) ×4 IMPLANT
BIOPATCH RED 1 DISK 7.0 (GAUZE/BANDAGES/DRESSINGS) ×4 IMPLANT
CANISTER SUCT 3000ML PPV (MISCELLANEOUS) ×4 IMPLANT
CATH BEACON 5 .035 65 KMP TIP (CATHETERS) ×1 IMPLANT
CATH PALINDROME RT-P 15FX19CM (CATHETERS) IMPLANT
CATH PALINDROME RT-P 15FX23CM (CATHETERS) ×1 IMPLANT
CATH PALINDROME RT-P 15FX28CM (CATHETERS) ×1 IMPLANT
CATH PALINDROME RT-P 15FX55CM (CATHETERS) IMPLANT
CATH STRAIGHT 5FR 65CM (CATHETERS) IMPLANT
CLIP VESOCCLUDE MED 6/CT (CLIP) ×4 IMPLANT
CLIP VESOCCLUDE SM WIDE 6/CT (CLIP) ×4 IMPLANT
COVER PROBE W GEL 5X96 (DRAPES) ×5 IMPLANT
COVER SURGICAL LIGHT HANDLE (MISCELLANEOUS) ×4 IMPLANT
COVER WAND RF STERILE (DRAPES) ×4 IMPLANT
DECANTER SPIKE VIAL GLASS SM (MISCELLANEOUS) ×4 IMPLANT
DERMABOND ADHESIVE PROPEN (GAUZE/BANDAGES/DRESSINGS) ×3
DERMABOND ADVANCED (GAUZE/BANDAGES/DRESSINGS) ×2
DERMABOND ADVANCED .7 DNX12 (GAUZE/BANDAGES/DRESSINGS) ×3 IMPLANT
DERMABOND ADVANCED .7 DNX6 (GAUZE/BANDAGES/DRESSINGS) IMPLANT
DEVICE TORQUE KENDALL .025-038 (MISCELLANEOUS) ×1 IMPLANT
DRAPE C-ARM 42X72 X-RAY (DRAPES) ×5 IMPLANT
DRAPE CHEST BREAST 15X10 FENES (DRAPES) ×4 IMPLANT
DRAPE ORTHO SPLIT 77X108 STRL (DRAPES) ×4
DRAPE SURG ORHT 6 SPLT 77X108 (DRAPES) IMPLANT
ELECT REM PT RETURN 9FT ADLT (ELECTROSURGICAL) ×4
ELECTRODE REM PT RTRN 9FT ADLT (ELECTROSURGICAL) ×3 IMPLANT
GAUZE 4X4 16PLY RFD (DISPOSABLE) ×4 IMPLANT
GAUZE SPONGE 4X4 12PLY STRL (GAUZE/BANDAGES/DRESSINGS) ×1 IMPLANT
GAUZE SPONGE 4X4 12PLY STRL LF (GAUZE/BANDAGES/DRESSINGS) ×1 IMPLANT
GLOVE BIO SURGEON STRL SZ7.5 (GLOVE) ×5 IMPLANT
GLOVE BIOGEL PI IND STRL 8 (GLOVE) ×3 IMPLANT
GLOVE BIOGEL PI INDICATOR 8 (GLOVE) ×2
GOWN STRL REUS W/ TWL LRG LVL3 (GOWN DISPOSABLE) ×6 IMPLANT
GOWN STRL REUS W/ TWL XL LVL3 (GOWN DISPOSABLE) ×6 IMPLANT
GOWN STRL REUS W/TWL LRG LVL3 (GOWN DISPOSABLE) ×8
GOWN STRL REUS W/TWL XL LVL3 (GOWN DISPOSABLE) ×8
GUIDEWIRE ANGLED .035X150CM (WIRE) ×1 IMPLANT
HEMOSTAT SPONGE AVITENE ULTRA (HEMOSTASIS) IMPLANT
KIT BASIN OR (CUSTOM PROCEDURE TRAY) ×4 IMPLANT
KIT TURNOVER KIT B (KITS) ×4 IMPLANT
NDL 18GX1X1/2 (RX/OR ONLY) (NEEDLE) ×3 IMPLANT
NDL HYPO 25GX1X1/2 BEV (NEEDLE) ×3 IMPLANT
NEEDLE 18GX1X1/2 (RX/OR ONLY) (NEEDLE) ×4 IMPLANT
NEEDLE HYPO 25GX1X1/2 BEV (NEEDLE) ×4 IMPLANT
NS IRRIG 1000ML POUR BTL (IV SOLUTION) ×4 IMPLANT
PACK CV ACCESS (CUSTOM PROCEDURE TRAY) ×4 IMPLANT
PACK SURGICAL SETUP 50X90 (CUSTOM PROCEDURE TRAY) ×4 IMPLANT
PAD ARMBOARD 7.5X6 YLW CONV (MISCELLANEOUS) ×8 IMPLANT
SET MICROPUNCTURE 5F STIFF (MISCELLANEOUS) IMPLANT
SHEATH PINNACLE 5F 10CM (SHEATH) ×1 IMPLANT
SOAP 2 % CHG 4 OZ (WOUND CARE) ×4 IMPLANT
SUT ETHILON 3 0 PS 1 (SUTURE) ×6 IMPLANT
SUT GORETEX 6.0 TT13 (SUTURE) IMPLANT
SUT MNCRL AB 4-0 PS2 18 (SUTURE) ×5 IMPLANT
SUT PROLENE 6 0 BV (SUTURE) ×5 IMPLANT
SUT PROLENE 7 0 BV 1 (SUTURE) IMPLANT
SUT SILK 2 0 PERMA HAND 18 BK (SUTURE) IMPLANT
SUT VIC AB 3-0 SH 27 (SUTURE) ×12
SUT VIC AB 3-0 SH 27X BRD (SUTURE) ×6 IMPLANT
SYR 10ML LL (SYRINGE) ×4 IMPLANT
SYR 20CC LL (SYRINGE) ×8 IMPLANT
SYR 5ML LL (SYRINGE) ×4 IMPLANT
SYR CONTROL 10ML LL (SYRINGE) ×4 IMPLANT
TOWEL GREEN STERILE (TOWEL DISPOSABLE) ×4 IMPLANT
TOWEL GREEN STERILE FF (TOWEL DISPOSABLE) ×4 IMPLANT
UNDERPAD 30X30 (UNDERPADS AND DIAPERS) ×4 IMPLANT
WATER STERILE IRR 1000ML POUR (IV SOLUTION) ×4 IMPLANT
WIRE AMPLATZ SS-J .035X180CM (WIRE) IMPLANT
WIRE BENTSON .035X145CM (WIRE) ×2 IMPLANT

## 2018-04-25 NOTE — Op Note (Signed)
Patient name: Travis Bryan MRN: [id number] DOB: 1971-01-28 Sex: male  04/25/2018 Pre-operative Diagnosis: ESRD Post-operative diagnosis:  Same Surgeon:  Annamarie Major Assistants: Laurence Slate Procedure:   1: Removal of left internal jugular vein temporary dialysis catheter   #2: For stage left basilic vein fistula Anesthesia: General Blood Loss: Minimal Specimens: None  Findings: Cephalic vein did not distend adequately for fistula creation and so the basilic vein was used.  Indications: The patient is in need of permanent dialysis access.  He has previously had a temporary left internal jugular vein dialysis catheter placed.  Vein mapping revealed adequate cephalic and basilic vein in the left arm.  Preoperative plan was for catheter and left-sided fistula  Procedure:  The patient was identified in the holding area and taken to [place] [facility]  The patient was then placed supine on the table. general anesthesia was administered.  The patient was prepped and draped in the usual sterile fashion.  A time out was called and antibiotics were administered.  The existing left internal jugular vein dialysis catheter was threaded with a Bentson wire and the catheter was removed and a peel-away sheath was placed.  A 28 cm palindrome catheter was then inserted through the sheath which was removed.  Because of the patient's existing pacemaker, I had to bring the catheter out through a somewhat unusual tunnel.  On fluoroscopic evaluation, I could not get the kink to resolve within the catheter.  Since left-sided fistula creation has been recommended, I did not feel that having the AICD and the catheter in the left central veins would be a good idea and so I ended up removing the tunneled catheter.  I closed the cannulation site with a 3-0 nylon over a bumper.  The skin neck below the clavicle was closed with 4-0 Vicryl.  Attention was then turned towards the left arm.  The cephalic vein did not appear as  prominent as it did on the preoperative ultrasound.  The basilic vein was adequate.  I made a transverse incision at the level of antecubital crease.  I first dissected out the cephalic vein.  This was very small in caliber.  I distended it with heparin saline and it did not distend appropriately and so I did not think it was going to be an adequate vein.  It was then ligated with a 3-0 silk tie.  I then dissected out the brachial artery which was a disease-free 4 mm artery.  I then identified the basilic vein.  This was a healthy 3-4 mm vein.  It was fully mobilized and marked with an ink pen for orientation.  It was ligated distally with a 3-0 silk tie.  Next, the brachial artery was occluded with vascular clamps.  A #11 blade was used to make an arteriotomy which was extended longitudinally with Potts scissors.  The vein was cut to the appropriate length and spatulated to fit the size of the arteriotomy.  A running anastomosis was created with 6-0 Prolene.  Just prior to completion the appropriate flushing maneuvers were performed and the anastomosis was completed.  I then inspected the course of the vein to make sure there were no kinks or residual branches.  There was a good thrill within the fistula and a palpable left radial pulse.  The wound was then irrigated and the incision was closed with 2 layers of 3-0 Vicryl followed by Dermabond.  At this point, Dr. Carlis Abbott came in to place the right sided  tunnel catheter.  Please see his operative note for full details.   Disposition: To PACU stable   V. Annamarie Major, M.D. [facility] Office: [phone] Pager:  [phone]

## 2018-04-25 NOTE — Progress Notes (Signed)
[place] for Heparin and Coumadin Indication: atrial fibrillation  Patient Measurements: Height: 5\' 10"  (177.8 cm) Weight: 222 lb 14.2 oz (101.1 kg) IBW/kg (Calculated) : 73  Heparin dosing weight: 98.6 kg  Vital Signs: Temp: 98.2 F (36.8 C) (02/13 1530) Temp Source: Oral (02/13 1530) BP: 86/57 (02/13 1630) Pulse Rate: 82 (02/13 1630)  Labs: Recent Labs    04/23/18 0703 04/24/18 7591 04/25/18 0306 04/25/18 0739  HGB 10.9* 10.7* 8.7* 10.7*  HCT 35.5* 34.2* 29.3* 34.8*  PLT 309 302 336 288  LABPROT 17.1* 16.9* 14.0 16.1*  INR 1.41 1.39 1.09 1.30  HEPARINUNFRC 0.35 0.42  --  0.43  CREATININE 15.07* 11.34*  --  13.49*    Assessment: 48 year old male presenting with NV and fever, h/o afib on Coumadin PTA.  Coumadin held for procedures, Vitamin K given 2/4.  Pharmacy consulted for heparin while off Coumadin. Coumadin resumed on 2/8, but now held for AVF vs graft.  Procedure planned for 2/10 but postponed until 2/13 due to barium study 2/10.     Heparin level therapeutic this morning (0.43) on 1450 units/hr.  INR 1.30   Coumadin 7.5 mg given 2/8 and 2/9, then held for OR today.   Heparin drip held pre-op ~10am. S/p TDC placement. In HD now.   Spoke with Bailey Mech, PA with [facility].  IV heparin to resume 2/14 am per Dr. Carlis Abbott.   PTA Coumadin dose: 7.5 mg MWF, 5 mg all other days.  Goal of Therapy:  INR 2-3 Heparin level 0.3-0.7 units/ml Monitor platelets by anticoagulation protocol: Yes   Plan:    Resume heparin drip at 8am on 2/14 at 1450 units/hr   Heparin level ~8 hrs after drip resumes.   Daily heparin level, PT/INR and CBC.   Expect Coumadin to resume on 2/14.  Arty Baumgartner, [place] Pager: [phone] or phone: [phone] 04/25/2018 5:03 PM

## 2018-04-25 NOTE — Progress Notes (Signed)
Transmission report sent with bedside device at 1342.

## 2018-04-25 NOTE — Progress Notes (Signed)
Patient is scheduled for outpatient hemodialysis at the [facility] clinic on TTS @ 12:25.  Schedule has been confirmed with patient.

## 2018-04-25 NOTE — Progress Notes (Signed)
Lab called at 04:46 stating that the BUN and creatinine results were very different than what the patient had been resulting, RN stated that she would draw a STAT renal function panel as she was placing a new IV in the patient. Upon informing the patient what was happening the patient stated that no one from lab had drawn blood on him yet this am. This RN then looked in the computer and noticed that other lab results that were showing on this patient were also different than what patients previous trends. RN called lab at 04:56 and informed them that the labs that were resulting were not drawn on this patient. Upon further investigation RN determined that the labs that were resulted were drawn from the patient in room 2W23, who did not have labs ordered for this am. RN informed lab to send someone back to draw all of the patients ordered labs again as he is going to the OR today. NP Baltazar Najjar was notified of lab error at 05:02 via text page.

## 2018-04-25 NOTE — Anesthesia Procedure Notes (Signed)
Procedure Name: LMA Insertion Date/Time: 04/25/2018 10:22 AM Performed by: Neldon Newport, CRNA Pre-anesthesia Checklist: Timeout performed, Patient being monitored, Suction available, Emergency Drugs available and Patient identified Patient Re-evaluated:Patient Re-evaluated prior to induction Oxygen Delivery Method: Circle system utilized Preoxygenation: Pre-oxygenation with 100% oxygen Induction Type: IV induction Ventilation: Mask ventilation without difficulty LMA: LMA inserted LMA Size: 5.0 Number of attempts: 1 Placement Confirmation: breath sounds checked- equal and bilateral and positive ETCO2 Tube secured with: Tape Dental Injury: Teeth and Oropharynx as per pre-operative assessment

## 2018-04-25 NOTE — Progress Notes (Signed)
PROGRESS NOTE    Travis Bryan  DDU:[id number] DOB: [birth year] DOA: 03/28/2018 PCP: Carroll Sage, MD   Brief Narrative:  48 year old with past medical history relevant for hypertension, stage IV CKD, nonischemic chronic systolic CHF (EF of 25 to 30%) status post AICD, paroxysmal atrial fibrillation status post DC cardioversion and ablation, type 2 diabetes on insulin, hypertension, hyperlipidemia, gout admitted on 03/28/2018 with chills and nausea and vomiting.  Hospital course was complicated by acute hypoxic respiratory failure and subsequent CODE BLUE on 04/01/2018 due to likely human metapneumovirus pneumonia with superimposed bacterial H CAP/ARDS as well as need for CRRT now likely ESRD.   Assessment & Plan:   Principal Problem:   Acute respiratory failure with hypoxia (HCC) Active Problems:   Hypertension associated with diabetes (HCC)   Automatic implantable cardioverter-defibrillator in situ   Type 2 diabetes mellitus with complication, with long-term current use of insulin (HCC)   Acute on chronic systolic heart failure, NYHA class 4 (HCC)   Atrial fibrillation (HCC)   CAD (coronary artery disease)   Elevated troponin   AKI (acute kidney injury) ([name])   Bilateral pneumonia   Pressure injury of skin  #) Stage IV CKD complicated by ESRD: Patient developed ESRD in the setting of septic shock.  Currently patient is felt to be likely dialysis dependent/ESRD. -Pending likely left AV fistula placement by vascular surgery on 04/25/2018 as well as PermCath - Nephrology following, appreciate recommendations -Pending outpatient dialysis bed -Continue phosphate binder  #) PEA arrest due to human metapneumovirus pneumonia/H CAP/ards: Currently patient is completed a course of antibiotics and is simply on 2 to 4 L nasal cannula for respiratory support.  Suspect patient will likely need this long-term outpatient.  #) Paroxysmal atrial fibrillation status post cardioversion and ablation:  Patient continues to have runs of atrial fibrillation -Continue amiodarone 200 mg daily - Continue heparin drip -Plan to start warfarin after patient has AV fistula placement by vascular surgery  #) Nonischemic cardiomyopathy status post ICD: Volume status is predominantly being managed with dialysis now as patient is relatively anuric. - Will consider starting beta-blocker once patient's blood pressure can tolerate  #) Type 2 diabetes on insulin: -Continue sliding scale insulin -Continue glargine 20 units nightly  #) Hyperlipidemia: -Continue statin  #) Gout: -Continue allopurinol  Fluids: Restrict Electrolytes: Monitor and supplement Nutrition: Diabetic/renal diet   Prophylaxis: Heparin drip   Disposition: Pending dialysis bed  Full code  Consultants:   Nephrology  Cardiology  Pulmonary critical care  Vascular surgery   Procedures:   CRRT discontinued 04/13/2018 - 04/16/2018   04/01/2018 HD catheter insertion  04/01/2018 intubbation -   04/11/2018 TDC placed   Antimicrobials:   IV Zosyn completed course   Subjective: This morning the patient reports that he is doing well.  He is requesting to know when his vascular surgeries.  He denies any shortness of breath, nausea, vomiting, diarrhea, cough, congestion, rhinorrhea.  Objective: Vitals:   04/25/18 0119 04/25/18 0200 04/25/18 0429 04/25/18 0725  BP:   129/81 125/80  Pulse: (!) 48 89 81 85  Resp: 16 20 (!) 23 (!) 23  Temp:   99.4 F (37.4 C) 97.9 F (36.6 C)  TempSrc:   Oral Axillary  SpO2: 100% 97% 96% 94%  Weight:      Height:        Intake/Output Summary (Last 24 hours) at 04/25/2018 1119 Last data filed at 04/25/2018 0659 Gross per 24 hour  Intake 1102.26 ml  Output 300 ml  Net 802.26 ml   Filed Weights   04/23/18 1245 04/23/18 1652 04/24/18 0545  Weight: 101 kg 98.3 kg 98.6 kg    Examination:  General exam: Appears calm and comfortable  Respiratory system: No increased work of  breathing, diminished lung sounds at bases, scattered rhonchi Cardiovascular system: Irregularly irregular, no murmurs Gastrointestinal system: Soft, nondistended, no rebound or guarding, plus bowel sounds. Central nervous system: Alert and oriented.  Grossly intact, moving all extremities Extremities: Trace lower extremity edema. Skin: TDC site is clean dry and intact Psychiatry: Judgement and insight appear normal. Mood & affect appropriate.     Data Reviewed: I have personally reviewed following labs and imaging studies  CBC: Recent Labs  Lab 04/22/18 0553 04/23/18 0703 04/24/18 0638 04/25/18 0306 04/25/18 0739  WBC 5.4 5.1 6.1 8.4 7.1  HGB 11.3* 10.9* 10.7* 8.7* 10.7*  HCT 34.9* 35.5* 34.2* 29.3* 34.8*  MCV 91.1 92.2 92.2 89.6 91.3  PLT 312 309 302 336 779   Basic Metabolic Panel: Recent Labs  Lab 04/21/18 1652 04/22/18 0553 04/23/18 0703 04/24/18 0638 04/25/18 0739  NA 133* 134* 133* 131* 129*  K 4.8 3.6 4.0 4.0 4.6  CL 98 97* 96* 97* 92*  CO2 17* 18* 17* 22 17*  GLUCOSE 275* 325* 229* 185* 186*  BUN 71* 79* 89* 53* 64*  CREATININE 11.03* 12.68* 15.07* 11.34* 13.49*  CALCIUM 9.2 9.4 9.5 9.0 9.1  PHOS 4.4 5.6* 6.8* 4.9* 5.8*   GFR: Estimated Creatinine Clearance: 8 mL/min (A) (by C-G formula based on SCr of 13.49 mg/dL (H)). Liver Function Tests: Recent Labs  Lab 04/21/18 1652 04/22/18 0553 04/23/18 0703 04/24/18 3903 04/25/18 0739  ALBUMIN 2.4* 2.3* 2.4* 2.5* 2.3*   No results for input(s): LIPASE, AMYLASE in the last 168 hours. No results for input(s): AMMONIA in the last 168 hours. Coagulation Profile: Recent Labs  Lab 04/22/18 0553 04/23/18 0703 04/24/18 0092 04/25/18 0306 04/25/18 0739  INR 1.20 1.41 1.39 1.09 1.30   Cardiac Enzymes: No results for input(s): CKTOTAL, CKMB, CKMBINDEX, TROPONINI in the last 168 hours. BNP (last 3 results) No results for input(s): PROBNP in the last 8760 hours. HbA1C: No results for input(s): HGBA1C in  the last 72 hours. CBG: Recent Labs  Lab 04/24/18 1204 04/24/18 1525 04/24/18 2123 04/25/18 0726 04/25/18 1007  GLUCAP 277* 203* 215* 179* 192*   Lipid Profile: No results for input(s): CHOL, HDL, LDLCALC, TRIG, CHOLHDL, LDLDIRECT in the last 72 hours. Thyroid Function Tests: No results for input(s): TSH, T4TOTAL, FREET4, T3FREE, THYROIDAB in the last 72 hours. Anemia Panel: No results for input(s): VITAMINB12, FOLATE, FERRITIN, TIBC, IRON, RETICCTPCT in the last 72 hours. Sepsis Labs: No results for input(s): PROCALCITON, LATICACIDVEN in the last 168 hours.  No results found for this or any previous visit (from the past 240 hour(s)).       Radiology Studies: No results found.      Scheduled Meds: . [MAR Hold] albuterol  2.5 mg Nebulization TID  . [MAR Hold] allopurinol  100 mg Oral Daily  . [MAR Hold] amiodarone  200 mg Oral Daily  . [MAR Hold] atorvastatin  40 mg Oral q1800  . [MAR Hold] Chlorhexidine Gluconate Cloth  6 each Topical Q0600  . [MAR Hold] feeding supplement (NEPRO CARB STEADY)  237 mL Oral Q24H  . [MAR Hold] insulin aspart  0-9 Units Subcutaneous TID WC  . [MAR Hold] insulin glargine  20 Units Subcutaneous QHS  . [facility] Hold] mouth rinse  15 mL  Mouth Rinse BID  . [MAR Hold] nystatin  5 mL Oral QID  . [MAR Hold] pantoprazole  40 mg Oral Daily  . [MAR Hold] polyethylene glycol  17 g Oral Daily  . [MAR Hold] senna-docusate  1 tablet Oral BID  . [MAR Hold] sevelamer carbonate  800 mg Oral TID WC  . [MAR Hold] sodium chloride flush  10-40 mL Intracatheter Q12H  . [MAR Hold] Warfarin - Pharmacist Dosing Inpatient   Does not apply q1800   Continuous Infusions: . ceFAZolin    . heparin Stopped (04/25/18 0949)     LOS: 28 days    Time spent: [place], MD [facility]  If 7PM-7AM, please contact night-coverage [email or web address] Password [facility] 04/25/2018, 11:19 AM

## 2018-04-25 NOTE — Anesthesia Preprocedure Evaluation (Addendum)
Anesthesia Evaluation  Patient identified by MRN, date of birth, ID band Patient awake    Reviewed: Allergy & Precautions, H&P , NPO status , Patient's Chart, lab work & pertinent test results  Airway Mallampati: II   Neck ROM: full    Dental   Pulmonary former smoker,    breath sounds clear to auscultation       Cardiovascular hypertension, +CHF  + dysrhythmias Atrial Fibrillation + Cardiac Defibrillator  Rhythm:regular Rate:Normal  TTE (02/2018): EF 25-30% Non-ischemic cardiomyopathy. S/p EP ablation for AF.  04/01/2018: Pt arrested in the setting of PNA/respiratory failure.   Neuro/Psych    GI/Hepatic   Endo/Other  diabetesobese  Renal/GU ESRFRenal disease     Musculoskeletal   Abdominal   Peds  Hematology   Anesthesia Other Findings   Reproductive/Obstetrics                            Anesthesia Physical Anesthesia Plan  ASA: IV  Anesthesia Plan: General   Post-op Pain Management:    Induction: Intravenous  PONV Risk Score and Plan: 2 and Ondansetron, Dexamethasone, Midazolam and Treatment may vary due to age or medical condition  Airway Management Planned: LMA  Additional Equipment:   Intra-op Plan:   Post-operative Plan:   Informed Consent: I have reviewed the patients History and Physical, chart, labs and discussed the procedure including the risks, benefits and alternatives for the proposed anesthesia with the patient or authorized representative who has indicated his/her understanding and acceptance.       Plan Discussed with: CRNA, Anesthesiologist and Surgeon  Anesthesia Plan Comments:        Anesthesia Quick Evaluation

## 2018-04-25 NOTE — Progress Notes (Signed)
Called by RN. Labs were drawn on wrong pt. The am labs in the computer on this pt are NOT his.  KJKG, NP [facility]

## 2018-04-25 NOTE — Transfer of Care (Signed)
Immediate Anesthesia Transfer of Care Note  Patient: Travis Bryan  Procedure(s) Performed: INSERTION OF TUNNELED DIALYSIS CATHETER (Right Neck) ARTERIOVENOUS (AV) VS GRAFT ARM (Left Arm Upper) Attempted Exchange Of A Dialysis Catheter on the Left side (Left Neck)  Patient Location: PACU  Anesthesia Type:General  Level of Consciousness: awake, alert  and oriented  Airway & Oxygen Therapy: Patient Spontanous Breathing and Patient connected to face mask oxygen  Post-op Assessment: Report given to RN, Post -op Vital signs reviewed and stable and Patient moving all extremities X 4  Post vital signs: Reviewed and stable  Last Vitals:  Vitals Value Taken Time  BP 113/92 04/25/2018  1:08 PM  Temp    Pulse    Resp 23 04/25/2018  1:09 PM  SpO2    Vitals shown include unvalidated device data.  Last Pain:  Vitals:   04/25/18 0725  TempSrc: Axillary  PainSc:       Patients Stated Pain Goal: 0 (24/23/53 6144)  Complications: No apparent anesthesia complications

## 2018-04-25 NOTE — Op Note (Signed)
OPERATIVE NOTE  PROCEDURE: 1. Right internal jugular vein cannulation under ultrasound guidance 2. Right internal jugular vein tunneled dialysis catheter placement (23 cm Palindrome)  PRE-OPERATIVE DIAGNOSIS: end-stage renal failure  POST-OPERATIVE DIAGNOSIS: same as above  SURGEON: Marty Heck, MD  ANESTHESIA: general  ESTIMATED BLOOD LOSS: 30 cc  FINDING(S): 1.  Tips of the catheter in the right atrium on fluoroscopy 2.  No obvious pneumothorax on fluoroscopy 3.  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  SPECIMEN(S):  none  INDICATIONS:   Travis Bryan is a 48 y.o. male who presents with end stage renal disease.  My partner Dr. Trula Slade initially placed a left IJ tunneled catheter after exchanging for his existing temporary catheter as well as a left arm for stage brachiobasilic fistula.  Unfortunately given that he has a ICD on the left there was a lot of trouble tunneling the catheter and the catheter appeared kinked and was not working.  As a result I came to the operating room to take over the case and plan for a right IJ tunnel catheter placement.  Previous left IJ tunnel catheter was removed.   The patient presents for tunneled dialysis catheter placement.  The patient is aware the risks of tunneled dialysis catheter placement include but are not limited to: bleeding, infection, central venous injury, pneumothorax, possible venous stenosis, possible malpositioning in the venous system, and possible infections related to long-term catheter presence.  The patient was aware of these risks and agreed to proceed.  DESCRIPTION: The patient was already in OR 12 under anesthetic when I entered the room.  Please see Dr. Stephens Shire dictation for earlier portions of the case.  The right neck was prepped and draped in standard sterile  fashion.  Another timeout was performed to identify patient, procedure, and site.  Under ultrasound guidance, the right internal jugular vein was cannulated with an 18 gauge needle.  I attempted to place a J-wire into the right ventricle but met resistance and it was apparent that the patient had a central venous stenosis.  I then placed a short 5 French sheath in the right internal jugular vein over the J-wire that had been placed into the vein after needle cannulation.  I then exchanged for a soft angled Glidewire and a KMP catheter and I was able to cross the central stenosis into the right atrium of the heart.  At that point in time I then removed the soft angled Glidewire and exchanged back for the stiffer J-wire through the KMP catheter.  The KMP catheter was then removed and the 5 French sheath was removed.   I then made stab incisions at the neck and exit sites.   I dissected from the exit site to the cannulation site with a tunneler. The back end of this catheter was transected, and docked onto the tunneler.   A 23 cm Palindrome catheter was then tunneled on the chest wall.   I performed sequential dilation over the wire and placed the large dilator peel-away sheath under fluoroscopic guidance.  I met essentially no resistance during dilation and during peel-away sheath placement.   The dilator and wire were removed in the peel-away sheath.  A 23 cm Diatek catheter was placed under fluoroscopic guidance down into the right atrium.  The sheath was broken and peeled away while holding the  catheter cuff at the level of the skin.  The catheter was transected a second time, revealing the two lumens of this catheter.  The ports were docked onto these two lumens.  The catheter collar was then snapped into place.  Each port was tested by aspirating and flushing.  No resistance was noted.  Each port was then thoroughly flushed with heparinized saline.  The catheter was secured in placed with two interrupted stitches  of 3-0 Nylon tied to the catheter.  The neck incision was closed with a U-stitch of 4-0 Monocryl.  The neck and chest incision were cleaned and sterile bandages applied.  Each port was then loaded with concentrated heparin (1000 Units/mL) at the manufacturer recommended volumes to each port.  Sterile caps were applied to each port.    On completion fluoroscopy, the tips of the catheter were in the right atrium, and there was no evidence of pneumothorax.   COMPLICATIONS: None  CONDITION: Stable  Marty Heck, MD [facility] Office: [phone] Pager: [phone]   04/25/2018, 12:55 PM

## 2018-04-25 NOTE — Progress Notes (Signed)
  Spoke with Estanislado Spire with [employer]. ICD is on and will send transmission report of device with our machine in PACU. Dionne Milo to call PACU when report received.

## 2018-04-25 NOTE — Progress Notes (Signed)
[facility]    NEPHROLOGY PROGRESS NOTE  SUBJECTIVE: Without acute complaints today.  Denies chest pain, shortness of breath, nausea, vomiting, diarrhea or dysuria.  Does complain of some ongoing fatigue.  All other review of systems are negative.  Vascular surgeon planning for a permacath and fistula today(cancelled Mon due to MBSS). Outpt schedule TTS confirmed     OBJECTIVE:  Vitals:   04/25/18 0429 04/25/18 0725  BP: 129/81 125/80  Pulse: 81 85  Resp: (!) 23 (!) 23  Temp: 99.4 F (37.4 C) 97.9 F (36.6 C)  SpO2: 96% 94%    Intake/Output Summary (Last 24 hours) at 04/25/2018 4010 Last data filed at 04/25/2018 2725 Gross per 24 hour  Intake 1102.26 ml  Output 300 ml  Net 802.26 ml      Genearl:  AAOx3 NAD HEENT: MMM [place] AT anicteric sclera Neck:  No JVD, no adenopathy left IJ temporary dialysis catheter CV:  Heart RRR  Lungs:  L/S CTA bilaterally Abd:  abd SNT/ND with normal BS GU:  Bladder non-palpable Extremities:  No LE edema. Skin:  No skin rash  MEDICATIONS:  . albuterol  2.5 mg Nebulization TID  . allopurinol  100 mg Oral Daily  . amiodarone  200 mg Oral Daily  . atorvastatin  40 mg Oral q1800  . Chlorhexidine Gluconate Cloth  6 each Topical Q0600  . Chlorhexidine Gluconate Cloth  6 each Topical Q0600  . feeding supplement (NEPRO CARB STEADY)  237 mL Oral Q24H  . insulin aspart  0-9 Units Subcutaneous TID WC  . insulin glargine  20 Units Subcutaneous QHS  . mouth rinse  15 mL Mouth Rinse BID  . nystatin  5 mL Oral QID  . pantoprazole  40 mg Oral Daily  . polyethylene glycol  17 g Oral Daily  . senna-docusate  1 tablet Oral BID  . sevelamer carbonate  800 mg Oral TID WC  . sodium chloride flush  10-40 mL Intracatheter Q12H  . Warfarin - Pharmacist Dosing Inpatient   Does not apply q1800       LABS:   CBC Latest Ref Rng & Units 04/25/2018 04/25/2018 04/24/2018  WBC 4.0 - 10.5 K/uL 7.1 8.4 6.1  Hemoglobin 13.0 - 17.0 g/dL 10.7(L)  8.7(L) 10.7(L)  Hematocrit 39.0 - 52.0 % 34.8(L) 29.3(L) 34.2(L)  Platelets 150 - 400 K/uL 288 336 302    CMP Latest Ref Rng & Units 04/25/2018 04/24/2018 04/23/2018  Glucose 70 - 99 mg/dL 186(H) 185(H) 229(H)  BUN 6 - 20 mg/dL 64(H) 53(H) 89(H)  Creatinine 0.61 - 1.24 mg/dL 13.49(H) 11.34(H) 15.07(H)  Sodium 135 - 145 mmol/L 129(L) 131(L) 133(L)  Potassium 3.5 - 5.1 mmol/L 4.6 4.0 4.0  Chloride 98 - 111 mmol/L 92(L) 97(L) 96(L)  CO2 22 - 32 mmol/L 17(L) 22 17(L)  Calcium 8.9 - 10.3 mg/dL 9.1 9.0 9.5  Total Protein 6.5 - 8.1 g/dL - - -  Total Bilirubin 0.3 - 1.2 mg/dL - - -  Alkaline Phos 38 - 126 U/L - - -  AST 15 - 41 U/L - - -  ALT 0 - 44 U/L - - -    Lab Results  Component Value Date   CALCIUM 9.1 04/25/2018   CAION 1.19 04/15/2018   PHOS 5.8 (H) 04/25/2018       Component Value Date/Time   COLORURINE YELLOW 03/28/2018 1040   APPEARANCEUR CLEAR 03/28/2018 1040   LABSPEC 1.015 03/28/2018 1040   PHURINE 7.0 03/28/2018 1040   GLUCOSEU  50 (A) 03/28/2018 1040   HGBUR NEGATIVE 03/28/2018 [place] 03/28/2018 1040   KETONESUR NEGATIVE 03/28/2018 1040   PROTEINUR 100 (A) 03/28/2018 1040   UROBILINOGEN 0.2 12/18/2006 0428   NITRITE NEGATIVE 03/28/2018 1040   LEUKOCYTESUR NEGATIVE 03/28/2018 1040      Component Value Date/Time   PHART 7.330 (L) 04/15/2018 0435   PCO2ART 47.7 04/15/2018 0435   PO2ART 101.0 04/15/2018 0435   HCO3 25.1 04/15/2018 0435   TCO2 27 04/15/2018 0435   ACIDBASEDEF 1.0 04/15/2018 0435   O2SAT 97.0 04/15/2018 0435       Component Value Date/Time   IRON 56 12/18/2006 0736   TIBC 314 12/18/2006 0736   FERRITIN 65 12/18/2006 0736   IRONPCTSAT 18 (L) 12/18/2006 0736       ASSESSMENT/PLAN:     1.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 S/p CRRT- came off 2/4.Dialysis Tuesday. Minimal urine output  noted.  Plan for permacath and fistula today.  Given outpatient TTS schedule confirmed start on Tues 2/18. Will plan for HD today after OR, then Sat.   2. Acute hypoxic respiratory failure:With evidence of ARDS and preceding viral pneumonia/superimposed HCAP versus aspiration pneumonia-s/p zosyn course.Stable on ra now  3.Anemia:Hemoglobin 10.7. stable.  No further darbepoetin at this time.  4. History of atrial fibrillation/atrial flutter status post DCCV and ablation.He is currently rate controlled  5. Acute exacerbation of CHF with cardiogenic shock:per primary, appears resolved.  Maintain euvolemia with UF.   6.  Hyperphosphatemia.  We will continue phosphate binder. controlled  7.  DM.  Per primary  8.  Hyponatremia:  Should be on 1265mL fluid restriction.  Will dialyze against Na 135 today.    9. Dispo: [facility] 2/18 start 12:25.   [place] for discharge from nephrology perspective.    Jannifer Hick MD [facility] Pager [phone]

## 2018-04-25 NOTE — Progress Notes (Signed)
Per Caryl Pina in Hemodiaylsis states patient has been clipped and he will be going to [facility] PTS 12:25 chair time, starting next Tuesday 2/18.

## 2018-04-25 NOTE — Interval H&P Note (Signed)
History and Physical Interval Note:  04/25/2018 9:59 AM  Travis Bryan  has presented today for surgery, with the diagnosis of esrd  The various methods of treatment have been discussed with the patient and family. After consideration of risks, benefits and other options for treatment, the patient has consented to  Procedure(s): INSERTION OF TUNNELED DIALYSIS CATHETER (Left) ARTERIOVENOUS (AV) VS GRAFT ARM (Left) as a surgical intervention .  The patient's history has been reviewed, patient examined, no change in status, stable for surgery.  I have reviewed the patient's chart and labs.  Questions were answered to the patient's satisfaction.     Annamarie Major

## 2018-04-26 ENCOUNTER — Other Ambulatory Visit: Payer: Self-pay

## 2018-04-26 ENCOUNTER — Encounter (HOSPITAL_COMMUNITY): Payer: Self-pay | Admitting: Surgery

## 2018-04-26 ENCOUNTER — Inpatient Hospital Stay (HOSPITAL_COMMUNITY)
Admission: RE | Admit: 2018-04-26 | Discharge: 2018-05-04 | DRG: 945 | Disposition: A | Discharge status: Home Health | Payer: Medicaid Other | Source: Intra-hospital | Attending: Physical Medicine & Rehabilitation | Admitting: Physical Medicine & Rehabilitation

## 2018-04-26 DIAGNOSIS — Z87891 Personal history of nicotine dependence: Secondary | ICD-10-CM | POA: Diagnosis not present

## 2018-04-26 DIAGNOSIS — Z79899 Other long term (current) drug therapy: Secondary | ICD-10-CM | POA: Diagnosis not present

## 2018-04-26 DIAGNOSIS — I4819 Other persistent atrial fibrillation: Secondary | ICD-10-CM | POA: Diagnosis present

## 2018-04-26 DIAGNOSIS — R5381 Other malaise: Principal | ICD-10-CM | POA: Diagnosis present

## 2018-04-26 DIAGNOSIS — Z794 Long term (current) use of insulin: Secondary | ICD-10-CM

## 2018-04-26 DIAGNOSIS — Z7901 Long term (current) use of anticoagulants: Secondary | ICD-10-CM

## 2018-04-26 DIAGNOSIS — E1122 Type 2 diabetes mellitus with diabetic chronic kidney disease: Secondary | ICD-10-CM | POA: Diagnosis present

## 2018-04-26 DIAGNOSIS — R1031 Right lower quadrant pain: Secondary | ICD-10-CM | POA: Diagnosis not present

## 2018-04-26 DIAGNOSIS — N179 Acute kidney failure, unspecified: Secondary | ICD-10-CM | POA: Diagnosis present

## 2018-04-26 DIAGNOSIS — I132 Hypertensive heart and chronic kidney disease with heart failure and with stage 5 chronic kidney disease, or end stage renal disease: Secondary | ICD-10-CM | POA: Diagnosis present

## 2018-04-26 DIAGNOSIS — I480 Paroxysmal atrial fibrillation: Secondary | ICD-10-CM

## 2018-04-26 DIAGNOSIS — R0902 Hypoxemia: Secondary | ICD-10-CM

## 2018-04-26 DIAGNOSIS — E1169 Type 2 diabetes mellitus with other specified complication: Secondary | ICD-10-CM

## 2018-04-26 DIAGNOSIS — E785 Hyperlipidemia, unspecified: Secondary | ICD-10-CM | POA: Diagnosis present

## 2018-04-26 DIAGNOSIS — E46 Unspecified protein-calorie malnutrition: Secondary | ICD-10-CM | POA: Diagnosis present

## 2018-04-26 DIAGNOSIS — K567 Ileus, unspecified: Secondary | ICD-10-CM

## 2018-04-26 DIAGNOSIS — E118 Type 2 diabetes mellitus with unspecified complications: Secondary | ICD-10-CM

## 2018-04-26 DIAGNOSIS — I428 Other cardiomyopathies: Secondary | ICD-10-CM | POA: Diagnosis present

## 2018-04-26 DIAGNOSIS — I2510 Atherosclerotic heart disease of native coronary artery without angina pectoris: Secondary | ICD-10-CM

## 2018-04-26 DIAGNOSIS — N184 Chronic kidney disease, stage 4 (severe): Secondary | ICD-10-CM

## 2018-04-26 DIAGNOSIS — Z992 Dependence on renal dialysis: Secondary | ICD-10-CM | POA: Diagnosis not present

## 2018-04-26 DIAGNOSIS — I5022 Chronic systolic (congestive) heart failure: Secondary | ICD-10-CM

## 2018-04-26 DIAGNOSIS — E1159 Type 2 diabetes mellitus with other circulatory complications: Secondary | ICD-10-CM

## 2018-04-26 DIAGNOSIS — D638 Anemia in other chronic diseases classified elsewhere: Secondary | ICD-10-CM | POA: Diagnosis present

## 2018-04-26 DIAGNOSIS — E871 Hypo-osmolality and hyponatremia: Secondary | ICD-10-CM | POA: Diagnosis present

## 2018-04-26 DIAGNOSIS — I252 Old myocardial infarction: Secondary | ICD-10-CM

## 2018-04-26 DIAGNOSIS — Z791 Long term (current) use of non-steroidal anti-inflammatories (NSAID): Secondary | ICD-10-CM | POA: Diagnosis not present

## 2018-04-26 DIAGNOSIS — I10 Essential (primary) hypertension: Secondary | ICD-10-CM

## 2018-04-26 DIAGNOSIS — Z95810 Presence of automatic (implantable) cardiac defibrillator: Secondary | ICD-10-CM

## 2018-04-26 DIAGNOSIS — G9340 Encephalopathy, unspecified: Secondary | ICD-10-CM | POA: Diagnosis present

## 2018-04-26 DIAGNOSIS — N186 End stage renal disease: Secondary | ICD-10-CM | POA: Diagnosis present

## 2018-04-26 DIAGNOSIS — Z9889 Other specified postprocedural states: Secondary | ICD-10-CM

## 2018-04-26 LAB — HEPARIN LEVEL (UNFRACTIONATED): HEPARIN UNFRACTIONATED: 0.4 [IU]/mL (ref 0.30–0.70)

## 2018-04-26 LAB — PROTIME-INR
INR: 1.4
PROTHROMBIN TIME: 17 s — AB (ref 11.4–15.2)

## 2018-04-26 LAB — GLUCOSE, CAPILLARY
GLUCOSE-CAPILLARY: 145 mg/dL — AB (ref 70–99)
Glucose-Capillary: 114 mg/dL — ABNORMAL HIGH (ref 70–99)
Glucose-Capillary: 210 mg/dL — ABNORMAL HIGH (ref 70–99)
Glucose-Capillary: 164 mg/dL — ABNORMAL HIGH (ref 70–99)

## 2018-04-26 LAB — CBC
HCT: 31.9 % — ABNORMAL LOW (ref 39.0–52.0)
Hemoglobin: 9.8 g/dL — ABNORMAL LOW (ref 13.0–17.0)
MCH: 28.6 pg (ref 26.0–34.0)
MCHC: 30.7 g/dL (ref 30.0–36.0)
MCV: 93 fL (ref 80.0–100.0)
Platelets: 272 10*3/uL (ref 150–400)
RBC: 3.43 MIL/uL — ABNORMAL LOW (ref 4.22–5.81)
RDW: 15.8 % — ABNORMAL HIGH (ref 11.5–15.5)
WBC: 7.3 10*3/uL (ref 4.0–10.5)
nRBC: 0 % (ref 0.0–0.2)

## 2018-04-26 MED ORDER — ALUM & MAG HYDROXIDE-SIMETH 200-200-20 MG/5ML PO SUSP
30.0000 mL | ORAL | Status: DC | PRN
  Administered 2018-04-27: 30 mL via ORAL
  Filled 2018-04-26: qty 30

## 2018-04-26 MED ORDER — CHLORHEXIDINE GLUCONATE CLOTH 2 % EX PADS
6.0000 | MEDICATED_PAD | Freq: Every day | CUTANEOUS | Status: DC
  Administered 2018-04-27 – 2018-05-03 (×4): 6 via TOPICAL

## 2018-04-26 MED ORDER — PRO-STAT SUGAR FREE PO LIQD
30.0000 mL | Freq: Two times a day (BID) | ORAL | Status: DC
  Administered 2018-04-26 – 2018-05-04 (×14): 30 mL via ORAL
  Filled 2018-04-26 (×15): qty 30

## 2018-04-26 MED ORDER — POLYETHYLENE GLYCOL 3350 17 G PO PACK: 17.0000 g | PACK | Freq: Every day | ORAL | Status: DC | PRN

## 2018-04-26 MED ORDER — TRAZODONE HCL 50 MG PO TABS
25.0000 mg | ORAL_TABLET | Freq: Every evening | ORAL | Status: DC | PRN
  Administered 2018-05-01 – 2018-05-02 (×2): 50 mg via ORAL
  Filled 2018-04-26 (×3): qty 1

## 2018-04-26 MED ORDER — HEPARIN (PORCINE) 25000 UT/250ML-% IV SOLN
1300.0000 [IU]/h | INTRAVENOUS | Status: DC
  Administered 2018-04-26 – 2018-04-29 (×4): 1450 [IU]/h via INTRAVENOUS
  Administered 2018-04-29: 1400 [IU]/h via INTRAVENOUS
  Administered 2018-04-30 – 2018-05-02 (×3): 1300 [IU]/h via INTRAVENOUS
  Filled 2018-04-26 (×8): qty 250

## 2018-04-26 MED ORDER — DIPHENHYDRAMINE HCL 25 MG PO CAPS: 25.0000 mg | ORAL_CAPSULE | Freq: Four times a day (QID) | ORAL | Status: DC | PRN

## 2018-04-26 MED ORDER — ALLOPURINOL 100 MG PO TABS
100.0000 mg | ORAL_TABLET | Freq: Every day | ORAL | Status: DC
  Administered 2018-04-27 – 2018-05-04 (×8): 100 mg via ORAL
  Filled 2018-04-26 (×9): qty 1

## 2018-04-26 MED ORDER — WARFARIN - PHARMACIST DOSING INPATIENT
Freq: Every day | Status: DC
  Administered 2018-04-27 – 2018-04-28 (×2)

## 2018-04-26 MED ORDER — BISACODYL 10 MG RE SUPP: 10.0000 mg | Freq: Every day | RECTAL | Status: DC | PRN

## 2018-04-26 MED ORDER — PROCHLORPERAZINE 25 MG RE SUPP: 12.5000 mg | Freq: Four times a day (QID) | RECTAL | Status: DC | PRN

## 2018-04-26 MED ORDER — FLEET ENEMA 7-19 GM/118ML RE ENEM: 1.0000 | ENEMA | Freq: Once | RECTAL | Status: DC | PRN

## 2018-04-26 MED ORDER — DIPHENHYDRAMINE HCL 12.5 MG/5ML PO ELIX: 12.5000 mg | ORAL_SOLUTION | Freq: Four times a day (QID) | ORAL | Status: DC | PRN

## 2018-04-26 MED ORDER — NEPRO/CARBSTEADY PO LIQD: 237.0000 mL | ORAL | Status: DC

## 2018-04-26 MED ORDER — ATORVASTATIN CALCIUM 40 MG PO TABS
40.0000 mg | ORAL_TABLET | Freq: Every day | ORAL | Status: DC
  Administered 2018-04-26 – 2018-05-03 (×8): 40 mg via ORAL
  Filled 2018-04-26 (×8): qty 1

## 2018-04-26 MED ORDER — WARFARIN SODIUM 7.5 MG PO TABS
7.5000 mg | ORAL_TABLET | Freq: Once | ORAL | Status: AC
  Administered 2018-04-26: 7.5 mg via ORAL
  Filled 2018-04-26: qty 1

## 2018-04-26 MED ORDER — INSULIN GLARGINE 100 UNIT/ML ~~LOC~~ SOLN
22.0000 [IU] | Freq: Every day | SUBCUTANEOUS | Status: DC
  Administered 2018-04-26 – 2018-05-03 (×8): 22 [IU] via SUBCUTANEOUS
  Filled 2018-04-26 (×9): qty 0.22

## 2018-04-26 MED ORDER — INSULIN ASPART 100 UNIT/ML ~~LOC~~ SOLN: 0.0000 [IU] | Freq: Every day | SUBCUTANEOUS | Status: DC

## 2018-04-26 MED ORDER — SENNOSIDES-DOCUSATE SODIUM 8.6-50 MG PO TABS
1.0000 | ORAL_TABLET | Freq: Two times a day (BID) | ORAL | Status: DC
  Administered 2018-04-27 – 2018-05-04 (×15): 1 via ORAL
  Filled 2018-04-26 (×16): qty 1

## 2018-04-26 MED ORDER — ALBUTEROL SULFATE (2.5 MG/3ML) 0.083% IN NEBU: 2.5000 mg | INHALATION_SOLUTION | RESPIRATORY_TRACT | Status: DC | PRN

## 2018-04-26 MED ORDER — SEVELAMER CARBONATE 800 MG PO TABS
800.0000 mg | ORAL_TABLET | Freq: Three times a day (TID) | ORAL | Status: DC
  Administered 2018-04-26 – 2018-05-04 (×23): 800 mg via ORAL
  Filled 2018-04-26 (×23): qty 1

## 2018-04-26 MED ORDER — HYDROCODONE-ACETAMINOPHEN 5-325 MG PO TABS
1.0000 | ORAL_TABLET | Freq: Four times a day (QID) | ORAL | Status: DC | PRN
  Administered 2018-04-27: 2 via ORAL
  Filled 2018-04-26: qty 2

## 2018-04-26 MED ORDER — PROCHLORPERAZINE EDISYLATE 10 MG/2ML IJ SOLN
5.0000 mg | Freq: Four times a day (QID) | INTRAMUSCULAR | Status: DC | PRN
  Administered 2018-04-27: 10 mg via INTRAMUSCULAR
  Filled 2018-04-26: qty 2

## 2018-04-26 MED ORDER — POLYETHYLENE GLYCOL 3350 17 G PO PACK
17.0000 g | PACK | Freq: Every day | ORAL | Status: DC
  Administered 2018-04-27 – 2018-05-04 (×8): 17 g via ORAL
  Filled 2018-04-26 (×8): qty 1

## 2018-04-26 MED ORDER — INSULIN GLARGINE 100 UNIT/ML ~~LOC~~ SOLN
22.0000 [IU] | Freq: Every day | SUBCUTANEOUS | Status: DC
  Filled 2018-04-26: qty 0.22

## 2018-04-26 MED ORDER — AMIODARONE HCL 200 MG PO TABS
200.0000 mg | ORAL_TABLET | Freq: Every day | ORAL | Status: DC
  Administered 2018-04-27 – 2018-05-04 (×8): 200 mg via ORAL
  Filled 2018-04-26 (×8): qty 1

## 2018-04-26 MED ORDER — WARFARIN SODIUM 7.5 MG PO TABS: 7.5000 mg | ORAL_TABLET | Freq: Once | ORAL | Status: DC

## 2018-04-26 MED ORDER — GUAIFENESIN-DM 100-10 MG/5ML PO SYRP: 5.0000 mL | ORAL_SOLUTION | Freq: Four times a day (QID) | ORAL | Status: DC | PRN

## 2018-04-26 MED ORDER — CHLORHEXIDINE GLUCONATE CLOTH 2 % EX PADS: 6.0000 | MEDICATED_PAD | Freq: Every day | CUTANEOUS | Status: DC

## 2018-04-26 MED ORDER — LIP MEDEX EX OINT
TOPICAL_OINTMENT | CUTANEOUS | Status: DC | PRN
  Filled 2018-04-26: qty 7

## 2018-04-26 MED ORDER — INSULIN ASPART 100 UNIT/ML ~~LOC~~ SOLN
0.0000 [IU] | Freq: Three times a day (TID) | SUBCUTANEOUS | Status: DC
  Administered 2018-04-26 – 2018-04-27 (×2): 1 [IU] via SUBCUTANEOUS
  Administered 2018-04-27: 2 [IU] via SUBCUTANEOUS
  Administered 2018-04-27 – 2018-04-29 (×3): 1 [IU] via SUBCUTANEOUS
  Administered 2018-04-30: 2 [IU] via SUBCUTANEOUS
  Administered 2018-05-01 – 2018-05-03 (×3): 1 [IU] via SUBCUTANEOUS

## 2018-04-26 MED ORDER — PROCHLORPERAZINE MALEATE 5 MG PO TABS
5.0000 mg | ORAL_TABLET | Freq: Four times a day (QID) | ORAL | Status: DC | PRN
  Administered 2018-04-27: 10 mg via ORAL
  Administered 2018-04-27: 5 mg via ORAL
  Filled 2018-04-26: qty 2
  Filled 2018-04-26: qty 1

## 2018-04-26 MED ORDER — ACETAMINOPHEN 325 MG PO TABS: 650.0000 mg | ORAL_TABLET | Freq: Four times a day (QID) | ORAL | Status: DC | PRN

## 2018-04-26 MED ORDER — NYSTATIN 100000 UNIT/ML MT SUSP
5.0000 mL | Freq: Four times a day (QID) | OROMUCOSAL | Status: DC
  Administered 2018-04-26 – 2018-05-04 (×25): 500000 [IU] via ORAL
  Filled 2018-04-26 (×27): qty 5

## 2018-04-26 MED ORDER — ACETAMINOPHEN 325 MG PO TABS
325.0000 mg | ORAL_TABLET | ORAL | Status: DC | PRN
  Administered 2018-04-26 – 2018-05-03 (×4): 650 mg via ORAL
  Filled 2018-04-26 (×4): qty 2

## 2018-04-26 MED ORDER — HYDROCODONE-ACETAMINOPHEN 5-325 MG PO TABS: 1.0000 | ORAL_TABLET | Freq: Four times a day (QID) | ORAL | Status: DC | PRN

## 2018-04-26 NOTE — H&P (Signed)
Physical Medicine and Rehabilitation Admission H&P    Chief Complaint  Patient presents with  .  Debility    HPI: Travis Bryan is a 48 year old right-handed male with history of NICM/AICD, chronic systolic congestive heart failure, atrial flutter-on Coumadin, CKD III-IV, HTN, recent hospitalization for pneumonia, who was admitted on 03/28/2018 with low-grade fevers, nausea/vomiting, progressive dyspnea and lethargy.  He was started on BiPAP as well as broad spectrum abx and diuretics.  Work-up revealed acute on chronic renal failure as well as acute hypoxemic respiratory failure due to Metapneumovirus pneumonia in setting of  acute on chronic CHF.  He continued to have worsening of renal status with poor urine output and CRRT initiated.  He developed PEA arrest on 01//20 requiring CPR x14 minutes with ACLS protocol.  He was intubated and has required pressors due to cardiogenic shock.   Hospital course significant for ARDS, HCAP versus aspiration pneumonia, coagulopathy requiring reversal of Coumadin with FFP, abdominal distention due to ileus and acute on chronic anemia.  He tolerated extubation on 2/4 and CRRT discontinued but patient continued to have minimal without urine output without  improvement in renal status.  Hemodialysis initiated and nephrology felt that patient had progressed to ESRD--has been clipped to [facility] PTS-12:25 chair.  Left AVF placed on 02/13  by Dr. Trula Slade. He continues to have runs of PAF and on amiodarone. Heparin resumed to bridge coumadin. He continues on oxygen due to issue with hypoxia, decreased endurance with debility. CIR recommended due to functional decline and patient felt to be medically optimized.       Review of Systems  Constitutional: Negative for chills and fever.  HENT: Negative for hearing loss and tinnitus.   Eyes: Negative for blurred vision and double vision.  Respiratory: Positive for shortness of breath. Negative for cough and  wheezing.   Cardiovascular: Negative for chest pain and palpitations.  Gastrointestinal: Negative for constipation, heartburn and nausea.  Musculoskeletal: Negative for back pain, myalgias and neck pain.       Buttock pain  Skin: Negative for itching and rash.  Neurological: Positive for weakness. Negative for dizziness, sensory change, speech change and headaches.  Psychiatric/Behavioral: The patient does not have insomnia.   All other systems reviewed and are negative.   Past Medical History:  Diagnosis Date  . AICD (automatic cardioverter/defibrillator) present   . Atrial flutter ([place])    a. s/p ablation 03/2016  . Chronic systolic CHF (congestive heart failure) ([place])   . HCAP (healthcare-associated pneumonia) 03/28/2018  . History of gout   . Hyperlipidemia   . Hypertension   . Myocardial infarction [facility])    "I think I had one a long long time ago" (03/28/2016)  . NICM (nonischemic cardiomyopathy) ([place])    a. LHC 6/06: pLAD 20, pLCx 20-30; b. Echo 5/15:  EF 15%, diffuse HK, restrictive physiology, trivial AI, trivial MR, mild LAE, moderate RVE, moderately reduced RVSF, moderate RAE, mild to moderate TR, PASP 43 mmHg  . Obesity   . Persistent atrial fibrillation   . Type II diabetes mellitus ([place])     Past Surgical History:  Procedure Laterality Date  . AV FISTULA PLACEMENT Left 04/25/2018   Procedure: ARTERIOVENOUS (AV) VS GRAFT ARM;  Surgeon: Serafina Mitchell, MD;  Location: [place];  Service: Vascular;  Laterality: Left;  . CARDIOVERSION N/A 04/07/2016   Procedure: CARDIOVERSION;  Surgeon: Thayer Headings, MD;  Location: [place];  Service: Cardiovascular;  Laterality: N/A;  . CARDIOVERSION N/A 04/11/2016  Procedure: CARDIOVERSION;  Surgeon: Larey Dresser, MD;  Location: [place];  Service: Cardiovascular;  Laterality: N/A;  . ELECTROPHYSIOLOGIC STUDY N/A 03/31/2016   Procedure: A-Flutter Ablation;  Surgeon: [place] Lance, MD;  Location: [place] CV LAB;  Service:  Cardiovascular;  Laterality: N/A;  . EXCHANGE OF A DIALYSIS CATHETER Left 04/25/2018   Procedure: Attempted Exchange Of A Dialysis Catheter on the Left side;  Surgeon: Serafina Mitchell, MD;  Location: [facility] OR;  Service: Vascular;  Laterality: Left;  . IMPLANTABLE CARDIOVERTER DEFIBRILLATOR (ICD) GENERATOR CHANGE N/A 08/27/2013   Procedure: ICD GENERATOR CHANGE;  Surgeon: [place] Lance, MD;  Location: [facility] CATH LAB;  Service: Cardiovascular;  Laterality: N/A;  . INSERTION OF DIALYSIS CATHETER Right 04/25/2018   Procedure: INSERTION OF TUNNELED DIALYSIS CATHETER;  Surgeon: Serafina Mitchell, MD;  Location[facility] OR;  Service: Vascular;  Laterality: Right;  . TEE WITHOUT CARDIOVERSION N/A 03/31/2016   Procedure: TRANSESOPHAGEAL ECHOCARDIOGRAM (TEE);  Surgeon: Larey Dresser, MD;  Location[facility] ENDOSCOPY;  Service: Cardiovascular;  Laterality: N/A;    Family History  Problem Relation Age of Onset  . Hypertension Mother   . Heart disease Mother   . Diabetes Mother     Social History:  reports that he quit smoking about 15 years ago. His smoking use included cigars. He quit after 10.00 years of use. He has never used smokeless tobacco. He reports that he does not drink alcohol or use drugs.    Allergies  Allergen Reactions  . No Known Allergies     Medications Prior to Admission  Medication Sig Dispense Refill  . amiodarone (PACERONE) 200 MG tablet Take 1 tablet (200 mg total) by mouth daily. 30 tablet 5  . amLODipine (NORVASC) 5 MG tablet Take 1 tablet (5 mg total) by mouth daily. 30 tablet 6  . carvedilol (COREG) 25 MG tablet TAKE 1 TABLET BY MOUTH TWICE A DAY WITH A MEAL. (Patient taking differently: Take 25 mg by mouth 2 (two) times daily with a meal. ) 60 tablet 3  . colchicine 0.6 MG tablet Take 1 tablet (0.6 mg total) by mouth daily. 30 tablet 0  . hydrALAZINE (APRESOLINE) 100 MG tablet TAKE 1 TABLET BY MOUTH 3 TIMES DAILY. (Patient taking differently: Take 100 mg by mouth 3 (three) times  daily. ) 90 tablet 5  . insulin aspart (NOVOLOG) 100 UNIT/ML injection Substitute to any brand approved.Before each meal 3 times a day, 140-199 - 2 units, 200-250 - 4 units, 251-299 - 6 units,  300-349 - 8 units,  350 or above 10 units. Dispense syringes and needles as needed, Ok to switch to PEN if approved. DX DM2, Code E11.65 (Patient taking differently: Inject 10 Units into the skin See admin instructions. Substitute to any brand approved.Before each meal 3 times a day, 140-199 - 2 units, 200-250 - 4 units, 251-299 - 6 units,  300-349 - 8 units,  350 or above 10 units. Dispense syringes and needles as needed, Ok to switch to PEN if approved. DX DM2, Code E11.65) 1 vial 0  . insulin glargine (LANTUS) 100 UNIT/ML injection Inject 0.25 mLs (25 Units total) into the skin 2 (two) times daily. Dispense insulin pen if approved, if not dispense as needed syringes and needles for 1 month supply. Can switch to Levemir. Diagnosis E 11.65. (Patient taking differently: Inject 25 Units into the skin at bedtime. ) 10 mL 0  . potassium chloride SA (K-DUR,KLOR-CON) 20 MEQ tablet TAKE 2 TABLETS BY MOUTH  IN THE MORNING AND 1 TABLET IN THE EVENING (Patient taking differently: Take 40 mEq by mouth See admin instructions. TAKE 40 mg  IN THE MORNING AND 20 mg TABLET IN THE EVENING) 90 tablet 5  . ranolazine (RANEXA) 500 MG 12 hr tablet TAKE 1 TABLET (500 MG TOTAL) BY MOUTH 2 (TWO) TIMES DAILY. 60 tablet 5  . torsemide (DEMADEX) 20 MG tablet Take 3 tablets (60 mg total) by mouth daily. 90 tablet 5  . warfarin (COUMADIN) 5 MG tablet Take 1 tablet (5 mg total) by mouth daily at 6 PM. Take As Directed by [facility]mad[facility] (Patient taking differently: Take 5-7.5 mg by mouth daily at 6 PM. Take 7.5 mg on Mon. Wed. Fri Take 5 mg tues, thurs, sat. sunday)    . allopurinol (ZYLOPRIM) 300 MG tablet Take 300 mg by mouth daily.     Marland Kitchen amoxicillin-clavulanate (AUGMENTIN) 875-125 MG tablet Take 1 tablet by mouth every 12 (twelve) hours.  (Patient not taking: Reported on 03/28/2018) 20 tablet 0  . atorvastatin (LIPITOR) 40 MG tablet Take 1 tablet (40 mg total) by mouth daily. (Patient not taking: Reported on 03/28/2018) 90 tablet 3  . blood glucose meter kit and supplies KIT Dispense based on patient and insurance preference. Use up to four times daily as directed. (FOR ICD-9 250.00, 250.01). For QAC - HS accuchecks. 1 each 1  . calcitRIOL (ROCALTROL) 0.25 MCG capsule Take 0.25 mcg by mouth daily.    . diclofenac sodium (VOLTAREN) 1 % GEL Apply 2 g topically 4 (four) times daily. 1 Tube 0  . Insulin Syringe-Needle U-100 25G X 1" 1 ML MISC For 4 times a day insulin SQ, 1 month supply. Diagnosis E11.65 30 each 0  . Insulin Syringe-Needle U-100 31G X 15/64" 0.3 ML MISC Use to inject insulin 4 times a day 200 each 12  . isosorbide mononitrate (IMDUR) 30 MG 24 hr tablet TAKE 3 TABLETS (90 MG TOTAL) BY MOUTH DAILY. 270 tablet 3  . magnesium oxide (MAG-OX) 400 (241.3 Mg) MG tablet Take 1 tablet (400 mg total) by mouth daily. 30 tablet 6  . metolazone (ZAROXOLYN) 2.5 MG tablet TAKE 1 TABLET BY MOUTH AS NEEDED AS DIRECTED (Patient taking differently: Take 2.5 mg by mouth as needed (fluid). ) 5 tablet 0    Drug Regimen Review  Drug regimen was reviewed and remains appropriate with no significant issues identified  Home: Home Living Family/patient expects to be discharged to:: Private residence Living Arrangements: Other relatives Type of Home: House Additional Comments: Patient vauge with responses for home set up and PLOF. Pt reporting he lives with to aunts.   Functional History: Prior Function Level of Independence: Independent Comments: Pt reporting he was independent and was looking for a job.   Functional Status:  Mobility: Bed Mobility Overal bed mobility: Needs Assistance Bed Mobility: Rolling, Sidelying to Sit Rolling: Supervision Sidelying to sit: Supervision Supine to sit: Min guard Sit to supine: Min guard General  bed mobility comments: OOB in chair  Transfers Overall transfer level: Needs assistance Equipment used: Rolling walker (2 wheeled) Transfers: Sit to/from Stand Sit to Stand: Min guard Stand pivot transfers: Min assist General transfer comment: min guard for safety, cues for hand placement  Ambulation/Gait Ambulation/Gait assistance: Min guard Gait Distance (Feet): 100 Feet Assistive device: None Gait Pattern/deviations: Step-through pattern, Drifts right/left, Wide base of support General Gait Details: attempted gait without assistive device today, able to gait train approximately 164f without assistive device but does demonstrate wide  BOS; VSS during gait on 2LPM O2  Gait velocity: decreased    ADL: ADL Overall ADL's : Needs assistance/impaired Eating/Feeding: Supervision/ safety, Set up, Sitting Eating/Feeding Details (indicate cue type and reason): mod verbal cues to slow down, chew and swallow before taking next bite of food Grooming: Min guard, Cueing for sequencing, Cueing for safety, Wash/dry hands, Wash/dry face, Standing Grooming Details (indicate cue type and reason): Pt sitting at EOB with Min Guard A to perform oral care. Noting undershooting as pt would reach for swab or cup. Pt denies double vision. Pt requiring cues for sequencing.  Upper Body Bathing: Min guard, Sitting Lower Body Bathing: Minimal assistance, +2 for safety/equipment, Sit to/from stand Upper Body Dressing : Min guard, Sitting Lower Body Dressing: Moderate assistance, Sit to/from stand Lower Body Dressing Details (indicate cue type and reason): Mod A due to poor coorindation and vision for donning socks Toilet Transfer: Minimal assistance, Stand-pivot, Cueing for sequencing, Cueing for safety Toilet Transfer Details (indicate cue type and reason): simulated with transfer from eob to recliner. cues for hand placement on arm rests Toileting- Clothing Manipulation and Hygiene: Minimal assistance, Sit  to/from stand Toileting - Clothing Manipulation Details (indicate cue type and reason): simulated during transfer Functional mobility during ADLs: Minimal assistance, Cueing for sequencing, Cueing for safety General ADL Comments: notable delay with verbal response and physical initiation of tasks. multiple verbal and tactile stimulus required for task intitiation and completion.    Cognition: Cognition Overall Cognitive Status: Impaired/Different from baseline Orientation Level: Oriented X4 Cognition Arousal/Alertness: Awake/alert Behavior During Therapy: Flat affect Overall Cognitive Status: Impaired/Different from baseline Area of Impairment: Safety/judgement, Awareness, Attention Current Attention Level: Sustained Memory: Decreased short-term memory Following Commands: Follows one step commands with increased time, Follows multi-step commands consistently, Follows multi-step commands with increased time Safety/Judgement: Decreased awareness of safety Awareness: Emergent Problem Solving: Slow processing, Decreased initiation, Requires verbal cues General Comments: good awareness of lines and leads, followed cues well today    Blood pressure 104/72, pulse 84, temperature 98.3 F (36.8 C), temperature source Axillary, resp. rate 18, height 5' 10" (1.778 m), weight 99.2 kg, SpO2 94 %. Physical Exam  Nursing note and vitals reviewed. Constitutional: He is oriented to person, place, and time. He appears well-developed. He has a sickly appearance. Nasal cannula in place.  Facial edema noted.  Obese  HENT:  Head: Atraumatic.  Right Ear: External ear normal.  Left Ear: External ear normal.  Eyes: EOM are normal. Right eye exhibits no discharge. Left eye exhibits no discharge.  Neck: Normal range of motion. Neck supple.  Cardiovascular: Normal rate and regular rhythm.  Respiratory: Effort normal. He has decreased breath sounds in the right lower field and the left lower field.  +[place]    GI: Soft. Bowel sounds are normal.  Musculoskeletal:        General: No tenderness or edema.  Neurological: He is alert and oriented to person, place, and time.  Delayed processing. Able to follow basic commands.  Motor: Grossly 4+/5 throughout  Skin: Skin is warm and dry.  Psychiatric: His affect is blunt. He is agitated and slowed. Cognition and memory are impaired. He expresses inappropriate judgment. He exhibits abnormal remote memory.    Results for orders placed or performed during the hospital encounter of 03/28/18 (from the past 48 hour(s))  Glucose, capillary     Status: Abnormal   Collection Time: 04/24/18  3:25 PM  Result Value Ref Range   Glucose-Capillary 203 (H) 70 - 99  mg/dL  Glucose, capillary     Status: Abnormal   Collection Time: 04/24/18  9:23 PM  Result Value Ref Range   Glucose-Capillary 215 (H) 70 - 99 mg/dL  Protime-INR     Status: None   Collection Time: 04/25/18  3:06 AM  Result Value Ref Range   Prothrombin Time 14.0 11.4 - 15.2 seconds    Comment: QUESTIONABLE RESULTS, RECOMMEND RECOLLECT TO VERIFY   INR 1.09     Comment: QUESTIONABLE RESULTS, RECOMMEND RECOLLECT TO VERIFY Performed at [facility] [address], [place], [place] [address]   CBC     Status: Abnormal   Collection Time: 04/25/18  3:06 AM  Result Value Ref Range   WBC 8.4 4.0 - 10.5 K/uL    Comment: QUESTIONABLE RESULTS, RECOMMEND RECOLLECT TO VERIFY   RBC 3.27 (L) 4.22 - 5.81 MIL/uL    Comment: QUESTIONABLE RESULTS, RECOMMEND RECOLLECT TO VERIFY   Hemoglobin 8.7 (L) 13.0 - 17.0 g/dL    Comment: QUESTIONABLE RESULTS, RECOMMEND RECOLLECT TO VERIFY   HCT 29.3 (L) 39.0 - 52.0 %    Comment: QUESTIONABLE RESULTS, RECOMMEND RECOLLECT TO VERIFY   MCV 89.6 80.0 - 100.0 fL    Comment: QUESTIONABLE RESULTS, RECOMMEND RECOLLECT TO VERIFY   MCH 26.6 26.0 - 34.0 pg    Comment: QUESTIONABLE RESULTS, RECOMMEND RECOLLECT TO VERIFY   MCHC 29.7 (L) 30.0 - 36.0 g/dL    Comment:  QUESTIONABLE RESULTS, RECOMMEND RECOLLECT TO VERIFY   RDW 15.5 11.5 - 15.5 %    Comment: QUESTIONABLE RESULTS, RECOMMEND RECOLLECT TO VERIFY   Platelets 336 150 - 400 K/uL    Comment: QUESTIONABLE RESULTS, RECOMMEND RECOLLECT TO VERIFY   nRBC 0.0 0.0 - 0.2 %    Comment: QUESTIONABLE RESULTS, RECOMMEND RECOLLECT TO VERIFY Performed at [facility], [place] [address], [place], [place] [address]   Glucose, capillary     Status: Abnormal   Collection Time: 04/25/18  7:26 AM  Result Value Ref Range   Glucose-Capillary 179 (H) 70 - 99 mg/dL  CBC     Status: Abnormal   Collection Time: 04/25/18  7:39 AM  Result Value Ref Range   WBC 7.1 4.0 - 10.5 K/uL   RBC 3.81 (L) 4.22 - 5.81 MIL/uL   Hemoglobin 10.7 (L) 13.0 - 17.0 g/dL   HCT 34.8 (L) 39.0 - 52.0 %   MCV 91.3 80.0 - 100.0 fL   MCH 28.1 26.0 - 34.0 pg   MCHC 30.7 30.0 - 36.0 g/dL   RDW 15.5 11.5 - 15.5 %   Platelets 288 150 - 400 K/uL   nRBC 0.0 0.0 - 0.2 %    Comment: Performed at [facility], [place] [address], [place], [place] [address]  Heparin level (unfractionated)     Status: None   Collection Time: 04/25/18  7:39 AM  Result Value Ref Range   Heparin Unfractionated 0.43 0.30 - 0.70 IU/mL    Comment: (NOTE) If heparin results are below expected values, and patient dosage has  been confirmed, suggest follow up testing of antithrombin III levels. Performed at [facility], [place] [address], [place], [place] [address]   Protime-INR     Status: Abnormal   Collection Time: 04/25/18  7:39 AM  Result Value Ref Range   Prothrombin Time 16.1 (H) 11.4 - 15.2 seconds   INR 1.30     Comment: Performed at [facility] [address], [place], [place] [address]  Renal function panel     Status: Abnormal   Collection Time: 04/25/18  7:39 AM  Result Value Ref Range   Sodium 129 (L) 135 - 145 mmol/L   Potassium 4.6 3.5 - 5.1 mmol/L   Chloride 92 (L) 98 - 111 mmol/L   CO2 17 (L) 22 - 32 mmol/L   Glucose, Bld 186  (H) 70 - 99 mg/dL   BUN 64 (H) 6 - 20 mg/dL   Creatinine, Ser 13.49 (H) 0.61 - 1.24 mg/dL   Calcium 9.1 8.9 - 10.3 mg/dL   Phosphorus 5.8 (H) 2.5 - 4.6 mg/dL   Albumin 2.3 (L) 3.5 - 5.0 g/dL   GFR calc non Af Amer 4 (L) >60 mL/min   GFR calc Af Amer 4 (L) >60 mL/min   Anion gap 20 (H) 5 - 15    Comment: Performed at [facility], [address], [place], [place] [address]  Glucose, capillary     Status: Abnormal   Collection Time: 04/25/18 10:07 AM  Result Value Ref Range   Glucose-Capillary 192 (H) 70 - 99 mg/dL  Glucose, capillary     Status: Abnormal   Collection Time: 04/25/18  1:08 PM  Result Value Ref Range   Glucose-Capillary 149 (H) 70 - 99 mg/dL  Glucose, capillary     Status: Abnormal   Collection Time: 04/25/18  8:34 PM  Result Value Ref Range   Glucose-Capillary 102 (H) 70 - 99 mg/dL  Protime-INR     Status: Abnormal   Collection Time: 04/26/18  3:05 AM  Result Value Ref Range   Prothrombin Time 17.0 (H) 11.4 - 15.2 seconds   INR 1.40     Comment: Performed at [facility], [place] [address] [place], [place] [address]  Renal function panel     Status: Abnormal   Collection Time: 04/26/18  3:05 AM  Result Value Ref Range   Sodium 135 135 - 145 mmol/L   Potassium 3.9 3.5 - 5.1 mmol/L   Chloride 100 98 - 111 mmol/L   CO2 25 22 - 32 mmol/L   Glucose, Bld 117 (H) 70 - 99 mg/dL   BUN 26 (H) 6 - 20 mg/dL   Creatinine, Ser 7.58 (H) 0.61 - 1.24 mg/dL    Comment: DELTA CHECK NOTED   Calcium 8.6 (L) 8.9 - 10.3 mg/dL   Phosphorus 4.9 (H) 2.5 - 4.6 mg/dL   Albumin 2.1 (L) 3.5 - 5.0 g/dL   GFR calc non Af Amer 8 (L) >60 mL/min   GFR calc Af Amer 9 (L) >60 mL/min   Anion gap 10 5 - 15    Comment: Performed at [facility] [address] [place], [place] [address]  CBC     Status: Abnormal   Collection Time: 04/26/18  3:05 AM  Result Value Ref Range   WBC 7.3 4.0 - 10.5 K/uL   RBC 3.43 (L) 4.22 - 5.81 MIL/uL   Hemoglobin 9.8 (L) 13.0 - 17.0 g/dL   HCT  31.9 (L) 39.0 - 52.0 %   MCV 93.0 80.0 - 100.0 fL   MCH 28.6 26.0 - 34.0 pg   MCHC 30.7 30.0 - 36.0 g/dL   RDW 15.8 (H) 11.5 - 15.5 %   Platelets 272 150 - 400 K/uL   nRBC 0.0 0.0 - 0.2 %    Comment: Performed at [facility], [place] [address] [place], [place] [address]  Glucose, capillary     Status:  Abnormal   Collection Time: 04/26/18  7:25 AM  Result Value Ref Range   Glucose-Capillary 114 (H) 70 - 99 mg/dL  Glucose, capillary     Status: Abnormal   Collection Time: 04/26/18 11:43 AM  Result Value Ref Range   Glucose-Capillary 210 (H) 70 - 99 mg/dL   Dg Chest Port 1v Same Day  Result Date: 04/25/2018 CLINICAL DATA:  Status post catheter placement. EXAM: PORTABLE CHEST 1 VIEW COMPARISON:  Radiograph of April 18, 2018. FINDINGS: Stable cardiomegaly. Left-sided pacemaker is unchanged in position. Interval placement of right internal jugular dialysis catheter with distal tip in expected position of right atrium. No pneumothorax is noted. Lungs are unremarkable. Bony thorax is unremarkable. IMPRESSION: Interval placement of right internal jugular dialysis catheter with distal tip in expected position of right atrium. No pneumothorax is noted. Electronically Signed   By: Marijo Conception, M.D.   On: 04/25/2018 15:25   Dg Fluoro Guide Cv Line-no Report  Result Date: 04/25/2018 Fluoroscopy was utilized by the requesting physician.  No radiographic interpretation.       Medical Problem List and Plan: 1.  Deficits with mobility, endurance, and self-care secondary to debility.  Admit to CIR 2.  A fib/DVT Prophylaxis/Anticoagulation: Pharmaceutical: Coumadin with Heparin bridge 3. Pain Management: Hydrocodone prn for LUE pain.  4. Mood: LCSW to follow for evaluation and support.  5. Neuropsych: This patient is not fully capable of making decisions on his own behalf. 6. Skin/Wound Care: Routine pressure relief measures.  7. Fluids/Electrolytes/Nutrition: Strict I/O. Check weights  daily. Will consult dietician to educate patient on renal diet.  8. AKI on CKD: Likely ESRD- continue HD TTS. On phosphate binders and 1200 cc FR. Schedule HD at the end of the day to help with tolerance of therapy. .  9. H/o Afib/A flutter: Continues to have bouts of PAF. HR controlled on amiodarone. No BB due to soft BP.  10. Anemia of chronic disease: Monitored serially with HD, particularly given Heparin bridge.  11. T2DM: Monitor BS ac/hs. Continue Lantus 22 units at bedtime. Will add meal coverage for tighter control.  12.  Decompensated heart failure: Managed with HD--hyponatremia resolved.  Decrease FR to 1200cc. Monitor daily weights. Wean oxygen as able.  13. Low protein malnutrition: ON nephro. Will add prostat also. 50. Metavirus PNA/HCAP: Has completed antibiotic regimen. Encourage pulmonary hygiene. Wean supplemental oxygen as tolerated--has been using prn in the month PTA.    Post Admission Physician Evaluation: 1. Preadmission assessment reviewed and changes made below. 2. Functional deficits secondary  to debility. 3. Patient is admitted to receive collaborative, interdisciplinary care between the physiatrist, rehab nursing staff, and therapy team. 4. Patient's level of medical complexity and substantial therapy needs in context of that medical necessity cannot be provided at a lesser intensity of care such as a SNF. 5. Patient has experienced substantial functional loss from his/her baseline which was documented above under the "Functional History" and "Functional Status" headings.  Judging by the patient's diagnosis, physical exam, and functional history, the patient has potential for functional progress which will result in measurable gains while on inpatient rehab.  These gains will be of substantial and practical use upon discharge  in facilitating mobility and self-care at the household level. 6. Physiatrist will provide 24 hour management of medical needs as well as oversight  of the therapy plan/treatment and provide guidance as appropriate regarding the interaction of the two. 7. 24 hour rehab nursing will assist with safety, disease management, medication  administration and patient education  and help integrate therapy concepts, techniques,education, etc. 8. PT will assess and treat for/with: Lower extremity strength, range of motion, stamina, balance, functional mobility, safety, adaptive techniques and equipment, wound care, coping skills, pain control, education. Goals are: Mod I. 9. OT will assess and treat for/with: ADL's, functional mobility, safety, upper extremity strength, adaptive techniques and equipment, wound mgt, ego support, and community reintegration.   Goals are: Mod I. Therapy may proceed with showering this patient. 10. SLP will assess and treat for/with: cognition.  Goals are: Mod I. 11. Case Management and Social Worker will assess and treat for psychological issues and discharge planning. 12. Team conference will be held weekly to assess progress toward goals and to determine barriers to discharge. 13. Patient will receive at least 3 hours of therapy per day at least 5 days per week. 14. ELOS: 5-7 days.       15. Prognosis:  good  I have personally performed a face to face diagnostic evaluation, including, but not limited to relevant history and physical exam findings, of this patient and developed relevant assessment and plan.  Additionally, I have reviewed and concur with the physician assistant's documentation above.   Delice Lesch, MD, ABPMR Bary Leriche, PA-C 04/26/2018

## 2018-04-26 NOTE — Progress Notes (Signed)
Occupational Therapy Treatment Patient Details Name: Travis Bryan MRN: [id number] DOB: [birth year] Today's Date: 04/26/2018    History of present illness 48 y.o. male admitted with pneumonia and decompensated heart failure. PEA arrest on 1/20, intubated 1/20-2/04.  PMH includes but not limited to: HTN, HLD, CKD III, A fib on coumadin, AICD placement, MI, DM2.    OT comments  Pt. Agreeable to oob and participation with skilled OT.  Able to complete bed mobility and stand pivot transfer to recliner.  Notable delay in verbal and physical response to questions and tasks.  Cues required for safety, sequencing, and task completion.    Follow Up Recommendations  CIR;Supervision/Assistance - 24 hour    Equipment Recommendations  None recommended by OT    Recommendations for Other Services      Precautions / Restrictions Precautions Precautions: Fall Precaution Comments: watch O2 Restrictions Weight Bearing Restrictions: No       Mobility Bed Mobility Overal bed mobility: Needs Assistance Bed Mobility: Rolling;Sidelying to Sit Rolling: Supervision Sidelying to sit: Supervision       General bed mobility comments: no physical assistance required but cues for hand placement and use of B UEs to aide in getting trunk upright  Transfers Overall transfer level: Needs assistance   Transfers: Sit to/from Stand;Stand Pivot Transfers Sit to Stand: Min guard Stand pivot transfers: Min assist            Balance                                           ADL either performed or assessed with clinical judgement   ADL Overall ADL's : Needs assistance/impaired                         Toilet Transfer: Minimal assistance;Stand-pivot;Cueing for sequencing;Cueing for safety Toilet Transfer Details (indicate cue type and reason): simulated with transfer from eob to recliner. cues for hand placement on arm rests Toileting- Clothing Manipulation and Hygiene:  Minimal assistance;Sit to/from stand Toileting - Clothing Manipulation Details (indicate cue type and reason): simulated during transfer     Functional mobility during ADLs: Minimal assistance;Cueing for sequencing;Cueing for safety General ADL Comments: notable delay with verbal response and physical initiation of tasks. multiple verbal and tactile stimulus required for task intitiation and completion.       Vision       Perception     Praxis      Cognition Arousal/Alertness: Lethargic Behavior During Therapy: Flat affect   Area of Impairment: Safety/judgement;Awareness;Attention                   Current Attention Level: Sustained Memory: Decreased short-term memory Following Commands: Follows one step commands with increased time;Follows multi-step commands consistently Safety/Judgement: Decreased awareness of safety   Problem Solving: Slow processing;Decreased initiation;Requires verbal cues          Exercises     Shoulder Instructions       General Comments      Pertinent Vitals/ Pain       Pain Assessment: Faces Faces Pain Scale: Hurts whole lot Pain Location: L elbow Pain Descriptors / Indicators: Aching Pain Intervention(s): Limited activity within patient's tolerance;Monitored during session;Repositioned  Home Living  Prior Functioning/Environment              Frequency  Min 3X/week        Progress Toward Goals  OT Goals(current goals can now be found in the care plan section)  Progress towards OT goals: Progressing toward goals     Plan Discharge plan remains appropriate    Co-evaluation                 AM-PAC OT "6 Clicks" Daily Activity     Outcome Measure   Help from another person eating meals?: A Little Help from another person taking care of personal grooming?: A Little Help from another person toileting, which includes using toliet, bedpan, or  urinal?: A Little Help from another person bathing (including washing, rinsing, drying)?: A Little Help from another person to put on and taking off regular upper body clothing?: A Little Help from another person to put on and taking off regular lower body clothing?: A Little 6 Click Score: 18    End of Session Equipment Utilized During Treatment: Gait belt  OT Visit Diagnosis: Unsteadiness on feet (R26.81);Other abnormalities of gait and mobility (R26.89);Muscle weakness (generalized) (M62.81);Pain;Other symptoms and signs involving cognitive function   Activity Tolerance Patient tolerated treatment well   Patient Left in chair;with call bell/phone within reach   Nurse Communication Other (comment)(pt. requesting different location of o2 monitor (its on his forehead) and getting in the way of his vision )        Time: 9728-2060 OT Time Calculation (min): 16 min  Charges: OT General Charges $OT Visit: 1 Visit OT Treatments $Self Care/Home Management : 8-22 mins   Janice Coffin, COTA/L 04/26/2018, 9:57 AM

## 2018-04-26 NOTE — Care Management Note (Signed)
Case Management Note  Patient Details  Name: Travis Bryan MRN: [id number] Date of Birth: [birth year]  Subjective/Objective:   Patient does not have PCP , no insurance, NCM scheduled hospital follow up for 3/2 at 3:30 at [place] [facility], he can use the [facility] clinic for discount medications. Match letter given to patient . The dates will need to be changed on the Match letter and in [place] in order for patient to use, he has not used the Match letter yet.   He is now ESRD, will need to be clipped, financial counselor working on [place] with DSS, awaiting medicaid pending number from [place], plan is for CIR .   2/14 Tomi Bamberger RN, BSN -  Patient for dc to CIR today.  He has been clipped.                             Action/Plan: DC to CIR today,   Expected Discharge Date:  04/26/18               Expected Discharge Plan:  [place]  In-House Referral:     Discharge planning Services  CM Consult, Follow-up appt scheduled, Medication Assistance, [facility]  Post Acute Care Choice:    Choice offered to:     DME Arranged:    DME Agency:     HH Arranged:    HH Agency:     Status of Service:  Completed, signed off  If discussed at [employer], dates discussed:    Additional Comments:  Zenon Mayo, RN 04/26/2018, 2:50 PM

## 2018-04-26 NOTE — Progress Notes (Signed)
Pt was admitted to unit approx 1630 via wc. Transferred to bed x 2 assist. O2 2L/[place] continued. Oriented to unit and safety plan/precautions. Call bell within reach, will cont to monitor.   Erie Noe, LPN

## 2018-04-26 NOTE — Progress Notes (Signed)
ANTICOAGULATION CONSULT NOTE   Pharmacy Consult for Heparin and Coumadin Indication: atrial fibrillation  Patient Measurements: Height: 5\' 10"  (177.8 cm) Weight: 218 lb 11.1 oz (99.2 kg) IBW/kg (Calculated) : 73  Heparin dosing weight: 99 kg  Vital Signs: Temp: 98.4 F (36.9 C) (02/14 1636) Temp Source: Oral (02/14 1636) BP: 107/73 (02/14 1636) Pulse Rate: 80 (02/14 1636)  Labs: Recent Labs    04/24/18 8016 04/25/18 0306 04/25/18 0739 04/26/18 0305 04/26/18 1547  HGB 10.7* 8.7* 10.7* 9.8*  --   HCT 34.2* 29.3* 34.8* 31.9*  --   PLT 302 336 288 272  --   LABPROT 16.9* 14.0 16.1* 17.0*  --   INR 1.39 1.09 1.30 1.40  --   HEPARINUNFRC 0.42  --  0.43  --  0.40  CREATININE 11.34*  --  13.49* 7.58*  --     Assessment: 48 year old male presenting on 03/28/18 with NV and fever, h/o afib on Coumadin PTA.  Coumadin held for procedures, Vitamin K given 2/4.  Pharmacy consulted for heparin while off Coumadin. Coumadin resumed on 2/8, but now held for AVF vs graft.  Procedure planned for 2/10 but postponed until 2/13 due to barium study 2/10.  S/p [facility] placement on 04/25/18. Transferred to CIR on 04/26/18     Heparin drip held 2/13 am pre-op; resumed this morning per instructions from Dr. Carlis Abbott    Heparin level is therapeutic this afternoon (0.40) on 1450 units/hr. Therapeutic on same rate since 04/23/18.   Coumadin to resume today.  Last Coumadin dose given 04/21/18.   PTA Coumadin dose: 7.5 mg MWF, 5 mg all other days.  Goal of Therapy:  INR 2-3 Heparin level 0.3-0.7 units/ml Monitor platelets by anticoagulation protocol: Yes   Plan:    Continue heparin drip at 1450 units/hr   Coumadin 7.5 mg today.   Daily heparin level, PT/INR and CBC.  Arty Baumgartner,  Pager: [phone] or phone: [phone] 04/26/2018 4:43 PM

## 2018-04-26 NOTE — PMR Pre-admission (Signed)
PMR Admission Coordinator Pre-Admission Assessment  Patient: Travis Bryan is an 48 y.o., male MRN: [id number] DOB: 09-21-1970 Height: 5\' 10"  (177.8 cm) Weight: 99.2 kg              Insurance Information Self pay - no insurance  Medicaid Application Date: Pending      Case Manager:   Disability Application Date: Pending      Case Worker:    Emergency Facilities manager Information    Name Relation Home Work Mobile   [place] Relative   [phone]   [place],B[place]ly Aunt [phone]     Tamsen Roers Significant other   [phone]     Current Medical History  Patient Admitting Diagnosis: debility related to bilateral pneumonia with respiratory failure, PEA cardiac arrest, renal faliure  History of Present Illness: A 48 year old right-handed male with history of NICM/AICD, chronic systolic congestive heart failure, atrial flutter-on Coumadin, CKD III-IV, HTN, recent hospitalization for pneumonia, who was admitted on 03/28/2018 with low-grade fevers, nausea/vomiting, progressive dyspnea and lethargy.  He was started on BiPAP as well as broad spectrum antibiotics and diuretics.  Work-up revealed acute on chronic renal failure as well as acute hypoxemic respiratory failure due to Metapneumovirus pneumonia in setting of  acute on chronic CHF.  He continued to have worsening of renal status with poor urine output and CRRT initiated.  He developed PEA arrest on 01//20 requiring CPR x14 minutes with ACLS protocol.  He was intubated and has required pressors due to cardiogenic shock.   Hospital course significant for ARDS, H CAP versus aspiration pneumonia, coagulopathy requiring reversal of Coumadin with FFP, abdominal distention due to ileus and  acute on chronic anemia.  He tolerated extubation on 2/4 and CRRT discontinued but patient continued to have minimal without urine output and no improvement in renal status.  Hemodialysis initiated and nephrology felt that patient had  progressed to ESRD and Left AVF placed on 02/13  by Dr. Trula Slade.     Past Medical History  Past Medical History:  Diagnosis Date  . AICD (automatic cardioverter/defibrillator) present   . Atrial flutter ([place])    a. s/p ablation 03/2016  . Chronic systolic CHF (congestive heart failure) ([place])   . HCAP (healthcare-associated pneumonia) 03/28/2018  . History of gout   . Hyperlipidemia   . Hypertension   . Myocardial infarction [facility])    "I think I had one a long long time ago" (03/28/2016)  . NICM (nonischemic cardiomyopathy) ([place])    a. LHC 6/06: pLAD 20, pLCx 20-30; b. Echo 5/15:  EF 15%, diffuse HK, restrictive physiology, trivial AI, trivial MR, mild LAE, moderate RVE, moderately reduced RVSF, moderate RAE, mild to moderate TR, PASP 43 mmHg  . Obesity   . Persistent atrial fibrillation   . Type II diabetes mellitus (HCC)     Family History  family history includes Diabetes in his mother; Heart disease in his mother; Hypertension in his mother.  Prior Rehab/Hospitalizations:  Has the patient had major surgery during 100 days prior to admission? No  Current Medications   Current Facility-Administered Medications:  .  acetaminophen (TYLENOL) tablet 650 mg, 650 mg, Oral, Q6H PRN, Cherene Altes, MD .  albuterol (PROVENTIL) (2.5 MG/3ML) 0.083% nebulizer solution 2.5 mg, 2.5 mg, Nebulization, Q2H PRN, Cherene Altes, MD .  allopurinol (ZYLOPRIM) tablet 100 mg, 100 mg, Oral, Daily, Laurence Slate M, PA-C, 100 mg at 04/26/18 0946 .  amiodarone (PACERONE) tablet 200 mg, 200 mg, Oral, Daily, Collins, Emma M, PA-C,  200 mg at 04/26/18 0946 .  atorvastatin (LIPITOR) tablet 40 mg, 40 mg, Oral, q1800, Ulyses Amor, PA-C, 40 mg at 04/25/18 2045 .  bisacodyl (DULCOLAX) suppository 10 mg, 10 mg, Rectal, Daily PRN, Ulyses Amor, PA-C, 10 mg at 04/06/18 0049 .  Chlorhexidine Gluconate Cloth 2 % PADS 6 each, 6 each, Topical, Q0600, Ulyses Amor, PA-C, 6 each at 04/26/18 [phone] .   Chlorhexidine Gluconate Cloth 2 % PADS 6 each, 6 each, Topical, Q0600, Justin Mend, MD .  diphenhydrAMINE (BENADRYL) capsule 25 mg, 25 mg, Oral, Q6H PRN, Cherene Altes, MD .  feeding supplement (NEPRO CARB STEADY) liquid 237 mL, 237 mL, Oral, Q24H, Collins, Emma M, PA-C, Last Rate: 0 mL/hr at 04/24/18 1600, 237 mL at 04/26/18 1310 .  heparin ADULT infusion 100 units/mL (25000 units/245mL sodium chloride 0.45%), 1,450 Units/hr, Intravenous, Continuous, Skeet Simmer, [facility], Last Rate: 14.5 mL/hr at 04/26/18 0935, 1,450 Units/hr at 04/26/18 0935 .  HYDROcodone-acetaminophen (NORCO/VICODIN) 5-325 MG per tablet 1-2 tablet, 1-2 tablet, Oral, Q6H PRN, Cherene Altes, MD .  insulin aspart (novoLOG) injection 0-9 Units, 0-9 Units, Subcutaneous, TID WC, Collins, Emma M, PA-C, 3 Units at 04/26/18 1146 .  insulin glargine (LANTUS) injection 22 Units, 22 Units, Subcutaneous, QHS, McClung, Kimberlee Nearing, MD .  lip balm (CARMEX) ointment, , Topical, PRN, Ulyses Amor, PA-C, 1 application at 35/32/99 1341 .  MEDLINE mouth rinse, 15 mL, Mouth Rinse, BID, Collins, Emma M, PA-C, 15 mL at 04/24/18 1042 .  nystatin (MYCOSTATIN) 100000 UNIT/ML suspension 500,000 Units, 5 mL, Oral, QID, Ulyses Amor, PA-C, 500,000 Units at 04/26/18 1310 .  ondansetron (ZOFRAN) injection 4 mg, 4 mg, Intravenous, Q6H PRN, 4 mg at 04/22/18 1539 **OR** [DISCONTINUED] ondansetron (ZOFRAN) tablet 4 mg, 4 mg, Oral, Q6H PRN, Isabelle Course, MD .  polyethylene glycol (MIRALAX / GLYCOLAX) packet 17 g, 17 g, Oral, Daily, Laurence Slate M, PA-C, 17 g at 04/26/18 0945 .  RESOURCE THICKENUP CLEAR, , Oral, PRN, Collins, Emma M, PA-C .  senna-docusate (Senokot-S) tablet 1 tablet, 1 tablet, Oral, BID, Ulyses Amor, [place], 1 tablet at 04/26/18 2426 .  sevelamer carbonate (RENVELA) tablet 800 mg, 800 mg, Oral, TID WC, Collins, Emma M, PA-C, 800 mg at 04/26/18 1147 .  sodium chloride flush (NS) 0.9 % injection 10-40 mL, 10-40 mL,  Intracatheter, Q12H, Collins, Emma M, PA-C, 10 mL at 04/25/18 2222 .  sodium chloride flush (NS) 0.9 % injection 10-40 mL, 10-40 mL, Intracatheter, PRN, Laurence Slate M, PA-C, 10 mL at 04/25/18 1010 .  warfarin (COUMADIN) tablet 7.5 mg, 7.5 mg, Oral, ONCE-1800, Skeet Simmer, RPH .  Warfarin - Pharmacist Dosing Inpatient, , Does not apply, q1800, Ulyses Amor, PA-C, Stopped at 04/22/18 1800  Patients Current Diet:  Diet Order            Diet renal/carb modified with fluid restriction Diet-HS Snack? Nothing; Fluid restriction: 2000 mL Fluid; Room service appropriate? 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  Home Assistive Devices / Equipment Home Assistive Devices/Equipment: None  Prior Device Use: Indicate devices/aids used by the patient prior to current illness, exacerbation or injury? None  Prior Functional Level Prior Function Level of Independence: Independent Comments: Pt reporting he was independent and was looking for a job.   Self Care: Did the patient need help bathing, dressing, using the toilet or eating?  Independent  Indoor Mobility: Did the patient need assistance with walking from room to room (with or without device)? Independent  Stairs: Did the patient need assistance with internal or external stairs (with or without device)? Independent  Functional Cognition: Did the patient need help planning regular tasks such as shopping or remembering to take medications? Independent  Current Functional Level Cognition  Overall Cognitive Status: Impaired/Different from baseline Current Attention Level:  Sustained Orientation Level: Oriented X4 Following Commands: Follows one step commands with increased time, Follows multi-step commands consistently, Follows multi-step commands with increased time Safety/Judgement: Decreased awareness of safety General Comments: good awareness of lines and leads, followed cues well today     Extremity Assessment (includes Sensation/Coordination)  Upper Extremity Assessment: Generalized weakness  Lower Extremity Assessment: Defer to PT evaluation    ADLs  Overall ADL's : Needs assistance/impaired Eating/Feeding: Supervision/ safety, Set up, Sitting Eating/Feeding Details (indicate cue type and reason): mod verbal cues to slow down, chew and swallow before taking next bite of food Grooming: Min guard, Cueing for sequencing, Cueing for safety, Wash/dry hands, Wash/dry face, Standing Grooming Details (indicate cue type and reason): Pt sitting at EOB with Min Guard A to perform oral care. Noting undershooting as pt would reach for swab or cup. Pt denies double vision. Pt requiring cues for sequencing.  Upper Body Bathing: Min guard, Sitting Lower Body Bathing: Minimal assistance, +2 for safety/equipment, Sit to/from stand Upper Body Dressing : Min guard, Sitting Lower Body Dressing: Moderate assistance, Sit to/from stand Lower Body Dressing Details (indicate cue type and reason): Mod A due to poor coorindation and vision for donning socks Toilet Transfer: Minimal assistance, Stand-pivot, Cueing for sequencing, Cueing for safety Toilet Transfer Details (indicate cue type and reason): simulated with transfer from eob to recliner. cues for hand placement on arm rests Toileting- Clothing Manipulation and Hygiene: Minimal assistance, Sit to/from stand Toileting - Clothing Manipulation Details (indicate cue type and reason): simulated during transfer Functional mobility during ADLs: Minimal assistance, Cueing for sequencing, Cueing for safety General ADL Comments:  notable delay with verbal response and physical initiation of tasks. multiple verbal and tactile stimulus required for task intitiation and completion.      Mobility  Overal bed mobility: Needs Assistance Bed Mobility: Rolling, Sidelying to Sit Rolling: Supervision Sidelying to sit: Supervision Supine to sit: Min guard Sit to supine: Min guard General bed mobility comments: OOB in chair     Transfers  Overall transfer level: Needs assistance Equipment used: Rolling walker (2 wheeled) Transfers: Sit to/from Stand Sit to Stand: Min guard Stand pivot transfers: Min assist General transfer comment: min guard for safety, cues for hand placement     Ambulation / Gait / Stairs / Wheelchair Mobility  Ambulation/Gait Ambulation/Gait assistance: Counsellor (Feet): 100 Feet Assistive device: None Gait Pattern/deviations: Step-through pattern, Drifts right/left, Wide base of support General Gait Details: attempted gait without assistive device today, able to gait train approximately 146ft without assistive device but does demonstrate wide BOS; VSS during gait on 2LPM O2  Gait velocity: decreased    Posture / Balance Balance Overall balance assessment: Mild deficits observed, not formally tested Sitting-balance support: No upper  extremity supported, Feet supported Sitting balance-Leahy Scale: Good Standing balance support: During functional activity, Bilateral upper extremity supported Standing balance-Leahy Scale: Fair Standing balance comment: min guard for safety for gait without device     Special needs/care consideration BiPAP/CPAP No CPM No Continuous Drip IV Heparin drip Dialysis Yes        Days T-TH-SAT Life Vest No Oxygen Yes, on 02 2L  here in hospital and at home Special Bed No Trach Size No Wound Vac (area) No       Skin Coccyx wound, left neck dressing, left arm dressing                             Bowel mgmt: Last BM 04/26/18 Bladder mgmt:  Anuria Diabetic mgmt Yes, on insulin now in hospital    Previous Home Environment Living Arrangements: Other relatives Type of Home: [place]: No Additional Comments: Patient vauge with responses for home set up and PLOF. Pt reporting he lives with to aunts.  Discharge Living Setting Plans for Discharge Living Setting: House, Lives with (comment)(Lives with aunt and uncle) Type of Home at Discharge: House Discharge Home Layout: One level Discharge Home Access: Stairs to enter Entrance Stairs-Number of Steps: 3-4 steps Discharge Bathroom Shower/Tub: Tub/shower unit, Curtain Discharge Bathroom Toilet: Standard Discharge Bathroom Accessibility: Yes How Accessible: Accessible via walker Does the patient have any problems obtaining your medications?: No  Social/Family/Support Systems Patient Roles: Other (Comment)(Has aunt and uncle) Contact Information: Roxanne Gates - uncle - [phone] Anticipated Caregiver: Aunt and uncle Anticipated Caregiver's Contact Information: Clint Guy - aunt - [phone] Ability/Limitations of Caregiver: Elenor Legato and Barbaraann Rondo are able to provide supervision. Caregiver Availability: 24/7 Discharge Plan Discussed with Primary Caregiver: Yes Is Caregiver In Agreement with Plan?: Yes Does Caregiver/Family have Issues with Lodging/Transportation while Pt is in Rehab?: No  Goals/Additional Needs Patient/Family Goal for Rehab: PT/OT mod I and supervision, SLP mod I goals Expected length of stay: 11-18 days Cultural Considerations: None Dietary Needs: Renal, carb mod, fluid restriction 2000 mL/day Equipment Needs: TBD Special Service Needs: HD T-TH-Sat, clip process is complete with outpatient HD set up Pt/Family Agrees to Admission and willing to participate: Yes(Spoke with aunt and uncle) Program Orientation Provided & Reviewed with Pt/Caregiver Including Roles  & Responsibilities: Yes  Decrease burden of Care through IP rehab  admission: N/A  Possible need for SNF placement upon discharge: Not anticipated  Patient Condition: This patient's medical and functional status has changed since the consult dated: 04/18/18 in which the Rehabilitation Physician determined and documented that the patient's condition is appropriate for intensive rehabilitative care in an inpatient rehabilitation facility. See "History of Present Illness" (above) for medical update. Functional changes are: Currently requiring min to min guard 100 feet no device and min assist overall with ADLs. Patient's medical and functional status update has been discussed with the Rehabilitation physician and patient remains appropriate for inpatient rehabilitation. Will admit to inpatient rehab today.  Preadmission Screen Completed By:  Retta Diones, 04/26/2018 3:30 PM ______________________________________________________________________   Discussed status with Dr. Posey Pronto on 04/26/18 at 1530 and received telephone approval for admission today.  Admission Coordinator:  Retta Diones, time 1530/Date 04/26/18

## 2018-04-26 NOTE — Anesthesia Postprocedure Evaluation (Signed)
Anesthesia Post Note  Patient: Travis Bryan  Procedure(s) Performed: INSERTION OF TUNNELED DIALYSIS CATHETER (Right Neck) ARTERIOVENOUS (AV) VS GRAFT ARM (Left Arm Upper) Attempted Exchange Of A Dialysis Catheter on the Left side (Left Neck)     Patient location during evaluation: PACU Anesthesia Type: General Level of consciousness: awake and alert Pain management: pain level controlled Vital Signs Assessment: post-procedure vital signs reviewed and stable Respiratory status: spontaneous breathing, nonlabored ventilation, respiratory function stable and patient connected to nasal cannula oxygen Cardiovascular status: blood pressure returned to baseline and stable Postop Assessment: no apparent nausea or vomiting Anesthetic complications: no    Last Vitals:  Vitals:   04/26/18 0346 04/26/18 0600  BP: 114/72   Pulse: 93   Resp: 19   Temp: 36.7 C   SpO2: 93% 99%    Last Pain:  Vitals:   04/26/18 0346  TempSrc: Oral  PainSc:                  [place] S

## 2018-04-26 NOTE — Discharge Summary (Addendum)
DISCHARGE SUMMARY  Travis Bryan  MR#: [id number]  DOB:Oct 11, 1970  Date of Admission: 03/28/2018 Date of Discharge: 04/26/2018  Attending Physician:Travis Pudwill Hennie Duos, MD  Patient's YCX:[id number], Lonia Mad, MD  Consults: Nephrology  [facility] PCCM Cardiology   Disposition: D/C to CIR   Follow-Bryan Appts: Follow-Bryan Travis Bryan.   Why:  use pharmacy for medication discounts Contact information: Travis Bryan [address] [phone]       [facility] Follow Bryan on 05/13/2018.   Why:  3:30  for Bryan follow Bryan apt Contact information: [address] Travis Bryan [address]       Travis Mitchell, MD In 6 weeks.   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 PEA arrest Human metapneumovirus pneumonia HCAP ARDS Paroxysmal atrial fibrillation  Patient continues to have runs of atrial fibrillation Nonischemic cardiomyopathy status post ICD Type 2 diabetes on insulin Hyperlipidemia Gout  Initial presentation: 48 year old with past medical history relevant for hypertension, stage IV CKD, nonischemic chronic systolic CHF (EF of 25 to 30%) status post AICD, paroxysmal atrial fibrillation status post DC cardioversion and ablation, type 2 diabetes on insulin, hypertension, hyperlipidemia, and gout who was admitted on 03/28/2018 with chills, nausea, and vomiting.  Bryan Bryan was complicated by acute hypoxic respiratory failure and subsequent CODE BLUE on 04/01/2018 due to likely human metapneumovirus pneumonia with superimposed bacterial HCAP w/ ARDS  as well as new ESRD.  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 -s/p AV fistula placement by [facility] 04/25/2018 as well as tunneled/PermCath -Nephrology followed and attended to HD  -Pending outpatient dialysis bed as per Nephrology   PEA arrest Felt to be respiratory in etiology   Human Metapneumovirus pneumonia / HCAP / ARDS completed a Bryan of antibiotics - requiring only 2 L nasal cannula for respiratory support - cont to wean O2 as able   Paroxysmal atrial fibrillation status post cardioversion and ablation -continues to have runs of atrial fibrillation -Continue amiodarone 200 mg daily -Continue heparin drip w/ transition to warfarin being directed by Pharmacy   Nonischemic cardiomyopathy status post ICD Volume status being managed with dialysis   Type 2 diabetes on insulin CBG reasonably controlled - as intake increases will likely require further insulin titration   Hyperlipidemia -Continue statin  Gout -Continue allopurinol  Medications at time of Transfer to CIR   Current Facility-Administered Medications:  .  acetaminophen (TYLENOL) tablet 650 mg, 650 mg, Oral, Q6H PRN, Travis Catching T, MD .  albuterol (PROVENTIL) (2.5 MG/3ML) 0.083% nebulizer solution 2.5 mg, 2.5 mg, Nebulization, Q2H PRN, Travis Altes, MD .  allopurinol (ZYLOPRIM) tablet 100 mg, 100 mg, Oral, Daily, Travis Slate M, PA-C, 100 mg at 04/26/18 0946 .  amiodarone (PACERONE) tablet 200 mg, 200 mg, Oral, Daily, Travis Slate M, PA-C, 200 mg at 04/26/18 0946 .  atorvastatin (LIPITOR) tablet 40 mg, 40 mg, Oral, q1800, Travis Amor, PA-C, 40 mg at 04/25/18 2045 .  bisacodyl (DULCOLAX) suppository 10 mg, 10 mg,  Rectal, Daily PRN, Travis Amor, PA-C, 10 mg at 04/06/18 0049 .  Chlorhexidine Gluconate Cloth 2 % PADS 6 each, 6 each, Topical, Q0600, Travis Amor, PA-C, 6 each at 04/26/18  [phone] .  Chlorhexidine Gluconate Cloth 2 % PADS 6 each, 6 each, Topical, Q0600, Travis Mend, MD .  diphenhydrAMINE (BENADRYL) capsule 25 mg, 25 mg, Oral, Q6H PRN, Travis Altes, MD .  feeding supplement (NEPRO CARB STEADY) liquid 237 mL, 237 mL, Oral, Q24H, Bryan, Travis M, PA-C, Last Rate: 0 mL/hr at 04/24/18 1600, 237 mL at 04/26/18 1310 .  heparin ADULT infusion 100 units/mL (25000 units/248mL sodium chloride 0.45%), 1,450 Units/hr, Intravenous, Continuous, Travis Bryan, Travis Bryan, Last Rate: 14.5 mL/hr at 04/26/18 0935, 1,450 Units/hr at 04/26/18 0935 .  HYDROcodone-acetaminophen (NORCO/VICODIN) 5-325 MG per tablet 1-2 tablet, 1-2 tablet, Oral, Q6H PRN, Travis Altes, MD .  insulin aspart (novoLOG) injection 0-9 Units, 0-9 Units, Subcutaneous, TID WC, Bryan, Travis M, PA-C, 3 Units at 04/26/18 1146 .  insulin glargine (LANTUS) injection 22 Units, 22 Units, Subcutaneous, QHS, Travis Bryan, Travis Nearing, MD .  lip balm (CARMEX) ointment, , Topical, PRN, Travis Amor, PA-C, 1 application at 71/69/67 1341 .  MEDLINE mouth rinse, 15 mL, Mouth Rinse, BID, Bryan, Travis M, PA-C, 15 mL at 04/24/18 1042 .  nystatin (MYCOSTATIN) 100000 UNIT/ML suspension 500,000 Units, 5 mL, Oral, QID, Travis Amor, PA-C, 500,000 Units at 04/26/18 1310 .  ondansetron (ZOFRAN) injection 4 mg, 4 mg, Intravenous, Q6H PRN, 4 mg at 04/22/18 1539 **OR** [DISCONTINUED] ondansetron (ZOFRAN) tablet 4 mg, 4 mg, Oral, Q6H PRN, Travis Course, MD .  polyethylene glycol (MIRALAX / GLYCOLAX) packet 17 g, 17 g, Oral, Daily, Travis Slate M, PA-C, 17 g at 04/26/18 0945 .  RESOURCE THICKENUP CLEAR, , Oral, PRN, Bryan, Travis M, PA-C .  senna-docusate (Senokot-S) tablet 1 tablet, 1 tablet, Oral, BID, Travis Bryan, Travis Bryan, 1 tablet at 04/26/18 8938 .  sevelamer carbonate (RENVELA) tablet 800 mg, 800 mg, Oral, TID WC, Bryan, Travis M, PA-C, 800 mg at 04/26/18 1147 .  sodium chloride flush (NS) 0.9 % injection 10-40 mL, 10-40 mL,  Intracatheter, Q12H, Bryan, Travis M, PA-C, 10 mL at 04/25/18 2222 .  sodium chloride flush (NS) 0.9 % injection 10-40 mL, 10-40 mL, Intracatheter, PRN, Travis Slate M, PA-C, 10 mL at 04/25/18 1010 .  warfarin (COUMADIN) tablet 7.5 mg, 7.5 mg, Oral, ONCE-1800, Travis Bryan, RPH .  Warfarin - Pharmacist Dosing Inpatient, , Does not apply, q1800, Travis Amor, PA-C, Stopped at 04/22/18 1800  Day of Discharge BP 104/72 (BP Location: Right Arm)   Pulse 87   Temp 98.3 F (36.8 C) (Axillary)   Resp (!) 23   Ht 5\' 10"  (1.778 Bryan)   Wt 99.2 kg   SpO2 92%   BMI 31.38 kg/Bryan   Physical Exam: General: No acute respiratory distress Lungs: Clear to auscultation bilaterally without wheezes or crackles Cardiovascular: Regular rate and rhythm without murmur Abdomen: Nontender, nondistended, soft, bowel sounds positive, no rebound, no ascites, no appreciable mass Extremities: No significant cyanosis, clubbing, or edema bilateral lower extremities  Basic Metabolic Panel: Recent Labs  Lab 04/22/18 0553 04/23/18 0703 04/24/18 0638 04/25/18 0739 04/26/18 0305  NA 134* 133* 131* 129* 135  K 3.6 4.0 4.0 4.6 3.9  CL 97* 96* 97* 92* 100  CO2 18* 17* 22 17* 25  GLUCOSE 325* 229* 185* 186* 117*  BUN 79* 89* 53* 64* 26*  CREATININE 12.68* 15.07* 11.34* 13.49* 7.58*  CALCIUM 9.4 9.5 9.0 9.1 8.6*  PHOS 5.6* 6.8* 4.9* 5.8* 4.9*    Liver Function Tests: Recent Labs  Lab 04/22/18 0553 04/23/18 0703 04/24/18 0638 04/25/18 0739 04/26/18 0305  ALBUMIN 2.3* 2.4* 2.5* 2.3* 2.1*    Coags: Recent Labs  Lab 04/23/18 0703 04/24/18 0638 04/25/18 0306 04/25/18 0739 04/26/18 0305  INR 1.41 1.39 1.09 1.30 1.40    CBC: Recent Labs  Lab 04/23/18 0703 04/24/18 0638 04/25/18 0306 04/25/18 0739 04/26/18 0305  WBC 5.1 6.1 8.4 7.1 7.3  HGB 10.9* 10.7* 8.7* 10.7* 9.8*  HCT 35.5* 34.2* 29.3* 34.8* 31.9*  MCV 92.2 92.2 89.6 91.3 93.0  PLT 309 302 336 288 272    CBG: Recent Labs  Lab  04/25/18 1007 04/25/18 1308 04/25/18 2034 04/26/18 0725 04/26/18 1143  GLUCAP 192* 149* 102* 114* 210*    Time spent in discharge (includes decision making & examination of pt): 35 minutes  04/26/2018, 2:40 PM   Travis Altes, MD [facility] Office  [phone]

## 2018-04-26 NOTE — Progress Notes (Signed)
[facility]  Subjective  - No complaints.  Used RIJ tunneled catheter yesteray.   Objective 110/68 85 98.9 F (37.2 C) (Axillary) 18 100%  Intake/Output Summary (Last 24 hours) at 04/26/2018 1145 Last data filed at 04/26/2018 1000 Gross per 24 hour  Intake 912.79 ml  Output 1264 ml  Net -351.21 ml    RIJ tunneled catheter Left 1st stage brachiobasilic fistula with good thrill  Laboratory Lab Results: Recent Labs    04/25/18 0739 04/26/18 0305  WBC 7.1 7.3  HGB 10.7* 9.8*  HCT 34.8* 31.9*  PLT 288 272   BMET Recent Labs    04/25/18 0739 04/26/18 0305  NA 129* 135  K 4.6 3.9  CL 92* 100  CO2 17* 25  GLUCOSE 186* 117*  BUN 64* 26*  CREATININE 13.49* 7.58*  CALCIUM 9.1 8.6*    COAG Lab Results  Component Value Date   INR 1.40 04/26/2018   INR 1.30 04/25/2018   INR 1.09 04/25/2018   No results found for: PTT  Assessment/Planning:  POD#1 s/p left brachiobasilic fistula with good thrill and RIJ tunneled catheter.  OK to restart heparin today - no bolus.  Will arrange follow-up in one month in [facility] clinic with fistula duplex and will ultimately need second stage in the future if fistula matures.  Marty Heck 04/26/2018 11:45 AM --

## 2018-04-26 NOTE — Progress Notes (Signed)
Physical Therapy Treatment Patient Details Name: Travis Bryan MRN: [id number] DOB: 1970-12-24 Today's Date: 04/26/2018    History of Present Illness 48 y.o. male admitted with pneumonia and decompensated heart failure. PEA arrest on 1/20, intubated 1/20-2/04.  PMH includes but not limited to: HTN, HLD, CKD III, A fib on coumadin, AICD placement, MI, DM2.     PT Comments    Patient received up in chair, sleeping but easily woken and willing to participate in PT, note ongoing flat affect but very pleasant and cooperative with PT. Noted SpO2 on room air 86% so replaced supplemental O2 at 2LPM and Spo2 quickly recovered to 90s, VSS throughout remainder of session on supplemental O2. Able to complete functional transfers with min guard, participated in marching and forwards/backward stepping with min guard and no device and fair-good balance, then able to gait train approximately 166f in hallway with no device and min guard. He was left up in the chair with all needs met and RN aware of patient status. He continues to progress well with skilled therapy services.      Follow Up Recommendations  CIR     Equipment Recommendations  (TBD )    Recommendations for Other Services       Precautions / Restrictions Precautions Precautions: Fall Precaution Comments: watch O2 Restrictions Weight Bearing Restrictions: No    Mobility  Bed Mobility       General bed mobility comments: OOB in chair   Transfers Overall transfer level: Needs assistance Equipment used: Rolling walker (2 wheeled) Transfers: Sit to/from Stand Sit to Stand: Min guard Stand pivot transfers: Min assist       General transfer comment: min guard for safety, cues for hand placement   Ambulation/Gait Ambulation/Gait assistance: Min guard Gait Distance (Feet): 100 Feet Assistive device: None Gait Pattern/deviations: Step-through pattern;Drifts right/left;Wide base of support Gait velocity: decreased   General  Gait Details: attempted gait without assistive device today, able to gait train approximately 1018fwithout assistive device but does demonstrate wide BOS; VSS during gait on 2LPM O2    Stairs             Wheelchair Mobility    Modified Rankin (Stroke Patients Only)       Balance Overall balance assessment: Mild deficits observed, not formally tested   Sitting balance-Leahy Scale: Good     Standing balance support: During functional activity;Bilateral upper extremity supported Standing balance-Leahy Scale: Fair Standing balance comment: min guard for safety for gait without device                             Cognition Arousal/Alertness: Awake/alert Behavior During Therapy: Flat affect Overall Cognitive Status: Impaired/Different from baseline Area of Impairment: Safety/judgement;Awareness;Attention                   Current Attention Level: Sustained Memory: Decreased short-term memory Following Commands: Follows one step commands with increased time;Follows multi-step commands consistently;Follows multi-step commands with increased time Safety/Judgement: Decreased awareness of safety Awareness: Emergent Problem Solving: Slow processing;Decreased initiation;Requires verbal cues General Comments: good awareness of lines and leads, followed cues well today       Exercises      General Comments        Pertinent Vitals/Pain Pain Assessment: 0-10 Pain Score: 6  Faces Pain Scale: Hurts whole lot Pain Location: L elbow Pain Descriptors / Indicators: Aching;Sore Pain Intervention(s): Limited activity within patient's tolerance;Monitored during session  Home Living                      Prior Function            PT Goals (current goals can now be found in the care plan section) Acute Rehab PT Goals Patient Stated Goal: non stated PT Goal Formulation: With patient Time For Goal Achievement: 05/01/18 Potential to Achieve Goals:  Good Progress towards PT goals: Progressing toward goals    Frequency    Min 3X/week      PT Plan Current plan remains appropriate    Co-evaluation              AM-PAC PT "6 Clicks" Mobility   Outcome Measure  Help needed turning from your back to your side while in a flat bed without using bedrails?: None Help needed moving from lying on your back to sitting on the side of a flat bed without using bedrails?: A Little Help needed moving to and from a bed to a chair (including a wheelchair)?: A Little Help needed standing up from a chair using your arms (e.g., wheelchair or bedside chair)?: A Little Help needed to walk in hospital room?: A Little Help needed climbing 3-5 steps with a railing? : A Little 6 Click Score: 19    End of Session Equipment Utilized During Treatment: Gait belt;Oxygen Activity Tolerance: Patient tolerated treatment well Patient left: in chair;with call bell/phone within reach Nurse Communication: Mobility status PT Visit Diagnosis: Unsteadiness on feet (R26.81)     Time: 6244-6950 PT Time Calculation (min) (ACUTE ONLY): 25 min  Charges:  $Gait Training: 8-22 mins $Therapeutic Activity: 8-22 mins                     Deniece Ree PT, DPT, CBIS  Supplemental Physical Therapist [place]    Pager [phone] Acute Rehab Office [phone]

## 2018-04-26 NOTE — Discharge Instructions (Signed)
Information on my medicine - Coumadin   (Warfarin)  This medication education was reviewed with me or my healthcare representative as part of my discharge preparation.   You were taking couamdin (warfarin) prior to this hospitalization.  Warfarin continued. Why was Coumadin prescribed for you? Coumadin was prescribed for you because you have a blood clot or a medical condition that can cause an increased risk of forming blood clots. Blood clots can cause serious health problems by blocking the flow of blood to the heart, lung, or brain. Coumadin can prevent harmful blood clots from forming. As a reminder your indication for Coumadin is:   Blood Clotting Disorder  What test will check on my response to Coumadin? While on Coumadin (warfarin) you will need to have an INR test regularly to ensure that your dose is keeping you in the desired range. The INR (international normalized ratio) number is calculated from the result of the laboratory test called prothrombin time (PT).  If an INR APPOINTMENT HAS NOT ALREADY BEEN MADE FOR YOU please schedule an appointment to have this lab work done by your health care provider within 7 days. Your INR goal is usually a number between:  2 to 3 or your provider may give you a more narrow range like 2-2.5.  Ask your health care provider during an office visit what your goal INR is.  What  do you need to  know  About  COUMADIN? Take Coumadin (warfarin) exactly as prescribed by your healthcare provider about the same time each day.  DO NOT stop taking without talking to the doctor who prescribed the medication.  Stopping without other blood clot prevention medication to take the place of Coumadin may increase your risk of developing a new clot or stroke.  Get refills before you run out.  What do you do if you miss a dose? If you miss a dose, take it as soon as you remember on the same day then continue your regularly scheduled regimen the next day.  Do not take two  doses of Coumadin at the same time.  Important Safety Information A possible side effect of Coumadin (Warfarin) is an increased risk of bleeding. You should call your healthcare provider right away if you experience any of the following: ? Bleeding from an injury or your nose that does not stop. ? Unusual colored urine (red or dark brown) or unusual colored stools (red or black). ? Unusual bruising for unknown reasons. ? A serious fall or if you hit your head (even if there is no bleeding).  Some foods or medicines interact with Coumadin (warfarin) and might alter your response to warfarin. To help avoid this: ? Eat a balanced diet, maintaining a consistent amount of Vitamin K. ? Notify your provider about major diet changes you plan to make. ? Avoid alcohol or limit your intake to 1 drink for women and 2 drinks for men per day. (1 drink is 5 oz. wine, 12 oz. beer, or 1.5 oz. liquor.)  Make sure that ANY health care provider who prescribes medication for you knows that you are taking Coumadin (warfarin).  Also make sure the healthcare provider who is monitoring your Coumadin knows when you have started a new medication including herbals and non-prescription products.  Coumadin (Warfarin)  Major Drug Interactions  Increased Warfarin Effect Decreased Warfarin Effect  Alcohol (large quantities) Antibiotics (esp. Septra/Bactrim, Flagyl, Cipro) Amiodarone (Cordarone) Aspirin (ASA) Cimetidine (Tagamet) Megestrol (Megace) NSAIDs (ibuprofen, naproxen, etc.) Piroxicam (Feldene) Propafenone (Rythmol SR)  Propranolol (Inderal) Isoniazid (INH) Posaconazole (Noxafil) Barbiturates (Phenobarbital) Carbamazepine (Tegretol) Chlordiazepoxide (Librium) Cholestyramine (Questran) Griseofulvin Oral Contraceptives Rifampin Sucralfate (Carafate) Vitamin K   Coumadin (Warfarin) Major Herbal Interactions  Increased Warfarin Effect Decreased Warfarin Effect  Garlic Ginseng Ginkgo biloba Coenzyme  Q10 Green tea St. Johns wort    Coumadin (Warfarin) FOOD Interactions  Eat a consistent number of servings per week of foods HIGH in Vitamin K (1 serving =  cup)  Collards (cooked, or boiled & drained) Kale (cooked, or boiled & drained) Mustard greens (cooked, or boiled & drained) Parsley *serving size only =  cup Spinach (cooked, or boiled & drained) Swiss chard (cooked, or boiled & drained) Turnip greens (cooked, or boiled & drained)  Eat a consistent number of servings per week of foods MEDIUM-HIGH in Vitamin K (1 serving = 1 cup)  Asparagus (cooked, or boiled & drained) Broccoli (cooked, boiled & drained, or raw & chopped) Brussel sprouts (cooked, or boiled & drained) *serving size only =  cup Lettuce, raw (green leaf, endive, romaine) Spinach, raw Turnip greens, raw & chopped   These websites have more information on Coumadin (warfarin):  FailFactory.se; VeganReport.com.au;    [facility]  Discharge Instructions  AV Fistula or Graft Surgery for Dialysis Access  Please refer to the following instructions for your post-procedure care. Your surgeon or physician assistant will discuss any changes with you.  Activity  You may drive the day following your surgery, if you are comfortable and no longer taking prescription pain medication. Resume full activity as the soreness in your incision resolves.  Bathing/Showering  You may shower after you go home. Keep your incision dry for 48 hours. Do not soak in a bathtub, hot tub, or swim until the incision heals completely. You may not shower if you have a hemodialysis catheter.  Incision Care  Clean your incision with mild soap and water after 48 hours. Pat the area dry with a clean towel. You do not need a bandage unless otherwise instructed. Do not apply any ointments or creams to your incision. You may have skin glue on your incision. Do not peel it off. It will come off  on its own in about one week. Your arm may swell a bit after surgery. To reduce swelling use pillows to elevate your arm so it is above your heart. Your doctor will tell you if you need to lightly wrap your arm with an ACE bandage.  Diet  Resume your normal diet. There are not special food restrictions following this procedure. In order to heal from your surgery, it is CRITICAL to get adequate nutrition. Your body requires vitamins, minerals, and protein. Vegetables are the best source of vitamins and minerals. Vegetables also provide the perfect balance of protein. Processed food has little nutritional value, so try to avoid this.  Medications  Resume taking all of your medications. If your incision is causing pain, you may take over-the counter pain relievers such as acetaminophen (Tylenol). If you were prescribed a stronger pain medication, please be aware these medications can cause nausea and constipation. Prevent nausea by taking the medication with a snack or meal. Avoid constipation by drinking plenty of fluids and eating foods with high amount of fiber, such as fruits, vegetables, and grains.  Do not take Tylenol if you are taking prescription pain medications.  Follow up Your surgeon may want to see you in the office following your access surgery. If so, this will be arranged at the  time of your surgery.  Please call us immediately for any of the following conditions:  Increased pain, redness, drainage (pus) from your incision site Fever of 101 degrees or higher Severe or worsening pain at your incision site Hand pain or numbness.  Reduce your risk of vascular disease:  Stop smoking. If you would like help, call [facility] at 1-800-QUIT-NOW 410-225-1775) or [place] at [place] your cholesterol Maintain a desired weight Control your diabetes Keep your blood pressure down  Dialysis  It will take several weeks to several months for your new dialysis access to  be ready for use. Your surgeon will determine when it is okay to use it. Your nephrologist will continue to direct your dialysis. You can continue to use your Permcath until your new access is ready for use.   04/26/2018 Travis Bryan [id number] 11-May-1970  Surgeon(s): Serafina Mitchell, MD Marty Heck, MD  Procedure(s): INSERTION OF TUNNELED DIALYSIS CATHETER 1st stage left basilic vein transposition Attempted Exchange Of A Dialysis Catheter on the Left side  x Do not stick fistula for 12 weeks    If you have any questions, please call the office at [phone].

## 2018-04-26 NOTE — Progress Notes (Signed)
Pt skin assessment completed with Loree Fee, RN. Pt has stage 2 to coccyx, area to be cleaned and foam applied. Pt has several abrasions noted to forehead, L nare, and scrotum area. Barrier cream applied to scrotum abrasion. Will cont to monitor.  Erie Noe, LPN

## 2018-04-27 ENCOUNTER — Inpatient Hospital Stay (HOSPITAL_COMMUNITY): Payer: Self-pay | Admitting: Physical Therapy

## 2018-04-27 ENCOUNTER — Inpatient Hospital Stay (HOSPITAL_COMMUNITY): Payer: Self-pay

## 2018-04-27 ENCOUNTER — Inpatient Hospital Stay (HOSPITAL_COMMUNITY): Payer: Self-pay | Admitting: Occupational Therapy

## 2018-04-27 ENCOUNTER — Inpatient Hospital Stay (HOSPITAL_COMMUNITY): Payer: Medicaid Other

## 2018-04-27 DIAGNOSIS — G8929 Other chronic pain: Secondary | ICD-10-CM

## 2018-04-27 DIAGNOSIS — N186 End stage renal disease: Secondary | ICD-10-CM

## 2018-04-27 DIAGNOSIS — Z992 Dependence on renal dialysis: Secondary | ICD-10-CM

## 2018-04-27 DIAGNOSIS — D638 Anemia in other chronic diseases classified elsewhere: Secondary | ICD-10-CM

## 2018-04-27 DIAGNOSIS — R1031 Right lower quadrant pain: Secondary | ICD-10-CM

## 2018-04-27 DIAGNOSIS — R5381 Other malaise: Principal | ICD-10-CM

## 2018-04-27 LAB — CBC
HCT: 30 % — ABNORMAL LOW (ref 39.0–52.0)
Hemoglobin: 9.3 g/dL — ABNORMAL LOW (ref 13.0–17.0)
MCH: 28.9 pg (ref 26.0–34.0)
MCHC: 31 g/dL (ref 30.0–36.0)
MCV: 93.2 fL (ref 80.0–100.0)
Platelets: 260 10*3/uL (ref 150–400)
RBC: 3.22 MIL/uL — ABNORMAL LOW (ref 4.22–5.81)
RDW: 15.7 % — ABNORMAL HIGH (ref 11.5–15.5)
WBC: 6 10*3/uL (ref 4.0–10.5)
nRBC: 0 % (ref 0.0–0.2)

## 2018-04-27 LAB — RENAL FUNCTION PANEL
Albumin: 2.1 g/dL — ABNORMAL LOW (ref 3.5–5.0)
Albumin: 2.2 g/dL — ABNORMAL LOW (ref 3.5–5.0)
Anion gap: 10 (ref 5–15)
Anion gap: 13 (ref 5–15)
BUN: 26 mg/dL — ABNORMAL HIGH (ref 6–20)
BUN: 47 mg/dL — ABNORMAL HIGH (ref 6–20)
CHLORIDE: 100 mmol/L (ref 98–111)
CHLORIDE: 93 mmol/L — AB (ref 98–111)
CO2: 25 mmol/L (ref 22–32)
CO2: 22 mmol/L (ref 22–32)
Calcium: 8.6 mg/dL — ABNORMAL LOW (ref 8.9–10.3)
Calcium: 8.7 mg/dL — ABNORMAL LOW (ref 8.9–10.3)
Creatinine, Ser: 7.58 mg/dL — ABNORMAL HIGH (ref 0.61–1.24)
Creatinine, Ser: 10.52 mg/dL — ABNORMAL HIGH (ref 0.61–1.24)
GFR calc Af Amer: 9 mL/min — ABNORMAL LOW (ref >60)
GFR calc Af Amer: 6 mL/min — ABNORMAL LOW (ref >60)
GFR calc non Af Amer: 8 mL/min — ABNORMAL LOW (ref >60)
GFR calc non Af Amer: 5 mL/min — ABNORMAL LOW (ref >60)
Glucose, Bld: 117 mg/dL — ABNORMAL HIGH (ref 70–99)
Glucose, Bld: 155 mg/dL — ABNORMAL HIGH (ref 70–99)
POTASSIUM: 4 mmol/L (ref 3.5–5.1)
Phosphorus: 4.9 mg/dL — ABNORMAL HIGH (ref 2.5–4.6)
Phosphorus: 4.2 mg/dL (ref 2.5–4.6)
Potassium: 3.9 mmol/L (ref 3.5–5.1)
Sodium: 135 mmol/L (ref 135–145)
Sodium: 128 mmol/L — ABNORMAL LOW (ref 135–145)

## 2018-04-27 LAB — FERRITIN: Ferritin: 724 ng/mL — ABNORMAL HIGH (ref 24–336)

## 2018-04-27 LAB — IRON AND TIBC
Iron: 23 ug/dL — ABNORMAL LOW (ref 45–182)
Saturation Ratios: 12 % — ABNORMAL LOW (ref 17.9–39.5)
TIBC: 197 ug/dL — AB (ref 250–450)
UIBC: 174 ug/dL

## 2018-04-27 LAB — GLUCOSE, CAPILLARY
Glucose-Capillary: 178 mg/dL — ABNORMAL HIGH (ref 70–99)
Glucose-Capillary: 142 mg/dL — ABNORMAL HIGH (ref 70–99)
Glucose-Capillary: 136 mg/dL — ABNORMAL HIGH (ref 70–99)
Glucose-Capillary: 80 mg/dL (ref 70–99)

## 2018-04-27 LAB — PROTIME-INR
INR: 1.34
Prothrombin Time: 16.4 seconds — ABNORMAL HIGH (ref 11.4–15.2)

## 2018-04-27 LAB — HEPARIN LEVEL (UNFRACTIONATED): Heparin Unfractionated: 0.41 IU/mL (ref 0.30–0.70)

## 2018-04-27 MED ORDER — BOOST / RESOURCE BREEZE PO LIQD CUSTOM
1.0000 | Freq: Three times a day (TID) | ORAL | Status: DC
  Administered 2018-04-28 – 2018-05-01 (×6): 1 via ORAL

## 2018-04-27 MED ORDER — OXYCODONE HCL 5 MG PO TABS
5.0000 mg | ORAL_TABLET | Freq: Four times a day (QID) | ORAL | Status: DC | PRN
  Administered 2018-04-27: 5 mg via ORAL
  Administered 2018-04-30: 10 mg via ORAL
  Filled 2018-04-27: qty 1
  Filled 2018-04-27: qty 2

## 2018-04-27 MED ORDER — LIDOCAINE-PRILOCAINE 2.5-2.5 % EX CREA: 1.0000 "application " | TOPICAL_CREAM | CUTANEOUS | Status: DC | PRN

## 2018-04-27 MED ORDER — PENTAFLUOROPROP-TETRAFLUOROETH EX AERO: 1.0000 "application " | INHALATION_SPRAY | CUTANEOUS | Status: DC | PRN

## 2018-04-27 MED ORDER — SODIUM CHLORIDE 0.9 % IV SOLN: 100.0000 mL | INTRAVENOUS | Status: DC | PRN

## 2018-04-27 MED ORDER — ALTEPLASE 2 MG IJ SOLR: 2.0000 mg | Freq: Once | INTRAMUSCULAR | Status: DC | PRN

## 2018-04-27 MED ORDER — HEPARIN SODIUM (PORCINE) 1000 UNIT/ML DIALYSIS
1000.0000 [IU] | INTRAMUSCULAR | Status: DC | PRN
  Administered 2018-04-27: 1000 [IU] via INTRAVENOUS_CENTRAL

## 2018-04-27 MED ORDER — LIDOCAINE HCL (PF) 1 % IJ SOLN: 5.0000 mL | INTRAMUSCULAR | Status: DC | PRN

## 2018-04-27 MED ORDER — WARFARIN SODIUM 7.5 MG PO TABS
7.5000 mg | ORAL_TABLET | Freq: Once | ORAL | Status: AC
  Administered 2018-04-27: 7.5 mg via ORAL
  Filled 2018-04-27: qty 1

## 2018-04-27 MED ORDER — HEPARIN SODIUM (PORCINE) 1000 UNIT/ML IJ SOLN
INTRAMUSCULAR | Status: AC
  Administered 2018-04-27: 1000 [IU] via INTRAVENOUS_CENTRAL
  Filled 2018-04-27: qty 4

## 2018-04-27 NOTE — Progress Notes (Signed)
Pt at 2040 on 02/14 c/o feeling hungry, " I feel like my stomach is empty" pt denied nausea or pain. Pt was given snack. Pt stated it was " a little better." Pt called at 0230 stated " I feel like my belly is empty." Pt again denied nausea pt had BM at beginning of writers shift. Pt was given maalox and educated regarding medication.Pt was given a lite snack. Pt stated " I feel a little better now". 0515 pt vomited a large amount of thick emesis with undigested food present. Pt originally stated that he felt better. Oral care was done. Pt then stated that he felt "a little like before". Pt was given compazine IM and instructed to wait to eat or drink until nausea was gone. [name] abdomen is taut but pliable with hypoactive bowel sounds. Pt denies abdominal pain at this time. Pt called at [phone] and stated he was having right flank pain that radiates to his back. Pt was medicated with Norco. Dr. Naaman Plummer was updated on events that occurred on this shift. Orders were written

## 2018-04-27 NOTE — Progress Notes (Signed)
Initial Nutrition Assessment  DOCUMENTATION CODES:   Not applicable  INTERVENTION:  - Will order Boost Breeze TID, each supplement provides 250 kcal and 9 grams of protein. - Continue 30 ml Prostat BID, each packet provides 100 kcal and 15 grams of protein.  - Recommend renal multivitamin. - Will assess for appropriate time to provide diet education.    NUTRITION DIAGNOSIS:   Increased nutrient needs related to chronic illness(ESRD on HD) as evidenced by estimated needs.  GOAL:   Patient will meet greater than or equal to 90% of their needs  MONITOR:   PO intake, Supplement acceptance, Weight trends, Labs, Skin, I & O's  REASON FOR ASSESSMENT:   Consult Diet education  ASSESSMENT:   48 year old male with history of NICM/AICD, CHF, atrial flutter-on Coumadin, CKD III-IV, HTN, recent hospitalization for PNA. He was admitted on 03/28/2018 with low-grade fevers, nausea/vomiting, progressive dyspnea and lethargy. Work-up revealed acute on chronic renal failure and acute hypoxemic respiratory failure due to Metapneumovirus PNA in setting of acute on CHF. He continued to have worsening of renal status with poor urine output and CRRT initiated. He developed PEA arrest on 01/20 requiring CPR x14 minutes and intubation. He was extubated on 2/4 and CRRT discontinued but patient continued to have minimal urine output without improvement in renal status. HD initiated and nephrology felt that patient had progressed to ESRD;L AVF placed on 2/13.  BMI at this time indicates obesity. Patient admitted to rehab yesterday. He was feeling better yesterday but throughout the day today has been experiencing abdominal pain and nausea. He was able to eating 25-50% of breakfast but unable to eat anything for lunch d/t pain and nausea. He also experienced vomiting earlier, before lunch time.   Will defer education at this time and focus on management of symptoms and encouraging patient to eat something.  Will d/c Nepro and order Boost Breeze as this may be better tolerated with current symptoms.   Per notes, plan is for HD start on Tuesday (2/18).    Medications reviewed; sliding scale novolog, 22 units lantus/day, 5 ml mycostatin QID, 17 g miralax/day, 1 tablet senokot BID, 800 mg renvela TID. Labs reviewed; CBGs: 178 and 142 mg/dl today, Na: 128 mmol/l, Cl: 93 mmol/l, BUN: 47 mg/dl, creatinine: 10.52 mg/dl, Ca: 8.7 mg/dl, GFR: 6 ml/min. K and Phos WDL.       NUTRITION - FOCUSED PHYSICAL EXAM:  Completed; no muscle and no fat wasting, mild edema to all extremities.  Diet Order:   Diet Order            Diet renal/carb modified with fluid restriction Diet-HS Snack? Nothing; Fluid restriction: 1200 mL Fluid; Room service appropriate? Yes; Fluid consistency: Thin  Diet effective now              EDUCATION NEEDS:   Not appropriate for education at this time  Skin:  Skin Assessment: Skin Integrity Issues: Skin Integrity Issues:: Stage II, Other (Comment), Incisions Stage II: penis Incisions: neck (2/13) Other: L AVF (2/13)  Last BM:  2/14  Height:   Ht Readings from Last 1 Encounters:  04/26/18 5\' 10"  (1.778 m)    Weight:   Wt Readings from Last 1 Encounters:  04/27/18 101 kg    Ideal Body Weight:  75.45 kg  BMI:  Body mass index is 31.95 kg/m.  Estimated Nutritional Needs:   Kcal:  2225-2450 kcal  Protein:  110-125 grams  Fluid:  per MD     Jarome Matin, MS,  RD, LDN, CNSC Inpatient Clinical Dietitian Pager # [phone] After hours/weekend pager # [phone]

## 2018-04-27 NOTE — Progress Notes (Signed)
HD tx completed @ 2250 w/o problem UF goal met Blood rinsed back VSS Report called to Patrici Ranks, RN

## 2018-04-27 NOTE — Evaluation (Signed)
Speech Language Pathology Assessment and Plan  Patient Details  Name: Travis Bryan MRN: [id number] Date of Birth: Jun 01, 1970  SLP Diagnosis: Cognitive Impairments;Other (comment)  Rehab Potential: Good ELOS: 7-10 days     Today's Date: 04/27/2018 SLP Individual Time:  - [phone]. 60 mins     Problem List:  Patient Active Problem List   Diagnosis Date Noted  . Debility 04/26/2018  . Hypoxia   . Post-operative state   . Anemia of chronic disease   . PAF (paroxysmal atrial fibrillation) ([place])   . Pressure injury of skin 04/17/2018  . Bilateral pneumonia 03/29/2018  . Acute respiratory failure with hypoxia ([place]) 03/28/2018  . DKA (diabetic ketoacidosis) ([place]) 02/25/2018  . Acute on chronic systolic heart failure, NYHA class 4 ([place]) 02/25/2018  . Hyperlipidemia 02/25/2018  . Atrial fibrillation ([place]) 02/25/2018  . CAD (coronary artery disease) 02/25/2018  . Elevated troponin 02/25/2018  . Sepsis due to pneumonia ([place]) 02/25/2018  . AKI (acute kidney injury) ([place]) 02/25/2018  . Type 2 diabetes mellitus with complication, with long-term current use of insulin ([place]) 12/26/2017  . At risk for sexually transmitted disease due to unprotected sex 08/23/2017  . Healthcare maintenance 06/24/2016  . Persistent atrial fibrillation   . CARDIOMYOPATHY, SECONDARY 07/03/2008  . HFrEF (heart failure with reduced ejection fraction) ([place]) 07/03/2008  . Automatic implantable cardioverter-defibrillator in situ 07/03/2008  . Type 2 diabetes mellitus ([place]) 12/26/2005  . Hyperlipidemia associated with type 2 diabetes mellitus ([place]) 12/26/2005  . Hypertension associated with diabetes ([place]) 12/26/2005   Past Medical History:  Past Medical History:  Diagnosis Date  . AICD (automatic cardioverter/defibrillator) present   . Atrial flutter ([place])    a. s/p ablation 03/2016  . Chronic systolic CHF (congestive heart failure) ([place])   . HCAP (healthcare-associated pneumonia) 03/28/2018  . History of  gout   . Hyperlipidemia   . Hypertension   . Myocardial infarction [facility])    "I think I had one a long long time ago" (03/28/2016)  . NICM (nonischemic cardiomyopathy) ([place])    a. LHC 6/06: pLAD 20, pLCx 20-30; b. Echo 5/15:  EF 15%, diffuse HK, restrictive physiology, trivial AI, trivial MR, mild LAE, moderate RVE, moderately reduced RVSF, moderate RAE, mild to moderate TR, PASP 43 mmHg  . Obesity   . Persistent atrial fibrillation   . Type II diabetes mellitus ([place])    Past Surgical History:  Past Surgical History:  Procedure Laterality Date  . AV FISTULA PLACEMENT Left 04/25/2018   Procedure: ARTERIOVENOUS (AV) VS GRAFT ARM;  Surgeon: Serafina Mitchell, MD;  Location: [place];  Service: Vascular;  Laterality: Left;  . CARDIOVERSION N/A 04/07/2016   Procedure: CARDIOVERSION;  Surgeon: Thayer Headings, MD;  Location[facility] ENDOSCOPY;  Service: Cardiovascular;  Laterality: N/A;  . CARDIOVERSION N/A 04/11/2016   Procedure: CARDIOVERSION;  Surgeon: Larey Dresser, MD;  Location: [place];  Service: Cardiovascular;  Laterality: N/A;  . ELECTROPHYSIOLOGIC STUDY N/A 03/31/2016   Procedure: A-Flutter Ablation;  Surgeon: Evans Lance, MD;  Location: [place] CV LAB;  Service: Cardiovascular;  Laterality: N/A;  . EXCHANGE OF A DIALYSIS CATHETER Left 04/25/2018   Procedure: Attempted Exchange Of A Dialysis Catheter on the Left side;  Surgeon: Serafina Mitchell, MD;  Location[facility] OR;  Service: Vascular;  Laterality: Left;  . IMPLANTABLE CARDIOVERTER DEFIBRILLATOR (ICD) GENERATOR CHANGE N/A 08/27/2013   Procedure: ICD GENERATOR CHANGE;  Surgeon: Evans Lance, MD;  Location: [facility] CATH LAB;  Service: Cardiovascular;  Laterality: N/A;  .  INSERTION OF DIALYSIS CATHETER Right 04/25/2018   Procedure: INSERTION OF TUNNELED DIALYSIS CATHETER;  Surgeon: Serafina Mitchell, MD;  Location: [facility] OR;  Service: Vascular;  Laterality: Right;  . TEE WITHOUT CARDIOVERSION N/A 03/31/2016   Procedure: TRANSESOPHAGEAL  ECHOCARDIOGRAM (TEE);  Surgeon: Larey Dresser, MD;  Location: [facility] ENDOSCOPY;  Service: Cardiovascular;  Laterality: N/A;    Assessment / Plan / Recommendation Clinical Impression A 48 year old right-handed male with history of NICM/AICD, chronic systolic congestive heart failure, atrial flutter-on Coumadin, CKDIII-IV,HTN,recent hospitalization for pneumonia, who was admitted on 03/28/2018 with low-grade fevers,nausea/vomiting, progressive dyspnea and lethargy. He was started on BiPAP as well as broad spectrum antibiotics and diuretics. Work-up revealed acute on chronic renal failure as well as acute hypoxemic respiratory failure due to Metapneumovirus pneumoniain setting ofacute on chronic CHF.He continued to have worsening of renal status with poor urine output and CRRT initiated. He developed PEA arrest on 01//20 requiring CPR x14 minutes with ACLS protocol.He was intubated and has required pressors due to cardiogenic shock.  Hospital course significant for ARDS, H CAP versus aspiration pneumonia, coagulopathy requiring reversal of Coumadin with FFP,abdominal distention due to ileusandacute on chronic anemia.He tolerated extubation on 2/4 and CRRTdiscontinued but patient continued to have minimal without urine output and no improvement in renal status. Hemodialysis initiated and nephrology felt that patient had progressed to ESRD and Left AVF placed on 02/13 by Dr. Trula Slade.   Pt presents with mild delayed processing impacting higher level problem solving, emergent awareness,  judgement and working memory. Pt demonstrated WFL on formal cognitive linguistic asessment, Cognistat, in all areas except, mild impairment in construction task and judgement with severe impairment in mental calcutions, however pt reports baseline function. Further assessment of functional semi-complex problem solving and error awareness, in money management task pt required mod A verbal cues. Pt initially  denied any cognitive changes since hospitalization, although agreeing at end of session he is "slightly off" from baseline, suggesting possible baseline deficits in complex tasks, family not present to confirm or deny. Pt presents with mild impairments in speech with reduced vocal intensity in conversation, likely due to continue edema from prolonged intubation. Pt presents WFL swallow function and tolerance of regular and thin textured diet. Pt would benefit from skilled ST services in order to maximize functional independence and reduce burden of care, likely requiring supervision at discharge with continued skilled ST services.   Skilled Therapeutic Interventions          Skilled ST services focused on cognitive skills. SLP facilitated administration of cognitive linguistic formal assessment and provided education of results. SLP and pt colaborated to set goals for cognitive linguistic needs during length of stay. All questions answered to satisfaction.  Pt was left in room with call bell within reach and bed alarm set. SLP reccomends to continue skilled services.   SLP Assessment  Patient will need skilled Speech Lanaguage Pathology Services during CIR admission    Recommendations  SLP Diet Recommendations: Thin Liquid Administration via: Cup;Straw Medication Administration: Whole meds with liquid Supervision: Patient able to self feed Compensations: Slow rate;Small sips/bites Postural Changes and/or Swallow Maneuvers: Seated upright 90 degrees Oral Care Recommendations: Oral care BID Patient destination: Home Follow up Recommendations: 24 hour supervision/assistance;Home Health SLP;Outpatient SLP Equipment Recommended: None recommended by SLP    SLP Frequency 3 to 5 out of 7 days   SLP Duration  SLP Intensity  SLP Treatment/Interventions 7-10 days   Minumum of 1-2 x/day, 30 to 90 minutes  Cognitive remediation/compensation;Cueing hierarchy;Functional  tasks;Medication managment     Pain Pain Assessment Pain Scale: 0-10 Pain Score: 8  Pain Type: Acute pain Pain Location: Flank Pain Orientation: Right Pain Radiating Towards: back Pain Descriptors / Indicators: Aching;Discomfort Pain Frequency: Intermittent Pain Onset: Gradual Patients Stated Pain Goal: 3 Pain Intervention(s): Medication (See eMAR)  Prior Functioning Cognitive/Linguistic Baseline: Information not available Type of Home: House  Lives With: Family Available Help at Discharge: Family;Available 24 hours/day Education: high school Vocation: Part time employment  Short Term Goals: Week 1: SLP Short Term Goal 1 (Week 1): Pt will utilize speech intelligibility strategies (increase vocal intensity) in conversation mod I to achieve 90% intelligibility.  SLP Short Term Goal 2 (Week 1): Pt will complete semi-complex medication and money management task with supervision A verbal cues for functional problem solving.  SLP Short Term Goal 3 (Week 1): Pt will self-monitor and correct functional errors in functional problem solving tasks with min A verbal cues. SLP Short Term Goal 4 (Week 1): Pt will demonstrate 2 safety protocols associated with new limitations with supervision A verbal cues.   Refer to Care Plan for Long Term Goals  Recommendations for other services: None   Discharge Criteria: Patient will be discharged from SLP if patient refuses treatment 3 consecutive times without medical reason, if treatment goals not met, if there is a change in medical status, if patient makes no progress towards goals or if patient is discharged from hospital.  The above assessment, treatment plan, treatment alternatives and goals were discussed and mutually agreed upon: by patient  Tarri Guilfoil  [facility] 04/27/2018, 4:03 PM

## 2018-04-27 NOTE — Progress Notes (Signed)
HD tx initiated via HD cath w/o problem Bilat ports: pull/push/flush equally w/o problem VSS Will continue to monitor while on HD tx 

## 2018-04-27 NOTE — Progress Notes (Signed)
[facility]    NEPHROLOGY PROGRESS NOTE  SUBJECTIVE:  Moved to CIR yesterday.  No complaints this AM.  Permcath and L AVF (1st stage BB) Thrurs    OBJECTIVE:  Vitals:   04/26/18 2004 04/27/18 0519  BP: 94/69 127/78  Pulse: 87 83  Resp: 20 (!) 22  Temp: 98.9 F (37.2 C) 98.9 F (37.2 C)  SpO2: 100% 96%    Intake/Output Summary (Last 24 hours) at 04/27/2018 1233 Last data filed at 04/27/2018 0850 Gross per 24 hour  Intake 650 ml  Output 1 ml  Net 649 ml      Genearl:  AAOx3 NAD HEENT: MMM [place] AT anicteric sclera Neck:  No JVD, RIJ TDC c/d/i CV:  Heart RRR  Lungs:  L/S CTA bilaterally Abd:  abd SNT/ND with normal BS GU:  Bladder non-palpable Extremities:  No LE edema. LUE BB AVF thrill and bruit Skin:  No skin rash  MEDICATIONS:  . allopurinol  100 mg Oral Daily  . amiodarone  200 mg Oral Daily  . atorvastatin  40 mg Oral q1800  . Chlorhexidine Gluconate Cloth  6 each Topical Q0600  . feeding supplement (NEPRO CARB STEADY)  237 mL Oral Q24H  . feeding supplement (PRO-STAT SUGAR FREE 64)  30 mL Oral BID  . insulin aspart  0-5 Units Subcutaneous QHS  . insulin aspart  0-9 Units Subcutaneous TID WC  . insulin glargine  22 Units Subcutaneous QHS  . nystatin  5 mL Oral QID  . polyethylene glycol  17 g Oral Daily  . senna-docusate  1 tablet Oral BID  . sevelamer carbonate  800 mg Oral TID WC  . warfarin  7.5 mg Oral ONCE-1800  . Warfarin - Pharmacist Dosing Inpatient   Does not apply q1800       LABS:   CBC Latest Ref Rng & Units 04/27/2018 04/26/2018 04/25/2018  WBC 4.0 - 10.5 K/uL 6.0 7.3 7.1  Hemoglobin 13.0 - 17.0 g/dL 9.3(L) 9.8(L) 10.7(L)  Hematocrit 39.0 - 52.0 % 30.0(L) 31.9(L) 34.8(L)  Platelets 150 - 400 K/uL 260 272 288    CMP Latest Ref Rng & Units 04/26/2018 04/25/2018 04/24/2018  Glucose 70 - 99 mg/dL 117(H) 186(H) 185(H)  BUN 6 - 20 mg/dL 26(H) 64(H) 53(H)  Creatinine 0.61 - 1.24 mg/dL 7.58(H) 13.49(H) 11.34(H)  Sodium 135 - 145  mmol/L 135 129(L) 131(L)  Potassium 3.5 - 5.1 mmol/L 3.9 4.6 4.0  Chloride 98 - 111 mmol/L 100 92(L) 97(L)  CO2 22 - 32 mmol/L 25 17(L) 22  Calcium 8.9 - 10.3 mg/dL 8.6(L) 9.1 9.0  Total Protein 6.5 - 8.1 g/dL - - -  Total Bilirubin 0.3 - 1.2 mg/dL - - -  Alkaline Phos 38 - 126 U/L - - -  AST 15 - 41 U/L - - -  ALT 0 - 44 U/L - - -    Lab Results  Component Value Date   CALCIUM 8.6 (L) 04/26/2018   CAION 1.19 04/15/2018   PHOS 4.9 (H) 04/26/2018       Component Value Date/Time   COLORURINE YELLOW 03/28/2018 1040   APPEARANCEUR CLEAR 03/28/2018 1040   LABSPEC 1.015 03/28/2018 1040   PHURINE 7.0 03/28/2018 1040   GLUCOSEU 50 (A) 03/28/2018 1040   HGBUR NEGATIVE 03/28/2018 1040   BILIRUBINUR NEGATIVE 03/28/2018 1040   KETONESUR NEGATIVE 03/28/2018 1040   PROTEINUR 100 (A) 03/28/2018 1040   UROBILINOGEN 0.2 12/18/2006 0428   NITRITE NEGATIVE 03/28/2018 1040   LEUKOCYTESUR NEGATIVE  03/28/2018 1040      Component Value Date/Time   PHART 7.330 (L) 04/15/2018 0435   PCO2ART 47.7 04/15/2018 0435   PO2ART 101.0 04/15/2018 0435   HCO3 25.1 04/15/2018 0435   TCO2 27 04/15/2018 0435   ACIDBASEDEF 1.0 04/15/2018 0435   O2SAT 97.0 04/15/2018 0435       Component Value Date/Time   IRON 56 12/18/2006 0736   TIBC 314 12/18/2006 0736   FERRITIN 65 12/18/2006 0736   IRONPCTSAT 18 (L) 12/18/2006 0736       ASSESSMENT/PLAN:     1.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 S/p CRRT- came off 2/4.Minimal urine output noted.  S/p permacath and fistula 2/13.  Given outpatient TTS schedule confirmed (start on Tues 2/18 though he will actually start after CIR discharge.)    2. Acute hypoxic respiratory failure:With evidence of ARDS and preceding viral pneumonia/superimposed HCAP versus aspiration pneumonia-s/p zosyn course.Stable on ra  now  3.Anemia:Hemoglobin 9.3 now. no recent iron indices - check.  If low supplement, if ok start ESA>   4. History of atrial fibrillation/atrial flutter status post DCCV and ablation.He is currently rate controlled  5. Acute exacerbation of CHF with cardiogenic shock:per primary, appears resolved.  Maintain euvolemia with UF.   6.  Hyperphosphatemia.  We will continue phosphate binder. controlled  7.  DM.  Per primary  8.  Hyponatremia:  Should be on 1267mL fluid restriction.  Resolved.   9. Dispo: [facility]TS 2/18 start 12:25 (on hold for CIR).   [place] for discharge from nephrology perspective.    Jannifer Hick MD [facility] Pager [phone]

## 2018-04-27 NOTE — Progress Notes (Signed)
[facility] PHYSICAL MEDICINE & REHABILITATION PROGRESS NOTE   Subjective/Complaints: Nurse reports patient complained of being hungry last night, but that she noted him to be distended. Further report of nausea and patient himself states he has RLQ discomfort which radiates to his back. Vomited x 1. Denies nausea at present, sitting eob. Moved bowels yesterday?  ROS: Patient denies fever, rash, sore throat, blurred vision,   diarrhea, cough, shortness of breath or chest pain, joint or back pain, headache, or mood change.    Objective:   Dg Abd 1 View  Result Date: 04/27/2018 CLINICAL DATA:  Ileus.  Upper abdominal pain. EXAM: ABDOMEN - 1 VIEW COMPARISON:  04/09/2018; CT abdomen pelvis-04/11/2018 FINDINGS: Paucity of bowel gas without evidence of enteric obstruction. Moderate colonic stool burden. No supine evidence of pneumoperitoneum. No pneumatosis or portal venous gas. Limited visualization of the lower thorax demonstrates improved aeration of lung bases. An AICD/pacer lead overlies the expected location of the right ventricle. No acute osseous abnormalities. IMPRESSION: Paucity of bowel gas without evidence of enteric obstruction. Electronically Signed   By: Sandi Mariscal M.D.   On: 04/27/2018 09:07   Dg Chest Port 1v Same Day  Result Date: 04/25/2018 CLINICAL DATA:  Status post catheter placement. EXAM: PORTABLE CHEST 1 VIEW COMPARISON:  Radiograph of April 18, 2018. FINDINGS: Stable cardiomegaly. Left-sided pacemaker is unchanged in position. Interval placement of right internal jugular dialysis catheter with distal tip in expected position of right atrium. No pneumothorax is noted. Lungs are unremarkable. Bony thorax is unremarkable. IMPRESSION: Interval placement of right internal jugular dialysis catheter with distal tip in expected position of right atrium. No pneumothorax is noted. Electronically Signed   By: Marijo Conception, M.D.   On: 04/25/2018 15:25   Dg Fluoro Guide Cv Line-no  Report  Result Date: 04/25/2018 Fluoroscopy was utilized by the requesting physician.  No radiographic interpretation.   Recent Labs    04/26/18 0305 04/27/18 0715  WBC 7.3 6.0  HGB 9.8* 9.3*  HCT 31.9* 30.0*  PLT 272 260   Recent Labs    04/25/18 0739 04/26/18 0305  NA 129* 135  K 4.6 3.9  CL 92* 100  CO2 17* 25  GLUCOSE 186* 117*  BUN 64* 26*  CREATININE 13.49* 7.58*  CALCIUM 9.1 8.6*    Intake/Output Summary (Last 24 hours) at 04/27/2018 0959 Last data filed at 04/27/2018 0850 Gross per 24 hour  Intake 650 ml  Output 1 ml  Net 649 ml     Physical Exam: Vital Signs Blood pressure 127/78, pulse 83, temperature 98.9 F (37.2 C), temperature source Oral, resp. rate (!) 22, height 5\' 10"  (1.778 m), weight 101 kg, SpO2 96 %. Constitutional: No distress . Vital signs reviewed. HEENT: EOMI, oral membranes moist Neck: supple Cardiovascular: RRR without murmur. No JVD    Respiratory: CTA Bilaterally without wheezes or rales. Normal effort , oxygen [place]  GI: BS scarce, mild pain with palpation RLQ, abdomen sl distended. Musculoskeletal:        General: No tenderness or edema.  Neurological: He is alert and oriented to person, place, and time.  Slow to process, speech slurred Able to follow basic commands.  Motor: Grossly 4+/5 throughout, good sitting balance  Skin: Skin is warm and dry.  Psychiatric:flat     Assessment/Plan: 1. Functional deficits secondary to debility/encephalopathy which require 3+ hours per day of interdisciplinary therapy in a comprehensive inpatient rehab setting.  Physiatrist is providing close team supervision and 24 hour management of  active medical problems listed below.  Physiatrist and rehab team continue to assess barriers to discharge/monitor patient progress toward functional and medical goals  Care Tool:  Bathing    Body parts bathed by patient: Right arm, Left arm, Chest, Abdomen, Right upper leg, Left upper leg, Face   Body  parts bathed by helper: (did not complete LB due to arrival of x-ray)     Bathing assist Assist Level: Contact Guard/Touching assist     Upper Body Dressing/Undressing Upper body dressing   What is the patient wearing?: Hospital gown only    Upper body assist Assist Level: Contact Guard/Touching assist    Lower Body Dressing/Undressing Lower body dressing      What is the patient wearing?: Hospital gown only     Lower body assist Assist for lower body dressing: Contact Guard/Touching assist     Toileting Toileting    Toileting assist Assist for toileting: Moderate Assistance - Patient 50 - 74%     Transfers Chair/bed transfer  Transfers assist     Chair/bed transfer assist level: Contact Guard/Touching assist     Locomotion Ambulation   Ambulation assist              Walk 10 feet activity   Assist           Walk 50 feet activity   Assist           Walk 150 feet activity   Assist           Walk 10 feet on uneven surface  activity   Assist           Wheelchair     Assist               Wheelchair 50 feet with 2 turns activity    Assist            Wheelchair 150 feet activity     Assist          Medical Problem List and Plan: 1.  Deficits with mobility, endurance, and self-care secondary to debility/encephalopathy.             -PT, OT, SLP evals today 2.  A fib/DVT Prophylaxis/Anticoagulation: Pharmaceutical: Coumadin with Heparin bridge 3. Pain Management: Hydrocodone prn for LUE pain.  4. Mood: LCSW to follow for evaluation and support.  5. Neuropsych: This patient is not fully capable of making decisions on his own behalf. 6. Skin/Wound Care: Routine pressure relief measures.  7. Fluids/Electrolytes/Nutrition: Strict I/O. Check weights daily. Will consult dietician to educate patient on renal diet.  8. AKI on CKD: Likely ESRD- continue HD TTS. On phosphate binders and 1200 cc FR. Schedule  HD at the end of the day to help with tolerance of therapy. .  9. H/o Afib/A flutter: Continues to have bouts of PAF. HR controlled on amiodarone. No BB due to soft BP.  10. Anemia of chronic disease: Monitored serially with HD,    -hgb 9.2 today 11. T2DM: Monitor BS ac/hs. Continue Lantus 22 units at bedtime. Added scheduled meal coverage for tighter control.  -reasonable control 2/15  12.  Decompensated heart failure: Managed with HD--hyponatremia resolved.  Decreased FR to 1200cc. Monitor daily weights. Wean oxygen as able.  13. Low protein malnutrition: ON nephro. Will add prostat also. 34. Metavirus PNA/HCAP: Has completed antibiotic regimen. Encourage pulmonary hygiene. Wean supplemental oxygen as tolerated--has been using prn in the month PTA.    15. RLQ pain, N/V: KUB personally reviewed  without ileus or obvious obstruction.   -abdomen does not appear acute on exam, pt is a poor historian   -wbc this morning unremarkable other than ongoing anemia  -had BM yesterday  -will treat conservatively at present and monitor, treat nausea/pain as needed  -might be musculoskeletal    LOS: 1 days A FACE TO [place] 04/27/2018, 9:59 AM

## 2018-04-27 NOTE — Evaluation (Signed)
Physical Therapy Assessment and Plan  Patient Details  Name: Travis Bryan MRN: [id number] Date of Birth: 1971-01-29  PT Diagnosis: Difficulty walking and Muscle weakness Rehab Potential: Good ELOS: 7-10 days    Today's Date: 04/27/2018 PT Individual Time: 1100-1200 PT Individual Time Calculation (min): 60 min    Problem List:  Patient Active Problem List   Diagnosis Date Noted  . Debility 04/26/2018  . Hypoxia   . Post-operative state   . Anemia of chronic disease   . PAF (paroxysmal atrial fibrillation) ([place])   . Pressure injury of skin 04/17/2018  . Bilateral pneumonia 03/29/2018  . Acute respiratory failure with hypoxia ([place]) 03/28/2018  . DKA (diabetic ketoacidosis) ([place]) 02/25/2018  . Acute on chronic systolic heart failure, NYHA class 4 ([place]) 02/25/2018  . Hyperlipidemia 02/25/2018  . Atrial fibrillation ([place]) 02/25/2018  . CAD (coronary artery disease) 02/25/2018  . Elevated troponin 02/25/2018  . Sepsis due to pneumonia ([place]) 02/25/2018  . AKI (acute kidney injury) ([place]) 02/25/2018  . Type 2 diabetes mellitus with complication, with long-term current use of insulin ([place]) 12/26/2017  . At risk for sexually transmitted disease due to unprotected sex 08/23/2017  . Healthcare maintenance 06/24/2016  . Persistent atrial fibrillation   . CARDIOMYOPATHY, SECONDARY 07/03/2008  . HFrEF (heart failure with reduced ejection fraction) ([place]) 07/03/2008  . Automatic implantable cardioverter-defibrillator in situ 07/03/2008  . Type 2 diabetes mellitus ([place]) 12/26/2005  . Hyperlipidemia associated with type 2 diabetes mellitus ([place]) 12/26/2005  . Hypertension associated with diabetes ([place]) 12/26/2005    Past Medical History:  Past Medical History:  Diagnosis Date  . AICD (automatic cardioverter/defibrillator) present   . Atrial flutter ([place])    a. s/p ablation 03/2016  . Chronic systolic CHF (congestive heart failure) ([place])   . HCAP (healthcare-associated pneumonia)  03/28/2018  . History of gout   . Hyperlipidemia   . Hypertension   . Myocardial infarction [facility])    "I think I had one a long long time ago" (03/28/2016)  . NICM (nonischemic cardiomyopathy) ([place])    a. LHC 6/06: pLAD 20, pLCx 20-30; b. Echo 5/15:  EF 15%, diffuse HK, restrictive physiology, trivial AI, trivial MR, mild LAE, moderate RVE, moderately reduced RVSF, moderate RAE, mild to moderate TR, PASP 43 mmHg  . Obesity   . Persistent atrial fibrillation   . Type II diabetes mellitus ([place])    Past Surgical History:  Past Surgical History:  Procedure Laterality Date  . AV FISTULA PLACEMENT Left 04/25/2018   Procedure: ARTERIOVENOUS (AV) VS GRAFT ARM;  Surgeon: Serafina Mitchell, MD;  Location: [facility];  Service: Vascular;  Laterality: Left;  . CARDIOVERSION N/A 04/07/2016   Procedure: CARDIOVERSION;  Surgeon: Thayer Headings, MD;  Location: [facility] ENDOSCOPY;  Service: Cardiovascular;  Laterality: N/A;  . CARDIOVERSION N/A 04/11/2016   Procedure: CARDIOVERSION;  Surgeon: Larey Dresser, MD;  Location: [facility];  Service: Cardiovascular;  Laterality: N/A;  . ELECTROPHYSIOLOGIC STUDY N/A 03/31/2016   Procedure: A-Flutter Ablation;  Surgeon: Evans Lance, MD;  Location: [facility] CV LAB;  Service: Cardiovascular;  Laterality: N/A;  . EXCHANGE OF A DIALYSIS CATHETER Left 04/25/2018   Procedure: Attempted Exchange Of A Dialysis Catheter on the Left side;  Surgeon: Serafina Mitchell, MD;  Location[facility];  Service: Vascular;  Laterality: Left;  . IMPLANTABLE CARDIOVERTER DEFIBRILLATOR (ICD) GENERATOR CHANGE N/A 08/27/2013   Procedure: ICD GENERATOR CHANGE;  Surgeon: Evans Lance, MD;  Location: [facility] CATH LAB;  Service: Cardiovascular;  Laterality: N/A;  . INSERTION OF DIALYSIS CATHETER Right 04/25/2018   Procedure: INSERTION OF TUNNELED DIALYSIS CATHETER;  Surgeon: Serafina Mitchell, MD;  Location: [facility] OR;  Service: Vascular;  Laterality: Right;  . TEE WITHOUT CARDIOVERSION N/A 03/31/2016   Procedure:  TRANSESOPHAGEAL ECHOCARDIOGRAM (TEE);  Surgeon: Larey Dresser, MD;  Location: [facility];  Service: Cardiovascular;  Laterality: N/A;    Assessment & Plan Clinical Impression: Patient is a 48 year old right-handed male with history of NICM/AICD, chronic systolic congestive heart failure, atrial flutter-on Coumadin, CKD III-IV, HTN, recent hospitalization for pneumonia, who was admitted on 03/28/2018 with low-grade fevers, nausea/vomiting, progressive dyspnea and lethargy.  He was started on BiPAP as well as broad spectrum abx and diuretics.  Work-up revealed acute on chronic renal failure as well as acute hypoxemic respiratory failure due to Metapneumovirus pneumonia in setting of  acute on chronic CHF.  He continued to have worsening of renal status with poor urine output and CRRT initiated.  He developed PEA arrest on 01//20 requiring CPR x14 minutes with ACLS protocol.  He was intubated and has required pressors due to cardiogenic shock.   Hospital course significant for ARDS, HCAP versus aspiration pneumonia, coagulopathy requiring reversal of Coumadin with FFP, abdominal distention due to ileus and acute on chronic anemia.  He tolerated extubation on 2/4 and CRRT discontinued but patient continued to have minimal without urine output without  improvement in renal status.  Hemodialysis initiated and nephrology felt that patient had progressed to ESRD--has been clipped to [facility] PTS-12:25 chair.  Left AVF placed on 02/13  by Dr. Trula Slade. He continues to have runs of PAF and on amiodarone. Heparin resumed to bridge coumadin. He continues on oxygen due to issue with hypoxia, decreased endurance with debility. Patient transferred to CIR on 04/26/2018 .   Patient currently requires min with mobility secondary to muscle weakness, decreased cardiorespiratoy endurance and decreased oxygen support, decreased visual motor skills, decreased attention, decreased awareness, decreased problem solving and  delayed processing and decreased standing balance and decreased balance strategies.  Prior to hospitalization, patient was independent  with mobility and lived with Family(Aunt and Uncle) in a House home.  Home access is 3Stairs to enter.  Patient will benefit from skilled PT intervention to maximize safe functional mobility, minimize fall risk and decrease caregiver burden for planned discharge home with intermittent assist.  Anticipate patient will benefit from follow up [facility] at discharge.  PT - End of Session Activity Tolerance: Tolerates 10 - 20 min activity with multiple rests Endurance Deficit: Yes PT Assessment Rehab Potential (ACUTE/IP ONLY): Good PT Barriers to Discharge: Inaccessible home environment;Medical stability;Wound Care;Insurance for SNF coverage;Hemodialysis;Medication compliance;Pending surgery;Behavior PT Patient demonstrates impairments in the following area(s): Balance;Behavior;Edema;Endurance;Nutrition;Perception;Pain;Safety;Skin Integrity PT Transfers Functional Problem(s): Bed Mobility;Bed to Chair;Furniture;Floor;Car PT Locomotion Functional Problem(s): Ambulation;Wheelchair Mobility;Stairs PT Plan PT Intensity: Minimum of 1-2 x/day ,45 to 90 minutes PT Frequency: 5 out of 7 days PT Duration Estimated Length of Stay: 7-10 days  PT Treatment/Interventions: Ambulation/gait training;Cognitive remediation/compensation;Discharge planning;DME/adaptive equipment instruction;Functional mobility training;Pain management;Psychosocial support;Splinting/orthotics;Therapeutic Activities;UE/LE Strength taining/ROM;Visual/perceptual remediation/compensation;UE/LE Coordination activities;Wheelchair propulsion/positioning;Therapeutic Exercise;Neuromuscular re-education;Patient/family education;Skin care/wound management;Stair training;Disease management/prevention;Community reintegration;Balance/vestibular training PT Transfers Anticipated Outcome(s): Mod I with LRAD  PT Locomotion  Anticipated Outcome(s): Mod I ambulatory with LRAD  PT Recommendation Recommendations for Other Services: Therapeutic Recreation consult Therapeutic Recreation Interventions: Outing/community reintergration;Stress management Follow Up Recommendations: Home health PT Patient destination: Home  Skilled Therapeutic Intervention Pt received supine in bed and agreeable to PT. Supine>sit transfer with supevision assist and  min cues for safety. PT instructed patient in PT Evaluation and initiated treatment intervention; see below for results. PT educated patient in [place], rehab potential, rehab goals, and discharge recommendations. PT instructed pt in gait training with min assist as listed over level and unlevel surface, mild Lateral trunk instability to the L. Stair management with min assist x 12, car transfer with min assist and min cues for safety with IV management. SpO2> 96% throughout treatment. Only mild SOB reported by Pt. Patient returned to room and left sitting in [facility] with call bell in reach and all needs met.       PT Evaluation Precautions/Restrictions Precautions Precautions: Fall Precaution Comments: watch O2 Restrictions Weight Bearing Restrictions: No General   Vital Signs Pain Pain Assessment Pain Scale: 0-10 Pain Score: 8  Pain Type: Acute pain Pain Location: Flank Pain Orientation: Right Pain Radiating Towards: back Pain Descriptors / Indicators: Aching;Discomfort Pain Frequency: Intermittent Pain Onset: Gradual Patients Stated Pain Goal: 3 Pain Intervention(s): Rest;Relaxation Multiple Pain Sites: Yes Home Living/Prior Functioning Home Living Available Help at Discharge: Family;Available 24 hours/day Type of Home: House Home Access: Stairs to enter [employer] of Steps: 3 Entrance Stairs-Rails: Can reach both Home Layout: One level Bathroom Shower/Tub: Tub/shower unit;Curtain(has grab bars) Bathroom Toilet: Standard Bathroom Accessibility: Yes   Lives With: Printmaker) Prior Function Level of Independence: Independent with basic ADLs;Independent with homemaking with ambulation;Independent with gait  Able to Take Stairs?: Yes Driving: Yes Vocation: Part time employment(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  (Per Acute - pt with undershooting during reach and would often close Rt eye, pt with poor tracking and jumpy movements with eyes.  Pt also not maintaining gaze to Lt visual field and present with visual inattention)  Cognition Arousal/Alertness: Awake/alert Orientation Level: Oriented X4 Attention: Sustained;Selective Sustained Attention: Appears intact Selective Attention: Appears intact Awareness: Impaired Safety/Judgment: Appears intact Sensation Sensation Light Touch: Appears Intact Proprioception: Appears Intact Coordination Gross Motor Movements are Fluid and Coordinated: Yes Finger Nose Finger Test: should shrug on the L. normal speed and accuracy.  Heel Shin Test: WFL equal R and L  Motor  Motor Motor: Other (comment) Motor - Skilled Clinical Observations: gernalized weakness  Mobility Transfers Transfers: Sit to Stand Sit to Stand: Contact Guard/Touching assist Locomotion  Gait Ambulation: Yes Gait Assistance: Minimal Assistance - Patient > 75% Gait Distance (Feet): 150 Feet Assistive device: None Gait Assistance Details: Tactile cues for weight shifting Gait Gait: Yes Gait Pattern: Impaired Gait Pattern: Lateral trunk lean to left Stairs / Additional Locomotion Stairs: Yes Stairs Assistance: Minimal Assistance - Patient > 75% Stair Management Technique: Two rails Number of Stairs: 12 Height of Stairs: 6 Wheelchair Mobility Wheelchair Mobility: Yes Wheelchair Assistance: Chartered loss adjuster: Both upper  extremities Wheelchair Parts Management: Supervision/cueing;Needs assistance Distance: 12f  Trunk/Postural Assessment  Cervical Assessment Cervical Assessment: Within FWater engineerThoracic Assessment: Within Functional Limits Lumbar Assessment Lumbar Assessment: Within Functional Limits Postural Control Postural Control: Deficits on evaluation(mild lateral instability to the L in standing. )  Balance Balance Balance Assessed: Yes Dynamic Sitting Balance Dynamic Sitting - Level of Assistance: 5: Stand by assistance Static Standing Balance Static Standing - Level of Assistance: 5: Stand by assistance Dynamic Standing Balance Dynamic Standing - Level of Assistance: 4: Min assist Extremity Assessment  RUE Assessment RUE Assessment: Within Functional Limits General Strength  Comments: grossly 4/5 LUE Assessment LUE Assessment: Within Functional Limits General Strength Comments: grossly 4/5 RLE Assessment RLE Assessment: Within Functional Limits General Strength Comments: 5/5 proximal to distal  LLE Assessment LLE Assessment: Exceptions to [facility] General Strength Comments: 4+/5 proximal to distal     Refer to Care Plan for Long Term Goals  Recommendations for other services: Therapeutic Recreation  Stress management and Outing/community reintegration  Discharge Criteria: Patient will be discharged from PT if patient refuses treatment 3 consecutive times without medical reason, if treatment goals not met, if there is a change in medical status, if patient makes no progress towards goals or if patient is discharged from hospital.  The above assessment, treatment plan, treatment alternatives and goals were discussed and mutually agreed upon: by patient  Lorie Phenix 04/27/2018, 10:35 AM

## 2018-04-27 NOTE — Evaluation (Signed)
Occupational Therapy Assessment and Plan  Patient Details  Name: Travis Bryan MRN: [id number] Date of Birth: 08/10/1970  OT Diagnosis: acute pain and muscle weakness (generalized) Rehab Potential: Rehab Potential (ACUTE ONLY): Good ELOS: 7-10 days   Today's Date: 04/27/2018 OT Individual Time: 0730-0828 OT Individual Time Calculation (min): 58 min  and Today's Date: 04/27/2018 OT Missed Time: 17 Minutes Missed Time Reason: X-Ray    Problem List:  Patient Active Problem List   Diagnosis Date Noted  . Debility 04/26/2018  . Hypoxia   . Post-operative state   . Anemia of chronic disease   . PAF (paroxysmal atrial fibrillation) (HCC)   . Pressure injury of skin 04/17/2018  . Bilateral pneumonia 03/29/2018  . Acute respiratory failure with hypoxia (HCC) 03/28/2018  . DKA (diabetic ketoacidosis) (HCC) 02/25/2018  . Acute on chronic systolic heart failure, NYHA class 4 (HCC) 02/25/2018  . Hyperlipidemia 02/25/2018  . Atrial fibrillation (HCC) 02/25/2018  . CAD (coronary artery disease) 02/25/2018  . Elevated troponin 02/25/2018  . Sepsis due to pneumonia (HCC) 02/25/2018  . AKI (acute kidney injury) (HCC) 02/25/2018  . Type 2 diabetes mellitus with complication, with long-term current use of insulin (HCC) 12/26/2017  . At risk for sexually transmitted disease due to unprotected sex 08/23/2017  . Healthcare maintenance 06/24/2016  . Persistent atrial fibrillation   . CARDIOMYOPATHY, SECONDARY 07/03/2008  . HFrEF (heart failure with reduced ejection fraction) (HCC) 07/03/2008  . Automatic implantable cardioverter-defibrillator in situ 07/03/2008  . Type 2 diabetes mellitus (HCC) 12/26/2005  . Hyperlipidemia associated with type 2 diabetes mellitus (HCC) 12/26/2005  . Hypertension associated with diabetes (HCC) 12/26/2005    Past Medical History:  Past Medical History:  Diagnosis Date  . AICD (automatic cardioverter/defibrillator) present   . Atrial flutter (HCC)    a. s/p  ablation 03/2016  . Chronic systolic CHF (congestive heart failure) (HCC)   . HCAP (healthcare-associated pneumonia) 03/28/2018  . History of gout   . Hyperlipidemia   . Hypertension   . Myocardial infarction (HCC)    "I think I had one a long long time ago" (03/28/2016)  . NICM (nonischemic cardiomyopathy) (HCC)    a. LHC 6/06: pLAD 20, pLCx 20-30; b. Echo 5/15:  EF 15%, diffuse HK, restrictive physiology, trivial AI, trivial MR, mild LAE, moderate RVE, moderately reduced RVSF, moderate RAE, mild to moderate TR, PASP 43 mmHg  . Obesity   . Persistent atrial fibrillation   . Type II diabetes mellitus (HCC)    Past Surgical History:  Past Surgical History:  Procedure Laterality Date  . AV FISTULA PLACEMENT Left 04/25/2018   Procedure: ARTERIOVENOUS (AV) VS GRAFT ARM;  Surgeon: Brabham, Vance W, MD;  Location: [facility] OR;  Service: Vascular;  Laterality: Left;  . CARDIOVERSION N/A 04/07/2016   Procedure: CARDIOVERSION;  Surgeon: Philip J Nahser, MD;  Location: [facility] ENDOSCOPY;  Service: Cardiovascular;  Laterality: N/A;  . CARDIOVERSION N/A 04/11/2016   Procedure: CARDIOVERSION;  Surgeon: Dalton S McLean, MD;  Location: [facility] ENDOSCOPY;  Service: Cardiovascular;  Laterality: N/A;  . ELECTROPHYSIOLOGIC STUDY N/A 03/31/2016   Procedure: A-Flutter Ablation;  Surgeon: Gregg W Taylor, MD;  Location: [facility] INVASIVE CV LAB;  Service: Cardiovascular;  Laterality: N/A;  . EXCHANGE OF A DIALYSIS CATHETER Left 04/25/2018   Procedure: Attempted Exchange Of A Dialysis Catheter on the Left side;  Surgeon: Brabham, Vance W, MD;  Location: [facility] OR;  Service: Vascular;  Laterality: Left;  . IMPLANTABLE CARDIOVERTER DEFIBRILLATOR (ICD) GENERATOR CHANGE N/A 08/27/2013   Procedure:   ICD GENERATOR CHANGE;  Surgeon: Evans Lance, MD;  Location: [facility] CATH LAB;  Service: Cardiovascular;  Laterality: N/A;  . INSERTION OF DIALYSIS CATHETER Right 04/25/2018   Procedure: INSERTION OF TUNNELED DIALYSIS CATHETER;  Surgeon: Serafina Mitchell,  MD;  Location: [facility] OR;  Service: Vascular;  Laterality: Right;  . TEE WITHOUT CARDIOVERSION N/A 03/31/2016   Procedure: TRANSESOPHAGEAL ECHOCARDIOGRAM (TEE);  Surgeon: Larey Dresser, MD;  Location: [facility];  Service: Cardiovascular;  Laterality: N/A;    Assessment & Plan Clinical Impression: Patient is a 48 y.o. right-handed male with history of NICM/AICD, chronic systolic congestive heart failure, atrial flutter-on Coumadin, CKD III-IV, HTN, recent hospitalization for pneumonia, who was admitted on 03/28/2018 with low-grade fevers, nausea/vomiting, progressive dyspnea and lethargy.  He was started on BiPAP as well as broad spectrum abx and diuretics.  Work-up revealed acute on chronic renal failure as well as acute hypoxemic respiratory failure due to Metapneumovirus pneumonia in setting of  acute on chronic CHF.  He continued to have worsening of renal status with poor urine output and CRRT initiated.  He developed PEA arrest on 01//20 requiring CPR x14 minutes with ACLS protocol.  He was intubated and has required pressors due to cardiogenic shock.   Hospital course significant for ARDS, HCAP versus aspiration pneumonia, coagulopathy requiring reversal of Coumadin with FFP, abdominal distention due to ileus and acute on chronic anemia.  He tolerated extubation on 2/4 and CRRT discontinued but patient continued to have minimal without urine output without  improvement in renal status.  Hemodialysis initiated and nephrology felt that patient had progressed to ESRD--has been clipped to [facility] PTS-12:25 chair.  Left AVF placed on 02/13  by Dr. Trula Slade. He continues to have runs of PAF and on amiodarone. Heparin resumed to bridge coumadin. He continues on oxygen due to issue with hypoxia, decreased endurance with debility. CIR recommended due to functional decline and patient felt to be medically optimized.     Patient transferred to CIR on 04/26/2018 .    Patient currently requires min with  basic self-care skills secondary to muscle weakness, decreased cardiorespiratoy endurance and decreased oxygen support, decreased awareness, decreased safety awareness and delayed processing and decreased standing balance.  Prior to hospitalization, patient could complete ADLs with independent .  Patient will benefit from skilled intervention to increase independence with basic self-care skills prior to discharge home with care partner.  Anticipate patient will require intermittent supervision and follow up outpatient.  OT - End of Session Activity Tolerance: Tolerates 30+ min activity with multiple rests Endurance Deficit: Yes Endurance Deficit Description: required frequent rest breaks OT Assessment Rehab Potential (ACUTE ONLY): Good OT Barriers to Discharge: Hemodialysis OT Patient demonstrates impairments in the following area(s): Balance;Cognition;Endurance;Motor;Pain;Safety;Vision OT Basic ADL's Functional Problem(s): Grooming;Bathing;Dressing;Toileting OT Advanced ADL's Functional Problem(s): Laundry OT Transfers Functional Problem(s): Toilet;Tub/Shower OT Additional Impairment(s): None OT Plan OT Intensity: Minimum of 1-2 x/day, 45 to 90 minutes OT Frequency: 5 out of 7 days OT Duration/Estimated Length of Stay: 7-10 days OT Treatment/Interventions: Balance/vestibular training;Cognitive remediation/compensation;Community reintegration;Discharge planning;Disease mangement/prevention;DME/adaptive equipment instruction;Functional mobility training;Pain management;Patient/family education;Psychosocial support;Self Care/advanced ADL retraining;Skin care/wound managment;Therapeutic Activities;Therapeutic Exercise;UE/LE Strength taining/ROM;UE/LE Coordination activities;Visual/perceptual remediation/compensation OT Basic Self-Care Anticipated Outcome(s): Mod I OT Toileting Anticipated Outcome(s): Mod I OT Bathroom Transfers Anticipated Outcome(s): Mod I OT Recommendation Patient  destination: Home Follow Up Recommendations: Outpatient OT Equipment Recommended: Tub/shower seat   Skilled Therapeutic Intervention OT eval completed with discussion of rehab process, OT purpose, POC, ELOS, and goals.  Limited ADL  assessment completed due to arrival of x-ray for abdominal scan.  Pt received upright in recliner agreeable to therapy session, however somewhat guarded during mobility due to pain in abdomen.  Ambulated to sink to engage in bathing with Min assist and wide BOS.  Pt required min-mod cues for sequencing and initiation during bathing at sink.  CGA for standing balance during bathing.  Did not complete LB bathing/dressing due to arrival of x-ray for abdominal scan.  Pt returned to bed with Min assist and transferred to supine in preparation for scan.  Pt left semi-reclined with x-ray technician setting up.    OT Evaluation Precautions/Restrictions  Precautions Precautions: Fall Precaution Comments: watch O2 General   Vital Signs Therapy Vitals Temp: 98.9 F (37.2 C) Temp Source: Oral Pulse Rate: 83 Resp: (!) 22 BP: 127/78 Patient Position (if appropriate): Lying Oxygen Therapy SpO2: 96 % O2 Device: Nasal Cannula Pain Pain Assessment Pain Scale: 0-10 Pain Score: 7  Pain Type: Acute pain Pain Location: Abdomen Pain Orientation: Right Pain Radiating Towards: back Pain Descriptors / Indicators: Aching;Discomfort Pain Frequency: Intermittent Pain Onset: Gradual Patients Stated Pain Goal: 3 Pain Intervention(s): RN made aware(premedicated) Multiple Pain Sites: Yes Home Living/Prior Functioning Home Living Family/patient expects to be discharged to:: Private residence Living Arrangements: Other relatives(uncle and aunt) Available Help at Discharge: Family, Available 24 hours/day Type of Home: House Home Access: Stairs to enter Technical brewer of Steps: 3 Entrance Stairs-Rails: Can reach both Home Layout: One level Bathroom Shower/Tub:  Tub/shower unit, Curtain(has grab bars) Bathroom Toilet: Standard Bathroom Accessibility: Yes  Lives With: Printmaker) IADL History Homemaking Responsibilities: Yes Meal Prep Responsibility: No Laundry Responsibility: Primary Cleaning Responsibility: No Bill Paying/Finance Responsibility: Secondary Shopping Responsibility: No Current License: Yes Mode of Transportation: Car Occupation: Part time employment Type of Occupation: 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, working temp jobs and looking for work Prior Function Level of Independence: Independent with basic ADLs, Independent with homemaking with ambulation, Independent with gait  Able to Take Stairs?: Yes Driving: Yes Vocation: Part time employment(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, working temp jobs and looking for work) ADL ADL Eating: Independent Where Assessed-Eating: Chair Grooming: Contact guard(in standing) Where Assessed-Grooming: Standing at sink Upper Body Bathing: Minimal cueing, Contact guard Where Assessed-Upper Body Bathing: Standing at sink Upper Body Dressing: Minimal assistance(due to IV) Toilet Transfer: Minimal assistance Toilet Transfer Method: Ambulating Vision Baseline Vision/History: No visual deficits Patient Visual Report: No change from baseline Vision Assessment?: Vision impaired- to be further tested in functional context Additional Comments: Unable to fully assess due to x-ray arriving for abdominal scan.  (Per Acute - pt with undershooting during reach and would often close Rt eye, pt with poor tracking and jumpy movements with eyes.  Pt also not maintaining gaze to Lt visual field and present with visual inattention) Cognition Arousal/Alertness: Awake/alert Orientation Level: Person;Place;Situation Person: Oriented Place: Oriented Situation: (only oriented to pneumonia) Year: 2020 Month: February Day of Week: Correct Immediate Memory Recall: Sock;Blue;Bed Memory Recall: Sock;Bed;Blue Memory  Recall Sock: Without Cue Memory Recall Blue: Without Cue Memory Recall Bed: Without Cue Attention: Sustained;Selective Sustained Attention: Appears intact Selective Attention: Appears intact Awareness: Impaired Safety/Judgment: Appears intact Sensation Sensation Light Touch: Appears Intact Proprioception: Appears Intact Mobility  Transfers Sit to Stand: Contact Guard/Touching assist  Extremity/Trunk Assessment RUE Assessment RUE Assessment: Within Functional Limits General Strength Comments: grossly 4/5 LUE Assessment LUE Assessment: Within Functional Limits General Strength Comments: grossly 4/5     Refer to Care Plan for Long Term Goals  Recommendations for other services: None    Discharge Criteria: Patient will be discharged from OT if patient refuses treatment 3 consecutive times without medical reason, if treatment goals not met, if there is a change in medical status, if patient makes no progress towards goals or if patient is discharged from hospital.  The above assessment, treatment plan, treatment alternatives and goals were discussed and mutually agreed upon: by patient  ,  04/27/2018, 8:44 AM  

## 2018-04-27 NOTE — Progress Notes (Signed)
[facility] for Heparin and Coumadin Indication: atrial fibrillation  Patient Measurements: Height: 5\' 10"  (177.8 cm) Weight: 222 lb 10.6 oz (101 kg) IBW/kg (Calculated) : 73  Heparin dosing weight: 99 kg  Vital Signs: Temp: 98.9 F (37.2 C) (02/15 0519) Temp Source: Oral (02/15 0519) BP: 127/78 (02/15 0519) Pulse Rate: 83 (02/15 0519)  Labs: Recent Labs    04/25/18 0739 04/26/18 0305 04/26/18 1547 04/27/18 0715  HGB 10.7* 9.8*  --  9.3*  HCT 34.8* 31.9*  --  30.0*  PLT 288 272  --  260  LABPROT 16.1* 17.0*  --  16.4*  INR 1.30 1.40  --  1.34  HEPARINUNFRC 0.43  --  0.40 0.41  CREATININE 13.49* 7.58*  --   --     Assessment: 48 year old male presenting on 03/28/18 with NV and fever, h/o afib on Coumadin PTA.  Coumadin held for procedures, Vitamin K given 2/4.  Pharmacy was consulted for heparin while off Coumadin. Coumadin resumed on 2/14 s/p TDC placement on 04/25/18. Transferred to CIR on 04/26/18  Heparin is currently therapeutic on 1450 units/hr.  PTA Coumadin dose: 7.5 mg MWF, 5 mg all other days.  Goal of Therapy:  INR 2-3 Heparin level 0.3-0.7 units/ml Monitor platelets by anticoagulation protocol: Yes   Plan:  Continue heparin drip at 1450 units/hr Repeat Coumadin 7.5 mg today. Daily heparin level, PT/INR and CBC.  [employer], Pharm.D., BCPS Clinical Pharmacist  **Pharmacist phone directory can now be found on [email or web address] ([email or web address]).  Listed under [place].  04/27/2018 9:19 AM

## 2018-04-27 NOTE — Progress Notes (Signed)
Patient called nurse to the room complaining of increased right flank pain and vomiting. Nurse called on call provider, Dr Naaman Plummer. Abdominal X-ray from earlier today was normal. Dr Mamie Laurel put in orders for CT scan and changed PRN pain medications.  Nicholes Rough, LPN

## 2018-04-28 DIAGNOSIS — E1169 Type 2 diabetes mellitus with other specified complication: Secondary | ICD-10-CM

## 2018-04-28 DIAGNOSIS — E669 Obesity, unspecified: Secondary | ICD-10-CM

## 2018-04-28 LAB — GLUCOSE, CAPILLARY
Glucose-Capillary: 106 mg/dL — ABNORMAL HIGH (ref 70–99)
Glucose-Capillary: 100 mg/dL — ABNORMAL HIGH (ref 70–99)
Glucose-Capillary: 109 mg/dL — ABNORMAL HIGH (ref 70–99)
Glucose-Capillary: 115 mg/dL — ABNORMAL HIGH (ref 70–99)

## 2018-04-28 LAB — HEPARIN LEVEL (UNFRACTIONATED): Heparin Unfractionated: 0.61 IU/mL (ref 0.30–0.70)

## 2018-04-28 LAB — CBC
HEMATOCRIT: 29 % — AB (ref 39.0–52.0)
Hemoglobin: 8.5 g/dL — ABNORMAL LOW (ref 13.0–17.0)
MCH: 27.8 pg (ref 26.0–34.0)
MCHC: 29.3 g/dL — ABNORMAL LOW (ref 30.0–36.0)
MCV: 94.8 fL (ref 80.0–100.0)
Platelets: 251 10*3/uL (ref 150–400)
RBC: 3.06 MIL/uL — ABNORMAL LOW (ref 4.22–5.81)
RDW: 15.7 % — ABNORMAL HIGH (ref 11.5–15.5)
WBC: 4.2 10*3/uL (ref 4.0–10.5)
nRBC: 0 % (ref 0.0–0.2)

## 2018-04-28 LAB — PROTIME-INR
INR: 1.43
Prothrombin Time: 17.2 seconds — ABNORMAL HIGH (ref 11.4–15.2)

## 2018-04-28 LAB — OCCULT BLOOD X 1 CARD TO LAB, STOOL: FECAL OCCULT BLD: NEGATIVE

## 2018-04-28 MED ORDER — WARFARIN SODIUM 7.5 MG PO TABS
7.5000 mg | ORAL_TABLET | Freq: Once | ORAL | Status: AC
  Administered 2018-04-28: 7.5 mg via ORAL
  Filled 2018-04-28: qty 1

## 2018-04-28 MED ORDER — SODIUM CHLORIDE 0.9 % IV SOLN
125.0000 mg | INTRAVENOUS | Status: DC
  Administered 2018-04-30 – 2018-05-04 (×4): 125 mg via INTRAVENOUS
  Filled 2018-04-28 (×4): qty 10

## 2018-04-28 MED ORDER — DARBEPOETIN ALFA 40 MCG/0.4ML IJ SOSY
40.0000 ug | PREFILLED_SYRINGE | INTRAMUSCULAR | Status: DC
  Administered 2018-04-30: 40 ug via INTRAVENOUS
  Filled 2018-04-28: qty 0.4

## 2018-04-28 NOTE — Progress Notes (Signed)
[facility] PHYSICAL MEDICINE & REHABILITATION PROGRESS NOTE   Subjective/Complaints: Patient slept well per nurse.  Patient denies any abdominal pain this morning.  Does have appetite for breakfast.  ROS: Patient denies fever, rash, sore throat,   nausea, vomiting, diarrhea, cough, shortness of breath or chest pain, joint or back pain, headache, or mood change.   Objective:   Ct Abdomen Pelvis Wo Contrast  Result Date: 04/27/2018 CLINICAL DATA:  Abdominal pain. Assess for colitis. April 11, 2018 EXAM: CT ABDOMEN AND PELVIS WITHOUT CONTRAST TECHNIQUE: Multidetector CT imaging of the abdomen and pelvis was performed following the standard protocol without IV contrast. COMPARISON:  April 11, 2018, January 05, 2005 FINDINGS: Lower chest: The heart size is enlarged. Minimal pericardial effusion is identified. Mild patchy and linear opacities are identified in bilateral lung bases, favor atelectasis. The previously noted consolidation of posterior right lung base is improved. Hepatobiliary: Nodular contour of the liver is identified. No focal liver lesion is noted. Gallstones are noted in the gallbladder. The biliary tree is normal. Pancreas: Unremarkable. Spleen: Unremarkable. Adrenals/Urinary Tract: 1.8 cm lobulated nodule is identified in the left adrenal gland enlarged compared to prior CT of 2006. The right adrenal gland is normal. The kidneys are normal. There is no hydronephrosis bilaterally. The bladder is normal. Stomach/Bowel: Stomach is within normal limits. Appendix appears normal. No evidence of bowel wall thickening, distention, or inflammatory changes. Vascular/Lymphatic: Aortic atherosclerosis. No enlarged abdominal or pelvic lymph nodes. Reproductive: Prostate is unremarkable. Other: Mild prominent lymph nodes are identified in bilateral inguinal region unchanged compared prior exam. Musculoskeletal: No acute or significant osseous findings. IMPRESSION: There is no evidence of colitis. No  bowel obstruction is identified. Findings suggesting cirrhosis of liver unchanged.  Cholelithiasis. 1.8 cm lobulated nodule is identified in the left adrenal gland enlarged compared to prior CT of 2006. Consider further evaluation with MRI on outpatient basis. Electronically Signed   By: Abelardo Diesel M.D.   On: 04/27/2018 20:17   Dg Abd 1 View  Result Date: 04/27/2018 CLINICAL DATA:  Ileus.  Upper abdominal pain. EXAM: ABDOMEN - 1 VIEW COMPARISON:  04/09/2018; CT abdomen pelvis-04/11/2018 FINDINGS: Paucity of bowel gas without evidence of enteric obstruction. Moderate colonic stool burden. No supine evidence of pneumoperitoneum. No pneumatosis or portal venous gas. Limited visualization of the lower thorax demonstrates improved aeration of lung bases. An AICD/pacer lead overlies the expected location of the right ventricle. No acute osseous abnormalities. IMPRESSION: Paucity of bowel gas without evidence of enteric obstruction. Electronically Signed   By: Sandi Mariscal M.D.   On: 04/27/2018 09:07   Recent Labs    04/27/18 0715 04/28/18 0645  WBC 6.0 4.2  HGB 9.3* 8.5*  HCT 30.0* 29.0*  PLT 260 251   Recent Labs    04/26/18 0305 04/27/18 1316  NA 135 128*  K 3.9 4.0  CL 100 93*  CO2 25 22  GLUCOSE 117* 155*  BUN 26* 47*  CREATININE 7.58* 10.52*  CALCIUM 8.6* 8.7*    Intake/Output Summary (Last 24 hours) at 04/28/2018 0857 Last data filed at 04/28/2018 0800 Gross per 24 hour  Intake 798.09 ml  Output 1500 ml  Net -701.91 ml     Physical Exam: Vital Signs Blood pressure 104/73, pulse 79, temperature 98.5 F (36.9 C), resp. rate (!) 26, height 5\' 10"  (1.778 m), weight 101.6 kg, SpO2 98 %. Constitutional: No distress . Vital signs reviewed. HEENT: EOMI, oral membranes moist Neck: supple Cardiovascular: RRR without murmur. No JVD  Respiratory: CTA Bilaterally without wheezes or rales. Normal effort    GI: BS +, non-tender, slightly-distended  Musculoskeletal:        General:  No back or trunk tenderness with palpation or movement today.  Neurological: He is alert and oriented to person, place, and time.    Remains slow to process, speech slurred Able to follow basic commands.  Motor: Grossly 4 to 4+/5 throughout   Skin: Skin is warm and dry.  Psychiatric: Very flat     Assessment/Plan: 1. Functional deficits secondary to debility/encephalopathy which require 3+ hours per day of interdisciplinary therapy in a comprehensive inpatient rehab setting.  Physiatrist is providing close team supervision and 24 hour management of active medical problems listed below.  Physiatrist and rehab team continue to assess barriers to discharge/monitor patient progress toward functional and medical goals  Care Tool:  Bathing    Body parts bathed by patient: Right arm, Left arm, Chest, Abdomen, Right upper leg, Left upper leg, Face   Body parts bathed by helper: (did not complete LB due to arrival of x-ray)     Bathing assist Assist Level: Contact Guard/Touching assist     Upper Body Dressing/Undressing Upper body dressing   What is the patient wearing?: Hospital gown only    Upper body assist Assist Level: Contact Guard/Touching assist    Lower Body Dressing/Undressing Lower body dressing      What is the patient wearing?: Hospital gown only     Lower body assist Assist for lower body dressing: Contact Guard/Touching assist     Toileting Toileting Toileting Activity did not occur (Clothing management and hygiene only): N/A (no void or bm)  Toileting assist Assist for toileting: Moderate Assistance - Patient 50 - 74%     Transfers Chair/bed transfer  Transfers assist     Chair/bed transfer assist level: Minimal Assistance - Patient > 75%     Locomotion Ambulation   Ambulation assist      Assist level: Minimal Assistance - Patient > 75%   Max distance: 135ft    Walk 10 feet activity   Assist     Assist level: Minimal Assistance -  Patient > 75%     Walk 50 feet activity   Assist    Assist level: Minimal Assistance - Patient > 75%      Walk 150 feet activity   Assist    Assist level: Minimal Assistance - Patient > 75%      Walk 10 feet on uneven surface  activity   Assist     Assist level: Minimal Assistance - Patient > 75%     Wheelchair     Assist Will patient use wheelchair at discharge?: No Type of Wheelchair: Manual    Wheelchair assist level: Supervision/Verbal cueing Max wheelchair distance: 189ft    Wheelchair 50 feet with 2 turns activity    Assist        Assist Level: Supervision/Verbal cueing   Wheelchair 150 feet activity     Assist     Assist Level: Supervision/Verbal cueing    Medical Problem List and Plan: 1.  Deficits with mobility, endurance, and self-care secondary to debility/encephalopathy.             -Therapies ongoing as tolerated 2.  A fib/DVT Prophylaxis/Anticoagulation: Pharmaceutical: Coumadin with Heparin bridge 3. Pain Management: Hydrocodone prn for LUE pain.  4. Mood: LCSW to follow for evaluation and support.  5. Neuropsych: This patient is not fully capable of making decisions on  his own behalf. 6. Skin/Wound Care: Routine pressure relief measures.  7. Fluids/Electrolytes/Nutrition: Strict I/O. Check weights daily.  Appreciate dietitians assistance.  8. AKI on CKD: Likely ESRD- continue HD TTS. On phosphate binders and 1200 cc FR. Scheduled HD at the end of the day to help with tolerance of therapy. .  9. H/o Afib/A flutter: Continues to have bouts of PAF. HR controlled on amiodarone. No BB due to soft BP.  10. Anemia of chronic disease: iron panel on 2/15 consistent with this  -Monitored serially with HD,    -hgb 9.2 2/15, down to 8.5 on 2/16  -Supplementation per nephrology  -Recheck hemoglobin tomorrow 11. T2DM: Monitor BS ac/hs. Continue Lantus 22 units at bedtime. Added scheduled meal coverage for tighter  control.  -reasonable control 2/16 12.  Decompensated heart failure: Managed with HD--hyponatremia resolved.  Decreased FR to 1200cc. Monitor daily weights. Wean oxygen as able.  13. Low protein malnutrition: ON nephro. Will add prostat also. 41. Metavirus PNA/HCAP: Has completed antibiotic regimen. Encourage pulmonary hygiene. Wean supplemental oxygen as tolerated--has been using prn in the month PTA.    15. RLQ pain, N/V: Symptoms resolved this morning   -Both KUB and CT of the abdomen are unremarkable.  -He has been moving his bowels  -May be musculoskeletal pain  -Provide analgesia as needed and continue to observe.   LOS: 2 days A FACE TO FACE EVALUATION WAS PERFORMED  Meredith Staggers 04/28/2018, 8:57 AM

## 2018-04-28 NOTE — Progress Notes (Signed)
[facility]    NEPHROLOGY PROGRESS NOTE  SUBJECTIVE:  Moved to CIR Friday.  No complaints this AM.      OBJECTIVE:  Vitals:   04/28/18 0357 04/28/18 1407  BP: 104/73 92/65  Pulse: 79 85  Resp: (!) 26 20  Temp: 98.5 F (36.9 C) 99 F (37.2 C)  SpO2: 98% 100%    Intake/Output Summary (Last 24 hours) at 04/28/2018 1416 Last data filed at 04/28/2018 1222 Gross per 24 hour  Intake 500 ml  Output 1500 ml  Net -1000 ml      Genearl:  AAOx3 NAD HEENT: MMM Circle AT anicteric sclera Neck:  No JVD, RIJ TDC c/d/i CV:  Heart RRR  Lungs:  L/S CTA bilaterally Abd:  abd SNT/ND with normal BS GU:  Bladder non-palpable Extremities:  No LE edema. LUE BB AVF thrill and bruit Skin:  No skin rash  MEDICATIONS:  . allopurinol  100 mg Oral Daily  . amiodarone  200 mg Oral Daily  . atorvastatin  40 mg Oral q1800  . Chlorhexidine Gluconate Cloth  6 each Topical Q0600  . feeding supplement  1 Container Oral TID BM  . feeding supplement (PRO-STAT SUGAR FREE 64)  30 mL Oral BID  . insulin aspart  0-5 Units Subcutaneous QHS  . insulin aspart  0-9 Units Subcutaneous TID WC  . insulin glargine  22 Units Subcutaneous QHS  . nystatin  5 mL Oral QID  . polyethylene glycol  17 g Oral Daily  . senna-docusate  1 tablet Oral BID  . sevelamer carbonate  800 mg Oral TID WC  . warfarin  7.5 mg Oral ONCE-1800  . Warfarin - Pharmacist Dosing Inpatient   Does not apply q1800       LABS:   CBC Latest Ref Rng & Units 04/28/2018 04/27/2018 04/26/2018  WBC 4.0 - 10.5 K/uL 4.2 6.0 7.3  Hemoglobin 13.0 - 17.0 g/dL 8.5(L) 9.3(L) 9.8(L)  Hematocrit 39.0 - 52.0 % 29.0(L) 30.0(L) 31.9(L)  Platelets 150 - 400 K/uL 251 260 272    CMP Latest Ref Rng & Units 04/27/2018 04/26/2018 04/25/2018  Glucose 70 - 99 mg/dL 155(H) 117(H) 186(H)  BUN 6 - 20 mg/dL 47(H) 26(H) 64(H)  Creatinine 0.61 - 1.24 mg/dL 10.52(H) 7.58(H) 13.49(H)  Sodium 135 - 145 mmol/L 128(L) 135 129(L)  Potassium 3.5 - 5.1 mmol/L  4.0 3.9 4.6  Chloride 98 - 111 mmol/L 93(L) 100 92(L)  CO2 22 - 32 mmol/L 22 25 17(L)  Calcium 8.9 - 10.3 mg/dL 8.7(L) 8.6(L) 9.1  Total Protein 6.5 - 8.1 g/dL - - -  Total Bilirubin 0.3 - 1.2 mg/dL - - -  Alkaline Phos 38 - 126 U/L - - -  AST 15 - 41 U/L - - -  ALT 0 - 44 U/L - - -    Lab Results  Component Value Date   CALCIUM 8.7 (L) 04/27/2018   CAION 1.19 04/15/2018   PHOS 4.2 04/27/2018       Component Value Date/Time   COLORURINE YELLOW 03/28/2018 1040   APPEARANCEUR CLEAR 03/28/2018 1040   LABSPEC 1.015 03/28/2018 1040   PHURINE 7.0 03/28/2018 1040   GLUCOSEU 50 (A) 03/28/2018 1040   HGBUR NEGATIVE 03/28/2018 1040   BILIRUBINUR NEGATIVE 03/28/2018 1040   KETONESUR NEGATIVE 03/28/2018 1040   PROTEINUR 100 (A) 03/28/2018 1040   UROBILINOGEN 0.2 12/18/2006 0428   NITRITE NEGATIVE 03/28/2018 1040   LEUKOCYTESUR NEGATIVE 03/28/2018 1040      Component Value Date/Time  PHART 7.330 (L) 04/15/2018 0435   PCO2ART 47.7 04/15/2018 0435   PO2ART 101.0 04/15/2018 0435   HCO3 25.1 04/15/2018 0435   TCO2 27 04/15/2018 0435   ACIDBASEDEF 1.0 04/15/2018 0435   O2SAT 97.0 04/15/2018 0435       Component Value Date/Time   IRON 23 (L) 04/27/2018 1316   TIBC 197 (L) 04/27/2018 1316   FERRITIN 724 (H) 04/27/2018 1316   IRONPCTSAT 12 (L) 04/27/2018 1316       ASSESSMENT/PLAN:     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 S/p permacath and fistula 2/13. HD yesterday, TTS schedule (also outpt schedule); had spot to start 2/18 at [facility] but now in CIR.   2.Anemia:Hemoglobin 9.5 now. Iron sat 12%, start iron load and low dose ESA.   3.  Hyponatremia: Reminded of 1295mL fluid restriction.   4. History of atrial fibrillation/atrial flutter status post DCCV and ablation.He is currently rate controlled  5. Acute exacerbation of CHF with cardiogenic shock:per primary, appears resolved.  Maintain euvolemia with  UF.   6.  Hyperphosphatemia.  We will continue phosphate binder. controlled  7.  DM.  Per primary  8. Dispo: [place] TTS 2/18 start 12:25 (on hold for CIR).   [place] for discharge from nephrology perspective.    Jannifer Hick MD [facility] Pager [phone]

## 2018-04-28 NOTE — Progress Notes (Signed)
Returned from HD at 2320. PRN PO compazine given at 2340, for C/O nausea. Patient refused pro stat and boost. Requested ginger ale and crackers with peanut butter. Without C/O pain, since returning from HD. Heparin IV at 14.70ml/hr.Travis Bryan A

## 2018-04-28 NOTE — Progress Notes (Signed)
[place] for Heparin and Coumadin Indication: atrial fibrillation  Patient Measurements: Height: 5\' 10"  (177.8 cm) Weight: 223 lb 15.8 oz (101.6 kg) IBW/kg (Calculated) : 73  Heparin dosing weight: 99 kg  Vital Signs: Temp: 98.5 F (36.9 C) (02/16 0357) Temp Source: Oral (02/15 2257) BP: 104/73 (02/16 0357) Pulse Rate: 79 (02/16 0357)  Labs: Recent Labs    04/26/18 0305 04/26/18 1547 04/27/18 0715 04/27/18 1316 04/28/18 0645  HGB 9.8*  --  9.3*  --  8.5*  HCT 31.9*  --  30.0*  --  29.0*  PLT 272  --  260  --  251  LABPROT 17.0*  --  16.4*  --  17.2*  INR 1.40  --  1.34  --  1.43  HEPARINUNFRC  --  0.40 0.41  --  0.61  CREATININE 7.58*  --   --  10.52*  --     Assessment: 48 year old male presenting on 03/28/18 with NV and fever, h/o afib on Coumadin PTA.  Coumadin held for procedures, Vitamin K given 2/4.  Pharmacy was consulted for heparin while off Coumadin. Coumadin resumed on 2/14 s/p TDC placement on 04/25/18. Transferred to CIR on 04/26/18.  Heparin is currently therapeutic on 1450 units/hr.  Given slow rise in INR, considered increasing Coumadin dose.  However with decreased PO intake due to nausea/abd pain, will repeat 7.5mg  Coumadin dose.  PTA Coumadin dose: 7.5 mg MWF, 5 mg all other days.  Goal of Therapy:  INR 2-3 Heparin level 0.3-0.7 units/ml Monitor platelets by anticoagulation protocol: Yes   Plan:  Continue heparin drip at 1450 units/hr Repeat Coumadin 7.5 mg today. Daily heparin level, PT/INR and CBC.  [employer], Pharm.D., BCPS Clinical Pharmacist  **Pharmacist phone directory can now be found on [email or web address] ([email or web address]).  Listed under [place].  04/28/2018 8:47 AM

## 2018-04-29 ENCOUNTER — Inpatient Hospital Stay (HOSPITAL_COMMUNITY): Payer: Self-pay | Admitting: Physical Therapy

## 2018-04-29 ENCOUNTER — Inpatient Hospital Stay (HOSPITAL_COMMUNITY): Payer: Self-pay | Admitting: Occupational Therapy

## 2018-04-29 ENCOUNTER — Inpatient Hospital Stay (HOSPITAL_COMMUNITY): Payer: Self-pay | Admitting: Speech Pathology

## 2018-04-29 ENCOUNTER — Encounter: Payer: Self-pay | Admitting: *Deleted

## 2018-04-29 LAB — RENAL FUNCTION PANEL
ALBUMIN: 1.9 g/dL — AB (ref 3.5–5.0)
Anion gap: 11 (ref 5–15)
BUN: 36 mg/dL — ABNORMAL HIGH (ref 6–20)
CO2: 26 mmol/L (ref 22–32)
Calcium: 8.2 mg/dL — ABNORMAL LOW (ref 8.9–10.3)
Chloride: 96 mmol/L — ABNORMAL LOW (ref 98–111)
Creatinine, Ser: 9.78 mg/dL — ABNORMAL HIGH (ref 0.61–1.24)
GFR calc Af Amer: 7 mL/min — ABNORMAL LOW (ref >60)
GFR calc non Af Amer: 6 mL/min — ABNORMAL LOW (ref >60)
Glucose, Bld: 110 mg/dL — ABNORMAL HIGH (ref 70–99)
PHOSPHORUS: 3.8 mg/dL (ref 2.5–4.6)
Potassium: 3.6 mmol/L (ref 3.5–5.1)
Sodium: 133 mmol/L — ABNORMAL LOW (ref 135–145)

## 2018-04-29 LAB — CBC
HCT: 28.6 % — ABNORMAL LOW (ref 39.0–52.0)
Hemoglobin: 8.6 g/dL — ABNORMAL LOW (ref 13.0–17.0)
MCH: 28.4 pg (ref 26.0–34.0)
MCHC: 30.1 g/dL (ref 30.0–36.0)
MCV: 94.4 fL (ref 80.0–100.0)
NRBC: 0 % (ref 0.0–0.2)
Platelets: 269 10*3/uL (ref 150–400)
RBC: 3.03 MIL/uL — AB (ref 4.22–5.81)
RDW: 15.5 % (ref 11.5–15.5)
WBC: 4 10*3/uL (ref 4.0–10.5)

## 2018-04-29 LAB — PROTIME-INR
INR: 1.88
Prothrombin Time: 21.4 seconds — ABNORMAL HIGH (ref 11.4–15.2)

## 2018-04-29 LAB — GLUCOSE, CAPILLARY
Glucose-Capillary: 90 mg/dL (ref 70–99)
Glucose-Capillary: 146 mg/dL — ABNORMAL HIGH (ref 70–99)
Glucose-Capillary: 137 mg/dL — ABNORMAL HIGH (ref 70–99)
Glucose-Capillary: 99 mg/dL (ref 70–99)

## 2018-04-29 LAB — HEPARIN LEVEL (UNFRACTIONATED): Heparin Unfractionated: 0.69 IU/mL (ref 0.30–0.70)

## 2018-04-29 MED ORDER — WARFARIN SODIUM 5 MG PO TABS
5.0000 mg | ORAL_TABLET | Freq: Once | ORAL | Status: AC
  Administered 2018-04-29: 5 mg via ORAL
  Filled 2018-04-29: qty 1

## 2018-04-29 NOTE — Progress Notes (Signed)
Physical Therapy Session Note  Patient Details  Name: Travis Bryan MRN: [id number] Date of Birth: May 25, 1970  Today's Date: 04/29/2018 PT Individual Time: 3568-6168 PT Individual Time Calculation (min): 63 min   Short Term Goals: Week 1:  PT Short Term Goal 1 (Week 1): STG=LTG due to ELOS   Skilled Therapeutic Interventions/Progress Updates:    pt performs gait 150' x 5 during session for endurance training with supervision without AD.  Stair negotiation x 4 stairs with bilat handrails with supervision.  Berg balance test completed with pt at risk for falls, pt educated on fall risk. [place] exercises performed with 3# wts bilat LEs.  nustep x 8 minutes level 5 for UE/LE strength and endurance. Attempted to have pt perform session on room air, spO2 drops to 86%. Pt able to perform full session on 1L O2 with spO2 >94%. Pt left in bed with needs at hand, alarm set.  Therapy Documentation Precautions:  Precautions Precautions: Fall Precaution Comments: watch O2 Restrictions Weight Bearing Restrictions: No Pain:  pt c/o pain at dialysis site after treatment, RN made aware  Balance: Standardized Balance Assessment Standardized Balance Assessment: Berg Balance Test Berg Balance Test Sit to Stand: Able to stand  independently using hands Standing Unsupported: Able to stand 2 minutes with supervision Sitting with Back Unsupported but Feet Supported on Floor or Stool: Able to sit safely and securely 2 minutes Stand to Sit: Sits safely with minimal use of hands Transfers: Able to transfer safely, definite need of hands Standing Unsupported with Eyes Closed: Able to stand 10 seconds with supervision Standing Ubsupported with Feet Together: Needs help to attain position but able to stand for 30 seconds with feet together From Standing, Reach Forward with Outstretched Arm: Reaches forward but needs supervision From Standing Position, Pick up Object from Floor: Able to pick up shoe, needs  supervision From Standing Position, Turn to Look Behind Over each Shoulder: Looks behind from both sides and weight shifts well Turn 360 Degrees: Needs close supervision or verbal cueing Standing Unsupported, Alternately Place Feet on Step/Stool: Able to complete >2 steps/needs minimal assist Standing Unsupported, One Foot in Front: Able to take small step independently and hold 30 seconds Standing on One Leg: Able to lift leg independently and hold equal to or more than 3 seconds Total Score: 35    Therapy/Group: Individual Therapy  Charisse Wendell 04/29/2018, 11:24 AM

## 2018-04-29 NOTE — Progress Notes (Signed)
Meredith Staggers, MD  Physician  Physical Medicine and Rehabilitation  Consult Note  Signed  Date of Service:  04/18/2018 5:49 AM       Related encounter: ED to Hosp-Admission (Discharged) from 03/28/2018 in [place] [facility]      Signed      Expand All Collapse All    Show:Clear all _0 Manual_1 Template_2 Copied  Added by: _3 Cathlyn Parsons, PA-C_4 Meredith Staggers, MD  _5 Hover for details      Physical Medicine and Rehabilitation Consult Reason for Consult:  Decreased functional mobility Referring Physician: Critical care   HPI: Travis Bryan is a 48 y.o.right handed male with history of nonischemic cardiomyopathy/AICD,chronic systolic congestive heart failure, atrial flutter maintained on Coumadin,CKD 3-4, hypertension and diabetes mellitus, quit smoking 15 years ago as well as a recent 4 day hospitalization for pneumonia approximately one month ago. Per chart review patient independent at baseline prior to admission. By report he lives with 2 aunts. Presented 03/28/2018 with low-grade fever 103, tachypneic, creatinine 2.5, BNP 683,lactic acid 0.8, troponin 0.12, urine culture no growth, influenza panel negative, urine drug screen negative. Chest x-ray with cardiomegaly, bilateral pulmonary infiltrates and vascular congestion.Patient developed increasing shortness of breath respiratory failure requiring intubation as well as PEA arrest with follow-up cardiology services. Maintained on broad-spectrum antibiotics. Renal services consulted for increasing creatinine from baseline 2.5-2.8 with latest creatinine 5.99 with CRRT initiated and transitioning to hemodialysis. Patient did remain intubated through 04/16/2018.Patient remains on Coumadin as prior to admission. Currently maintained on a dysphagia #2 Honey thick liquid diet. Therapy evaluations completed 04/17/2018 with recommendations of physical medicine rehabilitation consult.   Review of  Systems  Constitutional: Positive for fever.  HENT: Negative for hearing loss.   Eyes: Negative for blurred vision and double vision.  Respiratory: Positive for cough and shortness of breath.   Cardiovascular: Positive for palpitations and leg swelling. Negative for chest pain.  Gastrointestinal: Positive for constipation. Negative for nausea and vomiting.  Genitourinary: Negative for dysuria, flank pain and hematuria.  Musculoskeletal: Positive for myalgias.  Skin: Negative for rash.  All other systems reviewed and are negative.      Past Medical History:  Diagnosis Date  . AICD (automatic cardioverter/defibrillator) present   . Atrial flutter ([place])    a. s/p ablation 03/2016  . Chronic systolic CHF (congestive heart failure) ([place])   . HCAP (healthcare-associated pneumonia) 03/28/2018  . History of gout   . Hyperlipidemia   . Hypertension   . Myocardial infarction [facility])    "I think I had one a long long time ago" (03/28/2016)  . NICM (nonischemic cardiomyopathy) ([place])    a. LHC 6/06: pLAD 20, pLCx 20-30; b. Echo 5/15:  EF 15%, diffuse HK, restrictive physiology, trivial AI, trivial MR, mild LAE, moderate RVE, moderately reduced RVSF, moderate RAE, mild to moderate TR, PASP 43 mmHg  . Obesity   . Persistent atrial fibrillation   . Type II diabetes mellitus ([place])         Past Surgical History:  Procedure Laterality Date  . CARDIOVERSION N/A 04/07/2016   Procedure: CARDIOVERSION;  Surgeon: Thayer Headings, MD;  Location: [facility] ENDOSCOPY;  Service: Cardiovascular;  Laterality: N/A;  . CARDIOVERSION N/A 04/11/2016   Procedure: CARDIOVERSION;  Surgeon: Larey Dresser, MD;  Location: [place];  Service: Cardiovascular;  Laterality: N/A;  . ELECTROPHYSIOLOGIC STUDY N/A 03/31/2016   Procedure: A-Flutter Ablation;  Surgeon: Evans Lance, MD;  Location: [place] CV LAB;  Service: Cardiovascular;  Laterality:  N/A;  . IMPLANTABLE CARDIOVERTER DEFIBRILLATOR (ICD)  GENERATOR CHANGE N/A 08/27/2013   Procedure: ICD GENERATOR CHANGE;  Surgeon: Evans Lance, MD;  Location: [facility] CATH LAB;  Service: Cardiovascular;  Laterality: N/A;  . TEE WITHOUT CARDIOVERSION N/A 03/31/2016   Procedure: TRANSESOPHAGEAL ECHOCARDIOGRAM (TEE);  Surgeon: Larey Dresser, MD;  Location: [facility] ENDOSCOPY;  Service: Cardiovascular;  Laterality: N/A;        Family History  Problem Relation Age of Onset  . Hypertension Mother   . Heart disease Mother   . Diabetes Mother    Social History:  reports that he quit smoking about 15 years ago. His smoking use included cigars. He quit after 10.00 years of use. He has never used smokeless tobacco. He reports that he does not drink alcohol or use drugs. Allergies:      Allergies  Allergen Reactions  . No Known Allergies          Medications Prior to Admission  Medication Sig Dispense Refill  . amiodarone (PACERONE) 200 MG tablet Take 1 tablet (200 mg total) by mouth daily. 30 tablet 5  . amLODipine (NORVASC) 5 MG tablet Take 1 tablet (5 mg total) by mouth daily. 30 tablet 6  . carvedilol (COREG) 25 MG tablet TAKE 1 TABLET BY MOUTH TWICE A DAY WITH A MEAL. (Patient taking differently: Take 25 mg by mouth 2 (two) times daily with a meal. ) 60 tablet 3  . colchicine 0.6 MG tablet Take 1 tablet (0.6 mg total) by mouth daily. 30 tablet 0  . hydrALAZINE (APRESOLINE) 100 MG tablet TAKE 1 TABLET BY MOUTH 3 TIMES DAILY. (Patient taking differently: Take 100 mg by mouth 3 (three) times daily. ) 90 tablet 5  . insulin aspart (NOVOLOG) 100 UNIT/ML injection Substitute to any brand approved.Before each meal 3 times a day, 140-199 - 2 units, 200-250 - 4 units, 251-299 - 6 units,  300-349 - 8 units,  350 or above 10 units. Dispense syringes and needles as needed, Ok to switch to PEN if approved. DX DM2, Code E11.65 (Patient taking differently: Inject 10 Units into the skin See admin instructions. Substitute to any brand approved.Before each meal 3  times a day, 140-199 - 2 units, 200-250 - 4 units, 251-299 - 6 units,  300-349 - 8 units,  350 or above 10 units. Dispense syringes and needles as needed, Ok to switch to PEN if approved. DX DM2, Code E11.65) 1 vial 0  . insulin glargine (LANTUS) 100 UNIT/ML injection Inject 0.25 mLs (25 Units total) into the skin 2 (two) times daily. Dispense insulin pen if approved, if not dispense as needed syringes and needles for 1 month supply. Can switch to Levemir. Diagnosis E 11.65. (Patient taking differently: Inject 25 Units into the skin at bedtime. ) 10 mL 0  . potassium chloride SA (K-DUR,KLOR-CON) 20 MEQ tablet TAKE 2 TABLETS BY MOUTH IN THE MORNING AND 1 TABLET IN THE EVENING (Patient taking differently: Take 40 mEq by mouth See admin instructions. TAKE 40 mg  IN THE MORNING AND 20 mg TABLET IN THE EVENING) 90 tablet 5  . ranolazine (RANEXA) 500 MG 12 hr tablet TAKE 1 TABLET (500 MG TOTAL) BY MOUTH 2 (TWO) TIMES DAILY. 60 tablet 5  . torsemide (DEMADEX) 20 MG tablet Take 3 tablets (60 mg total) by mouth daily. 90 tablet 5  . warfarin (COUMADIN) 5 MG tablet Take 1 tablet (5 mg total) by mouth daily at 6 PM. Take As Directed by [facility]madin Clinic (  Patient taking differently: Take 5-7.5 mg by mouth daily at 6 PM. Take 7.5 mg on Mon. Wed. Fri Take 5 mg tues, thurs, sat. sunday)    . allopurinol (ZYLOPRIM) 300 MG tablet Take 300 mg by mouth daily.     Marland Kitchen amoxicillin-clavulanate (AUGMENTIN) 875-125 MG tablet Take 1 tablet by mouth every 12 (twelve) hours. (Patient not taking: Reported on 03/28/2018) 20 tablet 0  . atorvastatin (LIPITOR) 40 MG tablet Take 1 tablet (40 mg total) by mouth daily. (Patient not taking: Reported on 03/28/2018) 90 tablet 3  . blood glucose meter kit and supplies KIT Dispense based on patient and insurance preference. Use up to four times daily as directed. (FOR ICD-9 250.00, 250.01). For QAC - HS accuchecks. 1 each 1  . calcitRIOL (ROCALTROL) 0.25 MCG capsule Take 0.25 mcg by mouth daily.     . diclofenac sodium (VOLTAREN) 1 % GEL Apply 2 g topically 4 (four) times daily. 1 Tube 0  . Insulin Syringe-Needle U-100 25G X 1" 1 ML MISC For 4 times a day insulin SQ, 1 month supply. Diagnosis E11.65 30 each 0  . Insulin Syringe-Needle U-100 31G X 15/64" 0.3 ML MISC Use to inject insulin 4 times a day 200 each 12  . isosorbide mononitrate (IMDUR) 30 MG 24 hr tablet TAKE 3 TABLETS (90 MG TOTAL) BY MOUTH DAILY. 270 tablet 3  . magnesium oxide (MAG-OX) 400 (241.3 Mg) MG tablet Take 1 tablet (400 mg total) by mouth daily. 30 tablet 6  . metolazone (ZAROXOLYN) 2.5 MG tablet TAKE 1 TABLET BY MOUTH AS NEEDED AS DIRECTED (Patient taking differently: Take 2.5 mg by mouth as needed (fluid). ) 5 tablet 0    Home: Home Living Family/patient expects to be discharged to:: Private residence Living Arrangements: Other relatives Type of Home: House Additional Comments: Patient vauge with responses for home set up and PLOF. Pt reporting he lives with to aunts.  Functional History: Prior Function Level of Independence: Independent Comments: Pt reporting he was independent and was looking for a job.  Functional Status:  Mobility: Bed Mobility Overal bed mobility: Needs Assistance Bed Mobility: Supine to Sit Supine to sit: Min assist General bed mobility comments: Pt at EOB with PT Transfers Overall transfer level: Needs assistance Equipment used: 2 person hand held assist Transfers: Sit to/from Stand, Stand Pivot Transfers Sit to Stand: Min assist, +2 physical assistance Stand pivot transfers: Min assist, +2 physical assistance General transfer comment: Min A +2 for power up into standing. Pt requiring Min A +2 and two person hand held assist.  Ambulation/Gait General Gait Details: deferred to tolerance   ADL: ADL Overall ADL's : Needs assistance/impaired Eating/Feeding: Supervision/ safety, Set up, Sitting Eating/Feeding Details (indicate cue type and reason): Full supervision  required Grooming: Oral care, Min guard, Cueing for sequencing, Cueing for safety, Sitting Grooming Details (indicate cue type and reason): Pt sitting at EOB with Min Guard A to perform oral care. Noting undershooting as pt would reach for swab or cup. Pt denies double vision. Pt requiring cues for sequencing.  Upper Body Bathing: Min guard, Sitting Lower Body Bathing: Minimal assistance, +2 for safety/equipment, Sit to/from stand Upper Body Dressing : Min guard, Sitting Lower Body Dressing: Moderate assistance, Sit to/from stand Lower Body Dressing Details (indicate cue type and reason): Mod A due to poor coorindation and vision for donning socks Toilet Transfer: Minimal assistance, +2 for physical assistance, Stand-pivot(simulated to recliner) Functional mobility during ADLs: Minimal assistance, +2 for physical assistance, +2 for safety/equipment,  Cueing for safety, Cueing for sequencing General ADL Comments: Pt presenting with decreased cognition, balance, strength, vision, and activity tolerance. Motivated to participate with therapy to get OOB to recliner  Cognition: Cognition Overall Cognitive Status: Impaired/Different from baseline Orientation Level: Oriented to person, Oriented to place, Disoriented to time, Disoriented to situation Cognition Arousal/Alertness: Awake/alert Behavior During Therapy: Flat affect Overall Cognitive Status: Impaired/Different from baseline Area of Impairment: Attention, Following commands, Memory, Safety/judgement, Awareness, Problem solving Current Attention Level: Sustained Memory: Decreased short-term memory Following Commands: Follows one step commands inconsistently, Follows one step commands with increased time Safety/Judgement: Decreased awareness of safety Awareness: Emergent Problem Solving: Slow processing, Decreased initiation, Requires verbal cues General Comments: Pt oriented to place, time, and sitation. Pt requiring increased time and  cues. Decreased attention to task and poor problem solving.   Blood pressure 96/61, pulse 73, temperature (!) 97.4 F (36.3 C), temperature source Oral, resp. rate 18, height _0  (1.778 m), weight 94.8 kg, SpO2 95 %. Physical Exam  Constitutional: No distress.  HENT:  Head: Normocephalic.  Eyes: Pupils are equal, round, and reactive to light.  Neck: Normal range of motion.  Cardiovascular: Normal rate.  Respiratory: Effort normal.  GI: He exhibits distension.  Musculoskeletal:        General: Edema present.  Neurological:  Patient is lethargic but arousable. Mood is very flat.Patient with dysphonia. He does make eye contact with examiner. Follows some simple commands. He was able to state his first name and age. Exam overall limited due to lethargy. UE 3/5. LE 2-3/5 prox to distal. Withdraws to pain.   Skin: Skin is warm.  Psychiatric:  flat    LabResultsLast24Hours  Results for orders placed or performed during the hospital encounter of 03/28/18 (from the past 24 hour(s))  Protime-INR     Status: Abnormal   Collection Time: 04/17/18  6:58 AM  Result Value Ref Range   Prothrombin Time 16.9 (H) 11.4 - 15.2 seconds   INR 1.39   Renal function panel     Status: Abnormal   Collection Time: 04/17/18  6:58 AM  Result Value Ref Range   Sodium 136 135 - 145 mmol/L   Potassium 3.6 3.5 - 5.1 mmol/L   Chloride 95 (L) 98 - 111 mmol/L   CO2 19 (L) 22 - 32 mmol/L   Glucose, Bld 173 (H) 70 - 99 mg/dL   BUN 58 (H) 6 - 20 mg/dL   Creatinine, Ser 5.99 (H) 0.61 - 1.24 mg/dL   Calcium 9.9 8.9 - 10.3 mg/dL   Phosphorus 5.4 (H) 2.5 - 4.6 mg/dL   Albumin 2.6 (L) 3.5 - 5.0 g/dL   GFR calc non Af Amer 10 (L) >60 mL/min   GFR calc Af Amer 12 (L) >60 mL/min   Anion gap 22 (H) 5 - 15  CBC     Status: Abnormal   Collection Time: 04/17/18  6:58 AM  Result Value Ref Range   WBC 10.3 4.0 - 10.5 K/uL   RBC 4.40 4.22 - 5.81 MIL/uL   Hemoglobin 12.9 (L) 13.0 - 17.0 g/dL    HCT 41.6 39.0 - 52.0 %   MCV 94.5 80.0 - 100.0 fL   MCH 29.3 26.0 - 34.0 pg   MCHC 31.0 30.0 - 36.0 g/dL   RDW 16.2 (H) 11.5 - 15.5 %   Platelets 354 150 - 400 K/uL   nRBC 0.4 (H) 0.0 - 0.2 %  Glucose, capillary     Status: Abnormal   Collection  Time: 04/17/18  7:42 AM  Result Value Ref Range   Glucose-Capillary 157 (H) 70 - 99 mg/dL  Glucose, capillary     Status: Abnormal   Collection Time: 04/17/18 11:43 AM  Result Value Ref Range   Glucose-Capillary 172 (H) 70 - 99 mg/dL  Glucose, capillary     Status: Abnormal   Collection Time: 04/17/18  5:05 PM  Result Value Ref Range   Glucose-Capillary 229 (H) 70 - 99 mg/dL  Heparin level (unfractionated)     Status: Abnormal   Collection Time: 04/17/18  5:25 PM  Result Value Ref Range   Heparin Unfractionated 0.12 (L) 0.30 - 0.70 IU/mL  Renal function panel (daily at 1600)     Status: Abnormal   Collection Time: 04/17/18  5:30 PM  Result Value Ref Range   Sodium 135 135 - 145 mmol/L   Potassium 3.7 3.5 - 5.1 mmol/L   Chloride 96 (L) 98 - 111 mmol/L   CO2 15 (L) 22 - 32 mmol/L   Glucose, Bld 225 (H) 70 - 99 mg/dL   BUN 70 (H) 6 - 20 mg/dL   Creatinine, Ser 7.03 (H) 0.61 - 1.24 mg/dL   Calcium 10.0 8.9 - 10.3 mg/dL   Phosphorus 5.7 (H) 2.5 - 4.6 mg/dL   Albumin 2.7 (L) 3.5 - 5.0 g/dL   GFR calc non Af Amer 8 (L) >60 mL/min   GFR calc Af Amer 10 (L) >60 mL/min   Anion gap 24 (H) 5 - 15  Glucose, capillary     Status: Abnormal   Collection Time: 04/17/18  9:15 PM  Result Value Ref Range   Glucose-Capillary 225 (H) 70 - 99 mg/dL      ImagingResults(Last48hours)  Dg Chest Port 1 View  Result Date: 04/17/2018 CLINICAL DATA:  Respiratory failure EXAM: PORTABLE CHEST 1 VIEW COMPARISON:  Yesterday FINDINGS: Single chamber ICD lead into the right ventricle. Low volume chest but improved inflation and decreasing atelectasis. Left IJ line in stable position. The trachea and esophagus have been  extubated. IMPRESSION: Improved inflation after extubation. Lung volumes remain low and there is still lower lobe atelectasis. Electronically Signed   By: Monte Fantasia M.D.   On: 04/17/2018 08:32   Dg Swallowing Func-speech Pathology  Result Date: 04/17/2018 Objective Swallowing Evaluation: Type of Study: MBS-Modified Barium Swallow Study  Patient Details Name: Travis Bryan MRN: [id number] Date of Birth: 1970-06-13 Today's Date: 04/17/2018 Time: SLP Start Time (ACUTE ONLY): 1110 -SLP Stop Time (ACUTE ONLY): 1130 SLP Time Calculation (min) (ACUTE ONLY): 20 min Past Medical History: Past Medical History: Diagnosis Date . AICD (automatic cardioverter/defibrillator) present  . Atrial flutter ([place])   a. s/p ablation 03/2016 . Chronic systolic CHF (congestive heart failure) ([place])  . HCAP (healthcare-associated pneumonia) 03/28/2018 . History of gout  . Hyperlipidemia  . Hypertension  . Myocardial infarction [facility])   "I think I had one a long long time ago" (03/28/2016) . NICM (nonischemic cardiomyopathy) ([place])   a. LHC 6/06: pLAD 20, pLCx 20-30; b. Echo 5/15:  EF 15%, diffuse HK, restrictive physiology, trivial AI, trivial MR, mild LAE, moderate RVE, moderately reduced RVSF, moderate RAE, mild to moderate TR, PASP 43 mmHg . Obesity  . Persistent atrial fibrillation  . Type II diabetes mellitus ([place])  Past Surgical History: Past Surgical History: Procedure Laterality Date . CARDIOVERSION N/A 04/07/2016  Procedure: CARDIOVERSION;  Surgeon: Thayer Headings, MD;  Location: [place];  Service: Cardiovascular;  Laterality: N/A; . CARDIOVERSION N/A 04/11/2016  Procedure: CARDIOVERSION;  Surgeon: Larey Dresser, MD;  Location: [place];  Service: Cardiovascular;  Laterality: N/A; . ELECTROPHYSIOLOGIC STUDY N/A 03/31/2016  Procedure: A-Flutter Ablation;  Surgeon: Evans Lance, MD;  Location: [place] CV LAB;  Service: Cardiovascular;  Laterality: N/A; . IMPLANTABLE CARDIOVERTER DEFIBRILLATOR (ICD) GENERATOR CHANGE  N/A 08/27/2013  Procedure: ICD GENERATOR CHANGE;  Surgeon: Evans Lance, MD;  Location: [facility] CATH LAB;  Service: Cardiovascular;  Laterality: N/A; . TEE WITHOUT CARDIOVERSION N/A 03/31/2016  Procedure: TRANSESOPHAGEAL ECHOCARDIOGRAM (TEE);  Surgeon: Larey Dresser, MD;  Location: [facility] ENDOSCOPY;  Service: Cardiovascular;  Laterality: N/A; HPI: Pt is a 48 year old male with HTN, cardiomyopathy with AICD, atrial fibrillation, CHF and diabetes presenting with a one day history of chills and vomiting. Pt admitted 03/29/18 for acute hypoxemic respiratory failure secondary to CHF exacerbation and pneumonia. Suffered PEA arrest on 1/20 after episode of emesis and likely aspiration.  He was previously admitted with DKA and pneumonia in 12/19 with a similar presentation. Intubated 1/20- 2/4. CXR 1/30 revealed: Bilateral LOWER lobe consolidation involving the majority of the RIGHT LOWER lobe and much of the LEFT LOWER lobe, likely representing pneumonia. Mild bibasilar atelectasis. Mild bilateral ground-glass pulmonary opacities which are nonspecific but may represent mild edema. No pleural effusion or pneumothorax.  No data recorded Assessment / Plan / Recommendation CHL IP CLINICAL IMPRESSIONS 04/17/2018 Clinical Impression Pt demonstrates moderate oropharyngeal dysphagia with sensory deficits and decreased laryngeal closure leading to silent aspriation before and during the swallow with liquids thinner than honey. Decreasing nectar thick bolus to teaspoon size to contain bolus in the valleculae prior to swallow initiation is also a helpful strategy, Otherwise, liquids spill into the vestibule and fall past the cords before/during the swallow with intermittent delayed cough. Honey thick liquids tolerated with penetration or very trace aspiration even if bolus quite large. Cued throat clear ejects material. Oral phase prolonged due to poor dentition but may be baseline. Given pts deconditioning and high risk for fatigue will  initaite a dys 2 diet with honey thick liquids. As mentation and generalized strength improve and vocal quality become clearer would expect improvement in timing and airway protection. In the meantime will train pt in small sips and throat clearing as compensatory strategies.  SLP Visit Diagnosis Dysphagia, oropharyngeal phase (R13.12) Attention and concentration deficit following -- Frontal lobe and executive function deficit following -- Impact on safety and function Moderate aspiration risk   CHL IP TREATMENT RECOMMENDATION 04/17/2018 Treatment Recommendations Therapy as outlined in treatment plan below   No flowsheet data found. CHL IP DIET RECOMMENDATION 04/17/2018 SLP Diet Recommendations Dysphagia 2 (Fine chop) solids;Honey thick liquids Liquid Administration via Cup Medication Administration Whole meds with puree Compensations Slow rate;Small sips/bites;Clear throat after each swallow Postural Changes Seated upright at 90 degrees   CHL IP OTHER RECOMMENDATIONS 04/17/2018 Recommended Consults -- Oral Care Recommendations Oral care BID Other Recommendations --   CHL IP FOLLOW UP RECOMMENDATIONS 04/17/2018 Follow up Recommendations Skilled Nursing facility   [facility] IP FREQUENCY AND DURATION 04/17/2018 Speech Therapy Frequency (ACUTE ONLY) min 2x/week Treatment Duration 2 weeks      CHL IP ORAL PHASE 04/17/2018 Oral Phase Impaired Oral - Pudding Teaspoon -- Oral - Pudding Cup -- Oral - Honey Teaspoon -- Oral - Honey Cup WFL Oral - Nectar Teaspoon WFL Oral - Nectar Cup WFL Oral - Nectar Straw WFL Oral - Thin Teaspoon -- Oral - Thin Cup -- Oral - Thin Straw -- Oral - Puree WFL Oral - Mech Soft Delayed oral  transit;Impaired mastication Oral - Regular -- Oral - Multi-Consistency -- Oral - Pill -- Oral Phase - Comment --  CHL IP PHARYNGEAL PHASE 04/17/2018 Pharyngeal Phase Impaired Pharyngeal- Pudding Teaspoon -- Pharyngeal -- Pharyngeal- Pudding Cup -- Pharyngeal -- Pharyngeal- Honey Teaspoon -- Pharyngeal -- Pharyngeal- Honey Cup  Delayed swallow initiation-pyriform sinuses;Delayed swallow initiation-vallecula;Reduced airway/laryngeal closure;Penetration/Aspiration before swallow;Penetration/Aspiration during swallow;Trace aspiration;Compensatory strategies attempted (with notebox) Pharyngeal Material does not enter airway;Material enters airway, CONTACTS cords and then ejected out;Material enters airway, CONTACTS cords and not ejected out;Material enters airway, passes BELOW cords without attempt by patient to eject out (silent aspiration) Pharyngeal- Nectar Teaspoon Delayed swallow initiation-vallecula;Reduced airway/laryngeal closure Pharyngeal Material does not enter airway Pharyngeal- Nectar Cup Delayed swallow initiation-pyriform sinuses;Delayed swallow initiation-vallecula;Reduced airway/laryngeal closure;Penetration/Aspiration before swallow;Penetration/Aspiration during swallow;Moderate aspiration Pharyngeal Material enters airway, passes BELOW cords without attempt by patient to eject out (silent aspiration);Material enters airway, passes BELOW cords and not ejected out despite cough attempt by patient Pharyngeal- Nectar Straw Delayed swallow initiation-pyriform sinuses;Delayed swallow initiation-vallecula;Reduced airway/laryngeal closure;Penetration/Aspiration before swallow;Penetration/Aspiration during swallow;Trace aspiration;Compensatory strategies attempted (with notebox) Pharyngeal Material enters airway, passes BELOW cords without attempt by patient to eject out (silent aspiration) Pharyngeal- Thin Teaspoon -- Pharyngeal -- Pharyngeal- Thin Cup -- Pharyngeal -- Pharyngeal- Thin Straw -- Pharyngeal -- Pharyngeal- Puree Delayed swallow initiation-vallecula Pharyngeal -- Pharyngeal- Mechanical Soft Delayed swallow initiation-vallecula Pharyngeal -- Pharyngeal- Regular -- Pharyngeal -- Pharyngeal- Multi-consistency -- Pharyngeal -- Pharyngeal- Pill -- Pharyngeal -- Pharyngeal Comment --  No flowsheet data found. Herbie Baltimore,  MA CCC-SLP Acute Rehabilitation Services Pager [phone] Office [phone] Lynann Beaver 04/17/2018, 12:01 PM                 Assessment/Plan: Diagnosis: debility related to bilateral pneumonia with respiratory failure, PEA cardiac arrest, renal faliure 1. Does the need for close, 24 hr/day medical supervision in concert with the patient's rehab needs make it unreasonable for this patient to be served in a less intensive setting? Yes 2. Co-Morbidities requiring supervision/potential complications: NICM, CKD, HTN, DM 3. Due to bladder management, bowel management, safety, skin/wound care, disease management, medication administration, pain management and patient education, does the patient require 24 hr/day rehab nursing? Yes 4. Does the patient require coordinated care of a physician, rehab nurse, PT (1-2 hrs/day, 5 days/week), OT (1-2 hrs/day, 5 days/week) and SLP (1-2 hrs/day, 5 days/week) to address physical and functional deficits in the context of the above medical diagnosis(es)? Yes Addressing deficits in the following areas: balance, endurance, locomotion, strength, transferring, bowel/bladder control, bathing, dressing, feeding, grooming, toileting, cognition and psychosocial support 5. Can the patient actively participate in an intensive therapy program of at least 3 hrs of therapy per day at least 5 days per week? Yes 6. The potential for patient to make measurable gains while on inpatient rehab is excellent 7. Anticipated functional outcomes upon discharge from inpatient rehab are modified independent and supervision  with PT, modified independent and supervision with OT, modified independent with SLP. 8. Estimated rehab length of stay to reach the above functional goals is: 11-18 days 9. Anticipated D/C setting: Home 10. Anticipated post D/C treatments: HH therapy and Outpatient therapy 11. Overall Rehab/Functional Prognosis: excellent  RECOMMENDATIONS: This  patient's condition is appropriate for continued rehabilitative care in the following setting: CIR Patient has agreed to participate in recommended program. Yes Note that insurance prior authorization may be required for reimbursement for recommended care.  Comment: Rehab Admissions Coordinator to follow up.  Thanks,  Meredith Staggers, MD, FAAPMR  I have  personally performed a face to face diagnostic evaluation of this patient. Additionally, I have reviewed and concur with the physician assistant's documentation above.    Lavon Paganini Angiulli, PA-C 04/18/2018        Revision History                   Routing History

## 2018-04-29 NOTE — Care Management (Signed)
[place] Individual Statement of Services  Patient Name:  Travis Bryan  Date:  04/29/2018  Welcome to the [facility].  Our goal is to provide you with an individualized program based on your diagnosis and situation, designed to meet your specific needs.  With this comprehensive rehabilitation program, you will be expected to participate in at least 3 hours of rehabilitation therapies Monday-Friday, with modified therapy programming on the weekends.  Your rehabilitation program will include the following services:  Physical Therapy (PT), Occupational Therapy (OT), 24 hour per day rehabilitation nursing, Case Management (Social Worker), Rehabilitation Medicine, Nutrition Services and Pharmacy Services  Weekly team conferences will be held on Tuesdays to discuss your progress.  Your Social Worker will talk with you frequently to get your input and to update you on team discussions.  Team conferences with you and your family in attendance may also be held.  Expected length of stay: 7 - 10 days   Overall anticipated outcome: supervision  Depending on your progress and recovery, your program may change. Your Social Worker will coordinate services and will keep you informed of any changes. Your Social Worker's name and contact numbers are listed  below.  The following services may also be recommended but are not provided by the [facility] will be made to provide these services after discharge if needed.  Arrangements include referral to agencies that provide these services.  Your insurance has been verified to be:  None - Medicaid app being completed while here. Your primary doctor is:  Nita Sickle (Internal Med clinic)  Pertinent information will be shared with your doctor and your  insurance company.  Social Worker:  [place], [place] or [phone]   Information discussed with and copy given to patient by: Lennart Pall, 04/29/2018, 3:02 PM

## 2018-04-29 NOTE — Progress Notes (Signed)
Social Work  Social Work Assessment and Plan  Patient Details  Name: Travis Bryan MRN: [id number] Date of Birth: Nov 09, 1970  Today's Date: 04/29/2018  Problem List:  Patient Active Problem List   Diagnosis Date Noted  . Debility 04/26/2018  . Hypoxia   . Post-operative state   . Anemia of chronic disease   . PAF (paroxysmal atrial fibrillation) ([place])   . Pressure injury of skin 04/17/2018  . Bilateral pneumonia 03/29/2018  . Acute respiratory failure with hypoxia ([place]) 03/28/2018  . DKA (diabetic ketoacidosis) ([place]) 02/25/2018  . Acute on chronic systolic heart failure, NYHA class 4 ([place]) 02/25/2018  . Hyperlipidemia 02/25/2018  . Atrial fibrillation ([place]) 02/25/2018  . CAD (coronary artery disease) 02/25/2018  . Elevated troponin 02/25/2018  . Sepsis due to pneumonia ([place]) 02/25/2018  . AKI (acute kidney injury) ([place]) 02/25/2018  . Type 2 diabetes mellitus with complication, with long-term current use of insulin ([place]) 12/26/2017  . At risk for sexually transmitted disease due to unprotected sex 08/23/2017  . Healthcare maintenance 06/24/2016  . Persistent atrial fibrillation   . CARDIOMYOPATHY, SECONDARY 07/03/2008  . HFrEF (heart failure with reduced ejection fraction) ([place]) 07/03/2008  . Automatic implantable cardioverter-defibrillator in situ 07/03/2008  . Type 2 diabetes mellitus ([place]) 12/26/2005  . Hyperlipidemia associated with type 2 diabetes mellitus ([place]) 12/26/2005  . Hypertension associated with diabetes ([place]) 12/26/2005   Past Medical History:  Past Medical History:  Diagnosis Date  . AICD (automatic cardioverter/defibrillator) present   . Atrial flutter ([place])    a. s/p ablation 03/2016  . Chronic systolic CHF (congestive heart failure) ([place])   . HCAP (healthcare-associated pneumonia) 03/28/2018  . History of gout   . Hyperlipidemia   . Hypertension   . Myocardial infarction [facility])    "I think I had one a long long time ago" (03/28/2016)  . NICM  (nonischemic cardiomyopathy) ([place])    a. LHC 6/06: pLAD 20, pLCx 20-30; b. Echo 5/15:  EF 15%, diffuse HK, restrictive physiology, trivial AI, trivial MR, mild LAE, moderate RVE, moderately reduced RVSF, moderate RAE, mild to moderate TR, PASP 43 mmHg  . Obesity   . Persistent atrial fibrillation   . Type II diabetes mellitus ([place])    Past Surgical History:  Past Surgical History:  Procedure Laterality Date  . AV FISTULA PLACEMENT Left 04/25/2018   Procedure: ARTERIOVENOUS (AV) VS GRAFT ARM;  Surgeon: Serafina Mitchell, MD;  Location: [facility];  Service: Vascular;  Laterality: Left;  . CARDIOVERSION N/A 04/07/2016   Procedure: CARDIOVERSION;  Surgeon: Thayer Headings, MD;  Location: [facility] ENDOSCOPY;  Service: Cardiovascular;  Laterality: N/A;  . CARDIOVERSION N/A 04/11/2016   Procedure: CARDIOVERSION;  Surgeon: Larey Dresser, MD;  Location: [facility];  Service: Cardiovascular;  Laterality: N/A;  . ELECTROPHYSIOLOGIC STUDY N/A 03/31/2016   Procedure: A-Flutter Ablation;  Surgeon: Evans Lance, MD;  Location: [facility] CV LAB;  Service: Cardiovascular;  Laterality: N/A;  . EXCHANGE OF A DIALYSIS CATHETER Left 04/25/2018   Procedure: Attempted Exchange Of A Dialysis Catheter on the Left side;  Surgeon: Serafina Mitchell, MD;  Location: [facility] OR;  Service: Vascular;  Laterality: Left;  . IMPLANTABLE CARDIOVERTER DEFIBRILLATOR (ICD) GENERATOR CHANGE N/A 08/27/2013   Procedure: ICD GENERATOR CHANGE;  Surgeon: Evans Lance, MD;  Location: [facility] CATH LAB;  Service: Cardiovascular;  Laterality: N/A;  . INSERTION OF DIALYSIS CATHETER Right 04/25/2018   Procedure: INSERTION OF TUNNELED DIALYSIS CATHETER;  Surgeon: Serafina Mitchell, MD;  Location: [facility]  OR;  Service: Vascular;  Laterality: Right;  . TEE WITHOUT CARDIOVERSION N/A 03/31/2016   Procedure: TRANSESOPHAGEAL ECHOCARDIOGRAM (TEE);  Surgeon: Larey Dresser, MD;  Location: [facility];  Service: Cardiovascular;  Laterality: N/A;   Social History:  reports  that he quit smoking about 15 years ago. His smoking use included cigars. He quit after 10.00 years of use. He has never used smokeless tobacco. He reports that he does not drink alcohol or use drugs.  Family / Support Systems Marital Status: Single Patient Roles: Parent, Other (Comment)(has an aunt and uncle) Children: Pt reports that he has 3 children (2 are adult and one under 14) - little contact with any of them. Other Supports: aunt, [facility] @ (C) [phone];  uncle, Roxanne Gates @ (C) [phone];  pt's "fiance", Donne Anon @ [phone] (pt reports she "moves between here and [place], [place]") Anticipated Caregiver: Aunt and uncle Ability/Limitations of Caregiver: Aunt and Barbaraann Rondo are able to provide supervision. Caregiver Availability: 24/7 Family Dynamics: Pt notes good support from his family and believes they can provide support if needed.  Social History Preferred language: English Religion: [employer] Cultural Background: NA Education: HS  Read: Yes Write: Yes Employment Status: Unemployed Date Retired/Disabled/Unemployed: since Oct 2019;  pt is to complete Medicaid and SSD app while in the hospital Legal History/Current Legal Issues: none Guardian/Conservator: None - per MD, pt is not fully capable of making decisions on his own behalf - defer to aunt/uncle   Abuse/Neglect Abuse/Neglect Assessment Can Be Completed: Yes Physical Abuse: Denies Verbal Abuse: Denies Sexual Abuse: Denies Exploitation of patient/patient's resources: Denies Self-Neglect: Denies  Emotional Status [name] affect, behavior and adjustment status: Pt lying in bed and admits much fatigue from morning therapies and poor sleep.  He does complete assessment interview without any difficulty.  He denies any significant emotional distress, however, will monitor and refer for neuropsychology as indicated. Recent Psychosocial Issues: Pt was "laid off" in October and had been trying to find "temp"  work since.   Psychiatric History: None Substance Abuse History: none  Patient / Family Perceptions, Expectations & Goals Pt/Family understanding of illness & functional limitations: Pt with good, general understanding of his current debility due to pna, resp failure, renal failure.  Good understanding of the need for CIR to regain independence. Premorbid pt/family roles/activities: Pt was independent overall. Anticipated changes in roles/activities/participation: None significant as tx have set mod ind - supervision goals.  Pt now adjusting to new HD needs. Pt/family expectations/goals: "I just need to get off this oxygen."  [employer]: None Premorbid Home Care/DME Agencies: None Transportation available at discharge: yes - pt states that aunt can transport to HD, however, need to confirm and possible place SCAT application.  Discharge Planning Living Arrangements: Other relatives Support Systems: Other relatives Type of Residence: Private residence Insurance Resources: Teacher, adult education Resources: Family Support Financial Screen Referred: Previously completed Living Expenses: Lives with family Money Management: Patient Does the patient have any problems obtaining your medications?: Yes (Describe)(no insurance.) Home Management: Pt and family share responsibilities. Patient/Family Preliminary Plans: Pt to return home wtih aunt and uncle. Social Work Anticipated Follow Up Needs: HH/OP Expected length of stay: 7-10 days  Clinical Impression Unfortunate gentleman who is now requiring HD and has debility due to pna and resp failure.  Weakened but making good progress toward mod ind/ supervision goals.  Plan to return home with aunt and uncle who can provide supervision.  Will assist with d/c planning and support needs.  Tomer Chalmers 04/29/2018, 3:00 PM

## 2018-04-29 NOTE — Progress Notes (Signed)
[facility]    NEPHROLOGY PROGRESS NOTE  SUBJECTIVE:  No c/o.     OBJECTIVE:  Vitals:   04/28/18 1920 04/29/18 0553  BP: 102/73 127/67  Pulse: 88 77  Resp: 18 18  Temp: 98 F (36.7 C) 98.2 F (36.8 C)  SpO2: 100% 96%    Intake/Output Summary (Last 24 hours) at 04/29/2018 1117 Last data filed at 04/29/2018 4818 Gross per 24 hour  Intake 380 ml  Output -  Net 380 ml      [place]:  NAD HEENT: NCAT a Neck:   RIJ TDC c/d/i CV:  RRR  Lungs:  CTAB Abd:  abd SNT/ND with normal BS Extremities:  No LE edema. LUE BB AVF thrill and bruit Skin:  No skin rash  MEDICATIONS:  . allopurinol  100 mg Oral Daily  . amiodarone  200 mg Oral Daily  . atorvastatin  40 mg Oral q1800  . Chlorhexidine Gluconate Cloth  6 each Topical Q0600  . [START ON 04/30/2018] darbepoetin (ARANESP) injection - DIALYSIS  40 mcg Intravenous Q Tue-HD  . feeding supplement  1 Container Oral TID BM  . feeding supplement (PRO-STAT SUGAR FREE 64)  30 mL Oral BID  . insulin aspart  0-5 Units Subcutaneous QHS  . insulin aspart  0-9 Units Subcutaneous TID WC  . insulin glargine  22 Units Subcutaneous QHS  . nystatin  5 mL Oral QID  . polyethylene glycol  17 g Oral Daily  . senna-docusate  1 tablet Oral BID  . sevelamer carbonate  800 mg Oral TID WC  . warfarin  5 mg Oral ONCE-1800  . Warfarin - Pharmacist Dosing Inpatient   Does not apply q1800       LABS:   CBC Latest Ref Rng & Units 04/29/2018 04/28/2018 04/27/2018  WBC 4.0 - 10.5 K/uL 4.0 4.2 6.0  Hemoglobin 13.0 - 17.0 g/dL 8.6(L) 8.5(L) 9.3(L)  Hematocrit 39.0 - 52.0 % 28.6(L) 29.0(L) 30.0(L)  Platelets 150 - 400 K/uL 269 251 260    CMP Latest Ref Rng & Units 04/29/2018 04/27/2018 04/26/2018  Glucose 70 - 99 mg/dL 110(H) 155(H) 117(H)  BUN 6 - 20 mg/dL 36(H) 47(H) 26(H)  Creatinine 0.61 - 1.24 mg/dL 9.78(H) 10.52(H) 7.58(H)  Sodium 135 - 145 mmol/L 133(L) 128(L) 135  Potassium 3.5 - 5.1 mmol/L 3.6 4.0 3.9  Chloride 98 - 111 mmol/L  96(L) 93(L) 100  CO2 22 - 32 mmol/L 26 22 25   Calcium 8.9 - 10.3 mg/dL 8.2(L) 8.7(L) 8.6(L)  Total Protein 6.5 - 8.1 g/dL - - -  Total Bilirubin 0.3 - 1.2 mg/dL - - -  Alkaline Phos 38 - 126 U/L - - -  AST 15 - 41 U/L - - -  ALT 0 - 44 U/L - - -    Lab Results  Component Value Date   CALCIUM 8.2 (L) 04/29/2018   CAION 1.19 04/15/2018   PHOS 3.8 04/29/2018       Component Value Date/Time   COLORURINE YELLOW 03/28/2018 1040   APPEARANCEUR CLEAR 03/28/2018 1040   LABSPEC 1.015 03/28/2018 1040   PHURINE 7.0 03/28/2018 1040   GLUCOSEU 50 (A) 03/28/2018 1040   HGBUR NEGATIVE 03/28/2018 1040   BILIRUBINUR NEGATIVE 03/28/2018 1040   KETONESUR NEGATIVE 03/28/2018 1040   PROTEINUR 100 (A) 03/28/2018 1040   UROBILINOGEN 0.2 12/18/2006 0428   NITRITE NEGATIVE 03/28/2018 1040   LEUKOCYTESUR NEGATIVE 03/28/2018 1040      Component Value Date/Time   PHART 7.330 (L) 04/15/2018  0435   PCO2ART 47.7 04/15/2018 0435   PO2ART 101.0 04/15/2018 0435   HCO3 25.1 04/15/2018 0435   TCO2 27 04/15/2018 0435   ACIDBASEDEF 1.0 04/15/2018 0435   O2SAT 97.0 04/15/2018 0435       Component Value Date/Time   IRON 23 (L) 04/27/2018 1316   TIBC 197 (L) 04/27/2018 1316   FERRITIN 724 (H) 04/27/2018 1316   IRONPCTSAT 12 (L) 04/27/2018 1316       ASSESSMENT/PLAN:     1.CKD now ESRD: (baseline creatinine 2.5-2.8)cardiorenal versus ATN,  progressed to ESRD at this point. S/p permacath and fistula 2/13. HD yesterday, TTS schedule (also outpt schedule); had spot to [facility]18 [facility] but now in CIR.   2.Anemia:Hb  Stable in 8s.  ESA, to rec Fe  3.  Hyponatremia: mild CTM, cont HD  4. History of atrial fibrillation/atrial flutter status post DCCV and ablation.He is currently rate controlled  5. Acute exacerbation of CHF with cardiogenic shock:per primary, appears resolved.  Maintain euvolemia with UF.   6.  Hyperphosphatemia.  We will continue phosphate binder. controlled  7.  DM.   Per primary  8. Dispo: [facility] TTS 2/18 start 12:25 (on hold for CIR).   [place] for discharge from nephrology perspective.    Travis Bryan

## 2018-04-29 NOTE — Progress Notes (Signed)
[facility] [facility] PHYSICAL MEDICINE & REHABILITATION PROGRESS NOTE   Subjective/Complaints: No new issues this morning. States he slept well. May have had some mild nausea last night after dinner. No pain  ROS: Patient denies fever, rash, sore throat, blurred vision,   vomiting, diarrhea, cough, shortness of breath or chest pain, joint or back pain, headache, or mood change.   Objective:   Ct Abdomen Pelvis Wo Contrast  Result Date: 04/27/2018 CLINICAL DATA:  Abdominal pain. Assess for colitis. April 11, 2018 EXAM: CT ABDOMEN AND PELVIS WITHOUT CONTRAST TECHNIQUE: Multidetector CT imaging of the abdomen and pelvis was performed following the standard protocol without IV contrast. COMPARISON:  April 11, 2018, January 05, 2005 FINDINGS: Lower chest: The heart size is enlarged. Minimal pericardial effusion is identified. Mild patchy and linear opacities are identified in bilateral lung bases, favor atelectasis. The previously noted consolidation of posterior right lung base is improved. Hepatobiliary: Nodular contour of the liver is identified. No focal liver lesion is noted. Gallstones are noted in the gallbladder. The biliary tree is normal. Pancreas: Unremarkable. Spleen: Unremarkable. Adrenals/Urinary Tract: 1.8 cm lobulated nodule is identified in the left adrenal gland enlarged compared to prior CT of 2006. The right adrenal gland is normal. The kidneys are normal. There is no hydronephrosis bilaterally. The bladder is normal. Stomach/Bowel: Stomach is within normal limits. Appendix appears normal. No evidence of bowel wall thickening, distention, or inflammatory changes. Vascular/Lymphatic: Aortic atherosclerosis. No enlarged abdominal or pelvic lymph nodes. Reproductive: Prostate is unremarkable. Other: Mild prominent lymph nodes are identified in bilateral inguinal region unchanged compared prior exam. Musculoskeletal: No acute or significant osseous findings. IMPRESSION: There is no evidence of  colitis. No bowel obstruction is identified. Findings suggesting cirrhosis of liver unchanged.  Cholelithiasis. 1.8 cm lobulated nodule is identified in the left adrenal gland enlarged compared to prior CT of 2006. Consider further evaluation with MRI on outpatient basis. Electronically Signed   By: Abelardo Diesel M.D.   On: 04/27/2018 20:17   Recent Labs    04/28/18 0645 04/29/18 0724  WBC 4.2 4.0  HGB 8.5* 8.6*  HCT 29.0* 28.6*  PLT 251 269   Recent Labs    04/27/18 1316 04/29/18 0724  NA 128* 133*  K 4.0 3.6  CL 93* 96*  CO2 22 26  GLUCOSE 155* 110*  BUN 47* 36*  CREATININE 10.52* 9.78*  CALCIUM 8.7* 8.2*    Intake/Output Summary (Last 24 hours) at 04/29/2018 0854 Last data filed at 04/29/2018 5573 Gross per 24 hour  Intake 380 ml  Output -  Net 380 ml     Physical Exam: Vital Signs Blood pressure 127/67, pulse 77, temperature 98.2 F (36.8 C), resp. rate 18, height 5\' 10"  (1.778 m), weight 101.6 kg, SpO2 96 %. Constitutional: No distress . Vital signs reviewed. HEENT: EOMI, oral membranes moist, dysconjugate gaze Neck: supple Cardiovascular: RRR without murmur. No JVD    Respiratory: CTA Bilaterally without wheezes or rales. Normal effort    GI: BS +, non-tender, non-distended  Musculoskeletal:        General: no trunk tenderness.  Neurological: He is alert and oriented to person, place, and time.    Remains slow to process, speech clearer, better insight Good sitting balance  Motor: Grossly 4 to 4+/5 prox to distal bilaterally.  Skin: Skin is warm and dry.  Psychiatric: flat but cooperative     Assessment/Plan: 1. Functional deficits secondary to debility/encephalopathy which require 3+ hours per day of interdisciplinary therapy in a comprehensive  inpatient rehab setting.  Physiatrist is providing close team supervision and 24 hour management of active medical problems listed below.  Physiatrist and rehab team continue to assess barriers to  discharge/monitor patient progress toward functional and medical goals  Care Tool:  Bathing    Body parts bathed by patient: Right arm, Left arm, Chest, Abdomen, Right upper leg, Left upper leg, Face   Body parts bathed by helper: (did not complete LB due to arrival of x-ray)     Bathing assist Assist Level: Contact Guard/Touching assist     Upper Body Dressing/Undressing Upper body dressing   What is the patient wearing?: Hospital gown only    Upper body assist Assist Level: Supervision/Verbal cueing    Lower Body Dressing/Undressing Lower body dressing      What is the patient wearing?: Underwear/pull up     Lower body assist Assist for lower body dressing: Supervision/Verbal cueing     Toileting Toileting Toileting Activity did not occur (Clothing management and hygiene only): N/A (no void or bm)  Toileting assist Assist for toileting: Minimal Assistance - Patient > 75%     Transfers Chair/bed transfer  Transfers assist     Chair/bed transfer assist level: Minimal Assistance - Patient > 75%     Locomotion Ambulation   Ambulation assist      Assist level: Minimal Assistance - Patient > 75%   Max distance: 13ft    Walk 10 feet activity   Assist     Assist level: Minimal Assistance - Patient > 75%     Walk 50 feet activity   Assist    Assist level: Minimal Assistance - Patient > 75%      Walk 150 feet activity   Assist    Assist level: Minimal Assistance - Patient > 75%      Walk 10 feet on uneven surface  activity   Assist     Assist level: Minimal Assistance - Patient > 75%     Wheelchair     Assist Will patient use wheelchair at discharge?: No Type of Wheelchair: Manual    Wheelchair assist level: Supervision/Verbal cueing Max wheelchair distance: 140ft    Wheelchair 50 feet with 2 turns activity    Assist        Assist Level: Supervision/Verbal cueing   Wheelchair 150 feet activity      Assist     Assist Level: Supervision/Verbal cueing    Medical Problem List and Plan: 1.  Deficits with mobility, endurance, and self-care secondary to debility/encephalopathy.             --Continue CIR therapies including PT, OT, SLP  2.  A fib/DVT Prophylaxis/Anticoagulation: Pharmaceutical: Coumadin with Heparin bridge 3. Pain Management: Hydrocodone prn for LUE pain.  4. Mood: LCSW to follow for evaluation and support.  5. Neuropsych: This patient is not fully capable of making decisions on his own behalf. 6. Skin/Wound Care: Routine pressure relief measures.  7. Fluids/Electrolytes/Nutrition: Strict I/O. Check weights daily.  Appreciate dietitians assistance.  8. AKI on CKD: Likely ESRD- continue HD TTS. On phosphate binders and 1200 cc FR. Scheduled HD at the end of the day to help with tolerance of therapy. .  9. H/o Afib/A flutter: Continues to have bouts of PAF. HR controlled on amiodarone. No BB due to soft BP.  10. Anemia of chronic disease: iron panel on 2/15 consistent with this  -Monitored serially with HD,    -hgb 9.2 2/15, down to 8.5 on 2/16---->8.6 2/17  -  Supplementation per nephrology    11. T2DM: Monitor BS ac/hs. Continue Lantus 22 units at bedtime. Added scheduled meal coverage for tighter control.  -reasonable control 2/16 12.  Decompensated heart failure: Managed with HD--hyponatremia resolved.  Decreased FR to 1200cc.    Danley Danker Weights   04/27/18 1900 04/27/18 2257 04/28/18 0357  Weight: 104 kg 102.5 kg 101.6 kg     -wean oxygen as tolerated 13. Low protein malnutrition: ON nephro. Will add prostat also. 58. Metavirus PNA/HCAP: Has completed antibiotic regimen. Encourage pulmonary hygiene. Wean supplemental oxygen as tolerated--has been using prn in the month PTA.    15. RLQ pain, N/V: ?mild, short-lived nausea last night after dinner   -Both KUB and CT of the abdomen are unremarkable.  -He has been moving his bowels  -May be musculoskeletal  pain  -Provide analgesia as needed and continue to observe.  -asked pt to be observant for any dietary intolerances   LOS: 3 days A FACE TO FACE EVALUATION WAS PERFORMED  Meredith Staggers 04/29/2018, 8:54 AM

## 2018-04-29 NOTE — Progress Notes (Signed)
Inpatient Rehabilitation  Patient information reviewed and entered into eRehab system by Vallorie Niccoli M. Myking Sar, M.A., CCC/SLP, PPS Coordinator.  Information including medical coding, functional ability and quality indicators will be reviewed and updated through discharge.    

## 2018-04-29 NOTE — Progress Notes (Signed)
Speech Language Pathology Daily Session Note  Patient Details  Name: Travis Bryan MRN: [id number] Date of Birth: [birth year]  Today's Date: 04/29/2018 SLP Individual Time: 2706-2376 SLP Individual Time Calculation (min): 60 min  Short Term Goals: Week 1: SLP Short Term Goal 1 (Week 1): Pt will utilize speech intelligibility strategies (increase vocal intensity) in conversation mod I to achieve 90% intelligibility.  SLP Short Term Goal 2 (Week 1): Pt will complete semi-complex medication and money management task with supervision A verbal cues for functional problem solving.  SLP Short Term Goal 3 (Week 1): Pt will self-monitor and correct functional errors in functional problem solving tasks with min A verbal cues. SLP Short Term Goal 4 (Week 1): Pt will demonstrate 2 safety protocols associated with new limitations with supervision A verbal cues.   Skilled Therapeutic Interventions: Skilled treatment session focused on cognitive goals. SLP facilitated session by providing Mod A verbal cues for recall of his current medications and their functions. However, patient organized a BID pill box with Mod I. Patient was 100% intelligible at the sentence level throughout session with Mod I but demonstrated a flat affect with limited social engagement with clinician. Patient left in bed with alarm on and all needs within reach. Continue with current plan of care.      Pain No/Denies Pain   Therapy/Group: Individual Therapy  Travis Bryan 04/29/2018, 11:26 AM

## 2018-04-29 NOTE — Progress Notes (Signed)
[place] for Heparin and Coumadin Indication: atrial fibrillation  Patient Measurements: Height: 5\' 10"  (177.8 cm) Weight: 223 lb 15.8 oz (101.6 kg) IBW/kg (Calculated) : 73  Heparin dosing weight: 99 kg  Vital Signs: Temp: 98.2 F (36.8 C) (02/17 0553) BP: 127/67 (02/17 0553) Pulse Rate: 77 (02/17 0553)  Labs: Recent Labs    04/27/18 0715 04/27/18 1316 04/28/18 0645 04/29/18 0724  HGB 9.3*  --  8.5* 8.6*  HCT 30.0*  --  29.0* 28.6*  PLT 260  --  251 269  LABPROT 16.4*  --  17.2* 21.4*  INR 1.34  --  1.43 1.88  HEPARINUNFRC 0.41  --  0.61 0.69  CREATININE  --  10.52*  --  9.78*    Assessment: 48 year old male presenting on 03/28/18 with NV and fever, h/o afib on Coumadin PTA.  Coumadin held for procedures, Vitamin K given 2/4.  Pharmacy was consulted for heparin while off Coumadin. Coumadin resumed on 2/14 s/p TDC placement on 04/25/18.  Heparin level this morning remains therapeutic however trended to the higher end of the range (HL 0.69 << 0.61, goal of 0.3-0.7). INR today remains SUBtherapeutic however trending up (INR 1.88 << 1.43, goal of 2-3). Noted improved po intake. Hgb 8.6, plts wnl  PTA Coumadin dose: 7.5 mg MWF, 5 mg all other days.  Goal of Therapy:  INR 2-3 Heparin level 0.3-0.7 units/ml Monitor platelets by anticoagulation protocol: Yes   Plan:  - Reduce Heparin drip slightly to 1400 units/hr (14 ml/hr) to keep within range - Warfarin 5 mg x 1 dose at 1800 today - Will continue to monitor for any signs/symptoms of bleeding and will follow up with heparin level and PT/INR in the a.m.   Thank you for allowing pharmacy to be a part of this patient's care.  Alycia Rossetti, PharmD, BCPS Clinical Pharmacist Clinical phone for 04/29/2018: [phone] 04/29/2018 9:55 AM   **Pharmacist phone directory can now be found on [email or web address] ([email or web address]).  Listed under [place].

## 2018-04-29 NOTE — Progress Notes (Signed)
Travis Diones, RN  Rehab Admission Coordinator  Physical Medicine and Rehabilitation  PMR Pre-admission  Signed  Date of Service:  04/26/2018 3:16 PM       Related encounter: ED to Hosp-Admission (Discharged) from 03/28/2018 in [facility]      Signed         Show:Clear all [x] Manual[x] Template[x] Copied  Added by: [x] Travis Diones, RN  [] Hover for details PMR Admission Coordinator Pre-Admission Assessment  Patient: Travis Bryan is an 48 y.o., male MRN: [id number] DOB: 01/13/1971 Height: 5\' 10"  (177.8 cm) Weight: 99.2 kg                                                                                                                                                  Insurance Information Self pay - no insurance  Medicaid Application Date: Pending      Case Manager:   Disability Application Date: Pending      Case Worker:    Emergency Publishing copy Information    Name Relation Home Work Mobile   [place] Relative   [phone]   [place],Beverly Aunt [phone]     Tamsen Roers Significant other   [phone]     Current Medical History  Patient Admitting Diagnosis: debility related to bilateral pneumonia with respiratory failure, PEA cardiac arrest, renal faliure  History of Present Illness: A 48 year old right-handed male with history of NICM/AICD, chronic systolic congestive heart failure, atrial flutter-on Coumadin, CKDIII-IV,HTN,recent hospitalization for pneumonia, who was admitted on 03/28/2018 with low-grade fevers,nausea/vomiting, progressive dyspnea and lethargy. He was started on BiPAP as well as broad spectrum antibiotics and diuretics. Work-up revealed acute on chronic renal failure as well as acute hypoxemic respiratory failure due to Metapneumovirus pneumoniain setting ofacute on chronic CHF.He continued to have worsening of renal status with poor urine output and CRRT  initiated. He developed PEA arrest on 01//20 requiring CPR x14 minutes with ACLS protocol.He was intubated and has required pressors due to cardiogenic shock.  Hospital course significant for ARDS, H CAP versus aspiration pneumonia, coagulopathy requiring reversal of Coumadin with FFP,abdominal distention due to ileusandacute on chronic anemia.He tolerated extubation on 2/4 and CRRTdiscontinued but patient continued to have minimal without urine output and no improvement in renal status. Hemodialysis initiated and nephrology felt that patient had progressed to ESRD and Left AVF placed on 02/13 by Dr. Trula Slade.     Past Medical History      Past Medical History:  Diagnosis Date  . AICD (automatic cardioverter/defibrillator) present   . Atrial flutter ([place])    a. s/p ablation 03/2016  . Chronic systolic CHF (congestive heart failure) ([place])   . HCAP (healthcare-associated pneumonia) 03/28/2018  . History of gout   . Hyperlipidemia   . Hypertension   . Myocardial infarction ([place])    "  I think I had one a long long time ago" (03/28/2016)  . NICM (nonischemic cardiomyopathy) ([place])    a. LHC 6/06: pLAD 20, pLCx 20-30; b. Echo 5/15:  EF 15%, diffuse HK, restrictive physiology, trivial AI, trivial MR, mild LAE, moderate RVE, moderately reduced RVSF, moderate RAE, mild to moderate TR, PASP 43 mmHg  . Obesity   . Persistent atrial fibrillation   . Type II diabetes mellitus ([facility])     Family History  family history includes Diabetes in his mother; Heart disease in his mother; Hypertension in his mother.  Prior Rehab/Hospitalizations:  Has the patient had major surgery during 100 days prior to admission? No  Current Medications   Current Facility-Administered Medications:  .  acetaminophen (TYLENOL) tablet 650 mg, 650 mg, Oral, Q6H PRN, Cherene Altes, MD .  albuterol (PROVENTIL) (2.5 MG/3ML) 0.083% nebulizer solution 2.5 mg, 2.5 mg, Nebulization, Q2H PRN,  Cherene Altes, MD .  allopurinol (ZYLOPRIM) tablet 100 mg, 100 mg, Oral, Daily, Laurence Slate M, PA-C, 100 mg at 04/26/18 0946 .  amiodarone (PACERONE) tablet 200 mg, 200 mg, Oral, Daily, Laurence Slate M, PA-C, 200 mg at 04/26/18 0946 .  atorvastatin (LIPITOR) tablet 40 mg, 40 mg, Oral, q1800, Ulyses Amor, PA-C, 40 mg at 04/25/18 2045 .  bisacodyl (DULCOLAX) suppository 10 mg, 10 mg, Rectal, Daily PRN, Ulyses Amor, PA-C, 10 mg at 04/06/18 0049 .  Chlorhexidine Gluconate Cloth 2 % PADS 6 each, 6 each, Topical, Q0600, Ulyses Amor, PA-C, 6 each at 04/26/18 [id number] .  Chlorhexidine Gluconate Cloth 2 % PADS 6 each, 6 each, Topical, Q0600, Justin Mend, MD .  diphenhydrAMINE (BENADRYL) capsule 25 mg, 25 mg, Oral, Q6H PRN, Cherene Altes, MD .  feeding supplement (NEPRO CARB STEADY) liquid 237 mL, 237 mL, Oral, Q24H, Collins, Emma M, PA-C, Last Rate: 0 mL/hr at 04/24/18 1600, 237 mL at 04/26/18 1310 .  heparin ADULT infusion 100 units/mL (25000 units/242mL sodium chloride 0.45%), 1,450 Units/hr, Intravenous, Continuous, Skeet Simmer, [facility], Last Rate: 14.5 mL/hr at 04/26/18 0935, 1,450 Units/hr at 04/26/18 0935 .  HYDROcodone-acetaminophen (NORCO/VICODIN) 5-325 MG per tablet 1-2 tablet, 1-2 tablet, Oral, Q6H PRN, Cherene Altes, MD .  insulin aspart (novoLOG) injection 0-9 Units, 0-9 Units, Subcutaneous, TID WC, Collins, Emma M, PA-C, 3 Units at 04/26/18 1146 .  insulin glargine (LANTUS) injection 22 Units, 22 Units, Subcutaneous, QHS, McClung, Kimberlee Nearing, MD .  lip balm (CARMEX) ointment, , Topical, PRN, Ulyses Amor, PA-C, 1 application at 96/04/54 1341 .  MEDLINE mouth rinse, 15 mL, Mouth Rinse, BID, Collins, Emma M, PA-C, 15 mL at 04/24/18 1042 .  nystatin (MYCOSTATIN) 100000 UNIT/ML suspension 500,000 Units, 5 mL, Oral, QID, Ulyses Amor, PA-C, 500,000 Units at 04/26/18 1310 .  ondansetron (ZOFRAN) injection 4 mg, 4 mg, Intravenous, Q6H PRN, 4 mg at 04/22/18 1539 **OR**  [DISCONTINUED] ondansetron (ZOFRAN) tablet 4 mg, 4 mg, Oral, Q6H PRN, Isabelle Course, MD .  polyethylene glycol (MIRALAX / GLYCOLAX) packet 17 g, 17 g, Oral, Daily, Laurence Slate M, PA-C, 17 g at 04/26/18 0945 .  RESOURCE THICKENUP CLEAR, , Oral, PRN, Collins, Emma M, PA-C .  senna-docusate (Senokot-S) tablet 1 tablet, 1 tablet, Oral, BID, Ulyses Amor, [place], 1 tablet at 04/26/18 0981 .  sevelamer carbonate (RENVELA) tablet 800 mg, 800 mg, Oral, TID WC, Collins, Emma M, PA-C, 800 mg at 04/26/18 1147 .  sodium chloride flush (NS) 0.9 % injection 10-40 mL, 10-40 mL,  Intracatheter, Q12H, Ulyses Amor, PA-C, 10 mL at 04/25/18 2222 .  sodium chloride flush (NS) 0.9 % injection 10-40 mL, 10-40 mL, Intracatheter, PRN, Laurence Slate M, PA-C, 10 mL at 04/25/18 1010 .  warfarin (COUMADIN) tablet 7.5 mg, 7.5 mg, Oral, ONCE-1800, Skeet Simmer, RPH .  Warfarin - Pharmacist Dosing Inpatient, , Does not apply, q1800, Ulyses Amor, PA-C, Stopped at 04/22/18 1800  Patients Current Diet:     Diet Order                  Diet renal/carb modified with fluid restriction Diet-HS Snack? Nothing; Fluid restriction: 2000 mL Fluid; Room service appropriate? 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  Home Assistive Devices / Equipment Home Assistive Devices/Equipment: None  Prior Device Use: Indicate devices/aids used by the patient prior to current illness, exacerbation or injury? None  Prior Functional Level Prior Function Level of Independence: Independent Comments: Pt reporting he was independent and was looking for a  job.   Self Care: Did the patient need help bathing, dressing, using the toilet or eating?  Independent  Indoor Mobility: Did the patient need assistance with walking from room to room (with or without device)? Independent  Stairs: Did the patient need assistance with internal or external stairs (with or without device)? Independent  Functional Cognition: Did the patient need help planning regular tasks such as shopping or remembering to take medications? Independent  Current Functional Level Cognition  Overall Cognitive Status: Impaired/Different from baseline Current Attention Level: Sustained Orientation Level: Oriented X4 Following Commands: Follows one step commands with increased time, Follows multi-step commands consistently, Follows multi-step commands with increased time Safety/Judgement: Decreased awareness of safety General Comments: good awareness of lines and leads, followed cues well today     Extremity Assessment (includes Sensation/Coordination)  Upper Extremity Assessment: Generalized weakness  Lower Extremity Assessment: Defer to PT evaluation    ADLs  Overall ADL's : Needs assistance/impaired Eating/Feeding: Supervision/ safety, Set up, Sitting Eating/Feeding Details (indicate cue type and reason): mod verbal cues to slow down, chew and swallow before taking next bite of food Grooming: Min guard, Cueing for sequencing, Cueing for safety, Wash/dry hands, Wash/dry face, Standing Grooming Details (indicate cue type and reason): Pt sitting at EOB with Min Guard A to perform oral care. Noting undershooting as pt would reach for swab or cup. Pt denies double vision. Pt requiring cues for sequencing.  Upper Body Bathing: Min guard, Sitting Lower Body Bathing: Minimal assistance, +2 for safety/equipment, Sit to/from stand Upper Body Dressing : Min guard, Sitting Lower Body Dressing: Moderate assistance, Sit to/from stand Lower Body Dressing Details (indicate cue  type and reason): Mod A due to poor coorindation and vision for donning socks Toilet Transfer: Minimal assistance, Stand-pivot, Cueing for sequencing, Cueing for safety Toilet Transfer Details (indicate cue type and reason): simulated with transfer from eob to recliner. cues for hand placement on arm rests Toileting- Clothing Manipulation and Hygiene: Minimal assistance, Sit to/from stand Toileting - Clothing Manipulation Details (indicate cue type and reason): simulated during transfer Functional mobility during ADLs:  Minimal assistance, Cueing for sequencing, Cueing for safety General ADL Comments: notable delay with verbal response and physical initiation of tasks. multiple verbal and tactile stimulus required for task intitiation and completion.      Mobility  Overal bed mobility: Needs Assistance Bed Mobility: Rolling, Sidelying to Sit Rolling: Supervision Sidelying to sit: Supervision Supine to sit: Min guard Sit to supine: Min guard General bed mobility comments: OOB in chair     Transfers  Overall transfer level: Needs assistance Equipment used: Rolling walker (2 wheeled) Transfers: Sit to/from Stand Sit to Stand: Min guard Stand pivot transfers: Min assist General transfer comment: min guard for safety, cues for hand placement     Ambulation / Gait / Stairs / Wheelchair Mobility  Ambulation/Gait Ambulation/Gait assistance: Counsellor (Feet): 100 Feet Assistive device: None Gait Pattern/deviations: Step-through pattern, Drifts right/left, Wide base of support General Gait Details: attempted gait without assistive device today, able to gait train approximately 123ft without assistive device but does demonstrate wide BOS; VSS during gait on 2LPM O2  Gait velocity: decreased    Posture / Balance Balance Overall balance assessment: Mild deficits observed, not formally tested Sitting-balance support: No upper extremity supported, Feet supported Sitting  balance-Leahy Scale: Good Standing balance support: During functional activity, Bilateral upper extremity supported Standing balance-Leahy Scale: Fair Standing balance comment: min guard for safety for gait without device     Special needs/care consideration BiPAP/CPAP No CPM No Continuous Drip IV Heparin drip Dialysis Yes        Days T-TH-SAT Life Vest No Oxygen Yes, on 02 2L [place] here in hospital and at home Special Bed No Trach Size No Wound Vac (area) No       Skin Coccyx wound, left neck dressing, left arm dressing                             Bowel mgmt: Last BM 04/26/18 Bladder mgmt: Anuria Diabetic mgmt Yes, on insulin now in hospital    Previous Home Environment Living Arrangements: Other relatives Type of Home: [place]: No Additional Comments: Patient vauge with responses for home set up and PLOF. Pt reporting he lives with to aunts.  Discharge Living Setting Plans for Discharge Living Setting: House, Lives with (comment)(Lives with aunt and uncle) Type of Home at Discharge: House Discharge Home Layout: One level Discharge Home Access: Stairs to enter Entrance Stairs-Number of Steps: 3-4 steps Discharge Bathroom Shower/Tub: Tub/shower unit, Curtain Discharge Bathroom Toilet: Standard Discharge Bathroom Accessibility: Yes How Accessible: Accessible via walker Does the patient have any problems obtaining your medications?: No  Social/Family/Support Systems Patient Roles: Other (Comment)(Has aunt and uncle) Contact Information: Roxanne Gates - uncle - [phone] Anticipated Caregiver: Aunt and uncle Anticipated Caregiver's Contact Information: Clint Guy - aunt - [phone] Ability/Limitations of Caregiver: Elenor Legato and Barbaraann Rondo are able to provide supervision. Caregiver Availability: 24/7 Discharge Plan Discussed with Primary Caregiver: Yes Is Caregiver In Agreement with Plan?: Yes Does Caregiver/Family have Issues with  Lodging/Transportation while Pt is in Rehab?: No  Goals/Additional Needs Patient/Family Goal for Rehab: PT/OT mod I and supervision, SLP mod I goals Expected length of stay: 11-18 days Cultural Considerations: None Dietary Needs: Renal, carb mod, fluid restriction 2000 mL/day Equipment Needs: TBD Special Service Needs: HD T-TH-Sat, clip process is complete with outpatient HD set up Pt/Family Agrees to Admission and willing to participate: Yes(Spoke with aunt and uncle) Program Orientation Provided & Reviewed with Pt/Caregiver  Including Roles  & Responsibilities: Yes  Decrease burden of Care through IP rehab admission: N/A  Possible need for SNF placement upon discharge: Not anticipated  Patient Condition: This patient's medical and functional status has changed since the consult dated: 04/18/18 in which the Rehabilitation Physician determined and documented that the patient's condition is appropriate for intensive rehabilitative care in an inpatient rehabilitation facility. See "History of Present Illness" (above) for medical update. Functional changes are: Currently requiring min to min guard 100 feet no device and min assist overall with ADLs. Patient's medical and functional status update has been discussed with the Rehabilitation physician and patient remains appropriate for inpatient rehabilitation. Will admit to inpatient rehab today.  Preadmission Screen Completed By:  Travis Bryan, 04/26/2018 3:30 PM ______________________________________________________________________   Discussed status with Dr. Posey Pronto on 04/26/18 at 1530 and received telephone approval for admission today.  Admission Coordinator:  Travis Bryan, time 1530/Date 04/26/18           Cosigned by: Jamse Arn, MD at 04/26/2018 3:40 PM  Revision History

## 2018-04-29 NOTE — IPOC Note (Addendum)
Overall Plan of Care [facility]) Patient Details Name: Orva Gwaltney MRN: [id number] DOB: 06-Feb-1971  Admitting Diagnosis: <principal problem not specified>  Hospital Problems: Active Problems:   Debility     Functional Problem List: Nursing Bowel, Medication Management, Safety, Pain, Motor, Skin Integrity  PT Balance, Behavior, Edema, Endurance, Nutrition, Perception, Pain, Safety, Skin Integrity  OT Balance, Cognition, Endurance, Motor, Pain, Safety, Vision  SLP    TR         Basic ADL's: OT Grooming, Bathing, Dressing, Toileting     Advanced  ADL's: OT Laundry     Transfers: PT Bed Mobility, Bed to Chair, Furniture, Floor, Teacher, early years/pre, Metallurgist: PT Ambulation, Emergency planning/management officer, Stairs     Additional Impairments: OT None  SLP Communication, Social Cognition expression Problem Solving, Awareness, Memory  TR      Anticipated Outcomes Item Anticipated Outcome  Self Feeding    Swallowing      Basic self-care  Mod I  Toileting  Mod I   Bathroom Transfers Mod I  Bowel/Bladder  Min assist  Transfers  Mod I with LRAD   Locomotion  Mod I ambulatory with LRAD   Communication  Mod I  Cognition  Min-Supervision A  Pain  < 4  Safety/Judgment  Min assist   Therapy Plan: PT Intensity: Minimum of 1-2 x/day ,45 to 90 minutes PT Frequency: 5 out of 7 days PT Duration Estimated Length of Stay: 7-10 days  OT Intensity: Minimum of 1-2 x/day, 45 to 90 minutes OT Frequency: 5 out of 7 days OT Duration/Estimated Length of Stay: 7-10 days SLP Intensity: Minumum of 1-2 x/day, 30 to 90 minutes SLP Frequency: 3 to 5 out of 7 days SLP Duration/Estimated Length of Stay: 7-10 days     Team Interventions: Nursing Interventions Patient/Family Education, Bowel Management, Pain Management, Skin Care/Wound Management, Disease Management/Prevention, Medication Management, Discharge Planning  PT interventions Ambulation/gait training, Cognitive  remediation/compensation, Discharge planning, DME/adaptive equipment instruction, Functional mobility training, Pain management, Psychosocial support, Splinting/orthotics, Therapeutic Activities, UE/LE Strength taining/ROM, Visual/perceptual remediation/compensation, UE/LE Coordination activities, Wheelchair propulsion/positioning, Therapeutic Exercise, Neuromuscular re-education, Patient/family education, Skin care/wound management, Stair training, Disease management/prevention, Academic librarian, Training and development officer  OT Interventions Training and development officer, Cognitive remediation/compensation, Academic librarian, Discharge planning, Disease mangement/prevention, Engineer, drilling, Functional mobility training, Pain management, Patient/family education, Psychosocial support, Self Care/advanced ADL retraining, Skin care/wound managment, Therapeutic Activities, Therapeutic Exercise, UE/LE Strength taining/ROM, UE/LE Coordination activities, Visual/perceptual remediation/compensation  SLP Interventions Cognitive remediation/compensation, Cueing hierarchy, Functional tasks, Medication managment  TR Interventions    SW/CM Interventions Discharge Planning, Psychosocial Support, Patient/Family Education   Barriers to Discharge MD  Medical stability  Nursing Hemodialysis new HD TTS  PT Inaccessible home environment, Medical stability, Wound Care, Insurance for SNF coverage, Hemodialysis, Medication compliance, Pending surgery, Behavior    OT Hemodialysis    SLP      SW       Team Discharge Planning: Destination: PT-Home ,OT- Home , SLP-Home Projected Follow-up: PT-Home health PT, OT-  Outpatient OT, SLP-24 hour supervision/assistance, Home Health SLP, Outpatient SLP Projected Equipment Needs: PT- , OT- Tub/shower seat, SLP-None recommended by SLP Equipment Details: PT- , OT-  Patient/family involved in discharge planning: PT- Patient,  OT-Patient,  SLP-Patient  MD ELOS: 7-10 days  Medical Rehab Prognosis:  Excellent Assessment: The patient has been admitted for CIR therapies with the diagnosis of debility after multiple medical isses. The team will be addressing functional mobility, strength, stamina, balance, safety, adaptive techniques and equipment,  self-care, bowel and bladder mgt, patient and caregiver education, NMR, pain control, community reentry, cognition, speech. Goals have been set at mod I with mobility and self-care and min assist with supervision.    Meredith Staggers, MD, FAAPMR      See Team Conference Notes for weekly updates to the plan of care

## 2018-04-29 NOTE — Progress Notes (Signed)
Occupational Therapy Session Note  Patient Details  Name: Travis Bryan MRN: [id number] Date of Birth: [birth year]  Today's Date: 04/29/2018 OT Individual Time: 1300-1425 OT Individual Time Calculation (min): 85 min   Short Term Goals: Week 1:  OT Short Term Goal 1 (Week 1): STG = LTGs due to ELOS  Skilled Therapeutic Interventions/Progress Updates:    Pt greeted semi-reclined in bed asleep, but easy to wake and agreeable to OT treatment session. Bathing/dressing completed sit<>stand at the sink. Worked on standing balance/endurance with pt able to stand for 75% of bathing tasks today for a total of 10 minutes at longest bout. Pt with supervision for balance and no overt LOB pt could not self-correct. Min A to thread LLE into pants, but then supervision for standing balance when pulling pants over hips. Pt took seated rest break, then stood for toothbrushing task. Pt ambulated to day room with CGA and worked on standing balance/endurance with wii bowling activity. Challenged balance when step and follow through with CGA for balance when stepping. Brought pt down to therapy apartment for time management in wc and educated on bathroom DME including tub transfer bench. Discussed home set-up as well. Pt on 1L of O2 throughout session with SpO2 maintained at 93% and above. Pt returned to room and left seated in wc with safety belt on and needs met.   Therapy Documentation Precautions:  Precautions Precautions: Fall Precaution Comments: watch O2 Restrictions Weight Bearing Restrictions: No Pain: Pain Assessment Pain Scale: 0-10 Pain Score: 2  Pain Type: Acute pain Pain Location: Elbow Pain Orientation: Left Pain Descriptors / Indicators: Aching Pain Frequency: Intermittent Pain Onset: Gradual Patients Stated Pain Goal: 3 Pain Intervention(s): Repositioned  Therapy/Group: Individual Therapy  Valma Cava 04/29/2018, 2:26 PM

## 2018-04-30 ENCOUNTER — Inpatient Hospital Stay (HOSPITAL_COMMUNITY): Payer: Self-pay | Admitting: Speech Pathology

## 2018-04-30 ENCOUNTER — Inpatient Hospital Stay (HOSPITAL_COMMUNITY): Payer: Self-pay | Admitting: Physical Therapy

## 2018-04-30 ENCOUNTER — Inpatient Hospital Stay (HOSPITAL_COMMUNITY): Payer: Self-pay | Admitting: Occupational Therapy

## 2018-04-30 ENCOUNTER — Telehealth: Payer: Self-pay | Admitting: Surgery

## 2018-04-30 LAB — CBC
HEMATOCRIT: 27.2 % — AB (ref 39.0–52.0)
HEMOGLOBIN: 8.1 g/dL — AB (ref 13.0–17.0)
MCH: 28 pg (ref 26.0–34.0)
MCHC: 29.8 g/dL — ABNORMAL LOW (ref 30.0–36.0)
MCV: 94.1 fL (ref 80.0–100.0)
Platelets: 260 10*3/uL (ref 150–400)
RBC: 2.89 MIL/uL — ABNORMAL LOW (ref 4.22–5.81)
RDW: 15.4 % (ref 11.5–15.5)
WBC: 4.3 10*3/uL (ref 4.0–10.5)
nRBC: 0 % (ref 0.0–0.2)

## 2018-04-30 LAB — HEPARIN LEVEL (UNFRACTIONATED): Heparin Unfractionated: 0.72 IU/mL — ABNORMAL HIGH (ref 0.30–0.70)

## 2018-04-30 LAB — GLUCOSE, CAPILLARY
GLUCOSE-CAPILLARY: 160 mg/dL — AB (ref 70–99)
GLUCOSE-CAPILLARY: 70 mg/dL (ref 70–99)
Glucose-Capillary: 89 mg/dL (ref 70–99)
Glucose-Capillary: 62 mg/dL — ABNORMAL LOW (ref 70–99)
Glucose-Capillary: 167 mg/dL — ABNORMAL HIGH (ref 70–99)

## 2018-04-30 LAB — PROTIME-INR
INR: 1.93
Prothrombin Time: 21.8 seconds — ABNORMAL HIGH (ref 11.4–15.2)

## 2018-04-30 MED ORDER — WARFARIN SODIUM 5 MG PO TABS
5.0000 mg | ORAL_TABLET | Freq: Once | ORAL | Status: AC
  Administered 2018-04-30: 5 mg via ORAL
  Filled 2018-04-30: qty 1

## 2018-04-30 MED ORDER — HEPARIN SODIUM (PORCINE) 1000 UNIT/ML IJ SOLN
INTRAMUSCULAR | Status: AC
  Filled 2018-04-30: qty 1

## 2018-04-30 MED ORDER — DARBEPOETIN ALFA 40 MCG/0.4ML IJ SOSY
PREFILLED_SYRINGE | INTRAMUSCULAR | Status: AC
  Filled 2018-04-30: qty 0.4

## 2018-04-30 MED ORDER — CHLORHEXIDINE GLUCONATE CLOTH 2 % EX PADS: 6.0000 | MEDICATED_PAD | Freq: Every day | CUTANEOUS | Status: DC

## 2018-04-30 NOTE — Progress Notes (Signed)
Physical Therapy Session Note  Patient Details  Name: Sharmarke Cicio MRN: [id number] Date of Birth: 03/24/1970  Today's Date: 04/30/2018 PT Individual Time: 9201-0071 PT Individual Time Calculation (min): 54 min   Short Term Goals: Week 1:  PT Short Term Goal 1 (Week 1): STG=LTG due to ELOS   Skilled Therapeutic Interventions/Progress Updates:    pt performs gait throughout unit with supervision.  Gait with obstacle negotiation with supervision stepping over, around and up on objects without LOB.  Standing therex 2 x 12 hip abd, march, HS curl, heel raises, mini squats all with 3# wts bilat LEs.  Standing on foam with perturbations with min A, static stance of foam with supervision.  nustep x 8 minutes level 5 for UE/LE strength and endurance. Pt able to perform session on room air with spO2 95%.  Therapy Documentation Precautions:  Precautions Precautions: Fall Precaution Comments: watch O2 Restrictions Weight Bearing Restrictions: No Pain: No c/o pain in session   Therapy/Group: Individual Therapy  Norlene Lanes 04/30/2018, 9:57 AM

## 2018-04-30 NOTE — Progress Notes (Signed)
Hypoglycemic Event  CBG: 62 at 1815   Treatment: 135mL of grape juice given. Pt ate 100% of dinner   Symptoms: none  Follow-up CBG: Time:1841 CBG Result: 70  Possible Reasons for Event: pt returned from dialysis.   Comments/MD notified: none      Travis Bryan W [place]

## 2018-04-30 NOTE — Progress Notes (Signed)
[facility] for Heparin and Coumadin Indication: atrial fibrillation  Patient Measurements: Height: 5\' 10"  (177.8 cm) Weight: 232 lb 5.8 oz (105.4 kg) IBW/kg (Calculated) : 73  Heparin dosing weight: 99 kg  Vital Signs: Temp: 98.7 F (37.1 C) (02/18 0448) Temp Source: Oral (02/18 0448) BP: 105/61 (02/18 0448) Pulse Rate: 76 (02/18 0448)  Labs: Recent Labs    04/27/18 1316  04/28/18 0645 04/29/18 0724 04/30/18 0557  HGB  --    < > 8.5* 8.6* 8.1*  HCT  --   --  29.0* 28.6* 27.2*  PLT  --   --  251 269 260  LABPROT  --   --  17.2* 21.4* 21.8*  INR  --   --  1.43 1.88 1.93  HEPARINUNFRC  --   --  0.61 0.69 0.72*  CREATININE 10.52*  --   --  9.78*  --    < > = values in this interval not displayed.    Assessment: 48 year old male presenting on 03/28/18 with NV and fever, h/o afib on Coumadin PTA.  Coumadin held for procedures, Vitamin K given 2/4.  Pharmacy was consulted for heparin while off Coumadin. Coumadin resumed on 2/14 s/p TDC placement on 04/25/18.  Heparin level this morning is slightly SUPRAtherapeutic at 0.72 (goal of 0.3-0.7). INR today remains SUBtherapeutic however trending up (INR 1.93>>1.88, goal of 2-3). Patient now seems to be eating well with 100 % of meals consumed. Hgb today is down slightly to 8.1 but platelets are stable at 260. No signs of bleeding noted in the chart.   PTA Coumadin dose: 7.5 mg MWF, 5 mg all other days.   Goal of Therapy:  INR 2-3 Heparin level 0.3-0.7 units/ml Monitor platelets by anticoagulation protocol: Yes   Plan:  - Reduce heparin to 1300 units/hr due to supratherapeutic level and upwards trend - Warfarin 5 mg x 1 dose at 1800 today as PTA - Will continue to monitor for any signs/symptoms of bleeding and will follow up with heparin level and PT/INR in the a.m.   Thank you for allowing pharmacy to be a part of this patient's care.  Jimmy Footman, PharmD, BCPS, BCIDP Infectious Diseases  Clinical Pharmacist Clinical phone for 04/30/2018: [phone] 04/30/2018 8:18 AM   **Pharmacist phone directory can now be found on [email or web address] ([email or web address]).  Listed under [facility].

## 2018-04-30 NOTE — Procedures (Signed)
I was present at this dialysis session. I have reviewed the session itself and made appropriate changes.   UF goal 2L.  Qb 400 TDC.  3K bath.  Doing well.  Filed Weights   04/28/18 0357 04/30/18 0448 04/30/18 1330  Weight: 101.6 kg 105.4 kg 105.6 kg    Recent Labs  Lab 04/29/18 0724  NA 133*  K 3.6  CL 96*  CO2 26  GLUCOSE 110*  BUN 36*  CREATININE 9.78*  CALCIUM 8.2*  PHOS 3.8    Recent Labs  Lab 04/28/18 0645 04/29/18 0724 04/30/18 0557  WBC 4.2 4.0 4.3  HGB 8.5* 8.6* 8.1*  HCT 29.0* 28.6* 27.2*  MCV 94.8 94.4 94.1  PLT 251 269 260    Scheduled Meds: . allopurinol  100 mg Oral Daily  . amiodarone  200 mg Oral Daily  . atorvastatin  40 mg Oral q1800  . Chlorhexidine Gluconate Cloth  6 each Topical Q0600  . Chlorhexidine Gluconate Cloth  6 each Topical Q0600  . darbepoetin (ARANESP) injection - DIALYSIS  40 mcg Intravenous Q Tue-HD  . feeding supplement  1 Container Oral TID BM  . feeding supplement (PRO-STAT SUGAR FREE 64)  30 mL Oral BID  . insulin aspart  0-5 Units Subcutaneous QHS  . insulin aspart  0-9 Units Subcutaneous TID WC  . insulin glargine  22 Units Subcutaneous QHS  . nystatin  5 mL Oral QID  . polyethylene glycol  17 g Oral Daily  . senna-docusate  1 tablet Oral BID  . sevelamer carbonate  800 mg Oral TID WC  . warfarin  5 mg Oral ONCE-1800  . Warfarin - Pharmacist Dosing Inpatient   Does not apply q1800   Continuous Infusions: . ferric gluconate (FERRLECIT/NULECIT) IV    . heparin 1,300 Units/hr (04/30/18 1314)   PRN Meds:.acetaminophen, albuterol, alum & mag hydroxide-simeth, bisacodyl, diphenhydrAMINE, diphenhydrAMINE, guaiFENesin-dextromethorphan, lip balm, oxyCODONE, polyethylene glycol, prochlorperazine **OR** prochlorperazine **OR** prochlorperazine, sodium phosphate, traZODone   Pearson Grippe  MD 04/30/2018, 3:46 PM

## 2018-04-30 NOTE — Plan of Care (Signed)
  Problem: Consults Goal: RH GENERAL PATIENT EDUCATION Description See Patient Education module for education specifics. Outcome: Progressing Goal: Skin Care Protocol Initiated - if Braden Score 18 or less Description If consults are not indicated, leave blank or document N/A Outcome: Progressing Goal: Nutrition Consult-if indicated Outcome: Progressing Goal: Diabetes Guidelines if Diabetic/Glucose > 140 Description If diabetic or lab glucose is > 140 mg/dl - Initiate Diabetes/Hyperglycemia Guidelines & Document Interventions  Outcome: Progressing   Problem: RH BOWEL ELIMINATION Goal: RH STG MANAGE BOWEL WITH ASSISTANCE Description STG Manage Bowel with min Assistance.  Outcome: Progressing Goal: RH STG MANAGE BOWEL W/MEDICATION W/ASSISTANCE Description STG Manage Bowel with Medication with min Assistance.  Outcome: Progressing   Problem: RH SKIN INTEGRITY Goal: RH STG SKIN FREE OF INFECTION/BREAKDOWN Description Patients skin will remain free from further infection or breakdown with mod assist.  Outcome: Progressing Goal: RH STG MAINTAIN SKIN INTEGRITY WITH ASSISTANCE Description STG Maintain Skin Integrity With mod Assistance.  Outcome: Progressing Goal: RH STG ABLE TO PERFORM INCISION/WOUND CARE W/ASSISTANCE Description STG Able To Perform Incision/Wound Care With mod Assistance.  Outcome: Progressing   Problem: RH SAFETY Goal: RH STG ADHERE TO SAFETY PRECAUTIONS W/ASSISTANCE/DEVICE Description STG Adhere to Safety Precautions With min Assistance/Device.  Outcome: Progressing   Problem: RH PAIN MANAGEMENT Goal: RH STG PAIN MANAGED AT OR BELOW [name] PAIN GOAL Description < 3  Outcome: Progressing

## 2018-04-30 NOTE — Progress Notes (Signed)
Speech Language Pathology Daily Session Note  Patient Details  Name: Hilliard Borges MRN: [id number] Date of Birth: [birth year]  Today's Date: 04/30/2018 SLP Individual Time: 4287-6811 SLP Individual Time Calculation (min): 55 min  Short Term Goals: Week 1: SLP Short Term Goal 1 (Week 1): Pt will utilize speech intelligibility strategies (increase vocal intensity) in conversation mod I to achieve 90% intelligibility.  SLP Short Term Goal 2 (Week 1): Pt will complete semi-complex medication and money management task with supervision A verbal cues for functional problem solving.  SLP Short Term Goal 3 (Week 1): Pt will self-monitor and correct functional errors in functional problem solving tasks with min A verbal cues. SLP Short Term Goal 4 (Week 1): Pt will demonstrate 2 safety protocols associated with new limitations with supervision A verbal cues.   Skilled Therapeutic Interventions: Skilled treatment session focused on cognitive goals. SLP facilitated session by providing supervision level verbal cues for recall of the functions of his current medications which indicates good carryover from previous therapy session. SLP also facilitated session by providing extra time and supervision level verbal and visual cues for functional problem solving during a basic money management task. Patient asking appropriate questions in regards to weaning of oxygen and current IV. Patient left sitting EOB with alarm on. Continue with current plan of care.      Pain Pain Assessment Pain Scale: 0-10 Pain Score: 7  Faces Pain Scale: Hurts a little bit Pain Type: Acute pain Pain Location: Elbow Pain Orientation: Left Pain Radiating Towards: bac Pain Descriptors / Indicators: Aching Pain Frequency: Constant Pain Onset: On-going Pain Intervention(s): Medication (See eMAR)(oxycodone)  Therapy/Group: Individual Therapy  Alyvia Derk 04/30/2018, 11:10 AM

## 2018-04-30 NOTE — Progress Notes (Signed)
Occupational Therapy Session Note  Patient Details  Name: Travis Bryan MRN: [id number] Date of Birth: 02-23-1971   Today's Date: 04/30/2018 OT Individual Time: 1005-1100 OT Individual Time Calculation (min): 55 min   Short Term Goals: Week 1:  OT Short Term Goal 1 (Week 1): STG = LTGs due to ELOS  Skilled Therapeutic Interventions/Progress Updates:    Pt greeted semi-reclined EOB agreeable to OT treatment session. Pt requests to wash up and change clothes. OT supplied pt with paper scrubs and a gown. Worked on standing balance and endurance with standing bathing tasks. Pt tolerated standing for 10 mins on room air with SpO2 maintained at 93% and above. Educated on safe technique to sit and remove pants instead of trying to step out of them. Pt with new chest port for dialysis. Discussed that pt will be limited to sponge baths at dc until more permanent access achieved. B UE strength/coordination with wc propulsion to and from therapy gym with 2 rest breaks. Pt returned to room and left seated in wc with safety belt on and needs met.   Therapy Documentation Precautions:  Precautions Precautions: Fall Precaution Comments: watch O2 Restrictions Weight Bearing Restrictions: No Pain: Pain Assessment Pain Scale: 0-10 Pain Score: 4 Pain Type: Acute pain Pain Location: Elbow Pain Orientation: Left Pain Radiating Towards: bac Pain Descriptors / Indicators: Aching Pain Frequency: Constant Pain Onset: On-going Pain Intervention(s): Repositioned  Therapy/Group: Individual Therapy  Valma Cava 04/30/2018, 10:32 AM

## 2018-04-30 NOTE — Progress Notes (Signed)
[facility] PHYSICAL MEDICINE & REHABILITATION PROGRESS NOTE   Subjective/Complaints: No new complaints. Finishing up breakfast.   ROS: Limited due to cognitive/behavioral   Objective:   No results found. Recent Labs    04/29/18 0724 04/30/18 0557  WBC 4.0 4.3  HGB 8.6* 8.1*  HCT 28.6* 27.2*  PLT 269 260   Recent Labs    04/27/18 1316 04/29/18 0724  NA 128* 133*  K 4.0 3.6  CL 93* 96*  CO2 22 26  GLUCOSE 155* 110*  BUN 47* 36*  CREATININE 10.52* 9.78*  CALCIUM 8.7* 8.2*    Intake/Output Summary (Last 24 hours) at 04/30/2018 0849 Last data filed at 04/29/2018 1839 Gross per 24 hour  Intake 1249.07 ml  Output -  Net 1249.07 ml     Physical Exam: Vital Signs Blood pressure 105/61, pulse 76, temperature 98.7 F (37.1 C), temperature source Oral, resp. rate 18, height 5\' 10"  (1.778 m), weight 105.4 kg, SpO2 98 %. Constitutional: No distress . Vital signs reviewed. HEENT: dysconjugate gaze, oral membranes moist Neck: supple Cardiovascular: RRR without murmur. No JVD    Respiratory: CTA Bilaterally without wheezes or rales. Normal effort    GI: BS +, non-tender, non-distended  Musculoskeletal:        General: no trunk tenderness.  Neurological: He is alert and oriented to person, place, and time.    Slow processing, fair insight Good sitting balance  Motor: Grossly 4 to 4+/5 prox to distal bilaterally.  Skin: Skin is warm and dry.  Psychiatric: flat but cooperative     Assessment/Plan: 1. Functional deficits secondary to debility/encephalopathy which require 3+ hours per day of interdisciplinary therapy in a comprehensive inpatient rehab setting.  Physiatrist is providing close team supervision and 24 hour management of active medical problems listed below.  Physiatrist and rehab team continue to assess barriers to discharge/monitor patient progress toward functional and medical goals  Care Tool:  Bathing    Body parts bathed by patient: Right arm,  Left arm, Chest, Abdomen, Right upper leg, Left upper leg, Face   Body parts bathed by helper: (did not complete LB due to arrival of x-ray)     Bathing assist Assist Level: Contact Guard/Touching assist     Upper Body Dressing/Undressing Upper body dressing   What is the patient wearing?: Hospital gown only    Upper body assist Assist Level: Supervision/Verbal cueing    Lower Body Dressing/Undressing Lower body dressing      What is the patient wearing?: Underwear/pull up     Lower body assist Assist for lower body dressing: Supervision/Verbal cueing     Toileting Toileting Toileting Activity did not occur (Clothing management and hygiene only): N/A (no void or bm)  Toileting assist Assist for toileting: Minimal Assistance - Patient > 75%     Transfers Chair/bed transfer  Transfers assist     Chair/bed transfer assist level: Minimal Assistance - Patient > 75%     Locomotion Ambulation   Ambulation assist      Assist level: Minimal Assistance - Patient > 75%   Max distance: 120ft    Walk 10 feet activity   Assist     Assist level: Minimal Assistance - Patient > 75%     Walk 50 feet activity   Assist    Assist level: Minimal Assistance - Patient > 75%      Walk 150 feet activity   Assist    Assist level: Minimal Assistance - Patient > 75%  Walk 10 feet on uneven surface  activity   Assist     Assist level: Minimal Assistance - Patient > 75%     Wheelchair     Assist Will patient use wheelchair at discharge?: No Type of Wheelchair: Manual    Wheelchair assist level: Supervision/Verbal cueing Max wheelchair distance: 153ft    Wheelchair 50 feet with 2 turns activity    Assist            Wheelchair 150 feet activity     Assist Wheelchair 150 feet activity did not occur: Safety/medical concerns(Per doc 120 ft max distance )        Medical Problem List and Plan: 1.  Deficits with mobility,  endurance, and self-care secondary to debility/encephalopathy.             --Continue CIR therapies including PT, OT, SLP  2.  A fib/DVT Prophylaxis/Anticoagulation: Pharmaceutical: Coumadin with Heparin bridge 3. Pain Management: Hydrocodone prn for LUE pain.  4. Mood: LCSW to follow for evaluation and support.  5. Neuropsych: This patient is not fully capable of making decisions on his own behalf. 6. Skin/Wound Care: Routine pressure relief measures.  7. Fluids/Electrolytes/Nutrition: Strict I/O. Check weights daily.  Appreciate dietitians assistance.  8. AKI on CKD: Likely ESRD- continue HD TTS. On phosphate binders and 1200 cc FR.  Scheduled HD at the end of the day to help with tolerance of therapy. .  9. H/o Afib/A flutter: Continues to have bouts of PAF. HR controlled on amiodarone. No BB due to soft BP.  10. Anemia of chronic disease: iron panel on 2/15 consistent with this  -Monitored serially with HD,    -hgb 9.2 2/15, down to 8.5 on 2/16---->8.6 2/17--->8.1 2/18  -tranfusions/supps per nephrology    11. T2DM: Monitor BS ac/hs. Continue Lantus 22 units at bedtime. Added scheduled meal coverage for tighter control.  -reasonable control 2/16 12.  Decompensated heart failure: Managed with HD--hyponatremia resolved.  Decreased FR to 1200cc.    -volume mgt with HD Filed Weights   04/27/18 2257 04/28/18 0357 04/30/18 0448  Weight: 102.5 kg 101.6 kg 105.4 kg     -wean oxygen as tolerated 13. Low protein malnutrition: ON nephro. Will add prostat also. 36. Metavirus PNA/HCAP: Has completed antibiotic regimen. Encourage pulmonary hygiene. Wean supplemental oxygen as tolerated--has been using prn in the month PTA.    15. RLQ pain, N/V: ?mild, short-lived nausea last night after dinner   -Both KUB and CT of the abdomen are unremarkable.  -moving bowels  -likely musculoskeletal, ?behavioral component  LOS: 4 days A FACE TO FACE EVALUATION WAS PERFORMED  Meredith Staggers 04/30/2018,  8:49 AM

## 2018-04-30 NOTE — Telephone Encounter (Signed)
-----   Message from Mena Goes, RN sent at 04/26/2018  8:44 AM EST ----- Regarding: 4-6 weeks with lab  ----- Message ----- From: Serafina Mitchell, MD Sent: 04/25/2018   7:32 PM EST To: [facility] Charge Pool  04/25/2018:   Surgeon:  Annamarie Major Assistants: Laurence Slate Procedure:   1: Removal of left internal jugular vein temporary dialysis catheter   #2: For stage left basilic vein fistula    Follow-up 4 to 6 weeks with duplex of fistula to see any provider

## 2018-05-01 ENCOUNTER — Inpatient Hospital Stay (HOSPITAL_COMMUNITY): Payer: Self-pay

## 2018-05-01 ENCOUNTER — Inpatient Hospital Stay (HOSPITAL_COMMUNITY): Payer: Self-pay | Admitting: Speech Pathology

## 2018-05-01 ENCOUNTER — Inpatient Hospital Stay (HOSPITAL_COMMUNITY): Payer: Self-pay | Admitting: Occupational Therapy

## 2018-05-01 DIAGNOSIS — N184 Chronic kidney disease, stage 4 (severe): Secondary | ICD-10-CM

## 2018-05-01 DIAGNOSIS — N179 Acute kidney failure, unspecified: Secondary | ICD-10-CM

## 2018-05-01 LAB — CBC
HCT: 27.5 % — ABNORMAL LOW (ref 39.0–52.0)
Hemoglobin: 8.4 g/dL — ABNORMAL LOW (ref 13.0–17.0)
MCH: 28.7 pg (ref 26.0–34.0)
MCHC: 30.5 g/dL (ref 30.0–36.0)
MCV: 93.9 fL (ref 80.0–100.0)
Platelets: 270 10*3/uL (ref 150–400)
RBC: 2.93 MIL/uL — ABNORMAL LOW (ref 4.22–5.81)
RDW: 15.6 % — ABNORMAL HIGH (ref 11.5–15.5)
WBC: 4.4 10*3/uL (ref 4.0–10.5)
nRBC: 0 % (ref 0.0–0.2)

## 2018-05-01 LAB — PROTIME-INR
INR: 1.85
Prothrombin Time: 21.1 seconds — ABNORMAL HIGH (ref 11.4–15.2)

## 2018-05-01 LAB — GLUCOSE, CAPILLARY
GLUCOSE-CAPILLARY: 111 mg/dL — AB (ref 70–99)
GLUCOSE-CAPILLARY: 110 mg/dL — AB (ref 70–99)
Glucose-Capillary: 86 mg/dL (ref 70–99)
Glucose-Capillary: 125 mg/dL — ABNORMAL HIGH (ref 70–99)

## 2018-05-01 LAB — HEPARIN LEVEL (UNFRACTIONATED): Heparin Unfractionated: 0.5 IU/mL (ref 0.30–0.70)

## 2018-05-01 MED ORDER — CHLORHEXIDINE GLUCONATE CLOTH 2 % EX PADS
6.0000 | MEDICATED_PAD | Freq: Every day | CUTANEOUS | Status: DC
  Administered 2018-05-02: 6 via TOPICAL

## 2018-05-01 MED ORDER — WARFARIN SODIUM 7.5 MG PO TABS
7.5000 mg | ORAL_TABLET | Freq: Once | ORAL | Status: AC
  Administered 2018-05-01: 7.5 mg via ORAL
  Filled 2018-05-01: qty 1

## 2018-05-01 MED ORDER — BOOST / RESOURCE BREEZE PO LIQD CUSTOM
1.0000 | ORAL | Status: DC
  Administered 2018-05-03: 1 via ORAL

## 2018-05-01 MED ORDER — ACETAMINOPHEN 325 MG PO TABS: 325.0000 mg | ORAL_TABLET | ORAL | Status: DC | PRN

## 2018-05-01 MED ORDER — RENA-VITE PO TABS
1.0000 | ORAL_TABLET | Freq: Every day | ORAL | Status: DC
  Administered 2018-05-01 – 2018-05-03 (×3): 1 via ORAL
  Filled 2018-05-01 (×3): qty 1

## 2018-05-01 NOTE — Progress Notes (Signed)
Speech Language Pathology Discharge Summary  Patient Details  Name: Bernd Crom MRN: [id number] Date of Birth: Aug 03, 1970  Today's Date: 05/01/2018 SLP Individual Time: 0730-0810 SLP Individual Time Calculation (min): 40 min   Skilled Therapeutic Interventions:   Skilled treatment session focused on cognitive goals. SLP facilitated session by administering the MoCA-Basic. Patient scored 28 out of 30 points with a score of 26 or above considered normal. Suspect he is at his cognitive baseline and patient agrees, therefore, he will be discharged from skilled SLP intervention. SLP completed education and left handouts for reinforcement. Patient left upright in bed with alarm on. Continue with current plan of care.   Patient has met 3 of 3 long term goals.  Patient to discharge at overall Modified Independent;Supervision level.   Reasons goals not met: N/A   Clinical Impression/Discharge Summary: Patient has made excellent gains and has met 3 of 3 LTGs this admission. Currently, patient is overall Mod I for complex problem solving and awareness for functional and familiar tasks. Patient is also 100% intelligible at the conversation level with Mod I. Patient is at his cognitive baseline and all education complete. Patient will discharge from skilled SLP intervention and f/u is not warranted at this time.   Recommendation:  None      Equipment: N/A   Reasons for discharge: Treatment goals met;Discharged from hospital   Patient/Family Agrees with Progress Made and Goals Achieved: Yes    [place], [facility] 05/01/2018, 2:12 PM

## 2018-05-01 NOTE — Progress Notes (Signed)
Nutrition Follow-up  DOCUMENTATION CODES:   Not applicable  INTERVENTION:    Decrease Boost Breeze po to once daily, each supplement provides 250 kcal and 9 grams of protein  30 ml Prostat BID, each supplement provides 100 kcals and 15 grams protein.   Add Renal MVI daily  Pt continues to need new HD diet education.   NUTRITION DIAGNOSIS:   Increased nutrient needs related to chronic illness(ESRD on HD) as evidenced by estimated needs.  Ongoing  GOAL:   Patient will meet greater than or equal to 90% of their needs  Meeting  MONITOR:   PO intake, Supplement acceptance, Weight trends, Labs, Skin, I & O's  REASON FOR ASSESSMENT:   Consult Diet education  ASSESSMENT:   48 year old male with history of NICM/AICD, CHF, atrial flutter-on Coumadin, CKD III-IV, HTN, recent hospitalization for PNA. He was admitted on 03/28/2018 with low-grade fevers, nausea/vomiting, progressive dyspnea and lethargy. Work-up revealed acute on chronic renal failure and acute hypoxemic respiratory failure due to Metapneumovirus PNA in setting of acute on CHF. He continued to have worsening of renal status with poor urine output and CRRT initiated. He developed PEA arrest on 01/20 requiring CPR x14 minutes and intubation. He was extubated on 2/4 and CRRT discontinued but patient continued to have minimal urine output without improvement in renal status. HD initiated and nephrology felt that patient had progressed to ESRD;L AVF placed on 2/13.   2/4 - extubated, off CRRT 2/5 - diet advanced to D2/honey 2/10 - diet advanced to regular/thin 2/13- permacath and L AVF 2/18- started HD  Meal completions charted as 75-100% for pt's last eight meals. Pt drinking Boost inconsistently (depending on his mood per RN). RD to decrease amount given fluid restriction.   Weight noted to increase from 102.5 kg on 12/15 to 104.9 kg today. Will continue to monitor.   Attempted to provide pt with new HD diet  education, unable to complete as he was out of the room.   I/O: +1681 ml since admit UOP: none recorded Net UF: 1.9 L on 12/18  Medications reviewed and include: SS novolog, Lantus, miralax, senokot, renvela Labs reviewed: CBG 62-167  Diet Order:   Diet Order            Diet renal/carb modified with fluid restriction Diet-HS Snack? Nothing; Fluid restriction: 1200 mL Fluid; Room service appropriate? Yes; Fluid consistency: Thin  Diet effective now              EDUCATION NEEDS:   Not appropriate for education at this time  Skin:  Skin Assessment: Skin Integrity Issues: Skin Integrity Issues:: Stage II, Other (Comment), Incisions Stage II: penis Incisions: neck (2/13) Other: L AVF (2/13)  Last BM:  2/17  Height:   Ht Readings from Last 1 Encounters:  04/26/18 5\' 10"  (1.778 m)    Weight:   Wt Readings from Last 1 Encounters:  05/01/18 104.9 kg    Ideal Body Weight:  75.45 kg  BMI:  Body mass index is 33.18 kg/m.  Estimated Nutritional Needs:   Kcal:  4081-4481 kcal  Protein:  110-125 grams  Fluid:  1200 ml fluid restriction   Mariana Single RD, LDN Clinical Nutrition Pager # - [phone]

## 2018-05-01 NOTE — Progress Notes (Signed)
Physical Therapy Session Note  Patient Details  Name: Travis Bryan MRN: [id number] Date of Birth: [birth year]  Today's Date: 05/01/2018 PT Individual Time: 1130-1200 PT Individual Time Calculation (min): 30 min   Short Term Goals: Week 1:  PT Short Term Goal 1 (Week 1): STG=LTG due to ELOS   Skilled Therapeutic Interventions/Progress Updates:    Pt seated in w/c upon PT arrival, agreeable to therapy tx and denies pain. Pt ambulated to the dayroom with CGA no AD x 150 ft. Pt used nustep x 5 minutes on workload 5 for endurance and activity tolerance. Pt ambulated to gym x 150 ft with CGA working on endurance, SpO2 95% on room air. Pt ascended/descended 4 steps with rails and CGA. Pt ambulated back to room x 200 ft with CGA and left in w/c with needs in reach and chair alarm set.   Therapy Documentation Precautions:  Precautions Precautions: Fall Precaution Comments: watch O2 Restrictions Weight Bearing Restrictions: No    Therapy/Group: Individual Therapy  Netta Corrigan, PT, DPT 05/01/2018, 8:01 AM

## 2018-05-01 NOTE — Progress Notes (Signed)
Occupational Therapy Session Note  Patient Details  Name: Travis Bryan MRN: [id number] Date of Birth: 05-11-1970  Today's Date: 05/01/2018 OT Individual Time: 0930-1030 OT Individual Time Calculation (min): 60 min    Short Term Goals: Week 1:  OT Short Term Goal 1 (Week 1): STG = LTGs due to ELOS  Skilled Therapeutic Interventions/Progress Updates:    Pt received in bed agreeable to therapy. He discussed that he is going home on Saturday.  Pt feels he will be ready. Pt got up from bed, ambulated to sink, stood at sink for part of his bath, sat to brush teeth, completed bathing and dressing all with S.  Pt was connected to the IV line so he could only use a gown vs a shirt.   Pt worked on standing balance exercises maintaining O2 sats of 97%.  Pt completed balance exercises using his sink for light UE support:  Squats, heel raises, leg abduction, knee/ hip flexion, hamstring curls, side steps.  Pt able to complete 10 reps on each leg. He tolerated exercises well.   Pt resting in w/c with belt alarm on and all needs met.   Therapy Documentation Precautions:  Precautions Precautions: Fall Precaution Comments: watch O2 Restrictions Weight Bearing Restrictions: No    Pain: Pain Assessment Pain Score: 0-No pain ADL: ADL Eating: Independent Where Assessed-Eating: Chair Grooming: Contact guard(in standing) Where Assessed-Grooming: Standing at sink Upper Body Bathing: Minimal cueing, Contact guard Where Assessed-Upper Body Bathing: Standing at sink Upper Body Dressing: Minimal assistance(due to IV) Toilet Transfer: Minimal assistance Toilet Transfer Method: Ambulating   Therapy/Group: Individual Therapy  [place] 05/01/2018, 9:59 AM

## 2018-05-01 NOTE — Discharge Summary (Signed)
Physician Discharge Summary  Patient ID: Travis Bryan MRN: [id number] DOB/AGE: 03/16/1970 48 y.o.  Admit date: 04/26/2018 Discharge date: 05/06/2018  Discharge Diagnoses:  Principal Problem:   Debility Active Problems:   Persistent atrial fibrillation   Type 2 diabetes mellitus with complication, with long-term current use of insulin (HCC)   Anemia of chronic disease   Acute renal failure superimposed on stage 4 chronic kidney disease (HCC)   Discharged Condition:  Stable   Significant Diagnostic Studies: Ct Abdomen Pelvis Wo Contrast  Result Date: 04/27/2018 CLINICAL DATA:  Abdominal pain. Assess for colitis. April 11, 2018 EXAM: CT ABDOMEN AND PELVIS WITHOUT CONTRAST TECHNIQUE: Multidetector CT imaging of the abdomen and pelvis was performed following the standard protocol without IV contrast. COMPARISON:  April 11, 2018, January 05, 2005 FINDINGS: Lower chest: The heart size is enlarged. Minimal pericardial effusion is identified. Mild patchy and linear opacities are identified in bilateral lung bases, favor atelectasis. The previously noted consolidation of posterior right lung base is improved. Hepatobiliary: Nodular contour of the liver is identified. No focal liver lesion is noted. Gallstones are noted in the gallbladder. The biliary tree is normal. Pancreas: Unremarkable. Spleen: Unremarkable. Adrenals/Urinary Tract: 1.8 cm lobulated nodule is identified in the left adrenal gland enlarged compared to prior CT of 2006. The right adrenal gland is normal. The kidneys are normal. There is no hydronephrosis bilaterally. The bladder is normal. Stomach/Bowel: Stomach is within normal limits. Appendix appears normal. No evidence of bowel wall thickening, distention, or inflammatory changes. Vascular/Lymphatic: Aortic atherosclerosis. No enlarged abdominal or pelvic lymph nodes. Reproductive: Prostate is unremarkable. Other: Mild prominent lymph nodes are identified in bilateral inguinal  region unchanged compared prior exam. Musculoskeletal: No acute or significant osseous findings. IMPRESSION: There is no evidence of colitis. No bowel obstruction is identified. Findings suggesting cirrhosis of liver unchanged.  Cholelithiasis. 1.8 cm lobulated nodule is identified in the left adrenal gland enlarged compared to prior CT of 2006. Consider further evaluation with MRI on outpatient basis. Electronically Signed   By: Abelardo Diesel M.D.   On: 04/27/2018 20:17   Labs:  Basic Metabolic Panel: Recent Labs  Lab 04/27/18 1316 04/29/18 0724 05/02/18 1349  NA 128* 133* 129*  K 4.0 3.6 3.5  CL 93* 96* 94*  CO2 '22 26 22  ' GLUCOSE 155* 110* 119*  BUN 47* 36* 45*  CREATININE 10.52* 9.78* 10.00*  CALCIUM 8.7* 8.2* 8.7*  PHOS 4.2 3.8 2.6    CBC: Recent Labs  Lab 04/30/18 0557 05/01/18 0529 05/02/18 1349  WBC 4.3 4.4 4.4  HGB 8.1* 8.4* 8.8*  HCT 27.2* 27.5* 29.7*  MCV 94.1 93.9 94.3  PLT 260 270 288    CBG: Recent Labs  Lab 05/03/18 0636 05/03/18 1111 05/03/18 1652 05/03/18 2128 05/04/18 0613  GLUCAP 108* 141* 143* 151* 105*    Brief HPI:   Travis Bryan is a 48 year old male with history of NICM/AICD, chronic systolic CHF, A flutter-on coumadin, CKD III-IV, recent hospitalization for pneumonia; who was admitted on 03/28/2018 with low-grade fevers, nausea/vomiting, progressive dyspnea and lethargy.  He was placed on BiPAP as well as broad-spectrum antibiotics and diuretics.   Work-up revealed acute on chronic renal failure as well as acute hypoxic respiratory failure due to metapneumovirus pneumonia in setting of acute on chronic CHF.  He continued to have worsening of renal status with poor urine output and CRRT was initiated.  He developed PEA arrest on 01/20 requiring CPR x14 minutes with ACLS protocol.  He was  intubated and required pressors due to cardiogenic shock.  Hospital course was significant for ARDS, HCAP versus aspiration pneumonia, coagulopathy, abdominal  distention due to ileus as well as acute and chronic anemia.  He tolerated extubation on 2/4 and CRRT discontinued.  He continued to have minimal urine output without improvement in renal status therefore hemodialysis initiated.  Nephrology felt that patient had progressed to ESRD and he has been clipped to [facility] PTS/ 12:25 chair.  Left AVF placed on 02/13 by Dr. Trula Slade.  Heparin was resumed with bridge to Coumadin.  He continued to have issues with hypoxia as well as decreased endurance due to debility.  Therapy ongoing and CIR was recommended due to functional decline.    Hospital Course: Travis Bryan was admitted to rehab 04/26/2018 for inpatient therapies to consist of PT, ST and OT at least three hours five days a week. Past admission physiatrist, therapy team and rehab RN have worked together to provide customized collaborative inpatient rehab.  Blood pressures have been monitored on twice daily basis and are relatively controlled.  Heart rate has been monitored on twice daily basis and is controlled on amiodarone daily.  Pharmacy has been assisting Coumadin/heparin management and  INR is 2.4 at discharge and he is to resume his home regiment of 7.5 mg on MWF/ 5 mg TTSS.  He has appointment for protime check on 2/28 at [facility] clinic at 1:45 pm. He did report N/V with abdominal pain on 2/15 therefore CT abdomen/pelvis done which was negative for colitis and showed left adrenal mass enlargement with recommendations for follow up.  His respiratory status has improved and he has been weaned off oxygen.   HD has been ongoing on TTS in afternoon after therapy to help with tolerance of activity. Weight is 105.7 kg at discharge. His po intake has been good and he has been educated on Renal/CM diet with need to maintain fluid restriction.  Left AVF incision is C/D/I. Diabetes has been monitored with  AC/HS CBG checks and blood sugars are relatively controlled.  He has been educated on importance  of maintaining current diet as well as medication compliance to help with further recovery.  He continues to have poor urine output and has multiple question regarding recovery/transplant and have been deferred to nephrology.  Constipation has resolved with scheduled bowel program.  Anemia of chronic disease has been monitored with routine checks and he continues on Aranesp supplementation.  His respiratory status is stable and endurance levels have greatly improved.  He is modified independent at discharge.  He will continue to receive further follow-up home health PT and OT by [place] after discharge.    Rehab course: During patient's stay in rehab team conference was held to monitor patient's progress, set goals and discuss barriers to discharge. At admission, patient required min assist with basic self care tasks and with mobility. He exhibited mild delay in processing and needed mod cues for semi-complex tasks involving money management. He  has had improvement in activity tolerance, balance, postural control as well as ability to compensate for deficits.  He is able to complete ADL tasks at modified independent level. He is modified independent for transfers and to ambulate 200' without AD.  He requires supervision to climb 12 stairs. He has been educated safety and fall prevention at home.    Disposition: Home.   Diet: Renal/Diabetic diet. 1200 cc FR/day  Special Instructions: 1. Will need MRI for follow up on left adrenal mass  enlargement.  2. Monitor blood sugars before meals and at bedtime. Use sliding scale insulin as instructed.  3. No driving.  4. Resume home regimen of Coumadin 27m- 1.5 pills on MWF and one pill on TTSS.    Discharge Instructions    Ambulatory referral to Physical Medicine Rehab   Complete by:  As directed    1-2 weeks transitional care appt. Needs MWF     Allergies as of 05/04/2018      Reactions   No Known Allergies       Medication List     STOP taking these medications   amLODipine 5 MG tablet Commonly known as:  NORVASC   amoxicillin-clavulanate 875-125 MG tablet Commonly known as:  AUGMENTIN   calcitRIOL 0.25 MCG capsule Commonly known as:  ROCALTROL   carvedilol 25 MG tablet Commonly known as:  COREG   colchicine 0.6 MG tablet   diclofenac sodium 1 % Gel Commonly known as:  VOLTAREN   hydrALAZINE 100 MG tablet Commonly known as:  APRESOLINE   isosorbide mononitrate 30 MG 24 hr tablet Commonly known as:  IMDUR   magnesium oxide 400 (241.3 Mg) MG tablet Commonly known as:  MAG-OX   metolazone 2.5 MG tablet Commonly known as:  ZAROXOLYN   potassium chloride SA 20 MEQ tablet Commonly known as:  K-DUR,KLOR-CON   ranolazine 500 MG 12 hr tablet Commonly known as:  RANEXA   torsemide 20 MG tablet Commonly known as:  DEMADEX     TAKE these medications   acetaminophen 325 MG tablet Commonly known as:  TYLENOL Take 1-2 tablets (325-650 mg total) by mouth every 4 (four) hours as needed for mild pain.   allopurinol 100 MG tablet Commonly known as:  ZYLOPRIM Take 1 tablet (100 mg total) by mouth daily. What changed:    medication strength  how much to take   amiodarone 200 MG tablet Commonly known as:  PACERONE Take 1 tablet (200 mg total) by mouth daily.   atorvastatin 40 MG tablet Commonly known as:  LIPITOR Take 1 tablet (40 mg total) by mouth daily at 6 PM. What changed:  when to take this   blood glucose meter kit and supplies Kit Dispense based on patient and insurance preference. Use up to four times daily as directed. (FOR ICD-9 250.00, 250.01). For QAC - HS accuchecks.   insulin aspart 100 UNIT/ML injection Commonly known as:  NOVOLOG Use 3 units three times a day with meals What changed:  additional instructions   insulin glargine 100 UNIT/ML injection Commonly known as:  LANTUS Inject 0.22 mLs (22 Units total) into the skin at bedtime. What changed:    how much to  take  when to take this  additional instructions   Insulin Syringe-Needle U-100 31G X 15/64" 0.3 ML Misc Use to inject insulin 4 times a day What changed:  Another medication with the same name was removed. Continue taking this medication, and follow the directions you see here.   lip balm ointment Apply topically as needed for lip care.   multivitamin Tabs tablet Take 1 tablet by mouth at bedtime.   nystatin 100000 UNIT/ML suspension Commonly known as:  MYCOSTATIN Take 5 mLs (500,000 Units total) by mouth 4 (four) times daily.   polyethylene glycol packet Commonly known as:  MIRALAX / GLYCOLAX Take 17 g by mouth daily. Notes to patient:  For constipation   senna-docusate 8.6-50 MG tablet Commonly known as:  Senokot-S Take 1 tablet by mouth 2 (two)  times daily. Notes to patient:  For constipation   sevelamer carbonate 800 MG tablet Commonly known as:  RENVELA Take 1 tablet (800 mg total) by mouth 3 (three) times daily with meals.   warfarin 5 MG tablet Commonly known as:  COUMADIN Take as directed. If you are unsure how to take this medication, talk to your nurse or doctor. Original instructions:  Take 1 tablet (5 mg total) by mouth daily at 6 PM. Take As Directed by Coumadin Clinic What changed:    how much to take  additional instructions      Follow-up Information    Meredith Staggers, MD Follow up.   Specialty:  Physical Medicine and Rehabilitation Why:  Office will call you with follow up appointment Contact information: [address] [place] [address] [phone]        [name], [place] Follow up on 05/13/2018.   Specialty:  Family Medicine Why:  Appointment at 3:30 pm Contact information: [place] [address] [phone]        Serafina Mitchell, MD Follow up on 06/10/2018.   Specialties:  Vascular Surgery, Cardiology Why:  Appointment at 12 pm Contact information: [place]  [place] [address] [phone]        [place] Office Follow up on 05/10/2018.   Specialty:  Cardiology Why:  Appointment at coumadin clinic- 1:45 pm.  Contact information: [address], [place]          Signed: Bary Leriche 05/06/2018, 4:26 PM

## 2018-05-01 NOTE — Progress Notes (Addendum)
[facility] PHYSICAL MEDICINE & REHABILITATION PROGRESS NOTE   Subjective/Complaints: Patient up at edge of bed talking to fianc.  Patient denies any complaints this morning.  Denies any pain.  Appetite reasonable.  ROS: Patient denies fever, rash, sore throat, blurred vision, nausea, vomiting, diarrhea, cough, shortness of breath or chest pain, joint or back pain, headache, or mood change.   Objective:   No results found. Recent Labs    04/30/18 0557 05/01/18 0529  WBC 4.3 4.4  HGB 8.1* 8.4*  HCT 27.2* 27.5*  PLT 260 270   Recent Labs    04/29/18 0724  NA 133*  K 3.6  CL 96*  CO2 26  GLUCOSE 110*  BUN 36*  CREATININE 9.78*  CALCIUM 8.2*    Intake/Output Summary (Last 24 hours) at 05/01/2018 0857 Last data filed at 05/01/2018 9390 Gross per 24 hour  Intake 1364.73 ml  Output 1860 ml  Net -495.27 ml     Physical Exam: Vital Signs Blood pressure 112/67, pulse 79, temperature 98.9 F (37.2 C), temperature source Oral, resp. rate 16, height 5\' 10"  (1.778 m), weight 104.9 kg, SpO2 96 %. Constitutional: No distress . Vital signs reviewed. HEENT: EOMI, oral membranes moist, disconjugate gaze Neck: supple Cardiovascular: RRR without murmur. No JVD    Respiratory: CTA Bilaterally without wheezes or rales. Normal effort    GI: BS +, non-tender, non-distended  Musculoskeletal:        General: No tenderness in trunk or limbs today. Neurological: He is alert and oriented to person, place, and time.    Slow processing, fair insight Good sitting balance  Motor: Grossly 4 to 4+/5 prox to distal bilaterally.  Skin: Skin is warm and dry.  Multiple healed scars and incisions Psychiatric: Flat but cooperative     Assessment/Plan: 1. Functional deficits secondary to debility/encephalopathy which require 3+ hours per day of interdisciplinary therapy in a comprehensive inpatient rehab setting.  Physiatrist is providing close team supervision and 24 hour management of active  medical problems listed below.  Physiatrist and rehab team continue to assess barriers to discharge/monitor patient progress toward functional and medical goals  Care Tool:  Bathing    Body parts bathed by patient: Right arm, Left arm, Chest, Abdomen, Right upper leg, Left upper leg, Face   Body parts bathed by helper: (did not complete LB due to arrival of x-ray)     Bathing assist Assist Level: Contact Guard/Touching assist     Upper Body Dressing/Undressing Upper body dressing   What is the patient wearing?: Hospital gown only    Upper body assist Assist Level: Supervision/Verbal cueing    Lower Body Dressing/Undressing Lower body dressing      What is the patient wearing?: Underwear/pull up     Lower body assist Assist for lower body dressing: Supervision/Verbal cueing     Toileting Toileting Toileting Activity did not occur (Clothing management and hygiene only): N/A (no void or bm)  Toileting assist Assist for toileting: Minimal Assistance - Patient > 75%     Transfers Chair/bed transfer  Transfers assist     Chair/bed transfer assist level: Supervision/Verbal cueing     Locomotion Ambulation   Ambulation assist      Assist level: Minimal Assistance - Patient > 75%   Max distance: 188ft    Walk 10 feet activity   Assist     Assist level: Supervision/Verbal cueing     Walk 50 feet activity   Assist    Assist level: Supervision/Verbal cueing  Walk 150 feet activity   Assist    Assist level: Supervision/Verbal cueing      Walk 10 feet on uneven surface  activity   Assist     Assist level: Supervision/Verbal cueing     Wheelchair     Assist Will patient use wheelchair at discharge?: No Type of Wheelchair: Manual    Wheelchair assist level: Supervision/Verbal cueing Max wheelchair distance: 143ft    Wheelchair 50 feet with 2 turns activity    Assist            Wheelchair 150 feet activity      Assist Wheelchair 150 feet activity did not occur: Safety/medical concerns(Per doc 120 ft max distance )        Medical Problem List and Plan: 1.  Deficits with mobility, endurance, and self-care secondary to debility/encephalopathy.             --Continue CIR therapies including PT, OT, SLP  -Estimated length of stay is 05/04/2018 after hemodialysis 2.  A fib/DVT Prophylaxis/Anticoagulation: Pharmaceutical: Coumadin with Heparin bridge, INR dipped a little bit today.  Continue cross coverage until INR is therapeutic. 3. Pain Management: Hydrocodone prn for LUE pain.  4. Mood: LCSW to follow for evaluation and support.  5. Neuropsych: This patient is not fully capable of making decisions on his own behalf. 6. Skin/Wound Care: Routine pressure relief measures.  7. Fluids/Electrolytes/Nutrition: Strict I/O. Check weights daily.  Appreciate dietitians assistance.  8. AKI on CKD: Likely ESRD- continue HD TTS. On phosphate binders and 1200 cc FR.  Scheduled HD at the end of the day to help with tolerance of therapy. .  9. H/o Afib/A flutter: Continues to have bouts of PAF. HR controlled on amiodarone. No BB due to soft BP.  10. Anemia of chronic disease: iron panel on 2/15 consistent with this  -Monitored serially     -hgb 9.2 2/15, down to 8.5 on 2/16---->8.6 2/17--->8.1 2/18--> 8.42/19  -tranfusions/supps per nephrology  -Reduce CBCs to dialysis days only   11. T2DM: Monitor BS ac/hs. Continue Lantus 22 units at bedtime. Added scheduled meal coverage for tighter control.  -reasonable control 2/19 12.  Decompensated heart failure: Managed with HD--hyponatremia resolved.  Decreased FR to 1200cc.    -volume mgt with HD Filed Weights   04/30/18 1330 04/30/18 1729 05/01/18 0343  Weight: 105.6 kg 103.8 kg 104.9 kg     -Patient off oxygen now and sating in the 90s 13. Low protein malnutrition: ON nephro. Will add prostat also. 76. Metavirus PNA/HCAP: Has completed antibiotic regimen.  Encourage pulmonary hygiene. Wean supplemental oxygen as tolerated--has been using prn in the month PTA.    15. RLQ pain, N/V: Resolved   -Both KUB and CT of the abdomen are unremarkable.  -moving bowels  -likely musculoskeletal, ?behavioral component  LOS: 5 days A FACE TO FACE EVALUATION WAS PERFORMED  Meredith Staggers 05/01/2018, 8:57 AM

## 2018-05-01 NOTE — Progress Notes (Signed)
[facility] for Heparin and Coumadin Indication: atrial fibrillation  Patient Measurements: Height: 5\' 10"  (177.8 cm) Weight: 231 lb 4.2 oz (104.9 kg) IBW/kg (Calculated) : 73  Heparin dosing weight: 99 kg  Vital Signs: Temp: 98.9 F (37.2 C) (02/19 0343) Temp Source: Oral (02/19 0343) BP: 112/67 (02/19 0343) Pulse Rate: 79 (02/19 0343)  Labs: Recent Labs    04/29/18 0724 04/30/18 0557 05/01/18 0529  HGB 8.6* 8.1* 8.4*  HCT 28.6* 27.2* 27.5*  PLT 269 260 270  LABPROT 21.4* 21.8* 21.1*  INR 1.88 1.93 1.85  HEPARINUNFRC 0.69 0.72* 0.50  CREATININE 9.78*  --   --     Assessment: 48 year old male presenting on 03/28/18 with NV and fever, h/o afib.  Heparin while off PTA Warfarin 5mg  daily exc for 7.5mg  on MWF for hx afib. Admit INR therapeutic. Now restarted post AVG placement on 2/13. Last heparin level is therapeutic at 0.5 while INR down a little to 1.85. Hgb 8.4, plts wnl. No s/s of bleed.  Goal of Therapy:  INR 2-3 Heparin level 0.3-0.7 units/ml Monitor platelets by anticoagulation protocol: Yes   Plan:  Continue heparin gtt at 1,300 units/hr  Give Coumadin 7.5mg  PO x 1 Monitor daily heparin level / INR, CBC, s/s of bleed  Plan to stop heparin when INR > 2  Elenor Quinones, PharmD, [place], [facility] Clinical Pharmacist Phone number [phone] 05/01/2018 7:42 AM

## 2018-05-01 NOTE — Progress Notes (Signed)
Occupational Therapy Session Note  Patient Details  Name: Mc Bloodworth MRN: [id number] Date of Birth: 01/19/1971  Today's Date: 05/01/2018 OT Individual Time: 2197-5883 OT Individual Time Calculation (min): 57 min    Short Term Goals: Week 1:  OT Short Term Goal 1 (Week 1): STG = LTGs due to ELOS  Skilled Therapeutic Interventions/Progress Updates:    1:1. Pt with no c/o pian. Received in bed. Pt completes stand pivot transfers w/c<>EOB<>low couch to simulate home environment with S overall. Pt completes biodex weight shifting, weight bearing, limits of stability, maze and catch activity with min VC for weight shifting without using UE. Pt demo smooth weight shifting and no LOB during activities. No rest breaks required in between activities. Pt completes dynamic standing balance activites reaching laterally and crossing midline with RUE in mod ranges outside BOS and to floor to retrieve bean bags and toss at [employer] with S-CGA overall. Pt instructed in sit to stand without use of UE to power up and able to complete 10 total during cornhole activity with S. Exited session with pt seated in bed, exit alarm on, call light in reach and all needs met  Therapy Documentation Precautions:  Precautions Precautions: Fall Precaution Comments: watch O2 Restrictions Weight Bearing Restrictions: No General:   Vital Signs: Therapy Vitals Temp: 98.6 F (37 C) Temp Source: Oral Pulse Rate: 81 Resp: 18 BP: 123/87 Patient Position (if appropriate): Sitting Oxygen Therapy SpO2: 96 % O2 Device: Room Air Pain:     Therapy/Group: Individual Therapy  Tonny Branch 05/01/2018, 3:14 PM

## 2018-05-01 NOTE — Progress Notes (Signed)
[facility]    NEPHROLOGY PROGRESS NOTE  SUBJECTIVE:  No c/o.  HD yesterday, uneventful.  1.9 L ultrafiltration.   OBJECTIVE:  Vitals:   05/01/18 0343 05/01/18 1318  BP: 112/67 123/87  Pulse: 79 81  Resp: 16 18  Temp: 98.9 F (37.2 C) 98.6 F (37 C)  SpO2: 96% 96%    Intake/Output Summary (Last 24 hours) at 05/01/2018 1449 Last data filed at 05/01/2018 1351 Gross per 24 hour  Intake 1364.73 ml  Output 1860 ml  Net -495.27 ml      Genearl:  NAD HEENT: NCAT a Neck:   RIJ TDC c/d/i CV:  RRR  Lungs:  CTAB Abd:  abd SNT/ND with normal BS Extremities:  No LE edema. LUE BB AVF thrill and bruit Skin:  No skin rash  MEDICATIONS:  . allopurinol  100 mg Oral Daily  . amiodarone  200 mg Oral Daily  . atorvastatin  40 mg Oral q1800  . Chlorhexidine Gluconate Cloth  6 each Topical Q0600  . Chlorhexidine Gluconate Cloth  6 each Topical Q0600  . darbepoetin (ARANESP) injection - DIALYSIS  40 mcg Intravenous Q Tue-HD  . feeding supplement  1 Container Oral TID BM  . feeding supplement (PRO-STAT SUGAR FREE 64)  30 mL Oral BID  . insulin aspart  0-5 Units Subcutaneous QHS  . insulin aspart  0-9 Units Subcutaneous TID WC  . insulin glargine  22 Units Subcutaneous QHS  . nystatin  5 mL Oral QID  . polyethylene glycol  17 g Oral Daily  . senna-docusate  1 tablet Oral BID  . sevelamer carbonate  800 mg Oral TID WC  . warfarin  7.5 mg Oral ONCE-1800  . Warfarin - Pharmacist Dosing Inpatient   Does not apply q1800       LABS:   CBC Latest Ref Rng & Units 05/01/2018 04/30/2018 04/29/2018  WBC 4.0 - 10.5 K/uL 4.4 4.3 4.0  Hemoglobin 13.0 - 17.0 g/dL 8.4(L) 8.1(L) 8.6(L)  Hematocrit 39.0 - 52.0 % 27.5(L) 27.2(L) 28.6(L)  Platelets 150 - 400 K/uL 270 260 269    CMP Latest Ref Rng & Units 04/29/2018 04/27/2018 04/26/2018  Glucose 70 - 99 mg/dL 110(H) 155(H) 117(H)  BUN 6 - 20 mg/dL 36(H) 47(H) 26(H)  Creatinine 0.61 - 1.24 mg/dL 9.78(H) 10.52(H) 7.58(H)  Sodium 135  - 145 mmol/L 133(L) 128(L) 135  Potassium 3.5 - 5.1 mmol/L 3.6 4.0 3.9  Chloride 98 - 111 mmol/L 96(L) 93(L) 100  CO2 22 - 32 mmol/L 26 22 25   Calcium 8.9 - 10.3 mg/dL 8.2(L) 8.7(L) 8.6(L)  Total Protein 6.5 - 8.1 g/dL - - -  Total Bilirubin 0.3 - 1.2 mg/dL - - -  Alkaline Phos 38 - 126 U/L - - -  AST 15 - 41 U/L - - -  ALT 0 - 44 U/L - - -    Lab Results  Component Value Date   CALCIUM 8.2 (L) 04/29/2018   CAION 1.19 04/15/2018   PHOS 3.8 04/29/2018       Component Value Date/Time   COLORURINE YELLOW 03/28/2018 1040   APPEARANCEUR CLEAR 03/28/2018 1040   LABSPEC 1.015 03/28/2018 1040   PHURINE 7.0 03/28/2018 1040   GLUCOSEU 50 (A) 03/28/2018 [place] 03/28/2018 1040   BILIRUBINUR NEGATIVE 03/28/2018 1040   KETONESUR NEGATIVE 03/28/2018 1040   PROTEINUR 100 (A) 03/28/2018 1040   UROBILINOGEN 0.2 12/18/2006 0428   NITRITE NEGATIVE 03/28/2018 1040   LEUKOCYTESUR NEGATIVE 03/28/2018 1040  Component Value Date/Time   PHART 7.330 (L) 04/15/2018 0435   PCO2ART 47.7 04/15/2018 0435   PO2ART 101.0 04/15/2018 0435   HCO3 25.1 04/15/2018 0435   TCO2 27 04/15/2018 0435   ACIDBASEDEF 1.0 04/15/2018 0435   O2SAT 97.0 04/15/2018 0435       Component Value Date/Time   IRON 23 (L) 04/27/2018 1316   TIBC 197 (L) 04/27/2018 1316   FERRITIN 724 (H) 04/27/2018 1316   IRONPCTSAT 12 (L) 04/27/2018 1316       ASSESSMENT/PLAN:     1.CKD now ESRD: (baseline creatinine 2.5-2.8)cardiorenal versus ATN,  progressed to ESRD at this point. S/p permacath and fistula 2/13. HD yesterday, TTS schedule (also outpt schedule); had spot [facility] start 2/18 at [facility] but now in CIR. tentative discharge is 2/22.  If correct, he will start outpatient dialysis on 2/25.  2.Anemia:Hb  Stable in 8s.  ESA, to rec Fe  3.  Hyponatremia: mild CTM, cont HD  4. History of atrial fibrillation/atrial flutter status post DCCV and ablation.He is currently rate controlled.  Warfarin for  anticoagulation.  5. Acute exacerbation of CHF with cardiogenic shock:per primary, resolved.  Maintain euvolemia with UF.   6.  Hyperphosphatemia.  We will continue phosphate binder. controlled  7.  DM.  Per primary  8. Dispo: [place] TTS 2/18 start 12:25 (on hold for CIR).   [place] for discharge from nephrology perspective.    Pearson Grippe

## 2018-05-02 ENCOUNTER — Inpatient Hospital Stay (HOSPITAL_COMMUNITY): Payer: Self-pay | Admitting: Occupational Therapy

## 2018-05-02 ENCOUNTER — Inpatient Hospital Stay (HOSPITAL_COMMUNITY): Payer: Self-pay | Admitting: Physical Therapy

## 2018-05-02 LAB — CBC
HCT: 29.7 % — ABNORMAL LOW (ref 39.0–52.0)
Hemoglobin: 8.8 g/dL — ABNORMAL LOW (ref 13.0–17.0)
MCH: 27.9 pg (ref 26.0–34.0)
MCHC: 29.6 g/dL — ABNORMAL LOW (ref 30.0–36.0)
MCV: 94.3 fL (ref 80.0–100.0)
Platelets: 288 10*3/uL (ref 150–400)
RBC: 3.15 MIL/uL — ABNORMAL LOW (ref 4.22–5.81)
RDW: 15.4 % (ref 11.5–15.5)
WBC: 4.4 10*3/uL (ref 4.0–10.5)
nRBC: 0 % (ref 0.0–0.2)

## 2018-05-02 LAB — GLUCOSE, CAPILLARY
GLUCOSE-CAPILLARY: 187 mg/dL — AB (ref 70–99)
Glucose-Capillary: 77 mg/dL (ref 70–99)
Glucose-Capillary: 115 mg/dL — ABNORMAL HIGH (ref 70–99)
Glucose-Capillary: 111 mg/dL — ABNORMAL HIGH (ref 70–99)

## 2018-05-02 LAB — RENAL FUNCTION PANEL
Albumin: 2.2 g/dL — ABNORMAL LOW (ref 3.5–5.0)
Anion gap: 13 (ref 5–15)
BUN: 45 mg/dL — ABNORMAL HIGH (ref 6–20)
CO2: 22 mmol/L (ref 22–32)
Calcium: 8.7 mg/dL — ABNORMAL LOW (ref 8.9–10.3)
Chloride: 94 mmol/L — ABNORMAL LOW (ref 98–111)
Creatinine, Ser: 10 mg/dL — ABNORMAL HIGH (ref 0.61–1.24)
GFR calc non Af Amer: 6 mL/min — ABNORMAL LOW (ref >60)
GFR, EST AFRICAN AMERICAN: 6 mL/min — AB (ref >60)
Glucose, Bld: 119 mg/dL — ABNORMAL HIGH (ref 70–99)
Phosphorus: 2.6 mg/dL (ref 2.5–4.6)
Potassium: 3.5 mmol/L (ref 3.5–5.1)
Sodium: 129 mmol/L — ABNORMAL LOW (ref 135–145)

## 2018-05-02 LAB — HEPARIN LEVEL (UNFRACTIONATED): Heparin Unfractionated: 0.34 IU/mL (ref 0.30–0.70)

## 2018-05-02 LAB — PROTIME-INR
INR: 2.1
Prothrombin Time: 23.2 seconds — ABNORMAL HIGH (ref 11.4–15.2)

## 2018-05-02 MED ORDER — WARFARIN SODIUM 5 MG PO TABS
5.0000 mg | ORAL_TABLET | Freq: Once | ORAL | Status: AC
  Administered 2018-05-02: 5 mg via ORAL
  Filled 2018-05-02: qty 1

## 2018-05-02 MED ORDER — HEPARIN SODIUM (PORCINE) 1000 UNIT/ML DIALYSIS
20.0000 [IU]/kg | INTRAMUSCULAR | Status: DC | PRN
  Administered 2018-05-02: 2100 [IU] via INTRAVENOUS_CENTRAL
  Filled 2018-05-02: qty 2.1

## 2018-05-02 MED ORDER — HEPARIN SODIUM (PORCINE) 1000 UNIT/ML IJ SOLN
INTRAMUSCULAR | Status: AC
  Filled 2018-05-02: qty 6

## 2018-05-02 MED ORDER — HEPARIN SODIUM (PORCINE) 1000 UNIT/ML IJ SOLN
INTRAMUSCULAR | Status: AC
  Administered 2018-05-02: 1000 [IU]
  Filled 2018-05-02: qty 4

## 2018-05-02 NOTE — Progress Notes (Signed)
[place] for Heparin and Coumadin Indication: atrial fibrillation  Patient Measurements: Height: 5\' 10"  (177.8 cm) Weight: 231 lb 15.2 oz (105.2 kg) IBW/kg (Calculated) : 73  Heparin dosing weight: 99 kg  Vital Signs: Temp: 98.1 F (36.7 C) (02/20 0348) Temp Source: Oral (02/20 0348) BP: 140/80 (02/20 0348) Pulse Rate: 79 (02/20 0348)  Labs: Recent Labs    04/30/18 0557 05/01/18 0529 05/02/18 0839  HGB 8.1* 8.4*  --   HCT 27.2* 27.5*  --   PLT 260 270  --   LABPROT 21.8* 21.1* 23.2*  INR 1.93 1.85 2.10  HEPARINUNFRC 0.72* 0.50 0.34   Assessment: 48 year old male presenting on 03/28/18 with NV and fever, h/o afib.  Heparin while off PTA Warfarin 5mg  daily exc for 7.5mg  on MWF for hx afib. Admit INR therapeutic. Now restarted post AVG placement on 2/13. Last heparin level is therapeutic at 0.34 and INR up to 2.1 this morning. Hgb 8.4, plts wnl. No s/s of bleed.  Goal of Therapy:  INR 2-3 Heparin level 0.3-0.7 units/ml Monitor platelets by anticoagulation protocol: Yes   Plan:  Stop heparin gtt  Give Coumadin 5mg  PO x 1 Monitor daily heparin level / INR, CBC, s/s of bleed  Elenor Quinones, PharmD, BCPS, [facility] Clinical Pharmacist Phone number [phone] 05/02/2018 10:14 AM

## 2018-05-02 NOTE — Progress Notes (Signed)
Physical Therapy Session Note  Patient Details  Name: Holten Spano MRN: [id number] Date of Birth: 06/24/1970  Today's Date: 05/02/2018 PT Individual Time: 0730-0824 PT Individual Time Calculation (min): 54 min   Short Term Goals: Week 1:  PT Short Term Goal 1 (Week 1): STG=LTG due to ELOS   Skilled Therapeutic Interventions/Progress Updates:   pt performs all mobility, transfers and gait this session with supervision without AD.  Pt requires encouragement to participate this session as he states she is fatigued from yesterday.  Pt educated on importance of physical activity and agrees to participate in PT treatment. Pt performs nustep x 8 minutes level 5 for UE/LE strength and endurance.  wii bowling for balance and activity tolerance with pt able to stand x 20 minutes to play bowling game.  Pt performs gait in home and controlled environments including bathroom with supervision. Pt left in bed with needs at hand.   Therapy Documentation Precautions:  Precautions Precautions: Fall Precaution Comments: watch O2 Restrictions Weight Bearing Restrictions: No Pain:  no c/o pain   Therapy/Group: Individual Therapy  Devyne Hauger 05/02/2018, 8:26 AM

## 2018-05-02 NOTE — Progress Notes (Signed)
Occupational Therapy Session Note  Patient Details  Name: Travis Bryan MRN: [id number] Date of Birth: September 02, 1970  Today's Date: 05/02/2018  Session 1 OT Individual Time: 1771-1657 OT Individual Time Calculation (min): 50 min   Session 2 OT Individual Time: 1100-1200 OT Individual Time Calculation (min): 60 min   Short Term Goals: Week 1:  OT Short Term Goal 1 (Week 1): STG = LTGs due to ELOS  Skilled Therapeutic Interventions/Progress Updates:    Pt greeted semi-reclined in bed and agreeable to OT treatment session. Worked on standing balance/endurance with standing bathing at the sink. Min verbal cues for safety to sit down to remove pants. Pt tolerated 15 mins standing with SpO2 maintained above 95%. Pt did not need physical assistance for any BADL tasks, but needed increased time to thread pant legs. Pt ambulated short distance in the hallway with supervision, then requested to return to room 2/2 fatigue. Pt returned to bed and left with bed alarm on and needs met.   Session 2 Pt greeted sitting in wc completing word puzzle and agreeable to OT treatment session. Pt ambulated to therapy apartment mod I with 1 lateral LOB that he was able to self-correct. Pt educated on safety, kitchen modifications, and energy conservation strategies. Pt then able to demonstrate carryover of education during simulated meal prep task including accessing refrigerator, cabinets, and pantry. Pt then ambulated to the [facility], and took seated rest break in waiting area. SpO2 98%. Pt ambulated back to therapy gym in similar fashion. UB there-ex 3 sets of 10 bicep curls, straight arm raises, and chest press using 2 lb dowel rod. Pt ambulated back to room and was given lunch tray to eat prior to transporting to dialysis. Nurse tech present and bed alarm on.     Therapy Documentation Precautions:  Precautions Precautions: Fall Precaution Comments: watch O2 Restrictions Weight Bearing Restrictions:  No Pain: Pain Assessment Pain Scale: 0-10 Pain Score: 0-No pain    Therapy/Group: Individual Therapy  Valma Cava 05/02/2018, 12:43 PM

## 2018-05-02 NOTE — Progress Notes (Signed)
[place] PHYSICAL MEDICINE & REHABILITATION PROGRESS NOTE   Subjective/Complaints: No new complaints. Up in bed. Denies any pain  ROS: Limited due to cognitive/behavioral    Objective:   No results found. Recent Labs    04/30/18 0557 05/01/18 0529  WBC 4.3 4.4  HGB 8.1* 8.4*  HCT 27.2* 27.5*  PLT 260 270   No results for input(s): NA, K, CL, CO2, GLUCOSE, BUN, CREATININE, CALCIUM in the last 72 hours.  Intake/Output Summary (Last 24 hours) at 05/02/2018 0937 Last data filed at 05/02/2018 0745 Gross per 24 hour  Intake 921.87 ml  Output -  Net 921.87 ml     Physical Exam: Vital Signs Blood pressure 140/80, pulse 79, temperature 98.1 F (36.7 C), temperature source Oral, resp. rate 16, height 5\' 10"  (1.778 m), weight 105.2 kg, SpO2 94 %. Constitutional: No distress . Vital signs reviewed. HEENT: EOMI, oral membranes moist Neck: supple Cardiovascular: RRR without murmur. No JVD    Respiratory: CTA Bilaterally without wheezes or rales. Normal effort    GI: BS +, non-tender, non-distended  Musculoskeletal:        General: No tenderness in trunk or limbs today. Neurological: He is alert and oriented to person, place, and time.    Slow processing, fair insight Good sitting balance  Motor: Grossly 4 to 4+/5 prox to distal bilaterally.   Skin: Skin is warm and dry.  Multiple healed scars and incisions without changes Psychiatric: Flat but cooperative     Assessment/Plan: 1. Functional deficits secondary to debility/encephalopathy which require 3+ hours per day of interdisciplinary therapy in a comprehensive inpatient rehab setting.  Physiatrist is providing close team supervision and 24 hour management of active medical problems listed below.  Physiatrist and rehab team continue to assess barriers to discharge/monitor patient progress toward functional and medical goals  Care Tool:  Bathing    Body parts bathed by patient: Right arm, Left arm, Chest, Abdomen,  Right upper leg, Left upper leg, Face, Front perineal area, Buttocks, Right lower leg, Left lower leg   Body parts bathed by helper: (did not complete LB due to arrival of x-ray)     Bathing assist Assist Level: Supervision/Verbal cueing     Upper Body Dressing/Undressing Upper body dressing   What is the patient wearing?: Hospital gown only    Upper body assist Assist Level: Supervision/Verbal cueing    Lower Body Dressing/Undressing Lower body dressing      What is the patient wearing?: Pants     Lower body assist Assist for lower body dressing: Supervision/Verbal cueing     Toileting Toileting Toileting Activity did not occur (Clothing management and hygiene only): N/A (no void or bm)  Toileting assist Assist for toileting: Minimal Assistance - Patient > 75%     Transfers Chair/bed transfer  Transfers assist     Chair/bed transfer assist level: Supervision/Verbal cueing     Locomotion Ambulation   Ambulation assist      Assist level: Supervision/Verbal cueing   Max distance: 200   Walk 10 feet activity   Assist     Assist level: Supervision/Verbal cueing     Walk 50 feet activity   Assist    Assist level: Supervision/Verbal cueing      Walk 150 feet activity   Assist    Assist level: Supervision/Verbal cueing      Walk 10 feet on uneven surface  activity   Assist     Assist level: Supervision/Verbal cueing     Wheelchair  Assist Will patient use wheelchair at discharge?: No Type of Wheelchair: Manual    Wheelchair assist level: Supervision/Verbal cueing Max wheelchair distance: 151ft    Wheelchair 50 feet with 2 turns activity    Assist            Wheelchair 150 feet activity     Assist Wheelchair 150 feet activity did not occur: Safety/medical concerns(Per doc 120 ft max distance )        Medical Problem List and Plan: 1.  Deficits with mobility, endurance, and self-care secondary to  debility/encephalopathy.             --Continue CIR therapies including PT, OT, SLP  -Estimated length of stay is 05/04/2018 after hemodialysis 2.  A fib/DVT Prophylaxis/Anticoagulation: Pharmaceutical: Coumadin with Heparin bridge, INR therapeutic today, cross cover tomorrow then stop heparin. 3. Pain Management: Hydrocodone prn for LUE pain.  4. Mood: LCSW to follow for evaluation and support.  5. Neuropsych: This patient is not fully capable of making decisions on his own behalf. 6. Skin/Wound Care: Routine pressure relief measures.  7. Fluids/Electrolytes/Nutrition: Strict I/O. Check weights daily.  Appreciate dietitians assistance.  8. AKI on CKD: Likely ESRD- continue HD TTS. On phosphate binders and 1200 cc FR.  Scheduled HD at the end of the day to help with tolerance of therapy. .  9. H/o Afib/A flutter: Continues to have bouts of PAF. HR controlled on amiodarone. No BB due to soft BP.  10. Anemia of chronic disease: iron panel on 2/15 consistent with this  -Monitored serially     -hgb 9.2 2/15, down to 8.5 on 2/16---->8.6 2/17--->8.1 2/18--> 8.42/19  -tranfusions/supps per nephrology  -  CBCs  dialysis days only   11. T2DM: Monitor BS ac/hs. Continue Lantus 22 units at bedtime. Added scheduled meal coverage for tighter control.  -reasonable control 2/19 12.  Decompensated heart failure: Managed with HD--hyponatremia resolved.    FR   1200cc.    -volume mgt with HD--for HD today Filed Weights   04/30/18 1729 05/01/18 0343 05/02/18 0348  Weight: 103.8 kg 104.9 kg 105.2 kg     -Patient off oxygen now and sating in the 90s 13. Low protein malnutrition: ON nephro. Will add prostat also. 58. Metavirus PNA/HCAP: Has completed antibiotic regimen. Encourage pulmonary hygiene. Wean supplemental oxygen as tolerated--has been using prn in the month PTA.    15. RLQ pain, N/V: Resolved   -Both KUB and CT of the abdomen are unremarkable.  -moving bowels  -likely musculoskeletal, ?behavioral  component  LOS: 6 days A FACE TO FACE EVALUATION WAS PERFORMED  Meredith Staggers 05/02/2018, 9:37 AM

## 2018-05-02 NOTE — Progress Notes (Signed)
[facility]    NEPHROLOGY PROGRESS NOTE  SUBJECTIVE:  No c/o.  For HD today.  Says discharge scheduled for 2/22, Saturday.   OBJECTIVE:  Vitals:   05/01/18 1946 05/02/18 0348  BP: 123/76 140/80  Pulse: 79 79  Resp: 18 16  Temp: 98.3 F (36.8 C) 98.1 F (36.7 C)  SpO2: 95% 94%    Intake/Output Summary (Last 24 hours) at 05/02/2018 1121 Last data filed at 05/02/2018 1112 Gross per 24 hour  Intake 1140.51 ml  Output -  Net 1140.51 ml      Genearl:  NAD HEENT: NCAT a Neck:   RIJ TDC c/d/i CV:  RRR  Lungs:  CTAB Abd:  abd SNT/ND with normal BS Extremities:  No LE edema. LUE BB AVF thrill and bruit Skin:  No skin rash  MEDICATIONS:  . allopurinol  100 mg Oral Daily  . amiodarone  200 mg Oral Daily  . atorvastatin  40 mg Oral q1800  . Chlorhexidine Gluconate Cloth  6 each Topical Q0600  . Chlorhexidine Gluconate Cloth  6 each Topical Q0600  . darbepoetin (ARANESP) injection - DIALYSIS  40 mcg Intravenous Q Tue-HD  . feeding supplement  1 Container Oral Q24H  . feeding supplement (PRO-STAT SUGAR FREE 64)  30 mL Oral BID  . insulin aspart  0-5 Units Subcutaneous QHS  . insulin aspart  0-9 Units Subcutaneous TID WC  . insulin glargine  22 Units Subcutaneous QHS  . multivitamin  1 tablet Oral QHS  . nystatin  5 mL Oral QID  . polyethylene glycol  17 g Oral Daily  . senna-docusate  1 tablet Oral BID  . sevelamer carbonate  800 mg Oral TID WC  . warfarin  5 mg Oral ONCE-1800  . Warfarin - Pharmacist Dosing Inpatient   Does not apply q1800       LABS:   CBC Latest Ref Rng & Units 05/01/2018 04/30/2018 04/29/2018  WBC 4.0 - 10.5 K/uL 4.4 4.3 4.0  Hemoglobin 13.0 - 17.0 g/dL 8.4(L) 8.1(L) 8.6(L)  Hematocrit 39.0 - 52.0 % 27.5(L) 27.2(L) 28.6(L)  Platelets 150 - 400 K/uL 270 260 269    CMP Latest Ref Rng & Units 04/29/2018 04/27/2018 04/26/2018  Glucose 70 - 99 mg/dL 110(H) 155(H) 117(H)  BUN 6 - 20 mg/dL 36(H) 47(H) 26(H)  Creatinine 0.61 - 1.24 mg/dL  9.78(H) 10.52(H) 7.58(H)  Sodium 135 - 145 mmol/L 133(L) 128(L) 135  Potassium 3.5 - 5.1 mmol/L 3.6 4.0 3.9  Chloride 98 - 111 mmol/L 96(L) 93(L) 100  CO2 22 - 32 mmol/L 26 22 25   Calcium 8.9 - 10.3 mg/dL 8.2(L) 8.7(L) 8.6(L)  Total Protein 6.5 - 8.1 g/dL - - -  Total Bilirubin 0.3 - 1.2 mg/dL - - -  Alkaline Phos 38 - 126 U/L - - -  AST 15 - 41 U/L - - -  ALT 0 - 44 U/L - - -    Lab Results  Component Value Date   CALCIUM 8.2 (L) 04/29/2018   CAION 1.19 04/15/2018   PHOS 3.8 04/29/2018       Component Value Date/Time   COLORURINE YELLOW 03/28/2018 1040   APPEARANCEUR CLEAR 03/28/2018 1040   LABSPEC 1.015 03/28/2018 1040   PHURINE 7.0 03/28/2018 1040   GLUCOSEU 50 (A) 03/28/2018 1040   HGBUR NEGATIVE 03/28/2018 1040   BILIRUBINUR NEGATIVE 03/28/2018 1040   KETONESUR NEGATIVE 03/28/2018 1040   PROTEINUR 100 (A) 03/28/2018 1040   UROBILINOGEN 0.2 12/18/2006 0428   NITRITE  NEGATIVE 03/28/2018 1040   LEUKOCYTESUR NEGATIVE 03/28/2018 1040      Component Value Date/Time   PHART 7.330 (L) 04/15/2018 0435   PCO2ART 47.7 04/15/2018 0435   PO2ART 101.0 04/15/2018 0435   HCO3 25.1 04/15/2018 0435   TCO2 27 04/15/2018 0435   ACIDBASEDEF 1.0 04/15/2018 0435   O2SAT 97.0 04/15/2018 0435       Component Value Date/Time   IRON 23 (L) 04/27/2018 1316   TIBC 197 (L) 04/27/2018 1316   FERRITIN 724 (H) 04/27/2018 1316   IRONPCTSAT 12 (L) 04/27/2018 1316       ASSESSMENT/PLAN:     1.CKD now ESRD: (baseline creatinine 2.5-2.8)cardiorenal versus ATN,  progressed to ESRD at this point. S/p permacath and fistula 2/13. HD today, TTS schedule (also outpt schedule); had spot to start 2/18 at [facility] but now in CIR. tentative discharge is 2/22.  If correct, he will start outpatient dialysis on 2/25.  2.Anemia:Hb  Stable in 8s.  ESA, to rec Fe  3.  Hyponatremia: mild CTM, cont HD  4. History of atrial fibrillation/atrial flutter status post DCCV and ablation.He is currently rate  controlled.  Warfarin for anticoagulation.  5. Acute exacerbation of CHF with cardiogenic shock:per primary, resolved.  Maintain euvolemia with UF.   6.  Hyperphosphatemia.  We will continue phosphate binder. controlled  7.  DM.  Per primary  8. Dispo: [place] TTS 2/18 start 12:25 (on hold for CIR).   [place] for discharge from nephrology perspective.    Pearson Grippe

## 2018-05-02 NOTE — Plan of Care (Signed)
  Problem: Consults Goal: [facility] GENERAL PATIENT EDUCATION Description See Patient Education module for education specifics. 05/02/2018 0525 by Sallye Lat, RN Outcome: Progressing 05/02/2018 0442 by Sallye Lat, RN Outcome: Progressing Goal: Skin Care Protocol Initiated - if Braden Score 18 or less Description If consults are not indicated, leave blank or document N/A 05/02/2018 0525 by Sallye Lat, RN Outcome: Progressing 05/02/2018 0442 by Sallye Lat, RN Outcome: Progressing

## 2018-05-02 NOTE — Plan of Care (Signed)
  Problem: Consults Goal: RH GENERAL PATIENT EDUCATION Description See Patient Education module for education specifics. Outcome: Progressing Goal: Skin Care Protocol Initiated - if Braden Score 18 or less Description If consults are not indicated, leave blank or document N/A Outcome: Progressing

## 2018-05-03 ENCOUNTER — Inpatient Hospital Stay (HOSPITAL_COMMUNITY): Payer: Self-pay | Admitting: Occupational Therapy

## 2018-05-03 ENCOUNTER — Inpatient Hospital Stay (HOSPITAL_COMMUNITY): Payer: Self-pay

## 2018-05-03 LAB — GLUCOSE, CAPILLARY
Glucose-Capillary: 108 mg/dL — ABNORMAL HIGH (ref 70–99)
Glucose-Capillary: 141 mg/dL — ABNORMAL HIGH (ref 70–99)
Glucose-Capillary: 143 mg/dL — ABNORMAL HIGH (ref 70–99)
Glucose-Capillary: 151 mg/dL — ABNORMAL HIGH (ref 70–99)

## 2018-05-03 LAB — PROTIME-INR
INR: 2.21
Prothrombin Time: 24.2 seconds — ABNORMAL HIGH (ref 11.4–15.2)

## 2018-05-03 MED ORDER — RENA-VITE PO TABS: 1.0000 | ORAL_TABLET | Freq: Every day | ORAL | 0 refills | Status: DC

## 2018-05-03 MED ORDER — INSULIN LISPRO 100 UNIT/ML ~~LOC~~ SOLN: 0.0000 [IU] | Freq: Three times a day (TID) | SUBCUTANEOUS | 0 refills | Status: DC

## 2018-05-03 MED ORDER — ALLOPURINOL 100 MG PO TABS: 100.0000 mg | ORAL_TABLET | Freq: Every day | ORAL | 0 refills | Status: DC

## 2018-05-03 MED ORDER — NYSTATIN 100000 UNIT/ML MT SUSP: 5.0000 mL | Freq: Four times a day (QID) | OROMUCOSAL | 0 refills | Status: DC

## 2018-05-03 MED ORDER — POLYETHYLENE GLYCOL 3350 17 G PO PACK: 17.0000 g | PACK | Freq: Every day | ORAL | 0 refills | Status: DC

## 2018-05-03 MED ORDER — ATORVASTATIN CALCIUM 40 MG PO TABS: 40.0000 mg | ORAL_TABLET | Freq: Every day | ORAL | 0 refills | Status: DC

## 2018-05-03 MED ORDER — LIP MEDEX EX OINT: TOPICAL_OINTMENT | CUTANEOUS | 0 refills | Status: DC | PRN

## 2018-05-03 MED ORDER — INSULIN GLARGINE 100 UNIT/ML ~~LOC~~ SOLN: 25.0000 [IU] | Freq: Every day | SUBCUTANEOUS | Status: DC

## 2018-05-03 MED ORDER — INSULIN GLARGINE 100 UNIT/ML ~~LOC~~ SOLN: 22.0000 [IU] | Freq: Every day | SUBCUTANEOUS | 0 refills | Status: DC

## 2018-05-03 MED ORDER — SENNOSIDES-DOCUSATE SODIUM 8.6-50 MG PO TABS: 1.0000 | ORAL_TABLET | Freq: Two times a day (BID) | ORAL | 0 refills | Status: DC

## 2018-05-03 MED ORDER — WARFARIN SODIUM 7.5 MG PO TABS
7.5000 mg | ORAL_TABLET | Freq: Once | ORAL | Status: AC
  Administered 2018-05-03: 7.5 mg via ORAL
  Filled 2018-05-03: qty 1

## 2018-05-03 MED ORDER — "INSULIN SYRINGE-NEEDLE U-100 31G X 15/64"" 0.3 ML MISC": 12 refills | Status: DC

## 2018-05-03 MED ORDER — AMIODARONE HCL 200 MG PO TABS: 200.0000 mg | ORAL_TABLET | Freq: Every day | ORAL | 0 refills | Status: DC

## 2018-05-03 MED ORDER — SEVELAMER CARBONATE 800 MG PO TABS: 800.0000 mg | ORAL_TABLET | Freq: Three times a day (TID) | ORAL | 0 refills | Status: DC

## 2018-05-03 MED ORDER — INSULIN ASPART 100 UNIT/ML ~~LOC~~ SOLN: SUBCUTANEOUS | 3 refills | Status: DC

## 2018-05-03 MED FILL — RENA-VITE TABLET: 30 days supply | Qty: 30 | Fill #0

## 2018-05-03 MED FILL — ATORVASTATIN 40 MG TABLET: 40 | 30 days supply | Qty: 30 | Fill #0

## 2018-05-03 MED FILL — AMIODARONE HCL 200 MG TAB: 200 | 30 days supply | Qty: 30 | Fill #0

## 2018-05-03 MED FILL — NYSTATIN 100,000 UNITS/ML S: 100000 | 3 days supply | Qty: 60 | Fill #0

## 2018-05-03 MED FILL — ALLOPURINOL 100 MG TABS: 100 | 30 days supply | Qty: 30 | Fill #0

## 2018-05-03 MED FILL — SEVELAMER CARBONATE 800 MG: 800 | 30 days supply | Qty: 90 | Fill #0

## 2018-05-03 NOTE — Progress Notes (Signed)
Social Work Patient ID: Travis Bryan, male   DOB: 1970/07/13, 48 y.o.   MRN: [id number]  Have spoken with pt and with his aunt, [facility] (via phone) this morning to review d/c plans.  Both aware that I have requested that pt be placed on "1st run" for HD tomorrow morning and that he should be ready for d/c after lunch.  Aunt notes her boyfriend, Beverely Low, will be the one to pick up pt.    Have placed pt into the [facility] med assist program and he is aware that he can fill these scripts tomorrow at the [facility] prior to 4:00 pm.  Aunt confirms that she/ boyfriend will be providing HD transport.  Pt confirms that his primary medical care is at the [facility] and I will schedule a hospital follow up appt there for him with Dr. Alfonse Spruce.  Rachell Druckenmiller, LCSW

## 2018-05-03 NOTE — Patient Care Conference (Addendum)
Inpatient RehabilitationTeam Conference and Plan of Care Update Date: 04/30/2018   Time:  2:35 PM   Patient Name: Travis Bryan      Medical Record Number: [id number]  Date of Birth: [birth year] Sex: Male         Room/Bed: 4W18C/4W18C-01 Payor Info: Payor: MEDICAID PENDING / Plan: MEDICAID PENDING / Product Type: *No Product type* /    Admitting Diagnosis: Debility  Admit Date/Time:  04/26/2018  4:19 PM Admission Comments: No comment available   Primary Diagnosis:  Debility Principal Problem: Debility  Patient Active Problem List   Diagnosis Date Noted  . Acute renal failure superimposed on stage 4 chronic kidney disease ([place]) 05/01/2018  . Debility 04/26/2018  . Hypoxia   . Post-operative state   . Anemia of chronic disease   . PAF (paroxysmal atrial fibrillation) ([place])   . Pressure injury of skin 04/17/2018  . Bilateral pneumonia 03/29/2018  . Acute respiratory failure with hypoxia ([place]) 03/28/2018  . DKA (diabetic ketoacidosis) ([place]) 02/25/2018  . Acute on chronic systolic heart failure, NYHA class 4 ([place]) 02/25/2018  . Hyperlipidemia 02/25/2018  . Atrial fibrillation ([place]) 02/25/2018  . CAD (coronary artery disease) 02/25/2018  . Elevated troponin 02/25/2018  . Sepsis due to pneumonia ([place]) 02/25/2018  . AKI (acute kidney injury) ([place]) 02/25/2018  . Type 2 diabetes mellitus with complication, with long-term current use of insulin ([place]) 12/26/2017  . At risk for sexually transmitted disease due to unprotected sex 08/23/2017  . Healthcare maintenance 06/24/2016  . Persistent atrial fibrillation   . CARDIOMYOPATHY, SECONDARY 07/03/2008  . HFrEF (heart failure with reduced ejection fraction) ([place]) 07/03/2008  . Automatic implantable cardioverter-defibrillator in situ 07/03/2008  . Type 2 diabetes mellitus ([place]) 12/26/2005  . Hyperlipidemia associated with type 2 diabetes mellitus ([place]) 12/26/2005  . Hypertension associated with diabetes ([place]) 12/26/2005    Expected  Discharge Date: Expected Discharge Date: 05/04/18(after dialysis)  Team Members Present: Physician leading conference: Dr. Alger Simons Social Worker Present: Lennart Pall, LCSW Nurse Present: Mohammed Kindle, LPN PT Present: Roderic Ovens, PT OT Present: Cherylynn Ridges, OT SLP Present: Weston Anna, SLP PPS Coordinator present : Gunnar Fusi     Current Status/Progress Goal Weekly Team Focus  Medical   DEBILITY related to pneumonia and multiple medical, ESRD, CHF, A/C anema, abdominal pain of unknown origin  improve activity tolerance  anemia, volume balancing, HD/ESRD, pain mgt. GI work up   Bowel/Bladder   Continent of bowel and badder  continue to be continent of bowel and bladder  assess bowel and bladder needs qshift and PRN   Swallow/Nutrition/ Hydration             ADL's   Supervision overall  Mod I  modified bathing/dressing, transfers, standing balance/endurance   Mobility   supervision with gait and balance, requires 1-2 L O2 during activity  mod I  family ed, d/c planning, activity tolerance   Communication             Safety/Cognition/ Behavioral Observations  supervision-Mod I   Supervision  complex problem solving, education    Pain   Pt pain well controlled with prn Tylenol  pain less than 2  assess pain management qshift and PRN   Skin   MASD to bottom  promote proper healing, prevent further breakdown of skin, remain free of infection.  assess skin qshift and PRN    Rehab Goals Patient on target to meet rehab goals: Yes *See Care Plan and progress notes for long and short-term  goals.     Barriers to Discharge  Current Status/Progress Possible Resolutions Date Resolved   Physician    Medical stability        see detailed medical progress notes      Nursing                  PT                    OT                  SLP                SW                Discharge Planning/Teaching Needs:  Pt to return home with aunt and uncle who can  provide supervision  TBD   Team Discussion:  ESRD - new HD;  Deconditioned overall.  Having pain at graft site - MD aware.  Cont of bowel.  Managing off O2 today.  Goals for mod ind overall.  Revisions to Treatment Plan:  NA    Continued Need for Acute Rehabilitation Level of Care: The patient requires daily medical management by a physician with specialized training in physical medicine and rehabilitation for the following conditions: Daily direction of a multidisciplinary physical rehabilitation program to ensure safe treatment while eliciting the highest outcome that is of practical value to the patient.: Yes Daily medical management of patient stability for increased activity during participation in an intensive rehabilitation regime.: Yes Daily analysis of laboratory values and/or radiology reports with any subsequent need for medication adjustment of medical intervention for : Pulmonary problems   I attest that I was present, lead the team conference, and concur with the assessment and plan of the team.   Mykalah Saari 05/03/2018, 9:38 AM

## 2018-05-03 NOTE — Progress Notes (Signed)
Occupational Therapy Session Note  Patient Details  Name: Travis Bryan MRN: [id number] Date of Birth: [birth year]  Today's Date: 05/03/2018 OT Individual Time: 1100-1200 OT Individual Time Calculation (min): 60 min  and Today's Date: 05/03/2018 OT Missed Time: 15 Minutes Missed Time Reason: Patient fatigue   Short Term Goals: Week 1:  OT Short Term Goal 1 (Week 1): STG = LTGs due to ELOS  Skilled Therapeutic Interventions/Progress Updates:    At start of session at 10:45am, Pt received sitting on EOB doing a cross word puzzle.  Pt stated " but I just got back from therapy!"  Encouraged pt to bathe, but he stated he wanted to wait until this afternoon.  After several minutes of trying to encourage pt,he said he needed 15 minutes to rest and then I could come back.    Pt worked on UE strength and endurance with self  Propelling w/c to ADL apt.  Pt demonstrated that he can independently step in and out of a bathtub in a safe manner.   He was then taken to the gym to learn a theraband HEP using Level 2 band.  Pt practiced several exercises for 10 reps each with a few short rest breaks.  Chose 4 exercises for pt to focus on at home:  Overhead pull down, sh flex, diagonal pull, and scapular retraction. Pt was able to do those exercises proficiently.   Pt propelled back to his room and rested in w/c for lunch. Theraband and printed exercises left in his room.  Pt is now mod I in his room.   Therapy Documentation Precautions:  Precautions Precautions: Fall Precaution Comments: watch O2 Restrictions Weight Bearing Restrictions: No     Pain:  no c/o pain   Therapy/Group: Individual Therapy  [place] 05/03/2018, 12:11 PM

## 2018-05-03 NOTE — Plan of Care (Signed)
  Problem: Consults Goal: RH GENERAL PATIENT EDUCATION Description See Patient Education module for education specifics. Outcome: Progressing Goal: Skin Care Protocol Initiated - if Braden Score 18 or less Description If consults are not indicated, leave blank or document N/A Outcome: Progressing Goal: Diabetes Guidelines if Diabetic/Glucose > 140 Description If diabetic or lab glucose is > 140 mg/dl - Initiate Diabetes/Hyperglycemia Guidelines & Document Interventions  Outcome: Progressing   Problem: RH BOWEL ELIMINATION Goal: RH STG MANAGE BOWEL WITH ASSISTANCE Description STG Manage Bowel with min Assistance.  Outcome: Progressing Goal: RH STG MANAGE BOWEL W/MEDICATION W/ASSISTANCE Description STG Manage Bowel with Medication with min Assistance.  Outcome: Progressing   Problem: RH SKIN INTEGRITY Goal: RH STG SKIN FREE OF INFECTION/BREAKDOWN Description Patients skin will remain free from further infection or breakdown with mod assist.  Outcome: Progressing Goal: RH STG MAINTAIN SKIN INTEGRITY WITH ASSISTANCE Description STG Maintain Skin Integrity With mod Assistance.  Outcome: Progressing   Problem: RH SAFETY Goal: RH STG ADHERE TO SAFETY PRECAUTIONS W/ASSISTANCE/DEVICE Description STG Adhere to Safety Precautions With min Assistance/Device.  Outcome: Progressing   Problem: RH PAIN MANAGEMENT Goal: RH STG PAIN MANAGED AT OR BELOW [name] PAIN GOAL Description < 3  Outcome: Progressing

## 2018-05-03 NOTE — Progress Notes (Addendum)
Provided pt with handouts about renal diet and went over information with pt. Pt has several questions regarding prognosis, kidney transplant and what things to expect moving forward. Pt states "I don't want to be on dialysis for a long time." referred pt to ask questions at follow up visits with nephrologists. Pam PA made aware of pt's concerns and questions. Continue plan of care.   Travis Bryan

## 2018-05-03 NOTE — Progress Notes (Signed)
[facility]    NEPHROLOGY PROGRESS NOTE  SUBJECTIVE:   DC tomorrow No c/o  OBJECTIVE:  Vitals:   05/03/18 0445 05/03/18 1417  BP: 129/80 116/75  Pulse: 83 84  Resp: 19 20  Temp: 97.7 F (36.5 C) 97.7 F (36.5 C)  SpO2: 98% 100%    Intake/Output Summary (Last 24 hours) at 05/03/2018 1551 Last data filed at 05/03/2018 1230 Gross per 24 hour  Intake 340 ml  Output 1902 ml  Net -1562 ml      Gen[place]l:  NAD HEENT: NCAT a Neck:   RIJ TDC c/d/i CV:  RRR  Lungs:  CTAB Abd:  abd SNT/ND with normal BS Extremities:  No LE edema. LUE BB AVF thrill and bruit Skin:  No skin rash  MEDICATIONS:  . allopurinol  100 mg Oral Daily  . amiodarone  200 mg Oral Daily  . atorvastatin  40 mg Oral q1800  . Chlorhexidine Gluconate Cloth  6 each Topical Q0600  . Chlorhexidine Gluconate Cloth  6 each Topical Q0600  . darbepoetin (ARANESP) injection - DIALYSIS  40 mcg Intravenous Q Tue-HD  . feeding supplement  1 Container Oral Q24H  . feeding supplement (PRO-STAT SUGAR FREE 64)  30 mL Oral BID  . insulin aspart  0-5 Units Subcutaneous QHS  . insulin aspart  0-9 Units Subcutaneous TID WC  . insulin glargine  22 Units Subcutaneous QHS  . multivitamin  1 tablet Oral QHS  . nystatin  5 mL Oral QID  . polyethylene glycol  17 g Oral Daily  . senna-docusate  1 tablet Oral BID  . sevelamer carbonate  800 mg Oral TID WC  . warfarin  7.5 mg Oral ONCE-1800  . Warfarin - Pharmacist Dosing Inpatient   Does not apply q1800       LABS:   CBC Latest Ref Rng & Units 05/02/2018 05/01/2018 04/30/2018  WBC 4.0 - 10.5 K/uL 4.4 4.4 4.3  Hemoglobin 13.0 - 17.0 g/dL 8.8(L) 8.4(L) 8.1(L)  Hematocrit 39.0 - 52.0 % 29.7(L) 27.5(L) 27.2(L)  Platelets 150 - 400 K/uL 288 270 260    CMP Latest Ref Rng & Units 05/02/2018 04/29/2018 04/27/2018  Glucose 70 - 99 mg/dL 119(H) 110(H) 155(H)  BUN 6 - 20 mg/dL 45(H) 36(H) 47(H)  Creatinine 0.61 - 1.24 mg/dL 10.00(H) 9.78(H) 10.52(H)  Sodium 135 - 145  mmol/L 129(L) 133(L) 128(L)  Potassium 3.5 - 5.1 mmol/L 3.5 3.6 4.0  Chloride 98 - 111 mmol/L 94(L) 96(L) 93(L)  CO2 22 - 32 mmol/L 22 26 22   Calcium 8.9 - 10.3 mg/dL 8.7(L) 8.2(L) 8.7(L)  Total Protein 6.5 - 8.1 g/dL - - -  Total Bilirubin 0.3 - 1.2 mg/dL - - -  Alkaline Phos 38 - 126 U/L - - -  AST 15 - 41 U/L - - -  ALT 0 - 44 U/L - - -    Lab Results  Component Value Date   CALCIUM 8.7 (L) 05/02/2018   CAION 1.19 04/15/2018   PHOS 2.6 05/02/2018       Component Value Date/Time   COLORURINE YELLOW 03/28/2018 1040   APPEARANCEUR CLEAR 03/28/2018 1040   LABSPEC 1.015 03/28/2018 1040   PHURINE 7.0 03/28/2018 1040   GLUCOSEU 50 (A) 03/28/2018 1040   HGBUR NEGATIVE 03/28/2018 1040   BILIRUBINUR NEGATIVE 03/28/2018 1040   KETONESUR NEGATIVE 03/28/2018 1040   PROTEINUR 100 (A) 03/28/2018 1040   UROBILINOGEN 0.2 12/18/2006 0428   NITRITE NEGATIVE 03/28/2018 1040   LEUKOCYTESUR NEGATIVE 03/28/2018  1040      Component Value Date/Time   PHART 7.330 (L) 04/15/2018 0435   PCO2ART 47.7 04/15/2018 0435   PO2ART 101.0 04/15/2018 0435   HCO3 25.1 04/15/2018 0435   TCO2 27 04/15/2018 0435   ACIDBASEDEF 1.0 04/15/2018 0435   O2SAT 97.0 04/15/2018 0435       Component Value Date/Time   IRON 23 (L) 04/27/2018 1316   TIBC 197 (L) 04/27/2018 1316   FERRITIN 724 (H) 04/27/2018 1316   IRONPCTSAT 12 (L) 04/27/2018 1316       ASSESSMENT/PLAN:     1.CKD now ESRD: (baseline creatinine 2.5-2.8)cardiorenal versus ATN,  progressed to ESRD at this point. S/p permacath and LUE fistula 2/13. HD TTS schedule Clipped to [facility] at [facility]. HD tomorrow in AM, DC after and next HD 2/25  2.Anemia:Hb  Stable in 8s.  ESA, to rec Fe  3.  Hyponatremia: mild CTM, fluid restrict  4. History of atrial fibrillation/atrial flutter status post DCCV and ablation.He is currently rate controlled.  Warfarin for anticoagulation.  5. Acute exacerbation of CHF with cardiogenic shock:per primary,  resolved.  Maintain euvolemia with UF.   6.  Hyperphosphatemia.  We will continue phosphate binder. P 2.6 on 800mg  aAC sevelamer, check again tomorrow.   7.  DM.  Per primary  8. Dispo: [facility] TTS 2/18 start 12:25 (on hold for CIR).     Pearson Grippe

## 2018-05-03 NOTE — Progress Notes (Signed)
Per outpatient pharmacy, patient has insurance and they could not fill insulin without prior authorization. patient reports that he does not have any insurance. Call number given by pharmacy and attempted to get prior authorization for multiple different brands. Per [facility]--Levemir, Novolog, humalog, lantus, Tyler Aas or Basaglar covered under his plan.  Patient reports that he has some insulin left over from Jan. Rx send to [facility] and patient instructed to go by Monday to pick up Rx.

## 2018-05-03 NOTE — Progress Notes (Signed)
ANTICOAGULATION CONSULT NOTE   Pharmacy Consult for Coumadin Indication: atrial fibrillation  Patient Measurements: Height: 5\' 10"  (177.8 cm) Weight: 231 lb 4.2 oz (104.9 kg) IBW/kg (Calculated) : 73  Heparin dosing weight: 99 kg  Vital Signs: Temp: 97.7 F (36.5 C) (02/21 0445) Temp Source: Oral (02/21 0445) BP: 129/80 (02/21 0445) Pulse Rate: 83 (02/21 0445)  Labs: Recent Labs    05/01/18 0529 05/02/18 0839 05/02/18 1349 05/03/18 0530  HGB 8.4*  --  8.8*  --   HCT 27.5*  --  29.7*  --   PLT 270  --  288  --   LABPROT 21.1* 23.2*  --  24.2*  INR 1.85 2.10  --  2.21  HEPARINUNFRC 0.50 0.34  --   --   CREATININE  --   --  10.00*  --    Assessment: 48 year old male presenting on 03/28/18 with NV and fever, h/o afib.  On PTA Coumadin 5mg  daily exc for 7.5mg  on MWF for hx afib. Admit INR therapeutic. Then transitioned to heparin for AVG placement and now bridged back to Coumadin. INR up to 2.21 this morning. Hgb 8.8, plts wnl. No s/s of bleed.  Goal of Therapy:  INR 2-3 Heparin level 0.3-0.7 units/ml Monitor platelets by anticoagulation protocol: Yes   Plan:  Give Coumadin 7.5mg  PO x 1 Monitor daily heparin level / INR, CBC, s/s of bleed  Elenor Quinones, PharmD, BCPS, [facility] Clinical Pharmacist Phone number [phone] 05/03/2018 7:29 AM

## 2018-05-03 NOTE — Progress Notes (Signed)
Travis Bryan PHYSICAL MEDICINE & REHABILITATION PROGRESS NOTE   Subjective/Complaints: Up in chair. Excited about going home "so I can eat some real food!"  ROS: Patient denies fever, rash, sore throat, blurred vision, nausea, vomiting, diarrhea, cough, shortness of breath or chest pain, joint or back pain, headache, or mood change.    Objective:   No results found. Recent Labs    05/01/18 0529 05/02/18 1349  WBC 4.4 4.4  HGB 8.4* 8.8*  HCT 27.5* 29.7*  PLT 270 288   Recent Labs    05/02/18 1349  NA 129*  K 3.5  CL 94*  CO2 22  GLUCOSE 119*  BUN 45*  CREATININE 10.00*  CALCIUM 8.7*    Intake/Output Summary (Last 24 hours) at 05/03/2018 0911 Last data filed at 05/02/2018 2030 Gross per 24 hour  Intake 578.64 ml  Output 1902 ml  Net -1323.36 ml     Physical Exam: Vital Signs Blood pressure 129/80, pulse 83, temperature 97.7 F (36.5 C), temperature source Oral, resp. rate 19, height 5\' 10"  (1.778 m), weight 104.9 kg, SpO2 98 %. Constitutional: No distress . Vital signs reviewed. HEENT: EOMI, oral membranes moist Neck: supple Cardiovascular: RRR without murmur. No JVD    Respiratory: CTA Bilaterally without wheezes or rales. Normal effort    GI: BS +, non-tender, non-distended  Musculoskeletal:        General: No tenderness in trunk or limbs today. Neurological: He is alert and oriented to person, place, and time.    Slow processing, fair insight Good sitting balance  Motor: Grossly 4 to 4+/5 prox to distal bilaterally.   Skin: Skin is warm and dry.  Multiple healed scars and incisions without changes Psychiatric: Flat but cooperative     Assessment/Plan: 1. Functional deficits secondary to debility/encephalopathy which require 3+ hours per day of interdisciplinary therapy in a comprehensive inpatient rehab setting.  Physiatrist is providing close team supervision and 24 hour management of active medical problems listed below.  Physiatrist and rehab  team continue to assess barriers to discharge/monitor patient progress toward functional and medical goals  Care Tool:  Bathing    Body parts bathed by patient: Right arm, Left arm, Chest, Abdomen, Right upper leg, Left upper leg, Face, Front perineal area, Buttocks, Right lower leg, Left lower leg   Body parts bathed by helper: (did not complete LB due to arrival of x-ray)     Bathing assist Assist Level: Supervision/Verbal cueing     Upper Body Dressing/Undressing Upper body dressing   What is the patient wearing?: Hospital gown only    Upper body assist Assist Level: Supervision/Verbal cueing    Lower Body Dressing/Undressing Lower body dressing      What is the patient wearing?: Pants     Lower body assist Assist for lower body dressing: Supervision/Verbal cueing     Toileting Toileting Toileting Activity did not occur (Clothing management and hygiene only): N/A (no void or bm)  Toileting assist Assist for toileting: Minimal Assistance - Patient > 75%     Transfers Chair/bed transfer  Transfers assist     Chair/bed transfer assist level: Supervision/Verbal cueing     Locomotion Ambulation   Ambulation assist      Assist level: Supervision/Verbal cueing   Max distance: 200   Walk 10 feet activity   Assist     Assist level: Supervision/Verbal cueing     Walk 50 feet activity   Assist    Assist level: Supervision/Verbal cueing  Walk 150 feet activity   Assist    Assist level: Supervision/Verbal cueing      Walk 10 feet on uneven surface  activity   Assist     Assist level: Supervision/Verbal cueing     Wheelchair     Assist Will patient use wheelchair at discharge?: No Type of Wheelchair: Manual    Wheelchair assist level: Supervision/Verbal cueing Max wheelchair distance: 181ft    Wheelchair 50 feet with 2 turns activity    Assist            Wheelchair 150 feet activity     Assist  Wheelchair 150 feet activity did not occur: Safety/medical concerns(Per doc 120 ft max distance )        Medical Problem List and Plan: 1.  Deficits with mobility, endurance, and self-care secondary to debility/encephalopathy.             --Continue CIR therapies including PT, OT, SLP  -dc 05/04/2018 after hemodialysis  -follow up with cardiology/nephrology 2.  A fib/DVT Prophylaxis/Anticoagulation: dc coumadin today 3. Pain Management: Hydrocodone prn for LUE pain.  4. Mood: LCSW to follow for evaluation and support.  5. Neuropsych: This patient is not fully capable of making decisions on his own behalf. 6. Skin/Wound Care: Routine pressure relief measures.  7. Fluids/Electrolytes/Nutrition: Strict I/O. Check weights daily.  Appreciate dietitians assistance.  8. AKI on CKD: Likely ESRD- continue HD TTS. On phosphate binders and 1200 cc FR.  Scheduled HD at the end of the day to help with tolerance of therapy. .  9. H/o Afib/A flutter: Continues to have bouts of PAF. HR controlled on amiodarone. No BB due to soft BP.  10. Anemia of chronic disease: iron panel on 2/15 consistent with this  -Monitored serially     -hgb 9.2 2/15, down to 8.5 on 2/16---->8.6 2/17--->8.1 2/18--> 8.8 on 2/20  -tranfusions/supps per nephrology  -  CBCs  dialysis days only   11. T2DM: Monitor BS ac/hs. Continue Lantus 22 units at bedtime. Added scheduled meal coverage for tighter control.  -reasonable control 2/19 12.  Decompensated heart failure: Managed with HD--hyponatremia resolved.    FR   1200cc.    -volume mgt with HD--for HD today Filed Weights   05/02/18 1335 05/02/18 1735 05/03/18 0445  Weight: 105.3 kg 103.2 kg 104.9 kg     -Patient off oxygen now and sating in the 90s 13. Low protein malnutrition: ON nephro. Will add prostat also. 64. Metavirus PNA/HCAP: Has completed antibiotic regimen. Encourage pulmonary hygiene. Wean supplemental oxygen as tolerated--has been using prn in the month PTA.    15.  RLQ pain, N/V: Resolved   -Both KUB and CT of the abdomen are unremarkable.  -moving bowels  -likely musculoskeletal, ?behavioral component  LOS: 7 days A FACE TO FACE EVALUATION WAS PERFORMED  Meredith Staggers 05/03/2018, 9:11 AM

## 2018-05-03 NOTE — Progress Notes (Signed)
Physical Therapy Discharge Summary  Patient Details  Name: Travis Bryan MRN: [id number] Date of Birth: [birth year]  Today's Date: 05/03/2018 PT Individual Time: 7124-5809 PT Individual Time Calculation (min): 60 min   Patient seen for individual treatment session consisting of mobility skills as noted below.  Also discussed fall prevention and self monitoring for activity tolerance due to not symptomatic with drop in SpO2.  Patient verbalized understanding off all and made mod I in his room for remainder of his stay on rehab.  NT made aware as well and pt left with call bell seated EOB.    Patient has met 9 of 9 long term goals due to improved activity tolerance, improved balance and increased strength.  Patient to discharge at an ambulatory level Independent.   Patient's care partner unavailable to provide the necessary Patient modified independent with mobility skills assistance at discharge.  Reasons goals not met: N/A able to achieve all LTG's  Recommendation:  Patient will benefit from ongoing skilled PT services in home health setting to continue to advance safe functional mobility, address ongoing impairments in endurance, home safety, and minimize fall risk.  Equipment: No equipment provided  Reasons for discharge: treatment goals met and discharge from hospital  Patient/family agrees with progress made and goals achieved: Yes  PT Discharge Precautions/Restrictions Precautions Precautions: Other (comment) Precaution Comments: watch O2 Restrictions Weight Bearing Restrictions: No Vital Signs Therapy Vitals Temp: 97.7 F (36.5 C) Pulse Rate: 84 Resp: 20 BP: 116/75 Patient Position (if appropriate): Sitting Oxygen Therapy SpO2: 100 % O2 Device: Room Air Pain Pain Assessment Pain Score: 0-No pain Vision/Perception     Cognition Overall Cognitive Status: No family/caregiver present to determine baseline cognitive functioning Arousal/Alertness:  Awake/alert Orientation Level: Oriented X4 Safety/Judgment: Appears intact Sensation Sensation Light Touch: Appears Intact Proprioception: Appears Intact Coordination Gross Motor Movements are Fluid and Coordinated: Yes Fine Motor Movements are Fluid and Coordinated: Yes Motor  Motor Motor - Discharge Observations: Some generalized weakness, but much improved since eval  Mobility Transfers Sit to Stand: Independent Transfer (Assistive device): None Locomotion  Gait Gait Assistance: Independent Gait Distance (Feet): 200 Feet Assistive device: None Stairs / Additional Locomotion Stairs: Yes Stairs Assistance: Supervision/Verbal cueing Stair Management Technique: Two rails Number of Stairs: 12 Height of Stairs: 6 Ramp: Supervision/Verbal cueing Curb: Engineer, maintenance (IT) Assistance: Chartered loss adjuster: Both upper extremities Wheelchair Parts Management: Supervision/cueing;Needs assistance Distance: 150  Trunk/Postural Assessment  Cervical Assessment Cervical Assessment: Within Functional Limits Thoracic Assessment Thoracic Assessment: Within Functional Limits Lumbar Assessment Lumbar Assessment: Within Functional Limits Postural Control Postural Control: Within Functional Limits  Balance Standardized Balance Assessment Standardized Balance Assessment: Berg Balance Test Berg Balance Test Sit to Stand: Able to stand without using hands and stabilize independently Standing Unsupported: Able to stand safely 2 minutes Sitting with Back Unsupported but Feet Supported on Floor or Stool: Able to sit safely and securely 2 minutes Stand to Sit: Sits safely with minimal use of hands Transfers: Able to transfer safely, minor use of hands Standing Unsupported with Eyes Closed: Able to stand 10 seconds safely Standing Ubsupported with Feet Together: Able to place feet together independently and stand 1 minute  safely From Standing, Reach Forward with Outstretched Arm: Can reach confidently >25 cm (10") From Standing Position, Pick up Object from Floor: Able to pick up shoe safely and easily From Standing Position, Turn to Look Behind Over each Shoulder: Looks behind from both sides and weight shifts well Turn 360 Degrees: Able to turn  360 degrees safely but slowly Standing Unsupported, Alternately Place Feet on Step/Stool: Able to stand independently and safely and complete 8 steps in 20 seconds Standing Unsupported, One Foot in Front: Able to take small step independently and hold 30 seconds Standing on One Leg: Tries to lift leg/unable to hold 3 seconds but remains standing independently Total Score: 49 Dynamic Sitting Balance Dynamic Sitting - Level of Assistance: 7: Independent Static Standing Balance Static Standing - Level of Assistance: 7: Independent Dynamic Standing Balance Dynamic Standing - Level of Assistance: 7: Independent Extremity Assessment  RUE Assessment RUE Assessment: Within Functional Limits LUE Assessment LUE Assessment: Within Functional Limits RLE Assessment RLE Assessment: Within Functional Limits LLE Assessment LLE Assessment: Within Functional Limits    Reginia Naas 05/03/2018, 5:29 PM

## 2018-05-03 NOTE — Progress Notes (Signed)
Occupational Therapy Discharge Summary  Patient Details  Name: Gildardo Tickner MRN: [id number] Date of Birth: 10-13-1970  Today's Date: 05/03/2018 OT Individual Time: 1330-1430 OT Individual Time Calculation (min): 60 min    Pt greeted sitting in wc at the sink about to begin bathing tasks as he had been made mod I in the room from previous therapist. Pt completed all bathing/dressing tasks at mod I level. Pt with no LOB and demonstrated good safety awareness within BADLs.  OT educated on energy conservation techniques and reviewed home set-up and modifications for safe BADL participation. Discussed where pt will sponge bathe as he is unable to shower 2/2 dialysis port at this time. Pt participated in community reintegration at mod I level down to hospital cafeteria. Goals focused on safe community mobility, ambulating on uneven surfaces, accessing public restroom, and energy conservation techniques/education. Pt returned to room and left seated in wc with needs met.    Patient has met 9 of 9 long term goals due to improved activity tolerance, improved balance, postural control, ability to compensate for deficits, improved attention, improved awareness and improved coordination.  Patient to discharge at overall Independent level.  Patient's care partner is independent to provide the necessary physical assistance at discharge for higher level iADL tasks.    Reasons goals not met: n/a  Recommendation:  Patient will benefit from ongoing skilled OT services in home health setting to continue to advance functional skills in the area of BADL.  Equipment: No equipment provided  Reasons for discharge: treatment goals met and discharge from hospital  Patient/family agrees with progress made and goals achieved: Yes  OT Discharge Precautions/Restrictions  Precautions Precautions: Fall Restrictions Weight Bearing Restrictions: No General OT Amount of Missed Time: 15 Minutes -pt fatigue Pain Pain  Assessment Pain Scale: 0-10 Pain Score: 0-No pain ADL ADL Eating: Independent Where Assessed-Eating: Chair Grooming: Independent Where Assessed-Grooming: Standing at sink Upper Body Bathing: Independent Where Assessed-Upper Body Bathing: Standing at sink Lower Body Bathing: Independent Where Assessed-Lower Body Bathing: Sitting at sink, Standing at sink Upper Body Dressing: Independent Where Assessed-Upper Body Dressing: Sitting at sink Lower Body Dressing: Independent Where Assessed-Lower Body Dressing: Sitting at sink Toileting: Independent Where Assessed-Toileting: Glass blower/designer: Programmer, applications Method: Ambulating Cognition Orientation Level: Oriented X4 Safety/Judgment: Appears intact Sensation Sensation Light Touch: Appears Intact Proprioception: Appears Intact Coordination Gross Motor Movements are Fluid and Coordinated: Yes Fine Motor Movements are Fluid and Coordinated: Yes Motor  Motor Motor - Discharge Observations: Some generalized weakness, but much improved since eval Mobility  Transfers Sit to Stand: Independent  Trunk/Postural Assessment  Cervical Assessment Cervical Assessment: Within Functional Limits Thoracic Assessment Thoracic Assessment: Within Functional Limits Lumbar Assessment Lumbar Assessment: Within Functional Limits  Balance Dynamic Sitting Balance Dynamic Sitting - Level of Assistance: 7: Independent Static Standing Balance Static Standing - Level of Assistance: 7: Independent Dynamic Standing Balance Dynamic Standing - Level of Assistance: 7: Independent Extremity/Trunk Assessment RUE Assessment RUE Assessment: Within Functional Limits LUE Assessment LUE Assessment: Within Functional Limits   Daneen Schick Brittanni Cariker 05/03/2018, 2:31 PM

## 2018-05-04 DIAGNOSIS — R5381 Other malaise: Principal | ICD-10-CM

## 2018-05-04 LAB — CBC WITH DIFFERENTIAL/PLATELET
Abs Immature Granulocytes: 0.02 10*3/uL (ref 0.00–0.07)
Basophils Absolute: 0 10*3/uL (ref 0.0–0.1)
Basophils Relative: 0 %
Eosinophils Absolute: 0.3 10*3/uL (ref 0.0–0.5)
Eosinophils Relative: 5 %
HCT: 29.1 % — ABNORMAL LOW (ref 39.0–52.0)
Hemoglobin: 8.4 g/dL — ABNORMAL LOW (ref 13.0–17.0)
Immature Granulocytes: 0 %
Lymphocytes Relative: 21 %
Lymphs Abs: 1.2 10*3/uL (ref 0.7–4.0)
MCH: 27.8 pg (ref 26.0–34.0)
MCHC: 28.9 g/dL — ABNORMAL LOW (ref 30.0–36.0)
MCV: 96.4 fL (ref 80.0–100.0)
MONOS PCT: 13 %
Monocytes Absolute: 0.7 10*3/uL (ref 0.1–1.0)
Neutro Abs: 3.3 10*3/uL (ref 1.7–7.7)
Neutrophils Relative %: 61 %
Platelets: 284 10*3/uL (ref 150–400)
RBC: 3.02 MIL/uL — ABNORMAL LOW (ref 4.22–5.81)
RDW: 15.7 % — ABNORMAL HIGH (ref 11.5–15.5)
WBC: 5.5 10*3/uL (ref 4.0–10.5)
nRBC: 0 % (ref 0.0–0.2)

## 2018-05-04 LAB — RENAL FUNCTION PANEL
Albumin: 2.2 g/dL — ABNORMAL LOW (ref 3.5–5.0)
Anion gap: 11 (ref 5–15)
BUN: 40 mg/dL — ABNORMAL HIGH (ref 6–20)
CO2: 25 mmol/L (ref 22–32)
Calcium: 8.6 mg/dL — ABNORMAL LOW (ref 8.9–10.3)
Chloride: 99 mmol/L (ref 98–111)
Creatinine, Ser: 8.53 mg/dL — ABNORMAL HIGH (ref 0.61–1.24)
GFR calc Af Amer: 8 mL/min — ABNORMAL LOW (ref >60)
GFR calc non Af Amer: 7 mL/min — ABNORMAL LOW (ref >60)
GLUCOSE: 128 mg/dL — AB (ref 70–99)
Phosphorus: 2 mg/dL — ABNORMAL LOW (ref 2.5–4.6)
Potassium: 3.5 mmol/L (ref 3.5–5.1)
Sodium: 135 mmol/L (ref 135–145)

## 2018-05-04 LAB — PROTIME-INR
INR: 2.44
Prothrombin Time: 26.1 seconds — ABNORMAL HIGH (ref 11.4–15.2)

## 2018-05-04 LAB — GLUCOSE, CAPILLARY: Glucose-Capillary: 105 mg/dL — ABNORMAL HIGH (ref 70–99)

## 2018-05-04 MED ORDER — HEPARIN SODIUM (PORCINE) 1000 UNIT/ML DIALYSIS
20.0000 [IU]/kg | INTRAMUSCULAR | Status: DC | PRN
  Filled 2018-05-04: qty 3

## 2018-05-04 MED ORDER — HEPARIN SODIUM (PORCINE) 1000 UNIT/ML IJ SOLN
INTRAMUSCULAR | Status: AC
  Administered 2018-05-04: 1000 [IU]
  Filled 2018-05-04: qty 4

## 2018-05-04 MED ORDER — WARFARIN SODIUM 5 MG PO TABS
5.0000 mg | ORAL_TABLET | Freq: Once | ORAL | Status: DC
  Filled 2018-05-04: qty 1

## 2018-05-04 NOTE — Progress Notes (Signed)
Patient discharged to home, accompanied by his wife. 

## 2018-05-04 NOTE — Procedures (Signed)
I was present at this dialysis session. I have reviewed the session itself and made appropriate changes.   3L UF goal, Qb 400 through [facility].  Has paperwork for [facility] first Tx 2/25. Would hold binders at DC given P of 2.0.     Filed Weights   05/02/18 1735 05/03/18 0445 05/04/18 0810  Weight: 103.2 kg 104.9 kg 105.7 kg    Recent Labs  Lab 05/04/18 0834  NA 135  K 3.5  CL 99  CO2 25  GLUCOSE 128*  BUN 40*  CREATININE 8.53*  CALCIUM 8.6*  PHOS 2.0*    Recent Labs  Lab 05/01/18 0529 05/02/18 1349 05/04/18 0834  WBC 4.4 4.4 5.5  NEUTROABS  --   --  3.3  HGB 8.4* 8.8* 8.4*  HCT 27.5* 29.7* 29.1*  MCV 93.9 94.3 96.4  PLT 270 288 284    Scheduled Meds: . allopurinol  100 mg Oral Daily  . amiodarone  200 mg Oral Daily  . atorvastatin  40 mg Oral q1800  . Chlorhexidine Gluconate Cloth  6 each Topical Q0600  . Chlorhexidine Gluconate Cloth  6 each Topical Q0600  . darbepoetin (ARANESP) injection - DIALYSIS  40 mcg Intravenous Q Tue-HD  . feeding supplement  1 Container Oral Q24H  . feeding supplement (PRO-STAT SUGAR FREE 64)  30 mL Oral BID  . insulin aspart  0-5 Units Subcutaneous QHS  . insulin aspart  0-9 Units Subcutaneous TID WC  . insulin glargine  22 Units Subcutaneous QHS  . multivitamin  1 tablet Oral QHS  . nystatin  5 mL Oral QID  . polyethylene glycol  17 g Oral Daily  . senna-docusate  1 tablet Oral BID  . sevelamer carbonate  800 mg Oral TID WC  . warfarin  5 mg Oral ONCE-1800  . Warfarin - Pharmacist Dosing Inpatient   Does not apply q1800   Continuous Infusions: . ferric gluconate (FERRLECIT/NULECIT) IV Stopped (05/02/18 1718)   PRN Meds:.acetaminophen, albuterol, alum & mag hydroxide-simeth, bisacodyl, diphenhydrAMINE, diphenhydrAMINE, guaiFENesin-dextromethorphan, heparin, lip balm, polyethylene glycol, prochlorperazine **OR** prochlorperazine **OR** prochlorperazine, sodium phosphate, traZODone   Pearson Grippe  MD 05/04/2018, 10:46 AM

## 2018-05-04 NOTE — Progress Notes (Signed)
ANTICOAGULATION CONSULT NOTE   Pharmacy Consult for Coumadin Indication: atrial fibrillation  Patient Measurements: Height: 5\' 10"  (177.8 cm) Weight: 233 lb 0.4 oz (105.7 kg) IBW/kg (Calculated) : 73  Heparin dosing weight: 99 kg  Vital Signs: Temp: 98.5 F (36.9 C) (02/22 0815) Temp Source: Oral (02/22 0815) BP: 128/81 (02/22 0930) Pulse Rate: 80 (02/22 0930)  Labs: Recent Labs    05/02/18 0839 05/02/18 1349 05/03/18 0530 05/04/18 0557 05/04/18 0834  HGB  --  8.8*  --   --  8.4*  HCT  --  29.7*  --   --  29.1*  PLT  --  288  --   --  284  LABPROT 23.2*  --  24.2* 26.1*  --   INR 2.10  --  2.21 2.44  --   HEPARINUNFRC 0.34  --   --   --   --   CREATININE  --  10.00*  --   --  8.53*   Assessment: 48 year old male presenting on 03/28/18 with NV and fever, h/o afib.  On PTA Coumadin 5mg  daily exc for 7.5mg  on MWF for hx afib. Admit INR therapeutic. Then transitioned to heparin for AVG placement and now bridged back to Coumadin. INR up to 2.44 this morning. Hgb 8.4, plts wnl. No s/s of bleed.  Goal of Therapy:  INR 2-3 Heparin level 0.3-0.7 units/ml Monitor platelets by anticoagulation protocol: Yes   Plan:  Give Coumadin 5mg  PO x 1 Monitor daily heparin level / INR, CBC, s/s of bleed  Elenor Quinones, PharmD, BCPS, [facility] Clinical Pharmacist Phone number [phone] 05/04/2018 10:26 AM

## 2018-05-04 NOTE — Progress Notes (Signed)
Travis Bryan is a 48 y.o. male is admitted for CIR with debility and encephalopathy.  Past Medical History:  Diagnosis Date  . AICD (automatic cardioverter/defibrillator) present   . Atrial flutter ([place])    a. s/p ablation 03/2016  . Chronic systolic CHF (congestive heart failure) ([place])   . HCAP (healthcare-associated pneumonia) 03/28/2018  . History of gout   . Hyperlipidemia   . Hypertension   . Myocardial infarction [facility])    "I think I had one a long long time ago" (03/28/2016)  . NICM (nonischemic cardiomyopathy) ([place])    a. LHC 6/06: pLAD 20, pLCx 20-30; b. Echo 5/15:  EF 15%, diffuse HK, restrictive physiology, trivial AI, trivial MR, mild LAE, moderate RVE, moderately reduced RVSF, moderate RAE, mild to moderate TR, PASP 43 mmHg  . Obesity   . Persistent atrial fibrillation   . Type II diabetes mellitus ([facility])      Subjective: No new complaints. No new problems.  Doing well.  Excited about anticipated discharge today.  Presently completing today's hemodialysis session  Objective: Vital signs in last 24 hours: Temp:  [97.7 F (36.5 C)-99.4 F (37.4 C)] 98.5 F (36.9 C) (02/22 0815) Pulse Rate:  [72-97] 97 (02/22 1100) Resp:  [15-20] 17 (02/22 1030) BP: (107-134)/(65-88) 120/80 (02/22 1100) SpO2:  [92 %-100 %] 95 % (02/22 0900) Weight:  [105.7 kg] 105.7 kg (02/22 0810) Weight change:  Last BM Date: 05/03/18  Intake/Output from previous day: 02/21 0701 - 02/22 0700 In: 220 [P.O.:220] Out: -  Last cbgs: CBG (last 3)  Recent Labs    05/03/18 1652 05/03/18 2128 05/04/18 0613  GLUCAP 143* 151* 105*     Physical Exam General: No apparent distress;  patient examined in HD unit HEENT: not dry Lungs: Normal effort. Lungs clear to auscultation, no crackles or wheezes. Cardiovascular: Regular rate and rhythm, no edema Abdomen: S/NT/ND; BS(+) Musculoskeletal:  unchanged Neurological: No new neurological deficits; mild generalized weakness Wounds: N/A    Skin:  clear   Mental state: Alert, oriented, cooperative    Lab Results: BMET    Component Value Date/Time   NA 135 05/04/2018 0834   NA 141 04/20/2016 1409   K 3.5 05/04/2018 0834   CL 99 05/04/2018 0834   CO2 25 05/04/2018 0834   GLUCOSE 128 (H) 05/04/2018 0834   BUN 40 (H) 05/04/2018 0834   BUN 43 (H) 04/20/2016 1409   CREATININE 8.53 (H) 05/04/2018 0834   CREATININE 1.46 (H) 03/16/2015 1431   CALCIUM 8.6 (L) 05/04/2018 0834   GFRNONAA 7 (L) 05/04/2018 0834   GFRNONAA 56 (L) 07/28/2010 1633   GFRAA 8 (L) 05/04/2018 0834   GFRAA >60 07/28/2010 1633   CBC    Component Value Date/Time   WBC 5.5 05/04/2018 0834   RBC 3.02 (L) 05/04/2018 0834   HGB 8.4 (L) 05/04/2018 0834   HCT 29.1 (L) 05/04/2018 0834   PLT 284 05/04/2018 0834   MCV 96.4 05/04/2018 0834   MCH 27.8 05/04/2018 0834   MCHC 28.9 (L) 05/04/2018 0834   RDW 15.7 (H) 05/04/2018 0834   LYMPHSABS 1.2 05/04/2018 0834   MONOABS 0.7 05/04/2018 0834   EOSABS 0.3 05/04/2018 0834   BASOSABS 0.0 05/04/2018 0834    Medications: I have reviewed the patient's current medications.  Assessment/Plan:  Functional deficits secondary to debility/encephalopathy End-stage renal disease History of A. Fib/flutter Type 2 diabetes mellitus.  Continue Lantus and meal coverage as needed History of decompensated heart failure.   Patient is stable  for discharge   Length of stay, days: 8  Marletta Lor , MD 05/04/2018, 11:43 AM

## 2018-05-06 ENCOUNTER — Other Ambulatory Visit: Payer: Self-pay

## 2018-05-06 ENCOUNTER — Telehealth (HOSPITAL_COMMUNITY): Payer: Self-pay | Admitting: Physical Medicine and Rehabilitation

## 2018-05-06 MED FILL — !LANTUS 100 UNITS/ML VIAL: 100 | 28 days supply | Qty: 10 | Fill #0

## 2018-05-06 MED FILL — !NOVOLOG 100UNITS/ML VIAL: 100/ML | 28 days supply | Qty: 10 | Fill #0

## 2018-05-06 NOTE — Progress Notes (Signed)
Social Work  Discharge Note  The overall goal for the admission was met for:   Discharge location: Yes - returning home with aunt and uncle who can provide 24/7 supervision  Length of Stay: Yes - 7 days  Discharge activity level: Yes - supervision  Home/community participation: Yes  Services provided included: MD, RD, PT, OT, SLP, RN, TR, Pharmacy and SW  Financial Services: uninsured, however, [employer] counseling assisting with completion of SSD and Medicaid applications  Follow-up services arranged: Home Health: PT, OT via [place], DME: tub seat via [facility] and Patient/Family has no preference for HH/DME agencies  Comments (or additional information):  Contact info: Pt at [phone] Debroah [facility]er [facility] @ [phone]  Patient/Family verbalized understanding of follow-up arrangements: Yes  Individual responsible for coordination of the follow-up plan: pt  Confirmed correct DME delivered: Lennart Pall 05/06/2018    Meril Dray

## 2018-05-06 NOTE — Telephone Encounter (Signed)
Message left for patient/aunt to call for instructions regarding clarification of medications.

## 2018-05-06 NOTE — Discharge Instructions (Signed)
Inpatient Rehab Discharge Instructions  Travis Bryan Discharge date and time:  05/04/18  Activities/Precautions/ Functional Status: Activity: no lifting, driving, or strenuous exercise cleared by MD Diet: diabetic diet and renal diet Limit fluids to 5 cups daily Wound Care: keep wound clean and dry    Functional status:  ___ No restrictions     ___ Walk up steps independently ___ 24/7 supervision/assistance   ___ Walk up steps with assistance _X__ Intermittent supervision/assistance  _X__ Bathe/dress independently ___ Walk with walker     ___ Bathe/dress with assistance ___ Walk Independently    ___ Shower independently ___ Walk with assistance    ___ Shower with assistance _X__ No alcohol     ___ Return to work/school ________    COMMUNITY REFERRALS UPON DISCHARGE:    Home Health:   PT     OT                      Agency:  [place] [facility] Phone: [phone]   Medical Equipment/Items Ordered:  Tub seat                                                      Agency/Supplier:  [facility] @ [phone]      Special Instructions: 1. Monitor blood sugars before meals and at bedtime. Use Novolog 3 units with meals if blood sugars are greater than 80 and you plans on eating most of your meals.  2. NO showers till cleared by Renal or Vascular surgeon.   My questions have been answered and I understand these instructions. I will adhere to these goals and the provided educational materials after my discharge from the hospital.  Patient/Caregiver Signature _______________________________ Date __________  Clinician Signature _______________________________________ Date __________  Please bring this form and your medication list with you to all your follow-up doctor's appointments.

## 2018-05-06 NOTE — Consult Note (Signed)
Received and  reviewed a referral request from inpatient rehab case manager regarding post hospital community follow up with this patient.  Chart reveals patient did not have insurance but applied for Medicaid per Lorre Nick, LCSW referral source. [facility] Care management does not currently follow Medicaid beneficiaries alone,  Call placed to patient at preferred phone number in Epic [phone] and left a generic message requesting a return call on voice mail "Tim".  Will await a return call to complete consult for this referral.  For questions, please contact:  Natividad Brood, RN BSN [facility] Liaison  [phone] business mobile phone Toll free office [phone]

## 2018-05-08 ENCOUNTER — Telehealth: Payer: Self-pay | Admitting: Registered Nurse

## 2018-05-08 NOTE — Telephone Encounter (Addendum)
Hospital Follow Up call Patient name: Travis Bryan  DOB: May 29, 1970 1. Are you/is patient experiencing any problems since coming home? No a. Are there any questions regarding any aspect of care? No 2. Are there any questions regarding medications administration/dosing?No a. Are meds being taken as prescribed? Mr. Guyton reports he doesn't have all the medications, will place a call to his pharmact and send message to [place], he verbalizes understanding.  Placed a call to [place], he received all his medications except Miralax, Senna, multiple vitamin, he didn't want to pay for these items. Spoke with Olin Hauser she sent his insulin to [facility] and wellness, instructed Mr. Gerstenberger to go to [employer] [facility] [employer] and wellness to pick up his insulin, he verbalizes understanding.  b. "Patient should review meds with caller to confirm" Medication list reviewed 3. Have there been any falls? No 4. Has Home Health been to the house and/or have they contacted you? No a. If not, have you tried to contact them? No b. Can we help you contact them? Mr. Wesche stated he doesn't want this provider to call [place].  5. Are bowels and bladder emptying properly? Yes Bowels/ ESRD on Tuesday/ Thursdayy and Saturday a. Are there any unexpected incontinence issues? No b. If applicable, is patient following bowel/bladder programs? NA 6. Any fevers, problems with breathing, unexpected pain? No 7. Are there any skin problems or new areas of breakdown? No 8. Has the patient/family member arranged specialty MD follow up (ie cardiology/neurology/renal/surgical/etc.)?  HFU appointments have been arranged. a. Can we help arrange? NA 9. Does the patient need any other services or support that we can help arrange? NO 10. Are caregivers following through as expected in assisting the patient? No 11. Has the patient quit smoking, drinking alcohol, or using drugs as recommended? Mr. Current,  denies smoking, drinking alcohol or use of illicit drugs.   Appointment date/time 05/22/2018  arrival time 11:20 for 11:40 appointment With Dr. Naaman Plummer. At [place]

## 2018-05-10 ENCOUNTER — Ambulatory Visit (INDEPENDENT_AMBULATORY_CARE_PROVIDER_SITE_OTHER): Payer: Medicaid Other

## 2018-05-10 DIAGNOSIS — Z95810 Presence of automatic (implantable) cardiac defibrillator: Secondary | ICD-10-CM | POA: Diagnosis not present

## 2018-05-10 DIAGNOSIS — I483 Typical atrial flutter: Secondary | ICD-10-CM

## 2018-05-10 DIAGNOSIS — Z5181 Encounter for therapeutic drug level monitoring: Secondary | ICD-10-CM | POA: Diagnosis not present

## 2018-05-10 DIAGNOSIS — I4819 Other persistent atrial fibrillation: Secondary | ICD-10-CM

## 2018-05-10 LAB — POCT INR: INR: 1.2 — AB (ref 2.0–3.0)

## 2018-05-10 NOTE — Patient Instructions (Signed)
Description   Take 2 tablets today, then take 1.5 tablets tomorrow, then resume same dosage 1 tablet daily except 1.5 tablets on Mondays, Wednesdays, and Fridays.  Recheck in 1 week.  Coumadin Clinic [phone]. Do 2 servings of greens a week

## 2018-05-13 ENCOUNTER — Ambulatory Visit (INDEPENDENT_AMBULATORY_CARE_PROVIDER_SITE_OTHER): Payer: Medicaid Other | Admitting: Family Medicine

## 2018-05-13 ENCOUNTER — Encounter: Payer: Self-pay | Admitting: Family Medicine

## 2018-05-13 VITALS — BP 117/79 | HR 82 | Resp 17 | Ht 69.5 in | Wt 228.0 lb

## 2018-05-13 DIAGNOSIS — E1169 Type 2 diabetes mellitus with other specified complication: Secondary | ICD-10-CM

## 2018-05-13 DIAGNOSIS — Z7689 Persons encountering health services in other specified circumstances: Secondary | ICD-10-CM

## 2018-05-13 DIAGNOSIS — Z794 Long term (current) use of insulin: Secondary | ICD-10-CM

## 2018-05-13 DIAGNOSIS — I5022 Chronic systolic (congestive) heart failure: Secondary | ICD-10-CM | POA: Diagnosis not present

## 2018-05-13 DIAGNOSIS — I480 Paroxysmal atrial fibrillation: Secondary | ICD-10-CM

## 2018-05-13 DIAGNOSIS — N185 Chronic kidney disease, stage 5: Secondary | ICD-10-CM

## 2018-05-13 DIAGNOSIS — E1122 Type 2 diabetes mellitus with diabetic chronic kidney disease: Secondary | ICD-10-CM | POA: Diagnosis not present

## 2018-05-13 DIAGNOSIS — E785 Hyperlipidemia, unspecified: Secondary | ICD-10-CM

## 2018-05-13 LAB — GLUCOSE, POCT (MANUAL RESULT ENTRY): POC GLUCOSE: 111 mg/dL — AB (ref 70–99)

## 2018-05-13 MED ORDER — INSULIN ASPART 100 UNIT/ML ~~LOC~~ SOLN: SUBCUTANEOUS | 3 refills | Status: DC

## 2018-05-13 MED ORDER — INSULIN GLARGINE 100 UNIT/ML ~~LOC~~ SOLN: 22.0000 [IU] | Freq: Every day | SUBCUTANEOUS | 5 refills | Status: DC

## 2018-05-13 MED ORDER — ATORVASTATIN CALCIUM 40 MG PO TABS: 40.0000 mg | ORAL_TABLET | Freq: Every day | ORAL | 2 refills | Status: DC

## 2018-05-13 MED ORDER — SEVELAMER CARBONATE 800 MG PO TABS: 800.0000 mg | ORAL_TABLET | Freq: Three times a day (TID) | ORAL | 3 refills | Status: DC

## 2018-05-13 MED ORDER — "INSULIN SYRINGE-NEEDLE U-100 31G X 15/64"" 0.3 ML MISC": 12 refills | Status: DC

## 2018-05-13 NOTE — Patient Instructions (Addendum)
Thank you for choosing Primary Care at [facility] to be your medical home!    Travis Bryan was seen by Molli Barrows, FNP today.   [name] primary care provider is [name], FNP.   For the best care possible, you should try to see Molli Barrows, FNP-C whenever you come to the clinic.   We look forward to seeing you again soon!  If you have any questions about your visit today, please call us at [phone] or feel free to reach your primary care provider via [place].     I am referring you to Paramedic Team for medical assistance at home.  I have refilled all non heart medications today. Your heart doctor will refill your heart medication.  To refill medication: Contact your pharmacy and your pharmacy will handle all refill request. When calling the pharmacy, please listen to automated message for instructions regarding refilling your medication. To avoid delays, request refills of medication in at least 5 days of completing your last dose.     Eating Plan for Dialysis Dialysis is a treatment that cleans your blood. It is used when your kidneys are damaged. When you need dialysis, you should watch what you eat. This is because some nutrients can build up in your blood between treatments and make you sick. Your doctor or diet specialist (dietitian) will:  Tell you what nutrients you should include or avoid.  Tell you how much of these nutrients you should get each day.  Help you plan meals.  Tell you how much to drink each day. What are tips for following this plan? Reading food labels  Check food labels for: ? Potassium. This is found in milk, fruits, and vegetables. ? Phosphorus. This is found in milk, cheese, beans, nuts, and carbonated beverages. ? Salt (sodium). This is in processed meats, cured meats, ready-made frozen meals, canned vegetables, and salty snack foods.  Try to find foods that are low in potassium, phosphorus, and sodium.  Look  for foods that are labeled "sodium free," "reduced sodium," or "low sodium." Shopping  Do not buy whole-grain and high-fiber foods.  Do not buy or use salt substitutes.  Do not buy processed foods. Cooking  Drain all fluid from cooked vegetables and canned fruits before you eat them.  Before you cook potatoes, cut them into small pieces. Then boil them in unsalted water.  Try using herbs and spices that do not contain sodium to add flavor. Meal planning Most people on dialysis should try to eat:  6-11 servings of grains each day. One serving is equal to 1 slice of bread or  cup of cooked rice or pasta.  2-3 servings of low-potassium vegetables each day. One serving is equal to  cup.  2-3 servings of low-potassium fruits each day. One serving is equal to  cup.  Protein, such as meat, poultry, fish, and eggs. Talk with your doctor or dietitian about the right amount and type of protein to eat.   cup of dairy each day. General information  Follow your doctor's instructions about how much to drink. You may be told to: ? Write down what you drink. ? Write down the foods you eat that are made mostly from water, such as gelatin and soups. ? Drink from small cups.  Take vitamin and mineral supplements only as told by your doctor.  Take over-the-counter and prescription medicines only as told by your doctor. What foods can I eat?     Fruits Apples.  Fresh or frozen berries. Fresh or canned pears, peaches, and pineapple. Grapes. Plums. Vegetables Fresh or frozen broccoli, carrots, and green beans. Cabbage. Cauliflower. Celery. Cucumbers. Eggplant. Radishes. Zucchini. Grains White bread. White rice. Cooked cereal. Unsalted popcorn. Tortillas. Pasta. Meats and other proteins Fresh or frozen beef, pork, chicken, and fish. Eggs. Dairy Cream cheese. Heavy cream. Ricotta cheese. Beverages Apple cider. Cranberry juice. Grape juice. Lemonade. Black coffee. Rice milk (that is not  enriched or fortified). Seasonings and condiments Herbs. Spices. Jam and jelly. Honey. Sweets and desserts Sherbet. Cakes. Cookies. Fats and oils Olive oil, canola oil, and safflower oil. Other foods Non-dairy creamer. Non-dairy whipped topping. Homemade broth without salt. The items listed above may not be a complete list of foods and beverages you can eat. Contact your dietitian for more options. What foods should I avoid? Fruits Star fruit. Bananas. Oranges. Kiwi. Nectarines. Prunes. Melon. Dried fruit. Avocado. Vegetables Potatoes. Beets. Tomatoes. Winter squash and pumpkin. Asparagus. Spinach. Parsnips. Grains Whole-grain bread. Whole-grain pasta. High-fiber cereal. Meats and other proteins Canned, smoked, and cured meats. Packaged lunch meat. Sardines. Nuts and seeds. Peanut butter. Beans and legumes. Dairy Milk. Buttermilk. Yogurt. Cheese and cottage cheese. Processed cheese spreads. Beverages Orange juice. Prune juice. Carbonated soft drinks. Seasonings and condiments Salt. Salt substitutes. Soy sauce. Sweets and desserts Ice cream. Chocolate. Candied nuts. Fats and oils Butter. Margarine. Other foods Ready-made frozen meals. Canned soups. The items listed above may not be a complete list of foods and beverages you should avoid. Contact your dietitian for more information. Summary  If you are having dialysis, it is important to watch what you eat. Certain nutrients and wastes can build up in your blood and cause you to get sick.  Your dietitian will help you make an eating plan that meets your needs.  Avoid foods that are high in potassium, salt (sodium), and phosphorus. Restrict fluids as told by your doctor or dietitian. This information is not intended to replace advice given to you by your health care provider. Make sure you discuss any questions you have with your health care provider. Document Released: 08/29/2011 Document Revised: 05/16/2017 Document Reviewed:  02/28/2017 Elsevier Interactive Patient Education  2019 [employer].

## 2018-05-14 ENCOUNTER — Telehealth: Payer: Self-pay | Admitting: Family Medicine

## 2018-05-14 LAB — CBC WITH DIFFERENTIAL/PLATELET
Basophils Absolute: 0.1 10*3/uL (ref 0.0–0.2)
Basos: 1 %
EOS (ABSOLUTE): 0.5 10*3/uL — ABNORMAL HIGH (ref 0.0–0.4)
EOS: 9 %
HEMATOCRIT: 29.6 % — AB (ref 37.5–51.0)
HEMOGLOBIN: 9.8 g/dL — AB (ref 13.0–17.7)
Immature Grans (Abs): 0 10*3/uL (ref 0.0–0.1)
Immature Granulocytes: 0 %
Lymphocytes Absolute: 1.4 10*3/uL (ref 0.7–3.1)
Lymphs: 29 %
MCH: 29.2 pg (ref 26.6–33.0)
MCHC: 33.1 g/dL (ref 31.5–35.7)
MCV: 88 fL (ref 79–97)
Monocytes Absolute: 0.4 10*3/uL (ref 0.1–0.9)
Monocytes: 8 %
Neutrophils Absolute: 2.6 10*3/uL (ref 1.4–7.0)
Neutrophils: 53 %
Platelets: 323 10*3/uL (ref 150–450)
RBC: 3.36 x10E6/uL — ABNORMAL LOW (ref 4.14–5.80)
RDW: 14.9 % (ref 11.6–15.4)
WBC: 5 10*3/uL (ref 3.4–10.8)

## 2018-05-14 LAB — COMPREHENSIVE METABOLIC PANEL
ALK PHOS: 214 IU/L — AB (ref 39–117)
ALT: 25 IU/L (ref 0–44)
AST: 29 IU/L (ref 0–40)
Albumin/Globulin Ratio: 1 — ABNORMAL LOW (ref 1.2–2.2)
Albumin: 3.5 g/dL — ABNORMAL LOW (ref 4.0–5.0)
BUN/Creatinine Ratio: 3 — ABNORMAL LOW (ref 9–20)
BUN: 29 mg/dL — ABNORMAL HIGH (ref 6–24)
Bilirubin Total: 0.9 mg/dL (ref 0.0–1.2)
CO2: 24 mmol/L (ref 20–29)
CREATININE: 8.46 mg/dL — AB (ref 0.76–1.27)
Calcium: 9.2 mg/dL (ref 8.7–10.2)
Chloride: 96 mmol/L (ref 96–106)
GFR calc Af Amer: 8 mL/min/{1.73_m2} — ABNORMAL LOW (ref >59)
GFR calc non Af Amer: 7 mL/min/{1.73_m2} — ABNORMAL LOW (ref >59)
Globulin, Total: 3.5 g/dL (ref 1.5–4.5)
Glucose: 100 mg/dL — ABNORMAL HIGH (ref 65–99)
Potassium: 3.6 mmol/L (ref 3.5–5.2)
Sodium: 138 mmol/L (ref 134–144)
Total Protein: 7 g/dL (ref 6.0–8.5)

## 2018-05-14 LAB — HEMOGLOBIN A1C
Est. average glucose Bld gHb Est-mCnc: 177 mg/dL
Hgb A1c MFr Bld: 7.8 % — ABNORMAL HIGH (ref 4.8–5.6)

## 2018-05-14 MED FILL — ATORVASTATIN 40 MG TABLET: 40 | 30 days supply | Qty: 30 | Fill #0

## 2018-05-14 MED FILL — SEVELAMER CARBONATE 800 MG: 800 | 30 days supply | Qty: 90 | Fill #0

## 2018-05-14 NOTE — Telephone Encounter (Signed)
Left vm to call back

## 2018-05-14 NOTE — Telephone Encounter (Signed)
Contact patient to advise he can come to office Thursday morning at 8:30 to have sutures and line removed from his neck or if this is inconvenient , he can go to the ER to have removed. The only other day I can fit him in is Monday morning at 8:30

## 2018-05-15 NOTE — Telephone Encounter (Signed)
Contacted patient, stated that he would come in tomorrow at 8:30.

## 2018-05-16 ENCOUNTER — Ambulatory Visit (INDEPENDENT_AMBULATORY_CARE_PROVIDER_SITE_OTHER): Payer: Medicaid Other | Admitting: Family Medicine

## 2018-05-16 VITALS — BP 118/68 | HR 82 | Temp 98.1°F | Resp 96 | Ht 69.5 in | Wt 228.0 lb

## 2018-05-16 DIAGNOSIS — Z4802 Encounter for removal of sutures: Secondary | ICD-10-CM

## 2018-05-16 NOTE — Progress Notes (Signed)
Vitals:   05/16/18 0840  BP: 118/68  Pulse: 82  Resp: (!) 96  Temp: 98.1 F (36.7 C)    Procedure: Removed two sutures from right jugular region and residual sutured tubing remaining from right IJ line that was to have been removed during recent hospitalization.  Patient tolerated procedure. No blood loss occurred. No open wounds noted.     Molli Barrows, FNP Primary Care at [facility] [address] [place] [phone]: [phone]

## 2018-05-17 ENCOUNTER — Ambulatory Visit (INDEPENDENT_AMBULATORY_CARE_PROVIDER_SITE_OTHER): Payer: Medicaid Other | Admitting: *Deleted

## 2018-05-17 DIAGNOSIS — I483 Typical atrial flutter: Secondary | ICD-10-CM

## 2018-05-17 DIAGNOSIS — Z5181 Encounter for therapeutic drug level monitoring: Secondary | ICD-10-CM | POA: Diagnosis not present

## 2018-05-17 DIAGNOSIS — Z95810 Presence of automatic (implantable) cardiac defibrillator: Secondary | ICD-10-CM | POA: Diagnosis not present

## 2018-05-17 DIAGNOSIS — I4819 Other persistent atrial fibrillation: Secondary | ICD-10-CM | POA: Diagnosis not present

## 2018-05-17 LAB — POCT INR: INR: 1.5 — AB (ref 2.0–3.0)

## 2018-05-17 NOTE — Progress Notes (Signed)
Travis Bryan, is a 48 y.o. male  UYQ:[id number]  GLO:[id number]  DOB - 01-Jul-1970  CC:  Chief Complaint  Patient presents with  . Establish Care  . Hospitalization Follow-up    ED->Hosp 2/14-2/24: debility, ileus, chronic systolic heart failure       HPI: Travis Bryan is a 48 y.o. male is here today to establish care and hospital follow-up.  [name] has Type 2 diabetes mellitus ([place]); Hyperlipidemia associated with type 2 diabetes mellitus ([place]); Hypertension associated with diabetes ([place]); CARDIOMYOPATHY, SECONDARY; HFrEF (heart failure with reduced ejection fraction) ([place]); Automatic implantable cardioverter-defibrillator in situ; Persistent atrial fibrillation; Healthcare maintenance; At risk for sexually transmitted disease due to unprotected sex; Type 2 diabetes mellitus with complication, with long-term current use of insulin ([place]); DKA (diabetic ketoacidosis) ([place]); Acute on chronic systolic heart failure, NYHA class 4 ([place]); Hyperlipidemia; Atrial fibrillation ([place]); CAD (coronary artery disease); Elevated troponin; Sepsis due to pneumonia ([place]); AKI (acute kidney injury) ([place]); Acute respiratory failure with hypoxia ([place]); Bilateral pneumonia; Pressure injury of skin; Debility; Hypoxia; Post-operative state; Anemia of chronic disease; PAF (paroxysmal atrial fibrillation) ([place]); and Acute renal failure superimposed on stage 4 chronic kidney disease ([place]) on their problem list.   Travis Bryan was admitted to [facility] services at 03/28/18 for pneumonia with acute respiratory failure.  He continued to have gradually worsening respiratory failure and subsequently experienced CODE BLUE 04/01/18 which was further complicated by pneumonia and New ESRD. He was intubated.  AV fistula placed 04/25/18 and PERM cath to implement HD dialysis.  He also had a history of atrial fibrillation and subsequently, had several runs of atrial fibrillation and was placed on a heparin drip and continued amiodarone. He  was transitioned to oral Warfarin. Once medically stable, he was transferred to [facility] rehabilitation and discharged home 04/26/18. He currently attends HD Tuesdays, Thursday, and Saturdays. On arrival in office, patient has a large dressing in place on left side neck. He reports he was discharged with sutures and partial piece of an IJ lineplaced in neck. He was ordered have in home health, but reports he cursed out the home health nurse that was attempting to schedule an appointment because he's busy and never at home. He reports compliance with medication and brings all bottles to appointment today. He has been seen at the Coumadin clinic since discharge and INR 1.2. denies any heart flutters or palpitations. He was able to verbalized the changes to Warfarin dose. He denies any swelling, dizziness, or new weakness. He suffers from diabetes. Last A1C 13.3 2 months ago. Reports he is now complaint with insulin. He monitors glucose at home. Reports readings have been less than 150. Glucose today 111 and he has eaten today. Denies new headaches, chest pain, abdominal pain, nausea, new weakness , numbness or tingling, SOB, edema, or worrisome cough.   Current medications: Current Outpatient Medications:  .  acetaminophen (TYLENOL) 325 MG tablet, Take 1-2 tablets (325-650 mg total) by mouth every 4 (four) hours as needed for mild pain., Disp: , Rfl:  .  allopurinol (ZYLOPRIM) 100 MG tablet, Take 1 tablet (100 mg total) by mouth daily., Disp: 30 tablet, Rfl: 0 .  amiodarone (PACERONE) 200 MG tablet, Take 1 tablet (200 mg total) by mouth daily., Disp: 30 tablet, Rfl: 0 .  atorvastatin (LIPITOR) 40 MG tablet, Take 1 tablet (40 mg total) by mouth daily at 6 PM., Disp: 90 tablet, Rfl: 2 .  blood glucose meter kit and supplies KIT, Dispense based on patient and insurance preference. Use  up to four times daily as directed. (FOR ICD-9 250.00, 250.01). For QAC - HS accuchecks., Disp: 1 each, Rfl: 1 .  insulin aspart  (NOVOLOG) 100 UNIT/ML injection, Use 3 units three times a day with meals, Disp: 10 mL, Rfl: 3 .  insulin glargine (LANTUS) 100 UNIT/ML injection, Inject 0.22 mLs (22 Units total) into the skin at bedtime., Disp: 10 mL, Rfl: 5 .  Insulin Syringe-Needle U-100 31G X 15/64" 0.3 ML MISC, Use to inject insulin 4 times a day, Disp: 200 each, Rfl: 12 .  lip balm (CARMEX) ointment, Apply topically as needed for lip care., Disp: 7 g, Rfl: 0 .  multivitamin (RENA-VIT) TABS tablet, Take 1 tablet by mouth at bedtime., Disp: 30 tablet, Rfl: 0 .  nystatin (MYCOSTATIN) 100000 UNIT/ML suspension, Take 5 mLs (500,000 Units total) by mouth 4 (four) times daily., Disp: 60 mL, Rfl: 0 .  polyethylene glycol (MIRALAX / GLYCOLAX) packet, Take 17 g by mouth daily., Disp: 30 each, Rfl: 0 .  senna-docusate (SENOKOT-S) 8.6-50 MG tablet, Take 1 tablet by mouth 2 (two) times daily., Disp: 60 tablet, Rfl: 0 .  sevelamer carbonate (RENVELA) 800 MG tablet, Take 1 tablet (800 mg total) by mouth 3 (three) times daily with meals., Disp: 90 tablet, Rfl: 3 .  warfarin (COUMADIN) 5 MG tablet, Take 1 tablet (5 mg total) by mouth daily at 6 PM. Take As Directed by Coumadin Clinic (Patient taking differently: Take 5-7.5 mg by mouth daily at 6 PM. Take 7.5 mg on Mon. Wed. Fri Take 5 mg tues, thurs, sat. sunday), Disp: , Rfl:    Pertinent family medical history: family history includes Diabetes in his mother; Heart disease in his mother; Hypertension in his mother.   Allergies  Allergen Reactions  . No Known Allergies     Social History   Socioeconomic History  . Marital status: Widowed    Spouse name: Not on file  . Number of children: Not on file  . Years of education: Not on file  . Highest education level: Not on file  Occupational History  . Not on file  Social Needs  . Financial resource strain: Not on file  . Food insecurity:    Worry: Not on file    Inability: Not on file  . Transportation needs:    Medical: Not on  file    Non-medical: Not on file  Tobacco Use  . Smoking status: Former Smoker    Years: 10.00    Types: Cigars    Last attempt to quit: 07/28/2002    Years since quitting: 15.8  . Smokeless tobacco: Never Used  Substance and Sexual Activity  . Alcohol use: No    Comment: 03/28/2016 "stopped drinking 4-5 months ago"  . Drug use: No  . Sexual activity: Not Currently  Lifestyle  . Physical activity:    Days per week: Not on file    Minutes per session: Not on file  . Stress: Not on file  Relationships  . Social connections:    Talks on phone: Not on file    Gets together: Not on file    Attends religious service: Not on file    Active member of club or organization: Not on file    Attends meetings of clubs or organizations: Not on file    Relationship status: Not on file  . Intimate partner violence:    Fear of current or ex partner: Not on file    Emotionally abused: Not on file  Physically abused: Not on file    Forced sexual activity: Not on file  Other Topics Concern  . Not on file  Social History Narrative  . Not on file    Review of Systems: Pertinent negatives listed in HPI Objective:   Vitals:   05/13/18 1529  BP: 117/79  Pulse: 82  Resp: 17  SpO2: 93%    BP Readings from Last 3 Encounters:  05/16/18 118/68  05/13/18 117/79  05/04/18 125/61    Filed Weights   05/13/18 1529  Weight: 228 lb (103.4 kg)      Physical Exam: Constitutional: Patient appears without distress and appropriately dressed. HENT: Normocephalic, atraumatic, External right and left ear normal.  Eyes: Conjunctivae and EOM are normal. PERRLA, no scleral icterus. Neck: Normal ROM. Neck supple. No JVD. No tracheal deviation. No thyromegaly. CVS: RRR, S1/S2 +, no murmurs, no gallops, no carotid bruit.  Pulmonary: Effort and breath sounds normal, no stridor, rhonchi, wheezes, rales.  Abdominal: Soft. BS +, no distension, tenderness, rebound or guarding.  Musculoskeletal: Normal  range of motion. Trace edema ankles bilaterally Neuro: Alert. Normal muscle tone coordination. Normal gait.  Skin: Skin is warm and dry. Sutures and line place     Psychiatric: Normal mood and affect. Behavior, judgment, thought content normal.  Lab Results (prior encounters)  Lab Results  Component Value Date   WBC 5.0 05/13/2018   HGB 9.8 (L) 05/13/2018   HCT 29.6 (L) 05/13/2018   MCV 88 05/13/2018   PLT 323 05/13/2018   Lab Results  Component Value Date   CREATININE 8.46 (H) 05/13/2018   BUN 29 (H) 05/13/2018   NA 138 05/13/2018   K 3.6 05/13/2018   CL 96 05/13/2018   CO2 24 05/13/2018    Lab Results  Component Value Date   HGBA1C 7.8 (H) 05/13/2018       Component Value Date/Time   CHOL 243 (H) 09/06/2016 1415   TRIG 288 (H) 04/06/2018 1312   HDL 49 09/06/2016 1415   CHOLHDL 5.0 09/06/2016 1415   CHOLHDL 5.0 Ratio 04/13/2009 1945   VLDL 21 04/13/2009 1945   LDLCALC 167 (H) 09/06/2016 1415        Assessment and plan:  1. Encounter to establish care  2. Chronic systolic heart failure (HCC) Defibrillator in place. Continue follow-up cardiology. Asymptomatic today. Repeating: - Comprehensive metabolic panel - CBC with Differential  3. Type 2 diabetes mellitus with stage 5 chronic kidney disease not on chronic dialysis, with long-term current use of insulin (HCC) Current regimen include: Lantus 22 units once daly at bedtime and 3 units three times daily with meals. Previously on metformin-d/c due to ESRD - Glucose (CBG) 111 - Hemoglobin A1c, repeating today  4. Hyperlipidemia associated with type 2 diabetes mellitus (HCC) - atorvastatin (LIPITOR) 40 MG tablet; Take 1 tablet (40 mg total) by mouth daily at 6 PM.  Dispense: 90 tablet; Refill: 2  5. PAF (paroxysmal atrial fibrillation) (HCC) Anticoagulated and followed Coumadin clinic  Continue amiodarone Continue follow-up with cardiology   Patient has a IJ line in place from recent  hospitalization. Should have been removed during inpatient stay.  Will research chart a little bit further as patient has slowly and will advise him to return to office to have line and suturing remove this week.  Given complexity of patient's health condition and low health literacy, patient would benefit from paramedicine program. He is uninsured, therefore will forward a requested to our in house social worker to determine  if patient would qualify. Patient would also benefit from assistance applying for disability.   Return in about 2 weeks (around 05/27/2018).   The patient was given clear instructions to go to ER or return to medical center if symptoms don't improve, worsen or new problems develop. The patient verbalized understanding. The patient was advised  to call and obtain lab results if they haven't heard anything from out office within 7-10 business days.  Molli Barrows, FNP Primary Care at [facility] [address] [place] [address] [phone]: [phone]   This note has been created with Dragon speech recognition software and sEngineer, materials Any transcriptional errors are unintentional.

## 2018-05-17 NOTE — Patient Instructions (Signed)
Description   Take 2 tablets today, then take 1.5 tablets tomorrow, then start taking 1.5 tablets daily except 1  tablet on Tuesdays, Thursday, and Saturdays.  Recheck in 1 week.  Coumadin Clinic [phone]. Do 2 servings of greens a week

## 2018-05-19 ENCOUNTER — Telehealth: Payer: Self-pay | Admitting: Family Medicine

## 2018-05-19 NOTE — Telephone Encounter (Signed)
Opal Sidles,  I would like to refer this patient to paramedicine program. He could benefit from help with medication and daily weights given history of CHF. He suffers from new onset dialysis and HD days are Tuesday, Thursday, and Saturdays. He is a diabetic with a previous A1C of 13.3 prior to impatient stay. If you need any additional information to qualify patient, please let me know. He may also benefit from assistance with applying for disability. He reports some assisted him with a disability application during his recent hospitalization. He was uncertain of his current status.  Thanks,  Molli Barrows, FNP

## 2018-05-20 ENCOUNTER — Telehealth: Payer: Self-pay | Admitting: Family Medicine

## 2018-05-20 NOTE — Telephone Encounter (Signed)
Advise patient allow 7-14 days for his request to be completed.

## 2018-05-20 NOTE — Telephone Encounter (Signed)
Patient called requesting a letter form his PCP stating that he is not able to work due to his condition. Patient needs this letter to take to [employer]. Please f/u

## 2018-05-20 NOTE — Telephone Encounter (Signed)
Please advise 

## 2018-05-21 NOTE — Telephone Encounter (Signed)
Called patient, informed him of 7-14 days.

## 2018-05-22 ENCOUNTER — Encounter: Payer: Self-pay | Attending: Physical Medicine & Rehabilitation | Admitting: Physical Medicine & Rehabilitation

## 2018-05-24 ENCOUNTER — Other Ambulatory Visit (HOSPITAL_COMMUNITY): Payer: Self-pay | Admitting: *Deleted

## 2018-05-24 ENCOUNTER — Other Ambulatory Visit: Payer: Self-pay

## 2018-05-24 ENCOUNTER — Encounter: Payer: Self-pay | Admitting: Family Medicine

## 2018-05-24 ENCOUNTER — Ambulatory Visit (INDEPENDENT_AMBULATORY_CARE_PROVIDER_SITE_OTHER): Payer: Medicaid Other | Admitting: Pharmacist

## 2018-05-24 DIAGNOSIS — Z95810 Presence of automatic (implantable) cardiac defibrillator: Secondary | ICD-10-CM | POA: Diagnosis not present

## 2018-05-24 DIAGNOSIS — Z5181 Encounter for therapeutic drug level monitoring: Secondary | ICD-10-CM | POA: Diagnosis not present

## 2018-05-24 DIAGNOSIS — I483 Typical atrial flutter: Secondary | ICD-10-CM | POA: Diagnosis not present

## 2018-05-24 DIAGNOSIS — I4819 Other persistent atrial fibrillation: Secondary | ICD-10-CM

## 2018-05-24 LAB — POCT INR: INR: 2.1 (ref 2.0–3.0)

## 2018-05-24 MED ORDER — WARFARIN SODIUM 5 MG PO TABS: ORAL_TABLET | ORAL | 3 refills | Status: DC

## 2018-05-24 MED FILL — WARFARIN SODIUM 5 MG TABLET: 5 | 30 days supply | Qty: 45 | Fill #0

## 2018-05-24 NOTE — Telephone Encounter (Signed)
Letter complete. Notify patient, letter is ready for pick-up,

## 2018-05-24 NOTE — Patient Instructions (Signed)
Description   Continue 1.5 tablets daily except 1  tablet on Tuesdays, Thursday, and Saturdays.  Recheck in 2 week.  Coumadin Clinic [phone]. Do 2 servings of greens a week

## 2018-05-24 NOTE — Telephone Encounter (Signed)
Notified patient.

## 2018-05-29 ENCOUNTER — Other Ambulatory Visit: Payer: Self-pay

## 2018-05-29 ENCOUNTER — Encounter: Payer: Self-pay | Admitting: Family Medicine

## 2018-05-29 ENCOUNTER — Ambulatory Visit (INDEPENDENT_AMBULATORY_CARE_PROVIDER_SITE_OTHER): Payer: Medicaid Other | Admitting: Family Medicine

## 2018-05-29 VITALS — BP 110/75 | HR 89 | Temp 98.4°F | Resp 17 | Ht 69.5 in | Wt 218.2 lb

## 2018-05-29 DIAGNOSIS — E785 Hyperlipidemia, unspecified: Secondary | ICD-10-CM

## 2018-05-29 DIAGNOSIS — Z794 Long term (current) use of insulin: Secondary | ICD-10-CM

## 2018-05-29 DIAGNOSIS — I5022 Chronic systolic (congestive) heart failure: Secondary | ICD-10-CM

## 2018-05-29 DIAGNOSIS — E1122 Type 2 diabetes mellitus with diabetic chronic kidney disease: Secondary | ICD-10-CM

## 2018-05-29 DIAGNOSIS — I5023 Acute on chronic systolic (congestive) heart failure: Secondary | ICD-10-CM

## 2018-05-29 DIAGNOSIS — I480 Paroxysmal atrial fibrillation: Secondary | ICD-10-CM

## 2018-05-29 DIAGNOSIS — I10 Essential (primary) hypertension: Secondary | ICD-10-CM

## 2018-05-29 DIAGNOSIS — Z0279 Encounter for issue of other medical certificate: Secondary | ICD-10-CM

## 2018-05-29 DIAGNOSIS — N185 Chronic kidney disease, stage 5: Secondary | ICD-10-CM | POA: Diagnosis not present

## 2018-05-29 DIAGNOSIS — Z992 Dependence on renal dialysis: Secondary | ICD-10-CM

## 2018-05-29 LAB — GLUCOSE, POCT (MANUAL RESULT ENTRY): POC Glucose: 166 mg/dl — AB (ref 70–99)

## 2018-05-29 NOTE — Telephone Encounter (Signed)
Patient is in office right now & requesting a refill on his Pacerone 200 mg tablet.

## 2018-05-29 NOTE — Progress Notes (Signed)
Patient ID: Patryck Kilgore, male    DOB: [birth year], 48 y.o.   MRN: [id number]  PCP: [name], FNP  Chief Complaint  Patient presents with  . Diabetes  . Medication management    Subjective:  HPI  Osinachi Navarrette is a 48 y.o. male presents for evaluation diabetes and medication management.  [place] medical history is significant has Type 2 diabetes mellitus ([place]); Hyperlipidemia associated with type 2 diabetes mellitus ([place]); Hypertension associated with diabetes ([place]); CARDIOMYOPATHY, SECONDARY; HFrEF (heart failure with reduced ejection fraction) ([place]); Automatic implantable cardioverter-defibrillator in situ; Persistent atrial fibrillation; Healthcare maintenance;Anemia of chronic disease; PAF (paroxysmal atrial fibrillation) ([place]); ESRD.  Patient is not checking his blood sugar routinely and admits to missing doses of insulin. He is newly diagnosed with ESRD. Attends HD three times weekly T, TH, Sat. He is anuric. Glucose today 166. He denies any neuropathic pain. Last A1C 7.8. Patient was asked today with all of his medications to review. He brings all medications today with the exception of his insulin and glucometer. He denies chest pain, shortness of breath, dizziness, or new weakness.  Social History   Socioeconomic History  . Marital status: Widowed    Spouse name: Not on file  . Number of children: Not on file  . Years of education: Not on file  . Highest education level: Not on file  Occupational History  . Not on file  Social Needs  . Financial resource strain: Not on file  . Food insecurity:    Worry: Not on file    Inability: Not on file  . Transportation needs:    Medical: Not on file    Non-medical: Not on file  Tobacco Use  . Smoking status: Former Smoker    Years: 10.00    Types: Cigars    Last attempt to quit: 07/28/2002    Years since quitting: 15.8  . Smokeless tobacco: Never Used  Substance and Sexual Activity  . Alcohol use: No    Comment:  03/28/2016 "stopped drinking 4-5 months ago"  . Drug use: No  . Sexual activity: Not Currently  Lifestyle  . Physical activity:    Days per week: Not on file    Minutes per session: Not on file  . Stress: Not on file  Relationships  . Social connections:    Talks on phone: Not on file    Gets together: Not on file    Attends religious service: Not on file    Active member of club or organization: Not on file    Attends meetings of clubs or organizations: Not on file    Relationship status: Not on file  . Intimate partner violence:    Fear of current or ex partner: Not on file    Emotionally abused: Not on file    Physically abused: Not on file    Forced sexual activity: Not on file  Other Topics Concern  . Not on file  Social History Narrative  . Not on file    Family History  Problem Relation Age of Onset  . Hypertension Mother   . Heart disease Mother   . Diabetes Mother    Review of Systems Pertinent negatives listed in HPI Patient Active Problem List   Diagnosis Date Noted  . Acute renal failure superimposed on stage 4 chronic kidney disease ([place]) 05/01/2018  . Debility 04/26/2018  . Hypoxia   . Post-operative state   . Anemia of chronic disease   . PAF (paroxysmal atrial  fibrillation) ([place])   . Pressure injury of skin 04/17/2018  . Bilateral pneumonia 03/29/2018  . Acute respiratory failure with hypoxia ([place]) 03/28/2018  . DKA (diabetic ketoacidosis) ([place]) 02/25/2018  . Acute on chronic systolic heart failure, NYHA class 4 ([place]) 02/25/2018  . Hyperlipidemia 02/25/2018  . Atrial fibrillation ([place]) 02/25/2018  . CAD (coronary artery disease) 02/25/2018  . Elevated troponin 02/25/2018  . Sepsis due to pneumonia ([place]) 02/25/2018  . AKI (acute kidney injury) ([place]) 02/25/2018  . Type 2 diabetes mellitus with complication, with long-term current use of insulin ([place]) 12/26/2017  . At risk for sexually transmitted disease due to unprotected sex 08/23/2017  .  Healthcare maintenance 06/24/2016  . Persistent atrial fibrillation   . CARDIOMYOPATHY, SECONDARY 07/03/2008  . HFrEF (heart failure with reduced ejection fraction) ([place]) 07/03/2008  . Automatic implantable cardioverter-defibrillator in situ 07/03/2008  . Type 2 diabetes mellitus ([place]) 12/26/2005  . Hyperlipidemia associated with type 2 diabetes mellitus ([place]) 12/26/2005  . Hypertension associated with diabetes ([place]) 12/26/2005    Allergies  Allergen Reactions  . No Known Allergies     Prior to Admission medications   Medication Sig Start Date End Date Taking? Authorizing Provider  acetaminophen (TYLENOL) 325 MG tablet Take 1-2 tablets (325-650 mg total) by mouth every 4 (four) hours as needed for mild pain. 05/01/18  Yes Love, Ivan Anchors, PA-C  allopurinol (ZYLOPRIM) 100 MG tablet Take 1 tablet (100 mg total) by mouth daily. 05/04/18  Yes Love, Ivan Anchors, PA-C  amiodarone (PACERONE) 200 MG tablet Take 1 tablet (200 mg total) by mouth daily. 05/03/18  Yes Love, Ivan Anchors, PA-C  atorvastatin (LIPITOR) 40 MG tablet Take 1 tablet (40 mg total) by mouth daily at 6 PM. 05/13/18  Yes [name], FNP  insulin aspart (NOVOLOG) 100 UNIT/ML injection Use 3 units three times a day with meals 05/13/18 05/13/19 Yes [name], FNP  insulin glargine (LANTUS) 100 UNIT/ML injection Inject 0.22 mLs (22 Units total) into the skin at bedtime. 05/13/18  Yes [name], FNP  Insulin Syringe-Needle U-100 31G X 15/64" 0.3 ML MISC Use to inject insulin 4 times a day 05/13/18  Yes [name], FNP  multivitamin (RENA-VIT) TABS tablet Take 1 tablet by mouth at bedtime. 05/03/18  Yes Love, Ivan Anchors, PA-C  sevelamer carbonate (RENVELA) 800 MG tablet Take 1 tablet (800 mg total) by mouth 3 (three) times daily with meals. 05/13/18  Yes [name], FNP  warfarin (COUMADIN) 5 MG tablet Take As Directed by Coumadin Clinic 05/24/18  Yes Larey Dresser, MD    Past Medical, Surgical Family and Social  History reviewed and updated.    Objective:   Today's Vitals   05/29/18 1440  BP: 110/75  Pulse: 89  Resp: 17  Temp: 98.4 F (36.9 C)  TempSrc: Oral  SpO2: 95%  Weight: 218 lb 3.2 oz (99 kg)  Height: 5' 9.5" (1.765 m)    Wt Readings from Last 3 Encounters:  05/29/18 218 lb 3.2 oz (99 kg)  05/16/18 228 lb (103.4 kg)  05/13/18 228 lb (103.4 kg)   Physical Exam General appearance: alert, well developed, well nourished, cooperative and in no distress Head: Normocephalic, without obvious abnormality, atraumatic Respiratory: Respirations even and unlabored, normal respiratory rate Heart: rate and rhythm normal. No gallop or murmurs noted on exam  Abdomen: BS +, no distention, no rebound tenderness, or no mass Extremities: No gross deformities Skin: Skin color, texture, turgor normal. No rashes seen  Psych: Appropriate mood and affect. Neurologic: Mental status: Alert, oriented to person, place, and time, thought content appropriate. Lab Results  Component Value Date   POCGLU 166 (A) 05/29/2018   POCGLU 111 (A) 05/13/2018    Lab Results  Component Value Date   HGBA1C 7.8 (H) 05/13/2018   Assessment & Plan:  1. Type 2 diabetes mellitus with stage 5 chronic kidney disease not on chronic dialysis, with long-term current use of insulin (HCC) Reinforced the importance of compliance with medication and checking blood sugar.  Compliance with dialysis. Glucose (CBG)-166  2. Hyperlipidemia, unspecified hyperlipidemia type -Currently prescribed statin therapy, continue Atorvastatin 40 mg  - Lipid panel  3. Essential hypertension We have discussed target BP range and blood pressure goal. I have advised patient to check BP regularly and to call us back or report to clinic if the numbers are consistently higher than 140/90. We discussed the importance of compliance with medical therapy and DASH diet recommended, consequences of uncontrolled hypertension discussed.  -Continue current  BP medications  4. PAF (paroxysmal atrial fibrillation) ([place]) -Continue close follow-up with cardiology and anti-coagulant therapy through coumadin clinic  5. Acute on chronic systolic heart failure, NYHA class 4 ([place]) -Continue follow-up cardiology     -The patient was given clear instructions to go to ER or return to medical center if symptoms do not improve, worsen or new problems develop. The patient verbalized understanding.    Molli Barrows, FNP Primary Care at [facility] [address], [place] [phone]: [phone]

## 2018-05-30 LAB — LIPID PANEL
CHOL/HDL RATIO: 3.8 ratio (ref 0.0–5.0)
Cholesterol, Total: 168 mg/dL (ref 100–199)
HDL: 44 mg/dL (ref >39)
LDL Calculated: 83 mg/dL (ref 0–99)
Triglycerides: 206 mg/dL — ABNORMAL HIGH (ref 0–149)
VLDL Cholesterol Cal: 41 mg/dL — ABNORMAL HIGH (ref 5–40)

## 2018-05-30 MED ORDER — AMIODARONE HCL 200 MG PO TABS: 200.0000 mg | ORAL_TABLET | Freq: Every day | ORAL | 0 refills | Status: DC

## 2018-05-30 MED FILL — AMIODARONE HCL 200 MG TAB: 200 | 30 days supply | Qty: 30 | Fill #0

## 2018-05-30 NOTE — Telephone Encounter (Signed)
Rx Written at discharge from hospital. All refills to be filled by primary MD or cardiology.

## 2018-06-03 ENCOUNTER — Other Ambulatory Visit: Payer: Self-pay | Admitting: Physical Medicine and Rehabilitation

## 2018-06-03 MED FILL — RENA-VITE TABLET: 30 days supply | Qty: 30 | Fill #0

## 2018-06-05 ENCOUNTER — Telehealth: Payer: Self-pay

## 2018-06-05 NOTE — Telephone Encounter (Signed)

## 2018-06-07 ENCOUNTER — Telehealth (HOSPITAL_COMMUNITY): Payer: Self-pay | Admitting: Rehabilitation

## 2018-06-07 ENCOUNTER — Ambulatory Visit (INDEPENDENT_AMBULATORY_CARE_PROVIDER_SITE_OTHER): Payer: Medicaid Other | Admitting: *Deleted

## 2018-06-07 DIAGNOSIS — I483 Typical atrial flutter: Secondary | ICD-10-CM | POA: Diagnosis not present

## 2018-06-07 DIAGNOSIS — Z95810 Presence of automatic (implantable) cardiac defibrillator: Secondary | ICD-10-CM | POA: Diagnosis not present

## 2018-06-07 DIAGNOSIS — Z5181 Encounter for therapeutic drug level monitoring: Secondary | ICD-10-CM | POA: Diagnosis not present

## 2018-06-07 DIAGNOSIS — I4819 Other persistent atrial fibrillation: Secondary | ICD-10-CM | POA: Diagnosis not present

## 2018-06-07 LAB — POCT INR: INR: 2.2 (ref 2.0–3.0)

## 2018-06-07 NOTE — Patient Instructions (Signed)
Description   Continue taking 1.5 tablets daily except 1 tablet on Tuesdays, Thursday, and Saturdays.  Recheck in 4 weeks. Coumadin Clinic [phone]. Do 2 servings of greens a week

## 2018-06-07 NOTE — Telephone Encounter (Signed)
The above patient or their representative was contacted and gave the following answers to these questions:         Do you have any of the following symptoms? No  Fever                    Cough                   Shortness of breath  Do  you have any of the following other symptoms? No   muscle pain         vomiting,        diarrhea        rash         weakness        red eye        abdominal pain         bruising          bruising or bleeding              joint pain           severe headache    Have you been in contact with someone who was or has been sick in the past 2 weeks? No  Yes                 Unsure                         Unable to assess   Does the person that you were in contact with have any of the following symptoms?   Cough         shortness of breath           muscle pain         vomiting,            diarrhea            rash            weakness           fever            red eye           abdominal pain           bruising  or  bleeding                joint pain                severe headache               Have you  or someone you have been in contact with traveled internationally in th last month? No        If yes, which countries?   Have you  or someone you have been in contact with traveled outside [place] in th last month? No         If yes, which state and city?   COMMENTS OR ACTION PLAN FOR THIS PATIENT:          

## 2018-06-10 ENCOUNTER — Ambulatory Visit (INDEPENDENT_AMBULATORY_CARE_PROVIDER_SITE_OTHER): Payer: Self-pay | Admitting: Family

## 2018-06-10 ENCOUNTER — Other Ambulatory Visit: Payer: Self-pay

## 2018-06-10 ENCOUNTER — Encounter: Payer: Self-pay | Admitting: Family

## 2018-06-10 ENCOUNTER — Encounter (HOSPITAL_COMMUNITY): Payer: Self-pay

## 2018-06-10 ENCOUNTER — Ambulatory Visit (HOSPITAL_COMMUNITY)
Admission: RE | Admit: 2018-06-10 | Discharge: 2018-06-10 | Disposition: A | Discharge status: Home / Self Care | Payer: Medicaid Other | Source: Ambulatory Visit | Attending: Family | Admitting: Family

## 2018-06-10 VITALS — BP 115/76 | HR 75 | Temp 98.1°F | Resp 18 | Ht 72.0 in | Wt 225.0 lb

## 2018-06-10 DIAGNOSIS — N186 End stage renal disease: Secondary | ICD-10-CM

## 2018-06-10 DIAGNOSIS — Z992 Dependence on renal dialysis: Secondary | ICD-10-CM

## 2018-06-10 DIAGNOSIS — I770 Arteriovenous fistula, acquired: Secondary | ICD-10-CM

## 2018-06-10 NOTE — Progress Notes (Signed)
CC: Evaluation after creation of first stage left basilic vein fistula     History of Present Illness  Travis Bryan is a 48 y.o. (05/13/1970) male who is s/p removal of left internal jugular vein tunneled dialysis catheter and first stage left basilic vein fistula creation on 04-25-18 by Dr. Trula Slade. Cephalic vein did not distend adequately for fistula creation and so the basilic vein was used.   He returns today for follow up dialysis access duplex.   Pt denies any steal type symptoms in his left upper extremity.   He currently dialyzes via right IJ TDC on T-TH-S at [facility] on [address] in [place].   He quit smoking in 2004, smoked x 20 years.  He has DM, takes insulin.   He takes coumadin for atrial fib.  He has an AICD.    Past Medical History:  Diagnosis Date  . AICD (automatic cardioverter/defibrillator) present   . Atrial flutter ([place])    a. s/p ablation 03/2016  . Chronic systolic CHF (congestive heart failure) ([place])   . HCAP (healthcare-associated pneumonia) 03/28/2018  . History of gout   . Hyperlipidemia   . Hypertension   . Myocardial infarction [facility])    "I think I had one a long long time ago" (03/28/2016)  . NICM (nonischemic cardiomyopathy) ([place])    a. LHC 6/06: pLAD 20, pLCx 20-30; b. Echo 5/15:  EF 15%, diffuse HK, restrictive physiology, trivial AI, trivial MR, mild LAE, moderate RVE, moderately reduced RVSF, moderate RAE, mild to moderate TR, PASP 43 mmHg  . Obesity   . Persistent atrial fibrillation   . Type II diabetes mellitus ([place])     Social History Social History   Tobacco Use  . Smoking status: Former Smoker    Years: 10.00    Types: Cigars    Last attempt to quit: 07/28/2002    Years since quitting: 15.8  . Smokeless tobacco: Never Used  Substance Use Topics  . Alcohol use: No    Comment: 03/28/2016 "stopped drinking 4-5 months ago"  . Drug use: No    Family History Family History  Problem Relation Age of Onset  .  Hypertension Mother   . Heart disease Mother   . Diabetes Mother     Surgical History Past Surgical History:  Procedure Laterality Date  . AV FISTULA PLACEMENT Left 04/25/2018   Procedure: ARTERIOVENOUS (AV) VS GRAFT ARM;  Surgeon: Serafina Mitchell, MD;  Location: [place];  Service: Vascular;  Laterality: Left;  . CARDIOVERSION N/A 04/07/2016   Procedure: CARDIOVERSION;  Surgeon: Thayer Headings, MD;  Location[facility] ENDOSCOPY;  Service: Cardiovascular;  Laterality: N/A;  . CARDIOVERSION N/A 04/11/2016   Procedure: CARDIOVERSION;  Surgeon: Larey Dresser, MD;  Location: [place];  Service: Cardiovascular;  Laterality: N/A;  . ELECTROPHYSIOLOGIC STUDY N/A 03/31/2016   Procedure: A-Flutter Ablation;  Surgeon: Evans Lance, MD;  Location: [place] CV LAB;  Service: Cardiovascular;  Laterality: N/A;  . EXCHANGE OF A DIALYSIS CATHETER Left 04/25/2018   Procedure: Attempted Exchange Of A Dialysis Catheter on the Left side;  Surgeon: Serafina Mitchell, MD;  Location: [facility] OR;  Service: Vascular;  Laterality: Left;  . IMPLANTABLE CARDIOVERTER DEFIBRILLATOR (ICD) GENERATOR CHANGE N/A 08/27/2013   Procedure: ICD GENERATOR CHANGE;  Surgeon: Evans Lance, MD;  Location[facility] CATH LAB;  Service: Cardiovascular;  Laterality: N/A;  . INSERTION OF DIALYSIS CATHETER Right 04/25/2018   Procedure: INSERTION OF TUNNELED DIALYSIS CATHETER;  Surgeon: Harold Barban  W, MD;  Location: [place] OR;  Service: Vascular;  Laterality: Right;  . TEE WITHOUT CARDIOVERSION N/A 03/31/2016   Procedure: TRANSESOPHAGEAL ECHOCARDIOGRAM (TEE);  Surgeon: Larey Dresser, MD;  Location: [place];  Service: Cardiovascular;  Laterality: N/A;    Allergies  Allergen Reactions  . No Known Allergies     Current Outpatient Medications  Medication Sig Dispense Refill  . allopurinol (ZYLOPRIM) 100 MG tablet Take 1 tablet (100 mg total) by mouth daily. 30 tablet 0  . amiodarone (PACERONE) 200 MG tablet Take 1 tablet (200 mg total) by mouth  daily. 30 tablet 0  . atorvastatin (LIPITOR) 40 MG tablet Take 1 tablet (40 mg total) by mouth daily at 6 PM. 90 tablet 2  . insulin aspart (NOVOLOG) 100 UNIT/ML injection Use 3 units three times a day with meals 10 mL 3  . insulin glargine (LANTUS) 100 UNIT/ML injection Inject 0.22 mLs (22 Units total) into the skin at bedtime. 10 mL 5  . Insulin Syringe-Needle U-100 31G X 15/64" 0.3 ML MISC Use to inject insulin 4 times a day 200 each 12  . multivitamin (RENA-VIT) TABS tablet TAKE 1 TABLET BY MOUTH AT BEDTIME. 30 tablet 0  . sevelamer carbonate (RENVELA) 800 MG tablet Take 1 tablet (800 mg total) by mouth 3 (three) times daily with meals. 90 tablet 3  . warfarin (COUMADIN) 5 MG tablet Take As Directed by Coumadin Clinic 45 tablet 3  . acetaminophen (TYLENOL) 325 MG tablet Take 1-2 tablets (325-650 mg total) by mouth every 4 (four) hours as needed for mild pain. (Patient not taking: Reported on 06/10/2018)     No current facility-administered medications for this visit.      REVIEW OF SYSTEMS: see HPI for pertinent positives and negatives    PHYSICAL EXAMINATION:  Vitals:   06/10/18 1223  BP: 115/76  Pulse: 75  Resp: 18  Temp: 98.1 F (36.7 C)  TempSrc: Oral  SpO2: 95%  Weight: 225 lb (102.1 kg)  Height: 6' (1.829 m)   Body mass index is 30.52 kg/m.  General: Obese male appears his stated age.   HEENT:  No gross abnormalities Pulmonary: Respirations are non-labored, good air movement in all fields, no rales, rhonchi, or wheezes.  Abdomen: Soft and non-tender with normal bowel sounds.  Musculoskeletal: There are no major deformities.   Neurologic: No focal weakness or paresthesias are detected, M/S 5/5 in all extremities.  Skin: There are no ulcer or rashes noted. Psychiatric: The patient has normal affect. Cardiovascular: There is a regular rate and rhythm without significant murmur appreciated. AICD palpated left upper chest.  Bilateral DP pulses are 1-2+ palpable.   Tunneled dialysis catheter present at right IJ.  Bilateral radial pulses are 2+ palpable.  There is an audible bruit at the distal and medial portions of the left upper arm AVF; there is a palpable thrill at the medial and distal portions of the AVF.   Non-Invasive Vascular Imaging  Left arm Access Duplex  (Date: 06/10/2018):  Findings: +--------------------+----------+-----------------+--------+ AVF                 PSV (cm/s)Flow Vol (mL/min)Comments +--------------------+----------+-----------------+--------+ Native artery inflow   295          1563                +--------------------+----------+-----------------+--------+ AVF Anastomosis        672                              +--------------------+----------+-----------------+--------+   +----------------+----------+-------------+----------+-------------------------+  OUTFLOW VEIN    PSV (cm/s)Diameter (cm)Depth (cm)        Describe          +----------------+----------+-------------+----------+-------------------------+ Confluence         435        0.66        0.87          joins deep         +----------------+----------+-------------+----------+-------------------------+ Mid UA             369        0.50        0.65    distal to mid upper arm                                                            fistula          +----------------+----------+-------------+----------+-------------------------+ Mid to distal UA   419        0.46        1.03    distal to mid upper arm                                                            fistula          +----------------+----------+-------------+----------+-------------------------+ Dist UA            539        0.46        1.03                             +----------------+----------+-------------+----------+-------------------------+   Summary: Patent left Basilic vein transposition. Increased velocity in the anastamosis in the  distal upper arm.   Medical Decision Making  Travis Bryan is a 48 y.o. male who is s/p removal of left internal jugular vein tunneled dialysis catheter and first stage left basilic vein fistula creation on 04-25-18.   I discussed AV fistula duplex results, HPI, and pertinent physical exam results with Dr. Trula Slade. Will schedule for 2nd stage basilic vein transposition.  Pt dialyzes via right IJ TDC.    Clemon Chambers, RN, MSN, FNP-C [facility] Office: [phone]  06/10/2018, 12:51 PM  Clinic MD: Trula Slade

## 2018-06-12 ENCOUNTER — Ambulatory Visit (INDEPENDENT_AMBULATORY_CARE_PROVIDER_SITE_OTHER): Payer: Medicaid Other | Admitting: Podiatry

## 2018-06-12 ENCOUNTER — Encounter: Payer: Self-pay | Admitting: Podiatry

## 2018-06-12 ENCOUNTER — Other Ambulatory Visit: Payer: Self-pay

## 2018-06-12 VITALS — BP 128/73 | HR 100 | Temp 97.8°F | Resp 16

## 2018-06-12 DIAGNOSIS — E119 Type 2 diabetes mellitus without complications: Secondary | ICD-10-CM | POA: Diagnosis not present

## 2018-06-12 DIAGNOSIS — M79674 Pain in right toe(s): Secondary | ICD-10-CM | POA: Diagnosis not present

## 2018-06-12 DIAGNOSIS — D689 Coagulation defect, unspecified: Secondary | ICD-10-CM

## 2018-06-12 DIAGNOSIS — L608 Other nail disorders: Secondary | ICD-10-CM | POA: Diagnosis not present

## 2018-06-12 DIAGNOSIS — B351 Tinea unguium: Secondary | ICD-10-CM | POA: Diagnosis not present

## 2018-06-12 DIAGNOSIS — M79675 Pain in left toe(s): Secondary | ICD-10-CM

## 2018-06-12 NOTE — Progress Notes (Signed)
This patient presents to the office with chief complaint of long thick nails and diabetic feet.  This patient  says there  is  no pain and discomfort in his feet.  This patient says there are long thick painful nails.  These nails are painful walking and wearing shoes.  Patient has no history of infection or drainage from both feet.  Patient is unable to  self treat his own nails . This patient presents  to the office today for treatment of the  long nails and a foot evaluation due to history of  Diabetes. Patient was referred to this office by his dialysis doctor.  General Appearance  Alert, conversant and in no acute stress.  Vascular  Dorsalis pedis and posterior tibial  pulses are palpable  bilaterally.  Capillary return is within normal limits  bilaterally. Temperature is within normal limits  bilaterally.  Neurologic  Senn-Weinstein monofilament wire test within normal limits  bilaterally. Muscle power within normal limits bilaterally.  Nails Thick disfigured discolored nails with subungual debris  from hallux to fifth toes bilaterally. No evidence of bacterial infection or drainage bilaterally. Pincer nails  B/L  Orthopedic  No limitations of motion of motion feet .  No crepitus or effusions noted.  No bony pathology or digital deformities noted. Mild dorsomedial exostosis with FHL.  Skin  normotropic skin with no porokeratosis noted bilaterally.  No signs of infections or ulcers noted.     Onychomycosis  Diabetes with no foot complications  IE  Debride nails x 10.  A diabetic foot exam was performed and there is no evidence of any vascular or neurologic pathology.   RTC 3 months.   Gardiner Barefoot DPM

## 2018-06-19 ENCOUNTER — Ambulatory Visit (INDEPENDENT_AMBULATORY_CARE_PROVIDER_SITE_OTHER): Payer: Medicaid Other | Admitting: *Deleted

## 2018-06-19 ENCOUNTER — Other Ambulatory Visit: Payer: Self-pay

## 2018-06-19 ENCOUNTER — Telehealth: Payer: Self-pay | Admitting: *Deleted

## 2018-06-19 ENCOUNTER — Telehealth: Payer: Self-pay

## 2018-06-19 DIAGNOSIS — I428 Other cardiomyopathies: Secondary | ICD-10-CM

## 2018-06-19 DIAGNOSIS — I5022 Chronic systolic (congestive) heart failure: Secondary | ICD-10-CM

## 2018-06-19 LAB — CUP PACEART REMOTE DEVICE CHECK
Battery Remaining Longevity: 65 mo
Battery Remaining Percentage: 61 %
Battery Voltage: 2.95 V
Brady Statistic RV Percent Paced: 1 %
Date Time Interrogation Session: 20200408154608
HighPow Impedance: 52 Ohm
HighPow Impedance: 52 Ohm
Implantable Lead Implant Date: 20090211
Implantable Lead Location: 753860
Implantable Lead Model: 7121
Implantable Pulse Generator Implant Date: 20150617
Lead Channel Impedance Value: 330 Ohm
Lead Channel Pacing Threshold Amplitude: 1 V
Lead Channel Pacing Threshold Pulse Width: 0.5 ms
Lead Channel Sensing Intrinsic Amplitude: 9.9 mV
Lead Channel Setting Pacing Amplitude: 2.5 V
Lead Channel Setting Pacing Pulse Width: 0.5 ms
Lead Channel Setting Sensing Sensitivity: 0.5 mV
Pulse Gen Serial Number: 7200640

## 2018-06-19 NOTE — Telephone Encounter (Signed)
Call to patient to schedule surgery. Patient stated he does not get up early and refused to schedule with arrival at hospital at 7 am. Spoke with Elmyra Ricks at [facility] for staff to talk with patient.

## 2018-06-19 NOTE — Telephone Encounter (Signed)
Spoke with patient to remind of missed remote transmission 

## 2018-06-19 NOTE — Telephone Encounter (Signed)
   Primary Cardiologist: Loralie Champagne, MD  Chart reviewed as part of pre-operative protocol coverage. Requesting office has messaged for guidance on holding coumadin for procedure.  Per our pharmacy staff: Patient with diagnosis of Afib on warfarin for anticoagulation.    Procedure: Second stage basilic vein transportation left arm Date of procedure: pending  CHADS2-VASc score of  4 (CHF, HTN, AGE, DM2, stroke/tia x 2, CAD, AGE, male)  Per office protocol, patient can hold warfarin for 3 days prior to procedure as pre request.   I will route this recommendation to the requesting party via [place] fax function and remove from pre-op pool.  Please call with questions.  Ledora Bottcher, PA 06/19/2018, 12:32 PM

## 2018-06-19 NOTE — Telephone Encounter (Signed)
Patient with diagnosis of Afib on warfarin for anticoagulation.    Procedure: Second stage basilic vein transportation left arm Date of procedure: pending  CHADS2-VASc score of  4 (CHF, HTN, AGE, DM2, stroke/tia x 2, CAD, AGE, male)  Per office protocol, patient can hold warfarin for 3 days prior to procedure as pre request.

## 2018-06-19 NOTE — Telephone Encounter (Signed)
Request for Surgical Clearance  1. What type of surgery is being performed?  Second Stage Basilic Vein Transposition left arm.    2. When is this surgery scheduled? Pending  3. What type of clearance is required (medical clearance vs. Pharmacy clearance to hold med vs. Both)? PHARMACY CLEARANCE FOR MEDICATION HOLD    4. Are there any medications that need to be held prior to surgery and how long?  3 days pre-op    5. Practice name and name of physician performing surgery?   [facility]  Dr. Trula Slade    6.  What is your office phone number? [phone]    7. What is your office fax number? (Be sure to include anyone who it needs to go Attn to) [phone] attn. Becky    8. Anesthesia type (None, local, MAC, general)? MAC   REMINDER TO USER: Remember to please route this message to P CV DIV PREOP in a phone note.

## 2018-06-26 ENCOUNTER — Other Ambulatory Visit: Payer: Self-pay | Admitting: *Deleted

## 2018-06-26 ENCOUNTER — Telehealth: Payer: Self-pay | Admitting: Internal Medicine

## 2018-06-26 ENCOUNTER — Encounter: Payer: Self-pay | Admitting: *Deleted

## 2018-06-26 NOTE — Telephone Encounter (Signed)
New message   Called patient to schedule past due appt with Dr. Lovena Le. Offered patient 04.21.20 but pt states he has dialysis on Tuesday and Thursday. Pt scheduled for a doxemity visit on May 1st with Dr. Lovena Le. Pt has an android telephone. Verbal Consent Obtained.

## 2018-06-26 NOTE — Progress Notes (Signed)
Call from [place] at [place].After doctor spoke with patient at treatment today, instructed to proceed with scheduling second stage BVT for patient 07/19/2018. Fax  Instruction letter to [facility] and they will review and give to patient. See letter.

## 2018-06-28 ENCOUNTER — Other Ambulatory Visit: Payer: Self-pay | Admitting: Family Medicine

## 2018-06-28 MED FILL — WARFARIN SODIUM 5 MG TABLET: 5 | 30 days supply | Qty: 45 | Fill #1

## 2018-06-28 MED FILL — ATORVASTATIN 40 MG TABLET: 40 | 30 days supply | Qty: 30 | Fill #1

## 2018-06-28 MED FILL — AMIODARONE HCL 200 MG TAB: 200 | 30 days supply | Qty: 30 | Fill #0

## 2018-06-28 NOTE — Telephone Encounter (Signed)
   Requested Prescriptions  Pending Prescriptions Disp Refills   multivitamin (RENA-VIT) TABS tablet [Pharmacy Med Name: RENA-VITE TABLET TAB] 30 tablet 0    Sig: TAKE 1 TABLET BY MOUTH AT BEDTIME.     There is no refill protocol information for this order

## 2018-06-28 NOTE — Progress Notes (Signed)
Remote ICD transmission.   

## 2018-07-01 MED FILL — RENA-VITE TABLET: 30 days supply | Qty: 30 | Fill #0

## 2018-07-03 ENCOUNTER — Telehealth: Payer: Self-pay

## 2018-07-03 NOTE — Telephone Encounter (Signed)

## 2018-07-12 ENCOUNTER — Ambulatory Visit (INDEPENDENT_AMBULATORY_CARE_PROVIDER_SITE_OTHER): Payer: Medicaid Other | Admitting: *Deleted

## 2018-07-12 ENCOUNTER — Telehealth (INDEPENDENT_AMBULATORY_CARE_PROVIDER_SITE_OTHER): Payer: Medicare Other | Admitting: Internal Medicine

## 2018-07-12 ENCOUNTER — Other Ambulatory Visit: Payer: Self-pay

## 2018-07-12 DIAGNOSIS — I483 Typical atrial flutter: Secondary | ICD-10-CM

## 2018-07-12 DIAGNOSIS — I5022 Chronic systolic (congestive) heart failure: Secondary | ICD-10-CM | POA: Diagnosis not present

## 2018-07-12 DIAGNOSIS — Z5181 Encounter for therapeutic drug level monitoring: Secondary | ICD-10-CM

## 2018-07-12 DIAGNOSIS — Z95810 Presence of automatic (implantable) cardiac defibrillator: Secondary | ICD-10-CM

## 2018-07-12 DIAGNOSIS — I4819 Other persistent atrial fibrillation: Secondary | ICD-10-CM

## 2018-07-12 DIAGNOSIS — I428 Other cardiomyopathies: Secondary | ICD-10-CM | POA: Diagnosis not present

## 2018-07-12 LAB — POCT INR: INR: 2.5 (ref 2.0–3.0)

## 2018-07-12 NOTE — Progress Notes (Signed)
Electrophysiology TeleHealth Note   Due to national recommendations of social distancing due to COVID 19, an audio/video telehealth visit is felt to be most appropriate for this patient at this time.  See MyChart message from today for the patient's consent to telehealth for [facility].   Date:  07/12/2018   ID:  Travis Bryan, DOB [birth year], MRN [id number]  Location: patient's home  Provider location: [address] [place]  Evaluation Performed: Follow-up visit  PCP:  [name], FNP  Cardiologist:  Loralie Champagne, MD  Electrophysiologist:  Dr Lovena Le  Chief Complaint:  "I don't like this dialysis"  History of Present Illness:    Travis Bryan is a 48 y.o. male who presents via audio/video conferencing for a telehealth visit today. He has a long standing non-ischemic CM, chronic systolic heart failure, HTN, and ESRD on HD. He has a h/o atrial flutter s/p ablation, VT with ICD therapies in the past and chronic amiodarone therapy.  Since last being seen in our clinic, the patient reports doing very well.  Today, he denies symptoms of palpitations, chest pain, shortness of breath,  lower extremity edema, dizziness, presyncope, or syncope.  The patient is otherwise without complaint today.  The patient denies symptoms of fevers, chills, cough, or new SOB worrisome for COVID 19.  Past Medical History:  Diagnosis Date   AICD (automatic cardioverter/defibrillator) present    Atrial flutter ([place])    a. s/p ablation 08/2834   Chronic systolic CHF (congestive heart failure) ([place])    HCAP (healthcare-associated pneumonia) 03/28/2018   History of gout    Hyperlipidemia    Hypertension    Myocardial infarction ([place])    "I think I had one a long long time ago" (03/28/2016)   NICM (nonischemic cardiomyopathy) ([place])    a. LHC 6/06: pLAD 20, pLCx 20-30; b. Echo 5/15:  EF 15%, diffuse HK, restrictive physiology, trivial AI, trivial MR, mild LAE, moderate RVE,  moderately reduced RVSF, moderate RAE, mild to moderate TR, PASP 43 mmHg   Obesity    Persistent atrial fibrillation    Type II diabetes mellitus ([place])     Past Surgical History:  Procedure Laterality Date   AV FISTULA PLACEMENT Left 04/25/2018   Procedure: ARTERIOVENOUS (AV) VS GRAFT ARM;  Surgeon: Serafina Mitchell, MD;  Location: [place];  Service: Vascular;  Laterality: Left;   CARDIOVERSION N/A 04/07/2016   Procedure: CARDIOVERSION;  Surgeon: Thayer Headings, MD;  Location: [facility] ENDOSCOPY;  Service: Cardiovascular;  Laterality: N/A;   CARDIOVERSION N/A 04/11/2016   Procedure: CARDIOVERSION;  Surgeon: Larey Dresser, MD;  Location: [place];  Service: Cardiovascular;  Laterality: N/A;   ELECTROPHYSIOLOGIC STUDY N/A 03/31/2016   Procedure: A-Flutter Ablation;  Surgeon: Evans Lance, MD;  Location: [place] CV LAB;  Service: Cardiovascular;  Laterality: N/A;   EXCHANGE OF A DIALYSIS CATHETER Left 04/25/2018   Procedure: Attempted Exchange Of A Dialysis Catheter on the Left side;  Surgeon: Serafina Mitchell, MD;  Location: [place];  Service: Vascular;  Laterality: Left;   IMPLANTABLE CARDIOVERTER DEFIBRILLATOR (ICD) GENERATOR CHANGE N/A 08/27/2013   Procedure: ICD GENERATOR CHANGE;  Surgeon: Evans Lance, MD;  [facility] CATH LAB;  Service: Cardiovascular;  Laterality: N/A;   INSERTION OF DIALYSIS CATHETER Right 04/25/2018   Procedure: INSERTION OF TUNNELED DIALYSIS CATHETER;  Surgeon: Serafina Mitchell, MD;  [facility];  Service: Vascular;  Laterality: Right;   TEE WITHOUT CARDIOVERSION N/A 03/31/2016  Procedure: TRANSESOPHAGEAL ECHOCARDIOGRAM (TEE);  Surgeon: Larey Dresser, MD;  Location: [facility] ENDOSCOPY;  Service: Cardiovascular;  Laterality: N/A;    Current Outpatient Medications  Medication Sig Dispense Refill   acetaminophen (TYLENOL) 325 MG tablet Take 1-2 tablets (325-650 mg total) by mouth every 4 (four) hours as needed for mild pain. (Patient not taking: Reported  on 06/10/2018)     allopurinol (ZYLOPRIM) 100 MG tablet Take 1 tablet (100 mg total) by mouth daily. 30 tablet 0   amiodarone (PACERONE) 200 MG tablet Take 1 tablet (200 mg total) by mouth daily. 30 tablet 0   atorvastatin (LIPITOR) 40 MG tablet Take 1 tablet (40 mg total) by mouth daily at 6 PM. 90 tablet 2   insulin aspart (NOVOLOG) 100 UNIT/ML injection Use 3 units three times a day with meals 10 mL 3   insulin glargine (LANTUS) 100 UNIT/ML injection Inject 0.22 mLs (22 Units total) into the skin at bedtime. 10 mL 5   Insulin Syringe-Needle U-100 31G X 15/64" 0.3 ML MISC Use to inject insulin 4 times a day 200 each 12   multivitamin (RENA-VIT) TABS tablet TAKE 1 TABLET BY MOUTH AT BEDTIME. 30 tablet 0   sevelamer carbonate (RENVELA) 800 MG tablet Take 1 tablet (800 mg total) by mouth 3 (three) times daily with meals. 90 tablet 3   warfarin (COUMADIN) 5 MG tablet Take As Directed by Coumadin Clinic 45 tablet 3   No current facility-administered medications for this visit.     Allergies:   No known allergies   Social History:  The patient  reports that he quit smoking about 15 years ago. His smoking use included cigars. He quit after 10.00 years of use. He has never used smokeless tobacco. He reports that he does not drink alcohol or use drugs.   Family History:  The patient's  family history includes Diabetes in his mother; Heart disease in his mother; Hypertension in his mother.   ROS:  Please see the history of present illness.   All other systems are personally reviewed and negative.    Exam:    Vital Signs:  There were no vitals taken for this visit.  Well appearing, alert and conversant, regular work of breathing,  good skin color Eyes- anicteric, neuro- grossly intact, skin- no apparent rash or lesions or cyanosis, mouth- oral mucosa is pink   Labs/Other Tests and Data Reviewed:    Recent Labs: 03/28/2018: B Natriuretic Peptide 683.2 04/18/2018: Magnesium  3.6 05/13/2018: ALT 25; BUN 29; Creatinine, Ser 8.46; Hemoglobin 9.8; Platelets 323; Potassium 3.6; Sodium 138   Wt Readings from Last 3 Encounters:  06/10/18 225 lb (102.1 kg)  05/29/18 218 lb 3.2 oz (99 kg)  05/16/18 228 lb (103.4 kg)     Other studies personally reviewed: Last device remote is reviewed from [place] PDF dated 06/19/18 which reveals normal device function, no arrhythmias except for a single episode of NSVT   ASSESSMENT & PLAN:    1.  VF/VT - the patient has done well on amiodarone 200 mg daily. Continue. His arrhythmias are much improved. 2. Chronic systolic heart failure - he has been better with regard to his volume status since being on HD.  3. Atrial fib - he is maintaining NSR on amio and will continue warfarin. 4. COVID 19 screen The patient denies symptoms of COVID 19 at this time.  The importance of social distancing was discussed today.  Follow-up:  4/21 Next remote: 7/20  Current medicines are reviewed  at length with the patient today.   The patient does not have concerns regarding his medicines.  The following changes were made today:  none  Labs/ tests ordered today include:  No orders of the defined types were placed in this encounter.    Patient Risk:  after full review of this patients clinical status, I feel that they are at moderate risk at this time.  Today, I have spent 15 minutes with the patient with telehealth technology discussing all of the above.   The video portion of the visit could not be captured as the patient is without a compatible phone.  Signed, Cristopher Peru, MD  07/12/2018 10:04 AM     [facility] [address] [place] [address] [phone] (office) [phone] (fax)

## 2018-07-17 ENCOUNTER — Other Ambulatory Visit: Payer: Self-pay

## 2018-07-17 ENCOUNTER — Encounter (HOSPITAL_COMMUNITY): Payer: Self-pay | Admitting: *Deleted

## 2018-07-17 NOTE — Progress Notes (Signed)
Pt denies SOB and chest pain. Pt stated that he is under the care of DR. Cristopher Peru. Peri-op prescription for ICD initiated; awaiting response. Nurse was unable to assess pt for additional cardiac studies such as cardiac cath and stress test (please assess on DOS) due to pt wanting to hurry to complete assessment. Pt stated that last dose of Coumadin was Monday as instructed. Pt made aware to stop taking  vitamins, fish oil and herbal medications. Do not take any NSAIDs ie: Ibuprofen, Advil, Naproxen (Aleve), Motrin, BC and Goody Powder. Pt made aware to take 11 units of Lantus insulin Thursday night and no insulin on DOS. Pt stated that he does not check his blood glucose.  Pt denies that he and family members tested positive for COVID -84.   Coronavirus Screening  Pt denies that he and family members experienced the following symptoms:  Cough yes/no: No Fever (>100.50F)  yes/no: No Runny nose yes/no: No Sore throat yes/no: No Difficulty breathing/shortness of breath  yes/no: No  Have you or a family member traveled in the last 14 days and where? yes/no: No Pt reminded that hospital visitation restrictions are in effect and the importance of the restrictions.  Pt verbalized understanding of all pre-op instructions. PA, Anesthesiology, asked to review pt history and recent cardiac note.

## 2018-07-18 NOTE — Anesthesia Preprocedure Evaluation (Addendum)
Anesthesia Evaluation  Patient identified by MRN, date of birth, ID band Patient awake    Reviewed: Allergy & Precautions, NPO status , Patient's Chart, lab work & pertinent test results  Airway Mallampati: II  TM Distance: >3 FB Neck ROM: Full    Dental no notable dental hx.    Pulmonary neg pulmonary ROS,    Pulmonary exam normal breath sounds clear to auscultation       Cardiovascular hypertension, Pt. on medications + Past MI and +CHF  Normal cardiovascular exam+ Cardiac Defibrillator  Rhythm:Regular Rate:Normal     Neuro/Psych CVA negative psych ROS   GI/Hepatic negative GI ROS, Neg liver ROS,   Endo/Other  negative endocrine ROSdiabetes  Renal/GU ESRF and DialysisRenal disease  negative genitourinary   Musculoskeletal negative musculoskeletal ROS (+)   Abdominal   Peds negative pediatric ROS (+)  Hematology negative hematology ROS (+)   Anesthesia Other Findings   Reproductive/Obstetrics negative OB ROS                            Anesthesia Physical Anesthesia Plan  ASA: IV  Anesthesia Plan: General   Post-op Pain Management:    Induction: Intravenous  PONV Risk Score and Plan: 2 and Ondansetron, Midazolam and Treatment may vary due to age or medical condition  Airway Management Planned: LMA  Additional Equipment:   Intra-op Plan:   Post-operative Plan: Extubation in OR  Informed Consent: I have reviewed the patients History and Physical, chart, labs and discussed the procedure including the risks, benefits and alternatives for the proposed anesthesia with the patient or authorized representative who has indicated his/her understanding and acceptance.     Dental advisory given  Plan Discussed with: CRNA  Anesthesia Plan Comments: (Follows with cardiology for non-ischemic CM, chronic systolic heart failure, HTN. He has a h/o atrial flutter s/p ablation, VT with  ICD therapies in the past and chronic amiodarone therapy. Telehealth visit 07/12/18 with Dr. Cristopher Peru. Per note, "Patient Risk: after full review of this patients clinical status, I feel that they are at moderate risk at this time."  04/01/2018: Pt arrested in the setting of PNA/respiratory failure.  Echo 02/27/2018: - Left ventricle: Diffuse hypokinesis worse in the inferior wall.   The cavity size was moderately dilated. Wall thickness was   increased in a pattern of mild LVH. Systolic function was   severely reduced. The estimated ejection fraction was in the   range of 25% to 30%. Doppler parameters are consistent with both   elevated ventricular end-diastolic filling pressure and elevated   left atrial filling pressure. - Left atrium: The atrium was mildly dilated. - Atrial septum: No defect or patent foramen ovale was identified.)     Anesthesia Quick Evaluation

## 2018-07-19 ENCOUNTER — Telehealth: Payer: Self-pay | Admitting: *Deleted

## 2018-07-19 ENCOUNTER — Other Ambulatory Visit: Payer: Self-pay

## 2018-07-19 ENCOUNTER — Encounter (HOSPITAL_COMMUNITY)
Admission: RE | Disposition: A | Discharge status: Home / Self Care | Payer: Self-pay | Source: Home / Self Care | Attending: Surgery

## 2018-07-19 ENCOUNTER — Ambulatory Visit (HOSPITAL_COMMUNITY)
Admission: RE | Admit: 2018-07-19 | Discharge: 2018-07-19 | Disposition: A | Discharge status: Home / Self Care | Payer: Medicare Other | Attending: Surgery | Admitting: Surgery

## 2018-07-19 ENCOUNTER — Ambulatory Visit (HOSPITAL_COMMUNITY): Payer: Medicare Other | Admitting: Physician Assistant

## 2018-07-19 ENCOUNTER — Other Ambulatory Visit: Payer: Self-pay | Admitting: Physician Assistant

## 2018-07-19 ENCOUNTER — Telehealth: Payer: Self-pay | Admitting: Surgery

## 2018-07-19 DIAGNOSIS — N186 End stage renal disease: Secondary | ICD-10-CM | POA: Insufficient documentation

## 2018-07-19 DIAGNOSIS — Z992 Dependence on renal dialysis: Secondary | ICD-10-CM | POA: Insufficient documentation

## 2018-07-19 DIAGNOSIS — Z8249 Family history of ischemic heart disease and other diseases of the circulatory system: Secondary | ICD-10-CM | POA: Insufficient documentation

## 2018-07-19 DIAGNOSIS — I132 Hypertensive heart and chronic kidney disease with heart failure and with stage 5 chronic kidney disease, or end stage renal disease: Secondary | ICD-10-CM | POA: Diagnosis not present

## 2018-07-19 DIAGNOSIS — E1122 Type 2 diabetes mellitus with diabetic chronic kidney disease: Secondary | ICD-10-CM | POA: Diagnosis not present

## 2018-07-19 DIAGNOSIS — I252 Old myocardial infarction: Secondary | ICD-10-CM | POA: Diagnosis not present

## 2018-07-19 DIAGNOSIS — E669 Obesity, unspecified: Secondary | ICD-10-CM | POA: Diagnosis not present

## 2018-07-19 DIAGNOSIS — I5022 Chronic systolic (congestive) heart failure: Secondary | ICD-10-CM | POA: Insufficient documentation

## 2018-07-19 DIAGNOSIS — Z794 Long term (current) use of insulin: Secondary | ICD-10-CM | POA: Insufficient documentation

## 2018-07-19 DIAGNOSIS — M109 Gout, unspecified: Secondary | ICD-10-CM | POA: Diagnosis not present

## 2018-07-19 DIAGNOSIS — Z95810 Presence of automatic (implantable) cardiac defibrillator: Secondary | ICD-10-CM | POA: Insufficient documentation

## 2018-07-19 DIAGNOSIS — I4892 Unspecified atrial flutter: Secondary | ICD-10-CM | POA: Diagnosis not present

## 2018-07-19 DIAGNOSIS — I428 Other cardiomyopathies: Secondary | ICD-10-CM | POA: Diagnosis not present

## 2018-07-19 DIAGNOSIS — Z833 Family history of diabetes mellitus: Secondary | ICD-10-CM | POA: Insufficient documentation

## 2018-07-19 DIAGNOSIS — I4819 Other persistent atrial fibrillation: Secondary | ICD-10-CM | POA: Insufficient documentation

## 2018-07-19 DIAGNOSIS — Z79899 Other long term (current) drug therapy: Secondary | ICD-10-CM | POA: Insufficient documentation

## 2018-07-19 DIAGNOSIS — Z7901 Long term (current) use of anticoagulants: Secondary | ICD-10-CM | POA: Diagnosis not present

## 2018-07-19 DIAGNOSIS — E785 Hyperlipidemia, unspecified: Secondary | ICD-10-CM | POA: Diagnosis not present

## 2018-07-19 DIAGNOSIS — Z87891 Personal history of nicotine dependence: Secondary | ICD-10-CM | POA: Diagnosis not present

## 2018-07-19 DIAGNOSIS — Z6829 Body mass index (BMI) 29.0-29.9, adult: Secondary | ICD-10-CM | POA: Insufficient documentation

## 2018-07-19 HISTORY — DX: Chronic kidney disease, unspecified: N18.9

## 2018-07-19 HISTORY — DX: Cerebral infarction, unspecified: I63.9

## 2018-07-19 HISTORY — PX: BASCILIC VEIN TRANSPOSITION: SHX5742

## 2018-07-19 LAB — PROTIME-INR
INR: 1.5 — ABNORMAL HIGH (ref 0.8–1.2)
Prothrombin Time: 18.1 seconds — ABNORMAL HIGH (ref 11.4–15.2)

## 2018-07-19 LAB — POCT I-STAT 4, (NA,K, GLUC, HGB,HCT)
Glucose, Bld: 136 mg/dL — ABNORMAL HIGH (ref 70–99)
HCT: 37 % — ABNORMAL LOW (ref 39.0–52.0)
Hemoglobin: 12.6 g/dL — ABNORMAL LOW (ref 13.0–17.0)
Potassium: 4.2 mmol/L (ref 3.5–5.1)
Sodium: 136 mmol/L (ref 135–145)

## 2018-07-19 LAB — GLUCOSE, CAPILLARY
Glucose-Capillary: 133 mg/dL — ABNORMAL HIGH (ref 70–99)
Glucose-Capillary: 127 mg/dL — ABNORMAL HIGH (ref 70–99)

## 2018-07-19 SURGERY — TRANSPOSITION, VEIN, BASILIC
Anesthesia: General | Laterality: Left

## 2018-07-19 MED ORDER — HYDROMORPHONE HCL 1 MG/ML IJ SOLN: 0.2500 mg | INTRAMUSCULAR | Status: DC | PRN

## 2018-07-19 MED ORDER — CEFAZOLIN SODIUM-DEXTROSE 2-4 GM/100ML-% IV SOLN
2.0000 g | INTRAVENOUS | Status: AC
  Administered 2018-07-19: 2 g via INTRAVENOUS
  Filled 2018-07-19: qty 100

## 2018-07-19 MED ORDER — CHLORHEXIDINE GLUCONATE 4 % EX LIQD: 60.0000 mL | Freq: Once | CUTANEOUS | Status: DC

## 2018-07-19 MED ORDER — LIDOCAINE 2% (20 MG/ML) 5 ML SYRINGE
INTRAMUSCULAR | Status: AC
  Filled 2018-07-19: qty 5

## 2018-07-19 MED ORDER — PROPOFOL 10 MG/ML IV BOLUS
INTRAVENOUS | Status: DC | PRN
  Administered 2018-07-19: 150 mg via INTRAVENOUS

## 2018-07-19 MED ORDER — 0.9 % SODIUM CHLORIDE (POUR BTL) OPTIME
TOPICAL | Status: DC | PRN
  Administered 2018-07-19: 1000 mL

## 2018-07-19 MED ORDER — MIDAZOLAM HCL 5 MG/5ML IJ SOLN
INTRAMUSCULAR | Status: DC | PRN
  Administered 2018-07-19: 2 mg via INTRAVENOUS

## 2018-07-19 MED ORDER — HYDROCODONE-ACETAMINOPHEN 5-325 MG PO TABS: 1.0000 | ORAL_TABLET | Freq: Four times a day (QID) | ORAL | 0 refills | Status: DC | PRN

## 2018-07-19 MED ORDER — OXYCODONE HCL 5 MG/5ML PO SOLN: 5.0000 mg | Freq: Once | ORAL | Status: AC | PRN

## 2018-07-19 MED ORDER — LACTATED RINGERS IV SOLN: INTRAVENOUS | Status: DC

## 2018-07-19 MED ORDER — LIDOCAINE 2% (20 MG/ML) 5 ML SYRINGE
INTRAMUSCULAR | Status: DC | PRN
  Administered 2018-07-19: 60 mg via INTRAVENOUS

## 2018-07-19 MED ORDER — SODIUM CHLORIDE 0.9 % IV SOLN
INTRAVENOUS | Status: DC
  Administered 2018-07-19 (×2): via INTRAVENOUS

## 2018-07-19 MED ORDER — DEXAMETHASONE SODIUM PHOSPHATE 10 MG/ML IJ SOLN
INTRAMUSCULAR | Status: AC
  Filled 2018-07-19: qty 1

## 2018-07-19 MED ORDER — HYDROMORPHONE HCL 1 MG/ML IJ SOLN
INTRAMUSCULAR | Status: AC
  Filled 2018-07-19: qty 1

## 2018-07-19 MED ORDER — PHENYLEPHRINE 40 MCG/ML (10ML) SYRINGE FOR IV PUSH (FOR BLOOD PRESSURE SUPPORT)
PREFILLED_SYRINGE | INTRAVENOUS | Status: AC
  Filled 2018-07-19: qty 10

## 2018-07-19 MED ORDER — FENTANYL CITRATE (PF) 250 MCG/5ML IJ SOLN
INTRAMUSCULAR | Status: AC
  Filled 2018-07-19: qty 5

## 2018-07-19 MED ORDER — MIDAZOLAM HCL 2 MG/2ML IJ SOLN
INTRAMUSCULAR | Status: AC
  Filled 2018-07-19: qty 2

## 2018-07-19 MED ORDER — PHENYLEPHRINE HCL (PRESSORS) 10 MG/ML IV SOLN
INTRAVENOUS | Status: DC | PRN
  Administered 2018-07-19: 80 ug via INTRAVENOUS
  Administered 2018-07-19: 120 ug via INTRAVENOUS

## 2018-07-19 MED ORDER — LIDOCAINE-EPINEPHRINE (PF) 1 %-1:200000 IJ SOLN
INTRAMUSCULAR | Status: AC
  Filled 2018-07-19: qty 30

## 2018-07-19 MED ORDER — PROMETHAZINE HCL 25 MG/ML IJ SOLN: 6.2500 mg | INTRAMUSCULAR | Status: DC | PRN

## 2018-07-19 MED ORDER — ONDANSETRON HCL 4 MG/2ML IJ SOLN
INTRAMUSCULAR | Status: AC
  Filled 2018-07-19: qty 4

## 2018-07-19 MED ORDER — FENTANYL CITRATE (PF) 100 MCG/2ML IJ SOLN
INTRAMUSCULAR | Status: DC | PRN
  Administered 2018-07-19 (×6): 25 ug via INTRAVENOUS

## 2018-07-19 MED ORDER — OXYCODONE HCL 5 MG PO TABS
ORAL_TABLET | ORAL | Status: AC
  Filled 2018-07-19: qty 1

## 2018-07-19 MED ORDER — OXYCODONE HCL 5 MG PO TABS
5.0000 mg | ORAL_TABLET | Freq: Once | ORAL | Status: AC | PRN
  Administered 2018-07-19: 5 mg via ORAL

## 2018-07-19 MED ORDER — ONDANSETRON HCL 4 MG/2ML IJ SOLN
INTRAMUSCULAR | Status: DC | PRN
  Administered 2018-07-19: 4 mg via INTRAVENOUS

## 2018-07-19 MED ORDER — SODIUM CHLORIDE 0.9 % IV SOLN
INTRAVENOUS | Status: AC
  Filled 2018-07-19: qty 1.2

## 2018-07-19 MED ORDER — SODIUM CHLORIDE 0.9 % IV SOLN
INTRAVENOUS | Status: DC | PRN
  Administered 2018-07-19: 500 mL

## 2018-07-19 MED ORDER — DEXAMETHASONE SODIUM PHOSPHATE 10 MG/ML IJ SOLN
INTRAMUSCULAR | Status: DC | PRN
  Administered 2018-07-19: 5 mg via INTRAVENOUS

## 2018-07-19 MED ORDER — HEMOSTATIC AGENTS (NO CHARGE) OPTIME
TOPICAL | Status: DC | PRN
  Administered 2018-07-19: 1 via TOPICAL

## 2018-07-19 SURGICAL SUPPLY — 38 items
ADH SKN CLS APL DERMABOND .7 (GAUZE/BANDAGES/DRESSINGS) ×1
ARMBAND PINK RESTRICT EXTREMIT (MISCELLANEOUS) ×2 IMPLANT
BANDAGE ELASTIC 4 VELCRO ST LF (GAUZE/BANDAGES/DRESSINGS) ×2 IMPLANT
CANISTER SUCT 3000ML PPV (MISCELLANEOUS) ×2 IMPLANT
CLIP VESOCCLUDE MED 24/CT (CLIP) IMPLANT
CLIP VESOCCLUDE MED 6/CT (CLIP) IMPLANT
CLIP VESOCCLUDE SM WIDE 24/CT (CLIP) ×1 IMPLANT
CLIP VESOCCLUDE SM WIDE 6/CT (CLIP) IMPLANT
COVER PROBE W GEL 5X96 (DRAPES) ×2 IMPLANT
COVER WAND RF STERILE (DRAPES) ×2 IMPLANT
DERMABOND ADVANCED (GAUZE/BANDAGES/DRESSINGS) ×1
DERMABOND ADVANCED .7 DNX12 (GAUZE/BANDAGES/DRESSINGS) ×1 IMPLANT
ELECT REM PT RETURN 9FT ADLT (ELECTROSURGICAL) ×2
ELECTRODE REM PT RTRN 9FT ADLT (ELECTROSURGICAL) ×1 IMPLANT
GLOVE BIOGEL PI IND STRL 7.5 (GLOVE) ×1 IMPLANT
GLOVE BIOGEL PI INDICATOR 7.5 (GLOVE) ×1
GLOVE SURG SS PI 7.5 STRL IVOR (GLOVE) ×2 IMPLANT
GOWN STRL REUS W/ TWL LRG LVL3 (GOWN DISPOSABLE) ×2 IMPLANT
GOWN STRL REUS W/ TWL XL LVL3 (GOWN DISPOSABLE) ×1 IMPLANT
GOWN STRL REUS W/TWL LRG LVL3 (GOWN DISPOSABLE) ×4
GOWN STRL REUS W/TWL XL LVL3 (GOWN DISPOSABLE) ×2
HEMOSTAT SNOW SURGICEL 2X4 (HEMOSTASIS) IMPLANT
KIT BASIN OR (CUSTOM PROCEDURE TRAY) ×2 IMPLANT
KIT TURNOVER KIT B (KITS) ×2 IMPLANT
NS IRRIG 1000ML POUR BTL (IV SOLUTION) ×2 IMPLANT
PACK CV ACCESS (CUSTOM PROCEDURE TRAY) ×2 IMPLANT
PAD ARMBOARD 7.5X6 YLW CONV (MISCELLANEOUS) ×4 IMPLANT
SUT PROLENE 6 0 BV (SUTURE) ×2 IMPLANT
SUT PROLENE 6 0 CC (SUTURE) ×2 IMPLANT
SUT SILK 2 0 SH (SUTURE) IMPLANT
SUT SILK 3 0 (SUTURE) ×2
SUT SILK 3-0 18XBRD TIE 12 (SUTURE) IMPLANT
SUT VIC AB 3-0 SH 27 (SUTURE) ×4
SUT VIC AB 3-0 SH 27X BRD (SUTURE) ×1 IMPLANT
SUT VICRYL 4-0 PS2 18IN ABS (SUTURE) ×3 IMPLANT
TOWEL GREEN STERILE (TOWEL DISPOSABLE) ×2 IMPLANT
UNDERPAD 30X30 (UNDERPADS AND DIAPERS) ×2 IMPLANT
WATER STERILE IRR 1000ML POUR (IV SOLUTION) ×2 IMPLANT

## 2018-07-19 NOTE — Op Note (Signed)
    Patient name: Travis Bryan MRN: [id number] DOB: 1970-08-24 Sex: male  07/19/2018 Pre-operative Diagnosis: ESRD Post-operative diagnosis:  Same Surgeon:  Annamarie Major Assistants:  Arlee Muslim Procedure:   Second stage left basilic vein fistula creation Anesthesia: General Blood Loss: Minimal Specimens: None  Findings: Excellent appearing basilic vein  Indications: The patient originally had a for staged left basilic vein fistula.  He does have a defibrillator on the left side but it was felt that left-sided access was preferred.  He comes in today for second stage  Procedure:  The patient was identified in the holding area and taken to [place] 10  The patient was then placed supine on the table. general anesthesia was administered.  The patient was prepped and draped in the usual sterile fashion.  A time out was called and antibiotics were administered.  Ultrasound was used to map the course of the basilic vein in the upper arm.  Appeared to be healthy.  2 longitudinal incisions were made on the medial upper arm.  Through these incisions the basilic vein was fully mobilized and multiple side branches were ligated.  Once it had been fully mobilized, it was marked for orientation.  The nerve was protected.  A subcutaneous tunnel was then created.  The vein was ligated near the arterial venous anastomosis.  It was brought through the tunnel.  No heparin was given.  A end-to-end anastomosis was created with 6-0 Prolene.  Once this was completed blood flow was reestablished to the fistula.  There was an excellent thrill through the vein.  Doppler signals at the wrist were present.  The wound was then irrigated.  The incisions were closed with 2 layers of 3-0 Vicryl followed by Dermabond.  A loose Ace wrap was placed over the wound.   Disposition: To PACU stable   V. Annamarie Major, M.D., [facility] [place] Office: [phone] Pager:  [phone]

## 2018-07-19 NOTE — Progress Notes (Signed)
Pain medicine ordered; primary pharmacy unable to fill prescription

## 2018-07-19 NOTE — Transfer of Care (Signed)
Immediate Anesthesia Transfer of Care Note  Patient: Travis Bryan  Procedure(s) Performed: SECOND STAGE BASILIC VEIN TRANSPOSITION LEFT ARM (Left )  Patient Location: PACU  Anesthesia Type:General  Level of Consciousness: patient cooperative and responds to stimulation  Airway & Oxygen Therapy: Patient Spontanous Breathing and Patient connected to nasal cannula oxygen  Post-op Assessment: Report given to RN and Post -op Vital signs reviewed and stable  Post vital signs: Reviewed and stable  Last Vitals:  Vitals Value Taken Time  BP 145/86 07/19/2018 11:43 AM  Temp    Pulse 80 07/19/2018 11:46 AM  Resp 26 07/19/2018 11:46 AM  SpO2 100 % 07/19/2018 11:46 AM  Vitals shown include unvalidated device data.  Last Pain:  Vitals:   07/19/18 0751  TempSrc:   PainSc: 0-No pain         Complications: No apparent anesthesia complications

## 2018-07-19 NOTE — Anesthesia Postprocedure Evaluation (Signed)
Anesthesia Post Note  Patient: Travis Bryan  Procedure(s) Performed: SECOND STAGE BASILIC VEIN TRANSPOSITION LEFT ARM (Left )     Patient location during evaluation: PACU Anesthesia Type: General Level of consciousness: awake and alert Pain management: pain level controlled Vital Signs Assessment: post-procedure vital signs reviewed and stable Respiratory status: spontaneous breathing, nonlabored ventilation and respiratory function stable Cardiovascular status: blood pressure returned to baseline and stable Postop Assessment: no apparent nausea or vomiting Anesthetic complications: no    Last Vitals:  Vitals:   07/19/18 1158 07/19/18 1215  BP: 124/84   Pulse: 77   Resp: 19   Temp: (!) 36.1 C   SpO2: 99% 100%    Last Pain:  Vitals:   07/19/18 1158  TempSrc:   PainSc: 3                  Lynda Rainwater

## 2018-07-19 NOTE — H&P (Signed)
CC: Evaluation after creation of first stage left basilic vein fistula     History of Present Illness  Travis Bryan is a 48 y.o. ([birth year]) male who is s/p removal of left internal jugular vein tunneled dialysis catheter and first stage left basilic vein fistula creation on 04-25-18 by Dr. Trula Slade. Cephalic vein did not distend adequately for fistula creation and so the basilic vein was used.   He returns today for follow up dialysis access duplex.   Pt denies any steal type symptoms in his left upper extremity.   He currently dialyzes via right IJ TDC on T-TH-S at [facility] on [address] in [place].   He quit smoking in 2004, smoked x 20 years.  He has DM, takes insulin.   He takes coumadin for atrial fib.  He has an AICD.        Past Medical History:  Diagnosis Date  . AICD (automatic cardioverter/defibrillator) present   . Atrial flutter ([place])    a. s/p ablation 03/2016  . Chronic systolic CHF (congestive heart failure) ([place])   . HCAP (healthcare-associated pneumonia) 03/28/2018  . History of gout   . Hyperlipidemia   . Hypertension   . Myocardial infarction [facility])    "I think I had one a long long time ago" (03/28/2016)  . NICM (nonischemic cardiomyopathy) ([place])    a. LHC 6/06: pLAD 20, pLCx 20-30; b. Echo 5/15:  EF 15%, diffuse HK, restrictive physiology, trivial AI, trivial MR, mild LAE, moderate RVE, moderately reduced RVSF, moderate RAE, mild to moderate TR, PASP 43 mmHg  . Obesity   . Persistent atrial fibrillation   . Type II diabetes mellitus ([place])     Social History Social History        Tobacco Use  . Smoking status: Former Smoker    Years: 10.00    Types: Cigars    Last attempt to quit: 07/28/2002    Years since quitting: 15.8  . Smokeless tobacco: Never Used  Substance Use Topics  . Alcohol use: No    Comment: 03/28/2016 "stopped drinking 4-5 months ago"  . Drug use: No    Family History   Family History  Problem Relation Age of Onset  . Hypertension Mother   . Heart disease Mother   . Diabetes Mother     Surgical History Past Surgical History:  Procedure Laterality Date  . AV FISTULA PLACEMENT Left 04/25/2018   Procedure: ARTERIOVENOUS (AV) VS GRAFT ARM;  Surgeon: Serafina Mitchell, MD;  Location: [place];  Service: Vascular;  Laterality: Left;  . CARDIOVERSION N/A 04/07/2016   Procedure: CARDIOVERSION;  Surgeon: Thayer Headings, MD;  Location: [facility] ENDOSCOPY;  Service: Cardiovascular;  Laterality: N/A;  . CARDIOVERSION N/A 04/11/2016   Procedure: CARDIOVERSION;  Surgeon: Larey Dresser, MD;  Location: [place];  Service: Cardiovascular;  Laterality: N/A;  . ELECTROPHYSIOLOGIC STUDY N/A 03/31/2016   Procedure: A-Flutter Ablation;  Surgeon: Evans Lance, MD;  Location: [place] CV LAB;  Service: Cardiovascular;  Laterality: N/A;  . EXCHANGE OF A DIALYSIS CATHETER Left 04/25/2018   Procedure: Attempted Exchange Of A Dialysis Catheter on the Left side;  Surgeon: Serafina Mitchell, MD;  Location: [facility] OR;  Service: Vascular;  Laterality: Left;  . IMPLANTABLE CARDIOVERTER DEFIBRILLATOR (ICD) GENERATOR CHANGE N/A 08/27/2013   Procedure: ICD GENERATOR CHANGE;  Surgeon: Evans Lance, MD;  Location: [facility] CATH LAB;  Service: Cardiovascular;  Laterality: N/A;  . INSERTION OF DIALYSIS CATHETER Right 04/25/2018  Procedure: INSERTION OF TUNNELED DIALYSIS CATHETER;  Surgeon: Serafina Mitchell, MD;  [facility];  Service: Vascular;  Laterality: Right;  . TEE WITHOUT CARDIOVERSION N/A 03/31/2016   Procedure: TRANSESOPHAGEAL ECHOCARDIOGRAM (TEE);  Surgeon: Larey Dresser, MD;  Location: [place];  Service: Cardiovascular;  Laterality: N/A;        Allergies  Allergen Reactions  . No Known Allergies           Current Outpatient Medications  Medication Sig Dispense Refill  . allopurinol (ZYLOPRIM) 100 MG tablet Take 1 tablet (100 mg total) by mouth daily. 30  tablet 0  . amiodarone (PACERONE) 200 MG tablet Take 1 tablet (200 mg total) by mouth daily. 30 tablet 0  . atorvastatin (LIPITOR) 40 MG tablet Take 1 tablet (40 mg total) by mouth daily at 6 PM. 90 tablet 2  . insulin aspart (NOVOLOG) 100 UNIT/ML injection Use 3 units three times a day with meals 10 mL 3  . insulin glargine (LANTUS) 100 UNIT/ML injection Inject 0.22 mLs (22 Units total) into the skin at bedtime. 10 mL 5  . Insulin Syringe-Needle U-100 31G X 15/64" 0.3 ML MISC Use to inject insulin 4 times a day 200 each 12  . multivitamin (RENA-VIT) TABS tablet TAKE 1 TABLET BY MOUTH AT BEDTIME. 30 tablet 0  . sevelamer carbonate (RENVELA) 800 MG tablet Take 1 tablet (800 mg total) by mouth 3 (three) times daily with meals. 90 tablet 3  . warfarin (COUMADIN) 5 MG tablet Take As Directed by Coumadin Clinic 45 tablet 3  . acetaminophen (TYLENOL) 325 MG tablet Take 1-2 tablets (325-650 mg total) by mouth every 4 (four) hours as needed for mild pain. (Patient not taking: Reported on 06/10/2018)     No current facility-administered medications for this visit.      REVIEW OF SYSTEMS: see HPI for pertinent positives and negatives    PHYSICAL EXAMINATION:     Vitals:   06/10/18 1223  BP: 115/76  Pulse: 75  Resp: 18  Temp: 98.1 F (36.7 C)  TempSrc: Oral  SpO2: 95%  Weight: 225 lb (102.1 kg)  Height: 6' (1.829 m)   Body mass index is 30.52 kg/m.  General: Obese male appears his stated age.   HEENT:  No gross abnormalities Pulmonary: Respirations are non-labored, good air movement in all fields, no rales, rhonchi, or wheezes.  Abdomen: Soft and non-tender with normal bowel sounds.  Musculoskeletal: There are no major deformities.   Neurologic: No focal weakness or paresthesias are detected, M/S 5/5 in all extremities.  Skin: There are no ulcer or rashes noted. Psychiatric: The patient has normal affect. Cardiovascular: There is a regular rate and rhythm without  significant murmur appreciated. AICD palpated left upper chest.  Bilateral DP pulses are 1-2+ palpable.  Tunneled dialysis catheter present at right IJ.  Bilateral radial pulses are 2+ palpable.  There is an audible bruit at the distal and medial portions of the left upper arm AVF; there is a palpable thrill at the medial and distal portions of the AVF.   Non-Invasive Vascular Imaging  Left arm Access Duplex  (Date: 06/10/2018):  Findings: +--------------------+----------+-----------------+--------+ AVF PSV (cm/s)Flow Vol (mL/min)Comments +--------------------+----------+-----------------+--------+ Native artery inflow 295  1563   +--------------------+----------+-----------------+--------+ AVF Anastomosis  672    +--------------------+----------+-----------------+--------+  +----------------+----------+-------------+----------+-------------------------+ OUTFLOW VEIN PSV (cm/s)Diameter (cm)Depth (cm) Describe  +----------------+----------+-------------+----------+-------------------------+ Confluence  435  0.66  0.87  joins deep  +----------------+----------+-------------+----------+-------------------------+ Mid UA  369  0.50  0.65  distal to mid upper  arm       fistula  +----------------+----------+-------------+----------+-------------------------+ Mid to distal UA 419  0.46  1.03  distal to mid upper arm       fistula  +----------------+----------+-------------+----------+-------------------------+ Dist UA  539  0.46  1.03    +----------------+----------+-------------+----------+-------------------------+  Summary: Patent left Basilic vein transposition. Increased velocity in the anastamosis in the distal upper arm.   Medical Decision Making  Travis Bryan is a 48 y.o. male who is s/p removal of left internal jugular vein tunneled dialysis catheter and first stage left basilic vein fistula creation on 04-25-18.   I discussed AV fistula duplex results, HPI, and pertinent physical exam results with Dr. Trula Slade. Will schedule for 2nd stage basilic vein transposition.  Pt dialyzes via right IJ TDC.    Clemon Chambers, RN, MSN, FNP-C [facility] [place] Office: [phone]  06/10/2018, 12:51 PM  Clinic MD: Trula Slade

## 2018-07-19 NOTE — Telephone Encounter (Signed)
sch appt spk to pt mld ltr 08/19/2018 840am p/o MD

## 2018-07-19 NOTE — Discharge Instructions (Signed)
° °  [facility] ° °Discharge Instructions ° °AV Fistula or Graft Surgery for Dialysis Access ° °Please refer to the following instructions for your post-procedure care. Your surgeon or physician assistant will discuss any changes with you. ° °Activity ° °You may drive the day following your surgery, if you are comfortable and no longer taking prescription pain medication. Resume full activity as the soreness in your incision resolves. ° °Bathing/Showering ° °You may shower after you go home. Keep your incision dry for 48 hours. Do not soak in a bathtub, hot tub, or swim until the incision heals completely. You may not shower if you have a hemodialysis catheter. ° °Incision Care ° °Clean your incision with mild soap and water after 48 hours. Pat the area dry with a clean towel. You do not need a bandage unless otherwise instructed. Do not apply any ointments or creams to your incision. You may have skin glue on your incision. Do not peel it off. It will come off on its own in about one week. Your arm may swell a bit after surgery. To reduce swelling use pillows to elevate your arm so it is above your heart. Your doctor will tell you if you need to lightly wrap your arm with an ACE bandage. ° °Diet ° °Resume your normal diet. There are not special food restrictions following this procedure. In order to heal from your surgery, it is CRITICAL to get adequate nutrition. Your body requires vitamins, minerals, and protein. Vegetables are the best source of vitamins and minerals. Vegetables also provide the perfect balance of protein. Processed food has little nutritional value, so try to avoid this. ° °Medications ° °Resume taking all of your medications. If your incision is causing pain, you may take over-the counter pain relievers such as acetaminophen (Tylenol). If you were prescribed a stronger pain medication, please be aware these medications can cause nausea and constipation. Prevent  nausea by taking the medication with a snack or meal. Avoid constipation by drinking plenty of fluids and eating foods with high amount of fiber, such as fruits, vegetables, and grains. Do not take Tylenol if you are taking prescription pain medications. ° ° ° ° °Follow up °Your surgeon may want to see you in the office following your access surgery. If so, this will be arranged at the time of your surgery. ° °Please call us immediately for any of the following conditions: ° °Increased pain, redness, drainage (pus) from your incision site °Fever of 101 degrees or higher °Severe or worsening pain at your incision site °Hand pain or numbness. ° °Reduce your risk of vascular disease: ° °Stop smoking. If you would like help, call [facility] at 1-800-QUIT-NOW (1-800-784-8669) or [facility] at 336-586-4000 ° °Manage your cholesterol °Maintain a desired weight °Control your diabetes °Keep your blood pressure down ° °Dialysis ° °It will take several weeks to several months for your new dialysis access to be ready for use. Your surgeon will determine when it is OK to use it. Your nephrologist will continue to direct your dialysis. You can continue to use your Permcath until your new access is ready for use. ° °If you have any questions, please call the office at [phone]. ° °

## 2018-07-19 NOTE — Telephone Encounter (Signed)
Patient called and stated he can not get post op  Narcotic pain med  from [facility]. Left message with OR nurse for Arlee Muslim to call patient to arrange new script for patient. I informed Mr. Droge that I had paged and spoke with the PA to contact him about sending Rx. To a different drug store .

## 2018-07-19 NOTE — Telephone Encounter (Signed)
-----   Message from Dagoberto Ligas, PA-C sent at 07/19/2018 11:34 AM EDT -----  Can you schedule an appt for this pt in about 3-4 weeks with Dr. Trula Slade or NP.  PO 2nd stage basilic vein transpo. Thanks, MAtt

## 2018-07-20 ENCOUNTER — Encounter (HOSPITAL_COMMUNITY): Payer: Self-pay | Admitting: Surgery

## 2018-07-25 ENCOUNTER — Telehealth: Payer: Self-pay

## 2018-07-25 NOTE — Telephone Encounter (Signed)
LMOM FOR PRESCREEN  

## 2018-07-29 ENCOUNTER — Other Ambulatory Visit: Payer: Self-pay | Admitting: Family Medicine

## 2018-07-29 MED FILL — AMIODARONE HCL 200 MG TAB: 200 | 30 days supply | Qty: 30 | Fill #1

## 2018-07-29 MED FILL — RENA-VITE TABLET: 30 days supply | Qty: 30 | Fill #0

## 2018-07-29 MED FILL — SEVELAMER CARBONATE 800 MG: 800 | 30 days supply | Qty: 90 | Fill #1

## 2018-07-29 NOTE — Telephone Encounter (Signed)
Patient at [facility]- requesting RF of prescription- sent for PCP review of request.

## 2018-07-31 ENCOUNTER — Telehealth: Payer: Self-pay

## 2018-07-31 NOTE — Telephone Encounter (Signed)
Pt calling regarding staring coumadin post procedure. I instructed pt to follow Dr. Aleen Campi orders and to call him. Pt became irate and yelling bye.

## 2018-07-31 NOTE — Telephone Encounter (Signed)
Spoke with patient who states that he never resumed his warfarin therapy. He thought that Dr. Arita Miss would call with instructions, but he has not heard any thing and wanted to check with [place]. He denies any complications post-procedure, such as bleeding or bruising.   He did no show his f/u INR check last week because he was not taking his warfarin.   Advised him to resume today with 2 tablets today and 1.5 tablets tomorrow then back on normal dosage until recheck INR in 1 week.

## 2018-08-06 ENCOUNTER — Telehealth: Payer: Self-pay

## 2018-08-06 NOTE — Telephone Encounter (Signed)
lmom for prescreen  

## 2018-08-07 ENCOUNTER — Other Ambulatory Visit: Payer: Self-pay

## 2018-08-07 ENCOUNTER — Ambulatory Visit (INDEPENDENT_AMBULATORY_CARE_PROVIDER_SITE_OTHER): Payer: Medicaid Other | Admitting: *Deleted

## 2018-08-07 DIAGNOSIS — I483 Typical atrial flutter: Secondary | ICD-10-CM

## 2018-08-07 DIAGNOSIS — Z5181 Encounter for therapeutic drug level monitoring: Secondary | ICD-10-CM

## 2018-08-07 DIAGNOSIS — I4819 Other persistent atrial fibrillation: Secondary | ICD-10-CM

## 2018-08-07 DIAGNOSIS — Z95810 Presence of automatic (implantable) cardiac defibrillator: Secondary | ICD-10-CM

## 2018-08-07 LAB — POCT INR: INR: 1.8 — AB (ref 2.0–3.0)

## 2018-08-07 NOTE — Patient Instructions (Addendum)
  Description   Spoke with pt and instructed pt to take 2 tablets today then continue taking 1.5 tablets daily except 1 tablet on Tuesdays, Thursday, and Saturdays. Recheck in 3 weeks. Coumadin Clinic [phone]. Do 2 servings of greens a week

## 2018-08-16 ENCOUNTER — Ambulatory Visit
Admission: EM | Admit: 2018-08-16 | Discharge: 2018-08-16 | Disposition: A | Discharge status: Home / Self Care | Payer: Medicare Other | Attending: Family Medicine | Admitting: Family Medicine

## 2018-08-16 ENCOUNTER — Emergency Department (HOSPITAL_COMMUNITY)
Admission: EM | Admit: 2018-08-16 | Discharge: 2018-08-16 | Disposition: A | Discharge status: Home / Self Care | Payer: Medicare Other | Attending: Emergency Medicine | Admitting: Emergency Medicine

## 2018-08-16 ENCOUNTER — Emergency Department (HOSPITAL_COMMUNITY): Payer: Medicare Other

## 2018-08-16 ENCOUNTER — Encounter (HOSPITAL_COMMUNITY): Payer: Self-pay

## 2018-08-16 ENCOUNTER — Encounter: Payer: Self-pay | Admitting: Emergency Medicine

## 2018-08-16 ENCOUNTER — Other Ambulatory Visit: Payer: Self-pay

## 2018-08-16 DIAGNOSIS — E669 Obesity, unspecified: Secondary | ICD-10-CM | POA: Insufficient documentation

## 2018-08-16 DIAGNOSIS — Z79899 Other long term (current) drug therapy: Secondary | ICD-10-CM | POA: Insufficient documentation

## 2018-08-16 DIAGNOSIS — Z7901 Long term (current) use of anticoagulants: Secondary | ICD-10-CM | POA: Insufficient documentation

## 2018-08-16 DIAGNOSIS — E1122 Type 2 diabetes mellitus with diabetic chronic kidney disease: Secondary | ICD-10-CM | POA: Insufficient documentation

## 2018-08-16 DIAGNOSIS — N186 End stage renal disease: Secondary | ICD-10-CM | POA: Insufficient documentation

## 2018-08-16 DIAGNOSIS — R0602 Shortness of breath: Secondary | ICD-10-CM | POA: Insufficient documentation

## 2018-08-16 DIAGNOSIS — Z992 Dependence on renal dialysis: Secondary | ICD-10-CM | POA: Diagnosis not present

## 2018-08-16 DIAGNOSIS — R000 Tachycardia, unspecified: Secondary | ICD-10-CM | POA: Insufficient documentation

## 2018-08-16 DIAGNOSIS — I5022 Chronic systolic (congestive) heart failure: Secondary | ICD-10-CM | POA: Insufficient documentation

## 2018-08-16 DIAGNOSIS — I132 Hypertensive heart and chronic kidney disease with heart failure and with stage 5 chronic kidney disease, or end stage renal disease: Secondary | ICD-10-CM | POA: Diagnosis not present

## 2018-08-16 DIAGNOSIS — Z6830 Body mass index (BMI) 30.0-30.9, adult: Secondary | ICD-10-CM | POA: Diagnosis not present

## 2018-08-16 DIAGNOSIS — Z794 Long term (current) use of insulin: Secondary | ICD-10-CM | POA: Diagnosis not present

## 2018-08-16 DIAGNOSIS — Z95811 Presence of heart assist device: Secondary | ICD-10-CM | POA: Diagnosis not present

## 2018-08-16 DIAGNOSIS — I4891 Unspecified atrial fibrillation: Secondary | ICD-10-CM | POA: Insufficient documentation

## 2018-08-16 LAB — CBC WITH DIFFERENTIAL/PLATELET
Abs Immature Granulocytes: 0.01 10*3/uL (ref 0.00–0.07)
Basophils Absolute: 0 10*3/uL (ref 0.0–0.1)
Basophils Relative: 0 %
Eosinophils Absolute: 0.2 10*3/uL (ref 0.0–0.5)
Eosinophils Relative: 3 %
HCT: 32.4 % — ABNORMAL LOW (ref 39.0–52.0)
Hemoglobin: 10.1 g/dL — ABNORMAL LOW (ref 13.0–17.0)
Immature Granulocytes: 0 %
Lymphocytes Relative: 16 %
Lymphs Abs: 1.1 10*3/uL (ref 0.7–4.0)
MCH: 29.8 pg (ref 26.0–34.0)
MCHC: 31.2 g/dL (ref 30.0–36.0)
MCV: 95.6 fL (ref 80.0–100.0)
Monocytes Absolute: 0.6 10*3/uL (ref 0.1–1.0)
Monocytes Relative: 8 %
Neutro Abs: 4.9 10*3/uL (ref 1.7–7.7)
Neutrophils Relative %: 73 %
Platelets: 248 10*3/uL (ref 150–400)
RBC: 3.39 MIL/uL — ABNORMAL LOW (ref 4.22–5.81)
RDW: 14.5 % (ref 11.5–15.5)
WBC: 6.8 10*3/uL (ref 4.0–10.5)
nRBC: 0 % (ref 0.0–0.2)

## 2018-08-16 LAB — BASIC METABOLIC PANEL
Anion gap: 14 (ref 5–15)
BUN: 37 mg/dL — ABNORMAL HIGH (ref 6–20)
CO2: 24 mmol/L (ref 22–32)
Calcium: 9.3 mg/dL (ref 8.9–10.3)
Chloride: 99 mmol/L (ref 98–111)
Creatinine, Ser: 9.5 mg/dL — ABNORMAL HIGH (ref 0.61–1.24)
GFR calc Af Amer: 7 mL/min — ABNORMAL LOW (ref >60)
GFR calc non Af Amer: 6 mL/min — ABNORMAL LOW (ref >60)
Glucose, Bld: 258 mg/dL — ABNORMAL HIGH (ref 70–99)
Potassium: 3.9 mmol/L (ref 3.5–5.1)
Sodium: 137 mmol/L (ref 135–145)

## 2018-08-16 LAB — PROTIME-INR
INR: 2.5 — ABNORMAL HIGH (ref 0.8–1.2)
Prothrombin Time: 26.8 seconds — ABNORMAL HIGH (ref 11.4–15.2)

## 2018-08-16 LAB — MAGNESIUM: Magnesium: 2.1 mg/dL (ref 1.7–2.4)

## 2018-08-16 LAB — BRAIN NATRIURETIC PEPTIDE: B Natriuretic Peptide: 1691.8 pg/mL — ABNORMAL HIGH (ref 0.0–100.0)

## 2018-08-16 MED ORDER — METOPROLOL TARTRATE 25 MG PO TABS: 12.5000 mg | ORAL_TABLET | Freq: Two times a day (BID) | ORAL | 0 refills | Status: DC

## 2018-08-16 MED ORDER — SODIUM CHLORIDE 0.9 % IV BOLUS
500.0000 mL | Freq: Once | INTRAVENOUS | Status: AC
  Administered 2018-08-16: 500 mL via INTRAVENOUS

## 2018-08-16 MED ORDER — DILTIAZEM HCL 25 MG/5ML IV SOLN
20.0000 mg | Freq: Once | INTRAVENOUS | Status: AC
  Administered 2018-08-16: 20 mg via INTRAVENOUS
  Filled 2018-08-16: qty 5

## 2018-08-16 MED ORDER — DILTIAZEM HCL 25 MG/5ML IV SOLN: 10.0000 mg | Freq: Once | INTRAVENOUS | Status: DC

## 2018-08-16 MED FILL — ATORVASTATIN 40 MG TABLET: 40 | 30 days supply | Qty: 30 | Fill #2

## 2018-08-16 NOTE — ED Notes (Signed)
Due to EKG changes, EMS called for patient.  Patient then began to refuse EMS transport.  Travis Miners, NP explained to patient the rationale of need for EMS transport, and continued to encourage patient to take EMS tranport, patient aware they were already en route.  Patient chose to walk out of medical facility, states he will have family take him to the ER.

## 2018-08-16 NOTE — ED Provider Notes (Signed)
[facility] EMERGENCY DEPARTMENT Provider Note   CSN: [id number] Arrival date & time: 08/16/18  1331    History   Chief Complaint Chief Complaint  Patient presents with  . Shortness of Breath    HPI Travis Bryan is a 48 y.o. male who presents with shortness of breath.  Past medical history significant for ESRD on dialysis Tuesday, Thursday, Saturday, insulin-dependent diabetes, persistent atrial fibrillation on Coumadin, obesity, hypertension, hyperlipidemia, CHF, AICD placement.  Patient states that he has had intermittent shortness of breath with exertion over the past week.  He has not had any other associated symptoms.  Shortness of breath will improve with rest.  He denies fever, chills, sweats, syncope, dizziness, chest pain, abdominal pain, swelling in the abdomen or legs.  He was fully dialyzed yesterday.  He went to urgent care today because he felt like his symptoms are not improving and since his heart rate was very elevated and he was mildly hypoxic they wanted him to come to the emergency department.  Patient denies any recent change in his medicines or missing any doses.     HPI  Past Medical History:  Diagnosis Date  . AICD (automatic cardioverter/defibrillator) present   . Atrial flutter ([place])    a. s/p ablation 03/2016  . Chronic kidney disease   . Chronic systolic CHF (congestive heart failure) ([place])   . HCAP (healthcare-associated pneumonia) 03/28/2018  . History of gout   . Hyperlipidemia   . Hypertension   . Myocardial infarction [facility])    "I think I had one a long long time ago" (03/28/2016)  . NICM (nonischemic cardiomyopathy) ([place])    a. LHC 6/06: pLAD 20, pLCx 20-30; b. Echo 5/15:  EF 15%, diffuse HK, restrictive physiology, trivial AI, trivial MR, mild LAE, moderate RVE, moderately reduced RVSF, moderate RAE, mild to moderate TR, PASP 43 mmHg  . Obesity   . Persistent atrial fibrillation   . Stroke ([place])   . Type II diabetes mellitus  [facility])     Patient Active Problem List   Diagnosis Date Noted  . Acute renal failure superimposed on stage 4 chronic kidney disease ([place]) 05/01/2018  . Debility 04/26/2018  . Hypoxia   . Post-operative state   . Anemia of chronic disease   . PAF (paroxysmal atrial fibrillation) ([place])   . Pressure injury of skin 04/17/2018  . Bilateral pneumonia 03/29/2018  . Acute respiratory failure with hypoxia ([place]) 03/28/2018  . DKA (diabetic ketoacidosis) ([place]) 02/25/2018  . Acute on chronic systolic heart failure, NYHA class 4 ([place]) 02/25/2018  . Hyperlipidemia 02/25/2018  . Atrial fibrillation ([place]) 02/25/2018  . CAD (coronary artery disease) 02/25/2018  . Elevated troponin 02/25/2018  . Sepsis due to pneumonia ([place]) 02/25/2018  . AKI (acute kidney injury) ([place]) 02/25/2018  . Type 2 diabetes mellitus with complication, with long-term current use of insulin ([place]) 12/26/2017  . At risk for sexually transmitted disease due to unprotected sex 08/23/2017  . Healthcare maintenance 06/24/2016  . Persistent atrial fibrillation   . CARDIOMYOPATHY, SECONDARY 07/03/2008  . HFrEF (heart failure with reduced ejection fraction) ([place]) 07/03/2008  . Automatic implantable cardioverter-defibrillator in situ 07/03/2008  . Type 2 diabetes mellitus ([place]) 12/26/2005  . Hyperlipidemia associated with type 2 diabetes mellitus ([place]) 12/26/2005  . Hypertension associated with diabetes ([place]) 12/26/2005    Past Surgical History:  Procedure Laterality Date  . AV FISTULA PLACEMENT Left 04/25/2018   Procedure: ARTERIOVENOUS (AV) VS GRAFT ARM;  Surgeon: Serafina Mitchell, MD;  Location: [facility] OR;  Service: Vascular;  Laterality: Left;  . BASCILIC VEIN TRANSPOSITION Left 07/19/2018   Procedure: SECOND STAGE BASILIC VEIN TRANSPOSITION LEFT ARM;  Surgeon: Serafina Mitchell, MD;  Location: [facility];  Service: Vascular;  Laterality: Left;  . CARDIOVERSION N/A 04/07/2016   Procedure: CARDIOVERSION;  Surgeon: Thayer Headings, MD;   Location: [facility] ENDOSCOPY;  Service: Cardiovascular;  Laterality: N/A;  . CARDIOVERSION N/A 04/11/2016   Procedure: CARDIOVERSION;  Surgeon: Larey Dresser, MD;  Location: [place];  Service: Cardiovascular;  Laterality: N/A;  . ELECTROPHYSIOLOGIC STUDY N/A 03/31/2016   Procedure: A-Flutter Ablation;  Surgeon: Evans Lance, MD;  Location: [facility] CV LAB;  Service: Cardiovascular;  Laterality: N/A;  . EXCHANGE OF A DIALYSIS CATHETER Left 04/25/2018   Procedure: Attempted Exchange Of A Dialysis Catheter on the Left side;  Surgeon: Serafina Mitchell, MD;  Location: [facility] OR;  Service: Vascular;  Laterality: Left;  . IMPLANTABLE CARDIOVERTER DEFIBRILLATOR (ICD) GENERATOR CHANGE N/A 08/27/2013   Procedure: ICD GENERATOR CHANGE;  Surgeon: Evans Lance, MD;  Location: [facility] CATH LAB;  Service: Cardiovascular;  Laterality: N/A;  . INSERTION OF DIALYSIS CATHETER Right 04/25/2018   Procedure: INSERTION OF TUNNELED DIALYSIS CATHETER;  Surgeon: Serafina Mitchell, MD;  Location: [facility] OR;  Service: Vascular;  Laterality: Right;  . TEE WITHOUT CARDIOVERSION N/A 03/31/2016   Procedure: TRANSESOPHAGEAL ECHOCARDIOGRAM (TEE);  Surgeon: Larey Dresser, MD;  Location: [facility];  Service: Cardiovascular;  Laterality: N/A;        Home Medications    Prior to Admission medications   Medication Sig Start Date End Date Taking? Authorizing Provider  allopurinol (ZYLOPRIM) 100 MG tablet Take 1 tablet (100 mg total) by mouth daily. 05/04/18   Love, Ivan Anchors, PA-C  amiodarone (PACERONE) 200 MG tablet Take 1 tablet (200 mg total) by mouth daily. 05/30/18   Larey Dresser, MD  atorvastatin (LIPITOR) 40 MG tablet Take 1 tablet (40 mg total) by mouth daily at 6 PM. 05/13/18   [name], FNP  HYDROcodone-acetaminophen (NORCO) 5-325 MG tablet Take 1 tablet by mouth every 6 (six) hours as needed for moderate pain. 07/19/18   Dagoberto Ligas, PA-C  insulin aspart (NOVOLOG) 100 UNIT/ML injection Use 3 units three times a  day with meals Patient taking differently: Inject 3 Units into the skin 3 (three) times daily with meals. Use 3 units three times a day with meals 05/13/18 05/13/19  [name], FNP  insulin glargine (LANTUS) 100 UNIT/ML injection Inject 0.22 mLs (22 Units total) into the skin at bedtime. 05/13/18   [name], FNP  Insulin Syringe-Needle U-100 31G X 15/64" 0.3 ML MISC Use to inject insulin 4 times a day 05/13/18   [name], FNP  multivitamin (RENA-VIT) TABS tablet TAKE 1 TABLET BY MOUTH AT BEDTIME. 07/29/18   [name], FNP  sevelamer carbonate (RENVELA) 800 MG tablet Take 1 tablet (800 mg total) by mouth 3 (three) times daily with meals. 05/13/18   [name], FNP  warfarin (COUMADIN) 5 MG tablet Take As Directed by Coumadin Clinic Patient taking differently: Take 5-7.5 mg by mouth See admin instructions. Take As Directed by Coumadin Clinic 5 mg (5 mg x 1) every Tue, Thu, Sat; 7.5 mg (5 mg x 1.5) all other days 05/24/18   Larey Dresser, MD    Family History Family History  Problem Relation Age of Onset  . Hypertension Mother   . Heart disease Mother   .  Diabetes Mother     Social History Social History   Tobacco Use  . Smoking status: Never Smoker  . Smokeless tobacco: Never Used  . Tobacco comment: pt denies smoking cigars  Substance Use Topics  . Alcohol use: No  . Drug use: No     Allergies   No known allergies   Review of Systems Review of Systems  Constitutional: Negative for fever.  Respiratory: Positive for shortness of breath. Negative for cough.   Cardiovascular: Negative for chest pain, palpitations and leg swelling.  Gastrointestinal: Negative for abdominal distention and abdominal pain.  Allergic/Immunologic: Positive for immunocompromised state.  All other systems reviewed and are negative.    Physical Exam Updated Vital Signs BP (!) 127/91 (BP Location: Left Arm)   Pulse 91   Temp 98.8 F (37.1 C) (Oral)   Resp 18    Ht 5\' 10"  (1.778 m)   Wt 97.5 kg   SpO2 93%   BMI 30.85 kg/m   Physical Exam Vitals signs and nursing note reviewed.  Constitutional:      General: He is not in acute distress.    Appearance: He is well-developed. He is obese. He is diaphoretic. He is not ill-appearing.  HENT:     Head: Normocephalic and atraumatic.  Eyes:     General: No scleral icterus.       Right eye: No discharge.        Left eye: No discharge.     Conjunctiva/sclera: Conjunctivae normal.     Pupils: Pupils are equal, round, and reactive to light.  Neck:     Musculoskeletal: Normal range of motion.  Cardiovascular:     Rate and Rhythm: Tachycardia present. Rhythm irregularly irregular.     Heart sounds: No murmur. No friction rub. No gallop.      Comments: Fistula in left upper extremity.  Dialysis catheter in right upper chest wall Pulmonary:     Effort: Pulmonary effort is normal. No respiratory distress.     Breath sounds: Normal breath sounds.  Abdominal:     General: There is no distension.     Palpations: Abdomen is soft.     Tenderness: There is no abdominal tenderness.  Musculoskeletal:     Right lower leg: No edema.     Left lower leg: No edema.  Skin:    General: Skin is warm.  Neurological:     Mental Status: He is alert and oriented to person, place, and time.  Psychiatric:        Behavior: Behavior normal.      ED Treatments / Results  Labs (all labs ordered are listed, but only abnormal results are displayed) Labs Reviewed  BASIC METABOLIC PANEL - Abnormal; Notable for the following components:      Result Value   Glucose, Bld 258 (*)    BUN 37 (*)    Creatinine, Ser 9.50 (*)    GFR calc non Af Amer 6 (*)    GFR calc Af Amer 7 (*)    All other components within normal limits  CBC WITH DIFFERENTIAL/PLATELET - Abnormal; Notable for the following components:   RBC 3.39 (*)    Hemoglobin 10.1 (*)    HCT 32.4 (*)    All other components within normal limits  PROTIME-INR -  Abnormal; Notable for the following components:   Prothrombin Time 26.8 (*)    INR 2.5 (*)    All other components within normal limits  MAGNESIUM  RAPID URINE DRUG  SCREEN, HOSP PERFORMED  BRAIN NATRIURETIC PEPTIDE    EKG None  Radiology Dg Chest 2 View  Result Date: 08/16/2018 CLINICAL DATA:  Shortness of breath. EXAM: CHEST - 2 VIEW COMPARISON:  04/25/2018. FINDINGS: Right jugular catheter tip in the upper right atrium. Left subclavian AICD lead tip at the right ventricular apex. Stable enlarged cardiac silhouette. Diffusely prominent interstitial markings with no significant change. No pleural fluid. Unremarkable bones. IMPRESSION: No acute abnormality. Stable cardiomegaly and chronic interstitial lung disease. Electronically Signed   By: Claudie Revering M.D.   On: 08/16/2018 15:09    Procedures Procedures (including critical care time)  Medications Ordered in ED Medications  diltiazem (CARDIZEM) injection 20 mg (has no administration in time range)  sodium chloride 0.9 % bolus 500 mL (has no administration in time range)     Initial Impression / Assessment and Plan / ED Course  I have reviewed the triage vital signs and the nursing notes.  Pertinent labs & imaging results that were available during my care of the patient were reviewed by me and considered in my medical decision making (see chart for details).  48 year old male presents with SOB. He is found to be in A.fib with RVR. SOB is worse with exertion but it is not all the time. He is hypertensive and HR is in the 130s.  Otherwise vital signs are normal.  There was a sat of 88% at urgent care however he is not been hypoxic here.  EKG shows A. fib with RVR with left bundle branch block.  Will order chest x-ray, labs and give diltiazem bolus.  Unclear reason for uncontrolled rate.  There are no signs of infection.  No clinical signs of DVT and he is anticoagulated so doubt PE.  Possibly related to being over or under dialyzed.   Chest x-ray is negative.  There is stable cardiomegaly with chronic interstitial lung disease.  INR is therapeutic.  CBC shows mild anemia at 10.1.  Rest of labs are pending. Shared visit with Dr. Sedonia Small. Care signed out to A Law PA-C at shift change.  Final Clinical Impressions(s) / ED Diagnoses   Final diagnoses:  Atrial fibrillation with RVR [facility])    ED Discharge Orders    None       Recardo Evangelist, PA-C 08/16/18 1542    Maudie Flakes, MD 08/21/18 [phone]

## 2018-08-16 NOTE — Discharge Instructions (Addendum)
Sending to ER, recommended EMS transport but pt refused. Family taking him to hospital.

## 2018-08-16 NOTE — ED Triage Notes (Signed)
Pt reports increased SOB over the past few days, denies any chest pain. Pt sent here from [facility] fro evaluation. HR 130s in triage, Pt a.o, nad noted.

## 2018-08-16 NOTE — ED Notes (Signed)
Pt has [place] ICD

## 2018-08-16 NOTE — ED Triage Notes (Signed)
PT presents to [facility] for assessment after he has noticed shortness of breath when walking from home to car since last weekend, but states it has been worsening since 2 days ago.

## 2018-08-16 NOTE — ED Provider Notes (Signed)
Signout from [employer], PA-C at shift change See previous providers note for full H&P  Briefly, patient is in A fib RVR after being short of breath with exertion. Dialysis TThSat. ICD. Bolus of cardizem trial. Normally controlled with amiodarone.  Plan to recheck after Cardizem bolus and speak with cardiology for further rate control for home.  Physical Exam  BP (!) 121/91   Pulse (!) 29   Temp 98.8 F (37.1 C) (Oral)   Resp (!) 24   Ht 5\' 10"  (1.778 m)   Wt 97.5 kg   SpO2 96%   BMI 30.85 kg/m   Physical Exam Vitals signs and nursing note reviewed.  Constitutional:      General: He is not in acute distress.    Appearance: He is well-developed. He is not diaphoretic.  HENT:     Head: Normocephalic and atraumatic.     Mouth/Throat:     Pharynx: No oropharyngeal exudate.  Eyes:     General: No scleral icterus.       Right eye: No discharge.        Left eye: No discharge.     Conjunctiva/sclera: Conjunctivae normal.     Pupils: Pupils are equal, round, and reactive to light.  Neck:     Musculoskeletal: Normal range of motion and neck supple.     Thyroid: No thyromegaly.  Cardiovascular:     Rate and Rhythm: Regular rhythm. Tachycardia present.     Heart sounds: Normal heart sounds. No murmur. No friction rub. No gallop.   Pulmonary:     Effort: Pulmonary effort is normal. No respiratory distress.     Breath sounds: Normal breath sounds. No stridor. No wheezing or rales.  Abdominal:     General: Bowel sounds are normal. There is no distension.     Palpations: Abdomen is soft.     Tenderness: There is no abdominal tenderness. There is no guarding or rebound.  Lymphadenopathy:     Cervical: No cervical adenopathy.  Skin:    General: Skin is warm and dry.     Coloration: Skin is not pale.     Findings: No rash.  Neurological:     Mental Status: He is alert.     Coordination: Coordination normal.     ED Course/Procedures     Procedures  MDM  Patient's heart  rate improved to the mid 90s to low 100s.  He is feeling much improved.  Labs are stable for the patient.  I spoke with Dr. Percival Spanish with cardiology who advised metoprolol 12.5 twice daily.  Hold metoprolol prior to dialysis to help prevent hypotension.  Dr. Percival Spanish will arrange follow-up in clinic for further management of patient's atrial fibrillation.  Return precautions discussed.  Patient understands and agrees with plan.  Patient discharged in stable condition.       Frederica Kuster, PA-C 08/16/18 1927    Maudie Flakes, MD 08/18/18 [phone]

## 2018-08-16 NOTE — ED Provider Notes (Signed)
[facility]    CSN: [id number] Arrival date & time: 08/16/18  1143     History   Chief Complaint Chief Complaint  Patient presents with  . Shortness of Breath    HPI Gunner Iodice is a 48 y.o. male.   Patient is a 48 year old male with a past medical history of a flutter, CKD, CHF, hypertension, hyperlipidemia, gout, AICD, dialysis patient, MI, cardiomyopathy, stroke, diabetes type 2, pneumonia, hypoxia.  He presents today with approximately 1 week of shortness of breath more with exertion.  This worsened over the last 2 days.  He has had some mild chest discomfort.  Denies any associated cough, congestion or fevers.  He has low-grade fever here today and oxygen saturations are mildly low at 88%.  He is tachycardic at 130 with abnormal and EKG changes.  Diaphoretic while sitting.  Denies any COVID exposures or recent illnesses.  He does go to dialysis 3 days a week.  No lower extremity swelling.  No history of DVT or PE.  Patient is on Coumadin.  ROS per HPI      Past Medical History:  Diagnosis Date  . AICD (automatic cardioverter/defibrillator) present   . Atrial flutter ([place])    a. s/p ablation 03/2016  . Chronic kidney disease   . Chronic systolic CHF (congestive heart failure) ([place])   . HCAP (healthcare-associated pneumonia) 03/28/2018  . History of gout   . Hyperlipidemia   . Hypertension   . Myocardial infarction [facility])    "I think I had one a long long time ago" (03/28/2016)  . NICM (nonischemic cardiomyopathy) ([place])    a. LHC 6/06: pLAD 20, pLCx 20-30; b. Echo 5/15:  EF 15%, diffuse HK, restrictive physiology, trivial AI, trivial MR, mild LAE, moderate RVE, moderately reduced RVSF, moderate RAE, mild to moderate TR, PASP 43 mmHg  . Obesity   . Persistent atrial fibrillation   . Stroke ([place])   . Type II diabetes mellitus [facility])     Patient Active Problem List   Diagnosis Date Noted  . Acute renal failure superimposed on stage 4 chronic kidney disease  ([place]) 05/01/2018  . Debility 04/26/2018  . Hypoxia   . Post-operative state   . Anemia of chronic disease   . PAF (paroxysmal atrial fibrillation) ([place])   . Pressure injury of skin 04/17/2018  . Bilateral pneumonia 03/29/2018  . Acute respiratory failure with hypoxia ([place]) 03/28/2018  . DKA (diabetic ketoacidosis) ([place]) 02/25/2018  . Acute on chronic systolic heart failure, NYHA class 4 ([place]) 02/25/2018  . Hyperlipidemia 02/25/2018  . Atrial fibrillation ([place]) 02/25/2018  . CAD (coronary artery disease) 02/25/2018  . Elevated troponin 02/25/2018  . Sepsis due to pneumonia ([place]) 02/25/2018  . AKI (acute kidney injury) ([name]) 02/25/2018  . Type 2 diabetes mellitus with complication, with long-term current use of insulin ([place]) 12/26/2017  . At risk for sexually transmitted disease due to unprotected sex 08/23/2017  . Healthcare maintenance 06/24/2016  . Persistent atrial fibrillation   . CARDIOMYOPATHY, SECONDARY 07/03/2008  . HFrEF (heart failure with reduced ejection fraction) ([place]) 07/03/2008  . Automatic implantable cardioverter-defibrillator in situ 07/03/2008  . Type 2 diabetes mellitus ([place]) 12/26/2005  . Hyperlipidemia associated with type 2 diabetes mellitus ([place]) 12/26/2005  . Hypertension associated with diabetes ([place]) 12/26/2005    Past Surgical History:  Procedure Laterality Date  . AV FISTULA PLACEMENT Left 04/25/2018   Procedure: ARTERIOVENOUS (AV) VS GRAFT ARM;  Surgeon: Serafina Mitchell, MD;  Location: [place];  Service: Vascular;  Laterality: Left;  . BASCILIC VEIN TRANSPOSITION Left 07/19/2018   Procedure: SECOND STAGE BASILIC VEIN TRANSPOSITION LEFT ARM;  Surgeon: Serafina Mitchell, MD;  Location: [place];  Service: Vascular;  Laterality: Left;  . CARDIOVERSION N/A 04/07/2016   Procedure: CARDIOVERSION;  Surgeon: Thayer Headings, MD;  Location: [facility] ENDOSCOPY;  Service: Cardiovascular;  Laterality: N/A;  . CARDIOVERSION N/A 04/11/2016   Procedure: CARDIOVERSION;  Surgeon:  Larey Dresser, MD;  Location: [place];  Service: Cardiovascular;  Laterality: N/A;  . ELECTROPHYSIOLOGIC STUDY N/A 03/31/2016   Procedure: A-Flutter Ablation;  Surgeon: Evans Lance, MD;  Location: [place] CV LAB;  Service: Cardiovascular;  Laterality: N/A;  . EXCHANGE OF A DIALYSIS CATHETER Left 04/25/2018   Procedure: Attempted Exchange Of A Dialysis Catheter on the Left side;  Surgeon: Serafina Mitchell, MD;  Location: [facility] OR;  Service: Vascular;  Laterality: Left;  . IMPLANTABLE CARDIOVERTER DEFIBRILLATOR (ICD) GENERATOR CHANGE N/A 08/27/2013   Procedure: ICD GENERATOR CHANGE;  Surgeon: Evans Lance, MD;  Location: [facility] CATH LAB;  Service: Cardiovascular;  Laterality: N/A;  . INSERTION OF DIALYSIS CATHETER Right 04/25/2018   Procedure: INSERTION OF TUNNELED DIALYSIS CATHETER;  Surgeon: Serafina Mitchell, MD;  Location: [facility] OR;  Service: Vascular;  Laterality: Right;  . TEE WITHOUT CARDIOVERSION N/A 03/31/2016   Procedure: TRANSESOPHAGEAL ECHOCARDIOGRAM (TEE);  Surgeon: Larey Dresser, MD;  Location: [place];  Service: Cardiovascular;  Laterality: N/A;       Home Medications    Prior to Admission medications   Medication Sig Start Date End Date Taking? Authorizing Provider  allopurinol (ZYLOPRIM) 100 MG tablet Take 1 tablet (100 mg total) by mouth daily. 05/04/18   Love, Ivan Anchors, PA-C  amiodarone (PACERONE) 200 MG tablet Take 1 tablet (200 mg total) by mouth daily. 05/30/18   Larey Dresser, MD  atorvastatin (LIPITOR) 40 MG tablet Take 1 tablet (40 mg total) by mouth daily at 6 PM. 05/13/18   [name], FNP  HYDROcodone-acetaminophen (NORCO) 5-325 MG tablet Take 1 tablet by mouth every 6 (six) hours as needed for moderate pain. 07/19/18   Dagoberto Ligas, PA-C  insulin aspart (NOVOLOG) 100 UNIT/ML injection Use 3 units three times a day with meals Patient taking differently: Inject 3 Units into the skin 3 (three) times daily with meals. Use 3 units three times a day with  meals 05/13/18 05/13/19  [name], FNP  insulin glargine (LANTUS) 100 UNIT/ML injection Inject 0.22 mLs (22 Units total) into the skin at bedtime. 05/13/18   [name], FNP  Insulin Syringe-Needle U-100 31G X 15/64" 0.3 ML MISC Use to inject insulin 4 times a day 05/13/18   [name], FNP  multivitamin (RENA-VIT) TABS tablet TAKE 1 TABLET BY MOUTH AT BEDTIME. 07/29/18   [name], FNP  sevelamer carbonate (RENVELA) 800 MG tablet Take 1 tablet (800 mg total) by mouth 3 (three) times daily with meals. 05/13/18   [name], FNP  warfarin (COUMADIN) 5 MG tablet Take As Directed by [facility] Clinic Patient taking differently: Take 5-7.5 mg by mouth See admin instructions. Take As Directed by Coumadin Clinic 5 mg (5 mg x 1) every Tue, Thu, Sat; 7.5 mg (5 mg x 1.5) all other days 05/24/18   Larey Dresser, MD    Family History Family History  Problem Relation Age of Onset  . Hypertension Mother   . Heart disease Mother   . Diabetes Mother  Social History Social History   Tobacco Use  . Smoking status: Never Smoker  . Smokeless tobacco: Never Used  . Tobacco comment: pt denies smoking cigars  Substance Use Topics  . Alcohol use: No  . Drug use: No     Allergies   No known allergies   Review of Systems Review of Systems   Physical Exam Triage Vital Signs ED Triage Vitals  Enc Vitals Group     BP 08/16/18 1156 123/73     Pulse Rate 08/16/18 1156 97     Resp 08/16/18 1156 (!) 24     Temp 08/16/18 1156 99.5 F (37.5 C)     Temp Source 08/16/18 1156 Oral     SpO2 08/16/18 1156 (!) 88 %     Weight --      Height --      Head Circumference --      Peak Flow --      Pain Score 08/16/18 1157 0     Pain Loc --      Pain Edu? --      Excl. in Echo? --    No data found.  Updated Vital Signs BP 123/73 (BP Location: Left Arm)   Pulse 97   Temp 99.5 F (37.5 C) (Oral)   Resp (!) 24   SpO2 (!) 88%   Visual Acuity Right Eye Distance:    Left Eye Distance:   Bilateral Distance:    Right Eye Near:   Left Eye Near:    Bilateral Near:     Physical Exam Vitals signs and nursing note reviewed.  Constitutional:      Appearance: He is diaphoretic.  HENT:     Head: Normocephalic and atraumatic.  Neck:     Musculoskeletal: Normal range of motion.  Cardiovascular:     Rate and Rhythm: Tachycardia present.  Pulmonary:     Effort: Pulmonary effort is normal. No accessory muscle usage or respiratory distress.  Musculoskeletal:     Right lower leg: He exhibits no tenderness. No edema.     Left lower leg: He exhibits no tenderness. No edema.  Skin:    General: Skin is warm.  Neurological:     Mental Status: He is alert.  Psychiatric:        Mood and Affect: Mood normal.      UC Treatments / Results  Labs (all labs ordered are listed, but only abnormal results are displayed) Labs Reviewed - No data to display  EKG None  Radiology No results found.  Procedures Procedures (including critical care time)  Medications Ordered in UC Medications - No data to display  Initial Impression / Assessment and Plan / UC Course  I have reviewed the triage vital signs and the nursing notes.  Pertinent labs & imaging results that were available during my care of the patient were reviewed by me and considered in my medical decision making (see chart for details).     Patient is a 48 year old male with a significant past medical history that presents with increasing shortness of breath more with exertion. He was found to have low-grade fever here in clinic and is diaphoretic with sitting. No respiratory distress but sats are low He did attend his dialysis treatment yesterday. He was found to have EKG changes with sinus tachycardia at a rate of 131 with right axis deviation, block and wide QRS complexes.  Worried about COIVID.  This is very concerning.  Recommended transport by EMS to the  hospital.  Patient refused  transport and will have his family taken.  This was AGAINST MEDICAL ADVICE.    Final Clinical Impressions(s) / UC Diagnoses   Final diagnoses:  Tachycardia  Shortness of breath     Discharge Instructions     Sending to ER, recommended EMS transport but pt refused. Family taking him to hospital.     ED Prescriptions    None     Controlled Substance Prescriptions [place] Controlled Substance Registry consulted? Not Applicable   Orvan July, NP 08/16/18 1233

## 2018-08-16 NOTE — ED Notes (Signed)
Pt Dialysis schedule: Travis Bryan  & Sat.

## 2018-08-16 NOTE — Discharge Instructions (Signed)
Begin taking metoprolol 12.5 mg twice daily.  Do not take it before dialysis so that your blood pressure does not drop too low.  You will be contacted by the cardiologist office for further work-up and treatment of your atrial fibrillation.  If you do not hear from them by next week, please call.  Please return to the emergency department if you develop any new or worsening symptoms.

## 2018-08-19 ENCOUNTER — Encounter: Payer: Medicaid Other | Admitting: Surgery

## 2018-08-19 MED FILL — WARFARIN SODIUM 5 MG TABLET: 5 | 30 days supply | Qty: 45 | Fill #2

## 2018-08-20 ENCOUNTER — Emergency Department (HOSPITAL_COMMUNITY): Payer: Medicare Other

## 2018-08-20 ENCOUNTER — Other Ambulatory Visit: Payer: Self-pay

## 2018-08-20 ENCOUNTER — Encounter (HOSPITAL_COMMUNITY): Payer: Self-pay | Admitting: Emergency Medicine

## 2018-08-20 ENCOUNTER — Emergency Department (HOSPITAL_COMMUNITY)
Admission: EM | Admit: 2018-08-20 | Discharge: 2018-08-21 | Disposition: A | Discharge status: Home / Self Care | Payer: Medicare Other | Attending: Emergency Medicine | Admitting: Emergency Medicine

## 2018-08-20 ENCOUNTER — Telehealth (HOSPITAL_COMMUNITY): Payer: Self-pay | Admitting: *Deleted

## 2018-08-20 DIAGNOSIS — N186 End stage renal disease: Secondary | ICD-10-CM | POA: Insufficient documentation

## 2018-08-20 DIAGNOSIS — I5022 Chronic systolic (congestive) heart failure: Secondary | ICD-10-CM | POA: Insufficient documentation

## 2018-08-20 DIAGNOSIS — R0602 Shortness of breath: Secondary | ICD-10-CM | POA: Diagnosis present

## 2018-08-20 DIAGNOSIS — I132 Hypertensive heart and chronic kidney disease with heart failure and with stage 5 chronic kidney disease, or end stage renal disease: Secondary | ICD-10-CM | POA: Insufficient documentation

## 2018-08-20 DIAGNOSIS — D631 Anemia in chronic kidney disease: Secondary | ICD-10-CM | POA: Insufficient documentation

## 2018-08-20 DIAGNOSIS — Z79899 Other long term (current) drug therapy: Secondary | ICD-10-CM | POA: Diagnosis not present

## 2018-08-20 DIAGNOSIS — Z95811 Presence of heart assist device: Secondary | ICD-10-CM | POA: Diagnosis not present

## 2018-08-20 DIAGNOSIS — I4891 Unspecified atrial fibrillation: Secondary | ICD-10-CM

## 2018-08-20 DIAGNOSIS — Z992 Dependence on renal dialysis: Secondary | ICD-10-CM | POA: Diagnosis not present

## 2018-08-20 DIAGNOSIS — E1122 Type 2 diabetes mellitus with diabetic chronic kidney disease: Secondary | ICD-10-CM | POA: Insufficient documentation

## 2018-08-20 DIAGNOSIS — N189 Chronic kidney disease, unspecified: Secondary | ICD-10-CM

## 2018-08-20 DIAGNOSIS — Z7901 Long term (current) use of anticoagulants: Secondary | ICD-10-CM | POA: Diagnosis not present

## 2018-08-20 DIAGNOSIS — Z794 Long term (current) use of insulin: Secondary | ICD-10-CM | POA: Diagnosis not present

## 2018-08-20 LAB — CBC
HCT: 29 % — ABNORMAL LOW (ref 39.0–52.0)
Hemoglobin: 9.1 g/dL — ABNORMAL LOW (ref 13.0–17.0)
MCH: 29.4 pg (ref 26.0–34.0)
MCHC: 31.4 g/dL (ref 30.0–36.0)
MCV: 93.9 fL (ref 80.0–100.0)
Platelets: 249 10*3/uL (ref 150–400)
RBC: 3.09 MIL/uL — ABNORMAL LOW (ref 4.22–5.81)
RDW: 14.6 % (ref 11.5–15.5)
WBC: 6 10*3/uL (ref 4.0–10.5)
nRBC: 0 % (ref 0.0–0.2)

## 2018-08-20 LAB — COMPREHENSIVE METABOLIC PANEL
ALT: 41 U/L (ref 0–44)
AST: 35 U/L (ref 15–41)
Albumin: 2.8 g/dL — ABNORMAL LOW (ref 3.5–5.0)
Alkaline Phosphatase: 160 U/L — ABNORMAL HIGH (ref 38–126)
Anion gap: 12 (ref 5–15)
BUN: 19 mg/dL (ref 6–20)
CO2: 29 mmol/L (ref 22–32)
Calcium: 8.6 mg/dL — ABNORMAL LOW (ref 8.9–10.3)
Chloride: 96 mmol/L — ABNORMAL LOW (ref 98–111)
Creatinine, Ser: 7.39 mg/dL — ABNORMAL HIGH (ref 0.61–1.24)
GFR calc Af Amer: 9 mL/min — ABNORMAL LOW (ref >60)
GFR calc non Af Amer: 8 mL/min — ABNORMAL LOW (ref >60)
Glucose, Bld: 188 mg/dL — ABNORMAL HIGH (ref 70–99)
Potassium: 3.5 mmol/L (ref 3.5–5.1)
Sodium: 137 mmol/L (ref 135–145)
Total Bilirubin: 1.8 mg/dL — ABNORMAL HIGH (ref 0.3–1.2)
Total Protein: 7.3 g/dL (ref 6.5–8.1)

## 2018-08-20 LAB — LIPASE, BLOOD: Lipase: 27 U/L (ref 11–51)

## 2018-08-20 MED ORDER — SODIUM CHLORIDE 0.9% FLUSH
3.0000 mL | Freq: Once | INTRAVENOUS | Status: AC
  Administered 2018-08-21: 3 mL via INTRAVENOUS

## 2018-08-20 NOTE — ED Triage Notes (Signed)
Pt c/o shortness of breath and generalized abdominal pain x 2 days. Pt does dialysis Tues/Thurs/Sat. States that he had his full dialysis treatment today. Hx afib

## 2018-08-20 NOTE — Telephone Encounter (Signed)
Pt refused appt with afib clinic and refused number to schedule with Dr. Percival Spanish.  Pt only wanted to see Dr. Aundra Dubin.  Msg was sent to scheduler for HF clinic to get pt appt.

## 2018-08-21 ENCOUNTER — Other Ambulatory Visit: Payer: Self-pay

## 2018-08-21 DIAGNOSIS — I4891 Unspecified atrial fibrillation: Secondary | ICD-10-CM | POA: Diagnosis not present

## 2018-08-21 LAB — PROTIME-INR
INR: 5.1 (ref 0.8–1.2)
Prothrombin Time: 46.1 seconds — ABNORMAL HIGH (ref 11.4–15.2)

## 2018-08-21 MED ORDER — METOPROLOL TARTRATE 5 MG/5ML IV SOLN
2.5000 mg | Freq: Once | INTRAVENOUS | Status: AC
  Administered 2018-08-21: 2.5 mg via INTRAVENOUS
  Filled 2018-08-21: qty 5

## 2018-08-21 MED ORDER — METOPROLOL TARTRATE 25 MG PO TABS: 25.0000 mg | ORAL_TABLET | Freq: Two times a day (BID) | ORAL | 0 refills | Status: DC

## 2018-08-21 MED FILL — METOPROLOL TARTRATE 25 MG T: 25 | 30 days supply | Qty: 60 | Fill #0

## 2018-08-21 NOTE — Discharge Instructions (Signed)
Increase your metoprolol dose to 25 mg (one full tablet) twice a day.

## 2018-08-21 NOTE — ED Provider Notes (Signed)
[facility] EMERGENCY DEPARTMENT Provider Note   CSN: [id number] Arrival date & time: 08/20/18  2219    History   Chief Complaint Chief Complaint  Patient presents with   Shortness of Breath   Abdominal Pain    HPI Travis Bryan is a 48 y.o. male.    Shortness of Breath  Associated symptoms: abdominal pain   Abdominal Pain  Associated symptoms: shortness of breath   He has history of systolic heart failure, hypertension, hyperlipidemia, diabetes, end-stage renal disease on hemodialysis, atrial fibrillation anticoagulated on warfarin and comes in because of difficulty breathing.  He has been having difficulty for about the last week.  Symptoms are primarily exertional on walking about 25-50 feet.  Symptoms have been stable, but tonight, he noted that he was uncomfortable laying on the sofa.  Denies chest pain, heaviness, tightness, pressure.  Denies abdominal pain.  He had been seen in the ED recently with atrial fibrillation and was put on metoprolol 12.5 mg twice a day.  He denies cough or fever.  He denies nausea, vomiting, diarrhea.  Past Medical History:  Diagnosis Date   AICD (automatic cardioverter/defibrillator) present    Atrial flutter ([place])    a. s/p ablation 03/2016   Chronic kidney disease    Chronic systolic CHF (congestive heart failure) ([place])    HCAP (healthcare-associated pneumonia) 03/28/2018   History of gout    Hyperlipidemia    Hypertension    Myocardial infarction ([place])    "I think I had one a long long time ago" (03/28/2016)   NICM (nonischemic cardiomyopathy) ([place])    a. LHC 6/06: pLAD 20, pLCx 20-30; b. Echo 5/15:  EF 15%, diffuse HK, restrictive physiology, trivial AI, trivial MR, mild LAE, moderate RVE, moderately reduced RVSF, moderate RAE, mild to moderate TR, PASP 43 mmHg   Obesity    Persistent atrial fibrillation    Stroke ([place])    Type II diabetes mellitus ([place])     Patient Active Problem List   Diagnosis  Date Noted   Acute renal failure superimposed on stage 4 chronic kidney disease ([place]) 05/01/2018   Debility 04/26/2018   Hypoxia    Post-operative state    Anemia of chronic disease    PAF (paroxysmal atrial fibrillation) (HCC)    Pressure injury of skin 04/17/2018   Bilateral pneumonia 03/29/2018   Acute respiratory failure with hypoxia ([place]) 03/28/2018   DKA (diabetic ketoacidosis) ([place]) 02/25/2018   Acute on chronic systolic heart failure, NYHA class 4 ([place]) 02/25/2018   Hyperlipidemia 02/25/2018   Atrial fibrillation ([place]) 02/25/2018   CAD (coronary artery disease) 02/25/2018   Elevated troponin 02/25/2018   Sepsis due to pneumonia ([place]) 02/25/2018   AKI (acute kidney injury) ([place]) 02/25/2018   Type 2 diabetes mellitus with complication, with long-term current use of insulin ([place]) 12/26/2017   At risk for sexually transmitted disease due to unprotected sex 08/23/2017   Healthcare maintenance 06/24/2016   Persistent atrial fibrillation    CARDIOMYOPATHY, SECONDARY 07/03/2008   HFrEF (heart failure with reduced ejection fraction) ([place]) 07/03/2008   Automatic implantable cardioverter-defibrillator in situ 07/03/2008   Type 2 diabetes mellitus ([place]) 12/26/2005   Hyperlipidemia associated with type 2 diabetes mellitus ([place]) 12/26/2005   Hypertension associated with diabetes ([place]) 12/26/2005    Past Surgical History:  Procedure Laterality Date   AV FISTULA PLACEMENT Left 04/25/2018   Procedure: ARTERIOVENOUS (AV) VS GRAFT ARM;  Surgeon: Serafina Mitchell, MD;  Location: [facility];  Service: Vascular;  Laterality: Left;   BASCILIC VEIN TRANSPOSITION Left 07/19/2018   Procedure: SECOND STAGE BASILIC VEIN TRANSPOSITION LEFT ARM;  Surgeon: Serafina Mitchell, MD;  Location: [facility];  Service: Vascular;  Laterality: Left;   CARDIOVERSION N/A 04/07/2016   Procedure: CARDIOVERSION;  Surgeon: Thayer Headings, MD;  Location: [facility];  Service: Cardiovascular;   Laterality: N/A;   CARDIOVERSION N/A 04/11/2016   Procedure: CARDIOVERSION;  Surgeon: Larey Dresser, MD;  Location: [facility];  Service: Cardiovascular;  Laterality: N/A;   ELECTROPHYSIOLOGIC STUDY N/A 03/31/2016   Procedure: A-Flutter Ablation;  Surgeon: Evans Lance, MD;  Location: [facility] CV LAB;  Service: Cardiovascular;  Laterality: N/A;   EXCHANGE OF A DIALYSIS CATHETER Left 04/25/2018   Procedure: Attempted Exchange Of A Dialysis Catheter on the Left side;  Surgeon: Serafina Mitchell, MD;  Location: [facility];  Service: Vascular;  Laterality: Left;   IMPLANTABLE CARDIOVERTER DEFIBRILLATOR (ICD) GENERATOR CHANGE N/A 08/27/2013   Procedure: ICD GENERATOR CHANGE;  Surgeon: Evans Lance, MD;  Location[facility] C[facility];  Service: Cardiovascular;  Laterality: N/A;   INSERTION OF DIALYSIS CATHETER Right 04/25/2018   Procedure: INSERTION OF TUNNELED DIALYSIS CATHETER;  Surgeon: Serafina Mitchell, MD;  Location: [facility] OR;  Service: Vascular;  Laterality: Right;   TEE WITHOUT CARDIOVERSION N/A 03/31/2016   Procedure: TRANSESOPHAGEAL ECHOCARDIOGRAM (TEE);  Surgeon: Larey Dresser, MD;  Location: [facility];  Service: Cardiovascular;  Laterality: N/A;        Home Medications    Prior to Admission medications   Medication Sig Start Date End Date Taking? Authorizing Provider  acetaminophen (TYLENOL) 500 MG tablet Take 1,000 mg by mouth every 6 (six) hours as needed for mild pain or headache.    [provider]  allopurinol (ZYLOPRIM) 100 MG tablet Take 1 tablet (100 mg total) by mouth daily. Patient not taking: Reported on 08/16/2018 05/04/18   Love, Ivan Anchors, PA-C  amiodarone (PACERONE) 200 MG tablet Take 1 tablet (200 mg total) by mouth daily. 05/30/18   Larey Dresser, MD  atorvastatin (LIPITOR) 40 MG tablet Take 1 tablet (40 mg total) by mouth daily at 6 PM. 05/13/18   [name], FNP  HYDROcodone-acetaminophen (NORCO) 5-325 MG tablet Take 1 tablet by mouth every 6 (six) hours  as needed for moderate pain. Patient not taking: Reported on 08/16/2018 07/19/18   Dagoberto Ligas, PA-C  insulin aspart (NOVOLOG) 100 UNIT/ML injection Use 3 units three times a day with meals Patient taking differently: Inject 0-3 Units into the skin 3 (three) times daily with meals. Use 3 units three times a day with meals per sliding scale. 05/13/18 05/13/19  [name], FNP  insulin glargine (LANTUS) 100 UNIT/ML injection Inject 0.22 mLs (22 Units total) into the skin at bedtime. Patient taking differently: Inject 22 Units into the skin at bedtime as needed (if BS is above 140 or higher).  05/13/18   [name], FNP  Insulin Syringe-Needle U-100 31G X 15/64" 0.3 ML MISC Use to inject insulin 4 times a day 05/13/18   [name], FNP  metoprolol tartrate (LOPRESSOR) 25 MG tablet Take 0.5 tablets (12.5 mg total) by mouth 2 (two) times daily. 08/16/18   Law, Bea Graff, PA-C  multivitamin (RENA-VIT) TABS tablet TAKE 1 TABLET BY MOUTH AT BEDTIME. 07/29/18   [name], FNP  sevelamer carbonate (RENVELA) 800 MG tablet Take 1 tablet (800 mg total) by mouth 3 (three) times daily with meals. 05/13/18  [name], FNP  warfarin (COUMADIN) 5 MG tablet Take As Directed by [facility] Patient taking differently: Take 5-7.5 mg by mouth See admin instructions. Take As Directed by [facility] 5 mg (5 mg x 1) every Tue, Thu, Sat; 7.5 mg (5 mg x 1.5) all other days 05/24/18   Larey Dresser, MD    Family History Family History  Problem Relation Age of Onset   Hypertension Mother    Heart disease Mother    Diabetes Mother     Social History Social History   Tobacco Use   Smoking status: Never Smoker   Smokeless tobacco: Never Used   Tobacco comment: pt denies smoking cigars  Substance Use Topics   Alcohol use: No   Drug use: No     Allergies   No known allergies   Review of Systems Review of Systems  Respiratory: Positive for shortness of breath.    Gastrointestinal: Positive for abdominal pain.  All other systems reviewed and are negative.    Physical Exam Updated Vital Signs BP 102/75    Pulse (!) 115    Temp 98.8 F (37.1 C) (Oral)    Resp (!) 27    Ht 5\' 10"  (1.778 m)    Wt 97.5 kg    SpO2 94%    BMI 30.84 kg/m   Physical Exam Vitals signs and nursing note reviewed.    48 year old male, resting comfortably and in no acute distress. Vital signs are significant for rapid heart rate and rapid respiratory rate. Oxygen saturation is 94%, which is normal. Head is normocephalic and atraumatic. PERRLA, EOMI. Oropharynx is clear. Neck is nontender and supple without adenopathy.  JVD is present at 90 degrees. Back is nontender and there is no CVA tenderness. Lungs are have diminished breath sounds at both bases without rales, wheezes, or rhonchi. Chest is nontender. Heart has regular rate and rhythm without murmur. Abdomen is soft, flat, nontender without masses or hepatosplenomegaly and peristalsis is normoactive. Extremities have trace edema, full range of motion is present.  AV fistula present left arm with thrill present. Skin is warm and dry without rash. Neurologic: Mental status is normal, cranial nerves are intact, there are no motor or sensory deficits.  ED Treatments / Results  Labs (all labs ordered are listed, but only abnormal results are displayed) Labs Reviewed  COMPREHENSIVE METABOLIC PANEL - Abnormal; Notable for the following components:      Result Value   Chloride 96 (*)    Glucose, Bld 188 (*)    Creatinine, Ser 7.39 (*)    Calcium 8.6 (*)    Albumin 2.8 (*)    Alkaline Phosphatase 160 (*)    Total Bilirubin 1.8 (*)    GFR calc non Af Amer 8 (*)    GFR calc Af Amer 9 (*)    All other components within normal limits  CBC - Abnormal; Notable for the following components:   RBC 3.09 (*)    Hemoglobin 9.1 (*)    HCT 29.0 (*)    All other components within normal limits  PROTIME-INR - Abnormal;  Notable for the following components:   Prothrombin Time 46.1 (*)    INR 5.1 (*)    All other components within normal limits  LIPASE, BLOOD    EKG EKG Interpretation  Date/Time:  Tuesday August 20 2018 22:30:17 EDT Ventricular Rate:  118 PR Interval:    QRS Duration: 190 QT Interval:  350 QTC Calculation: 490  R Axis:   -77 Text Interpretation:  ** Poor data quality, interpretation may be adversely affected Atrial fibrillation with rapid ventricular response Non-specific intra-ventricular conduction block Lateral infarct , age undetermined Abnormal ECG When compared with ECG of 08/16/2018, No significant change was found Reconfirmed by Delora Fuel ([id number]) on 08/21/2018 3:42:04 AM   Radiology Dg Chest 2 View  Result Date: 08/20/2018 CLINICAL DATA:  Shortness of breath EXAM: CHEST - 2 VIEW COMPARISON:  08/16/2018, 04/25/2018, 04/18/2018, 04/01/2016 FINDINGS: No pleural effusion. Stable cardiomegaly. Left-sided pacing device as before. Right-sided central venous catheter tip over the proximal right atrium. Faint interstitial opacity at the right greater than left bases appear chronic. No pneumothorax. IMPRESSION: No active cardiopulmonary disease.  Cardiomegaly. Electronically Signed   By: Donavan Foil M.D.   On: 08/20/2018 23:18    Procedures Procedures  CRITICAL CARE Performed by: Delora Fuel Total critical care time: 35 minutes Critical care time was exclusive of separately billable procedures and treating other patients. Critical care was necessary to treat or prevent imminent or life-threatening deterioration. Critical care was time spent personally by me on the following activities: development of treatment plan with patient and/or surrogate as well as nursing, discussions with consultants, evaluation of patient's response to treatment, examination of patient, obtaining history from patient or surrogate, ordering and performing treatments and interventions, ordering and review of  laboratory studies, ordering and review of radiographic studies, pulse oximetry and re-evaluation of patient's condition.  Medications Ordered in ED Medications  sodium chloride flush (NS) 0.9 % injection 3 mL (has no administration in time range)  metoprolol tartrate (LOPRESSOR) injection 2.5 mg (has no administration in time range)     Initial Impression / Assessment and Plan / ED Course  I have reviewed the triage vital signs and the nursing notes.  Pertinent labs & imaging results that were available during my care of the patient were reviewed by me and considered in my medical decision making (see chart for details).  Dyspnea and patient with known history of end-stage renal disease, heart failure, atrial fibrillation.  ECG shows atrial fibrillation with rapid ventricular response.  Chest x-ray does not show evidence of pulmonary vascular congestion or pulmonary edema.  Old records are reviewed, and he had a similar presentation on June 5 at which time he was sent home with prescription for metoprolol 12.5 mg twice daily for rate control.  At this time, no other reason for dyspnea obvious other than rapid ventricular response.  He will be given a dose of metoprolol and reassessed.  Heart rate has come down satisfactorily with metoprolol.  He is discharged with instructions to increase his metoprolol dose to 25 mg twice daily.  Follow-up with PCP in 1 week.  Final Clinical Impressions(s) / ED Diagnoses   Final diagnoses:  Atrial fibrillation with rapid ventricular response (HCC)  End-stage renal disease on hemodialysis ([place])  Anemia associated with chronic renal failure    ED Discharge Orders         Ordered    metoprolol tartrate (LOPRESSOR) 25 MG tablet  2 times daily     08/21/18 0623           Delora Fuel, MD 76/28/31 [phone]

## 2018-08-22 ENCOUNTER — Telehealth: Payer: Self-pay

## 2018-08-22 ENCOUNTER — Encounter: Payer: Self-pay | Admitting: Family

## 2018-08-22 NOTE — Telephone Encounter (Signed)

## 2018-08-26 ENCOUNTER — Ambulatory Visit
Admission: EM | Admit: 2018-08-26 | Discharge: 2018-08-26 | Disposition: A | Discharge status: Home / Self Care | Payer: Medicare Other | Attending: Emergency Medicine | Admitting: Emergency Medicine

## 2018-08-26 ENCOUNTER — Other Ambulatory Visit: Payer: Self-pay

## 2018-08-26 DIAGNOSIS — K612 Anorectal abscess: Secondary | ICD-10-CM

## 2018-08-26 MED ORDER — CLINDAMYCIN HCL 300 MG PO CAPS: 300.0000 mg | ORAL_CAPSULE | Freq: Three times a day (TID) | ORAL | 0 refills | Status: AC

## 2018-08-26 MED FILL — CLINDAMYCIN HCL 300 MG CAP: 300 | 7 days supply | Qty: 21 | Fill #0

## 2018-08-26 NOTE — ED Provider Notes (Signed)
[facility]    CSN: [id number] Arrival date & time: 08/26/18  1340     History   Chief Complaint Chief Complaint  Patient presents with  . Abscess    HPI Travis Bryan is a 48 y.o. male.   HPI Travis Bryan is a 48 y.o. male presenting to UC with c/o abscess in his rectal area that started draining blood and pus yesterday. Pain is aching and sore, 6/10.  He has not taken any medication or tried any creams for his symptoms.  Denies fever, chills, n/v/d. Oxygen is low in triage. Pt denies SOB. Hx of CHF. Per medical records, pt's O2 frequently hovers around 90% on RA. Pt is not on oxygen at home.     Past Medical History:  Diagnosis Date  . AICD (automatic cardioverter/defibrillator) present   . Atrial flutter ([place])    a. s/p ablation 03/2016  . Chronic kidney disease   . Chronic systolic CHF (congestive heart failure) ([place])   . HCAP (healthcare-associated pneumonia) 03/28/2018  . History of gout   . Hyperlipidemia   . Hypertension   . Myocardial infarction [facility])    "I think I had one a long long time ago" (03/28/2016)  . NICM (nonischemic cardiomyopathy) ([place])    a. LHC 6/06: pLAD 20, pLCx 20-30; b. Echo 5/15:  EF 15%, diffuse HK, restrictive physiology, trivial AI, trivial MR, mild LAE, moderate RVE, moderately reduced RVSF, moderate RAE, mild to moderate TR, PASP 43 mmHg  . Obesity   . Persistent atrial fibrillation   . Stroke ([place])   . Type II diabetes mellitus [facility])     Patient Active Problem List   Diagnosis Date Noted  . Acute renal failure superimposed on stage 4 chronic kidney disease ([place]) 05/01/2018  . Debility 04/26/2018  . Hypoxia   . Post-operative state   . Anemia of chronic disease   . PAF (paroxysmal atrial fibrillation) ([place])   . Pressure injury of skin 04/17/2018  . Bilateral pneumonia 03/29/2018  . Acute respiratory failure with hypoxia ([place]) 03/28/2018  . DKA (diabetic ketoacidosis) ([place]) 02/25/2018  . Acute on chronic systolic  heart failure, NYHA class 4 ([place]) 02/25/2018  . Hyperlipidemia 02/25/2018  . Atrial fibrillation ([place]) 02/25/2018  . CAD (coronary artery disease) 02/25/2018  . Elevated troponin 02/25/2018  . Sepsis due to pneumonia ([place]) 02/25/2018  . AKI (acute kidney injury) ([place]) 02/25/2018  . Type 2 diabetes mellitus with complication, with long-term current use of insulin ([place]) 12/26/2017  . At risk for sexually transmitted disease due to unprotected sex 08/23/2017  . Healthcare maintenance 06/24/2016  . Persistent atrial fibrillation   . CARDIOMYOPATHY, SECONDARY 07/03/2008  . HFrEF (heart failure with reduced ejection fraction) ([place]) 07/03/2008  . Automatic implantable cardioverter-defibrillator in situ 07/03/2008  . Type 2 diabetes mellitus ([place]) 12/26/2005  . Hyperlipidemia associated with type 2 diabetes mellitus ([place]) 12/26/2005  . Hypertension associated with diabetes ([place]) 12/26/2005    Past Surgical History:  Procedure Laterality Date  . AV FISTULA PLACEMENT Left 04/25/2018   Procedure: ARTERIOVENOUS (AV) VS GRAFT ARM;  Surgeon: Serafina Mitchell, MD;  Location: [place];  Service: Vascular;  Laterality: Left;  . BASCILIC VEIN TRANSPOSITION Left 07/19/2018   Procedure: SECOND STAGE BASILIC VEIN TRANSPOSITION LEFT ARM;  Surgeon: Serafina Mitchell, MD;  Location: [place];  Service: Vascular;  Laterality: Left;  . CARDIOVERSION N/A 04/07/2016   Procedure: CARDIOVERSION;  Surgeon: Thayer Headings, MD;  Location: [place];  Service: Cardiovascular;  Laterality: N/A;  . CARDIOVERSION N/A 04/11/2016   Procedure: CARDIOVERSION;  Surgeon: Larey Dresser, MD;  Location: [place];  Service: Cardiovascular;  Laterality: N/A;  . ELECTROPHYSIOLOGIC STUDY N/A 03/31/2016   Procedure: A-Flutter Ablation;  Surgeon: Evans Lance, MD;  Location: [place] CV LAB;  Service: Cardiovascular;  Laterality: N/A;  . EXCHANGE OF A DIALYSIS CATHETER Left 04/25/2018   Procedure: Attempted Exchange Of A Dialysis  Catheter on the Left side;  Surgeon: Serafina Mitchell, MD;  Location: [facility] OR;  Service: Vascular;  Laterality: Left;  . IMPLANTABLE CARDIOVERTER DEFIBRILLATOR (ICD) GENERATOR CHANGE N/A 08/27/2013   Procedure: ICD GENERATOR CHANGE;  Surgeon: Evans Lance, MD;  Location[facility] C[facility] LAB;  Service: Cardiovascular;  Laterality: N/A;  . INSERTION OF DIALYSIS CATHETER Right 04/25/2018   Procedure: INSERTION OF TUNNELED DIALYSIS CATHETER;  Surgeon: Serafina Mitchell, MD;  [facility] OR;  Service: Vascular;  Laterality: Right;  . TEE WITHOUT CARDIOVERSION N/A 03/31/2016   Procedure: TRANSESOPHAGEAL ECHOCARDIOGRAM (TEE);  Surgeon: Larey Dresser, MD;  Location: [place];  Service: Cardiovascular;  Laterality: N/A;       Home Medications    Prior to Admission medications   Medication Sig Start Date End Date Taking? Authorizing Provider  acetaminophen (TYLENOL) 500 MG tablet Take 1,000 mg by mouth every 6 (six) hours as needed for mild pain or headache.    [provider]  allopurinol (ZYLOPRIM) 100 MG tablet Take 1 tablet (100 mg total) by mouth daily. Patient not taking: Reported on 08/16/2018 05/04/18   Love, Ivan Anchors, PA-C  amiodarone (PACERONE) 200 MG tablet Take 1 tablet (200 mg total) by mouth daily. 05/30/18   Larey Dresser, MD  atorvastatin (LIPITOR) 40 MG tablet Take 1 tablet (40 mg total) by mouth daily at 6 PM. 05/13/18   [name], FNP  clindamycin (CLEOCIN) 300 MG capsule Take 1 capsule (300 mg total) by mouth 3 (three) times daily for 7 days. 08/26/18 09/02/18  Noe Gens, PA-C  insulin aspart (NOVOLOG) 100 UNIT/ML injection Use 3 units three times a day with meals Patient taking differently: Inject 0-3 Units into the skin 3 (three) times daily with meals. Use 3 units three times a day with meals per sliding scale. 05/13/18 05/13/19  [name], FNP  insulin glargine (LANTUS) 100 UNIT/ML injection Inject 0.22 mLs (22 Units total) into the skin at bedtime. Patient  taking differently: Inject 22 Units into the skin at bedtime as needed (if BS is above 140 or higher).  05/13/18   [name], FNP  Insulin Syringe-Needle U-100 31G X 15/64" 0.3 ML MISC Use to inject insulin 4 times a day 05/13/18   [name], FNP  metoprolol tartrate (LOPRESSOR) 25 MG tablet Take 1 tablet (25 mg total) by mouth 2 (two) times daily. 06/22/85   Delora Fuel, MD  multivitamin (RENA-VIT) TABS tablet TAKE 1 TABLET BY MOUTH AT BEDTIME. 07/29/18   [name], FNP  sevelamer carbonate (RENVELA) 800 MG tablet Take 1 tablet (800 mg total) by mouth 3 (three) times daily with meals. 05/13/18   [name], FNP  warfarin (COUMADIN) 5 MG tablet Take As Directed by Coumadin Clinic Patient taking differently: Take 5-7.5 mg by mouth See admin instructions. Take As Directed by Coumadin Clinic 5 mg (5 mg x 1) every Tue, Thu, Sat; 7.5 mg (5 mg x 1.5) all other days 05/24/18   Larey Dresser, MD    Family History  Family History  Problem Relation Age of Onset  . Hypertension Mother   . Heart disease Mother   . Diabetes Mother     Social History Social History   Tobacco Use  . Smoking status: Never Smoker  . Smokeless tobacco: Never Used  . Tobacco comment: pt denies smoking cigars  Substance Use Topics  . Alcohol use: No  . Drug use: No     Allergies   No known allergies   Review of Systems Review of Systems  Constitutional: Negative for chills and fever.  Respiratory: Negative for chest tightness, shortness of breath and wheezing.   Cardiovascular: Negative for chest pain.  Gastrointestinal: Positive for anal bleeding and rectal pain. Negative for abdominal pain, diarrhea, nausea and vomiting.  Genitourinary: Negative for dysuria and frequency.  Musculoskeletal: Negative for back pain and myalgias.     Physical Exam Triage Vital Signs ED Triage Vitals  Enc Vitals Group     BP 08/26/18 1401 (!) 93/59     Pulse Rate 08/26/18 1401 95     Resp  08/26/18 1401 16     Temp 08/26/18 1401 98.3 F (36.8 C)     Temp Source 08/26/18 1401 Oral     SpO2 08/26/18 1401 90 %     Weight --      Height --      Head Circumference --      Peak Flow --      Pain Score 08/26/18 1359 6     Pain Loc --      Pain Edu? --      Excl. in [place]? --    No data found.  Updated Vital Signs BP (!) 93/59 (BP Location: Right Arm)   Pulse 95   Temp 98.3 F (36.8 C) (Oral)   Resp 16   SpO2 90%   Visual Acuity Right Eye Distance:   Left Eye Distance:   Bilateral Distance:    Right Eye Near:   Left Eye Near:    Bilateral Near:     Physical Exam Vitals signs and nursing note reviewed. Exam conducted with a chaperone present.  Constitutional:      Appearance: Normal appearance. He is well-developed.  HENT:     Head: Normocephalic and atraumatic.  Neck:     Musculoskeletal: Normal range of motion.  Cardiovascular:     Rate and Rhythm: Normal rate and regular rhythm.  Pulmonary:     Effort: Pulmonary effort is normal. No respiratory distress.     Breath sounds: Normal breath sounds.  Abdominal:     Palpations: Abdomen is soft.     Tenderness: There is no abdominal tenderness.  Genitourinary:    Rectum: Tenderness and external hemorrhoid (2cm, draining scant blood and pus ) present.    Musculoskeletal: Normal range of motion.  Skin:    General: Skin is warm and dry.  Neurological:     Mental Status: He is alert and oriented to person, place, and time.  Psychiatric:        Behavior: Behavior normal.      UC Treatments / Results  Labs (all labs ordered are listed, but only abnormal results are displayed) Labs Reviewed - No data to display  EKG None  Radiology No results found.  Procedures Procedures (including critical care time)  Medications Ordered in UC Medications - No data to display  Initial Impression / Assessment and Plan / UC Course  I have reviewed the triage vital signs and the nursing  notes.  Pertinent labs  & imaging results that were available during my care of the patient were reviewed by me and considered in my medical decision making (see chart for details).     spontaneously draining anal abscess. Appears to be an infected external hemorrhoid Will start on clindamycin Encouraged sitz baths F/u in 2-3 days as needed   Final Clinical Impressions(s) / UC Diagnoses   Final diagnoses:  Abscess of anal and rectal regions     Discharge Instructions      Please take antibiotics as prescribed and be sure to complete entire course even if you start to feel better to ensure infection does not come back.  Please follow up with family medicine in 2-3 days if not improving.  Call 911 or go to the emergency department if symptoms worsening as you may need the abscess drainage and/or IV antibiotics.    ED Prescriptions    Medication Sig Dispense Auth. Provider   clindamycin (CLEOCIN) 300 MG capsule Take 1 capsule (300 mg total) by mouth 3 (three) times daily for 7 days. 21 capsule Noe Gens, PA-C     Controlled Substance Prescriptions [place] Controlled Substance Registry consulted? Not Applicable   Tyrell Antonio 08/26/18 1561

## 2018-08-26 NOTE — Discharge Instructions (Signed)
°  Please take antibiotics as prescribed and be sure to complete entire course even if you start to feel better to ensure infection does not come back.  Please follow up with family medicine in 2-3 days if not improving.  Call 911 or go to the emergency department if symptoms worsening as you may need the abscess drainage and/or IV antibiotics.

## 2018-08-26 NOTE — ED Triage Notes (Signed)
Pt c/o abscess to buttocks x3 days

## 2018-08-27 ENCOUNTER — Telehealth: Payer: Self-pay

## 2018-08-27 NOTE — Telephone Encounter (Signed)
Called patient to do their pre-visit COVID screening.  Have you recently traveled internationally([place], [place], [place], [place], [place]) or within the [place] to a hotspot area([place], [place], [place], [place], [place])? no  Are you currently experiencing any of the following: fever, cough, SHOB, fatigue, body aches, loss of smell, rash, diarrhea, vomiting, severe headaches, weakness, sore throat? no  Have you been in contact with anyone who has recently travelled? no  Have you been in contact with anyone who is experiencing any of the above symptoms or been diagnosed with COVID  or works in or has recently visited a SNF? no

## 2018-08-28 ENCOUNTER — Encounter: Payer: Self-pay | Admitting: Family Medicine

## 2018-08-28 ENCOUNTER — Other Ambulatory Visit: Payer: Self-pay

## 2018-08-28 ENCOUNTER — Ambulatory Visit (INDEPENDENT_AMBULATORY_CARE_PROVIDER_SITE_OTHER): Payer: Medicare Other | Admitting: Family Medicine

## 2018-08-28 ENCOUNTER — Ambulatory Visit (INDEPENDENT_AMBULATORY_CARE_PROVIDER_SITE_OTHER): Payer: Medicaid Other | Admitting: Pharmacist

## 2018-08-28 VITALS — BP 87/58 | HR 106 | Temp 99.0°F | Resp 17 | Ht 72.0 in | Wt 208.6 lb

## 2018-08-28 DIAGNOSIS — N186 End stage renal disease: Secondary | ICD-10-CM | POA: Diagnosis not present

## 2018-08-28 DIAGNOSIS — I480 Paroxysmal atrial fibrillation: Secondary | ICD-10-CM | POA: Diagnosis not present

## 2018-08-28 DIAGNOSIS — I4819 Other persistent atrial fibrillation: Secondary | ICD-10-CM | POA: Diagnosis not present

## 2018-08-28 DIAGNOSIS — Z794 Long term (current) use of insulin: Secondary | ICD-10-CM

## 2018-08-28 DIAGNOSIS — Z5181 Encounter for therapeutic drug level monitoring: Secondary | ICD-10-CM

## 2018-08-28 DIAGNOSIS — Z992 Dependence on renal dialysis: Secondary | ICD-10-CM

## 2018-08-28 DIAGNOSIS — I483 Typical atrial flutter: Secondary | ICD-10-CM

## 2018-08-28 DIAGNOSIS — I952 Hypotension due to drugs: Secondary | ICD-10-CM

## 2018-08-28 DIAGNOSIS — Z95810 Presence of automatic (implantable) cardiac defibrillator: Secondary | ICD-10-CM | POA: Diagnosis not present

## 2018-08-28 DIAGNOSIS — E1122 Type 2 diabetes mellitus with diabetic chronic kidney disease: Secondary | ICD-10-CM | POA: Diagnosis not present

## 2018-08-28 LAB — PROTIME-INR
INR: >10 (ref 0.8–1.2)
Prothrombin Time: >90 seconds — ABNORMAL HIGH (ref 11.4–15.2)

## 2018-08-28 LAB — POCT INR: INR: 8 — AB (ref 2.0–3.0)

## 2018-08-28 NOTE — Patient Instructions (Signed)
Description   Do not take any warfarin until we call you. Go to the ER with any bleeding.

## 2018-08-28 NOTE — Progress Notes (Signed)
Patient ID: Travis Bryan, male    DOB: 07-03-1970, 48 y.o.   MRN: [id number]  PCP: [name], FNP  Chief Complaint  Patient presents with  . Diabetes  . Hypertension  . Hyperlipidemia    Subjective:  HPI  Travis Bryan is a 48 y.o. male presents for evaluation diabetes follow-up.  has Type 2 diabetes mellitus ([place]); Hyperlipidemia associated with type 2 diabetes mellitus ([place]); Hypertension associated with diabetes ([place]); CARDIOMYOPATHY, SECONDARY; HFrEF (heart failure with reduced ejection fraction) ([place]); Automatic implantable cardioverter-defibrillator in situ; Persistent atrial fibrillation; Healthcare maintenance; At risk for sexually transmitted disease due to unprotected sex; Type 2 diabetes mellitus with complication, with long-term current use of insulin ([place]); DKA (diabetic ketoacidosis) ([place]); Acute on chronic systolic heart failure, NYHA class 4 ([place]); Hyperlipidemia; Atrial fibrillation ([place]); CAD (coronary artery disease); Elevated troponin; Sepsis due to pneumonia ([place]); AKI (acute kidney injury) ([place]); Acute respiratory failure with hypoxia ([place]); Bilateral pneumonia; Pressure injury of skin; Debility; Hypoxia; Post-operative state; Anemia of chronic disease; PAF (paroxysmal atrial fibrillation) ([place]); and Acute renal failure superimposed on stage 4 chronic kidney disease ([place]) on their problem list.   Travis Bryan recently presented to the ER with shortness of breath and was found to have AFIB with RVR on 08/20/18. He received metoprolol IV 2.5 mg. Patient remained in observation for a total of 10 hours and was discharged stable. He presents today for follow-up. Reports compliance with medication, however, has been told at dialysis his blood pressure is dropping rather low. He is hypotensive today. Oral 12.5 metoprolol increased from 25 mg BID. He asymptomatic of dizziness, weakness, and reports improvement of WOB. Dialysis days Tues. ,Thurs., and Sat. He reports not missing  any recent treatments. He take warfarin for anticoagulation therapy due to atrial fibrillation.  He is followed by the Coumadin clinic at heart care and is compliant with follow-up.  Most recent INR 5.1.  He is scheduled for repeat PT/INR 08/28/2018.  Next follow-up scheduled with cardiology at the atrial fib clinic 09/02/18. Diabetes recently stable. Patient is not checking blood sugar and reports inconsistent administrator of insulin. Denies polyuria, polydipsia, tremors, or changes in appetite. Social History   Socioeconomic History  . Marital status: Widowed    Spouse name: Not on file  . Number of children: Not on file  . Years of education: Not on file  . Highest education level: Not on file  Occupational History  . Not on file  Social Needs  . Financial resource strain: Not on file  . Food insecurity    Worry: Not on file    Inability: Not on file  . Transportation needs    Medical: Not on file    Non-medical: Not on file  Tobacco Use  . Smoking status: Never Smoker  . Smokeless tobacco: Never Used  . Tobacco comment: pt denies smoking cigars  Substance and Sexual Activity  . Alcohol use: No  . Drug use: No  . Sexual activity: Not Currently  Lifestyle  . Physical activity    Days per week: Not on file    Minutes per session: Not on file  . Stress: Not on file  Relationships  . Social Herbalist on phone: Not on file    Gets together: Not on file    Attends religious service: Not on file    Active member of club or organization: Not on file    Attends meetings of clubs or organizations: Not on file  Relationship status: Not on file  . Intimate partner violence    Fear of current or ex partner: Not on file    Emotionally abused: Not on file    Physically abused: Not on file    Forced sexual activity: Not on file  Other Topics Concern  . Not on file  Social History Narrative  . Not on file    Family History  Problem Relation Age of Onset  .  Hypertension Mother   . Heart disease Mother   . Diabetes Mother    Review of Systems Pertinent negatives listed in HPI Allergies  Allergen Reactions  . No Known Allergies     Prior to Admission medications   Medication Sig Start Date End Date Taking? Authorizing Provider  acetaminophen (TYLENOL) 500 MG tablet Take 1,000 mg by mouth every 6 (six) hours as needed for mild pain or headache.   Yes [provider]  atorvastatin (LIPITOR) 40 MG tablet Take 1 tablet (40 mg total) by mouth daily at 6 PM. 05/13/18  Yes [name], FNP  clindamycin (CLEOCIN) 300 MG capsule Take 1 capsule (300 mg total) by mouth 3 (three) times daily for 7 days. 08/26/18 09/02/18 Yes Phelps, Erin O, PA-C  insulin aspart (NOVOLOG) 100 UNIT/ML injection Use 3 units three times a day with meals Patient taking differently: Inject 0-3 Units into the skin 3 (three) times daily with meals. Use 3 units three times a day with meals per sliding scale. 05/13/18 05/13/19 Yes [name], FNP  insulin glargine (LANTUS) 100 UNIT/ML injection Inject 0.22 mLs (22 Units total) into the skin at bedtime. Patient taking differently: Inject 22 Units into the skin at bedtime as needed (if BS is above 140 or higher).  05/13/18  Yes [name], FNP  Insulin Syringe-Needle U-100 31G X 15/64" 0.3 ML MISC Use to inject insulin 4 times a day 05/13/18  Yes [name], FNP  metoprolol tartrate (LOPRESSOR) 25 MG tablet Take 1 tablet (25 mg total) by mouth 2 (two) times daily. 2/63/78  Yes Delora Fuel, MD  multivitamin (RENA-VIT) TABS tablet TAKE 1 TABLET BY MOUTH AT BEDTIME. 07/29/18  Yes [name], FNP  sevelamer carbonate (RENVELA) 800 MG tablet Take 1 tablet (800 mg total) by mouth 3 (three) times daily with meals. 05/13/18  Yes [name], FNP  warfarin (COUMADIN) 5 MG tablet Take As Directed by Coumadin Clinic Patient taking differently: Take 5-7.5 mg by mouth See admin instructions. Take As Directed  by Coumadin Clinic 5 mg (5 mg x 1) every Tue, Thu, Sat; 7.5 mg (5 mg x 1.5) all other days 05/24/18  Yes Larey Dresser, MD  amiodarone (PACERONE) 200 MG tablet Take 1 tablet (200 mg total) by mouth daily. Patient not taking: Reported on 08/28/2018 05/30/18   Larey Dresser, MD    Past Medical, Surgical Family and Social History reviewed and updated.    Objective:   Today's Vitals   08/28/18 1002  BP: (!) 88/61  Pulse: (!) 106  Resp: 17  Temp: 99 F (37.2 C)  TempSrc: Temporal  SpO2: 95%  Weight: 208 lb 9.6 oz (94.6 kg)  Height: 6' (1.829 m)    BP Readings from Last 3 Encounters:  08/28/18 (!) 88/61  08/26/18 (!) 93/59  08/21/18 (!) 93/55    Filed Weights   08/28/18 1002  Weight: 208 lb 9.6 oz (94.6 kg)       Physical Exam General appearance: alert, well  developed, well nourished, cooperative and in no distress Head: Normocephalic, without obvious abnormality, atraumatic Respiratory: Respirations even and unlabored, normal respiratory rate Heart: Irregular rate and rhythm. Extremities: No gross deformities Skin: Skin color, texture, turgor normal. No rashes seen  Psych: Appropriate mood and affect. Neurologic: Mental status: Alert, oriented to person, place, and time, thought content appropriate.  Lab Results  Component Value Date   POCGLU 166 (A) 05/29/2018   POCGLU 111 (A) 05/13/2018    Lab Results  Component Value Date   HGBA1C 7.8 (H) 05/13/2018    Assessment & Plan:  1. Type 2 diabetes mellitus with chronic kidney disease on chronic dialysis, with long-term current use of insulin ([place]) -Encouraged to monitor blood sugar and administer medication as prescribed.  Last A1c was stable at 7.8 although not at goal of less than 7. Patient encouraged to administer insulin if he has not checked his blood sugar as this could result in hypoglycemia. He will continue dialysis on Tuesday, Thursday, and Saturday. - Hemoglobin A1c  2. PAF (paroxysmal atrial  fibrillation) ([place]) -Continue follow-up with atrial fibrillation clinic.  Continue management of Coumadin at the Coumadin clinic at heart care. Recent INR abnormal, 5.1,  follow-up with the Coumadin clinic tomorrow 08/28/18.  Asymptomatic of bleeding.  3. Hypotension due to drugs -Metoprolol recently increased to control heart rate given chronic atrial fibrillation. Patient is hypertensive on arrival today however was completely asymptomatic.  Advised patient to take extra precaution when changing positions at blood pressure could likely drop.  Advised patient to phone the office or go to the ER if he experiences sudden dizziness or weakness.  Patient is scheduled to follow-up with cardiology on 09/02/2018 advised to follow-up regarding low blood pressure readings and management of atrial flutter.  Reviewed entire medication list with patient and he confirms that he is taking all medications as prescribed.  Return to clinic in 3 months for chronic condition management.    Molli Barrows, FNP Primary Care at [facility] [address] [place] [phone]: [phone]

## 2018-08-28 NOTE — Patient Instructions (Addendum)
Atrial Fibrillation Clinic at [facility] [place],  [place]  [address] Get Driving Directions Main: [phone] (also call this office for your cardiac medication refills).    Shortness of Breath, Adult Shortness of breath means you have trouble breathing. Shortness of breath could be a sign of a medical problem. Follow these instructions at home:   Watch for any changes in your symptoms.  Do not use any products that contain nicotine or tobacco, such as cigarettes, e-cigarettes, and chewing tobacco.  Do not smoke. Smoking can cause shortness of breath. If you need help to quit smoking, ask your doctor.  Avoid things that can make it harder to breathe, such as: ? Mold. ? Dust. ? Air pollution. ? Chemical smells. ? Things that can cause allergy symptoms (allergens), if you have allergies.  Keep your living space clean. Use products that help remove mold and dust.  Rest as needed. Slowly return to your normal activities.  Take over-the-counter and prescription medicines only as told by your doctor. This includes oxygen therapy and inhaled medicines.  Keep all follow-up visits as told by your doctor. This is important. Contact a doctor if:  Your condition does not get better as soon as expected.  You have a hard time doing your normal activities, even after you rest.  You have new symptoms. Get help right away if:  Your shortness of breath gets worse.  You have trouble breathing when you are resting.  You feel light-headed or you pass out (faint).  You have a cough that is not helped by medicines.  You cough up blood.  You have pain with breathing.  You have pain in your chest, arms, shoulders, or belly (abdomen).  You have a fever.  You cannot walk up stairs.  You cannot exercise the way you normally do. These symptoms may represent a serious problem that is an emergency. Do not wait to see if the symptoms will go away. Get medical  help right away. Call your local emergency services (911 in the [place] Do not drive yourself to the hospital. Summary  Shortness of breath is when you have trouble breathing enough air. It can be a sign of a medical problem.  Avoid things that make it hard for you to breathe, such as smoking, pollution, mold, and dust.  Watch for any changes in your symptoms. Contact your doctor if you do not get better or you get worse. This information is not intended to replace advice given to you by your health care provider. Make sure you discuss any questions you have with your health care provider. Document Released: 08/16/2007 Document Revised: 07/30/2017 Document Reviewed: 07/30/2017 Elsevier Interactive Patient Education  2019 [employer].

## 2018-08-29 LAB — HEMOGLOBIN A1C
Est. average glucose Bld gHb Est-mCnc: 143 mg/dL
Hgb A1c MFr Bld: 6.6 % — ABNORMAL HIGH (ref 4.8–5.6)

## 2018-08-29 MED ORDER — PHYTONADIONE 5 MG PO TABS: 2.5000 mg | ORAL_TABLET | Freq: Once | ORAL | 0 refills | Status: AC

## 2018-08-29 MED FILL — PHYTONADIONE 5 MG TABS: 5 | 1 days supply | Qty: 1 | Fill #0

## 2018-08-29 NOTE — Addendum Note (Signed)
Addended by: Marcelle Overlie D on: 08/29/2018 09:24 AM   Modules accepted: Orders

## 2018-08-30 ENCOUNTER — Ambulatory Visit (INDEPENDENT_AMBULATORY_CARE_PROVIDER_SITE_OTHER): Payer: Medicaid Other | Admitting: *Deleted

## 2018-08-30 ENCOUNTER — Other Ambulatory Visit: Payer: Self-pay

## 2018-08-30 DIAGNOSIS — I483 Typical atrial flutter: Secondary | ICD-10-CM | POA: Diagnosis not present

## 2018-08-30 DIAGNOSIS — I4819 Other persistent atrial fibrillation: Secondary | ICD-10-CM

## 2018-08-30 DIAGNOSIS — Z5181 Encounter for therapeutic drug level monitoring: Secondary | ICD-10-CM

## 2018-08-30 DIAGNOSIS — Z95810 Presence of automatic (implantable) cardiac defibrillator: Secondary | ICD-10-CM | POA: Diagnosis not present

## 2018-08-30 LAB — POCT INR: INR: 5.1 — AB (ref 2.0–3.0)

## 2018-08-30 NOTE — Patient Instructions (Addendum)
Description   Hold tonight and tomorrows dose then decrease dose to 1 tablet daily except for 1.5 tablets on Mondays and Wednesdays, Recheck INR on 09/05/2018. Please call coumadin clinic with any changes in medications. [phone]

## 2018-09-01 ENCOUNTER — Encounter: Payer: Self-pay | Admitting: Family Medicine

## 2018-09-01 MED ORDER — INSULIN GLARGINE 100 UNIT/ML ~~LOC~~ SOLN: 15.0000 [IU] | Freq: Every evening | SUBCUTANEOUS | 5 refills | Status: DC | PRN

## 2018-09-02 ENCOUNTER — Ambulatory Visit (HOSPITAL_COMMUNITY)
Admission: RE | Admit: 2018-09-02 | Discharge: 2018-09-02 | Disposition: A | Discharge status: Home / Self Care | Payer: Medicare Other | Source: Ambulatory Visit | Attending: Nurse Practitioner | Admitting: Nurse Practitioner

## 2018-09-02 ENCOUNTER — Other Ambulatory Visit: Payer: Self-pay

## 2018-09-02 ENCOUNTER — Encounter (HOSPITAL_COMMUNITY): Payer: Self-pay | Admitting: Nurse Practitioner

## 2018-09-02 ENCOUNTER — Other Ambulatory Visit (HOSPITAL_COMMUNITY): Payer: Self-pay | Admitting: *Deleted

## 2018-09-02 VITALS — BP 122/64 | HR 106 | Ht 72.0 in | Wt 208.0 lb

## 2018-09-02 DIAGNOSIS — Z6828 Body mass index (BMI) 28.0-28.9, adult: Secondary | ICD-10-CM | POA: Diagnosis not present

## 2018-09-02 DIAGNOSIS — Z8673 Personal history of transient ischemic attack (TIA), and cerebral infarction without residual deficits: Secondary | ICD-10-CM | POA: Insufficient documentation

## 2018-09-02 DIAGNOSIS — Z95810 Presence of automatic (implantable) cardiac defibrillator: Secondary | ICD-10-CM | POA: Insufficient documentation

## 2018-09-02 DIAGNOSIS — I252 Old myocardial infarction: Secondary | ICD-10-CM | POA: Diagnosis not present

## 2018-09-02 DIAGNOSIS — M109 Gout, unspecified: Secondary | ICD-10-CM | POA: Insufficient documentation

## 2018-09-02 DIAGNOSIS — Z79899 Other long term (current) drug therapy: Secondary | ICD-10-CM | POA: Insufficient documentation

## 2018-09-02 DIAGNOSIS — I4819 Other persistent atrial fibrillation: Secondary | ICD-10-CM | POA: Diagnosis present

## 2018-09-02 DIAGNOSIS — R0602 Shortness of breath: Secondary | ICD-10-CM | POA: Diagnosis not present

## 2018-09-02 DIAGNOSIS — E1122 Type 2 diabetes mellitus with diabetic chronic kidney disease: Secondary | ICD-10-CM | POA: Diagnosis not present

## 2018-09-02 DIAGNOSIS — E669 Obesity, unspecified: Secondary | ICD-10-CM | POA: Insufficient documentation

## 2018-09-02 DIAGNOSIS — I4892 Unspecified atrial flutter: Secondary | ICD-10-CM | POA: Diagnosis not present

## 2018-09-02 DIAGNOSIS — Z7901 Long term (current) use of anticoagulants: Secondary | ICD-10-CM | POA: Insufficient documentation

## 2018-09-02 DIAGNOSIS — I5022 Chronic systolic (congestive) heart failure: Secondary | ICD-10-CM | POA: Insufficient documentation

## 2018-09-02 DIAGNOSIS — I130 Hypertensive heart and chronic kidney disease with heart failure and stage 1 through stage 4 chronic kidney disease, or unspecified chronic kidney disease: Secondary | ICD-10-CM | POA: Insufficient documentation

## 2018-09-02 DIAGNOSIS — Z992 Dependence on renal dialysis: Secondary | ICD-10-CM | POA: Diagnosis not present

## 2018-09-02 DIAGNOSIS — Z794 Long term (current) use of insulin: Secondary | ICD-10-CM | POA: Diagnosis not present

## 2018-09-02 DIAGNOSIS — E785 Hyperlipidemia, unspecified: Secondary | ICD-10-CM | POA: Diagnosis not present

## 2018-09-02 DIAGNOSIS — N189 Chronic kidney disease, unspecified: Secondary | ICD-10-CM | POA: Insufficient documentation

## 2018-09-02 NOTE — Progress Notes (Signed)
Needs followup office appointment.

## 2018-09-02 NOTE — Patient Instructions (Addendum)
Have INR checked on 6/25 as scheduled  Cardioversion scheduled for Wednesday, July 1st  - GO for coumadin check (they will schedule this prior to arrival to hospital)   - Arrive at the [facility] and go to admitting at [facility] not eat or drink anything after midnight the night prior to your procedure.  - Take all your morning medication with a sip of water prior to arrival.   - You will not be able to drive home after your procedure.

## 2018-09-02 NOTE — Progress Notes (Signed)
Patient notified of results & recommendations. Expressed understanding.

## 2018-09-02 NOTE — Progress Notes (Signed)
Needs follow up office visit.

## 2018-09-02 NOTE — Progress Notes (Signed)
Primary Care Physician: [name], FNP Primary Cardiologist: Dr Aundra Dubin Primary Electrophysiologist: Dr Lovena Le Referring Physician: Dr Heide Spark is a 48 y.o. male with a history of persistent atrial fibrillation, atrial flutter, CKD Stage IV, HTN, NICM, chronic systolic heart failure, ST Jude ICD, and VT who presents for follow up in the [facility]. Patient recently seen in ER for SOB on exertion and fatigue and was found to be in afib with RVR. He was started on metoprolol and discharged. He was also seen back in ER recently again for an abscess. He continues to have symptoms stating that he has trouble walking to his car without becoming fatigued. He is in afib today.  Today, he denies symptoms of palpitations, chest pain, orthopnea, PND, lower extremity edema, dizziness, presyncope, syncope, snoring, daytime somnolence, bleeding, or neurologic sequela. The patient is tolerating medications without difficulties and is otherwise without complaint today.    Atrial Fibrillation Risk Factors:  he does not have symptoms or diagnosis of sleep apnea. he does not have a history of rheumatic fever. he does not have a history of alcohol use. The patient does not have a history of early familial atrial fibrillation or other arrhythmias.  he has a BMI of Body mass index is 28.21 kg/m.Marland Kitchen Filed Weights   09/02/18 1049  Weight: 94.3 kg    Family History  Problem Relation Age of Onset  . Hypertension Mother   . Heart disease Mother   . Diabetes Mother      Atrial Fibrillation Management history:  Previous antiarrhythmic drugs: amiodarone Previous cardioversions: 2018 Previous ablations: aflutter 03/2016 CHADS2VASC score: 6 Anticoagulation history: warfarin   Past Medical History:  Diagnosis Date  . AICD (automatic cardioverter/defibrillator) present   . Atrial flutter ([place])    a. s/p ablation 03/2016  . Chronic kidney disease   .  Chronic systolic CHF (congestive heart failure) ([place])   . HCAP (healthcare-associated pneumonia) 03/28/2018  . History of gout   . Hyperlipidemia   . Hypertension   . Myocardial infarction [facility])    "I think I had one a long long time ago" (03/28/2016)  . NICM (nonischemic cardiomyopathy) ([place])    a. LHC 6/06: pLAD 20, pLCx 20-30; b. Echo 5/15:  EF 15%, diffuse HK, restrictive physiology, trivial AI, trivial MR, mild LAE, moderate RVE, moderately reduced RVSF, moderate RAE, mild to moderate TR, PASP 43 mmHg  . Obesity   . Persistent atrial fibrillation   . Stroke ([place])   . Type II diabetes mellitus ([place])    Past Surgical History:  Procedure Laterality Date  . AV FISTULA PLACEMENT Left 04/25/2018   Procedure: ARTERIOVENOUS (AV) VS GRAFT ARM;  Surgeon: Serafina Mitchell, MD;  Location: [facility];  Service: Vascular;  Laterality: Left;  . BASCILIC VEIN TRANSPOSITION Left 07/19/2018   Procedure: SECOND STAGE BASILIC VEIN TRANSPOSITION LEFT ARM;  Surgeon: Serafina Mitchell, MD;  Location: [facility];  Service: Vascular;  Laterality: Left;  . CARDIOVERSION N/A 04/07/2016   Procedure: CARDIOVERSION;  Surgeon: Thayer Headings, MD;  Location: [facility] ENDOSCOPY;  Service: Cardiovascular;  Laterality: N/A;  . CARDIOVERSION N/A 04/11/2016   Procedure: CARDIOVERSION;  Surgeon: Larey Dresser, MD;  Location: [facility];  Service: Cardiovascular;  Laterality: N/A;  . ELECTROPHYSIOLOGIC STUDY N/A 03/31/2016   Procedure: A-Flutter Ablation;  Surgeon: Evans Lance, MD;  Location: [facility] CV LAB;  Service: Cardiovascular;  Laterality: N/A;  . EXCHANGE OF A DIALYSIS CATHETER  Left 04/25/2018   Procedure: Attempted Exchange Of A Dialysis Catheter on the Left side;  Surgeon: Serafina Mitchell, MD;  Location: [facility] OR;  Service: Vascular;  Laterality: Left;  . IMPLANTABLE CARDIOVERTER DEFIBRILLATOR (ICD) GENERATOR CHANGE N/A 08/27/2013   Procedure: ICD GENERATOR CHANGE;  Surgeon: Evans Lance, MD;  Location: [facility] CATH LAB;   Service: Cardiovascular;  Laterality: N/A;  . INSERTION OF DIALYSIS CATHETER Right 04/25/2018   Procedure: INSERTION OF TUNNELED DIALYSIS CATHETER;  Surgeon: Serafina Mitchell, MD;  Location: [facility] OR;  Service: Vascular;  Laterality: Right;  . TEE WITHOUT CARDIOVERSION N/A 03/31/2016   Procedure: TRANSESOPHAGEAL ECHOCARDIOGRAM (TEE);  Surgeon: Larey Dresser, MD;  Location: [facility] ENDOSCOPY;  Service: Cardiovascular;  Laterality: N/A;    Current Outpatient Medications  Medication Sig Dispense Refill  . amiodarone (PACERONE) 200 MG tablet Take 1 tablet (200 mg total) by mouth daily. 30 tablet 0  . atorvastatin (LIPITOR) 40 MG tablet Take 1 tablet (40 mg total) by mouth daily at 6 PM. 90 tablet 2  . clindamycin (CLEOCIN) 300 MG capsule Take 1 capsule (300 mg total) by mouth 3 (three) times daily for 7 days. 21 capsule 0  . insulin aspart (NOVOLOG) 100 UNIT/ML injection Use 3 units three times a day with meals (Patient taking differently: Inject 0-3 Units into the skin 3 (three) times daily with meals. Use 3 units three times a day with meals per sliding scale.) 10 mL 3  . insulin glargine (LANTUS) 100 UNIT/ML injection Inject 0.15 mLs (15 Units total) into the skin at bedtime as needed. 10 mL 5  . metoprolol tartrate (LOPRESSOR) 25 MG tablet Take 1 tablet (25 mg total) by mouth 2 (two) times daily. 60 tablet 0  . multivitamin (RENA-VIT) TABS tablet TAKE 1 TABLET BY MOUTH AT BEDTIME. 30 tablet 6  . sevelamer carbonate (RENVELA) 800 MG tablet Take 1 tablet (800 mg total) by mouth 3 (three) times daily with meals. 90 tablet 3  . warfarin (COUMADIN) 5 MG tablet Take As Directed by Coumadin Clinic (Patient taking differently: Take 5-7.5 mg by mouth See admin instructions. Take As Directed by Coumadin Clinic 5 mg (5 mg x 1) every Tue, Thu, Sat; 7.5 mg (5 mg x 1.5) all other days) 45 tablet 3  . acetaminophen (TYLENOL) 500 MG tablet Take 1,000 mg by mouth every 6 (six) hours as needed for mild pain or headache.     . Insulin Syringe-Needle U-100 31G X 15/64" 0.3 ML MISC Use to inject insulin 4 times a day 200 each 12   No current facility-administered medications for this encounter.     Allergies  Allergen Reactions  . No Known Allergies     Social History   Socioeconomic History  . Marital status: Widowed    Spouse name: Not on file  . Number of children: Not on file  . Years of education: Not on file  . Highest education level: Not on file  Occupational History  . Not on file  Social Needs  . Financial resource strain: Not on file  . Food insecurity    Worry: Not on file    Inability: Not on file  . Transportation needs    Medical: Not on file    Non-medical: Not on file  Tobacco Use  . Smoking status: Never Smoker  . Smokeless tobacco: Never Used  . Tobacco comment: pt denies smoking cigars  Substance and Sexual Activity  . Alcohol use: No  . Drug use: No  .  Sexual activity: Not Currently  Lifestyle  . Physical activity    Days per week: Not on file    Minutes per session: Not on file  . Stress: Not on file  Relationships  . Social Herbalist on phone: Not on file    Gets together: Not on file    Attends religious service: Not on file    Active member of club or organization: Not on file    Attends meetings of clubs or organizations: Not on file    Relationship status: Not on file  . Intimate partner violence    Fear of current or ex partner: Not on file    Emotionally abused: Not on file    Physically abused: Not on file    Forced sexual activity: Not on file  Other Topics Concern  . Not on file  Social History Narrative  . Not on file     ROS- All systems are reviewed and negative except as per the HPI above.  Physical Exam: Vitals:   09/02/18 1049  BP: 122/64  Pulse: (!) 106  Weight: 94.3 kg  Height: 6' (1.829 m)    GEN- The patient is well appearing, alert and oriented x 3 today.   Head- normocephalic, atraumatic Eyes-  Sclera clear,  conjunctiva pink Ears- hearing intact Oropharynx- clear Neck- supple  Lungs- Clear to ausculation bilaterally, normal work of breathing Heart- irregular rate and rhythm, no murmurs, rubs or gallops  GI- soft, NT, ND, + BS Extremities- no clubbing, cyanosis, or edema MS- no significant deformity or atrophy Skin- no rash or lesion Psych- euthymic mood, full affect Neuro- strength and sensation are intact  Wt Readings from Last 3 Encounters:  09/02/18 94.3 kg  08/28/18 94.6 kg  08/21/18 97.5 kg    EKG today demonstrates afib HR 106, RAD, nonspecific intraventricular block. QRS 186, QTc 576.  Echo 02/27/18 demonstrated  - Left ventricle: Diffuse hypokinesis worse in the inferior wall.   The cavity size was moderately dilated. Wall thickness was   increased in a pattern of mild LVH. Systolic function was   severely reduced. The estimated ejection fraction was in the   range of 25% to 30%. Doppler parameters are consistent with both   elevated ventricular end-diastolic filling pressure and elevated   left atrial filling pressure. - Left atrium: The atrium was mildly dilated. - Atrial septum: No defect or patent foramen ovale was identified.  Epic records are reviewed at length today  Assessment and Plan:  1. Persistent atrial fibrillation Patient is in afib today with symptoms of fatigue and SOB.  No room to increase BB as he becomes hypotensive on days then he has HD. Will arrange for DCCV. Weekly INRs for 4 weeks.  Continue Lopressor 25 mg BID Continue amiodarone 200 mg daily. Would not increase given QT and wide QRS. Continue warfarin  This patients CHA2DS2-VASc Score and unadjusted Ischemic Stroke Rate (% per year) is equal to 9.7 % stroke rate/year from a score of 6  Above score calculated as 1 point each if present [CHF, HTN, DM, Vascular=MI/PAD/Aortic Plaque, Age if 65-74, or Male] Above score calculated as 2 points each if present [Age > 75, or Stroke/TIA/TE]    2. Overweight Body mass index is 28.21 kg/m. Lifestyle modification was discussed at length including regular exercise and weight reduction.  3. VF/VT S/p ICD. Followed by Dr Lovena Le and device clinic.  4. Chronic systolic CHF No signs or symptoms of fluid overload.  Will have him reestablish with HF clinic.    Follow up one week post DCCV.   [facility] [address] [place], [place] [address] [phone] 09/02/2018 11:52 AM

## 2018-09-03 MED FILL — AMIODARONE HCL 200 MG TAB: 200 | 30 days supply | Qty: 30 | Fill #2

## 2018-09-05 ENCOUNTER — Other Ambulatory Visit: Payer: Self-pay

## 2018-09-05 ENCOUNTER — Encounter (INDEPENDENT_AMBULATORY_CARE_PROVIDER_SITE_OTHER): Payer: Self-pay

## 2018-09-05 ENCOUNTER — Ambulatory Visit (INDEPENDENT_AMBULATORY_CARE_PROVIDER_SITE_OTHER): Payer: Medicaid Other | Admitting: *Deleted

## 2018-09-05 DIAGNOSIS — I4819 Other persistent atrial fibrillation: Secondary | ICD-10-CM

## 2018-09-05 DIAGNOSIS — Z95810 Presence of automatic (implantable) cardiac defibrillator: Secondary | ICD-10-CM

## 2018-09-05 DIAGNOSIS — Z5181 Encounter for therapeutic drug level monitoring: Secondary | ICD-10-CM | POA: Diagnosis not present

## 2018-09-05 DIAGNOSIS — I483 Typical atrial flutter: Secondary | ICD-10-CM | POA: Diagnosis not present

## 2018-09-05 LAB — POCT INR: INR: 4 — AB (ref 2.0–3.0)

## 2018-09-05 NOTE — Patient Instructions (Addendum)
Description   Hold tonight's dose then start taking 1 tablet daily except for 1.5 tablets on Mondays and Fridays. Recheck INR on 09/11/2018 before Cardioversion. Please call coumadin clinic with any changes in medications. [phone]

## 2018-09-07 ENCOUNTER — Other Ambulatory Visit: Payer: Self-pay

## 2018-09-07 ENCOUNTER — Encounter (HOSPITAL_COMMUNITY): Payer: Self-pay | Admitting: Emergency Medicine

## 2018-09-07 ENCOUNTER — Emergency Department (HOSPITAL_COMMUNITY): Payer: Medicare Other

## 2018-09-07 ENCOUNTER — Inpatient Hospital Stay (HOSPITAL_COMMUNITY)
Admission: EM | Admit: 2018-09-07 | Discharge: 2018-09-12 | DRG: 871 | Disposition: A | Discharge status: Home / Self Care | Payer: Medicare Other | Attending: Internal Medicine | Admitting: Internal Medicine

## 2018-09-07 DIAGNOSIS — R6521 Severe sepsis with septic shock: Secondary | ICD-10-CM | POA: Diagnosis present

## 2018-09-07 DIAGNOSIS — K7460 Unspecified cirrhosis of liver: Secondary | ICD-10-CM | POA: Diagnosis present

## 2018-09-07 DIAGNOSIS — K625 Hemorrhage of anus and rectum: Secondary | ICD-10-CM | POA: Diagnosis present

## 2018-09-07 DIAGNOSIS — E1169 Type 2 diabetes mellitus with other specified complication: Secondary | ICD-10-CM

## 2018-09-07 DIAGNOSIS — Z992 Dependence on renal dialysis: Secondary | ICD-10-CM

## 2018-09-07 DIAGNOSIS — D631 Anemia in chronic kidney disease: Secondary | ICD-10-CM | POA: Diagnosis present

## 2018-09-07 DIAGNOSIS — K635 Polyp of colon: Secondary | ICD-10-CM | POA: Diagnosis present

## 2018-09-07 DIAGNOSIS — Z20828 Contact with and (suspected) exposure to other viral communicable diseases: Secondary | ICD-10-CM | POA: Diagnosis present

## 2018-09-07 DIAGNOSIS — I959 Hypotension, unspecified: Secondary | ICD-10-CM | POA: Diagnosis not present

## 2018-09-07 DIAGNOSIS — K921 Melena: Secondary | ICD-10-CM | POA: Diagnosis present

## 2018-09-07 DIAGNOSIS — A419 Sepsis, unspecified organism: Secondary | ICD-10-CM | POA: Diagnosis present

## 2018-09-07 DIAGNOSIS — I5022 Chronic systolic (congestive) heart failure: Secondary | ICD-10-CM | POA: Diagnosis present

## 2018-09-07 DIAGNOSIS — Z7901 Long term (current) use of anticoagulants: Secondary | ICD-10-CM

## 2018-09-07 DIAGNOSIS — Z794 Long term (current) use of insulin: Secondary | ICD-10-CM | POA: Diagnosis not present

## 2018-09-07 DIAGNOSIS — I493 Ventricular premature depolarization: Secondary | ICD-10-CM | POA: Diagnosis present

## 2018-09-07 DIAGNOSIS — L299 Pruritus, unspecified: Secondary | ICD-10-CM | POA: Diagnosis present

## 2018-09-07 DIAGNOSIS — D62 Acute posthemorrhagic anemia: Secondary | ICD-10-CM | POA: Diagnosis present

## 2018-09-07 DIAGNOSIS — I132 Hypertensive heart and chronic kidney disease with heart failure and with stage 5 chronic kidney disease, or end stage renal disease: Secondary | ICD-10-CM | POA: Diagnosis present

## 2018-09-07 DIAGNOSIS — K224 Dyskinesia of esophagus: Secondary | ICD-10-CM | POA: Diagnosis present

## 2018-09-07 DIAGNOSIS — Z8249 Family history of ischemic heart disease and other diseases of the circulatory system: Secondary | ICD-10-CM

## 2018-09-07 DIAGNOSIS — K648 Other hemorrhoids: Secondary | ICD-10-CM | POA: Diagnosis present

## 2018-09-07 DIAGNOSIS — R791 Abnormal coagulation profile: Secondary | ICD-10-CM | POA: Diagnosis present

## 2018-09-07 DIAGNOSIS — Z95810 Presence of automatic (implantable) cardiac defibrillator: Secondary | ICD-10-CM | POA: Diagnosis not present

## 2018-09-07 DIAGNOSIS — E669 Obesity, unspecified: Secondary | ICD-10-CM | POA: Diagnosis present

## 2018-09-07 DIAGNOSIS — N186 End stage renal disease: Secondary | ICD-10-CM | POA: Diagnosis present

## 2018-09-07 DIAGNOSIS — I428 Other cardiomyopathies: Secondary | ICD-10-CM | POA: Diagnosis present

## 2018-09-07 DIAGNOSIS — N2581 Secondary hyperparathyroidism of renal origin: Secondary | ICD-10-CM | POA: Diagnosis present

## 2018-09-07 DIAGNOSIS — Z833 Family history of diabetes mellitus: Secondary | ICD-10-CM

## 2018-09-07 DIAGNOSIS — I252 Old myocardial infarction: Secondary | ICD-10-CM

## 2018-09-07 DIAGNOSIS — R6520 Severe sepsis without septic shock: Secondary | ICD-10-CM | POA: Diagnosis present

## 2018-09-07 DIAGNOSIS — E785 Hyperlipidemia, unspecified: Secondary | ICD-10-CM | POA: Diagnosis present

## 2018-09-07 DIAGNOSIS — I953 Hypotension of hemodialysis: Secondary | ICD-10-CM | POA: Diagnosis present

## 2018-09-07 DIAGNOSIS — E1122 Type 2 diabetes mellitus with diabetic chronic kidney disease: Secondary | ICD-10-CM | POA: Diagnosis present

## 2018-09-07 DIAGNOSIS — K5900 Constipation, unspecified: Secondary | ICD-10-CM | POA: Diagnosis present

## 2018-09-07 DIAGNOSIS — I4892 Unspecified atrial flutter: Secondary | ICD-10-CM | POA: Diagnosis present

## 2018-09-07 DIAGNOSIS — E119 Type 2 diabetes mellitus without complications: Secondary | ICD-10-CM

## 2018-09-07 DIAGNOSIS — M109 Gout, unspecified: Secondary | ICD-10-CM | POA: Diagnosis present

## 2018-09-07 DIAGNOSIS — Z6828 Body mass index (BMI) 28.0-28.9, adult: Secondary | ICD-10-CM

## 2018-09-07 DIAGNOSIS — I4819 Other persistent atrial fibrillation: Secondary | ICD-10-CM | POA: Diagnosis present

## 2018-09-07 DIAGNOSIS — E8889 Other specified metabolic disorders: Secondary | ICD-10-CM | POA: Diagnosis present

## 2018-09-07 DIAGNOSIS — E11649 Type 2 diabetes mellitus with hypoglycemia without coma: Secondary | ICD-10-CM | POA: Diagnosis present

## 2018-09-07 DIAGNOSIS — G8929 Other chronic pain: Secondary | ICD-10-CM | POA: Diagnosis present

## 2018-09-07 DIAGNOSIS — R0902 Hypoxemia: Secondary | ICD-10-CM | POA: Diagnosis not present

## 2018-09-07 DIAGNOSIS — Z79899 Other long term (current) drug therapy: Secondary | ICD-10-CM

## 2018-09-07 DIAGNOSIS — Z8673 Personal history of transient ischemic attack (TIA), and cerebral infarction without residual deficits: Secondary | ICD-10-CM

## 2018-09-07 LAB — GLUCOSE, CAPILLARY
Glucose-Capillary: 102 mg/dL — ABNORMAL HIGH (ref 70–99)
Glucose-Capillary: 138 mg/dL — ABNORMAL HIGH (ref 70–99)

## 2018-09-07 LAB — TYPE AND SCREEN
ABO/RH(D): O POS
Antibody Screen: NEGATIVE

## 2018-09-07 LAB — CBC WITH DIFFERENTIAL/PLATELET
Abs Immature Granulocytes: 0.09 10*3/uL — ABNORMAL HIGH (ref 0.00–0.07)
Basophils Absolute: 0 10*3/uL (ref 0.0–0.1)
Basophils Relative: 0 %
Eosinophils Absolute: 0.2 10*3/uL (ref 0.0–0.5)
Eosinophils Relative: 1 %
HCT: 30.7 % — ABNORMAL LOW (ref 39.0–52.0)
Hemoglobin: 9.2 g/dL — ABNORMAL LOW (ref 13.0–17.0)
Immature Granulocytes: 1 %
Lymphocytes Relative: 3 %
Lymphs Abs: 0.5 10*3/uL — ABNORMAL LOW (ref 0.7–4.0)
MCH: 29.4 pg (ref 26.0–34.0)
MCHC: 30 g/dL (ref 30.0–36.0)
MCV: 98.1 fL (ref 80.0–100.0)
Monocytes Absolute: 0.2 10*3/uL (ref 0.1–1.0)
Monocytes Relative: 1 %
Neutro Abs: 14.9 10*3/uL — ABNORMAL HIGH (ref 1.7–7.7)
Neutrophils Relative %: 94 %
Platelets: 288 10*3/uL (ref 150–400)
RBC: 3.13 MIL/uL — ABNORMAL LOW (ref 4.22–5.81)
RDW: 15.9 % — ABNORMAL HIGH (ref 11.5–15.5)
WBC: 15.9 10*3/uL — ABNORMAL HIGH (ref 4.0–10.5)
nRBC: 0.3 % — ABNORMAL HIGH (ref 0.0–0.2)

## 2018-09-07 LAB — COMPREHENSIVE METABOLIC PANEL
ALT: 38 U/L (ref 0–44)
AST: 37 U/L (ref 15–41)
Albumin: 3 g/dL — ABNORMAL LOW (ref 3.5–5.0)
Alkaline Phosphatase: 193 U/L — ABNORMAL HIGH (ref 38–126)
Anion gap: 18 — ABNORMAL HIGH (ref 5–15)
BUN: 34 mg/dL — ABNORMAL HIGH (ref 6–20)
CO2: 20 mmol/L — ABNORMAL LOW (ref 22–32)
Calcium: 9.4 mg/dL (ref 8.9–10.3)
Chloride: 100 mmol/L (ref 98–111)
Creatinine, Ser: 10.65 mg/dL — ABNORMAL HIGH (ref 0.61–1.24)
GFR calc Af Amer: 6 mL/min — ABNORMAL LOW (ref >60)
GFR calc non Af Amer: 5 mL/min — ABNORMAL LOW (ref >60)
Glucose, Bld: 157 mg/dL — ABNORMAL HIGH (ref 70–99)
Potassium: 4.1 mmol/L (ref 3.5–5.1)
Sodium: 138 mmol/L (ref 135–145)
Total Bilirubin: 2.7 mg/dL — ABNORMAL HIGH (ref 0.3–1.2)
Total Protein: 7.5 g/dL (ref 6.5–8.1)

## 2018-09-07 LAB — SARS CORONAVIRUS 2 BY RT PCR (HOSPITAL ORDER, PERFORMED IN ~~LOC~~ HOSPITAL LAB): SARS Coronavirus 2: NEGATIVE

## 2018-09-07 LAB — SURGICAL PCR SCREEN
MRSA, PCR: NEGATIVE
Staphylococcus aureus: NEGATIVE

## 2018-09-07 LAB — PROCALCITONIN: Procalcitonin: 74.66 ng/mL

## 2018-09-07 LAB — LACTIC ACID, PLASMA
Lactic Acid, Venous: 4.4 mmol/L (ref 0.5–1.9)
Lactic Acid, Venous: 1 mmol/L (ref 0.5–1.9)
Lactic Acid, Venous: 1.4 mmol/L (ref 0.5–1.9)

## 2018-09-07 LAB — HEMOGLOBIN AND HEMATOCRIT, BLOOD
HCT: 27.5 % — ABNORMAL LOW (ref 39.0–52.0)
Hemoglobin: 8.5 g/dL — ABNORMAL LOW (ref 13.0–17.0)

## 2018-09-07 MED ORDER — METRONIDAZOLE IN NACL 5-0.79 MG/ML-% IV SOLN
500.0000 mg | Freq: Once | INTRAVENOUS | Status: AC
  Administered 2018-09-07: 500 mg via INTRAVENOUS
  Filled 2018-09-07: qty 100

## 2018-09-07 MED ORDER — SODIUM CHLORIDE 0.9 % IV BOLUS
1000.0000 mL | Freq: Once | INTRAVENOUS | Status: AC
  Administered 2018-09-07: 1000 mL via INTRAVENOUS

## 2018-09-07 MED ORDER — METRONIDAZOLE IN NACL 5-0.79 MG/ML-% IV SOLN
500.0000 mg | Freq: Three times a day (TID) | INTRAVENOUS | Status: DC
  Administered 2018-09-07 – 2018-09-12 (×14): 500 mg via INTRAVENOUS
  Filled 2018-09-07 (×14): qty 100

## 2018-09-07 MED ORDER — DARBEPOETIN ALFA 100 MCG/0.5ML IJ SOSY
100.0000 ug | PREFILLED_SYRINGE | INTRAMUSCULAR | Status: DC
  Administered 2018-09-11: 100 ug via INTRAVENOUS
  Filled 2018-09-07 (×2): qty 0.5

## 2018-09-07 MED ORDER — CHLORHEXIDINE GLUCONATE CLOTH 2 % EX PADS
6.0000 | MEDICATED_PAD | Freq: Every day | CUTANEOUS | Status: DC
  Administered 2018-09-08 – 2018-09-12 (×4): 6 via TOPICAL

## 2018-09-07 MED ORDER — SODIUM CHLORIDE 0.9% IV SOLUTION
Freq: Once | INTRAVENOUS | Status: AC
  Administered 2018-09-07: 14:00:00 via INTRAVENOUS

## 2018-09-07 MED ORDER — SODIUM CHLORIDE 0.9 % IV SOLN
2.0000 g | INTRAVENOUS | Status: DC
  Administered 2018-09-08 – 2018-09-12 (×4): 2 g via INTRAVENOUS
  Filled 2018-09-07 (×4): qty 20

## 2018-09-07 MED ORDER — ACETAMINOPHEN 325 MG PO TABS: 650.0000 mg | ORAL_TABLET | Freq: Four times a day (QID) | ORAL | Status: DC | PRN

## 2018-09-07 MED ORDER — VITAMIN K1 10 MG/ML IJ SOLN
5.0000 mg | Freq: Once | INTRAVENOUS | Status: AC
  Administered 2018-09-07: 5 mg via INTRAVENOUS
  Filled 2018-09-07: qty 0.5

## 2018-09-07 MED ORDER — RENA-VITE PO TABS
1.0000 | ORAL_TABLET | Freq: Every day | ORAL | Status: DC
  Administered 2018-09-07 – 2018-09-11 (×5): 1 via ORAL
  Filled 2018-09-07 (×5): qty 1

## 2018-09-07 MED ORDER — SEVELAMER CARBONATE 800 MG PO TABS
2400.0000 mg | ORAL_TABLET | Freq: Three times a day (TID) | ORAL | Status: DC
  Administered 2018-09-07 – 2018-09-12 (×8): 2400 mg via ORAL
  Filled 2018-09-07 (×10): qty 3

## 2018-09-07 MED ORDER — AMIODARONE HCL 200 MG PO TABS
200.0000 mg | ORAL_TABLET | Freq: Every day | ORAL | Status: DC
  Administered 2018-09-07 – 2018-09-12 (×5): 200 mg via ORAL
  Filled 2018-09-07 (×5): qty 1

## 2018-09-07 MED ORDER — ACETAMINOPHEN 650 MG RE SUPP: 650.0000 mg | Freq: Four times a day (QID) | RECTAL | Status: DC | PRN

## 2018-09-07 MED ORDER — ATORVASTATIN CALCIUM 40 MG PO TABS
40.0000 mg | ORAL_TABLET | Freq: Every day | ORAL | Status: DC
  Administered 2018-09-07 – 2018-09-11 (×5): 40 mg via ORAL
  Filled 2018-09-07 (×5): qty 1

## 2018-09-07 MED ORDER — SODIUM CHLORIDE 0.9% FLUSH
3.0000 mL | Freq: Two times a day (BID) | INTRAVENOUS | Status: DC
  Administered 2018-09-07 – 2018-09-12 (×9): 3 mL via INTRAVENOUS

## 2018-09-07 MED ORDER — SODIUM CHLORIDE 0.9 % IV SOLN
2.0000 g | Freq: Once | INTRAVENOUS | Status: AC
  Administered 2018-09-07: 2 g via INTRAVENOUS
  Filled 2018-09-07: qty 20

## 2018-09-07 MED ORDER — SODIUM CHLORIDE 0.9 % IV BOLUS: 1000.0000 mL | Freq: Once | INTRAVENOUS | Status: DC

## 2018-09-07 MED ORDER — ALBUTEROL SULFATE (2.5 MG/3ML) 0.083% IN NEBU: 2.5000 mg | INHALATION_SOLUTION | RESPIRATORY_TRACT | Status: DC | PRN

## 2018-09-07 MED ORDER — INSULIN ASPART 100 UNIT/ML ~~LOC~~ SOLN
0.0000 [IU] | Freq: Three times a day (TID) | SUBCUTANEOUS | Status: DC
  Administered 2018-09-08: 1 [IU] via SUBCUTANEOUS

## 2018-09-07 MED ORDER — DOXERCALCIFEROL 4 MCG/2ML IV SOLN
1.0000 ug | INTRAVENOUS | Status: DC
  Administered 2018-09-11 – 2018-09-12 (×2): 1 ug via INTRAVENOUS
  Filled 2018-09-07 (×2): qty 2

## 2018-09-07 MED ORDER — VANCOMYCIN HCL IN DEXTROSE 1-5 GM/200ML-% IV SOLN
1000.0000 mg | Freq: Once | INTRAVENOUS | Status: AC
  Administered 2018-09-07: 1000 mg via INTRAVENOUS
  Filled 2018-09-07: qty 200

## 2018-09-07 MED ORDER — ONDANSETRON HCL 4 MG/2ML IJ SOLN
4.0000 mg | Freq: Four times a day (QID) | INTRAMUSCULAR | Status: DC | PRN
  Filled 2018-09-07: qty 2

## 2018-09-07 MED ORDER — ONDANSETRON HCL 4 MG PO TABS: 4.0000 mg | ORAL_TABLET | Freq: Four times a day (QID) | ORAL | Status: DC | PRN

## 2018-09-07 MED ORDER — SEVELAMER CARBONATE 800 MG PO TABS: 800.0000 mg | ORAL_TABLET | Freq: Three times a day (TID) | ORAL | Status: DC

## 2018-09-07 MED ORDER — DOXERCALCIFEROL 4 MCG/2ML IV SOLN
1.0000 ug | Freq: Once | INTRAVENOUS | Status: AC
  Administered 2018-09-08: 1 ug via INTRAVENOUS
  Filled 2018-09-07 (×3): qty 2

## 2018-09-07 MED ORDER — DARBEPOETIN ALFA 60 MCG/0.3ML IJ SOSY
60.0000 ug | PREFILLED_SYRINGE | Freq: Once | INTRAMUSCULAR | Status: DC
  Filled 2018-09-07: qty 0.3

## 2018-09-07 NOTE — ED Notes (Signed)
Spoke to pt about need for urine sample; urinal at bedside. Pt reports that he makes little urine (dialysis).

## 2018-09-07 NOTE — ED Triage Notes (Signed)
Pt coming from home today. Pt states he woke up for dialysis this am. He states he took a 15 min nap and when he woke up he started to not feel good. Pt states he started having a cough and generally not feeling well.

## 2018-09-07 NOTE — ED Notes (Signed)
Pt back from CT, BP 100/66. Axox4.

## 2018-09-07 NOTE — ED Notes (Addendum)
ED TO INPATIENT HANDOFF REPORT  ED Nurse Name and Phone #: Travis Bryan [place] Name/Age/Gender Travis Bryan 48 y.o. male Room/Bed: 034C/034C  Code Status   Code Status: Prior  Home/SNF/Other Home Patient oriented to: self, place, time and situation Is this baseline? Yes   Triage Complete: Triage complete  Chief Complaint Shaking,Cough  Triage Note Pt coming from home today. Pt states he woke up for dialysis this am. He states he took a 15 min nap and when he woke up he started to not feel good. Pt states he started having a cough and generally not feeling well.   Allergies Allergies  Allergen Reactions  . No Known Allergies     Level of Care/Admitting Diagnosis ED Disposition    ED Disposition Condition [facility] Area: [place] [[id number]]  Level of Care: Progressive [102]  Covid Evaluation: Screening Protocol (No Symptoms)  Diagnosis: Severe sepsis [facility]) [[id number]]  Admitting Physician: Norval [name] [[id number]]  Attending Physician: Norval [name] [[id number]]  Estimated length of stay: past midnight tomorrow  Certification:: I certify this patient will need inpatient services for at least 2 midnights  PT Class (Do Not Modify): Inpatient [101]  PT Acc Code (Do Not Modify): Private [1]       B Medical/Surgery History Past Medical History:  Diagnosis Date  . AICD (automatic cardioverter/defibrillator) present   . Atrial flutter ([place])    a. s/p ablation 03/2016  . Chronic kidney disease   . Chronic systolic CHF (congestive heart failure) ([place])   . HCAP (healthcare-associated pneumonia) 03/28/2018  . History of gout   . Hyperlipidemia   . Hypertension   . Myocardial infarction [facility])    "I think I had one a long long time ago" (03/28/2016)  . NICM (nonischemic cardiomyopathy) ([place])    a. LHC 6/06: pLAD 20, pLCx 20-30; b. Echo 5/15:  EF 15%, diffuse HK, restrictive physiology, trivial AI, trivial MR, mild LAE, moderate RVE,  moderately reduced RVSF, moderate RAE, mild to moderate TR, PASP 43 mmHg  . Obesity   . Persistent atrial fibrillation   . Stroke ([place])   . Type II diabetes mellitus ([place])    Past Surgical History:  Procedure Laterality Date  . AV FISTULA PLACEMENT Left 04/25/2018   Procedure: ARTERIOVENOUS (AV) VS GRAFT ARM;  Surgeon: Serafina Mitchell, MD;  Location: [place];  Service: Vascular;  Laterality: Left;  . BASCILIC VEIN TRANSPOSITION Left 07/19/2018   Procedure: SECOND STAGE BASILIC VEIN TRANSPOSITION LEFT ARM;  Surgeon: Serafina Mitchell, MD;  Location: [place];  Service: Vascular;  Laterality: Left;  . CARDIOVERSION N/A 04/07/2016   Procedure: CARDIOVERSION;  Surgeon: Thayer Headings, MD;  Location: [facility] ENDOSCOPY;  Service: Cardiovascular;  Laterality: N/A;  . CARDIOVERSION N/A 04/11/2016   Procedure: CARDIOVERSION;  Surgeon: Larey Dresser, MD;  Location: [place];  Service: Cardiovascular;  Laterality: N/A;  . ELECTROPHYSIOLOGIC STUDY N/A 03/31/2016   Procedure: A-Flutter Ablation;  Surgeon: Evans Lance, MD;  Location: [place] CV LAB;  Service: Cardiovascular;  Laterality: N/A;  . EXCHANGE OF A DIALYSIS CATHETER Left 04/25/2018   Procedure: Attempted Exchange Of A Dialysis Catheter on the Left side;  Surgeon: Serafina Mitchell, MD;  Location: [facility] OR;  Service: Vascular;  Laterality: Left;  . IMPLANTABLE CARDIOVERTER DEFIBRILLATOR (ICD) GENERATOR CHANGE N/A 08/27/2013   Procedure: ICD GENERATOR CHANGE;  Surgeon: Evans Lance, MD;  Location: [facility] CATH LAB;  Service: Cardiovascular;  Laterality: N/A;  .  INSERTION OF DIALYSIS CATHETER Right 04/25/2018   Procedure: INSERTION OF TUNNELED DIALYSIS CATHETER;  Surgeon: Serafina Mitchell, MD;  Location: [facility] OR;  Service: Vascular;  Laterality: Right;  . TEE WITHOUT CARDIOVERSION N/A 03/31/2016   Procedure: TRANSESOPHAGEAL ECHOCARDIOGRAM (TEE);  Surgeon: Larey Dresser, MD;  Location: [place];  Service: Cardiovascular;  Laterality: N/A;     A IV  Location/Drains/Wounds Patient Lines/Drains/Airways Status   Active Line/Drains/Airways    Name:   Placement date:   Placement time:   Site:   Days:   Peripheral IV 09/07/18 Right Hand   09/07/18    0857    Hand   less than 1   Peripheral IV 09/07/18 Right Antecubital   09/07/18    0901    Antecubital   less than 1   Fistula / Graft Left Forearm Arteriovenous fistula   04/25/18    1145    Forearm   135   Hemodialysis Catheter Right Internal jugular Double-lumen   04/25/18    1246    Internal jugular   135   Incision (Closed) 04/25/18 Arm Left   04/25/18    1238     135   Incision (Closed) 04/25/18 Neck Left   04/25/18    1238     135   Incision (Closed) 07/19/18 Arm Left   07/19/18    1032     50   Pressure Injury 04/26/18 Stage II -  Partial thickness loss of dermis presenting as a shallow open ulcer with a red, pink wound bed without slough. Medical device related (condom cath from previous unit)   04/26/18    1650     134   Wound / Incision (Open or Dehisced) 04/19/18 Other (Comment) Coccyx Mid   04/19/18    1600    Coccyx   141          Intake/Output Last 24 hours  Intake/Output Summary (Last 24 hours) at 09/07/2018 1237 Last data filed at 09/07/2018 0950 Gross per 24 hour  Intake 250 ml  Output -  Net 250 ml    Labs/Imaging Results for orders placed or performed during the hospital encounter of 09/07/18 (from the past 48 hour(s))  Lactic acid, plasma     Status: Abnormal   Collection Time: 09/07/18  8:19 AM  Result Value Ref Range   Lactic Acid, Venous 4.4 (HH) 0.5 - 1.9 mmol/L    Comment: CRITICAL RESULT CALLED TO, READ BACK BY AND VERIFIED WITH: R.Damond Borchers RN @ [phone] 09/07/2018 BY C.EDENS Performed at [facility], [address], [place], [place] [address]   Comprehensive metabolic panel     Status: Abnormal   Collection Time: 09/07/18  8:19 AM  Result Value Ref Range   Sodium 138 135 - 145 mmol/L   Potassium 4.1 3.5 - 5.1 mmol/L   Chloride 100 98 - 111 mmol/L   CO2  20 (L) 22 - 32 mmol/L   Glucose, Bld 157 (H) 70 - 99 mg/dL   BUN 34 (H) 6 - 20 mg/dL   Creatinine, Ser 10.65 (H) 0.61 - 1.24 mg/dL   Calcium 9.4 8.9 - 10.3 mg/dL   Total Protein 7.5 6.5 - 8.1 g/dL   Albumin 3.0 (L) 3.5 - 5.0 g/dL   AST 37 15 - 41 U/L   ALT 38 0 - 44 U/L   Alkaline Phosphatase 193 (H) 38 - 126 U/L   Total Bilirubin 2.7 (H) 0.3 - 1.2 mg/dL   GFR  calc non Af Amer 5 (L) >60 mL/min   GFR calc Af Amer 6 (L) >60 mL/min   Anion gap 18 (H) 5 - 15    Comment: Performed at [facility] [address] [place], [place] [address]  CBC WITH DIFFERENTIAL     Status: Abnormal   Collection Time: 09/07/18  8:19 AM  Result Value Ref Range   WBC 15.9 (H) 4.0 - 10.5 K/uL   RBC 3.13 (L) 4.22 - 5.81 MIL/uL   Hemoglobin 9.2 (L) 13.0 - 17.0 g/dL   HCT 30.7 (L) 39.0 - 52.0 %   MCV 98.1 80.0 - 100.0 fL   MCH 29.4 26.0 - 34.0 pg   MCHC 30.0 30.0 - 36.0 g/dL   RDW 15.9 (H) 11.5 - 15.5 %   Platelets 288 150 - 400 K/uL   nRBC 0.3 (H) 0.0 - 0.2 %   Neutrophils Relative % 94 %   Neutro Abs 14.9 (H) 1.7 - 7.7 K/uL   Lymphocytes Relative 3 %   Lymphs Abs 0.5 (L) 0.7 - 4.0 K/uL   Monocytes Relative 1 %   Monocytes Absolute 0.2 0.1 - 1.0 K/uL   Eosinophils Relative 1 %   Eosinophils Absolute 0.2 0.0 - 0.5 K/uL   Basophils Relative 0 %   Basophils Absolute 0.0 0.0 - 0.1 K/uL   Immature Granulocytes 1 %   Abs Immature Granulocytes 0.09 (H) 0.00 - 0.07 K/uL    Comment: Performed at [facility], [address], [place], [place] [address]  SARS Coronavirus 2 (CEPHEID - Performed in [place] hospital lab), Hosp Order     Status: None   Collection Time: 09/07/18  9:07 AM   Specimen: Nasopharyngeal Swab  Result Value Ref Range   SARS Coronavirus 2 NEGATIVE NEGATIVE    Comment: (NOTE) If result is NEGATIVE SARS-CoV-2 target nucleic acids are NOT DETECTED. The SARS-CoV-2 RNA is generally detectable in upper and lower  respiratory specimens during the acute phase of infection. The  lowest  concentration of SARS-CoV-2 viral copies this assay can detect is 250  copies / mL. A negative result does not preclude SARS-CoV-2 infection  and should not be used as the sole basis for treatment or other  patient management decisions.  A negative result may occur with  improper specimen collection / handling, submission of specimen other  than nasopharyngeal swab, presence of viral mutation(s) within the  areas targeted by this assay, and inadequate number of viral copies  (<250 copies / mL). A negative result must be combined with clinical  observations, patient history, and epidemiological information. If result is POSITIVE SARS-CoV-2 target nucleic acids are DETECTED. The SARS-CoV-2 RNA is generally detectable in upper and lower  respiratory specimens dur ing the acute phase of infection.  Positive  results are indicative of active infection with SARS-CoV-2.  Clinical  correlation with patient history and other diagnostic information is  necessary to determine patient infection status.  Positive results do  not rule out bacterial infection or co-infection with other viruses. If result is PRESUMPTIVE POSTIVE SARS-CoV-2 nucleic acids MAY BE PRESENT.   A presumptive positive result was obtained on the submitted specimen  and confirmed on repeat testing.  While 2019 novel coronavirus  (SARS-CoV-2) nucleic acids may be present in the submitted sample  additional confirmatory testing may be necessary for epidemiological  and / or clinical management purposes  to differentiate between  SARS-CoV-2 and other Sarbecovirus currently known to infect humans.  If clinically indicated additional testing with an alternate test  methodology [id number]) is advised. The SARS-CoV-2 RNA is generally  detectable in upper and lower respiratory sp ecimens during the acute  phase of infection. The expected result is Negative. Fact Sheet for Patients:   StrictlyIdeas.no Fact Sheet for Healthcare Providers: BankingDealers.co.za This test is not yet approved or cleared by the [place] FDA and has been authorized for detection and/or diagnosis of SARS-CoV-2 by FDA under an Emergency Use Authorization (EUA).  This EUA will remain in effect (meaning this test can be used) for the duration of the COVID-19 declaration under Section 564(b)(1) of the Act, 21 U.S.C. section 360bbb-3(b)(1), unless the authorization is terminated or revoked sooner. Performed at [facility], [place] [address] [place], [place] [address]   Lactic acid, plasma     Status: None   Collection Time: 09/07/18 10:45 AM  Result Value Ref Range   Lactic Acid, Venous 1.0 0.5 - 1.9 mmol/L    Comment: Performed at [facility] [address] [place], [place] [address]   Ct Abdomen Pelvis Wo Contrast  Result Date: 09/07/2018 CLINICAL DATA:  Sepsis, evaluate for intra-abdominal origin or abscess EXAM: CT ABDOMEN AND PELVIS WITHOUT CONTRAST TECHNIQUE: Multidetector CT imaging of the abdomen and pelvis was performed following the standard protocol without IV contrast. COMPARISON:  04/27/2018 FINDINGS: Lower chest: No acute abnormality.  Cardiomegaly. Hepatobiliary: Coarse contour of the liver. Gallstones in the dependent gallbladder. No gallbladder wall thickening, or biliary dilatation. Pancreas: Unremarkable. No pancreatic ductal dilatation or surrounding inflammatory changes. Spleen: Normal in size without significant abnormality. Adrenals/Urinary Tract: No change in a 1.8 cm soft tissue attenuation nodule of the left adrenal gland (series 3, image 27). Kidneys are normal, without renal calculi, solid lesion, or hydronephrosis. Bladder is unremarkable. Stomach/Bowel: Stomach is within normal limits. Appendix appears normal. There is extensive small mesenteric fat stranding and numerous enlarged mesenteric lymph nodes in the  central abdomen (series 3, image 43). Vascular/Lymphatic: No significant vascular findings are present. Numerous enlarged retroperitoneal lymph nodes, increased in size compared to prior examination, measuring up to 1.9 x 1.2 cm (series 3, image 44). Reproductive: No mass or other significant abnormality. Other: No abdominal wall hernia or abnormality.  Trace ascites. Musculoskeletal: No acute or significant osseous findings. IMPRESSION: 1. Numerous enlarged small bowel mesenteric and retroperitoneal lymph nodes, new compared to prior examination. These may be reactive, for example secondary to nonspecific infectious or inflammatory enteritis, however malignancy such as lymphoma is a differential consideration. Recommend CT follow-up in 3 months to ensure stability or resolution. These may be characterized for metabolic activity by PET-CT if indicated by clinical concern for malignancy. 2.  Coarse contour of the liver, suggesting cirrhosis. 3.  Gallstones. 4.  Trace, nonspecific ascites. Electronically Signed   By: Eddie Candle M.D.   On: 09/07/2018 12:02   Dg Chest Port 1 View  Result Date: 09/07/2018 CLINICAL DATA:  Sepsis, cough. EXAM: PORTABLE CHEST 1 VIEW COMPARISON:  Chest x-ray dated 08/20/2018. FINDINGS: Stable cardiomegaly. LEFT chest wall pacemaker/ICD apparatus appears stable in position. RIGHT-sided central catheter appears stable in position with tip overlying the RIGHT atrium. Lungs are clear. No pleural effusion or pneumothorax seen. Osseous structures about the chest are unremarkable. IMPRESSION: 1. No active disease. No evidence of pneumonia or pulmonary edema. 2. Stable cardiomegaly. Electronically Signed   By: Franki Cabot M.D.   On: 09/07/2018 08:58    Pending Labs Unresulted Labs (From admission, onward)  Start     Ordered   09/07/18 0841  Blood Culture (routine x 2)  BLOOD CULTURE X 2,   STAT     09/07/18 0840   09/07/18 0841  Urinalysis, Routine w reflex microscopic  ONCE -  STAT,   STAT     09/07/18 0840          Vitals/Pain Today's Vitals   09/07/18 1000 09/07/18 1015 09/07/18 1030 09/07/18 1145  BP: 92/72 (!) 97/54 (!) 90/55 100/66  Pulse: 67   (!) 104  Resp: (!) 35 17 16 12   Temp:      TempSrc:      SpO2: 93%   95%  PainSc:        Isolation Precautions No active isolations  Medications Medications  sodium chloride 0.9 % bolus 1,000 mL (has no administration in time range)  sodium chloride 0.9 % bolus 1,000 mL (1,000 mLs Intravenous New Bag/Given 09/07/18 0913)  vancomycin (VANCOCIN) IVPB 1000 mg/200 mL premix (0 mg Intravenous Stopped 09/07/18 0950)  cefTRIAXone (ROCEPHIN) 2 g in sodium chloride 0.9 % 100 mL IVPB (0 g Intravenous Stopped 09/07/18 0950)  sodium chloride 0.9 % bolus 1,000 mL (1,000 mLs Intravenous New Bag/Given 09/07/18 0950)  metroNIDAZOLE (FLAGYL) IVPB 500 mg (500 mg Intravenous New Bag/Given 09/07/18 1106)    Mobility walks Low fall risk   Focused Assessments    R Recommendations: See Admitting Provider Note  Report given to: [place], 5W RN  Additional Notes:

## 2018-09-07 NOTE — Consult Note (Addendum)
[facility] Renal Consultation Note    Indication for Consultation:  Management of ESRD/hemodialysis; anemia, hypertension/volume and secondary hyperparathyroidism PCP:  Molli Barrows, FNP  HPI: Travis Bryan is a 48 y.o. male with ESRD secondary to cardiorenal vs ATN with a history of DM on HD since early 2020 following an acute/rehab admission with PEA arrect, human metapneumonvirus PNA/ARDs , hx afib s/p DCCV and ablation. NICM s/p ICD.  Earlier this month he was seen in a [facility] urgent care clinic for rectal abscess that appeared to be an infected external hemorrhoid that was spontaneously draining and treated with clindamycin. Per RN today on 5W - they did not see any problem when they did skin exam upon arrival to the floor.  The patient said he completed some of the pills given at that time.   He is s/p left 2nd stage BV AVF creation 07/19/18, but missed follow up appointment. He is compliant with dialysis and weights have been stable and BP on the low side.  Over the past 2 weeks he has been losing weight and EDW has been lowered 5 kg. He has not reported and fevers nor has had any documented fevers at his dialysis unit. Baseline SBP at dialysis are < 120 and generally in the 100s at best.  He lives with his aunt and uncle who buy and prepare his food.  Appetite has not been real good lately.  He presented to the ED today with a cough and generally not feeling well.   He felt weak and lightheaded but was not aware of any temperature, abdominal pain, SOB. He is not aware around of being around anyone else who is ill.   Evaluation in ED showed negative COVID, negative findings on CXR but temp elevated to 102.2 and WBC ^ to 15.9 with 94% . This is dramatically up from his monthly CBC at his dialysis unit 6/25 showed WBC 6.4  With normal diff including N at 63.4 Other labs should K 4.1 - normal LFT but increased T bili at 2.7. Abdominal CT showed numerous enlarges smal bowel mesenteric  and retroperitoneal nodes, coarse contour of liver suggestive of cirrhosis, gall stones. EKG showed sinus tach at 153 with PVCs   After cultures, he was started on flagyl, Vanc and rocephin.  He had some itching with Vanc and did not receive a full dose.  He is due for dialysis today but has no acute need based on labs or volume. We will plan to dialysis Sunday.  Past Medical History:  Diagnosis Date  . AICD (automatic cardioverter/defibrillator) present   . Atrial flutter ([place])    a. s/p ablation 03/2016  . Chronic kidney disease   . Chronic systolic CHF (congestive heart failure) ([place])   . HCAP (healthcare-associated pneumonia) 03/28/2018  . History of gout   . Hyperlipidemia   . Hypertension   . Myocardial infarction [facility])    "I think I had one a long long time ago" (03/28/2016)  . NICM (nonischemic cardiomyopathy) ([place])    a. LHC 6/06: pLAD 20, pLCx 20-30; b. Echo 5/15:  EF 15%, diffuse HK, restrictive physiology, trivial AI, trivial MR, mild LAE, moderate RVE, moderately reduced RVSF, moderate RAE, mild to moderate TR, PASP 43 mmHg  . Obesity   . Persistent atrial fibrillation   . Stroke ([place])   . Type II diabetes mellitus ([place])    Past Surgical History:  Procedure Laterality Date  . AV FISTULA PLACEMENT Left 04/25/2018   Procedure: ARTERIOVENOUS (AV)  VS GRAFT ARM;  Surgeon: Serafina Mitchell, MD;  Location[facility] OR;  Service: Vascular;  Laterality: Left;  . BASCILIC VEIN TRANSPOSITION Left 07/19/2018   Procedure: SECOND STAGE BASILIC VEIN TRANSPOSITION LEFT ARM;  Surgeon: Serafina Mitchell, MD;  Location: [place];  Service: Vascular;  Laterality: Left;  . CARDIOVERSION N/A 04/07/2016   Procedure: CARDIOVERSION;  Surgeon: Thayer Headings, MD;  Location: [facility] ENDOSCOPY;  Service: Cardiovascular;  Laterality: N/A;  . CARDIOVERSION N/A 04/11/2016   Procedure: CARDIOVERSION;  Surgeon: Larey Dresser, MD;  Location: [place];  Service: Cardiovascular;  Laterality: N/A;  . ELECTROPHYSIOLOGIC  STUDY N/A 03/31/2016   Procedure: A-Flutter Ablation;  Surgeon: Evans Lance, MD;  Location: [place] CV LAB;  Service: Cardiovascular;  Laterality: N/A;  . EXCHANGE OF A DIALYSIS CATHETER Left 04/25/2018   Procedure: Attempted Exchange Of A Dialysis Catheter on the Left side;  Surgeon: Serafina Mitchell, MD;  Location: [facility] OR;  Service: Vascular;  Laterality: Left;  . IMPLANTABLE CARDIOVERTER DEFIBRILLATOR (ICD) GENERATOR CHANGE N/A 08/27/2013   Procedure: ICD GENERATOR CHANGE;  Surgeon: Evans Lance, MD;  Location: [facility] CATH LAB;  Service: Cardiovascular;  Laterality: N/A;  . INSERTION OF DIALYSIS CATHETER Right 04/25/2018   Procedure: INSERTION OF TUNNELED DIALYSIS CATHETER;  Surgeon: Serafina Mitchell, MD;  Location: [facility] OR;  Service: Vascular;  Laterality: Right;  . TEE WITHOUT CARDIOVERSION N/A 03/31/2016   Procedure: TRANSESOPHAGEAL ECHOCARDIOGRAM (TEE);  Surgeon: Larey Dresser, MD;  Location: [facility] ENDOSCOPY;  Service: Cardiovascular;  Laterality: N/A;   Family History  Problem Relation Age of Onset  . Hypertension Mother   . Heart disease Mother   . Diabetes Mother    Social History:  reports that he has never smoked. He has never used smokeless tobacco. He reports that he does not drink alcohol or use drugs. Allergies  Allergen Reactions  . No Known Allergies    Prior to Admission medications   Medication Sig Start Date End Date Taking? Authorizing Provider  acetaminophen (TYLENOL) 500 MG tablet Take 1,000 mg by mouth every 6 (six) hours as needed for mild pain or headache.    [provider]  amiodarone (PACERONE) 200 MG tablet Take 1 tablet (200 mg total) by mouth daily. 05/30/18   Larey Dresser, MD  atorvastatin (LIPITOR) 40 MG tablet Take 1 tablet (40 mg total) by mouth daily at 6 PM. 05/13/18   [name], FNP  insulin aspart (NOVOLOG) 100 UNIT/ML injection Use 3 units three times a day with meals Patient taking differently: Inject 0-3 Units into the skin 3  (three) times daily with meals. Use 3 units three times a day with meals per sliding scale. 05/13/18 05/13/19  [name], FNP  insulin glargine (LANTUS) 100 UNIT/ML injection Inject 0.15 mLs (15 Units total) into the skin at bedtime as needed. 09/01/18   [name], FNP  Insulin Syringe-Needle U-100 31G X 15/64" 0.3 ML MISC Use to inject insulin 4 times a day 05/13/18   [name], FNP  metoprolol tartrate (LOPRESSOR) 25 MG tablet Take 1 tablet (25 mg total) by mouth 2 (two) times daily. 4/69/62   Delora Fuel, MD  multivitamin (RENA-VIT) TABS tablet TAKE 1 TABLET BY MOUTH AT BEDTIME. 07/29/18   [name], FNP  sevelamer carbonate (RENVELA) 800 MG tablet Take 1 tablet (800 mg total) by mouth 3 (three) times daily with meals. 05/13/18   [name], FNP  warfarin (COUMADIN) 5 MG  tablet Take As Directed by [facility]in Clinic Patient taking differently: Take 5-7.5 mg by mouth See admin instructions. Take As Directed by Coumadin Clinic 5 mg (5 mg x 1) every Tue, Thu, Sat; 7.5 mg (5 mg x 1.5) all other days 05/24/18   Larey Dresser, MD   Current Facility-Administered Medications  Medication Dose Route Frequency Provider Last Rate Last Dose  . sodium chloride 0.9 % bolus 1,000 mL  1,000 mL Intravenous Once Maudie Flakes, MD       Current Outpatient Medications  Medication Sig Dispense Refill  . acetaminophen (TYLENOL) 500 MG tablet Take 1,000 mg by mouth every 6 (six) hours as needed for mild pain or headache.    Marland Kitchen amiodarone (PACERONE) 200 MG tablet Take 1 tablet (200 mg total) by mouth daily. 30 tablet 0  . atorvastatin (LIPITOR) 40 MG tablet Take 1 tablet (40 mg total) by mouth daily at 6 PM. 90 tablet 2  . insulin aspart (NOVOLOG) 100 UNIT/ML injection Use 3 units three times a day with meals (Patient taking differently: Inject 0-3 Units into the skin 3 (three) times daily with meals. Use 3 units three times a day with meals per sliding scale.) 10 mL 3  . insulin  glargine (LANTUS) 100 UNIT/ML injection Inject 0.15 mLs (15 Units total) into the skin at bedtime as needed. 10 mL 5  . Insulin Syringe-Needle U-100 31G X 15/64" 0.3 ML MISC Use to inject insulin 4 times a day 200 each 12  . metoprolol tartrate (LOPRESSOR) 25 MG tablet Take 1 tablet (25 mg total) by mouth 2 (two) times daily. 60 tablet 0  . multivitamin (RENA-VIT) TABS tablet TAKE 1 TABLET BY MOUTH AT BEDTIME. 30 tablet 6  . sevelamer carbonate (RENVELA) 800 MG tablet Take 1 tablet (800 mg total) by mouth 3 (three) times daily with meals. 90 tablet 3  . warfarin (COUMADIN) 5 MG tablet Take As Directed by Coumadin Clinic (Patient taking differently: Take 5-7.5 mg by mouth See admin instructions. Take As Directed by Coumadin Clinic 5 mg (5 mg x 1) every Tue, Thu, Sat; 7.5 mg (5 mg x 1.5) all other days) 45 tablet 3   Labs:  Lab Results  Component Value Date   INR 4.0 (A) 09/05/2018   INR 5.1 (A) 08/30/2018   INR >10.0 (HH) 40/98/1191    Basic Metabolic Panel: Recent Labs  Lab 09/07/18 0819  NA 138  K 4.1  CL 100  CO2 20*  GLUCOSE 157*  BUN 34*  CREATININE 10.65*  CALCIUM 9.4   Liver Function Tests: Recent Labs  Lab 09/07/18 0819  AST 37  ALT 38  ALKPHOS 193*  BILITOT 2.7*  PROT 7.5  ALBUMIN 3.0*   CBC: Recent Labs  Lab 09/07/18 0819  WBC 15.9*  NEUTROABS 14.9*  HGB 9.2*  HCT 30.7*  MCV 98.1  PLT 288   Cardiac Enzymes: No results for input(s): CKTOTAL, CKMB, CKMBINDEX, TROPONINI in the last 168 hours. CBG: No results for input(s): GLUCAP in the last 168 hours. Iron Studies: No results for input(s): IRON, TIBC, TRANSFERRIN, FERRITIN in the last 72 hours. Studies/Results: Ct Abdomen Pelvis Wo Contrast  Result Date: 09/07/2018 CLINICAL DATA:  Sepsis, evaluate for intra-abdominal origin or abscess EXAM: CT ABDOMEN AND PELVIS WITHOUT CONTRAST TECHNIQUE: Multidetector CT imaging of the abdomen and pelvis was performed following the standard protocol without IV  contrast. COMPARISON:  04/27/2018 FINDINGS: Lower chest: No acute abnormality.  Cardiomegaly. Hepatobiliary: Coarse contour of the liver. Gallstones  in the dependent gallbladder. No gallbladder wall thickening, or biliary dilatation. Pancreas: Unremarkable. No pancreatic ductal dilatation or surrounding inflammatory changes. Spleen: Normal in size without significant abnormality. Adrenals/Urinary Tract: No change in a 1.8 cm soft tissue attenuation nodule of the left adrenal gland (series 3, image 27). Kidneys are normal, without renal calculi, solid lesion, or hydronephrosis. Bladder is unremarkable. Stomach/Bowel: Stomach is within normal limits. Appendix appears normal. There is extensive small mesenteric fat stranding and numerous enlarged mesenteric lymph nodes in the central abdomen (series 3, image 43). Vascular/Lymphatic: No significant vascular findings are present. Numerous enlarged retroperitoneal lymph nodes, increased in size compared to prior examination, measuring up to 1.9 x 1.2 cm (series 3, image 44). Reproductive: No mass or other significant abnormality. Other: No abdominal wall hernia or abnormality.  Trace ascites. Musculoskeletal: No acute or significant osseous findings. IMPRESSION: 1. Numerous enlarged small bowel mesenteric and retroperitoneal lymph nodes, new compared to prior examination. These may be reactive, for example secondary to nonspecific infectious or inflammatory enteritis, however malignancy such as lymphoma is a differential consideration. Recommend CT follow-up in 3 months to ensure stability or resolution. These may be characterized for metabolic activity by PET-CT if indicated by clinical concern for malignancy. 2.  Coarse contour of the liver, suggesting cirrhosis. 3.  Gallstones. 4.  Trace, nonspecific ascites. Electronically Signed   By: Eddie Candle M.D.   On: 09/07/2018 12:02   Dg Chest Port 1 View  Result Date: 09/07/2018 CLINICAL DATA:  Sepsis, cough. EXAM:  PORTABLE CHEST 1 VIEW COMPARISON:  Chest x-ray dated 08/20/2018. FINDINGS: Stable cardiomegaly. LEFT chest wall pacemaker/ICD apparatus appears stable in position. RIGHT-sided central catheter appears stable in position with tip overlying the RIGHT atrium. Lungs are clear. No pleural effusion or pneumothorax seen. Osseous structures about the chest are unremarkable. IMPRESSION: 1. No active disease. No evidence of pneumonia or pulmonary edema. 2. Stable cardiomegaly. Electronically Signed   By: Franki Cabot M.D.   On: 09/07/2018 08:58    ROS: As per HPI otherwise negative.  Physical Exam: Vitals:   09/07/18 1030 09/07/18 1145 09/07/18 1200 09/07/18 1215  BP: (!) 90/55 100/66 (!) 87/59 (!) 86/60  Pulse:  (!) 104 (!) 116 (!) 112  Resp: 16 12 (!) 31 (!) 23  Temp:      TempSrc:      SpO2:  95% 93% 97%     General: WDWN NAD breathing easily on room air Head: NCAT sclera not icteric MMM Neck: Supple.  Lungs: CTA bilaterally without wheezes, rales, or rhonchi. Breathing is unlabored. Heart: tachy Abdomen: soft NT + BS Lower extremities:without edema or ischemic changes, no open wounds  Neuro: A & O  X 3. Moves all extremities spontaneously. Psych:  Responds to questions appropriately with a normal affect. Dialysis Access:right IJ TDC exit site no drainage or erythema.  and left AVF maturing + bruit and thrill  Dialysis Orders: TTS [facility] 4 hr EDW 94 3 K 2.25 Ca right IJ TDC and maturing left upper AVF Aranesp/week 60 6/23 - previously 40 this past month- was to increase to 100 next week Hectorol 1 heparin 4 K  Assessment/Plan: 1. Sepsis - Fever with WBC to 15.9 with ^ N from WBC 6/25 of 6.4 with normal diff- rather acute onset. Negative COVID/CXR, recent infected ext hemorrhoid, CT showed numerous small bowel mesenteric and retroperitoneal nodes, unintentional weight loss 5 kg this month after several months of very stable weights worrisome. Trend T bili.  Antibiotics per primary 2.  ESRD -  TTS  - no acute need for HD today per labs/volume - schedule for Sunday due to high patient volume .and reduce time to 3.5 hr 3. BPn/volume  - CXR NAD - EDW recently lower to 94 - BP has been on the low side for some time - trying to ascertain his exact meds; MTP recently increased - he tolerates low BP well. 4. Anemia  - most recent outpt hgb 8.6 -9.2 today.  tsat 20% with ferritin 1906 in May - not on IV Fe - ESA has been very slows increasing - just got 60 after a month of 40/week Aranesp doses with steadily declining hgb (down from 10.9 5/21. High ferritin has obviated IV Fe - but once acute illness resolve should get a bolus - plan ^ give additional 60 today and ^ ESA to 100 next  week -  5. Metabolic bone disease -  Not on VDRA - renvela recently increased to 3 ac 6. Nutrition - renal diet/vits/supplements 7. PAF on chronic coumadin - MTP increased this month to 25 bid due to afib with RVR during ED visit 6/10; has scheduled cardioversion for 7/01  - INR elevated today at 4  8. DM -  Per primary  Myriam Jacobson, PA-C [place] [phone] 09/07/2018, 12:39 PM   Pt seen, examined and agree w A/P as above.  Kelly Splinter  MD 09/07/2018, 3:48 PM

## 2018-09-07 NOTE — Progress Notes (Signed)
Notified provider of need to administer fluid bolus. MD notified via secure chat of additional fluid needed to fully meet sepsis fluid volume. MD responded he will order additional fluids.

## 2018-09-07 NOTE — ED Notes (Signed)
Patient transported to CT 

## 2018-09-07 NOTE — ED Provider Notes (Signed)
[facility] Emergency Department Provider Note MRN:  [id number]  Arrival date & time: 09/07/18     Chief Complaint   Fever and Cough   History of Present Illness   Travis Bryan is a 48 y.o. year-old male with a history of CHF, CKD, MI presenting to the ED with chief complaint of fever and cough.  Woke up this morning feeling generally weak, had transient blurred vision, lightheadedness, has had coughing for 2 days, subjective fever, denies chest pain, no abdominal pain, no numbness or weakness to the arms or legs.  Review of Systems  A complete 10 system review of systems was obtained and all systems are negative except as noted in the HPI and PMH.   Patient's Health History    Past Medical History:  Diagnosis Date  . AICD (automatic cardioverter/defibrillator) present   . Atrial flutter ([place])    a. s/p ablation 03/2016  . Chronic kidney disease   . Chronic systolic CHF (congestive heart failure) ([place])   . HCAP (healthcare-associated pneumonia) 03/28/2018  . History of gout   . Hyperlipidemia   . Hypertension   . Myocardial infarction [facility])    "I think I had one a long long time ago" (03/28/2016)  . NICM (nonischemic cardiomyopathy) ([place])    a. LHC 6/06: pLAD 20, pLCx 20-30; b. Echo 5/15:  EF 15%, diffuse HK, restrictive physiology, trivial AI, trivial MR, mild LAE, moderate RVE, moderately reduced RVSF, moderate RAE, mild to moderate TR, PASP 43 mmHg  . Obesity   . Persistent atrial fibrillation   . Stroke ([place])   . Type II diabetes mellitus ([place])     Past Surgical History:  Procedure Laterality Date  . AV FISTULA PLACEMENT Left 04/25/2018   Procedure: ARTERIOVENOUS (AV) VS GRAFT ARM;  Surgeon: Serafina Mitchell, MD;  Location: [place];  Service: Vascular;  Laterality: Left;  . BASCILIC VEIN TRANSPOSITION Left 07/19/2018   Procedure: SECOND STAGE BASILIC VEIN TRANSPOSITION LEFT ARM;  Surgeon: Serafina Mitchell, MD;  Location: [place];  Service: Vascular;   Laterality: Left;  . CARDIOVERSION N/A 04/07/2016   Procedure: CARDIOVERSION;  Surgeon: Thayer Headings, MD;  Location: [facility] ENDOSCOPY;  Service: Cardiovascular;  Laterality: N/A;  . CARDIOVERSION N/A 04/11/2016   Procedure: CARDIOVERSION;  Surgeon: Larey Dresser, MD;  Location: [place];  Service: Cardiovascular;  Laterality: N/A;  . ELECTROPHYSIOLOGIC STUDY N/A 03/31/2016   Procedure: A-Flutter Ablation;  Surgeon: Evans Lance, MD;  Location: [place] CV LAB;  Service: Cardiovascular;  Laterality: N/A;  . EXCHANGE OF A DIALYSIS CATHETER Left 04/25/2018   Procedure: Attempted Exchange Of A Dialysis Catheter on the Left side;  Surgeon: Serafina Mitchell, MD;  Location[facility] OR;  Service: Vascular;  Laterality: Left;  . IMPLANTABLE CARDIOVERTER DEFIBRILLATOR (ICD) GENERATOR CHANGE N/A 08/27/2013   Procedure: ICD GENERATOR CHANGE;  Surgeon: Evans Lance, MD;  Location: [facility] CATH LAB;  Service: Cardiovascular;  Laterality: N/A;  . INSERTION OF DIALYSIS CATHETER Right 04/25/2018   Procedure: INSERTION OF TUNNELED DIALYSIS CATHETER;  Surgeon: Serafina Mitchell, MD;  Location: [facility] OR;  Service: Vascular;  Laterality: Right;  . TEE WITHOUT CARDIOVERSION N/A 03/31/2016   Procedure: TRANSESOPHAGEAL ECHOCARDIOGRAM (TEE);  Surgeon: Larey Dresser, MD;  Location[facility] ENDOSCOPY;  Service: Cardiovascular;  Laterality: N/A;    Family History  Problem Relation Age of Onset  . Hypertension Mother   . Heart disease Mother   . Diabetes Mother     Social History  Socioeconomic History  . Marital status: Widowed    Spouse name: Not on file  . Number of children: Not on file  . Years of education: Not on file  . Highest education level: Not on file  Occupational History  . Not on file  Social Needs  . Financial resource strain: Not on file  . Food insecurity    Worry: Not on file    Inability: Not on file  . Transportation needs    Medical: Not on file    Non-medical: Not on file  Tobacco Use  .  Smoking status: Never Smoker  . Smokeless tobacco: Never Used  . Tobacco comment: pt denies smoking cigars  Substance and Sexual Activity  . Alcohol use: No  . Drug use: No  . Sexual activity: Not Currently  Lifestyle  . Physical activity    Days per week: Not on file    Minutes per session: Not on file  . Stress: Not on file  Relationships  . Social Herbalist on phone: Not on file    Gets together: Not on file    Attends religious service: Not on file    Active member of club or organization: Not on file    Attends meetings of clubs or organizations: Not on file    Relationship status: Not on file  . Intimate partner violence    Fear of current or ex partner: Not on file    Emotionally abused: Not on file    Physically abused: Not on file    Forced sexual activity: Not on file  Other Topics Concern  . Not on file  Social History Narrative  . Not on file     Physical Exam  Vital Signs and Nursing Notes reviewed Vitals:   09/07/18 1015 09/07/18 1030  BP: (!) 97/54 (!) 90/55  Pulse:    Resp: 17 16  Temp:    SpO2:      CONSTITUTIONAL: Chronically ill-appearing, NAD NEURO:  Alert and oriented x 3, no focal deficits EYES:  eyes equal and reactive ENT/NECK:  no LAD, no JVD CARDIO: Tachycardic rate, well-perfused, normal S1 and S2 PULM:  CTAB no wheezing or rhonchi GI/GU:  normal bowel sounds, non-distended, non-tender MSK/SPINE:  No gross deformities, no edema SKIN:  no rash, atraumatic PSYCH:  Appropriate speech and behavior  Diagnostic and Interventional Summary    EKG Interpretation  Date/Time:  Saturday September 07 2018 07:56:51 EDT Ventricular Rate:  153 PR Interval:    QRS Duration: 179 QT Interval:  320 QTC Calculation: 511 R Axis:   -96 Text Interpretation:  Sinus tachycardia Ventricular premature complex Consider right atrial enlargement Nonspecific IVCD with LAD Confirmed by Gerlene Fee [phone]) on 09/07/2018 8:00:37 AM      Labs Reviewed   LACTIC ACID, PLASMA - Abnormal; Notable for the following components:      Result Value   Lactic Acid, Venous 4.4 (*)    All other components within normal limits  COMPREHENSIVE METABOLIC PANEL - Abnormal; Notable for the following components:   CO2 20 (*)    Glucose, Bld 157 (*)    BUN 34 (*)    Creatinine, Ser 10.65 (*)    Albumin 3.0 (*)    Alkaline Phosphatase 193 (*)    Total Bilirubin 2.7 (*)    GFR calc non Af Amer 5 (*)    GFR calc Af Amer 6 (*)    Anion gap 18 (*)    All  other components within normal limits  CBC WITH DIFFERENTIAL/PLATELET - Abnormal; Notable for the following components:   WBC 15.9 (*)    RBC 3.13 (*)    Hemoglobin 9.2 (*)    HCT 30.7 (*)    RDW 15.9 (*)    nRBC 0.3 (*)    Neutro Abs 14.9 (*)    Lymphs Abs 0.5 (*)    Abs Immature Granulocytes 0.09 (*)    All other components within normal limits  SARS CORONAVIRUS 2 (HOSPITAL ORDER, [place] [facility])  CULTURE, BLOOD (ROUTINE X 2)  CULTURE, BLOOD (ROUTINE X 2)  LACTIC ACID, PLASMA  URINALYSIS, ROUTINE W REFLEX MICROSCOPIC    DG Chest Port 1 View  Final Result    CT ABDOMEN PELVIS W CONTRAST    (Results Pending)    Medications  metroNIDAZOLE (FLAGYL) IVPB 500 mg (has no administration in time range)  sodium chloride 0.9 % bolus 1,000 mL (1,000 mLs Intravenous New Bag/Given 09/07/18 0913)  vancomycin (VANCOCIN) IVPB 1000 mg/200 mL premix (0 mg Intravenous Stopped 09/07/18 0950)  cefTRIAXone (ROCEPHIN) 2 g in sodium chloride 0.9 % 100 mL IVPB (0 g Intravenous Stopped 09/07/18 0950)  sodium chloride 0.9 % bolus 1,000 mL (1,000 mLs Intravenous New Bag/Given 09/07/18 0950)     Procedures Critical Care Critical Care Documentation Critical care time provided by me (excluding procedures): 39 minutes  Condition necessitating critical care: Sepsis, A. fib with RVR  Components of critical care management: reviewing of prior records, laboratory and imaging interpretation, frequent  re-examination and reassessment of vital signs, administration of IV fluids, IV antibiotics, discussion with consulting services    ED Course and Medical Decision Making  I have reviewed the triage vital signs and the nursing notes.  Pertinent labs & imaging results that were available during my care of the patient were reviewed by me and considered in my medical decision making (see below for details).  Patient is tachycardic, hypotensive, febrile, and A. fib with RVR, suspect underlying sepsis, question of pulmonary etiology, patient also had a recent abscess drained near his rectum, does not appear grossly infected today.Marland Kitchen  Antibiotics, fluids, plan for admission.  Labs reveal leukocytosis, lactate of 4.4, further suggestive of sepsis.  Chest x-ray without evidence of infection, awaiting urinalysis but patient does not make very much urine given his ESRD history.  Will obtain CT abdomen pelvis to further evaluate for source, with special attention to the soft tissue though there is nothing evident on external exam.  Will admit to medicine service.  Barth Kirks. [place]onia Small, [place] [email or web address]  Final Clinical Impressions(s) / ED Diagnoses     ICD-10-CM   1. Sepsis [facility])  A41.9 DG Chest [facility] 1 View    DG Chest [facility]    ED Discharge Orders    None         Maudie Flakes, MD 09/07/18 1050

## 2018-09-07 NOTE — H&P (Signed)
History and Physical    Travis Bryan XHB:[id number] DOB: Jan 07, 1971 DOA: 09/07/2018  Referring MD/NP/PA: Gerlene Fee, MD PCP: Travis Jun, FNP  Patient coming from: Home via EMS  Chief Complaint: Shakes and dry cough  I have personally briefly reviewed patient's old medical records in [place]   HPI: Travis Bryan is a 48 y.o. male with medical history significant of HTN, HLD, ESRD on HD, A. fib, systolic CHF, s/p AICD, and gout; who presents with complaints of having shakes and a dry cough.  History is somewhat difficult to obtain from patient.  When he woke up this morning he reported generalized malaise, shortness of breath, and subjective fever.  Other associated symptoms include some upper abdominal discomfort.  Furthermore, when he had a bowel movement this morning he noted some bleeding. He was scheduled to have hemodialysis this morning, but this session due to his symptoms.  He was seen at an urgent care 12 days ago, for rectal abscess.  From review of records area noted to be draining on its own and suspected to possibly be an infected external hemorrhoid.  Patient was given clindamycin and discharged home.  Patient reports intermittent compliance with the clindamycin.    ED Course: Upon admission into the emergency department patient was noted to be febrile up to 102.2 F, pulse up to 146 and atrial fibrillation with RVR, respirations 12-35, blood pressure 86/60, and O2 saturations maintained on room air.  Labs revealed WBCs 15.9, hemoglobin 9.2, BUN 34, creatinine 10.65, lactic acid 4.4, INR 4.  Chest x-ray was otherwise noted to be clear.  Patient was given 2 L of normal saline IV fluids, metronidazole, and Rocephin.  Patient only received half of his dose of vancomycin due to complaints of itching.  In the ED patient had a subsequent bowel movement with bright red blood noted in the toilet bowl.  CT scan of the abdomen and abdomen revealed numerous   Review of  Systems  Constitutional: Positive for chills and malaise/fatigue.  Respiratory: Positive for shortness of breath.   Gastrointestinal: Positive for abdominal pain and blood in stool. Negative for vomiting.  Genitourinary: Negative for frequency.  Musculoskeletal: Negative for falls.  Skin: Negative for rash.  Neurological: Negative for loss of consciousness.  All other systems reviewed and are negative.   Past Medical History:  Diagnosis Date  . AICD (automatic cardioverter/defibrillator) present   . Atrial flutter ([place])    a. s/p ablation 03/2016  . Chronic kidney disease   . Chronic systolic CHF (congestive heart failure) ([place])   . HCAP (healthcare-associated pneumonia) 03/28/2018  . History of gout   . Hyperlipidemia   . Hypertension   . Myocardial infarction [facility])    "I think I had one a long long time ago" (03/28/2016)  . NICM (nonischemic cardiomyopathy) ([place])    a. LHC 6/06: pLAD 20, pLCx 20-30; b. Echo 5/15:  EF 15%, diffuse HK, restrictive physiology, trivial AI, trivial MR, mild LAE, moderate RVE, moderately reduced RVSF, moderate RAE, mild to moderate TR, PASP 43 mmHg  . Obesity   . Persistent atrial fibrillation   . Stroke ([place])   . Type II diabetes mellitus ([place])     Past Surgical History:  Procedure Laterality Date  . AV FISTULA PLACEMENT Left 04/25/2018   Procedure: ARTERIOVENOUS (AV) VS GRAFT ARM;  Surgeon: Serafina [place], MD;  Location: [place];  Service: Vascular;  Laterality: Left;  . BASCILIC VEIN TRANSPOSITION Left 07/19/2018   Procedure: SECOND STAGE  BASILIC VEIN TRANSPOSITION LEFT ARM;  Surgeon: Serafina [place], MD;  Location: [place];  Service: Vascular;  Laterality: Left;  . CARDIOVERSION N/A 04/07/2016   Procedure: CARDIOVERSION;  Surgeon: Thayer Headings, MD;  Location: [facility] ENDOSCOPY;  Service: Cardiovascular;  Laterality: N/A;  . CARDIOVERSION N/A 04/11/2016   Procedure: CARDIOVERSION;  Surgeon: Larey Dresser, MD;  Location: [place];  Service:  Cardiovascular;  Laterality: N/A;  . ELECTROPHYSIOLOGIC STUDY N/A 03/31/2016   Procedure: A-Flutter Ablation;  Surgeon: Evans Lance, MD;  Location: [place] CV LAB;  Service: Cardiovascular;  Laterality: N/A;  . EXCHANGE OF A DIALYSIS CATHETER Left 04/25/2018   Procedure: Attempted Exchange Of A Dialysis Catheter on the Left side;  Surgeon: Serafina [place], MD;  Location: [facility] OR;  Service: Vascular;  Laterality: Left;  . IMPLANTABLE CARDIOVERTER DEFIBRILLATOR (ICD) GENERATOR CHANGE N/A 08/27/2013   Procedure: ICD GENERATOR CHANGE;  Surgeon: Evans Lance, MD;  Location: [facility] CATH LAB;  Service: Cardiovascular;  Laterality: N/A;  . INSERTION OF DIALYSIS CATHETER Right 04/25/2018   Procedure: INSERTION OF TUNNELED DIALYSIS CATHETER;  Surgeon: Serafina [place], MD;  Location: [facility] OR;  Service: Vascular;  Laterality: Right;  . TEE WITHOUT CARDIOVERSION N/A 03/31/2016   Procedure: TRANSESOPHAGEAL ECHOCARDIOGRAM (TEE);  Surgeon: Larey Dresser, MD;  Location: [facility] ENDOSCOPY;  Service: Cardiovascular;  Laterality: N/A;     reports that he has never smoked. He has never used smokeless tobacco. He reports that he does not drink alcohol or use drugs.  Allergies  Allergen Reactions  . No Known Allergies     Family History  Problem Relation Age of Onset  . Hypertension Mother   . Heart disease Mother   . Diabetes Mother     Prior to Admission medications   Medication Sig Start Date End Date Taking? Authorizing Provider  acetaminophen (TYLENOL) 500 MG tablet Take 1,000 mg by mouth every 6 (six) hours as needed for mild pain or headache.    [provider]  amiodarone (PACERONE) 200 MG tablet Take 1 tablet (200 mg total) by mouth daily. 05/30/18   Larey Dresser, MD  atorvastatin (LIPITOR) 40 MG tablet Take 1 tablet (40 mg total) by mouth daily at 6 PM. 05/13/18   Travis Jun, FNP  insulin aspart (NOVOLOG) 100 UNIT/ML injection Use 3 units three times a day with meals Patient taking  differently: Inject 0-3 Units into the skin 3 (three) times daily with meals. Use 3 units three times a day with meals per sliding scale. 05/13/18 05/13/19  Travis Jun, FNP  insulin glargine (LANTUS) 100 UNIT/ML injection Inject 0.15 mLs (15 Units total) into the skin at bedtime as needed. 09/01/18   Travis Jun, FNP  Insulin Syringe-Needle U-100 31G X 15/64" 0.3 ML MISC Use to inject insulin 4 times a day 05/13/18   Travis Jun, FNP  metoprolol tartrate (LOPRESSOR) 25 MG tablet Take 1 tablet (25 mg total) by mouth 2 (two) times daily. 9/76/73   Delora Fuel, MD  multivitamin (RENA-VIT) TABS tablet TAKE 1 TABLET BY MOUTH AT BEDTIME. 07/29/18   Travis Jun, FNP  sevelamer carbonate (RENVELA) 800 MG tablet Take 1 tablet (800 mg total) by mouth 3 (three) times daily with meals. 05/13/18   Travis Jun, FNP  warfarin (COUMADIN) 5 MG tablet Take As Directed by [facility] Patient taking differently: Take 5-7.5 mg by mouth See admin instructions. Take As Directed by [facility] 5 mg (5 mg x  1) every Tue, Thu, Sat; 7.5 mg (5 mg x 1.5) all other days 05/24/18   Larey Dresser, MD    Physical Exam:  Constitutional: Middle-age male who appears sick, but no acute distress Vitals:   09/07/18 1000 09/07/18 1015 09/07/18 1030 09/07/18 1145  BP: 92/72 (!) 97/54 (!) 90/55 100/66  Pulse: 67   (!) 104  Resp: (!) 35 17 16 12   Temp:      TempSrc:      SpO2: 93%   95%   Eyes: PERRL, lids and conjunctivae normal ENMT: Mucous membranes are moist. Posterior pharynx clear of any exudate or lesions.  Neck: normal, supple, no masses, no thyromegaly Respiratory: clear to auscultation bilaterally, no wheezing, no crackles. Normal respiratory effort. No accessory muscle use.  Cardiovascular: Regular rate and rhythm, no murmurs / rubs / gallops. No extremity edema. 2+ pedal pulses. No carotid bruits.  Abdomen: no tenderness, no masses palpated. No hepatosplenomegaly. Bowel sounds  positive.  Rectal exam: Does not show any gross abscess or erythema or external hemorrhoid. Musculoskeletal: no clubbing / cyanosis. No joint deformity upper and lower extremities. Good ROM, no contractures. Normal muscle tone.  Skin: no rashes, lesions, ulcers. No induration Neurologic: CN 2-12 grossly intact. Sensation intact, DTR normal. Strength 5/5 in all 4.  Psychiatric: Normal judgment and insight. Alert and oriented x 3. Normal mood.     Labs on Admission: I have personally reviewed following labs and imaging studies  CBC: Recent Labs  Lab 09/07/18 0819  WBC 15.9*  NEUTROABS 14.9*  HGB 9.2*  HCT 30.7*  MCV 98.1  PLT 419   Basic Metabolic Panel: Recent Labs  Lab 09/07/18 0819  NA 138  K 4.1  CL 100  CO2 20*  GLUCOSE 157*  BUN 34*  CREATININE 10.65*  CALCIUM 9.4   GFR: Estimated Creatinine Clearance: 10.2 mL/min (A) (by C-G formula based on SCr of 10.65 mg/dL (H)). Liver Function Tests: Recent Labs  Lab 09/07/18 0819  AST 37  ALT 38  ALKPHOS 193*  BILITOT 2.7*  PROT 7.5  ALBUMIN 3.0*   No results for input(s): LIPASE, AMYLASE in the last 168 hours. No results for input(s): AMMONIA in the last 168 hours. Coagulation Profile: Recent Labs  Lab 09/05/18 1445  INR 4.0*   Cardiac Enzymes: No results for input(s): CKTOTAL, CKMB, CKMBINDEX, TROPONINI in the last 168 hours. BNP (last 3 results) No results for input(s): PROBNP in the last 8760 hours. HbA1C: No results for input(s): HGBA1C in the last 72 hours. CBG: No results for input(s): GLUCAP in the last 168 hours. Lipid Profile: No results for input(s): CHOL, HDL, LDLCALC, TRIG, CHOLHDL, LDLDIRECT in the last 72 hours. Thyroid Function Tests: No results for input(s): TSH, T4TOTAL, FREET4, T3FREE, THYROIDAB in the last 72 hours. Anemia Panel: No results for input(s): VITAMINB12, FOLATE, FERRITIN, TIBC, IRON, RETICCTPCT in the last 72 hours. Urine analysis:    Component Value Date/Time    COLORURINE YELLOW 03/28/2018 1040   APPEARANCEUR CLEAR 03/28/2018 1040   LABSPEC 1.015 03/28/2018 1040   PHURINE 7.0 03/28/2018 1040   GLUCOSEU 50 (A) 03/28/2018 1040   HGBUR NEGATIVE 03/28/2018 1040   BILIRUBINUR NEGATIVE 03/28/2018 1040   KETONESUR NEGATIVE 03/28/2018 1040   PROTEINUR 100 (A) 03/28/2018 1040   UROBILINOGEN 0.2 12/18/2006 0428   NITRITE NEGATIVE 03/28/2018 1040   LEUKOCYTESUR NEGATIVE 03/28/2018 1040   Sepsis Labs: Recent Results (from the past 240 hour(s))  SARS Coronavirus 2 (CEPHEID - Performed in [place]  hospital lab), Hosp Order     Status: None   Collection Time: 09/07/18  9:07 AM   Specimen: Nasopharyngeal Swab  Result Value Ref Range Status   SARS Coronavirus 2 NEGATIVE NEGATIVE Final    Comment: (NOTE) If result is NEGATIVE SARS-CoV-2 target nucleic acids are NOT DETECTED. The SARS-CoV-2 RNA is generally detectable in upper and lower  respiratory specimens during the acute phase of infection. The lowest  concentration of SARS-CoV-2 viral copies this assay can detect is 250  copies / mL. A negative result does not preclude SARS-CoV-2 infection  and should not be used as the sole basis for treatment or other  patient management decisions.  A negative result may occur with  improper specimen collection / handling, submission of specimen other  than nasopharyngeal swab, presence of viral mutation(s) within the  areas targeted by this assay, and inadequate number of viral copies  (<250 copies / mL). A negative result must be combined with clinical  observations, patient history, and epidemiological information. If result is POSITIVE SARS-CoV-2 target nucleic acids are DETECTED. The SARS-CoV-2 RNA is generally detectable in upper and lower  respiratory specimens dur ing the acute phase of infection.  Positive  results are indicative of active infection with SARS-CoV-2.  Clinical  correlation with patient history and other diagnostic information is   necessary to determine patient infection status.  Positive results do  not rule out bacterial infection or co-infection with other viruses. If result is PRESUMPTIVE POSTIVE SARS-CoV-2 nucleic acids MAY BE PRESENT.   A presumptive positive result was obtained on the submitted specimen  and confirmed on repeat testing.  While 2019 novel coronavirus  (SARS-CoV-2) nucleic acids may be present in the submitted sample  additional confirmatory testing may be necessary for epidemiological  and / or clinical management purposes  to differentiate between  SARS-CoV-2 and other Sarbecovirus currently known to infect humans.  If clinically indicated additional testing with an alternate test  methodology [phone]) is advised. The SARS-CoV-2 RNA is generally  detectable in upper and lower respiratory sp ecimens during the acute  phase of infection. The expected result is Negative. Fact Sheet for Patients:  StrictlyIdeas.no Fact Sheet for Healthcare Providers: BankingDealers.co.za This test is not yet approved or cleared by the [place] FDA and has been authorized for detection and/or diagnosis of SARS-CoV-2 by FDA under an Emergency Use Authorization (EUA).  This EUA will remain in effect (meaning this test can be used) for the duration of the COVID-19 declaration under Section 564(b)(1) of the Act, 21 U.S.C. section 360bbb-3(b)(1), unless the authorization is terminated or revoked sooner. Performed at [facility], [place] [address] [place], [place] [address]      Radiological Exams on Admission: Ct Abdomen Pelvis Wo Contrast  Result Date: 09/07/2018 CLINICAL DATA:  Sepsis, evaluate for intra-abdominal origin or abscess EXAM: CT ABDOMEN AND PELVIS WITHOUT CONTRAST TECHNIQUE: Multidetector CT imaging of the abdomen and pelvis was performed following the standard protocol without IV contrast. COMPARISON:  04/27/2018 FINDINGS: Lower chest: No  acute abnormality.  Cardiomegaly. Hepatobiliary: Coarse contour of the liver. Gallstones in the dependent gallbladder. No gallbladder wall thickening, or biliary dilatation. Pancreas: Unremarkable. No pancreatic ductal dilatation or surrounding inflammatory changes. Spleen: Normal in size without significant abnormality. Adrenals/Urinary Tract: No change in a 1.8 cm soft tissue attenuation nodule of the left adrenal gland (series 3, image 27). Kidneys are normal, without renal calculi, solid lesion, or hydronephrosis. Bladder is unremarkable. Stomach/Bowel: Stomach is within normal  limits. Appendix appears normal. There is extensive small mesenteric fat stranding and numerous enlarged mesenteric lymph nodes in the central abdomen (series 3, image 43). Vascular/Lymphatic: No significant vascular findings are present. Numerous enlarged retroperitoneal lymph nodes, increased in size compared to prior examination, measuring up to 1.9 x 1.2 cm (series 3, image 44). Reproductive: No mass or other significant abnormality. Other: No abdominal wall hernia or abnormality.  Trace ascites. Musculoskeletal: No acute or significant osseous findings. IMPRESSION: 1. Numerous enlarged small bowel mesenteric and retroperitoneal lymph nodes, new compared to prior examination. These may be reactive, for example secondary to nonspecific infectious or inflammatory enteritis, however malignancy such as lymphoma is a differential consideration. Recommend CT follow-up in 3 months to ensure stability or resolution. These may be characterized for metabolic activity by PET-CT if indicated by clinical concern for malignancy. 2.  Coarse contour of the liver, suggesting cirrhosis. 3.  Gallstones. 4.  Trace, nonspecific ascites. Electronically Signed   By: Eddie Candle M.D.   On: 09/07/2018 12:02   Dg Chest Port 1 View  Result Date: 09/07/2018 CLINICAL DATA:  Sepsis, cough. EXAM: PORTABLE CHEST 1 VIEW COMPARISON:  Chest x-ray dated 08/20/2018.  FINDINGS: Stable cardiomegaly. LEFT chest wall pacemaker/ICD apparatus appears stable in position. RIGHT-sided central catheter appears stable in position with tip overlying the RIGHT atrium. Lungs are clear. No pleural effusion or pneumothorax seen. Osseous structures about the chest are unremarkable. IMPRESSION: 1. No active disease. No evidence of pneumonia or pulmonary edema. 2. Stable cardiomegaly. Electronically Signed   By: Franki Cabot M.D.   On: 09/07/2018 08:58    EKG: Independently reviewed.  Sinus tachycardia 153 bpm.  Assessment/Plan Severe sepsis: Acute.  Patient presents with fever, tachycardia, and hypotension.  Labs revealed WBC 15.9 and initial lactic acid 4.4.  Imaging studies revealed multiple enlarged small bowel mesenteric and retroperitoneal lymph nodes that are new concerning for infectious or inflammatory process.  Patient was given 2 L normal saline IV fluids and empiric antibiotics of Rocephin and metronidazole.  Vancomycin was stopped due to itching.  Repeat lactic acid was noted to be within normal limits.  -Admit to a progressive bed -Follow-up blood cultures -Continue Rocephin and metronidazole -Tylenol as needed for fever  Rectal bleeding, question acute blood loss anemia: Acute.  Patient reports having rectal cyst that has been draining on its own.  On physical exam no visible abscess seen draining.  Hemoglobin 9.2 g/dL which appear similar previous 18 days ago.  Question infectious/inflammatory cause versus  internal hemorrhoid. -Type and screen for possible need of blood products -Recheck H&H  -Monitor intake and output -GI consulted, will follow for further recommendation  Persistent atrial fibrillation, supratherapeutic INR: Patient initially noted to have heart rates into the 140s in atrial fibrillation, but appear to be improving with IV fluids.  INR noted to be supratherapeutic at 4. -Give 5 mg of vitamin K IV x1 dose now -Daily PT/INR -Continue  amiodarone   Hypotension: Acute.  Blood pressures initially noted to be as low as 86/60.  Patient given 2 L of normal saline IV fluids. -Hold metoprolol -Give additional IV fluids as needed  ESRD on HD: Patient normally dialyzes Tuesday, Thursday, Saturday.  He missed his session today due to his symptoms.  Potassium 4.1, BUN 34, creatinine 10.64. -Continue renvela -Consulted nephrology for likely need of hemodialysis  Systolic congestive heart failure, status post AICD: Last EF noted to be 25 to 30% by echocardiogram in 02/2018. -Strict intake and output  Diabetes  mellitus type 2: Last hemoglobin A1c relatively controlled on 08/28/2018 at 6.6. -Hypoglycemic protocols -Held Lantus at night due to possible need of procedure in a.m. -CBGs with sensitive SSI   DVT prophylaxis: SCDs Code Status: Full Family Communication: No family present at bedside Disposition Plan: Likely discharge home in 2 to 3 days Consults called: Gastroenterology, nephrology Admission status: Inpatient  Norval [name] MD [facility] Pager [phone]   If 7PM-7AM, please contact night-coverage [email or web address] Password [email or web address]  09/07/2018, 12:10 PM

## 2018-09-07 NOTE — Progress Notes (Signed)
Notified bedside nurse of need to administer fluid bolus. Pt still had one liter of fluid that was ordered that has not been given in the ED. The patients BP is still soft and to meet full fluid volume the patient needs all fluids ordered. I called progressive unit to speak with beside Rn to ensure patient would get this last liter.

## 2018-09-07 NOTE — ED Notes (Signed)
Pt complaining of itching at IV site where vancomycin being administered. This RN stopped infusion and made provider aware.

## 2018-09-07 NOTE — Consult Note (Signed)
[facility] CONSULT Reason for consult:Rectal Bleeding Referring Physician: [facility] hospitalist  Travis Bryan is an 48 y.o. male.  HPI: He has a history of an AICD and has had atrial flutter with prior ablation and is anticoagulated.  He has diabetes and acute renal failure on hemodialysis.  He has a history of myocardial infarctions nonischemic cardiomyopathy systolic CHF.  He has had multiple vascular procedures for dialysis access.  He was seen at the [facility] urgent care approximately 10 days ago with anal pain and some rectal bleeding.  Evaluation at that time showed thrombosed hemorrhoid on the left side did not appear to be external to it was draining a small amount of blood and pus and somewhat tender.  Was treated with sitz baths and clindamycin.  He notes intermittent compliance with a clindamycin but felt this was doing better. Presented to the emergency room with pulse rate 146 A. fib with RVR WBC 15.9 temperature 102.  He was given antibiotics for possible sepsis.  He had a bowel movement with some bright blood noted in the toilet bowl around his bowel movement.  We are asked to see him for possible perirectal abscess.  Patient notes that the pain in his anal area has improved but he still continued to have some bleeding and some constipation.  He denies ever having had any kind of endoscopic evaluation of his colon  Past Medical History:  Diagnosis Date  . AICD (automatic cardioverter/defibrillator) present   . Atrial flutter ([place])    a. s/p ablation 03/2016  . Chronic kidney disease   . Chronic systolic CHF (congestive heart failure) ([place])   . HCAP (healthcare-associated pneumonia) 03/28/2018  . History of gout   . Hyperlipidemia   . Hypertension   . Myocardial infarction [facility])    "I think I had one a long long time ago" (03/28/2016)  . NICM (nonischemic cardiomyopathy) ([place])    a. LHC 6/06: pLAD 20, pLCx 20-30; b. Echo 5/15:  EF 15%, diffuse HK, restrictive physiology,  trivial AI, trivial MR, mild LAE, moderate RVE, moderately reduced RVSF, moderate RAE, mild to moderate TR, PASP 43 mmHg  . Obesity   . Persistent atrial fibrillation   . Stroke ([place])   . Type II diabetes mellitus ([place])     Past Surgical History:  Procedure Laterality Date  . AV FISTULA PLACEMENT Left 04/25/2018   Procedure: ARTERIOVENOUS (AV) VS GRAFT ARM;  Surgeon: Serafina Mitchell, MD;  Location: [place];  Service: Vascular;  Laterality: Left;  . BASCILIC VEIN TRANSPOSITION Left 07/19/2018   Procedure: SECOND STAGE BASILIC VEIN TRANSPOSITION LEFT ARM;  Surgeon: Serafina Mitchell, MD;  Location: [place];  Service: Vascular;  Laterality: Left;  . CARDIOVERSION N/A 04/07/2016   Procedure: CARDIOVERSION;  Surgeon: Thayer Headings, MD;  Location: [facility] ENDOSCOPY;  Service: Cardiovascular;  Laterality: N/A;  . CARDIOVERSION N/A 04/11/2016   Procedure: CARDIOVERSION;  Surgeon: Larey Dresser, MD;  Location: [place];  Service: Cardiovascular;  Laterality: N/A;  . ELECTROPHYSIOLOGIC STUDY N/A 03/31/2016   Procedure: A-Flutter Ablation;  Surgeon: Evans Lance, MD;  Location: [place] CV LAB;  Service: Cardiovascular;  Laterality: N/A;  . EXCHANGE OF A DIALYSIS CATHETER Left 04/25/2018   Procedure: Attempted Exchange Of A Dialysis Catheter on the Left side;  Surgeon: Serafina Mitchell, MD;  Location: [facility] OR;  Service: Vascular;  Laterality: Left;  . IMPLANTABLE CARDIOVERTER DEFIBRILLATOR (ICD) GENERATOR CHANGE N/A 08/27/2013   Procedure: ICD GENERATOR CHANGE;  Surgeon: [name]  Lovena Le, MD;  Location: [facility] CATH LAB;  Service: Cardiovascular;  Laterality: N/A;  . INSERTION OF DIALYSIS CATHETER Right 04/25/2018   Procedure: INSERTION OF TUNNELED DIALYSIS CATHETER;  Surgeon: Serafina Mitchell, MD;  Location: [facility] OR;  Service: Vascular;  Laterality: Right;  . TEE WITHOUT CARDIOVERSION N/A 03/31/2016   Procedure: TRANSESOPHAGEAL ECHOCARDIOGRAM (TEE);  Surgeon: Larey Dresser, MD;  Location: [facility] ENDOSCOPY;  Service:  Cardiovascular;  Laterality: N/A;    Family History  Problem Relation Age of Onset  . Hypertension Mother   . Heart disease Mother   . Diabetes Mother     Social History:  reports that he has never smoked. He has never used smokeless tobacco. He reports that he does not drink alcohol or use drugs.  Allergies:  Allergies  Allergen Reactions  . No Known Allergies     Medications; Prior to Admission medications   Medication Sig Start Date End Date Taking? Authorizing Provider  acetaminophen (TYLENOL) 500 MG tablet Take 1,000 mg by mouth every 6 (six) hours as needed for mild pain or headache.    [provider]  amiodarone (PACERONE) 200 MG tablet Take 1 tablet (200 mg total) by mouth daily. 05/30/18   Larey Dresser, MD  atorvastatin (LIPITOR) 40 MG tablet Take 1 tablet (40 mg total) by mouth daily at 6 PM. 05/13/18   [name], FNP  insulin aspart (NOVOLOG) 100 UNIT/ML injection Use 3 units three times a day with meals Patient taking differently: Inject 0-3 Units into the skin 3 (three) times daily with meals. Use 3 units three times a day with meals per sliding scale. 05/13/18 05/13/19  [name], FNP  insulin glargine (LANTUS) 100 UNIT/ML injection Inject 0.15 mLs (15 Units total) into the skin at bedtime as needed. 09/01/18   [name], FNP  Insulin Syringe-Needle U-100 31G X 15/64" 0.3 ML MISC Use to inject insulin 4 times a day 05/13/18   [name], FNP  metoprolol tartrate (LOPRESSOR) 25 MG tablet Take 1 tablet (25 mg total) by mouth 2 (two) times daily. 3/55/73   Delora Fuel, MD  multivitamin (RENA-VIT) TABS tablet TAKE 1 TABLET BY MOUTH AT BEDTIME. 07/29/18   [name], FNP  sevelamer carbonate (RENVELA) 800 MG tablet Take 1 tablet (800 mg total) by mouth 3 (three) times daily with meals. 05/13/18   [name], FNP  warfarin (COUMADIN) 5 MG tablet Take As Directed by [facility] Patient taking differently: Take 5-7.5 mg by  mouth See admin instructions. Take As Directed by [facility] 5 mg (5 mg x 1) every Tue, Thu, Sat; 7.5 mg (5 mg x 1.5) all other days 05/24/18   Larey Dresser, MD   . amiodarone  200 mg Oral Daily  . atorvastatin  40 mg Oral q1800  . [START ON 09/08/2018] Chlorhexidine Gluconate Cloth  6 each Topical Q0600  . [START ON 09/10/2018] darbepoetin (ARANESP) injection - DIALYSIS  100 mcg Intravenous Q Tue-HD  . darbepoetin (ARANESP) injection - DIALYSIS  60 mcg Intravenous Once in dialysis  . [START ON 09/10/2018] doxercalciferol  1 mcg Intravenous Q T,Th,Sa-HD  . [START ON 09/08/2018] doxercalciferol  1 mcg Intravenous Once in dialysis  . insulin aspart  0-9 Units Subcutaneous TID WC  . multivitamin  1 tablet Oral QHS  . sevelamer carbonate  2,400 mg Oral TID WC  . sodium chloride flush  3 mL Intravenous Q12H   PRN Meds acetaminophen **OR** acetaminophen, albuterol, ondansetron **  OR** ondansetron (ZOFRAN) IV Results for orders placed or performed during the hospital encounter of 09/07/18 (from the past 48 hour(s))  Lactic acid, plasma     Status: Abnormal   Collection Time: 09/07/18  8:19 AM  Result Value Ref Range   Lactic Acid, Venous 4.4 (HH) 0.5 - 1.9 mmol/L    Comment: CRITICAL RESULT CALLED TO, READ BACK BY AND VERIFIED WITH: R.HARDY RN @ [phone] 09/07/2018 BY C.EDENS Performed at [facility] [address], [place], [place] [address]   Comprehensive metabolic panel     Status: Abnormal   Collection Time: 09/07/18  8:19 AM  Result Value Ref Range   Sodium 138 135 - 145 mmol/L   Potassium 4.1 3.5 - 5.1 mmol/L   Chloride 100 98 - 111 mmol/L   CO2 20 (L) 22 - 32 mmol/L   Glucose, Bld 157 (H) 70 - 99 mg/dL   BUN 34 (H) 6 - 20 mg/dL   Creatinine, Ser 10.65 (H) 0.61 - 1.24 mg/dL   Calcium 9.4 8.9 - 10.3 mg/dL   Total Protein 7.5 6.5 - 8.1 g/dL   Albumin 3.0 (L) 3.5 - 5.0 g/dL   AST 37 15 - 41 U/L   ALT 38 0 - 44 U/L   Alkaline Phosphatase 193 (H) 38 - 126 U/L   Total  Bilirubin 2.7 (H) 0.3 - 1.2 mg/dL   GFR calc non Af Amer 5 (L) >60 mL/min   GFR calc Af Amer 6 (L) >60 mL/min   Anion gap 18 (H) 5 - 15    Comment: Performed at [facility], [place] [address], [place], [place] [address]  CBC WITH DIFFERENTIAL     Status: Abnormal   Collection Time: 09/07/18  8:19 AM  Result Value Ref Range   WBC 15.9 (H) 4.0 - 10.5 K/uL   RBC 3.13 (L) 4.22 - 5.81 MIL/uL   Hemoglobin 9.2 (L) 13.0 - 17.0 g/dL   HCT 30.7 (L) 39.0 - 52.0 %   MCV 98.1 80.0 - 100.0 fL   MCH 29.4 26.0 - 34.0 pg   MCHC 30.0 30.0 - 36.0 g/dL   RDW 15.9 (H) 11.5 - 15.5 %   Platelets 288 150 - 400 K/uL   nRBC 0.3 (H) 0.0 - 0.2 %   Neutrophils Relative % 94 %   Neutro Abs 14.9 (H) 1.7 - 7.7 K/uL   Lymphocytes Relative 3 %   Lymphs Abs 0.5 (L) 0.7 - 4.0 K/uL   Monocytes Relative 1 %   Monocytes Absolute 0.2 0.1 - 1.0 K/uL   Eosinophils Relative 1 %   Eosinophils Absolute 0.2 0.0 - 0.5 K/uL   Basophils Relative 0 %   Basophils Absolute 0.0 0.0 - 0.1 K/uL   Immature Granulocytes 1 %   Abs Immature Granulocytes 0.09 (H) 0.00 - 0.07 K/uL    Comment: Performed at [facility], [address], [place], [place] [address]  SARS Coronavirus 2 (CEPHEID - Performed in [place] hospital lab), Hosp Order     Status: None   Collection Time: 09/07/18  9:07 AM   Specimen: Nasopharyngeal Swab  Result Value Ref Range   SARS Coronavirus 2 NEGATIVE NEGATIVE    Comment: (NOTE) If result is NEGATIVE SARS-CoV-2 target nucleic acids are NOT DETECTED. The SARS-CoV-2 RNA is generally detectable in upper and lower  respiratory specimens during the acute phase of infection. The lowest  concentration of SARS-CoV-2 viral copies this assay can detect is 250  copies / mL. A negative result does not preclude SARS-CoV-2 infection  and should not be used as the sole basis for treatment or other  patient management decisions.  A negative result may occur with  improper specimen collection / handling,  submission of specimen other  than nasopharyngeal swab, presence of viral mutation(s) within the  areas targeted by this assay, and inadequate number of viral copies  (<250 copies / mL). A negative result must be combined with clinical  observations, patient history, and epidemiological information. If result is POSITIVE SARS-CoV-2 target nucleic acids are DETECTED. The SARS-CoV-2 RNA is generally detectable in upper and lower  respiratory specimens dur ing the acute phase of infection.  Positive  results are indicative of active infection with SARS-CoV-2.  Clinical  correlation with patient history and other diagnostic information is  necessary to determine patient infection status.  Positive results do  not rule out bacterial infection or co-infection with other viruses. If result is PRESUMPTIVE POSTIVE SARS-CoV-2 nucleic acids MAY BE PRESENT.   A presumptive positive result was obtained on the submitted specimen  and confirmed on repeat testing.  While 2019 novel coronavirus  (SARS-CoV-2) nucleic acids may be present in the submitted sample  additional confirmatory testing may be necessary for epidemiological  and / or clinical management purposes  to differentiate between  SARS-CoV-2 and other Sarbecovirus currently known to infect humans.  If clinically indicated additional testing with an alternate test  methodology [id number]) is advised. The SARS-CoV-2 RNA is generally  detectable in upper and lower respiratory sp ecimens during the acute  phase of infection. The expected result is Negative. Fact Sheet for Patients:  StrictlyIdeas.no Fact Sheet for Healthcare Providers: BankingDealers.co.za This test is not yet approved or cleared by the [place] FDA and has been authorized for detection and/or diagnosis of SARS-CoV-2 by FDA under an Emergency Use Authorization (EUA).  This EUA will remain in effect (meaning this test can be  used) for the duration of the COVID-19 declaration under Section 564(b)(1) of the Act, 21 U.S.C. section 360bbb-3(b)(1), unless the authorization is terminated or revoked sooner. Performed at [facility], [place] [address], [place], [place] [address]   Lactic acid, plasma     Status: None   Collection Time: 09/07/18 10:45 AM  Result Value Ref Range   Lactic Acid, Venous 1.0 0.5 - 1.9 mmol/L    Comment: Performed at [facility] [address], [place], [place] [address]  Lactic acid, plasma     Status: None   Collection Time: 09/07/18  1:43 PM  Result Value Ref Range   Lactic Acid, Venous 1.4 0.5 - 1.9 mmol/L    Comment: Performed at [facility] [address] [place], [place] [address]  Hemoglobin and hematocrit, blood     Status: Abnormal   Collection Time: 09/07/18  1:43 PM  Result Value Ref Range   Hemoglobin 8.5 (L) 13.0 - 17.0 g/dL   HCT 27.5 (L) 39.0 - 52.0 %    Comment: Performed at [facility], [place] [address], [place], [place] [address]  Type and screen [place]     Status: None   Collection Time: 09/07/18  1:43 PM  Result Value Ref Range   ABO/RH(D) O POS    Antibody Screen NEG    Sample Expiration      09/10/2018,2359 Performed at [facility], [place] [address], [place], [place] [address]   Procalcitonin  Status: None   Collection Time: 09/07/18  1:43 PM  Result Value Ref Range   Procalcitonin 74.66 ng/mL    Comment:        Interpretation: PCT >= 10 ng/mL: Important systemic inflammatory response, almost exclusively due to severe bacterial sepsis or septic shock. (NOTE)       Sepsis PCT Algorithm           Lower Respiratory Tract                                      Infection PCT Algorithm    ----------------------------     ----------------------------         PCT < 0.25 ng/mL                PCT < 0.10 ng/mL         Strongly encourage             Strongly discourage   discontinuation of antibiotics     initiation of antibiotics    ----------------------------     -----------------------------       PCT 0.25 - 0.50 ng/mL            PCT 0.10 - 0.25 ng/mL               OR       >80% decrease in PCT            Discourage initiation of                                            antibiotics      Encourage discontinuation           of antibiotics    ----------------------------     -----------------------------         PCT >= 0.50 ng/mL              PCT 0.26 - 0.50 ng/mL                AND       <80% decrease in PCT             Encourage initiation of                                             antibiotics       Encourage continuation           of antibiotics    ----------------------------     -----------------------------        PCT >= 0.50 ng/mL                  PCT > 0.50 ng/mL               AND         increase in PCT                  Strongly encourage                                      initiation of antibiotics  Strongly encourage escalation           of antibiotics                                     -----------------------------                                           PCT <= 0.25 ng/mL                                                 OR                                        > 80% decrease in PCT                                     Discontinue / Do not initiate                                             antibiotics Performed at [facility], [place] [address] [place], [place] [address]     Ct Abdomen Pelvis Wo Contrast  Result Date: 09/07/2018 CLINICAL DATA:  Sepsis, evaluate for intra-abdominal origin or abscess EXAM: CT ABDOMEN AND PELVIS WITHOUT CONTRAST TECHNIQUE: Multidetector CT imaging of the abdomen and pelvis was performed following the standard protocol without IV contrast. COMPARISON:  04/27/2018 FINDINGS: Lower chest: No acute abnormality.  Cardiomegaly. Hepatobiliary: Coarse contour of the liver. Gallstones in the dependent gallbladder. No gallbladder  wall thickening, or biliary dilatation. Pancreas: Unremarkable. No pancreatic ductal dilatation or surrounding inflammatory changes. Spleen: Normal in size without significant abnormality. Adrenals/Urinary Tract: No change in a 1.8 cm soft tissue attenuation nodule of the left adrenal gland (series 3, image 27). Kidneys are normal, without renal calculi, solid lesion, or hydronephrosis. Bladder is unremarkable. Stomach/Bowel: Stomach is within normal limits. Appendix appears normal. There is extensive small mesenteric fat stranding and numerous enlarged mesenteric lymph nodes in the central abdomen (series 3, image 43). Vascular/Lymphatic: No significant vascular findings are present. Numerous enlarged retroperitoneal lymph nodes, increased in size compared to prior examination, measuring up to 1.9 x 1.2 cm (series 3, image 44). Reproductive: No mass or other significant abnormality. Other: No abdominal wall hernia or abnormality.  Trace ascites. Musculoskeletal: No acute or significant osseous findings. IMPRESSION: 1. Numerous enlarged small bowel mesenteric and retroperitoneal lymph nodes, new compared to prior examination. These may be reactive, for example secondary to nonspecific infectious or inflammatory enteritis, however malignancy such as lymphoma is a differential consideration. Recommend CT follow-up in 3 months to ensure stability or resolution. These may be characterized for metabolic activity by PET-CT if indicated by clinical concern for malignancy. 2.  Coarse contour of the liver, suggesting cirrhosis. 3.  Gallstones. 4.  Trace, nonspecific ascites. Electronically Signed   By: Eddie Candle M.D.   On: 09/07/2018 12:02  Dg Chest Port 1 View  Result Date: 09/07/2018 CLINICAL DATA:  Sepsis, cough. EXAM: PORTABLE CHEST 1 VIEW COMPARISON:  Chest x-ray dated 08/20/2018. FINDINGS: Stable cardiomegaly. LEFT chest wall pacemaker/ICD apparatus appears stable in position. RIGHT-sided central catheter  appears stable in position with tip overlying the RIGHT atrium. Lungs are clear. No pleural effusion or pneumothorax seen. Osseous structures about the chest are unremarkable. IMPRESSION: 1. No active disease. No evidence of pneumonia or pulmonary edema. 2. Stable cardiomegaly. Electronically Signed   By: Franki Cabot M.D.   On: 09/07/2018 08:58               Blood pressure 90/67, pulse 91, temperature 98.1 F (36.7 C), resp. rate (!) 22, SpO2 92 %.  Physical exam:   General--somewhat obese African-American male in no distress ENT--nonicteric Neck--no thyroid masses Heart--irregular heartbeat somewhat tachycardic Lungs--clear Abdomen--soft and nontender. Rectal- examination of the perianal area shows no abscess on the left side no gross hemorrhoids with straining.  I did not do a rectal exam at his request but carefully palpated the perianal area and could not elicit any areas of tenderness fluctuance etc. Psych--somewhat slow but answers questions appropriately  Assessment: 1.  Rectal bleed.  Patient had recent infected hemorrhoid that appears to resolve.  He is likely bleeding from hemorrhoids exacerbated by his anticoagulation.  I cannot elicit any particular problems on exam today. 2.  Constipation 3.  History of atrial fib status post multiple procedures chronically anticoagulated 4.  AICD 5.  Hemodialysis patient 6.  Possible sepsis Plan: At this point I would keep him on MiraLAX to soften his stools.  Is felt that he may be septic so which this is more fully explained and treated if he is still bleeding we could consider flexible sigmoidoscopy or colonoscopy. We will follow with you.   Nancy Fetter 09/07/2018, 3:40 PM   This note was created using voice recognition software and minor errors may Have occurred unintentionally. Pager: [phone] If no answer or after hours call [phone]

## 2018-09-07 NOTE — ED Notes (Signed)
Portable cxr at bedside

## 2018-09-08 LAB — RENAL FUNCTION PANEL
Albumin: 2.6 g/dL — ABNORMAL LOW (ref 3.5–5.0)
Anion gap: 14 (ref 5–15)
BUN: 43 mg/dL — ABNORMAL HIGH (ref 6–20)
CO2: 19 mmol/L — ABNORMAL LOW (ref 22–32)
Calcium: 9.2 mg/dL (ref 8.9–10.3)
Chloride: 103 mmol/L (ref 98–111)
Creatinine, Ser: 11.59 mg/dL — ABNORMAL HIGH (ref 0.61–1.24)
GFR calc Af Amer: 5 mL/min — ABNORMAL LOW (ref >60)
GFR calc non Af Amer: 5 mL/min — ABNORMAL LOW (ref >60)
Glucose, Bld: 106 mg/dL — ABNORMAL HIGH (ref 70–99)
Phosphorus: 6.3 mg/dL — ABNORMAL HIGH (ref 2.5–4.6)
Potassium: 3.8 mmol/L (ref 3.5–5.1)
Sodium: 136 mmol/L (ref 135–145)

## 2018-09-08 LAB — GLUCOSE, CAPILLARY
Glucose-Capillary: 116 mg/dL — ABNORMAL HIGH (ref 70–99)
Glucose-Capillary: 113 mg/dL — ABNORMAL HIGH (ref 70–99)
Glucose-Capillary: 135 mg/dL — ABNORMAL HIGH (ref 70–99)
Glucose-Capillary: 76 mg/dL (ref 70–99)
Glucose-Capillary: 108 mg/dL — ABNORMAL HIGH (ref 70–99)

## 2018-09-08 LAB — CBC
HCT: 26.3 % — ABNORMAL LOW (ref 39.0–52.0)
Hemoglobin: 8.1 g/dL — ABNORMAL LOW (ref 13.0–17.0)
MCH: 29.6 pg (ref 26.0–34.0)
MCHC: 30.8 g/dL (ref 30.0–36.0)
MCV: 96 fL (ref 80.0–100.0)
Platelets: 212 10*3/uL (ref 150–400)
RBC: 2.74 MIL/uL — ABNORMAL LOW (ref 4.22–5.81)
RDW: 15.9 % — ABNORMAL HIGH (ref 11.5–15.5)
WBC: 6.3 10*3/uL (ref 4.0–10.5)
nRBC: 0.3 % — ABNORMAL HIGH (ref 0.0–0.2)

## 2018-09-08 LAB — PROTIME-INR
INR: 2.1 — ABNORMAL HIGH (ref 0.8–1.2)
Prothrombin Time: 23 seconds — ABNORMAL HIGH (ref 11.4–15.2)

## 2018-09-08 MED ORDER — PENTAFLUOROPROP-TETRAFLUOROETH EX AERO: 1.0000 "application " | INHALATION_SPRAY | CUTANEOUS | Status: DC | PRN

## 2018-09-08 MED ORDER — HEPARIN SODIUM (PORCINE) 1000 UNIT/ML DIALYSIS
1000.0000 [IU] | INTRAMUSCULAR | Status: DC | PRN
  Filled 2018-09-08 (×2): qty 1

## 2018-09-08 MED ORDER — LIDOCAINE HCL (PF) 1 % IJ SOLN
5.0000 mL | INTRAMUSCULAR | Status: DC | PRN
  Filled 2018-09-08: qty 5

## 2018-09-08 MED ORDER — DARBEPOETIN ALFA 60 MCG/0.3ML IJ SOSY
60.0000 ug | PREFILLED_SYRINGE | Freq: Once | INTRAMUSCULAR | Status: AC
  Administered 2018-09-08: 60 ug via INTRAVENOUS
  Filled 2018-09-08 (×2): qty 0.3

## 2018-09-08 MED ORDER — SODIUM CHLORIDE 0.9 % IV SOLN
100.0000 mL | INTRAVENOUS | Status: DC | PRN
  Administered 2018-09-10: 10:00:00 via INTRAVENOUS

## 2018-09-08 MED ORDER — ALTEPLASE 2 MG IJ SOLR
2.0000 mg | Freq: Once | INTRAMUSCULAR | Status: DC | PRN
  Filled 2018-09-08: qty 2

## 2018-09-08 MED ORDER — SODIUM CHLORIDE 0.9 % IV SOLN: 100.0000 mL | INTRAVENOUS | Status: DC | PRN

## 2018-09-08 MED ORDER — LIDOCAINE-PRILOCAINE 2.5-2.5 % EX CREA
1.0000 "application " | TOPICAL_CREAM | CUTANEOUS | Status: DC | PRN
  Filled 2018-09-08: qty 5

## 2018-09-08 MED ORDER — INSULIN GLARGINE 100 UNIT/ML ~~LOC~~ SOLN
8.0000 [IU] | Freq: Every day | SUBCUTANEOUS | Status: DC
  Administered 2018-09-08 – 2018-09-11 (×4): 8 [IU] via SUBCUTANEOUS
  Filled 2018-09-08 (×6): qty 0.08

## 2018-09-08 NOTE — Progress Notes (Signed)
[facility] Progress Note   Dialysis Orders: TTS [place] 4 hr EDW 94 3 K 2.25 Ca right IJ TDC and maturing left upper AVF Aranesp/week 60 6/23 - previously 40 this past month- was to increase to 100 next week Hectorol 1 heparin 4 K  Assessment/Plan: 1. Sepsis - Fever with WBC to 15.9 with ^ N from WBC 6/25 of 6.4 with normal diff- rather acute onset. WBC down to 6.3 near baseline  Negative COVID/CXR, recent infected ext hemorrhoid, CT showed numerous small bowel mesenteric and retroperitoneal nodes, unintentional weight loss 5 kg this month after several months of very stable weights worrisome. Trend T bili. 2.7 upon admission  With normal LFTs Antibiotics per primary 2. ESRD -  TTS - no acute need for HDSaturday per labs/volume - scheduled for today  due to high patient volume .and reduce time to 3 hr K 4.1  3. BPn/volume  - CXR NAD - EDW recently lower to 94 - BP has been on the low side for some time - trying to ascertain his exact meds; MTP recently increased - he tolerates low BP well. 4. Anemia  - most recent outpt hgb 8.6 -9.2 > 8.1  tsat 20% with ferritin 1906 in May - not on IV Fe - ESA has been very slow to increase- just got 60 after a month of 40/week Aranesp doses with steadily declining hgb (down from 10.9 5/21. High ferritin has obviated IV Fe - but once acute illness resolve should get a bolus - plan ^ give additional 60 Aranesp  6/28 in dialysis and ^ ESA to 100 this week -  5. Metabolic bone disease -  Not on VDRA - renvela recently increased to 3 ac 6. Nutrition - renal diet/vits/supplements 7. PAF on chronic coumadin - MTP increased this month to 25 bid due to afib with RVR during ED visit 6/10; has scheduled cardioversion for 7/01  - INR 2.1 8. DM -  Per primary   Myriam Jacobson, PA-C [facility] Beeper [phone] 09/08/2018,11:37 AM  LOS: 1 day   Subjective:   Denies any pain.   Objective Vitals:   09/08/18 0756 09/08/18 0923 09/08/18 1024  09/08/18 1101  BP: 106/69 112/78    Pulse:      Resp: (!) 23 19 (!) 29 (!) 29  Temp:  98.3 F (36.8 C)    TempSrc:  Oral    SpO2: 98% 98%     Physical Exam General: NAD supine in bed Heart: irreg irreg low 100s Lungs: grossly clear Abdomen: soft NT + BS Extremities: no LE edema Dialysis Access: right IJ TDC and left AVF maturing + bruit   Additional Objective Labs: Basic Metabolic Panel: Recent Labs  Lab 09/07/18 0819 09/08/18 0457  NA 138 136  K 4.1 3.8  CL 100 103  CO2 20* 19*  GLUCOSE 157* 106*  BUN 34* 43*  CREATININE 10.65* 11.59*  CALCIUM 9.4 9.2  PHOS  --  6.3*   Liver Function Tests: Recent Labs  Lab 09/07/18 0819 09/08/18 0457  AST 37  --   ALT 38  --   ALKPHOS 193*  --   BILITOT 2.7*  --   PROT 7.5  --   ALBUMIN 3.0* 2.6*   No results for input(s): LIPASE, AMYLASE in the last 168 hours. CBC: Recent Labs  Lab 09/07/18 0819 09/07/18 1343 09/08/18 0457  WBC 15.9*  --  6.3  NEUTROABS 14.9*  --   --   HGB  9.2* 8.5* 8.1*  HCT 30.7* 27.5* 26.3*  MCV 98.1  --  96.0  PLT 288  --  212   Blood Culture    Component Value Date/Time   SDES BLOOD RIGHT HAND 09/07/2018 0854   SPECREQUEST  09/07/2018 0854    BOTTLES DRAWN AEROBIC AND ANAEROBIC Blood Culture adequate volume   CULT  09/07/2018 0854    NO GROWTH < 24 HOURS Performed at [facility], [place] [address], [place], [place] [address]    REPTSTATUS PENDING 09/07/2018 [phone]    Cardiac Enzymes: No results for input(s): CKTOTAL, CKMB, CKMBINDEX, TROPONINI in the last 168 hours. CBG: Recent Labs  Lab 09/07/18 1703 09/07/18 2034 09/08/18 0759 09/08/18 0844  GLUCAP 102* 138* 116* 113*   Iron Studies: No results for input(s): IRON, TIBC, TRANSFERRIN, FERRITIN in the last 72 hours. Lab Results  Component Value Date   INR 2.1 (H) 09/08/2018   INR 4.0 (A) 09/05/2018   INR 5.1 (A) 08/30/2018   Studies/Results: Ct Abdomen Pelvis Wo Contrast  Result Date: 09/07/2018 CLINICAL DATA:   Sepsis, evaluate for intra-abdominal origin or abscess EXAM: CT ABDOMEN AND PELVIS WITHOUT CONTRAST TECHNIQUE: Multidetector CT imaging of the abdomen and pelvis was performed following the standard protocol without IV contrast. COMPARISON:  04/27/2018 FINDINGS: Lower chest: No acute abnormality.  Cardiomegaly. Hepatobiliary: Coarse contour of the liver. Gallstones in the dependent gallbladder. No gallbladder wall thickening, or biliary dilatation. Pancreas: Unremarkable. No pancreatic ductal dilatation or surrounding inflammatory changes. Spleen: Normal in size without significant abnormality. Adrenals/Urinary Tract: No change in a 1.8 cm soft tissue attenuation nodule of the left adrenal gland (series 3, image 27). Kidneys are normal, without renal calculi, solid lesion, or hydronephrosis. Bladder is unremarkable. Stomach/Bowel: Stomach is within normal limits. Appendix appears normal. There is extensive small mesenteric fat stranding and numerous enlarged mesenteric lymph nodes in the central abdomen (series 3, image 43). Vascular/Lymphatic: No significant vascular findings are present. Numerous enlarged retroperitoneal lymph nodes, increased in size compared to prior examination, measuring up to 1.9 x 1.2 cm (series 3, image 44). Reproductive: No mass or other significant abnormality. Other: No abdominal wall hernia or abnormality.  Trace ascites. Musculoskeletal: No acute or significant osseous findings. IMPRESSION: 1. Numerous enlarged small bowel mesenteric and retroperitoneal lymph nodes, new compared to prior examination. These may be reactive, for example secondary to nonspecific infectious or inflammatory enteritis, however malignancy such as lymphoma is a differential consideration. Recommend CT follow-up in 3 months to ensure stability or resolution. These may be characterized for metabolic activity by PET-CT if indicated by clinical concern for malignancy. 2.  Coarse contour of the liver, suggesting  cirrhosis. 3.  Gallstones. 4.  Trace, nonspecific ascites. Electronically Signed   By: Eddie Candle M.D.   On: 09/07/2018 12:02   Dg Chest Port 1 View  Result Date: 09/07/2018 CLINICAL DATA:  Sepsis, cough. EXAM: PORTABLE CHEST 1 VIEW COMPARISON:  Chest x-ray dated 08/20/2018. FINDINGS: Stable cardiomegaly. LEFT chest wall pacemaker/ICD apparatus appears stable in position. RIGHT-sided central catheter appears stable in position with tip overlying the RIGHT atrium. Lungs are clear. No pleural effusion or pneumothorax seen. Osseous structures about the chest are unremarkable. IMPRESSION: 1. No active disease. No evidence of pneumonia or pulmonary edema. 2. Stable cardiomegaly. Electronically Signed   By: Franki Cabot M.D.   On: 09/07/2018 08:58   Medications: . cefTRIAXone (ROCEPHIN)  IV 2 g (09/08/18 0810)  . metronidazole Stopped (09/08/18 0802)   . amiodarone  200  mg Oral Daily  . atorvastatin  40 mg Oral q1800  . Chlorhexidine Gluconate Cloth  6 each Topical Q0600  . [START ON 09/10/2018] darbepoetin (ARANESP) injection - DIALYSIS  100 mcg Intravenous Q Tue-HD  . darbepoetin (ARANESP) injection - DIALYSIS  60 mcg Intravenous Once in dialysis  . [START ON 09/10/2018] doxercalciferol  1 mcg Intravenous Q T,Th,Sa-HD  . doxercalciferol  1 mcg Intravenous Once in dialysis  . insulin aspart  0-9 Units Subcutaneous TID WC  . multivitamin  1 tablet Oral QHS  . sevelamer carbonate  2,400 mg Oral TID WC  . sodium chloride flush  3 mL Intravenous Q12H

## 2018-09-08 NOTE — Progress Notes (Signed)
PROGRESS NOTE  Travis Bryan LFY:[id number] DOB: December 12, 1970 DOA: 09/07/2018 PCP: [name], FNP  Brief History   Travis Bryan is a 48 y.o. male with medical history significant of HTN, HLD, ESRD on HD, A. fib, systolic CHF, s/p AICD, and gout; who presents with complaints of having shakes and a dry cough.  History is somewhat difficult to obtain from patient.  When he woke up this morning he reported generalized malaise, shortness of breath, and subjective fever.  Other associated symptoms include some upper abdominal discomfort.  Furthermore, when he had a bowel movement this morning he noted some bleeding. He was scheduled to have hemodialysis this morning, but this session due to his symptoms.  He was seen at an urgent care 12 days ago, for rectal abscess.  From review of records area noted to be draining on its own and suspected to possibly be an infected external hemorrhoid.  Patient was given clindamycin and discharged home.  Patient reports intermittent compliance with the clindamycin.    ED Course: Upon admission into the emergency department patient was noted to be febrile up to 102.2 F, pulse up to 146 and atrial fibrillation with RVR, respirations 12-35, blood pressure 86/60, and O2 saturations maintained on room air.  Labs revealed WBCs 15.9, hemoglobin 9.2, BUN 34, creatinine 10.65, lactic acid 4.4, INR 4.  Chest x-ray was otherwise noted to be clear.  Patient was given 2 L of normal saline IV fluids, metronidazole, and Rocephin.  Patient only received half of his dose of vancomycin due to complaints of itching.  In the ED patient had a subsequent bowel movement with bright red blood noted in the toilet bowl.  CT scan of the abdomen and abdomen revealed numerous  Consultants   Gastroenterology  Nephrology  Procedures   Dialysis today per nephrology  Antibiotics   Anti-infectives (From admission, onward)   Start     Dose/Rate Route Frequency Ordered Stop   09/08/18  0900  cefTRIAXone (ROCEPHIN) 2 g in sodium chloride 0.9 % 100 mL IVPB     2 g 200 mL/hr over 30 Minutes Intravenous Every 24 hours 09/07/18 1332     09/07/18 1500  metroNIDAZOLE (FLAGYL) IVPB 500 mg     500 mg 100 mL/hr over 60 Minutes Intravenous Every 8 hours 09/07/18 1332     09/07/18 1100  metroNIDAZOLE (FLAGYL) IVPB 500 mg     500 mg 100 mL/hr over 60 Minutes Intravenous  Once 09/07/18 1049 09/07/18 1338   09/07/18 0845  vancomycin (VANCOCIN) IVPB 1000 mg/200 mL premix     1,000 mg 200 mL/hr over 60 Minutes Intravenous  Once 09/07/18 0841 09/07/18 0950   09/07/18 0845  cefTRIAXone (ROCEPHIN) 2 g in sodium chloride 0.9 % 100 mL IVPB     2 g 200 mL/hr over 30 Minutes Intravenous  Once 09/07/18 0841 09/07/18 0950       Subjective  The patient is lying in bed. He is somewhat lethargic. He states that he has been having BM's all night. He has been flushing before they can be viewed by nursing.  Objective   Vitals:  Vitals:   09/08/18 1415 09/08/18 1430  BP: (!) 88/67 90/66  Pulse: 99 100  Resp: (!) 26 (!) 30  Temp:    SpO2:     Exam:  Constitutional:   The patient is somnolent, but arouseable. No acute distress. Respiratory:   No increased work of breathing.  No wheezes, rales, or rhonchi  No tactile fremitus  Cardiovascular:   Regular rate and rhythm  No murmurs ectopy or gallups  No lateral PMI. No thrills. Abdomen:   Abdomen is soft, non-tender, non-distended.  No hernias, masses,s or organomegaly  Hyperactive bowel sounds. Musculoskeletal:   No cyanosis, clubbing, or edema Skin:   No rashes, lesions, ulcers  palpation of skin: no induration or nodules Neurologic:   CN 2-12 intact  Sensation all 4 extremities intact Psychiatric:   Mental status o Mood, affect appropriate o Orientation to person, place, time   judgment and insight appear intact   I have personally reviewed the following:   Today's Data   Vitals, CBC,  BMP   Scheduled Meds:  amiodarone  200 mg Oral Daily   atorvastatin  40 mg Oral q1800   Chlorhexidine Gluconate Cloth  6 each Topical Q0600   [START ON 09/10/2018] darbepoetin (ARANESP) injection - DIALYSIS  100 mcg Intravenous Q Tue-HD   [START ON 09/10/2018] doxercalciferol  1 mcg Intravenous Q T,Th,Sa-HD   insulin aspart  0-9 Units Subcutaneous TID WC   multivitamin  1 tablet Oral QHS   sevelamer carbonate  2,400 mg Oral TID WC   sodium chloride flush  3 mL Intravenous Q12H   Continuous Infusions:  sodium chloride     sodium chloride     cefTRIAXone (ROCEPHIN)  IV 2 g (09/08/18 0810)   metronidazole Stopped (09/08/18 0802)    Principal Problem:   Severe sepsis (HCC) Active Problems:   Type 2 diabetes mellitus (HCC)   Automatic implantable cardioverter-defibrillator in situ   Persistent atrial fibrillation   Rectal bleed   Acute blood loss anemia   Hypotension   LOS: 1 day    A & P   Severe sepsis: Acute. Blood pressure remains on the low side. No further fevers or tachycardia.  This morning's labs revealed WBC of 6.3 down from 15.9 on admission. Lactic acid is down to 1.4 from an initial lactic acid 4.4.  Imaging studies revealed multiple enlarged small bowel mesenteric and retroperitoneal lymph nodes that are new concerning for infectious or inflammatory process. Empiric antibiotics of Rocephin and metronidazole have been continued. Vancomycin was stopped due to itching.  The patient was admitted to a progressive bed. Blood cultures were obtained and have had no growth. Tylenol is available for fever. He has been continued on Rocephin and metronidazole.  Rectal bleeding, question acute blood loss anemia: Acute.  Patient reports having rectal cyst that has been draining on its own.  On physical exam no visible abscess seen draining.  Hemoglobin 9.2 g/dL which appear similar previous 18 days ago.  Question infectious/inflammatory cause versus  internal hemorrhoid.  The patient has been typed and screened for possible need of blood products. H&H will be followed. GI is following and may take the patient for flexible sigmoidoscopy or colonoscopy should he continue to have evidence of bleeding.  Persistent atrial fibrillation, supratherapeutic INR: Patient initially noted to have heart rates into the 140s in atrial fibrillation, but appear to be improving with IV fluids.  INR noted to be supratherapeutic at 4. Continue amiodarone and follow INR.   Hypotension: Acute.  Blood pressures initially noted to be as low as 86/60.  Patient given 2 L of normal saline IV fluids. Metoprolol held. Will repeat bolus given continued hypotension.   ESRD on HD: Patient normally dialyzes Tuesday, Thursday, Saturday.  He missed his session today due to his symptoms.  Potassium 4.1, BUN 34, creatinine 10.64. Continue renvela. Nephrology's assistance is greatly appreciated.  Systolic congestive heart failure, status post AICD: Last EF noted to be 25 to 30% by echocardiogram in 02/2018. Strict intake and output.  Diabetes mellitus type 2: Last hemoglobin A1c relatively controlled on 08/28/2018 at 6.6. The patietn is on hypoglycemic protocols. Will restart lantus at 10 units daily. Home dose was 15 units daily. Glucoses will be followed by FSBS ans SSI.  I have seen and examined this patient myself. I have spent 35 minutes in his evaluation and care.  DVT prophylaxis: SCDs Code Status: Full Family Communication: No family present at bedside Disposition Plan: Likely discharge home in 2 to 3 days  Haruko Mersch, DO [facility] Direct contact: see [email or web address]  7PM-7AM contact night coverage as above  09/08/2018, 2:50 PM  LOS: 1 day

## 2018-09-08 NOTE — Progress Notes (Signed)
Pt refused SCD's. RN educated the patient on the risk and benefits of using SCD's, but pt continued to refuse the usage of VTE prevention . RN to continue to monitor.

## 2018-09-09 ENCOUNTER — Telehealth: Payer: Self-pay | Admitting: Cardiology

## 2018-09-09 LAB — GASTROINTESTINAL PANEL BY PCR, STOOL (REPLACES STOOL CULTURE)

## 2018-09-09 LAB — GLUCOSE, CAPILLARY
Glucose-Capillary: 102 mg/dL — ABNORMAL HIGH (ref 70–99)
Glucose-Capillary: 98 mg/dL (ref 70–99)
Glucose-Capillary: 103 mg/dL — ABNORMAL HIGH (ref 70–99)
Glucose-Capillary: 120 mg/dL — ABNORMAL HIGH (ref 70–99)
Glucose-Capillary: 99 mg/dL (ref 70–99)

## 2018-09-09 LAB — CBC WITH DIFFERENTIAL/PLATELET
Abs Immature Granulocytes: 0.02 10*3/uL (ref 0.00–0.07)
Basophils Absolute: 0 10*3/uL (ref 0.0–0.1)
Basophils Relative: 1 %
Eosinophils Absolute: 0.2 10*3/uL (ref 0.0–0.5)
Eosinophils Relative: 5 %
HCT: 25.1 % — ABNORMAL LOW (ref 39.0–52.0)
Hemoglobin: 7.9 g/dL — ABNORMAL LOW (ref 13.0–17.0)
Immature Granulocytes: 0 %
Lymphocytes Relative: 14 %
Lymphs Abs: 0.7 10*3/uL (ref 0.7–4.0)
MCH: 29.6 pg (ref 26.0–34.0)
MCHC: 31.5 g/dL (ref 30.0–36.0)
MCV: 94 fL (ref 80.0–100.0)
Monocytes Absolute: 0.6 10*3/uL (ref 0.1–1.0)
Monocytes Relative: 13 %
Neutro Abs: 3.3 10*3/uL (ref 1.7–7.7)
Neutrophils Relative %: 67 %
Platelets: 183 10*3/uL (ref 150–400)
RBC: 2.67 MIL/uL — ABNORMAL LOW (ref 4.22–5.81)
RDW: 16 % — ABNORMAL HIGH (ref 11.5–15.5)
WBC: 5 10*3/uL (ref 4.0–10.5)
nRBC: 0 % (ref 0.0–0.2)

## 2018-09-09 LAB — BASIC METABOLIC PANEL
Anion gap: 16 — ABNORMAL HIGH (ref 5–15)
BUN: 24 mg/dL — ABNORMAL HIGH (ref 6–20)
CO2: 23 mmol/L (ref 22–32)
Calcium: 9 mg/dL (ref 8.9–10.3)
Chloride: 98 mmol/L (ref 98–111)
Creatinine, Ser: 7.87 mg/dL — ABNORMAL HIGH (ref 0.61–1.24)
GFR calc Af Amer: 9 mL/min — ABNORMAL LOW (ref >60)
GFR calc non Af Amer: 7 mL/min — ABNORMAL LOW (ref >60)
Glucose, Bld: 111 mg/dL — ABNORMAL HIGH (ref 70–99)
Potassium: 3.5 mmol/L (ref 3.5–5.1)
Sodium: 137 mmol/L (ref 135–145)

## 2018-09-09 LAB — HEPATITIS B SURFACE ANTIGEN: Hepatitis B Surface Ag: NEGATIVE

## 2018-09-09 MED ORDER — PEG 3350-KCL-NA BICARB-NACL 420 G PO SOLR
4000.0000 mL | Freq: Once | ORAL | Status: AC
  Administered 2018-09-09: 4000 mL via ORAL
  Filled 2018-09-09: qty 4000

## 2018-09-09 MED ORDER — METOPROLOL TARTRATE 25 MG PO TABS: 25.0000 mg | ORAL_TABLET | Freq: Two times a day (BID) | ORAL | Status: DC

## 2018-09-09 MED ORDER — METOPROLOL TARTRATE 12.5 MG HALF TABLET
12.5000 mg | ORAL_TABLET | Freq: Two times a day (BID) | ORAL | Status: DC
  Administered 2018-09-09 – 2018-09-12 (×3): 12.5 mg via ORAL
  Filled 2018-09-09 (×5): qty 1

## 2018-09-09 NOTE — Telephone Encounter (Signed)
  Dr Benny Lennert, Hospitalist with [facility] is calling to make Dr Marlou Porch aware that the patient is in the hospital currently due to a rectal bleed and has been taken off his Coumadin. Mr Ng is scheduled to have an ablation on 09/11/18. This is day two of being off the Coumadin. Dr. Benny Lennert said that the patient will be having a colonoscopy on 09/10/18.

## 2018-09-09 NOTE — Progress Notes (Addendum)
PROGRESS NOTE  Travis Bryan OFB:[id number] DOB: 10-26-1970 DOA: 09/07/2018 PCP: [name], FNP  Brief History   Travis Bryan is a 48 y.o. male with medical history significant of HTN, HLD, ESRD on HD, A. fib, systolic CHF, s/p AICD, and gout; who presents with complaints of having shakes and a dry cough.  History is somewhat difficult to obtain from patient.  When he woke up this morning he reported generalized malaise, shortness of breath, and subjective fever.  Other associated symptoms include some upper abdominal discomfort.  Furthermore, when he had a bowel movement this morning he noted some bleeding. He was scheduled to have hemodialysis this morning, but this session due to his symptoms.  He was seen at an urgent care 12 days ago, for rectal abscess.  From review of records area noted to be draining on its own and suspected to possibly be an infected external hemorrhoid.  Patient was given clindamycin and discharged home.  Patient reports intermittent compliance with the clindamycin.    Upon admission into the emergency department patient was noted to be febrile up to 102.2 F, pulse up to 146 and atrial fibrillation with RVR, respirations 12-35, blood pressure 86/60, and O2 saturations maintained on room air.  Labs revealed WBCs 15.9, hemoglobin 9.2, BUN 34, creatinine 10.65, lactic acid 4.4, INR 4.  Chest x-ray was otherwise noted to be clear.  Patient was given 2 L of normal saline IV fluids, metronidazole, and Rocephin.  Patient only received half of his dose of vancomycin due to complaints of itching.  In the ED patient had a subsequent bowel movement with bright red blood noted in the toilet bowl.  CT scan of the abdomen and abdomen revealed numerous enlarged small bowel mesenteric and retroperitoneal lymph nodes that are new compared to prior exam. Possibly reactive, but lymphoma must be considered in the differential. PET scan should be considered. The liver contours are  consistent with cirrhosis. COVID 19 is negative.   The patient has been admitted to a medical bed. His coumadin was held due to his rectal bleeding. The patient had a cardioversion planned for Wednesday 09/11/2018, but this will need to be rescheduled as the patient has now been off of coumadin for 2 days. I left a message with Dr. Marlou Porch, the patient's cardiologist to advise him of this. The patient is very upset that his cardioversion will be delayed.  Consultants  . Gastroenterology . Nephrology  Procedures  . Dialysis today per nephrology  Antibiotics   Anti-infectives (From admission, onward)   Start     Dose/Rate Route Frequency Ordered Stop   09/08/18 0900  cefTRIAXone (ROCEPHIN) 2 g in sodium chloride 0.9 % 100 mL IVPB     2 g 200 mL/hr over 30 Minutes Intravenous Every 24 hours 09/07/18 1332     09/07/18 1500  metroNIDAZOLE (FLAGYL) IVPB 500 mg     500 mg 100 mL/hr over 60 Minutes Intravenous Every 8 hours 09/07/18 1332     09/07/18 1100  metroNIDAZOLE (FLAGYL) IVPB 500 mg     500 mg 100 mL/hr over 60 Minutes Intravenous  Once 09/07/18 1049 09/07/18 1338   09/07/18 0845  vancomycin (VANCOCIN) IVPB 1000 mg/200 mL premix     1,000 mg 200 mL/hr over 60 Minutes Intravenous  Once 09/07/18 0841 09/07/18 0950   09/07/18 0845  cefTRIAXone (ROCEPHIN) 2 g in sodium chloride 0.9 % 100 mL IVPB     2 g 200 mL/hr over 30 Minutes Intravenous  Once  09/07/18 0841 09/07/18 0950      Subjective  The patient is lying in bed. He is somewhat lethargic. He states that he has been having BM's all night. He has been flushing before they can be viewed by nursing.  Objective   Vitals:  Vitals:   09/09/18 1212 09/09/18 1444  BP: (!) 94/52 113/79  Pulse: (!) 113 94  Resp: (!) 23 20  Temp: 98.6 F (37 C)   SpO2: 100% 93%   Exam:  Constitutional:  . The patient is somnolent, but arouseable. No acute distress. Respiratory:  . No increased work of breathing. . No wheezes, rales, or  rhonchi . No tactile fremitus Cardiovascular:  . Irregular rate and rhythm, tachycardic . No murmurs ectopy or gallups . No lateral PMI. No thrills. Abdomen:  . Abdomen is soft, non-tender, non-distended. . No hernias, masses,s or organomegaly . Hyperactive bowel sounds. Musculoskeletal:  . No cyanosis, clubbing, or edema Skin:  . No rashes, lesions, ulcers . palpation of skin: no induration or nodules Neurologic:  . CN 2-12 intact . Sensation all 4 extremities intact Psychiatric:  . Mental status o Mood, affect appropriate o Orientation to person, place, time  . judgment and insight appear intact   I have personally reviewed the following:   Today's Data  . Vitals, CBC, BMP   Scheduled Meds: . amiodarone  200 mg Oral Daily  . atorvastatin  40 mg Oral q1800  . Chlorhexidine Gluconate Cloth  6 each Topical Q0600  . [START ON 09/10/2018] darbepoetin (ARANESP) injection - DIALYSIS  100 mcg Intravenous Q Tue-HD  . [START ON 09/10/2018] doxercalciferol  1 mcg Intravenous Q T,Th,Sa-HD  . insulin aspart  0-9 Units Subcutaneous TID WC  . insulin glargine  8 Units Subcutaneous QHS  . multivitamin  1 tablet Oral QHS  . sevelamer carbonate  2,400 mg Oral TID WC  . sodium chloride flush  3 mL Intravenous Q12H   Continuous Infusions: . sodium chloride    . sodium chloride    . cefTRIAXone (ROCEPHIN)  IV 2 g (09/09/18 1058)  . metronidazole 500 mg (09/09/18 1538)    Principal Problem:   Severe sepsis ([place]) Active Problems:   Type 2 diabetes mellitus (HCC)   Automatic implantable cardioverter-defibrillator in situ   Persistent atrial fibrillation   Rectal bleed   Acute blood loss anemia   Hypotension   LOS: 2 days    A & P   Severe sepsis: Acute. Blood pressure remains on the low side. No further fevers or tachycardia.  This morning's labs revealed WBC of 6.3 down from 15.9 on admission. Lactic acid is down to 1.4 from an initial lactic acid 4.4.  Imaging studies  revealed multiple enlarged small bowel mesenteric and retroperitoneal lymph nodes that are new concerning for infectious or inflammatory process. Empiric antibiotics of Rocephin and metronidazole have been continued. Vancomycin was stopped due to itching.  The patient was admitted to a progressive bed. Blood cultures were obtained and have had no growth. Tylenol is available for fever. He has been continued on Rocephin and metronidazole.  Rectal bleeding, question acute blood loss anemia: Acute.  Patient reports having rectal cyst that has been draining on its own.  On physical exam no visible abscess seen draining.  Hemoglobin 9.2 g/dL which appear similar previous 18 days ago.  Question infectious/inflammatory cause versus  internal hemorrhoid. The patient has been typed and screened for possible need of blood products. H&H will be followed. GI is following  and may take the patient for flexible sigmoidoscopy or colonoscopy should he continue to have evidence of bleeding.  Persistent atrial fibrillation, supratherapeutic INR: Patient initially noted to have heart rates into the 140s in atrial fibrillation, but appear to be improving with IV fluids.  INR noted to be supratherapeutic at 4. Continue amiodarone and follow INR. Coumadin has been held. I contacted the office of Dr. Marlou Porch and let him know that the patient has been off of coumadin since Saturday. Will restart metoprolol with parameters.  Hypotension: Acute. Blood pressures initially noted to be as low as 86/60.  Patient given 2 L of normal saline IV fluids. Metoprolol held. Will repeat bolus given continued hypotension.  Resolving.  ESRD on HD: Patient normally dialyzes Tuesday, Thursday, Saturday.  He missed his session today due to his symptoms.  Potassium 4.1, BUN 34, creatinine 10.64. Continue renvela. Nephrology's assistance is greatly appreciated.  Systolic congestive heart failure, status post AICD: Last EF noted to be 25 to 30% by  echocardiogram in 02/2018. Strict intake and output.  Diabetes mellitus type 2: Last hemoglobin A1c relatively controlled on 08/28/2018 at 6.6. The patietn is on hypoglycemic protocols. Will restart lantus at 10 units daily. Home dose was 15 units daily. Glucoses will be followed by FSBS ans SSI.  I have seen and examined this patient myself. I have spent 32 minutes in his evaluation and care.  DVT prophylaxis: SCDs Code Status: Full Family Communication: No family present at bedside Disposition Plan: Likely discharge home in 2 to 3 days  Travis Rossa, DO [facility] Direct contact: see [email or web address]  7PM-7AM contact night coverage as above  09/09/2018, 4:50 PM  LOS: 1 day

## 2018-09-09 NOTE — H&P (View-Only) (Signed)
[facility] Gastroenterology Progress Note  Travis Bryan 48 y.o. 1971-01-26  CC: Rectal bleeding, rectal pain   Subjective: He denies any further bleeding episodes.  Rectal pain has improved.  Planing of back pain.  Denies nausea vomiting.  ROS : Negative for chest pain and shortness of breath.  Objective: Vital signs in last 24 hours: Vitals:   09/09/18 0004 09/09/18 0513  BP: 101/83 (!) 121/93  Pulse:    Resp: (!) 25   Temp: 99.2 F (37.3 C) 98.7 F (37.1 C)  SpO2:      Physical Exam:  General:  Alert, cooperative, no distress, appears stated age  Head:  Normocephalic, without obvious abnormality, atraumatic  Eyes:  , EOM's intact,   Lungs:   Clear to auscultation bilaterally, respirations unlabored  Heart:   Irregularly irregular heart rate  Abdomen:   Soft, non-tender, nondistended, bowel sounds present, no peritoneal signs  Extremities: Extremities normal, atraumatic, no  edema       Lab Results: Recent Labs    09/08/18 0457 09/09/18 0403  NA 136 137  K 3.8 3.5  CL 103 98  CO2 19* 23  GLUCOSE 106* 111*  BUN 43* 24*  CREATININE 11.59* 7.87*  CALCIUM 9.2 9.0  PHOS 6.3*  --    Recent Labs    09/07/18 0819 09/08/18 0457  AST 37  --   ALT 38  --   ALKPHOS 193*  --   BILITOT 2.7*  --   PROT 7.5  --   ALBUMIN 3.0* 2.6*   Recent Labs    09/07/18 0819  09/08/18 0457 09/09/18 0403  WBC 15.9*  --  6.3 5.0  NEUTROABS 14.9*  --   --  3.3  HGB 9.2*   < > 8.1* 7.9*  HCT 30.7*   < > 26.3* 25.1*  MCV 98.1  --  96.0 94.0  PLT 288  --  212 183   < > = values in this interval not displayed.   Recent Labs    09/08/18 0457  LABPROT 23.0*  INR 2.1*      Assessment/Plan: -Rectal bleeding.  Most likely hemorrhoidal.   -Rectal pain with history of recent infected and  bleeding hemorrhoids. -Anemia.  Multifactorial. -History of A. fib.  On anticoagulation. -End Stage renal disease on hemodialysis. -Abnormal CT scan showing numerous mesenteric and  retroperitoneal lymph nodes.  Most likely reactive. -Cirrhosis of the liver based on recent CT scan.  Recommendations ------------------------- -Looks like patient is scheduled for cardioversion on July 1.  His anticoagulation is currently on hold. -Given his need for further cardiac work-up, we will  plan for colonoscopy tomorrow.  I will also add EGD to rule out esophageal varices given recent CT scan showing changes concerning for liver cirrhosis.  - D/W Primary team.   Risks (bleeding, infection, bowel perforation that could require surgery, sedation-related changes in cardiopulmonary systems), benefits (identification and possible treatment of source of symptoms, exclusion of certain causes of symptoms), and alternatives (watchful waiting, radiographic imaging studies, empiric medical treatment)  were explained to patient in detail and patient wishes to proceed.   Otis Brace MD, [place] 09/09/2018, 8:48 AM  Contact #  [phone]

## 2018-09-09 NOTE — Progress Notes (Signed)
[facility] Gastroenterology Progress Note  Travis Bryan 48 y.o. [birth year]  CC: Rectal bleeding, rectal pain   Subjective: He denies any further bleeding episodes.  Rectal pain has improved.  Planing of back pain.  Denies nausea vomiting.  ROS : Negative for chest pain and shortness of breath.  Objective: Vital signs in last 24 hours: Vitals:   09/09/18 0004 09/09/18 0513  BP: 101/83 (!) 121/93  Pulse:    Resp: (!) 25   Temp: 99.2 F (37.3 C) 98.7 F (37.1 C)  SpO2:      Physical Exam:  General:  Alert, cooperative, no distress, appears stated age  Head:  Normocephalic, without obvious abnormality, atraumatic  Eyes:  , EOM's intact,   Lungs:   Clear to auscultation bilaterally, respirations unlabored  Heart:   Irregularly irregular heart rate  Abdomen:   Soft, non-tender, nondistended, bowel sounds present, no peritoneal signs  Extremities: Extremities normal, atraumatic, no  edema       Lab Results: Recent Labs    09/08/18 0457 09/09/18 0403  NA 136 137  K 3.8 3.5  CL 103 98  CO2 19* 23  GLUCOSE 106* 111*  BUN 43* 24*  CREATININE 11.59* 7.87*  CALCIUM 9.2 9.0  PHOS 6.3*  --    Recent Labs    09/07/18 0819 09/08/18 0457  AST 37  --   ALT 38  --   ALKPHOS 193*  --   BILITOT 2.7*  --   PROT 7.5  --   ALBUMIN 3.0* 2.6*   Recent Labs    09/07/18 0819  09/08/18 0457 09/09/18 0403  WBC 15.9*  --  6.3 5.0  NEUTROABS 14.9*  --   --  3.3  HGB 9.2*   < > 8.1* 7.9*  HCT 30.7*   < > 26.3* 25.1*  MCV 98.1  --  96.0 94.0  PLT 288  --  212 183   < > = values in this interval not displayed.   Recent Labs    09/08/18 0457  LABPROT 23.0*  INR 2.1*      Assessment/Plan: -Rectal bleeding.  Most likely hemorrhoidal.   -Rectal pain with history of recent infected and  bleeding hemorrhoids. -Anemia.  Multifactorial. -History of A. fib.  On anticoagulation. -End Stage renal disease on hemodialysis. -Abnormal CT scan showing numerous mesenteric and  retroperitoneal lymph nodes.  Most likely reactive. -Cirrhosis of the liver based on recent CT scan.  Recommendations ------------------------- -Looks like patient is scheduled for cardioversion on July 1.  His anticoagulation is currently on hold. -Given his need for further cardiac work-up, we will  plan for colonoscopy tomorrow.  I will also add EGD to rule out esophageal varices given recent CT scan showing changes concerning for liver cirrhosis.  - D/W Primary team.   Risks (bleeding, infection, bowel perforation that could require surgery, sedation-related changes in cardiopulmonary systems), benefits (identification and possible treatment of source of symptoms, exclusion of certain causes of symptoms), and alternatives (watchful waiting, radiographic imaging studies, empiric medical treatment)  were explained to patient in detail and patient wishes to proceed.   Otis Brace MD, [place] 09/09/2018, 8:48 AM  Contact #  [phone]

## 2018-09-09 NOTE — Progress Notes (Addendum)
[facility] Progress Note   Subjective: No C/Os at present. AFIB with ventricular rate 114 at present.   Objective Vitals:   09/08/18 2044 09/09/18 0004 09/09/18 0433 09/09/18 0513  BP: 101/65 101/83  (!) 121/93  Pulse:      Resp: 17 (!) 25    Temp:  99.2 F (37.3 C)  98.7 F (37.1 C)  TempSrc:  Oral  Oral  SpO2:      Weight:   93.7 kg    Physical Exam General: Chronically ill appearing male in NAD Heart: regularly irreg rate 110-120s. Lungs: Bilateral breath sounds decreased in bases. Abdomen: Active BS Extremities: trace BLE edema Dialysis Access: RIJ TDC drsg intact. Maturing L AVF+ bruit   Additional Objective Labs: Basic Metabolic Panel: Recent Labs  Lab 09/07/18 0819 09/08/18 0457 09/09/18 0403  NA 138 136 137  K 4.1 3.8 3.5  CL 100 103 98  CO2 20* 19* 23  GLUCOSE 157* 106* 111*  BUN 34* 43* 24*  CREATININE 10.65* 11.59* 7.87*  CALCIUM 9.4 9.2 9.0  PHOS  --  6.3*  --    Liver Function Tests: Recent Labs  Lab 09/07/18 0819 09/08/18 0457  AST 37  --   ALT 38  --   ALKPHOS 193*  --   BILITOT 2.7*  --   PROT 7.5  --   ALBUMIN 3.0* 2.6*   No results for input(s): LIPASE, AMYLASE in the last 168 hours. CBC: Recent Labs  Lab 09/07/18 0819 09/07/18 1343 09/08/18 0457 09/09/18 0403  WBC 15.9*  --  6.3 5.0  NEUTROABS 14.9*  --   --  3.3  HGB 9.2* 8.5* 8.1* 7.9*  HCT 30.7* 27.5* 26.3* 25.1*  MCV 98.1  --  96.0 94.0  PLT 288  --  212 183   Blood Culture    Component Value Date/Time   SDES BLOOD RIGHT HAND 09/07/2018 0854   SPECREQUEST  09/07/2018 0854    BOTTLES DRAWN AEROBIC AND ANAEROBIC Blood Culture adequate volume   CULT  09/07/2018 0854    NO GROWTH 2 DAYS Performed at [facility], [place] [address] [place], [place] [address]    REPTSTATUS PENDING 09/07/2018 [phone]    Cardiac Enzymes: No results for input(s): CKTOTAL, CKMB, CKMBINDEX, TROPONINI in the last 168 hours. CBG: Recent Labs  Lab 09/08/18 0844  09/08/18 1147 09/08/18 1603 09/08/18 2134 09/09/18 0816  GLUCAP 113* 135* 76 108* 102*   Iron Studies: No results for input(s): IRON, TIBC, TRANSFERRIN, FERRITIN in the last 72 hours. @lablastinr3 @ Studies/Results: Ct Abdomen Pelvis Wo Contrast  Result Date: 09/07/2018 CLINICAL DATA:  Sepsis, evaluate for intra-abdominal origin or abscess EXAM: CT ABDOMEN AND PELVIS WITHOUT CONTRAST TECHNIQUE: Multidetector CT imaging of the abdomen and pelvis was performed following the standard protocol without IV contrast. COMPARISON:  04/27/2018 FINDINGS: Lower chest: No acute abnormality.  Cardiomegaly. Hepatobiliary: Coarse contour of the liver. Gallstones in the dependent gallbladder. No gallbladder wall thickening, or biliary dilatation. Pancreas: Unremarkable. No pancreatic ductal dilatation or surrounding inflammatory changes. Spleen: Normal in size without significant abnormality. Adrenals/Urinary Tract: No change in a 1.8 cm soft tissue attenuation nodule of the left adrenal gland (series 3, image 27). Kidneys are normal, without renal calculi, solid lesion, or hydronephrosis. Bladder is unremarkable. Stomach/Bowel: Stomach is within normal limits. Appendix appears normal. There is extensive small mesenteric fat stranding and numerous enlarged mesenteric lymph nodes in the central abdomen (series 3, image 43). Vascular/Lymphatic: No significant vascular findings are present.  Numerous enlarged retroperitoneal lymph nodes, increased in size compared to prior examination, measuring up to 1.9 x 1.2 cm (series 3, image 44). Reproductive: No mass or other significant abnormality. Other: No abdominal wall hernia or abnormality.  Trace ascites. Musculoskeletal: No acute or significant osseous findings. IMPRESSION: 1. Numerous enlarged small bowel mesenteric and retroperitoneal lymph nodes, new compared to prior examination. These may be reactive, for example secondary to nonspecific infectious or inflammatory  enteritis, however malignancy such as lymphoma is a differential consideration. Recommend CT follow-up in 3 months to ensure stability or resolution. These may be characterized for metabolic activity by PET-CT if indicated by clinical concern for malignancy. 2.  Coarse contour of the liver, suggesting cirrhosis. 3.  Gallstones. 4.  Trace, nonspecific ascites. Electronically Signed   By: Eddie Candle M.D.   On: 09/07/2018 12:02   Medications: . sodium chloride    . sodium chloride    . cefTRIAXone (ROCEPHIN)  IV 2 g (09/09/18 1058)  . metronidazole 500 mg (09/09/18 0903)   . amiodarone  200 mg Oral Daily  . atorvastatin  40 mg Oral q1800  . Chlorhexidine Gluconate Cloth  6 each Topical Q0600  . [START ON 09/10/2018] darbepoetin (ARANESP) injection - DIALYSIS  100 mcg Intravenous Q Tue-HD  . [START ON 09/10/2018] doxercalciferol  1 mcg Intravenous Q T,Th,Sa-HD  . insulin aspart  0-9 Units Subcutaneous TID WC  . insulin glargine  8 Units Subcutaneous QHS  . multivitamin  1 tablet Oral QHS  . polyethylene glycol-electrolytes  4,000 mL Oral Once  . sevelamer carbonate  2,400 mg Oral TID WC  . sodium chloride flush  3 mL Intravenous Q12H     Dialysis Orders: TTS [facility] 4 hr EDW 94 kg 3 K 2.25 Ca right IJ TDC and maturing left upper AVF Aranesp/week 60 6/23 - previously 40 this past month- was to increase to 100 next weekHectorol 1 mcg IV TIW heparin 4000 units IV TIW  Assessment/Plan: 1. Sepsis -Fever withWBC to 15.9 with ^ N from WBC 6/25 of 6.4 with normal diff- rather acute onset. WBC down to 6.3 near baseline  NegativeCOVID/CXR, recent infected ext hemorrhoid, CT showed numerous small bowel mesenteric and retroperitoneal nodes, unintentional weight loss 5 kg this month after several months of very stable weights worrisome. Trend T bili. 2.7 upon admission  With normal LFTsAntibiotics per primary 2. ESRD- TTS -HD tomorrow on schedule. Use 4.0 K bath for K+ 3.5.  3. BPn/volume- CXR NAD  - EDW recently lower to 94 - BP has been on the low side for some time - trying to ascertain his exact meds; MTP recently increased - he tolerates low BP well. Currently slightly under EDW. Trace edema. UFG 0.5-1 liter with HD tomorrow.  4. Anemia- most recent outpt hgb 8.6 -9.2 > 8.1 tsat 20% with ferritin 1906 in May - not on IV Fe - ESA has been very slow to increase- just got 60 after a month of 40/week Aranesp doses with steadily declining hgb (down from 10.9 5/21. High ferritin has obviated IV Fe - but once acute illness resolve should get a bolus - plan ^ give additional 60 Aranesp  6/28 in dialysis and ^ ESA to 100 thisweek -  5. Metabolic bone disease- Not on VDRA - renvela recently increased to 3 ac 6. Nutrition- renal diet/vits/supplements 7. PAF on chronic coumadin -MTP increased this month to 25 bid due to afib with RVR during ED visit 6/10; has scheduled cardioversion for 7/01 -  INR 2.1 8. DM -Per primary  Rita H. Brown NP-C 09/09/2018, 11:43 AM  [employer] [phone]

## 2018-09-09 NOTE — Telephone Encounter (Signed)
I will route to Dr.Skains per Dr. Benny Lennert is wanting to inform Dr. Marlou Porch pt in the hospital. Dr. Marlou Porch is set up for upcoming procedure for the pt 09/11/18. I am also going to route to Roderic Palau, NP and Dr. Lovena Le as Juluis Rainier.

## 2018-09-09 NOTE — Progress Notes (Signed)
Spoke with patient's wife, Inez Catalina and updated her on the plan of care and patient's status.

## 2018-09-09 NOTE — Progress Notes (Signed)
Renal Navigator notified OP HD clinic/[facility] of patient's admission and negative COVID 19 test result to provide continuity of care.   Alphonzo Cruise, [place] Renal Navigator [phone]

## 2018-09-09 NOTE — TOC Initial Note (Signed)
Transition of Care [facility]) - Initial/Assessment Note    Patient Details  Name: Travis Bryan MRN: [id number] Date of Birth: 07/11/1970  Transition of Care [facility]) CM/SW Contact:    Pollie Friar, RN Phone Number: 09/09/2018, 4:09 PM  Clinical Narrative:                 Pt denies medication or transportation issues. Pt states he will not need anything at d/c.  TOC following.  Expected Discharge Plan: Home/Self Care Barriers to Discharge: Continued Medical Work up   Patient Goals and CMS Choice        Expected Discharge Plan and Services Expected Discharge Plan: Home/Self Care       Living arrangements for the past 2 months: Single Family Home                                      Prior Living Arrangements/Services Living arrangements for the past 2 months: Single Family Home Lives with:: Relatives(aunt and uncle) Patient language and need for interpreter reviewed:: Yes(no needs) Do you feel safe going back to the place where you live?: Yes            Criminal Activity/Legal Involvement Pertinent to Current Situation/Hospitalization: No - Comment as needed  Activities of Daily Living      Permission Sought/Granted                  Emotional Assessment Appearance:: Appears stated age Attitude/Demeanor/Rapport: Hostile Affect (typically observed): Agitated, Irritable Orientation: : Oriented to Self, Oriented to Place, Oriented to  Time, Oriented to Situation   Psych Involvement: No (comment)  Admission diagnosis:  Sepsis ([place]) [A41.9] Patient Active Problem List   Diagnosis Date Noted  . Severe sepsis ([place]) 09/07/2018  . Rectal bleed 09/07/2018  . Acute blood loss anemia 09/07/2018  . Hypotension 09/07/2018  . Acute renal failure superimposed on stage 4 chronic kidney disease ([place]) 05/01/2018  . Debility 04/26/2018  . Hypoxia   . Post-operative state   . Anemia of chronic disease   . PAF (paroxysmal atrial fibrillation) ([place])   . Pressure  injury of skin 04/17/2018  . Bilateral pneumonia 03/29/2018  . Acute respiratory failure with hypoxia ([place]) 03/28/2018  . DKA (diabetic ketoacidosis) ([place]) 02/25/2018  . Acute on chronic systolic heart failure, NYHA class 4 ([place]) 02/25/2018  . Hyperlipidemia 02/25/2018  . Atrial fibrillation ([place]) 02/25/2018  . CAD (coronary artery disease) 02/25/2018  . Elevated troponin 02/25/2018  . Sepsis due to pneumonia ([place]) 02/25/2018  . AKI (acute kidney injury) ([place]) 02/25/2018  . Type 2 diabetes mellitus with complication, with long-term current use of insulin ([place]) 12/26/2017  . At risk for sexually transmitted disease due to unprotected sex 08/23/2017  . Healthcare maintenance 06/24/2016  . Persistent atrial fibrillation   . CARDIOMYOPATHY, SECONDARY 07/03/2008  . HFrEF (heart failure with reduced ejection fraction) ([place]) 07/03/2008  . Automatic implantable cardioverter-defibrillator in situ 07/03/2008  . Type 2 diabetes mellitus ([place]) 12/26/2005  . Hyperlipidemia associated with type 2 diabetes mellitus ([place]) 12/26/2005  . Hypertension associated with diabetes ([place]) 12/26/2005   PCP:  [name], FNP Pharmacy:   [facility], [address] [place] [address] Phone: [phone] Fax: [place] ([place]), [place] [address] [place] ([place]) [place] [address]  Phone: [phone] Fax: [place], [place]. [address] [place] [address] Phone: [phone] Fax: [phone]     Social Determinants of Health (SDOH) Interventions    Readmission Risk Interventions No flowsheet data found.

## 2018-09-10 ENCOUNTER — Encounter (HOSPITAL_COMMUNITY): Payer: Self-pay | Admitting: *Deleted

## 2018-09-10 ENCOUNTER — Inpatient Hospital Stay (HOSPITAL_COMMUNITY): Admission: RE | Admit: 2018-09-10 | Payer: Medicaid Other | Source: Ambulatory Visit

## 2018-09-10 ENCOUNTER — Encounter (HOSPITAL_COMMUNITY)
Admission: EM | Disposition: A | Discharge status: Home / Self Care | Payer: Self-pay | Source: Home / Self Care | Attending: Internal Medicine

## 2018-09-10 ENCOUNTER — Inpatient Hospital Stay (HOSPITAL_COMMUNITY): Payer: Medicare Other | Admitting: Certified Registered"

## 2018-09-10 HISTORY — PX: ESOPHAGOGASTRODUODENOSCOPY (EGD) WITH PROPOFOL: SHX5813

## 2018-09-10 HISTORY — PX: POLYPECTOMY: SHX5525

## 2018-09-10 HISTORY — PX: COLONOSCOPY WITH PROPOFOL: SHX5780

## 2018-09-10 LAB — BASIC METABOLIC PANEL
Anion gap: 14 (ref 5–15)
BUN: 31 mg/dL — ABNORMAL HIGH (ref 6–20)
CO2: 24 mmol/L (ref 22–32)
Calcium: 9.2 mg/dL (ref 8.9–10.3)
Chloride: 101 mmol/L (ref 98–111)
Creatinine, Ser: 10.28 mg/dL — ABNORMAL HIGH (ref 0.61–1.24)
GFR calc Af Amer: 6 mL/min — ABNORMAL LOW (ref >60)
GFR calc non Af Amer: 5 mL/min — ABNORMAL LOW (ref >60)
Glucose, Bld: 87 mg/dL (ref 70–99)
Potassium: 3.3 mmol/L — ABNORMAL LOW (ref 3.5–5.1)
Sodium: 139 mmol/L (ref 135–145)

## 2018-09-10 LAB — CBC
HCT: 28.5 % — ABNORMAL LOW (ref 39.0–52.0)
Hemoglobin: 8.8 g/dL — ABNORMAL LOW (ref 13.0–17.0)
MCH: 29.2 pg (ref 26.0–34.0)
MCHC: 30.9 g/dL (ref 30.0–36.0)
MCV: 94.7 fL (ref 80.0–100.0)
Platelets: 198 10*3/uL (ref 150–400)
RBC: 3.01 MIL/uL — ABNORMAL LOW (ref 4.22–5.81)
RDW: 15.9 % — ABNORMAL HIGH (ref 11.5–15.5)
WBC: 4.8 10*3/uL (ref 4.0–10.5)
nRBC: 0.4 % — ABNORMAL HIGH (ref 0.0–0.2)

## 2018-09-10 LAB — CBC WITH DIFFERENTIAL/PLATELET
Abs Immature Granulocytes: 0.02 10*3/uL (ref 0.00–0.07)
Basophils Absolute: 0 10*3/uL (ref 0.0–0.1)
Basophils Relative: 0 %
Eosinophils Absolute: 0.3 10*3/uL (ref 0.0–0.5)
Eosinophils Relative: 7 %
HCT: 26.4 % — ABNORMAL LOW (ref 39.0–52.0)
Hemoglobin: 8.2 g/dL — ABNORMAL LOW (ref 13.0–17.0)
Immature Granulocytes: 0 %
Lymphocytes Relative: 17 %
Lymphs Abs: 0.8 10*3/uL (ref 0.7–4.0)
MCH: 29.2 pg (ref 26.0–34.0)
MCHC: 31.1 g/dL (ref 30.0–36.0)
MCV: 94 fL (ref 80.0–100.0)
Monocytes Absolute: 0.6 10*3/uL (ref 0.1–1.0)
Monocytes Relative: 13 %
Neutro Abs: 2.9 10*3/uL (ref 1.7–7.7)
Neutrophils Relative %: 63 %
Platelets: 179 10*3/uL (ref 150–400)
RBC: 2.81 MIL/uL — ABNORMAL LOW (ref 4.22–5.81)
RDW: 15.9 % — ABNORMAL HIGH (ref 11.5–15.5)
WBC: 4.7 10*3/uL (ref 4.0–10.5)
nRBC: 0 % (ref 0.0–0.2)

## 2018-09-10 LAB — GLUCOSE, CAPILLARY
Glucose-Capillary: 67 mg/dL — ABNORMAL LOW (ref 70–99)
Glucose-Capillary: 95 mg/dL (ref 70–99)
Glucose-Capillary: 104 mg/dL — ABNORMAL HIGH (ref 70–99)
Glucose-Capillary: 122 mg/dL — ABNORMAL HIGH (ref 70–99)

## 2018-09-10 SURGERY — ESOPHAGOGASTRODUODENOSCOPY (EGD) WITH PROPOFOL
Anesthesia: Monitor Anesthesia Care

## 2018-09-10 SURGERY — COLONOSCOPY WITH PROPOFOL
Anesthesia: Monitor Anesthesia Care

## 2018-09-10 MED ORDER — SODIUM CHLORIDE 0.9 % IV SOLN: 100.0000 mL | INTRAVENOUS | Status: DC | PRN

## 2018-09-10 MED ORDER — LIDOCAINE 2% (20 MG/ML) 5 ML SYRINGE
INTRAMUSCULAR | Status: DC | PRN
  Administered 2018-09-10: 100 mg via INTRAVENOUS

## 2018-09-10 MED ORDER — DEXTROSE 50 % IV SOLN
INTRAVENOUS | Status: AC
  Filled 2018-09-10: qty 50

## 2018-09-10 MED ORDER — WARFARIN SODIUM 5 MG PO TABS
5.0000 mg | ORAL_TABLET | Freq: Once | ORAL | Status: AC
  Administered 2018-09-10: 5 mg via ORAL
  Filled 2018-09-10: qty 1

## 2018-09-10 MED ORDER — SODIUM CHLORIDE 0.9 % IV SOLN: INTRAVENOUS | Status: DC

## 2018-09-10 MED ORDER — PROPOFOL 10 MG/ML IV BOLUS
INTRAVENOUS | Status: DC | PRN
  Administered 2018-09-10: 40 mg via INTRAVENOUS

## 2018-09-10 MED ORDER — ESMOLOL HCL 100 MG/10ML IV SOLN
INTRAVENOUS | Status: DC | PRN
  Administered 2018-09-10: 30 mg via INTRAVENOUS

## 2018-09-10 MED ORDER — DEXTROSE 50 % IV SOLN
12.5000 g | INTRAVENOUS | Status: AC
  Administered 2018-09-10: 12.5 g via INTRAVENOUS

## 2018-09-10 MED ORDER — ALBUMIN HUMAN 5 % IV SOLN
INTRAVENOUS | Status: DC | PRN
  Administered 2018-09-10: 10:00:00 via INTRAVENOUS

## 2018-09-10 MED ORDER — WARFARIN - PHARMACIST DOSING INPATIENT
Freq: Every day | Status: DC
  Administered 2018-09-10: 18:00:00

## 2018-09-10 MED ORDER — PHENYLEPHRINE 40 MCG/ML (10ML) SYRINGE FOR IV PUSH (FOR BLOOD PRESSURE SUPPORT)
PREFILLED_SYRINGE | INTRAVENOUS | Status: DC | PRN
  Administered 2018-09-10 (×2): 160 ug via INTRAVENOUS
  Administered 2018-09-10: 200 ug via INTRAVENOUS
  Administered 2018-09-10: 80 ug via INTRAVENOUS
  Administered 2018-09-10: 200 ug via INTRAVENOUS
  Administered 2018-09-10: 120 ug via INTRAVENOUS

## 2018-09-10 MED ORDER — HEPARIN SODIUM (PORCINE) 1000 UNIT/ML DIALYSIS: 4000.0000 [IU] | Freq: Once | INTRAMUSCULAR | Status: DC

## 2018-09-10 MED ORDER — PROPOFOL 500 MG/50ML IV EMUL
INTRAVENOUS | Status: DC | PRN
  Administered 2018-09-10: 150 ug/kg/min via INTRAVENOUS

## 2018-09-10 MED ORDER — HYDROCORTISONE ACETATE 25 MG RE SUPP
25.0000 mg | Freq: Two times a day (BID) | RECTAL | Status: DC
  Filled 2018-09-10 (×2): qty 1

## 2018-09-10 SURGICAL SUPPLY — 25 items

## 2018-09-10 NOTE — Progress Notes (Signed)
Hypoglycemic Event  CBG: 67  Treatment: D50 25 mL (12.5 gm)  Symptoms: None  Follow-up CBG: Time:0918 CBG Result:95  Possible Reasons for Event: Inadequate meal intake  Comments/MD notified:    Kirke Shaggy

## 2018-09-10 NOTE — Progress Notes (Addendum)
ANTICOAGULATION CONSULT NOTE - Initial Consult  Pharmacy Consult : Resume Warfarin  Indication: atrial fibrillation  Allergies  Allergen Reactions  . No Known Allergies     Patient Measurements: Weight: 206 lb 9.6 oz (93.7 kg) Height: 72 inches  Vital Signs: Temp: 98 F (36.7 C) (06/30 1032) Temp Source: Oral (06/30 0826) BP: 91/68 (06/30 1052) Pulse Rate: 98 (06/30 1052)  Labs: Recent Labs    09/08/18 0457 09/09/18 0403 09/10/18 0419 09/10/18 1159  HGB 8.1* 7.9* 8.2* 8.8*  HCT 26.3* 25.1* 26.4* 28.5*  PLT 212 183 179 198  LABPROT 23.0*  --   --   --   INR 2.1*  --   --   --   CREATININE 11.59* 7.87* 10.28*  --     Estimated Creatinine Clearance: 10.6 mL/min (A) (by C-G formula based on SCr of 10.28 mg/dL (H)).   Medical History: Past Medical History:  Diagnosis Date  . AICD (automatic cardioverter/defibrillator) present   . Atrial flutter ([place])    a. s/p ablation 03/2016  . Chronic kidney disease   . Chronic systolic CHF (congestive heart failure) ([place])   . HCAP (healthcare-associated pneumonia) 03/28/2018  . History of gout   . Hyperlipidemia   . Hypertension   . Myocardial infarction [facility])    "I think I had one a long long time ago" (03/28/2016)  . NICM (nonischemic cardiomyopathy) ([place])    a. LHC 6/06: pLAD 20, pLCx 20-30; b. Echo 5/15:  EF 15%, diffuse HK, restrictive physiology, trivial AI, trivial MR, mild LAE, moderate RVE, moderately reduced RVSF, moderate RAE, mild to moderate TR, PASP 43 mmHg  . Obesity   . Persistent atrial fibrillation   . Stroke ([place])   . Type II diabetes mellitus ([facility])     Medications:  Medications Prior to Admission  Medication Sig Dispense Refill Last Dose  . acetaminophen (TYLENOL) 500 MG tablet Take 1,000 mg by mouth every 6 (six) hours as needed for mild pain or headache.   unk  . amiodarone (PACERONE) 200 MG tablet Take 1 tablet (200 mg total) by mouth daily. 30 tablet 0 09/06/2018  . atorvastatin (LIPITOR) 40 MG  tablet Take 1 tablet (40 mg total) by mouth daily at 6 PM. 90 tablet 2 09/06/2018  . insulin glargine (LANTUS) 100 UNIT/ML injection Inject 0.15 mLs (15 Units total) into the skin at bedtime as needed. 10 mL 5 unk  . metoprolol tartrate (LOPRESSOR) 25 MG tablet Take 1 tablet (25 mg total) by mouth 2 (two) times daily. 60 tablet 0 09/06/2018  . multivitamin (RENA-VIT) TABS tablet TAKE 1 TABLET BY MOUTH AT BEDTIME. 30 tablet 6 09/06/2018  . sevelamer carbonate (RENVELA) 800 MG tablet Take 1 tablet (800 mg total) by mouth 3 (three) times daily with meals. 90 tablet 3 09/06/2018  . torsemide (DEMADEX) 20 MG tablet Take 60 mg by mouth daily.   unk  . warfarin (COUMADIN) 5 MG tablet Take As Directed by Coumadin Clinic (Patient taking differently: Take 5-7.5 mg by mouth See admin instructions. Take As Directed by Coumadin Clinic 5 mg (5 mg x 1) every Tue, Thu, Sat; 7.5 mg (5 mg x 1.5) all other days) 45 tablet 3 09/06/2018  . insulin aspart (NOVOLOG) 100 UNIT/ML injection Use 3 units three times a day with meals (Patient taking differently: Inject 0-3 Units into the skin 3 (three) times daily with meals. Use 3 units three times a day with meals per sliding scale.) 10 mL 3 08/24/2018  .  Insulin Syringe-Needle U-100 31G X 15/64" 0.3 ML MISC Use to inject insulin 4 times a day 200 each 12     Assessment: 48 y.o male with h/o ESRD-HD TTSat and h/o Afib, on warfarin PTA, admitted on 6/27 for severe sepsis and rectal bleeding. PTA INR 4.0 on 6/25.  He received Vitamin K 5 mg IVPB x 1 on 6/27 >> INR 2.1 on 6/28 post vitamin K.  No INR since 6/27 Warfarin held held here due to rectal bleeding>>Resume Warfarin 6/30  Pta warf dose:  7.5mg  daily except 5mg  qTTSat, LD pta on 6/26 , INR 4.0 on 6/24 and INR 2.1 on 6/28, admitted 6/27  On amiodarone pta, continued on admission for Atrial fibrillation  Hgb 8.1>7.9>8.8, pltc wnl stable Today 09/10/18 s/p EGD : normal,  no active bleeding. S/p colonoscopy 6/30  showed scattered  areas of friable mucosa and large internal hemorrhoids.  Small transverse colon polyp removed with biopsy forcep.  No active bleeding. GI okay'd to resume warfarin.  Patient on Flagyl , new this admit, which can increase warfarin effect. Watch for drug-drug interaction.    Goal of Therapy:  INR 2-3 Monitor platelets by anticoagulation protocol: Yes   Plan:  Resume Warfarin 5 mg today x1  (on Flagyl new, watch for DDI) Daily INR, CBC  Thank you for allowing pharmacy to be part of this patients care team.  Nicole Cella, [place] Pharmacist [phone] Please check AMION for all [place] phone numbers After 10:00 PM, call [place] [phone] 09/10/2018,2:42 PM

## 2018-09-10 NOTE — Progress Notes (Signed)
Pt oxygen continued to drop while he was sleeping. Pt was placed on 2L of oxygen. Will continue to monitor.

## 2018-09-10 NOTE — Transfer of Care (Signed)
Immediate Anesthesia Transfer of Care Note  Patient: Travis Bryan  Procedure(s) Performed: ESOPHAGOGASTRODUODENOSCOPY (EGD) WITH PROPOFOL (N/A ) COLONOSCOPY WITH PROPOFOL (N/A ) POLYPECTOMY  Patient Location: Endoscopy Unit  Anesthesia Type:MAC  Level of Consciousness: drowsy  Airway & Oxygen Therapy: Patient Spontanous Breathing and Patient connected to nasal cannula oxygen  Post-op Assessment: Report given to RN and Post -op Vital signs reviewed and stable  Post vital signs: Reviewed and stable  Last Vitals:  Vitals Value Taken Time  BP    Temp    Pulse    Resp    SpO2      Last Pain:  Vitals:   09/10/18 0826  TempSrc: Oral  PainSc: 0-No pain         Complications: No apparent anesthesia complications

## 2018-09-10 NOTE — Progress Notes (Addendum)
I have seen and examined this patient and agree with the plan of care .  Travis Bryan 09/11/2018, 12:03 PM  [facility] Progress Note   Subjective: Sitting up at bedside, no C/Os. Worried about not getting cardioversion. Coumadin held D/T supratherapeutic INR, resuming today.      Objective Vitals:   09/10/18 0826 09/10/18 1032 09/10/18 1042 09/10/18 1052  BP: 99/75 (!) 129/33 103/71 91/68  Pulse: (!) 107 (!) 105 (!) 104 98  Resp: (!) 21 (!) 43 (!) 25 11  Temp: 98.7 F (37.1 C) 98 F (36.7 C)    TempSrc: Oral     SpO2: 94% 91% 98% 97%  Weight:       Physical Exam General: Chronically ill appearing male in NAD Heart: regularly irreg rate 110-120s. Lungs: Bilateral breath sounds decreased in bases. Abdomen: Active BS Extremities: trace BLE edema Dialysis Access: RIJ TDC drsg intact. Maturing L AVF+ bruit   Additional Objective Labs: Basic Metabolic Panel: Recent Labs  Lab 09/08/18 0457 09/09/18 0403 09/10/18 0419  NA 136 137 139  K 3.8 3.5 3.3*  CL 103 98 101  CO2 19* 23 24  GLUCOSE 106* 111* 87  BUN 43* 24* 31*  CREATININE 11.59* 7.87* 10.28*  CALCIUM 9.2 9.0 9.2  PHOS 6.3*  --   --    Liver Function Tests: Recent Labs  Lab 09/07/18 0819 09/08/18 0457  AST 37  --   ALT 38  --   ALKPHOS 193*  --   BILITOT 2.7*  --   PROT 7.5  --   ALBUMIN 3.0* 2.6*   No results for input(s): LIPASE, AMYLASE in the last 168 hours. CBC: Recent Labs  Lab 09/07/18 0819  09/08/18 0457 09/09/18 0403 09/10/18 0419 09/10/18 1159  WBC 15.9*  --  6.3 5.0 4.7 4.8  NEUTROABS 14.9*  --   --  3.3 2.9  --   HGB 9.2*   < > 8.1* 7.9* 8.2* 8.8*  HCT 30.7*   < > 26.3* 25.1* 26.4* 28.5*  MCV 98.1  --  96.0 94.0 94.0 94.7  PLT 288  --  212 183 179 198   < > = values in this interval not displayed.   Blood Culture    Component Value Date/Time   SDES BLOOD RIGHT HAND 09/07/2018 0854   SPECREQUEST  09/07/2018 0854    BOTTLES DRAWN AEROBIC AND ANAEROBIC Blood  Culture adequate volume   CULT  09/07/2018 0854    NO GROWTH 3 DAYS Performed at [facility], [place] [address] [place], [place] [address]    REPTSTATUS PENDING 09/07/2018 [phone]    Cardiac Enzymes: No results for input(s): CKTOTAL, CKMB, CKMBINDEX, TROPONINI in the last 168 hours. CBG: Recent Labs  Lab 09/09/18 1738 09/09/18 2010 09/09/18 2255 09/10/18 0841 09/10/18 0917  GLUCAP 103* 120* 99 67* 95   Iron Studies: No results for input(s): IRON, TIBC, TRANSFERRIN, FERRITIN in the last 72 hours. @lablastinr3 @ Studies/Results: No results found. Medications: . sodium chloride    . sodium chloride    . sodium chloride    . sodium chloride    . cefTRIAXone (ROCEPHIN)  IV 2 g (09/09/18 1058)  . metronidazole 500 mg (09/10/18 0704)   . amiodarone  200 mg Oral Daily  . atorvastatin  40 mg Oral q1800  . Chlorhexidine Gluconate Cloth  6 each Topical Q0600  . darbepoetin (ARANESP) injection - DIALYSIS  100 mcg Intravenous Q Tue-HD  . doxercalciferol  1 mcg  Intravenous Q T,Th,Sa-HD  . heparin  4,000 Units Dialysis Once in dialysis  . hydrocortisone  25 mg Rectal BID  . insulin aspart  0-9 Units Subcutaneous TID WC  . insulin glargine  8 Units Subcutaneous QHS  . metoprolol tartrate  12.5 mg Oral BID  . multivitamin  1 tablet Oral QHS  . sevelamer carbonate  2,400 mg Oral TID WC  . sodium chloride flush  3 mL Intravenous Q12H  . warfarin  5 mg Oral Once  . Warfarin - Pharmacist Dosing Inpatient   Does not apply q1800   Dialysis Orders: TTS [place]  4 hr 180 NRe 400/800  94kg3 K/2.25 Ca  -Heparin 4000 units IV TIW -Hectorol 35mcg IV TIW -Aranesp 100 mcg IV weekly  Assessment/Plan: 1. Sepsis -Fever withWBC to 15.9 with ^ N from WBC 6/25 of 6.4 with normal diff- rather acute onset.WBC down to 6.3 near baselineNegativeCOVID/CXR, recent infected ext hemorrhoid, CT showed numerous small bowel mesenteric and retroperitoneal nodes, unintentional weight loss 5 kg this  month after several months of very stable weights worrisome. Trend T bili.2.7 upon admission With normal LFTsAntibiotics per primary 2. Rectal bleeding-went for colonoscopy and endoscopy today. No acute source of bleeding found.  3. ESRD- TTS -HD scheduled for today but bumped for endoscopy. May not get HD today D/T scheduling.  Use 4.0 K bath for K+ 3.3.  4. Hypertension/volume-BP soft side, possibly related to higher HR. Will with trace edema.  5. Anemia- ^ 8.8.  Aranesp 60 mcg IV 09/08/18. Follow HGB.  6. Metabolic bone disease- Not on VDRA. Continue binders.  7. Nutrition- renal diet/vits/supplements 8. PAF on chronic coumadin -MTP increased this month to 25 bid due to afib with RVR during ED visit 6/10; Was scheduled cardioversion for 09/11/18 but was off coumadin. Cardioversion appears to be on hold. Coumadin resumed this PM.  9. DM -Per primary  Rita H. Brown NP-C 09/10/2018, 5:09 PM  [employer] [phone]

## 2018-09-10 NOTE — Op Note (Signed)
[name] Patient Name: Travis Bryan Procedure Date : 09/10/2018 MRN: [id number] Attending MD: Otis Brace , MD Date of Birth: September 05, 1970 CSN: [id number] Age: 48 Admit Type: Inpatient Procedure:                Colonoscopy Indications:              This is the patient's first colonoscopy, Rectal                            bleeding Providers:                Otis Brace, MD, Grace Isaac, RN, Elspeth Cho Tech., Technician, Imagene Riches, CRNA Referring MD:              Medicines:                Sedation Administered by an Anesthesia Professional Complications:            No immediate complications. Estimated Blood Loss:     Estimated blood loss was minimal. Procedure:                Pre-Anesthesia Assessment:                           - Prior to the procedure, a History and Physical                            was performed, and patient medications and                            allergies were reviewed. The patient's tolerance of                            previous anesthesia was also reviewed. The risks                            and benefits of the procedure and the sedation                            options and risks were discussed with the patient.                            All questions were answered, and informed consent                            was obtained. Prior Anticoagulants: The patient has                            taken Coumadin (warfarin). ASA Grade Assessment:                            III - A patient with severe systemic disease. After  reviewing the risks and benefits, the patient was                            deemed in satisfactory condition to undergo the                            procedure.                           After obtaining informed consent, the colonoscope                            was passed under direct vision. Throughout the                            procedure,  the patient's blood pressure, pulse, and                            oxygen saturations were monitored continuously. The                            [id number]-H190DL ([id number] Olympus pediatric colonscope                            was introduced through the anus and advanced to the                            the terminal ileum, with identification of the                            appendiceal orifice and IC valve. The colonoscopy                            was performed without difficulty. The patient                            tolerated the procedure well. The quality of the                            bowel preparation was fair. Scope In: 10:15:41 AM Scope Out: 10:25:23 AM Scope Withdrawal Time: 0 hours 7 minutes 42 seconds  Total Procedure Duration: 0 hours 9 minutes 42 seconds  Findings:      Hemorrhoids were found on perianal exam.      The terminal ileum appeared normal.      A 3 mm polyp was found in the transverse colon. The polyp was removed       with a cold biopsy forceps. Resection and retrieval were complete.      A scattered area of mildly friable (with contact bleeding) mucosa was       found in the rectum, in the sigmoid colon, in the transverse colon and       in the ascending colon.      A few hyperplastic polyps were found in the recto-sigmoid colon. The       polyps were small in size. Polypectomy was not attempted.  Procedure was       performed quickly because of patient's underlying elevated heart rate       and borderline blood pressure.      Internal hemorrhoids were found during retroflexion. The hemorrhoids       were large. Impression:               - Preparation of the colon was fair.                           - Hemorrhoids found on perianal exam.                           - The examined portion of the ileum was normal.                           - One 3 mm polyp in the transverse colon, removed                            with a cold biopsy forceps. Resected and  retrieved.                           Dionisio David (with contact bleeding) mucosa in the                            rectum, in the sigmoid colon, in the transverse                            colon and in the ascending colon.                           - A few small polyps at the recto-sigmoid colon.                            Resection not attempted.                           - Internal hemorrhoids. Recommendation:           - Return patient to hospital ward for ongoing care.                           - Resume previous diet.                           - Continue present medications.                           - Await pathology results.                           - Repeat colonoscopy in 2 to 3 years for                            surveillance based on pathology results.                           -  Return to my office in 2 months. Procedure Code(s):        --- Professional ---                           [phone], Colonoscopy, flexible; with biopsy, single                            or multiple Diagnosis Code(s):        --- Professional ---                           K64.8, Other hemorrhoids                           K63.5, Polyp of colon                           K62.5, Hemorrhage of anus and rectum                           K92.2, Gastrointestinal hemorrhage, unspecified CPT copyright 2019 American Medical Association. All rights reserved. The codes documented in this report are preliminary and upon coder review may  be revised to meet current compliance requirements. Otis Brace, MD Otis Brace, MD 09/10/2018 10:39:52 AM Number of Addenda: 0

## 2018-09-10 NOTE — Progress Notes (Addendum)
PROGRESS NOTE  Travis Bryan GOT:[id number] DOB: 14-Aug-1970 DOA: 09/07/2018 PCP: [name], FNP  Brief History   Travis Bryan is a 48 y.o. male with medical history significant of HTN, HLD, ESRD on HD, A. fib, systolic CHF, s/p AICD, and gout; who presents with complaints of having shakes and a dry cough.  History is somewhat difficult to obtain from patient.  When he woke up this morning he reported generalized malaise, shortness of breath, and subjective fever.  Other associated symptoms include some upper abdominal discomfort.  Furthermore, when he had a bowel movement this morning he noted some bleeding. He was scheduled to have hemodialysis this morning, but this session due to his symptoms.  He was seen at an urgent care 12 days ago, for rectal abscess.  From review of records area noted to be draining on its own and suspected to possibly be an infected external hemorrhoid.  Patient was given clindamycin and discharged home.  Patient reports intermittent compliance with the clindamycin.    Upon admission into the emergency department patient was noted to be febrile up to 102.2 F, pulse up to 146 and atrial fibrillation with RVR, respirations 12-35, blood pressure 86/60, and O2 saturations maintained on room air.  Labs revealed WBCs 15.9, hemoglobin 9.2, BUN 34, creatinine 10.65, lactic acid 4.4, INR 4.  Chest x-ray was otherwise noted to be clear.  Patient was given 2 L of normal saline IV fluids, metronidazole, and Rocephin.  Patient only received half of his dose of vancomycin due to complaints of itching.  In the ED patient had a subsequent bowel movement with bright red blood noted in the toilet bowl.  CT scan of the abdomen and abdomen revealed numerous enlarged small bowel mesenteric and retroperitoneal lymph nodes that are new compared to prior exam. Possibly reactive, but lymphoma must be considered in the differential. PET scan should be considered. The liver contours are  consistent with cirrhosis. COVID 19 is negative.   The patient has been admitted to a medical bed. His coumadin was held due to his rectal bleeding. The patient had a cardioversion planned for Wednesday 09/11/2018, but this will need to be rescheduled as the patient has now been off of coumadin for 2 days. I left a message with Dr. Marlou Porch, the patient's cardiologist to advise him of this. The patient is very upset that his cardioversion will be delayed.  EGD and colonoscopy were performed today. The patient was found to have some  esophageal dysmotility, but no evidence of varices in the esophagus. There was some friable mucosa seen in the gastric fundus and in the body of the stomach. Duodenal bulb and the first and second part of the duodenum were normal.  Colonoscopy demonstrated a polyp in the transverse colon which was removed. There was a scattered area of mildly friable mucosa was seen in the rectum, transverse, ascending colon, and sigmoid colon. There were a few hyperplastic polyps in the recto-sigmoid colon. Internal hemorrhoids were found during retroflexion.  Consultants  . Gastroenterology . Nephrology  Procedures  . Dialysis today per nephrology  Antibiotics   Anti-infectives (From admission, onward)   Start     Dose/Rate Route Frequency Ordered Stop   09/08/18 0900  cefTRIAXone (ROCEPHIN) 2 g in sodium chloride 0.9 % 100 mL IVPB     2 g 200 mL/hr over 30 Minutes Intravenous Every 24 hours 09/07/18 1332     09/07/18 1500  metroNIDAZOLE (FLAGYL) IVPB 500 mg     500  mg 100 mL/hr over 60 Minutes Intravenous Every 8 hours 09/07/18 1332     09/07/18 1100  metroNIDAZOLE (FLAGYL) IVPB 500 mg     500 mg 100 mL/hr over 60 Minutes Intravenous  Once 09/07/18 1049 09/07/18 1338   09/07/18 0845  vancomycin (VANCOCIN) IVPB 1000 mg/200 mL premix     1,000 mg 200 mL/hr over 60 Minutes Intravenous  Once 09/07/18 0841 09/07/18 0950   09/07/18 0845  cefTRIAXone (ROCEPHIN) 2 g in sodium chloride  0.9 % 100 mL IVPB     2 g 200 mL/hr over 30 Minutes Intravenous  Once 09/07/18 0841 09/07/18 0950      Subjective  The patient is seen following GI procedures. He has returned to the room somnolent from anesthesia. He was hypoxic due to his obtunded state. SaO2 increased with supplemental O2.  Objective   Vitals:  Vitals:   09/10/18 1042 09/10/18 1052  BP: 103/71 91/68  Pulse: (!) 104 98  Resp: (!) 25 11  Temp:    SpO2: 98% 97%   Exam:  Constitutional:  . The patient is somnolent due to anesthesia. No acute distress. Respiratory:  . No increased work of breathing. . No wheezes, rales, or rhonchi . No tactile fremitus Cardiovascular:  . Irregular rate and rhythm, tachycardic . No murmurs ectopy or gallups . No lateral PMI. No thrills. Abdomen:  . Abdomen is soft, non-tender, non-distended. . No hernias, masses,s or organomegaly . Hyperactive bowel sounds. Musculoskeletal:  . No cyanosis, clubbing, or edema Skin:  . No rashes, lesions, ulcers . palpation of skin: no induration or nodules Neurologic:  . CN 2-12 intact . Sensation all 4 extremities intact Psychiatric:  . Mental status o Mood, affect appropriate o Orientation to person, place, time  . judgment and insight appear intact   I have personally reviewed the following:   Today's Data  . Vitals, CBC, BMP   Scheduled Meds: . amiodarone  200 mg Oral Daily  . atorvastatin  40 mg Oral q1800  . Chlorhexidine Gluconate Cloth  6 each Topical Q0600  . darbepoetin (ARANESP) injection - DIALYSIS  100 mcg Intravenous Q Tue-HD  . doxercalciferol  1 mcg Intravenous Q T,Th,Sa-HD  . heparin  4,000 Units Dialysis Once in dialysis  . hydrocortisone  25 mg Rectal BID  . insulin aspart  0-9 Units Subcutaneous TID WC  . insulin glargine  8 Units Subcutaneous QHS  . metoprolol tartrate  12.5 mg Oral BID  . multivitamin  1 tablet Oral QHS  . sevelamer carbonate  2,400 mg Oral TID WC  . sodium chloride flush  3 mL  Intravenous Q12H  . warfarin  5 mg Oral Once  . Warfarin - Pharmacist Dosing Inpatient   Does not apply q1800   Continuous Infusions: . sodium chloride    . sodium chloride    . sodium chloride    . sodium chloride    . cefTRIAXone (ROCEPHIN)  IV 2 g (09/09/18 1058)  . metronidazole 500 mg (09/10/18 0704)    Principal Problem:   Severe sepsis (HCC) Active Problems:   Type 2 diabetes mellitus (HCC)   Automatic implantable cardioverter-defibrillator in situ   Persistent atrial fibrillation   Rectal bleed   Acute blood loss anemia   Hypotension   LOS: 3 days    A & P   Severe sepsis: Acute. Blood pressure remains on the low side. No further fevers or tachycardia.  This morning's labs revealed WBC of 6.3 down from 15.9  on admission. Lactic acid is down to 1.4 from an initial lactic acid 4.4.  Imaging studies revealed multiple enlarged small bowel mesenteric and retroperitoneal lymph nodes that are new concerning for infectious or inflammatory process. Empiric antibiotics of Rocephin and metronidazole have been continued. Vancomycin was stopped due to itching.  The patient was admitted to a progressive bed. Blood cultures were obtained and have had no growth. Tylenol is available for fever. He has been continued on Rocephin and metronidazole.  Rectal bleeding, question acute blood loss anemia: Acute.  Patient reports having rectal cyst that has been draining on its own.  On physical exam no visible abscess seen draining.  Hemoglobin 9.2 g/dL which appear similar previous 18 days ago.  Question infectious/inflammatory cause versus  internal hemorrhoid. The patient has been typed and screened for possible need of blood products. H&H will be followed. GI performed and EGD and colonoscopy today.The patient was found to have some  esophageal dysmotility, but no evidence of varices in the esophagus. There was some friable mucosa seen in the gastric fundus and in the body of the stomach. Duodenal  bulb and the first and second part of the duodenum were normal.  Colonoscopy demonstrated a polyp in the transverse colon which was removed. There was a scattered area of mildly friable mucosa was seen in the rectum, transverse, ascending colon, and sigmoid colon. There were a few hyperplastic polyps in the recto-sigmoid colon. Internal hemorrhoids were found during retroflexion.  Hypoxia: Due to continued sedation after EGD and colonoscopy. SaO2 comes op to 90's with 3L O2. Monitor.  Persistent atrial fibrillation, supratherapeutic INR: Patient initially noted to have heart rates into the 140s in atrial fibrillation, but appear to be improving with IV fluids.  INR noted to be supratherapeutic at 4. Continue amiodarone and follow INR. Coumadin has been held. I contacted the office of Dr. Marlou Porch and let him know that the patient has been off of coumadin since Saturday. Will restart metoprolol with parameters.  Hypotension: Blood pressures initially noted to be as low as 86/60.  Patient given 2 L of normal saline IV fluids. Metoprolol held. Will repeat bolus given continued hypotension.  Resolving.  ESRD on HD: Patient normally dialyzes Tuesday, Thursday, Saturday.  He missed his session today due to his symptoms.  Potassium 4.1, BUN 34, creatinine 10.28. Continue renvela. Nephrology's assistance is greatly appreciated. He missed dialysis this morning due to EGD and colonoscopy.  Systolic congestive heart failure, status post AICD: Last EF noted to be 25 to 30% by echocardiogram in 02/2018. Strict intake and output.  Diabetes mellitus type 2: Last hemoglobin A1c relatively controlled on 08/28/2018 at 6.6. The patietn is on hypoglycemic protocols. Will restart lantus at 10 units daily. Home dose was 15 units daily. Glucoses will be followed by FSBS ans SSI.  I have seen and examined this patient myself. I have spent 36 minutes in his evaluation and care.  DVT prophylaxis: SCDs Code Status: Full  Family Communication: No family present at bedside Disposition Plan: Likely discharge home in 2 to 3 days  Teliyah Royal, DO [facility] Direct contact: see [email or web address]  7PM-7AM contact night coverage as above  09/10/2018, 3:41 PM  LOS: 1 day

## 2018-09-10 NOTE — Interval H&P Note (Signed)
History and Physical Interval Note:  09/10/2018 9:50 AM  Travis Bryan  has presented today for surgery, with the diagnosis of Cirrhosis of the liver, rectal bleeding.  The various methods of treatment have been discussed with the patient and family. After consideration of risks, benefits and other options for treatment, the patient has consented to  Procedure(s): ESOPHAGOGASTRODUODENOSCOPY (EGD) WITH PROPOFOL (N/A) COLONOSCOPY WITH PROPOFOL (N/A) as a surgical intervention.  The patient's history has been reviewed, patient examined, no change in status, stable for surgery.  I have reviewed the patient's chart and labs.  Questions were answered to the patient's satisfaction.     Adren Dollins

## 2018-09-10 NOTE — Anesthesia Procedure Notes (Signed)
Procedure Name: MAC Date/Time: 09/10/2018 10:34 AM Performed by: Imagene Riches, CRNA Pre-anesthesia Checklist: Patient identified, Suction available, Emergency Drugs available and Patient being monitored Patient Re-evaluated:Patient Re-evaluated prior to induction Oxygen Delivery Method: Nasal cannula

## 2018-09-10 NOTE — Op Note (Signed)
[facility] Patient Name: Travis Bryan Procedure Date : 09/10/2018 MRN: [id number] Attending MD: Otis Brace , MD Date of Birth: 13-Dec-1970 CSN: [id number] Age: 48 Admit Type: Inpatient Procedure:                Upper GI endoscopy Indications:              Cirrhosis rule out esophageal varices Providers:                Otis Brace, MD, Grace Isaac, RN, Elspeth Cho Tech., Technician, Odie Sera, CRNA Referring MD:              Medicines:                Sedation Administered by an Anesthesia Professional Complications:            No immediate complications. Estimated Blood Loss:     Estimated blood loss: none. Procedure:                Pre-Anesthesia Assessment:                           - Prior to the procedure, a History and Physical                            was performed, and patient medications and                            allergies were reviewed. The patient's tolerance of                            previous anesthesia was also reviewed. The risks                            and benefits of the procedure and the sedation                            options and risks were discussed with the patient.                            All questions were answered, and informed consent                            was obtained. Prior Anticoagulants: The patient has                            taken Coumadin (warfarin). ASA Grade Assessment:                            III - A patient with severe systemic disease. After                            reviewing the risks and benefits, the patient was  deemed in satisfactory condition to undergo the                            procedure.                           After obtaining informed consent, the endoscope was                            passed under direct vision. Throughout the                            procedure, the patient's blood pressure, pulse, and                       oxygen saturations were monitored continuously. The                            GIF-H190 ([id number]) Olympus gastroscope was                            introduced through the mouth, and advanced to the                            second part of duodenum. The upper GI endoscopy was                            accomplished without difficulty. The patient                            tolerated the procedure well. Scope In: Scope Out: Findings:      Abnormal motility was noted in the esophagus.      There is no endoscopic evidence of varices in the entire esophagus.      Scattered mild mucosal changes characterized by friability (with contact       bleeding) were found in the gastric fundus and in the gastric body.      The cardia and gastric fundus were normal on retroflexion.      The duodenal bulb, first portion of the duodenum and second portion of       the duodenum were normal.      Procedure was performed quickly because of patient's underlying elevated       heart rate and borderline blood pressure. Impression:               - Abnormal esophageal motility.                           - Friable (with contact bleeding) mucosa in the                            gastric fundus and gastric body.                           - Normal duodenal bulb, first portion of the  duodenum and second portion of the duodenum.                           - No specimens collected. Recommendation:           - Perform a colonoscopy today. Procedure Code(s):        --- Professional ---                           [phone], Esophagogastroduodenoscopy, flexible,                            transoral; diagnostic, including collection of                            specimen(s) by brushing or washing, when performed                            (separate procedure) Diagnosis Code(s):        --- Professional ---                           K22.4, Dyskinesia of esophagus                            K92.2, Gastrointestinal hemorrhage, unspecified                           K74.60, Unspecified cirrhosis of liver CPT copyright 2019 American Medical Association. All rights reserved. The codes documented in this report are preliminary and upon coder review may  be revised to meet current compliance requirements. Otis Brace, MD Otis Brace, MD 09/10/2018 10:34:49 AM Number of Addenda: 0

## 2018-09-10 NOTE — Brief Op Note (Addendum)
09/07/2018 - 09/10/2018  10:41 AM  PATIENT:  Travis Bryan  47 y.o. male  PRE-OPERATIVE DIAGNOSIS:  Cirrhosis of the liver, rectal bleeding  POST-OPERATIVE DIAGNOSIS:  EGD normal COLON polypectomy  PROCEDURE:  Procedure(s): ESOPHAGOGASTRODUODENOSCOPY (EGD) WITH PROPOFOL (N/A) COLONOSCOPY WITH PROPOFOL (N/A) POLYPECTOMY  SURGEON:  Surgeon(s) and Role:    * Camrynn Mcclintic, MD - Primary  Findings ----------- -EGD showed no gross varices.  Friable gastric mucosa but otherwise no active bleeding. -Colonoscopy also showed scattered areas of friable mucosa and large internal hemorrhoids.  Small transverse colon polyp removed with biopsy forcep.  No active bleeding.  Fair prep   Recommendations -------------------------- -Advance diet. - Anusol HC twice a day for 2 weeks -No further inpatient GI work-up planned. -Okay to resume anticoagulation from GI standpoint. -Follow-up in GI clinic in 2 months for further work-up on his cirrhosis.  GI will sign off.  Call us back if needed  Otis Brace MD, [place] 09/10/2018, 10:43 AM  Contact #  [phone]

## 2018-09-10 NOTE — Anesthesia Postprocedure Evaluation (Signed)
Anesthesia Post Note  Patient: Travis Bryan  Procedure(s) Performed: ESOPHAGOGASTRODUODENOSCOPY (EGD) WITH PROPOFOL (N/A ) COLONOSCOPY WITH PROPOFOL (N/A ) POLYPECTOMY     Patient location during evaluation: Endoscopy Anesthesia Type: MAC Level of consciousness: awake and alert Pain management: pain level controlled Vital Signs Assessment: post-procedure vital signs reviewed and stable Respiratory status: spontaneous breathing, nonlabored ventilation, respiratory function stable and patient connected to nasal cannula oxygen Cardiovascular status: stable and blood pressure returned to baseline Postop Assessment: no apparent nausea or vomiting Anesthetic complications: no    Last Vitals:  Vitals:   09/10/18 1032 09/10/18 1042  BP: (!) 129/33 103/71  Pulse: (!) 105 (!) 104  Resp: (!) 43 (!) 25  Temp: 36.7 C   SpO2: 91% 98%    Last Pain:  Vitals:   09/10/18 1042  TempSrc:   PainSc: 0-No pain                 Fontaine Kossman,W. EDMOND

## 2018-09-10 NOTE — Anesthesia Preprocedure Evaluation (Addendum)
Anesthesia Evaluation  Patient identified by MRN, date of birth, ID band Patient awake    Reviewed: Allergy & Precautions, H&P , NPO status , Patient's Chart, lab work & pertinent test results, reviewed documented beta blocker date and time   Airway Mallampati: II  TM Distance: >3 FB Neck ROM: Full    Dental no notable dental hx. (+) Poor Dentition, Dental Advisory Given   Pulmonary neg pulmonary ROS,    Pulmonary exam normal breath sounds clear to auscultation       Cardiovascular Exercise Tolerance: Good hypertension, Pt. on medications and Pt. on home beta blockers + Past MI and +CHF  + dysrhythmias Atrial Fibrillation + Cardiac Defibrillator  Rhythm:Regular Rate:Normal     Neuro/Psych CVA negative psych ROS   GI/Hepatic negative GI ROS, Neg liver ROS,   Endo/Other  diabetes, Type 1, Insulin Dependent  Renal/GU ESRFRenal disease  negative genitourinary   Musculoskeletal   Abdominal   Peds  Hematology  (+) Blood dyscrasia, anemia ,   Anesthesia Other Findings   Reproductive/Obstetrics negative OB ROS                            Anesthesia Physical Anesthesia Plan  ASA: III  Anesthesia Plan: MAC   Post-op Pain Management:    Induction: Intravenous  PONV Risk Score and Plan: 1 and Propofol infusion  Airway Management Planned: Nasal Cannula  Additional Equipment:   Intra-op Plan:   Post-operative Plan:   Informed Consent: I have reviewed the patients History and Physical, chart, labs and discussed the procedure including the risks, benefits and alternatives for the proposed anesthesia with the patient or authorized representative who has indicated his/her understanding and acceptance.     Dental advisory given  Plan Discussed with: CRNA  Anesthesia Plan Comments:         Anesthesia Quick Evaluation

## 2018-09-11 ENCOUNTER — Encounter (HOSPITAL_COMMUNITY): Payer: Self-pay

## 2018-09-11 ENCOUNTER — Ambulatory Visit (HOSPITAL_COMMUNITY): Admit: 2018-09-11 | Payer: Medicare Other | Admitting: Cardiology

## 2018-09-11 ENCOUNTER — Encounter (HOSPITAL_COMMUNITY): Payer: Self-pay | Admitting: Gastroenterology

## 2018-09-11 LAB — PROTIME-INR
INR: 2.7 — ABNORMAL HIGH (ref 0.8–1.2)
INR: 2.8 — ABNORMAL HIGH (ref 0.8–1.2)
Prothrombin Time: 28 seconds — ABNORMAL HIGH (ref 11.4–15.2)
Prothrombin Time: 29.3 seconds — ABNORMAL HIGH (ref 11.4–15.2)

## 2018-09-11 LAB — CBC
HCT: 25.7 % — ABNORMAL LOW (ref 39.0–52.0)
Hemoglobin: 8 g/dL — ABNORMAL LOW (ref 13.0–17.0)
MCH: 29.2 pg (ref 26.0–34.0)
MCHC: 31.1 g/dL (ref 30.0–36.0)
MCV: 93.8 fL (ref 80.0–100.0)
Platelets: 179 10*3/uL (ref 150–400)
RBC: 2.74 MIL/uL — ABNORMAL LOW (ref 4.22–5.81)
RDW: 16.1 % — ABNORMAL HIGH (ref 11.5–15.5)
WBC: 4.8 10*3/uL (ref 4.0–10.5)
nRBC: 0.6 % — ABNORMAL HIGH (ref 0.0–0.2)

## 2018-09-11 LAB — RENAL FUNCTION PANEL
Albumin: 2.6 g/dL — ABNORMAL LOW (ref 3.5–5.0)
Anion gap: 15 (ref 5–15)
BUN: 28 mg/dL — ABNORMAL HIGH (ref 6–20)
CO2: 23 mmol/L (ref 22–32)
Calcium: 8.8 mg/dL — ABNORMAL LOW (ref 8.9–10.3)
Chloride: 100 mmol/L (ref 98–111)
Creatinine, Ser: 9.22 mg/dL — ABNORMAL HIGH (ref 0.61–1.24)
GFR calc Af Amer: 7 mL/min — ABNORMAL LOW (ref >60)
GFR calc non Af Amer: 6 mL/min — ABNORMAL LOW (ref >60)
Glucose, Bld: 109 mg/dL — ABNORMAL HIGH (ref 70–99)
Phosphorus: 4.1 mg/dL (ref 2.5–4.6)
Potassium: 3.1 mmol/L — ABNORMAL LOW (ref 3.5–5.1)
Sodium: 138 mmol/L (ref 135–145)

## 2018-09-11 LAB — GLUCOSE, CAPILLARY
Glucose-Capillary: 109 mg/dL — ABNORMAL HIGH (ref 70–99)
Glucose-Capillary: 100 mg/dL — ABNORMAL HIGH (ref 70–99)
Glucose-Capillary: 102 mg/dL — ABNORMAL HIGH (ref 70–99)
Glucose-Capillary: 144 mg/dL — ABNORMAL HIGH (ref 70–99)

## 2018-09-11 SURGERY — CARDIOVERSION
Anesthesia: General

## 2018-09-11 MED ORDER — DOCUSATE SODIUM 100 MG PO CAPS
100.0000 mg | ORAL_CAPSULE | Freq: Two times a day (BID) | ORAL | Status: DC
  Administered 2018-09-12: 100 mg via ORAL
  Filled 2018-09-11: qty 1

## 2018-09-11 MED ORDER — WARFARIN SODIUM 2.5 MG PO TABS
2.5000 mg | ORAL_TABLET | Freq: Once | ORAL | Status: AC
  Administered 2018-09-11: 2.5 mg via ORAL
  Filled 2018-09-11: qty 1

## 2018-09-11 MED ORDER — HEPARIN SODIUM (PORCINE) 1000 UNIT/ML IJ SOLN
3.4000 mL | Freq: Once | INTRAMUSCULAR | Status: AC
  Administered 2018-09-11: 3400 [IU] via INTRAVENOUS

## 2018-09-11 MED ORDER — DOXERCALCIFEROL 4 MCG/2ML IV SOLN
INTRAVENOUS | Status: AC
  Filled 2018-09-11: qty 2

## 2018-09-11 MED ORDER — HEPARIN SODIUM (PORCINE) 1000 UNIT/ML IJ SOLN
INTRAMUSCULAR | Status: AC
  Filled 2018-09-11: qty 4

## 2018-09-11 MED ORDER — DOCUSATE SODIUM 100 MG PO CAPS
200.0000 mg | ORAL_CAPSULE | Freq: Once | ORAL | Status: AC
  Administered 2018-09-11: 22:00:00 200 mg via ORAL
  Filled 2018-09-11: qty 2

## 2018-09-11 NOTE — Progress Notes (Signed)
ANTICOAGULATION CONSULT NOTE - Initial Consult  Pharmacy Consult : Resume Warfarin  Indication: atrial fibrillation  Allergies  Allergen Reactions  . No Known Allergies     Patient Measurements: Weight: 208 lb 5.4 oz (94.5 kg) Height: 72 inches  Vital Signs: Temp: 98.3 F (36.8 C) (07/01 0840) Temp Source: Oral (07/01 0840) BP: 108/66 (07/01 0545) Pulse Rate: 115 (07/01 0545)  Labs: Recent Labs    09/09/18 0403 09/10/18 0419 09/10/18 1159 09/11/18 1125  HGB 7.9* 8.2* 8.8* 8.0*  HCT 25.1* 26.4* 28.5* 25.7*  PLT 183 179 198 179  LABPROT  --   --   --  28.0*  INR  --   --   --  2.7*  CREATININE 7.87* 10.28*  --  9.22*    Estimated Creatinine Clearance: 11.8 mL/min (A) (by C-G formula based on SCr of 9.22 mg/dL (H)).   Medical History: Past Medical History:  Diagnosis Date  . AICD (automatic cardioverter/defibrillator) present   . Atrial flutter ([place])    a. s/p ablation 03/2016  . Chronic kidney disease   . Chronic systolic CHF (congestive heart failure) ([place])   . HCAP (healthcare-associated pneumonia) 03/28/2018  . History of gout   . Hyperlipidemia   . Hypertension   . Myocardial infarction [facility])    "I think I had one a long long time ago" (03/28/2016)  . NICM (nonischemic cardiomyopathy) ([place])    a. LHC 6/06: pLAD 20, pLCx 20-30; b. Echo 5/15:  EF 15%, diffuse HK, restrictive physiology, trivial AI, trivial MR, mild LAE, moderate RVE, moderately reduced RVSF, moderate RAE, mild to moderate TR, PASP 43 mmHg  . Obesity   . Persistent atrial fibrillation   . Stroke ([place])   . Type II diabetes mellitus (HCC)     Medications:  Medications Prior to Admission  Medication Sig Dispense Refill Last Dose  . acetaminophen (TYLENOL) 500 MG tablet Take 1,000 mg by mouth every 6 (six) hours as needed for mild pain or headache.   unk  . amiodarone (PACERONE) 200 MG tablet Take 1 tablet (200 mg total) by mouth daily. 30 tablet 0 09/06/2018  . atorvastatin (LIPITOR) 40 MG  tablet Take 1 tablet (40 mg total) by mouth daily at 6 PM. 90 tablet 2 09/06/2018  . insulin glargine (LANTUS) 100 UNIT/ML injection Inject 0.15 mLs (15 Units total) into the skin at bedtime as needed. 10 mL 5 unk  . metoprolol tartrate (LOPRESSOR) 25 MG tablet Take 1 tablet (25 mg total) by mouth 2 (two) times daily. 60 tablet 0 09/06/2018  . multivitamin (RENA-VIT) TABS tablet TAKE 1 TABLET BY MOUTH AT BEDTIME. 30 tablet 6 09/06/2018  . sevelamer carbonate (RENVELA) 800 MG tablet Take 1 tablet (800 mg total) by mouth 3 (three) times daily with meals. 90 tablet 3 09/06/2018  . torsemide (DEMADEX) 20 MG tablet Take 60 mg by mouth daily.   unk  . warfarin (COUMADIN) 5 MG tablet Take As Directed by Coumadin Clinic (Patient taking differently: Take 5-7.5 mg by mouth See admin instructions. Take As Directed by Coumadin Clinic 5 mg (5 mg x 1) every Tue, Thu, Sat; 7.5 mg (5 mg x 1.5) all other days) 45 tablet 3 09/06/2018  . insulin aspart (NOVOLOG) 100 UNIT/ML injection Use 3 units three times a day with meals (Patient taking differently: Inject 0-3 Units into the skin 3 (three) times daily with meals. Use 3 units three times a day with meals per sliding scale.) 10 mL 3 08/24/2018  .  Insulin Syringe-Needle U-100 31G X 15/64" 0.3 ML MISC Use to inject insulin 4 times a day 200 each 12     Assessment: 48 y.o male with h/o ESRD-HD TTSat and h/o Afib, on warfarin PTA, admitted on 6/27 for severe sepsis and rectal bleeding. PTA INR 4.0 on 6/25.  He received Vitamin K 5 mg IVPB x 1 on 6/27 >> INR 2.1 on 6/28 post vitamin K. Warfarin was held here due to rectal bleeding>>Resumed Warfarin 6/30.   PTA  warfarin dose:  7.5mg  daily except 5mg  qTTSat, LD pta on 6/26 , INR 4.0 on 6/24 and INR 2.1 on 6/28, admitted 6/27  On amiodarone pta, continued on admission for Atrial fibrillation  On 09/10/18 s/p EGD : normal,  no active bleeding and s/p colonoscopy 6/30  showed scattered areas of friable mucosa and large internal  hemorrhoids. No source of bleeding found.  Polyp removed with biopsy.  GI okay'd to resume warfarin.  INR today is INR 2.7 up from 2.1 on 6/28.  Cautious dosing of warfarin due to possible drug interaction with Flagyl (new this admit) which increases warfarin effect.  Hgb 8.0 low/stable, pltc wnl stable. No bleeding reported.    Goal of Therapy:  INR 2-3 Monitor platelets by anticoagulation protocol: Yes   Plan:   Warfarin 2.5 mg today x1  (lower dose due to Flagyl new, watch for DDI) Daily INR, CBC  Thank you for allowing pharmacy to be part of this patients care team.  Nicole Cella, RPh Clinical Pharmacist [phone] Please check AMION for all [place] phone numbers After 10:00 PM, call [place] [phone] 09/11/2018,1:16 PM

## 2018-09-11 NOTE — Progress Notes (Signed)
PROGRESS NOTE  Hitoshi Werts BJY:[id number] DOB: 1971/01/24 DOA: 09/07/2018 PCP: [name], FNP  Brief History   Travis Bryan is a 48 y.o. male with medical history significant of HTN, HLD, ESRD on HD, A. fib, systolic CHF, s/p AICD, and gout; who presents with complaints of having shakes and a dry cough.  History is somewhat difficult to obtain from patient.  When he woke up this morning he reported generalized malaise, shortness of breath, and subjective fever.  Other associated symptoms include some upper abdominal discomfort.  Furthermore, when he had a bowel movement this morning he noted some bleeding. He was scheduled to have hemodialysis this morning, but this session due to his symptoms.  He was seen at an urgent care 12 days ago, for rectal abscess.  From review of records area noted to be draining on its own and suspected to possibly be an infected external hemorrhoid.  Patient was given clindamycin and discharged home.  Patient reports intermittent compliance with the clindamycin.    Upon admission into the emergency department patient was noted to be febrile up to 102.2 F, pulse up to 146 and atrial fibrillation with RVR, respirations 12-35, blood pressure 86/60, and O2 saturations maintained on room air.  Labs revealed WBCs 15.9, hemoglobin 9.2, BUN 34, creatinine 10.65, lactic acid 4.4, INR 4.  Chest x-ray was otherwise noted to be clear.  Patient was given 2 L of normal saline IV fluids, metronidazole, and Rocephin.  Patient only received half of his dose of vancomycin due to complaints of itching.  In the ED patient had a subsequent bowel movement with bright red blood noted in the toilet bowl.  CT scan of the abdomen and abdomen revealed numerous enlarged small bowel mesenteric and retroperitoneal lymph nodes that are new compared to prior exam. Possibly reactive, but lymphoma must be considered in the differential. PET scan should be considered. The liver contours are  consistent with cirrhosis. COVID 19 is negative.   The patient has been admitted to a medical bed. His coumadin was held due to his rectal bleeding. The patient had a cardioversion planned for Wednesday 09/11/2018, but this will need to be rescheduled as the patient has now been off of coumadin for 2 days. I left a message with Dr. Marlou Porch, the patient's cardiologist to advise him of this. The patient is very upset that his cardioversion will be delayed.  EGD and colonoscopy were performed today. The patient was found to have some  esophageal dysmotility, but no evidence of varices in the esophagus. There was some friable mucosa seen in the gastric fundus and in the body of the stomach. Duodenal bulb and the first and second part of the duodenum were normal.  Colonoscopy demonstrated a polyp in the transverse colon which was removed. There was a scattered area of mildly friable mucosa was seen in the rectum, transverse, ascending colon, and sigmoid colon. There were a few hyperplastic polyps in the recto-sigmoid colon. Internal hemorrhoids were found during retroflexion.  Elective cardioversion scheduled for today has been cancelled due to the need to dc coumadin because of GI bleed.  Consultants  . Gastroenterology . Nephrology  Procedures  . Dialysis today per nephrology  Antibiotics   Anti-infectives (From admission, onward)   Start     Dose/Rate Route Frequency Ordered Stop   09/08/18 0900  cefTRIAXone (ROCEPHIN) 2 g in sodium chloride 0.9 % 100 mL IVPB     2 g 200 mL/hr over 30 Minutes Intravenous Every 24  hours 09/07/18 1332     09/07/18 1500  metroNIDAZOLE (FLAGYL) IVPB 500 mg     500 mg 100 mL/hr over 60 Minutes Intravenous Every 8 hours 09/07/18 1332     09/07/18 1100  metroNIDAZOLE (FLAGYL) IVPB 500 mg     500 mg 100 mL/hr over 60 Minutes Intravenous  Once 09/07/18 1049 09/07/18 1338   09/07/18 0845  vancomycin (VANCOCIN) IVPB 1000 mg/200 mL premix     1,000 mg 200 mL/hr over 60  Minutes Intravenous  Once 09/07/18 0841 09/07/18 0950   09/07/18 0845  cefTRIAXone (ROCEPHIN) 2 g in sodium chloride 0.9 % 100 mL IVPB     2 g 200 mL/hr over 30 Minutes Intravenous  Once 09/07/18 0841 09/07/18 0950      Subjective  The patient is resting comfortably. Heart rate remains high. No other new complaints.  Objective   Vitals:  Vitals:   09/11/18 0545 09/11/18 0840  BP: 108/66   Pulse: (!) 115   Resp: 18   Temp: 98.4 F (36.9 C) 98.3 F (36.8 C)  SpO2: 95%    Exam:  Constitutional:  . The patient is somnolent due to anesthesia. No acute distress. Respiratory:  . No increased work of breathing. . No wheezes, rales, or rhonchi . No tactile fremitus Cardiovascular:  . Irregular rate and rhythm, tachycardic . No murmurs ectopy or gallups . No lateral PMI. No thrills. Abdomen:  . Abdomen is soft, non-tender, non-distended. . No hernias, masses,s or organomegaly . Hyperactive bowel sounds. Musculoskeletal:  . No cyanosis, clubbing, or edema Skin:  . No rashes, lesions, ulcers . palpation of skin: no induration or nodules Neurologic:  . CN 2-12 intact . Sensation all 4 extremities intact Psychiatric:  . Mental status o Mood, affect appropriate o Orientation to person, place, time  . judgment and insight appear intact   I have personally reviewed the following:   Today's Data  . Vitals, CBC, BMP   Scheduled Meds: . amiodarone  200 mg Oral Daily  . atorvastatin  40 mg Oral q1800  . Chlorhexidine Gluconate Cloth  6 each Topical Q0600  . darbepoetin (ARANESP) injection - DIALYSIS  100 mcg Intravenous Q Tue-HD  . doxercalciferol  1 mcg Intravenous Q T,Th,Sa-HD  . hydrocortisone  25 mg Rectal BID  . insulin aspart  0-9 Units Subcutaneous TID WC  . insulin glargine  8 Units Subcutaneous QHS  . metoprolol tartrate  12.5 mg Oral BID  . multivitamin  1 tablet Oral QHS  . sevelamer carbonate  2,400 mg Oral TID WC  . sodium chloride flush  3 mL  Intravenous Q12H  . warfarin  2.5 mg Oral ONCE-1800  . Warfarin - Pharmacist Dosing Inpatient   Does not apply q1800   Continuous Infusions: . cefTRIAXone (ROCEPHIN)  IV 2 g (09/11/18 0937)  . metronidazole 500 mg (09/11/18 1558)    Principal Problem:   Severe sepsis (HCC) Active Problems:   Type 2 diabetes mellitus (HCC)   Automatic implantable cardioverter-defibrillator in situ   Persistent atrial fibrillation   Rectal bleed   Acute blood loss anemia   Hypotension   LOS: 4 days    A & P   Severe sepsis: Acute. Blood pressure remains on the low side. No further fevers or tachycardia.  This morning's labs revealed WBC of 6.3 down from 15.9 on admission. Lactic acid is down to 1.4 from an initial lactic acid 4.4.  Imaging studies revealed multiple enlarged small bowel mesenteric and retroperitoneal  lymph nodes that are new concerning for infectious or inflammatory process. Empiric antibiotics of Rocephin and metronidazole have been continued. Vancomycin was stopped due to itching.  The patient was admitted to a progressive bed. Blood cultures were obtained and have had no growth. Tylenol is available for fever. He has been continued on Rocephin and metronidazole.  Rectal bleeding, question acute blood loss anemia: Acute.  Patient reports having rectal cyst that has been draining on its own.  On physical exam no visible abscess seen draining.  Hemoglobin 9.2 g/dL which appear similar previous 18 days ago.  Question infectious/inflammatory cause versus  internal hemorrhoid. The patient has been typed and screened for possible need of blood products. H&H will be followed. GI performed and EGD and colonoscopy today.The patient was found to have some  esophageal dysmotility, but no evidence of varices in the esophagus. There was some friable mucosa seen in the gastric fundus and in the body of the stomach. Duodenal bulb and the first and second part of the duodenum were normal.  Colonoscopy  demonstrated a polyp in the transverse colon which was removed. There was a scattered area of mildly friable mucosa was seen in the rectum, transverse, ascending colon, and sigmoid colon. There were a few hyperplastic polyps in the recto-sigmoid colon. Internal hemorrhoids were found during retroflexion. Coumadin restarted. Pharmacy managing.  Hypoxia: Due to continued sedation after EGD and colonoscopy. SaO2 comes op to 90's with 3L O2. Monitor.  Persistent atrial fibrillation, supratherapeutic INR: Patient initially noted to have heart rates into the 140s in atrial fibrillation, but appear to be improving with IV fluids.  INR noted to be supratherapeutic at 4. Continue amiodarone and follow INR. Coumadin has been held. I contacted the office of Dr. Marlou Porch and let him know that the patient has been off of coumadin since Saturday. Will restart metoprolol with parameters. I have consulted cardiology to assist with AF with RVR.   Hypotension: Blood pressures initially noted to be as low as 86/60.  Patient given 2 L of normal saline IV fluids. Metoprolol held.   ESRD on HD: Patient normally dialyzes Tuesday, Thursday, Saturday.  He missed his session today due to his symptoms.  Potassium 4.1, BUN 34, creatinine 10.28. Continue renvela. Nephrology's assistance is greatly appreciated. He missed dialysis this morning due to EGD and colonoscopy.  Systolic congestive heart failure, status post AICD: Last EF noted to be 25 to 30% by echocardiogram in 02/2018. Strict intake and output.  Diabetes mellitus type 2: Last hemoglobin A1c relatively controlled on 08/28/2018 at 6.6. The patietn is on hypoglycemic protocols. Will restart lantus at 10 units daily. Home dose was 15 units daily. Glucoses will be followed by FSBS ans SSI.  I have seen and examined this patient myself. I have spent 32 minutes in his evaluation and care.  DVT prophylaxis: SCDs Code Status: Full Family Communication: No family present at  bedside Disposition Plan: Likely discharge home tomorrow if heart rate is controlled.  Katie Moch, DO [facility] Direct contact: see [email or web address]  7PM-7AM contact night coverage as above  09/11/2018, 3:41 PM  LOS: 1 day

## 2018-09-11 NOTE — Progress Notes (Signed)
Tele reported to RN that pt was in [place]. RN assessed pt. He was alert and oriented with no signs of distress and/or chest pain. RN replaced all of pt's leads and checked stepdown monitor connections. Despite these interventions pt stayed in [place] before converting back to A Fib. RN reported findings to [facility] MD. EKG ordered. RN will continue to monitor pt.

## 2018-09-11 NOTE — Discharge Instructions (Signed)

## 2018-09-11 NOTE — Consult Note (Addendum)
Cardiology Consultation:   Patient ID: Travis Bryan; [id number]; [birth year]   Admit date: 09/07/2018 Date of Consult: 09/11/2018  Primary Care Provider: [name], FNP Primary Cardiologist: Loralie Champagne, MD Primary Electrophysiologist:  None    Patient Profile:   Travis Bryan is a 48 y.o. male with a PMH of non-ischemic cardioyopathy s/p AICD, chronic combined CHF, HTN, HLD, persistent atrial fibrillation on coumadin, VT/VF, DM type 2, ESRD on HD who is being seen today for the evaluation of atrial fibrillation at the request of Dr. Benny Lennert.  History of Present Illness:   Mr. Mcquarrie was last evaluated by cardiology at an outpatient visit with Roderic Palau, NP 09/02/2018 at the atrial fibrillation clinic. He reported fatigue and SOB at that time and was recommended to undergo DCCV given inability to titrate BBlocker due to hypotension and amiodarone due to QT interval and wide QRS. Unfortunately, patient presented to the ED 09/07/2018 with complaints of shakes, generalized malaise, and SOB. He was febrile to 102, tachycardic to the 146 with EKG revealing Afib RVR, andy hypotensive. While in the ED he had a BM with bright red blood noted. He was admitted for sepsis and an acute GI bleed. Home coumadin was held and INR reversed with vitamin K given supratherapeutic INR of 4. He has been maintained on IV antibiotics for possible infectious vs inflammatory findings on CT A/P. He underwent an EGD/C-scope 09/10/2018 which showed some esophageal dysmotility, but no evidence of varices in the esophagus. There was some friable mucosa seen in the gastric fundus and in the body of the stomach. Duodenal bulb and the first and second part of the duodenum were normal.  Colonoscopy demonstrated a polyp in the transverse colon which was removed. There was a scattered area of mildly friable mucosa was seen in the rectum, transverse, ascending colon, and sigmoid colon. There were a few hyperplastic polyps in  the recto-sigmoid colon. Internal hemorrhoids were found during retroflexion. He was cleared to resume anticoagulation from a GI standpoint. He was previously scheduled for DCCV today, however given interruption in anticoagulation, this was postponed. His hospital course has been complicated by intermittent atrial fibrillation with RVR, for which cardiology was consulted.  At the time of this evaluation he reports feeling overall improved from admission. He reports having significant DOE and fatigue for the past month since being back in atrial fibrillation. No complaints of chest pain, orthopnea, PND, LE edema, dizziness, lightheadedness, or syncope.   INR 2.7 today. Hgb 8.0 today; baseline appears to be ~9.    Past Medical History:  Diagnosis Date   AICD (automatic cardioverter/defibrillator) present    Atrial flutter ([place])    a. s/p ablation 03/2016   Chronic kidney disease    Chronic systolic CHF (congestive heart failure) ([place])    HCAP (healthcare-associated pneumonia) 03/28/2018   History of gout    Hyperlipidemia    Hypertension    Myocardial infarction ([place])    "I think I had one a long long time ago" (03/28/2016)   NICM (nonischemic cardiomyopathy) ([place])    a. LHC 6/06: pLAD 20, pLCx 20-30; b. Echo 5/15:  EF 15%, diffuse HK, restrictive physiology, trivial AI, trivial MR, mild LAE, moderate RVE, moderately reduced RVSF, moderate RAE, mild to moderate TR, PASP 43 mmHg   Obesity    Persistent atrial fibrillation    Stroke ([place])    Type II diabetes mellitus ([place])     Past Surgical History:  Procedure Laterality Date   AV FISTULA PLACEMENT  Left 04/25/2018   Procedure: ARTERIOVENOUS (AV) VS GRAFT ARM;  Surgeon: Serafina Mitchell, MD;  Location: [facility];  Service: Vascular;  Laterality: Left;   [place] Left 07/19/2018   Procedure: SECOND STAGE BASILIC VEIN TRANSPOSITION LEFT ARM;  Surgeon: Serafina Mitchell, MD;  Location: [place];  Service: Vascular;   Laterality: Left;   CARDIOVERSION N/A 04/07/2016   Procedure: CARDIOVERSION;  Surgeon: Thayer Headings, MD;  Location: [place];  Service: Cardiovascular;  Laterality: N/A;   CARDIOVERSION N/A 04/11/2016   Procedure: CARDIOVERSION;  Surgeon: Larey Dresser, MD;  Location: [place];  Service: Cardiovascular;  Laterality: N/A;   ELECTROPHYSIOLOGIC STUDY N/A 03/31/2016   Procedure: A-Flutter Ablation;  Surgeon: Evans Lance, MD;  Location: [place] CV LAB;  Service: Cardiovascular;  Laterality: N/A;   EXCHANGE OF A DIALYSIS CATHETER Left 04/25/2018   Procedure: Attempted Exchange Of A Dialysis Catheter on the Left side;  Surgeon: Serafina Mitchell, MD;  Location: [place];  Service: Vascular;  Laterality: Left;   IMPLANTABLE CARDIOVERTER DEFIBRILLATOR (ICD) GENERATOR CHANGE N/A 08/27/2013   Procedure: ICD GENERATOR CHANGE;  Surgeon: Evans Lance, MD;  Location: [facility] CATH LAB;  Service: Cardiovascular;  Laterality: N/A;   INSERTION OF DIALYSIS CATHETER Right 04/25/2018   Procedure: INSERTION OF TUNNELED DIALYSIS CATHETER;  Surgeon: Serafina Mitchell, MD;  Location: [facility];  Service: Vascular;  Laterality: Right;   TEE WITHOUT CARDIOVERSION N/A 03/31/2016   Procedure: TRANSESOPHAGEAL ECHOCARDIOGRAM (TEE);  Surgeon: Larey Dresser, MD;  Location: [place];  Service: Cardiovascular;  Laterality: N/A;     Home Medications:  Prior to Admission medications   Medication Sig Start Date End Date Taking? Authorizing Provider  acetaminophen (TYLENOL) 500 MG tablet Take 1,000 mg by mouth every 6 (six) hours as needed for mild pain or headache.   Yes [provider]  amiodarone (PACERONE) 200 MG tablet Take 1 tablet (200 mg total) by mouth daily. 05/30/18  Yes Larey Dresser, MD  atorvastatin (LIPITOR) 40 MG tablet Take 1 tablet (40 mg total) by mouth daily at 6 PM. 05/13/18  Yes [name], FNP  insulin glargine (LANTUS) 100 UNIT/ML injection Inject 0.15 mLs (15 Units total) into  the skin at bedtime as needed. 09/01/18  Yes [name], FNP  metoprolol tartrate (LOPRESSOR) 25 MG tablet Take 1 tablet (25 mg total) by mouth 2 (two) times daily. 09/22/43  Yes Delora Fuel, MD  multivitamin (RENA-VIT) TABS tablet TAKE 1 TABLET BY MOUTH AT BEDTIME. 07/29/18  Yes [name], FNP  sevelamer carbonate (RENVELA) 800 MG tablet Take 1 tablet (800 mg total) by mouth 3 (three) times daily with meals. 05/13/18  Yes [name], FNP  torsemide (DEMADEX) 20 MG tablet Take 60 mg by mouth daily.   Yes [provider]  warfarin (COUMADIN) 5 MG tablet Take As Directed by Coumadin Clinic Patient taking differently: Take 5-7.5 mg by mouth See admin instructions. Take As Directed by Coumadin Clinic 5 mg (5 mg x 1) every Tue, Thu, Sat; 7.5 mg (5 mg x 1.5) all other days 05/24/18  Yes Larey Dresser, MD  insulin aspart (NOVOLOG) 100 UNIT/ML injection Use 3 units three times a day with meals Patient taking differently: Inject 0-3 Units into the skin 3 (three) times daily with meals. Use 3 units three times a day with meals per sliding scale. 05/13/18 05/13/19  [name], FNP  Insulin Syringe-Needle U-100 31G X 15/64" 0.3 ML  MISC Use to inject insulin 4 times a day 05/13/18   [name], FNP    Inpatient Medications: Scheduled Meds:  amiodarone  200 mg Oral Daily   atorvastatin  40 mg Oral q1800   Chlorhexidine Gluconate Cloth  6 each Topical Q0600   darbepoetin (ARANESP) injection - DIALYSIS  100 mcg Intravenous Q Tue-HD   doxercalciferol  1 mcg Intravenous Q T,Th,Sa-HD   hydrocortisone  25 mg Rectal BID   insulin aspart  0-9 Units Subcutaneous TID WC   insulin glargine  8 Units Subcutaneous QHS   metoprolol tartrate  12.5 mg Oral BID   multivitamin  1 tablet Oral QHS   sevelamer carbonate  2,400 mg Oral TID WC   sodium chloride flush  3 mL Intravenous Q12H   Warfarin - Pharmacist Dosing Inpatient   Does not apply q1800   Continuous  Infusions:  cefTRIAXone (ROCEPHIN)  IV 2 g (09/11/18 9450)   metronidazole 500 mg (09/11/18 0654)   PRN Meds: acetaminophen **OR** acetaminophen, albuterol, ondansetron **OR** ondansetron (ZOFRAN) IV  Allergies:    Allergies  Allergen Reactions   No Known Allergies     Social History:   Social History   Socioeconomic History   Marital status: Widowed    Spouse name: Not on file   Number of children: Not on file   Years of education: Not on file   Highest education level: Not on file  Occupational History   Not on file  Social Needs   Financial resource strain: Not on file   Food insecurity    Worry: Not on file    Inability: Not on file   Transportation needs    Medical: Not on file    Non-medical: Not on file  Tobacco Use   Smoking status: Never Smoker   Smokeless tobacco: Never Used   Tobacco comment: pt denies smoking cigars  Substance and Sexual Activity   Alcohol use: No   Drug use: No   Sexual activity: Not Currently  Lifestyle   Physical activity    Days per week: Not on file    Minutes per session: Not on file   Stress: Not on file  Relationships   Social connections    Talks on phone: Not on file    Gets together: Not on file    Attends religious service: Not on file    Active member of club or organization: Not on file    Attends meetings of clubs or organizations: Not on file    Relationship status: Not on file   Intimate partner violence    Fear of current or ex partner: Not on file    Emotionally abused: Not on file    Physically abused: Not on file    Forced sexual activity: Not on file  Other Topics Concern   Not on file  Social History Narrative   Not on file    Family History:    Family History  Problem Relation Age of Onset   Hypertension Mother    Heart disease Mother    Diabetes Mother      ROS:  Please see the history of present illness.   All other ROS reviewed and negative.     Physical  Exam/Data:   Vitals:   09/11/18 0500 09/11/18 0530 09/11/18 0545 09/11/18 0840  BP: 110/78 (!) 104/58 108/66   Pulse: (!) 122 (!) 112 (!) 115   Resp: 16 17 18    Temp:   98.4 F (  36.9 C) 98.3 F (36.8 C)  TempSrc:   Oral Oral  SpO2:   95%   Weight:   94.5 kg     Intake/Output Summary (Last 24 hours) at 09/11/2018 1314 Last data filed at 09/11/2018 0900 Gross per 24 hour  Intake 240 ml  Output 500 ml  Net -260 ml   Filed Weights   09/09/18 0433 09/11/18 0240 09/11/18 0545  Weight: 93.7 kg 95 kg 94.5 kg   Body mass index is 28.26 kg/m.  General:  Well nourished, well developed, in no acute distress HEENT: sclera anicteric  Lymph: no adenopathy Neck: no JVD Endocrine:  No thryomegaly Vascular: No carotid bruits; distal pulses 2+ bilaterally Cardiac:  normal S1, S2; IRIR; no murmurs, rubs, or gallops Lungs:  clear to auscultation bilaterally, no wheezing, rhonchi or rales  Abd: NABS, soft, nontender, no hepatomegaly Ext: no edema Musculoskeletal:  No deformities, BUE and BLE strength normal and equal Skin: warm and dry  Neuro:  CNs 2-12 intact, no focal abnormalities noted Psych:  Normal affect   EKG:  The EKG was personally reviewed and demonstrates:  Atrial fibrillation with RVR, rate 153, non-specific IVCD (chronic), LAD; corrected QTC okay Telemetry:  Telemetry was personally reviewed and demonstrates:  Atrial fibrillation with rates primarily in the 100s with occasional rise to 120s.   Relevant CV Studies: Echocardiogram 02/2018: Study Conclusions  - Left ventricle: Diffuse hypokinesis worse in the inferior wall.   The cavity size was moderately dilated. Wall thickness was   increased in a pattern of mild LVH. Systolic function was   severely reduced. The estimated ejection fraction was in the   range of 25% to 30%. Doppler parameters are consistent with both   elevated ventricular end-diastolic filling pressure and elevated   left atrial filling pressure. - Left  atrium: The atrium was mildly dilated. - Atrial septum: No defect or patent foramen ovale was identified.  Laboratory Data:  Chemistry Recent Labs  Lab 09/09/18 0403 09/10/18 0419 09/11/18 1125  NA 137 139 138  K 3.5 3.3* 3.1*  CL 98 101 100  CO2 23 24 23   GLUCOSE 111* 87 109*  BUN 24* 31* 28*  CREATININE 7.87* 10.28* 9.22*  CALCIUM 9.0 9.2 8.8*  GFRNONAA 7* 5* 6*  GFRAA 9* 6* 7*  ANIONGAP 16* 14 15    Recent Labs  Lab 09/07/18 0819 09/08/18 0457 09/11/18 1125  PROT 7.5  --   --   ALBUMIN 3.0* 2.6* 2.6*  AST 37  --   --   ALT 38  --   --   ALKPHOS 193*  --   --   BILITOT 2.7*  --   --    Hematology Recent Labs  Lab 09/10/18 0419 09/10/18 1159 09/11/18 1125  WBC 4.7 4.8 4.8  RBC 2.81* 3.01* 2.74*  HGB 8.2* 8.8* 8.0*  HCT 26.4* 28.5* 25.7*  MCV 94.0 94.7 93.8  MCH 29.2 29.2 29.2  MCHC 31.1 30.9 31.1  RDW 15.9* 15.9* 16.1*  PLT 179 198 179   Cardiac EnzymesNo results for input(s): TROPONINI in the last 168 hours. No results for input(s): TROPIPOC in the last 168 hours.  BNPNo results for input(s): BNP, PROBNP in the last 168 hours.  DDimer No results for input(s): DDIMER in the last 168 hours.  Radiology/Studies:  No results found.  Assessment and Plan:   1. Persistent atrial fibrillation with RVR: patient has had intermittent afib RVR this admission. Coumadin held x2 days for acute GI  bleed, resumed 09/10/2018. He follows with Roderic Palau, NP in the atrial fibrillation clinic and has been on amiodarone 200mg  daily and metoprolol tartrate 25mg  BID for rhythm/rate control. Hypotension has limited rate control options. Suspect RVR is being driven by infection/ acute on chronic anemia, hopeful this will improve as illness/anemia improves.  - Continue coumadin for CHA2DS2-VASc Score of 4 (CHF, HTN, DM type 2, Vascular).  - Continue metoprolol 12.5mg  BID for now - uptitrate to home dose as BP will tolerate - Continue amiodarone to 200mg  daily - Given friable  gastric mucosa on EGD, favor waiting for recovery from GI bleed in an effort to avoid TEE prior to DCCV. Will schedule patient to be seen by Roderic Palau in the atrial fibrillation clinic next week for close follow-up and consideration for DCCV in 3 weeks if patient remains on therapeutic anticoagulation.   2. Chronic combined CHF/NICM: last echo 02/2018 with EF 25-30%, mild LVH, and mildly dilated LA. He has been receiving IVF for management of sepsis and hypotension, currently net +1.8L this admission.  - Continue metoprolol - Continue to monitor volume status closely   3. Sepsis: patient presented with fever, tachycardia, and hypotension. CT A/P with c/f enlarged small bowel, mesenteric, and retroperitoneal lymph nodes c/w infection vs inflammation. He has been maintained on IV antibiotics.  - Continue management per primary team  4. GI bleed: patient had an episode of BRBPR on presentation to the ED. GI performed EGD/Cscope which did not show active bleeding but several areas of friable mucosa in the stomach and colon. Cleared to restart anticoagulation.  - Continue coumadin per pharmacy.    For questions or updates, please contact [place] Please consult [email or web address] for contact info under Cardiology/STEMI.   Signed, Abigail Butts, PA-C  09/11/2018 1:14 PM [phone]   ---------------------------------------------------------------------------------------------   History and all data above reviewed.  Patient examined.  I agree with the findings as above.  Jayko Voorhees is a 48 yo male on whom we were consulted for management of afib RVR. He has a history of afib and was planned for cardioversion today as an outpatient, however was admitted to the hospital prior to this for sepsis and GI bleed.   Constitutional: No acute distress Eyes: pupils equally round and reactive to light, sclera non-icteric, normal conjunctiva and lids ENMT: normal dentition, moist mucous  membranes Cardiovascular: irregular rhythm, tachycardic, no murmurs. S1 and S2 normal. Radial pulses normal bilaterally. No jugular venous distention.  Respiratory: clear to auscultation bilaterally GI : normal bowel sounds, soft and nontender. No distention.   MSK: extremities warm, well perfused. No edema.  NEURO: grossly nonfocal exam, moves all extremities. PSYCH: alert and oriented x 3, normal mood and affect.   All available labs, radiology testing, previous records reviewed. Agree with documented assessment and plan of my colleague as stated above with the following additions or changes:  Principal Problem:   Severe sepsis ([place]) Active Problems:   Type 2 diabetes mellitus ([place])   Automatic implantable cardioverter-defibrillator in situ   Persistent atrial fibrillation   Rectal bleed   Acute blood loss anemia   Hypotension    Plan: Understandably the patient is frustrated about cardioversion, however in the setting of blood loss anemia and infection, rapid atrial fibrillation is not unexpected. Cardioversion may not be successful at this time given underlying driving factors require continued management. In addition, there is evidence of friable gastric muscosa in the setting of recent GI bleed. This is at least a relative  contraindication to passing a TEE probe, which would be the preferred method of performing DCCV since there were aberrations in INR and brief cessation of AC due to bleeding.   Continue medical therapy for afib including metoprolol and amiodarone. Supportive care for sepsis. Can continue warfarin for [facility] per GI. Volume status should be closely monitored given reduced EF.   Length of Stay:  LOS: 5 days   Elouise Munroe, MD [facility]  09/11/2018

## 2018-09-11 NOTE — Progress Notes (Addendum)
[facility] Progress Note   Subjective: Finished HD early this AM. Wants to go home for July 4th. No C/Os. No further rectal bleeding.   Objective Vitals:   09/11/18 0500 09/11/18 0530 09/11/18 0545 09/11/18 0840  BP: 110/78 (!) 104/58 108/66   Pulse: (!) 122 (!) 112 (!) 115   Resp: 16 17 18    Temp:   98.4 F (36.9 C) 98.3 F (36.8 C)  TempSrc:   Oral Oral  SpO2:   95%   Weight:   94.5 kg      Physical Exam General:Chronically ill appearing male in NAD Heart:regularly irreg rate 90s. Lungs:Bilateral breath sounds decreased in bases. Abdomen:Active BS Extremities:trace BLE edema Dialysis Access:RIJ TDC drsg intact. Maturing L AVF+ bruit   Additional Objective Labs: Basic Metabolic Panel: Recent Labs  Lab 09/08/18 0457 09/09/18 0403 09/10/18 0419  NA 136 137 139  K 3.8 3.5 3.3*  CL 103 98 101  CO2 19* 23 24  GLUCOSE 106* 111* 87  BUN 43* 24* 31*  CREATININE 11.59* 7.87* 10.28*  CALCIUM 9.2 9.0 9.2  PHOS 6.3*  --   --    Liver Function Tests: Recent Labs  Lab 09/07/18 0819 09/08/18 0457  AST 37  --   ALT 38  --   ALKPHOS 193*  --   BILITOT 2.7*  --   PROT 7.5  --   ALBUMIN 3.0* 2.6*   No results for input(s): LIPASE, AMYLASE in the last 168 hours. CBC: Recent Labs  Lab 09/07/18 0819  09/08/18 0457 09/09/18 0403 09/10/18 0419 09/10/18 1159  WBC 15.9*  --  6.3 5.0 4.7 4.8  NEUTROABS 14.9*  --   --  3.3 2.9  --   HGB 9.2*   < > 8.1* 7.9* 8.2* 8.8*  HCT 30.7*   < > 26.3* 25.1* 26.4* 28.5*  MCV 98.1  --  96.0 94.0 94.0 94.7  PLT 288  --  212 183 179 198   < > = values in this interval not displayed.   Blood Culture    Component Value Date/Time   SDES BLOOD RIGHT HAND 09/07/2018 0854   SPECREQUEST  09/07/2018 0854    BOTTLES DRAWN AEROBIC AND ANAEROBIC Blood Culture adequate volume   CULT  09/07/2018 0854    NO GROWTH 4 DAYS Performed at [facility], [place] [address] [place], [place] [address]    REPTSTATUS  PENDING 09/07/2018 [id number]    Cardiac Enzymes: No results for input(s): CKTOTAL, CKMB, CKMBINDEX, TROPONINI in the last 168 hours. CBG: Recent Labs  Lab 09/10/18 0841 09/10/18 0917 09/10/18 1808 09/10/18 2209 09/11/18 0838  GLUCAP 67* 95 104* 122* 109*   Iron Studies: No results for input(s): IRON, TIBC, TRANSFERRIN, FERRITIN in the last 72 hours. @lablastinr3 @ Studies/Results: No results found. Medications: . cefTRIAXone (ROCEPHIN)  IV 2 g (09/11/18 0937)  . metronidazole 500 mg (09/11/18 0654)   . amiodarone  200 mg Oral Daily  . atorvastatin  40 mg Oral q1800  . Chlorhexidine Gluconate Cloth  6 each Topical Q0600  . darbepoetin (ARANESP) injection - DIALYSIS  100 mcg Intravenous Q Tue-HD  . doxercalciferol  1 mcg Intravenous Q T,Th,Sa-HD  . hydrocortisone  25 mg Rectal BID  . insulin aspart  0-9 Units Subcutaneous TID WC  . insulin glargine  8 Units Subcutaneous QHS  . metoprolol tartrate  12.5 mg Oral BID  . multivitamin  1 tablet Oral QHS  . sevelamer carbonate  2,400 mg Oral TID  WC  . sodium chloride flush  3 mL Intravenous Q12H  . Warfarin - Pharmacist Dosing Inpatient   Does not apply q1800    Dialysis Orders: TTS [facility]  4 hr 180 NRe 400/800  94 kg 3 K/2.25 Ca  -Heparin 4000 units IV TIW -Hectorol 1 mcg IV TIW  -Aranesp 100 mcg IV weekly  Assessment/Plan: 1. Sepsis -Fever withWBC to 15.9 with ^ N from WBC 6/25 of 6.4 with normal diff- rather acute onset.WBC down to 6.3 near baselineNegativeCOVID/CXR, recent infected ext hemorrhoid, CT showed numerous small bowel mesenteric and retroperitoneal nodes, unintentional weight loss 5 kg this month after several months of very stable weights worrisome. Trend T bili.2.7 upon admission With normal LFTsAntibiotics per primary 2. Rectal bleeding-went for colonoscopy and endoscopy today. No acute source of bleeding found.  3. ESRD- TTS -Finished HD early this AM. Next HD 09/12/18.  4. Hypertension/volume- HD  07/01 Pre wt 95 kg Net UF 500cc Post wt 94.5 kg. Close to OP EDW. BP higher, looks better but HR also down to 90s. 0.5-1 liter UFG tomorrow.  5. Anemia- ^ 8.8.  Aranesp 60 mcg IV 09/08/18. Follow HGB.  6. Metabolic bone disease- Not on VDRA. Continue binders.  7. Nutrition- renal diet/vits/supplements 8. PAF on chronic coumadin -MTP increased this month to 25 bid due to afib with RVR during ED visit 6/10; Was scheduled cardioversion for 09/11/18 but was off coumadin. Cardioversion appears to be on hold. Coumadin resumed 09/10/18.  9. DM -Per primary  Travis Jacob H. Kiegan Macaraeg NP-C 09/11/2018, 11:08 AM  [employer] [phone]

## 2018-09-12 DIAGNOSIS — Z992 Dependence on renal dialysis: Secondary | ICD-10-CM

## 2018-09-12 DIAGNOSIS — N186 End stage renal disease: Secondary | ICD-10-CM

## 2018-09-12 LAB — CBC WITH DIFFERENTIAL/PLATELET
Abs Immature Granulocytes: 0.01 10*3/uL (ref 0.00–0.07)
Basophils Absolute: 0 10*3/uL (ref 0.0–0.1)
Basophils Relative: 0 %
Eosinophils Absolute: 0.2 10*3/uL (ref 0.0–0.5)
Eosinophils Relative: 4 %
HCT: 26.4 % — ABNORMAL LOW (ref 39.0–52.0)
Hemoglobin: 8.1 g/dL — ABNORMAL LOW (ref 13.0–17.0)
Immature Granulocytes: 0 %
Lymphocytes Relative: 24 %
Lymphs Abs: 1.1 10*3/uL (ref 0.7–4.0)
MCH: 29.2 pg (ref 26.0–34.0)
MCHC: 30.7 g/dL (ref 30.0–36.0)
MCV: 95.3 fL (ref 80.0–100.0)
Monocytes Absolute: 0.5 10*3/uL (ref 0.1–1.0)
Monocytes Relative: 10 %
Neutro Abs: 2.8 10*3/uL (ref 1.7–7.7)
Neutrophils Relative %: 62 %
Platelets: 174 10*3/uL (ref 150–400)
RBC: 2.77 MIL/uL — ABNORMAL LOW (ref 4.22–5.81)
RDW: 16.2 % — ABNORMAL HIGH (ref 11.5–15.5)
WBC: 4.5 10*3/uL (ref 4.0–10.5)
nRBC: 0.7 % — ABNORMAL HIGH (ref 0.0–0.2)

## 2018-09-12 LAB — CULTURE, BLOOD (ROUTINE X 2)
Culture: NO GROWTH
Culture: NO GROWTH
Special Requests: ADEQUATE
Special Requests: ADEQUATE

## 2018-09-12 LAB — BASIC METABOLIC PANEL
Anion gap: 13 (ref 5–15)
BUN: 32 mg/dL — ABNORMAL HIGH (ref 6–20)
CO2: 22 mmol/L (ref 22–32)
Calcium: 9 mg/dL (ref 8.9–10.3)
Chloride: 104 mmol/L (ref 98–111)
Creatinine, Ser: 10.77 mg/dL — ABNORMAL HIGH (ref 0.61–1.24)
GFR calc Af Amer: 6 mL/min — ABNORMAL LOW (ref >60)
GFR calc non Af Amer: 5 mL/min — ABNORMAL LOW (ref >60)
Glucose, Bld: 91 mg/dL (ref 70–99)
Potassium: 3.3 mmol/L — ABNORMAL LOW (ref 3.5–5.1)
Sodium: 139 mmol/L (ref 135–145)

## 2018-09-12 LAB — IRON AND TIBC
Iron: 51 ug/dL (ref 45–182)
Saturation Ratios: 31 % (ref 17.9–39.5)
TIBC: 167 ug/dL — ABNORMAL LOW (ref 250–450)
UIBC: 116 ug/dL

## 2018-09-12 LAB — FERRITIN: Ferritin: 1245 ng/mL — ABNORMAL HIGH (ref 24–336)

## 2018-09-12 LAB — PROTIME-INR
INR: 3.1 — ABNORMAL HIGH (ref 0.8–1.2)
Prothrombin Time: 31.4 seconds — ABNORMAL HIGH (ref 11.4–15.2)

## 2018-09-12 LAB — GLUCOSE, CAPILLARY: Glucose-Capillary: 86 mg/dL (ref 70–99)

## 2018-09-12 MED ORDER — PENTAFLUOROPROP-TETRAFLUOROETH EX AERO: 1.0000 "application " | INHALATION_SPRAY | CUTANEOUS | Status: DC | PRN

## 2018-09-12 MED ORDER — WARFARIN SODIUM 5 MG PO TABS: 5.0000 mg | ORAL_TABLET | Freq: Every day | ORAL | 0 refills | Status: DC

## 2018-09-12 MED ORDER — HEPARIN SODIUM (PORCINE) 1000 UNIT/ML DIALYSIS
4000.0000 [IU] | Freq: Once | INTRAMUSCULAR | Status: AC
  Administered 2018-09-12: 4000 [IU] via INTRAVENOUS_CENTRAL

## 2018-09-12 MED ORDER — ALTEPLASE 2 MG IJ SOLR: 2.0000 mg | Freq: Once | INTRAMUSCULAR | Status: DC | PRN

## 2018-09-12 MED ORDER — HEPARIN SODIUM (PORCINE) 1000 UNIT/ML DIALYSIS
1000.0000 [IU] | INTRAMUSCULAR | Status: DC | PRN
  Administered 2018-09-12: 3400 [IU] via INTRAVENOUS_CENTRAL
  Filled 2018-09-12: qty 1

## 2018-09-12 MED ORDER — HEPARIN SODIUM (PORCINE) 1000 UNIT/ML IJ SOLN
INTRAMUSCULAR | Status: AC
  Filled 2018-09-12: qty 4

## 2018-09-12 MED ORDER — SODIUM CHLORIDE 0.9 % IV SOLN: 100.0000 mL | INTRAVENOUS | Status: DC | PRN

## 2018-09-12 MED ORDER — HYDROCODONE-ACETAMINOPHEN 5-325 MG PO TABS
2.0000 | ORAL_TABLET | Freq: Once | ORAL | Status: AC
  Administered 2018-09-12: 2 via ORAL
  Filled 2018-09-12: qty 2

## 2018-09-12 MED ORDER — METOPROLOL TARTRATE 25 MG PO TABS: 12.5000 mg | ORAL_TABLET | Freq: Two times a day (BID) | ORAL | Status: DC

## 2018-09-12 MED ORDER — ONDANSETRON HCL 4 MG PO TABS: 4.0000 mg | ORAL_TABLET | Freq: Four times a day (QID) | ORAL | 0 refills | Status: DC | PRN

## 2018-09-12 MED ORDER — LIDOCAINE-PRILOCAINE 2.5-2.5 % EX CREA: 1.0000 "application " | TOPICAL_CREAM | CUTANEOUS | Status: DC | PRN

## 2018-09-12 MED ORDER — HYDROCORTISONE ACETATE 25 MG RE SUPP: 25.0000 mg | Freq: Two times a day (BID) | RECTAL | 0 refills | Status: DC

## 2018-09-12 MED ORDER — INSULIN GLARGINE 100 UNIT/ML ~~LOC~~ SOLN: 8.0000 [IU] | Freq: Every evening | SUBCUTANEOUS | 5 refills | Status: DC | PRN

## 2018-09-12 MED ORDER — DOXERCALCIFEROL 4 MCG/2ML IV SOLN
INTRAVENOUS | Status: AC
  Filled 2018-09-12: qty 2

## 2018-09-12 MED ORDER — LIDOCAINE HCL (PF) 1 % IJ SOLN: 5.0000 mL | INTRAMUSCULAR | Status: DC | PRN

## 2018-09-12 MED ORDER — SEVELAMER CARBONATE 800 MG PO TABS: 2400.0000 mg | ORAL_TABLET | Freq: Three times a day (TID) | ORAL | 2 refills | Status: DC

## 2018-09-12 MED FILL — ONDANSETRON HCL 4 MG TABLET: 4 | 5 days supply | Qty: 20 | Fill #0

## 2018-09-12 MED FILL — HYDROCORTISONE ACETATE 25 M: 25 | 6 days supply | Qty: 12 | Fill #0

## 2018-09-12 NOTE — Progress Notes (Signed)
Pt requested relief for internal hemorrhoid pain. Refused hydrocortisone suppository. Educated on purpose. Requested oral meds. Dr. paged, will continue to monitor.

## 2018-09-12 NOTE — Discharge Summary (Signed)
Physician Discharge Summary  Travis Bryan XBL:[id number] DOB: [birth year] DOA: 09/07/2018  PCP: [name], FNP  Admit date: 09/07/2018 Discharge date: 09/12/2018  Admitted From: Home Disposition: Home  Recommendations for Outpatient Follow-up:  1. Follow up with PCP in 1 week with repeat INR 2. Follow-up with Coumadin clinic with repeat INR.  Coumadin can be dosed by Coumadin clinic as per PT/INR levels. 3. Outpatient follow-up with cardiology.  Follow-up with outpatient hemodialysis as scheduled. 4. Follow up in ED if symptoms worsen or new appear   Home Health: No Equipment/Devices: None  Discharge Condition: Stable CODE STATUS: Full Diet recommendation: Heart healthy/carb modified/renal hemodialysis diet  Brief/Interim Summary: 48 year old male with history of hypertension, hyperlipidemia, end-stage renal disease on hemodialysis, A. fib, systolic CHF, status post AICD and gout presented with shaking and dry cough.  On presentation, he was found to be febrile up to 102.2 with A. fib with RVR.  Chest x-ray was clear.  He was started on broad-spectrum antibiotics.  CT of the abdomen and pelvis showed numerous enlarged small bowel mesenteric and retroperitoneal lymph nodes, possibly reactive, but lymphoma must be considered in the differential.  Liver contours are consistent with cirrhosis.  Initial COVID-19 testing was negative. He was started on broad-spectrum antibiotics.  Coumadin was held.  GI was consulted for complaints of rectal bleeding.  He had EGD and colonoscopy performed during the hospitalization which showed esophageal dysmotility but no evidence of varices in the esophagus and colonoscopy showed a polyp in the transverse colon which is removed and internal hemorrhoids were found.  Patient was supposed to have elective cardioversion which was canceled the subsequent hospitalization.  Cardiology was consulted.  His metoprolol has been decreased.  Cardiology has arranged  for outpatient follow-up next week.  Coumadin has been restarted.  He has been afebrile.  Cultures have been negative.  Today is day 6 of antibiotics.  He will be discharged home today off antibiotics.  Discharge Diagnoses:  Severe sepsis -Presented with fever, leukocytosis with elevated lactic acid levels.  CT of the abdomen had shown multiple enlarged small bowel mesenteric and retroperitoneal lymph nodes concerning for infectious or inflammatory process.  Patient will need repeat CT scan as an outpatient -He was started on broad-spectrum antibiotics.  Rocephin and Flagyl have been continued.  Vancomycin was stopped.  Cultures have been negative so far.  Today is day #6 of antibiotics.  Afebrile and hemodynamically stable.  Leukocytosis is resolved. - We will discharge home off antibiotics today.  Rectal bleeding/rectal pain Chronic anemia of chronic disease Cirrhosis of liver based on recent CT scan of the abdomen -Status post EGD and colonoscopy: EGD showed no varices.  Friable gastric mucosa but otherwise no active bleeding.  Colonoscopy also showed scattered areas of friable mucosa and large internal hemorrhoids.  Small transverse colon polyp removed with biopsy forceps.  No active bleeding.  Fair prep.  GI recommended Anusol HC twice a day for 2 weeks.  Anticoagulation was resumed on 09/10/2018 after GI cleared the patient for the same. -Hemoglobin currently stable.  Outpatient follow-up with GI  Persistent atrial fibrillation Supratherapeutic INR -Patient is still tachycardic.  Cardiology evaluation appreciated.  Patient was supposed to have elective cardioversion done which was postponed because of current hospitalization.  Cardiology is recommending outpatient follow-up next week.  Continue amiodarone and dose of metoprolol was decreased to 12.5 mg twice daily because of hypotension.  This probably can be increased as an outpatient if blood pressure allows. -INR 3.1 today.  Hold Coumadin  dosage.  Restart Coumadin tomorrow at 5 mg daily and follow-up INR as an outpatient which can be followed up at Coumadin clinic and Coumadin dose can be adjusted accordingly  End-stage renal disease on hemodialysis -Patient had dialysis today.  Outpatient follow-up with outpatient dialysis as scheduled  Chronic systolic congestive heart failure status post AICD -Last EF noted to be 25 to 30% by echo in 02/2018 -Volume managed by dialysis.  Outpatient follow-up with cardiology  Diabetes mellitus type 2 Episodes of hypoglycemia -Blood sugars on the lower side.  Discharged home on Lantus 8 units daily.  Outpatient follow-up   Discharge Instructions  Discharge Instructions    Diet - low sodium heart healthy   Complete by: As directed    Diet Carb Modified   Complete by: As directed    Increase activity slowly   Complete by: As directed      Allergies as of 09/12/2018      Reactions   No Known Allergies       Medication List    STOP taking these medications   torsemide 20 MG tablet Commonly known as: DEMADEX     TAKE these medications   acetaminophen 500 MG tablet Commonly known as: TYLENOL Take 1,000 mg by mouth every 6 (six) hours as needed for mild pain or headache.   amiodarone 200 MG tablet Commonly known as: PACERONE Take 1 tablet (200 mg total) by mouth daily.   atorvastatin 40 MG tablet Commonly known as: LIPITOR Take 1 tablet (40 mg total) by mouth daily at 6 PM.   hydrocortisone 25 MG suppository Commonly known as: ANUSOL-HC Place 1 suppository (25 mg total) rectally 2 (two) times daily.   insulin aspart 100 UNIT/ML injection Commonly known as: NovoLOG Use 3 units three times a day with meals What changed:   how much to take  how to take this  when to take this  additional instructions   insulin glargine 100 UNIT/ML injection Commonly known as: Lantus Inject 0.08 mLs (8 Units total) into the skin at bedtime as needed. What changed: how much to  take   Insulin Syringe-Needle U-100 31G X 15/64" 0.3 ML Misc Use to inject insulin 4 times a day   metoprolol tartrate 25 MG tablet Commonly known as: LOPRESSOR Take 0.5 tablets (12.5 mg total) by mouth 2 (two) times daily. What changed: how much to take   multivitamin Tabs tablet TAKE 1 TABLET BY MOUTH AT BEDTIME.   ondansetron 4 MG tablet Commonly known as: ZOFRAN Take 1 tablet (4 mg total) by mouth every 6 (six) hours as needed for nausea.   sevelamer carbonate 800 MG tablet Commonly known as: RENVELA Take 3 tablets (2,400 mg total) by mouth 3 (three) times daily with meals. What changed: how much to take   warfarin 5 MG tablet Commonly known as: COUMADIN Take as directed. If you are unsure how to take this medication, talk to your nurse or doctor. Original instructions: Take 1 tablet (5 mg total) by mouth daily at 6 PM. Hold Coumadin dose on 09/12/2018.  Start on 09/13/2018. Start taking on: September 13, 2018 What changed:   how much to take  how to take this  when to take this  additional instructions      Follow-up Information    Brahmbhatt, Parag, MD. Schedule an appointment as soon as possible for a visit in 2 month(s).   Specialty: Gastroenterology Why: Cirrhosis of the liver Contact information: [address]  [address] [place] [address] [phone]        Malka So R, PA Follow up on 09/17/2018.   Specialty: Cardiology Why: at 1:30 P for a fib clinic Contact information: [place] [address] [phone]        [name], [place]. Schedule an appointment as soon as possible for a visit in 1 week(s).   Specialty: Family Medicine Contact information: [place] [address] [phone]        Larey Dresser, MD .   Specialty: Cardiology Contact information: [phone] [place] [address] [phone]        Evans Lance, MD .   Specialty: Cardiology Contact  information: [phone] [place] [address] [phone]        outpatient hemodialysis Follow up.   Why: as scheduled         Allergies  Allergen Reactions  . No Known Allergies     Consultations:  GI/cardiology/nephrology   Procedures/Studies: Ct Abdomen Pelvis Wo Contrast  Result Date: 09/07/2018 CLINICAL DATA:  Sepsis, evaluate for intra-abdominal origin or abscess EXAM: CT ABDOMEN AND PELVIS WITHOUT CONTRAST TECHNIQUE: Multidetector CT imaging of the abdomen and pelvis was performed following the standard protocol without IV contrast. COMPARISON:  04/27/2018 FINDINGS: Lower chest: No acute abnormality.  Cardiomegaly. Hepatobiliary: Coarse contour of the liver. Gallstones in the dependent gallbladder. No gallbladder wall thickening, or biliary dilatation. Pancreas: Unremarkable. No pancreatic ductal dilatation or surrounding inflammatory changes. Spleen: Normal in size without significant abnormality. Adrenals/Urinary Tract: No change in a 1.8 cm soft tissue attenuation nodule of the left adrenal gland (series 3, image 27). Kidneys are normal, without renal calculi, solid lesion, or hydronephrosis. Bladder is unremarkable. Stomach/Bowel: Stomach is within normal limits. Appendix appears normal. There is extensive small mesenteric fat stranding and numerous enlarged mesenteric lymph nodes in the central abdomen (series 3, image 43). Vascular/Lymphatic: No significant vascular findings are present. Numerous enlarged retroperitoneal lymph nodes, increased in size compared to prior examination, measuring up to 1.9 x 1.2 cm (series 3, image 44). Reproductive: No mass or other significant abnormality. Other: No abdominal wall hernia or abnormality.  Trace ascites. Musculoskeletal: No acute or significant osseous findings. IMPRESSION: 1. Numerous enlarged small bowel mesenteric and retroperitoneal lymph nodes, new compared to prior examination. These may be reactive,  for example secondary to nonspecific infectious or inflammatory enteritis, however malignancy such as lymphoma is a differential consideration. Recommend CT follow-up in 3 months to ensure stability or resolution. These may be characterized for metabolic activity by PET-CT if indicated by clinical concern for malignancy. 2.  Coarse contour of the liver, suggesting cirrhosis. 3.  Gallstones. 4.  Trace, nonspecific ascites. Electronically Signed   By: Eddie Candle M.D.   On: 09/07/2018 12:02   Dg Chest 2 View  Result Date: 08/20/2018 CLINICAL DATA:  Shortness of breath EXAM: CHEST - 2 VIEW COMPARISON:  08/16/2018, 04/25/2018, 04/18/2018, 04/01/2016 FINDINGS: No pleural effusion. Stable cardiomegaly. Left-sided pacing device as before. Right-sided central venous catheter tip over the proximal right atrium. Faint interstitial opacity at the right greater than left bases appear chronic. No pneumothorax. IMPRESSION: No active cardiopulmonary disease.  Cardiomegaly. Electronically Signed   By: Donavan Foil M.D.   On: 08/20/2018 23:18   Dg Chest 2 View  Result Date: 08/16/2018 CLINICAL DATA:  Shortness of breath. EXAM: CHEST - 2 VIEW COMPARISON:  04/25/2018. FINDINGS: Right jugular catheter tip  in the upper right atrium. Left subclavian AICD lead tip at the right ventricular apex. Stable enlarged cardiac silhouette. Diffusely prominent interstitial markings with no significant change. No pleural fluid. Unremarkable bones. IMPRESSION: No acute abnormality. Stable cardiomegaly and chronic interstitial lung disease. Electronically Signed   By: Claudie Revering M.D.   On: 08/16/2018 15:09   Dg Chest Port 1 View  Result Date: 09/07/2018 CLINICAL DATA:  Sepsis, cough. EXAM: PORTABLE CHEST 1 VIEW COMPARISON:  Chest x-ray dated 08/20/2018. FINDINGS: Stable cardiomegaly. LEFT chest wall pacemaker/ICD apparatus appears stable in position. RIGHT-sided central catheter appears stable in position with tip overlying the RIGHT  atrium. Lungs are clear. No pleural effusion or pneumothorax seen. Osseous structures about the chest are unremarkable. IMPRESSION: 1. No active disease. No evidence of pneumonia or pulmonary edema. 2. Stable cardiomegaly. Electronically Signed   By: Franki Cabot M.D.   On: 09/07/2018 08:58    -EGD showed no gross varices.  Friable gastric mucosa but otherwise no active bleeding. -Colonoscopy also showed scattered areas of friable mucosa and large internal hemorrhoids.  Small transverse colon polyp removed with biopsy forcep.  No active bleeding.  Fair prep    Subjective: Patient seen and examined at bedside.  She thinks that she should be able to go home today.  No overnight fever, nausea or vomiting.  No overnight rectal bleeding.  No chest pain or palpitations. Discharge Exam: Vitals:   09/12/18 0930 09/12/18 1004  BP: 106/81 118/78  Pulse: (!) 104 100  Resp:  20  Temp:  98.6 F (37 C)  SpO2:  93%    General: Pt is alert, awake, not in acute distress.  Poor historian Cardiovascular: Intermittently tachycardic, S1/S2 + Respiratory: bilateral decreased breath sounds at bases, scattered crackles Abdominal: Soft, NT, ND, bowel sounds + Extremities: no edema, no cyanosis    The results of significant diagnostics from this hospitalization (including imaging, microbiology, ancillary and laboratory) are listed below for reference.     Microbiology: Recent Results (from the past 240 hour(s))  Blood Culture (routine x 2)     Status: None   Collection Time: 09/07/18  8:19 AM   Specimen: BLOOD  Result Value Ref Range Status   Specimen Description BLOOD RIGHT ANTECUBITAL  Final   Special Requests   Final    BOTTLES DRAWN AEROBIC AND ANAEROBIC Blood Culture adequate volume   Culture   Final    NO GROWTH 5 DAYS Performed at [facility], [address] [place], [place] [address]    Report Status 09/12/2018 FINAL  Final  Blood Culture (routine x 2)     Status: None    Collection Time: 09/07/18  8:54 AM   Specimen: BLOOD RIGHT HAND  Result Value Ref Range Status   Specimen Description BLOOD RIGHT HAND  Final   Special Requests   Final    BOTTLES DRAWN AEROBIC AND ANAEROBIC Blood Culture adequate volume   Culture   Final    NO GROWTH 5 DAYS Performed at [facility], [place] [address], [place], [place] [address]    Report Status 09/12/2018 FINAL  Final  SARS Coronavirus 2 (CEPHEID - Performed in [place] hospital lab), Hosp Order     Status: None   Collection Time: 09/07/18  9:07 AM   Specimen: Nasopharyngeal Swab  Result Value Ref Range Status   SARS Coronavirus 2 NEGATIVE NEGATIVE Final    Comment: (NOTE) If result is NEGATIVE SARS-CoV-2 target nucleic acids are NOT DETECTED.  The SARS-CoV-2 RNA is generally detectable in upper and lower  respiratory specimens during the acute phase of infection. The lowest  concentration of SARS-CoV-2 viral copies this assay can detect is 250  copies / mL. A negative result does not preclude SARS-CoV-2 infection  and should not be used as the sole basis for treatment or other  patient management decisions.  A negative result may occur with  improper specimen collection / handling, submission of specimen other  than nasopharyngeal swab, presence of viral mutation(s) within the  areas targeted by this assay, and inadequate number of viral copies  (<250 copies / mL). A negative result must be combined with clinical  observations, patient history, and epidemiological information. If result is POSITIVE SARS-CoV-2 target nucleic acids are DETECTED. The SARS-CoV-2 RNA is generally detectable in upper and lower  respiratory specimens dur ing the acute phase of infection.  Positive  results are indicative of active infection with SARS-CoV-2.  Clinical  correlation with patient history and other diagnostic information is  necessary to determine patient infection status.  Positive results do  not rule out  bacterial infection or co-infection with other viruses. If result is PRESUMPTIVE POSTIVE SARS-CoV-2 nucleic acids MAY BE PRESENT.   A presumptive positive result was obtained on the submitted specimen  and confirmed on repeat testing.  While 2019 novel coronavirus  (SARS-CoV-2) nucleic acids may be present in the submitted sample  additional confirmatory testing may be necessary for epidemiological  and / or clinical management purposes  to differentiate between  SARS-CoV-2 and other Sarbecovirus currently known to infect humans.  If clinically indicated additional testing with an alternate test  methodology [phone]) is advised. The SARS-CoV-2 RNA is generally  detectable in upper and lower respiratory sp ecimens during the acute  phase of infection. The expected result is Negative. Fact Sheet for Patients:  StrictlyIdeas.no Fact Sheet for Healthcare Providers: BankingDealers.co.za This test is not yet approved or cleared by the [place] FDA and has been authorized for detection and/or diagnosis of SARS-CoV-2 by FDA under an Emergency Use Authorization (EUA).  This EUA will remain in effect (meaning this test can be used) for the duration of the COVID-19 declaration under Section 564(b)(1) of the Act, 21 U.S.C. section 360bbb-3(b)(1), unless the authorization is terminated or revoked sooner. Performed at [facility], [place] [address], [place], [place] [address]   Surgical pcr screen     Status: None   Collection Time: 09/07/18  3:36 PM   Specimen: Nasal Mucosa; Nasal Swab  Result Value Ref Range Status   MRSA, PCR NEGATIVE NEGATIVE Final   Staphylococcus aureus NEGATIVE NEGATIVE Final    Comment: (NOTE) The Xpert SA Assay (FDA approved for NASAL specimens in patients 63 years of age and older), is one component of a comprehensive surveillance program. It is not intended to diagnose infection nor to guide or monitor  treatment. Performed at [facility], [place] [address], [place], [place] [address]   Gastrointestinal Panel by PCR , Stool     Status: None   Collection Time: 09/08/18  8:31 PM   Specimen: Stool  Result Value Ref Range Status   Campylobacter species NOT DETECTED NOT DETECTED Final   Plesimonas shigelloides NOT DETECTED NOT DETECTED Final   Salmonella species NOT DETECTED NOT DETECTED Final   Yersinia enterocolitica NOT DETECTED NOT DETECTED Final   Vibrio species NOT DETECTED NOT DETECTED Final   Vibrio cholerae NOT DETECTED NOT DETECTED Final   Enteroaggregative  E coli (EAEC) NOT DETECTED NOT DETECTED Final   Enteropathogenic E coli (EPEC) NOT DETECTED NOT DETECTED Final   Enterotoxigenic E coli (ETEC) NOT DETECTED NOT DETECTED Final   Shiga like toxin producing E coli (STEC) NOT DETECTED NOT DETECTED Final   Shigella/Enteroinvasive E coli (EIEC) NOT DETECTED NOT DETECTED Final   Cryptosporidium NOT DETECTED NOT DETECTED Final   Cyclospora cayetanensis NOT DETECTED NOT DETECTED Final   Entamoeba histolytica NOT DETECTED NOT DETECTED Final   Giardia lamblia NOT DETECTED NOT DETECTED Final   Adenovirus F40/41 NOT DETECTED NOT DETECTED Final   Astrovirus NOT DETECTED NOT DETECTED Final   Norovirus GI/GII NOT DETECTED NOT DETECTED Final   Rotavirus A NOT DETECTED NOT DETECTED Final   Sapovirus (I, II, IV, and V) NOT DETECTED NOT DETECTED Final    Comment: Performed at [facility], [address] [place], [place] [address]     Labs: BNP (last 3 results) Recent Labs    02/25/18 1235 03/28/18 1040 08/16/18 1447  BNP 207.2* 683.2* 4,008.6*   Basic Metabolic Panel: Recent Labs  Lab 09/08/18 0457 09/09/18 0403 09/10/18 0419 09/11/18 1125 09/12/18 0353  NA 136 137 139 138 139  K 3.8 3.5 3.3* 3.1* 3.3*  CL 103 98 101 100 104  CO2 19* 23 24 23 22   GLUCOSE 106* 111* 87 109* 91  BUN 43* 24* 31* 28* 32*  CREATININE 11.59* 7.87* 10.28* 9.22* 10.77*  CALCIUM 9.2  9.0 9.2 8.8* 9.0  PHOS 6.3*  --   --  4.1  --    Liver Function Tests: Recent Labs  Lab 09/07/18 0819 09/08/18 0457 09/11/18 1125  AST 37  --   --   ALT 38  --   --   ALKPHOS 193*  --   --   BILITOT 2.7*  --   --   PROT 7.5  --   --   ALBUMIN 3.0* 2.6* 2.6*   No results for input(s): LIPASE, AMYLASE in the last 168 hours. No results for input(s): AMMONIA in the last 168 hours. CBC: Recent Labs  Lab 09/07/18 0819  09/09/18 0403 09/10/18 0419 09/10/18 1159 09/11/18 1125 09/12/18 0353  WBC 15.9*   < > 5.0 4.7 4.8 4.8 4.5  NEUTROABS 14.9*  --  3.3 2.9  --   --  2.8  HGB 9.2*   < > 7.9* 8.2* 8.8* 8.0* 8.1*  HCT 30.7*   < > 25.1* 26.4* 28.5* 25.7* 26.4*  MCV 98.1   < > 94.0 94.0 94.7 93.8 95.3  PLT 288   < > 183 179 198 179 174   < > = values in this interval not displayed.   Cardiac Enzymes: No results for input(s): CKTOTAL, CKMB, CKMBINDEX, TROPONINI in the last 168 hours. BNP: Invalid input(s): POCBNP CBG: Recent Labs  Lab 09/11/18 0838 09/11/18 1209 09/11/18 1602 09/11/18 2223 09/12/18 1214  GLUCAP 109* 100* 102* 144* 86   D-Dimer No results for input(s): DDIMER in the last 72 hours. Hgb A1c No results for input(s): HGBA1C in the last 72 hours. Lipid Profile No results for input(s): CHOL, HDL, LDLCALC, TRIG, CHOLHDL, LDLDIRECT in the last 72 hours. Thyroid function studies No results for input(s): TSH, T4TOTAL, T3FREE, THYROIDAB in the last 72 hours.  Invalid input(s): FREET3 Anemia work up Recent Labs    09/12/18 0832  FERRITIN 1,245*  TIBC 167*  IRON 51   Urinalysis    Component Value Date/Time   COLORURINE YELLOW 03/28/2018 1040  APPEARANCEUR CLEAR 03/28/2018 1040   LABSPEC 1.015 03/28/2018 1040   PHURINE 7.0 03/28/2018 1040   GLUCOSEU 50 (A) 03/28/2018 1040   HGBUR NEGATIVE 03/28/2018 1040   [place] 03/28/2018 1040   [place] 03/28/2018 1040   PROTEINUR 100 (A) 03/28/2018 1040   UROBILINOGEN 0.2 12/18/2006 0428    NITRITE NEGATIVE 03/28/2018 1040   LEUKOCYTESUR NEGATIVE 03/28/2018 1040   Sepsis Labs Invalid input(s): PROCALCITONIN,  WBC,  LACTICIDVEN Microbiology Recent Results (from the past 240 hour(s))  Blood Culture (routine x 2)     Status: None   Collection Time: 09/07/18  8:19 AM   Specimen: BLOOD  Result Value Ref Range Status   Specimen Description BLOOD RIGHT ANTECUBITAL  Final   Special Requests   Final    BOTTLES DRAWN AEROBIC AND ANAEROBIC Blood Culture adequate volume   Culture   Final    NO GROWTH 5 DAYS Performed at [facility], [address] [place], [place] [address]    Report Status 09/12/2018 FINAL  Final  Blood Culture (routine x 2)     Status: None   Collection Time: 09/07/18  8:54 AM   Specimen: BLOOD RIGHT HAND  Result Value Ref Range Status   Specimen Description BLOOD RIGHT HAND  Final   Special Requests   Final    BOTTLES DRAWN AEROBIC AND ANAEROBIC Blood Culture adequate volume   Culture   Final    NO GROWTH 5 DAYS Performed at [facility], [place] [address], [place], [place] [address]    Report Status 09/12/2018 FINAL  Final  SARS Coronavirus 2 (CEPHEID - Performed in [place] hospital lab), Hosp Order     Status: None   Collection Time: 09/07/18  9:07 AM   Specimen: Nasopharyngeal Swab  Result Value Ref Range Status   SARS Coronavirus 2 NEGATIVE NEGATIVE Final    Comment: (NOTE) If result is NEGATIVE SARS-CoV-2 target nucleic acids are NOT DETECTED. The SARS-CoV-2 RNA is generally detectable in upper and lower  respiratory specimens during the acute phase of infection. The lowest  concentration of SARS-CoV-2 viral copies this assay can detect is 250  copies / mL. A negative result does not preclude SARS-CoV-2 infection  and should not be used as the sole basis for treatment or other  patient management decisions.  A negative result may occur with  improper specimen collection / handling, submission of specimen other  than  nasopharyngeal swab, presence of viral mutation(s) within the  areas targeted by this assay, and inadequate number of viral copies  (<250 copies / mL). A negative result must be combined with clinical  observations, patient history, and epidemiological information. If result is POSITIVE SARS-CoV-2 target nucleic acids are DETECTED. The SARS-CoV-2 RNA is generally detectable in upper and lower  respiratory specimens dur ing the acute phase of infection.  Positive  results are indicative of active infection with SARS-CoV-2.  Clinical  correlation with patient history and other diagnostic information is  necessary to determine patient infection status.  Positive results do  not rule out bacterial infection or co-infection with other viruses. If result is PRESUMPTIVE POSTIVE SARS-CoV-2 nucleic acids MAY BE PRESENT.   A presumptive positive result was obtained on the submitted specimen  and confirmed on repeat testing.  While 2019 novel coronavirus  (SARS-CoV-2) nucleic acids may be present in the submitted sample  additional confirmatory testing may be necessary for epidemiological  and / or clinical management purposes  to differentiate between  SARS-CoV-2 and other Sarbecovirus currently known to infect humans.  If clinically indicated additional testing with an alternate test  methodology [phone]) is advised. The SARS-CoV-2 RNA is generally  detectable in upper and lower respiratory sp ecimens during the acute  phase of infection. The expected result is Negative. Fact Sheet for Patients:  StrictlyIdeas.no Fact Sheet for Healthcare Providers: BankingDealers.co.za This test is not yet approved or cleared by the [place] FDA and has been authorized for detection and/or diagnosis of SARS-CoV-2 by FDA under an Emergency Use Authorization (EUA).  This EUA will remain in effect (meaning this test can be used) for the duration of  the COVID-19 declaration under Section 564(b)(1) of the Act, 21 U.S.C. section 360bbb-3(b)(1), unless the authorization is terminated or revoked sooner. Performed at [facility], [place] [address], [place], [place] [address]   Surgical pcr screen     Status: None   Collection Time: 09/07/18  3:36 PM   Specimen: Nasal Mucosa; Nasal Swab  Result Value Ref Range Status   MRSA, PCR NEGATIVE NEGATIVE Final   Staphylococcus aureus NEGATIVE NEGATIVE Final    Comment: (NOTE) The Xpert SA Assay (FDA approved for NASAL specimens in patients 56 years of age and older), is one component of a comprehensive surveillance program. It is not intended to diagnose infection nor to guide or monitor treatment. Performed at [facility], [place] [address], [place], [place]  [address]   Gastrointestinal Panel by PCR , Stool     Status: None   Collection Time: 09/08/18  8:31 PM   Specimen: Stool  Result Value Ref Range Status   Campylobacter species NOT DETECTED NOT DETECTED Final   Plesimonas shigelloides NOT DETECTED NOT DETECTED Final   Salmonella species NOT DETECTED NOT DETECTED Final   Yersinia enterocolitica NOT DETECTED NOT DETECTED Final   Vibrio species NOT DETECTED NOT DETECTED Final   Vibrio cholerae NOT DETECTED NOT DETECTED Final   Enteroaggregative E coli (EAEC) NOT DETECTED NOT DETECTED Final   Enteropathogenic E coli (EPEC) NOT DETECTED NOT DETECTED Final   Enterotoxigenic E coli (ETEC) NOT DETECTED NOT DETECTED Final   Shiga like toxin producing E coli (STEC) NOT DETECTED NOT DETECTED Final   Shigella/Enteroinvasive E coli (EIEC) NOT DETECTED NOT DETECTED Final   Cryptosporidium NOT DETECTED NOT DETECTED Final   Cyclospora cayetanensis NOT DETECTED NOT DETECTED Final   Entamoeba histolytica NOT DETECTED NOT DETECTED Final   Giardia lamblia NOT DETECTED NOT DETECTED Final   Adenovirus F40/41 NOT DETECTED NOT DETECTED Final   Astrovirus NOT DETECTED NOT DETECTED Final    Norovirus GI/GII NOT DETECTED NOT DETECTED Final   Rotavirus A NOT DETECTED NOT DETECTED Final   Sapovirus (I, II, IV, and V) NOT DETECTED NOT DETECTED Final    Comment: Performed at [facility], [address], [place], [place] [address]     Time coordinating discharge: 35 minutes  SIGNED:   Aline August, MD  [facility] 09/12/2018, 2:07 PM

## 2018-09-12 NOTE — Progress Notes (Addendum)
Progress Note  Patient Name: Travis Bryan Date of Encounter: 09/12/2018  Primary Cardiologist: Loralie Champagne, MD   Subjective   Wants to go home   Inpatient Medications    Scheduled Meds: . amiodarone  200 mg Oral Daily  . atorvastatin  40 mg Oral q1800  . Chlorhexidine Gluconate Cloth  6 each Topical Q0600  . darbepoetin (ARANESP) injection - DIALYSIS  100 mcg Intravenous Q Tue-HD  . docusate sodium  100 mg Oral BID  . doxercalciferol  1 mcg Intravenous Q T,Th,Sa-HD  . hydrocortisone  25 mg Rectal BID  . insulin aspart  0-9 Units Subcutaneous TID WC  . insulin glargine  8 Units Subcutaneous QHS  . metoprolol tartrate  12.5 mg Oral BID  . multivitamin  1 tablet Oral QHS  . sevelamer carbonate  2,400 mg Oral TID WC  . sodium chloride flush  3 mL Intravenous Q12H  . Warfarin - Pharmacist Dosing Inpatient   Does not apply q1800   Continuous Infusions: . cefTRIAXone (ROCEPHIN)  IV 2 g (09/12/18 1110)  . metronidazole 500 mg (09/11/18 2224)   PRN Meds: acetaminophen **OR** acetaminophen, albuterol, ondansetron **OR** ondansetron (ZOFRAN) IV   Vital Signs    Vitals:   09/12/18 0845 09/12/18 0900 09/12/18 0930 09/12/18 1004  BP: 125/80 105/79 106/81 118/78  Pulse: 82 62 (!) 104 100  Resp:    20  Temp:    98.6 F (37 C)  TempSrc:    Oral  SpO2:    93%  Weight:    93.2 kg    Intake/Output Summary (Last 24 hours) at 09/12/2018 1149 Last data filed at 09/12/2018 1004 Gross per 24 hour  Intake 480 ml  Output 256 ml  Net 224 ml   Last 3 Weights 09/12/2018 09/12/2018 09/11/2018  Weight (lbs) 205 lb 7.5 oz 207 lb 0.2 oz 208 lb 5.4 oz  Weight (kg) 93.2 kg 93.9 kg 94.5 kg      Telemetry    A fib with RVR - Personally Reviewed  ECG    No new - Personally Reviewed  Physical Exam   GEN: No acute distress.   Neck: No JVD Cardiac: irreg irreg no murmurs, rubs, or gallops.  Respiratory: Clear to diminished to auscultation bilaterally. GI: Soft, nontender, non-distended   MS: No edema; No deformity. Neuro:  Nonfocal  Psych: Normal affect   Labs    High Sensitivity Troponin:  No results for input(s): TROPONINIHS in the last 720 hours.    Cardiac EnzymesNo results for input(s): TROPONINI in the last 168 hours. No results for input(s): TROPIPOC in the last 168 hours.   Chemistry Recent Labs  Lab 09/07/18 0819 09/08/18 0457  09/10/18 0419 09/11/18 1125 09/12/18 0353  NA 138 136   < > 139 138 139  K 4.1 3.8   < > 3.3* 3.1* 3.3*  CL 100 103   < > 101 100 104  CO2 20* 19*   < > 24 23 22   GLUCOSE 157* 106*   < > 87 109* 91  BUN 34* 43*   < > 31* 28* 32*  CREATININE 10.65* 11.59*   < > 10.28* 9.22* 10.77*  CALCIUM 9.4 9.2   < > 9.2 8.8* 9.0  PROT 7.5  --   --   --   --   --   ALBUMIN 3.0* 2.6*  --   --  2.6*  --   AST 37  --   --   --   --   --  ALT 38  --   --   --   --   --   ALKPHOS 193*  --   --   --   --   --   BILITOT 2.7*  --   --   --   --   --   GFRNONAA 5* 5*   < > 5* 6* 5*  GFRAA 6* 5*   < > 6* 7* 6*  ANIONGAP 18* 14   < > 14 15 13    < > = values in this interval not displayed.     Hematology Recent Labs  Lab 09/10/18 1159 09/11/18 1125 09/12/18 0353  WBC 4.8 4.8 4.5  RBC 3.01* 2.74* 2.77*  HGB 8.8* 8.0* 8.1*  HCT 28.5* 25.7* 26.4*  MCV 94.7 93.8 95.3  MCH 29.2 29.2 29.2  MCHC 30.9 31.1 30.7  RDW 15.9* 16.1* 16.2*  PLT 198 179 174    BNPNo results for input(s): BNP, PROBNP in the last 168 hours.   DDimer No results for input(s): DDIMER in the last 168 hours.   Radiology    No results found.  Cardiac Studies    ECHO 2019 Study Conclusions  - Left ventricle: Diffuse hypokinesis worse in the inferior wall.   The cavity size was moderately dilated. Wall thickness was   increased in a pattern of mild LVH. Systolic function was   severely reduced. The estimated ejection fraction was in the   range of 25% to 30%. Doppler parameters are consistent with both   elevated ventricular end-diastolic filling pressure and  elevated   left atrial filling pressure. - Left atrium: The atrium was mildly dilated. - Atrial septum: No defect or patent foramen ovale was identified.  Patient Profile     48 y.o. male with a PMH of non-ischemic cardioyopathy s/p AICD, chronic combined CHF, HTN, HLD, persistent atrial fibrillation on coumadin, VT/VF, DM type 2, ESRD on HD who is being seen today for the evaluation of atrial fibrillation at the request of Dr. Benny Lennert.   Assessment & Plan    1. Persistent atrial fibrillation with RVR: patient has had intermittent afib RVR this admission. Coumadin held x2 days for acute GI bleed, resumed 09/10/2018. He follows with Roderic Palau, NP in the atrial fibrillation clinic and has been on amiodarone 200mg  daily and metoprolol tartrate 25mg  BID for rhythm/rate control. Hypotension has limited rate control options. Suspect RVR is being driven by infection/ acute on chronic anemia, hopeful this will improve as illness/anemia improves.  - Continue coumadin for CHA2DS2-VASc Score of 4 (CHF, HTN, DM type 2, Vascular).  - Continue metoprolol 12.5mg  BID for now - uptitrate to home dose as BP will tolerate- difficult with hypotension with dialysis.  - Continue amiodarone to 200mg  daily, difficult to increase due to QTc   - Given friable gastric mucosa on EGD, favor waiting for recovery from GI bleed in an effort to avoid TEE prior to DCCV. Will schedule patient to be seen by Roderic Palau in the atrial fibrillation clinic next week for close follow-up and consideration for DCCV in 3 weeks if patient remains on therapeutic anticoagulation.  --HR remains elevated post dialysis may be dry leading to increased rate.  Follow up in a fib clinic   2. Chronic combined CHF/NICM: last echo 02/2018 with EF 25-30%, mild LVH, and mildly dilated LA. He has been receiving IVF for management of sepsis and hypotension, currently net +2.2L this admission.  - Continue metoprolol - Continue to monitor volume  status  closely   3. Sepsis: patient presented with fever, tachycardia, and hypotension. CT A/P with c/f enlarged small bowel, mesenteric, and retroperitoneal lymph nodes c/w infection vs inflammation. He has been maintained on IV antibiotics.  - Continue management per primary team  4. GI bleed: patient had an episode of BRBPR on presentation to the ED. GI performed EGD/Cscope which did not show active bleeding but several areas of friable mucosa in the stomach and colon. Cleared to restart anticoagulation. Hgb 8.1  Today  - Continue coumadin per pharmacy.       For questions or updates, please contact [place] Please consult [email or web address] for contact info under        Signed, Cecilie Kicks, NP  09/12/2018, 11:49 AM    ---------------------------------------------------------------------------------------------   History and all data above reviewed.  Patient examined.  I agree with the findings as above.  [name] feels well and is ready go home. Ambulating around the room on my exam. Remains in afib with elevated rate.   Constitutional: No acute distress ENMT: normal dentition, moist mucous membranes Cardiovascular: irregular rhythm, normal rate, no murmurs. S1 and S2 normal. Radial pulses normal bilaterally. No jugular venous distention.  Respiratory: clear to auscultation bilaterally GI : normal bowel sounds, soft and nontender. No distention.   MSK: extremities warm, well perfused. No edema.  NEURO: grossly nonfocal exam, moves all extremities. PSYCH: alert and oriented x 3, normal mood and affect.   All available labs, radiology testing, previous records reviewed. Agree with documented assessment and plan of my colleague as stated above with the following additions or changes:  Principal Problem:   Severe sepsis ([place]) Active Problems:   Type 2 diabetes mellitus ([place])   Automatic implantable cardioverter-defibrillator in situ   Persistent atrial fibrillation    Rectal bleed   Acute blood loss anemia   Hypotension    Plan: plan for afib clinic follow up with cardioversion rescheduled. Concern for performing TEE/DCCV in setting of admission for GI bleed and friable gastric mucosa.   Time Spent Directly with Patient:  I have spent a total of 25 minutes with the patient reviewing hospital notes, telemetry, EKGs, labs and examining the patient as well as establishing an assessment and plan that was discussed personally with the patient.  > 50% of time was spent in direct patient care.  Length of Stay:  LOS: 5 days   Elouise Munroe, MD [facility]  09/12/2018

## 2018-09-12 NOTE — Progress Notes (Addendum)
I have seen and examined this patient and agree with the plan of care    Sherril Croon 09/13/2018, 9:02 AM  [facility] Progress Note   Subjective: On HD HR variable low 100s, BP soft. No C/Os. Still asking to go home.     Objective Vitals:   09/12/18 0703 09/12/18 0708 09/12/18 0730 09/12/18 0800  BP: (!) 84/67 125/75 105/79 90/68  Pulse: 60 99 (!) 140 (!) 102  Resp:  (!) 23    Temp:      TempSrc:      SpO2:      Weight:       Physical Exam General:Chronically ill appearing male in NAD Heart:regularly irreg rate 90s. Lungs:Bilateral breath sounds decreased in bases. Abdomen:Active BS Extremities:trace BLE edema Dialysis Access:RIJ TDC drsg intact, blood lines connected. Maturing L AVF+ bruit   Additional Objective Labs: Basic Metabolic Panel: Recent Labs  Lab 09/08/18 0457  09/10/18 0419 09/11/18 1125 09/12/18 0353  NA 136   < > 139 138 139  K 3.8   < > 3.3* 3.1* 3.3*  CL 103   < > 101 100 104  CO2 19*   < > 24 23 22   GLUCOSE 106*   < > 87 109* 91  BUN 43*   < > 31* 28* 32*  CREATININE 11.59*   < > 10.28* 9.22* 10.77*  CALCIUM 9.2   < > 9.2 8.8* 9.0  PHOS 6.3*  --   --  4.1  --    < > = values in this interval not displayed.   Liver Function Tests: Recent Labs  Lab 09/07/18 0819 09/08/18 0457 09/11/18 1125  AST 37  --   --   ALT 38  --   --   ALKPHOS 193*  --   --   BILITOT 2.7*  --   --   PROT 7.5  --   --   ALBUMIN 3.0* 2.6* 2.6*   No results for input(s): LIPASE, AMYLASE in the last 168 hours. CBC: Recent Labs  Lab 09/09/18 0403 09/10/18 0419 09/10/18 1159 09/11/18 1125 09/12/18 0353  WBC 5.0 4.7 4.8 4.8 4.5  NEUTROABS 3.3 2.9  --   --  2.8  HGB 7.9* 8.2* 8.8* 8.0* 8.1*  HCT 25.1* 26.4* 28.5* 25.7* 26.4*  MCV 94.0 94.0 94.7 93.8 95.3  PLT 183 179 198 179 174   Blood Culture    Component Value Date/Time   SDES BLOOD RIGHT HAND 09/07/2018 0854   SPECREQUEST  09/07/2018 0854    BOTTLES DRAWN AEROBIC AND ANAEROBIC  Blood Culture adequate volume   CULT  09/07/2018 0854    NO GROWTH 5 DAYS Performed at [facility], [place] [address] [place], [place] [address]    REPTSTATUS 09/12/2018 FINAL 09/07/2018 0854    Cardiac Enzymes: No results for input(s): CKTOTAL, CKMB, CKMBINDEX, TROPONINI in the last 168 hours. CBG: Recent Labs  Lab 09/10/18 2209 09/11/18 0838 09/11/18 1209 09/11/18 1602 09/11/18 2223  GLUCAP 122* 109* 100* 102* 144*   Iron Studies: No results for input(s): IRON, TIBC, TRANSFERRIN, FERRITIN in the last 72 hours. @lablastinr3 @ Studies/Results: No results found. Medications: . sodium chloride    . sodium chloride    . cefTRIAXone (ROCEPHIN)  IV 2 g (09/11/18 0937)  . metronidazole 500 mg (09/11/18 2224)   . amiodarone  200 mg Oral Daily  . atorvastatin  40 mg Oral q1800  . Chlorhexidine Gluconate Cloth  6 each Topical Q0600  .  darbepoetin (ARANESP) injection - DIALYSIS  100 mcg Intravenous Q Tue-HD  . docusate sodium  100 mg Oral BID  . doxercalciferol  1 mcg Intravenous Q T,Th,Sa-HD  . heparin  4,000 Units Dialysis Once in dialysis  . hydrocortisone  25 mg Rectal BID  . insulin aspart  0-9 Units Subcutaneous TID WC  . insulin glargine  8 Units Subcutaneous QHS  . metoprolol tartrate  12.5 mg Oral BID  . multivitamin  1 tablet Oral QHS  . sevelamer carbonate  2,400 mg Oral TID WC  . sodium chloride flush  3 mL Intravenous Q12H  . Warfarin - Pharmacist Dosing Inpatient   Does not apply q1800     Dialysis Orders: TTS [place]  4 hr 180 NRe 400/800  94kg3 K/2.25 Ca  -Heparin 4000 units IV TIW -Hectorol 64mcg IV TIW -Aranesp 100 mcg IV weekly  Assessment/Plan: 1. Sepsis -Fever withWBC to 15.9 with ^ N from WBC 6/25 of 6.4 with normal diff- rather acute onset.WBC down to 6.3 near baselineNegativeCOVID/CXR, recent infected ext hemorrhoid, CT showed numerous small bowel mesenteric and retroperitoneal nodes, unintentional weight loss 5 kg this month after  several months of very stable weights worrisome. Trend T bili.2.7 upon admission With normal LFTsAntibiotics per primary 2. Rectal bleeding-went for colonoscopy and endoscopy today. No acute source of bleeding found.Per primary.  3. ESRD- TTS -HD today on schedule. K+ 3.3 on 4.0 K bath. Using tight heparin.  4. Hypertension/volume- Pre wt  93.9 just sl below OP EDW. BP soft, in 90s. Attempting UFG 1.0 total. Volume seems OK, don't think he can tolerate higher UFG today. Monitor.  5. Anemia-HGB 8.1. Aranesp 60 mcg IV 09/08/18. Follow HGB. No further rectal bleeding.Check iron panel today.   6. Metabolic bone disease- Not on VDRA. Continue binders. 7. Nutrition- renal diet/vits/supplements 8. PAF on chronic coumadin -MTP increased this month to 25 bid due to afib with RVR during ED visit 6/10;Wasscheduled cardioversion for 7/01/20but was off coumadin. Cardioversion appears to be on hold. Coumadin resumed 09/10/18. 9. DM -Per primary  Rita H. Brown NP-C 09/12/2018, 8:15 AM  [employer] [phone]

## 2018-09-12 NOTE — Progress Notes (Signed)
I have seen and examined this patient and agree with the plan of care .  Sherril Croon 09/12/2018, 10:52 AM   [facility] Progress Note   Subjective: On HD HR variable low 100s, BP soft. No C/Os. Still asking to go home.     Objective Vitals:   09/12/18 0845 09/12/18 0900 09/12/18 0930 09/12/18 1004  BP: 125/80 105/79 106/81 118/78  Pulse: 82 62 (!) 104 100  Resp:    20  Temp:    98.6 F (37 C)  TempSrc:    Oral  SpO2:    93%  Weight:    93.2 kg   Physical Exam General:Chronically ill appearing male in NAD Heart:regularly irreg rate 90s. Lungs:Bilateral breath sounds decreased in bases. Abdomen:Active BS Extremities:trace BLE edema Dialysis Access:RIJ TDC drsg intact, blood lines connected. Maturing L AVF+ bruit   Additional Objective Labs: Basic Metabolic Panel: Recent Labs  Lab 09/08/18 0457  09/10/18 0419 09/11/18 1125 09/12/18 0353  NA 136   < > 139 138 139  K 3.8   < > 3.3* 3.1* 3.3*  CL 103   < > 101 100 104  CO2 19*   < > 24 23 22   GLUCOSE 106*   < > 87 109* 91  BUN 43*   < > 31* 28* 32*  CREATININE 11.59*   < > 10.28* 9.22* 10.77*  CALCIUM 9.2   < > 9.2 8.8* 9.0  PHOS 6.3*  --   --  4.1  --    < > = values in this interval not displayed.   Liver Function Tests: Recent Labs  Lab 09/07/18 0819 09/08/18 0457 09/11/18 1125  AST 37  --   --   ALT 38  --   --   ALKPHOS 193*  --   --   BILITOT 2.7*  --   --   PROT 7.5  --   --   ALBUMIN 3.0* 2.6* 2.6*   No results for input(s): LIPASE, AMYLASE in the last 168 hours. CBC: Recent Labs  Lab 09/09/18 0403 09/10/18 0419 09/10/18 1159 09/11/18 1125 09/12/18 0353  WBC 5.0 4.7 4.8 4.8 4.5  NEUTROABS 3.3 2.9  --   --  2.8  HGB 7.9* 8.2* 8.8* 8.0* 8.1*  HCT 25.1* 26.4* 28.5* 25.7* 26.4*  MCV 94.0 94.0 94.7 93.8 95.3  PLT 183 179 198 179 174   Blood Culture    Component Value Date/Time   SDES BLOOD RIGHT HAND 09/07/2018 0854   SPECREQUEST  09/07/2018 0854    BOTTLES DRAWN  AEROBIC AND ANAEROBIC Blood Culture adequate volume   CULT  09/07/2018 0854    NO GROWTH 5 DAYS Performed at [facility], [place] [address], [place], [place] [address]    REPTSTATUS 09/12/2018 FINAL 09/07/2018 0854    Cardiac Enzymes: No results for input(s): CKTOTAL, CKMB, CKMBINDEX, TROPONINI in the last 168 hours. CBG: Recent Labs  Lab 09/10/18 2209 09/11/18 0838 09/11/18 1209 09/11/18 1602 09/11/18 2223  GLUCAP 122* 109* 100* 102* 144*   Iron Studies: No results for input(s): IRON, TIBC, TRANSFERRIN, FERRITIN in the last 72 hours. @lablastinr3 @ Studies/Results: No results found. Medications: . cefTRIAXone (ROCEPHIN)  IV 2 g (09/11/18 0937)  . metronidazole 500 mg (09/11/18 2224)   . amiodarone  200 mg Oral Daily  . atorvastatin  40 mg Oral q1800  . Chlorhexidine Gluconate Cloth  6 each Topical Q0600  . darbepoetin (ARANESP) injection - DIALYSIS  100 mcg Intravenous Q Tue-HD  .  docusate sodium  100 mg Oral BID  . doxercalciferol  1 mcg Intravenous Q T,Th,Sa-HD  . hydrocortisone  25 mg Rectal BID  . insulin aspart  0-9 Units Subcutaneous TID WC  . insulin glargine  8 Units Subcutaneous QHS  . metoprolol tartrate  12.5 mg Oral BID  . multivitamin  1 tablet Oral QHS  . sevelamer carbonate  2,400 mg Oral TID WC  . sodium chloride flush  3 mL Intravenous Q12H  . Warfarin - Pharmacist Dosing Inpatient   Does not apply q1800     Dialysis Orders: TTS [place]  4 hr 180 NRe 400/800  94kg3 K/2.25 Ca  -Heparin 4000 units IV TIW -Hectorol 74mcg IV TIW -Aranesp 100 mcg IV weekly  Assessment/Plan: 1. Sepsis -Fever withWBC to 15.9 with ^ N from WBC 6/25 of 6.4 with normal diff- rather acute onset.WBC down to 6.3 near baselineNegativeCOVID/CXR, recent infected ext hemorrhoid, CT showed numerous small bowel mesenteric and retroperitoneal nodes, unintentional weight loss 5 kg this month after several months of very stable weights worrisome. Trend T bili.2.7 upon  admission With normal LFTsAntibiotics per primary 2. Rectal bleeding-went for colonoscopy and endoscopy today. No acute source of bleeding found.Per primary.  3. ESRD- TTS -HD today on schedule. K+ 3.3 on 4.0 K bath. Using tight heparin.  4. Hypertension/volume- Pre wt  93.9 just sl below OP EDW. BP soft, in 90s. Attempting UFG 1.0 total. Volume seems OK, don't think he can tolerate higher UFG today. Monitor.  5. Anemia-HGB 8.1. Aranesp 60 mcg IV 09/08/18. Follow [facility]. No further rectal bleeding.Check iron panel today.   6. Metabolic bone disease- Not on VDRA. Continue binders. 7. Nutrition- renal diet/vits/supplements 8. PAF on chronic coumadin -MTP increased this month to 25 bid due to afib with RVR during ED visit 6/10;Wasscheduled cardioversion for 7/01/20but was off coumadin. Cardioversion appears to be on hold. Coumadin resumed 09/10/18. 9. DM -Per primary  Rita H. Brown NP-C 09/12/2018, 10:51 AM  [employer] [phone]

## 2018-09-12 NOTE — Progress Notes (Signed)
ANTICOAGULATION CONSULT NOTE -follow up  Pharmacy Consult : Resume Warfarin  Indication: atrial fibrillation  Allergies  Allergen Reactions  . No Known Allergies     Patient Measurements: Weight: 205 lb 7.5 oz (93.2 kg) Height: 72 inches  Vital Signs: Temp: 98.6 F (37 C) (07/02 1004) Temp Source: Oral (07/02 1004) BP: 118/78 (07/02 1004) Pulse Rate: 100 (07/02 1004)  Labs: Recent Labs    09/10/18 0419 09/10/18 1159 09/11/18 1125 09/11/18 2041 09/12/18 0353  HGB 8.2* 8.8* 8.0*  --  8.1*  HCT 26.4* 28.5* 25.7*  --  26.4*  PLT 179 198 179  --  174  LABPROT  --   --  28.0* 29.3* 31.4*  INR  --   --  2.7* 2.8* 3.1*  CREATININE 10.28*  --  9.22*  --  10.77*    Estimated Creatinine Clearance: 10.1 mL/min (A) (by C-G formula based on SCr of 10.77 mg/dL (H)).   Medical History: Past Medical History:  Diagnosis Date  . AICD (automatic cardioverter/defibrillator) present   . Atrial flutter ([place])    a. s/p ablation 03/2016  . Chronic kidney disease   . Chronic systolic CHF (congestive heart failure) ([place])   . HCAP (healthcare-associated pneumonia) 03/28/2018  . History of gout   . Hyperlipidemia   . Hypertension   . Myocardial infarction [facility])    "I think I had one a long long time ago" (03/28/2016)  . NICM (nonischemic cardiomyopathy) ([place])    a. LHC 6/06: pLAD 20, pLCx 20-30; b. Echo 5/15:  EF 15%, diffuse HK, restrictive physiology, trivial AI, trivial MR, mild LAE, moderate RVE, moderately reduced RVSF, moderate RAE, mild to moderate TR, PASP 43 mmHg  . Obesity   . Persistent atrial fibrillation   . Stroke ([place])   . Type II diabetes mellitus (HCC)     Medications:  Medications Prior to Admission  Medication Sig Dispense Refill Last Dose  . acetaminophen (TYLENOL) 500 MG tablet Take 1,000 mg by mouth every 6 (six) hours as needed for mild pain or headache.   unk  . amiodarone (PACERONE) 200 MG tablet Take 1 tablet (200 mg total) by mouth daily. 30 tablet 0  09/06/2018  . atorvastatin (LIPITOR) 40 MG tablet Take 1 tablet (40 mg total) by mouth daily at 6 PM. 90 tablet 2 09/06/2018  . insulin glargine (LANTUS) 100 UNIT/ML injection Inject 0.15 mLs (15 Units total) into the skin at bedtime as needed. 10 mL 5 unk  . metoprolol tartrate (LOPRESSOR) 25 MG tablet Take 1 tablet (25 mg total) by mouth 2 (two) times daily. 60 tablet 0 09/06/2018  . multivitamin (RENA-VIT) TABS tablet TAKE 1 TABLET BY MOUTH AT BEDTIME. 30 tablet 6 09/06/2018  . sevelamer carbonate (RENVELA) 800 MG tablet Take 1 tablet (800 mg total) by mouth 3 (three) times daily with meals. 90 tablet 3 09/06/2018  . torsemide (DEMADEX) 20 MG tablet Take 60 mg by mouth daily.   unk  . warfarin (COUMADIN) 5 MG tablet Take As Directed by Coumadin Clinic (Patient taking differently: Take 5-7.5 mg by mouth See admin instructions. Take As Directed by Coumadin Clinic 5 mg (5 mg x 1) every Tue, Thu, Sat; 7.5 mg (5 mg x 1.5) all other days) 45 tablet 3 09/06/2018  . insulin aspart (NOVOLOG) 100 UNIT/ML injection Use 3 units three times a day with meals (Patient taking differently: Inject 0-3 Units into the skin 3 (three) times daily with meals. Use 3 units three times a day  with meals per sliding scale.) 10 mL 3 08/24/2018  . Insulin Syringe-Needle U-100 31G X 15/64" 0.3 ML MISC Use to inject insulin 4 times a day 200 each 12     Assessment: 48 y.o male with h/o ESRD-HD TTSat and h/o Afib, on warfarin PTA, admitted on 6/27 for severe sepsis and rectal bleeding. PTA INR 4.0 on 6/25.  He received Vitamin K 5 mg IVPB x 1 on 6/27 >> INR 2.1 on 6/28 post vitamin K. Warfarin was held here due to rectal bleeding>>Resumed Warfarin 6/30.   PTA  warfarin dose:  7.5mg  daily except 5mg  qTTSat, LD pta on 6/26 , INR 4.0 on 6/24 and INR 2.1 on 6/28, admitted 6/27  On amiodarone pta, continued on admission for Atrial fibrillation  On 09/10/18 s/p EGD : normal,  no active bleeding and s/p colonoscopy 6/30  showed scattered  areas of friable mucosa and large internal hemorrhoids. No source of bleeding found.  Polyp removed with biopsy.  GI okay'd to resume warfarin.   INR today is INR 3.1.  INR increased to supratherapeutic level likely due to drug interaction with Flagyl despite cautious dosing of warfarin at dose lower than on prior to admit.  Flagyl (new this admit) can increase warfarin effect.  Hgb 8.1 low/stable, pltc wnl stable. No bleeding reported.   Goal of Therapy:  INR 2-3 Monitor platelets by anticoagulation protocol: Yes   Plan:  Hold Warfarin today Daily INR, CBC  Thank you for allowing pharmacy to be part of this patients care team.  Nicole Cella, [place] Pharmacist [phone] Please check AMION for all [place] phone numbers After 10:00 PM, call [place] [phone] 09/12/2018,10:48 AM

## 2018-09-12 NOTE — Evaluation (Signed)
Physical Therapy Evaluation and Discharge Patient Details Name: Travis Bryan MRN: [id number] DOB: [birth year] Today's Date: 09/12/2018   History of Present Illness  Pt is a 48 y/o male admitted secondary to sepsis from infectious process. Pt also with rectal bleeding and is s/p colonoscopy. PMH includes a fib, DM, HTN, and ESRD on HD.   Clinical Impression  Patient evaluated by Physical Therapy with no further acute PT needs identified. All education has been completed and the patient has no further questions. Pt performing gait and mobility tasks at a mod I to independent level. Pt somewhat annoyed with PT presence and stated he did not need further services. Refused to practice stair training. Reports friends stay with him at home. See below for any follow-up Physical Therapy or equipment needs. PT is signing off. Thank you for this referral. If needs change, please re-consult.      Follow Up Recommendations No PT follow up    Equipment Recommendations  None recommended by PT    Recommendations for Other Services       Precautions / Restrictions Precautions Precautions: None Restrictions Weight Bearing Restrictions: No      Mobility  Bed Mobility Overal bed mobility: Independent                Transfers Overall transfer level: Modified independent                  Ambulation/Gait Ambulation/Gait assistance: Modified independent (Device/Increase time) Gait Distance (Feet): 150 Feet Assistive device: None Gait Pattern/deviations: Step-through pattern;Decreased stride length;Wide base of support Gait velocity: decreased    General Gait Details: Slower gait, but overall steady without AD. Pt reporting he is at baseline with mobility. Asymptomatic throughout gait.   Stairs Stairs: (pt refusing to practice)          Wheelchair Mobility    Modified Rankin (Stroke Patients Only)       Balance Overall balance assessment: No apparent balance deficits  (not formally assessed)                                           Pertinent Vitals/Pain Pain Assessment: No/denies pain    Home Living Family/patient expects to be discharged to:: Private residence Living Arrangements: Non-relatives/Friends Available Help at Discharge: Friend(s);Available 24 hours/day Type of Home: House Home Access: Stairs to enter Entrance Stairs-Rails: Can reach both;Right;Left Entrance Stairs-Number of Steps: 3 Home Layout: One level Home Equipment: None Additional Comments: Pt getting agitated when asked questions about home info. Pt stating multiple times "why do you need to know?" Educated that home information was gathered to know what we needed to cover from a mobility standpoint for d/c.     Prior Function Level of Independence: Independent               Hand Dominance        Extremity/Trunk Assessment   Upper Extremity Assessment Upper Extremity Assessment: Overall WFL for tasks assessed    Lower Extremity Assessment Lower Extremity Assessment: Overall WFL for tasks assessed    Cervical / Trunk Assessment Cervical / Trunk Assessment: Normal  Communication   Communication: No difficulties  Cognition Arousal/Alertness: Awake/alert Behavior During Therapy: Agitated Overall Cognitive Status: No family/caregiver present to determine baseline cognitive functioning  General Comments: Pt seemed very annoyed with PT presence in room. Short answers when responding to PT questions about home and PLOF.       General Comments General comments (skin integrity, edema, etc.): Pt reports he does not need further PT services.     Exercises     Assessment/Plan    PT Assessment Patent does not need any further PT services  PT Problem List         PT Treatment Interventions      PT Goals (Current goals can be found in the Care Plan section)  Acute Rehab PT Goals Patient Stated  Goal: to go home today PT Goal Formulation: With patient Time For Goal Achievement: 09/12/18 Potential to Achieve Goals: Good    Frequency     Barriers to discharge        Co-evaluation               AM-PAC PT "6 Clicks" Mobility  Outcome Measure Help needed turning from your back to your side while in a flat bed without using bedrails?: None Help needed moving from lying on your back to sitting on the side of a flat bed without using bedrails?: None Help needed moving to and from a bed to a chair (including a wheelchair)?: None Help needed standing up from a chair using your arms (e.g., wheelchair or bedside chair)?: None Help needed to walk in hospital room?: None Help needed climbing 3-5 steps with a railing? : A Little 6 Click Score: 23    End of Session   Activity Tolerance: Patient tolerated treatment well Patient left: in bed;with call bell/phone within reach Nurse Communication: Mobility status PT Visit Diagnosis: Other abnormalities of gait and mobility (R26.89)    Time: 3748-2707 PT Time Calculation (min) (ACUTE ONLY): 10 min   Charges:   PT Evaluation $PT Eval Low Complexity: [place], PT, DPT  Acute Rehabilitation Services  Pager: [phone] Office: [phone]   Rudean Hitt 09/12/2018, 2:33 PM

## 2018-09-12 NOTE — Plan of Care (Signed)
Care plan goals of been met.

## 2018-09-13 NOTE — Telephone Encounter (Signed)
No problem with holding his coumadin. GT

## 2018-09-17 ENCOUNTER — Other Ambulatory Visit: Payer: Self-pay

## 2018-09-17 ENCOUNTER — Ambulatory Visit (HOSPITAL_COMMUNITY)
Admit: 2018-09-17 | Discharge: 2018-09-17 | Disposition: A | Discharge status: Home / Self Care | Payer: Medicare Other | Source: Ambulatory Visit | Attending: Physician Assistant | Admitting: Physician Assistant

## 2018-09-17 ENCOUNTER — Encounter (HOSPITAL_COMMUNITY): Payer: Self-pay | Admitting: Physician Assistant

## 2018-09-17 VITALS — BP 88/58 | HR 98 | Ht 72.0 in | Wt 206.0 lb

## 2018-09-17 DIAGNOSIS — I4819 Other persistent atrial fibrillation: Secondary | ICD-10-CM

## 2018-09-17 DIAGNOSIS — I428 Other cardiomyopathies: Secondary | ICD-10-CM | POA: Diagnosis not present

## 2018-09-17 DIAGNOSIS — Z794 Long term (current) use of insulin: Secondary | ICD-10-CM | POA: Diagnosis not present

## 2018-09-17 DIAGNOSIS — E119 Type 2 diabetes mellitus without complications: Secondary | ICD-10-CM | POA: Insufficient documentation

## 2018-09-17 DIAGNOSIS — Z6827 Body mass index (BMI) 27.0-27.9, adult: Secondary | ICD-10-CM | POA: Insufficient documentation

## 2018-09-17 DIAGNOSIS — Z79899 Other long term (current) drug therapy: Secondary | ICD-10-CM | POA: Diagnosis not present

## 2018-09-17 DIAGNOSIS — Z8249 Family history of ischemic heart disease and other diseases of the circulatory system: Secondary | ICD-10-CM | POA: Insufficient documentation

## 2018-09-17 DIAGNOSIS — Z95810 Presence of automatic (implantable) cardiac defibrillator: Secondary | ICD-10-CM | POA: Diagnosis not present

## 2018-09-17 DIAGNOSIS — Z8673 Personal history of transient ischemic attack (TIA), and cerebral infarction without residual deficits: Secondary | ICD-10-CM | POA: Insufficient documentation

## 2018-09-17 DIAGNOSIS — I252 Old myocardial infarction: Secondary | ICD-10-CM | POA: Diagnosis not present

## 2018-09-17 DIAGNOSIS — Z833 Family history of diabetes mellitus: Secondary | ICD-10-CM | POA: Diagnosis not present

## 2018-09-17 DIAGNOSIS — E785 Hyperlipidemia, unspecified: Secondary | ICD-10-CM | POA: Insufficient documentation

## 2018-09-17 DIAGNOSIS — Z7901 Long term (current) use of anticoagulants: Secondary | ICD-10-CM | POA: Diagnosis not present

## 2018-09-17 DIAGNOSIS — E663 Overweight: Secondary | ICD-10-CM | POA: Diagnosis not present

## 2018-09-17 DIAGNOSIS — N184 Chronic kidney disease, stage 4 (severe): Secondary | ICD-10-CM | POA: Diagnosis not present

## 2018-09-17 DIAGNOSIS — I472 Ventricular tachycardia: Secondary | ICD-10-CM | POA: Diagnosis not present

## 2018-09-17 DIAGNOSIS — I130 Hypertensive heart and chronic kidney disease with heart failure and stage 1 through stage 4 chronic kidney disease, or unspecified chronic kidney disease: Secondary | ICD-10-CM | POA: Diagnosis not present

## 2018-09-17 DIAGNOSIS — I5022 Chronic systolic (congestive) heart failure: Secondary | ICD-10-CM | POA: Diagnosis not present

## 2018-09-17 NOTE — H&P (View-Only) (Signed)
Primary Care Physician: [name], FNP Primary Cardiologist: Dr Aundra Dubin Primary Electrophysiologist: Dr Lovena Le Referring Physician: Dr Heide Spark is a 48 y.o. male with a history of persistent atrial fibrillation, atrial flutter, CKD Stage IV, HTN, NICM, chronic systolic heart failure, ST Jude ICD, and VT who presents for follow up in the [facility]. Patient recently seen in ER for SOB on exertion and fatigue and was found to be in afib with RVR. He was started on metoprolol and discharged. He was also seen back in ER recently again for an abscess. He continues to have symptoms stating that he has trouble walking to his car without becoming fatigued.   Patient recently admitted with sepsis, GI bleed, and afib with RVR. His DCCV was cancelled. On follow up today, patient feels a little better since discharge but still has significant fatigue. He remains in afib today.   Today, he denies symptoms of palpitations, chest pain, orthopnea, PND, lower extremity edema, dizziness, presyncope, syncope, snoring, daytime somnolence, bleeding, or neurologic sequela. The patient is tolerating medications without difficulties and is otherwise without complaint today.    Atrial Fibrillation Risk Factors:  he does not have symptoms or diagnosis of sleep apnea. he does not have a history of rheumatic fever. he does not have a history of alcohol use. The patient does not have a history of early familial atrial fibrillation or other arrhythmias.  he has a BMI of Body mass index is 27.94 kg/m.Marland Kitchen Filed Weights   09/17/18 1416  Weight: 93.4 kg    Family History  Problem Relation Age of Onset  . Hypertension Mother   . Heart disease Mother   . Diabetes Mother      Atrial Fibrillation Management history:  Previous antiarrhythmic drugs: amiodarone Previous cardioversions: 2018 Previous ablations: aflutter 03/2016 CHADS2VASC score: 6 Anticoagulation  history: warfarin   Past Medical History:  Diagnosis Date  . AICD (automatic cardioverter/defibrillator) present   . Atrial flutter ([place])    a. s/p ablation 03/2016  . Chronic kidney disease   . Chronic systolic CHF (congestive heart failure) ([place])   . HCAP (healthcare-associated pneumonia) 03/28/2018  . History of gout   . Hyperlipidemia   . Hypertension   . Myocardial infarction [facility])    "I think I had one a long long time ago" (03/28/2016)  . NICM (nonischemic cardiomyopathy) ([place])    a. LHC 6/06: pLAD 20, pLCx 20-30; b. Echo 5/15:  EF 15%, diffuse HK, restrictive physiology, trivial AI, trivial MR, mild LAE, moderate RVE, moderately reduced RVSF, moderate RAE, mild to moderate TR, PASP 43 mmHg  . Obesity   . Persistent atrial fibrillation   . Stroke ([place])   . Type II diabetes mellitus ([place])    Past Surgical History:  Procedure Laterality Date  . AV FISTULA PLACEMENT Left 04/25/2018   Procedure: ARTERIOVENOUS (AV) VS GRAFT ARM;  Surgeon: Serafina Mitchell, MD;  Location: [place];  Service: Vascular;  Laterality: Left;  . BASCILIC VEIN TRANSPOSITION Left 07/19/2018   Procedure: SECOND STAGE BASILIC VEIN TRANSPOSITION LEFT ARM;  Surgeon: Serafina Mitchell, MD;  Location: [place];  Service: Vascular;  Laterality: Left;  . CARDIOVERSION N/A 04/07/2016   Procedure: CARDIOVERSION;  Surgeon: Thayer Headings, MD;  Location: [facility] ENDOSCOPY;  Service: Cardiovascular;  Laterality: N/A;  . CARDIOVERSION N/A 04/11/2016   Procedure: CARDIOVERSION;  Surgeon: Larey Dresser, MD;  Location: [place];  Service: Cardiovascular;  Laterality: N/A;  .  COLONOSCOPY WITH PROPOFOL N/A 09/10/2018   Procedure: COLONOSCOPY WITH PROPOFOL;  Surgeon: Otis Brace, MD;  Location: [place];  Service: Gastroenterology;  Laterality: N/A;  . ELECTROPHYSIOLOGIC STUDY N/A 03/31/2016   Procedure: A-Flutter Ablation;  Surgeon: Evans Lance, MD;  Location: [place] CV LAB;  Service: Cardiovascular;  Laterality: N/A;   . ESOPHAGOGASTRODUODENOSCOPY (EGD) WITH PROPOFOL N/A 09/10/2018   Procedure: ESOPHAGOGASTRODUODENOSCOPY (EGD) WITH PROPOFOL;  Surgeon: Otis Brace, MD;  Location: [place];  Service: Gastroenterology;  Laterality: N/A;  . EXCHANGE OF A DIALYSIS CATHETER Left 04/25/2018   Procedure: Attempted Exchange Of A Dialysis Catheter on the Left side;  Surgeon: Serafina Mitchell, MD;  Location: [facility] OR;  Service: Vascular;  Laterality: Left;  . IMPLANTABLE CARDIOVERTER DEFIBRILLATOR (ICD) GENERATOR CHANGE N/A 08/27/2013   Procedure: ICD GENERATOR CHANGE;  Surgeon: Evans Lance, MD;  Location: [facility] CATH LAB;  Service: Cardiovascular;  Laterality: N/A;  . INSERTION OF DIALYSIS CATHETER Right 04/25/2018   Procedure: INSERTION OF TUNNELED DIALYSIS CATHETER;  Surgeon: Serafina Mitchell, MD;  Location: [place];  Service: Vascular;  Laterality: Right;  . POLYPECTOMY  09/10/2018   Procedure: POLYPECTOMY;  Surgeon: Otis Brace, MD;  Location: [place] ENDOSCOPY;  Service: Gastroenterology;;  . TEE WITHOUT CARDIOVERSION N/A 03/31/2016   Procedure: TRANSESOPHAGEAL ECHOCARDIOGRAM (TEE);  Surgeon: Larey Dresser, MD;  Location: [facility] ENDOSCOPY;  Service: Cardiovascular;  Laterality: N/A;    Current Outpatient Medications  Medication Sig Dispense Refill  . acetaminophen (TYLENOL) 500 MG tablet Take 1,000 mg by mouth every 6 (six) hours as needed for mild pain or headache.    Marland Kitchen amiodarone (PACERONE) 200 MG tablet Take 1 tablet (200 mg total) by mouth daily. 30 tablet 0  . atorvastatin (LIPITOR) 40 MG tablet Take 1 tablet (40 mg total) by mouth daily at 6 PM. 90 tablet 2  . hydrocortisone (ANUSOL-HC) 25 MG suppository Place 1 suppository (25 mg total) rectally 2 (two) times daily. 100 suppository 0  . insulin aspart (NOVOLOG) 100 UNIT/ML injection Use 3 units three times a day with meals (Patient taking differently: Inject 0-3 Units into the skin 3 (three) times daily with meals. Use 3 units three times a day with meals per  sliding scale.) 10 mL 3  . insulin glargine (LANTUS) 100 UNIT/ML injection Inject 0.08 mLs (8 Units total) into the skin at bedtime as needed. 10 mL 5  . Insulin Syringe-Needle U-100 31G X 15/64" 0.3 ML MISC Use to inject insulin 4 times a day 200 each 12  . metoprolol tartrate (LOPRESSOR) 25 MG tablet Take 0.5 tablets (12.5 mg total) by mouth 2 (two) times daily.    . multivitamin (RENA-VIT) TABS tablet TAKE 1 TABLET BY MOUTH AT BEDTIME. 30 tablet 6  . ondansetron (ZOFRAN) 4 MG tablet Take 1 tablet (4 mg total) by mouth every 6 (six) hours as needed for nausea. 20 tablet 0  . sevelamer carbonate (RENVELA) 800 MG tablet Take 3 tablets (2,400 mg total) by mouth 3 (three) times daily with meals. 90 tablet 2  . warfarin (COUMADIN) 5 MG tablet Take 1 tablet (5 mg total) by mouth daily at 6 PM. Hold Coumadin dose on 09/12/2018.  Start on 09/13/2018. 30 tablet 0   No current facility-administered medications for this encounter.     Allergies  Allergen Reactions  . No Known Allergies     Social History   Socioeconomic History  . Marital status: Widowed    Spouse name: Not on file  . Number of  children: Not on file  . Years of education: Not on file  . Highest education level: Not on file  Occupational History  . Not on file  Social Needs  . Financial resource strain: Not on file  . Food insecurity    Worry: Not on file    Inability: Not on file  . Transportation needs    Medical: Not on file    Non-medical: Not on file  Tobacco Use  . Smoking status: Never Smoker  . Smokeless tobacco: Never Used  . Tobacco comment: pt denies smoking cigars  Substance and Sexual Activity  . Alcohol use: No  . Drug use: No  . Sexual activity: Not Currently  Lifestyle  . Physical activity    Days per week: Not on file    Minutes per session: Not on file  . Stress: Not on file  Relationships  . Social Herbalist on phone: Not on file    Gets together: Not on file    Attends religious  service: Not on file    Active member of club or organization: Not on file    Attends meetings of clubs or organizations: Not on file    Relationship status: Not on file  . Intimate partner violence    Fear of current or ex partner: Not on file    Emotionally abused: Not on file    Physically abused: Not on file    Forced sexual activity: Not on file  Other Topics Concern  . Not on file  Social History Narrative  . Not on file     ROS- All systems are reviewed and negative except as per the HPI above.  Physical Exam: Vitals:   09/17/18 1416  BP: (!) 88/58  Pulse: 98  Weight: 93.4 kg  Height: 6' (1.829 m)    GEN- The patient is well appearing, alert and oriented x 3 today.   HEENT-head normocephalic, atraumatic, sclera clear, conjunctiva pink, hearing intact, trachea midline. Lungs- Clear to ausculation bilaterally, normal work of breathing Heart- irregular rate and rhythm, no murmurs, rubs or gallops  GI- soft, NT, ND, + BS Extremities- no clubbing, cyanosis, or edema MS- no significant deformity or atrophy Skin- no rash or lesion Psych- euthymic mood, full affect Neuro- strength and sensation are intact   Wt Readings from Last 3 Encounters:  09/17/18 93.4 kg  09/12/18 93.2 kg  09/02/18 94.3 kg    EKG today demonstrates afib HR 98, LBBB, slow R wave prog, QRS 138, QTc 582  Echo 02/27/18 demonstrated  - Left ventricle: Diffuse hypokinesis worse in the inferior wall.   The cavity size was moderately dilated. Wall thickness was   increased in a pattern of mild LVH. Systolic function was   severely reduced. The estimated ejection fraction was in the   range of 25% to 30%. Doppler parameters are consistent with both   elevated ventricular end-diastolic filling pressure and elevated   left atrial filling pressure. - Left atrium: The atrium was mildly dilated. - Atrial septum: No defect or patent foramen ovale was identified.  Epic records are reviewed at length  today  Assessment and Plan:  1. Persistent atrial fibrillation Patient recently in hospital with afib RVR in setting of sepsis.  Patient continues in afib today with symptoms of fatigue and SOB despite resolution of sepsis. No room to increase BB as he becomes hypotensive on days then he has HD. His DCCV was cancelled while he was admitted. Although  his coumadin was held 2/2 GI bleed, his INR remained therapeutic. Will d/w Dr Lovena Le about timing of DCCV. Patient is eager to have it done. Continue Lopressor 25 mg BID Continue amiodarone 200 mg daily. Would not increase given QT and wide QRS. Continue warfarin  This patients CHA2DS2-VASc Score and unadjusted Ischemic Stroke Rate (% per year) is equal to 9.7 % stroke rate/year from a score of 6  Above score calculated as 1 point each if present [CHF, HTN, DM, Vascular=MI/PAD/Aortic Plaque, Age if 65-74, or Male] Above score calculated as 2 points each if present [Age > 75, or Stroke/TIA/TE]   2. Overweight Body mass index is 27.94 kg/m. Lifestyle modification was discussed and encouraged including regular physical activity and weight reduction.  3. VF/VT S/p ICD, followed by device clinic and Dr Lovena Le.  4. Chronic systolic CHF No signs or symptoms of fluid overload. No room to titrate BB.   Follow up in AF clinic one week post DCCV.   [facility] [address] [place], [place] [address] [phone] 09/17/2018 4:31 PM

## 2018-09-17 NOTE — Progress Notes (Signed)
Primary Care Physician: [name], FNP Primary Cardiologist: Dr Aundra Dubin Primary Electrophysiologist: Dr Lovena Le Referring Physician: Dr Heide Spark is a 48 y.o. male with a history of persistent atrial fibrillation, atrial flutter, CKD Stage IV, HTN, NICM, chronic systolic heart failure, ST Jude ICD, and VT who presents for follow up in the [facility]. Patient recently seen in ER for SOB on exertion and fatigue and was found to be in afib with RVR. He was started on metoprolol and discharged. He was also seen back in ER recently again for an abscess. He continues to have symptoms stating that he has trouble walking to his car without becoming fatigued.   Patient recently admitted with sepsis, GI bleed, and afib with RVR. His DCCV was cancelled. On follow up today, patient feels a little better since discharge but still has significant fatigue. He remains in afib today.   Today, he denies symptoms of palpitations, chest pain, orthopnea, PND, lower extremity edema, dizziness, presyncope, syncope, snoring, daytime somnolence, bleeding, or neurologic sequela. The patient is tolerating medications without difficulties and is otherwise without complaint today.    Atrial Fibrillation Risk Factors:  he does not have symptoms or diagnosis of sleep apnea. he does not have a history of rheumatic fever. he does not have a history of alcohol use. The patient does not have a history of early familial atrial fibrillation or other arrhythmias.  he has a BMI of Body mass index is 27.94 kg/m.Marland Kitchen Filed Weights   09/17/18 1416  Weight: 93.4 kg    Family History  Problem Relation Age of Onset  . Hypertension Mother   . Heart disease Mother   . Diabetes Mother      Atrial Fibrillation Management history:  Previous antiarrhythmic drugs: amiodarone Previous cardioversions: 2018 Previous ablations: aflutter 03/2016 CHADS2VASC score: 6 Anticoagulation  history: warfarin   Past Medical History:  Diagnosis Date  . AICD (automatic cardioverter/defibrillator) present   . Atrial flutter ([place])    a. s/p ablation 03/2016  . Chronic kidney disease   . Chronic systolic CHF (congestive heart failure) ([place])   . HCAP (healthcare-associated pneumonia) 03/28/2018  . History of gout   . Hyperlipidemia   . Hypertension   . Myocardial infarction [facility])    "I think I had one a long long time ago" (03/28/2016)  . NICM (nonischemic cardiomyopathy) ([place])    a. LHC 6/06: pLAD 20, pLCx 20-30; b. Echo 5/15:  EF 15%, diffuse HK, restrictive physiology, trivial AI, trivial MR, mild LAE, moderate RVE, moderately reduced RVSF, moderate RAE, mild to moderate TR, PASP 43 mmHg  . Obesity   . Persistent atrial fibrillation   . Stroke ([place])   . Type II diabetes mellitus ([place])    Past Surgical History:  Procedure Laterality Date  . AV FISTULA PLACEMENT Left 04/25/2018   Procedure: ARTERIOVENOUS (AV) VS GRAFT ARM;  Surgeon: Serafina Mitchell, MD;  Location: [place];  Service: Vascular;  Laterality: Left;  . BASCILIC VEIN TRANSPOSITION Left 07/19/2018   Procedure: SECOND STAGE BASILIC VEIN TRANSPOSITION LEFT ARM;  Surgeon: Serafina Mitchell, MD;  Location: [place];  Service: Vascular;  Laterality: Left;  . CARDIOVERSION N/A 04/07/2016   Procedure: CARDIOVERSION;  Surgeon: Thayer Headings, MD;  Location: [facility] ENDOSCOPY;  Service: Cardiovascular;  Laterality: N/A;  . CARDIOVERSION N/A 04/11/2016   Procedure: CARDIOVERSION;  Surgeon: Larey Dresser, MD;  Location: [place];  Service: Cardiovascular;  Laterality: N/A;  .  COLONOSCOPY WITH PROPOFOL N/A 09/10/2018   Procedure: COLONOSCOPY WITH PROPOFOL;  Surgeon: Otis Brace, MD;  Location: [place];  Service: Gastroenterology;  Laterality: N/A;  . ELECTROPHYSIOLOGIC STUDY N/A 03/31/2016   Procedure: A-Flutter Ablation;  Surgeon: Evans Lance, MD;  Location: [place] CV LAB;  Service: Cardiovascular;  Laterality: N/A;   . ESOPHAGOGASTRODUODENOSCOPY (EGD) WITH PROPOFOL N/A 09/10/2018   Procedure: ESOPHAGOGASTRODUODENOSCOPY (EGD) WITH PROPOFOL;  Surgeon: Otis Brace, MD;  Location: [place];  Service: Gastroenterology;  Laterality: N/A;  . EXCHANGE OF A DIALYSIS CATHETER Left 04/25/2018   Procedure: Attempted Exchange Of A Dialysis Catheter on the Left side;  Surgeon: Serafina Mitchell, MD;  [facility];  Service: Vascular;  Laterality: Left;  . IMPLANTABLE CARDIOVERTER DEFIBRILLATOR (ICD) GENERATOR CHANGE N/A 08/27/2013   Procedure: ICD GENERATOR CHANGE;  Surgeon: Evans Lance, MD;  Location: [facility] CATH LAB;  Service: Cardiovascular;  Laterality: N/A;  . INSERTION OF DIALYSIS CATHETER Right 04/25/2018   Procedure: INSERTION OF TUNNELED DIALYSIS CATHETER;  Surgeon: Serafina Mitchell, MD;  Location: [place];  Service: Vascular;  Laterality: Right;  . POLYPECTOMY  09/10/2018   Procedure: POLYPECTOMY;  Surgeon: Otis Brace, MD;  Location: [place] ENDOSCOPY;  Service: Gastroenterology;;  . TEE WITHOUT CARDIOVERSION N/A 03/31/2016   Procedure: TRANSESOPHAGEAL ECHOCARDIOGRAM (TEE);  Surgeon: Larey Dresser, MD;  Location: [facility] ENDOSCOPY;  Service: Cardiovascular;  Laterality: N/A;    Current Outpatient Medications  Medication Sig Dispense Refill  . acetaminophen (TYLENOL) 500 MG tablet Take 1,000 mg by mouth every 6 (six) hours as needed for mild pain or headache.    Marland Kitchen amiodarone (PACERONE) 200 MG tablet Take 1 tablet (200 mg total) by mouth daily. 30 tablet 0  . atorvastatin (LIPITOR) 40 MG tablet Take 1 tablet (40 mg total) by mouth daily at 6 PM. 90 tablet 2  . hydrocortisone (ANUSOL-HC) 25 MG suppository Place 1 suppository (25 mg total) rectally 2 (two) times daily. 100 suppository 0  . insulin aspart (NOVOLOG) 100 UNIT/ML injection Use 3 units three times a day with meals (Patient taking differently: Inject 0-3 Units into the skin 3 (three) times daily with meals. Use 3 units three times a day with meals per  sliding scale.) 10 mL 3  . insulin glargine (LANTUS) 100 UNIT/ML injection Inject 0.08 mLs (8 Units total) into the skin at bedtime as needed. 10 mL 5  . Insulin Syringe-Needle U-100 31G X 15/64" 0.3 ML MISC Use to inject insulin 4 times a day 200 each 12  . metoprolol tartrate (LOPRESSOR) 25 MG tablet Take 0.5 tablets (12.5 mg total) by mouth 2 (two) times daily.    . multivitamin (RENA-VIT) TABS tablet TAKE 1 TABLET BY MOUTH AT BEDTIME. 30 tablet 6  . ondansetron (ZOFRAN) 4 MG tablet Take 1 tablet (4 mg total) by mouth every 6 (six) hours as needed for nausea. 20 tablet 0  . sevelamer carbonate (RENVELA) 800 MG tablet Take 3 tablets (2,400 mg total) by mouth 3 (three) times daily with meals. 90 tablet 2  . warfarin (COUMADIN) 5 MG tablet Take 1 tablet (5 mg total) by mouth daily at 6 PM. Hold Coumadin dose on 09/12/2018.  Start on 09/13/2018. 30 tablet 0   No current facility-administered medications for this encounter.     Allergies  Allergen Reactions  . No Known Allergies     Social History   Socioeconomic History  . Marital status: Widowed    Spouse name: Not on file  . Number of  children: Not on file  . Years of education: Not on file  . Highest education level: Not on file  Occupational History  . Not on file  Social Needs  . Financial resource strain: Not on file  . Food insecurity    Worry: Not on file    Inability: Not on file  . Transportation needs    Medical: Not on file    Non-medical: Not on file  Tobacco Use  . Smoking status: Never Smoker  . Smokeless tobacco: Never Used  . Tobacco comment: pt denies smoking cigars  Substance and Sexual Activity  . Alcohol use: No  . Drug use: No  . Sexual activity: Not Currently  Lifestyle  . Physical activity    Days per week: Not on file    Minutes per session: Not on file  . Stress: Not on file  Relationships  . Social Herbalist on phone: Not on file    Gets together: Not on file    Attends religious  service: Not on file    Active member of club or organization: Not on file    Attends meetings of clubs or organizations: Not on file    Relationship status: Not on file  . Intimate partner violence    Fear of current or ex partner: Not on file    Emotionally abused: Not on file    Physically abused: Not on file    Forced sexual activity: Not on file  Other Topics Concern  . Not on file  Social History Narrative  . Not on file     ROS- All systems are reviewed and negative except as per the HPI above.  Physical Exam: Vitals:   09/17/18 1416  BP: (!) 88/58  Pulse: 98  Weight: 93.4 kg  Height: 6' (1.829 m)    GEN- The patient is well appearing, alert and oriented x 3 today.   HEENT-head normocephalic, atraumatic, sclera clear, conjunctiva pink, hearing intact, trachea midline. Lungs- Clear to ausculation bilaterally, normal work of breathing Heart- irregular rate and rhythm, no murmurs, rubs or gallops  GI- soft, NT, ND, + BS Extremities- no clubbing, cyanosis, or edema MS- no significant deformity or atrophy Skin- no rash or lesion Psych- euthymic mood, full affect Neuro- strength and sensation are intact   Wt Readings from Last 3 Encounters:  09/17/18 93.4 kg  09/12/18 93.2 kg  09/02/18 94.3 kg    EKG today demonstrates afib HR 98, LBBB, slow R wave prog, QRS 138, QTc 582  Echo 02/27/18 demonstrated  - Left ventricle: Diffuse hypokinesis worse in the inferior wall.   The cavity size was moderately dilated. Wall thickness was   increased in a pattern of mild LVH. Systolic function was   severely reduced. The estimated ejection fraction was in the   range of 25% to 30%. Doppler parameters are consistent with both   elevated ventricular end-diastolic filling pressure and elevated   left atrial filling pressure. - Left atrium: The atrium was mildly dilated. - Atrial septum: No defect or patent foramen ovale was identified.  Epic records are reviewed at length  today  Assessment and Plan:  1. Persistent atrial fibrillation Patient recently in hospital with afib RVR in setting of sepsis.  Patient continues in afib today with symptoms of fatigue and SOB despite resolution of sepsis. No room to increase BB as he becomes hypotensive on days then he has HD. His DCCV was cancelled while he was admitted. Although  his coumadin was held 2/2 GI bleed, his INR remained therapeutic. Will d/w Dr Lovena Le about timing of DCCV. Patient is eager to have it done. Continue Lopressor 25 mg BID Continue amiodarone 200 mg daily. Would not increase given QT and wide QRS. Continue warfarin  This patients CHA2DS2-VASc Score and unadjusted Ischemic Stroke Rate (% per year) is equal to 9.7 % stroke rate/year from a score of 6  Above score calculated as 1 point each if present [CHF, HTN, DM, Vascular=MI/PAD/Aortic Plaque, Age if 65-74, or Male] Above score calculated as 2 points each if present [Age > 75, or Stroke/TIA/TE]   2. Overweight Body mass index is 27.94 kg/m. Lifestyle modification was discussed and encouraged including regular physical activity and weight reduction.  3. VF/VT S/p ICD, followed by device clinic and Dr Lovena Le.  4. Chronic systolic CHF No signs or symptoms of fluid overload. No room to titrate BB.   Follow up in AF clinic one week post DCCV.   [facility] [address] [place], [place] [address] [phone] 09/17/2018 4:31 PM

## 2018-09-18 ENCOUNTER — Other Ambulatory Visit (HOSPITAL_COMMUNITY): Payer: Self-pay | Admitting: *Deleted

## 2018-09-18 ENCOUNTER — Ambulatory Visit (INDEPENDENT_AMBULATORY_CARE_PROVIDER_SITE_OTHER): Payer: Medicare Other | Admitting: *Deleted

## 2018-09-18 ENCOUNTER — Telehealth: Payer: Self-pay | Admitting: Pharmacist

## 2018-09-18 DIAGNOSIS — I5022 Chronic systolic (congestive) heart failure: Secondary | ICD-10-CM

## 2018-09-18 DIAGNOSIS — I428 Other cardiomyopathies: Secondary | ICD-10-CM | POA: Diagnosis not present

## 2018-09-18 LAB — CUP PACEART REMOTE DEVICE CHECK
Battery Remaining Longevity: 61 mo
Battery Remaining Percentage: 58 %
Battery Voltage: 2.95 V
Brady Statistic RV Percent Paced: 1 %
Date Time Interrogation Session: 20200708060017
HighPow Impedance: 42 Ohm
HighPow Impedance: 42 Ohm
Implantable Lead Implant Date: 20090211
Implantable Lead Location: 753860
Implantable Lead Model: 7121
Implantable Pulse Generator Implant Date: 20150617
Lead Channel Impedance Value: 300 Ohm
Lead Channel Pacing Threshold Amplitude: 1 V
Lead Channel Pacing Threshold Pulse Width: 0.5 ms
Lead Channel Sensing Intrinsic Amplitude: 6.6 mV
Lead Channel Setting Pacing Amplitude: 2.5 V
Lead Channel Setting Pacing Pulse Width: 0.5 ms
Lead Channel Setting Sensing Sensitivity: 0.5 mV
Pulse Gen Serial Number: 7200640

## 2018-09-18 NOTE — Telephone Encounter (Signed)
Pt scheduled for INR check on Friday.

## 2018-09-18 NOTE — Telephone Encounter (Signed)
-----   Message from Leeroy Bock, [facility] sent at 09/18/2018 11:20 AM EDT ----- Regarding: RE: inr for dccv 11:20am - called and left message for pt to schedule INR check for tomorrow or Friday ----- Message ----- From: Juluis Mire, RN Sent: 09/18/2018  11:08 AM EDT To: [facility] Subject: inr for dccv                                   Pt is scheduled for cardioversion on 7/15 - pt will need INR this week (last INR check was 7/2) as well as on 7/15. He has to arrive on 7/15 @ 1245 for admitting. Please call pt to arrange. ThanksStacy

## 2018-09-20 ENCOUNTER — Ambulatory Visit (INDEPENDENT_AMBULATORY_CARE_PROVIDER_SITE_OTHER): Payer: Medicaid Other | Admitting: *Deleted

## 2018-09-20 ENCOUNTER — Telehealth: Payer: Self-pay | Admitting: Family Medicine

## 2018-09-20 ENCOUNTER — Other Ambulatory Visit: Payer: Self-pay

## 2018-09-20 DIAGNOSIS — I483 Typical atrial flutter: Secondary | ICD-10-CM | POA: Diagnosis not present

## 2018-09-20 DIAGNOSIS — I4819 Other persistent atrial fibrillation: Secondary | ICD-10-CM

## 2018-09-20 DIAGNOSIS — Z5181 Encounter for therapeutic drug level monitoring: Secondary | ICD-10-CM | POA: Diagnosis not present

## 2018-09-20 DIAGNOSIS — Z95810 Presence of automatic (implantable) cardiac defibrillator: Secondary | ICD-10-CM

## 2018-09-20 LAB — POCT INR: INR: 5.4 — AB (ref 2.0–3.0)

## 2018-09-20 NOTE — Telephone Encounter (Signed)
For what medication? 

## 2018-09-20 NOTE — Telephone Encounter (Signed)
Patient called stating that he needs a prioritization

## 2018-09-20 NOTE — Patient Instructions (Signed)
Description   Hold tonight's and tomorrow's dose, take 1/2 a tablet on Sunday, then continue taking 1 tablet daily except for 1.5 tablets on Mondays and Fridays. Recheck INR on 09/25/2018 before Cardioversion. Please call coumadin clinic with any changes in medications. [phone]

## 2018-09-23 NOTE — Telephone Encounter (Signed)
Sevelamer, and Rena

## 2018-09-23 NOTE — Telephone Encounter (Signed)
Renvela is prescribed by patient's nephrologist as he suffers from ESRD. Please advise patient or forward message to patient's nephrologist at [facility].

## 2018-09-23 NOTE — Telephone Encounter (Signed)
Sent to pharmacist. Travis Bryan, Travis Bryan

## 2018-09-23 NOTE — Telephone Encounter (Addendum)
Per [place] Medicaid this patient needs to have tried and failed or have a contraindication to a preferred product,calcium acetate capsule are not preferred. The preferred products are calcium acetate capsule or tablet, Renagel tablet, or Renvela PowderPack. Please consider a preferred alternative or provide documentation as to why the patient cannot use a preferred product.

## 2018-09-24 ENCOUNTER — Other Ambulatory Visit (HOSPITAL_COMMUNITY)
Admission: RE | Admit: 2018-09-24 | Discharge: 2018-09-24 | Disposition: A | Discharge status: Home / Self Care | Payer: Medicare Other | Source: Ambulatory Visit | Attending: Cardiology | Admitting: Cardiology

## 2018-09-24 DIAGNOSIS — Z1159 Encounter for screening for other viral diseases: Secondary | ICD-10-CM | POA: Insufficient documentation

## 2018-09-24 NOTE — Telephone Encounter (Signed)
I called [facility] who referred me to his dialysis center stating that they manage his care. I called the dialysis center at [phone] and left a detailed vm with the nurse line and left my line for them to call back if they need any more info.

## 2018-09-25 ENCOUNTER — Ambulatory Visit (HOSPITAL_COMMUNITY)
Admission: RE | Admit: 2018-09-25 | Discharge: 2018-09-25 | Disposition: A | Discharge status: Home / Self Care | Payer: Medicare Other | Attending: Cardiology | Admitting: Cardiology

## 2018-09-25 ENCOUNTER — Encounter (HOSPITAL_COMMUNITY)
Admission: RE | Disposition: A | Discharge status: Home / Self Care | Payer: Self-pay | Source: Home / Self Care | Attending: Cardiology

## 2018-09-25 ENCOUNTER — Encounter (HOSPITAL_COMMUNITY): Payer: Self-pay | Admitting: *Deleted

## 2018-09-25 ENCOUNTER — Ambulatory Visit (HOSPITAL_COMMUNITY): Payer: Medicare Other | Admitting: Certified Registered Nurse Anesthetist

## 2018-09-25 ENCOUNTER — Ambulatory Visit (INDEPENDENT_AMBULATORY_CARE_PROVIDER_SITE_OTHER): Payer: Medicaid Other | Admitting: Pharmacist

## 2018-09-25 ENCOUNTER — Other Ambulatory Visit: Payer: Self-pay

## 2018-09-25 ENCOUNTER — Ambulatory Visit (HOSPITAL_COMMUNITY): Payer: Medicaid Other | Admitting: Physician Assistant

## 2018-09-25 DIAGNOSIS — I4819 Other persistent atrial fibrillation: Secondary | ICD-10-CM | POA: Diagnosis not present

## 2018-09-25 DIAGNOSIS — I5022 Chronic systolic (congestive) heart failure: Secondary | ICD-10-CM | POA: Insufficient documentation

## 2018-09-25 DIAGNOSIS — Z8673 Personal history of transient ischemic attack (TIA), and cerebral infarction without residual deficits: Secondary | ICD-10-CM | POA: Diagnosis not present

## 2018-09-25 DIAGNOSIS — Z5181 Encounter for therapeutic drug level monitoring: Secondary | ICD-10-CM

## 2018-09-25 DIAGNOSIS — Z794 Long term (current) use of insulin: Secondary | ICD-10-CM | POA: Diagnosis not present

## 2018-09-25 DIAGNOSIS — E663 Overweight: Secondary | ICD-10-CM | POA: Insufficient documentation

## 2018-09-25 DIAGNOSIS — E785 Hyperlipidemia, unspecified: Secondary | ICD-10-CM | POA: Insufficient documentation

## 2018-09-25 DIAGNOSIS — Z992 Dependence on renal dialysis: Secondary | ICD-10-CM | POA: Insufficient documentation

## 2018-09-25 DIAGNOSIS — Z95810 Presence of automatic (implantable) cardiac defibrillator: Secondary | ICD-10-CM

## 2018-09-25 DIAGNOSIS — I428 Other cardiomyopathies: Secondary | ICD-10-CM | POA: Diagnosis not present

## 2018-09-25 DIAGNOSIS — E1122 Type 2 diabetes mellitus with diabetic chronic kidney disease: Secondary | ICD-10-CM | POA: Insufficient documentation

## 2018-09-25 DIAGNOSIS — I4892 Unspecified atrial flutter: Secondary | ICD-10-CM | POA: Insufficient documentation

## 2018-09-25 DIAGNOSIS — I130 Hypertensive heart and chronic kidney disease with heart failure and stage 1 through stage 4 chronic kidney disease, or unspecified chronic kidney disease: Secondary | ICD-10-CM | POA: Diagnosis not present

## 2018-09-25 DIAGNOSIS — N184 Chronic kidney disease, stage 4 (severe): Secondary | ICD-10-CM | POA: Diagnosis not present

## 2018-09-25 DIAGNOSIS — Z79899 Other long term (current) drug therapy: Secondary | ICD-10-CM | POA: Insufficient documentation

## 2018-09-25 DIAGNOSIS — I483 Typical atrial flutter: Secondary | ICD-10-CM

## 2018-09-25 DIAGNOSIS — Z7901 Long term (current) use of anticoagulants: Secondary | ICD-10-CM | POA: Insufficient documentation

## 2018-09-25 DIAGNOSIS — Z6827 Body mass index (BMI) 27.0-27.9, adult: Secondary | ICD-10-CM | POA: Diagnosis not present

## 2018-09-25 DIAGNOSIS — I252 Old myocardial infarction: Secondary | ICD-10-CM | POA: Diagnosis not present

## 2018-09-25 HISTORY — PX: CARDIOVERSION: SHX1299

## 2018-09-25 LAB — POCT I-STAT 4, (NA,K, GLUC, HGB,HCT)
Glucose, Bld: 101 mg/dL — ABNORMAL HIGH (ref 70–99)
Glucose, Bld: 107 mg/dL — ABNORMAL HIGH (ref 70–99)
HCT: 34 % — ABNORMAL LOW (ref 39.0–52.0)
HCT: 35 % — ABNORMAL LOW (ref 39.0–52.0)
Hemoglobin: 11.6 g/dL — ABNORMAL LOW (ref 13.0–17.0)
Hemoglobin: 11.9 g/dL — ABNORMAL LOW (ref 13.0–17.0)
Potassium: 5.7 mmol/L — ABNORMAL HIGH (ref 3.5–5.1)
Potassium: 5.9 mmol/L — ABNORMAL HIGH (ref 3.5–5.1)
Sodium: 137 mmol/L (ref 135–145)
Sodium: 137 mmol/L (ref 135–145)

## 2018-09-25 LAB — SARS CORONAVIRUS 2 (TAT 6-24 HRS): SARS Coronavirus 2: NEGATIVE

## 2018-09-25 LAB — POCT INR: INR: 3.8 — AB (ref 2.0–3.0)

## 2018-09-25 SURGERY — CARDIOVERSION
Anesthesia: General

## 2018-09-25 MED ORDER — SODIUM CHLORIDE 0.9 % IV SOLN
INTRAVENOUS | Status: DC | PRN
  Administered 2018-09-25: 13:00:00 via INTRAVENOUS

## 2018-09-25 MED ORDER — PROPOFOL 10 MG/ML IV BOLUS
INTRAVENOUS | Status: DC | PRN
  Administered 2018-09-25: 30 mg via INTRAVENOUS

## 2018-09-25 MED ORDER — LIDOCAINE 2% (20 MG/ML) 5 ML SYRINGE
INTRAMUSCULAR | Status: DC | PRN
  Administered 2018-09-25: 60 mg via INTRAVENOUS

## 2018-09-25 MED ORDER — SODIUM CHLORIDE 0.9 % IV SOLN
INTRAVENOUS | Status: AC | PRN
  Administered 2018-09-25: 500 mL via INTRAVENOUS

## 2018-09-25 MED ORDER — ETOMIDATE 2 MG/ML IV SOLN
INTRAVENOUS | Status: DC | PRN
  Administered 2018-09-25: 14 mg via INTRAVENOUS

## 2018-09-25 NOTE — Anesthesia Preprocedure Evaluation (Signed)
Anesthesia Evaluation  Patient identified by MRN, date of birth, ID band Patient awake    Reviewed: Allergy & Precautions, H&P , NPO status , Patient's Chart, lab work & pertinent test results  Airway Mallampati: II   Neck ROM: full    Dental   Pulmonary neg pulmonary ROS,    breath sounds clear to auscultation       Cardiovascular hypertension, + CAD, + Past MI and +CHF  + Cardiac Defibrillator  Rhythm:irregular Rate:Normal  EF 15%   Neuro/Psych CVA    GI/Hepatic   Endo/Other  diabetes, Type 2  Renal/GU      Musculoskeletal   Abdominal   Peds  Hematology  (+) Blood dyscrasia, anemia ,   Anesthesia Other Findings   Reproductive/Obstetrics                             Anesthesia Physical Anesthesia Plan  ASA: IV  Anesthesia Plan: General   Post-op Pain Management:    Induction: Intravenous  PONV Risk Score and Plan: 2 and Treatment may vary due to age or medical condition  Airway Management Planned: Mask  Additional Equipment:   Intra-op Plan:   Post-operative Plan:   Informed Consent: I have reviewed the patients History and Physical, chart, labs and discussed the procedure including the risks, benefits and alternatives for the proposed anesthesia with the patient or authorized representative who has indicated his/her understanding and acceptance.       Plan Discussed with: CRNA, Anesthesiologist and Surgeon  Anesthesia Plan Comments:         Anesthesia Quick Evaluation

## 2018-09-25 NOTE — CV Procedure (Signed)
Procedure:   DCCV  Indication:  Symptomatic atrial fibrillation  Procedure Note:  The patient signed informed consent.  They have had had therapeutic anticoagulation with coumadin greater than 3 weeks.  Anesthesia was administered by Dr. Marcie Bal. He received lidocaine, etomidate and propofol.  Adequate airway was maintained throughout and vital followed per protocol.  They were cardioverted x 3 with 120, 150, 200J of biphasic synchronized energy.  He remained in atrial fibrillation  There were no apparent complications.  The patient had normal neuro status and respiratory status post procedure with vitals stable as recorded elsewhere.    Follow up:  They will continue on current medical therapy and follow up with cardiology as scheduled. Of note, his POC potassium was 5.9, but he reports that he is due for dialysis tomorrow.  Buford Dresser, MD PhD 09/25/2018 1:24 PM

## 2018-09-25 NOTE — Discharge Instructions (Signed)

## 2018-09-25 NOTE — Patient Instructions (Signed)
Take 1/2 tablet tonight then continue taking 1 tablet daily except for 1.5 tablets on Mondays and Fridays. Recheck INR in 1 week. Please call coumadin clinic with any changes in medications. [phone]

## 2018-09-25 NOTE — Transfer of Care (Signed)
Immediate Anesthesia Transfer of Care Note  Patient: Travis Bryan  Procedure(s) Performed: CARDIOVERSION (N/A )  Patient Location: Endoscopy Unit  Anesthesia Type:MAC  Level of Consciousness: drowsy  Airway & Oxygen Therapy: Patient Spontanous Breathing  Post-op Assessment: Report given to RN and Post -op Vital signs reviewed and stable  Post vital signs: Reviewed and stable  Last Vitals:  Vitals Value Taken Time  BP 109/79 (84)   Temp    Pulse 96   Resp 16   SpO2 93     Last Pain:  Vitals:   09/25/18 1224  TempSrc: Temporal  PainSc: 0-No pain         Complications: No apparent anesthesia complications

## 2018-09-25 NOTE — Interval H&P Note (Signed)
History and Physical Interval Note:  09/25/2018 1:23 PM  Travis Bryan  has presented today for surgery, with the diagnosis of AFIB.  The various methods of treatment have been discussed with the patient and family. After consideration of risks, benefits and other options for treatment, the patient has consented to  Procedure(s): CARDIOVERSION (N/A) as a surgical intervention.  The patient's history has been reviewed, patient examined, no change in status, stable for surgery.  I have reviewed the patient's chart and labs.  Questions were answered to the patient's satisfaction.     Yonas Bunda Harrell Gave

## 2018-09-26 ENCOUNTER — Encounter (HOSPITAL_COMMUNITY): Payer: Self-pay | Admitting: Cardiology

## 2018-09-26 NOTE — Anesthesia Postprocedure Evaluation (Signed)
Anesthesia Post Note  Patient: Travis Bryan  Procedure(s) Performed: CARDIOVERSION (N/A )     Patient location during evaluation: PACU Anesthesia Type: General Level of consciousness: awake and alert Pain management: pain level controlled Vital Signs Assessment: post-procedure vital signs reviewed and stable Respiratory status: spontaneous breathing, nonlabored ventilation, respiratory function stable and patient connected to nasal cannula oxygen Cardiovascular status: blood pressure returned to baseline and stable Postop Assessment: no apparent nausea or vomiting Anesthetic complications: no    Last Vitals:  Vitals:   09/25/18 1350 09/25/18 1400  BP: 101/66 98/70  Pulse: 79 82  Resp: (!) 25 11  Temp:    SpO2: 92% 95%    Last Pain:  Vitals:   09/25/18 1400  TempSrc:   PainSc: 0-No pain                 Erynne Kealey S

## 2018-09-27 MED FILL — LANTUS 100 UNITS/ML VIAL: 100 | 30 days supply | Qty: 10 | Fill #0

## 2018-09-30 ENCOUNTER — Telehealth: Payer: Self-pay

## 2018-09-30 ENCOUNTER — Other Ambulatory Visit: Payer: Self-pay | Admitting: Cardiology

## 2018-09-30 ENCOUNTER — Encounter: Payer: Self-pay | Admitting: Cardiology

## 2018-09-30 DIAGNOSIS — I5022 Chronic systolic (congestive) heart failure: Secondary | ICD-10-CM

## 2018-09-30 MED FILL — ATORVASTATIN 40 MG TABLET: 40 | 30 days supply | Qty: 30 | Fill #3

## 2018-09-30 MED FILL — AMIODARONE HCL 200 MG TAB: 200 | 30 days supply | Qty: 30 | Fill #0

## 2018-09-30 NOTE — Telephone Encounter (Signed)
Unable to lmom for prescreen pt hung up on me

## 2018-09-30 NOTE — Progress Notes (Signed)
Remote ICD transmission.   

## 2018-10-01 ENCOUNTER — Other Ambulatory Visit: Payer: Self-pay

## 2018-10-01 ENCOUNTER — Ambulatory Visit
Admission: EM | Admit: 2018-10-01 | Discharge: 2018-10-01 | Disposition: A | Discharge status: Home / Self Care | Payer: Medicare Other | Attending: Physician Assistant | Admitting: Physician Assistant

## 2018-10-01 DIAGNOSIS — K6289 Other specified diseases of anus and rectum: Secondary | ICD-10-CM | POA: Diagnosis not present

## 2018-10-01 MED ORDER — HYDROCORTISONE (PERIANAL) 2.5 % EX CREA: 1.0000 "application " | TOPICAL_CREAM | Freq: Two times a day (BID) | CUTANEOUS | 0 refills | Status: DC

## 2018-10-01 MED ORDER — AMOXICILLIN-POT CLAVULANATE 875-125 MG PO TABS: 1.0000 | ORAL_TABLET | Freq: Two times a day (BID) | ORAL | 0 refills | Status: DC

## 2018-10-01 MED ORDER — POLYETHYLENE GLYCOL 3350 17 G PO PACK: 17.0000 g | PACK | Freq: Every day | ORAL | 0 refills | Status: DC

## 2018-10-01 MED FILL — AMOX-CLAV 875-125 MG TABLET: 875-125 | 7 days supply | Qty: 14 | Fill #0

## 2018-10-01 MED FILL — PROCTOZONE-HC 2.5 % CREA: 2.5 | 15 days supply | Qty: 30 | Fill #0

## 2018-10-01 NOTE — Discharge Instructions (Addendum)
As discussed, cannot rule out hemorrhoid vs skin infection. Will start augmentin for possible infection. Anusol as needed for possible hemorrhoid. You can use miralax to soften stool. Sitz bath as directed. If symptoms worsen, go to the ED for further evaluation needed.

## 2018-10-01 NOTE — ED Triage Notes (Signed)
Pt c/o abscess to his buttocks area today, states hx of same. Denies drainage.

## 2018-10-01 NOTE — ED Provider Notes (Signed)
[facility]    CSN: [id number] Arrival date & time: 10/01/18  1247      History   Chief Complaint Chief Complaint  Patient presents with  . Abscess    HPI Travis Bryan is a 48 y.o. male.   48 year old male comes in for 2 day history of rectal pain. States noticed it when he wiped after bowel movement. Denies painful bowel movement. Denies pain with sitting. Denies obvious swelling, erythema, warmth. Denies fever, chills, night sweats. Has not tried anything for the symptoms.      Past Medical History:  Diagnosis Date  . AICD (automatic cardioverter/defibrillator) present   . Atrial flutter ([place])    a. s/p ablation 03/2016  . Chronic kidney disease   . Chronic systolic CHF (congestive heart failure) ([place])   . HCAP (healthcare-associated pneumonia) 03/28/2018  . History of gout   . Hyperlipidemia   . Hypertension   . Myocardial infarction [facility])    "I think I had one a long long time ago" (03/28/2016)  . NICM (nonischemic cardiomyopathy) ([place])    a. LHC 6/06: pLAD 20, pLCx 20-30; b. Echo 5/15:  EF 15%, diffuse HK, restrictive physiology, trivial AI, trivial MR, mild LAE, moderate RVE, moderately reduced RVSF, moderate RAE, mild to moderate TR, PASP 43 mmHg  . Obesity   . Persistent atrial fibrillation   . Stroke ([place])   . Type II diabetes mellitus [facility])     Patient Active Problem List   Diagnosis Date Noted  . Severe sepsis ([place]) 09/07/2018  . Rectal bleed 09/07/2018  . Acute blood loss anemia 09/07/2018  . Hypotension 09/07/2018  . Acute renal failure superimposed on stage 4 chronic kidney disease ([place]) 05/01/2018  . Debility 04/26/2018  . Hypoxia   . Post-operative state   . Anemia of chronic disease   . PAF (paroxysmal atrial fibrillation) ([place])   . Pressure injury of skin 04/17/2018  . Bilateral pneumonia 03/29/2018  . Acute respiratory failure with hypoxia ([place]) 03/28/2018  . DKA (diabetic ketoacidosis) ([place]) 02/25/2018  . Acute on chronic  systolic heart failure, NYHA class 4 ([place]) 02/25/2018  . Hyperlipidemia 02/25/2018  . Atrial fibrillation ([place]) 02/25/2018  . CAD (coronary artery disease) 02/25/2018  . Elevated troponin 02/25/2018  . Sepsis due to pneumonia ([place]) 02/25/2018  . AKI (acute kidney injury) ([place]) 02/25/2018  . Type 2 diabetes mellitus with complication, with long-term current use of insulin ([place]) 12/26/2017  . At risk for sexually transmitted disease due to unprotected sex 08/23/2017  . Healthcare maintenance 06/24/2016  . Persistent atrial fibrillation   . CARDIOMYOPATHY, SECONDARY 07/03/2008  . HFrEF (heart failure with reduced ejection fraction) ([place]) 07/03/2008  . Automatic implantable cardioverter-defibrillator in situ 07/03/2008  . Type 2 diabetes mellitus ([place]) 12/26/2005  . Hyperlipidemia associated with type 2 diabetes mellitus ([place]) 12/26/2005  . Hypertension associated with diabetes ([place]) 12/26/2005    Past Surgical History:  Procedure Laterality Date  . AV FISTULA PLACEMENT Left 04/25/2018   Procedure: ARTERIOVENOUS (AV) VS GRAFT ARM;  Surgeon: Serafina Mitchell, MD;  Location: [place];  Service: Vascular;  Laterality: Left;  . BASCILIC VEIN TRANSPOSITION Left 07/19/2018   Procedure: SECOND STAGE BASILIC VEIN TRANSPOSITION LEFT ARM;  Surgeon: Serafina Mitchell, MD;  Location: [place];  Service: Vascular;  Laterality: Left;  . CARDIOVERSION N/A 04/07/2016   Procedure: CARDIOVERSION;  Surgeon: Thayer Headings, MD;  Location: [place];  Service: Cardiovascular;  Laterality: N/A;  . CARDIOVERSION N/A 04/11/2016  Procedure: CARDIOVERSION;  Surgeon: Larey Dresser, MD;  Location: [facility] ENDOSCOPY;  Service: Cardiovascular;  Laterality: N/A;  . CARDIOVERSION N/A 09/25/2018   Procedure: CARDIOVERSION;  Surgeon: Buford Dresser, MD;  Location: [facility] ENDOSCOPY;  Service: Cardiovascular;  Laterality: N/A;  . COLONOSCOPY WITH PROPOFOL N/A 09/10/2018   Procedure: COLONOSCOPY WITH PROPOFOL;  Surgeon: Otis Brace, MD;  Location: [place];  Service: Gastroenterology;  Laterality: N/A;  . ELECTROPHYSIOLOGIC STUDY N/A 03/31/2016   Procedure: A-Flutter Ablation;  Surgeon: Evans Lance, MD;  Location: [place] CV LAB;  Service: Cardiovascular;  Laterality: N/A;  . ESOPHAGOGASTRODUODENOSCOPY (EGD) WITH PROPOFOL N/A 09/10/2018   Procedure: ESOPHAGOGASTRODUODENOSCOPY (EGD) WITH PROPOFOL;  Surgeon: Otis Brace, MD;  Location: [place];  Service: Gastroenterology;  Laterality: N/A;  . EXCHANGE OF A DIALYSIS CATHETER Left 04/25/2018   Procedure: Attempted Exchange Of A Dialysis Catheter on the Left side;  Surgeon: Serafina Mitchell, MD;  Location: [facility] OR;  Service: Vascular;  Laterality: Left;  . IMPLANTABLE CARDIOVERTER DEFIBRILLATOR (ICD) GENERATOR CHANGE N/A 08/27/2013   Procedure: ICD GENERATOR CHANGE;  Surgeon: Evans Lance, MD;  Location: [facility] CATH LAB;  Service: Cardiovascular;  Laterality: N/A;  . INSERTION OF DIALYSIS CATHETER Right 04/25/2018   Procedure: INSERTION OF TUNNELED DIALYSIS CATHETER;  Surgeon: Serafina Mitchell, MD;  Location: [place];  Service: Vascular;  Laterality: Right;  . POLYPECTOMY  09/10/2018   Procedure: POLYPECTOMY;  Surgeon: Otis Brace, MD;  Location: [place] ENDOSCOPY;  Service: Gastroenterology;;  . TEE WITHOUT CARDIOVERSION N/A 03/31/2016   Procedure: TRANSESOPHAGEAL ECHOCARDIOGRAM (TEE);  Surgeon: Larey Dresser, MD;  Location: [place];  Service: Cardiovascular;  Laterality: N/A;       Home Medications    Prior to Admission medications   Medication Sig Start Date End Date Taking? Authorizing Provider  acetaminophen (TYLENOL) 500 MG tablet Take 1,000 mg by mouth every 6 (six) hours as needed for mild pain or headache.    [provider]  amiodarone (PACERONE) 200 MG tablet TAKE 1 TABLET (200 MG TOTAL) BY MOUTH DAILY. 09/30/18   Larey Dresser, MD  amoxicillin-clavulanate (AUGMENTIN) 875-125 MG tablet Take 1 tablet by mouth every 12 (twelve)  hours. 10/01/18   Tasia Catchings, Cainen Burnham V, PA-C  atorvastatin (LIPITOR) 40 MG tablet Take 1 tablet (40 mg total) by mouth daily at 6 PM. 05/13/18   [name], FNP  hydrocortisone (ANUSOL-HC) 2.5 % rectal cream Place 1 application rectally 2 (two) times daily. 10/01/18   Ok Edwards, PA-C  hydrocortisone (ANUSOL-HC) 25 MG suppository Place 1 suppository (25 mg total) rectally 2 (two) times daily. 09/12/18   Aline August, MD  insulin aspart (NOVOLOG) 100 UNIT/ML injection Use 3 units three times a day with meals Patient taking differently: Inject 0-3 Units into the skin 3 (three) times daily with meals. Use 3 units three times a day with meals per sliding scale. 05/13/18 05/13/19  [name], FNP  insulin glargine (LANTUS) 100 UNIT/ML injection Inject 0.08 mLs (8 Units total) into the skin at bedtime as needed. 09/12/18   Aline August, MD  Insulin Syringe-Needle U-100 31G X 15/64" 0.3 ML MISC Use to inject insulin 4 times a day 05/13/18   [name], FNP  metoprolol tartrate (LOPRESSOR) 25 MG tablet Take 0.5 tablets (12.5 mg total) by mouth 2 (two) times daily. 09/12/18   Aline August, MD  multivitamin (RENA-VIT) TABS tablet TAKE 1 TABLET BY MOUTH AT BEDTIME. 07/29/18   [name], FNP  ondansetron [facility])  4 MG tablet Take 1 tablet (4 mg total) by mouth every 6 (six) hours as needed for nausea. 09/12/18   Aline August, MD  polyethylene glycol (MIRALAX) 17 g packet Take 17 g by mouth daily. 10/01/18   Tasia Catchings, Desi Rowe V, PA-C  sevelamer carbonate (RENVELA) 800 MG tablet Take 3 tablets (2,400 mg total) by mouth 3 (three) times daily with meals. 09/12/18   Aline August, MD  warfarin (COUMADIN) 5 MG tablet Take 1 tablet (5 mg total) by mouth daily at 6 PM. Hold Coumadin dose on 09/12/2018.  Start on 09/13/2018. 09/13/18   Aline August, MD    Family History Family History  Problem Relation Age of Onset  . Hypertension Mother   . Heart disease Mother   . Diabetes Mother     Social History Social History    Tobacco Use  . Smoking status: Never Smoker  . Smokeless tobacco: Never Used  . Tobacco comment: pt denies smoking cigars  Substance Use Topics  . Alcohol use: No  . Drug use: No     Allergies   No known allergies   Review of Systems Review of Systems  Reason unable to perform ROS: See HPI as above.     Physical Exam Triage Vital Signs ED Triage Vitals  Enc Vitals Group     BP 10/01/18 1304 92/62     Pulse Rate 10/01/18 1304 99     Resp 10/01/18 1304 18     Temp 10/01/18 1304 98.4 F (36.9 C)     Temp Source 10/01/18 1304 Oral     SpO2 10/01/18 1304 96 %     Weight --      Height --      Head Circumference --      Peak Flow --      Pain Score 10/01/18 1305 3     Pain Loc --      Pain Edu? --      Excl. in [place]? --    No data found.  Updated Vital Signs BP 92/62 (BP Location: Left Arm)   Pulse 99   Temp 98.4 F (36.9 C) (Oral)   Resp 18   SpO2 96%   Visual Acuity Right Eye Distance:   Left Eye Distance:   Bilateral Distance:    Right Eye Near:   Left Eye Near:    Bilateral Near:     Physical Exam Exam conducted with a chaperone present.  Constitutional:      General: He is not in acute distress.    Appearance: He is well-developed. He is not diaphoretic.  HENT:     Head: Normocephalic and atraumatic.  Eyes:     Conjunctiva/sclera: Conjunctivae normal.     Pupils: Pupils are equal, round, and reactive to light.  Genitourinary:      Comments: No tenderness on DRE, no rectal wall tenderness.  Neurological:     Mental Status: He is alert and oriented to person, place, and time.      UC Treatments / Results  Labs (all labs ordered are listed, but only abnormal results are displayed) Labs Reviewed - No data to display  EKG   Radiology No results found.  Procedures Procedures (including critical care time)  Medications Ordered in UC Medications - No data to display  Initial Impression / Assessment and Plan / UC Course  I have  reviewed the triage vital signs and the nursing notes.  Pertinent labs & imaging results that were available during  my care of the patient were reviewed by me and considered in my medical decision making (see chart for details).    Will cover for infection with augmentin. No obvious perirectal abscess at this time. Discussed possible hemorrhoids as well, to monitor symptoms and use miralax to soften stool. Patient to contact INR clinic and inform of antibiotic use and recheck INR as directed. Return precautions given. Patient expresses understanding and agrees to plan.  Final Clinical Impressions(s) / UC Diagnoses   Final diagnoses:  Rectal pain   ED Prescriptions    Medication Sig Dispense Auth. Provider   amoxicillin-clavulanate (AUGMENTIN) 875-125 MG tablet Take 1 tablet by mouth every 12 (twelve) hours. 14 tablet Naseem Adler V, PA-C   hydrocortisone (ANUSOL-HC) 2.5 % rectal cream Place 1 application rectally 2 (two) times daily. 30 g Jimmy Plessinger V, PA-C   polyethylene glycol (MIRALAX) 17 g packet Take 17 g by mouth daily. [address] Tobin Chad, PA-C 10/01/18 1902

## 2018-10-02 ENCOUNTER — Other Ambulatory Visit: Payer: Self-pay

## 2018-10-02 ENCOUNTER — Ambulatory Visit (HOSPITAL_COMMUNITY)
Admission: RE | Admit: 2018-10-02 | Discharge: 2018-10-02 | Disposition: A | Discharge status: Home / Self Care | Payer: Medicare Other | Source: Ambulatory Visit | Attending: Physician Assistant | Admitting: Physician Assistant

## 2018-10-02 ENCOUNTER — Ambulatory Visit (INDEPENDENT_AMBULATORY_CARE_PROVIDER_SITE_OTHER): Payer: Medicaid Other

## 2018-10-02 ENCOUNTER — Encounter (HOSPITAL_COMMUNITY): Payer: Self-pay | Admitting: Physician Assistant

## 2018-10-02 VITALS — BP 106/62 | HR 93 | Ht 72.0 in | Wt 202.8 lb

## 2018-10-02 DIAGNOSIS — N184 Chronic kidney disease, stage 4 (severe): Secondary | ICD-10-CM | POA: Insufficient documentation

## 2018-10-02 DIAGNOSIS — E663 Overweight: Secondary | ICD-10-CM | POA: Diagnosis not present

## 2018-10-02 DIAGNOSIS — Z5181 Encounter for therapeutic drug level monitoring: Secondary | ICD-10-CM | POA: Diagnosis not present

## 2018-10-02 DIAGNOSIS — I4819 Other persistent atrial fibrillation: Secondary | ICD-10-CM | POA: Diagnosis not present

## 2018-10-02 DIAGNOSIS — I483 Typical atrial flutter: Secondary | ICD-10-CM

## 2018-10-02 DIAGNOSIS — I130 Hypertensive heart and chronic kidney disease with heart failure and stage 1 through stage 4 chronic kidney disease, or unspecified chronic kidney disease: Secondary | ICD-10-CM | POA: Diagnosis not present

## 2018-10-02 DIAGNOSIS — I252 Old myocardial infarction: Secondary | ICD-10-CM | POA: Insufficient documentation

## 2018-10-02 DIAGNOSIS — Z95810 Presence of automatic (implantable) cardiac defibrillator: Secondary | ICD-10-CM | POA: Diagnosis not present

## 2018-10-02 DIAGNOSIS — I4892 Unspecified atrial flutter: Secondary | ICD-10-CM | POA: Diagnosis not present

## 2018-10-02 DIAGNOSIS — Z79899 Other long term (current) drug therapy: Secondary | ICD-10-CM | POA: Diagnosis not present

## 2018-10-02 DIAGNOSIS — Z8249 Family history of ischemic heart disease and other diseases of the circulatory system: Secondary | ICD-10-CM | POA: Insufficient documentation

## 2018-10-02 DIAGNOSIS — E1122 Type 2 diabetes mellitus with diabetic chronic kidney disease: Secondary | ICD-10-CM | POA: Insufficient documentation

## 2018-10-02 DIAGNOSIS — I472 Ventricular tachycardia: Secondary | ICD-10-CM | POA: Insufficient documentation

## 2018-10-02 DIAGNOSIS — Z833 Family history of diabetes mellitus: Secondary | ICD-10-CM | POA: Insufficient documentation

## 2018-10-02 DIAGNOSIS — I5022 Chronic systolic (congestive) heart failure: Secondary | ICD-10-CM | POA: Diagnosis not present

## 2018-10-02 DIAGNOSIS — Z794 Long term (current) use of insulin: Secondary | ICD-10-CM | POA: Diagnosis not present

## 2018-10-02 DIAGNOSIS — I428 Other cardiomyopathies: Secondary | ICD-10-CM | POA: Diagnosis not present

## 2018-10-02 DIAGNOSIS — Z6827 Body mass index (BMI) 27.0-27.9, adult: Secondary | ICD-10-CM | POA: Insufficient documentation

## 2018-10-02 DIAGNOSIS — E785 Hyperlipidemia, unspecified: Secondary | ICD-10-CM | POA: Diagnosis not present

## 2018-10-02 DIAGNOSIS — Z7901 Long term (current) use of anticoagulants: Secondary | ICD-10-CM | POA: Diagnosis not present

## 2018-10-02 LAB — PROTIME-INR
INR: 6.9 (ref 0.8–1.2)
Prothrombin Time: 73.6 s — ABNORMAL HIGH (ref 9.1–12.0)

## 2018-10-02 LAB — POCT INR: INR: 7.2 — AB (ref 2.0–3.0)

## 2018-10-02 MED ORDER — METOPROLOL TARTRATE 25 MG PO TABS: 12.5000 mg | ORAL_TABLET | Freq: Two times a day (BID) | ORAL | 3 refills | Status: DC

## 2018-10-02 MED ORDER — AMIODARONE HCL 200 MG PO TABS: ORAL_TABLET | ORAL | 2 refills | Status: DC

## 2018-10-02 MED FILL — METOPROLOL TARTRATE 25 MG T: 25 | 30 days supply | Qty: 30 | Fill #0

## 2018-10-02 NOTE — Patient Instructions (Signed)
Increase Amiodarone 200mg  twice a day  Continue weekly INRs.

## 2018-10-02 NOTE — Progress Notes (Signed)
Primary Care Physician: [name], FNP Primary Cardiologist: Dr Aundra Dubin Primary Electrophysiologist: Dr Lovena Le Referring Physician: Dr Heide Spark is a 48 y.o. male with a history of persistent atrial fibrillation, atrial flutter, CKD Stage IV, HTN, NICM, chronic systolic heart failure, ST Jude ICD, and VT who presents for follow up in the [facility]. Patient recently seen in ER for SOB on exertion and fatigue and was found to be in afib with RVR. He was started on metoprolol and discharged. He was also seen back in ER recently again for an abscess. He continues to have symptoms stating that he has trouble walking to his car without becoming fatigued.   Patient is s/p unsuccessful DCCV on 09/15/18. He remains in afib today. He does report that his symptoms of fatigue and SOB are mildly improved.   Today, he denies symptoms of palpitations, chest pain, orthopnea, PND, lower extremity edema, dizziness, presyncope, syncope, snoring, daytime somnolence, bleeding, or neurologic sequela. The patient is tolerating medications without difficulties and is otherwise without complaint today. +fatigue   Atrial Fibrillation Risk Factors:  he does not have symptoms or diagnosis of sleep apnea. he does not have a history of rheumatic fever. he does not have a history of alcohol use. The patient does not have a history of early familial atrial fibrillation or other arrhythmias.  he has a BMI of Body mass index is 27.5 kg/m.Marland Kitchen Filed Weights   10/02/18 1336  Weight: 92 kg    Family History  Problem Relation Age of Onset  . Hypertension Mother   . Heart disease Mother   . Diabetes Mother      Atrial Fibrillation Management history:  Previous antiarrhythmic drugs: amiodarone Previous cardioversions: 2018, 09/25/18 Previous ablations: aflutter 03/2016 CHADS2VASC score: 6 Anticoagulation history: warfarin   Past Medical History:  Diagnosis Date   . AICD (automatic cardioverter/defibrillator) present   . Atrial flutter ([place])    a. s/p ablation 03/2016  . Chronic kidney disease   . Chronic systolic CHF (congestive heart failure) ([place])   . HCAP (healthcare-associated pneumonia) 03/28/2018  . History of gout   . Hyperlipidemia   . Hypertension   . Myocardial infarction [facility])    "I think I had one a long long time ago" (03/28/2016)  . NICM (nonischemic cardiomyopathy) ([place])    a. LHC 6/06: pLAD 20, pLCx 20-30; b. Echo 5/15:  EF 15%, diffuse HK, restrictive physiology, trivial AI, trivial MR, mild LAE, moderate RVE, moderately reduced RVSF, moderate RAE, mild to moderate TR, PASP 43 mmHg  . Obesity   . Persistent atrial fibrillation   . Stroke ([place])   . Type II diabetes mellitus ([place])    Past Surgical History:  Procedure Laterality Date  . AV FISTULA PLACEMENT Left 04/25/2018   Procedure: ARTERIOVENOUS (AV) VS GRAFT ARM;  Surgeon: Serafina Mitchell, MD;  Location: [place];  Service: Vascular;  Laterality: Left;  . BASCILIC VEIN TRANSPOSITION Left 07/19/2018   Procedure: SECOND STAGE BASILIC VEIN TRANSPOSITION LEFT ARM;  Surgeon: Serafina Mitchell, MD;  Location: [place];  Service: Vascular;  Laterality: Left;  . CARDIOVERSION N/A 04/07/2016   Procedure: CARDIOVERSION;  Surgeon: Thayer Headings, MD;  Location: [facility] ENDOSCOPY;  Service: Cardiovascular;  Laterality: N/A;  . CARDIOVERSION N/A 04/11/2016   Procedure: CARDIOVERSION;  Surgeon: Larey Dresser, MD;  Location: [facility] ENDOSCOPY;  Service: Cardiovascular;  Laterality: N/A;  . CARDIOVERSION N/A 09/25/2018   Procedure: CARDIOVERSION;  Surgeon: Harrell Gave,  Shawna Orleans, MD;  Location: [place];  Service: Cardiovascular;  Laterality: N/A;  . COLONOSCOPY WITH PROPOFOL N/A 09/10/2018   Procedure: COLONOSCOPY WITH PROPOFOL;  Surgeon: Otis Brace, MD;  Location: [place];  Service: Gastroenterology;  Laterality: N/A;  . ELECTROPHYSIOLOGIC STUDY N/A 03/31/2016   Procedure: A-Flutter Ablation;   Surgeon: Evans Lance, MD;  Location: [place] CV LAB;  Service: Cardiovascular;  Laterality: N/A;  . ESOPHAGOGASTRODUODENOSCOPY (EGD) WITH PROPOFOL N/A 09/10/2018   Procedure: ESOPHAGOGASTRODUODENOSCOPY (EGD) WITH PROPOFOL;  Surgeon: Otis Brace, MD;  Location: [place];  Service: Gastroenterology;  Laterality: N/A;  . EXCHANGE OF A DIALYSIS CATHETER Left 04/25/2018   Procedure: Attempted Exchange Of A Dialysis Catheter on the Left side;  Surgeon: Serafina Mitchell, MD;  Location: [facility] OR;  Service: Vascular;  Laterality: Left;  . IMPLANTABLE CARDIOVERTER DEFIBRILLATOR (ICD) GENERATOR CHANGE N/A 08/27/2013   Procedure: ICD GENERATOR CHANGE;  Surgeon: Evans Lance, MD;  Location: [facility] CATH LAB;  Service: Cardiovascular;  Laterality: N/A;  . INSERTION OF DIALYSIS CATHETER Right 04/25/2018   Procedure: INSERTION OF TUNNELED DIALYSIS CATHETER;  Surgeon: Serafina Mitchell, MD;  Location: [place];  Service: Vascular;  Laterality: Right;  . POLYPECTOMY  09/10/2018   Procedure: POLYPECTOMY;  Surgeon: Otis Brace, MD;  Location: [place] ENDOSCOPY;  Service: Gastroenterology;;  . TEE WITHOUT CARDIOVERSION N/A 03/31/2016   Procedure: TRANSESOPHAGEAL ECHOCARDIOGRAM (TEE);  Surgeon: Larey Dresser, MD;  Location: [facility] ENDOSCOPY;  Service: Cardiovascular;  Laterality: N/A;    Current Outpatient Medications  Medication Sig Dispense Refill  . acetaminophen (TYLENOL) 500 MG tablet Take 1,000 mg by mouth every 6 (six) hours as needed for mild pain or headache.    Marland Kitchen amiodarone (PACERONE) 200 MG tablet Take 1 tablet twice a day for 1 month then reduce to 1 tablet a week 60 tablet 2  . atorvastatin (LIPITOR) 40 MG tablet Take 1 tablet (40 mg total) by mouth daily at 6 PM. 90 tablet 2  . hydrocortisone (ANUSOL-HC) 2.5 % rectal cream Place 1 application rectally 2 (two) times daily. 30 g 0  . hydrocortisone (ANUSOL-HC) 25 MG suppository Place 1 suppository (25 mg total) rectally 2 (two) times daily. 100  suppository 0  . insulin aspart (NOVOLOG) 100 UNIT/ML injection Use 3 units three times a day with meals (Patient taking differently: Inject 0-3 Units into the skin 3 (three) times daily with meals. Use 3 units three times a day with meals per sliding scale.) 10 mL 3  . insulin glargine (LANTUS) 100 UNIT/ML injection Inject 0.08 mLs (8 Units total) into the skin at bedtime as needed. 10 mL 5  . Insulin Syringe-Needle U-100 31G X 15/64" 0.3 ML MISC Use to inject insulin 4 times a day 200 each 12  . metoprolol tartrate (LOPRESSOR) 25 MG tablet Take 0.5 tablets (12.5 mg total) by mouth 2 (two) times daily. 30 tablet 3  . multivitamin (RENA-VIT) TABS tablet TAKE 1 TABLET BY MOUTH AT BEDTIME. 30 tablet 6  . ondansetron (ZOFRAN) 4 MG tablet Take 1 tablet (4 mg total) by mouth every 6 (six) hours as needed for nausea. 20 tablet 0  . polyethylene glycol (MIRALAX) 17 g packet Take 17 g by mouth daily. 14 each 0  . sevelamer carbonate (RENVELA) 800 MG tablet Take 3 tablets (2,400 mg total) by mouth 3 (three) times daily with meals. 90 tablet 2  . warfarin (COUMADIN) 5 MG tablet Take 1 tablet (5 mg total) by mouth daily at 6 PM. Hold Coumadin dose  on 09/12/2018.  Start on 09/13/2018. 30 tablet 0   No current facility-administered medications for this encounter.     Allergies  Allergen Reactions  . No Known Allergies     Social History   Socioeconomic History  . Marital status: Widowed    Spouse name: Not on file  . Number of children: Not on file  . Years of education: Not on file  . Highest education level: Not on file  Occupational History  . Not on file  Social Needs  . Financial resource strain: Not on file  . Food insecurity    Worry: Not on file    Inability: Not on file  . Transportation needs    Medical: Not on file    Non-medical: Not on file  Tobacco Use  . Smoking status: Never Smoker  . Smokeless tobacco: Never Used  . Tobacco comment: pt denies smoking cigars  Substance and  Sexual Activity  . Alcohol use: No  . Drug use: No  . Sexual activity: Not Currently  Lifestyle  . Physical activity    Days per week: Not on file    Minutes per session: Not on file  . Stress: Not on file  Relationships  . Social Herbalist on phone: Not on file    Gets together: Not on file    Attends religious service: Not on file    Active member of club or organization: Not on file    Attends meetings of clubs or organizations: Not on file    Relationship status: Not on file  . Intimate partner violence    Fear of current or ex partner: Not on file    Emotionally abused: Not on file    Physically abused: Not on file    Forced sexual activity: Not on file  Other Topics Concern  . Not on file  Social History Narrative  . Not on file     ROS- All systems are reviewed and negative except as per the HPI above.  Physical Exam: Vitals:   10/02/18 1336  BP: 106/62  Pulse: 93  Weight: 92 kg  Height: 6' (1.829 m)    GEN- The patient is well appearing, alert and oriented x 3 today.   HEENT-head normocephalic, atraumatic, sclera clear, conjunctiva pink, hearing intact, trachea midline. Lungs- Clear to ausculation bilaterally, normal work of breathing Heart- irregular rate and rhythm, no murmurs, rubs or gallops  GI- soft, NT, ND, + BS Extremities- no clubbing, cyanosis, or edema MS- no significant deformity or atrophy Skin- no rash or lesion Psych- euthymic mood, full affect Neuro- strength and sensation are intact    Wt Readings from Last 3 Encounters:  10/02/18 92 kg  09/25/18 93.4 kg  09/17/18 93.4 kg    EKG today demonstrates afib HR 93, LBBB, QRS 196, QTc 574  Echo 02/27/18 demonstrated  - Left ventricle: Diffuse hypokinesis worse in the inferior wall.   The cavity size was moderately dilated. Wall thickness was   increased in a pattern of mild LVH. Systolic function was   severely reduced. The estimated ejection fraction was in the   range of  25% to 30%. Doppler parameters are consistent with both   elevated ventricular end-diastolic filling pressure and elevated   left atrial filling pressure. - Left atrium: The atrium was mildly dilated. - Atrial septum: No defect or patent foramen ovale was identified.  Epic records are reviewed at length today  Assessment and Plan:  1.  Persistent atrial fibrillation Patient s/p unsuccessful DCCV 09/25/18. Patient continues in afib today with symptoms of fatigue and SOB. No room to increase BB as he becomes hypotensive on days then he has HD. After d/w Dr Lovena Le, will increase amiodarone to 200 mg BID.  Check amiodarone level. Continue Lopressor 25 mg BID Continue warfarin. Would continue weekly INR in anticipation of repeat DCCV on higher dose of amiodarone.  Ultimately, he may require rate control. Could consider digoxin if needed for rate control given CHF and hypotension.  This patients CHA2DS2-VASc Score and unadjusted Ischemic Stroke Rate (% per year) is equal to 9.7 % stroke rate/year from a score of 6  Above score calculated as 1 point each if present [CHF, HTN, DM, Vascular=MI/PAD/Aortic Plaque, Age if 65-74, or Male] Above score calculated as 2 points each if present [Age > 75, or Stroke/TIA/TE]   2. Overweight Body mass index is 27.5 kg/m. Lifestyle modification was discussed and encouraged including regular physical activity and weight reduction.  3. VF/VT S/p ICD, followed by device clinic and Dr Lovena Le.  4. Chronic systolic CHF EF 34-37%. No signs or symptoms of fluid overload. No room to titrate BB.   Follow up in the AF clinic in 3 weeks.   [facility] [address] [place], [place] [address] [phone] 10/02/2018 2:14 PM

## 2018-10-02 NOTE — Patient Instructions (Addendum)
  Description   Skip today and tomorrow's dosage of Coumadin, sent pt for STAT INR will call once results received. Go to ED with bleeding problems. Attempted to call pt, LMOM with detailed dosage instructions: pt to hold Coumadin x 3 dosages, then take 1/2 tablet on Saturday, then start taking 1 tablet daily. Spoke with pt 7/23 at 1234 and reminded pt of instructions and left a detailed voicemail of the dosing instructions on his voicemail, too, per request. Recheck INR in 1 week. Please call coumadin clinic with any changes in medications. [phone]

## 2018-10-03 ENCOUNTER — Emergency Department (HOSPITAL_COMMUNITY)
Admission: EM | Admit: 2018-10-03 | Discharge: 2018-10-03 | Disposition: A | Discharge status: Home / Self Care | Payer: Medicare Other | Attending: Emergency Medicine | Admitting: Emergency Medicine

## 2018-10-03 ENCOUNTER — Other Ambulatory Visit: Payer: Self-pay

## 2018-10-03 DIAGNOSIS — I5022 Chronic systolic (congestive) heart failure: Secondary | ICD-10-CM | POA: Insufficient documentation

## 2018-10-03 DIAGNOSIS — Z7901 Long term (current) use of anticoagulants: Secondary | ICD-10-CM | POA: Insufficient documentation

## 2018-10-03 DIAGNOSIS — N184 Chronic kidney disease, stage 4 (severe): Secondary | ICD-10-CM | POA: Insufficient documentation

## 2018-10-03 DIAGNOSIS — E1122 Type 2 diabetes mellitus with diabetic chronic kidney disease: Secondary | ICD-10-CM | POA: Insufficient documentation

## 2018-10-03 DIAGNOSIS — Z794 Long term (current) use of insulin: Secondary | ICD-10-CM | POA: Insufficient documentation

## 2018-10-03 DIAGNOSIS — Z95810 Presence of automatic (implantable) cardiac defibrillator: Secondary | ICD-10-CM | POA: Diagnosis not present

## 2018-10-03 DIAGNOSIS — Z8673 Personal history of transient ischemic attack (TIA), and cerebral infarction without residual deficits: Secondary | ICD-10-CM | POA: Diagnosis not present

## 2018-10-03 DIAGNOSIS — I130 Hypertensive heart and chronic kidney disease with heart failure and stage 1 through stage 4 chronic kidney disease, or unspecified chronic kidney disease: Secondary | ICD-10-CM | POA: Insufficient documentation

## 2018-10-03 DIAGNOSIS — K6289 Other specified diseases of anus and rectum: Secondary | ICD-10-CM | POA: Diagnosis present

## 2018-10-03 DIAGNOSIS — K611 Rectal abscess: Secondary | ICD-10-CM | POA: Insufficient documentation

## 2018-10-03 DIAGNOSIS — I2510 Atherosclerotic heart disease of native coronary artery without angina pectoris: Secondary | ICD-10-CM | POA: Diagnosis not present

## 2018-10-03 DIAGNOSIS — I252 Old myocardial infarction: Secondary | ICD-10-CM | POA: Insufficient documentation

## 2018-10-03 MED ORDER — LIDOCAINE-EPINEPHRINE (PF) 2 %-1:200000 IJ SOLN
20.0000 mL | Freq: Once | INTRAMUSCULAR | Status: AC
  Administered 2018-10-03: 20 mL
  Filled 2018-10-03: qty 20

## 2018-10-03 MED ORDER — AMOXICILLIN-POT CLAVULANATE 875-125 MG PO TABS: 1.0000 | ORAL_TABLET | Freq: Two times a day (BID) | ORAL | 0 refills | Status: DC

## 2018-10-03 MED ORDER — AMOXICILLIN-POT CLAVULANATE 875-125 MG PO TABS
1.0000 | ORAL_TABLET | Freq: Once | ORAL | Status: AC
  Administered 2018-10-03: 1 via ORAL
  Filled 2018-10-03: qty 1

## 2018-10-03 NOTE — Discharge Instructions (Addendum)
Please read attached information. If you experience any new or worsening signs or symptoms please return to the emergency room for evaluation. Please follow-up with your primary care provider or specialist as discussed. Please use medication prescribed only as directed and discontinue taking if you have any concerning signs or symptoms.   °

## 2018-10-03 NOTE — ED Provider Notes (Signed)
[facility] EMERGENCY DEPARTMENT Provider Note   CSN: [id number] Arrival date & time: 10/03/18  1233    History   Chief Complaint Chief Complaint  Patient presents with  . Recurrent Skin Infections    HPI Travis Bryan is a 48 y.o. male.     HPI  48 year old male presents today with complaints of rectal pain and swelling.  Patient notes symptoms started approximately 3 days ago with a bump on his rectum.  He notes he was seen in urgent care told he need to take antibiotics and follow-up.  He notes he did not get the antibiotics as he cannot afford them, he is using a topical cream.  He notes the swelling has persisted with no worsening.  He denies any fever, denies any blood or discharge in his stool or from the area.  He denies any abdominal pain.  Past Medical History:  Diagnosis Date  . AICD (automatic cardioverter/defibrillator) present   . Atrial flutter ([place])    a. s/p ablation 03/2016  . Chronic kidney disease   . Chronic systolic CHF (congestive heart failure) ([place])   . HCAP (healthcare-associated pneumonia) 03/28/2018  . History of gout   . Hyperlipidemia   . Hypertension   . Myocardial infarction [facility])    "I think I had one a long long time ago" (03/28/2016)  . NICM (nonischemic cardiomyopathy) ([place])    a. LHC 6/06: pLAD 20, pLCx 20-30; b. Echo 5/15:  EF 15%, diffuse HK, restrictive physiology, trivial AI, trivial MR, mild LAE, moderate RVE, moderately reduced RVSF, moderate RAE, mild to moderate TR, PASP 43 mmHg  . Obesity   . Persistent atrial fibrillation   . Stroke ([place])   . Type II diabetes mellitus [facility])     Patient Active Problem List   Diagnosis Date Noted  . Severe sepsis ([place]) 09/07/2018  . Rectal bleed 09/07/2018  . Acute blood loss anemia 09/07/2018  . Hypotension 09/07/2018  . Acute renal failure superimposed on stage 4 chronic kidney disease ([place]) 05/01/2018  . Debility 04/26/2018  . Hypoxia   . Post-operative state   .  Anemia of chronic disease   . PAF (paroxysmal atrial fibrillation) ([place])   . Pressure injury of skin 04/17/2018  . Bilateral pneumonia 03/29/2018  . Acute respiratory failure with hypoxia ([place]) 03/28/2018  . DKA (diabetic ketoacidosis) ([place]) 02/25/2018  . Acute on chronic systolic heart failure, NYHA class 4 ([place]) 02/25/2018  . Hyperlipidemia 02/25/2018  . Atrial fibrillation ([place]) 02/25/2018  . CAD (coronary artery disease) 02/25/2018  . Elevated troponin 02/25/2018  . Sepsis due to pneumonia ([place]) 02/25/2018  . AKI (acute kidney injury) ([place]) 02/25/2018  . Type 2 diabetes mellitus with complication, with long-term current use of insulin ([place]) 12/26/2017  . At risk for sexually transmitted disease due to unprotected sex 08/23/2017  . Healthcare maintenance 06/24/2016  . Persistent atrial fibrillation   . CARDIOMYOPATHY, SECONDARY 07/03/2008  . HFrEF (heart failure with reduced ejection fraction) ([place]) 07/03/2008  . Automatic implantable cardioverter-defibrillator in situ 07/03/2008  . Type 2 diabetes mellitus ([place]) 12/26/2005  . Hyperlipidemia associated with type 2 diabetes mellitus ([place]) 12/26/2005  . Hypertension associated with diabetes ([place]) 12/26/2005    Past Surgical History:  Procedure Laterality Date  . AV FISTULA PLACEMENT Left 04/25/2018   Procedure: ARTERIOVENOUS (AV) VS GRAFT ARM;  Surgeon: Serafina Mitchell, MD;  Location: [facility];  Service: Vascular;  Laterality: Left;  . BASCILIC VEIN TRANSPOSITION Left 07/19/2018   Procedure: SECOND  STAGE BASILIC VEIN TRANSPOSITION LEFT ARM;  Surgeon: Serafina Mitchell, MD;  Location: [facility];  Service: Vascular;  Laterality: Left;  . CARDIOVERSION N/A 04/07/2016   Procedure: CARDIOVERSION;  Surgeon: Thayer Headings, MD;  Location: [facility];  Service: Cardiovascular;  Laterality: N/A;  . CARDIOVERSION N/A 04/11/2016   Procedure: CARDIOVERSION;  Surgeon: Larey Dresser, MD;  Location: [facility];  Service: Cardiovascular;  Laterality:  N/A;  . CARDIOVERSION N/A 09/25/2018   Procedure: CARDIOVERSION;  Surgeon: Buford Dresser, MD;  Location: [facility] ENDOSCOPY;  Service: Cardiovascular;  Laterality: N/A;  . COLONOSCOPY WITH PROPOFOL N/A 09/10/2018   Procedure: COLONOSCOPY WITH PROPOFOL;  Surgeon: Otis Brace, MD;  Location: [facility];  Service: Gastroenterology;  Laterality: N/A;  . ELECTROPHYSIOLOGIC STUDY N/A 03/31/2016   Procedure: A-Flutter Ablation;  Surgeon: Evans Lance, MD;  Location: [facility] CV LAB;  Service: Cardiovascular;  Laterality: N/A;  . ESOPHAGOGASTRODUODENOSCOPY (EGD) WITH PROPOFOL N/A 09/10/2018   Procedure: ESOPHAGOGASTRODUODENOSCOPY (EGD) WITH PROPOFOL;  Surgeon: Otis Brace, MD;  Location: [facility];  Service: Gastroenterology;  Laterality: N/A;  . EXCHANGE OF A DIALYSIS CATHETER Left 04/25/2018   Procedure: Attempted Exchange Of A Dialysis Catheter on the Left side;  Surgeon: Serafina Mitchell, MD;  Location: [facility] OR;  Service: Vascular;  Laterality: Left;  . IMPLANTABLE CARDIOVERTER DEFIBRILLATOR (ICD) GENERATOR CHANGE N/A 08/27/2013   Procedure: ICD GENERATOR CHANGE;  Surgeon: Evans Lance, MD;  Location: [facility] CATH LAB;  Service: Cardiovascular;  Laterality: N/A;  . INSERTION OF DIALYSIS CATHETER Right 04/25/2018   Procedure: INSERTION OF TUNNELED DIALYSIS CATHETER;  Surgeon: Serafina Mitchell, MD;  Location: [facility];  Service: Vascular;  Laterality: Right;  . POLYPECTOMY  09/10/2018   Procedure: POLYPECTOMY;  Surgeon: Otis Brace, MD;  Location: [facility] ENDOSCOPY;  Service: Gastroenterology;;  . TEE WITHOUT CARDIOVERSION N/A 03/31/2016   Procedure: TRANSESOPHAGEAL ECHOCARDIOGRAM (TEE);  Surgeon: Larey Dresser, MD;  Location: [facility];  Service: Cardiovascular;  Laterality: N/A;        Home Medications    Prior to Admission medications   Medication Sig Start Date End Date Taking? Authorizing Provider  acetaminophen (TYLENOL) 500 MG tablet Take 1,000 mg by mouth every 6 (six)  hours as needed for mild pain or headache.    [provider]  amiodarone (PACERONE) 200 MG tablet Take 1 tablet twice a day for 1 month then reduce to 1 tablet a week 10/02/18   Fenton, Clint R, PA  amoxicillin-clavulanate (AUGMENTIN) 875-125 MG tablet Take 1 tablet by mouth every 12 (twelve) hours. 10/03/18   Tokiko Diefenderfer, Dellis Filbert, PA-C  atorvastatin (LIPITOR) 40 MG tablet Take 1 tablet (40 mg total) by mouth daily at 6 PM. 05/13/18   [name], FNP  hydrocortisone (ANUSOL-HC) 2.5 % rectal cream Place 1 application rectally 2 (two) times daily. 10/01/18   Ok Edwards, PA-C  hydrocortisone (ANUSOL-HC) 25 MG suppository Place 1 suppository (25 mg total) rectally 2 (two) times daily. 09/12/18   Aline August, MD  insulin aspart (NOVOLOG) 100 UNIT/ML injection Use 3 units three times a day with meals Patient taking differently: Inject 0-3 Units into the skin 3 (three) times daily with meals. Use 3 units three times a day with meals per sliding scale. 05/13/18 05/13/19  [name], FNP  insulin glargine (LANTUS) 100 UNIT/ML injection Inject 0.08 mLs (8 Units total) into the skin at bedtime as needed. 09/12/18   Aline August, MD  Insulin Syringe-Needle U-100 31G X 15/64" 0.3 ML MISC Use  to inject insulin 4 times a day 05/13/18   [name], FNP  metoprolol tartrate (LOPRESSOR) 25 MG tablet Take 0.5 tablets (12.5 mg total) by mouth 2 (two) times daily. 10/02/18   Fenton, Clint R, PA  multivitamin (RENA-VIT) TABS tablet TAKE 1 TABLET BY MOUTH AT BEDTIME. 07/29/18   [name], FNP  ondansetron (ZOFRAN) 4 MG tablet Take 1 tablet (4 mg total) by mouth every 6 (six) hours as needed for nausea. 09/12/18   Aline August, MD  polyethylene glycol (MIRALAX) 17 g packet Take 17 g by mouth daily. 10/01/18   Tasia Catchings, Amy V, PA-C  sevelamer carbonate (RENVELA) 800 MG tablet Take 3 tablets (2,400 mg total) by mouth 3 (three) times daily with meals. 09/12/18   Aline August, MD  warfarin (COUMADIN) 5 MG  tablet Take 1 tablet (5 mg total) by mouth daily at 6 PM. Hold Coumadin dose on 09/12/2018.  Start on 09/13/2018. 09/13/18   Aline August, MD    Family History Family History  Problem Relation Age of Onset  . Hypertension Mother   . Heart disease Mother   . Diabetes Mother     Social History Social History   Tobacco Use  . Smoking status: Never Smoker  . Smokeless tobacco: Never Used  . Tobacco comment: pt denies smoking cigars  Substance Use Topics  . Alcohol use: No  . Drug use: No     Allergies   No known allergies   Review of Systems Review of Systems  All other systems reviewed and are negative.    Physical Exam Updated Vital Signs BP 109/75 (BP Location: Right Arm)   Pulse (!) 109   Temp 98.2 F (36.8 C) (Oral)   Resp 18   Ht 6' (1.829 m)   Wt 94.3 kg   SpO2 99%   BMI 28.21 kg/m   Physical Exam Vitals signs and nursing note reviewed.  Constitutional:      Appearance: He is well-developed.  HENT:     Head: Normocephalic and atraumatic.  Eyes:     General: No scleral icterus.       Right eye: No discharge.        Left eye: No discharge.     Conjunctiva/sclera: Conjunctivae normal.     Pupils: Pupils are equal, round, and reactive to light.  Neck:     Musculoskeletal: Normal range of motion.     Vascular: No JVD.     Trachea: No tracheal deviation.  Pulmonary:     Effort: Pulmonary effort is normal.     Breath sounds: No stridor.  Genitourinary:      Comments: Rectum with 1.5 soft area of fluctuance no surrounding induration or overlying erythema Neurological:     Mental Status: He is alert and oriented to person, place, and time.     Coordination: Coordination normal.  Psychiatric:        Behavior: Behavior normal.        Thought Content: Thought content normal.        Judgment: Judgment normal.      ED Treatments / Results  Labs (all labs ordered are listed, but only abnormal results are displayed) Labs Reviewed  AEROBIC/ANAEROBIC  CULTURE (SURGICAL/DEEP WOUND)    EKG None  Radiology No results found.  Procedures .Marland KitchenIncision and Drainage  Date/Time: 10/03/2018 7:10 PM Performed by: Okey Regal, PA-C Authorized by: Okey Regal, PA-C   Consent:    Consent obtained:  Verbal   Consent given by:  Patient   Risks discussed:  Bleeding, damage to other organs, incomplete drainage, infection and pain   Alternatives discussed:  No treatment and observation Location:    Type:  Abscess   Size:  1.5   Location: Rectum. Pre-procedure details:    Skin preparation:  Chloraprep Anesthesia (see MAR for exact dosages):    Anesthesia method:  Local infiltration   Local anesthetic:  Lidocaine 2% WITH epi Procedure type:    Complexity:  Simple Procedure details:    Needle aspiration: yes     Needle size:  18 G   Drainage:  Purulent   Drainage amount:  Moderate   Packing materials:  None Post-procedure details:    Patient tolerance of procedure:  Tolerated well, no immediate complications   (including critical care time)  Medications Ordered in ED Medications  lidocaine-EPINEPHrine (XYLOCAINE W/EPI) 2 %-1:200000 (PF) injection 20 mL (20 mLs Infiltration Given 10/03/18 1625)  amoxicillin-clavulanate (AUGMENTIN) 875-125 MG per tablet 1 tablet (1 tablet Oral Given 10/03/18 1729)     Initial Impression / Assessment and Plan / ED Course  I have reviewed the triage vital signs and the nursing notes.  Pertinent labs & imaging results that were available during my care of the patient were reviewed by me and considered in my medical decision making (see chart for details).        Discharge Meds: Augmentin  Assessment/Plan: 48 year old male presents today with lesion to his rectum.  Differential included abscess versus hemorrhoid.  The external presentation did look like a hemorrhoid given that had no surrounding induration or overlying erythema.  Patient had no significant tenderness with palpation of this.   Area was anesthetized and an 18-gauge needle was used to aspirate which revealed purulent material mixed with blood.  This was a abscess.  This was very small superficial with no signs of deep space involvement.  Patient will need antibiotics, he will follow-up in 2 days for a wound check.  He will return immediately if he develops any new or worsening signs or symptoms.  He verbalized understanding and agreement to today's plan.  He was watched for bleeding as his INR was elevated at his last Coumadin clinic visit.  He had no subsequent bleeding after the procedure, he will inform the Coumadin clinic that he is taking antibiotics for this infection.  Patient verbalized understanding and agreement to today's plan and had no further questions or concerns at time of discharge.      Final Clinical Impressions(s) / ED Diagnoses   Final diagnoses:  Rectal abscess    ED Discharge Orders         Ordered    amoxicillin-clavulanate (AUGMENTIN) 875-125 MG tablet  Every 12 hours     10/03/18 1824           Okey Regal, PA-C 10/03/18 1911    Charlesetta Shanks, MD 10/08/18 1002

## 2018-10-03 NOTE — ED Triage Notes (Signed)
PT reports he could not afford the anti-bx .

## 2018-10-03 NOTE — ED Triage Notes (Signed)
Pt presents to ED with a "bump" his rectum.  Pt states that he was seen at urgent care and was prescribed cream and antibiotics.  Pt reports he got the cream but couldn't afford the antibiotics.  Pt reports soaking his bottom in warm water as recommended.  Pt reports his pain is 3/10 in triage.

## 2018-10-04 ENCOUNTER — Telehealth: Payer: Self-pay | Admitting: *Deleted

## 2018-10-04 LAB — AMIODARONE LEVEL
Amiodarone Lvl: 1.5 ug/mL (ref 1.0–2.5)
N-Desethyl-Amiodarone: 0.8 ug/mL — ABNORMAL LOW (ref 1.0–2.5)

## 2018-10-04 MED FILL — AMOX-CLAV 875-125 MG TABLET: 875-125 | 7 days supply | Qty: 14 | Fill #0

## 2018-10-04 NOTE — Telephone Encounter (Signed)
Pt called and stated he is now taking an Antibiotic called Augmentin for an abcess and advised that the med is safe to take with Coumadin. Pt then stated he cannot come to appt on Wednesday because he has to change HD days and come on Thursday for the appt, advised the pt of the risk he is taking and he verbalized understanding. Appt set per pt request.  Completed Covid Prescreen while on the phone since hard to reach the pt at times.  1. COVID-19 Pre-Screening Questions:  . In the past 7 to 10 days have you had a cough,  shortness of breath, headache, congestion, fever (100 or greater) body aches, chills, sore throat, or sudden loss of taste or sense of smell? No . Have you been around anyone with known Covid 19? No . Have you been around anyone who is awaiting Covid 19 test results in the past 7 to 10 days? No . Have you been around anyone who has been exposed to Covid 19, or has mentioned symptoms of Covid 19 within the past 7 to 10 days? No    2. Pt advised of visitor restrictions (no visitors allowed except if needed to conduct the visit). Also advised to arrive at appointment time and wear a mask.

## 2018-10-06 LAB — AEROBIC/ANAEROBIC CULTURE W GRAM STAIN (SURGICAL/DEEP WOUND)

## 2018-10-07 ENCOUNTER — Telehealth: Payer: Self-pay | Admitting: Emergency Medicine

## 2018-10-07 NOTE — Progress Notes (Signed)
ED Antimicrobial Stewardship Positive Culture Follow Up   Travis Bryan is an 48 y.o. male who presented to [facility] on 10/03/2018 with a chief complaint of  Chief Complaint  Patient presents with  . Recurrent Skin Infections    Recent Results (from the past 720 hour(s))  Blood Culture (routine x 2)     Status: None   Collection Time: 09/07/18  8:54 AM   Specimen: BLOOD RIGHT HAND  Result Value Ref Range Status   Specimen Description BLOOD RIGHT HAND  Final   Special Requests   Final    BOTTLES DRAWN AEROBIC AND ANAEROBIC Blood Culture adequate volume   Culture   Final    NO GROWTH 5 DAYS Performed at [facility], [address], [place], [place] [address]    Report Status 09/12/2018 FINAL  Final  SARS Coronavirus 2 (CEPHEID - Performed in [facility] hospital lab), Hosp Order     Status: None   Collection Time: 09/07/18  9:07 AM   Specimen: Nasopharyngeal Swab  Result Value Ref Range Status   SARS Coronavirus 2 NEGATIVE NEGATIVE Final    Comment: (NOTE) If result is NEGATIVE SARS-CoV-2 target nucleic acids are NOT DETECTED. The SARS-CoV-2 RNA is generally detectable in upper and lower  respiratory specimens during the acute phase of infection. The lowest  concentration of SARS-CoV-2 viral copies this assay can detect is 250  copies / mL. A negative result does not preclude SARS-CoV-2 infection  and should not be used as the sole basis for treatment or other  patient management decisions.  A negative result may occur with  improper specimen collection / handling, submission of specimen other  than nasopharyngeal swab, presence of viral mutation(s) within the  areas targeted by this assay, and inadequate number of viral copies  (<250 copies / mL). A negative result must be combined with clinical  observations, patient history, and epidemiological information. If result is POSITIVE SARS-CoV-2 target nucleic acids are DETECTED. The SARS-CoV-2 RNA is generally  detectable in upper and lower  respiratory specimens dur ing the acute phase of infection.  Positive  results are indicative of active infection with SARS-CoV-2.  Clinical  correlation with patient history and other diagnostic information is  necessary to determine patient infection status.  Positive results do  not rule out bacterial infection or co-infection with other viruses. If result is PRESUMPTIVE POSTIVE SARS-CoV-2 nucleic acids MAY BE PRESENT.   A presumptive positive result was obtained on the submitted specimen  and confirmed on repeat testing.  While 2019 novel coronavirus  (SARS-CoV-2) nucleic acids may be present in the submitted sample  additional confirmatory testing may be necessary for epidemiological  and / or clinical management purposes  to differentiate between  SARS-CoV-2 and other Sarbecovirus currently known to infect humans.  If clinically indicated additional testing with an alternate test  methodology [phone]) is advised. The SARS-CoV-2 RNA is generally  detectable in upper and lower respiratory sp ecimens during the acute  phase of infection. The expected result is Negative. Fact Sheet for Patients:  StrictlyIdeas.no Fact Sheet for Healthcare Providers: BankingDealers.co.za This test is not yet approved or cleared by the [place] FDA and has been authorized for detection and/or diagnosis of SARS-CoV-2 by FDA under an Emergency Use Authorization (EUA).  This EUA will remain in effect (meaning this test can be used) for the duration of the COVID-19 declaration under Section 564(b)(1) of the Act, 21 U.S.C. section 360bbb-3(b)(1), unless the authorization is terminated  or revoked sooner. Performed at [facility], [place] [address], [place], [place] [address]   Surgical pcr screen     Status: None   Collection Time: 09/07/18  3:36 PM   Specimen: Nasal Mucosa; Nasal Swab  Result Value Ref Range Status    MRSA, PCR NEGATIVE NEGATIVE Final   Staphylococcus aureus NEGATIVE NEGATIVE Final    Comment: (NOTE) The Xpert SA Assay (FDA approved for NASAL specimens in patients 76 years of age and older), is one component of a comprehensive surveillance program. It is not intended to diagnose infection nor to guide or monitor treatment. Performed at [facility], [place] [address], [place], [place] [address]   Gastrointestinal Panel by PCR , Stool     Status: None   Collection Time: 09/08/18  8:31 PM   Specimen: Stool  Result Value Ref Range Status   Campylobacter species NOT DETECTED NOT DETECTED Final   Plesimonas shigelloides NOT DETECTED NOT DETECTED Final   Salmonella species NOT DETECTED NOT DETECTED Final   Yersinia enterocolitica NOT DETECTED NOT DETECTED Final   Vibrio species NOT DETECTED NOT DETECTED Final   Vibrio cholerae NOT DETECTED NOT DETECTED Final   Enteroaggregative E coli (EAEC) NOT DETECTED NOT DETECTED Final   Enteropathogenic E coli (EPEC) NOT DETECTED NOT DETECTED Final   Enterotoxigenic E coli (ETEC) NOT DETECTED NOT DETECTED Final   Shiga like toxin producing E coli (STEC) NOT DETECTED NOT DETECTED Final   Shigella/Enteroinvasive E coli (EIEC) NOT DETECTED NOT DETECTED Final   Cryptosporidium NOT DETECTED NOT DETECTED Final   Cyclospora cayetanensis NOT DETECTED NOT DETECTED Final   Entamoeba histolytica NOT DETECTED NOT DETECTED Final   Giardia lamblia NOT DETECTED NOT DETECTED Final   Adenovirus F40/41 NOT DETECTED NOT DETECTED Final   Astrovirus NOT DETECTED NOT DETECTED Final   Norovirus GI/GII NOT DETECTED NOT DETECTED Final   Rotavirus A NOT DETECTED NOT DETECTED Final   Sapovirus (I, II, IV, and V) NOT DETECTED NOT DETECTED Final    Comment: Performed at [facility], [place] [place],  [address]  SARS Coronavirus 2 (Performed in [place] hospital lab)     Status: None   Collection Time: 09/24/18  1:35 PM   Specimen: Nasal  Swab  Result Value Ref Range Status   SARS Coronavirus 2 NEGATIVE NEGATIVE Final    Comment: (NOTE) SARS-CoV-2 target nucleic acids are NOT DETECTED. The SARS-CoV-2 RNA is generally detectable in upper and lower respiratory specimens during the acute phase of infection. Negative results do not preclude SARS-CoV-2 infection, do not rule out co-infections with other pathogens, and should not be used as the sole basis for treatment or other patient management decisions. Negative results must be combined with clinical observations, patient history, and epidemiological information. The expected result is Negative. Fact Sheet for Patients: SugarRoll.be Fact Sheet for Healthcare Providers: https://www.woods-mathews.com/ This test is not yet approved or cleared by the [place] FDA and  has been authorized for detection and/or diagnosis of SARS-CoV-2 by FDA under an Emergency Use Authorization (EUA). This EUA will remain  in effect (meaning this test can be used) for the duration of the COVID-19 declaration under Section 56 4(b)(1) of the Act, 21 U.S.C. section 360bbb-3(b)(1), unless the authorization is terminated or revoked sooner. Performed at [facility], [place] [address], [place],  [address]   Aerobic/Anaerobic Culture (surgical/deep wound)     Status: None   Collection Time: 10/03/18  5:32 PM  Specimen: Abscess  Result Value Ref Range Status   Specimen Description ABSCESS RECTUM  Final   Special Requests NONE  Final   Gram Stain   Final    ABUNDANT WBC PRESENT, PREDOMINANTLY PMN FEW GRAM NEGATIVE RODS FEW GRAM POSITIVE RODS RARE GRAM POSITIVE COCCI Performed at [facility], [place] [address], [place], [place] [address]    Culture   Final    MODERATE KLEBSIELLA PNEUMONIAE Confirmed Extended Spectrum Beta-Lactamase Producer (ESBL).  In bloodstream infections from ESBL organisms, carbapenems are preferred over  piperacillin/tazobactam. They are shown to have a lower risk of mortality. MIXED ANAEROBIC FLORA PRESENT.  CALL LAB IF FURTHER IID REQUIRED.    Report Status 10/06/2018 FINAL  Final   Organism ID, Bacteria KLEBSIELLA PNEUMONIAE  Final      Susceptibility   Klebsiella pneumoniae - MIC*    AMPICILLIN >=32 RESISTANT Resistant     CEFAZOLIN >=64 RESISTANT Resistant     CEFEPIME >=64 RESISTANT Resistant     CEFTAZIDIME >=64 RESISTANT Resistant     CEFTRIAXONE >=64 RESISTANT Resistant     CIPROFLOXACIN >=4 RESISTANT Resistant     GENTAMICIN <=1 SENSITIVE Sensitive     IMIPENEM <=0.25 SENSITIVE Sensitive     TRIMETH/SULFA 40 SENSITIVE Sensitive     AMPICILLIN/SULBACTAM >=32 RESISTANT Resistant     PIP/TAZO 64 INTERMEDIATE Intermediate     Extended ESBL POSITIVE Resistant     * MODERATE KLEBSIELLA PNEUMONIAE    [x]  Treated with Augmentin, organism resistant to prescribed antimicrobial  New antibiotic prescription: Flow Manager to call patient. If abscess improving (no further purulent drainage, etc) and patient feeling better, then no further treatment indicated and monitor. If abscess not improving, patient should follow-up with PCP for wound check.  ED Provider: Irena Cords, PA-C   Romona Curls 10/07/2018, 8:48 AM Clinical Pharmacist Monday - Friday phone -  [phone] Saturday - Sunday phone - [phone]

## 2018-10-07 NOTE — Telephone Encounter (Signed)
Post ED Visit - Positive Culture Follow-up  Culture report reviewed by antimicrobial stewardship pharmacist: [place] Team []  Elenor Quinones, Pharm.D. []  Heide Guile, Pharm.D., BCPS AQ-ID []  Parks Neptune, Pharm.D., BCPS []  Alycia Rossetti, Pharm.D., BCPS []  [place], [place].D., BCPS, AAHIVP []  Legrand Como, Pharm.D., BCPS, AAHIVP []  Salome Arnt, PharmD, BCPS []  Johnnette Gourd, PharmD, BCPS []  Hughes Better, PharmD, BCPS []  Leeroy Cha, PharmD []  Laqueta Linden, PharmD, BCPS []  Albertina Parr, PharmD Elicia Lamp PharmD  [place] Team []  Leodis Sias, PharmD []  Lindell Spar, PharmD []  Royetta Asal, PharmD []  Graylin Shiver, Rph []  Rema Fendt) Glennon Mac, PharmD []  Arlyn Dunning, PharmD []  Netta Cedars, PharmD []  Dia Sitter, PharmD []  Leone Haven, PharmD []  Gretta Arab, PharmD []  Theodis Shove, PharmD []  Peggyann Juba, PharmD []  Reuel Boom, PharmD   Positive wound culture Treated with augmentin, symptom check, states abcess is better and he is feeling better, advised if worse to followup with his PCP.  Hazle Nordmann 10/07/2018, 10:56 AM

## 2018-10-10 ENCOUNTER — Ambulatory Visit (INDEPENDENT_AMBULATORY_CARE_PROVIDER_SITE_OTHER): Payer: Medicaid Other | Admitting: *Deleted

## 2018-10-10 ENCOUNTER — Other Ambulatory Visit: Payer: Self-pay

## 2018-10-10 DIAGNOSIS — Z95810 Presence of automatic (implantable) cardiac defibrillator: Secondary | ICD-10-CM | POA: Diagnosis not present

## 2018-10-10 DIAGNOSIS — Z5181 Encounter for therapeutic drug level monitoring: Secondary | ICD-10-CM | POA: Diagnosis not present

## 2018-10-10 DIAGNOSIS — I4819 Other persistent atrial fibrillation: Secondary | ICD-10-CM

## 2018-10-10 DIAGNOSIS — I483 Typical atrial flutter: Secondary | ICD-10-CM

## 2018-10-10 LAB — POCT INR: INR: 2.6 (ref 2.0–3.0)

## 2018-10-10 NOTE — Patient Instructions (Addendum)
Description   Start taking taking 1 tablet daily except 1/2 tablet Sundays and Thursdays. Recheck INR in 11 days. Please call coumadin clinic with any changes in medications. [phone]

## 2018-10-21 MED FILL — AMIODARONE HCL 200 MG TAB: 200 | 30 days supply | Qty: 60 | Fill #0

## 2018-10-21 MED FILL — WARFARIN SODIUM 5 MG TABLET: 5 | 30 days supply | Qty: 30 | Fill #0

## 2018-10-22 ENCOUNTER — Ambulatory Visit (INDEPENDENT_AMBULATORY_CARE_PROVIDER_SITE_OTHER): Payer: Medicaid Other | Admitting: Pharmacist

## 2018-10-22 ENCOUNTER — Other Ambulatory Visit: Payer: Self-pay

## 2018-10-22 DIAGNOSIS — I483 Typical atrial flutter: Secondary | ICD-10-CM | POA: Diagnosis not present

## 2018-10-22 DIAGNOSIS — I4819 Other persistent atrial fibrillation: Secondary | ICD-10-CM | POA: Diagnosis not present

## 2018-10-22 DIAGNOSIS — Z5181 Encounter for therapeutic drug level monitoring: Secondary | ICD-10-CM | POA: Diagnosis not present

## 2018-10-22 DIAGNOSIS — Z95810 Presence of automatic (implantable) cardiac defibrillator: Secondary | ICD-10-CM | POA: Diagnosis not present

## 2018-10-22 LAB — POCT INR: INR: 3.8 — AB (ref 2.0–3.0)

## 2018-10-22 NOTE — Patient Instructions (Signed)
Description   Skip your Coumadin today, then start taking taking 1 tablet daily except 1/2 tablet Sundays, Tuesdays and Thursdays. Recheck INR in 2 weeks. Please call coumadin clinic with any changes in medications. [phone]

## 2018-10-24 ENCOUNTER — Ambulatory Visit (HOSPITAL_COMMUNITY): Payer: Medicaid Other | Admitting: Physician Assistant

## 2018-11-06 MED FILL — ATORVASTATIN 40 MG TABLET: 40 | 30 days supply | Qty: 30 | Fill #4

## 2018-11-06 MED FILL — METOPROLOL TARTRATE 25 MG T: 25 | 30 days supply | Qty: 30 | Fill #1

## 2018-11-07 ENCOUNTER — Encounter (HOSPITAL_COMMUNITY): Payer: Self-pay | Admitting: Physician Assistant

## 2018-11-07 ENCOUNTER — Ambulatory Visit (HOSPITAL_COMMUNITY)
Admission: RE | Admit: 2018-11-07 | Discharge: 2018-11-07 | Disposition: A | Discharge status: Home / Self Care | Payer: Medicare Other | Source: Ambulatory Visit | Attending: Physician Assistant | Admitting: Physician Assistant

## 2018-11-07 ENCOUNTER — Other Ambulatory Visit (HOSPITAL_COMMUNITY): Payer: Self-pay

## 2018-11-07 ENCOUNTER — Other Ambulatory Visit: Payer: Self-pay

## 2018-11-07 VITALS — BP 116/76 | HR 70 | Ht 72.0 in | Wt 206.4 lb

## 2018-11-07 DIAGNOSIS — I4819 Other persistent atrial fibrillation: Secondary | ICD-10-CM | POA: Diagnosis present

## 2018-11-07 DIAGNOSIS — R9431 Abnormal electrocardiogram [ECG] [EKG]: Secondary | ICD-10-CM | POA: Diagnosis not present

## 2018-11-07 DIAGNOSIS — Z8673 Personal history of transient ischemic attack (TIA), and cerebral infarction without residual deficits: Secondary | ICD-10-CM | POA: Insufficient documentation

## 2018-11-07 DIAGNOSIS — I4892 Unspecified atrial flutter: Secondary | ICD-10-CM | POA: Diagnosis not present

## 2018-11-07 DIAGNOSIS — Z7901 Long term (current) use of anticoagulants: Secondary | ICD-10-CM | POA: Insufficient documentation

## 2018-11-07 DIAGNOSIS — Z8249 Family history of ischemic heart disease and other diseases of the circulatory system: Secondary | ICD-10-CM | POA: Diagnosis not present

## 2018-11-07 DIAGNOSIS — N184 Chronic kidney disease, stage 4 (severe): Secondary | ICD-10-CM | POA: Diagnosis not present

## 2018-11-07 DIAGNOSIS — Z794 Long term (current) use of insulin: Secondary | ICD-10-CM | POA: Insufficient documentation

## 2018-11-07 DIAGNOSIS — Z833 Family history of diabetes mellitus: Secondary | ICD-10-CM | POA: Diagnosis not present

## 2018-11-07 DIAGNOSIS — E669 Obesity, unspecified: Secondary | ICD-10-CM | POA: Insufficient documentation

## 2018-11-07 DIAGNOSIS — E1122 Type 2 diabetes mellitus with diabetic chronic kidney disease: Secondary | ICD-10-CM | POA: Insufficient documentation

## 2018-11-07 DIAGNOSIS — E785 Hyperlipidemia, unspecified: Secondary | ICD-10-CM | POA: Insufficient documentation

## 2018-11-07 DIAGNOSIS — I5022 Chronic systolic (congestive) heart failure: Secondary | ICD-10-CM

## 2018-11-07 DIAGNOSIS — I130 Hypertensive heart and chronic kidney disease with heart failure and stage 1 through stage 4 chronic kidney disease, or unspecified chronic kidney disease: Secondary | ICD-10-CM | POA: Insufficient documentation

## 2018-11-07 DIAGNOSIS — I252 Old myocardial infarction: Secondary | ICD-10-CM | POA: Insufficient documentation

## 2018-11-07 DIAGNOSIS — Z6827 Body mass index (BMI) 27.0-27.9, adult: Secondary | ICD-10-CM | POA: Insufficient documentation

## 2018-11-07 DIAGNOSIS — I428 Other cardiomyopathies: Secondary | ICD-10-CM | POA: Diagnosis not present

## 2018-11-07 DIAGNOSIS — Z95810 Presence of automatic (implantable) cardiac defibrillator: Secondary | ICD-10-CM | POA: Insufficient documentation

## 2018-11-07 DIAGNOSIS — Z79899 Other long term (current) drug therapy: Secondary | ICD-10-CM | POA: Diagnosis not present

## 2018-11-07 DIAGNOSIS — I472 Ventricular tachycardia: Secondary | ICD-10-CM | POA: Insufficient documentation

## 2018-11-07 DIAGNOSIS — I447 Left bundle-branch block, unspecified: Secondary | ICD-10-CM | POA: Diagnosis not present

## 2018-11-07 LAB — T4, FREE: Free T4: 3.25 ng/dL — ABNORMAL HIGH (ref 0.61–1.12)

## 2018-11-07 LAB — TSH: TSH: <0.01 u[IU]/mL — ABNORMAL LOW (ref 0.350–4.500)

## 2018-11-07 MED ORDER — AMIODARONE HCL 200 MG PO TABS: 200.0000 mg | ORAL_TABLET | Freq: Every day | ORAL | 2 refills | Status: DC

## 2018-11-07 NOTE — Patient Instructions (Addendum)
Follow up with Dr. Aundra Dubin in 3 months  Reduce amiodarone 200mg  once a day

## 2018-11-07 NOTE — Progress Notes (Signed)
Primary Care Physician: [name], FNP Primary Cardiologist: Dr Aundra Dubin Primary Electrophysiologist: Dr Lovena Le Referring Physician: Dr Heide Spark is a 48 y.o. male with a history of persistent atrial fibrillation, atrial flutter, CKD Stage IV, HTN, NICM, chronic systolic heart failure, ST Jude ICD, and VT who presents for follow up in [facility]. Patient recently seen in ER for SOB on exertion and fatigue and was found to be in afib with RVR. He was started on metoprolol and discharged. He was also seen back in ER recently again for an abscess. He continues to have symptoms stating that he has trouble walking to his car without becoming fatigued.   Patient is s/p unsuccessful DCCV on 09/15/18. His amiodarone dose was increased at his prior visit and he has converted to SR. Patient notes that his SOB has improved and he is able to walk much farther without becoming winded.   Today, he denies symptoms of palpitations, chest pain, orthopnea, PND, lower extremity edema, dizziness, presyncope, syncope, snoring, daytime somnolence, bleeding, or neurologic sequela. The patient is tolerating medications without difficulties and is otherwise without complaint today. +fatigue   Atrial Fibrillation Risk Factors:  he does not have symptoms or diagnosis of sleep apnea. he does not have a history of rheumatic fever. he does not have a history of alcohol use. The patient does not have a history of early familial atrial fibrillation or other arrhythmias.  he has a BMI of Body mass index is 27.99 kg/m.Marland Kitchen Filed Weights   11/07/18 1426  Weight: 93.6 kg    Family History  Problem Relation Age of Onset  . Hypertension Mother   . Heart disease Mother   . Diabetes Mother      Atrial Fibrillation Management history:  Previous antiarrhythmic drugs: amiodarone Previous cardioversions: 2018, 09/25/18 Previous ablations: aflutter 03/2016 CHADS2VASC  score: 6 Anticoagulation history: warfarin   Past Medical History:  Diagnosis Date  . AICD (automatic cardioverter/defibrillator) present   . Atrial flutter ([place])    a. s/p ablation 03/2016  . Chronic kidney disease   . Chronic systolic CHF (congestive heart failure) ([place])   . HCAP (healthcare-associated pneumonia) 03/28/2018  . History of gout   . Hyperlipidemia   . Hypertension   . Myocardial infarction [facility])    "I think I had one a long long time ago" (03/28/2016)  . NICM (nonischemic cardiomyopathy) ([place])    a. LHC 6/06: pLAD 20, pLCx 20-30; b. Echo 5/15:  EF 15%, diffuse HK, restrictive physiology, trivial AI, trivial MR, mild LAE, moderate RVE, moderately reduced RVSF, moderate RAE, mild to moderate TR, PASP 43 mmHg  . Obesity   . Persistent atrial fibrillation   . Stroke ([place])   . Type II diabetes mellitus ([place])    Past Surgical History:  Procedure Laterality Date  . AV FISTULA PLACEMENT Left 04/25/2018   Procedure: ARTERIOVENOUS (AV) VS GRAFT ARM;  Surgeon: Serafina [facility], MD;  Location: [place];  Service: Vascular;  Laterality: Left;  . BASCILIC VEIN TRANSPOSITION Left 07/19/2018   Procedure: SECOND STAGE BASILIC VEIN TRANSPOSITION LEFT ARM;  Surgeon: Serafina [facility], MD;  Location: [place];  Service: Vascular;  Laterality: Left;  . CARDIOVERSION N/A 04/07/2016   Procedure: CARDIOVERSION;  Surgeon: Thayer Headings, MD;  Location: [facility] ENDOSCOPY;  Service: Cardiovascular;  Laterality: N/A;  . CARDIOVERSION N/A 04/11/2016   Procedure: CARDIOVERSION;  Surgeon: Larey Dresser, MD;  Location: [place];  Service: Cardiovascular;  Laterality: N/A;  . CARDIOVERSION N/A 09/25/2018   Procedure: CARDIOVERSION;  Surgeon: Buford Dresser, MD;  Location: [facility] ENDOSCOPY;  Service: Cardiovascular;  Laterality: N/A;  . COLONOSCOPY WITH PROPOFOL N/A 09/10/2018   Procedure: COLONOSCOPY WITH PROPOFOL;  Surgeon: Otis Brace, MD;  Location: [facility];  Service: Gastroenterology;   Laterality: N/A;  . ELECTROPHYSIOLOGIC STUDY N/A 03/31/2016   Procedure: A-Flutter Ablation;  Surgeon: Evans Lance, MD;  Location: [facility] CV LAB;  Service: Cardiovascular;  Laterality: N/A;  . ESOPHAGOGASTRODUODENOSCOPY (EGD) WITH PROPOFOL N/A 09/10/2018   Procedure: ESOPHAGOGASTRODUODENOSCOPY (EGD) WITH PROPOFOL;  Surgeon: Otis Brace, MD;  Location: [place];  Service: Gastroenterology;  Laterality: N/A;  . EXCHANGE OF A DIALYSIS CATHETER Left 04/25/2018   Procedure: Attempted Exchange Of A Dialysis Catheter on the Left side;  Surgeon: Seraf[facility], MD;  Location: [facility] OR;  Service: Vascular;  Laterality: Left;  . IMPLANTABLE CARDIOVERTER DEFIBRILLATOR (ICD) GENERATOR CHANGE N/A 08/27/2013   Procedure: ICD GENERATOR CHANGE;  Surgeon: Evans Lance, MD;  Location: [facility] CATH LAB;  Service: Cardiovascular;  Laterality: N/A;  . INSERTION OF DIALYSIS CATHETER Right 04/25/2018   Procedure: INSERTION OF TUNNELED DIALYSIS CATHETER;  Surgeon: Seraf[facility], MD;  Location: [place];  Service: Vascular;  Laterality: Right;  . POLYPECTOMY  09/10/2018   Procedure: POLYPECTOMY;  Surgeon: Otis Brace, MD;  Location: [place] ENDOSCOPY;  Service: Gastroenterology;;  . TEE WITHOUT CARDIOVERSION N/A 03/31/2016   Procedure: TRANSESOPHAGEAL ECHOCARDIOGRAM (TEE);  Surgeon: Larey Dresser, MD;  Location: [facility] ENDOSCOPY;  Service: Cardiovascular;  Laterality: N/A;    Current Outpatient Medications  Medication Sig Dispense Refill  . acetaminophen (TYLENOL) 500 MG tablet Take 1,000 mg by mouth every 6 (six) hours as needed for mild pain or headache.    Marland Kitchen amiodarone (PACERONE) 200 MG tablet Take 1 tablet (200 mg total) by mouth daily. 60 tablet 2  . atorvastatin (LIPITOR) 40 MG tablet Take 1 tablet (40 mg total) by mouth daily at 6 PM. 90 tablet 2  . insulin aspart (NOVOLOG) 100 UNIT/ML injection Use 3 units three times a day with meals (Patient taking differently: Inject 0-3 Units into the skin 3 (three)  times daily with meals. Use 3 units three times a day with meals per sliding scale.) 10 mL 3  . insulin glargine (LANTUS) 100 UNIT/ML injection Inject 0.08 mLs (8 Units total) into the skin at bedtime as needed. 10 mL 5  . Insulin Syringe-Needle U-100 31G X 15/64" 0.3 ML MISC Use to inject insulin 4 times a day 200 each 12  . metoprolol tartrate (LOPRESSOR) 25 MG tablet Take 0.5 tablets (12.5 mg total) by mouth 2 (two) times daily. 30 tablet 3  . sevelamer carbonate (RENVELA) 800 MG tablet Take 3 tablets (2,400 mg total) by mouth 3 (three) times daily with meals. 90 tablet 2  . warfarin (COUMADIN) 5 MG tablet Take 1 tablet (5 mg total) by mouth daily at 6 PM. Hold Coumadin dose on 09/12/2018.  Start on 09/13/2018. 30 tablet 0  . amoxicillin-clavulanate (AUGMENTIN) 875-125 MG tablet Take 1 tablet by mouth every 12 (twelve) hours. (Patient not taking: Reported on 11/07/2018) 14 tablet 0   No current facility-administered medications for this encounter.     Allergies  Allergen Reactions  . No Known Allergies     Social History   Socioeconomic History  . Marital status: Widowed    Spouse name: Not on file  . Number of children: Not on file  . Years of education:  Not on file  . Highest education level: Not on file  Occupational History  . Not on file  Social Needs  . Financial resource strain: Not on file  . Food insecurity    Worry: Not on file    Inability: Not on file  . Transportation needs    Medical: Not on file    Non-medical: Not on file  Tobacco Use  . Smoking status: Never Smoker  . Smokeless tobacco: Never Used  . Tobacco comment: pt denies smoking cigars  Substance and Sexual Activity  . Alcohol use: No  . Drug use: No  . Sexual activity: Not Currently  Lifestyle  . Physical activity    Days per week: Not on file    Minutes per session: Not on file  . Stress: Not on file  Relationships  . Social Herbalist on phone: Not on file    Gets together: Not on  file    Attends religious service: Not on file    Active member of club or organization: Not on file    Attends meetings of clubs or organizations: Not on file    Relationship status: Not on file  . Intimate partner violence    Fear of current or ex partner: Not on file    Emotionally abused: Not on file    Physically abused: Not on file    Forced sexual activity: Not on file  Other Topics Concern  . Not on file  Social History Narrative  . Not on file     ROS- All systems are reviewed and negative except as per the HPI above.  Physical Exam: Vitals:   11/07/18 1426  BP: 116/76  Pulse: 70  Weight: 93.6 kg  Height: 6' (1.829 m)    GEN- The patient is well appearing, alert and oriented x 3 today.   HEENT-head normocephalic, atraumatic, sclera clear, conjunctiva pink, hearing intact, trachea midline. Lungs- Clear to ausculation bilaterally, normal work of breathing Heart- Regular rate and rhythm, no murmurs, rubs or gallops  GI- soft, NT, ND, + BS Extremities- no clubbing, cyanosis, or edema MS- no significant deformity or atrophy Skin- no rash or lesion Psych- euthymic mood, full affect Neuro- strength and sensation are intact    Wt Readings from Last 3 Encounters:  11/07/18 93.6 kg  10/03/18 94.3 kg  10/02/18 92 kg    EKG today demonstrates SR HR 70 1st degree AV block, LBBB, PR 254, QRS 190, QTc 557  Echo 02/27/18 demonstrated  - Left ventricle: Diffuse hypokinesis worse in the inferior wall.   The cavity size was moderately dilated. Wall thickness was   increased in a pattern of mild LVH. Systolic function was   severely reduced. The estimated ejection fraction was in the   range of 25% to 30%. Doppler parameters are consistent with both   elevated ventricular end-diastolic filling pressure and elevated   left atrial filling pressure. - Left atrium: The atrium was mildly dilated. - Atrial septum: No defect or patent foramen ovale was identified.  Epic  records are reviewed at length today  Assessment and Plan:  1. Persistent atrial fibrillation Patient s/p unsuccessful DCCV 09/25/18. Patient converted to SR on higher dose of amiodarone. Unfortunately, patient's TSH and free T4 were abnormal today. Will decrease amiodarone to 200 mg daily. Hesitant to stop medication given that he also takes it for h/o VF/VT. Will refer him to endocrinology and forward to PCP. Continue Lopressor 25 mg BID Continue  warfarin.   This patients CHA2DS2-VASc Score and unadjusted Ischemic Stroke Rate (% per year) is equal to 9.7 % stroke rate/year from a score of 6  Above score calculated as 1 point each if present [CHF, HTN, DM, Vascular=MI/PAD/Aortic Plaque, Age if 65-74, or Male] Above score calculated as 2 points each if present [Age > 75, or Stroke/TIA/TE]   2. Overweight Body mass index is 27.99 kg/m. Lifestyle modification was discussed and encouraged including regular physical activity and weight reduction.  3. VF/VT S/p ICD, followed by device clinic and Dr Lovena Le. Amiodarone as above.  4. Chronic systolic CHF EF [id number]. No signs or symptoms of fluid overload.    Follow up with Dr Aundra Dubin in 3 months. AF clinic in 6 months.    [facility] [address] [place], [place] [address] [phone] 11/08/2018 8:42 AM

## 2018-11-08 LAB — T3, FREE: T3, Free: 3.3 pg/mL (ref 2.0–4.4)

## 2018-11-25 MED FILL — WARFARIN SODIUM 5 MG TABLET: 5 | 28 days supply | Qty: 22 | Fill #3

## 2018-11-26 ENCOUNTER — Other Ambulatory Visit: Payer: Self-pay

## 2018-11-26 ENCOUNTER — Ambulatory Visit (INDEPENDENT_AMBULATORY_CARE_PROVIDER_SITE_OTHER): Payer: Medicaid Other | Admitting: *Deleted

## 2018-11-26 DIAGNOSIS — I4819 Other persistent atrial fibrillation: Secondary | ICD-10-CM

## 2018-11-26 DIAGNOSIS — I480 Paroxysmal atrial fibrillation: Secondary | ICD-10-CM | POA: Diagnosis not present

## 2018-11-26 DIAGNOSIS — Z5181 Encounter for therapeutic drug level monitoring: Secondary | ICD-10-CM

## 2018-11-26 DIAGNOSIS — Z95810 Presence of automatic (implantable) cardiac defibrillator: Secondary | ICD-10-CM

## 2018-11-26 LAB — POCT INR: INR: 2 (ref 2.0–3.0)

## 2018-11-26 NOTE — Patient Instructions (Signed)
Description   Continue taking taking 1 tablet daily except 1/2 tablet Sundays, Tuesdays and Thursdays. Recheck INR in 3 weeks. Please call coumadin clinic with any changes in medications. [phone]

## 2018-11-28 ENCOUNTER — Telehealth: Payer: Self-pay

## 2018-11-28 NOTE — Telephone Encounter (Signed)
Called patient to do their pre-visit COVID screening.  Call went to voicemail. Unable to do prescreening.  

## 2018-11-29 ENCOUNTER — Ambulatory Visit: Payer: Medicaid Other | Admitting: Family Medicine

## 2018-12-02 ENCOUNTER — Ambulatory Visit: Payer: Medicaid Other

## 2018-12-12 MED FILL — ATORVASTATIN CALCIUM 40 MG: 40 | 30 days supply | Qty: 30 | Fill #5

## 2018-12-12 MED FILL — SEVELAMER CARBONATE 800 MG: 800 | 30 days supply | Qty: 90 | Fill #2

## 2018-12-12 MED FILL — METOPROLOL TARTRATE 25 MG T: 25 | 30 days supply | Qty: 30 | Fill #2

## 2018-12-12 MED FILL — AMIODARONE HCL 200 MG TAB: 200 | 30 days supply | Qty: 60 | Fill #1

## 2018-12-18 ENCOUNTER — Encounter: Payer: Medicaid Other | Admitting: *Deleted

## 2018-12-23 ENCOUNTER — Telehealth: Payer: Self-pay

## 2018-12-23 NOTE — Telephone Encounter (Signed)
Called patient to do their pre-visit COVID screening.  Call went to voicemail. Unable to do prescreening.  

## 2018-12-24 ENCOUNTER — Ambulatory Visit (INDEPENDENT_AMBULATORY_CARE_PROVIDER_SITE_OTHER): Payer: Medicaid Other | Admitting: *Deleted

## 2018-12-24 ENCOUNTER — Ambulatory Visit (INDEPENDENT_AMBULATORY_CARE_PROVIDER_SITE_OTHER): Payer: Medicare Other | Admitting: Internal Medicine

## 2018-12-24 ENCOUNTER — Other Ambulatory Visit: Payer: Self-pay

## 2018-12-24 VITALS — BP 118/77 | HR 58 | Temp 97.3°F | Resp 17 | Wt 206.0 lb

## 2018-12-24 DIAGNOSIS — E785 Hyperlipidemia, unspecified: Secondary | ICD-10-CM

## 2018-12-24 DIAGNOSIS — E1159 Type 2 diabetes mellitus with other circulatory complications: Secondary | ICD-10-CM

## 2018-12-24 DIAGNOSIS — I25810 Atherosclerosis of coronary artery bypass graft(s) without angina pectoris: Secondary | ICD-10-CM

## 2018-12-24 DIAGNOSIS — Z5181 Encounter for therapeutic drug level monitoring: Secondary | ICD-10-CM

## 2018-12-24 DIAGNOSIS — I4819 Other persistent atrial fibrillation: Secondary | ICD-10-CM

## 2018-12-24 DIAGNOSIS — Z95810 Presence of automatic (implantable) cardiac defibrillator: Secondary | ICD-10-CM

## 2018-12-24 DIAGNOSIS — Z992 Dependence on renal dialysis: Secondary | ICD-10-CM

## 2018-12-24 DIAGNOSIS — R599 Enlarged lymph nodes, unspecified: Secondary | ICD-10-CM

## 2018-12-24 DIAGNOSIS — Z2821 Immunization not carried out because of patient refusal: Secondary | ICD-10-CM

## 2018-12-24 DIAGNOSIS — I5022 Chronic systolic (congestive) heart failure: Secondary | ICD-10-CM

## 2018-12-24 DIAGNOSIS — R590 Localized enlarged lymph nodes: Secondary | ICD-10-CM | POA: Diagnosis not present

## 2018-12-24 DIAGNOSIS — R932 Abnormal findings on diagnostic imaging of liver and biliary tract: Secondary | ICD-10-CM

## 2018-12-24 DIAGNOSIS — I10 Essential (primary) hypertension: Secondary | ICD-10-CM | POA: Diagnosis not present

## 2018-12-24 DIAGNOSIS — I483 Typical atrial flutter: Secondary | ICD-10-CM | POA: Diagnosis not present

## 2018-12-24 DIAGNOSIS — Z794 Long term (current) use of insulin: Secondary | ICD-10-CM

## 2018-12-24 DIAGNOSIS — N186 End stage renal disease: Secondary | ICD-10-CM | POA: Insufficient documentation

## 2018-12-24 LAB — POCT INR: INR: 1.6 — AB (ref 2.0–3.0)

## 2018-12-24 MED ORDER — WARFARIN SODIUM 5 MG PO TABS: ORAL_TABLET | ORAL | 2 refills | Status: DC

## 2018-12-24 MED FILL — WARFARIN SODIUM 5 MG TABLET: 5 | 30 days supply | Qty: 30 | Fill #0

## 2018-12-24 NOTE — Progress Notes (Signed)
Patient ID: Travis Bryan, male    DOB: 05-29-1970  MRN: [id number]  CC: Diabetes, Hypertension, and Hyperlipidemia   Subjective: Travis Bryan is a 48 y.o. male who presents for chronic ds management at [facility] His concerns today include:  Patient with history of HTN, HL, CAD with AICD, sys CHF, obesity, A. fib, DM, CVA, ESRD on HD  DIABETES TYPE 2 Last A1C:   Results for orders placed or performed in visit on 12/24/18  POCT INR  Result Value Ref Range   INR 1.6 (A) 2.0 - 3.0   Last A1C was 6.6 in 08/2018 Med Adherence:  Suppose to be on Lantus 8 and Novolog 3 units with meals but not taking either "unless I feel sick or whoozy.  I stay pretty active and moving."  He feels he does not need the insulin.  He does not check his blood sugars but he does have diabetic testing supplies. Medication side effects:  []  Yes    []  No Home Monitoring?  []  Yes    [x]  No Home glucose results range: Diet Adherence: Drinks sweet tea and regular sodas   Exercise: []  Yes    []  No Hypoglycemic episodes?: []  Yes    [x]  No Numbness of the feet? []  Yes    [x]  No Retinopathy hx? []  Yes    []  No Last eye exam: no blurred vision.  Never had eye exam in past.  Agreeable to referral Comments:   A.fib:  Reports INR checked through cardiology.  Level today was 1.6.  He tells me his Coumadin was adjusted and he has a follow-up visit again in 2 weeks for recheck. No bruising or bleeding.  He is on amiodarone.  HTN/CAD/sysCHF: No CP, SOB, LE edema or shocks from AICD Saw cardiology about 2-3 mths ago  ESRD: GOES TO HD ON M/W/F  Patient hospitalized back in July with sepsis.  CT of the abdomen revealed findings in the liver suggestive of cirrhosis.  He also had present large bowel mesenteric and retroperitoneal lymph nodes.  Radiologist recommended repeat CAT scan in 3 months to ensure resolution.  Patient denies any fever or night sweats.  He denies any major weight changes.  In regards to the liver he  does not drink alcohol. Patient Active Problem List   Diagnosis Date Noted  . Severe sepsis ([place]) 09/07/2018  . Rectal bleed 09/07/2018  . Acute blood loss anemia 09/07/2018  . Hypotension 09/07/2018  . Acute renal failure superimposed on stage 4 chronic kidney disease ([place]) 05/01/2018  . Debility 04/26/2018  . Hypoxia   . Post-operative state   . Anemia of chronic disease   . PAF (paroxysmal atrial fibrillation) ([place])   . Pressure injury of skin 04/17/2018  . Bilateral pneumonia 03/29/2018  . Acute respiratory failure with hypoxia ([place]) 03/28/2018  . DKA (diabetic ketoacidosis) ([place]) 02/25/2018  . Acute on chronic systolic heart failure, NYHA class 4 ([place]) 02/25/2018  . Hyperlipidemia 02/25/2018  . Atrial fibrillation ([place]) 02/25/2018  . CAD (coronary artery disease) 02/25/2018  . Elevated troponin 02/25/2018  . Sepsis due to pneumonia ([place]) 02/25/2018  . AKI (acute kidney injury) ([place]) 02/25/2018  . Type 2 diabetes mellitus with complication, with long-term current use of insulin ([place]) 12/26/2017  . At risk for sexually transmitted disease due to unprotected sex 08/23/2017  . Healthcare maintenance 06/24/2016  . Persistent atrial fibrillation ([place])   . CARDIOMYOPATHY, SECONDARY 07/03/2008  . HFrEF (heart failure with reduced ejection fraction) ([place]) 07/03/2008  .  Automatic implantable cardioverter-defibrillator in situ 07/03/2008  . Type 2 diabetes mellitus ([place]) 12/26/2005  . Hyperlipidemia associated with type 2 diabetes mellitus ([place]) 12/26/2005  . Hypertension associated with diabetes ([place]) 12/26/2005     Current Outpatient Medications on File Prior to Visit  Medication Sig Dispense Refill  . amiodarone (PACERONE) 200 MG tablet Take 1 tablet (200 mg total) by mouth daily. 60 tablet 2  . atorvastatin (LIPITOR) 40 MG tablet Take 1 tablet (40 mg total) by mouth daily at 6 PM. 90 tablet 2  . insulin aspart (NOVOLOG) 100 UNIT/ML injection Use 3 units three times a day with  meals (Patient taking differently: Inject 0-3 Units into the skin 3 (three) times daily with meals. Use 3 units three times a day with meals per sliding scale.) 10 mL 3  . insulin glargine (LANTUS) 100 UNIT/ML injection Inject 0.08 mLs (8 Units total) into the skin at bedtime as needed. 10 mL 5  . Insulin Syringe-Needle U-100 31G X 15/64" 0.3 ML MISC Use to inject insulin 4 times a day 200 each 12  . metoprolol tartrate (LOPRESSOR) 25 MG tablet Take 0.5 tablets (12.5 mg total) by mouth 2 (two) times daily. 30 tablet 3  . sevelamer carbonate (RENVELA) 800 MG tablet Take 3 tablets (2,400 mg total) by mouth 3 (three) times daily with meals. 90 tablet 2   No current facility-administered medications on file prior to visit.     Allergies  Allergen Reactions  . No Known Allergies     Social History   Socioeconomic History  . Marital status: Widowed    Spouse name: Not on file  . Number of children: Not on file  . Years of education: Not on file  . Highest education level: Not on file  Occupational History  . Not on file  Social Needs  . Financial resource strain: Not on file  . Food insecurity    Worry: Not on file    Inability: Not on file  . Transportation needs    Medical: Not on file    Non-medical: Not on file  Tobacco Use  . Smoking status: Never Smoker  . Smokeless tobacco: Never Used  . Tobacco comment: pt denies smoking cigars  Substance and Sexual Activity  . Alcohol use: No  . Drug use: No  . Sexual activity: Not Currently  Lifestyle  . Physical activity    Days per week: Not on file    Minutes per session: Not on file  . Stress: Not on file  Relationships  . Social Herbalist on phone: Not on file    Gets together: Not on file    Attends religious service: Not on file    Active member of club or organization: Not on file    Attends meetings of clubs or organizations: Not on file    Relationship status: Not on file  . Intimate partner violence     Fear of current or ex partner: Not on file    Emotionally abused: Not on file    Physically abused: Not on file    Forced sexual activity: Not on file  Other Topics Concern  . Not on file  Social History Narrative  . Not on file    Family History  Problem Relation Age of Onset  . Hypertension Mother   . Heart disease Mother   . Diabetes Mother     Past Surgical History:  Procedure Laterality Date  . AV FISTULA PLACEMENT  Left 04/25/2018   Procedure: ARTERIOVENOUS (AV) VS GRAFT ARM;  Surgeon: Serafina Mitchell, MD;  Location: [facility] OR;  Service: Vascular;  Laterality: Left;  . BASCILIC VEIN TRANSPOSITION Left 07/19/2018   Procedure: SECOND STAGE BASILIC VEIN TRANSPOSITION LEFT ARM;  Surgeon: Serafina Mitchell, MD;  Location: [facility];  Service: Vascular;  Laterality: Left;  . CARDIOVERSION N/A 04/07/2016   Procedure: CARDIOVERSION;  Surgeon: Thayer Headings, MD;  Location: [facility];  Service: Cardiovascular;  Laterality: N/A;  . CARDIOVERSION N/A 04/11/2016   Procedure: CARDIOVERSION;  Surgeon: Larey Dresser, MD;  Location: [facility];  Service: Cardiovascular;  Laterality: N/A;  . CARDIOVERSION N/A 09/25/2018   Procedure: CARDIOVERSION;  Surgeon: Buford Dresser, MD;  Location: [facility] ENDOSCOPY;  Service: Cardiovascular;  Laterality: N/A;  . COLONOSCOPY WITH PROPOFOL N/A 09/10/2018   Procedure: COLONOSCOPY WITH PROPOFOL;  Surgeon: Otis Brace, MD;  Location: [facility];  Service: Gastroenterology;  Laterality: N/A;  . ELECTROPHYSIOLOGIC STUDY N/A 03/31/2016   Procedure: A-Flutter Ablation;  Surgeon: Evans Lance, MD;  Location: [facility] CV LAB;  Service: Cardiovascular;  Laterality: N/A;  . ESOPHAGOGASTRODUODENOSCOPY (EGD) WITH PROPOFOL N/A 09/10/2018   Procedure: ESOPHAGOGASTRODUODENOSCOPY (EGD) WITH PROPOFOL;  Surgeon: Otis Brace, MD;  Location: [facility];  Service: Gastroenterology;  Laterality: N/A;  . EXCHANGE OF A DIALYSIS CATHETER Left 04/25/2018   Procedure:  Attempted Exchange Of A Dialysis Catheter on the Left side;  Surgeon: Serafina Mitchell, MD;  Location: [facility] OR;  Service: Vascular;  Laterality: Left;  . IMPLANTABLE CARDIOVERTER DEFIBRILLATOR (ICD) GENERATOR CHANGE N/A 08/27/2013   Procedure: ICD GENERATOR CHANGE;  Surgeon: Evans Lance, MD;  Location: [facility] CATH LAB;  Service: Cardiovascular;  Laterality: N/A;  . INSERTION OF DIALYSIS CATHETER Right 04/25/2018   Procedure: INSERTION OF TUNNELED DIALYSIS CATHETER;  Surgeon: Serafina Mitchell, MD;  Location: [facility];  Service: Vascular;  Laterality: Right;  . POLYPECTOMY  09/10/2018   Procedure: POLYPECTOMY;  Surgeon: Otis Brace, MD;  Location: [facility] ENDOSCOPY;  Service: Gastroenterology;;  . TEE WITHOUT CARDIOVERSION N/A 03/31/2016   Procedure: TRANSESOPHAGEAL ECHOCARDIOGRAM (TEE);  Surgeon: Larey Dresser, MD;  Location: [facility] ENDOSCOPY;  Service: Cardiovascular;  Laterality: N/A;    ROS: Review of Systems Negative except as stated above  PHYSICAL EXAM: BP 118/77   Pulse (!) 58   Temp (!) 97.3 F (36.3 C) (Temporal)   Resp 17   Wt 206 lb (93.4 kg)   SpO2 95%   BMI 27.94 kg/m   Wt Readings from Last 3 Encounters:  12/24/18 206 lb (93.4 kg)  11/07/18 206 lb 6.4 oz (93.6 kg)  10/03/18 208 lb (94.3 kg)    Physical Exam General appearance - alert, well appearing, middle-aged older African-American male and in no distress Mental status - normal mood, behavior, speech, dress, motor activity, and thought processes Chest - clear to auscultation, no wheezes, rales or rhonchi, symmetric air entry Heart -bradycardia with systolic ejection murmur 2 out of 6 heard best along the left upper sternal border.  Abdomen - soft, nontender, nondistended, no masses or organomegaly Extremities -no lower extremity edema.   Diabetic Foot Exam - Simple   Simple Foot Form Visual Inspection No deformities, no ulcerations, no other skin breakdown bilaterally: Yes Sensation Testing Intact to touch and  monofilament testing bilaterally: Yes Pulse Check Posterior Tibialis and Dorsalis pulse intact bilaterally: Yes Comments Significant spider varicosities on the dorsal surface of both feet     CMP Latest Ref Rng & Units 09/25/2018 09/25/2018 09/12/2018  Glucose 70 - 99 mg/dL 101(H) 107(H) 91  BUN 6 - 20 mg/dL - - 32(H)  Creatinine 0.61 - 1.24 mg/dL - - 10.77(H)  Sodium 135 - 145 mmol/L 137 137 139  Potassium 3.5 - 5.1 mmol/L 5.7(H) 5.9(H) 3.3(L)  Chloride 98 - 111 mmol/L - - 104  CO2 22 - 32 mmol/L - - 22  Calcium 8.9 - 10.3 mg/dL - - 9.0  Total Protein 6.5 - 8.1 g/dL - - -  Total Bilirubin 0.3 - 1.2 mg/dL - - -  Alkaline Phos 38 - 126 U/L - - -  AST 15 - 41 U/L - - -  ALT 0 - 44 U/L - - -   Lipid Panel     Component Value Date/Time   CHOL 168 05/29/2018 1520   TRIG 206 (H) 05/29/2018 1520   HDL 44 05/29/2018 1520   CHOLHDL 3.8 05/29/2018 1520   CHOLHDL 5.0 Ratio 04/13/2009 1945   VLDL 21 04/13/2009 1945   LDLCALC 83 05/29/2018 1520    CBC    Component Value Date/Time   WBC 4.5 09/12/2018 0353   RBC 2.77 (L) 09/12/2018 0353   HGB 11.9 (L) 09/25/2018 1259   HGB 9.8 (L) 05/13/2018 1622   HCT 35.0 (L) 09/25/2018 1259   HCT 29.6 (L) 05/13/2018 1622   PLT 174 09/12/2018 0353   PLT 323 05/13/2018 1622   MCV 95.3 09/12/2018 0353   MCV 88 05/13/2018 1622   MCH 29.2 09/12/2018 0353   MCHC 30.7 09/12/2018 0353   RDW 16.2 (H) 09/12/2018 0353   RDW 14.9 05/13/2018 1622   LYMPHSABS 1.1 09/12/2018 0353   LYMPHSABS 1.4 05/13/2018 1622   MONOABS 0.5 09/12/2018 0353   EOSABS 0.2 09/12/2018 0353   EOSABS 0.5 (H) 05/13/2018 1622   BASOSABS 0.0 09/12/2018 0353   BASOSABS 0.1 05/13/2018 1622    ASSESSMENT AND PLAN:  1. Type 2 diabetes mellitus with other circulatory complication, with long-term current use of insulin ([place]) Advised patient to check blood sugars at least twice a day to make sure that his blood sugars are staying well controlled since he is not taking the insulin.   Encouraged him to stay active. - Ambulatory referral to Ophthalmology - Hemoglobin A1c  2. Chronic systolic heart failure (HCC) Compensated at this time.  3. Coronary artery disease involving coronary bypass graft of native heart without angina pectoris Stable.  4. Retroperitoneal lymphadenopathy - CT Abdomen Pelvis Wo Contrast; Future  5. Influenza vaccination declined   6. Abnormal finding on imaging of liver - Hepatic Function Panel - Hepatitis C Antibody  7.  ESRD on hemodialysis   Patient was given the opportunity to ask questions.  Patient verbalized understanding of the plan and was able to repeat key elements of the plan.   Orders Placed This Encounter  Procedures  . CT Abdomen Pelvis Wo Contrast  . Hemoglobin A1c  . Hepatic Function Panel  . Hepatitis C Antibody  . Ambulatory referral to Ophthalmology     Requested Prescriptions    No prescriptions requested or ordered in this encounter    Return in about 3 months (around 03/26/2019).  Karle Plumber, MD, FACP

## 2018-12-24 NOTE — Patient Instructions (Signed)
Description   Take 1 tablet today and 1.5 tablets tomorrow, then start taking 1 tablet daily except 1/2 tablet Sundays and Thursdays. Recheck INR in 2 weeks. Please call coumadin clinic with any changes in medications. [phone]

## 2018-12-24 NOTE — Progress Notes (Signed)
Doesn't check FSBS at home. Denies nausea, vomiting, numbness/tingling in feet, polyuria, polydipsia.

## 2018-12-25 ENCOUNTER — Telehealth: Payer: Self-pay | Admitting: Gastroenterology

## 2018-12-25 ENCOUNTER — Other Ambulatory Visit: Payer: Self-pay | Admitting: Internal Medicine

## 2018-12-25 ENCOUNTER — Telehealth: Payer: Self-pay

## 2018-12-25 DIAGNOSIS — R945 Abnormal results of liver function studies: Secondary | ICD-10-CM

## 2018-12-25 DIAGNOSIS — R932 Abnormal findings on diagnostic imaging of liver and biliary tract: Secondary | ICD-10-CM

## 2018-12-25 DIAGNOSIS — R7989 Other specified abnormal findings of blood chemistry: Secondary | ICD-10-CM

## 2018-12-25 LAB — HEPATIC FUNCTION PANEL
ALT: 13 IU/L (ref 0–44)
AST: 18 IU/L (ref 0–40)
Albumin: 3.4 g/dL — ABNORMAL LOW (ref 4.0–5.0)
Alkaline Phosphatase: 223 IU/L — ABNORMAL HIGH (ref 39–117)
Bilirubin Total: 1.4 mg/dL — ABNORMAL HIGH (ref 0.0–1.2)
Bilirubin, Direct: 0.44 mg/dL — ABNORMAL HIGH (ref 0.00–0.40)
Total Protein: 6.3 g/dL (ref 6.0–8.5)

## 2018-12-25 LAB — HEMOGLOBIN A1C
Est. average glucose Bld gHb Est-mCnc: 128 mg/dL
Hgb A1c MFr Bld: 6.1 % — ABNORMAL HIGH (ref 4.8–5.6)

## 2018-12-25 LAB — HEPATITIS C ANTIBODY: Hep C Virus Ab: <0.1 s/co ratio (ref 0.0–0.9)

## 2018-12-25 NOTE — Telephone Encounter (Signed)
Hey Dr. Ardis Hughs- This patient Travis Bryan is being referred to [place] for Abnormal LFTs. He was seen this June at [facility] by Dr. Alessandra Bevels. The records are in Epic for your review as you are the DOD. Please advise on scheduling. Thank you!

## 2018-12-25 NOTE — Telephone Encounter (Signed)
Went on [name] to submit PA for CPT [phone].  PA was approved. Valid 12/25/2018-06/23/2019. Approval number [id number].

## 2018-12-25 NOTE — Telephone Encounter (Signed)
I have sent a message asking them to send him a referral to follow-up with Dr. Luan Pulling and if they refuse care to him that he is welcomed to come here.

## 2018-12-25 NOTE — Telephone Encounter (Signed)
Appointment is scheduled at [facility] on 01/01/2019 at 3:15 PM.

## 2018-12-25 NOTE — Telephone Encounter (Signed)
He should follow-up with Dr. Luan Pulling at [facility] gastroenterology.  Please provide him with their office number.  If they refuse to care for him then certainly he is welcome here.

## 2018-12-26 NOTE — Progress Notes (Signed)
Patient notified of results & recommendations. Expressed understanding.

## 2018-12-26 NOTE — Telephone Encounter (Signed)
Patient notified of CT appointment details. He states that he has dialysis on that day. Gave him the number to scheduling so that he could call and reschedule to a day that works for him.

## 2018-12-31 ENCOUNTER — Encounter (HOSPITAL_COMMUNITY): Payer: Medicaid Other | Admitting: Cardiology

## 2019-01-01 ENCOUNTER — Ambulatory Visit (HOSPITAL_COMMUNITY): Payer: Medicare Other | Attending: Internal Medicine

## 2019-01-07 ENCOUNTER — Ambulatory Visit (HOSPITAL_COMMUNITY)
Admission: RE | Admit: 2019-01-07 | Discharge: 2019-01-07 | Disposition: A | Discharge status: Home / Self Care | Payer: Medicare Other | Source: Ambulatory Visit | Attending: Cardiology | Admitting: Cardiology

## 2019-01-07 ENCOUNTER — Ambulatory Visit (INDEPENDENT_AMBULATORY_CARE_PROVIDER_SITE_OTHER): Payer: Medicaid Other | Admitting: *Deleted

## 2019-01-07 ENCOUNTER — Encounter (HOSPITAL_COMMUNITY): Payer: Self-pay | Admitting: Cardiology

## 2019-01-07 ENCOUNTER — Other Ambulatory Visit: Payer: Self-pay

## 2019-01-07 VITALS — BP 120/88 | HR 61 | Wt 207.0 lb

## 2019-01-07 DIAGNOSIS — I480 Paroxysmal atrial fibrillation: Secondary | ICD-10-CM

## 2019-01-07 DIAGNOSIS — Z95810 Presence of automatic (implantable) cardiac defibrillator: Secondary | ICD-10-CM

## 2019-01-07 DIAGNOSIS — Z794 Long term (current) use of insulin: Secondary | ICD-10-CM | POA: Insufficient documentation

## 2019-01-07 DIAGNOSIS — I4819 Other persistent atrial fibrillation: Secondary | ICD-10-CM | POA: Diagnosis not present

## 2019-01-07 DIAGNOSIS — I2510 Atherosclerotic heart disease of native coronary artery without angina pectoris: Secondary | ICD-10-CM | POA: Insufficient documentation

## 2019-01-07 DIAGNOSIS — I428 Other cardiomyopathies: Secondary | ICD-10-CM | POA: Diagnosis not present

## 2019-01-07 DIAGNOSIS — Z5181 Encounter for therapeutic drug level monitoring: Secondary | ICD-10-CM

## 2019-01-07 DIAGNOSIS — E1122 Type 2 diabetes mellitus with diabetic chronic kidney disease: Secondary | ICD-10-CM | POA: Insufficient documentation

## 2019-01-07 DIAGNOSIS — I5022 Chronic systolic (congestive) heart failure: Secondary | ICD-10-CM | POA: Diagnosis not present

## 2019-01-07 DIAGNOSIS — N186 End stage renal disease: Secondary | ICD-10-CM | POA: Diagnosis not present

## 2019-01-07 DIAGNOSIS — I4892 Unspecified atrial flutter: Secondary | ICD-10-CM | POA: Insufficient documentation

## 2019-01-07 DIAGNOSIS — Z79899 Other long term (current) drug therapy: Secondary | ICD-10-CM | POA: Insufficient documentation

## 2019-01-07 DIAGNOSIS — I483 Typical atrial flutter: Secondary | ICD-10-CM

## 2019-01-07 DIAGNOSIS — Z7901 Long term (current) use of anticoagulants: Secondary | ICD-10-CM | POA: Insufficient documentation

## 2019-01-07 DIAGNOSIS — Z8249 Family history of ischemic heart disease and other diseases of the circulatory system: Secondary | ICD-10-CM | POA: Insufficient documentation

## 2019-01-07 LAB — COMPREHENSIVE METABOLIC PANEL
ALT: 21 U/L (ref 0–44)
AST: 20 U/L (ref 15–41)
Albumin: 3.1 g/dL — ABNORMAL LOW (ref 3.5–5.0)
Alkaline Phosphatase: 185 U/L — ABNORMAL HIGH (ref 38–126)
Anion gap: 15 (ref 5–15)
BUN: 42 mg/dL — ABNORMAL HIGH (ref 6–20)
CO2: 29 mmol/L (ref 22–32)
Calcium: 8.4 mg/dL — ABNORMAL LOW (ref 8.9–10.3)
Chloride: 95 mmol/L — ABNORMAL LOW (ref 98–111)
Creatinine, Ser: 7.95 mg/dL — ABNORMAL HIGH (ref 0.61–1.24)
GFR calc Af Amer: 8 mL/min — ABNORMAL LOW (ref >60)
GFR calc non Af Amer: 7 mL/min — ABNORMAL LOW (ref >60)
Glucose, Bld: 90 mg/dL (ref 70–99)
Potassium: 3.2 mmol/L — ABNORMAL LOW (ref 3.5–5.1)
Sodium: 139 mmol/L (ref 135–145)
Total Bilirubin: 2.1 mg/dL — ABNORMAL HIGH (ref 0.3–1.2)
Total Protein: 7.2 g/dL (ref 6.5–8.1)

## 2019-01-07 LAB — LIPID PANEL
Cholesterol: 177 mg/dL (ref 0–200)
HDL: 61 mg/dL (ref >40)
LDL Cholesterol: 86 mg/dL (ref 0–99)
Total CHOL/HDL Ratio: 2.9 RATIO
Triglycerides: 148 mg/dL (ref <150)
VLDL: 30 mg/dL (ref 0–40)

## 2019-01-07 LAB — T4, FREE: Free T4: 1.36 ng/dL — ABNORMAL HIGH (ref 0.61–1.12)

## 2019-01-07 LAB — POCT INR: INR: 2 (ref 2.0–3.0)

## 2019-01-07 LAB — TSH: TSH: 9.119 u[IU]/mL — ABNORMAL HIGH (ref 0.350–4.500)

## 2019-01-07 MED ORDER — METOPROLOL SUCCINATE ER 25 MG PO TB24: 25.0000 mg | ORAL_TABLET | Freq: Every day | ORAL | 6 refills | Status: DC

## 2019-01-07 MED FILL — METOPROLOL SUCCINATE ER 25: 25 | 30 days supply | Qty: 30 | Fill #0

## 2019-01-07 NOTE — Patient Instructions (Signed)
Description   Continue taking 1 tablet daily except 1/2 tablet Sundays and Thursdays. Recheck INR in 3 weeks. Please call coumadin clinic with any changes in medications. [phone]

## 2019-01-07 NOTE — Patient Instructions (Signed)
STOP Metoprolol tartate  START Toprol XL 25mg  (1 tab) daily  Labs today We will only contact you if something comes back abnormal or we need to make some changes. Otherwise no news is good news!  You will receive a call to schedule an appointment with Electrophysiology.  If you do not get this call, please call our office.  Your physician recommends that you schedule a follow-up appointment in: 4 months with Dr Aundra Dubin  At the [facility], you and your health needs are our priority. As part of our continuing mission to provide you with exceptional heart care, we have created designated Provider Care Teams. These Care Teams include your primary Cardiologist (physician) and Advanced Practice Providers (APPs- Physician Assistants and Nurse Practitioners) who all work together to provide you with the care you need, when you need it.   You may see any of the following providers on your designated Care Team at your next follow up: Marland Kitchen Dr Glori Bickers . Dr Loralie Champagne . Darrick Grinder, NP . Lyda Jester, PA   Please be sure to bring in all your medications bottles to every appointment.

## 2019-01-07 NOTE — Progress Notes (Signed)
PCP: [name], FNP HF Cardiology: Dr. Aundra Dubin EP: Dr. Lovena Le Nephrology: HD in [place], [place]  Travis Bryan is a 48 y.o. male with history of poorly controlled DM, atrial flutter and fibrillation, nonischemic cardiomyopathy, and ESRD presents for followup of CHF.  Cardiomyopathy has been known for a number of years. Cath in 6/06 showed no obstructive coronary disease.  He had ICD discharge for VT in 10/17 and again in 1/18, both times likely in the setting of CHF exacerbation.    He was admitted in 1/18 with exertional dyspnea/CHF exacerbation.  ICD had also discharged for VT.  He was noted to be in new atrial flutter.  He had a flutter ablation.  However, post-procedure, he developed AKI and cardiogenic shock.  Dobutamine and norepinephrine were required but eventually titrated off.  He developed atrial fibrillation subsequent to the atrial flutter ablation.  He had DCCV to NSR 04/11/16.  He is on amiodarone.   Echo 09/2016 EF 25-30%, moderate dilation, moderate LVH, mild MR, moderately decreased RV systolic function, PASP 51 mmHg.  Echo in 12/19 with EF 25-30%.    He progressed to ESRD in 2/20.   He went back into atrial fibrillation in 7/20 and failed DCCV, but converted to NSR on amiodarone.   He has been doing well recently.  Volume is controlled well by HD.  He is not short of breath with ADLs or walking on flat ground.  No chest pain.  No orthopnea/PND.  No lightheadedness, says he has not had problems with HD.  He is now living in [place], [place] but wants to continue cardiology care in [place].  He gets HD in [place].   ECG (personally reviewed): NSR, LBBB 166 msec   Labs (2/18): hgb 14.6, K 4.9, creatinine 3.43 Labs 04/26/2016: K 4.1 Creatinine 2.79, TSH normal, LFTs normal, hgb 14.4 Labs (4/18): K 3.7, creatinine 2.61 Labs (6/18): LDL 167, HDL 49, hgb A1c 6 Labs (8/20): Low TSH, normal T3, elevated free T4.   PMH: 1. Chronic systolic CHF: Nonischemic cardiomyopathy.   St Jude ICD.  - LHC (6/06): Nonobstructive CAD.  - Echo (1/18): EF 20%, moderate RV dilation with moderate-severely decreased RV systolic function, PASP 48 mmHg.  - Cardiolite (1/18): EF 7%, large fixed inferolateral defect no ischemia.  - Echo (12/19): EF 25-30%.  2. Atrial flutter: s/p ablation 1/18.  3. Atrial fibrillation: Paroxysmal.  S/p DCCV 1/18.  4. ESRD 48. Type II diabetes: Poorly controlled over time.  6. VT: 10/17 and 1/18 with ICD discharge.    Review of systems complete and found to be negative unless listed in HPI.    Social History   Socioeconomic History  . Marital status: Widowed    Spouse name: Not on file  . Number of children: Not on file  . Years of education: Not on file  . Highest education level: Not on file  Occupational History  . Not on file  Social Needs  . Financial resource strain: Not on file  . Food insecurity    Worry: Not on file    Inability: Not on file  . Transportation needs    Medical: Not on file    Non-medical: Not on file  Tobacco Use  . Smoking status: Never Smoker  . Smokeless tobacco: Never Used  . Tobacco comment: pt denies smoking cigars  Substance and Sexual Activity  . Alcohol use: No  . Drug use: No  . Sexual activity: Not Currently  Lifestyle  . Physical activity  Days per week: Not on file    Minutes per session: Not on file  . Stress: Not on file  Relationships  . Social Herbalist on phone: Not on file    Gets together: Not on file    Attends religious service: Not on file    Active member of club or organization: Not on file    Attends meetings of clubs or organizations: Not on file    Relationship status: Not on file  Other Topics Concern  . Not on file  Social History Narrative  . Not on file   Family History  Problem Relation Age of Onset  . Hypertension Mother   . Heart disease Mother   . Diabetes Mother    Review of systems complete and found to be negative unless listed in HPI.     Current Outpatient Medications  Medication Sig Dispense Refill  . amiodarone (PACERONE) 200 MG tablet Take 200 mg by mouth daily.    Marland Kitchen atorvastatin (LIPITOR) 40 MG tablet Take 1 tablet (40 mg total) by mouth daily at 6 PM. 90 tablet 2  . insulin aspart (NOVOLOG) 100 unit/mL injection Inject 8 Units into the skin as needed.    . insulin glargine (LANTUS) 100 UNIT/ML injection Inject 0.08 mLs (8 Units total) into the skin at bedtime as needed. 10 mL 5  . Insulin Syringe-Needle U-100 31G X 15/64" 0.3 ML MISC Use to inject insulin 4 times a day 200 each 12  . sevelamer carbonate (RENVELA) 800 MG tablet Take 1,600 mg by mouth 3 (three) times daily with meals.    . warfarin (COUMADIN) 5 MG tablet Take as directed by the coumadin clinic. 30 tablet 2  . metoprolol succinate (TOPROL-XL) 25 MG 24 hr tablet Take 1 tablet (25 mg total) by mouth daily. 30 tablet 6   No current facility-administered medications for this encounter.    Vitals:   01/07/19 0851  BP: 120/88  Pulse: 61  SpO2: 96%  Weight: 93.9 kg (207 lb)   Wt Readings from Last 3 Encounters:  01/07/19 93.9 kg (207 lb)  12/24/18 93.4 kg (206 lb)  11/07/18 93.6 kg (206 lb 6.4 oz)   Physical Exam General: Well appearing. No resp difficulty. HEENT: Normal x/for poor dentition.  Neck: Supple. JVP 6-7 cm. Carotids 2+ bilat; no bruits. No thyromegaly or nodule noted. Cor: PMI nondisplaced. RRR, No M/G/R noted Lungs: CTAB, normal effort. Abdomen: Soft, non-tender, non-distended, no HSM. No bruits or masses. +BS  Extremities: No cyanosis, clubbing, or rash. R and LLE no edema.  Neuro: Alert & orientedx3, cranial nerves grossly intact. moves all 4 extremities w/o difficulty. Affect pleasant   Assessment/Plan: 1. Chronic systolic CHF: Nonischemic cardiomyopathy.  St Jude ICD.  Echo 1/18 with EF 20% and moderate to severely decreased RV systolic function.  Echo 6/18 with EF 25-30%, moderate dilation, moderate LVH, mild MR, moderately  decreased RV systolic function, PASP 51 mmHg. Echo in 12/19 with EF 25-30%.  At this point, volume is well-controlled at HD.  NYHA class II symptoms. BP stable but will need to be careful with HF medication titration given need for HD.  - Stop metoprolol tartrate, start Toprol XL 25 mg daily.  - He has a wide LBBB, but given HD and infection risk (and St Jude device is on same side as AV fistula) as well as minimal symptoms, would not plan CRT upgrade at this time.  2. Atrial fibrillation: He is in NSR today  on amiodarone.  - Continue amiodarone 200 mg daily.  Check LFTs today.  He will need a regular eye exam.  Abnormal TFTs in 8/20, will check TSH, free T3, and free T4 today.  - Continue warfarin for anticoagulation.  3. Diabetes: Per PCP.  4. VT: Now on amiodarone.  5. ESRD: Per nephrology.   Followup in 4 months.   Loralie Champagne, MD  01/07/2019

## 2019-01-08 LAB — T3, FREE: T3, Free: 2.3 pg/mL (ref 2.0–4.4)

## 2019-02-03 MED FILL — METOPROLOL SUCCINATE ER 25: 25 | 30 days supply | Qty: 30 | Fill #1

## 2019-02-03 MED FILL — ATORVASTATIN CALCIUM 40 MG: 40 | 30 days supply | Qty: 30 | Fill #6

## 2019-02-03 MED FILL — WARFARIN SODIUM 5 MG TABLET: 5 | 30 days supply | Qty: 30 | Fill #1

## 2019-02-13 ENCOUNTER — Ambulatory Visit (INDEPENDENT_AMBULATORY_CARE_PROVIDER_SITE_OTHER): Payer: Medicare Other | Admitting: *Deleted

## 2019-02-13 ENCOUNTER — Ambulatory Visit (INDEPENDENT_AMBULATORY_CARE_PROVIDER_SITE_OTHER): Payer: Medicaid Other

## 2019-02-13 ENCOUNTER — Other Ambulatory Visit: Payer: Self-pay

## 2019-02-13 DIAGNOSIS — Z95810 Presence of automatic (implantable) cardiac defibrillator: Secondary | ICD-10-CM | POA: Diagnosis not present

## 2019-02-13 DIAGNOSIS — I428 Other cardiomyopathies: Secondary | ICD-10-CM

## 2019-02-13 DIAGNOSIS — I5022 Chronic systolic (congestive) heart failure: Secondary | ICD-10-CM | POA: Diagnosis not present

## 2019-02-13 DIAGNOSIS — Z5181 Encounter for therapeutic drug level monitoring: Secondary | ICD-10-CM | POA: Diagnosis not present

## 2019-02-13 DIAGNOSIS — I4819 Other persistent atrial fibrillation: Secondary | ICD-10-CM | POA: Diagnosis not present

## 2019-02-13 DIAGNOSIS — I483 Typical atrial flutter: Secondary | ICD-10-CM | POA: Diagnosis not present

## 2019-02-13 LAB — CUP PACEART INCLINIC DEVICE CHECK
Battery Remaining Longevity: 60 mo
Brady Statistic RV Percent Paced: 0.04 %
Date Time Interrogation Session: 20201203133149
HighPow Impedance: 54 Ohm
Implantable Lead Implant Date: 20090211
Implantable Lead Location: 753860
Implantable Lead Model: 7121
Implantable Pulse Generator Implant Date: 20150617
Lead Channel Impedance Value: 375 Ohm
Lead Channel Pacing Threshold Amplitude: 1 V
Lead Channel Pacing Threshold Pulse Width: 0.5 ms
Lead Channel Sensing Intrinsic Amplitude: 12 mV
Lead Channel Setting Pacing Amplitude: 2.5 V
Lead Channel Setting Pacing Pulse Width: 0.5 ms
Lead Channel Setting Sensing Sensitivity: 0.5 mV
Pulse Gen Serial Number: 7200640

## 2019-02-13 LAB — POCT INR: INR: 1.8 — AB (ref 2.0–3.0)

## 2019-02-13 MED FILL — AMIODARONE HCL 200 MG TAB: 200 | 60 days supply | Qty: 60 | Fill #2

## 2019-02-13 NOTE — Progress Notes (Signed)
ICD check in clinic. Normal device function. Thresholds and sensing consistent with previous device measurements. Impedance trends stable over time. No ventricular arrhythmias. Magnet response episodes occurred during surgical procedure on 04/25/18 and date of RV noise reversion (no EGM) correlates with procedure on 07/19/18. Histogram distribution appropriate for patient and level of activity. Device programmed at appropriate safety margins. Device programmed to optimize intrinsic conduction. Estimated longevity 4.9 years. Cybersecurity software update delivered today. Pt enrolled in remote follow-up, new Merlin monitor paired and given to patient today, compliance encouraged. Patient education completed including shock plan. Vibratory alert demonstrated. Merlin on 05/15/19 and ROV with GT in 07/2019.

## 2019-02-13 NOTE — Patient Instructions (Signed)
Description   Take 1 tablet today, then resume same dosage 1 tablet daily except 1/2 tablet Sundays and Thursdays. Recheck INR in 3 weeks. Please call coumadin clinic with any changes in medications. [phone]

## 2019-03-04 MED FILL — WARFARIN SODIUM 5 MG TABLET: 5 | 30 days supply | Qty: 30 | Fill #2

## 2019-03-04 MED FILL — METOPROLOL SUCCINATE ER 25: 25 | 30 days supply | Qty: 30 | Fill #2

## 2019-03-04 MED FILL — ATORVASTATIN CALCIUM 40 MG: 40 | 30 days supply | Qty: 30 | Fill #7

## 2019-03-06 ENCOUNTER — Ambulatory Visit (INDEPENDENT_AMBULATORY_CARE_PROVIDER_SITE_OTHER): Payer: Medicaid Other | Admitting: Pharmacist

## 2019-03-06 ENCOUNTER — Other Ambulatory Visit: Payer: Self-pay

## 2019-03-06 DIAGNOSIS — I4819 Other persistent atrial fibrillation: Secondary | ICD-10-CM | POA: Diagnosis not present

## 2019-03-06 DIAGNOSIS — Z5181 Encounter for therapeutic drug level monitoring: Secondary | ICD-10-CM

## 2019-03-06 DIAGNOSIS — Z95810 Presence of automatic (implantable) cardiac defibrillator: Secondary | ICD-10-CM | POA: Diagnosis not present

## 2019-03-06 DIAGNOSIS — I483 Typical atrial flutter: Secondary | ICD-10-CM

## 2019-03-06 LAB — POCT INR: INR: 1.7 — AB (ref 2.0–3.0)

## 2019-03-06 NOTE — Patient Instructions (Signed)
Description   Start taking 1 tablet daily except 1/2 tablet on Sundays. Recheck INR in 3 weeks. Please call coumadin clinic with any changes in medications. [phone]

## 2019-03-27 ENCOUNTER — Ambulatory Visit (INDEPENDENT_AMBULATORY_CARE_PROVIDER_SITE_OTHER): Payer: Medicare Other | Admitting: Pharmacist

## 2019-03-27 ENCOUNTER — Other Ambulatory Visit: Payer: Self-pay

## 2019-03-27 DIAGNOSIS — Z95810 Presence of automatic (implantable) cardiac defibrillator: Secondary | ICD-10-CM | POA: Diagnosis not present

## 2019-03-27 DIAGNOSIS — I483 Typical atrial flutter: Secondary | ICD-10-CM

## 2019-03-27 DIAGNOSIS — I4819 Other persistent atrial fibrillation: Secondary | ICD-10-CM

## 2019-03-27 DIAGNOSIS — Z5181 Encounter for therapeutic drug level monitoring: Secondary | ICD-10-CM | POA: Diagnosis not present

## 2019-03-27 LAB — POCT INR: INR: 1.8 — AB (ref 2.0–3.0)

## 2019-03-27 NOTE — Patient Instructions (Signed)
Description   Take 1.5 tablets today, then start taking 1 tablet daily. Recheck INR in 3 weeks. Please call coumadin clinic with any changes in medications. [phone]

## 2019-04-01 ENCOUNTER — Other Ambulatory Visit (HOSPITAL_COMMUNITY): Payer: Self-pay | Admitting: Physician Assistant

## 2019-04-01 ENCOUNTER — Ambulatory Visit: Payer: Medicaid Other

## 2019-04-01 ENCOUNTER — Other Ambulatory Visit: Payer: Self-pay | Admitting: Internal Medicine

## 2019-04-01 MED FILL — WARFARIN SODIUM 5 MG TABLET: 5 | 30 days supply | Qty: 35 | Fill #0

## 2019-04-01 MED FILL — METOPROLOL SUCCINATE ER 25: 25 | 30 days supply | Qty: 30 | Fill #3

## 2019-04-01 MED FILL — AMIODARONE HCL 200 MG TAB: 200 | 30 days supply | Qty: 30 | Fill #0

## 2019-04-01 MED FILL — ATORVASTATIN CALCIUM 40 MG: 40 | 30 days supply | Qty: 30 | Fill #8

## 2019-04-08 ENCOUNTER — Ambulatory Visit (INDEPENDENT_AMBULATORY_CARE_PROVIDER_SITE_OTHER): Payer: Medicare Other | Admitting: Internal Medicine

## 2019-04-08 ENCOUNTER — Other Ambulatory Visit: Payer: Self-pay

## 2019-04-08 DIAGNOSIS — E1159 Type 2 diabetes mellitus with other circulatory complications: Secondary | ICD-10-CM | POA: Diagnosis not present

## 2019-04-08 DIAGNOSIS — R945 Abnormal results of liver function studies: Secondary | ICD-10-CM

## 2019-04-08 DIAGNOSIS — I10 Essential (primary) hypertension: Secondary | ICD-10-CM

## 2019-04-08 DIAGNOSIS — Z992 Dependence on renal dialysis: Secondary | ICD-10-CM

## 2019-04-08 DIAGNOSIS — Z794 Long term (current) use of insulin: Secondary | ICD-10-CM

## 2019-04-08 DIAGNOSIS — I5022 Chronic systolic (congestive) heart failure: Secondary | ICD-10-CM | POA: Diagnosis not present

## 2019-04-08 DIAGNOSIS — R590 Localized enlarged lymph nodes: Secondary | ICD-10-CM

## 2019-04-08 DIAGNOSIS — R7989 Other specified abnormal findings of blood chemistry: Secondary | ICD-10-CM

## 2019-04-08 DIAGNOSIS — N186 End stage renal disease: Secondary | ICD-10-CM

## 2019-04-08 DIAGNOSIS — R932 Abnormal findings on diagnostic imaging of liver and biliary tract: Secondary | ICD-10-CM

## 2019-04-08 DIAGNOSIS — I480 Paroxysmal atrial fibrillation: Secondary | ICD-10-CM

## 2019-04-08 NOTE — Progress Notes (Signed)
Virtual Visit via Telephone Note Due to current restrictions/limitations of in-office visits due to the COVID-19 pandemic, this scheduled clinical appointment was converted to a telehealth visit  I connected with Travis Bryan on 04/08/19 at 1:44 p.m by telephone and verified that I am speaking with the correct person using two identifiers. I am in my office.  The patient is at home.  Only the patient and myself participated in this encounter.  I discussed the limitations, risks, security and privacy concerns of performing an evaluation and management service by telephone and the availability of in person appointments. I also discussed with the patient that there may be a patient responsible charge related to this service. The patient expressed understanding and agreed to proceed.   History of Present Illness: Patient with history of HTN, HL, CAD with AICD, sys CHF, obesity, A. fib, DM, CVA, ESRD on HD   Abnormal LFTs:  Patient hospitalized back in July with sepsis.  CT of the abdomen revealed findings in the liver suggestive of cirrhosis.  He also had present large bowel mesenteric and retroperitoneal lymph nodes.  Radiologist recommended repeat CAT scan in 3 months to ensure resolution.  Patient denies any fever or night sweats.  He denies any major weight changes.  In regards to the liver he does not drink alcohol. -Hep C neg.  - Referred to GI.  Sent to [facility] but they wanted it sent to [facility] Dr. Bill Salinas who saw him while in hosp 09/2018.  Patient states he never received a call for the appointment. -I had ordered the CAT scan of his abdomen.  We got it approved through his insurance.  My CMA had called him to give appointment date but he declined it stating that it was on the day when he has hemodialysis.  He was given the phone number to call to schedule it at his convenience but he did not do so.  States he did not have a pen that day to write down the number.  DM: not checking BS but does  have a device. Still not taking Lantus or Novolog.  Last A1C was 6.1. Doing well with eating habits.   States he does a lot of walking  HYPERTENSION/CAD/sys CHF/A.fib Currently taking: see medication list Med Adherence: [x]  Yes    []  No Medication side effects: []  Yes    [x]  No Adherence with salt restriction: [x]  Yes    []  No Home Monitoring?: []  Yes    [x]  No.  He tells me his blood pressure is checked during hemodialysis and blood pressure has been okay. Monitoring Frequency: []  Yes    []  No Home BP results range: []  Yes    []  No SOB? []  Yes    [x]  No Chest Pain?: []  Yes    [x]  No Leg swelling?: []  Yes    [x]  No Headaches?: []  Yes    [x]  No Dizziness? []  Yes    [x]  No Comments:  No palpitations.  No bruising bleeding on Coumadin  ESRD: Goes to dialysis on Monday Wednesdays and Fridays.  Outpatient Encounter Medications as of 04/08/2019  Medication Sig  . amiodarone (PACERONE) 200 MG tablet TAKE 1 TABLET BY MOUTH DAILY.  Marland Kitchen atorvastatin (LIPITOR) 40 MG tablet Take 1 tablet (40 mg total) by mouth daily at 6 PM.  . insulin aspart (NOVOLOG) 100 unit/mL injection Inject 8 Units into the skin as needed.  . insulin glargine (LANTUS) 100 UNIT/ML injection Inject 0.08 mLs (8 Units total) into the skin  at bedtime as needed.  . Insulin Syringe-Needle U-100 31G X 15/64" 0.3 ML MISC Use to inject insulin 4 times a day  . metoprolol succinate (TOPROL-XL) 25 MG 24 hr tablet Take 1 tablet (25 mg total) by mouth daily.  . sevelamer carbonate (RENVELA) 800 MG tablet Take 1,600 mg by mouth 3 (three) times daily with meals.  . warfarin (COUMADIN) 5 MG tablet TAKE AS DIRECTED BY THE COUMADIN CLINIC.   No facility-administered encounter medications on file as of 04/08/2019.    Observations/Objective:   Assessment and Plan: 1. Type 2 diabetes mellitus with other circulatory complication, with long-term current use of insulin ([place]) Encourage patient to check blood sugars if only once a day  especially since he is not taking insulin. Encouraged him to continue healthy eating habits. - Hemoglobin A1c; Future  2. Essential hypertension Also his blood pressure readings in the system are at goal.  Continue metoprolol and low-salt diet  3. ESRD on dialysis [facility]) Reports compliance and attending his dialysis sessions  4. Chronic systolic heart failure ([place]) History suggest that he is compensated  5. PAF (paroxysmal atrial fibrillation) (HCC) On metoprolol plan Coumadin.  Coumadin followed by cardiology.  6. Retroperitoneal lymphadenopathy We will have CMA try to reschedule the CAT scan of his abdomen on a Tuesday or Thursday which are the days that he is not in dialysis  7. Abnormal finding on imaging of liver 8. Abnormal LFTs We will resubmit referral to gastroenterology at [facility] - Hepatic Function Panel; Future    Follow Up Instructions: 4 mths with Dr. Juleen China   I discussed the assessment and treatment plan with the patient. The patient was provided an opportunity to ask questions and all were answered. The patient agreed with the plan and demonstrated an understanding of the instructions.   The patient was advised to call back or seek an in-person evaluation if the symptoms worsen or if the condition fails to improve as anticipated.  I provided 12 minutes of non-face-to-face time during this encounter.   Karle Plumber, MD

## 2019-04-17 ENCOUNTER — Ambulatory Visit (INDEPENDENT_AMBULATORY_CARE_PROVIDER_SITE_OTHER): Payer: Medicare Other | Admitting: *Deleted

## 2019-04-17 ENCOUNTER — Other Ambulatory Visit: Payer: Self-pay

## 2019-04-17 DIAGNOSIS — Z95810 Presence of automatic (implantable) cardiac defibrillator: Secondary | ICD-10-CM

## 2019-04-17 DIAGNOSIS — I4819 Other persistent atrial fibrillation: Secondary | ICD-10-CM | POA: Diagnosis not present

## 2019-04-17 DIAGNOSIS — I483 Typical atrial flutter: Secondary | ICD-10-CM | POA: Diagnosis not present

## 2019-04-17 DIAGNOSIS — Z5181 Encounter for therapeutic drug level monitoring: Secondary | ICD-10-CM | POA: Diagnosis not present

## 2019-04-17 LAB — POCT INR: INR: 2.4 (ref 2.0–3.0)

## 2019-04-17 NOTE — Patient Instructions (Signed)
Description   Continue taking 1 tablet daily. Recheck INR in 4 weeks. Please call coumadin clinic with any changes in medications. [phone]

## 2019-05-05 ENCOUNTER — Other Ambulatory Visit: Payer: Self-pay | Admitting: Family Medicine

## 2019-05-05 DIAGNOSIS — E1169 Type 2 diabetes mellitus with other specified complication: Secondary | ICD-10-CM

## 2019-05-05 DIAGNOSIS — E785 Hyperlipidemia, unspecified: Secondary | ICD-10-CM

## 2019-05-05 MED FILL — METOPROLOL SUCCINATE ER 25: 25 | 30 days supply | Qty: 30 | Fill #4

## 2019-05-05 NOTE — Telephone Encounter (Signed)
Patient at [facility]- request for RF of medication prescribed.

## 2019-05-06 ENCOUNTER — Encounter (HOSPITAL_COMMUNITY): Payer: Medicaid Other | Admitting: Cardiology

## 2019-05-08 ENCOUNTER — Other Ambulatory Visit: Payer: Self-pay | Admitting: Family Medicine

## 2019-05-08 DIAGNOSIS — E1169 Type 2 diabetes mellitus with other specified complication: Secondary | ICD-10-CM

## 2019-05-15 ENCOUNTER — Other Ambulatory Visit: Payer: Self-pay

## 2019-05-15 ENCOUNTER — Telehealth: Payer: Self-pay | Admitting: Family Medicine

## 2019-05-15 ENCOUNTER — Ambulatory Visit (INDEPENDENT_AMBULATORY_CARE_PROVIDER_SITE_OTHER): Payer: Medicare Other | Admitting: *Deleted

## 2019-05-15 DIAGNOSIS — I483 Typical atrial flutter: Secondary | ICD-10-CM | POA: Diagnosis not present

## 2019-05-15 DIAGNOSIS — I4819 Other persistent atrial fibrillation: Secondary | ICD-10-CM

## 2019-05-15 DIAGNOSIS — Z95810 Presence of automatic (implantable) cardiac defibrillator: Secondary | ICD-10-CM | POA: Diagnosis not present

## 2019-05-15 DIAGNOSIS — Z5181 Encounter for therapeutic drug level monitoring: Secondary | ICD-10-CM | POA: Diagnosis not present

## 2019-05-15 DIAGNOSIS — E785 Hyperlipidemia, unspecified: Secondary | ICD-10-CM

## 2019-05-15 DIAGNOSIS — E1169 Type 2 diabetes mellitus with other specified complication: Secondary | ICD-10-CM

## 2019-05-15 LAB — CUP PACEART REMOTE DEVICE CHECK
Battery Remaining Longevity: 56 mo
Battery Remaining Percentage: 54 %
Battery Voltage: 2.92 V
Brady Statistic RV Percent Paced: 1 %
Date Time Interrogation Session: 20210304020015
HighPow Impedance: 65 Ohm
HighPow Impedance: 65 Ohm
Implantable Lead Implant Date: 20090211
Implantable Lead Location: 753860
Implantable Lead Model: 7121
Implantable Pulse Generator Implant Date: 20150617
Lead Channel Impedance Value: 450 Ohm
Lead Channel Pacing Threshold Amplitude: 1 V
Lead Channel Pacing Threshold Pulse Width: 0.5 ms
Lead Channel Sensing Intrinsic Amplitude: 12 mV
Lead Channel Setting Pacing Amplitude: 2.5 V
Lead Channel Setting Pacing Pulse Width: 0.5 ms
Lead Channel Setting Sensing Sensitivity: 0.5 mV
Pulse Gen Serial Number: 7200640

## 2019-05-15 LAB — POCT INR: INR: 1.7 — AB (ref 2.0–3.0)

## 2019-05-15 MED ORDER — ATORVASTATIN CALCIUM 40 MG PO TABS: 40.0000 mg | ORAL_TABLET | Freq: Every day | ORAL | 1 refills | Status: DC

## 2019-05-15 MED FILL — AMIODARONE HCL 200 MG TAB: 200 | 30 days supply | Qty: 30 | Fill #1

## 2019-05-15 MED FILL — ATORVASTATIN CALCIUM 40 MG: 40 | 90 days supply | Qty: 90 | Fill #0

## 2019-05-15 MED FILL — WARFARIN SODIUM 5 MG TABLET: 5 | 30 days supply | Qty: 35 | Fill #1

## 2019-05-15 NOTE — Progress Notes (Signed)
ICD Remote  

## 2019-05-15 NOTE — Patient Instructions (Signed)
Description   Today take 1.5 tablets then continue taking 1 tablet daily. Recheck INR in 3 weeks. Please call coumadin clinic with any changes in medications. [phone]

## 2019-05-15 NOTE — Telephone Encounter (Signed)
Refill sent.

## 2019-05-15 NOTE — Telephone Encounter (Signed)
1) Medication(s) Requested (by name): atorvastatin (LIPITOR) 40 MG tablet [id number]   2) Pharmacy of Choice: [facility], [address]  [place], [place] [address]     Approved medications will be sent to pharmacy, we will reach out to you if there is an issue.  Requests made after 3pm may not be addressed until following business day!

## 2019-06-05 ENCOUNTER — Ambulatory Visit (INDEPENDENT_AMBULATORY_CARE_PROVIDER_SITE_OTHER): Payer: Medicare Other | Admitting: *Deleted

## 2019-06-05 ENCOUNTER — Other Ambulatory Visit: Payer: Self-pay

## 2019-06-05 DIAGNOSIS — I4819 Other persistent atrial fibrillation: Secondary | ICD-10-CM

## 2019-06-05 DIAGNOSIS — I483 Typical atrial flutter: Secondary | ICD-10-CM | POA: Diagnosis not present

## 2019-06-05 DIAGNOSIS — Z95810 Presence of automatic (implantable) cardiac defibrillator: Secondary | ICD-10-CM

## 2019-06-05 DIAGNOSIS — Z5181 Encounter for therapeutic drug level monitoring: Secondary | ICD-10-CM | POA: Diagnosis not present

## 2019-06-05 LAB — POCT INR: INR: 1.9 — AB (ref 2.0–3.0)

## 2019-06-05 MED FILL — METOPROLOL SUCCINATE ER 25: 25 | 30 days supply | Qty: 30 | Fill #5

## 2019-06-05 NOTE — Patient Instructions (Signed)
Description   Start taking 1 tablet daily except for 1.5 tablets on Thursdays.  Recheck INR in 3 weeks. Please call coumadin clinic with any changes in medications. [phone]

## 2019-06-17 MED FILL — AMIODARONE HCL 200 MG TAB: 200 | 30 days supply | Qty: 30 | Fill #2

## 2019-06-24 MED FILL — WARFARIN SODIUM 5 MG TABLET: 5 | 30 days supply | Qty: 35 | Fill #2

## 2019-06-26 ENCOUNTER — Other Ambulatory Visit: Payer: Self-pay

## 2019-06-26 ENCOUNTER — Ambulatory Visit (INDEPENDENT_AMBULATORY_CARE_PROVIDER_SITE_OTHER): Payer: Medicare Other | Admitting: *Deleted

## 2019-06-26 DIAGNOSIS — I483 Typical atrial flutter: Secondary | ICD-10-CM

## 2019-06-26 DIAGNOSIS — Z95810 Presence of automatic (implantable) cardiac defibrillator: Secondary | ICD-10-CM

## 2019-06-26 DIAGNOSIS — I4819 Other persistent atrial fibrillation: Secondary | ICD-10-CM

## 2019-06-26 DIAGNOSIS — Z5181 Encounter for therapeutic drug level monitoring: Secondary | ICD-10-CM | POA: Diagnosis not present

## 2019-06-26 LAB — POCT INR: INR: 1.9 — AB (ref 2.0–3.0)

## 2019-06-26 MED ORDER — WARFARIN SODIUM 5 MG PO TABS: ORAL_TABLET | ORAL | 2 refills | Status: DC

## 2019-06-26 NOTE — Patient Instructions (Addendum)
Description   Today take 2 tablets then start taking 1 tablet daily except for 1.5 tablets on Sundays and Thursdays.  Recheck INR in 2 weeks with MD appt as pt pt lives 2 hours away. Please call coumadin clinic with any changes in medications. [phone]

## 2019-07-08 MED FILL — METOPROLOL SUCCINATE ER 25: 25 | 30 days supply | Qty: 30 | Fill #6

## 2019-07-10 ENCOUNTER — Encounter (HOSPITAL_COMMUNITY): Payer: Self-pay | Admitting: Cardiology

## 2019-07-10 ENCOUNTER — Ambulatory Visit (INDEPENDENT_AMBULATORY_CARE_PROVIDER_SITE_OTHER): Payer: Medicare Other | Admitting: *Deleted

## 2019-07-10 ENCOUNTER — Ambulatory Visit (HOSPITAL_COMMUNITY)
Admission: RE | Admit: 2019-07-10 | Discharge: 2019-07-10 | Disposition: A | Discharge status: Home / Self Care | Payer: Medicare Other | Source: Ambulatory Visit | Attending: Cardiology | Admitting: Cardiology

## 2019-07-10 ENCOUNTER — Other Ambulatory Visit: Payer: Self-pay

## 2019-07-10 VITALS — BP 106/82 | HR 77 | Wt 229.8 lb

## 2019-07-10 DIAGNOSIS — Z95810 Presence of automatic (implantable) cardiac defibrillator: Secondary | ICD-10-CM

## 2019-07-10 DIAGNOSIS — Z7901 Long term (current) use of anticoagulants: Secondary | ICD-10-CM | POA: Insufficient documentation

## 2019-07-10 DIAGNOSIS — Z992 Dependence on renal dialysis: Secondary | ICD-10-CM | POA: Insufficient documentation

## 2019-07-10 DIAGNOSIS — Z794 Long term (current) use of insulin: Secondary | ICD-10-CM | POA: Insufficient documentation

## 2019-07-10 DIAGNOSIS — I5022 Chronic systolic (congestive) heart failure: Secondary | ICD-10-CM | POA: Insufficient documentation

## 2019-07-10 DIAGNOSIS — I480 Paroxysmal atrial fibrillation: Secondary | ICD-10-CM | POA: Insufficient documentation

## 2019-07-10 DIAGNOSIS — N186 End stage renal disease: Secondary | ICD-10-CM | POA: Insufficient documentation

## 2019-07-10 DIAGNOSIS — I472 Ventricular tachycardia: Secondary | ICD-10-CM | POA: Insufficient documentation

## 2019-07-10 DIAGNOSIS — I4819 Other persistent atrial fibrillation: Secondary | ICD-10-CM

## 2019-07-10 DIAGNOSIS — I483 Typical atrial flutter: Secondary | ICD-10-CM

## 2019-07-10 DIAGNOSIS — Z79899 Other long term (current) drug therapy: Secondary | ICD-10-CM | POA: Insufficient documentation

## 2019-07-10 DIAGNOSIS — E1122 Type 2 diabetes mellitus with diabetic chronic kidney disease: Secondary | ICD-10-CM | POA: Diagnosis not present

## 2019-07-10 DIAGNOSIS — I5020 Unspecified systolic (congestive) heart failure: Secondary | ICD-10-CM | POA: Diagnosis not present

## 2019-07-10 DIAGNOSIS — Z8249 Family history of ischemic heart disease and other diseases of the circulatory system: Secondary | ICD-10-CM | POA: Insufficient documentation

## 2019-07-10 DIAGNOSIS — I428 Other cardiomyopathies: Secondary | ICD-10-CM | POA: Diagnosis not present

## 2019-07-10 DIAGNOSIS — Z5181 Encounter for therapeutic drug level monitoring: Secondary | ICD-10-CM | POA: Diagnosis not present

## 2019-07-10 DIAGNOSIS — I2510 Atherosclerotic heart disease of native coronary artery without angina pectoris: Secondary | ICD-10-CM | POA: Insufficient documentation

## 2019-07-10 DIAGNOSIS — Z833 Family history of diabetes mellitus: Secondary | ICD-10-CM | POA: Insufficient documentation

## 2019-07-10 LAB — COMPREHENSIVE METABOLIC PANEL
ALT: 28 U/L (ref 0–44)
AST: 23 U/L (ref 15–41)
Albumin: 3.9 g/dL (ref 3.5–5.0)
Alkaline Phosphatase: 167 U/L — ABNORMAL HIGH (ref 38–126)
Anion gap: 14 (ref 5–15)
BUN: 58 mg/dL — ABNORMAL HIGH (ref 6–20)
CO2: 27 mmol/L (ref 22–32)
Calcium: 9.4 mg/dL (ref 8.9–10.3)
Chloride: 94 mmol/L — ABNORMAL LOW (ref 98–111)
Creatinine, Ser: 14.5 mg/dL — ABNORMAL HIGH (ref 0.61–1.24)
GFR calc Af Amer: 4 mL/min — ABNORMAL LOW (ref >60)
GFR calc non Af Amer: 3 mL/min — ABNORMAL LOW (ref >60)
Glucose, Bld: 107 mg/dL — ABNORMAL HIGH (ref 70–99)
Potassium: 5.4 mmol/L — ABNORMAL HIGH (ref 3.5–5.1)
Sodium: 135 mmol/L (ref 135–145)
Total Bilirubin: 1.2 mg/dL (ref 0.3–1.2)
Total Protein: 8.3 g/dL — ABNORMAL HIGH (ref 6.5–8.1)

## 2019-07-10 LAB — CBC
HCT: 36.7 % — ABNORMAL LOW (ref 39.0–52.0)
Hemoglobin: 11.9 g/dL — ABNORMAL LOW (ref 13.0–17.0)
MCH: 31.6 pg (ref 26.0–34.0)
MCHC: 32.4 g/dL (ref 30.0–36.0)
MCV: 97.6 fL (ref 80.0–100.0)
Platelets: 190 10*3/uL (ref 150–400)
RBC: 3.76 MIL/uL — ABNORMAL LOW (ref 4.22–5.81)
RDW: 13.2 % (ref 11.5–15.5)
WBC: 6.3 10*3/uL (ref 4.0–10.5)
nRBC: 0 % (ref 0.0–0.2)

## 2019-07-10 LAB — T4, FREE: Free T4: 1.06 ng/dL (ref 0.61–1.12)

## 2019-07-10 LAB — POCT INR: INR: 2.5 (ref 2.0–3.0)

## 2019-07-10 LAB — TSH: TSH: 5.875 u[IU]/mL — ABNORMAL HIGH (ref 0.350–4.500)

## 2019-07-10 NOTE — Patient Instructions (Signed)
Description   Continue taking the dose you have been taking which is 1 tablet daily except for 1.5 tablets on  Thursdays.  Recheck INR in 3 weeks. Please call Coumadin Clinic with any changes in medications. [phone]

## 2019-07-10 NOTE — Patient Instructions (Signed)
Labs done today, we will call you for abnormal results  Please call our office October to schedule your 6 month follow up and echocardiogram  If you have any questions or concerns before your next appointment please send [place] a message through [place] or call our office at [phone].  At the [facility], you and your health needs are our priority. As part of our continuing mission to provide you with exceptional heart care, we have created designated Provider Care Teams. These Care Teams include your primary Cardiologist (physician) and Advanced Practice Providers (APPs- Physician Assistants and Nurse Practitioners) who all work together to provide you with the care you need, when you need it.   You may see any of the following providers on your designated Care Team at your next follow up: Marland Kitchen Dr Glori Bickers . Dr Loralie Champagne . Darrick Grinder, NP . Lyda Jester, PA . Audry Riles, PharmD   Please be sure to bring in all your medications bottles to every appointment.

## 2019-07-11 LAB — T3, FREE: T3, Free: 2.3 pg/mL (ref 2.0–4.4)

## 2019-07-11 NOTE — Progress Notes (Signed)
PCP: [name], FNP HF Cardiology: Dr. Aundra Dubin EP: Dr. Lovena Le Nephrology: HD in [place], [place]  Travis Bryan is a 49 y.o. male with history of poorly controlled DM, atrial flutter and fibrillation, nonischemic cardiomyopathy, and ESRD presents for followup of CHF.  Cardiomyopathy has been known for a number of years. Cath in 6/06 showed no obstructive coronary disease.  He had ICD discharge for VT in 10/17 and again in 1/18, both times likely in the setting of CHF exacerbation.    He was admitted in 1/18 with exertional dyspnea/CHF exacerbation.  ICD had also discharged for VT.  He was noted to be in new atrial flutter.  He had a flutter ablation.  However, post-procedure, he developed AKI and cardiogenic shock.  Dobutamine and norepinephrine were required but eventually titrated off.  He developed atrial fibrillation subsequent to the atrial flutter ablation.  He had DCCV to NSR 04/11/16.  He is on amiodarone.   Echo 09/2016 EF 25-30%, moderate dilation, moderate LVH, mild MR, moderately decreased RV systolic function, PASP 51 mmHg.  Echo in 12/19 with EF 25-30%.    He progressed to ESRD in 2/20.   He went back into atrial fibrillation in 7/20 and failed DCCV, but converted to NSR on amiodarone.   He has been doing well recently.  Volume is controlled well by HD.   He is now living in [place], [place] but wants to continue cardiology care in [place].  He gets HD in [place]. No chest pain.  Occasional lightheadedness after HD.  No significant exertional dyspnea.  No orthopnea/PND.  He can walk up a flight of stairs without problems.   ECG (personally reviewed): NSR, LBBB 186 msec  Labs (2/18): hgb 14.6, K 4.9, creatinine 3.43 Labs 04/26/2016: K 4.1 Creatinine 2.79, TSH normal, LFTs normal, hgb 14.4 Labs (4/18): K 3.7, creatinine 2.61 Labs (6/18): LDL 167, HDL 49, hgb A1c 6 Labs (8/20): Low TSH, normal T3, elevated free T4.  Labs (10/20): LDL 86, HDL 61, elevated TSH, elevated free  T4, normal free T3  PMH: 1. Chronic systolic CHF: Nonischemic cardiomyopathy.  St Jude ICD.  - LHC (6/06): Nonobstructive CAD.  - Echo (1/18): EF 20%, moderate RV dilation with moderate-severely decreased RV systolic function, PASP 48 mmHg.  - Cardiolite (1/18): EF 7%, large fixed inferolateral defect no ischemia.  - Echo (12/19): EF 25-30%.  2. Atrial flutter: s/p ablation 1/18.  3. Atrial fibrillation: Paroxysmal.  S/p DCCV 1/18.  4. ESRD 5. Type II diabetes: Poorly controlled over time.  6. VT: 10/17 and 1/18 with ICD discharge.    Review of systems complete and found to be negative unless listed in HPI.    Social History   Socioeconomic History  . Marital status: Widowed    Spouse name: Not on file  . Number of children: Not on file  . Years of education: Not on file  . Highest education level: Not on file  Occupational History  . Not on file  Tobacco Use  . Smoking status: Never Smoker  . Smokeless tobacco: Never Used  . Tobacco comment: pt denies smoking cigars  Substance and Sexual Activity  . Alcohol use: No  . Drug use: No  . Sexual activity: Not Currently  Other Topics Concern  . Not on file  Social History Narrative  . Not on file   Social Determinants of Health   Financial Resource Strain:   . Difficulty of Paying Living Expenses:   Food Insecurity:   . Worried  About Running Out of Food in the Last Year:   . [place] in the Last Year:   Transportation Needs:   . Lack of Transportation (Medical):   Marland Kitchen Lack of Transportation (Non-Medical):   Physical Activity:   . Days of Exercise per Week:   . Minutes of Exercise per Session:   Stress:   . Feeling of Stress :   Social Connections:   . Frequency of Communication with Friends and Family:   . Frequency of Social Gatherings with Friends and Family:   . Attends Religious Services:   . Active Member of Clubs or Organizations:   . Attends Archivist Meetings:   Marland Kitchen Marital Status:     Family History  Problem Relation Age of Onset  . Hypertension Mother   . Heart disease Mother   . Diabetes Mother    Review of systems complete and found to be negative unless listed in HPI.    Current Outpatient Medications  Medication Sig Dispense Refill  . amiodarone (PACERONE) 200 MG tablet TAKE 1 TABLET BY MOUTH DAILY. 30 tablet 2  . atorvastatin (LIPITOR) 40 MG tablet Take 1 tablet (40 mg total) by mouth daily at 6 PM. 90 tablet 1  . insulin aspart (NOVOLOG) 100 unit/mL injection Inject 8 Units into the skin as needed.    . insulin glargine (LANTUS) 100 UNIT/ML injection Inject 0.08 mLs (8 Units total) into the skin at bedtime as needed. 10 mL 5  . Insulin Syringe-Needle U-100 31G X 15/64" 0.3 ML MISC Use to inject insulin 4 times a day 200 each 12  . metoprolol succinate (TOPROL-XL) 25 MG 24 hr tablet Take 1 tablet (25 mg total) by mouth daily. 30 tablet 6  . sevelamer carbonate (RENVELA) 800 MG tablet Take 1,600 mg by mouth 3 (three) times daily with meals.    . warfarin (COUMADIN) 5 MG tablet Take 1 tablet daily except 1.5 tablets on Sundays and Thursday or as directed by Anticoagulation Clinic. 35 tablet 2   No current facility-administered medications for this encounter.   Vitals:   07/10/19 1153  BP: 106/82  Pulse: 77  SpO2: 98%  Weight: 104.2 kg (229 lb 12.8 oz)   Wt Readings from Last 3 Encounters:  07/10/19 104.2 kg (229 lb 12.8 oz)  01/07/19 93.9 kg (207 lb)  12/24/18 93.4 kg (206 lb)   Physical Exam General: NAD Neck: No JVD, no thyromegaly or thyroid nodule.  Lungs: Clear to auscultation bilaterally with normal respiratory effort. CV: Nondisplaced PMI.  Heart regular S1/S2, no S3/S4, no murmur.  No peripheral edema.  No carotid bruit.  Normal pedal pulses.  Abdomen: Soft, nontender, no hepatosplenomegaly, no distention.  Skin: Intact without lesions or rashes.  Neurologic: Alert and oriented x 3.  Psych: Normal affect. Extremities: No clubbing or  cyanosis.  HEENT: Normal.   Assessment/Plan: 1. Chronic systolic CHF: Nonischemic cardiomyopathy.  St Jude ICD.  Echo 1/18 with EF 20% and moderate to severely decreased RV systolic function.  Echo 6/18 with EF 25-30%, moderate dilation, moderate LVH, mild MR, moderately decreased RV systolic function, PASP 51 mmHg. Echo in 12/19 with EF 25-30%.  At this point, volume is well-controlled at HD.  NYHA class II symptoms. BP stable but will need to be careful with HF medication titration given need for HD.  - Continue Toprol XL 25 mg daily, will not increase with soft BP and lightheadedness after HD.   - He has a wide LBBB, but  given HD and infection risk (and St Jude device is on same side as AV fistula) as well as minimal symptoms, would not plan CRT upgrade at this time.  - I will arrange for repeat echo at followup in 6 months.  2. Atrial fibrillation: He is in NSR today on amiodarone.  - Continue amiodarone 200 mg daily.  Check LFTs today.  He will need a regular eye exam.  Appeared to be sick euthyroid at last check, repeat TFTs today.  - Continue warfarin for anticoagulation.  3. Diabetes: Per PCP.  4. VT: Now on amiodarone.  5. ESRD: Per nephrology.   Followup in 6 months with echo.   Loralie Champagne, MD  07/11/2019

## 2019-07-15 ENCOUNTER — Other Ambulatory Visit (HOSPITAL_COMMUNITY): Payer: Self-pay | Admitting: Physician Assistant

## 2019-07-15 ENCOUNTER — Other Ambulatory Visit: Payer: Self-pay | Admitting: Cardiology

## 2019-07-15 MED FILL — AMIODARONE HCL 200 MG TAB: 200 | 90 days supply | Qty: 90 | Fill #0

## 2019-07-24 LAB — HEMOGLOBIN A1C: Hemoglobin A1C: 6.2

## 2019-07-24 LAB — HM HEPATITIS C SCREENING LAB: HM Hepatitis Screen: NEGATIVE

## 2019-07-24 LAB — HM HIV SCREENING LAB: HM HIV Screening: NEGATIVE

## 2019-07-28 MED FILL — WARFARIN SODIUM 5 MG TABLET: 5 | 30 days supply | Qty: 35 | Fill #0

## 2019-07-31 ENCOUNTER — Other Ambulatory Visit: Payer: Self-pay

## 2019-07-31 ENCOUNTER — Other Ambulatory Visit (HOSPITAL_COMMUNITY): Payer: Self-pay | Admitting: Cardiology

## 2019-07-31 ENCOUNTER — Ambulatory Visit (INDEPENDENT_AMBULATORY_CARE_PROVIDER_SITE_OTHER): Payer: Medicare Other

## 2019-07-31 ENCOUNTER — Other Ambulatory Visit: Payer: Self-pay | Admitting: Cardiology

## 2019-07-31 DIAGNOSIS — Z95810 Presence of automatic (implantable) cardiac defibrillator: Secondary | ICD-10-CM

## 2019-07-31 DIAGNOSIS — I483 Typical atrial flutter: Secondary | ICD-10-CM

## 2019-07-31 DIAGNOSIS — Z5181 Encounter for therapeutic drug level monitoring: Secondary | ICD-10-CM

## 2019-07-31 DIAGNOSIS — I4819 Other persistent atrial fibrillation: Secondary | ICD-10-CM

## 2019-07-31 LAB — POCT INR: INR: 2.9 (ref 2.0–3.0)

## 2019-07-31 MED FILL — METOPROLOL SUCCINATE ER 25: 25 | 30 days supply | Qty: 30 | Fill #0

## 2019-07-31 NOTE — Patient Instructions (Signed)
Description   Continue on same dosage 1 tablet daily except for 1.5 tablets on  Thursdays.  Recheck INR in 4 weeks. Please call Coumadin Clinic with any changes in medications. [phone]

## 2019-08-06 ENCOUNTER — Ambulatory Visit: Payer: Medicare Other | Admitting: Internal Medicine

## 2019-08-13 LAB — HEMOGLOBIN A1C: Hemoglobin A1C: 6.1

## 2019-08-14 ENCOUNTER — Ambulatory Visit (INDEPENDENT_AMBULATORY_CARE_PROVIDER_SITE_OTHER): Payer: Medicare Other | Admitting: *Deleted

## 2019-08-14 ENCOUNTER — Ambulatory Visit: Payer: Medicare Other | Admitting: Internal Medicine

## 2019-08-14 DIAGNOSIS — I428 Other cardiomyopathies: Secondary | ICD-10-CM

## 2019-08-14 LAB — CUP PACEART REMOTE DEVICE CHECK
Battery Remaining Longevity: 55 mo
Battery Remaining Percentage: 52 %
Battery Voltage: 2.92 V
Brady Statistic RV Percent Paced: 1 %
Date Time Interrogation Session: 20210603020026
HighPow Impedance: 67 Ohm
HighPow Impedance: 68 Ohm
Implantable Lead Implant Date: 20090211
Implantable Lead Location: 753860
Implantable Lead Model: 7121
Implantable Pulse Generator Implant Date: 20150617
Lead Channel Impedance Value: 410 Ohm
Lead Channel Pacing Threshold Amplitude: 1 V
Lead Channel Pacing Threshold Pulse Width: 0.5 ms
Lead Channel Sensing Intrinsic Amplitude: 10.5 mV
Lead Channel Setting Pacing Amplitude: 2.5 V
Lead Channel Setting Pacing Pulse Width: 0.5 ms
Lead Channel Setting Sensing Sensitivity: 0.5 mV
Pulse Gen Serial Number: 7200640

## 2019-08-19 NOTE — Progress Notes (Signed)
Remote ICD transmission.   

## 2019-08-29 ENCOUNTER — Telehealth (HOSPITAL_COMMUNITY): Payer: Self-pay | Admitting: *Deleted

## 2019-08-29 NOTE — Telephone Encounter (Signed)
Levander Campion with [facility] left VM requesting a call from a provider about medication titration for patient.   Routed to [place] (Dr.McLean is on vacation)  Call back # [phone]

## 2019-08-29 NOTE — Telephone Encounter (Signed)
FYI  I returned call to this number. No answer.   Maurisha Mongeau NP-C  1:00 PM

## 2019-09-01 ENCOUNTER — Telehealth: Payer: Self-pay

## 2019-09-01 MED FILL — WARFARIN SODIUM 5 MG TABLET: 5 | 30 days supply | Qty: 35 | Fill #1

## 2019-09-01 MED FILL — METOPROLOL SUCCINATE ER 25: 25 | 30 days supply | Qty: 30 | Fill #1

## 2019-09-01 NOTE — Telephone Encounter (Signed)

## 2019-09-02 ENCOUNTER — Ambulatory Visit (INDEPENDENT_AMBULATORY_CARE_PROVIDER_SITE_OTHER): Payer: Medicare Other | Admitting: *Deleted

## 2019-09-02 ENCOUNTER — Encounter: Payer: Self-pay | Admitting: Internal Medicine

## 2019-09-02 ENCOUNTER — Other Ambulatory Visit: Payer: Self-pay

## 2019-09-02 ENCOUNTER — Ambulatory Visit (INDEPENDENT_AMBULATORY_CARE_PROVIDER_SITE_OTHER): Payer: Medicare Other | Admitting: Internal Medicine

## 2019-09-02 VITALS — BP 81/58 | HR 70 | Temp 97.3°F | Resp 17 | Wt 235.0 lb

## 2019-09-02 DIAGNOSIS — E1159 Type 2 diabetes mellitus with other circulatory complications: Secondary | ICD-10-CM | POA: Diagnosis not present

## 2019-09-02 DIAGNOSIS — Z5181 Encounter for therapeutic drug level monitoring: Secondary | ICD-10-CM | POA: Diagnosis not present

## 2019-09-02 DIAGNOSIS — I4819 Other persistent atrial fibrillation: Secondary | ICD-10-CM | POA: Diagnosis not present

## 2019-09-02 DIAGNOSIS — Z794 Long term (current) use of insulin: Secondary | ICD-10-CM

## 2019-09-02 DIAGNOSIS — I482 Chronic atrial fibrillation, unspecified: Secondary | ICD-10-CM | POA: Diagnosis not present

## 2019-09-02 DIAGNOSIS — I483 Typical atrial flutter: Secondary | ICD-10-CM

## 2019-09-02 DIAGNOSIS — I10 Essential (primary) hypertension: Secondary | ICD-10-CM

## 2019-09-02 DIAGNOSIS — E785 Hyperlipidemia, unspecified: Secondary | ICD-10-CM

## 2019-09-02 DIAGNOSIS — Z95810 Presence of automatic (implantable) cardiac defibrillator: Secondary | ICD-10-CM

## 2019-09-02 DIAGNOSIS — E1169 Type 2 diabetes mellitus with other specified complication: Secondary | ICD-10-CM | POA: Diagnosis not present

## 2019-09-02 LAB — POCT INR: INR: 2.5 (ref 2.0–3.0)

## 2019-09-02 NOTE — Patient Instructions (Signed)
Description   Continue taking Warfarin 1 tablet daily except for 1.5 tablets on  Thursdays.  Recheck INR in 6 weeks. Please call Coumadin Clinic with any changes in medications. [phone]

## 2019-09-02 NOTE — Progress Notes (Signed)
Subjective:    Travis Bryan - 49 y.o. male MRN [id number]  Date of birth: 1970/03/25  HPI  Travis Bryan is here for follow up of chronic medical conditions.  Diabetes mellitus, Type 2 Disease Monitoring             Blood Sugar Ranges: Does not monitor              Polyuria: no             Visual problems: no   Urine Microalbumin 206   Last A1C: 6.1 (June 2021)   Medications: Lantus and Novolog  Medication Compliance: no, patient reports he doesn't really take his insulin because he doesn't feel like he is hyper or hypoglycemic. The last time he took his insulin was last year or the year before it.  Medication Side Effects             Hypoglycemia: no    Health Maintenance:  Health Maintenance Due  Topic Date Due  . OPHTHALMOLOGY EXAM  Never done  . COVID-19 Vaccine (1) Never done  . TETANUS/TDAP  04/14/2019    -  reports that he has never smoked. He has never used smokeless tobacco. - Review of Systems: Per HPI. - Past Medical History: Patient Active Problem List   Diagnosis Date Noted  . Retroperitoneal lymphadenopathy 12/24/2018  . ESRD on dialysis ([place]) 12/24/2018  . Rectal bleed 09/07/2018  . Hypotension 09/07/2018  . Anemia of chronic disease   . PAF (paroxysmal atrial fibrillation) ([place])   . DKA (diabetic ketoacidosis) ([name]) 02/25/2018  . Hyperlipidemia 02/25/2018  . Atrial fibrillation ([place]) 02/25/2018  . CAD (coronary artery disease) 02/25/2018  . Type 2 diabetes mellitus with complication, with long-term current use of insulin ([place]) 12/26/2017  . At risk for sexually transmitted disease due to unprotected sex 08/23/2017  . Persistent atrial fibrillation ([name])   . CARDIOMYOPATHY, SECONDARY 07/03/2008  . HFrEF (heart failure with reduced ejection fraction) ([place]) 07/03/2008  . Automatic implantable cardioverter-defibrillator in situ 07/03/2008  . Type 2 diabetes mellitus ([name]) 12/26/2005  . Hyperlipidemia associated with type 2 diabetes mellitus ([name])  12/26/2005  . Hypertension associated with diabetes ([name]) 12/26/2005   - Medications: reviewed and updated   Objective:   Physical Exam BP (!) 81/58   Pulse 70   Temp (!) 97.3 F (36.3 C) (Temporal)   Resp 17   Wt 235 lb (106.6 kg)   SpO2 95%   BMI 31.87 kg/m  Physical Exam Constitutional:      General: He is not in acute distress.    Appearance: He is not diaphoretic.  Cardiovascular:     Rate and Rhythm: Normal rate.  Pulmonary:     Effort: Pulmonary effort is normal. No respiratory distress.  Musculoskeletal:        General: Normal range of motion.  Skin:    General: Skin is warm and dry.  Neurological:     Mental Status: He is alert and oriented to person, place, and time.  Psychiatric:        Mood and Affect: Affect normal.        Judgment: Judgment normal.            Assessment & Plan:   1. Type 2 diabetes mellitus with other circulatory complication, with long-term current use of insulin (HCC) A1c is well controlled. Patient does not monitor CBGs nor does he take insulin. Discussed that since A1c has remained stable, will d/c insulin as he is  non compliant anyways. Monitor CBGs occasionally at home and if has any symptoms. Return in 3 months for DM f/u.  - HM Diabetes Foot Exam  2. Essential hypertension Patient is actually hypotensive at today's office visit. Just finished HD. He reports being asymptomatic. Prior BP trend has not been this low. If persists, would consider decreasing dose of Metoprolol although would need to monitor to ensure pulse stays stable with h/o Afib.   3. Hyperlipidemia associated with type 2 diabetes mellitus (HCC) On Lipitor 40 mg, continue.   4. Chronic atrial fibrillation (HCC) On Amiodarone and Metoprolol. Pulse is stable. Patient compliant with Coumadin. INR at goal with result of 2.5 earlier today.      Travis Bryan, D.O. 09/03/2019, 8:46 AM Primary Care at [facility]

## 2019-10-02 MED FILL — WARFARIN SODIUM 5 MG TABLET: 5 | 30 days supply | Qty: 35 | Fill #2

## 2019-10-02 MED FILL — METOPROLOL SUCCINATE ER 25: 25 | 30 days supply | Qty: 30 | Fill #2

## 2019-10-14 ENCOUNTER — Other Ambulatory Visit: Payer: Self-pay

## 2019-10-14 ENCOUNTER — Ambulatory Visit (INDEPENDENT_AMBULATORY_CARE_PROVIDER_SITE_OTHER): Payer: Medicare Other | Admitting: *Deleted

## 2019-10-14 DIAGNOSIS — Z5181 Encounter for therapeutic drug level monitoring: Secondary | ICD-10-CM

## 2019-10-14 DIAGNOSIS — I4819 Other persistent atrial fibrillation: Secondary | ICD-10-CM | POA: Diagnosis not present

## 2019-10-14 DIAGNOSIS — Z95810 Presence of automatic (implantable) cardiac defibrillator: Secondary | ICD-10-CM | POA: Diagnosis not present

## 2019-10-14 DIAGNOSIS — I483 Typical atrial flutter: Secondary | ICD-10-CM | POA: Diagnosis not present

## 2019-10-14 LAB — POCT INR: INR: 2.6 (ref 2.0–3.0)

## 2019-10-14 MED FILL — AMIODARONE HCL 200 MG TAB: 200 | 90 days supply | Qty: 90 | Fill #1

## 2019-10-14 NOTE — Patient Instructions (Addendum)
Description   Continue taking Warfarin 1 tablet daily except for 1.5 tablets on Thursdays.  Recheck INR in 8 weeks. Please call Coumadin Clinic with any changes in medications. [phone]

## 2019-11-04 ENCOUNTER — Other Ambulatory Visit: Payer: Self-pay | Admitting: Cardiology

## 2019-11-04 MED FILL — METOPROLOL SUCCINATE ER 25: 25 | 30 days supply | Qty: 30 | Fill #3

## 2019-11-05 MED FILL — WARFARIN SODIUM 5 MG TABLET: 5 | 30 days supply | Qty: 35 | Fill #0

## 2019-11-13 ENCOUNTER — Ambulatory Visit (INDEPENDENT_AMBULATORY_CARE_PROVIDER_SITE_OTHER): Payer: Medicare Other | Admitting: *Deleted

## 2019-11-13 DIAGNOSIS — I428 Other cardiomyopathies: Secondary | ICD-10-CM | POA: Diagnosis not present

## 2019-11-14 LAB — CUP PACEART REMOTE DEVICE CHECK
Battery Remaining Longevity: 53 mo
Battery Remaining Percentage: 50 %
Battery Voltage: 2.92 V
Brady Statistic RV Percent Paced: 1 %
Date Time Interrogation Session: 20210902030046
HighPow Impedance: 68 Ohm
HighPow Impedance: 68 Ohm
Implantable Lead Implant Date: 20090211
Implantable Lead Location: 753860
Implantable Lead Model: 7121
Implantable Pulse Generator Implant Date: 20150617
Lead Channel Impedance Value: 440 Ohm
Lead Channel Pacing Threshold Amplitude: 1 V
Lead Channel Pacing Threshold Pulse Width: 0.5 ms
Lead Channel Sensing Intrinsic Amplitude: 10.5 mV
Lead Channel Setting Pacing Amplitude: 2.5 V
Lead Channel Setting Pacing Pulse Width: 0.5 ms
Lead Channel Setting Sensing Sensitivity: 0.5 mV
Pulse Gen Serial Number: 7200640

## 2019-11-18 NOTE — Progress Notes (Signed)
Remote ICD transmission.   

## 2019-12-05 ENCOUNTER — Other Ambulatory Visit: Payer: Self-pay | Admitting: Internal Medicine

## 2019-12-05 ENCOUNTER — Other Ambulatory Visit: Payer: Self-pay

## 2019-12-05 DIAGNOSIS — E1169 Type 2 diabetes mellitus with other specified complication: Secondary | ICD-10-CM

## 2019-12-05 DIAGNOSIS — E785 Hyperlipidemia, unspecified: Secondary | ICD-10-CM

## 2019-12-05 MED ORDER — ATORVASTATIN CALCIUM 40 MG PO TABS: 40.0000 mg | ORAL_TABLET | Freq: Every day | ORAL | 1 refills | Status: DC

## 2019-12-05 MED FILL — ATORVASTATIN CALCIUM 40 MG: 40 | 90 days supply | Qty: 90 | Fill #0

## 2019-12-05 MED FILL — METOPROLOL SUCCINATE ER 25: 25 | 30 days supply | Qty: 30 | Fill #4

## 2019-12-09 ENCOUNTER — Ambulatory Visit (INDEPENDENT_AMBULATORY_CARE_PROVIDER_SITE_OTHER): Payer: Medicare Other | Admitting: Pharmacist

## 2019-12-09 ENCOUNTER — Telehealth (INDEPENDENT_AMBULATORY_CARE_PROVIDER_SITE_OTHER): Payer: Medicare Other | Admitting: Internal Medicine

## 2019-12-09 ENCOUNTER — Other Ambulatory Visit: Payer: Self-pay

## 2019-12-09 ENCOUNTER — Encounter: Payer: Self-pay | Admitting: Internal Medicine

## 2019-12-09 DIAGNOSIS — Z5181 Encounter for therapeutic drug level monitoring: Secondary | ICD-10-CM

## 2019-12-09 DIAGNOSIS — I483 Typical atrial flutter: Secondary | ICD-10-CM

## 2019-12-09 DIAGNOSIS — H01001 Unspecified blepharitis right upper eyelid: Secondary | ICD-10-CM

## 2019-12-09 DIAGNOSIS — E1169 Type 2 diabetes mellitus with other specified complication: Secondary | ICD-10-CM

## 2019-12-09 DIAGNOSIS — Z794 Long term (current) use of insulin: Secondary | ICD-10-CM

## 2019-12-09 DIAGNOSIS — I10 Essential (primary) hypertension: Secondary | ICD-10-CM

## 2019-12-09 DIAGNOSIS — I4819 Other persistent atrial fibrillation: Secondary | ICD-10-CM | POA: Diagnosis not present

## 2019-12-09 DIAGNOSIS — Z992 Dependence on renal dialysis: Secondary | ICD-10-CM

## 2019-12-09 DIAGNOSIS — N186 End stage renal disease: Secondary | ICD-10-CM | POA: Diagnosis not present

## 2019-12-09 DIAGNOSIS — Z95810 Presence of automatic (implantable) cardiac defibrillator: Secondary | ICD-10-CM

## 2019-12-09 DIAGNOSIS — E1159 Type 2 diabetes mellitus with other circulatory complications: Secondary | ICD-10-CM | POA: Diagnosis not present

## 2019-12-09 DIAGNOSIS — E785 Hyperlipidemia, unspecified: Secondary | ICD-10-CM

## 2019-12-09 LAB — POCT INR: INR: 1.7 — AB (ref 2.0–3.0)

## 2019-12-09 MED ORDER — BACITRACIN-POLYMYXIN B 500-10000 UNIT/GM OP OINT: 1.0000 "application " | TOPICAL_OINTMENT | Freq: Two times a day (BID) | OPHTHALMIC | 0 refills | Status: DC

## 2019-12-09 NOTE — Progress Notes (Signed)
Established Patient Office Visit  Subjective:  Patient ID: [place], male    DOB: 10-10-1970  Age: 49 y.o. MRN: [id number]  CC:  Chief Complaint  Patient presents with  . Follow-up    DM/BP    Virtual Visit via Telephone Note  I connected with [name] on 12/09/19 at 10:30 AM EDT by telephone and verified that I am speaking with the correct person using two identifiers.  Location: Patient: Home Provider: Working remotely from home   I discussed the limitations, risks, security and privacy concerns of performing an evaluation and management service by telephone and the availability of in person appointments. I also discussed with the patient that there may be a patient responsible charge related to this service. The patient expressed understanding and agreed to proceed.   History of Present Illness:  1. Type 2 diabetes mellitus with other circulatory complication, with long-term current use of insulin ([place]) Patient reports that he has been taking medications as directed, does not check blood glucose levels at home.  2. Essential hypertension Per office visit note from September 02, 2019, patient was hypotensive at that visit, patient does not check blood pressure at home, denies any episodes of hypotensive symptoms.  3. Hyperlipidemia associated with type 2 diabetes mellitus ([place]) On Lipitor 40 mg, last fasting lipid was October 2020   Patient states that he has been having a "stye" on and off on his right upper lid for the past couple of months.  Reports it is tender to touch, does have a head.  Reports that he has been using warm compresses with some relief.  Reports that his girlfriend will use a sterilized needle to pop it with some relief.  Reports that it continues to come back, states he currently has one present.  Reports that he did see an eye doctor in [place] who prescribed an antibiotic cream, unfortunately he did not pick up the medication due  to financial constraints.  Denies change in vision, discharge.  Denies injury or trauma to eye  States that he had a positive Covid test at dialysis approximately 2 to 3 weeks ago, states that he was asymptomatic.    Observations/Objective: Medical history and current medications reviewed, no physical exam completed   Past Medical History:  Diagnosis Date  . AICD (automatic cardioverter/defibrillator) present   . Atrial flutter ([place])    a. s/p ablation 03/2016  . Chronic kidney disease   . Chronic systolic CHF (congestive heart failure) ([name])   . HCAP (healthcare-associated pneumonia) 03/28/2018  . History of gout   . Hyperlipidemia   . Hypertension   . Myocardial infarction [facility])    "I think I had one a long long time ago" (03/28/2016)  . NICM (nonischemic cardiomyopathy) ([name])    a. LHC 6/06: pLAD 20, pLCx 20-30; b. Echo 5/15:  EF 15%, diffuse HK, restrictive physiology, trivial AI, trivial MR, mild LAE, moderate RVE, moderately reduced RVSF, moderate RAE, mild to moderate TR, PASP 43 mmHg  . Obesity   . Persistent atrial fibrillation ([name])   . Stroke ([place])   . Type II diabetes mellitus ([place])     Past Surgical History:  Procedure Laterality Date  . AV FISTULA PLACEMENT Left 04/25/2018   Procedure: ARTERIOVENOUS (AV) VS GRAFT ARM;  Surgeon: Serafina Mitchell, MD;  Location: [place];  Service: Vascular;  Laterality: Left;  . BASCILIC VEIN TRANSPOSITION Left 07/19/2018   Procedure: SECOND STAGE BASILIC VEIN TRANSPOSITION LEFT ARM;  Surgeon:  Serafina Mitchell, MD;  [facility] OR;  Service: Vascular;  Laterality: Left;  . CARDIOVERSION N/A 04/07/2016   Procedure: CARDIOVERSION;  Surgeon: Thayer Headings, MD;  Location: [place];  Service: Cardiovascular;  Laterality: N/A;  . CARDIOVERSION N/A 04/11/2016   Procedure: CARDIOVERSION;  Surgeon: Larey Dresser, MD;  Location: [place];  Service: Cardiovascular;  Laterality: N/A;  . CARDIOVERSION N/A 09/25/2018   Procedure:  CARDIOVERSION;  Surgeon: Buford Dresser, MD;  Location: [facility] ENDOSCOPY;  Service: Cardiovascular;  Laterality: N/A;  . COLONOSCOPY WITH PROPOFOL N/A 09/10/2018   Procedure: COLONOSCOPY WITH PROPOFOL;  Surgeon: Otis Brace, MD;  Location: [place];  Service: Gastroenterology;  Laterality: N/A;  . ELECTROPHYSIOLOGIC STUDY N/A 03/31/2016   Procedure: A-Flutter Ablation;  Surgeon: Evans Lance, MD;  Location: [place] CV LAB;  Service: Cardiovascular;  Laterality: N/A;  . ESOPHAGOGASTRODUODENOSCOPY (EGD) WITH PROPOFOL N/A 09/10/2018   Procedure: ESOPHAGOGASTRODUODENOSCOPY (EGD) WITH PROPOFOL;  Surgeon: Otis Brace, MD;  Location: [place];  Service: Gastroenterology;  Laterality: N/A;  . EXCHANGE OF A DIALYSIS CATHETER Left 04/25/2018   Procedure: Attempted Exchange Of A Dialysis Catheter on the Left side;  Surgeon: Serafina Mitchell, MD;  Location: [facility] OR;  Service: Vascular;  Laterality: Left;  . IMPLANTABLE CARDIOVERTER DEFIBRILLATOR (ICD) GENERATOR CHANGE N/A 08/27/2013   Procedure: ICD GENERATOR CHANGE;  Surgeon: Evans Lance, MD;  Location: [facility] CATH LAB;  Service: Cardiovascular;  Laterality: N/A;  . INSERTION OF DIALYSIS CATHETER Right 04/25/2018   Procedure: INSERTION OF TUNNELED DIALYSIS CATHETER;  Surgeon: Serafina Mitchell, MD;  Location: [place];  Service: Vascular;  Laterality: Right;  . POLYPECTOMY  09/10/2018   Procedure: POLYPECTOMY;  Surgeon: Otis Brace, MD;  Location: [place] ENDOSCOPY;  Service: Gastroenterology;;  . TEE WITHOUT CARDIOVERSION N/A 03/31/2016   Procedure: TRANSESOPHAGEAL ECHOCARDIOGRAM (TEE);  Surgeon: Larey Dresser, MD;  Location: [place] ENDOSCOPY;  Service: Cardiovascular;  Laterality: N/A;    Family History  Problem Relation Age of Onset  . Hypertension Mother   . Heart disease Mother   . Diabetes Mother     Social History   Socioeconomic History  . Marital status: Widowed    Spouse name: Not on file  . Number of children: Not on file   . Years of education: Not on file  . Highest education level: Not on file  Occupational History  . Not on file  Tobacco Use  . Smoking status: Never Smoker  . Smokeless tobacco: Never Used  . Tobacco comment: pt denies smoking cigars  Vaping Use  . Vaping Use: Never used  Substance and Sexual Activity  . Alcohol use: No  . Drug use: No  . Sexual activity: Not Currently  Other Topics Concern  . Not on file  Social History Narrative  . Not on file   Social Determinants of Health   Financial Resource Strain:   . Difficulty of Paying Living Expenses: Not on file  Food Insecurity:   . Worried About Charity fundraiser in the Last Year: Not on file  . Ran Out of Food in the Last Year: Not on file  Transportation Needs:   . Lack of Transportation (Medical): Not on file  . Lack of Transportation (Non-Medical): Not on file  Physical Activity:   . Days of Exercise per Week: Not on file  . Minutes of Exercise per Session: Not on file  Stress:   . Feeling of Stress : Not on file  Social Connections:   .  Frequency of Communication with Friends and Family: Not on file  . Frequency of Social Gatherings with Friends and Family: Not on file  . Attends Religious Services: Not on file  . Active Member of Clubs or Organizations: Not on file  . Attends Archivist Meetings: Not on file  . Marital Status: Not on file  Intimate Partner Violence:   . Fear of Current or Ex-Partner: Not on file  . Emotionally Abused: Not on file  . Physically Abused: Not on file  . Sexually Abused: Not on file    Outpatient Medications Prior to Visit  Medication Sig Dispense Refill  . amiodarone (PACERONE) 200 MG tablet TAKE 1 TABLET BY MOUTH DAILY. 90 tablet 3  . atorvastatin (LIPITOR) 40 MG tablet Take 1 tablet (40 mg total) by mouth daily at 6 PM. 90 tablet 1  . Insulin Syringe-Needle U-100 31G X 15/64" 0.3 ML MISC Use to inject insulin 4 times a day 200 each 12  . metoprolol succinate  (TOPROL-XL) 25 MG 24 hr tablet TAKE 1 TABLET (25 MG TOTAL) BY MOUTH DAILY. 30 tablet 6  . sevelamer carbonate (RENVELA) 800 MG tablet Take 2,400 mg by mouth with breakfast, with lunch, and with evening meal.    . warfarin (COUMADIN) 5 MG tablet TAKE 1 TABLET DAILY EXCEPT 1.5 TABLETS ON SUNDAYS AND THURSDAY OR AS DIRECTED BY ANTICOAGULATION CLINIC. 35 tablet 2   No facility-administered medications prior to visit.    No Known Allergies  ROS Review of Systems  Constitutional: Negative for chills and fever.  HENT: Negative.   Eyes: Positive for pain. Negative for discharge, redness and visual disturbance.  Respiratory: Negative.  Negative for shortness of breath.   Cardiovascular: Negative for chest pain and palpitations.  Gastrointestinal: Negative.   Endocrine: Negative.   Genitourinary: Negative.   Musculoskeletal: Negative.   Skin: Negative.   Allergic/Immunologic: Negative.   Neurological: Negative for dizziness, syncope, light-headedness and headaches.  Hematological: Negative.   Psychiatric/Behavioral: Negative.       Objective:     There were no vitals taken for this visit. Wt Readings from Last 3 Encounters:  09/02/19 235 lb (106.6 kg)  07/10/19 229 lb 12.8 oz (104.2 kg)  01/07/19 207 lb (93.9 kg)     Health Maintenance Due  Topic Date Due  . OPHTHALMOLOGY EXAM  Never done  . COVID-19 Vaccine (1) Never done  . TETANUS/TDAP  04/14/2019  . INFLUENZA VACCINE  10/12/2019  . HEMOGLOBIN A1C  11/13/2019    There are no preventive care reminders to display for this patient.  Lab Results  Component Value Date   TSH 5.875 (H) 07/10/2019   Lab Results  Component Value Date   WBC 6.3 07/10/2019   HGB 11.9 (L) 07/10/2019   HCT 36.7 (L) 07/10/2019   MCV 97.6 07/10/2019   PLT 190 07/10/2019   Lab Results  Component Value Date   NA 135 07/10/2019   K 5.4 (H) 07/10/2019   CO2 27 07/10/2019   GLUCOSE 107 (H) 07/10/2019   BUN 58 (H) 07/10/2019   CREATININE  14.50 (H) 07/10/2019   BILITOT 1.2 07/10/2019   ALKPHOS 167 (H) 07/10/2019   AST 23 07/10/2019   ALT 28 07/10/2019   PROT 8.3 (H) 07/10/2019   ALBUMIN 3.9 07/10/2019   CALCIUM 9.4 07/10/2019   ANIONGAP 14 07/10/2019   GFR 66.57 06/17/2014   Lab Results  Component Value Date   CHOL 177 01/07/2019   Lab Results  Component Value  Date   HDL 61 01/07/2019   Lab Results  Component Value Date   LDLCALC 86 01/07/2019   Lab Results  Component Value Date   TRIG 148 01/07/2019   Lab Results  Component Value Date   CHOLHDL 2.9 01/07/2019   Lab Results  Component Value Date   HGBA1C 6.1 08/13/2019      Assessment & Plan:   Problem List Items Addressed This Visit      Endocrine   Type 2 diabetes mellitus ([place]) - Primary (Chronic)   Hyperlipidemia associated with type 2 diabetes mellitus (H[facility]) (Chronic)     Genitourinary   ESRD on dialysis ([place])    Other Visit Diagnoses    Essential hypertension       Blepharitis of right upper eyelid, unspecified type       Relevant Medications   bacitracin-polymyxin b (POLYSPORIN) ophthalmic ointment       Assessment and Plan: 1. Type 2 diabetes mellitus with other circulatory complication, with long-term current use of insulin ([place]) Patient is currently living in [facility] and visits in [place] for his medical visits only.  Patient was upset to not be seen in person today, patient agreed to be seen at the mobile medicine unit in 2 weeks in [place] for full physical exam.  Fasting labs and diabetic foot exam will be completed at that time.  78-month follow-up with Dr. Juleen China for health maintenance  2. Essential hypertension   3. Hyperlipidemia associated with type 2 diabetes mellitus ([place])   4. ESRD on dialysis ([place])   5. Blepharitis of right upper eyelid, unspecified type Continue warm compresses, encourage patient to discontinue using anything to try and pop the nodule - bacitracin-polymyxin b  (POLYSPORIN) ophthalmic ointment; Place 1 application into the right eye every 12 (twelve) hours. apply to eye every 12 hours while awake  Dispense: 3.5 g; Refill: 0   Follow Up Instructions:    I discussed the assessment and treatment plan with the patient. The patient was provided an opportunity to ask questions and all were answered. The patient agreed with the plan and demonstrated an understanding of the instructions.   The patient was advised to call back or seek an in-person evaluation if the symptoms worsen or if the condition fails to improve as anticipated.  I provided 30 minutes of non-face-to-face time during this encounter.   Era Parr S Mayers, PA-C      Meds ordered this encounter  Medications  . bacitracin-polymyxin b (POLYSPORIN) ophthalmic ointment    Sig: Place 1 application into the right eye every 12 (twelve) hours. apply to eye every 12 hours while awake    Dispense:  3.5 g    Refill:  0    Order Specific Question:   Supervising Provider    Answer:   Noralyn Pick    Follow-up: Return in about 2 weeks (around 12/23/2019) for Mobile Medicine Unit.    Loraine Grip Mayers, PA-C

## 2019-12-09 NOTE — Progress Notes (Signed)
Patient verified DOB Patient has eaten and taken medication today. Patient has not checked his BP/DM at home. Patient denies any pain at this time.

## 2019-12-09 NOTE — Patient Instructions (Signed)
Take 1.5 tablet tonight then continue taking Warfarin 1 tablet daily except for 1.5 tablets on Thursdays.   Check INR 1 week after procedure. Call us if procedure is changed. Please call Coumadin Clinic with any changes in medications. [phone]

## 2019-12-10 ENCOUNTER — Other Ambulatory Visit: Payer: Medicare Other

## 2019-12-18 MED FILL — WARFARIN SODIUM 5 MG TABLET: 5 | 30 days supply | Qty: 35 | Fill #1

## 2020-01-01 MED FILL — METOPROLOL SUCCINATE ER 25: 25 | 30 days supply | Qty: 30 | Fill #5

## 2020-01-13 ENCOUNTER — Other Ambulatory Visit: Payer: Self-pay

## 2020-01-13 ENCOUNTER — Ambulatory Visit (INDEPENDENT_AMBULATORY_CARE_PROVIDER_SITE_OTHER): Payer: Medicare Other | Admitting: *Deleted

## 2020-01-13 DIAGNOSIS — I483 Typical atrial flutter: Secondary | ICD-10-CM

## 2020-01-13 DIAGNOSIS — Z95810 Presence of automatic (implantable) cardiac defibrillator: Secondary | ICD-10-CM | POA: Diagnosis not present

## 2020-01-13 DIAGNOSIS — Z5181 Encounter for therapeutic drug level monitoring: Secondary | ICD-10-CM | POA: Diagnosis not present

## 2020-01-13 DIAGNOSIS — I4819 Other persistent atrial fibrillation: Secondary | ICD-10-CM

## 2020-01-13 LAB — POCT INR: INR: 2.9 (ref 2.0–3.0)

## 2020-01-13 MED FILL — WARFARIN SODIUM 5 MG TABLET: 5 | 30 days supply | Qty: 35 | Fill #2

## 2020-01-13 MED FILL — AMIODARONE HCL 200 MG TAB: 200 | 90 days supply | Qty: 90 | Fill #2

## 2020-01-13 NOTE — Patient Instructions (Signed)
Description   Continue taking Warfarin 1 tablet daily except for 1.5 tablets on Thursdays. Recheck INR 6 week. Please call Coumadin Clinic with any changes in medications. [phone]. Please call Coumadin Clinic with any changes in medications. [phone]

## 2020-02-03 MED FILL — METOPROLOL SUCCINATE ER 25: 25 | 30 days supply | Qty: 30 | Fill #6

## 2020-02-11 IMAGING — DX DG CHEST 1V PORT
1 series · 1 of 1 positions shown · non-contrast
Comparison: 03/30/2018

CLINICAL DATA: CHF, respiratory failure, pneumonia

EXAM:
PORTABLE CHEST 1 VIEW

[chest ap]
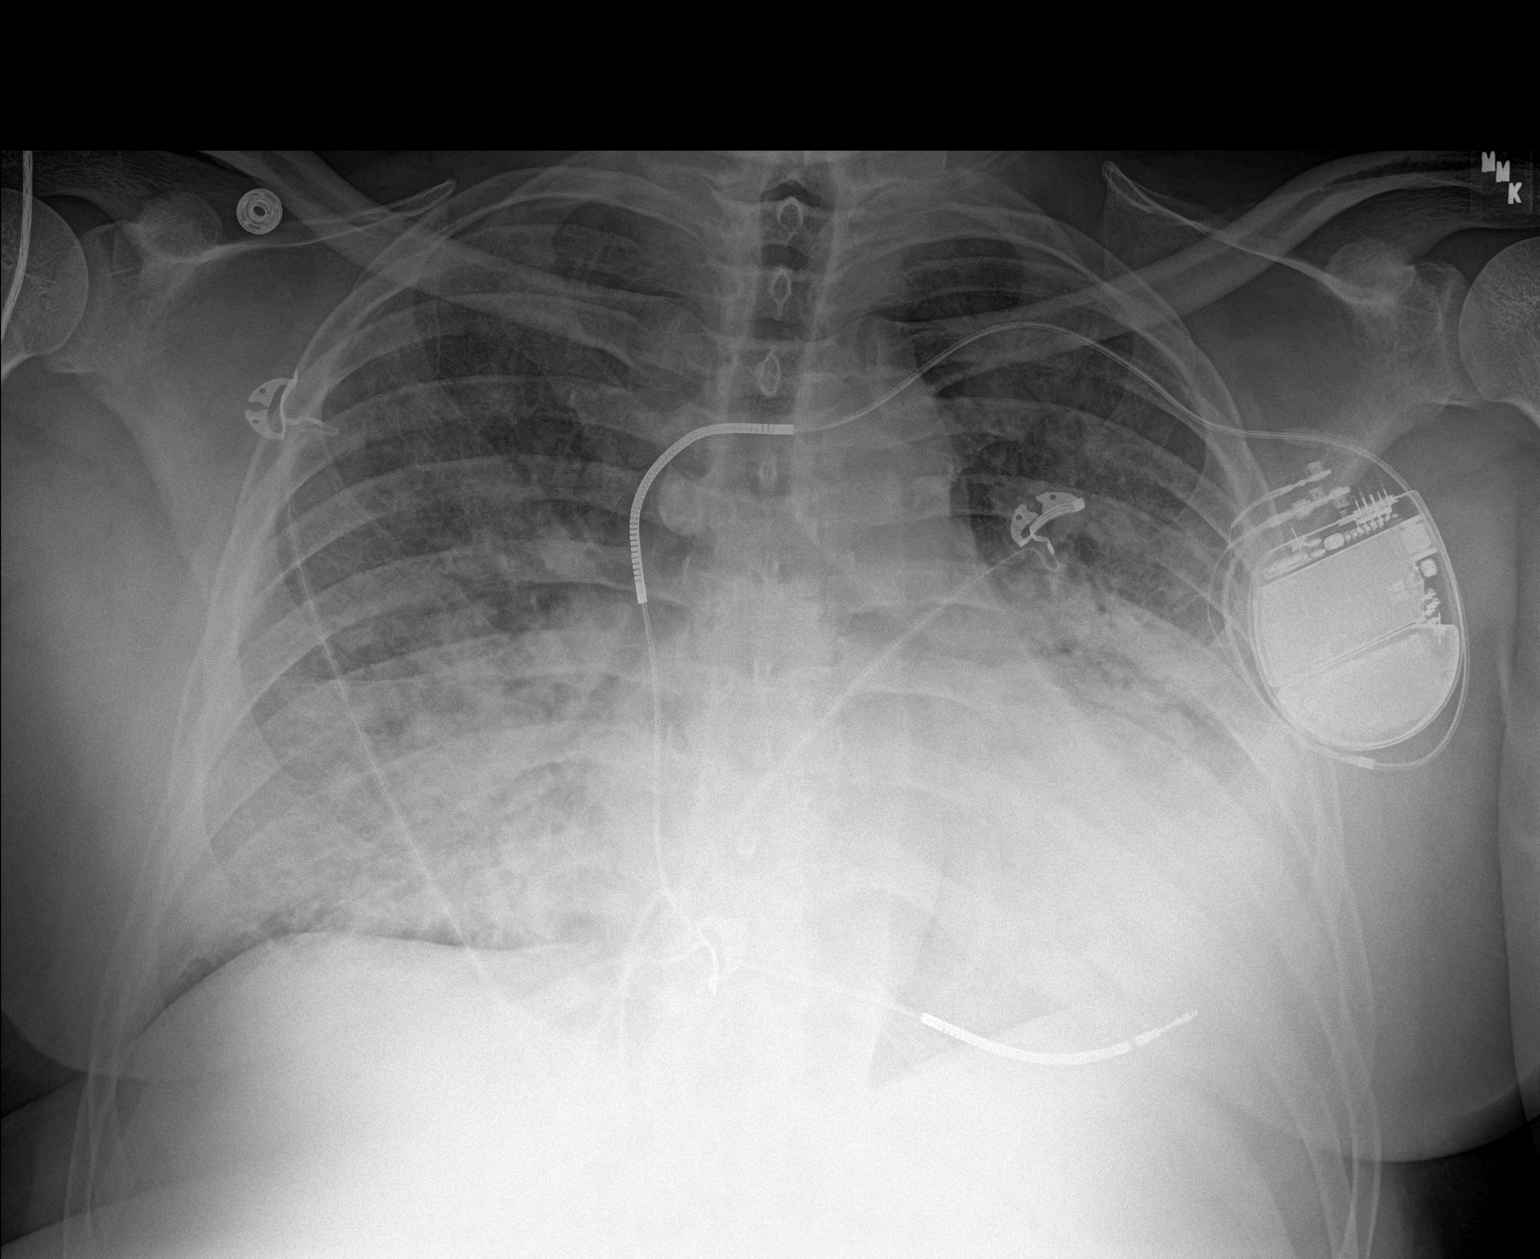

[1 of 1 positions shown; findings below may reference images not displayed]

FINDINGS: Cardiomegaly noted with AICD as before. Worsening perihilar and
lower lobe airspace consolidation with central air bronchograms.
Some improvement in the right upper lobe aeration. Appearance
remains nonspecific but compatible with a combination of CHF with
superimposed pneumonia. No enlarging effusion. No pneumothorax.
Trachea is midline.
IMPRESSION: Persistent bilateral airspace disease, slightly worse in the lower
lobes but improved in the right upper lobe, suspect component of
combined CHF/multilobar pneumonia.

## 2020-02-11 IMAGING — DX DG CHEST 1V PORT
1 series · 1 of 1 positions shown · non-contrast
Comparison: Chest radiograph 04/01/2018

CLINICAL DATA: Patient status post intubation

EXAM:
PORTABLE CHEST 1 VIEW

[chest]
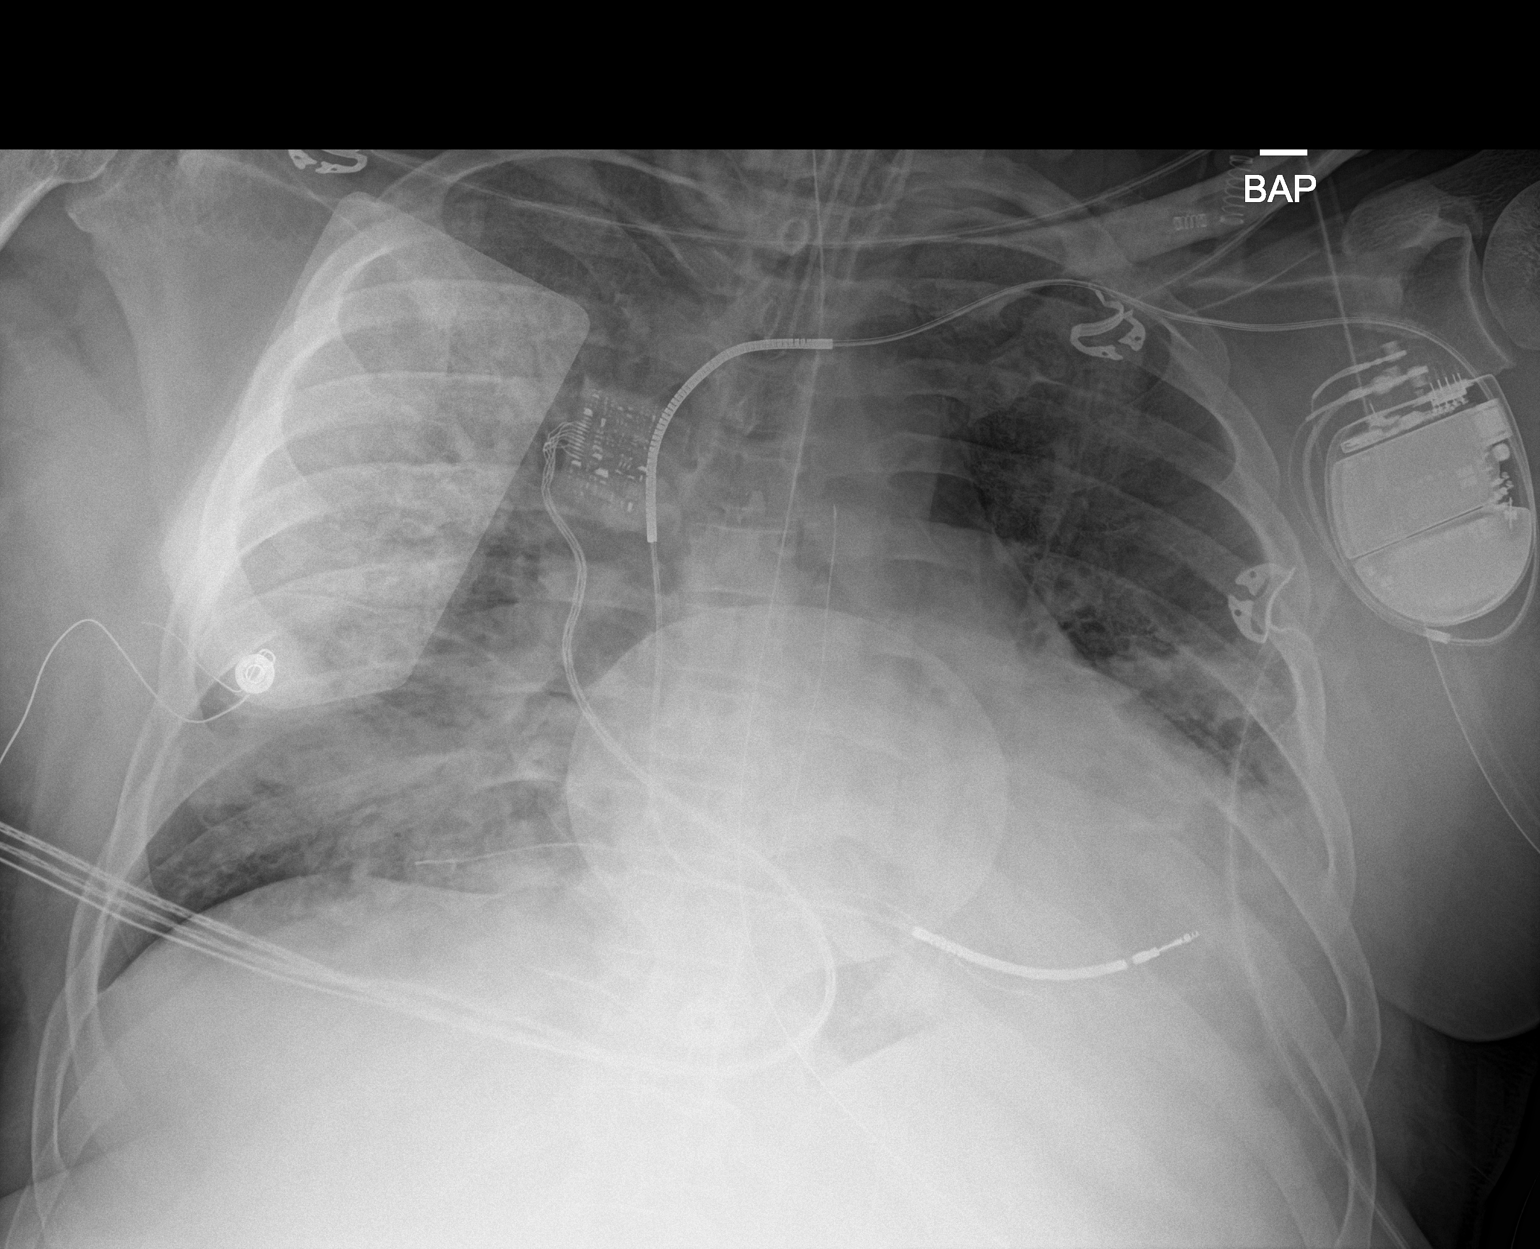

[1 of 1 positions shown; findings below may reference images not displayed]

FINDINGS: ET tube terminates in the mid trachea. Enteric tube courses inferior
to the diaphragm. Pacer pads overlie the patient. Single lead AICD
device overlies the left hemithorax, leads stable in position.
Monitoring leads overlie the patient. Stable cardiomegaly.
Persistent bilateral airspace disease, similar within the right lung
and slightly improved within the left lung. No definite
pneumothorax.
IMPRESSION: ET tube terminates in the mid trachea.

Slight interval improvement consolidation left lung with persistent
diffuse consolidation right lung.

## 2020-02-12 ENCOUNTER — Ambulatory Visit (INDEPENDENT_AMBULATORY_CARE_PROVIDER_SITE_OTHER): Payer: Medicare Other

## 2020-02-12 DIAGNOSIS — I428 Other cardiomyopathies: Secondary | ICD-10-CM | POA: Diagnosis not present

## 2020-02-12 DIAGNOSIS — I4819 Other persistent atrial fibrillation: Secondary | ICD-10-CM

## 2020-02-12 LAB — CUP PACEART REMOTE DEVICE CHECK
Battery Remaining Longevity: 52 mo
Battery Remaining Percentage: 48 %
Battery Voltage: 2.9 V
Brady Statistic RV Percent Paced: 1 %
Date Time Interrogation Session: 20211202043342
HighPow Impedance: 60 Ohm
HighPow Impedance: 61 Ohm
Implantable Lead Implant Date: 20090211
Implantable Lead Location: 753860
Implantable Lead Model: 7121
Implantable Pulse Generator Implant Date: 20150617
Lead Channel Impedance Value: 410 Ohm
Lead Channel Pacing Threshold Amplitude: 1 V
Lead Channel Pacing Threshold Pulse Width: 0.5 ms
Lead Channel Sensing Intrinsic Amplitude: 11.7 mV
Lead Channel Setting Pacing Amplitude: 2.5 V
Lead Channel Setting Pacing Pulse Width: 0.5 ms
Lead Channel Setting Sensing Sensitivity: 0.5 mV
Pulse Gen Serial Number: 7200640

## 2020-02-12 IMAGING — DX DG CHEST 1V PORT
1 series · 1 of 1 positions shown · non-contrast
Comparison: the previous day's study

CLINICAL DATA: ETT,HX DM,HTN,A-FLUTTER,CHF,FEVER,PNEUMONIA,CKD

EXAM:
PORTABLE CHEST - 1 VIEW

[chest]
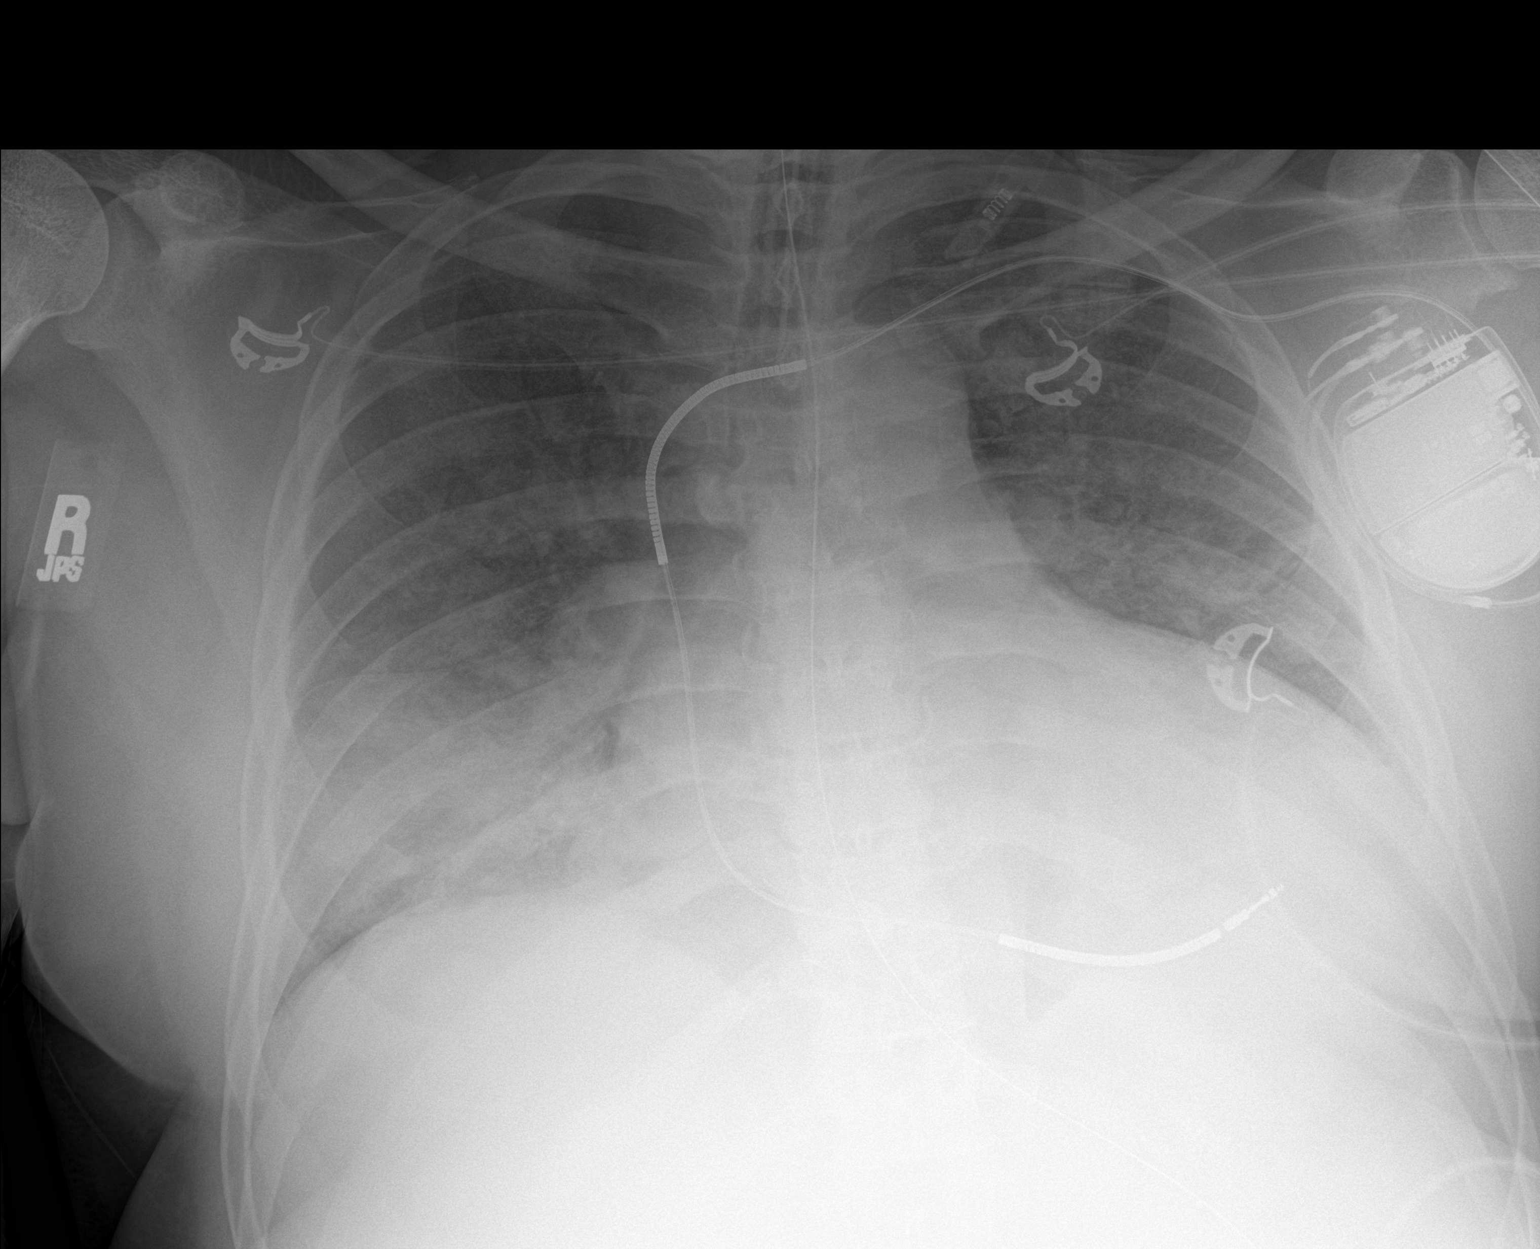

[1 of 1 positions shown; findings below may reference images not displayed]

FINDINGS: Endotracheal tube, gastric tube, left subclavian AICD stable in
position. Diffuse interstitial and alveolar edema or infiltrates may
be minimally improved.

Stable cardiomegaly.

Blunting of left lateral costophrenic angle suggesting effusion.

Visualized bones unremarkable.
IMPRESSION: 1. Slight improvement in bilateral edema/infiltrates.
2. Support hardware stable in position.

## 2020-02-12 IMAGING — DX DG ABDOMEN 1V
1 series · 1 of 1 positions shown · non-contrast
Comparison: None.

CLINICAL DATA: Abdominal distension

EXAM:
ABDOMEN - 1 VIEW

[abdomen]
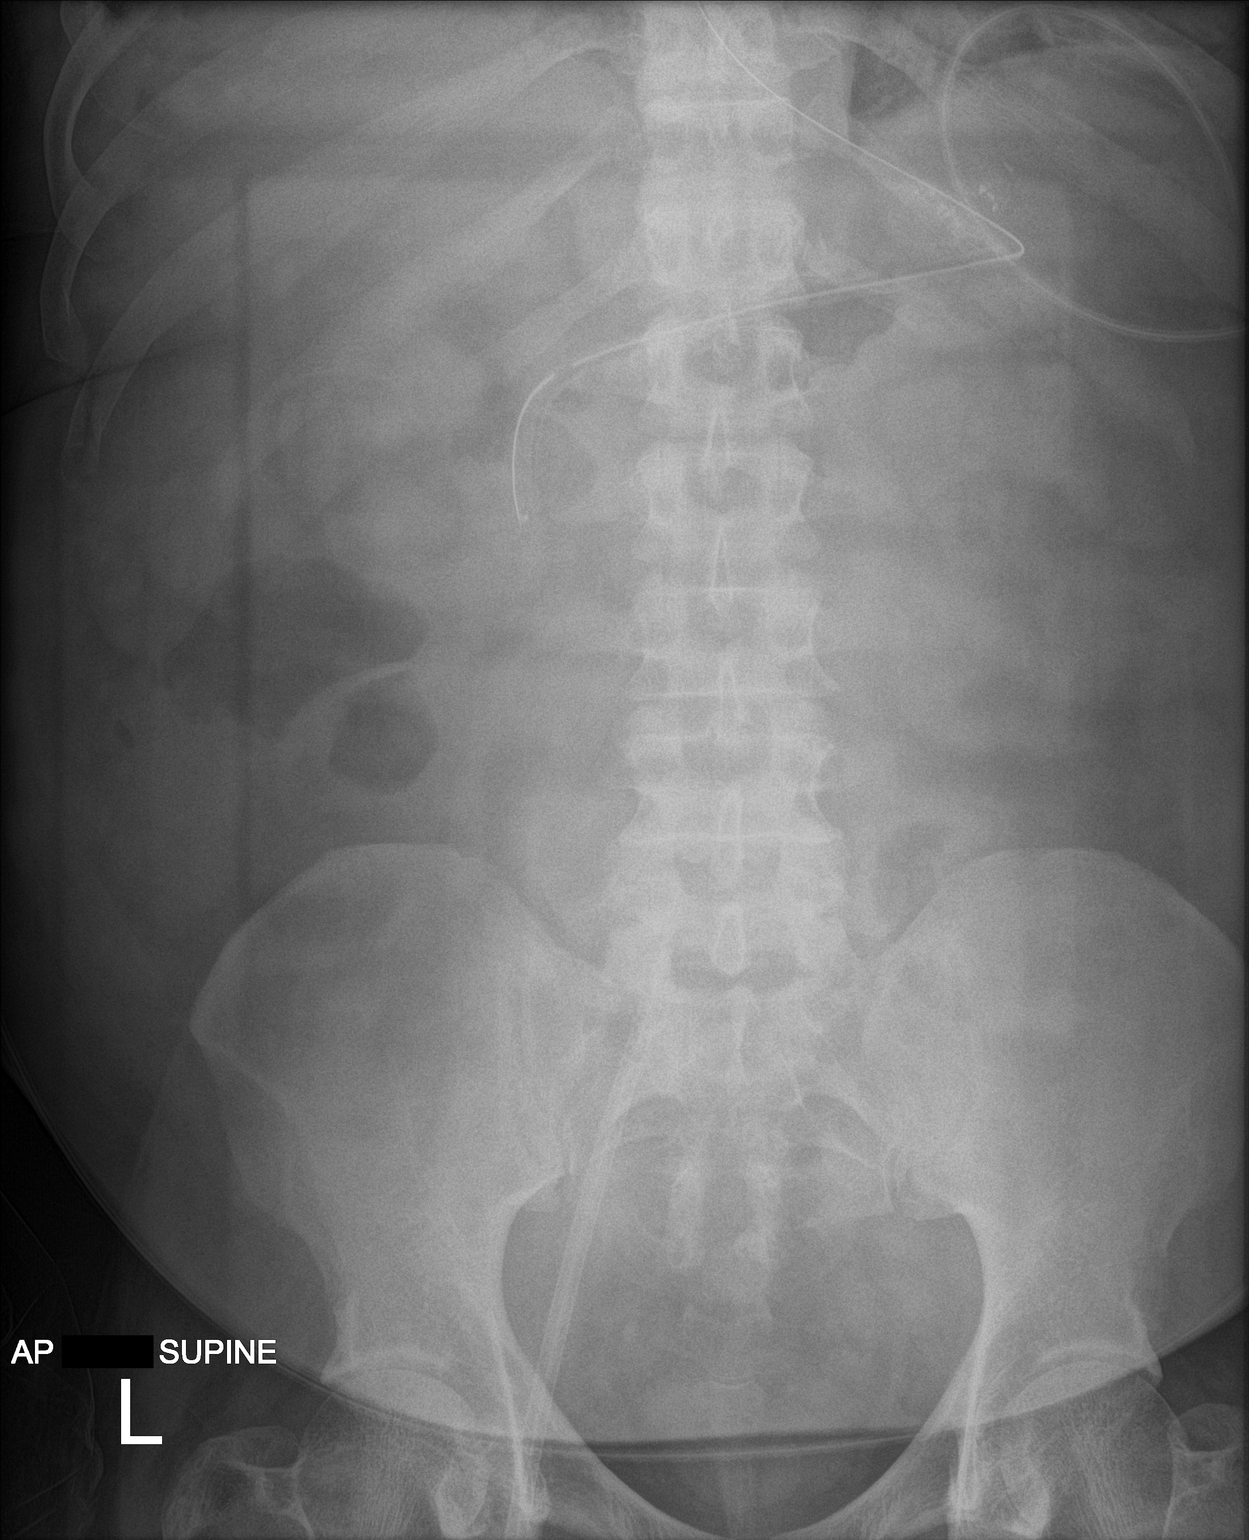

[1 of 1 positions shown; findings below may reference images not displayed]

FINDINGS: Gastric catheter is noted within the distal stomach extending into
the proximal duodenum. Right femoral venous catheter is seen. No
obstructive changes are seen. No gaseous dilatation is noted. Bony
structures are within normal limits.
IMPRESSION: Tubes and lines as described above.

No acute abnormality noted.

## 2020-02-13 IMAGING — DX DG CHEST 1V PORT
1 series · 1 of 1 positions shown · non-contrast
Comparison: 04/02/2018

CLINICAL DATA: Respiratory failure.

EXAM:
PORTABLE CHEST 1 VIEW

[chest]
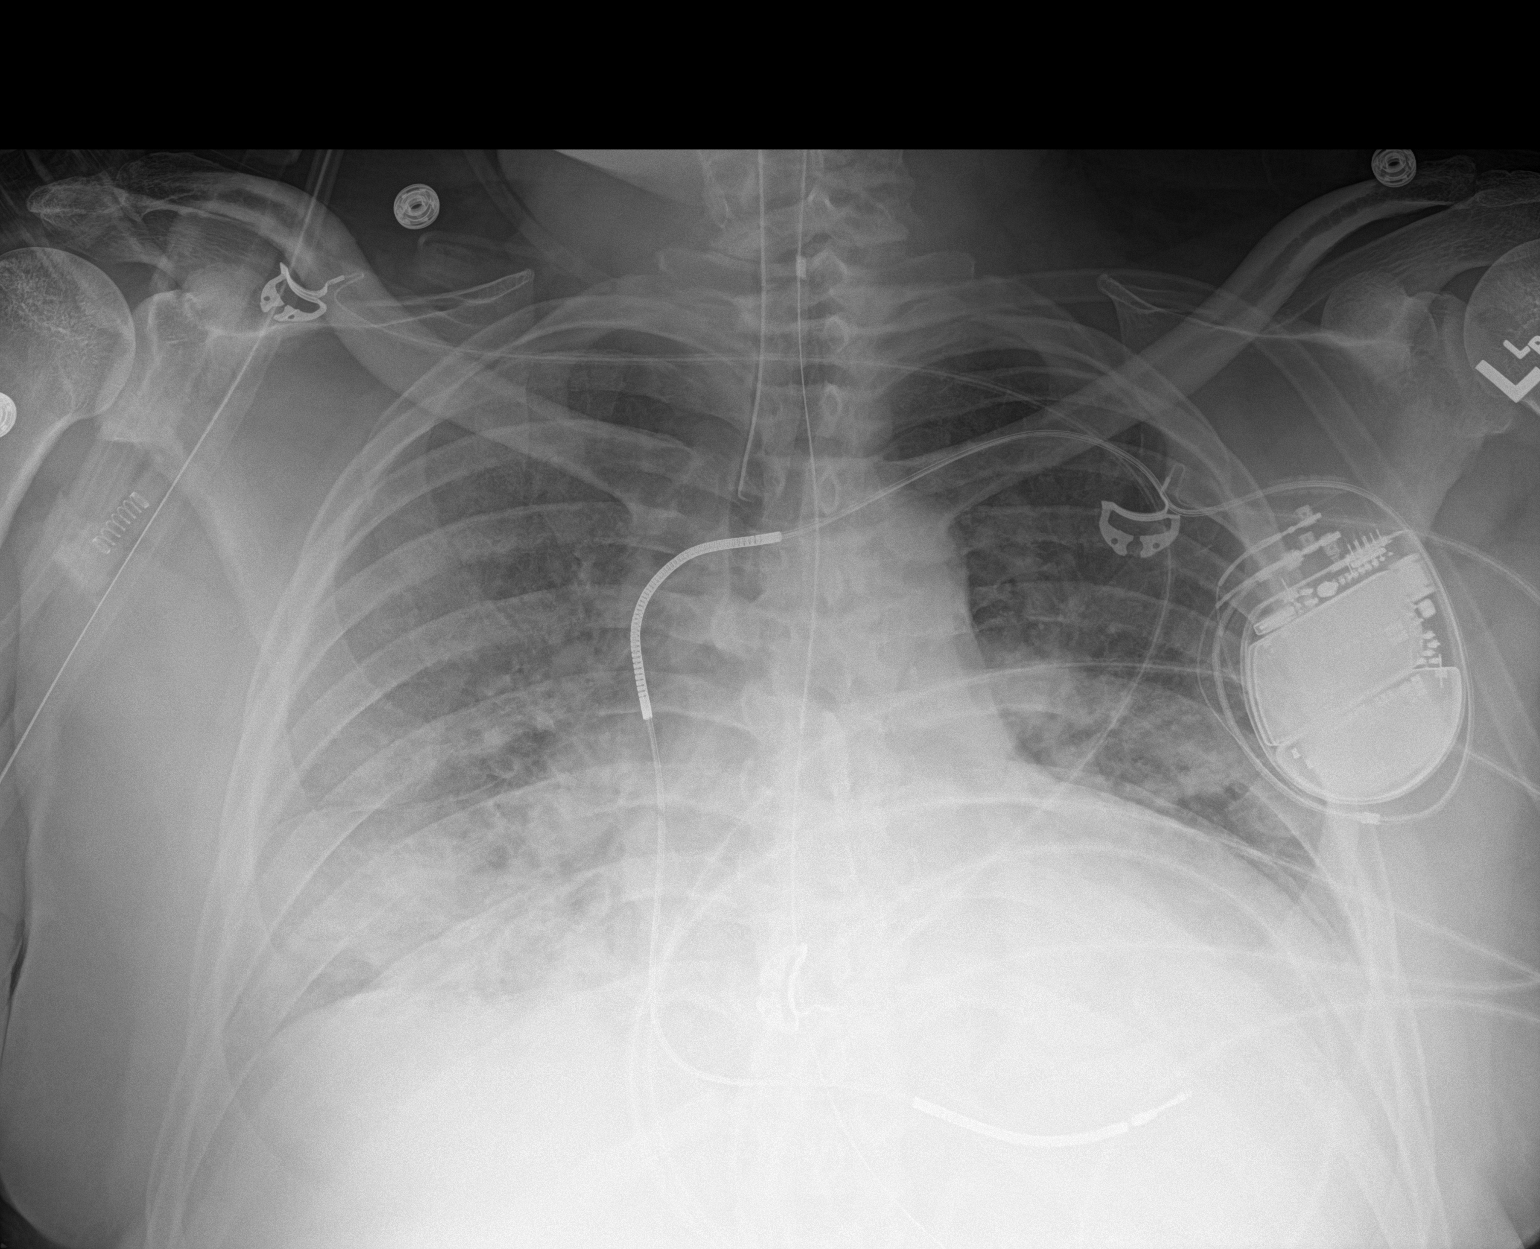

[1 of 1 positions shown; findings below may reference images not displayed]

FINDINGS: Endotracheal tube terminates at the clavicles, 4.5 cm above the
carina. Enteric tube courses into the left upper abdomen with tip
not imaged. An ICD remains in place. The cardiac silhouette remains
enlarged. Extensive airspace opacities in both lungs, greatest in
the bases, have not significantly changed. There may be small
pleural effusions. No pneumothorax is identified.
IMPRESSION: Unchanged extensive bilateral airspace disease which may reflect
edema or pneumonia.

## 2020-02-20 NOTE — Progress Notes (Signed)
Remote ICD transmission.   

## 2020-02-23 ENCOUNTER — Other Ambulatory Visit: Payer: Self-pay | Admitting: Cardiology

## 2020-02-23 MED FILL — WARFARIN SODIUM 5 MG TABLET: 5 | 30 days supply | Qty: 35 | Fill #0

## 2020-02-24 ENCOUNTER — Other Ambulatory Visit: Payer: Self-pay

## 2020-02-24 ENCOUNTER — Ambulatory Visit (INDEPENDENT_AMBULATORY_CARE_PROVIDER_SITE_OTHER): Payer: Medicare Other

## 2020-02-24 DIAGNOSIS — Z95810 Presence of automatic (implantable) cardiac defibrillator: Secondary | ICD-10-CM | POA: Diagnosis not present

## 2020-02-24 DIAGNOSIS — Z5181 Encounter for therapeutic drug level monitoring: Secondary | ICD-10-CM

## 2020-02-24 DIAGNOSIS — I4819 Other persistent atrial fibrillation: Secondary | ICD-10-CM

## 2020-02-24 DIAGNOSIS — I483 Typical atrial flutter: Secondary | ICD-10-CM | POA: Diagnosis not present

## 2020-02-24 LAB — POCT INR: INR: 2.5 (ref 2.0–3.0)

## 2020-02-24 NOTE — Patient Instructions (Signed)
Description   Continue taking Warfarin 1 tablet daily except for 1.5 tablets on Thursdays. Recheck INR 6 week. Please call Coumadin Clinic with any changes in medications. [phone]. Please call Coumadin Clinic with any changes in medications. [phone]

## 2020-02-26 MED FILL — METOPROLOL SUCCINATE ER 25: 25 | 30 days supply | Qty: 30 | Fill #0

## 2020-03-09 MED FILL — ATORVASTATIN CALCIUM 40 MG: 40 | 90 days supply | Qty: 90 | Fill #1

## 2020-03-30 MED FILL — WARFARIN SODIUM 5 MG TABLET: 5 | 30 days supply | Qty: 35 | Fill #1

## 2020-03-30 MED FILL — METOPROLOL SUCCINATE ER 25: 25 | 30 days supply | Qty: 30 | Fill #1

## 2020-04-06 ENCOUNTER — Other Ambulatory Visit: Payer: Self-pay

## 2020-04-06 ENCOUNTER — Ambulatory Visit (INDEPENDENT_AMBULATORY_CARE_PROVIDER_SITE_OTHER): Payer: Medicare Other | Admitting: *Deleted

## 2020-04-06 DIAGNOSIS — Z5181 Encounter for therapeutic drug level monitoring: Secondary | ICD-10-CM

## 2020-04-06 DIAGNOSIS — I4819 Other persistent atrial fibrillation: Secondary | ICD-10-CM

## 2020-04-06 DIAGNOSIS — Z95810 Presence of automatic (implantable) cardiac defibrillator: Secondary | ICD-10-CM | POA: Diagnosis not present

## 2020-04-06 DIAGNOSIS — I483 Typical atrial flutter: Secondary | ICD-10-CM

## 2020-04-06 LAB — POCT INR: INR: 1.4 — AB (ref 2.0–3.0)

## 2020-04-06 NOTE — Patient Instructions (Signed)
Description   Take 1.5 tablets today and tomorrow of Warfarin then continue taking Warfarin 1 tablet daily except for 1.5 tablets on Thursdays. Recheck INR in 1 week (normally 6 weeks). Please call Coumadin Clinic with any changes in medications. [phone]. Please call Coumadin Clinic with any changes in medications. [phone]

## 2020-04-15 ENCOUNTER — Telehealth: Payer: Self-pay | Admitting: *Deleted

## 2020-04-15 MED FILL — AMIODARONE HCL 200 MG TAB: 200 | 90 days supply | Qty: 90 | Fill #3

## 2020-04-15 NOTE — Telephone Encounter (Signed)
Called pt since he missed his Anticoagulation Appt today; had to leave a message for the pt to call back to reschedule.

## 2020-04-22 ENCOUNTER — Other Ambulatory Visit: Payer: Self-pay

## 2020-04-22 ENCOUNTER — Ambulatory Visit (INDEPENDENT_AMBULATORY_CARE_PROVIDER_SITE_OTHER): Payer: Medicare Other | Admitting: *Deleted

## 2020-04-22 DIAGNOSIS — Z95810 Presence of automatic (implantable) cardiac defibrillator: Secondary | ICD-10-CM | POA: Diagnosis not present

## 2020-04-22 DIAGNOSIS — I483 Typical atrial flutter: Secondary | ICD-10-CM

## 2020-04-22 DIAGNOSIS — I4819 Other persistent atrial fibrillation: Secondary | ICD-10-CM

## 2020-04-22 DIAGNOSIS — Z5181 Encounter for therapeutic drug level monitoring: Secondary | ICD-10-CM

## 2020-04-22 LAB — POCT INR: INR: 3.7 — AB (ref 2.0–3.0)

## 2020-04-22 NOTE — Patient Instructions (Signed)
Description   Hold today's dose of Warfarin then continue taking Warfarin 1 tablet daily except for 1.5 tablets on Thursdays. Recheck INR in 2 weeks. Please call Coumadin Clinic with any changes in medications. [phone]. Please call Coumadin Clinic with any changes in medications. [phone]

## 2020-05-04 ENCOUNTER — Other Ambulatory Visit: Payer: Self-pay | Admitting: Cardiology

## 2020-05-06 ENCOUNTER — Other Ambulatory Visit (HOSPITAL_COMMUNITY): Payer: Self-pay

## 2020-05-06 ENCOUNTER — Other Ambulatory Visit: Payer: Self-pay | Admitting: Cardiology

## 2020-05-06 ENCOUNTER — Other Ambulatory Visit: Payer: Self-pay

## 2020-05-06 ENCOUNTER — Ambulatory Visit (INDEPENDENT_AMBULATORY_CARE_PROVIDER_SITE_OTHER): Payer: Medicare Other | Admitting: *Deleted

## 2020-05-06 DIAGNOSIS — I4819 Other persistent atrial fibrillation: Secondary | ICD-10-CM

## 2020-05-06 DIAGNOSIS — Z95810 Presence of automatic (implantable) cardiac defibrillator: Secondary | ICD-10-CM

## 2020-05-06 DIAGNOSIS — I483 Typical atrial flutter: Secondary | ICD-10-CM | POA: Diagnosis not present

## 2020-05-06 DIAGNOSIS — Z5181 Encounter for therapeutic drug level monitoring: Secondary | ICD-10-CM | POA: Diagnosis not present

## 2020-05-06 LAB — POCT INR: INR: 3.3 — AB (ref 2.0–3.0)

## 2020-05-06 MED ORDER — METOPROLOL SUCCINATE ER 25 MG PO TB24: 25.0000 mg | ORAL_TABLET | Freq: Every day | ORAL | 0 refills | Status: DC

## 2020-05-06 MED FILL — METOPROLOL SUCCINATE ER 25: 25 | 30 days supply | Qty: 30 | Fill #0

## 2020-05-06 NOTE — Patient Instructions (Signed)
Description   Hold today's dose of warfarin then start taking warfarin 1 tablet daily Recheck INR in 2.5 weeks. Please call Coumadin Clinic with any changes in medications. [phone].

## 2020-05-06 NOTE — Telephone Encounter (Signed)
Meds ordered this encounter  Medications  . metoprolol succinate (TOPROL-XL) 25 MG 24 hr tablet    Sig: Take 1 tablet (25 mg total) by mouth daily. Must keep pending appt for further refills    Dispense:  30 tablet    Refill:  0

## 2020-05-08 ENCOUNTER — Telehealth: Payer: Self-pay | Admitting: Physician Assistant

## 2020-05-08 ENCOUNTER — Other Ambulatory Visit: Payer: Self-pay | Admitting: Physician Assistant

## 2020-05-08 MED ORDER — METOPROLOL SUCCINATE ER 25 MG PO TB24: 25.0000 mg | ORAL_TABLET | Freq: Every day | ORAL | 1 refills | Status: DC

## 2020-05-08 NOTE — Telephone Encounter (Signed)
Patient called after hours to refill Toprol-XL.  Prescription sent to [facility] at [facility] he has follow-up with Dr. Aundra Dubin next month.

## 2020-05-13 ENCOUNTER — Ambulatory Visit: Payer: Medicaid Other

## 2020-05-18 ENCOUNTER — Other Ambulatory Visit: Payer: Self-pay | Admitting: Physician Assistant

## 2020-05-18 ENCOUNTER — Other Ambulatory Visit: Payer: Self-pay

## 2020-05-18 MED ORDER — WARFARIN SODIUM 5 MG PO TABS: ORAL_TABLET | ORAL | 1 refills | Status: DC

## 2020-06-03 ENCOUNTER — Ambulatory Visit (INDEPENDENT_AMBULATORY_CARE_PROVIDER_SITE_OTHER): Payer: Medicare Other | Admitting: *Deleted

## 2020-06-03 ENCOUNTER — Encounter (HOSPITAL_COMMUNITY): Payer: Self-pay | Admitting: Cardiology

## 2020-06-03 ENCOUNTER — Ambulatory Visit (HOSPITAL_COMMUNITY)
Admission: RE | Admit: 2020-06-03 | Discharge: 2020-06-03 | Disposition: A | Discharge status: Home / Self Care | Payer: Medicare Other | Source: Ambulatory Visit | Attending: Cardiology | Admitting: Cardiology

## 2020-06-03 ENCOUNTER — Other Ambulatory Visit: Payer: Self-pay | Admitting: Cardiology

## 2020-06-03 ENCOUNTER — Other Ambulatory Visit: Payer: Self-pay

## 2020-06-03 VITALS — BP 90/58 | HR 81 | Wt 248.6 lb

## 2020-06-03 DIAGNOSIS — Z8249 Family history of ischemic heart disease and other diseases of the circulatory system: Secondary | ICD-10-CM | POA: Diagnosis not present

## 2020-06-03 DIAGNOSIS — I5020 Unspecified systolic (congestive) heart failure: Secondary | ICD-10-CM

## 2020-06-03 DIAGNOSIS — I480 Paroxysmal atrial fibrillation: Secondary | ICD-10-CM | POA: Diagnosis not present

## 2020-06-03 DIAGNOSIS — N186 End stage renal disease: Secondary | ICD-10-CM | POA: Insufficient documentation

## 2020-06-03 DIAGNOSIS — I454 Nonspecific intraventricular block: Secondary | ICD-10-CM | POA: Diagnosis not present

## 2020-06-03 DIAGNOSIS — I4819 Other persistent atrial fibrillation: Secondary | ICD-10-CM

## 2020-06-03 DIAGNOSIS — Z794 Long term (current) use of insulin: Secondary | ICD-10-CM | POA: Diagnosis not present

## 2020-06-03 DIAGNOSIS — Z95810 Presence of automatic (implantable) cardiac defibrillator: Secondary | ICD-10-CM

## 2020-06-03 DIAGNOSIS — E1122 Type 2 diabetes mellitus with diabetic chronic kidney disease: Secondary | ICD-10-CM | POA: Diagnosis not present

## 2020-06-03 DIAGNOSIS — E785 Hyperlipidemia, unspecified: Secondary | ICD-10-CM | POA: Diagnosis not present

## 2020-06-03 DIAGNOSIS — I5022 Chronic systolic (congestive) heart failure: Secondary | ICD-10-CM | POA: Diagnosis not present

## 2020-06-03 DIAGNOSIS — Z79899 Other long term (current) drug therapy: Secondary | ICD-10-CM | POA: Insufficient documentation

## 2020-06-03 DIAGNOSIS — Z7901 Long term (current) use of anticoagulants: Secondary | ICD-10-CM | POA: Insufficient documentation

## 2020-06-03 DIAGNOSIS — I4892 Unspecified atrial flutter: Secondary | ICD-10-CM | POA: Insufficient documentation

## 2020-06-03 DIAGNOSIS — I2510 Atherosclerotic heart disease of native coronary artery without angina pectoris: Secondary | ICD-10-CM | POA: Diagnosis not present

## 2020-06-03 DIAGNOSIS — Z5181 Encounter for therapeutic drug level monitoring: Secondary | ICD-10-CM | POA: Diagnosis not present

## 2020-06-03 DIAGNOSIS — I483 Typical atrial flutter: Secondary | ICD-10-CM

## 2020-06-03 DIAGNOSIS — I428 Other cardiomyopathies: Secondary | ICD-10-CM | POA: Diagnosis not present

## 2020-06-03 LAB — TSH: TSH: 5.116 u[IU]/mL — ABNORMAL HIGH (ref 0.350–4.500)

## 2020-06-03 LAB — COMPREHENSIVE METABOLIC PANEL
ALT: 25 U/L (ref 0–44)
AST: 23 U/L (ref 15–41)
Albumin: 4 g/dL (ref 3.5–5.0)
Alkaline Phosphatase: 117 U/L (ref 38–126)
Anion gap: 13 (ref 5–15)
BUN: 40 mg/dL — ABNORMAL HIGH (ref 6–20)
CO2: 32 mmol/L (ref 22–32)
Calcium: 10 mg/dL (ref 8.9–10.3)
Chloride: 93 mmol/L — ABNORMAL LOW (ref 98–111)
Creatinine, Ser: 14.02 mg/dL — ABNORMAL HIGH (ref 0.61–1.24)
GFR, Estimated: 4 mL/min — ABNORMAL LOW (ref >60)
Glucose, Bld: 138 mg/dL — ABNORMAL HIGH (ref 70–99)
Potassium: 4.9 mmol/L (ref 3.5–5.1)
Sodium: 138 mmol/L (ref 135–145)
Total Bilirubin: 1.3 mg/dL — ABNORMAL HIGH (ref 0.3–1.2)
Total Protein: 8.3 g/dL — ABNORMAL HIGH (ref 6.5–8.1)

## 2020-06-03 LAB — T4, FREE: Free T4: 1.13 ng/dL — ABNORMAL HIGH (ref 0.61–1.12)

## 2020-06-03 LAB — LIPID PANEL
Cholesterol: 157 mg/dL (ref 0–200)
HDL: 43 mg/dL (ref >40)
LDL Cholesterol: 75 mg/dL (ref 0–99)
Total CHOL/HDL Ratio: 3.7 RATIO
Triglycerides: 197 mg/dL — ABNORMAL HIGH (ref <150)
VLDL: 39 mg/dL (ref 0–40)

## 2020-06-03 LAB — CBC
HCT: 37.1 % — ABNORMAL LOW (ref 39.0–52.0)
Hemoglobin: 12.1 g/dL — ABNORMAL LOW (ref 13.0–17.0)
MCH: 33.3 pg (ref 26.0–34.0)
MCHC: 32.6 g/dL (ref 30.0–36.0)
MCV: 102.2 fL — ABNORMAL HIGH (ref 80.0–100.0)
Platelets: 176 10*3/uL (ref 150–400)
RBC: 3.63 MIL/uL — ABNORMAL LOW (ref 4.22–5.81)
RDW: 15.6 % — ABNORMAL HIGH (ref 11.5–15.5)
WBC: 7.3 10*3/uL (ref 4.0–10.5)
nRBC: 0 % (ref 0.0–0.2)

## 2020-06-03 LAB — POCT INR: INR: 2.5 (ref 2.0–3.0)

## 2020-06-03 MED ORDER — METOPROLOL SUCCINATE ER 25 MG PO TB24: 25.0000 mg | ORAL_TABLET | Freq: Every day | ORAL | 6 refills | Status: DC

## 2020-06-03 MED FILL — METOPROLOL SUCCINATE ER 25: 25 | 30 days supply | Qty: 30 | Fill #0

## 2020-06-03 NOTE — Patient Instructions (Signed)
Description   Continue taking Warfarin 1 tablet daily. Recheck INR in 4 weeks. Please call Coumadin Clinic with any changes in medications. [phone].

## 2020-06-03 NOTE — Progress Notes (Signed)
PCP: [name], FNP HF Cardiology: Dr. Aundra Dubin EP: Dr. Lovena Le Nephrology: HD in [place], [place]  Travis Bryan is a 50 y.o. male with history of poorly controlled DM, atrial flutter and fibrillation, nonischemic cardiomyopathy, and ESRD presents for followup of CHF.  Cardiomyopathy has been known for a number of years. Cath in 6/06 showed no obstructive coronary disease.  He had ICD discharge for VT in 10/17 and again in 1/18, both times likely in the setting of CHF exacerbation.    He was admitted in 1/18 with exertional dyspnea/CHF exacerbation.  ICD had also discharged for VT.  He was noted to be in new atrial flutter.  He had a flutter ablation.  However, post-procedure, he developed AKI and cardiogenic shock.  Dobutamine and norepinephrine were required but eventually titrated off.  He developed atrial fibrillation subsequent to the atrial flutter ablation.  He had DCCV to NSR 04/11/16.  He is on amiodarone.   Echo 09/2016 EF 25-30%, moderate dilation, moderate LVH, mild MR, moderately decreased RV systolic function, PASP 51 mmHg.  Echo in 12/19 with EF 25-30%.    He progressed to ESRD in 2/20.   He went back into atrial fibrillation in 7/20 and failed DCCV, but converted to NSR on amiodarone.   He has been doing well recently.  Volume is controlled well by HD.   He is now living in [place], [place] but wants to continue cardiology care in [place].  He gets HD in [place]. No palpitations.  No chest pain or exertional dyspnea.  BP still drops at times with HD though he denies lightheadedness.  BP today is 90/58.   ECG (personally reviewed): NSR, 1st degree AVB, IVCD 134 msec  Labs (2/18): hgb 14.6, K 4.9, creatinine 3.43 Labs 04/26/2016: K 4.1 Creatinine 2.79, TSH normal, LFTs normal, hgb 14.4 Labs (4/18): K 3.7, creatinine 2.61 Labs (6/18): LDL 167, HDL 49, hgb A1c 6 Labs (8/20): Low TSH, normal T3, elevated free T4.  Labs (10/20): LDL 86, HDL 61, elevated TSH, elevated free T4,  normal free T3  PMH: 1. Chronic systolic CHF: Nonischemic cardiomyopathy.  St Jude ICD.  - LHC (6/06): Nonobstructive CAD.  - Echo (1/18): EF 20%, moderate RV dilation with moderate-severely decreased RV systolic function, PASP 48 mmHg.  - Cardiolite (1/18): EF 7%, large fixed inferolateral defect no ischemia.  - Echo (12/19): EF 25-30%.  2. Atrial flutter: s/p ablation 1/18.  3. Atrial fibrillation: Paroxysmal.  S/p DCCV 1/18.  4. ESRD 5. Type II diabetes: Poorly controlled over time.  6. VT: 10/17 and 1/18 with ICD discharge.    Review of systems complete and found to be negative unless listed in HPI.    Social History   Socioeconomic History  . Marital status: Widowed    Spouse name: Not on file  . Number of children: Not on file  . Years of education: Not on file  . Highest education level: Not on file  Occupational History  . Not on file  Tobacco Use  . Smoking status: Never Smoker  . Smokeless tobacco: Never Used  . Tobacco comment: pt denies smoking cigars  Vaping Use  . Vaping Use: Never used  Substance and Sexual Activity  . Alcohol use: No  . Drug use: No  . Sexual activity: Not Currently  Other Topics Concern  . Not on file  Social History Narrative  . Not on file   Social Determinants of Health   Financial Resource Strain: Not on file  Food  Insecurity: Not on file  Transportation Needs: Not on file  Physical Activity: Not on file  Stress: Not on file  Social Connections: Not on file   Family History  Problem Relation Age of Onset  . Hypertension Mother   . Heart disease Mother   . Diabetes Mother    Review of systems complete and found to be negative unless listed in HPI.    Current Outpatient Medications  Medication Sig Dispense Refill  . amiodarone (PACERONE) 200 MG tablet TAKE 1 TABLET BY MOUTH DAILY. 90 tablet 3  . atorvastatin (LIPITOR) 40 MG tablet Take 1 tablet (40 mg total) by mouth daily at 6 PM. 90 tablet 1  . Insulin Syringe-Needle  U-100 31G X 15/64" 0.3 ML MISC Use to inject insulin 4 times a day 200 each 12  . sevelamer carbonate (RENVELA) 800 MG tablet Take 2,400 mg by mouth with breakfast, with lunch, and with evening meal.    . warfarin (COUMADIN) 5 MG tablet Take 1 tablet daily except 1.5 tablets on Sundays and Thursday or as directed by Anticoagulation Clinic. NEEDS APPT FOR FUTURE REFILLS 35 tablet 1  . metoprolol succinate (TOPROL-XL) 25 MG 24 hr tablet Take 1 tablet (25 mg total) by mouth daily. Must keep pending appt for further refill 30 tablet 6   No current facility-administered medications for this encounter.   Vitals:   06/03/20 0839  BP: (!) 90/58  Pulse: 81  SpO2: 94%  Weight: 112.8 kg (248 lb 9.6 oz)   Wt Readings from Last 3 Encounters:  06/03/20 112.8 kg (248 lb 9.6 oz)  09/02/19 106.6 kg (235 lb)  07/10/19 104.2 kg (229 lb 12.8 oz)   Physical Exam General: NAD Neck: No JVD, no thyromegaly or thyroid nodule.  Lungs: Clear to auscultation bilaterally with normal respiratory effort. CV: Nondisplaced PMI.  Heart regular S1/S2, no S3/S4, no murmur.  No peripheral edema.  No carotid bruit.  Normal pedal pulses.  Abdomen: Soft, nontender, no hepatosplenomegaly, no distention.  Skin: Intact without lesions or rashes.  Neurologic: Alert and oriented x 3.  Psych: Normal affect. Extremities: No clubbing or cyanosis.  HEENT: Normal.   Assessment/Plan: 1. Chronic systolic CHF: Nonischemic cardiomyopathy.  St Jude ICD.  Echo 1/18 with EF 20% and moderate to severely decreased RV systolic function.  Echo 6/18 with EF 25-30%, moderate dilation, moderate LVH, mild MR, moderately decreased RV systolic function, PASP 51 mmHg. Echo in 12/19 with EF 25-30%.  At this point, volume is well-controlled at HD.  NYHA class II symptoms. He will not tolerate additional cardiac meds due to low BP with HD.   - Continue Toprol XL 25 mg daily, will not increase with soft BP and lightheadedness after HD.   - He has IVCD  134 msec without true LBBB, unlikely to benefit from CRT (also increased infection risk with procedure due to ESRD).  - I will arrange for echo.  2. Atrial fibrillation: He is in NSR today on amiodarone.  - Continue amiodarone 200 mg daily.  Check LFTs, TSH, free T3, and free T4 today.  He will need a regular eye exam.   - Continue warfarin for anticoagulation.  3. Diabetes: Per PCP.  - check lipids today.  4. VT: Now on amiodarone.  5. ESRD: Per nephrology.   Followup in 6 months.   Loralie Champagne, MD  06/03/2020

## 2020-06-03 NOTE — Patient Instructions (Signed)
Labs done today, we will call you for abnormal results  Your physician has requested that you have an echocardiogram. Echocardiography is a painless test that uses sound waves to create images of your heart. It provides your doctor with information about the size and shape of your heart and how well your heart's chambers and valves are working. This procedure takes approximately one hour. There are no restrictions for this procedure. THIS WILL BE DONE AT [facility], THEY WILL CALL YOU TO SCHEDULE, WE ASK THEY SCHEDULE IT THE SAME DAY AS YOUR COUMADIN APPOINTMENT  Please call our office in August to schedule your follow up appointment  If you have any questions or concerns before your next appointment please send [place] a message through [place] or call our office at [phone].    TO LEAVE A MESSAGE FOR THE NURSE SELECT OPTION 2, PLEASE LEAVE A MESSAGE INCLUDING: . YOUR NAME . DATE OF BIRTH . CALL BACK NUMBER . REASON FOR CALL**this is important as we prioritize the call backs  [place] AS LONG AS YOU CALL BEFORE 4:00 PM  At the [facility], you and your health needs are our priority. As part of our continuing mission to provide you with exceptional heart care, we have created designated Provider Care Teams. These Care Teams include your primary Cardiologist (physician) and Advanced Practice Providers (APPs- Physician Assistants and Nurse Practitioners) who all work together to provide you with the care you need, when you need it.   You may see any of the following providers on your designated Care Team at your next follow up: Marland Kitchen Dr Glori Bickers . Dr Loralie Champagne . Dr Vickki Muff . Darrick Grinder, NP . Lyda Jester, [place] . Audry Riles, PharmD   Please be sure to bring in all your medications bottles to every appointment.

## 2020-06-04 LAB — T3, FREE: T3, Free: 3 pg/mL (ref 2.0–4.4)

## 2020-06-14 ENCOUNTER — Other Ambulatory Visit: Payer: Self-pay | Admitting: Internal Medicine

## 2020-06-14 ENCOUNTER — Other Ambulatory Visit: Payer: Self-pay

## 2020-06-14 DIAGNOSIS — E1169 Type 2 diabetes mellitus with other specified complication: Secondary | ICD-10-CM

## 2020-06-14 DIAGNOSIS — E785 Hyperlipidemia, unspecified: Secondary | ICD-10-CM

## 2020-06-14 MED ORDER — ATORVASTATIN CALCIUM 40 MG PO TABS
40.0000 mg | ORAL_TABLET | Freq: Every day | ORAL | 0 refills | Status: DC
  Filled 2020-06-14: qty 30, 30d supply, fill #0

## 2020-06-14 NOTE — Progress Notes (Signed)
Atorvastatin refilled 30 days future refills need appt

## 2020-06-15 ENCOUNTER — Other Ambulatory Visit: Payer: Self-pay

## 2020-06-17 ENCOUNTER — Other Ambulatory Visit: Payer: Self-pay

## 2020-06-18 ENCOUNTER — Other Ambulatory Visit: Payer: Self-pay

## 2020-06-18 ENCOUNTER — Other Ambulatory Visit: Payer: Self-pay | Admitting: Internal Medicine

## 2020-06-18 ENCOUNTER — Other Ambulatory Visit: Payer: Self-pay | Admitting: *Deleted

## 2020-06-18 MED ORDER — WARFARIN SODIUM 5 MG PO TABS: ORAL_TABLET | ORAL | 0 refills | Status: DC

## 2020-06-18 NOTE — Telephone Encounter (Signed)
Prescription refill request received for warfarin Lov: 06/03/2020 Next INR check: 4/21 Warfarin tablet strength:'5mg'$ 

## 2020-06-18 NOTE — Telephone Encounter (Addendum)
Prescription refill request received for warfarin Lov: 06/03/20, [facility] Next INR check: 4/21 Warfarin tablet strength: '5mg'$ 

## 2020-06-22 ENCOUNTER — Other Ambulatory Visit: Payer: Self-pay

## 2020-06-29 ENCOUNTER — Other Ambulatory Visit: Payer: Self-pay

## 2020-07-01 ENCOUNTER — Ambulatory Visit (INDEPENDENT_AMBULATORY_CARE_PROVIDER_SITE_OTHER): Payer: Medicare Other | Admitting: *Deleted

## 2020-07-01 ENCOUNTER — Other Ambulatory Visit: Payer: Self-pay

## 2020-07-01 ENCOUNTER — Ambulatory Visit (HOSPITAL_COMMUNITY): Payer: Medicare Other | Attending: Cardiology

## 2020-07-01 DIAGNOSIS — Z95810 Presence of automatic (implantable) cardiac defibrillator: Secondary | ICD-10-CM | POA: Diagnosis not present

## 2020-07-01 DIAGNOSIS — Z5181 Encounter for therapeutic drug level monitoring: Secondary | ICD-10-CM

## 2020-07-01 DIAGNOSIS — I483 Typical atrial flutter: Secondary | ICD-10-CM | POA: Diagnosis not present

## 2020-07-01 DIAGNOSIS — I4819 Other persistent atrial fibrillation: Secondary | ICD-10-CM | POA: Diagnosis not present

## 2020-07-01 DIAGNOSIS — I5020 Unspecified systolic (congestive) heart failure: Secondary | ICD-10-CM | POA: Insufficient documentation

## 2020-07-01 LAB — ECHOCARDIOGRAM COMPLETE
Area-P 1/2: 2.91 cm2
S' Lateral: 4.6 cm

## 2020-07-01 LAB — POCT INR: INR: 3.6 — AB (ref 2.0–3.0)

## 2020-07-01 MED ORDER — PERFLUTREN LIPID MICROSPHERE
1.0000 mL | INTRAVENOUS | Status: AC | PRN
  Administered 2020-07-01: 1 mL via INTRAVENOUS

## 2020-07-01 MED FILL — Metoprolol Succinate Tab ER 24HR 25 MG (Tartrate Equiv): ORAL | 30 days supply | Qty: 30 | Fill #0 | Status: AC

## 2020-07-01 NOTE — Patient Instructions (Addendum)
Description   Do not take any Warfarin today then continue taking Warfarin 1 tablet daily. Recheck INR in 3 weeks. Please call Coumadin Clinic with any changes in medications. [phone].

## 2020-07-12 ENCOUNTER — Other Ambulatory Visit: Payer: Self-pay

## 2020-07-12 ENCOUNTER — Other Ambulatory Visit (HOSPITAL_COMMUNITY): Payer: Self-pay | Admitting: Cardiology

## 2020-07-15 ENCOUNTER — Other Ambulatory Visit: Payer: Self-pay

## 2020-07-15 MED ORDER — AMIODARONE HCL 200 MG PO TABS
ORAL_TABLET | Freq: Every day | ORAL | 3 refills | Status: DC
Start: 2020-07-15 — End: 2021-07-18
  Filled 2020-07-15: qty 90, 90d supply, fill #0
  Filled 2020-10-14: qty 90, 90d supply, fill #1
  Filled 2021-01-17: qty 90, 90d supply, fill #2
  Filled 2021-04-18: qty 90, 90d supply, fill #0

## 2020-07-20 ENCOUNTER — Other Ambulatory Visit: Payer: Self-pay

## 2020-07-22 ENCOUNTER — Other Ambulatory Visit: Payer: Self-pay

## 2020-07-22 ENCOUNTER — Ambulatory Visit (INDEPENDENT_AMBULATORY_CARE_PROVIDER_SITE_OTHER): Payer: Medicare Other | Admitting: *Deleted

## 2020-07-22 DIAGNOSIS — Z5181 Encounter for therapeutic drug level monitoring: Secondary | ICD-10-CM | POA: Diagnosis not present

## 2020-07-22 DIAGNOSIS — Z95810 Presence of automatic (implantable) cardiac defibrillator: Secondary | ICD-10-CM

## 2020-07-22 DIAGNOSIS — E785 Hyperlipidemia, unspecified: Secondary | ICD-10-CM

## 2020-07-22 DIAGNOSIS — I4819 Other persistent atrial fibrillation: Secondary | ICD-10-CM

## 2020-07-22 DIAGNOSIS — I483 Typical atrial flutter: Secondary | ICD-10-CM | POA: Diagnosis not present

## 2020-07-22 DIAGNOSIS — E1169 Type 2 diabetes mellitus with other specified complication: Secondary | ICD-10-CM

## 2020-07-22 LAB — POCT INR: INR: 2.3 (ref 2.0–3.0)

## 2020-07-22 NOTE — Patient Instructions (Signed)
Description   Continue taking Warfarin 1 tablet daily. Recheck INR in 4 weeks. Please call Coumadin Clinic with any changes in medications. [phone].

## 2020-07-23 MED ORDER — ATORVASTATIN CALCIUM 40 MG PO TABS: ORAL_TABLET | Freq: Every day | ORAL | 1 refills | Status: DC

## 2020-07-23 NOTE — Addendum Note (Signed)
Addended by: Rollen Sox on: 07/23/2020 12:05 PM   Modules accepted: Orders

## 2020-08-03 ENCOUNTER — Other Ambulatory Visit: Payer: Self-pay

## 2020-08-03 ENCOUNTER — Other Ambulatory Visit (HOSPITAL_COMMUNITY): Payer: Self-pay | Admitting: Cardiology

## 2020-08-03 MED ORDER — METOPROLOL SUCCINATE ER 25 MG PO TB24
25.0000 mg | ORAL_TABLET | Freq: Every day | ORAL | 6 refills | Status: DC
  Filled 2020-08-03: qty 30, 30d supply, fill #0
  Filled 2020-09-01: qty 30, 30d supply, fill #1
  Filled 2020-10-04: qty 30, 30d supply, fill #2
  Filled 2020-11-04: qty 30, 30d supply, fill #3
  Filled 2020-12-03: qty 30, 30d supply, fill #4
  Filled 2021-01-03: qty 30, 30d supply, fill #5
  Filled 2021-01-31: qty 30, 30d supply, fill #6

## 2020-08-05 ENCOUNTER — Other Ambulatory Visit: Payer: Self-pay

## 2020-08-10 ENCOUNTER — Other Ambulatory Visit: Payer: Self-pay

## 2020-08-12 ENCOUNTER — Ambulatory Visit (INDEPENDENT_AMBULATORY_CARE_PROVIDER_SITE_OTHER): Payer: Medicare Other

## 2020-08-12 DIAGNOSIS — I428 Other cardiomyopathies: Secondary | ICD-10-CM

## 2020-08-12 LAB — CUP PACEART REMOTE DEVICE CHECK
Battery Remaining Longevity: 47 mo
Battery Remaining Percentage: 44 %
Battery Voltage: 2.89 V
Brady Statistic RV Percent Paced: 1 %
Date Time Interrogation Session: 20220602020017
HighPow Impedance: 68 Ohm
HighPow Impedance: 68 Ohm
Implantable Lead Implant Date: 20090211
Implantable Lead Location: 753860
Implantable Lead Model: 7121
Implantable Pulse Generator Implant Date: 20150617
Lead Channel Impedance Value: 440 Ohm
Lead Channel Pacing Threshold Amplitude: 1 V
Lead Channel Pacing Threshold Pulse Width: 0.5 ms
Lead Channel Sensing Intrinsic Amplitude: 11.6 mV
Lead Channel Setting Pacing Amplitude: 2.5 V
Lead Channel Setting Pacing Pulse Width: 0.5 ms
Lead Channel Setting Sensing Sensitivity: 0.5 mV
Pulse Gen Serial Number: 7200640

## 2020-08-19 ENCOUNTER — Other Ambulatory Visit: Payer: Self-pay

## 2020-08-19 ENCOUNTER — Ambulatory Visit (INDEPENDENT_AMBULATORY_CARE_PROVIDER_SITE_OTHER): Payer: Medicare Other | Admitting: *Deleted

## 2020-08-19 DIAGNOSIS — I4819 Other persistent atrial fibrillation: Secondary | ICD-10-CM | POA: Diagnosis not present

## 2020-08-19 DIAGNOSIS — Z95810 Presence of automatic (implantable) cardiac defibrillator: Secondary | ICD-10-CM

## 2020-08-19 DIAGNOSIS — Z5181 Encounter for therapeutic drug level monitoring: Secondary | ICD-10-CM | POA: Diagnosis not present

## 2020-08-19 DIAGNOSIS — I483 Typical atrial flutter: Secondary | ICD-10-CM

## 2020-08-19 LAB — POCT INR: INR: 3 (ref 2.0–3.0)

## 2020-08-19 NOTE — Patient Instructions (Signed)
Description   Continue taking Warfarin 1 tablet daily. Recheck INR in 5 weeks. Please call Coumadin Clinic with any changes in medications. [phone].

## 2020-09-01 ENCOUNTER — Other Ambulatory Visit: Payer: Self-pay | Admitting: Cardiology

## 2020-09-01 ENCOUNTER — Other Ambulatory Visit: Payer: Self-pay

## 2020-09-01 DIAGNOSIS — E1169 Type 2 diabetes mellitus with other specified complication: Secondary | ICD-10-CM

## 2020-09-01 DIAGNOSIS — E785 Hyperlipidemia, unspecified: Secondary | ICD-10-CM

## 2020-09-01 MED ORDER — ATORVASTATIN CALCIUM 40 MG PO TABS
ORAL_TABLET | Freq: Every day | ORAL | 3 refills | Status: DC
  Filled 2020-09-01: qty 90, 90d supply, fill #0
  Filled 2020-11-29: qty 90, 90d supply, fill #1
  Filled 2021-02-22: qty 90, 90d supply, fill #2
  Filled 2021-05-30: qty 90, 90d supply, fill #0

## 2020-09-03 ENCOUNTER — Other Ambulatory Visit: Payer: Self-pay

## 2020-09-06 NOTE — Progress Notes (Signed)
Remote ICD transmission.   

## 2020-09-23 ENCOUNTER — Other Ambulatory Visit: Payer: Self-pay

## 2020-09-23 ENCOUNTER — Ambulatory Visit (INDEPENDENT_AMBULATORY_CARE_PROVIDER_SITE_OTHER): Payer: Medicare Other | Admitting: *Deleted

## 2020-09-23 DIAGNOSIS — I4819 Other persistent atrial fibrillation: Secondary | ICD-10-CM | POA: Diagnosis not present

## 2020-09-23 DIAGNOSIS — I483 Typical atrial flutter: Secondary | ICD-10-CM

## 2020-09-23 DIAGNOSIS — Z5181 Encounter for therapeutic drug level monitoring: Secondary | ICD-10-CM

## 2020-09-23 DIAGNOSIS — Z95810 Presence of automatic (implantable) cardiac defibrillator: Secondary | ICD-10-CM | POA: Diagnosis not present

## 2020-09-23 LAB — POCT INR: INR: 4.5 — AB (ref 2.0–3.0)

## 2020-09-23 MED ORDER — WARFARIN SODIUM 5 MG PO TABS: ORAL_TABLET | ORAL | 2 refills | Status: DC

## 2020-09-23 NOTE — Patient Instructions (Signed)
Description   Do not take any Warfarin today and No Warfarin tomorrow then continue taking Warfarin 1 tablet daily. Recheck INR in 3 weeks. Please call Coumadin Clinic with any changes in medications. [phone].

## 2020-10-04 ENCOUNTER — Other Ambulatory Visit: Payer: Self-pay

## 2020-10-14 ENCOUNTER — Ambulatory Visit (INDEPENDENT_AMBULATORY_CARE_PROVIDER_SITE_OTHER): Payer: Medicare Other

## 2020-10-14 ENCOUNTER — Other Ambulatory Visit: Payer: Self-pay

## 2020-10-14 DIAGNOSIS — I483 Typical atrial flutter: Secondary | ICD-10-CM | POA: Diagnosis not present

## 2020-10-14 DIAGNOSIS — I4819 Other persistent atrial fibrillation: Secondary | ICD-10-CM | POA: Diagnosis not present

## 2020-10-14 DIAGNOSIS — Z95810 Presence of automatic (implantable) cardiac defibrillator: Secondary | ICD-10-CM | POA: Diagnosis not present

## 2020-10-14 DIAGNOSIS — Z5181 Encounter for therapeutic drug level monitoring: Secondary | ICD-10-CM

## 2020-10-14 LAB — POCT INR: INR: 3.5 — AB (ref 2.0–3.0)

## 2020-10-14 NOTE — Patient Instructions (Signed)
Description   Do not take any Warfarin today and start taking Warfarin 1 tablet daily except 1/2 tablet on Wednesdays. Recheck INR in 3 weeks. Please call Coumadin Clinic with any changes in medications. [phone].

## 2020-10-15 ENCOUNTER — Other Ambulatory Visit: Payer: Self-pay

## 2020-10-18 ENCOUNTER — Other Ambulatory Visit: Payer: Self-pay

## 2020-10-19 ENCOUNTER — Other Ambulatory Visit: Payer: Self-pay

## 2020-11-04 ENCOUNTER — Other Ambulatory Visit: Payer: Self-pay

## 2020-11-08 ENCOUNTER — Other Ambulatory Visit: Payer: Self-pay

## 2020-11-09 ENCOUNTER — Other Ambulatory Visit: Payer: Self-pay

## 2020-11-09 ENCOUNTER — Ambulatory Visit (INDEPENDENT_AMBULATORY_CARE_PROVIDER_SITE_OTHER): Payer: Medicare Other | Admitting: *Deleted

## 2020-11-09 DIAGNOSIS — I4819 Other persistent atrial fibrillation: Secondary | ICD-10-CM

## 2020-11-09 DIAGNOSIS — Z5181 Encounter for therapeutic drug level monitoring: Secondary | ICD-10-CM | POA: Diagnosis not present

## 2020-11-09 DIAGNOSIS — Z95810 Presence of automatic (implantable) cardiac defibrillator: Secondary | ICD-10-CM

## 2020-11-09 DIAGNOSIS — I483 Typical atrial flutter: Secondary | ICD-10-CM

## 2020-11-09 LAB — POCT INR: INR: 2.7 (ref 2.0–3.0)

## 2020-11-09 NOTE — Patient Instructions (Signed)
Description   Continue taking Warfarin 1 tablet daily except 1/2 tablet on Wednesdays. Recheck INR in 4 weeks. Please call Coumadin Clinic with any changes in medications. [phone].

## 2020-11-11 ENCOUNTER — Ambulatory Visit (INDEPENDENT_AMBULATORY_CARE_PROVIDER_SITE_OTHER): Payer: Medicare Other

## 2020-11-11 DIAGNOSIS — I428 Other cardiomyopathies: Secondary | ICD-10-CM

## 2020-11-16 LAB — CUP PACEART REMOTE DEVICE CHECK
Battery Remaining Longevity: 44 mo
Battery Remaining Percentage: 41 %
Battery Voltage: 2.87 V
Brady Statistic RV Percent Paced: 1 %
Date Time Interrogation Session: 20220901020018
HighPow Impedance: 66 Ohm
HighPow Impedance: 67 Ohm
Implantable Lead Implant Date: 20090211
Implantable Lead Location: 753860
Implantable Lead Model: 7121
Implantable Pulse Generator Implant Date: 20150617
Lead Channel Impedance Value: 480 Ohm
Lead Channel Pacing Threshold Amplitude: 1 V
Lead Channel Pacing Threshold Pulse Width: 0.5 ms
Lead Channel Sensing Intrinsic Amplitude: 10.2 mV
Lead Channel Setting Pacing Amplitude: 2.5 V
Lead Channel Setting Pacing Pulse Width: 0.5 ms
Lead Channel Setting Sensing Sensitivity: 0.5 mV
Pulse Gen Serial Number: 7200640

## 2020-11-23 NOTE — Progress Notes (Signed)
Remote ICD transmission.   

## 2020-11-29 ENCOUNTER — Other Ambulatory Visit: Payer: Self-pay

## 2020-12-03 ENCOUNTER — Other Ambulatory Visit: Payer: Self-pay

## 2020-12-07 ENCOUNTER — Other Ambulatory Visit: Payer: Self-pay

## 2020-12-07 ENCOUNTER — Ambulatory Visit (INDEPENDENT_AMBULATORY_CARE_PROVIDER_SITE_OTHER): Payer: Medicare Other | Admitting: *Deleted

## 2020-12-07 ENCOUNTER — Telehealth (HOSPITAL_COMMUNITY): Payer: Self-pay | Admitting: Cardiology

## 2020-12-07 DIAGNOSIS — I483 Typical atrial flutter: Secondary | ICD-10-CM | POA: Diagnosis not present

## 2020-12-07 DIAGNOSIS — Z95810 Presence of automatic (implantable) cardiac defibrillator: Secondary | ICD-10-CM

## 2020-12-07 DIAGNOSIS — I4819 Other persistent atrial fibrillation: Secondary | ICD-10-CM

## 2020-12-07 DIAGNOSIS — Z5181 Encounter for therapeutic drug level monitoring: Secondary | ICD-10-CM | POA: Diagnosis not present

## 2020-12-07 LAB — POCT INR: INR: 3.2 — AB (ref 2.0–3.0)

## 2020-12-07 NOTE — Patient Instructions (Signed)
Description   Today take 1/2 tablet then continue taking Warfarin 1 tablet daily except 1/2 tablet on Wednesdays. Recheck INR in 4 weeks. Please call Coumadin Clinic with any changes in medications. [phone].

## 2020-12-22 ENCOUNTER — Encounter (HOSPITAL_COMMUNITY): Payer: Medicare Other | Admitting: Cardiology

## 2020-12-28 ENCOUNTER — Other Ambulatory Visit: Payer: Self-pay | Admitting: Cardiology

## 2020-12-28 DIAGNOSIS — I4819 Other persistent atrial fibrillation: Secondary | ICD-10-CM

## 2020-12-28 DIAGNOSIS — I483 Typical atrial flutter: Secondary | ICD-10-CM

## 2020-12-28 DIAGNOSIS — Z95810 Presence of automatic (implantable) cardiac defibrillator: Secondary | ICD-10-CM

## 2020-12-28 DIAGNOSIS — Z5181 Encounter for therapeutic drug level monitoring: Secondary | ICD-10-CM

## 2020-12-29 ENCOUNTER — Encounter (HOSPITAL_COMMUNITY): Payer: Medicare Other | Admitting: Cardiology

## 2021-01-03 ENCOUNTER — Other Ambulatory Visit: Payer: Self-pay

## 2021-01-04 ENCOUNTER — Other Ambulatory Visit: Payer: Self-pay

## 2021-01-11 ENCOUNTER — Other Ambulatory Visit: Payer: Self-pay

## 2021-01-11 ENCOUNTER — Ambulatory Visit (INDEPENDENT_AMBULATORY_CARE_PROVIDER_SITE_OTHER): Payer: Medicare Other | Admitting: *Deleted

## 2021-01-11 DIAGNOSIS — Z95810 Presence of automatic (implantable) cardiac defibrillator: Secondary | ICD-10-CM

## 2021-01-11 DIAGNOSIS — I4819 Other persistent atrial fibrillation: Secondary | ICD-10-CM | POA: Diagnosis not present

## 2021-01-11 DIAGNOSIS — Z5181 Encounter for therapeutic drug level monitoring: Secondary | ICD-10-CM

## 2021-01-11 DIAGNOSIS — I483 Typical atrial flutter: Secondary | ICD-10-CM | POA: Diagnosis not present

## 2021-01-11 LAB — POCT INR: INR: 2.8 (ref 2.0–3.0)

## 2021-01-11 MED ORDER — WARFARIN SODIUM 5 MG PO TABS
ORAL_TABLET | ORAL | 1 refills | Status: DC
Start: 2021-01-11 — End: 2021-02-15

## 2021-01-11 NOTE — Patient Instructions (Signed)
Description   Continue taking Warfarin 1 tablet daily except 1/2 tablet on Wednesdays. Recheck INR in 5 weeks. Please call Coumadin Clinic with any changes in medications. [phone].

## 2021-01-17 ENCOUNTER — Other Ambulatory Visit: Payer: Self-pay

## 2021-01-31 ENCOUNTER — Other Ambulatory Visit: Payer: Self-pay

## 2021-02-10 ENCOUNTER — Ambulatory Visit (INDEPENDENT_AMBULATORY_CARE_PROVIDER_SITE_OTHER): Payer: Medicare Other

## 2021-02-10 DIAGNOSIS — I428 Other cardiomyopathies: Secondary | ICD-10-CM | POA: Diagnosis not present

## 2021-02-10 LAB — CUP PACEART REMOTE DEVICE CHECK
Battery Remaining Longevity: 43 mo
Battery Remaining Percentage: 40 %
Battery Voltage: 2.87 V
Brady Statistic RV Percent Paced: 1 %
Date Time Interrogation Session: 20221201020020
HighPow Impedance: 62 Ohm
HighPow Impedance: 63 Ohm
Implantable Lead Implant Date: 20090211
Implantable Lead Location: 753860
Implantable Lead Model: 7121
Implantable Pulse Generator Implant Date: 20150617
Lead Channel Impedance Value: 450 Ohm
Lead Channel Pacing Threshold Amplitude: 1 V
Lead Channel Pacing Threshold Pulse Width: 0.5 ms
Lead Channel Sensing Intrinsic Amplitude: 8.8 mV
Lead Channel Setting Pacing Amplitude: 2.5 V
Lead Channel Setting Pacing Pulse Width: 0.5 ms
Lead Channel Setting Sensing Sensitivity: 0.5 mV
Pulse Gen Serial Number: 7200640

## 2021-02-15 ENCOUNTER — Ambulatory Visit (INDEPENDENT_AMBULATORY_CARE_PROVIDER_SITE_OTHER): Payer: Medicare Other

## 2021-02-15 ENCOUNTER — Ambulatory Visit (INDEPENDENT_AMBULATORY_CARE_PROVIDER_SITE_OTHER): Payer: Medicare Other | Admitting: Student

## 2021-02-15 ENCOUNTER — Other Ambulatory Visit: Payer: Self-pay

## 2021-02-15 ENCOUNTER — Encounter: Payer: Self-pay | Admitting: Student

## 2021-02-15 VITALS — BP 92/60 | HR 73 | Ht 72.0 in | Wt 249.6 lb

## 2021-02-15 DIAGNOSIS — I428 Other cardiomyopathies: Secondary | ICD-10-CM | POA: Diagnosis not present

## 2021-02-15 DIAGNOSIS — N186 End stage renal disease: Secondary | ICD-10-CM | POA: Diagnosis not present

## 2021-02-15 DIAGNOSIS — I4819 Other persistent atrial fibrillation: Secondary | ICD-10-CM

## 2021-02-15 DIAGNOSIS — I483 Typical atrial flutter: Secondary | ICD-10-CM

## 2021-02-15 DIAGNOSIS — Z5181 Encounter for therapeutic drug level monitoring: Secondary | ICD-10-CM

## 2021-02-15 DIAGNOSIS — I5022 Chronic systolic (congestive) heart failure: Secondary | ICD-10-CM | POA: Diagnosis not present

## 2021-02-15 DIAGNOSIS — Z95810 Presence of automatic (implantable) cardiac defibrillator: Secondary | ICD-10-CM

## 2021-02-15 LAB — CUP PACEART INCLINIC DEVICE CHECK
Battery Remaining Longevity: 43 mo
Brady Statistic RV Percent Paced: 0.03 %
Date Time Interrogation Session: 20221206094808
HighPow Impedance: 66.5806
Implantable Lead Implant Date: 20090211
Implantable Lead Location: 753860
Implantable Lead Model: 7121
Implantable Pulse Generator Implant Date: 20150617
Lead Channel Impedance Value: 475 Ohm
Lead Channel Pacing Threshold Amplitude: 1.25 V
Lead Channel Pacing Threshold Amplitude: 1.25 V
Lead Channel Pacing Threshold Pulse Width: 0.7 ms
Lead Channel Pacing Threshold Pulse Width: 0.7 ms
Lead Channel Sensing Intrinsic Amplitude: 11.4 mV
Lead Channel Setting Pacing Amplitude: 2.5 V
Lead Channel Setting Pacing Pulse Width: 0.7 ms
Lead Channel Setting Sensing Sensitivity: 0.5 mV
Pulse Gen Serial Number: 7200640

## 2021-02-15 LAB — POCT INR: INR: 2.5 (ref 2.0–3.0)

## 2021-02-15 MED ORDER — WARFARIN SODIUM 5 MG PO TABS: ORAL_TABLET | ORAL | 1 refills | Status: DC

## 2021-02-15 NOTE — Progress Notes (Signed)
Electrophysiology Office Note Date: 02/15/2021  ID:  Travis Bryan, DOB 07-Jun-1970, MRN [id number]  PCP: [name], FNP Primary Cardiologist: Loralie Champagne, MD Electrophysiologist: Cristopher Peru, MD   CC: Routine ICD follow-up  Travis Bryan is a 50 y.o. male seen today for Cristopher Peru, MD for routine electrophysiology followup.  Since last being seen in our clinic the patient reports doing OK, though it has been a long abcence. He lives in [place], [place]. Prefers to travel back to see Dr. Lovena Le and Dr. Aundra Dubin. .  he denies chest pain, palpitations, dyspnea, PND, orthopnea, nausea, vomiting, dizziness, syncope, edema, weight gain, or early satiety. He has not had ICD shocks.   Device History: St. Jude Single Chamber ICD implanted 2015 for CHF  Past Medical History:  Diagnosis Date   AICD (automatic cardioverter/defibrillator) present    Atrial flutter ([place])    a. s/p ablation 03/2016   Chronic kidney disease    Chronic systolic CHF (congestive heart failure) ([place])    HCAP (healthcare-associated pneumonia) 03/28/2018   History of gout    Hyperlipidemia    Hypertension    Myocardial infarction ([place])    "I think I had one a long long time ago" (03/28/2016)   NICM (nonischemic cardiomyopathy) ([place])    a. LHC 6/06: pLAD 20, pLCx 20-30; b. Echo 5/15:  EF 15%, diffuse HK, restrictive physiology, trivial AI, trivial MR, mild LAE, moderate RVE, moderately reduced RVSF, moderate RAE, mild to moderate TR, PASP 43 mmHg   Obesity    Persistent atrial fibrillation (H[facility])    Stroke (H[facility])    Type II diabetes mellitus ([place])    Past Surgical History:  Procedure Laterality Date   AV FISTULA PLACEMENT Left 04/25/2018   Procedure: ARTERIOVENOUS (AV) VS GRAFT ARM;  Surgeon: Serafina Mitchell, MD;  Location: [facility];  Service: Vascular;  Laterality: Left;   [place] Left 07/19/2018   Procedure: SECOND STAGE BASILIC VEIN TRANSPOSITION LEFT ARM;  Surgeon: Serafina Mitchell, MD;   Location: [facility];  Service: Vascular;  Laterality: Left;   CARDIOVERSION N/A 04/07/2016   Procedure: CARDIOVERSION;  Surgeon: Thayer Headings, MD;  Location[facility] ENDOSCOPY;  Service: Cardiovascular;  Laterality: N/A;   CARDIOVERSION N/A 04/11/2016   Procedure: CARDIOVERSION;  Surgeon: Larey Dresser, MD;  Location: [facility];  Service: Cardiovascular;  Laterality: N/A;   CARDIOVERSION N/A 09/25/2018   Procedure: CARDIOVERSION;  Surgeon: Buford Dresser, MD;  Location: [facility];  Service: Cardiovascular;  Laterality: N/A;   COLONOSCOPY WITH PROPOFOL N/A 09/10/2018   Procedure: COLONOSCOPY WITH PROPOFOL;  Surgeon: Otis Brace, MD;  Location: [facility];  Service: Gastroenterology;  Laterality: N/A;   ELECTROPHYSIOLOGIC STUDY N/A 03/31/2016   Procedure: A-Flutter Ablation;  Surgeon: Evans Lance, MD;  Location: [facility] CV LAB;  Service: Cardiovascular;  Laterality: N/A;   ESOPHAGOGASTRODUODENOSCOPY (EGD) WITH PROPOFOL N/A 09/10/2018   Procedure: ESOPHAGOGASTRODUODENOSCOPY (EGD) WITH PROPOFOL;  Surgeon: Otis Brace, MD;  Location: [facility];  Service: Gastroenterology;  Laterality: N/A;   EXCHANGE OF A DIALYSIS CATHETER Left 04/25/2018   Procedure: Attempted Exchange Of A Dialysis Catheter on the Left side;  Surgeon: Serafina Mitchell, MD;  Location: [facility];  Service: Vascular;  Laterality: Left;   IMPLANTABLE CARDIOVERTER DEFIBRILLATOR (ICD) GENERATOR CHANGE N/A 08/27/2013   Procedure: ICD GENERATOR CHANGE;  Surgeon: Evans Lance, MD;  [facility] C[facility] LAB;  Service: Cardiovascular;  Laterality: N/A;   INSERTION OF DIALYSIS CATHETER Right 04/25/2018   Procedure: INSERTION OF  TUNNELED DIALYSIS CATHETER;  Surgeon: Serafina Mitchell, MD;  Location[facility] OR;  Service: Vascular;  Laterality: Right;   POLYPECTOMY  09/10/2018   Procedure: POLYPECTOMY;  Surgeon: Otis Brace, MD;  Location: [facility] ENDOSCOPY;  Service: Gastroenterology;;   TEE WITHOUT CARDIOVERSION N/A 03/31/2016    Procedure: TRANSESOPHAGEAL ECHOCARDIOGRAM (TEE);  Surgeon: Larey Dresser, MD;  Location: [facility] ENDOSCOPY;  Service: Cardiovascular;  Laterality: N/A;    Current Outpatient Medications  Medication Sig Dispense Refill   amiodarone (PACERONE) 200 MG tablet TAKE 1 TABLET BY MOUTH DAILY. 90 tablet 3   atorvastatin (LIPITOR) 40 MG tablet TAKE 1 TABLET (40 MG TOTAL) BY MOUTH DAILY AT 6 PM. 90 tablet 3   metoprolol succinate (TOPROL-XL) 25 MG 24 hr tablet Take 1 tablet (25 mg total) by mouth daily. Must keep pending appt for further refill 30 tablet 6   sevelamer carbonate (RENVELA) 800 MG tablet Take 2,400 mg by mouth with breakfast, with lunch, and with evening meal.     warfarin (COUMADIN) 5 MG tablet Take 1/2 to 1 tablet once daily as directed by Coumadin Clinic 35 tablet 1   Insulin Syringe-Needle U-100 31G X 15/64" 0.3 ML MISC Use to inject insulin 4 times a day (Patient not taking: Reported on 02/15/2021) 200 each 12   No current facility-administered medications for this visit.    Allergies:   Patient has no known allergies.   Social History: Social History   Socioeconomic History   Marital status: Widowed    Spouse name: Not on file   Number of children: Not on file   Years of education: Not on file   Highest education level: Not on file  Occupational History   Not on file  Tobacco Use   Smoking status: Never   Smokeless tobacco: Never   Tobacco comments:    pt denies smoking cigars  Vaping Use   Vaping Use: Never used  Substance and Sexual Activity   Alcohol use: No   Drug use: No   Sexual activity: Not Currently  Other Topics Concern   Not on file  Social History Narrative   Not on file   Social Determinants of Health   Financial Resource Strain: Not on file  Food Insecurity: Not on file  Transportation Needs: Not on file  Physical Activity: Not on file  Stress: Not on file  Social Connections: Not on file  Intimate Partner Violence: Not on file    Family  History: Family History  Problem Relation Age of Onset   Hypertension Mother    Heart disease Mother    Diabetes Mother     Review of Systems: All other systems reviewed and are otherwise negative except as noted above.   Physical Exam: Vitals:   02/15/21 0909  BP: 92/60  Pulse: 73  SpO2: 90%  Weight: 249 lb 9.6 oz (113.2 kg)  Height: 6' (1.829 m)     GEN- The patient is well appearing, alert and oriented x 3 today.   HEENT: normocephalic, atraumatic; sclera clear, conjunctiva pink; hearing intact; oropharynx clear; neck supple, no JVP Lymph- no cervical lymphadenopathy Lungs- Clear to ausculation bilaterally, normal work of breathing.  No wheezes, rales, rhonchi Heart- Regular rate and rhythm, no murmurs, rubs or gallops, PMI not laterally displaced GI- soft, non-tender, non-distended, bowel sounds present, no hepatosplenomegaly Extremities- no clubbing or cyanosis. No edema; DP/PT/radial pulses 2+ bilaterally MS- no significant deformity or atrophy Skin- warm and dry, no rash or lesion; ICD pocket well healed  Psych- euthymic mood, full affect Neuro- strength and sensation are intact  ICD interrogation- reviewed in detail today,  See PACEART report  EKG:  EKG is not ordered today.  Recent Labs: 06/03/2020: ALT 25; BUN 40; Creatinine, Ser 14.02; Hemoglobin 12.1; Platelets 176; Potassium 4.9; Sodium 138; TSH 5.116   Wt Readings from Last 3 Encounters:  02/15/21 249 lb 9.6 oz (113.2 kg)  06/03/20 248 lb 9.6 oz (112.8 kg)  09/02/19 235 lb (106.6 kg)     Other studies Reviewed: Additional studies/ records that were reviewed today include: Previous EP office notes.   Assessment and Plan:  1.  Chronic systolic dysfunction s/p St. Jude single chamber ICD  euvolemic today Stable on an appropriate medical regimen Normal ICD function See Pace Art report No changes today  2.  VF/VT - Continue amiodarone 200 mg daily.  CMET stable in august. Sees HF clinic next week.    3. Atrial fib -  Maintaining NSR on amio and will continue warfarin.  Current medicines are reviewed at length with the patient today.    Disposition:   Follow up with Dr. Lovena Le in 12 months    Signed, Shirley Friar, PA-C  02/15/2021 9:32 AM  [facility] [address] [place] [address] [phone] (office) [phone] (fax)

## 2021-02-15 NOTE — Patient Instructions (Signed)
Medication Instructions:  Your physician recommends that you continue on your current medications as directed. Please refer to the Current Medication list given to you today.  *If you need a refill on your cardiac medications before your next appointment, please call your pharmacy*   Lab Work: None  If you have labs (blood work) drawn today and your tests are completely normal, you will receive your results only by: [place] (if you have MyChart) OR A paper copy in the mail If you have any lab test that is abnormal or we need to change your treatment, we will call you to review the results.   Follow-Up: At [facility], you and your health needs are our priority.  As part of our continuing mission to provide you with exceptional heart care, we have created designated Provider Care Teams.  These Care Teams include your primary Cardiologist (physician) and Advanced Practice Providers (APPs -  Physician Assistants and Nurse Practitioners) who all work together to provide you with the care you need, when you need it.  We recommend signing up for the patient portal called "MyChart".  Sign up information is provided on this After Visit Summary.  MyChart is used to connect with patients for Virtual Visits (Telemedicine).  Patients are able to view lab/test results, encounter notes, upcoming appointments, etc.  Non-urgent messages can be sent to your provider as well.   To learn more about what you can do with MyChart, go to [email or web address].    Your next appointment:   1 year(s)  The format for your next appointment:   In Person  Provider:   You may see Cristopher Peru, MD or one of the following Advanced Practice Providers on your designated Care Team:   Tommye Standard, [place] "[facility]" [place], [place]

## 2021-02-15 NOTE — Patient Instructions (Signed)
Description   Continue taking Warfarin 1 tablet daily except 1/2 tablet on Wednesdays. Recheck INR in 6 weeks. Please call Coumadin Clinic with any changes in medications. [phone].

## 2021-02-22 ENCOUNTER — Other Ambulatory Visit (HOSPITAL_COMMUNITY): Payer: Self-pay | Admitting: Cardiology

## 2021-02-22 ENCOUNTER — Other Ambulatory Visit: Payer: Self-pay

## 2021-02-22 ENCOUNTER — Encounter (HOSPITAL_COMMUNITY): Payer: Self-pay | Admitting: Cardiology

## 2021-02-22 ENCOUNTER — Ambulatory Visit (HOSPITAL_COMMUNITY)
Admission: RE | Admit: 2021-02-22 | Discharge: 2021-02-22 | Disposition: A | Discharge status: Home / Self Care | Payer: Medicare Other | Source: Ambulatory Visit | Attending: Cardiology | Admitting: Cardiology

## 2021-02-22 VITALS — BP 104/70 | HR 69 | Wt 254.8 lb

## 2021-02-22 DIAGNOSIS — I472 Ventricular tachycardia, unspecified: Secondary | ICD-10-CM | POA: Insufficient documentation

## 2021-02-22 DIAGNOSIS — N186 End stage renal disease: Secondary | ICD-10-CM | POA: Diagnosis not present

## 2021-02-22 DIAGNOSIS — I480 Paroxysmal atrial fibrillation: Secondary | ICD-10-CM | POA: Diagnosis not present

## 2021-02-22 DIAGNOSIS — E1122 Type 2 diabetes mellitus with diabetic chronic kidney disease: Secondary | ICD-10-CM | POA: Insufficient documentation

## 2021-02-22 DIAGNOSIS — I5022 Chronic systolic (congestive) heart failure: Secondary | ICD-10-CM | POA: Diagnosis present

## 2021-02-22 DIAGNOSIS — Z992 Dependence on renal dialysis: Secondary | ICD-10-CM | POA: Insufficient documentation

## 2021-02-22 DIAGNOSIS — I454 Nonspecific intraventricular block: Secondary | ICD-10-CM | POA: Diagnosis not present

## 2021-02-22 DIAGNOSIS — I4892 Unspecified atrial flutter: Secondary | ICD-10-CM | POA: Insufficient documentation

## 2021-02-22 DIAGNOSIS — E785 Hyperlipidemia, unspecified: Secondary | ICD-10-CM

## 2021-02-22 DIAGNOSIS — Z95810 Presence of automatic (implantable) cardiac defibrillator: Secondary | ICD-10-CM | POA: Diagnosis not present

## 2021-02-22 DIAGNOSIS — I428 Other cardiomyopathies: Secondary | ICD-10-CM | POA: Insufficient documentation

## 2021-02-22 DIAGNOSIS — Z79899 Other long term (current) drug therapy: Secondary | ICD-10-CM | POA: Diagnosis not present

## 2021-02-22 DIAGNOSIS — Z7901 Long term (current) use of anticoagulants: Secondary | ICD-10-CM | POA: Diagnosis not present

## 2021-02-22 DIAGNOSIS — I5020 Unspecified systolic (congestive) heart failure: Secondary | ICD-10-CM | POA: Diagnosis not present

## 2021-02-22 DIAGNOSIS — E1169 Type 2 diabetes mellitus with other specified complication: Secondary | ICD-10-CM | POA: Diagnosis not present

## 2021-02-22 LAB — LIPID PANEL
Cholesterol: 122 mg/dL (ref 0–200)
HDL: 41 mg/dL (ref >40)
LDL Cholesterol: 43 mg/dL (ref 0–99)
Total CHOL/HDL Ratio: 3 RATIO
Triglycerides: 190 mg/dL — ABNORMAL HIGH (ref <150)
VLDL: 38 mg/dL (ref 0–40)

## 2021-02-22 LAB — COMPREHENSIVE METABOLIC PANEL
ALT: 18 U/L (ref 0–44)
AST: 21 U/L (ref 15–41)
Albumin: 3.8 g/dL (ref 3.5–5.0)
Alkaline Phosphatase: 142 U/L — ABNORMAL HIGH (ref 38–126)
Anion gap: 13 (ref 5–15)
BUN: 40 mg/dL — ABNORMAL HIGH (ref 6–20)
CO2: 29 mmol/L (ref 22–32)
Calcium: 10 mg/dL (ref 8.9–10.3)
Chloride: 91 mmol/L — ABNORMAL LOW (ref 98–111)
Creatinine, Ser: 14.51 mg/dL — ABNORMAL HIGH (ref 0.61–1.24)
GFR, Estimated: 4 mL/min — ABNORMAL LOW (ref >60)
Glucose, Bld: 102 mg/dL — ABNORMAL HIGH (ref 70–99)
Potassium: 5 mmol/L (ref 3.5–5.1)
Sodium: 133 mmol/L — ABNORMAL LOW (ref 135–145)
Total Bilirubin: 0.9 mg/dL (ref 0.3–1.2)
Total Protein: 7.8 g/dL (ref 6.5–8.1)

## 2021-02-22 LAB — TSH: TSH: 4.698 u[IU]/mL — ABNORMAL HIGH (ref 0.350–4.500)

## 2021-02-22 LAB — T4, FREE: Free T4: 1.21 ng/dL — ABNORMAL HIGH (ref 0.61–1.12)

## 2021-02-22 NOTE — Progress Notes (Signed)
Remote ICD transmission.   

## 2021-02-22 NOTE — Progress Notes (Signed)
PCP: [name], FNP HF Cardiology: Dr. Aundra Dubin EP: Dr. Lovena Le Nephrology: HD in [place], [place]  Travis Bryan is a 50 y.o. male with history of poorly controlled DM, atrial flutter and fibrillation, nonischemic cardiomyopathy, and ESRD presents for followup of CHF.  Cardiomyopathy has been known for a number of years. Cath in 6/06 showed no obstructive coronary disease.  He had ICD discharge for VT in 10/17 and again in 1/18, both times likely in the setting of CHF exacerbation.    He was admitted in 1/18 with exertional dyspnea/CHF exacerbation.  ICD had also discharged for VT.  He was noted to be in new atrial flutter.  He had a flutter ablation.  However, post-procedure, he developed AKI and cardiogenic shock.  Dobutamine and norepinephrine were required but eventually titrated off.  He developed atrial fibrillation subsequent to the atrial flutter ablation.  He had DCCV to NSR 04/11/16.  He is on amiodarone.   Echo 09/2016 EF 25-30%, moderate dilation, moderate LVH, mild MR, moderately decreased RV systolic function, PASP 51 mmHg.  Echo in 12/19 with EF 25-30%.    He progressed to ESRD in 2/20.   He went back into atrial fibrillation in 7/20 and failed DCCV, but converted to NSR on amiodarone.   He has been doing well recently.  Volume is controlled well by HD.   He is now living in [place], [place] but wants to continue cardiology care in [place].  He gets HD in [place].  No palpitations, he is in NSR today.  BP sometimes low with HD.  No lightheadedness.  No dyspnea walking on flat ground.  No chest pain.  He is tired after walking about 100 yards.  Weight up about 6 lbs.   ECG (personally reviewed): NSR, 1st degree AVB, IVCD 126 msec  Labs (2/18): hgb 14.6, K 4.9, creatinine 3.43 Labs 04/26/2016: K 4.1 Creatinine 2.79, TSH normal, LFTs normal, hgb 14.4 Labs (4/18): K 3.7, creatinine 2.61 Labs (6/18): LDL 167, HDL 49, hgb A1c 6 Labs (8/20): Low TSH, normal T3, elevated free T4.   Labs (10/20): LDL 86, HDL 61, elevated TSH, elevated free T4, normal free T3 Labs (8/22): LFTs normal  St Jude device interrogation: Stable thoracic impedance  PMH: 1. Chronic systolic CHF: Nonischemic cardiomyopathy.  St Jude ICD.  - LHC (6/06): Nonobstructive CAD.  - Echo (1/18): EF 20%, moderate RV dilation with moderate-severely decreased RV systolic function, PASP 48 mmHg.  - Cardiolite (1/18): EF 7%, large fixed inferolateral defect no ischemia.  - Echo (12/19): EF 25-30%.  2. Atrial flutter: s/p ablation 1/18.  3. Atrial fibrillation: Paroxysmal.  S/p DCCV 1/18.  4. ESRD 5. Type II diabetes: Poorly controlled over time.  6. VT: 10/17 and 1/18 with ICD discharge.    Review of systems complete and found to be negative unless listed in HPI.    Social History   Socioeconomic History   Marital status: Widowed    Spouse name: Not on file   Number of children: Not on file   Years of education: Not on file   Highest education level: Not on file  Occupational History   Not on file  Tobacco Use   Smoking status: Never   Smokeless tobacco: Never   Tobacco comments:    pt denies smoking cigars  Vaping Use   Vaping Use: Never used  Substance and Sexual Activity   Alcohol use: No   Drug use: No   Sexual activity: Not Currently  Other Topics Concern  Not on file  Social History Narrative   Not on file   Social Determinants of Health   Financial Resource Strain: Not on file  Food Insecurity: Not on file  Transportation Needs: Not on file  Physical Activity: Not on file  Stress: Not on file  Social Connections: Not on file   Family History  Problem Relation Age of Onset   Hypertension Mother    Heart disease Mother    Diabetes Mother    Review of systems complete and found to be negative unless listed in HPI.    Current Outpatient Medications  Medication Sig Dispense Refill   amiodarone (PACERONE) 200 MG tablet TAKE 1 TABLET BY MOUTH DAILY. 90 tablet 3    atorvastatin (LIPITOR) 40 MG tablet TAKE 1 TABLET (40 MG TOTAL) BY MOUTH DAILY AT 6 PM. 90 tablet 3   metoprolol succinate (TOPROL-XL) 25 MG 24 hr tablet Take 1 tablet (25 mg total) by mouth daily. Must keep pending appt for further refill 30 tablet 6   sevelamer carbonate (RENVELA) 800 MG tablet Take 2,400 mg by mouth with breakfast, with lunch, and with evening meal.     warfarin (COUMADIN) 5 MG tablet Take 1/2 to 1 tablet once daily as directed by [facility] 35 tablet 1   No current facility-administered medications for this encounter.   Vitals:   02/22/21 1018  BP: 104/70  Pulse: 69  SpO2: 97%  Weight: 115.6 kg (254 lb 12.8 oz)   Wt Readings from Last 3 Encounters:  02/22/21 115.6 kg (254 lb 12.8 oz)  02/15/21 113.2 kg (249 lb 9.6 oz)  06/03/20 112.8 kg (248 lb 9.6 oz)   Physical Exam General: NAD Neck: No JVD, no thyromegaly or thyroid nodule.  Lungs: Clear to auscultation bilaterally with normal respiratory effort. CV: Nondisplaced PMI.  Heart regular S1/S2, no S3/S4, no murmur.  No peripheral edema.  No carotid bruit.  Normal pedal pulses.  Abdomen: Soft, nontender, no hepatosplenomegaly, no distention.  Skin: Intact without lesions or rashes.  Neurologic: Alert and oriented x 3.  Psych: Normal affect. Extremities: No clubbing or cyanosis.  HEENT: Normal.   Assessment/Plan: 1. Chronic systolic CHF: Nonischemic cardiomyopathy.  St Jude ICD.  Echo 1/18 with EF 20% and moderate to severely decreased RV systolic function.  Echo 6/18 with EF 25-30%, moderate dilation, moderate LVH, mild MR, moderately decreased RV systolic function, PASP 51 mmHg. Echo in 12/19 with EF 25-30%.  At this point, volume is well-controlled at HD.  NYHA class II symptoms. Unlikely to tolerate additional cardiac meds due to soft BP.  - Continue Toprol XL 25 mg daily, will not increase with soft BP at HD.   - He has IVCD 126 msec without true LBBB, unlikely to benefit from CRT (also increased  infection risk with procedure due to ESRD).  - I will arrange for echo at followup in 6 months.  2. Atrial fibrillation: He is in NSR today on amiodarone.  - Continue amiodarone 200 mg daily.  Check LFTs, TSH, free T3, and free T4 today.  He will need a regular eye exam.   - Continue warfarin for anticoagulation.  3. Diabetes: Per PCP.  - Continue statin, check lipids today.  4. VT: Now on amiodarone.  5. ESRD: Per nephrology.   Followup in 6 months with echo.   Loralie Champagne, MD  02/22/2021

## 2021-02-22 NOTE — Patient Instructions (Signed)
EKG done today.  Labs done today. We will contact you only if your labs are abnormal.  No medication changes were made. Please continue all current medications as prescribed.  Your physician recommends that you schedule a follow-up appointment in: 6 months with an echo prior to your exam. Please contact our office in May 2023 to schedule a June 2023 appointment.   Your physician has requested that you have an echocardiogram. Echocardiography is a painless test that uses sound waves to create images of your heart. It provides your doctor with information about the size and shape of your heart and how well your hearts chambers and valves are working. This procedure takes approximately one hour. There are no restrictions for this procedure.  If you have any questions or concerns before your next appointment please send [place] a message through [place] or call our office at [phone].    TO LEAVE A MESSAGE FOR THE NURSE SELECT OPTION 2, PLEASE LEAVE A MESSAGE INCLUDING: YOUR NAME DATE OF BIRTH CALL BACK NUMBER REASON FOR CALL**this is important as we prioritize the call backs  YOU WILL RECEIVE A CALL BACK THE SAME DAY AS LONG AS YOU CALL BEFORE 4:00 PM   Do the following things EVERYDAY: Weigh yourself in the morning before breakfast. Write it down and keep it in a log. Take your medicines as prescribed Eat low salt foods--Limit salt (sodium) to 2000 mg per day.  Stay as active as you can everyday Limit all fluids for the day to less than 2 liters   At the [facility], you and your health needs are our priority. As part of our continuing mission to provide you with exceptional heart care, we have created designated Provider Care Teams. These Care Teams include your primary Cardiologist (physician) and Advanced Practice Providers (APPs- Physician Assistants and Nurse Practitioners) who all work together to provide you with the care you need, when you need it.   You may see  any of the following providers on your designated Care Team at your next follow up: Dr Glori Bickers Dr Haynes Kerns, NP Lyda Jester, [place] Audry Riles, PharmD   Please be sure to bring in all your medications bottles to every appointment.

## 2021-02-22 NOTE — Progress Notes (Addendum)
Error

## 2021-02-22 NOTE — Progress Notes (Addendum)
error 

## 2021-02-23 ENCOUNTER — Other Ambulatory Visit: Payer: Self-pay

## 2021-02-23 LAB — T3, FREE: T3, Free: 3.2 pg/mL (ref 2.0–4.4)

## 2021-02-23 MED ORDER — METOPROLOL SUCCINATE ER 25 MG PO TB24
25.0000 mg | ORAL_TABLET | Freq: Every day | ORAL | 5 refills | Status: DC
  Filled 2021-02-23 – 2021-05-30 (×2): qty 90, 90d supply, fill #0

## 2021-02-28 ENCOUNTER — Other Ambulatory Visit: Payer: Self-pay

## 2021-02-28 ENCOUNTER — Other Ambulatory Visit (HOSPITAL_COMMUNITY): Payer: Self-pay

## 2021-03-10 ENCOUNTER — Other Ambulatory Visit: Payer: Self-pay

## 2021-03-25 ENCOUNTER — Other Ambulatory Visit: Payer: Self-pay

## 2021-03-25 DIAGNOSIS — I4819 Other persistent atrial fibrillation: Secondary | ICD-10-CM

## 2021-03-25 DIAGNOSIS — I483 Typical atrial flutter: Secondary | ICD-10-CM

## 2021-03-25 DIAGNOSIS — Z5181 Encounter for therapeutic drug level monitoring: Secondary | ICD-10-CM

## 2021-03-25 DIAGNOSIS — Z95810 Presence of automatic (implantable) cardiac defibrillator: Secondary | ICD-10-CM

## 2021-03-25 MED ORDER — WARFARIN SODIUM 5 MG PO TABS: ORAL_TABLET | ORAL | 1 refills | Status: DC

## 2021-04-05 ENCOUNTER — Other Ambulatory Visit: Payer: Self-pay

## 2021-04-05 ENCOUNTER — Ambulatory Visit (INDEPENDENT_AMBULATORY_CARE_PROVIDER_SITE_OTHER): Payer: 59

## 2021-04-05 DIAGNOSIS — Z95810 Presence of automatic (implantable) cardiac defibrillator: Secondary | ICD-10-CM | POA: Diagnosis not present

## 2021-04-05 DIAGNOSIS — I4819 Other persistent atrial fibrillation: Secondary | ICD-10-CM

## 2021-04-05 LAB — POCT INR: INR: 2.3 (ref 2.0–3.0)

## 2021-04-05 NOTE — Patient Instructions (Signed)
Description   Continue taking Warfarin 1 tablet daily except 1/2 tablet on Wednesdays. Recheck INR in 6 weeks. Please call [facility] with any changes in medications. [phone].

## 2021-04-18 ENCOUNTER — Other Ambulatory Visit: Payer: Self-pay

## 2021-05-12 ENCOUNTER — Ambulatory Visit (INDEPENDENT_AMBULATORY_CARE_PROVIDER_SITE_OTHER): Payer: 59

## 2021-05-12 DIAGNOSIS — I428 Other cardiomyopathies: Secondary | ICD-10-CM | POA: Diagnosis not present

## 2021-05-12 LAB — CUP PACEART REMOTE DEVICE CHECK
Battery Remaining Longevity: 37 mo
Battery Remaining Percentage: 37 %
Battery Voltage: 2.86 V
Brady Statistic RV Percent Paced: 1 %
Date Time Interrogation Session: 20230302020031
HighPow Impedance: 70 Ohm
HighPow Impedance: 71 Ohm
Implantable Lead Implant Date: 20090211
Implantable Lead Location: 753860
Implantable Lead Model: 7121
Implantable Pulse Generator Implant Date: 20150617
Lead Channel Impedance Value: 440 Ohm
Lead Channel Pacing Threshold Amplitude: 1.25 V
Lead Channel Pacing Threshold Pulse Width: 0.7 ms
Lead Channel Sensing Intrinsic Amplitude: 9.9 mV
Lead Channel Setting Pacing Amplitude: 2.5 V
Lead Channel Setting Pacing Pulse Width: 0.7 ms
Lead Channel Setting Sensing Sensitivity: 0.5 mV
Pulse Gen Serial Number: 7200640

## 2021-05-19 NOTE — Progress Notes (Signed)
Remote ICD transmission.   

## 2021-05-26 ENCOUNTER — Ambulatory Visit (INDEPENDENT_AMBULATORY_CARE_PROVIDER_SITE_OTHER): Payer: 59 | Admitting: *Deleted

## 2021-05-26 ENCOUNTER — Other Ambulatory Visit: Payer: Self-pay

## 2021-05-26 DIAGNOSIS — I4819 Other persistent atrial fibrillation: Secondary | ICD-10-CM | POA: Diagnosis not present

## 2021-05-26 DIAGNOSIS — Z95810 Presence of automatic (implantable) cardiac defibrillator: Secondary | ICD-10-CM | POA: Diagnosis not present

## 2021-05-26 LAB — POCT INR: INR: 2.5 (ref 2.0–3.0)

## 2021-05-26 NOTE — Patient Instructions (Signed)
Description   ?Continue taking Warfarin 1 tablet daily except 1/2 tablet on Wednesdays. Recheck INR in 6 weeks. Please call [facility] Clinic with any changes in medications. [phone]. ?  ?  ?

## 2021-05-30 ENCOUNTER — Other Ambulatory Visit: Payer: Self-pay

## 2021-06-02 ENCOUNTER — Other Ambulatory Visit: Payer: Self-pay

## 2021-06-03 ENCOUNTER — Other Ambulatory Visit: Payer: Self-pay

## 2021-06-06 ENCOUNTER — Other Ambulatory Visit: Payer: Self-pay

## 2021-06-06 DIAGNOSIS — Z5181 Encounter for therapeutic drug level monitoring: Secondary | ICD-10-CM

## 2021-06-06 DIAGNOSIS — I483 Typical atrial flutter: Secondary | ICD-10-CM

## 2021-06-06 DIAGNOSIS — Z95810 Presence of automatic (implantable) cardiac defibrillator: Secondary | ICD-10-CM

## 2021-06-06 DIAGNOSIS — I4819 Other persistent atrial fibrillation: Secondary | ICD-10-CM

## 2021-06-06 MED ORDER — WARFARIN SODIUM 5 MG PO TABS: ORAL_TABLET | ORAL | 1 refills | Status: DC

## 2021-07-18 ENCOUNTER — Other Ambulatory Visit (HOSPITAL_COMMUNITY): Payer: Self-pay

## 2021-07-18 ENCOUNTER — Other Ambulatory Visit (HOSPITAL_COMMUNITY): Payer: Self-pay | Admitting: Cardiology

## 2021-07-18 ENCOUNTER — Other Ambulatory Visit: Payer: Self-pay

## 2021-07-18 MED ORDER — AMIODARONE HCL 200 MG PO TABS
ORAL_TABLET | Freq: Every day | ORAL | 3 refills | Status: DC
  Filled 2021-07-18 (×2): qty 90, 90d supply, fill #0
  Filled 2021-10-17: qty 90, 90d supply, fill #1
  Filled 2022-01-13: qty 90, 90d supply, fill #2
  Filled 2022-04-13: qty 90, 90d supply, fill #3

## 2021-07-21 ENCOUNTER — Ambulatory Visit (INDEPENDENT_AMBULATORY_CARE_PROVIDER_SITE_OTHER): Payer: 59

## 2021-07-21 DIAGNOSIS — Z95810 Presence of automatic (implantable) cardiac defibrillator: Secondary | ICD-10-CM

## 2021-07-21 DIAGNOSIS — I4819 Other persistent atrial fibrillation: Secondary | ICD-10-CM

## 2021-07-21 DIAGNOSIS — Z5181 Encounter for therapeutic drug level monitoring: Secondary | ICD-10-CM | POA: Diagnosis not present

## 2021-07-21 DIAGNOSIS — I482 Chronic atrial fibrillation, unspecified: Secondary | ICD-10-CM | POA: Diagnosis not present

## 2021-07-21 LAB — POCT INR: INR: 1.9 — AB (ref 2.0–3.0)

## 2021-07-21 NOTE — Patient Instructions (Signed)
Description   ?Take 1.5 tablets today and then continue taking Warfarin 1 tablet daily except 1/2 tablet on Wednesdays.  ?Recheck INR in 5 weeks.  ?Please call [facility] Clinic with any changes in medications. [phone]. ?  ?   ?

## 2021-08-11 ENCOUNTER — Ambulatory Visit (INDEPENDENT_AMBULATORY_CARE_PROVIDER_SITE_OTHER): Payer: 59

## 2021-08-11 DIAGNOSIS — I428 Other cardiomyopathies: Secondary | ICD-10-CM | POA: Diagnosis not present

## 2021-08-11 LAB — CUP PACEART REMOTE DEVICE CHECK
Battery Remaining Longevity: 35 mo
Battery Remaining Percentage: 33 %
Battery Voltage: 2.84 V
Brady Statistic RV Percent Paced: 1 %
Date Time Interrogation Session: 20230601020015
HighPow Impedance: 70 Ohm
HighPow Impedance: 70 Ohm
Implantable Lead Implant Date: 20090211
Implantable Lead Location: 753860
Implantable Lead Model: 7121
Implantable Pulse Generator Implant Date: 20150617
Lead Channel Impedance Value: 460 Ohm
Lead Channel Pacing Threshold Amplitude: 1.25 V
Lead Channel Pacing Threshold Pulse Width: 0.7 ms
Lead Channel Sensing Intrinsic Amplitude: 10.7 mV
Lead Channel Setting Pacing Amplitude: 2.5 V
Lead Channel Setting Pacing Pulse Width: 0.7 ms
Lead Channel Setting Sensing Sensitivity: 0.5 mV
Pulse Gen Serial Number: 7200640

## 2021-08-18 ENCOUNTER — Other Ambulatory Visit: Payer: Self-pay

## 2021-08-18 DIAGNOSIS — Z5181 Encounter for therapeutic drug level monitoring: Secondary | ICD-10-CM

## 2021-08-18 DIAGNOSIS — I483 Typical atrial flutter: Secondary | ICD-10-CM

## 2021-08-18 DIAGNOSIS — I4819 Other persistent atrial fibrillation: Secondary | ICD-10-CM

## 2021-08-18 DIAGNOSIS — Z95810 Presence of automatic (implantable) cardiac defibrillator: Secondary | ICD-10-CM

## 2021-08-18 MED ORDER — WARFARIN SODIUM 5 MG PO TABS: ORAL_TABLET | ORAL | 1 refills | Status: DC

## 2021-08-19 NOTE — Progress Notes (Signed)
Remote ICD transmission.   

## 2021-08-22 ENCOUNTER — Other Ambulatory Visit: Payer: Self-pay | Admitting: Cardiology

## 2021-08-22 ENCOUNTER — Other Ambulatory Visit (HOSPITAL_COMMUNITY): Payer: Self-pay | Admitting: Cardiology

## 2021-08-22 ENCOUNTER — Other Ambulatory Visit: Payer: Self-pay

## 2021-08-22 DIAGNOSIS — E1169 Type 2 diabetes mellitus with other specified complication: Secondary | ICD-10-CM

## 2021-08-22 MED ORDER — ATORVASTATIN CALCIUM 40 MG PO TABS
ORAL_TABLET | Freq: Every day | ORAL | 3 refills | Status: DC
  Filled 2021-08-22: qty 90, 90d supply, fill #0
  Filled 2021-11-23: qty 90, 90d supply, fill #1
  Filled 2022-02-21: qty 90, 90d supply, fill #2
  Filled 2022-05-19: qty 90, 90d supply, fill #3

## 2021-08-22 MED ORDER — METOPROLOL SUCCINATE ER 25 MG PO TB24
25.0000 mg | ORAL_TABLET | Freq: Every day | ORAL | 5 refills | Status: DC
  Filled 2021-08-22: qty 30, 30d supply, fill #0
  Filled 2021-09-27: qty 30, 30d supply, fill #1
  Filled 2021-10-25: qty 30, 30d supply, fill #2
  Filled 2021-11-23: qty 30, 30d supply, fill #3
  Filled 2021-12-23: qty 30, 30d supply, fill #4
  Filled 2022-01-23: qty 30, 30d supply, fill #0

## 2021-08-23 ENCOUNTER — Other Ambulatory Visit: Payer: Self-pay

## 2021-08-25 ENCOUNTER — Ambulatory Visit (INDEPENDENT_AMBULATORY_CARE_PROVIDER_SITE_OTHER): Payer: 59

## 2021-08-25 DIAGNOSIS — Z95810 Presence of automatic (implantable) cardiac defibrillator: Secondary | ICD-10-CM

## 2021-08-25 DIAGNOSIS — I4819 Other persistent atrial fibrillation: Secondary | ICD-10-CM | POA: Diagnosis not present

## 2021-08-25 DIAGNOSIS — Z5181 Encounter for therapeutic drug level monitoring: Secondary | ICD-10-CM

## 2021-08-25 LAB — POCT INR: INR: 2.3 (ref 2.0–3.0)

## 2021-08-25 NOTE — Patient Instructions (Signed)
Description   Continue taking Warfarin 1 tablet daily except 1/2 tablet on Wednesdays.  Recheck INR in 6 weeks.  Please call [facility] Clinic with any changes in medications. [phone].

## 2021-09-27 ENCOUNTER — Other Ambulatory Visit: Payer: Self-pay

## 2021-10-13 ENCOUNTER — Ambulatory Visit (INDEPENDENT_AMBULATORY_CARE_PROVIDER_SITE_OTHER): Payer: 59

## 2021-10-13 DIAGNOSIS — I4819 Other persistent atrial fibrillation: Secondary | ICD-10-CM

## 2021-10-13 DIAGNOSIS — Z5181 Encounter for therapeutic drug level monitoring: Secondary | ICD-10-CM | POA: Diagnosis not present

## 2021-10-13 DIAGNOSIS — Z95810 Presence of automatic (implantable) cardiac defibrillator: Secondary | ICD-10-CM | POA: Diagnosis not present

## 2021-10-13 LAB — POCT INR: INR: 2.8 (ref 2.0–3.0)

## 2021-10-13 NOTE — Patient Instructions (Signed)
Continue taking Warfarin 1 tablet daily except 1/2 tablet on Wednesdays.  Recheck INR in 6 weeks.  Please call [facility] with any changes in medications. [phone].

## 2021-10-17 ENCOUNTER — Other Ambulatory Visit: Payer: Self-pay

## 2021-10-25 ENCOUNTER — Other Ambulatory Visit: Payer: Self-pay

## 2021-11-01 ENCOUNTER — Other Ambulatory Visit: Payer: Self-pay | Admitting: *Deleted

## 2021-11-01 DIAGNOSIS — Z5181 Encounter for therapeutic drug level monitoring: Secondary | ICD-10-CM

## 2021-11-01 DIAGNOSIS — I4819 Other persistent atrial fibrillation: Secondary | ICD-10-CM

## 2021-11-01 DIAGNOSIS — Z95810 Presence of automatic (implantable) cardiac defibrillator: Secondary | ICD-10-CM

## 2021-11-01 DIAGNOSIS — I483 Typical atrial flutter: Secondary | ICD-10-CM

## 2021-11-01 MED ORDER — WARFARIN SODIUM 5 MG PO TABS: ORAL_TABLET | ORAL | 2 refills | Status: DC

## 2021-11-01 NOTE — Telephone Encounter (Signed)
Pt called in to the office for a warfarin refill. Last seen Dr. Aundra Dubin 02/22/2021 last INR check 10/13/2021

## 2021-11-10 ENCOUNTER — Ambulatory Visit (INDEPENDENT_AMBULATORY_CARE_PROVIDER_SITE_OTHER): Payer: 59

## 2021-11-10 DIAGNOSIS — I428 Other cardiomyopathies: Secondary | ICD-10-CM | POA: Diagnosis not present

## 2021-11-10 LAB — CUP PACEART REMOTE DEVICE CHECK
Battery Remaining Longevity: 31 mo
Battery Remaining Percentage: 30 %
Battery Voltage: 2.83 V
Brady Statistic RV Percent Paced: 1 %
Date Time Interrogation Session: 20230831020016
HighPow Impedance: 62 Ohm
HighPow Impedance: 62 Ohm
Implantable Lead Implant Date: 20090211
Implantable Lead Location: 753860
Implantable Lead Model: 7121
Implantable Pulse Generator Implant Date: 20150617
Lead Channel Impedance Value: 410 Ohm
Lead Channel Pacing Threshold Amplitude: 1.25 V
Lead Channel Pacing Threshold Pulse Width: 0.7 ms
Lead Channel Sensing Intrinsic Amplitude: 8.8 mV
Lead Channel Setting Pacing Amplitude: 2.5 V
Lead Channel Setting Pacing Pulse Width: 0.7 ms
Lead Channel Setting Sensing Sensitivity: 0.5 mV
Pulse Gen Serial Number: 7200640

## 2021-11-23 ENCOUNTER — Other Ambulatory Visit: Payer: Self-pay

## 2021-11-24 ENCOUNTER — Ambulatory Visit: Payer: 59 | Attending: Cardiology

## 2021-11-24 DIAGNOSIS — Z5181 Encounter for therapeutic drug level monitoring: Secondary | ICD-10-CM | POA: Diagnosis not present

## 2021-11-24 DIAGNOSIS — Z95810 Presence of automatic (implantable) cardiac defibrillator: Secondary | ICD-10-CM | POA: Diagnosis not present

## 2021-11-24 DIAGNOSIS — I4819 Other persistent atrial fibrillation: Secondary | ICD-10-CM | POA: Diagnosis not present

## 2021-11-24 LAB — POCT INR: INR: 2 (ref 2.0–3.0)

## 2021-11-24 NOTE — Patient Instructions (Signed)
Continue taking Warfarin 1 tablet daily except 1/2 tablet on Wednesdays.  Recheck INR in 6 weeks.  Please call [facility] with any changes in medications. [phone].

## 2021-12-01 NOTE — Progress Notes (Signed)
Remote ICD transmission.   

## 2021-12-23 ENCOUNTER — Other Ambulatory Visit: Payer: Self-pay

## 2022-01-05 ENCOUNTER — Ambulatory Visit: Payer: 59

## 2022-01-10 ENCOUNTER — Ambulatory Visit: Payer: 59 | Attending: Cardiology | Admitting: *Deleted

## 2022-01-10 DIAGNOSIS — Z95810 Presence of automatic (implantable) cardiac defibrillator: Secondary | ICD-10-CM | POA: Diagnosis not present

## 2022-01-10 DIAGNOSIS — I4819 Other persistent atrial fibrillation: Secondary | ICD-10-CM

## 2022-01-10 LAB — POCT INR: INR: 2.9 (ref 2.0–3.0)

## 2022-01-10 NOTE — Patient Instructions (Addendum)
Description   DO NOT COME HOURS EARLY TO APPOINTMENT & CALL IF YOU NEED TO RESCHEDULE. Continue taking Warfarin 1 tablet daily except 1/2 tablet on Wednesdays.  Recheck INR in 6 weeks. Please call [facility] with any changes in medications. [phone] OR [phone]

## 2022-01-13 ENCOUNTER — Other Ambulatory Visit: Payer: Self-pay

## 2022-01-16 ENCOUNTER — Other Ambulatory Visit: Payer: Self-pay

## 2022-01-23 ENCOUNTER — Other Ambulatory Visit: Payer: Self-pay

## 2022-01-23 ENCOUNTER — Other Ambulatory Visit (HOSPITAL_COMMUNITY): Payer: Self-pay

## 2022-02-09 ENCOUNTER — Ambulatory Visit (INDEPENDENT_AMBULATORY_CARE_PROVIDER_SITE_OTHER): Payer: 59

## 2022-02-09 DIAGNOSIS — I428 Other cardiomyopathies: Secondary | ICD-10-CM | POA: Diagnosis not present

## 2022-02-09 LAB — CUP PACEART REMOTE DEVICE CHECK
Battery Remaining Longevity: 28 mo
Battery Remaining Percentage: 27 %
Battery Voltage: 2.81 V
Brady Statistic RV Percent Paced: 1 %
Date Time Interrogation Session: 20231130030409
HighPow Impedance: 61 Ohm
HighPow Impedance: 61 Ohm
Implantable Lead Connection Status: 753985
Implantable Lead Implant Date: 20090211
Implantable Lead Location: 753860
Implantable Lead Model: 7121
Implantable Pulse Generator Implant Date: 20150617
Lead Channel Impedance Value: 410 Ohm
Lead Channel Pacing Threshold Amplitude: 1.25 V
Lead Channel Pacing Threshold Pulse Width: 0.7 ms
Lead Channel Sensing Intrinsic Amplitude: 8 mV
Lead Channel Setting Pacing Amplitude: 2.5 V
Lead Channel Setting Pacing Pulse Width: 0.7 ms
Lead Channel Setting Sensing Sensitivity: 0.5 mV
Pulse Gen Serial Number: 7200640

## 2022-02-21 ENCOUNTER — Ambulatory Visit: Payer: 59 | Attending: Internal Medicine

## 2022-02-21 ENCOUNTER — Other Ambulatory Visit (HOSPITAL_COMMUNITY): Payer: Self-pay | Admitting: Internal Medicine

## 2022-02-21 ENCOUNTER — Other Ambulatory Visit: Payer: Self-pay

## 2022-02-21 DIAGNOSIS — I4819 Other persistent atrial fibrillation: Secondary | ICD-10-CM | POA: Diagnosis not present

## 2022-02-21 DIAGNOSIS — Z95810 Presence of automatic (implantable) cardiac defibrillator: Secondary | ICD-10-CM | POA: Diagnosis not present

## 2022-02-21 DIAGNOSIS — I483 Typical atrial flutter: Secondary | ICD-10-CM

## 2022-02-21 DIAGNOSIS — Z5181 Encounter for therapeutic drug level monitoring: Secondary | ICD-10-CM | POA: Diagnosis not present

## 2022-02-21 LAB — POCT INR: INR: 2.8 (ref 2.0–3.0)

## 2022-02-21 MED ORDER — METOPROLOL SUCCINATE ER 25 MG PO TB24
25.0000 mg | ORAL_TABLET | Freq: Every day | ORAL | 5 refills | Status: DC
  Filled 2022-02-21: qty 30, 30d supply, fill #0
  Filled 2022-03-23: qty 30, 30d supply, fill #1
  Filled 2022-04-20: qty 30, 30d supply, fill #0
  Filled 2022-05-19: qty 90, 90d supply, fill #1

## 2022-02-21 MED ORDER — WARFARIN SODIUM 5 MG PO TABS: ORAL_TABLET | ORAL | 2 refills | Status: DC

## 2022-02-21 NOTE — Patient Instructions (Signed)
DO NOT COME HOURS EARLY TO APPOINTMENT & CALL IF YOU NEED TO RESCHEDULE. Continue taking Warfarin 1 tablet daily except 1/2 tablet on Wednesdays.  Recheck INR in 6 weeks. Please call [facility] Clinic with any changes in medications. [phone] OR [phone]

## 2022-03-01 NOTE — Progress Notes (Signed)
Remote ICD transmission.   

## 2022-03-23 ENCOUNTER — Other Ambulatory Visit: Payer: Self-pay

## 2022-04-04 ENCOUNTER — Ambulatory Visit: Payer: 59 | Attending: Cardiology | Admitting: *Deleted

## 2022-04-04 DIAGNOSIS — Z5181 Encounter for therapeutic drug level monitoring: Secondary | ICD-10-CM

## 2022-04-04 DIAGNOSIS — I4819 Other persistent atrial fibrillation: Secondary | ICD-10-CM | POA: Diagnosis not present

## 2022-04-04 DIAGNOSIS — Z95810 Presence of automatic (implantable) cardiac defibrillator: Secondary | ICD-10-CM | POA: Diagnosis not present

## 2022-04-04 LAB — POCT INR: INR: 2.5 (ref 2.0–3.0)

## 2022-04-04 NOTE — Patient Instructions (Signed)
Description   DO NOT COME HOURS EARLY TO APPOINTMENT & CALL IF YOU NEED TO RESCHEDULE. Continue taking Warfarin 1 tablet daily except 1/2 tablet on Wednesdays.  Recheck INR in 6 weeks. Please call [facility] Clinic with any changes in medications. [phone] OR [phone]

## 2022-04-13 ENCOUNTER — Other Ambulatory Visit: Payer: Self-pay

## 2022-04-20 ENCOUNTER — Other Ambulatory Visit (HOSPITAL_COMMUNITY): Payer: Self-pay

## 2022-04-20 ENCOUNTER — Other Ambulatory Visit: Payer: Self-pay

## 2022-05-11 ENCOUNTER — Ambulatory Visit: Payer: 59

## 2022-05-11 DIAGNOSIS — I428 Other cardiomyopathies: Secondary | ICD-10-CM

## 2022-05-11 LAB — CUP PACEART REMOTE DEVICE CHECK
Battery Remaining Longevity: 25 mo
Battery Remaining Percentage: 24 %
Battery Voltage: 2.8 V
Brady Statistic RV Percent Paced: 1 %
Date Time Interrogation Session: 20240229020015
HighPow Impedance: 59 Ohm
HighPow Impedance: 59 Ohm
Implantable Lead Connection Status: 753985
Implantable Lead Implant Date: 20090211
Implantable Lead Location: 753860
Implantable Lead Model: 7121
Implantable Pulse Generator Implant Date: 20150617
Lead Channel Impedance Value: 410 Ohm
Lead Channel Pacing Threshold Amplitude: 1.25 V
Lead Channel Pacing Threshold Pulse Width: 0.7 ms
Lead Channel Sensing Intrinsic Amplitude: 8.7 mV
Lead Channel Setting Pacing Amplitude: 2.5 V
Lead Channel Setting Pacing Pulse Width: 0.7 ms
Lead Channel Setting Sensing Sensitivity: 0.5 mV
Pulse Gen Serial Number: 7200640

## 2022-05-16 ENCOUNTER — Ambulatory Visit: Payer: 59 | Attending: Cardiology | Admitting: *Deleted

## 2022-05-16 DIAGNOSIS — I4819 Other persistent atrial fibrillation: Secondary | ICD-10-CM

## 2022-05-16 DIAGNOSIS — Z5181 Encounter for therapeutic drug level monitoring: Secondary | ICD-10-CM | POA: Diagnosis not present

## 2022-05-16 DIAGNOSIS — Z95810 Presence of automatic (implantable) cardiac defibrillator: Secondary | ICD-10-CM | POA: Diagnosis not present

## 2022-05-16 LAB — POCT INR: POC INR: 2.4

## 2022-05-16 NOTE — Patient Instructions (Signed)
Description   Continue taking Warfarin 1 tablet daily except 1/2 tablet on Wednesdays.  Recheck INR in 6 weeks. Please call [facility] Clinic with any changes in medications. [phone] OR [phone]

## 2022-05-19 ENCOUNTER — Other Ambulatory Visit: Payer: Self-pay

## 2022-06-12 ENCOUNTER — Other Ambulatory Visit: Payer: Self-pay | Admitting: Pharmacist

## 2022-06-12 DIAGNOSIS — I483 Typical atrial flutter: Secondary | ICD-10-CM

## 2022-06-12 DIAGNOSIS — Z5181 Encounter for therapeutic drug level monitoring: Secondary | ICD-10-CM

## 2022-06-12 DIAGNOSIS — Z95810 Presence of automatic (implantable) cardiac defibrillator: Secondary | ICD-10-CM

## 2022-06-12 DIAGNOSIS — I4819 Other persistent atrial fibrillation: Secondary | ICD-10-CM

## 2022-06-12 MED ORDER — WARFARIN SODIUM 5 MG PO TABS
ORAL_TABLET | ORAL | 2 refills | Status: DC
Start: 2022-06-12 — End: 2022-10-02

## 2022-06-12 NOTE — Progress Notes (Signed)
Remote ICD transmission.   

## 2022-06-27 ENCOUNTER — Ambulatory Visit: Payer: 59 | Attending: Cardiovascular Disease | Admitting: *Deleted

## 2022-06-27 DIAGNOSIS — I4819 Other persistent atrial fibrillation: Secondary | ICD-10-CM

## 2022-06-27 DIAGNOSIS — Z95810 Presence of automatic (implantable) cardiac defibrillator: Secondary | ICD-10-CM | POA: Diagnosis not present

## 2022-06-27 DIAGNOSIS — Z5181 Encounter for therapeutic drug level monitoring: Secondary | ICD-10-CM

## 2022-06-27 LAB — POCT INR: INR: 2.4 (ref 2.0–3.0)

## 2022-06-27 NOTE — Patient Instructions (Signed)
Description   Continue taking Warfarin 1 tablet daily except 1/2 tablet on Wednesdays.  Recheck INR in 6 weeks. Please call [facility] Clinic with any changes in medications. [phone] OR [phone]     

## 2022-07-14 ENCOUNTER — Other Ambulatory Visit: Payer: Self-pay

## 2022-07-14 ENCOUNTER — Other Ambulatory Visit (HOSPITAL_COMMUNITY): Payer: Self-pay | Admitting: Cardiology

## 2022-07-14 ENCOUNTER — Other Ambulatory Visit (HOSPITAL_COMMUNITY): Payer: Self-pay

## 2022-07-21 ENCOUNTER — Other Ambulatory Visit: Payer: Self-pay

## 2022-08-07 NOTE — Progress Notes (Signed)
ADVANCED HF CLINIC NOTE   PCP: Bing Neighbors, NP HF Cardiology: Dr. Shirlee Latch EP: Dr. Ladona Ridgel Nephrology: HD in [place], [place]  Travis Bryan is a 52 y.o. male with history of poorly controlled DM, atrial flutter and fibrillation, nonischemic cardiomyopathy, and ESRD presents for followup of CHF.  Cardiomyopathy has been known for a number of years. Cath in 6/06 showed no obstructive coronary disease.  He had ICD discharge for VT in 10/17 and again in 1/18, both times likely in the setting of CHF exacerbation.    He was admitted in 1/18 with exertional dyspnea/CHF exacerbation.  ICD had also discharged for VT.  He was noted to be in new atrial flutter.  He had a flutter ablation.  However, post-procedure, he developed AKI and cardiogenic shock.  Dobutamine and norepinephrine were required but eventually titrated off.  He developed atrial fibrillation subsequent to the atrial flutter ablation.  He had DCCV to NSR 04/11/16.  He is on amiodarone.   Echo 09/2016 EF 25-30%, moderate dilation, moderate LVH, mild MR, moderately decreased RV systolic function, PASP 51 mmHg.  Echo in 12/19 with EF 25-30%.    He progressed to ESRD in 2/20.   He went back into atrial fibrillation in 7/20 and failed DCCV, but converted to NSR on amiodarone.   Last seen in AHF clinic 12/22, unfortunately lost to f/u.   Patient follows up at [facility] kidney care. Has been on kidney transplant list but got wait listed d/t heart failure. Since then he was referred to Dr. Edwena Blow at [facility] by cardiology at [facility] for heart/kidney transplant.   Today he returns for AHF follow up. Overall feeling pretty good. Denies palpitations, CP, dizziness, edema, or PND/Orthopnea. SOB only sometimes when climbing stairs or with long walks (resolves with rest). Appetite good. No fever or chills. Does not weight at home. Taking all medications. Denies ETOH, smoking.  Volume is controlled well by HD M,W,F.  Lives in [place], [place], wants to  continue cardiology care in [place].  He gets HD in [place].  Plan for [facility] stay 6/24-6/28 for transplant workup.   Labs (2/18): hgb 14.6, K 4.9, creatinine 3.43 Labs 04/26/2016: K 4.1 Creatinine 2.79, TSH normal, LFTs normal, hgb 14.4 Labs (4/18): K 3.7, creatinine 2.61 Labs (6/18): LDL 167, HDL 49, hgb A1c 6 Labs (8/20): Low TSH, normal T3, elevated free T4.  Labs (10/20): LDL 86, HDL 61, elevated TSH, elevated free T4, normal free T3 Labs (8/22): LFTs normal Labs (5/24): K 5, SCr 16.6  PMH: 1. Chronic systolic CHF: Nonischemic cardiomyopathy.  St Jude ICD.  - LHC (6/06): Nonobstructive CAD.  - Echo (1/18): EF 20%, moderate RV dilation with moderate-severely decreased RV systolic function, PASP 48 mmHg.  - Cardiolite (1/18): EF 7%, large fixed inferolateral defect no ischemia.  - Echo (12/19): EF 25-30%.  2. Atrial flutter: s/p ablation 1/18.  3. Atrial fibrillation: Paroxysmal.  S/p DCCV 1/18.  4. ESRD 5. Type II diabetes: Poorly controlled over time.  6. VT: 10/17 and 1/18 with ICD discharge.    Review of systems complete and found to be negative unless listed in HPI.    Social History   Socioeconomic History   Marital status: Widowed    Spouse name: Not on file   Number of children: Not on file   Years of education: Not on file   Highest education level: Not on file  Occupational History   Not on file  Tobacco Use   Smoking status: Never  Smokeless tobacco: Never   Tobacco comments:    pt denies smoking cigars  Vaping Use   Vaping Use: Never used  Substance and Sexual Activity   Alcohol use: No   Drug use: No   Sexual activity: Not Currently  Other Topics Concern   Not on file  Social History Narrative   Not on file   Social Determinants of Health   Financial Resource Strain: Not on file  Food Insecurity: Not on file  Transportation Needs: Not on file  Physical Activity: Not on file  Stress: Not on file  Social Connections: Not on file   Family History   Problem Relation Age of Onset   Hypertension Mother    Heart disease Mother    Diabetes Mother    Review of systems complete and found to be negative unless listed in HPI.    Current Outpatient Medications  Medication Sig Dispense Refill   atorvastatin (LIPITOR) 40 MG tablet TAKE 1 TABLET (40 MG TOTAL) BY MOUTH ONCE DAILY AT 6 PM. 90 tablet 3   metoprolol succinate (TOPROL-XL) 25 MG 24 hr tablet Take 1 tablet (25 mg total) by mouth daily. (Patient taking differently: Take 25 mg by mouth daily. Patient takes 0.5 tablet by mouth daily.) 30 tablet 5   sevelamer carbonate (RENVELA) 800 MG tablet Take 2,400 mg by mouth with breakfast, with lunch, and with evening meal.     warfarin (COUMADIN) 5 MG tablet Take 1/2 to 1 tablet once daily as directed by [facility]madin Clinic 35 tablet 2   amiodarone (PACERONE) 200 MG tablet TAKE 1 TABLET BY MOUTH DAILY. (Patient not taking: Reported on 08/08/2022) 90 tablet 3   No current facility-administered medications for this encounter.   Vitals:   08/08/22 1152  BP: 90/64  Pulse: 84  SpO2: 91%  Weight: 113.9 kg (251 lb 3.2 oz)    Wt Readings from Last 3 Encounters:  08/08/22 113.9 kg (251 lb 3.2 oz)  02/22/21 115.6 kg (254 lb 12.8 oz)  02/15/21 113.2 kg (249 lb 9.6 oz)   Physical Exam General:  well appearing.  No respiratory difficulty. Walked into clinic.  HEENT: normal Neck: supple. JVD ~7 cm. Carotids 2+ bilat; no bruits. No lymphadenopathy or thyromegaly appreciated. Cor: PMI nondisplaced. Regular rate & rhythm. No rubs, gallops or murmurs. Lungs: clear Abdomen: soft, nontender, nondistended. No hepatosplenomegaly. No bruits or masses. Good bowel sounds. Extremities: no cyanosis, clubbing, rash, edema. LUE fistula +bruit/thrill Neuro: alert & oriented x 3, cranial nerves grossly intact. moves all 4 extremities w/o difficulty. Affect pleasant.   Device interrogation: no VT/VF (Personally reviewed)    EKG NSR 1st degree AVB LBBB 83 bpm  (Personally reviewed)    Assessment/Plan: 1. Chronic systolic CHF: Nonischemic cardiomyopathy.  St Jude ICD.  Echo 1/18 with EF 20% and moderate to severely decreased RV systolic function.  Echo 6/18 with EF 25-30%, moderate dilation, moderate LVH, mild MR, moderately decreased RV systolic function, PASP 51 mmHg. Echo in 12/19 with EF 25-30%. Echo 04/22 EF 20%, moderately dilated LV, mildly reduced RV  - Well-controlled at HD.  NYHA class II symptoms. Unlikely to tolerate additional cardiac meds due to soft BP.  - Continue Toprol XL 12.5 mg daily, recently cut back at [facility] 5/24 - Is now being worked up for heart/kidney transplant with Dr. Edwena Blow at [facility]. Has upcoming stay at [facility] 6/24-6/28 for further transplant workup including echo and L/RHC.  2. Atrial fibrillation: He is in NSR today. With low EF and  history of VT will restart amiodarone 200 mg daily.  Check LFTs.  He will need a regular eye exam.   - Continue warfarin for anticoagulation.  3. Diabetes: Per PCP.  - Continue statin, check lipids today.  4. VT: Has been out of amiodarone for 2-3 weeks. With history of VT and low EF will restart at 200 mg daily.  5. ESRD: Per nephrology. Gets HD M,W,F.   Follow-up in 3-4 months with Dr. Shirlee Latch.   Alen Bleacher, NP  08/08/2022

## 2022-08-08 ENCOUNTER — Encounter (HOSPITAL_COMMUNITY): Payer: Self-pay

## 2022-08-08 ENCOUNTER — Ambulatory Visit
Admission: RE | Admit: 2022-08-08 | Discharge: 2022-08-08 | Disposition: A | Discharge status: Home / Self Care | Payer: 59 | Source: Ambulatory Visit | Attending: Internal Medicine | Admitting: Internal Medicine

## 2022-08-08 ENCOUNTER — Ambulatory Visit (INDEPENDENT_AMBULATORY_CARE_PROVIDER_SITE_OTHER): Payer: 59

## 2022-08-08 VITALS — BP 90/64 | HR 84 | Wt 251.2 lb

## 2022-08-08 DIAGNOSIS — Z7901 Long term (current) use of anticoagulants: Secondary | ICD-10-CM | POA: Insufficient documentation

## 2022-08-08 DIAGNOSIS — I428 Other cardiomyopathies: Secondary | ICD-10-CM | POA: Insufficient documentation

## 2022-08-08 DIAGNOSIS — I5022 Chronic systolic (congestive) heart failure: Secondary | ICD-10-CM | POA: Insufficient documentation

## 2022-08-08 DIAGNOSIS — I480 Paroxysmal atrial fibrillation: Secondary | ICD-10-CM | POA: Insufficient documentation

## 2022-08-08 DIAGNOSIS — Z95810 Presence of automatic (implantable) cardiac defibrillator: Secondary | ICD-10-CM | POA: Diagnosis not present

## 2022-08-08 DIAGNOSIS — I4819 Other persistent atrial fibrillation: Secondary | ICD-10-CM

## 2022-08-08 DIAGNOSIS — R0602 Shortness of breath: Secondary | ICD-10-CM | POA: Diagnosis present

## 2022-08-08 DIAGNOSIS — Z992 Dependence on renal dialysis: Secondary | ICD-10-CM | POA: Insufficient documentation

## 2022-08-08 DIAGNOSIS — E1122 Type 2 diabetes mellitus with diabetic chronic kidney disease: Secondary | ICD-10-CM | POA: Insufficient documentation

## 2022-08-08 DIAGNOSIS — I472 Ventricular tachycardia, unspecified: Secondary | ICD-10-CM

## 2022-08-08 DIAGNOSIS — N186 End stage renal disease: Secondary | ICD-10-CM | POA: Insufficient documentation

## 2022-08-08 DIAGNOSIS — E1169 Type 2 diabetes mellitus with other specified complication: Secondary | ICD-10-CM | POA: Diagnosis not present

## 2022-08-08 DIAGNOSIS — Z79899 Other long term (current) drug therapy: Secondary | ICD-10-CM | POA: Diagnosis not present

## 2022-08-08 LAB — HEPATIC FUNCTION PANEL
ALT: 23 U/L (ref 0–44)
AST: 20 U/L (ref 15–41)
Albumin: 3.9 g/dL (ref 3.5–5.0)
Alkaline Phosphatase: 106 U/L (ref 38–126)
Bilirubin, Direct: 0.2 mg/dL (ref 0.0–0.2)
Indirect Bilirubin: 0.8 mg/dL (ref 0.3–0.9)
Total Bilirubin: 1 mg/dL (ref 0.3–1.2)
Total Protein: 8.1 g/dL (ref 6.5–8.1)

## 2022-08-08 LAB — LIPID PANEL
Cholesterol: 161 mg/dL (ref 0–200)
HDL: 41 mg/dL (ref >40)
LDL Cholesterol: 53 mg/dL (ref 0–99)
Total CHOL/HDL Ratio: 3.9 RATIO
Triglycerides: 334 mg/dL — ABNORMAL HIGH (ref <150)
VLDL: 67 mg/dL — ABNORMAL HIGH (ref 0–40)

## 2022-08-08 LAB — POCT INR: INR: 2.2 (ref 2.0–3.0)

## 2022-08-08 MED ORDER — AMIODARONE HCL 200 MG PO TABS: ORAL_TABLET | Freq: Every day | ORAL | 3 refills | Status: AC

## 2022-08-08 NOTE — Patient Instructions (Signed)
Description   Continue taking Warfarin 1 tablet daily except 1/2 tablet on Wednesdays.  Recheck INR in 6 weeks. Please call [facility] Clinic with any changes in medications. [phone] OR [phone]     

## 2022-08-08 NOTE — Patient Instructions (Addendum)
Thank you for coming in today  If you had labs drawn today, any labs that are abnormal the clinic will call you No news is good news  Medications:  RESTART Amiodarone 200 mg 1 tablet daily    Follow up appointments:  Your physician recommends that you schedule a follow-up appointment in:  3 months with Dr. Shirlee Latch   Do the following things EVERYDAY: Weigh yourself in the morning before breakfast. Write it down and keep it in a log. Take your medicines as prescribed Eat low salt foods--Limit salt (sodium) to 2000 mg per day.  Stay as active as you can everyday Limit all fluids for the day to less than 2 liters   At the [facility], you and your health needs are our priority. As part of our continuing mission to provide you with exceptional heart care, we have created designated Provider Care Teams. These Care Teams include your primary Cardiologist (physician) and Advanced Practice Providers (APPs- Physician Assistants and Nurse Practitioners) who all work together to provide you with the care you need, when you need it.   You may see any of the following providers on your designated Care Team at your next follow up: Dr Arvilla Meres Dr Marca Ancona Dr. Marcos Eke, NP Robbie Lis, [facility], [facility], [place] Brynda Peon, NP Karle Plumber, PharmD   Please be sure to bring in all your medications bottles to every appointment.    Thank you for choosing [facility]  If you have any questions or concerns before your next appointment please send [place] a message through [place] or call our office at [phone].    TO LEAVE A MESSAGE FOR THE NURSE SELECT OPTION 2, PLEASE LEAVE A MESSAGE INCLUDING: YOUR NAME DATE OF BIRTH CALL BACK NUMBER REASON FOR CALL**this is important as we prioritize the call backs  YOU WILL RECEIVE A CALL BACK THE SAME DAY AS LONG AS YOU CALL BEFORE 4:00 PM

## 2022-08-10 ENCOUNTER — Ambulatory Visit (INDEPENDENT_AMBULATORY_CARE_PROVIDER_SITE_OTHER): Payer: 59

## 2022-08-10 DIAGNOSIS — I428 Other cardiomyopathies: Secondary | ICD-10-CM | POA: Diagnosis not present

## 2022-08-10 LAB — CUP PACEART REMOTE DEVICE CHECK
Battery Remaining Longevity: 19 mo
Battery Remaining Percentage: 19 %
Battery Voltage: 2.77 V
Brady Statistic RV Percent Paced: 1 %
Date Time Interrogation Session: 20240530020018
HighPow Impedance: 56 Ohm
HighPow Impedance: 56 Ohm
Implantable Lead Connection Status: 753985
Implantable Lead Implant Date: 20090211
Implantable Lead Location: 753860
Implantable Lead Model: 7121
Implantable Pulse Generator Implant Date: 20150617
Lead Channel Impedance Value: 390 Ohm
Lead Channel Pacing Threshold Amplitude: 1.25 V
Lead Channel Pacing Threshold Pulse Width: 0.7 ms
Lead Channel Sensing Intrinsic Amplitude: 7.4 mV
Lead Channel Setting Pacing Amplitude: 2.5 V
Lead Channel Setting Pacing Pulse Width: 0.7 ms
Lead Channel Setting Sensing Sensitivity: 0.5 mV
Pulse Gen Serial Number: 7200640

## 2022-08-23 ENCOUNTER — Other Ambulatory Visit (HOSPITAL_COMMUNITY): Payer: Self-pay | Admitting: Internal Medicine

## 2022-08-23 ENCOUNTER — Other Ambulatory Visit: Payer: Self-pay

## 2022-08-23 ENCOUNTER — Other Ambulatory Visit (HOSPITAL_COMMUNITY): Payer: Self-pay

## 2022-08-23 ENCOUNTER — Other Ambulatory Visit: Payer: Self-pay | Admitting: Internal Medicine

## 2022-08-23 DIAGNOSIS — E1169 Type 2 diabetes mellitus with other specified complication: Secondary | ICD-10-CM

## 2022-08-23 MED ORDER — METOPROLOL SUCCINATE ER 25 MG PO TB24
25.0000 mg | ORAL_TABLET | Freq: Every day | ORAL | 5 refills | Status: AC
  Filled 2022-08-23 (×2): qty 30, 30d supply, fill #0
  Filled 2022-11-14: qty 30, 30d supply, fill #1
  Filled 2023-01-05: qty 30, 30d supply, fill #2
  Filled 2023-02-15: qty 30, 30d supply, fill #3

## 2022-08-23 MED ORDER — ATORVASTATIN CALCIUM 40 MG PO TABS
ORAL_TABLET | Freq: Every day | ORAL | 3 refills | Status: AC
Start: 2022-08-23 — End: ?
  Filled 2022-08-23: qty 90, fill #0
  Filled 2022-08-23: qty 90, 90d supply, fill #0
  Filled 2022-11-14: qty 90, 90d supply, fill #1
  Filled 2023-02-15 (×2): qty 90, 90d supply, fill #2

## 2022-08-29 NOTE — Progress Notes (Signed)
Remote ICD transmission.   

## 2022-09-19 ENCOUNTER — Ambulatory Visit: Payer: 59

## 2022-09-28 ENCOUNTER — Ambulatory Visit: Payer: 59 | Attending: Cardiology

## 2022-09-28 DIAGNOSIS — Z95810 Presence of automatic (implantable) cardiac defibrillator: Secondary | ICD-10-CM | POA: Diagnosis not present

## 2022-09-28 DIAGNOSIS — I4819 Other persistent atrial fibrillation: Secondary | ICD-10-CM

## 2022-09-28 LAB — POCT INR: INR: 2.4 (ref 2.0–3.0)

## 2022-09-28 NOTE — Patient Instructions (Signed)
Description   Continue taking Warfarin 1 tablet daily except 1/2 tablet on Wednesdays.  Recheck INR in 6 weeks. Please call [facility] Clinic with any changes in medications. [phone] OR [phone]

## 2022-10-02 ENCOUNTER — Other Ambulatory Visit: Payer: Self-pay

## 2022-10-02 DIAGNOSIS — I483 Typical atrial flutter: Secondary | ICD-10-CM

## 2022-10-02 DIAGNOSIS — Z5181 Encounter for therapeutic drug level monitoring: Secondary | ICD-10-CM

## 2022-10-02 DIAGNOSIS — Z95810 Presence of automatic (implantable) cardiac defibrillator: Secondary | ICD-10-CM

## 2022-10-02 DIAGNOSIS — I4819 Other persistent atrial fibrillation: Secondary | ICD-10-CM

## 2022-10-02 MED ORDER — WARFARIN SODIUM 5 MG PO TABS
ORAL_TABLET | ORAL | 2 refills | Status: DC
Start: 2022-10-02 — End: 2023-01-23

## 2022-11-09 ENCOUNTER — Encounter (HOSPITAL_COMMUNITY): Payer: Self-pay | Admitting: Cardiology

## 2022-11-09 ENCOUNTER — Ambulatory Visit (INDEPENDENT_AMBULATORY_CARE_PROVIDER_SITE_OTHER): Payer: 59

## 2022-11-09 ENCOUNTER — Ambulatory Visit
Admission: RE | Admit: 2022-11-09 | Discharge: 2022-11-09 | Disposition: A | Discharge status: Home / Self Care | Payer: 59 | Source: Ambulatory Visit | Attending: Cardiology | Admitting: Cardiology

## 2022-11-09 VITALS — BP 106/74 | HR 87 | Ht 70.0 in | Wt 249.2 lb

## 2022-11-09 DIAGNOSIS — I428 Other cardiomyopathies: Secondary | ICD-10-CM

## 2022-11-09 DIAGNOSIS — I472 Ventricular tachycardia, unspecified: Secondary | ICD-10-CM | POA: Insufficient documentation

## 2022-11-09 DIAGNOSIS — Z95810 Presence of automatic (implantable) cardiac defibrillator: Secondary | ICD-10-CM

## 2022-11-09 DIAGNOSIS — I4819 Other persistent atrial fibrillation: Secondary | ICD-10-CM | POA: Diagnosis not present

## 2022-11-09 DIAGNOSIS — I5022 Chronic systolic (congestive) heart failure: Secondary | ICD-10-CM | POA: Diagnosis not present

## 2022-11-09 DIAGNOSIS — I5020 Unspecified systolic (congestive) heart failure: Secondary | ICD-10-CM | POA: Diagnosis not present

## 2022-11-09 DIAGNOSIS — R9431 Abnormal electrocardiogram [ECG] [EKG]: Secondary | ICD-10-CM | POA: Diagnosis not present

## 2022-11-09 DIAGNOSIS — I480 Paroxysmal atrial fibrillation: Secondary | ICD-10-CM | POA: Insufficient documentation

## 2022-11-09 DIAGNOSIS — N186 End stage renal disease: Secondary | ICD-10-CM | POA: Diagnosis not present

## 2022-11-09 DIAGNOSIS — E1122 Type 2 diabetes mellitus with diabetic chronic kidney disease: Secondary | ICD-10-CM | POA: Insufficient documentation

## 2022-11-09 DIAGNOSIS — Z7901 Long term (current) use of anticoagulants: Secondary | ICD-10-CM | POA: Diagnosis not present

## 2022-11-09 DIAGNOSIS — Z992 Dependence on renal dialysis: Secondary | ICD-10-CM

## 2022-11-09 DIAGNOSIS — E1165 Type 2 diabetes mellitus with hyperglycemia: Secondary | ICD-10-CM | POA: Diagnosis present

## 2022-11-09 LAB — COMPREHENSIVE METABOLIC PANEL
ALT: 18 U/L (ref 0–44)
AST: 14 U/L — ABNORMAL LOW (ref 15–41)
Albumin: 3.7 g/dL (ref 3.5–5.0)
Alkaline Phosphatase: 96 U/L (ref 38–126)
Anion gap: 16 — ABNORMAL HIGH (ref 5–15)
BUN: 39 mg/dL — ABNORMAL HIGH (ref 6–20)
CO2: 29 mmol/L (ref 22–32)
Calcium: 10.3 mg/dL (ref 8.9–10.3)
Chloride: 91 mmol/L — ABNORMAL LOW (ref 98–111)
Creatinine, Ser: 13.49 mg/dL — ABNORMAL HIGH (ref 0.61–1.24)
GFR, Estimated: 4 mL/min — ABNORMAL LOW (ref >60)
Glucose, Bld: 100 mg/dL — ABNORMAL HIGH (ref 70–99)
Potassium: 4.3 mmol/L (ref 3.5–5.1)
Sodium: 136 mmol/L (ref 135–145)
Total Bilirubin: 1.4 mg/dL — ABNORMAL HIGH (ref 0.3–1.2)
Total Protein: 7.9 g/dL (ref 6.5–8.1)

## 2022-11-09 LAB — CBC
HCT: 33 % — ABNORMAL LOW (ref 39.0–52.0)
Hemoglobin: 10.5 g/dL — ABNORMAL LOW (ref 13.0–17.0)
MCH: 30.9 pg (ref 26.0–34.0)
MCHC: 31.8 g/dL (ref 30.0–36.0)
MCV: 97.1 fL (ref 80.0–100.0)
Platelets: 212 10*3/uL (ref 150–400)
RBC: 3.4 MIL/uL — ABNORMAL LOW (ref 4.22–5.81)
RDW: 15.1 % (ref 11.5–15.5)
WBC: 9 10*3/uL (ref 4.0–10.5)
nRBC: 0 % (ref 0.0–0.2)

## 2022-11-09 LAB — LIPID PANEL
Cholesterol: 130 mg/dL (ref 0–200)
HDL: 44 mg/dL (ref >40)
LDL Cholesterol: 54 mg/dL (ref 0–99)
Total CHOL/HDL Ratio: 3 RATIO
Triglycerides: 158 mg/dL — ABNORMAL HIGH (ref <150)
VLDL: 32 mg/dL (ref 0–40)

## 2022-11-09 LAB — POCT INR: INR: 2.5 (ref 2.0–3.0)

## 2022-11-09 LAB — TSH: TSH: 5.306 u[IU]/mL — ABNORMAL HIGH (ref 0.350–4.500)

## 2022-11-09 NOTE — Patient Instructions (Signed)
Description   Continue taking Warfarin 1 tablet daily except 1/2 tablet on Wednesdays.  Recheck INR in 6 weeks. Please call [facility] Clinic with any changes in medications. [phone] OR [phone]

## 2022-11-09 NOTE — Progress Notes (Signed)
PCP: Bing Neighbors, NP HF Cardiology: Dr. Shirlee Latch EP: Dr. Ladona Ridgel Nephrology: HD in [place], [place]  Travis Bryan is a 52 y.o. male with history of poorly controlled DM, atrial flutter and fibrillation, nonischemic cardiomyopathy, and ESRD presents for followup of CHF.  Cardiomyopathy has been known for a number of years. Cath in 6/06 showed no obstructive coronary disease.  He had ICD discharge for VT in 10/17 and again in 1/18, both times likely in the setting of CHF exacerbation.    He was admitted in 1/18 with exertional dyspnea/CHF exacerbation.  ICD had also discharged for VT.  He was noted to be in new atrial flutter.  He had a flutter ablation.  However, post-procedure, he developed AKI and cardiogenic shock.  Dobutamine and norepinephrine were required but eventually titrated off.  He developed atrial fibrillation subsequent to the atrial flutter ablation.  He had DCCV to NSR 04/11/16.  He is on amiodarone.   Echo 09/2016 EF 25-30%, moderate dilation, moderate LVH, mild MR, moderately decreased RV systolic function, PASP 51 mmHg.  Echo in 12/19 with EF 25-30%.    He progressed to ESRD in 2/20.   He went back into atrial fibrillation in 7/20 and failed DCCV, but converted to NSR on amiodarone.   Most recent echo in 8/24 showed EF 20% with diffuse hypokinesis, normal RV.   He is now living in [place], [place] but wants to continue cardiology care in [place].  He gets HD in [place].  He is in NSR today, denies palpitations.  He is being evaluated for heart/kidney transplant at [facility] by Dr. Edwena Blow.  He reports dyspnea with "long walks" but does ok around the house.  Can climb a flight of stairs without difficulty.    ECG (personally reviewed): NSR, 1st degree AVB, LBBB 172 msec  Labs (2/18): hgb 14.6, K 4.9, creatinine 3.43 Labs 04/26/2016: K 4.1 Creatinine 2.79, TSH normal, LFTs normal, hgb 14.4 Labs (4/18): K 3.7, creatinine 2.61 Labs (6/18): LDL 167, HDL 49, hgb A1c 6 Labs (8/20):  Low TSH, normal T3, elevated free T4.  Labs (10/20): LDL 86, HDL 61, elevated TSH, elevated free T4, normal free T3 Labs (8/22): LFTs normal  St Jude device interrogation: Stable thoracic impedance, no VT  PMH: 1. Chronic systolic CHF: Nonischemic cardiomyopathy.  St Jude ICD.  - LHC (6/06): Nonobstructive CAD.  - Echo (1/18): EF 20%, moderate RV dilation with moderate-severely decreased RV systolic function, PASP 48 mmHg.  - Cardiolite (1/18): EF 7%, large fixed inferolateral defect no ischemia.  - Echo (12/19): EF 25-30%.  - Echo (8/24): EF 20% with diffuse hypokinesis, normal RV.  2. Atrial flutter: s/p ablation 1/18.  3. Atrial fibrillation: Paroxysmal.  S/p DCCV 1/18.  4. ESRD 5. Type II diabetes: Poorly controlled over time.  6. VT: 10/17 and 1/18 with ICD discharge.    Review of systems complete and found to be negative unless listed in HPI.    Social History   Socioeconomic History   Marital status: Widowed    Spouse name: Not on file   Number of children: Not on file   Years of education: Not on file   Highest education level: Not on file  Occupational History   Not on file  Tobacco Use   Smoking status: Never   Smokeless tobacco: Never   Tobacco comments:    pt denies smoking cigars  Vaping Use   Vaping status: Never Used  Substance and Sexual Activity   Alcohol use: No  Drug use: No   Sexual activity: Not Currently  Other Topics Concern   Not on file  Social History Narrative   Not on file   Social Determinants of Health   Financial Resource Strain: Low Risk  (10/24/2022)   Received from [facility]   Overall Financial Resource Strain (CARDIA)    Difficulty of Paying Living Expenses: Not hard at all  Food Insecurity: No Food Insecurity (10/24/2022)   Received from [facility]   Hunger Vital Sign    Worried About Running Out of Food in the Last Year: Never true    Ran Out of Food in the Last Year: Never true   Recent Concern: Food Insecurity - Food Insecurity Present (10/20/2022)   Received from [facility]   Hunger Vital Sign    Worried About Running Out of Food in the Last Year: Sometimes true    Ran Out of Food in the Last Year: Sometimes true  Transportation Needs: No Transportation Needs (10/24/2022)   Received from [facility] - Transportation    In the past 12 months, has lack of transportation kept you from medical appointments or from getting medications?: No    Lack of Transportation (Non-Medical): No  Physical Activity: Not on file  Stress: Not on file  Social Connections: Unknown (11/20/2018)   [facility]   Social Connection and Isolation Panel [NHANES]    Frequency of Communication with Friends and Family: More than three times a week    Frequency of Social Gatherings with Friends and Family: More than three times a week    Attends Religious Services: Patient declined    Database administrator or Organizations: Patient declined    Attends Banker Meetings: Patient declined    Marital Status: Widowed   Family History  Problem Relation Age of Onset   Hypertension Mother    Heart disease Mother    Diabetes Mother    Review of systems complete and found to be negative unless listed in HPI.    Current Outpatient Medications  Medication Sig Dispense Refill   amiodarone (PACERONE) 200 MG tablet TAKE 1 TABLET BY MOUTH DAILY. 90 tablet 3   atorvastatin (LIPITOR) 40 MG tablet TAKE 1 TABLET (40 MG TOTAL) BY MOUTH ONCE DAILY AT 6 PM. 90 tablet 3   metoprolol succinate (TOPROL-XL) 25 MG 24 hr tablet Take 1 tablet (25 mg total) by mouth daily. (Patient taking differently: Take 25 mg by mouth daily. Pt taking 1/2 tablet as instructed by [facility] MD in June) 30 tablet 5   sevelamer carbonate (RENVELA) 800 MG tablet Take 2,400 mg by mouth with breakfast, with lunch, and with evening meal.      warfarin (COUMADIN) 5 MG tablet Take 1/2 to 1 tablet once daily as directed by [facility] 35 tablet 2   No current facility-administered medications for this encounter.   Vitals:   11/09/22 1339  BP: 106/74  Pulse: 87  SpO2: 93%  Weight: 113 kg (249 lb 3.2 oz)  Height: 5\' 10"  (1.778 m)   Wt Readings from Last 3 Encounters:  11/09/22 113 kg (249 lb 3.2 oz)  08/08/22 113.9 kg (251 lb 3.2 oz)  02/22/21 115.6 kg (254 lb 12.8 oz)   Physical Exam General: NAD Neck: No JVD, no thyromegaly or thyroid nodule.  Lungs: Clear to auscultation bilaterally with normal respiratory effort. CV: Nondisplaced PMI.  Heart regular S1/S2,  no S3/S4, no murmur.  No peripheral edema.  No carotid bruit.  Normal pedal pulses.  Abdomen: Soft, nontender, no hepatosplenomegaly, no distention.  Skin: Intact without lesions or rashes.  Neurologic: Alert and oriented x 3.  Psych: Normal affect. Extremities: No clubbing or cyanosis.  HEENT: Normal.   Assessment/Plan: 1. Chronic systolic CHF: Nonischemic cardiomyopathy.  St Jude ICD.  Echo 1/18 with EF 20% and moderate to severely decreased RV systolic function.  Echo 6/18 with EF 25-30%, moderate dilation, moderate LVH, mild MR, moderately decreased RV systolic function, PASP 51 mmHg. Echo in 12/19 with EF 25-30%.  Echo 8/24 showed EF 20% with diffuse hypokinesis, normal RV.  Volume is well-controlled at HD.  NYHA class II symptoms. BP runs on the lower side, unlikely to tolerate additional GDMT with dialysis.   - Continue Toprol XL 12.5 mg daily, will not increase with soft BP at HD.   - Patient has wider LBBB then when I last saw him, QRS 172 msec.  Would like CRT upgrade consideration for him, understanding increased infection risk with procedure due to ESRD.  I will have him see Dr. Ladona Ridgel to think about this.  - Getting worked up at [facility] for possible heart/kidney transplant.  -2. Atrial fibrillation: He is in NSR today on amiodarone.  - Continue amiodarone  200 mg daily.  Check LFTs, TSH.  He will need a regular eye exam (discussed).   - Continue warfarin for anticoagulation.  3. Diabetes: Per PCP.  - Continue statin, check lipids today.  4. VT: Now on amiodarone.  5. ESRD: Per nephrology.   Followup in 6 months, will refer to EP to consider CRT upgrade.   Marca Ancona, MD  11/09/2022

## 2022-11-09 NOTE — Patient Instructions (Signed)
No change in medications. Labs today - will call you if abnormal. Referral sent to Dr. Ladona Ridgel (EP) for evaluation for possible BIV ICD upgrade. Make sure get an eye exam once yearly (you are on Amiodarone which can affect your vision). Return to [facility] Failure [facility] in 6 months.

## 2022-11-10 LAB — CUP PACEART REMOTE DEVICE CHECK
Battery Remaining Longevity: 17 mo
Battery Remaining Percentage: 16 %
Battery Voltage: 2.74 V
Brady Statistic RV Percent Paced: 1 %
Date Time Interrogation Session: 20240829234931
HighPow Impedance: 56 Ohm
HighPow Impedance: 56 Ohm
Implantable Lead Connection Status: 753985
Implantable Lead Implant Date: 20090211
Implantable Lead Location: 753860
Implantable Lead Model: 7121
Implantable Pulse Generator Implant Date: 20150617
Lead Channel Impedance Value: 410 Ohm
Lead Channel Pacing Threshold Amplitude: 1.25 V
Lead Channel Pacing Threshold Pulse Width: 0.7 ms
Lead Channel Sensing Intrinsic Amplitude: 8 mV
Lead Channel Setting Pacing Amplitude: 2.5 V
Lead Channel Setting Pacing Pulse Width: 0.7 ms
Lead Channel Setting Sensing Sensitivity: 0.5 mV
Pulse Gen Serial Number: 7200640

## 2022-11-14 ENCOUNTER — Other Ambulatory Visit: Payer: Self-pay

## 2022-11-14 ENCOUNTER — Ambulatory Visit: Payer: 59

## 2022-11-14 ENCOUNTER — Encounter (HOSPITAL_COMMUNITY): Payer: 59 | Admitting: Cardiology

## 2022-11-16 NOTE — Progress Notes (Signed)
Remote ICD transmission.   

## 2022-11-23 ENCOUNTER — Encounter: Payer: Self-pay | Admitting: Cardiology

## 2022-12-21 ENCOUNTER — Ambulatory Visit: Payer: 59

## 2023-01-02 ENCOUNTER — Ambulatory Visit: Payer: 59 | Attending: Cardiology

## 2023-01-02 DIAGNOSIS — Z5181 Encounter for therapeutic drug level monitoring: Secondary | ICD-10-CM | POA: Diagnosis not present

## 2023-01-02 DIAGNOSIS — Z95810 Presence of automatic (implantable) cardiac defibrillator: Secondary | ICD-10-CM | POA: Diagnosis not present

## 2023-01-02 DIAGNOSIS — I4819 Other persistent atrial fibrillation: Secondary | ICD-10-CM

## 2023-01-02 LAB — POCT INR: INR: 2.9 (ref 2.0–3.0)

## 2023-01-02 NOTE — Patient Instructions (Signed)
Continue taking Warfarin 1 tablet daily except 1/2 tablet on Wednesdays.  Recheck INR in 6 weeks.  Please call Coumadin Clinic with any changes in medications. [phone] OR [phone]

## 2023-01-05 ENCOUNTER — Other Ambulatory Visit: Payer: Self-pay

## 2023-01-05 ENCOUNTER — Other Ambulatory Visit (HOSPITAL_COMMUNITY): Payer: Self-pay

## 2023-01-23 ENCOUNTER — Other Ambulatory Visit: Payer: Self-pay | Admitting: *Deleted

## 2023-01-23 DIAGNOSIS — I4819 Other persistent atrial fibrillation: Secondary | ICD-10-CM

## 2023-01-23 DIAGNOSIS — Z95810 Presence of automatic (implantable) cardiac defibrillator: Secondary | ICD-10-CM

## 2023-01-23 DIAGNOSIS — Z5181 Encounter for therapeutic drug level monitoring: Secondary | ICD-10-CM

## 2023-01-23 DIAGNOSIS — I483 Typical atrial flutter: Secondary | ICD-10-CM

## 2023-01-23 MED ORDER — WARFARIN SODIUM 5 MG PO TABS: ORAL_TABLET | ORAL | 3 refills | Status: AC

## 2023-01-23 NOTE — Telephone Encounter (Signed)
Warfarin 5mg  refill  Afib Last INR 01/02/23 Last OV 11/09/22

## 2023-02-10 LAB — CUP PACEART REMOTE DEVICE CHECK
Battery Remaining Longevity: 13 mo
Battery Remaining Percentage: 13 %
Battery Voltage: 2.71 V
Brady Statistic RV Percent Paced: 1 %
Date Time Interrogation Session: 20241128031902
HighPow Impedance: 70 Ohm
HighPow Impedance: 71 Ohm
Implantable Lead Connection Status: 753985
Implantable Lead Implant Date: 20090211
Implantable Lead Location: 753860
Implantable Lead Model: 7121
Implantable Pulse Generator Implant Date: 20150617
Lead Channel Impedance Value: 440 Ohm
Lead Channel Pacing Threshold Amplitude: 1.25 V
Lead Channel Pacing Threshold Pulse Width: 0.7 ms
Lead Channel Sensing Intrinsic Amplitude: 7.8 mV
Lead Channel Setting Pacing Amplitude: 2.5 V
Lead Channel Setting Pacing Pulse Width: 0.7 ms
Lead Channel Setting Sensing Sensitivity: 0.5 mV
Pulse Gen Serial Number: 7200640

## 2023-02-13 ENCOUNTER — Ambulatory Visit: Payer: 59

## 2023-02-13 ENCOUNTER — Ambulatory Visit (INDEPENDENT_AMBULATORY_CARE_PROVIDER_SITE_OTHER): Payer: 59

## 2023-02-13 DIAGNOSIS — I428 Other cardiomyopathies: Secondary | ICD-10-CM

## 2023-02-15 ENCOUNTER — Other Ambulatory Visit: Payer: Self-pay

## 2023-02-15 ENCOUNTER — Other Ambulatory Visit (HOSPITAL_COMMUNITY): Payer: Self-pay

## 2023-02-20 ENCOUNTER — Ambulatory Visit (INDEPENDENT_AMBULATORY_CARE_PROVIDER_SITE_OTHER): Payer: 59 | Admitting: *Deleted

## 2023-02-20 ENCOUNTER — Ambulatory Visit: Payer: 59 | Attending: Internal Medicine | Admitting: Internal Medicine

## 2023-02-20 ENCOUNTER — Encounter: Payer: Self-pay | Admitting: Internal Medicine

## 2023-02-20 VITALS — BP 80/56 | HR 99 | Ht 70.0 in | Wt 248.6 lb

## 2023-02-20 DIAGNOSIS — I4819 Other persistent atrial fibrillation: Secondary | ICD-10-CM

## 2023-02-20 DIAGNOSIS — Z95810 Presence of automatic (implantable) cardiac defibrillator: Secondary | ICD-10-CM | POA: Diagnosis not present

## 2023-02-20 LAB — CUP PACEART INCLINIC DEVICE CHECK
Battery Remaining Longevity: 14 mo
Brady Statistic RV Percent Paced: 0.03 %
Date Time Interrogation Session: 20241210084810
HighPow Impedance: 69 Ohm
HighPow Impedance: 69.4224
Implantable Lead Connection Status: 753985
Implantable Lead Implant Date: 20090211
Implantable Lead Location: 753860
Implantable Lead Model: 7121
Implantable Pulse Generator Implant Date: 20150617
Lead Channel Impedance Value: 450 Ohm
Lead Channel Pacing Threshold Amplitude: 1 V
Lead Channel Pacing Threshold Amplitude: 1 V
Lead Channel Pacing Threshold Pulse Width: 0.7 ms
Lead Channel Pacing Threshold Pulse Width: 0.7 ms
Lead Channel Sensing Intrinsic Amplitude: 10.3 mV
Lead Channel Setting Pacing Amplitude: 2.5 V
Lead Channel Setting Pacing Pulse Width: 0.7 ms
Lead Channel Setting Sensing Sensitivity: 0.5 mV
Pulse Gen Serial Number: 7200640

## 2023-02-20 LAB — POCT INR: INR: 2.9 (ref 2.0–3.0)

## 2023-02-20 NOTE — Patient Instructions (Signed)
 Medication Instructions:  Your physician recommends that you continue on your current medications as directed. Please refer to the Current Medication list given to you today.  *If you need a refill on your cardiac medications before your next appointment, please call your pharmacy*  Lab Work: None ordered.  If you have labs (blood work) drawn today and your tests are completely normal, you will receive your results only by: MyChart Message (if you have MyChart) OR A paper copy in the mail If you have any lab test that is abnormal or we need to change your treatment, we will call you to review the results.  Testing/Procedures: None ordered.  Follow-Up: At [facility], you and your health needs are our priority.  As part of our continuing mission to provide you with exceptional heart care, we have created designated Provider Care Teams.  These Care Teams include your primary Cardiologist (physician) and Advanced Practice Providers (APPs -  Physician Assistants and Nurse Practitioners) who all work together to provide you with the care you need, when you need it.  We recommend signing up for the patient portal called "MyChart".  Sign up information is provided on this After Visit Summary.  MyChart is used to connect with patients for Virtual Visits (Telemedicine).  Patients are able to view lab/test results, encounter notes, upcoming appointments, etc.  Non-urgent messages can be sent to your provider as well.   To learn more about what you can do with MyChart, go to [email or web address].    Your next appointment:   1 year(s)  The format for your next appointment:   In Person  Provider:   Lewayne Bunting, MD{or one of the following Advanced Practice Providers on your designated Care Team:   Francis Dowse, [place] Casimiro Needle "Mardelle Matte" [place], [place] Earnest Rosier, NP  Remote monitoring is used to monitor your Pacemaker/ ICD from home. This monitoring reduces the number of office visits required  to check your device to one time per year. It allows [place] to keep an eye on the functioning of your device to ensure it is working properly.   Important Information About Sugar

## 2023-02-20 NOTE — Progress Notes (Signed)
HPI [name] returns today for followup. He is a very pleasant 52 year old man with chronic systolic heart failure, uncontrolled hypertension, diabetes, status post ICD implantation initially in 2009 with a gen change in 2015. In the interim, he has been in the hospital with decompensated CHF associated with atrial flutter, s/p ablation and atrial fib, s/p amiodarone therapy.  He is referred back today as he has been noted to have an increase in his QRS duration from 120 to 172 with an IVCD. The patient's CHF was initially improved under careful followup in our CHF clinic and initiation of HD due to ESRD. He is being evaluated for heart/kidney transplant. He states that he will be on the list when his weight gets below 235.  No Known Allergies   Current Outpatient Medications  Medication Sig Dispense Refill   amiodarone (PACERONE) 200 MG tablet TAKE 1 TABLET BY MOUTH DAILY. 90 tablet 3   atorvastatin (LIPITOR) 40 MG tablet TAKE 1 TABLET (40 MG TOTAL) BY MOUTH ONCE DAILY AT 6 PM. 90 tablet 3   metoprolol succinate (TOPROL-XL) 25 MG 24 hr tablet Take 1 tablet (25 mg total) by mouth daily. (Patient taking differently: Take 25 mg by mouth daily. Pt taking 1/2 tablet as instructed by [facility] MD in June) 30 tablet 5   sevelamer carbonate (RENVELA) 800 MG tablet Take 2,400 mg by mouth with breakfast, with lunch, and with evening meal.     warfarin (COUMADIN) 5 MG tablet Take 1/2 to 1 tablet once daily as directed by Coumadin Clinic 35 tablet 3   No current facility-administered medications for this visit.     Past Medical History:  Diagnosis Date   AICD (automatic cardioverter/defibrillator) present    Atrial flutter (HCC)    a. s/p ablation 03/2016   Chronic kidney disease    Chronic systolic CHF (congestive heart failure) (HCC)    HCAP (healthcare-associated pneumonia) 03/28/2018   History of gout    Hyperlipidemia    Hypertension    Myocardial infarction (HCC)    "I think I had one a  long long time ago" (03/28/2016)   NICM (nonischemic cardiomyopathy) (HCC)    a. LHC 6/06: pLAD 20, pLCx 20-30; b. Echo 5/15:  EF 15%, diffuse HK, restrictive physiology, trivial AI, trivial MR, mild LAE, moderate RVE, moderately reduced RVSF, moderate RAE, mild to moderate TR, PASP 43 mmHg   Obesity    Persistent atrial fibrillation (HCC)    Stroke (HCC)    Type II diabetes mellitus (HCC)     ROS:   All systems reviewed and negative except as noted in the HPI.   Past Surgical History:  Procedure Laterality Date   AV FISTULA PLACEMENT Left 04/25/2018   Procedure: ARTERIOVENOUS (AV) VS GRAFT ARM;  Surgeon: Nada Libman, MD;  Location: [facility] OR;  Service: Vascular;  Laterality: Left;   BASCILIC VEIN TRANSPOSITION Left 07/19/2018   Procedure: SECOND STAGE BASILIC VEIN TRANSPOSITION LEFT ARM;  Surgeon: Nada Libman, MD;  Location: [facility] OR;  Service: Vascular;  Laterality: Left;   CARDIOVERSION N/A 04/07/2016   Procedure: CARDIOVERSION;  Surgeon: Vesta Mixer, MD;  Location: [facility] ENDOSCOPY;  Service: Cardiovascular;  Laterality: N/A;   CARDIOVERSION N/A 04/11/2016   Procedure: CARDIOVERSION;  Surgeon: Laurey Morale, MD;  Location: [facility] ENDOSCOPY;  Service: Cardiovascular;  Laterality: N/A;   CARDIOVERSION N/A 09/25/2018   Procedure: CARDIOVERSION;  Surgeon: Jodelle Red, MD;  Location: [facility] ENDOSCOPY;  Service: Cardiovascular;  Laterality: N/A;  COLONOSCOPY WITH PROPOFOL N/A 09/10/2018   Procedure: COLONOSCOPY WITH PROPOFOL;  Surgeon: Kathi Der, MD;  Location: [facility] ENDOSCOPY;  Service: Gastroenterology;  Laterality: N/A;   ELECTROPHYSIOLOGIC STUDY N/A 03/31/2016   Procedure: A-Flutter Ablation;  Surgeon: Marinus Maw, MD;  Location: [facility] INVASIVE CV LAB;  Service: Cardiovascular;  Laterality: N/A;   ESOPHAGOGASTRODUODENOSCOPY (EGD) WITH PROPOFOL N/A 09/10/2018   Procedure: ESOPHAGOGASTRODUODENOSCOPY (EGD) WITH PROPOFOL;  Surgeon: Kathi Der, MD;  Location: [facility] ENDOSCOPY;   Service: Gastroenterology;  Laterality: N/A;   EXCHANGE OF A DIALYSIS CATHETER Left 04/25/2018   Procedure: Attempted Exchange Of A Dialysis Catheter on the Left side;  Surgeon: Nada Libman, MD;  Location: [facility] OR;  Service: Vascular;  Laterality: Left;   IMPLANTABLE CARDIOVERTER DEFIBRILLATOR (ICD) GENERATOR CHANGE N/A 08/27/2013   Procedure: ICD GENERATOR CHANGE;  Surgeon: Marinus Maw, MD;  Location: [facility] CATH LAB;  Service: Cardiovascular;  Laterality: N/A;   INSERTION OF DIALYSIS CATHETER Right 04/25/2018   Procedure: INSERTION OF TUNNELED DIALYSIS CATHETER;  Surgeon: Nada Libman, MD;  Location[facility] OR;  Service: Vascular;  Laterality: Right;   POLYPECTOMY  09/10/2018   Procedure: POLYPECTOMY;  Surgeon: Kathi Der, MD;  Location: [facility] ENDOSCOPY;  Service: Gastroenterology;;   TEE WITHOUT CARDIOVERSION N/A 03/31/2016   Procedure: TRANSESOPHAGEAL ECHOCARDIOGRAM (TEE);  Surgeon: Laurey Morale, MD;  Location: [facility] ENDOSCOPY;  Service: Cardiovascular;  Laterality: N/A;     Family History  Problem Relation Age of Onset   Hypertension Mother    Heart disease Mother    Diabetes Mother      Social History   Socioeconomic History   Marital status: Widowed    Spouse name: Not on file   Number of children: Not on file   Years of education: Not on file   Highest education level: Not on file  Occupational History   Not on file  Tobacco Use   Smoking status: Never   Smokeless tobacco: Never   Tobacco comments:    pt denies smoking cigars  Vaping Use   Vaping status: Never Used  Substance and Sexual Activity   Alcohol use: No   Drug use: No   Sexual activity: Not Currently  Other Topics Concern   Not on file  Social History Narrative   Not on file   Social Determinants of Health   Financial Resource Strain: Low Risk  (10/24/2022)   Received from [facility]   Overall Financial Resource Strain (CARDIA)    Difficulty of Paying Living Expenses: Not hard  at all  Food Insecurity: No Food Insecurity (10/24/2022)   Received [facility]   Hunger Vital Sign    Worried About Running Out of Food in the Last Year: Never true    Ran Out of Food in the Last Year: Never true  Recent Concern: Food Insecurity - Food Insecurity Present (10/20/2022)   Received from [facility]   Hunger Vital Sign    Worried About Running Out of Food in the Last Year: Sometimes true    Ran Out of Food in the Last Year: Sometimes true  Transportation Needs: No Transportation Needs (10/24/2022)   Received from [facility] - Transportation    In the past 12 months, has lack of transportation kept you from medical appointments or from getting medications?: No    Lack of Transportation (Non-Medical): No  Physical Activity: Not on file  Stress: Not on file  Social Connections:  Unknown (11/20/2018)   [facility]   Social Connection and Isolation Panel [NHANES]    Frequency of Communication with Friends and Family: More than three times a week    Frequency of Social Gatherings with Friends and Family: More than three times a week    Attends Religious Services: Patient declined    Active Member of Clubs or Organizations: Patient declined    Attends Banker Meetings: Patient declined    Marital Status: Widowed  Intimate Partner Violence: Unknown (11/20/2018)   [facility]   Humiliation, Afraid, Rape, and Kick questionnaire    Fear of Current or Ex-Partner: No    Emotionally Abused: No    Physically Abused: Not asked    Sexually Abused: No     BP (!) 80/56   Pulse 99   Ht 5\' 10"  (1.778 m)   Wt 248 lb 9.6 oz (112.8 kg)   SpO2 99%   BMI 35.67 kg/m   Physical Exam:  Well appearing NAD HEENT: Unremarkable Neck:  No JVD, no thyromegally Lymphatics:  No adenopathy Back:  No CVA tenderness Lungs:  Clear with no  wheezes HEART:  Regular rate rhythm, no murmurs, no rubs, no clicks Abd:  soft, positive bowel sounds, no organomegally, no rebound, no guarding Ext:  2 plus pulses, no edema, no cyanosis, no clubbing Skin:  No rashes no nodules Neuro:  CN II through XII intact, motor grossly intact  EKG - nsr with LBBB  DEVICE  Normal device function.  See PaceArt for details.   Assess/Plan: Chronic systolic heart failure - his symptoms are class 2. He will continue GDMT. He is being evaluated for renal and cardiac transplant. If this does not happen then we will try to upgrade him. ICD - he is about a year away from gen change. If his symptoms do not worsen, I would be inclined to attempt an upgrade at the time of gen change.  Atrial flutter - none since his ablation. Atrial fib - he is maintaining NSR on amiodarone.   Sharlot Gowda Kyon Bentler,MD

## 2023-02-20 NOTE — Patient Instructions (Signed)
Description   Continue taking Warfarin 1 tablet daily except 1/2 tablet on Wednesdays.  Recheck INR in 6 weeks. Please call [facility] Clinic with any changes in medications. [phone] OR [phone]

## 2023-03-20 ENCOUNTER — Telehealth (HOSPITAL_COMMUNITY): Payer: Self-pay

## 2023-03-20 NOTE — Telephone Encounter (Signed)
  [facility]ANCED [facility] FAILURE [facility]   Pre-operative Risk Assessment       Request for Surgical Clearance    Procedure:   Colonoscopy for Pretransplant Protocol {  Date of Surgery:  Clearance TBD                              { Surgeon:  Dr. Elsie Miu  Surgeon's Group or Practice Name:  [facility] : [facility]  Phone number:  [phone] Fax number:  [phone]   Type of Clearance Requested:   - Medical  - Pharmacy:  Hold Warfarin (Coumadin )   {  Type of Anesthesia:   not listed  { Additional requests/questions:  Please fax a copy of clearance/coumadin  instructions to the surgeon's office.  Signed, Juvenal Umar B Givanni Staron   03/20/2023, 9:08 AM

## 2023-03-20 NOTE — Telephone Encounter (Signed)
 Ok to hold warfarin for colonoscopy as long as no change in symptoms since his 9/24 appointment.

## 2023-03-20 NOTE — Telephone Encounter (Signed)
 Copy of clearance faxed to requesting provider via Epic fax function

## 2023-04-03 ENCOUNTER — Ambulatory Visit: Payer: 59

## 2023-05-15 ENCOUNTER — Ambulatory Visit: Payer: 59

## 2023-07-11 ENCOUNTER — Telehealth (HOSPITAL_COMMUNITY): Payer: Self-pay

## 2023-07-11 ENCOUNTER — Telehealth (HOSPITAL_COMMUNITY): Payer: Self-pay | Admitting: *Deleted

## 2023-07-11 NOTE — Telephone Encounter (Signed)
 Called to confirm/remind patient of their appointment at the [facility] on 06/15/23.    Appointment:              [] Confirmed             [x] Left mess              [] No answer/No voice mail             [] Phone not in service   Patient reminded to bring all medications and/or complete list.   Confirmed patient has transportation. Gave directions, instructed to utilize valet parking.

## 2023-07-11 NOTE — Telephone Encounter (Signed)
 Called to confirm/remind patient of their appointment at the [facility] on 07/12/23.   Appointment:   [] Confirmed  [x] Left mess   [] No answer/No voice mail  [] VM Full/unable to leave message  [] Phone not in service  And to bring in all medications and/or complete list.

## 2023-07-12 ENCOUNTER — Encounter (HOSPITAL_COMMUNITY): Payer: 59

## 2023-08-14 ENCOUNTER — Ambulatory Visit: Payer: 59

## 2023-08-15 ENCOUNTER — Telehealth: Payer: Self-pay

## 2023-08-15 NOTE — Telephone Encounter (Signed)
 Patient is indicated as expired in [place]; however, I do not see any documentation in EPIC.  Appears patient was being prepped for renal/heart transplant the first part of this year and then I can't find any further records.   Can you see if patient has passed away and notify HIM, if appropriate?  Update website/Paceart as appropriate.  Thanks.

## 2023-08-16 NOTE — Telephone Encounter (Signed)
 Looks like pt has passed away from the previous note for labs. Unable to reach anyone to confirm this, will mark I in paceart until further notice.

## 2023-11-13 ENCOUNTER — Ambulatory Visit: Payer: 59

## 2024-02-12 ENCOUNTER — Ambulatory Visit: Payer: 59
